# Patient Record
Sex: Male | Born: 1955 | Race: White | Hispanic: No | Marital: Single | Smoking: Former smoker
Health system: Southern US, Community
[De-identification: ages and names within clinical notes are randomized; demographics above are authoritative.]

## PROBLEM LIST (undated history)

## (undated) ENCOUNTER — Ambulatory Visit: Admission: EM | Payer: Medicare Other | Source: Home / Self Care

## (undated) ENCOUNTER — Emergency Department (HOSPITAL_COMMUNITY): Discharge status: Absent without leave | Payer: BC Managed Care – PPO

## (undated) DIAGNOSIS — J45909 Unspecified asthma, uncomplicated: Secondary | ICD-10-CM

## (undated) DIAGNOSIS — K602 Anal fissure, unspecified: Secondary | ICD-10-CM

## (undated) DIAGNOSIS — I509 Heart failure, unspecified: Secondary | ICD-10-CM

## (undated) DIAGNOSIS — E785 Hyperlipidemia, unspecified: Secondary | ICD-10-CM

## (undated) DIAGNOSIS — J40 Bronchitis, not specified as acute or chronic: Secondary | ICD-10-CM

## (undated) DIAGNOSIS — T7840XA Allergy, unspecified, initial encounter: Secondary | ICD-10-CM

## (undated) DIAGNOSIS — R0600 Dyspnea, unspecified: Secondary | ICD-10-CM

## (undated) DIAGNOSIS — J189 Pneumonia, unspecified organism: Secondary | ICD-10-CM

## (undated) DIAGNOSIS — D126 Benign neoplasm of colon, unspecified: Secondary | ICD-10-CM

## (undated) DIAGNOSIS — K219 Gastro-esophageal reflux disease without esophagitis: Secondary | ICD-10-CM

## (undated) DIAGNOSIS — I10 Essential (primary) hypertension: Secondary | ICD-10-CM

## (undated) DIAGNOSIS — M1990 Unspecified osteoarthritis, unspecified site: Secondary | ICD-10-CM

## (undated) DIAGNOSIS — Z9981 Dependence on supplemental oxygen: Secondary | ICD-10-CM

## (undated) DIAGNOSIS — Z950 Presence of cardiac pacemaker: Secondary | ICD-10-CM

## (undated) DIAGNOSIS — I428 Other cardiomyopathies: Secondary | ICD-10-CM

## (undated) DIAGNOSIS — J439 Emphysema, unspecified: Secondary | ICD-10-CM

## (undated) DIAGNOSIS — G4733 Obstructive sleep apnea (adult) (pediatric): Secondary | ICD-10-CM

## (undated) DIAGNOSIS — J449 Chronic obstructive pulmonary disease, unspecified: Secondary | ICD-10-CM

## (undated) DIAGNOSIS — Z8719 Personal history of other diseases of the digestive system: Secondary | ICD-10-CM

## (undated) DIAGNOSIS — J329 Chronic sinusitis, unspecified: Secondary | ICD-10-CM

## (undated) DIAGNOSIS — R0602 Shortness of breath: Secondary | ICD-10-CM

## (undated) HISTORY — DX: Unspecified asthma, uncomplicated: J45.909

## (undated) HISTORY — DX: Benign neoplasm of colon, unspecified: D12.6

## (undated) HISTORY — DX: Pneumonia, unspecified organism: J18.9

## (undated) HISTORY — DX: Shortness of breath: R06.02

## (undated) HISTORY — DX: Chronic sinusitis, unspecified: J32.9

## (undated) HISTORY — DX: Emphysema, unspecified: J43.9

## (undated) HISTORY — DX: Essential (primary) hypertension: I10

## (undated) HISTORY — DX: Obstructive sleep apnea (adult) (pediatric): G47.33

## (undated) HISTORY — PX: TONSILLECTOMY: SUR1361

## (undated) HISTORY — DX: Hyperlipidemia, unspecified: E78.5

## (undated) HISTORY — DX: Bronchitis, not specified as acute or chronic: J40

## (undated) HISTORY — DX: Chronic obstructive pulmonary disease, unspecified: J44.9

## (undated) HISTORY — DX: Anal fissure, unspecified: K60.2

## (undated) HISTORY — DX: Allergy, unspecified, initial encounter: T78.40XA

## (undated) HISTORY — DX: Gastro-esophageal reflux disease without esophagitis: K21.9

---

## 1999-05-03 ENCOUNTER — Inpatient Hospital Stay (HOSPITAL_COMMUNITY): Admission: AD | Admit: 1999-05-03 | Discharge: 1999-05-04 | Payer: Self-pay | Admitting: Cardiovascular Disease

## 1999-05-04 ENCOUNTER — Encounter: Payer: Self-pay | Admitting: Cardiovascular Disease

## 2002-02-20 ENCOUNTER — Ambulatory Visit (HOSPITAL_COMMUNITY): Admission: RE | Admit: 2002-02-20 | Discharge: 2002-02-20 | Payer: Self-pay | Admitting: Family Medicine

## 2002-02-20 ENCOUNTER — Encounter: Payer: Self-pay | Admitting: Family Medicine

## 2003-02-22 ENCOUNTER — Encounter: Payer: Self-pay | Admitting: Emergency Medicine

## 2003-02-22 ENCOUNTER — Emergency Department (HOSPITAL_COMMUNITY): Admission: EM | Admit: 2003-02-22 | Discharge: 2003-02-22 | Payer: Self-pay | Admitting: Emergency Medicine

## 2003-09-28 ENCOUNTER — Emergency Department (HOSPITAL_COMMUNITY): Admission: EM | Admit: 2003-09-28 | Discharge: 2003-09-28 | Payer: Self-pay | Admitting: Internal Medicine

## 2004-03-15 ENCOUNTER — Emergency Department (HOSPITAL_COMMUNITY): Admission: AD | Admit: 2004-03-15 | Discharge: 2004-03-15 | Payer: Self-pay | Admitting: Family Medicine

## 2005-06-20 ENCOUNTER — Emergency Department (HOSPITAL_COMMUNITY): Admission: EM | Admit: 2005-06-20 | Discharge: 2005-06-20 | Payer: Self-pay | Admitting: *Deleted

## 2007-12-15 ENCOUNTER — Emergency Department (HOSPITAL_COMMUNITY): Admission: EM | Admit: 2007-12-15 | Discharge: 2007-12-15 | Payer: Self-pay | Admitting: Emergency Medicine

## 2007-12-27 ENCOUNTER — Emergency Department (HOSPITAL_COMMUNITY): Admission: EM | Admit: 2007-12-27 | Discharge: 2007-12-27 | Payer: Self-pay | Admitting: Family Medicine

## 2008-01-19 ENCOUNTER — Emergency Department (HOSPITAL_COMMUNITY): Admission: EM | Admit: 2008-01-19 | Discharge: 2008-01-19 | Payer: Self-pay | Admitting: Family Medicine

## 2008-05-22 ENCOUNTER — Emergency Department (HOSPITAL_COMMUNITY): Admission: EM | Admit: 2008-05-22 | Discharge: 2008-05-22 | Payer: Self-pay | Admitting: Emergency Medicine

## 2008-06-01 ENCOUNTER — Emergency Department (HOSPITAL_COMMUNITY): Admission: EM | Admit: 2008-06-01 | Discharge: 2008-06-01 | Payer: Self-pay | Admitting: Emergency Medicine

## 2008-11-07 ENCOUNTER — Emergency Department (HOSPITAL_COMMUNITY): Admission: EM | Admit: 2008-11-07 | Discharge: 2008-11-07 | Payer: Self-pay | Admitting: Family Medicine

## 2009-05-13 ENCOUNTER — Emergency Department (HOSPITAL_COMMUNITY): Admission: EM | Admit: 2009-05-13 | Discharge: 2009-05-13 | Payer: Self-pay | Admitting: Family Medicine

## 2010-08-19 ENCOUNTER — Emergency Department (HOSPITAL_COMMUNITY): Admission: EM | Admit: 2010-08-19 | Discharge: 2010-08-19 | Payer: Self-pay | Admitting: Family Medicine

## 2011-07-01 ENCOUNTER — Ambulatory Visit (INDEPENDENT_AMBULATORY_CARE_PROVIDER_SITE_OTHER): Payer: Self-pay

## 2011-07-01 ENCOUNTER — Inpatient Hospital Stay (INDEPENDENT_AMBULATORY_CARE_PROVIDER_SITE_OTHER)
Admission: RE | Admit: 2011-07-01 | Discharge: 2011-07-01 | Disposition: A | Discharge status: Home / Self Care | Payer: Self-pay | Source: Ambulatory Visit | Attending: Family Medicine | Admitting: Family Medicine

## 2011-07-01 DIAGNOSIS — J449 Chronic obstructive pulmonary disease, unspecified: Secondary | ICD-10-CM

## 2011-07-01 DIAGNOSIS — J40 Bronchitis, not specified as acute or chronic: Secondary | ICD-10-CM

## 2011-07-01 LAB — POCT I-STAT, CHEM 8
BUN: 9 mg/dL (ref 6–23)
Calcium, Ion: 1.1 mmol/L — ABNORMAL LOW (ref 1.12–1.32)
Chloride: 103 mEq/L (ref 96–112)
Creatinine, Ser: 0.7 mg/dL (ref 0.50–1.35)
Glucose, Bld: 105 mg/dL — ABNORMAL HIGH (ref 70–99)
HCT: 52 % (ref 39.0–52.0)
Hemoglobin: 17.7 g/dL — ABNORMAL HIGH (ref 13.0–17.0)
Potassium: 3.6 mEq/L (ref 3.5–5.1)
Sodium: 139 mEq/L (ref 135–145)
TCO2: 27 mmol/L (ref 0–100)

## 2011-07-22 ENCOUNTER — Encounter: Payer: Self-pay | Admitting: Cardiovascular Disease

## 2011-07-23 ENCOUNTER — Ambulatory Visit (INDEPENDENT_AMBULATORY_CARE_PROVIDER_SITE_OTHER): Payer: Self-pay | Admitting: Cardiovascular Disease

## 2011-07-23 ENCOUNTER — Encounter: Payer: Self-pay | Admitting: Cardiovascular Disease

## 2011-07-23 DIAGNOSIS — R079 Chest pain, unspecified: Secondary | ICD-10-CM

## 2011-07-23 DIAGNOSIS — R0989 Other specified symptoms and signs involving the circulatory and respiratory systems: Secondary | ICD-10-CM

## 2011-07-23 DIAGNOSIS — F17200 Nicotine dependence, unspecified, uncomplicated: Secondary | ICD-10-CM

## 2011-07-23 DIAGNOSIS — R0600 Dyspnea, unspecified: Secondary | ICD-10-CM

## 2011-07-23 DIAGNOSIS — J45909 Unspecified asthma, uncomplicated: Secondary | ICD-10-CM | POA: Insufficient documentation

## 2011-07-23 DIAGNOSIS — I10 Essential (primary) hypertension: Secondary | ICD-10-CM

## 2011-07-23 MED ORDER — VARENICLINE TARTRATE 1 MG PO TABS: ORAL_TABLET | ORAL | Status: DC

## 2011-07-23 NOTE — Assessment & Plan Note (Signed)
Well controlled.  Continue current medications and low sodium Dash type diet.    

## 2011-07-23 NOTE — Patient Instructions (Signed)
Your physician recommends that you schedule a follow-up appointment in: . 6 MONTHS WITH DR [facility]  Your physician recommends that you continue on your current medications as directed. Please refer to the Current Medication list given to you today.  Your physician has requested that you have a lexiscan myoview. For further information please visit [email or web address] Please follow instruction sheet, as given. ALL AT PT'S CONVENIENCE  Your physician has requested that you have an echocardiogram. Echocardiography is a painless test that uses sound waves to create images of your heart. It provides your doctor with information about the size and shape of your heart and how well your heart's chambers and valves are working. This procedure takes approximately one hour. There are no restrictions for this procedure.  Your physician has recommended that you have a pulmonary function test. Pulmonary Function Tests are a group of tests that measure how well air moves in and out of your lungs.  You have been referred to PULMONARY DX COPD     AND DR Dwana Melena [place]  PMD

## 2011-07-23 NOTE — Assessment & Plan Note (Addendum)
COPD, needs PFT;s and pulmonary F/U.  Worrisome in regard to polycythemia.  Check echo for RV/LV function

## 2011-07-23 NOTE — Assessment & Plan Note (Signed)
Script for Chantix given  He will fill it at pharmacy if not too expensive

## 2011-07-23 NOTE — Progress Notes (Signed)
55 yo referred by Dr Day for SSCP, and dyspnea.  Reviewed urgent care notes CDU.  R/O no stress test done.  Unable to use treadmill due to knee problems.  SSCP last few months but mostly related to dyspnea and tightness from COPD.  Still smoking at least a ppd.  Needs pulmonary referral.  Had some relief in ER with nebulizer.  Counseled for more than 10 minutes on cessation.  Has had severe headache with welbutrin in past.  Will take script for Chantix but may not be able to afford it due to no insurance.  Has some assistance through [facility] with "[employer]" card  Had myovue in 20 that was ok.  No palpitatons, syncope,  Mild cough.  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 Noted to be polycythemic in ER with Hct >50.  CXR NAD no CA  ROS: Denies fever, malais, weight loss, blurry vision, decreased visual acuity, cough, sputum, SOB, hemoptysis, pleuritic pain, palpitaitons, heartburn, abdominal pain, melena, lower extremity edema, claudication, or rash.  All other systems reviewed and negative  General: Affect appropriate Healthy:  appears stated age HEENT: normal Neck supple with no adenopathy JVP normal no bruits no thyromegaly Lungs clear with no wheezing and good diaphragmatic motion Heart:  S1/S2 no murmur,rub, gallop or click PMI normal Abdomen: benighn, BS positve, no tenderness, no AAA no bruit.  No HSM or HJR Distal pulses intact with no bruits No edema Neuro non-focal Skin warm and dry No muscular weakness   Current Outpatient Prescriptions  Medication Sig Dispense Refill  . albuterol (PROAIR HFA) 108 (90 BASE) MCG/ACT inhaler Inhale 2 puffs into the lungs every 6 (six) hours as needed.        Marland Kitchen amLODipine (NORVASC) 10 MG tablet Take 10 mg by mouth daily.        Marland Kitchen DOXYCYCLINE HYCLATE PO Take 1 tablet by mouth daily.        . Fluticasone-Salmeterol (ADVAIR DISKUS) 100-50 MCG/DOSE AEPB Inhale 1 puff into the lungs every 12 (twelve) hours.        Marland Kitchen omeprazole (PRILOSEC) 20 MG  capsule Take 20 mg by mouth daily.        . varenicline (CHANTIX) 1 MG tablet AS DIRECTED  60 tablet  3    Allergies  E-mycin and Penicillins  Electrocardiogram:  NSR nonspecfic ST/T wave changes no RV strain  Assessment and Plan

## 2011-07-23 NOTE — Assessment & Plan Note (Signed)
Hard to differentiate form pulmonary symptoms.  Lexiscan myovue.  Cannot walk on treadmill due to knee pain

## 2011-08-10 ENCOUNTER — Ambulatory Visit (HOSPITAL_COMMUNITY): Payer: Self-pay | Attending: Cardiovascular Disease | Admitting: Radiology

## 2011-08-10 ENCOUNTER — Ambulatory Visit (HOSPITAL_BASED_OUTPATIENT_CLINIC_OR_DEPARTMENT_OTHER): Payer: Self-pay | Admitting: Radiology

## 2011-08-10 ENCOUNTER — Ambulatory Visit (INDEPENDENT_AMBULATORY_CARE_PROVIDER_SITE_OTHER): Payer: Self-pay | Admitting: Internal Medicine

## 2011-08-10 DIAGNOSIS — R079 Chest pain, unspecified: Secondary | ICD-10-CM | POA: Insufficient documentation

## 2011-08-10 DIAGNOSIS — R0602 Shortness of breath: Secondary | ICD-10-CM

## 2011-08-10 DIAGNOSIS — R0609 Other forms of dyspnea: Secondary | ICD-10-CM | POA: Insufficient documentation

## 2011-08-10 DIAGNOSIS — R0600 Dyspnea, unspecified: Secondary | ICD-10-CM

## 2011-08-10 DIAGNOSIS — R0989 Other specified symptoms and signs involving the circulatory and respiratory systems: Secondary | ICD-10-CM

## 2011-08-10 DIAGNOSIS — R0789 Other chest pain: Secondary | ICD-10-CM

## 2011-08-10 LAB — PULMONARY FUNCTION TEST

## 2011-08-10 MED ORDER — REGADENOSON 0.4 MG/5ML IV SOLN
0.4000 mg | Freq: Once | INTRAVENOUS | Status: AC
  Administered 2011-08-10: 0.4 mg via INTRAVENOUS

## 2011-08-10 MED ORDER — TECHNETIUM TC 99M TETROFOSMIN IV KIT
33.0000 | PACK | Freq: Once | INTRAVENOUS | Status: AC | PRN
  Administered 2011-08-10: 33 via INTRAVENOUS

## 2011-08-10 MED ORDER — TECHNETIUM TC 99M TETROFOSMIN IV KIT
11.0000 | PACK | Freq: Once | INTRAVENOUS | Status: AC | PRN
  Administered 2011-08-10: 11 via INTRAVENOUS

## 2011-08-10 NOTE — Progress Notes (Signed)
[facility] SITE 3 NUCLEAR MED [address] [place] [address] [phone]  Cardiology Nuclear Med Study  Robert Church is a 55 y.o. male [id number] 28-Nov-1956   Nuclear Med Background Indication for Stress Test:  Evaluation for Ischemia History:  '94 MPS:OK per patient Cardiac Risk Factors: Hypertension and Smoker  Symptoms:  Chest Tightness with and without Exertion (last episode of chest discomfort was about 1-week ago), DOE/SOB and Fatigue    Nuclear Pre-Procedure Caffeine/Decaff Intake:  8:30pm NPO After: 8:30pm   Lungs:  Clear.  O2 sat 98% on RA. IV 0.9% NS with Angio Cath:  20g  IV Site: R Hand  IV Started by:  Cathlyn Parsons, RN  Chest Size (in):  46 Cup Size: n/a  Height: 5\' 10"  (1.778 m)  Weight:  203 lb (92.08 kg)  BMI:  Body mass index is 29.13 kg/(m^2). Tech Comments:  Patient used ProAir @ 7:30 a.m., lungs clear and O2sat 100%    Nuclear Med Study 1 or 2 day study: 1 day  Stress Test Type:  Treadmill/Lexiscan  [place] MD: Kristeen Miss, MD  Order Authorizing Provider:  Charlton Haws, MD  Resting Radionuclide: Technetium 15m Tetrofosmin  Resting Radionuclide Dose: 11.0 mCi   Stress Radionuclide:  Technetium 42m Tetrofosmin  Stress Radionuclide Dose: 33.0 mCi           Stress Protocol Rest HR: 75 Stress HR: 118  Rest BP: 136/99 Stress BP: 154/75  Exercise Time (min): 2:00 METS: n/a   Predicted Max HR: 168 bpm % Max HR: 70.24 bpm Rate Pressure Product: 41324   Dose of Adenosine (mg):  n/a Dose of Lexiscan: 0.4 mg  Dose of Atropine (mg): n/a Dose of Dobutamine: n/a mcg/kg/min (at max HR)  Stress Test Technologist: Smiley Houseman, CMA-N  Nuclear Technologist:  Doyne Keel, CNMT     Rest Procedure:  Myocardial perfusion imaging was performed at rest 45 minutes following the intravenous administration of Technetium 53m Tetrofosmin.  Rest ECG: No acute changes.  Stress Procedure:  The patient received IV Lexiscan 0.4 mg over  15-seconds with concurrent low level exercise and then Technetium 23m Tetrofosmin was injected at 30-seconds while the patient continued walking one more minute.  There were no significant changes with Lexiscan, isolated PVC.  Quantitative spect images were obtained after a 45-minute delay.  Stress ECG: No significant change from baseline ECG  QPS Raw Data Images:  Normal; no motion artifact; normal heart/lung ratio. Stress Images:  There is a small area of severe attenuation of the apex with normal perfusion in the other aspects of the LV. Rest Images:  There is a very small area of moderate attenuaion of the apex with normal uptake in the other areas of the LV. Subtraction (SDS):  There is small area of fixed attenuation of the apex with a small area of redistribution surrounding this area of fixed attenuation. (SDS =6) Transient Ischemic Dilatation (Normal <1.22):  0.89 Lung/Heart Ratio (Normal <0.45):  0.31  Quantitative Gated Spect Images QGS EDV:  151 ml QGS ESV:  69 ml QGS cine images:  NL LV Function; NL Wall Motion.  In particular, the apex contracts normally QGS EF: 54%  Impression Exercise Capacity:  Lexiscan with low level exercise. BP Response:  Normal blood pressure response. Clinical Symptoms:  No chest pain. ECG Impression:  No significant ST segment change suggestive of ischemia. Comparison with Prior Nuclear Study: No images to compare  Overall Impression:  Abnormal stress nuclear study.  There  is appears to be an apical infarct with a small area of peri-infarct ischemia.  However, the LV function is normal and the apex contracts normally which suggests that this is not an infarct.  I cannot exclude the possibility of apical thinning to explain the defect since the data are somewhat conflicting.    Vesta Mixer, Montez Hageman., MD, [facility]

## 2011-08-10 NOTE — Progress Notes (Signed)
PFT done today. 

## 2011-08-11 ENCOUNTER — Encounter (HOSPITAL_COMMUNITY): Payer: Self-pay | Admitting: Cardiovascular Disease

## 2011-08-12 NOTE — Progress Notes (Signed)
pt aware of results Robert Church  

## 2011-08-17 ENCOUNTER — Other Ambulatory Visit: Payer: Self-pay

## 2011-08-17 ENCOUNTER — Ambulatory Visit (INDEPENDENT_AMBULATORY_CARE_PROVIDER_SITE_OTHER): Payer: Self-pay | Admitting: Emergency Medicine

## 2011-08-17 ENCOUNTER — Encounter: Payer: Self-pay | Admitting: Emergency Medicine

## 2011-08-17 VITALS — BP 120/84 | HR 90 | Temp 98.3°F | Ht 70.5 in | Wt 207.4 lb

## 2011-08-17 DIAGNOSIS — J449 Chronic obstructive pulmonary disease, unspecified: Secondary | ICD-10-CM

## 2011-08-17 NOTE — Patient Instructions (Signed)
We will Start Advair 250/50, 1 inhalation twice a day Use ProAir as needed Walking oximetry today Bloodwork today Follow up with Dr Delton Coombes in 1 month

## 2011-08-17 NOTE — Assessment & Plan Note (Signed)
Mild AFL by PFT, positive BD response.  - probable COPD - will need to address tobacco cessation in more detail next time' - trial Advair - prn SABA - walking oximetry today - rov 1 month

## 2011-08-17 NOTE — Progress Notes (Signed)
Subjective:    Patient ID: Robert Church, male    DOB: 02-17-1956, 55 y.o.   MRN: [id number]  HPI 55 yo man, tobacco use ~38 yrs (>120 pk-yrs), also HTN. No hx asthma. Was evaluated by Dr Eden Emms and underwent cardiac stress testing that was reassuring.  Describes longstanding exertional SOB that has worsened over the last 2 yrs. He has occas cough. He does hear wheezing. He has had a SABA that he uses a few times a day. He has also been on Advair, symbicort before. He has been exposed to asbestos through [employer] on cars. He has had many exacerbations of apparent COPD, treated with pred/abx. Never hospitalized, never intubated.    Review of Systems  Constitutional: Positive for unexpected weight change. Negative for fever.  HENT: Positive for congestion. Negative for ear pain, nosebleeds, sore throat, rhinorrhea, sneezing, trouble swallowing, dental problem, postnasal drip and sinus pressure.   Eyes: Negative for redness and itching.  Respiratory: Positive for cough and shortness of breath. Negative for chest tightness and wheezing.   Cardiovascular: Negative for palpitations and leg swelling.  Gastrointestinal: Negative for nausea and vomiting.  Genitourinary: Negative for dysuria.  Musculoskeletal: Negative for joint swelling.  Skin: Negative for rash.  Neurological: Negative for headaches.  Hematological: Does not bruise/bleed easily.  Psychiatric/Behavioral: Positive for dysphoric mood. The patient is not nervous/anxious.    Past Medical History  Diagnosis Date  . COPD (chronic obstructive pulmonary disease)   . SOB (shortness of breath)   . Bronchitis   . GERD (gastroesophageal reflux disease)   . Hypertension   . Pneumonia   . Sinusitis      Family History  Problem Relation Age of Onset  . Lung cancer Father   . Brain cancer Father      History   Social History  . Marital Status: Married    Spouse Name: N/A    Number of Children: N/A  . Years of Education: N/A    Occupational History  . Not on file.   Social History Main Topics  . Smoking status: Current Everyday Smoker -- 0.3 packs/day for 40 years    Types: Cigarettes  . Smokeless tobacco: Never Used   Comment: previous smoked up to 5 ppd, average 1.5 ppd  . Alcohol Use: Yes     rare  . Drug Use: No  . Sexually Active: Not on file   Other Topics Concern  . Not on file   Social History Narrative  . No narrative on file     Allergies  Allergen Reactions  . E-Mycin (Erythromycin Base)   . Penicillins      Outpatient Prescriptions Prior to Visit  Medication Sig Dispense Refill  . albuterol (PROAIR HFA) 108 (90 BASE) MCG/ACT inhaler Inhale 2 puffs into the lungs every 6 (six) hours as needed.        Marland Kitchen amLODipine (NORVASC) 10 MG tablet Take 10 mg by mouth daily.        Marland Kitchen omeprazole (PRILOSEC) 20 MG capsule Take 20 mg by mouth daily.        Marland Kitchen DOXYCYCLINE HYCLATE PO Take 1 tablet by mouth daily.        . Fluticasone-Salmeterol (ADVAIR DISKUS) 100-50 MCG/DOSE AEPB Inhale 1 puff into the lungs every 12 (twelve) hours.        . varenicline (CHANTIX) 1 MG tablet AS DIRECTED  60 tablet  3        Objective:   Physical Exam  Gen: Pleasant,  well-nourished, in no distress,  normal affect  ENT: No lesions,  mouth clear,  Some exp stridor  Neck: No JVD, no TMG, no carotid bruits  Lungs: No use of accessory muscles, some referred UA noise and expiratory wheezes.   Cardiovascular: RRR, heart sounds normal, no murmur or gallops, no peripheral edema  Musculoskeletal: No deformities, no cyanosis or clubbing  Neuro: alert, non focal  Skin: Warm, no lesions or rashes     Assessment & Plan:  COPD (chronic obstructive pulmonary disease) Mild AFL by PFT, positive BD response.  - probable COPD - will need to address tobacco cessation in more detail next time' - trial Advair - prn SABA - walking oximetry today - rov 1 month

## 2011-08-18 ENCOUNTER — Encounter: Payer: Self-pay | Admitting: Cardiovascular Disease

## 2011-09-18 ENCOUNTER — Telehealth: Payer: Self-pay | Admitting: Emergency Medicine

## 2011-09-18 ENCOUNTER — Ambulatory Visit (INDEPENDENT_AMBULATORY_CARE_PROVIDER_SITE_OTHER): Payer: Self-pay | Admitting: Emergency Medicine

## 2011-09-18 ENCOUNTER — Encounter: Payer: Self-pay | Admitting: Emergency Medicine

## 2011-09-18 DIAGNOSIS — F17200 Nicotine dependence, unspecified, uncomplicated: Secondary | ICD-10-CM

## 2011-09-18 DIAGNOSIS — J449 Chronic obstructive pulmonary disease, unspecified: Secondary | ICD-10-CM

## 2011-09-18 NOTE — Progress Notes (Signed)
  Subjective:    Patient ID: Robert Church, male    DOB: 1956-05-12, 55 y.o.   MRN: [id number] HPI 55 yo man, tobacco use ~38 yrs (>120 pk-yrs), also HTN. No hx asthma. Was evaluated by Dr Eden Emms and underwent cardiac stress testing that was reassuring.  Describes longstanding exertional SOB that has worsened over the last 2 yrs. He has occas cough. He does hear wheezing. He has had a SABA that he uses a few times a day. He has also been on Advair, symbicort before. He has been exposed to asbestos through [employer] on cars. He has had many exacerbations of apparent COPD, treated with pred/abx. Never hospitalized, never intubated.   ROV 09/18/11 -- heavy tobacco hx, COPD/asthma based on PFTs. Last time initiated trial Advair. CXR from 06/2011 with hyperinflation.  He feels that the Advair may be helping but only a little bit, no real change.  Takes ProAir at least 3 x a day. Still smoking 1/2 pk a day.      Objective:   Physical Exam  Gen: Pleasant, well-nourished, in no distress,  normal affect  ENT: No lesions,  mouth clear,  Some exp stridor  Neck: No JVD, no TMG, no carotid bruits  Lungs: No use of accessory muscles, some referred UA noise and expiratory wheezes.   Cardiovascular: RRR, heart sounds normal, no murmur or gallops, no peripheral edema  Musculoskeletal: No deformities, no cyanosis or clubbing  Neuro: alert, non focal  Skin: Warm, no lesions or rashes     Assessment & Plan:  Smoking Trying to cut down.  He has not been able to tolerate chantix or wellbutrin. Has used patches in past. Not ready to set quit date at this time.   COPD (chronic obstructive pulmonary disease) Would continue Advair and prn SABA for now. Not clear to me that he is ready to quit cigarettes.  - continue same.

## 2011-09-18 NOTE — Assessment & Plan Note (Signed)
Would continue Advair and prn SABA for now. Not clear to me that he is ready to quit cigarettes.  - continue same.

## 2011-09-18 NOTE — Patient Instructions (Signed)
Please continue your Advair twice a day.  Use ProAir as needed.  We need to work on stopping smoking. Please call our office if you are ready and we can help you stop.  Follow with Dr Delton Coombes in 6 months or sooner if you have any problems.

## 2011-09-18 NOTE — Assessment & Plan Note (Signed)
Trying to cut down.  He has not been able to tolerate chantix or wellbutrin. Has used patches in past. Not ready to set quit date at this time.

## 2011-09-23 MED ORDER — FLUTICASONE-SALMETEROL 100-50 MCG/DOSE IN AEPB: 1.0000 | INHALATION_SPRAY | Freq: Two times a day (BID) | RESPIRATORY_TRACT | Status: DC

## 2011-09-23 NOTE — Telephone Encounter (Signed)
RX printed and awaiting RB signature so forms can be sent out.

## 2011-09-24 NOTE — Telephone Encounter (Signed)
Advair assistance forms given to [facility] along with RX and this will be sent out for the patient.

## 2011-10-25 ENCOUNTER — Emergency Department (HOSPITAL_COMMUNITY): Payer: Self-pay

## 2011-10-25 ENCOUNTER — Encounter (HOSPITAL_COMMUNITY): Payer: Self-pay | Admitting: Emergency Medicine

## 2011-10-25 ENCOUNTER — Emergency Department (HOSPITAL_COMMUNITY)
Admission: EM | Admit: 2011-10-25 | Discharge: 2011-10-25 | Disposition: A | Discharge status: Home / Self Care | Payer: Self-pay | Attending: Emergency Medicine | Admitting: Emergency Medicine

## 2011-10-25 DIAGNOSIS — J4489 Other specified chronic obstructive pulmonary disease: Secondary | ICD-10-CM | POA: Insufficient documentation

## 2011-10-25 DIAGNOSIS — M25569 Pain in unspecified knee: Secondary | ICD-10-CM | POA: Insufficient documentation

## 2011-10-25 DIAGNOSIS — I10 Essential (primary) hypertension: Secondary | ICD-10-CM | POA: Insufficient documentation

## 2011-10-25 DIAGNOSIS — M25461 Effusion, right knee: Secondary | ICD-10-CM

## 2011-10-25 DIAGNOSIS — K219 Gastro-esophageal reflux disease without esophagitis: Secondary | ICD-10-CM | POA: Insufficient documentation

## 2011-10-25 DIAGNOSIS — F17200 Nicotine dependence, unspecified, uncomplicated: Secondary | ICD-10-CM | POA: Insufficient documentation

## 2011-10-25 DIAGNOSIS — R609 Edema, unspecified: Secondary | ICD-10-CM | POA: Insufficient documentation

## 2011-10-25 DIAGNOSIS — J449 Chronic obstructive pulmonary disease, unspecified: Secondary | ICD-10-CM | POA: Insufficient documentation

## 2011-10-25 DIAGNOSIS — M25469 Effusion, unspecified knee: Secondary | ICD-10-CM | POA: Insufficient documentation

## 2011-10-25 DIAGNOSIS — Z79899 Other long term (current) drug therapy: Secondary | ICD-10-CM | POA: Insufficient documentation

## 2011-10-25 MED ORDER — HYDROCODONE-ACETAMINOPHEN 5-325 MG PO TABS: 1.0000 | ORAL_TABLET | ORAL | Status: AC | PRN

## 2011-10-25 MED ORDER — IBUPROFEN 800 MG PO TABS: 800.0000 mg | ORAL_TABLET | Freq: Three times a day (TID) | ORAL | Status: AC

## 2011-10-25 NOTE — ED Notes (Signed)
Pt returns from xray

## 2011-10-25 NOTE — ED Provider Notes (Signed)
History     CSN: [id number] Arrival date & time: 10/25/2011  9:30 AM   First MD Initiated Contact with Patient 10/25/11 [phone]      Chief Complaint  Patient presents with  . Knee Pain    (Consider location/radiation/quality/duration/timing/severity/associated sxs/prior treatment) Patient is a 55 y.o. male presenting with knee pain. The history is provided by the patient.  Knee Pain This is a new problem. The current episode started 1 to 4 weeks ago. The problem occurs constantly. The problem has been unchanged. Associated symptoms include arthralgias and joint swelling. Pertinent negatives include no fever, myalgias, nausea, numbness, rash or weakness. The symptoms are aggravated by bending and walking. He has tried NSAIDs and ice for the symptoms. The treatment provided mild relief.  He noted pain to his R knee about 2 weeks ago. No known injury to the knee; he denies any old injuries. The pain worsens with bending the knee and walking a lot. He has used a knee brace at home as well as ice with little to no relief. He is able to walk and bear wt.  Past Medical History  Diagnosis Date  . COPD (chronic obstructive pulmonary disease)   . SOB (shortness of breath)   . Bronchitis   . GERD (gastroesophageal reflux disease)   . Hypertension   . Pneumonia   . Sinusitis     Past Surgical History  Procedure Date  . Tonsillectomy     Family History  Problem Relation Age of Onset  . Lung cancer Father   . Brain cancer Father     History  Substance Use Topics  . Smoking status: Current Everyday Smoker -- 0.5 packs/day for 40 years    Types: Cigarettes  . Smokeless tobacco: Never Used   Comment: previous smoked up to 5 ppd, average 1.5 ppd  . Alcohol Use: Yes     rare      Review of Systems  Constitutional: Negative.  Negative for fever.  Gastrointestinal: Negative.  Negative for nausea.  Musculoskeletal: Positive for joint swelling and arthralgias. Negative for myalgias and  gait problem.  Skin: Negative.  Negative for rash.  Neurological: Negative.  Negative for weakness and numbness.  All other systems reviewed and are negative.    Allergies  E-mycin and Penicillins  Home Medications   Current Outpatient Rx  Name Route Sig Dispense Refill  . ALBUTEROL SULFATE HFA 108 (90 BASE) MCG/ACT IN AERS Inhalation Inhale 2 puffs into the lungs every 6 (six) hours as needed. For shortness of breath.    . AMLODIPINE BESYLATE 10 MG PO TABS Oral Take 10 mg by mouth daily.     Marland Kitchen FLUTICASONE-SALMETEROL 250-50 MCG/DOSE IN AEPB Inhalation Inhale 1 puff into the lungs every 12 (twelve) hours.      . OMEPRAZOLE 20 MG PO CPDR Oral Take 20 mg by mouth daily.     Marland Kitchen OVER THE COUNTER MEDICATION Nasal Place 3-4 sprays into the nose every morning. Saline Solution.     Marland Kitchen HYDROCODONE-ACETAMINOPHEN 5-325 MG PO TABS Oral Take 1-2 tablets by mouth every 4 (four) hours as needed for pain. 10 tablet 0  . IBUPROFEN 800 MG PO TABS Oral Take 1 tablet (800 mg total) by mouth 3 (three) times daily. 21 tablet 0    Pulse 86  Temp(Src) 98.3 F (36.8 C) (Oral)  Resp 20  SpO2 99%  Physical Exam  Constitutional: He is oriented to person, place, and time. He appears well-developed and well-nourished. No distress.  HENT:  Head: Normocephalic and atraumatic.  Right Ear: External ear normal.  Left Ear: External ear normal.  Nose: Nose normal.  Eyes: Conjunctivae are normal. Pupils are equal, round, and reactive to light.  Neck: Neck supple.  Musculoskeletal: He exhibits edema. He exhibits no tenderness.       Right knee: He exhibits decreased range of motion and effusion. He exhibits no ecchymosis, no deformity, no LCL laxity, no bony tenderness, normal meniscus and no MCL laxity. tenderness found. No medial joint line and no lateral joint line tenderness noted.       Legs:      Neg ant/post drawer testing  Neurological: He is alert and oriented to person, place, and time. He displays normal  reflexes. No cranial nerve deficit.  Skin: Skin is warm and dry. No rash noted. He is not diaphoretic.  Psychiatric: He has a normal mood and affect.    ED Course  Procedures (including critical care time)  Labs Reviewed - No data to display Dg Knee Complete 4 Views Right  10/25/2011  *RADIOLOGY REPORT*  Clinical Data: Knee pain.  Swelling.  RIGHT KNEE - COMPLETE 4+ VIEW  Comparison: None.  Findings: Mild degenerative changes in the right knee.  Possible trace joint effusion. No acute bony abnormality.  Specifically, no fracture, subluxation, or dislocation.  Soft tissues are intact. Small well corticated bony density noted along the medial joint surface which may reflect old injury.  IMPRESSION: Mild degenerative changes.  Trace joint effusion. No acute bony abnormality.  Original Report Authenticated By: Cyndie Chime, M.D.     1. Knee effusion, right       MDM  Pt with effusion which feels mostly suprapatellar; patella is not ballotable. I offered to drain the effusion, but pt declined. I encouraged him to rest and use heat to the area as well as f/u with ortho. He verbalized understanding and agreed to plan.        Grant Fontana, [place] 10/25/11 [phone]

## 2011-10-25 NOTE — ED Notes (Signed)
Pt fell 2w ago and has had pain since, mild swelling noted

## 2011-10-28 NOTE — ED Provider Notes (Signed)
Medical screening examination/treatment/procedure(s) were performed by non-physician practitioner and as supervising physician I was immediately available for consultation/collaboration.  Nicholes Stairs, MD 10/28/11 [phone]

## 2011-11-23 ENCOUNTER — Telehealth: Payer: Self-pay | Admitting: Emergency Medicine

## 2011-11-23 MED ORDER — ALBUTEROL SULFATE HFA 108 (90 BASE) MCG/ACT IN AERS: 2.0000 | INHALATION_SPRAY | Freq: Four times a day (QID) | RESPIRATORY_TRACT | Status: DC | PRN

## 2011-11-23 NOTE — Telephone Encounter (Signed)
LMOM informing pt rx sent to pharmacy.   

## 2011-12-03 ENCOUNTER — Emergency Department (INDEPENDENT_AMBULATORY_CARE_PROVIDER_SITE_OTHER): Payer: Self-pay

## 2011-12-03 ENCOUNTER — Emergency Department (INDEPENDENT_AMBULATORY_CARE_PROVIDER_SITE_OTHER)
Admission: EM | Admit: 2011-12-03 | Discharge: 2011-12-03 | Disposition: A | Discharge status: Home / Self Care | Payer: Self-pay | Source: Home / Self Care | Attending: Emergency Medicine | Admitting: Emergency Medicine

## 2011-12-03 ENCOUNTER — Encounter (HOSPITAL_COMMUNITY): Payer: Self-pay | Admitting: Emergency Medicine

## 2011-12-03 DIAGNOSIS — S61209A Unspecified open wound of unspecified finger without damage to nail, initial encounter: Secondary | ICD-10-CM

## 2011-12-03 DIAGNOSIS — Z23 Encounter for immunization: Secondary | ICD-10-CM

## 2011-12-03 DIAGNOSIS — S61219A Laceration without foreign body of unspecified finger without damage to nail, initial encounter: Secondary | ICD-10-CM

## 2011-12-03 MED ORDER — BACITRACIN 500 UNIT/GM EX OINT
1.0000 "application " | TOPICAL_OINTMENT | Freq: Once | CUTANEOUS | Status: AC
  Administered 2011-12-03: 1 via TOPICAL

## 2011-12-03 MED ORDER — TETANUS-DIPHTH-ACELL PERTUSSIS 5-2.5-18.5 LF-MCG/0.5 IM SUSP
0.5000 mL | Freq: Once | INTRAMUSCULAR | Status: AC
  Administered 2011-12-03: 0.5 mL via INTRAMUSCULAR

## 2011-12-03 MED ORDER — TETANUS-DIPHTH-ACELL PERTUSSIS 5-2.5-18.5 LF-MCG/0.5 IM SUSP
INTRAMUSCULAR | Status: AC
  Filled 2011-12-03: qty 0.5

## 2011-12-03 MED ORDER — HYDROCODONE-ACETAMINOPHEN 5-325 MG PO TABS: 2.0000 | ORAL_TABLET | ORAL | Status: AC | PRN

## 2011-12-03 NOTE — ED Notes (Signed)
Applied antibiotic ointment, dressing, kerlix, coban

## 2011-12-03 NOTE — ED Notes (Signed)
Patient not sure of when he recieved a tetanus.

## 2011-12-03 NOTE — ED Notes (Signed)
Right finger slammed in car door.  Incident occurred around 2:00 pm today.  Patient reports continued to bleed.  Numbness/tingling in fingertip.

## 2011-12-03 NOTE — ED Provider Notes (Signed)
History     CSN: [id number] Arrival date & time: 12/03/2011  7:49 PM   First MD Initiated Contact with Patient 12/03/11 1925      No chief complaint on file.   (Consider location/radiation/quality/duration/timing/severity/associated sxs/prior treatment) HPI Comments: Finger slammed in car door, have a cut on my R index finger around 2 pm bled a lot feel numbness and tingling sensation   Patient is a 55 y.o. male presenting with skin laceration. The history is provided by the patient.  Laceration  The incident occurred 1 to 2 hours ago. The laceration is 3 cm in size. The pain is at a severity of 5/10. The pain is moderate. The pain has been constant since onset. He reports no foreign bodies present.    Past Medical History  Diagnosis Date  . COPD (chronic obstructive pulmonary disease)   . SOB (shortness of breath)   . Bronchitis   . GERD (gastroesophageal reflux disease)   . Hypertension   . Pneumonia   . Sinusitis     Past Surgical History  Procedure Date  . Tonsillectomy     Family History  Problem Relation Age of Onset  . Lung cancer Father   . Brain cancer Father     History  Substance Use Topics  . Smoking status: Current Everyday Smoker -- 0.5 packs/day for 40 years    Types: Cigarettes  . Smokeless tobacco: Never Used   Comment: previous smoked up to 5 ppd, average 1.5 ppd  . Alcohol Use: Yes     rare      Review of Systems  Musculoskeletal:       Laceration  Neurological: Negative for weakness and numbness.  All other systems reviewed and are negative.    Allergies  E-mycin and Penicillins  Home Medications   Current Outpatient Rx  Name Route Sig Dispense Refill  . ALBUTEROL SULFATE HFA 108 (90 BASE) MCG/ACT IN AERS Inhalation Inhale 2 puffs into the lungs every 6 (six) hours as needed. For shortness of breath. 1 Inhaler 11  . AMLODIPINE BESYLATE 10 MG PO TABS Oral Take 10 mg by mouth daily.     Marland Kitchen FLUTICASONE-SALMETEROL 250-50 MCG/DOSE  IN AEPB Inhalation Inhale 1 puff into the lungs every 12 (twelve) hours.      . OMEPRAZOLE 20 MG PO CPDR Oral Take 20 mg by mouth daily.     Marland Kitchen OVER THE COUNTER MEDICATION Nasal Place 3-4 sprays into the nose every morning. Saline Solution.       BP 168/100  Pulse 82  Temp(Src) 98.1 F (36.7 C) (Oral)  Resp 16  SpO2 98%  Physical Exam  Nursing note and vitals reviewed. Constitutional: He appears well-developed.  HENT:  Head: Normocephalic.  Musculoskeletal: He exhibits tenderness.       Right hand: He exhibits tenderness, laceration and swelling. He exhibits normal range of motion, normal two-point discrimination and normal capillary refill. normal sensation noted. Normal strength noted.       Hands:   ED Course  LACERATION REPAIR Date/Time: 12/03/2011 8:30 PM Performed by: Kylil Swopes Authorized by: Jimmie Molly Consent: Verbal consent obtained. Consent given by: patient Patient understanding: patient states understanding of the procedure being performed Patient identity confirmed: verbally with patient Laceration length: 3 cm Contamination: The wound is contaminated. Tendon involvement: none Nerve involvement: superficial Vascular damage: no Anesthesia: local infiltration Local anesthetic: lidocaine 1% without epinephrine Anesthetic total: 5 ml Amount of cleaning: standard Debridement: minimal Skin closure: 4-0 Prolene Technique: vertical mattress  Approximation: close Approximation difficulty: simple Dressing: antibiotic ointment   (including critical care time)  Labs Reviewed - No data to display No results found.   No diagnosis found.    MDM  Laceration palmar surface 3 cm- able to flex DIP        Jimmie Molly, MD 12/03/11 2033

## 2011-12-10 ENCOUNTER — Telehealth: Payer: Self-pay | Admitting: Emergency Medicine

## 2011-12-10 NOTE — Telephone Encounter (Signed)
I spoke with pt and he c/o nasal congestion, PND, little cough w/ clear phlem, wheezing, sore throat from PND x 2 days. Pt denies any fever, nausea, vomiting. Pt has tried Catering manager and vicks sinus. Pt is requesting that Dr. Delton Coombes call him something in. Please advise, thanks  Allergies  Allergen Reactions  . E-Mycin (Erythromycin Base) Swelling  . Penicillins Other (See Comments)    Pt took as a child and dr told him if he took again it would kill him.

## 2011-12-10 NOTE — Telephone Encounter (Signed)
This sounds like a viral URI as opposed to bacterial sinus dz - he should use OTC cold meds that include decongestants, start nasal saline washes daily if he is willing/able to do them. Will probably last 7-10 days. If he changes, worsens then he should call, probably be seen at OV.

## 2011-12-10 NOTE — Telephone Encounter (Signed)
Spoke with pt and notified of recs per RB.  He verbalized understanding and denied any questions.

## 2011-12-16 ENCOUNTER — Emergency Department (INDEPENDENT_AMBULATORY_CARE_PROVIDER_SITE_OTHER)
Admission: EM | Admit: 2011-12-16 | Discharge: 2011-12-16 | Disposition: A | Discharge status: Home / Self Care | Payer: Self-pay | Source: Home / Self Care

## 2011-12-16 ENCOUNTER — Encounter (HOSPITAL_COMMUNITY): Payer: Self-pay | Admitting: Emergency Medicine

## 2011-12-16 DIAGNOSIS — J441 Chronic obstructive pulmonary disease with (acute) exacerbation: Secondary | ICD-10-CM

## 2011-12-16 DIAGNOSIS — I10 Essential (primary) hypertension: Secondary | ICD-10-CM

## 2011-12-16 MED ORDER — IPRATROPIUM BROMIDE 0.02 % IN SOLN
0.5000 mg | Freq: Once | RESPIRATORY_TRACT | Status: AC
  Administered 2011-12-16: 0.5 mg via RESPIRATORY_TRACT

## 2011-12-16 MED ORDER — AMLODIPINE BESYLATE 10 MG PO TABS: 10.0000 mg | ORAL_TABLET | Freq: Every day | ORAL | Status: DC

## 2011-12-16 MED ORDER — ALBUTEROL SULFATE (5 MG/ML) 0.5% IN NEBU
5.0000 mg | INHALATION_SOLUTION | Freq: Once | RESPIRATORY_TRACT | Status: AC
  Administered 2011-12-16: 5 mg via RESPIRATORY_TRACT

## 2011-12-16 MED ORDER — ALBUTEROL SULFATE (5 MG/ML) 0.5% IN NEBU
INHALATION_SOLUTION | RESPIRATORY_TRACT | Status: AC
  Filled 2011-12-16: qty 1

## 2011-12-16 NOTE — ED Notes (Signed)
PT HERE WITH BRONCHITIS FLARE UP THAT STARTED X 4 DYS AGO WITH CHEST CONGESTION,COUGHING MAINLY IN AM CLEARED WITH PROD COUGH AND WHEEZING.PT HAS HX COPD,BRONCHITIS.LAST USED PRO-AIR,ADVAIR @ 7AM.DENIES SOB OR FEVERS.EXP WHEEZING THROUGHOUT

## 2011-12-16 NOTE — ED Provider Notes (Signed)
History     CSN: [id number]  Arrival date & time 12/16/11  5784   None     Chief Complaint  Patient presents with  . Bronchitis    (Consider location/radiation/quality/duration/timing/severity/associated sxs/prior treatment) HPI Comments: Pt presents with c/o cough, wheezing and dyspnea x 4 days. He has a hx of COPD. He states he gets bronchitis like this every yr. He has been on Advair for approx 4 mos now and is using Albuterol prn. Last used albuterol MDI  2 1/2 hrs ago. No fever or chills. Has chronic year round nasal congesion - unchanged. He also states he ran out of his BP medication a few days ago. He does no longer has a PCP so in unable to get refills. He recently had a cardiac evaluation by Dr Buzzy Han but does not have a follow up .    Past Medical History  Diagnosis Date  . COPD (chronic obstructive pulmonary disease)   . SOB (shortness of breath)   . Bronchitis   . GERD (gastroesophageal reflux disease)   . Hypertension   . Pneumonia   . Sinusitis     Past Surgical History  Procedure Date  . Tonsillectomy     Family History  Problem Relation Age of Onset  . Lung cancer Father   . Brain cancer Father     History  Substance Use Topics  . Smoking status: Current Everyday Smoker -- 0.5 packs/day for 40 years    Types: Cigarettes  . Smokeless tobacco: Never Used   Comment: previous smoked up to 5 ppd, average 1.5 ppd  . Alcohol Use: No     rare      Review of Systems  Constitutional: Negative for fever, chills and fatigue.  HENT: Positive for congestion. Negative for ear pain, sore throat, rhinorrhea, sneezing, postnasal drip and sinus pressure.   Respiratory: Positive for cough, shortness of breath and wheezing.   Cardiovascular: Negative for chest pain and palpitations.  Neurological: Negative for dizziness and headaches.    Allergies  E-mycin and Penicillins  Home Medications   Current Outpatient Rx  Name Route Sig Dispense Refill  .  ALBUTEROL SULFATE HFA 108 (90 BASE) MCG/ACT IN AERS Inhalation Inhale 2 puffs into the lungs every 6 (six) hours as needed. For shortness of breath. 1 Inhaler 11  . FLUTICASONE-SALMETEROL 250-50 MCG/DOSE IN AEPB Inhalation Inhale 1 puff into the lungs every 12 (twelve) hours.      Marland Kitchen OVER THE COUNTER MEDICATION Nasal Place 3-4 sprays into the nose every morning. Saline Solution.     . AMLODIPINE BESYLATE 10 MG PO TABS Oral Take 1 tablet (10 mg total) by mouth daily. RAN OUT X 1 WEEK.BP 167/122 30 tablet 0  . OMEPRAZOLE 20 MG PO CPDR Oral Take 20 mg by mouth daily.       BP 167/122  Pulse 84  Temp(Src) 98.4 F (36.9 C) (Oral)  Resp 17  SpO2 97%  Physical Exam  Nursing note and vitals reviewed. Constitutional: He appears well-developed and well-nourished. No distress.  HENT:  Head: Normocephalic and atraumatic.  Right Ear: Tympanic membrane, external ear and ear canal normal.  Left Ear: Tympanic membrane, external ear and ear canal normal.  Nose: Nose normal.  Mouth/Throat: Uvula is midline, oropharynx is clear and moist and mucous membranes are normal. No oropharyngeal exudate, posterior oropharyngeal edema or posterior oropharyngeal erythema.  Neck: Neck supple.  Cardiovascular: Normal rate, regular rhythm and normal heart sounds.   Pulmonary/Chest: Effort normal. No  respiratory distress. He has decreased breath sounds (bilat). He has wheezes (bilat). He has no rhonchi. He has no rales.  Lymphadenopathy:    He has no cervical adenopathy.  Neurological: He is alert.  Skin: Skin is warm and dry.  Psychiatric: He has a normal mood and affect.    ED Course  Procedures (including critical care time)  Labs Reviewed - No data to display No results found.   1. Acute exacerbation of chronic obstructive pulmonary disease (COPD)   2. Hypertension       MDM  After NMT pt reports symptomatic improvement and lungs CTA.  Discussed with pt need to obtain PCP for HTN mgmt . One mos rx  refill given.         Melody Comas, [place] 12/16/11 1946

## 2011-12-17 NOTE — ED Provider Notes (Signed)
Medical screening examination/treatment/procedure(s) were performed by non-physician practitioner and as supervising physician I was immediately available for consultation/collaboration.  Raynald Blend, MD 12/17/11 2020

## 2012-01-04 ENCOUNTER — Ambulatory Visit (INDEPENDENT_AMBULATORY_CARE_PROVIDER_SITE_OTHER): Payer: Self-pay

## 2012-01-04 DIAGNOSIS — R059 Cough, unspecified: Secondary | ICD-10-CM

## 2012-01-04 DIAGNOSIS — J441 Chronic obstructive pulmonary disease with (acute) exacerbation: Secondary | ICD-10-CM

## 2012-01-04 DIAGNOSIS — G4700 Insomnia, unspecified: Secondary | ICD-10-CM

## 2012-01-04 DIAGNOSIS — R05 Cough: Secondary | ICD-10-CM

## 2012-01-04 DIAGNOSIS — I10 Essential (primary) hypertension: Secondary | ICD-10-CM

## 2012-02-07 ENCOUNTER — Ambulatory Visit: Payer: Self-pay

## 2012-02-08 ENCOUNTER — Ambulatory Visit (INDEPENDENT_AMBULATORY_CARE_PROVIDER_SITE_OTHER): Payer: Self-pay | Admitting: Emergency Medicine

## 2012-02-08 ENCOUNTER — Encounter: Payer: Self-pay | Admitting: Emergency Medicine

## 2012-02-08 DIAGNOSIS — F17200 Nicotine dependence, unspecified, uncomplicated: Secondary | ICD-10-CM

## 2012-02-08 DIAGNOSIS — J449 Chronic obstructive pulmonary disease, unspecified: Secondary | ICD-10-CM

## 2012-02-08 NOTE — Assessment & Plan Note (Signed)
Down to 5 cig/day, discussed cessation, will need to revisit next time

## 2012-02-08 NOTE — Assessment & Plan Note (Addendum)
Continue Advair bid Start Spiriva x 1 month; he will call to let me know if beneficial. If so then we will order Need to try to get financial assist for ProAir and Spiriva, he already has for Advair Doesn't think that mucinex is helpful

## 2012-02-08 NOTE — Progress Notes (Signed)
  Subjective:    Patient ID: Robert Church, male    DOB: [birth year], 56 y.o.   MRN: [id number] HPI 56 yo man, tobacco use ~38 yrs (>120 pk-yrs), also HTN. No hx asthma. Was evaluated by Dr Eden Emms and underwent cardiac stress testing that was reassuring.  Describes longstanding exertional SOB that has worsened over the last 2 yrs. He has occas cough. He does hear wheezing. He has had a SABA that he uses a few times a day. He has also been on Advair, symbicort before. He has been exposed to asbestos through [employer] on cars. He has had many exacerbations of apparent COPD, treated with pred/abx. Never hospitalized, never intubated.   ROV 09/18/11 -- heavy tobacco hx, COPD/asthma based on PFTs. Last time initiated trial Advair. CXR from 06/2011 with hyperinflation.  He feels that the Advair may be helping but only a little bit, no real change.  Takes ProAir at least 3 x a day. Still smoking 1/2 pk a day.   ROV 02/08/12 -- tobacco, COPD/asthma. He is having more dyspnea than last time, always worst in the am, produces thick yellow phlegm every am. Now down to 5 cigarettes. Using ProAir 5-6x a day.      Objective:   Physical Exam  Gen: Pleasant, well-nourished, in no distress,  normal affect  ENT: No lesions,  mouth clear,  Some exp stridor  Neck: No JVD, no TMG, no carotid bruits  Lungs: No use of accessory muscles, some referred UA noise and expiratory wheezes.   Cardiovascular: RRR, heart sounds normal, no murmur or gallops, no peripheral edema  Musculoskeletal: No deformities, no cyanosis or clubbing  Neuro: alert, non focal  Skin: Warm, no lesions or rashes     Assessment & Plan:  COPD (chronic obstructive pulmonary disease) Continue Advair bid Start Spiriva x 1 month; he will call to let me know if beneficial. If so then we will order Need to try to get financial assist for ProAir and Spiriva, he already has for Advair Doesn't think that mucinex is helpful   Smoking Down to 5  cig/day, discussed cessation, will need to revisit next time

## 2012-02-08 NOTE — Patient Instructions (Signed)
Continue Advair twice a day Start Spiriva daily for the next month After a month, call our office to report how you are doing on the Spiriva. If it has been helpful then we will order for you  We will work on financial assistance for both Spiriva and ProAir You need to continue to work on stopping smoking. Congratulations for getting down to 5 cigarettes a day! Follow with Dr Delton Coombes in 6 months or sooner if you have any problems

## 2012-03-03 ENCOUNTER — Telehealth: Payer: Self-pay | Admitting: Emergency Medicine

## 2012-03-04 NOTE — Telephone Encounter (Signed)
Spoke with pt and advised that forms given to RB's nurse to complete and I have left him a couple of boxes of spiriva up front for pick up.

## 2012-03-08 MED ORDER — TIOTROPIUM BROMIDE MONOHYDRATE 18 MCG IN CAPS: 18.0000 ug | ORAL_CAPSULE | Freq: Every day | RESPIRATORY_TRACT | Status: DC

## 2012-03-08 NOTE — Telephone Encounter (Signed)
Forms are in RB look at awaiting signature.

## 2012-03-08 NOTE — Telephone Encounter (Signed)
Spoke with Lawson Fiscal and she reports forms are still waiting for Dr Delton Coombes to fill out.

## 2012-03-08 NOTE — Telephone Encounter (Signed)
signed

## 2012-03-09 ENCOUNTER — Ambulatory Visit (INDEPENDENT_AMBULATORY_CARE_PROVIDER_SITE_OTHER): Payer: Self-pay | Admitting: Emergency Medicine

## 2012-03-09 ENCOUNTER — Encounter: Payer: Self-pay | Admitting: Emergency Medicine

## 2012-03-09 VITALS — BP 138/84 | HR 74 | Temp 98.2°F | Ht 70.5 in | Wt 211.8 lb

## 2012-03-09 DIAGNOSIS — J449 Chronic obstructive pulmonary disease, unspecified: Secondary | ICD-10-CM

## 2012-03-09 NOTE — Telephone Encounter (Signed)
Forms have been signed and faxed with prescription. Forms sent for scanning. Will inform pt today at OV with Dr. Delton Coombes.

## 2012-03-09 NOTE — Assessment & Plan Note (Signed)
Plan continue spiriva, stop advair. He will call if he declines and needs to restart it SABA prn  rx the allergies.

## 2012-03-09 NOTE — Telephone Encounter (Signed)
Robert Fiscal, do you have these forms. Please advise thanks

## 2012-03-09 NOTE — Patient Instructions (Signed)
Please continue spiriva once daily Use your proAir as needed You need to stop smoking Use Zyrtec over-the-counter for allergies if needed Follow with Dr Delton Coombes in 2 months

## 2012-03-09 NOTE — Progress Notes (Signed)
  Subjective:    Patient ID: Robert Church, male    DOB: [birth year], 56 y.o.   MRN: [id number] HPI 56 yo man, tobacco use ~38 yrs (>120 pk-yrs), also HTN. No hx asthma. Was evaluated by Dr Eden Emms and underwent cardiac stress testing that was reassuring.  Describes longstanding exertional SOB that has worsened over the last 2 yrs. He has occas cough. He does hear wheezing. He has had a SABA that he uses a few times a day. He has also been on Advair, symbicort before. He has been exposed to asbestos through [employer] on cars. He has had many exacerbations of apparent COPD, treated with pred/abx. Never hospitalized, never intubated.   ROV 09/18/11 -- heavy tobacco hx, COPD/asthma based on PFTs. Last time initiated trial Advair. CXR from 06/2011 with hyperinflation.  He feels that the Advair may be helping but only a little bit, no real change.  Takes ProAir at least 3 x a day. Still smoking 1/2 pk a day.   ROV 02/08/12 -- tobacco, COPD/asthma. He is having more dyspnea than last time, always worst in the am, produces thick yellow phlegm every am. Now down to 5 cigarettes. Using ProAir 5-6x a day.   ROV 03/09/12 -- tobacco, COPD/asthma. Still with dyspnea, using SABA frequently. Having allergy sx since warm weather started. Last time we started spiriva - he feels that he has benefited. He stopped the Advair to see if he would miss it. He doesn't believe he misses it.      Objective:   Physical Exam  Gen: Pleasant, well-nourished, in no distress,  normal affect  ENT: No lesions,  mouth clear,  Some exp stridor  Neck: No JVD, no TMG, no carotid bruits  Lungs: No use of accessory muscles, some referred UA noise and expiratory wheezes.   Cardiovascular: RRR, heart sounds normal, no murmur or gallops, no peripheral edema  Musculoskeletal: No deformities, no cyanosis or clubbing  Neuro: alert, non focal  Skin: Warm, no lesions or rashes     Assessment & Plan:  COPD (chronic obstructive pulmonary  disease) Plan continue spiriva, stop advair. He will call if he declines and needs to restart it SABA prn  rx the allergies.

## 2012-03-14 ENCOUNTER — Ambulatory Visit: Payer: Self-pay | Admitting: Emergency Medicine

## 2012-03-25 ENCOUNTER — Telehealth: Payer: Self-pay | Admitting: Emergency Medicine

## 2012-03-25 NOTE — Telephone Encounter (Signed)
Called and spoke with pt and he is aware that we have no samples at this time.  Pt voiced his understanding of this.  He will check back next week to see if any are in.

## 2012-03-29 ENCOUNTER — Telehealth: Payer: Self-pay | Admitting: Emergency Medicine

## 2012-03-29 NOTE — Telephone Encounter (Signed)
I advised pt will leave 1 sample upfront for pick up of spiriva. He voiced his understanding. Also pt states he had applied for pt assistance for the spiriva and has not heard anything back yet. lori have you seen an apporval for this on pt, thanks

## 2012-03-29 NOTE — Telephone Encounter (Signed)
We will need to call [employer] at [phone] to check on this for the patient.

## 2012-03-30 NOTE — Telephone Encounter (Signed)
Forms and prescription have all been refaxed to [employer]. Pt is aware we are waiting on response and samples have been placed up front for him to pick up today.

## 2012-03-30 NOTE — Telephone Encounter (Signed)
I called [employer] and was advised they never received pt's assistance application. Per lori she will take care of this

## 2012-04-25 ENCOUNTER — Telehealth: Payer: Self-pay | Admitting: Emergency Medicine

## 2012-04-25 NOTE — Telephone Encounter (Signed)
lmomtcb x1 for pt 

## 2012-04-26 NOTE — Telephone Encounter (Signed)
lmomtcb x2 for pt 

## 2012-04-27 NOTE — Telephone Encounter (Signed)
Called, spoke with pt.  1 sample of spiriva left at front for pt -- he is aware.

## 2012-05-06 ENCOUNTER — Ambulatory Visit: Payer: Self-pay | Admitting: Emergency Medicine

## 2012-05-12 ENCOUNTER — Ambulatory Visit: Payer: Self-pay | Admitting: Emergency Medicine

## 2012-05-13 ENCOUNTER — Telehealth: Payer: Self-pay | Admitting: Emergency Medicine

## 2012-05-13 NOTE — Telephone Encounter (Signed)
LMTCB for [facility]

## 2012-05-17 NOTE — Telephone Encounter (Signed)
Spoke with [place] She states just wants to verify that we faxed the rx for spiriva form this office and if it is still ok to ship to the pt. I advised that yes this is correct and ok to ship med. She will send reminder to pt once med has been shipped.

## 2012-06-03 ENCOUNTER — Telehealth: Payer: Self-pay

## 2012-06-03 DIAGNOSIS — G4700 Insomnia, unspecified: Secondary | ICD-10-CM

## 2012-06-03 DIAGNOSIS — K219 Gastro-esophageal reflux disease without esophagitis: Secondary | ICD-10-CM

## 2012-06-03 NOTE — Telephone Encounter (Signed)
[facility] on [address] called to give refill request for Xanax 1mg . Pt has not had med filled here before.

## 2012-06-04 NOTE — Telephone Encounter (Signed)
Pull chart

## 2012-06-05 DIAGNOSIS — K219 Gastro-esophageal reflux disease without esophagitis: Secondary | ICD-10-CM | POA: Insufficient documentation

## 2012-06-05 DIAGNOSIS — G4700 Insomnia, unspecified: Secondary | ICD-10-CM | POA: Insufficient documentation

## 2012-06-05 MED ORDER — ALPRAZOLAM 1 MG PO TABS: 1.0000 mg | ORAL_TABLET | Freq: Every evening | ORAL | Status: AC | PRN

## 2012-06-05 NOTE — Telephone Encounter (Signed)
Authorized #30, no RF.  Will authorize #30 next month, too, but if wants a larger supply, he'll need to come in.  We can do a payment agreement, if that would help.

## 2012-06-05 NOTE — Telephone Encounter (Signed)
Paper chart requested for review.  Will ask prescriber (from January OV) is comfortable refilling at higher frequency without seeing him.

## 2012-06-05 NOTE — Telephone Encounter (Signed)
Pt states that he has been taking 2 tabs sometimes because he needed it.  Advised him that he must come in and he stated he would but he does not have any insurance at this time.  He will get insurance in August.  Would we be able to ok some for him?

## 2012-06-05 NOTE — Telephone Encounter (Signed)
lmom advising of note

## 2012-06-05 NOTE — Telephone Encounter (Signed)
Chart is at nurses station 

## 2012-06-05 NOTE — Telephone Encounter (Signed)
Patient was seen 01/04/12 and given 6 month supply of Xanax so he is not due for a refill until July. If he is using more frequently needs OV for re-evaluation

## 2012-07-05 ENCOUNTER — Encounter: Payer: Self-pay | Admitting: Emergency Medicine

## 2012-07-05 ENCOUNTER — Telehealth: Payer: Self-pay | Admitting: *Deleted

## 2012-07-05 ENCOUNTER — Ambulatory Visit (INDEPENDENT_AMBULATORY_CARE_PROVIDER_SITE_OTHER): Payer: Self-pay | Admitting: Emergency Medicine

## 2012-07-05 VITALS — BP 142/90 | HR 92 | Temp 98.2°F | Ht 71.0 in | Wt 208.8 lb

## 2012-07-05 DIAGNOSIS — J449 Chronic obstructive pulmonary disease, unspecified: Secondary | ICD-10-CM

## 2012-07-05 DIAGNOSIS — J4489 Other specified chronic obstructive pulmonary disease: Secondary | ICD-10-CM

## 2012-07-05 MED ORDER — ALBUTEROL SULFATE HFA 108 (90 BASE) MCG/ACT IN AERS: 2.0000 | INHALATION_SPRAY | Freq: Four times a day (QID) | RESPIRATORY_TRACT | Status: DC | PRN

## 2012-07-05 NOTE — Telephone Encounter (Signed)
Pt was seen today in office by RB...Marland KitchenTold nurse and RB to phone in Rx to certain pharmacy, when pharmacy called and checked patients meds not filled there([facility] LMOM on machine for patient to return call and verify where exactly he wants his meds called in.   Per RB-- Ventolin 2 puffs q6hrs prn sob 11rf

## 2012-07-05 NOTE — Progress Notes (Signed)
  Subjective:    Patient ID: Robert Church, male    DOB: [birth year], 56 y.o.   MRN: [id number] HPI 56 yo man, tobacco use ~38 yrs (>120 pk-yrs), also HTN. No hx asthma. Was evaluated by Dr Eden Emms and underwent cardiac stress testing that was reassuring.  Describes longstanding exertional SOB that has worsened over the last 2 yrs. He has occas cough. He does hear wheezing. He has had a SABA that he uses a few times a day. He has also been on Advair, symbicort before. He has been exposed to asbestos through [employer] on cars. He has had many exacerbations of apparent COPD, treated with pred/abx. Never hospitalized, never intubated.   ROV 09/18/11 -- heavy tobacco hx, COPD/asthma based on PFTs. Last time initiated trial Advair. CXR from 06/2011 with hyperinflation.  He feels that the Advair may be helping but only a little bit, no real change.  Takes ProAir at least 3 x a day. Still smoking 1/2 pk a day.   ROV 02/08/12 -- tobacco, COPD/asthma. He is having more dyspnea than last time, always worst in the am, produces thick yellow phlegm every am. Now down to 5 cigarettes. Using ProAir 5-6x a day.   ROV 03/09/12 -- tobacco, COPD/asthma. Still with dyspnea, using SABA frequently. Having allergy sx since warm weather started. Last time we started spiriva - he feels that he has benefited. He stopped the Advair to see if he would miss it. He doesn't believe he misses it.   ROV 07/05/12 -- tobacco, COPD/asthma. Also with allergic rhinitis and PND. He added back Advair to Spiriva since last time to see what it would do. He has since stopped Spiriva and changed from Pro-Air to Ventolin. He would like to stay on this regimen. He is smoking 1/2 pack a day. No exacerbations.      Objective:   Physical Exam Filed Vitals:   07/05/12 1504  BP: 142/90  Pulse: 92  Temp: 98.2 F (36.8 C)   Gen: Pleasant, well-nourished, in no distress,  normal affect  ENT: No lesions,  mouth clear,  Some exp stridor  Neck: No JVD,  no TMG, no carotid bruits  Lungs: No use of accessory muscles, some referred UA noise and expiratory wheezes.   Cardiovascular: RRR, heart sounds normal, no murmur or gallops, no peripheral edema  Musculoskeletal: No deformities, no cyanosis or clubbing  Neuro: alert, non focal  Skin: Warm, no lesions or rashes     Assessment & Plan:  COPD (chronic obstructive pulmonary disease) Will stay on Advair bid + Ventolin for now, consider adding back Spiriva rov 3 months

## 2012-07-05 NOTE — Telephone Encounter (Signed)
Addendum to previous message.Marland KitchenMarland Kitchenprescription for Ventolin 2 puffs q6hrs prn sob -11rf; phoned [facility] [address] [place] per pts preferences. Pharmacist stated he would put medication on hold in case pt decided to pick up from that pharmacy since we were unable to reach him. Voicemail left with pt at [phone](M)(H).  Kabao Leite Mabe, C.MA

## 2012-07-05 NOTE — Patient Instructions (Addendum)
Continue your Advair and Ventolin as you are taking them Continue to work on stopping smoking Follow with Dr Delton Coombes in 3 months or sooner if you have any problems.

## 2012-07-05 NOTE — Assessment & Plan Note (Signed)
Will stay on Advair bid + Ventolin for now, consider adding back Spiriva rov 3 months

## 2012-07-05 NOTE — Addendum Note (Signed)
Addended by: Nita Sells on: 07/05/2012 05:17 PM   Modules accepted: Orders

## 2012-07-06 ENCOUNTER — Telehealth: Payer: Self-pay | Admitting: Emergency Medicine

## 2012-07-06 NOTE — Telephone Encounter (Signed)
I spoke with pt and he states the only thing he needed was too know if RB schedule was out for October yet. i advised him it was not. Pt states he did not need anything further

## 2012-07-22 ENCOUNTER — Telehealth: Payer: Self-pay

## 2012-07-22 NOTE — Telephone Encounter (Signed)
The patient called to discuss issues with his medication.  Please call patient at [phone].

## 2012-07-22 NOTE — Telephone Encounter (Signed)
Uses [facility] [address] wants renewal on Xanax 1mg  tablet he takes at bedtime [phone] requests 6 month supply states Dr Georgiana Shore gave him 6 month supply due to no insurance chart pulled.

## 2012-07-23 ENCOUNTER — Telehealth: Payer: Self-pay

## 2012-07-23 NOTE — Telephone Encounter (Signed)
Pt is checking on status of a refill request of zanax 1mg  patient was told yesterday that we have [facility] [address]

## 2012-07-23 NOTE — Telephone Encounter (Signed)
I am sorry we need to see this patient to rx this medication.

## 2012-07-23 NOTE — Telephone Encounter (Signed)
Called pt and advised to RTC. Pt understood

## 2012-07-23 NOTE — Telephone Encounter (Signed)
Per Robert Church he needs office visit

## 2012-07-25 NOTE — Telephone Encounter (Signed)
I called pt to advise and he will come in to discuss

## 2012-08-10 ENCOUNTER — Telehealth: Payer: Self-pay | Admitting: Emergency Medicine

## 2012-08-10 MED ORDER — ALBUTEROL SULFATE HFA 108 (90 BASE) MCG/ACT IN AERS: 2.0000 | INHALATION_SPRAY | Freq: Four times a day (QID) | RESPIRATORY_TRACT | Status: DC | PRN

## 2012-08-10 NOTE — Telephone Encounter (Signed)
Patient last seen by RB 7.16.13, follow up in 4 months.  Called spoke with patient who is requesting a printed rx for his ventolin hfa with additional refills.  Will be getting this from [place].  Rx printed for RB to sign; pt will pick up when ready.  Pt is aware RB is back in the office on 8.22.13 in PM and that he will be contacted once this is ready to be picked up.  Rx printed, given to Lauren.  Message routed to RB and Lauren.

## 2012-08-11 NOTE — Telephone Encounter (Signed)
Done

## 2012-08-12 NOTE — Telephone Encounter (Signed)
Rx has been signed by RB and placed upfront for pickup.  Attempted to call patient, no answer. Left message on machine that patient could pick up Rx at front and/or call office back if anything further is needed.

## 2012-08-26 ENCOUNTER — Telehealth: Payer: Self-pay | Admitting: Emergency Medicine

## 2012-08-26 NOTE — Telephone Encounter (Signed)
Received form, will fill out and have Dr. Delton Coombes sign Monday, as he is not in office at this time.

## 2012-08-26 NOTE — Telephone Encounter (Signed)
chantel will give forms to lauren and will route to her box

## 2012-09-01 ENCOUNTER — Other Ambulatory Visit: Payer: Self-pay | Admitting: *Deleted

## 2012-09-01 MED ORDER — FLUTICASONE-SALMETEROL 250-50 MCG/DOSE IN AEPB: 1.0000 | INHALATION_SPRAY | Freq: Two times a day (BID) | RESPIRATORY_TRACT | Status: DC

## 2012-09-01 MED ORDER — TIOTROPIUM BROMIDE MONOHYDRATE 18 MCG IN CAPS: 18.0000 ug | ORAL_CAPSULE | Freq: Every day | RESPIRATORY_TRACT | Status: DC

## 2012-09-01 NOTE — Telephone Encounter (Signed)
Wrong Rx printed out. Error

## 2012-09-07 NOTE — Telephone Encounter (Signed)
Attempted to call patient and give him an update on [facility] paperwork. WCB  Note: Paperwork Has been signed and faxed to day.

## 2012-09-07 NOTE — Telephone Encounter (Signed)
Patient returned call, gave him update on forms.  Verbalized understanding and nothing further needed at this time.

## 2012-09-12 NOTE — Telephone Encounter (Signed)
Forms were faxed on 09/07/2012.

## 2012-09-14 ENCOUNTER — Telehealth: Payer: Self-pay | Admitting: *Deleted

## 2012-09-14 ENCOUNTER — Telehealth: Payer: Self-pay | Admitting: Emergency Medicine

## 2012-09-14 NOTE — Telephone Encounter (Signed)
Called to check on status of patients [facility] form-- spoke w representative who stated they have or did not receive form.  Form was faxed 09/07/12.  Representative stated to make sure patients ID [id number] is on Rx, and that there is a Scientist, product/process development w MD info on it (which all is). Forms REFAXED today at 5pm will follow up.

## 2012-09-14 NOTE — Telephone Encounter (Signed)
Spoke with patient-aware we have no samples in office but can call an Rx in for him while waiting for his mail order to come in; states he has Rx on file at local pharmacy but did not want to spend the money. Pt will go ahead and get Rx filled.

## 2012-09-20 ENCOUNTER — Telehealth: Payer: Self-pay | Admitting: Emergency Medicine

## 2012-09-20 MED ORDER — FLUTICASONE-SALMETEROL 250-50 MCG/DOSE IN AEPB: 1.0000 | INHALATION_SPRAY | Freq: Two times a day (BID) | RESPIRATORY_TRACT | Status: DC

## 2012-09-20 NOTE — Telephone Encounter (Signed)
Called [facility] to check on status of patients Rx.  Informed that shipment was sent out Sept. 27,2013 and expected to arrive in 4-5 business days.  Original Rx was sent for quantity of 60 w 3 Refills, representative informed me to send out Rx for quantity of 180 with 3 refills instead to make easier for patient.  Will do this and fax to program.  Called and spoke with patient to inform him of current status of medication.  Patient verbalized understanding and nothing further needed at this time.

## 2012-10-03 ENCOUNTER — Ambulatory Visit (INDEPENDENT_AMBULATORY_CARE_PROVIDER_SITE_OTHER): Payer: Self-pay | Admitting: Emergency Medicine

## 2012-10-03 ENCOUNTER — Encounter: Payer: Self-pay | Admitting: Emergency Medicine

## 2012-10-03 VITALS — BP 130/88 | HR 85 | Ht 70.5 in | Wt 216.8 lb

## 2012-10-03 DIAGNOSIS — R05 Cough: Secondary | ICD-10-CM

## 2012-10-03 DIAGNOSIS — K219 Gastro-esophageal reflux disease without esophagitis: Secondary | ICD-10-CM

## 2012-10-03 DIAGNOSIS — J449 Chronic obstructive pulmonary disease, unspecified: Secondary | ICD-10-CM

## 2012-10-03 DIAGNOSIS — F17200 Nicotine dependence, unspecified, uncomplicated: Secondary | ICD-10-CM

## 2012-10-03 DIAGNOSIS — R059 Cough, unspecified: Secondary | ICD-10-CM

## 2012-10-03 DIAGNOSIS — J309 Allergic rhinitis, unspecified: Secondary | ICD-10-CM

## 2012-10-03 DIAGNOSIS — R053 Chronic cough: Secondary | ICD-10-CM

## 2012-10-03 DIAGNOSIS — J302 Other seasonal allergic rhinitis: Secondary | ICD-10-CM

## 2012-10-03 DIAGNOSIS — IMO0001 Reserved for inherently not codable concepts without codable children: Secondary | ICD-10-CM

## 2012-10-03 NOTE — Patient Instructions (Addendum)
Continue Advair Increase omeprazole to 20mg  BID for 10 days then decrease back to 20mg  daily Zyrtec daily Nasal saline washes daily Start nasal spray--2 sprays each nostril daily Follow Up in 2 months

## 2012-10-03 NOTE — Progress Notes (Signed)
  Subjective:    Patient ID: Robert Church, male    DOB: 1956/05/23, 56 y.o.   MRN: [id number] HPI 56 yo man, tobacco use ~38 yrs (>120 pk-yrs), also HTN. No hx asthma. Was evaluated by Dr Eden Emms and underwent cardiac stress testing that was reassuring.  Describes longstanding exertional SOB that has worsened over the last 2 yrs. He has occas cough. He does hear wheezing. He has had a SABA that he uses a few times a day. He has also been on Advair, symbicort before. He has been exposed to asbestos through [employer] on cars. He has had many exacerbations of apparent COPD, treated with pred/abx. Never hospitalized, never intubated.   ROV 09/18/11 -- heavy tobacco hx, COPD/asthma based on PFTs. Last time initiated trial Advair. CXR from 06/2011 with hyperinflation.  He feels that the Advair may be helping but only a little bit, no real change.  Takes ProAir at least 3 x a day. Still smoking 1/2 pk a day.   ROV 02/08/12 -- tobacco, COPD/asthma. He is having more dyspnea than last time, always worst in the am, produces thick yellow phlegm every am. Now down to 5 cigarettes. Using ProAir 5-6x a day.   ROV 03/09/12 -- tobacco, COPD/asthma. Still with dyspnea, using SABA frequently. Having allergy sx since warm weather started. Last time we started spiriva - he feels that he has benefited. He stopped the Advair to see if he would miss it. He doesn't believe he misses it.   ROV 07/05/12 -- tobacco, COPD/asthma. Also with allergic rhinitis and PND. He added back Advair to Spiriva since last time to see what it would do. He has since stopped Spiriva and changed from Pro-Air to Ventolin. He would like to stay on this regimen. He is smoking 1/2 pack a day. No exacerbations.   ROV 10/03/12 -- COPD/asthma. Allergies. Has been back on Advair for the last week, off spiriva. Has been having wakeups from sleep with dyspnea. On prilosec qd. Continues to have cough exacerbated by worsening allergies and postnasal drip. Continues  to smoke.      Objective:   Physical Exam Filed Vitals:   10/03/12 1158  BP: 130/88  Pulse: 85   Gen: Pleasant, well-nourished, in no distress,  normal affect  ENT: No lesions,  mouth clear,  Some exp stridor  Neck: No JVD, no TMG, no carotid bruits  Lungs: No use of accessory muscles, some referred UA noise and expiratory wheezes.   Cardiovascular: RRR, heart sounds normal, no murmur or gallops, no peripheral edema  Musculoskeletal: No deformities, no cyanosis or clubbing  Neuro: alert, non focal  Skin: Warm, no lesions or rashes     Assessment & Plan:  COPD (chronic obstructive pulmonary disease) - continue the advair - SABA prn  - consider PSG given nocturnal sx - increase omeprazole to bid - start zyrtec qd - start nasal steroid - start nasal saline washes  Smoking Discussed and encouraged cessation  Seasonal allergic rhinitis Zyrtec daily Nasal saline washes daily Start fluticasone nasal spray--2 sprays each nostril daily Follow Up in 2 months  Chronic cough Increase omeprazole to 20mg  BID for 10 days then decrease back to 20mg  daily Zyrtec daily Nasal saline washes daily Start fluticasone nasal spray--2 sprays each nostril daily   GERD (gastroesophageal reflux disease) Increase omeprazole to 20mg  BID for 10 days then decrease back to 20mg  daily

## 2012-10-03 NOTE — Assessment & Plan Note (Addendum)
-   continue the advair - SABA prn  - consider PSG given nocturnal sx - increase omeprazole to bid - start zyrtec qd - start nasal steroid - start nasal saline washes

## 2012-10-06 DIAGNOSIS — J302 Other seasonal allergic rhinitis: Secondary | ICD-10-CM | POA: Insufficient documentation

## 2012-10-06 DIAGNOSIS — R053 Chronic cough: Secondary | ICD-10-CM | POA: Insufficient documentation

## 2012-10-06 DIAGNOSIS — R05 Cough: Secondary | ICD-10-CM | POA: Insufficient documentation

## 2012-10-06 NOTE — Assessment & Plan Note (Signed)
Discussed and encouraged cessation.  

## 2012-10-06 NOTE — Assessment & Plan Note (Signed)
Increase omeprazole to 20mg  BID for 10 days then decrease back to 20mg  daily Zyrtec daily Nasal saline washes daily Start fluticasone nasal spray--2 sprays each nostril daily

## 2012-10-06 NOTE — Assessment & Plan Note (Signed)
Increase omeprazole to 20mg  BID for 10 days then decrease back to 20mg  daily

## 2012-10-06 NOTE — Assessment & Plan Note (Signed)
Zyrtec daily Nasal saline washes daily Start fluticasone nasal spray--2 sprays each nostril daily Follow Up in 2 months

## 2012-10-20 ENCOUNTER — Telehealth: Payer: Self-pay | Admitting: Emergency Medicine

## 2012-10-25 MED ORDER — FLUTICASONE-SALMETEROL 250-50 MCG/DOSE IN AEPB: 1.0000 | INHALATION_SPRAY | Freq: Two times a day (BID) | RESPIRATORY_TRACT | Status: DC

## 2012-10-25 MED ORDER — ALBUTEROL SULFATE HFA 108 (90 BASE) MCG/ACT IN AERS: 2.0000 | INHALATION_SPRAY | Freq: Four times a day (QID) | RESPIRATORY_TRACT | Status: DC | PRN

## 2012-10-25 NOTE — Telephone Encounter (Signed)
Patient has dropped off paperwork, however did not fill out the areas in which he as the patient needs to fill out.  Spoke with Robert Church and made him aware of this and he stated he will be by on 11/6 to complete his part of the paperwork. Rxs are ready to fax off just waiting on patient.

## 2012-10-25 NOTE — Telephone Encounter (Signed)
Lauren, has this been done?  Thanks.

## 2012-10-26 ENCOUNTER — Telehealth: Payer: Self-pay | Admitting: Emergency Medicine

## 2012-10-26 MED ORDER — FLUTICASONE PROPIONATE 50 MCG/ACT NA SUSP: 2.0000 | Freq: Every day | NASAL | Status: DC

## 2012-10-26 NOTE — Telephone Encounter (Signed)
Last seen by RB 10.14.13, follow up in 2 months.   Called spoke with patient who is requesting samples of ventolin and nasonex.  Pt stated that he is waiting for his shipment of ventolin from pt assistance, but does not have a SABA to hold him until then.  Unfortunately we have no samples of the ventolin and nasonex is not on pt's med list.  Fluticasone was mentioned at last ov : Seasonal allergic rhinitis  Zyrtec daily  Nasal saline washes daily  Start fluticasone nasal spray--2 sprays each nostril daily  Follow Up in 2 months  Dr Delton Coombes, may pt have samples(s) of nasonex?  Thanks.

## 2012-10-26 NOTE — Telephone Encounter (Signed)
Pt came to office today and signed paperwork.  Pt is requesting to know if this has been faxed to [facility] yet.  Please advise, thanks.

## 2012-10-26 NOTE — Telephone Encounter (Signed)
I have a ProAir that he can pick up. Also ok to substitute nasonex 2 sprays each nostril daily until he can get back on the fluticasone

## 2012-10-26 NOTE — Telephone Encounter (Signed)
Called spoke with patient and advised of RB's recs of the proair and nasonex samples.  Pt will pick these up tomorrow.  Pt stated he did not receive a rx for fluticasone > will send to verified pharmacy since it is it specifically states pt may start in last ov note.  Will sign off.

## 2012-10-28 NOTE — Telephone Encounter (Signed)
Pt picked up his samples today. Wants to know what the status is on paperwork. Call him at same # as before. Hazel Sams

## 2012-10-31 NOTE — Telephone Encounter (Signed)
Pt returned call. I advised him that this has been faxed. Nothing further needed at this time per pt. "Thanks". Hazel Sams

## 2012-10-31 NOTE — Telephone Encounter (Signed)
Paperwork has been faxed off.  ATC patient to inform of this, no answer. LMOMTCB

## 2012-11-02 ENCOUNTER — Telehealth: Payer: Self-pay | Admitting: Emergency Medicine

## 2012-11-02 NOTE — Telephone Encounter (Signed)
I still have paperwork.  I will refax.

## 2012-11-02 NOTE — Telephone Encounter (Signed)
I spoke with pt and he stated [facility] never received his paperwork for his advair/ventolin.  states it was faxed on 10/31/12. Lauren do you have the paperwork to refax, thanks

## 2012-11-03 MED ORDER — ALBUTEROL SULFATE HFA 108 (90 BASE) MCG/ACT IN AERS: 2.0000 | INHALATION_SPRAY | Freq: Four times a day (QID) | RESPIRATORY_TRACT | Status: DC | PRN

## 2012-11-03 NOTE — Telephone Encounter (Signed)
Lauren do you still have the paperwork? Please advise thanks

## 2012-11-03 NOTE — Telephone Encounter (Signed)
lmomtcb x1 for pt 

## 2012-11-03 NOTE — Telephone Encounter (Signed)
Spoke with patient, I made him aware that these forms have been faxed to [facility] to [facility] 3 times already.  I informed patient that I will refax papers, and I will give [facility] and patient a phone call in the AM. Patient also states that he does not use Pro Air he just used a sample but he normally uses ventolin.  I have printed out Rx for ventolin and will fax that with the other forms as well.  Apologized to patient for the confusion and hold up, he verbalized understanding of this nothing further needed at this time.

## 2012-11-03 NOTE — Telephone Encounter (Signed)
Pt states that [facility] only received a portion of the paperwork.  Pt needs ALL of the paperwork to be faxed to [facility] ASAP.  Also, paperwork was for ProAir.  Pt does not use proair.  Pt uses ventolin & needs the paperwork to reflect his.  Pt is very upset that he has not been contacted yet & asks to be reached at [phone] ASAP.  Antionette Fairy

## 2012-11-04 NOTE — Telephone Encounter (Signed)
Called and spoke with patient. Made him aware that [employer] has denied me talking about the status of this patients aapplication until he grants me me permission to them.  He stated he would call them and let me know when this has been done so I can further help in completing the application process.

## 2012-11-04 NOTE — Telephone Encounter (Signed)
Pt returned Lauren's call.  Holly D Pryor ° °

## 2012-11-04 NOTE — Telephone Encounter (Signed)
I have called and spoke with [facility] at [place] [facility] to ensure papers were received and to see if anything else was needed.  Maxine Glenn has informed me that I do not have access to talk to them about patient.  I asked Maxine Glenn, that although I just went through getting patients application approved two months ago this means I cant do it now??.She stated that this was correct and also stated things change and I would need to call patient and have him call [employer] to allow me to talk to them on his behalf.  -------  ATC patient to inform him of this, no answer. LMOMTCB

## 2012-11-07 ENCOUNTER — Ambulatory Visit (INDEPENDENT_AMBULATORY_CARE_PROVIDER_SITE_OTHER): Payer: BC Managed Care – PPO | Admitting: Physician Assistant

## 2012-11-07 VITALS — BP 144/84 | HR 95 | Temp 98.4°F | Resp 17 | Ht 68.5 in | Wt 210.0 lb

## 2012-11-07 DIAGNOSIS — Z760 Encounter for issue of repeat prescription: Secondary | ICD-10-CM

## 2012-11-07 DIAGNOSIS — G4700 Insomnia, unspecified: Secondary | ICD-10-CM

## 2012-11-07 MED ORDER — ALPRAZOLAM ER 1 MG PO TB24: 1.0000 mg | ORAL_TABLET | Freq: Every evening | ORAL | Status: DC | PRN

## 2012-11-07 NOTE — Progress Notes (Signed)
  Subjective:    Patient ID: Robert Church, male    DOB: 09/25/1956, 56 y.o.   MRN: [id number]  HPI  Robert Church is a 56 yr old male here for refill of Xanax.  Pt was last seen here in January 2013 by Dr. Georgiana Shore.  It is unclear from the chart who originally prescribed this for him as it does not look like it was [facility].  He has no PCP listed.  He states he thought that Dr. Georgiana Shore is the one who prescribed this for him.  He states he has been taking this for about a year.  He takes 1 per night for sleep every night.  States that nothing else works for him including ambien and OTC sleep medications.  He sleeps about 8 hours a night with xanax, only about 3 without.     Review of Systems  Constitutional: Negative.   Respiratory: Negative.   Cardiovascular: Negative.   Gastrointestinal: Negative.   Neurological: Negative.        Objective:   Physical Exam  Vitals reviewed. Constitutional: He is oriented to person, place, and time. He appears well-developed and well-nourished. No distress.  HENT:  Head: Normocephalic and atraumatic.  Neck: Neck supple. No thyromegaly present.  Cardiovascular: Normal rate, regular rhythm, normal heart sounds and intact distal pulses.   Pulmonary/Chest: Effort normal and breath sounds normal. He has no wheezes. He has no rales.  Lymphadenopathy:    He has no cervical adenopathy.  Neurological: He is alert and oriented to person, place, and time.  Skin: Skin is warm and dry.  Psychiatric: He has a normal mood and affect. His behavior is normal.     Filed Vitals:   11/07/12 0846  BP: 144/84  Pulse: 95  Temp: 98.4 F (36.9 C)  Resp: 17        Assessment & Plan:   1. Insomnia, unspecified   2. Medication refill     Robert Church is a 56 yr old male here for refill of Xanax.  It's unclear where he was originally prescribed this medication, but we have been filling it since January 2013.  Pt seemed somewhat irritated with my questions about this prescription,  but he was cooperative.  I have given him a one month supply today.  I have told him that he can call for refills for up to 6 months, but that sometime between now and May 2014 he needs to be seen for a physical/labs if we are going to continue prescribing this medication for him.  He voiced understanding and is in agreement with this plan.

## 2012-11-07 NOTE — Patient Instructions (Addendum)
You may call for refills for 6 months, but between now and may 2014, we need to see you for a physical/labs for more refills.

## 2012-11-15 NOTE — Telephone Encounter (Signed)
Called and spoke with patient. He stated that he has spoken with [employer] and they requested his tax form also be sent in. Patient states that he has sent this paperwork in and that he is going to call [employer] today to see if he was approved.  Patient also stated that he has given [employer] my name if anything further is needed I can speak on his behalf.  Nothing further needed at this time. Will sign off.

## 2012-12-09 ENCOUNTER — Other Ambulatory Visit: Payer: Self-pay | Admitting: Physician Assistant

## 2012-12-09 NOTE — Telephone Encounter (Signed)
Prescription printed and signed.  Please remind patient that he needs to schedule a CPE before May 2014 if we are to keep prescribing this for him.

## 2012-12-12 ENCOUNTER — Ambulatory Visit (INDEPENDENT_AMBULATORY_CARE_PROVIDER_SITE_OTHER): Payer: BC Managed Care – PPO | Admitting: Emergency Medicine

## 2012-12-12 ENCOUNTER — Encounter: Payer: Self-pay | Admitting: Emergency Medicine

## 2012-12-12 ENCOUNTER — Ambulatory Visit (INDEPENDENT_AMBULATORY_CARE_PROVIDER_SITE_OTHER)
Admission: RE | Admit: 2012-12-12 | Discharge: 2012-12-12 | Disposition: A | Discharge status: Home / Self Care | Payer: Self-pay | Source: Ambulatory Visit | Attending: Emergency Medicine | Admitting: Emergency Medicine

## 2012-12-12 VITALS — BP 138/88 | HR 87 | Temp 99.0°F | Ht 70.5 in | Wt 218.2 lb

## 2012-12-12 DIAGNOSIS — F17200 Nicotine dependence, unspecified, uncomplicated: Secondary | ICD-10-CM

## 2012-12-12 DIAGNOSIS — J4489 Other specified chronic obstructive pulmonary disease: Secondary | ICD-10-CM

## 2012-12-12 DIAGNOSIS — J449 Chronic obstructive pulmonary disease, unspecified: Secondary | ICD-10-CM

## 2012-12-12 NOTE — Patient Instructions (Addendum)
Start using Spiriva once daily every day Start using Advair twice a day every day Use ventolin as needed We discussed a plan for you to stop smoking using the e-cigarette. Call our office when you are ready to set your quit date Follow with Dr Delton Coombes in 3 months or sooner if you have any problems.

## 2012-12-12 NOTE — Progress Notes (Signed)
Subjective:    Patient ID: Robert Church, male    DOB: Dec 17, 1956, 56 y.o.   MRN: [id number] HPI 56 yo man, tobacco use ~38 yrs (>120 pk-yrs), also HTN. No hx asthma. Was evaluated by Dr Eden Emms and underwent cardiac stress testing that was reassuring.  Describes longstanding exertional SOB that has worsened over the last 2 yrs. He has occas cough. He does hear wheezing. He has had a SABA that he uses a few times a day. He has also been on Advair, symbicort before. He has been exposed to asbestos through [employer] on cars. He has had many exacerbations of apparent COPD, treated with pred/abx. Never hospitalized, never intubated.   ROV 54/28/12 -- heavy tobacco hx, COPD/asthma based on PFTs. Last time initiated trial Advair. CXR from 06/2011 with hyperinflation.  He feels that the Advair may be helping but only a little bit, no real change.  Takes ProAir at least 3 x a day. Still smoking 1/2 pk a day.   ROV 54/18/13 -- tobacco, COPD/asthma. He is having more dyspnea than last time, always worst in the am, produces thick yellow phlegm every am. Now down to 5 cigarettes. Using ProAir 5-6x a day.   ROV 54/20/13 -- tobacco, COPD/asthma. Still with dyspnea, using SABA frequently. Having allergy sx since warm weather started. Last time we started spiriva - he feels that he has benefited. He stopped the Advair to see if he would miss it. He doesn't believe he misses it.   ROV 54/16/13 -- tobacco, COPD/asthma. Also with allergic rhinitis and PND. He added back Advair to Spiriva since last time to see what it would do. He has since stopped Spiriva and changed from Pro-Air to Ventolin. He would like to stay on this regimen. He is smoking 1/2 pack a day. No exacerbations.   ROV 54/14/13 -- COPD/asthma. Allergies. Has been back on Advair for the last week, off spiriva. Has been having wakeups from sleep with dyspnea. On prilosec qd. Continues to have cough exacerbated by worsening allergies and postnasal drip. Continues  to smoke.   ROV 54/23/13 -- COPD/asthma. Allergies. Last time we continued Advair, started an allergy regimen - added nasal steroid, [place], zyrtec, temporary increased PPI.  Returns today with no real change in his coughing spells. Cigarettes are down to 1/2 pack a day. He is having SOB when supine, gets awakened from sleep SOB, snores. He switched Advair to Spiriva about a weeks ago, hasn't really noticed a change.        Objective:   Physical Exam Filed Vitals:   12/12/12 1433  BP: 138/88  Pulse: 87  Temp: 99 F (37.2 C)   Gen: Pleasant, well-nourished, in no distress,  normal affect  ENT: No lesions,  mouth clear,  Some exp stridor  Neck: No JVD, no TMG, no carotid bruits  Lungs: No use of accessory muscles, some referred UA noise and expiratory wheezes.   Cardiovascular: RRR, heart sounds normal, no murmur or gallops, no peripheral edema  Musculoskeletal: No deformities, no cyanosis or clubbing  Neuro: alert, non focal  Skin: Warm, no lesions or rashes     Assessment & Plan:  Smoking Discussed strategy for cessation - he is going to start e-cig and set a quit date  COPD (chronic obstructive pulmonary disease) Continue Advair and Spiriva.  (He was alternating these every few days) Ventolin prn Discussed a plan for smoking cessation, he will call me when he sets a quit date Screening CXR today Follow with  Dr Delton Coombes in 2 months or sooner if you have any problems.

## 2012-12-12 NOTE — Assessment & Plan Note (Signed)
Continue Advair and Spiriva.  (He was alternating these every few days) Ventolin prn Discussed a plan for smoking cessation, he will call me when he sets a quit date Screening CXR today Follow with Dr Delton Coombes in 2 months or sooner if you have any problems.

## 2012-12-12 NOTE — Assessment & Plan Note (Signed)
Discussed strategy for cessation - he is going to start e-cig and set a quit date

## 2012-12-13 ENCOUNTER — Telehealth: Payer: Self-pay

## 2012-12-13 ENCOUNTER — Other Ambulatory Visit: Payer: Self-pay | Admitting: Physician Assistant

## 2012-12-13 ENCOUNTER — Ambulatory Visit: Payer: BC Managed Care – PPO

## 2012-12-13 ENCOUNTER — Ambulatory Visit (INDEPENDENT_AMBULATORY_CARE_PROVIDER_SITE_OTHER): Payer: BC Managed Care – PPO | Admitting: Emergency Medicine

## 2012-12-13 VITALS — BP 114/74 | HR 105 | Temp 99.3°F | Resp 18 | Ht 69.5 in | Wt 214.0 lb

## 2012-12-13 DIAGNOSIS — R059 Cough, unspecified: Secondary | ICD-10-CM

## 2012-12-13 DIAGNOSIS — R509 Fever, unspecified: Secondary | ICD-10-CM

## 2012-12-13 DIAGNOSIS — R05 Cough: Secondary | ICD-10-CM

## 2012-12-13 DIAGNOSIS — J449 Chronic obstructive pulmonary disease, unspecified: Secondary | ICD-10-CM

## 2012-12-13 LAB — POCT CBC
Granulocyte percent: 88.2 %G — AB (ref 37–80)
HCT, POC: 49.8 % (ref 43.5–53.7)
Hemoglobin: 16.1 g/dL (ref 14.1–18.1)
Lymph, poc: 0.7 (ref 0.6–3.4)
MCH, POC: 32.8 pg — AB (ref 27–31.2)
MCHC: 32.3 g/dL (ref 31.8–35.4)
MCV: 101.5 fL — AB (ref 80–97)
MID (cbc): 0.5 (ref 0–0.9)
MPV: 8.4 fL (ref 0–99.8)
POC Granulocyte: 8.6 — AB (ref 2–6.9)
POC LYMPH PERCENT: 7.1 %L — AB (ref 10–50)
POC MID %: 4.7 %M (ref 0–12)
Platelet Count, POC: 229 10*3/uL (ref 142–424)
RBC: 4.91 M/uL (ref 4.69–6.13)
RDW, POC: 13.2 %
WBC: 9.7 10*3/uL (ref 4.6–10.2)

## 2012-12-13 LAB — POCT INFLUENZA A/B
Influenza A, POC: NEGATIVE
Influenza B, POC: NEGATIVE

## 2012-12-13 MED ORDER — MOXIFLOXACIN HCL 400 MG PO TABS: 400.0000 mg | ORAL_TABLET | Freq: Every day | ORAL | Status: DC

## 2012-12-13 NOTE — Progress Notes (Signed)
  Subjective:    Patient ID: Robert Church, male    DOB: 01-19-1956, 56 y.o.   MRN: [id number]  HPI patient is a history of COPD and was seen by lung specialist yesterday because of his chronic cough. He enters today with fever or chills worsening cough. He did not take a flu shot this year. He is not coughing up much phlegm. He does have a headache and myalgias associated with this. He has significant COPD and is treated with advil,spiriva, and albuterol       Review of Systems     Objective:   Physical Exam HEENT exam. TMs are clear nose is congested. Throat is normal. Chest is clear to both auscultation and percussion except in the right posterior chest where there are  definite rales.. Cardiac exam is regular rate without murmurs rubs or gallops.  Results for orders placed in visit on 09/01/11  PULMONARY FUNCTION TEST      Component Value Range   FEV1       FVC       FEV1/FVC       TLC       DLCO      [facility] reading (PRIMARY) by  Dr.Spruha Weight patient's COPD possible increased markings right middle lobe. Results for orders placed in visit on 12/13/12  POCT CBC      Component Value Range   WBC 9.7  4.6 - 10.2 K/uL   Lymph, poc 0.7  0.6 - 3.4   POC LYMPH PERCENT 7.1 (*) 10 - 50 %L   MID (cbc) 0.5  0 - 0.9   POC MID % 4.7  0 - 12 %M   POC Granulocyte 8.6 (*) 2 - 6.9   Granulocyte percent 88.2 (*) 37 - 80 %G   RBC 4.91  4.69 - 6.13 M/uL   Hemoglobin 16.1  14.1 - 18.1 g/dL   HCT, POC 40.9  81.1 - 53.7 %   MCV 101.5 (*) 80 - 97 fL   MCH, POC 32.8 (*) 27 - 31.2 pg   MCHC 32.3  31.8 - 35.4 g/dL   RDW, POC 91.4     Platelet Count, POC 229  142 - 424 K/uL   MPV 8.4  0 - 99.8 fL  POCT INFLUENZA A/B      Component Value Range   Influenza A, POC Negative     Influenza B, POC Negative           Assessment & Plan:  He had a chest x-ray yesterday but will repeat it today. He also flu tests and CBC .

## 2012-12-13 NOTE — Telephone Encounter (Signed)
The patient called from the pharmacy to report that the Avalox prescribed at his visit with Dr. Cleta Alberts today 12/13/12 was over $50.00 and he would like a more cost effective medication to treat his infection.  The patient may be reached at [phone].

## 2012-12-13 NOTE — Telephone Encounter (Signed)
Please call the pharmacy.  The patient requested change from Avelox to Cipro due to cost.    Cipro is NOT an appropriate medication for his infection.  If it's preferred on his plan, he can switch to LEVAQUIN 500 mg QD x 7 days OR LEVAQUIN 750 mg QD x 5 days.

## 2012-12-13 NOTE — Progress Notes (Signed)
Quick Note:  Spoke with patient informed him of results per Dr. Delton Coombes as listed below. Verbalized understanding and nothing further needed at this time. ______

## 2012-12-13 NOTE — Patient Instructions (Addendum)

## 2012-12-14 ENCOUNTER — Emergency Department (HOSPITAL_COMMUNITY): Payer: BC Managed Care – PPO

## 2012-12-14 ENCOUNTER — Emergency Department (HOSPITAL_COMMUNITY)
Admission: EM | Admit: 2012-12-14 | Discharge: 2012-12-15 | Disposition: A | Discharge status: Home / Self Care | Payer: BC Managed Care – PPO | Attending: Emergency Medicine | Admitting: Emergency Medicine

## 2012-12-14 ENCOUNTER — Encounter (HOSPITAL_COMMUNITY): Payer: Self-pay | Admitting: Emergency Medicine

## 2012-12-14 DIAGNOSIS — J449 Chronic obstructive pulmonary disease, unspecified: Secondary | ICD-10-CM | POA: Insufficient documentation

## 2012-12-14 DIAGNOSIS — R079 Chest pain, unspecified: Secondary | ICD-10-CM | POA: Insufficient documentation

## 2012-12-14 DIAGNOSIS — R059 Cough, unspecified: Secondary | ICD-10-CM | POA: Insufficient documentation

## 2012-12-14 DIAGNOSIS — R0602 Shortness of breath: Secondary | ICD-10-CM | POA: Insufficient documentation

## 2012-12-14 DIAGNOSIS — J45909 Unspecified asthma, uncomplicated: Secondary | ICD-10-CM | POA: Insufficient documentation

## 2012-12-14 DIAGNOSIS — J189 Pneumonia, unspecified organism: Secondary | ICD-10-CM | POA: Insufficient documentation

## 2012-12-14 DIAGNOSIS — Z79899 Other long term (current) drug therapy: Secondary | ICD-10-CM | POA: Insufficient documentation

## 2012-12-14 DIAGNOSIS — K219 Gastro-esophageal reflux disease without esophagitis: Secondary | ICD-10-CM | POA: Insufficient documentation

## 2012-12-14 DIAGNOSIS — I10 Essential (primary) hypertension: Secondary | ICD-10-CM | POA: Insufficient documentation

## 2012-12-14 DIAGNOSIS — J4489 Other specified chronic obstructive pulmonary disease: Secondary | ICD-10-CM | POA: Insufficient documentation

## 2012-12-14 DIAGNOSIS — R05 Cough: Secondary | ICD-10-CM | POA: Insufficient documentation

## 2012-12-14 DIAGNOSIS — F17200 Nicotine dependence, unspecified, uncomplicated: Secondary | ICD-10-CM | POA: Insufficient documentation

## 2012-12-14 LAB — CBC WITH DIFFERENTIAL/PLATELET
Basophils Absolute: 0 10*3/uL (ref 0.0–0.1)
Basophils Relative: 0 % (ref 0–1)
Eosinophils Absolute: 0 10*3/uL (ref 0.0–0.7)
Eosinophils Relative: 0 % (ref 0–5)
HCT: 46.8 % (ref 39.0–52.0)
Hemoglobin: 16.8 g/dL (ref 13.0–17.0)
Lymphocytes Relative: 5 % — ABNORMAL LOW (ref 12–46)
Lymphs Abs: 0.4 10*3/uL — ABNORMAL LOW (ref 0.7–4.0)
MCH: 33.9 pg (ref 26.0–34.0)
MCHC: 35.9 g/dL (ref 30.0–36.0)
MCV: 94.5 fL (ref 78.0–100.0)
Monocytes Absolute: 0.5 10*3/uL (ref 0.1–1.0)
Monocytes Relative: 6 % (ref 3–12)
Neutro Abs: 7.3 10*3/uL (ref 1.7–7.7)
Neutrophils Relative %: 89 % — ABNORMAL HIGH (ref 43–77)
Platelets: 149 10*3/uL — ABNORMAL LOW (ref 150–400)
RBC: 4.95 MIL/uL (ref 4.22–5.81)
RDW: 12.2 % (ref 11.5–15.5)
WBC: 8.2 10*3/uL (ref 4.0–10.5)

## 2012-12-14 LAB — BASIC METABOLIC PANEL
BUN: 13 mg/dL (ref 6–23)
CO2: 27 mEq/L (ref 19–32)
Calcium: 9 mg/dL (ref 8.4–10.5)
Chloride: 96 mEq/L (ref 96–112)
Creatinine, Ser: 0.91 mg/dL (ref 0.50–1.35)
GFR calc Af Amer: >90 mL/min (ref >90)
GFR calc non Af Amer: >90 mL/min (ref >90)
Glucose, Bld: 101 mg/dL — ABNORMAL HIGH (ref 70–99)
Potassium: 3.7 mEq/L (ref 3.5–5.1)
Sodium: 134 mEq/L — ABNORMAL LOW (ref 135–145)

## 2012-12-14 LAB — LACTIC ACID, PLASMA: Lactic Acid, Venous: 0.9 mmol/L (ref 0.5–2.2)

## 2012-12-14 LAB — TROPONIN I: Troponin I: <0.3 ng/mL (ref <0.30)

## 2012-12-14 MED ORDER — SODIUM CHLORIDE 0.9 % IV SOLN: Freq: Once | INTRAVENOUS | Status: DC

## 2012-12-14 MED ORDER — SODIUM CHLORIDE 0.9 % IV BOLUS (SEPSIS)
1000.0000 mL | Freq: Once | INTRAVENOUS | Status: AC
  Administered 2012-12-14: 1000 mL via INTRAVENOUS

## 2012-12-14 MED ORDER — ALBUTEROL SULFATE (5 MG/ML) 0.5% IN NEBU
5.0000 mg | INHALATION_SOLUTION | Freq: Once | RESPIRATORY_TRACT | Status: AC
  Administered 2012-12-14: 5 mg via RESPIRATORY_TRACT
  Filled 2012-12-14: qty 1

## 2012-12-14 MED ORDER — LEVOFLOXACIN 500 MG PO TABS: 500.0000 mg | ORAL_TABLET | Freq: Every day | ORAL | Status: DC

## 2012-12-14 MED ORDER — IPRATROPIUM BROMIDE 0.02 % IN SOLN
0.5000 mg | Freq: Once | RESPIRATORY_TRACT | Status: AC
  Administered 2012-12-14: 0.5 mg via RESPIRATORY_TRACT
  Filled 2012-12-14: qty 2.5

## 2012-12-14 MED ORDER — LEVOFLOXACIN IN D5W 500 MG/100ML IV SOLN
500.0000 mg | Freq: Once | INTRAVENOUS | Status: AC
  Administered 2012-12-14: 500 mg via INTRAVENOUS
  Filled 2012-12-14: qty 100

## 2012-12-14 MED ORDER — ACETAMINOPHEN 500 MG PO TABS: 500.0000 mg | ORAL_TABLET | Freq: Four times a day (QID) | ORAL | Status: DC | PRN

## 2012-12-14 NOTE — ED Provider Notes (Addendum)
History     CSN: [id number]  Arrival date & time 12/14/12  2040   First MD Initiated Contact with Patient 12/14/12 2052      Chief Complaint  Patient presents with  . Illness  . Fever    (Consider location/radiation/quality/duration/timing/severity/associated sxs/prior treatment) HPI Comments: Pt comes in with cc fevers, cough. Pt reports that she started feeling sick a couple of days back. He went down to an urgent care for evaluation, diagnosed with PNA and sent home with cipro. Pt reports taking 3 doses so far, but he continues to have fevers, cough, dib, chills, myalgias. He also has pleuritic chest pain. Pt has hx of COPD, not smoking over the past 3 days. He has no hx of diabetes, and is not immunocompromised. No recent admission. No illicits use, no alcohol abuse.  Patient is a 56 y.o. male presenting with general illness and fever. The history is provided by the patient.  Illness  Associated symptoms include a fever and cough. Pertinent negatives include no nausea, no vomiting, no headaches, no neck pain and no rash.  Fever Primary symptoms of the febrile illness include fever, cough and shortness of breath. Primary symptoms do not include headaches, nausea, vomiting, dysuria, arthralgias or rash.    Past Medical History  Diagnosis Date  . COPD (chronic obstructive pulmonary disease)   . SOB (shortness of breath)   . Bronchitis   . GERD (gastroesophageal reflux disease)   . Hypertension   . Pneumonia   . Sinusitis   . Allergy   . Asthma     Past Surgical History  Procedure Date  . Tonsillectomy     Family History  Problem Relation Age of Onset  . Lung cancer Father   . Brain cancer Father     History  Substance Use Topics  . Smoking status: Current Every Day Smoker -- 0.5 packs/day for 40 years    Types: Cigarettes  . Smokeless tobacco: Never Used  . Alcohol Use: No      Review of Systems  Constitutional: Positive for fever and chills. Negative  for activity change.  HENT: Negative for facial swelling, neck pain and neck stiffness.   Eyes: Negative for visual disturbance.  Respiratory: Positive for cough and shortness of breath. Negative for chest tightness.   Cardiovascular: Positive for chest pain.  Gastrointestinal: Negative for nausea, vomiting and abdominal distention.  Genitourinary: Negative for dysuria, enuresis and difficulty urinating.  Musculoskeletal: Negative for arthralgias.  Skin: Negative for rash.  Neurological: Negative for dizziness, light-headedness and headaches.  Hematological: Does not bruise/bleed easily.  Psychiatric/Behavioral: Negative for confusion.    Allergies  E-mycin and Penicillins  Home Medications   Current Outpatient Rx  Name  Route  Sig  Dispense  Refill  . ALBUTEROL SULFATE HFA 108 (90 BASE) MCG/ACT IN AERS   Inhalation   Inhale 2 puffs into the lungs every 6 (six) hours as needed for wheezing.   3 Inhaler   3   . ALPRAZOLAM 1 MG PO TABS               . AMLODIPINE BESYLATE 10 MG PO TABS   Oral   Take 1 tablet (10 mg total) by mouth daily. RAN OUT X 1 WEEK.BP 167/122   30 tablet   0   . CIPROFLOXACIN HCL 500 MG PO TABS   Oral   Take 500 mg by mouth 2 (two) times daily.         Marland Kitchen FLUTICASONE PROPIONATE  50 MCG/ACT NA SUSP   Nasal   Place 2 sprays into the nose daily.   16 g   2   . FLUTICASONE-SALMETEROL 250-50 MCG/DOSE IN AEPB   Inhalation   Inhale 1 puff into the lungs every 12 (twelve) hours.   180 each   3   . MOXIFLOXACIN HCL 400 MG PO TABS   Oral   Take 1 tablet (400 mg total) by mouth daily.   10 tablet   0   . OMEPRAZOLE 20 MG PO CPDR   Oral   Take 20 mg by mouth daily as needed. ACID REFLUX         . TIOTROPIUM BROMIDE MONOHYDRATE 18 MCG IN CAPS   Inhalation   Place 1 capsule (18 mcg total) into inhaler and inhale daily.   30 capsule   3     BP 164/99  Pulse 104  Temp 99.8 F (37.7 C) (Oral)  Resp 24  SpO2 95%  Physical Exam   Nursing note and vitals reviewed. Constitutional: He is oriented to person, place, and time. He appears well-developed.  HENT:  Head: Normocephalic and atraumatic.  Eyes: Conjunctivae normal and EOM are normal. Pupils are equal, round, and reactive to light.  Neck: Normal range of motion. Neck supple.  Cardiovascular: Normal rate, regular rhythm and normal heart sounds.   Pulmonary/Chest: Effort normal and breath sounds normal. No respiratory distress. He has no wheezes.       Diffuse rhonchus  Abdominal: Soft. Bowel sounds are normal. He exhibits no distension. There is no tenderness. There is no rebound and no guarding.  Neurological: He is alert and oriented to person, place, and time.  Skin: Skin is warm.    ED Course  Procedures (including critical care time)  Labs Reviewed  CBC WITH DIFFERENTIAL - Abnormal; Notable for the following:    Platelets 149 (*)     Neutrophils Relative 89 (*)     Lymphocytes Relative 5 (*)     Lymphs Abs 0.4 (*)     All other components within normal limits  BASIC METABOLIC PANEL  LACTIC ACID, PLASMA  TROPONIN I   Dg Chest 2 View  12/14/2012  *RADIOLOGY REPORT*  Clinical Data: Chest pain.  Shortness of breath.  Cough.  Fever. Smoker with current history of COPD.  CHEST - 2 VIEW  Comparison: Two-view chest x-ray 12/12/2012, 07/01/2011, 06/01/2008.  Findings: Interval development of a focal airspace opacity in the lingula since the examination 2 days ago.  This is superimposed upon moderate to severe central peribronchial thickening.  No confluent airspace consolidation elsewhere.  No pleural effusions. Cardiomediastinal silhouette unremarkable, unchanged.  IMPRESSION: Developing pneumonia in the lingula, superimposed upon moderate to severe changes of acute bronchitis and/or asthma.   Original Report Authenticated By: Hulan Saas, M.D.    Dg Chest 2 View  12/13/2012  *RADIOLOGY REPORT*  Clinical Data: Cough, congestion, chills  CHEST - 2 VIEW   Comparison: None.  Findings: Normal cardiac silhouette and mediastinal contours. The lungs appear hyperinflated with flattening of bilateral hemidiaphragms.  Heterogeneous air space opacities within the left mid lung.  No pleural effusion or pneumothorax.  No acute osseous abnormalities.  IMPRESSION: Hyperexpanded lungs with possible developing air space opacity within the left mid lung worrisome for infection.  A follow-up chest radiograph in 4 to 6 weeks after treatment is recommended to ensure resolution.  This was made a call report.   Original Report Authenticated By: Tacey Ruiz, MD  No diagnosis found.    MDM   Date: 12/14/2012  Rate: 105  Rhythm: sinus tachycardia  QRS Axis: normal  Intervals: normal  ST/T Wave abnormalities: nonspecific ST/T changes  Conduction Disutrbances:none  Narrative Interpretation:   Old EKG Reviewed: unchanged  Pt comes in with cc of cough, fevers. He was dx with PNA. FLU was negative, and sent home with cipro. Pt has taken 3 doses, and states that he continues to feel sick. He has no diabetes, and outside of COPD, no chronic conditions. His CURB65 score is 0, and his PORT score is 54. No risk for aspirations PNA.  Plan is to hydrate, give him a dose of love. Pt prefers levo over cipro, and i think he will get better coverage with that as well  - so we will switch him to levo, and have close f/u. Pt has COPD, but he is aox3, and is not in exacerbation. The repair Xray is worse, but i think that it might just be the course of disease. We will discuss good discharge instruction that should prompt them to return to the ER.    Derwood Kaplan, MD 12/14/12 [id number]  Derwood Kaplan, MD 12/14/12 [phone]

## 2012-12-14 NOTE — ED Notes (Signed)
NT could not find MD nanavati to give EKG to. Nanavati called- no answer. Will show MD EKG when I am able to locate him.

## 2012-12-14 NOTE — ED Notes (Signed)
Pt states he began having cold type symptoms and high fever of 102 on Tuesday. Went to Urgent Care yesterday where they dx of "bacterial infection in the lungs." Started pt on Cipro. Pt c/o fever, cough, and dyspnea.

## 2012-12-14 NOTE — ED Notes (Signed)
Pt from home with c/o cough, nausea and muscle aches. Pt denies emesis or diarrhea. Sts onset "a few" days ago. Seen at [facility] Monday and urgent care yesterday. Patient sts he was put on Cipro yesterday, sent home with diagnosis of "bacterial infection of the lung." Sts fever of 102.8 yesterday. Sts tylenol and ibuprofen are not effective for fever.

## 2012-12-15 NOTE — Telephone Encounter (Signed)
Forward to Dr. Cleta Alberts. FYI.  See ED note 12/14/2012.  Patient reported that he was seen here and evaluated for COPD exacerbation and "sent home on Cipro."  It does not appear that the ED provider reviewed the electronic record to see that the patient was given Avelox (though it does appear on the med list in the ED provider's note), nor the phone message that followed (advising him that his request for Cipro in this case was NOT appropriate, but that he could change to Levaquin if that was a preferred product on his prescription benefit plan).  New dx is pneumonia, and now on Levaquin.

## 2012-12-15 NOTE — ED Notes (Signed)
Patient given discharge instructions, information, prescriptions, and diet order. Patient states that they adequately understand discharge information given and to return to ED if symptoms return or worsen.     

## 2012-12-15 NOTE — Telephone Encounter (Signed)
Patient called [facility] and stated that he was afraid to take the Avelox or Levaquin. He stated he was willing to take Cipro but was afraid to take the other 2 drugs so his prescription was changed to Cipro 500 twice a day. He then went to the emergency room on [facility] for reevaluation and was started on Levaquin at that time. Patient was advised at that time he did start on Cipro recheck 48 hours if not improved

## 2012-12-15 NOTE — Telephone Encounter (Signed)
FYI, see chart, patient went to ER and this was given to him by the ER.

## 2012-12-16 ENCOUNTER — Telehealth: Payer: Self-pay | Admitting: Emergency Medicine

## 2012-12-16 NOTE — Telephone Encounter (Signed)
Called patient to check his status. He states he is improving slowly. I have encouraged him to rest drink lots of fluids and to let me know if he needs anything.

## 2012-12-16 NOTE — Telephone Encounter (Signed)
Please call patient and check on status. Be sure he is improving on medication. If he is not improving  he can return to clinic here for reevaluation or back to emergency room if he feels he needs to be admitted

## 2012-12-16 NOTE — Telephone Encounter (Signed)
Hello Dr. Cleta Alberts, [facility],  Robert Church was seen in the ER for PNA. He states that his sx were getting worse. I was aware that he was on cipro, he had taken 3 doses prior to ED arrival. He didn't "like" taking cipro, and wanted an alternative.  I must have missed the part about avelox on the pcp note (which i surely did read given it had bearing on his current visit), but must have missed the part on avelox.  Since his PNA looked worse, and he didn't like cipro, we though levoquine would provide better coverage and be a better alternative.  Sorry for the confusion.  Dyesha Henault N.

## 2012-12-19 ENCOUNTER — Telehealth: Payer: Self-pay

## 2012-12-19 ENCOUNTER — Encounter: Payer: Self-pay | Admitting: Physician Assistant

## 2012-12-19 NOTE — Telephone Encounter (Signed)
Patient request OOW note from Tuesday-Today. Pt will return to work tomorrow. Please call when ready at [phone]

## 2012-12-19 NOTE — Telephone Encounter (Signed)
Work note printed.  

## 2012-12-20 NOTE — Telephone Encounter (Signed)
Patient has gotten it.

## 2012-12-27 ENCOUNTER — Ambulatory Visit: Payer: BC Managed Care – PPO | Admitting: Emergency Medicine

## 2013-01-02 ENCOUNTER — Telehealth: Payer: Self-pay | Admitting: Emergency Medicine

## 2013-01-02 DIAGNOSIS — J449 Chronic obstructive pulmonary disease, unspecified: Secondary | ICD-10-CM

## 2013-01-02 MED ORDER — NEBULIZER DEVI: Status: DC

## 2013-01-02 NOTE — Telephone Encounter (Signed)
The pt says he was started on Albuterol nebs when seen in the ED on 12/14/12. He has been using this at work twice daily because they have a nebulizer machine there. He is requesting an order for a nebulizer machine and albuterol nebs be sent to his pharmacy so he can do this at home. He says the neb treatments have helped a whole lot with his wheezing and breathing. He is still using the Advair and Spiriva as well. RB, pls advise. Allergies  Allergen Reactions  . E-Mycin (Erythromycin Base) Swelling  . Penicillins Other (See Comments)    Pt took as a child and dr told him if he took again it would kill him.

## 2013-01-02 NOTE — Telephone Encounter (Signed)
OK to send referral/order to DME to get his own nebs machine as well as script for the albuterol. He needs an OV with me to review his status, see if there are other things to optimize since he is using rescue medication so frequently.

## 2013-01-02 NOTE — Telephone Encounter (Signed)
Pt aware we will send an order for the nebulizer machine and has asked that this be sent to [facility] on [address] In [place]. The pt states that the pharmacy has already checked and the insurance will cover it this way/.. The pt does not need an RX for the albuterol right now because this was sent from the hospital. He says he will get nebulizer supplies through his place of employment. Nebulizer RX sent to [facility].  Pt has been scheduled to see RB on Thurs., 01/05/13 @ 3:15.

## 2013-01-02 NOTE — Telephone Encounter (Signed)
Pt aware we have not received the form from [facility] yet. I will forward this msg to Lauren so she may look for this form and have RB sign once he is back in the office on Tues., 01/03/13. Pt still does not want to go through a DME company.

## 2013-01-03 NOTE — Telephone Encounter (Signed)
I spoke again with the pharmacy and was told by Abby that the pt will need to obtain the nebulizer machine through DME. She said that the pt had been misinformed by another pharmacy associate about being able to get this type of equipment there. Pt says to send the order to DME but he would like to either pick up the machine or have it sent through the mail if possible. Will make sure this is relayed to DME company.

## 2013-01-03 NOTE — Telephone Encounter (Signed)
[facility] was going to fax over a form for RB to fill out so pt can get the nebulizer machine through his insurance at the local pharmacy, instead of, the DME. Lauren, have you seen this form?

## 2013-01-03 NOTE — Telephone Encounter (Signed)
Pt would like to know what the status of this is.  Pt would like to p/u after work today.  Pt can be reached at [phone].  [facility]

## 2013-01-04 ENCOUNTER — Telehealth: Payer: Self-pay | Admitting: Emergency Medicine

## 2013-01-04 NOTE — Telephone Encounter (Signed)
Spoke with [facility]-states they got the order for nebulizer and tried to call the patient(wrong number on file at their office and did not leave a message). Nebulizer is at [facility] office [address]) patient can go by and pick up tomorrow. I spoke with patient and made him aware of this. Pt states he will go by the [facility] office tomorrow. If any questions or concerns patient will call our office to be of any assistance.

## 2013-01-05 ENCOUNTER — Encounter: Payer: Self-pay | Admitting: Emergency Medicine

## 2013-01-05 ENCOUNTER — Ambulatory Visit (INDEPENDENT_AMBULATORY_CARE_PROVIDER_SITE_OTHER): Payer: BC Managed Care – PPO | Admitting: Emergency Medicine

## 2013-01-05 VITALS — BP 138/88 | HR 88 | Temp 98.4°F | Ht 70.0 in | Wt 215.2 lb

## 2013-01-05 DIAGNOSIS — J449 Chronic obstructive pulmonary disease, unspecified: Secondary | ICD-10-CM

## 2013-01-05 DIAGNOSIS — G4730 Sleep apnea, unspecified: Secondary | ICD-10-CM

## 2013-01-05 DIAGNOSIS — F17200 Nicotine dependence, unspecified, uncomplicated: Secondary | ICD-10-CM

## 2013-01-05 DIAGNOSIS — Z77090 Contact with and (suspected) exposure to asbestos: Secondary | ICD-10-CM | POA: Insufficient documentation

## 2013-01-05 DIAGNOSIS — G4733 Obstructive sleep apnea (adult) (pediatric): Secondary | ICD-10-CM | POA: Insufficient documentation

## 2013-01-05 NOTE — Progress Notes (Signed)
Subjective:    Patient ID: [facility] Church, male    DOB: 1956/03/24, 57 y.o.   MRN: [id number] HPI 57 yo man, tobacco use ~38 yrs (>120 pk-yrs), also HTN. No hx asthma. Was evaluated by Dr Eden Emms and underwent cardiac stress testing that was reassuring.  Describes longstanding exertional SOB that has worsened over the last 2 yrs. He has occas cough. He does hear wheezing. He has had a SABA that he uses a few times a day. He has also been on Advair, symbicort before. He has been exposed to asbestos through [employer] on cars. He has had many exacerbations of apparent COPD, treated with pred/abx. Never hospitalized, never intubated.   ROV 09/18/11 -- heavy tobacco hx, COPD/asthma based on PFTs. Last time initiated trial Advair. CXR from 06/2011 with hyperinflation.  He feels that the Advair may be helping but only a little bit, no real change.  Takes ProAir at least 3 x a day. Still smoking 1/2 pk a day.   ROV 02/08/12 -- tobacco, COPD/asthma. He is having more dyspnea than last time, always worst in the am, produces thick yellow phlegm every am. Now down to 5 cigarettes. Using ProAir 5-6x a day.   ROV 03/09/12 -- tobacco, COPD/asthma. Still with dyspnea, using SABA frequently. Having allergy sx since warm weather started. Last time we started spiriva - he feels that he has benefited. He stopped the Advair to see if he would miss it. He doesn't believe he misses it.   ROV 07/05/12 -- tobacco, COPD/asthma. Also with allergic rhinitis and PND. He added back Advair to Spiriva since last time to see what it would do. He has since stopped Spiriva and changed from Pro-Air to Ventolin. He would like to stay on this regimen. He is smoking 1/2 pack a day. No exacerbations.   ROV 10/03/12 -- COPD/asthma. Allergies. Has been back on Advair for the last week, off spiriva. Has been having wakeups from sleep with dyspnea. On prilosec qd. Continues to have cough exacerbated by worsening allergies and postnasal drip. Continues  to smoke.   ROV 12/12/12 -- COPD/asthma. Allergies. Last time we continued Advair, started an allergy regimen - added nasal steroid, NSW, zyrtec, temporary increased PPI.  Returns today with no real change in his coughing spells. Cigarettes are down to 1/2 pack a day. He is having SOB when supine, gets awakened from sleep SOB, snores. He switched Advair to Spiriva about a weeks ago, hasn't really noticed a change.   ROV 01/05/13 -- COPD/asthma. Allergies. Last time we attempted to clarify regimen - spiriva + advair. He went to ED in Dec with cough, fever, LLL infiltrate. Treated with abx, was prescribed albuterol nebs. Currently smoking 7 a day, planning to quit tomorrow. He endorses sx consistent with OSA, wants to defer PSG for now.       Objective:   Physical Exam Filed Vitals:   01/05/13 1522  BP: 138/88  Pulse: 88  Temp: 98.4 F (36.9 C)   Gen: Pleasant, well-nourished, in no distress,  normal affect  ENT: No lesions,  mouth clear,  Very rare UA noise.   Neck: No JVD, no TMG, no carotid bruits  Lungs: No use of accessory muscles, some referred UA noise, no significant wheeze   Cardiovascular: RRR, heart sounds normal, no murmur or gallops, no peripheral edema  Musculoskeletal: No deformities, no cyanosis or clubbing  Neuro: alert, non focal  Skin: Warm, no lesions or rashes     Assessment & Plan:  Smoking Planning to stop on 1/17. I supported this, discussed strategies.   COPD (chronic obstructive pulmonary disease) - continue same BD regimen.  - smoking cessation   Sleep apnea Suspected sleep apnea - wants to defer PSg for now  Asbestos exposure Hx of exposure with no known sequela. Asked today about this. CXR has been OK. Will consider CT scan chest to screen in the future.

## 2013-01-05 NOTE — Assessment & Plan Note (Signed)
Suspected sleep apnea - wants to defer PSg for now

## 2013-01-05 NOTE — Patient Instructions (Addendum)
Please continue your same inhaled medications We will change your nebulizer medication to albuterol alone (instead of albuterol + ipratropium)  We will revisit scanning your chest since you have been exposed to asbestos in the past  We will revisit a sleep study in the future Stop smoking on 1/17 as planned! Follow with Dr Delton Coombes in 3 months or sooner if you have any problems.

## 2013-01-05 NOTE — Assessment & Plan Note (Signed)
-   continue same BD regimen.  - smoking cessation

## 2013-01-05 NOTE — Assessment & Plan Note (Signed)
Hx of exposure with no known sequela. Asked today about this. CXR has been OK. Will consider CT scan chest to screen in the future.

## 2013-01-05 NOTE — Assessment & Plan Note (Signed)
Planning to stop on 1/17. I supported this, discussed strategies.

## 2013-01-11 ENCOUNTER — Other Ambulatory Visit: Payer: Self-pay | Admitting: Physician Assistant

## 2013-01-16 ENCOUNTER — Telehealth: Payer: Self-pay | Admitting: Emergency Medicine

## 2013-01-16 MED ORDER — ALBUTEROL SULFATE (2.5 MG/3ML) 0.083% IN NEBU: 2.5000 mg | INHALATION_SOLUTION | Freq: Four times a day (QID) | RESPIRATORY_TRACT | Status: DC | PRN

## 2013-01-16 NOTE — Telephone Encounter (Signed)
Spoke with patient, patient states he has received neb machine but has not received the "solution that goes in the machine." Per OV note from last visit patient to start on Albuterol Neb.  Rx has been sent in to verified pharmacy. Nothing further needed at this time.  OV Note: 01/05/13 Patient Instructions     Please continue your same inhaled medications  We will change your nebulizer medication to albuterol alone (instead of albuterol + ipratropium)  We will revisit scanning your chest since you have been exposed to asbestos in the past  We will revisit a sleep study in the future  Stop smoking on 1/17 as planned!  Follow with Dr Delton Coombes in 3 months or sooner if you have any problems.

## 2013-02-28 ENCOUNTER — Telehealth: Payer: Self-pay | Admitting: Emergency Medicine

## 2013-02-28 NOTE — Telephone Encounter (Signed)
ATC patient x3. No return call back. Sent letter 02/28/13.  °

## 2013-03-02 ENCOUNTER — Ambulatory Visit (INDEPENDENT_AMBULATORY_CARE_PROVIDER_SITE_OTHER)
Admission: RE | Admit: 2013-03-02 | Discharge: 2013-03-02 | Disposition: A | Discharge status: Home / Self Care | Payer: BC Managed Care – PPO | Source: Ambulatory Visit | Attending: Pulmonary Disease | Admitting: Pulmonary Disease

## 2013-03-02 ENCOUNTER — Encounter: Payer: Self-pay | Admitting: *Deleted

## 2013-03-02 ENCOUNTER — Ambulatory Visit (INDEPENDENT_AMBULATORY_CARE_PROVIDER_SITE_OTHER): Payer: BC Managed Care – PPO | Admitting: Pulmonary Disease

## 2013-03-02 ENCOUNTER — Encounter: Payer: Self-pay | Admitting: Pulmonary Disease

## 2013-03-02 VITALS — BP 132/88 | HR 89 | Temp 98.1°F | Ht 70.5 in | Wt 209.8 lb

## 2013-03-02 DIAGNOSIS — R059 Cough, unspecified: Secondary | ICD-10-CM

## 2013-03-02 DIAGNOSIS — J441 Chronic obstructive pulmonary disease with (acute) exacerbation: Secondary | ICD-10-CM

## 2013-03-02 DIAGNOSIS — R05 Cough: Secondary | ICD-10-CM

## 2013-03-02 DIAGNOSIS — G4730 Sleep apnea, unspecified: Secondary | ICD-10-CM

## 2013-03-02 MED ORDER — LEVOFLOXACIN 500 MG PO TABS: ORAL_TABLET | ORAL | Status: DC

## 2013-03-02 NOTE — Patient Instructions (Addendum)
Levaquin 500 daily x 10 ds Flora-Q daily (OTC) probiotic CXR today

## 2013-03-02 NOTE — Progress Notes (Signed)
  Subjective:    Patient ID: Robert Church, male    DOB: February 24, 1956, 57 y.o.   MRN: [id number]  HPI 57 yo man, tobacco use ~38 yrs (>120 pk-yrs), also HTN. No hx asthma. Was evaluated by Dr Eden Emms and underwent cardiac stress testing that was reassuring. Describes longstanding exertional SOB that has worsened over the last 2 yrs. He has occas cough. He does hear wheezing. He has had a SABA that he uses a few times a day. He has also been on Advair, symbicort before. He has been exposed to asbestos through [employer] on cars. He has had many exacerbations of apparent COPD, treated with pred/abx. Never hospitalized, never intubated.   ROV 01/05/13 --  Last time we attempted to clarify regimen - spiriva + advair. He went to ED in Dec with cough, fever, LLL infiltrate. Treated with abx, was prescribed albuterol nebs. Currently smoking 7 a day, planning to quit tomorrow. He endorses sx consistent with OSA, wants to defer PSG for now.  >>CT chest for asbestosis, albuterol nebs   03/02/2013 FEV1 85% 8/12 pt c/o increased productive cough with yellow phlegm, increased SOB, chest congestion, wheezing x 4 days.  Had pneumonia around christmas '13 - reviewed CXrs - per pt, was 'missed' on CXR 12/24 Curious about disability CXR today - no infx He had levaquin left over which he has started taking x 2 ds   Review of Systems  neg for any significant sore throat, dysphagia, itching, sneezing, nasal congestion or excess/ purulent secretions, fever, chills, sweats, unintended wt loss, pleuritic or exertional cp, hempoptysis, orthopnea pnd or change in chronic leg swelling. Also denies presyncope, palpitations, heartburn, abdominal pain, nausea, vomiting, diarrhea or change in bowel or urinary habits, dysuria,hematuria, rash, arthralgias, visual complaints, headache, numbness weakness or ataxia.     Objective:   Physical Exam  Gen. Pleasant, well-nourished, in no distress ENT - no lesions, no post nasal  drip Neck: No JVD, no thyromegaly, no carotid bruits Lungs: no use of accessory muscles, no dullness to percussion, clear without rales, faint scattered rhonchi  Cardiovascular: Rhythm regular, heart sounds  normal, no murmurs or gallops, no peripheral edema Musculoskeletal: No deformities, no cyanosis or clubbing        Assessment & Plan:

## 2013-03-03 ENCOUNTER — Telehealth: Payer: Self-pay | Admitting: Pulmonary Disease

## 2013-03-03 ENCOUNTER — Encounter: Payer: Self-pay | Admitting: *Deleted

## 2013-03-03 DIAGNOSIS — J441 Chronic obstructive pulmonary disease with (acute) exacerbation: Secondary | ICD-10-CM | POA: Insufficient documentation

## 2013-03-03 DIAGNOSIS — J449 Chronic obstructive pulmonary disease, unspecified: Secondary | ICD-10-CM | POA: Insufficient documentation

## 2013-03-03 NOTE — Assessment & Plan Note (Addendum)
-  due to acute bronchitis with ? Component of sinusitis Levaquin 500 daily x 10 ds - since PCN allergic, longer duration for sinuses Flora-Q daily (OTC) probiotic  He does not seem to have significant COPD & I explained to him that he would  Not qualify for disability on this basis Doubt asbestosis is an issue here given clear CXR, no pleural plaques

## 2013-03-03 NOTE — Assessment & Plan Note (Signed)
He certainly has a high pre test probability due to narrow pharyngeal exam, loud snoring. Can  proceed with home study once acute issues resolved.

## 2013-03-03 NOTE — Telephone Encounter (Signed)
Pt calling again in ref to previous msg can be reached at [phone].Robert Church

## 2013-03-03 NOTE — Progress Notes (Signed)
Quick Note:  ATC pt, no answer. LMOMTCB ______

## 2013-03-03 NOTE — Progress Notes (Signed)
Quick Note:  Spoke with patient, made him aware of results/recs as listed below per Dr. Vassie Loll. Pt verbalized understanding and nothing further needed at this time.  ______

## 2013-03-03 NOTE — Telephone Encounter (Signed)
Spoke with patient, made him aware of results/recs as listed below per Dr. Vassie Loll. Pt verbalized understanding and nothing further needed at this time.  Notes Recorded by Oretha Milch, MD on 03/02/2013 at 5:23 PM No pneumonia on CXR- ct abx as discussed

## 2013-03-03 NOTE — Telephone Encounter (Signed)
Patient received letter at [employer] for work. Pt states he felt so bad today he could not work so now needs a note stating he is out of work until Monday. Note done, patient in lobby to pick it up and nothing further needed at this time.

## 2013-03-13 ENCOUNTER — Other Ambulatory Visit: Payer: Self-pay | Admitting: Physician Assistant

## 2013-03-14 ENCOUNTER — Other Ambulatory Visit: Payer: Self-pay | Admitting: Physician Assistant

## 2013-04-06 ENCOUNTER — Ambulatory Visit: Payer: BC Managed Care – PPO | Admitting: Pulmonary Disease

## 2013-04-13 ENCOUNTER — Other Ambulatory Visit: Payer: Self-pay | Admitting: Emergency Medicine

## 2013-04-13 ENCOUNTER — Telehealth: Payer: Self-pay

## 2013-04-13 NOTE — Telephone Encounter (Signed)
Pt called needs refill on prescription Xanax. Pt stated will come in to be seen sometime next week. Can not come before then Due to work schedule, any questions call pt at [phone]

## 2013-04-13 NOTE — Telephone Encounter (Signed)
Pt. Needs

## 2013-04-14 NOTE — Telephone Encounter (Signed)
Patient advised this was done for him yesterday.

## 2013-04-14 NOTE — Telephone Encounter (Signed)
Please check I thought I did this yesterday. If not go ahead and print  another prescription and I will sign it.

## 2013-04-15 ENCOUNTER — Telehealth: Payer: Self-pay

## 2013-04-15 NOTE — Telephone Encounter (Signed)
Patient calling to get his rx filled for xanax; says we  Sent it to wrong pharmacy and wants it to go to [facility]  ALPRAZolam Prudy Feeler) 1 MG tablet

## 2013-04-16 NOTE — Telephone Encounter (Signed)
Talked to patient 04/15/13 and got details. RX called in to wrong [facility]. Patient has not picked up so able to transfer.  [facility] was already closed so unable to have transferred. Called [facility] and had it taken care of. Left message for patient.

## 2013-05-08 ENCOUNTER — Ambulatory Visit (INDEPENDENT_AMBULATORY_CARE_PROVIDER_SITE_OTHER): Payer: BC Managed Care – PPO | Admitting: Adult Health

## 2013-05-08 ENCOUNTER — Telehealth: Payer: Self-pay | Admitting: Emergency Medicine

## 2013-05-08 ENCOUNTER — Encounter: Payer: Self-pay | Admitting: Adult Health

## 2013-05-08 VITALS — BP 124/82 | HR 78 | Temp 98.5°F | Ht 69.0 in | Wt 210.4 lb

## 2013-05-08 DIAGNOSIS — J441 Chronic obstructive pulmonary disease with (acute) exacerbation: Secondary | ICD-10-CM

## 2013-05-08 MED ORDER — PREDNISONE 10 MG PO TABS: ORAL_TABLET | ORAL | Status: DC

## 2013-05-08 NOTE — Telephone Encounter (Signed)
I spoke with the pt and he states he has been having increased SOB and chest tightness since this morning. Pt states that he went to take a shower this morning and became very SOB so he took his albuterol inhaler and this did not help so he used his nebulizer. Pt states he has not been able to get much relief with either and is requesting an appt today. Pt scheduled to see TP today at 4pm.  Carron Curie, CMA

## 2013-05-08 NOTE — Patient Instructions (Addendum)
Prednisone taper over next week.  Mucinex DM Twice daily  As needed  Cough/congestion  Fluids and rest  Please contact office for sooner follow up if symptoms do not improve or worsen or seek emergency care  Follow up Dr. Delton Coombes as planned

## 2013-05-09 NOTE — Assessment & Plan Note (Signed)
Exacerbation   Plan  Prednisone taper over next week.  Mucinex DM Twice daily  As needed  Cough/congestion  Fluids and rest  Please contact office for sooner follow up if symptoms do not improve or worsen or seek emergency care  Follow up Dr. Delton Coombes as planned

## 2013-05-09 NOTE — Progress Notes (Signed)
  Subjective:    Patient ID: Robert Church, male    DOB: January 28, 1956, 57 y.o.   MRN: [id number]  HPI  57 yo man, tobacco use ~38 yrs (>120 pk-yrs), also HTN. No hx asthma. Was evaluated by Dr Eden Emms and underwent cardiac stress testing that was reassuring. Describes longstanding exertional SOB that has worsened over the last 2 yrs. He has occas cough. He does hear wheezing. He has had a SABA that he uses a few times a day. He has also been on Advair, symbicort before. He has been exposed to asbestos through [employer] on cars. He has had many exacerbations of apparent COPD, treated with pred/abx. Never hospitalized, never intubated.   ROV 01/05/13 --  Last time we attempted to clarify regimen - spiriva + advair. He went to ED in Dec with cough, fever, LLL infiltrate. Treated with abx, was prescribed albuterol nebs. Currently smoking 7 a day, planning to quit tomorrow. He endorses sx consistent with OSA, wants to defer PSG for now.  >>CT chest for asbestosis, albuterol nebs   03/02/13  FEV1 85% 8/12 pt c/o increased productive cough with yellow phlegm, increased SOB, chest congestion, wheezing x 4 days.  Had pneumonia around christmas '13 - reviewed CXrs - per pt, was 'missed' on CXR 12/24 Curious about disability CXR today - no infx He had levaquin left over which he has started taking x 2 ds  05/08/13 Acute OV  Complains of increased SOB, burning in bronchials, dry cough, chest tightness x3days, worse this AM - denies f/c/s, mucus production.  currently smoking 2.5 cigs per day . No hemoptysis , chest pain , orthopnea, n/v/d or fever.  otc cold meds not working.    Review of Systems  neg for any significant sore throat, dysphagia, itching, sneezing, , sweats, unintended wt loss, pleuritic or exertional cp, hempoptysis, orthopnea pnd or change in chronic leg swelling. Also denies presyncope, palpitations, heartburn, abdominal pain, nausea, vomiting, diarrhea or change in bowel or urinary habits,  dysuria,hematuria, rash, arthralgias, visual complaints, headache, numbness weakness or ataxia.     Objective:   Physical Exam   Gen. Pleasant, well-nourished, in no distress ENT - no lesions, no post nasal drip Neck: No JVD, no thyromegaly, no carotid bruits Lungs: faint exp wheeze  Cardiovascular: Rhythm regular, heart sounds  normal, no murmurs or gallops, no peripheral edema Musculoskeletal: No deformities, no cyanosis or clubbing         Assessment & Plan:

## 2013-05-16 ENCOUNTER — Ambulatory Visit (INDEPENDENT_AMBULATORY_CARE_PROVIDER_SITE_OTHER): Payer: BC Managed Care – PPO | Admitting: Family Medicine

## 2013-05-16 VITALS — BP 140/90 | HR 82 | Temp 98.0°F | Resp 16 | Ht 70.5 in | Wt 209.0 lb

## 2013-05-16 DIAGNOSIS — R5381 Other malaise: Secondary | ICD-10-CM

## 2013-05-16 DIAGNOSIS — R5383 Other fatigue: Secondary | ICD-10-CM

## 2013-05-16 DIAGNOSIS — J449 Chronic obstructive pulmonary disease, unspecified: Secondary | ICD-10-CM

## 2013-05-16 DIAGNOSIS — F17200 Nicotine dependence, unspecified, uncomplicated: Secondary | ICD-10-CM

## 2013-05-16 DIAGNOSIS — R52 Pain, unspecified: Secondary | ICD-10-CM

## 2013-05-16 LAB — COMPREHENSIVE METABOLIC PANEL
ALT: 28 U/L (ref 0–53)
AST: 25 U/L (ref 0–37)
Albumin: 4.5 g/dL (ref 3.5–5.2)
Alkaline Phosphatase: 48 U/L (ref 39–117)
BUN: 19 mg/dL (ref 6–23)
CO2: 25 mEq/L (ref 19–32)
Calcium: 9.2 mg/dL (ref 8.4–10.5)
Chloride: 103 mEq/L (ref 96–112)
Creat: 0.92 mg/dL (ref 0.50–1.35)
Glucose, Bld: 127 mg/dL — ABNORMAL HIGH (ref 70–99)
Potassium: 4.4 mEq/L (ref 3.5–5.3)
Sodium: 136 mEq/L (ref 135–145)
Total Bilirubin: 0.8 mg/dL (ref 0.3–1.2)
Total Protein: 6.4 g/dL (ref 6.0–8.3)

## 2013-05-16 LAB — POCT CBC
Granulocyte percent: 77 %G (ref 37–80)
HCT, POC: 47.3 % (ref 43.5–53.7)
Hemoglobin: 15.5 g/dL (ref 14.1–18.1)
Lymph, poc: 0.9 (ref 0.6–3.4)
MCH, POC: 33.1 pg — AB (ref 27–31.2)
MCHC: 32.8 g/dL (ref 31.8–35.4)
MCV: 101.1 fL — AB (ref 80–97)
MID (cbc): 0.3 (ref 0–0.9)
MPV: 8.6 fL (ref 0–99.8)
POC Granulocyte: 4.2 (ref 2–6.9)
POC LYMPH PERCENT: 16.9 %L (ref 10–50)
POC MID %: 6.1 %M (ref 0–12)
Platelet Count, POC: 204 10*3/uL (ref 142–424)
RBC: 4.68 M/uL — AB (ref 4.69–6.13)
RDW, POC: 12.5 %
WBC: 5.5 10*3/uL (ref 4.6–10.2)

## 2013-05-16 LAB — POCT SEDIMENTATION RATE: POCT SED RATE: 2 mm/hr (ref 0–22)

## 2013-05-16 MED ORDER — METHYLPREDNISOLONE ACETATE 80 MG/ML IJ SUSP
120.0000 mg | Freq: Once | INTRAMUSCULAR | Status: AC
  Administered 2013-05-16: 120 mg via INTRAMUSCULAR

## 2013-05-16 MED ORDER — ALPRAZOLAM 1 MG PO TABS: ORAL_TABLET | ORAL | Status: DC

## 2013-05-16 NOTE — Patient Instructions (Addendum)
Consider adding in a vitamin B complex supplement and call as soon as possible to request a sleep study - either here or your pulmonologist.  [facility] Policy for Prescribing Controlled Substances (Revised 10/2012) 1. Prescriptions for controlled substances will be filled by ONE provider at [facility] with whom you have established and developed a plan for your care, including follow-up. 2. You are encouraged to schedule an appointment with your prescriber at our appointment center for follow-up visits whenever possible. 3. If you request a prescription for the controlled substance while at [facility] for an acute problem (with someone other than your regular prescriber), you MAY be given a ONE-TIME prescription for a 30-day supply of the controlled substance, to allow time for you to return to see your regular prescriber for additional prescriptions.  Chronic Obstructive Pulmonary Disease Chronic obstructive pulmonary disease (COPD) is a condition in which airflow from the lungs is restricted. The lungs can never return to normal, but there are measures you can take which will improve them and make you feel better. CAUSES   Smoking.  Exposure to secondhand smoke.  Breathing in irritants such as air pollution, dust, cigarette smoke, strong odors, aerosol sprays, or paint fumes.  History of lung infections. SYMPTOMS   Deep, persistent (chronic) cough with a large amount of thick mucus.  Wheezing.  Shortness of breath, especially with physical activity.  Feeling like you cannot get enough air.  Difficulty breathing.  Rapid breaths (tachypnea).  Gray or bluish discoloration (cyanosis) of the skin, especially in fingers, toes, or lips.  Fatigue.  Weight loss.  Swelling in legs, ankles, or feet.  Fast heartbeat (tachycardia).  Frequent lung infections.   Chest tightness. DIAGNOSIS  Initial diagnosis may be based on your history, symptoms, and physical examination. Additional tests for COPD  may include:  Chest X-ray.  Computed tomography (CT) scan.  Lung (pulmonary) function tests.  Blood tests. TREATMENT  Treatment focuses on making you comfortable (supportive care). Your caregiver may prescribe medicines (inhaled or pills) to help improve your breathing. Additional treatment options may include oxygen therapy and pulmonary rehabilitation. Treatment should also include reducing your exposure to known irritants and following a plan to stop smoking. HOME CARE INSTRUCTIONS   Take all medicines, including antibiotic medicines, as directed by your caregiver.  Use inhaled medicines as directed by your caregiver.  Avoid medicines or cough syrups that dry up your airway (antihistamines) and slow down the elimination of secretions. This decreases respiratory capacity and may lead to infections.  If you smoke, stop smoking.  Avoid exposure to smoke, chemicals, and fumes that aggravate your breathing.  Avoid contact with individuals that have a contagious illness.  Avoid extreme temperature and humidity changes.  Use humidifiers at home and at your bedside if they do not make breathing difficult.  Drink enough water and fluids to keep your urine clear or pale yellow. This loosens secretions.  Eat healthy foods. Eating smaller, more frequent meals and resting before meals may help you maintain your strength.  Ask your caregiver about the use of vitamins and mineral supplements.  Stay active. Exercise and physical activity will help maintain your ability to do things you want to do.  Balance activity with periods of rest.  Assume a position of comfort if you become short of breath.  Learn and use relaxation techniques.  Learn and use controlled breathing techniques as directed by your caregiver. Controlled breathing techniques include:  Pursed lip breathing. This breathing technique starts with breathing in (inhaling)  through your nose for 1 second. Next, purse your  lips as if you were going to whistle. Then breathe out (exhale) through the pursed lips for 2 seconds.  Diaphragmatic breathing. Start by putting one hand on your abdomen just above your waist. Inhale slowly through your nose. The hand on your abdomen should move out. Then exhale slowly through pursed lips. You should be able to feel the hand on your abdomen moving in as you exhale.  Learn and use controlled coughing to clear mucus from your lungs. Controlled coughing is a series of short, progressive coughs. The steps of controlled coughing are: 1. Lean your head slightly forward. 2. Breathe in deeply using diaphragmatic breathing. 3. Try to hold your breath for 3 seconds. 4. Keep your mouth slightly open while coughing twice. 5. Spit any mucus out into a tissue. 6. Rest and repeat the steps once or twice as needed.  Receive all protective vaccines your caregiver suggests, especially pneumococcal and influenza vaccines.  Learn to manage stress.  Schedule and attend all follow-up appointments as directed by your caregiver. It is important to keep all your appointments.  Participate in pulmonary rehabilitation as directed by your caregiver.  Use home oxygen as suggested. SEEK MEDICAL CARE IF:   You are coughing up more mucus than usual.  There is a change in the color or thickness of the mucus.  Breathing is more labored than usual.  Your breathing is faster than usual.  Your skin color is more cyanotic than usual.  You are running out of the medicine you take for your breathing.  You are anxious, apprehensive, or restless.  You have a fever. SEEK IMMEDIATE MEDICAL CARE IF:   You have a rapid heart rate.  You have shortness of breath while you are resting.  You have shortness of breath that prevents you from being able to talk.  You have shortness of breath that prevents you from performing your usual physical activities.  You have chest pain lasting longer than 5  minutes.  You have a seizure.  Your family or friends notice that you are agitated or confused. MAKE SURE YOU:   Understand these instructions.  Will watch your condition.  Will get help right away if you are not doing well or get worse. Document Released: 09/16/2005 Document Revised: 08/31/2012 Document Reviewed: 02/06/2011 [facility] Patient Information 2014 [place], [place].

## 2013-05-16 NOTE — Progress Notes (Signed)
Subjective:    Patient ID: Robert Church, male    DOB: 12/01/1956, 57 y.o.   MRN: [id number] Chief Complaint  Patient presents with  . Extremity Weakness    x 1 week  . Generalized Body Aches    HPI  Legs, shoulder, back pain for the past week, no energy.  A lot of nasal congestion and chest congestion.  Drinking a lot of gatorade.  In the mornings hard to get out of bed as he still feels so exhausted. Very nauseated in this morning but not usually. Very fatigued - napping more than normal. Does have undiagnosed sleep apnea.  Is sure he has it but has never had a sleep study as could only do on weekends.  Normal appetite other than this morning when he felt like he had to force himself to eat breakfast. No known sick contacts.  Coughing a little more than normal.  A lot of dust exposure. No f/c, no sweats, no changes in weight.  No change in sputum quantity or quality.  Using nebulizer machine bid which is his baseline along with the albuterol inhaler every 4 hours. Smoking 1/2 ppd - which is his baseline.  Saw pulmonology 1 wk ago for COPD exac and was prescribed a 8d prednisone taper (which he should still be on) but he couldn't take it - after just 2 pills his stomach got so upset that he did not take any further.  Does take a mvi daily.  Taking acetaminophen 500mg  every other day for joint pain - left shoulder is the worst.  Wants to be checked for mesothelioma with a chest CT due to asbestos exposure as a child.  Past Medical History  Diagnosis Date  . COPD (chronic obstructive pulmonary disease)   . SOB (shortness of breath)   . Bronchitis   . GERD (gastroesophageal reflux disease)   . Hypertension   . Pneumonia   . Sinusitis   . Allergy   . Asthma    Current Outpatient Prescriptions on File Prior to Visit  Medication Sig Dispense Refill  . acetaminophen (TYLENOL) 500 MG tablet Take 1 tablet (500 mg total) by mouth every 6 (six) hours as needed for pain.  30 tablet  0  .  albuterol (PROVENTIL HFA;VENTOLIN HFA) 108 (90 BASE) MCG/ACT inhaler Inhale 2 puffs into the lungs every 6 (six) hours as needed for wheezing.  3 Inhaler  3  . albuterol (PROVENTIL) (2.5 MG/3ML) 0.083% nebulizer solution Take 3 mLs (2.5 mg total) by nebulization every 6 (six) hours as needed for wheezing.  75 mL  12  . amLODipine (NORVASC) 10 MG tablet Take 10 mg by mouth daily.      . fluticasone (FLONASE) 50 MCG/ACT nasal spray Place 2 sprays into the nose daily.  16 g  2  . Fluticasone-Salmeterol (ADVAIR) 250-50 MCG/DOSE AEPB Inhale 1 puff into the lungs every 12 (twelve) hours.  180 each  3  . omeprazole (PRILOSEC) 20 MG capsule Take 20 mg by mouth daily. ACID REFLUX      . Respiratory Therapy Supplies (NEBULIZER) DEVI Use as directed  DX: 496  1 each  0  . tiotropium (SPIRIVA HANDIHALER) 18 MCG inhalation capsule Place 1 capsule (18 mcg total) into inhaler and inhale daily.  30 capsule  3  . predniSONE (DELTASONE) 10 MG tablet 4 tabs for 2 days, then 3 tabs for 2 days, 2 tabs for 2 days, then 1 tab for 2 days, then stop  20 tablet  0   No current facility-administered medications on file prior to visit.   Allergies  Allergen Reactions  . E-Mycin (Erythromycin Base) Swelling  . Penicillins Other (See Comments)    Pt took as a child and dr told him if he took again it would kill him.     Review of Systems  Constitutional: Positive for activity change and fatigue. Negative for fever, chills and appetite change.  HENT: Positive for congestion, rhinorrhea, neck stiffness, postnasal drip and sinus pressure. Negative for ear pain, sore throat, sneezing, trouble swallowing, neck pain and ear discharge.   Respiratory: Positive for apnea, cough and chest tightness. Negative for shortness of breath and wheezing.   Cardiovascular: Negative for chest pain.  Gastrointestinal: Positive for nausea. Negative for vomiting, abdominal pain, diarrhea and constipation.  Musculoskeletal: Positive for  myalgias, back pain and arthralgias. Negative for joint swelling and gait problem.  Hematological: Negative for adenopathy.  Psychiatric/Behavioral: Positive for sleep disturbance.      BP 140/90  Pulse 82  Temp(Src) 98 F (36.7 C) (Oral)  Resp 16  Ht 5' 10.5" (1.791 m)  Wt 209 lb (94.802 kg)  BMI 29.55 kg/m2  SpO2 96% Objective:   Physical Exam  Constitutional: He is oriented to person, place, and time. He appears well-developed and well-nourished. No distress.  HENT:  Head: Normocephalic and atraumatic. No trismus in the jaw.  Right Ear: Tympanic membrane, external ear and ear canal normal.  Left Ear: Tympanic membrane, external ear and ear canal normal.  Nose: Mucosal edema and rhinorrhea present.  Mouth/Throat: Uvula is midline and mucous membranes are normal. No edematous. Posterior oropharyngeal erythema present. No oropharyngeal exudate or posterior oropharyngeal edema.  Eyes: Conjunctivae are normal. No scleral icterus.  Neck: Normal range of motion. Neck supple. No thyromegaly present.  Cardiovascular: Normal rate, regular rhythm, normal heart sounds and intact distal pulses.   Pulmonary/Chest: Effort normal. No accessory muscle usage. Not tachypneic. No respiratory distress. He has no decreased breath sounds. He has wheezes in the left lower field. He has no rhonchi. He has no rales.  Musculoskeletal: He exhibits no edema.  Lymphadenopathy:    He has no cervical adenopathy.  Neurological: He is alert and oriented to person, place, and time.  Skin: Skin is warm and dry. He is not diaphoretic. No erythema.  Psychiatric: He has a normal mood and affect. His behavior is normal.      Results for orders placed in visit on 05/16/13  POCT CBC      Result Value Range   WBC 5.5  4.6 - 10.2 K/uL   Lymph, poc 0.9  0.6 - 3.4   POC LYMPH PERCENT 16.9  10 - 50 %L   MID (cbc) 0.3  0 - 0.9   POC MID % 6.1  0 - 12 %M   POC Granulocyte 4.2  2 - 6.9   Granulocyte percent 77.0  37 -  80 %G   RBC 4.68 (*) 4.69 - 6.13 M/uL   Hemoglobin 15.5  14.1 - 18.1 g/dL   HCT, POC 16.1  09.6 - 53.7 %   MCV 101.1 (*) 80 - 97 fL   MCH, POC 33.1 (*) 27 - 31.2 pg   MCHC 32.8  31.8 - 35.4 g/dL   RDW, POC 04.5     Platelet Count, POC 204  142 - 424 K/uL   MPV 8.6  0 - 99.8 fL    Assessment & Plan:  Possible OSA - pt offered referral  for a sleep study but declines for now - he will consider - can only do on the weekends.  Smoking  COPD (chronic obstructive pulmonary disease) - Plan: methylPREDNISolone acetate (DEPO-MEDROL) injection 120 mg - couldn't tolerate oral pred rx 1 wk prev by pulm for copd exac so IM x 1 given today. Cont maintenance inhalers.  Body aches - Plan: POCT CBC, Comprehensive metabolic panel, POCT SEDIMENTATION RATE, TSH, CK  Other malaise and fatigue - Plan: POCT CBC, Comprehensive metabolic panel, POCT SEDIMENTATION RATE, TSH, CK.  Poss vit B def due to macrocytosis - not anemic but likely with chronic tobacco abuse and COPD - normal baseline hgb may be a bit higher.  Consider starting b complex supp to see if helps energy levels  Insomnia - refilled xanax x 3 mos after review of [place] [facility] reviewed compliance. Discussed controlled sub refill policy - needs to sched f/u w/ Dr. Cleta Alberts for refills.  Meds ordered this encounter  Medications  . ALPRAZolam (XANAX) 1 MG tablet    Sig: TAKE 1 TABLET BY MOUTH EVERY NIGHT AT BEDTIME AS NEEDED    Dispense:  30 tablet    Refill:  2  . methylPREDNISolone acetate (DEPO-MEDROL) injection 120 mg    Sig:

## 2013-05-17 LAB — TSH: TSH: 0.639 u[IU]/mL (ref 0.350–4.500)

## 2013-05-17 LAB — CK: Total CK: 164 U/L (ref 7–232)

## 2013-06-19 ENCOUNTER — Ambulatory Visit (INDEPENDENT_AMBULATORY_CARE_PROVIDER_SITE_OTHER): Payer: BC Managed Care – PPO | Admitting: Internal Medicine

## 2013-06-19 ENCOUNTER — Encounter: Payer: Self-pay | Admitting: Internal Medicine

## 2013-06-19 VITALS — BP 134/80 | HR 78 | Temp 98.5°F | Ht 70.5 in | Wt 209.6 lb

## 2013-06-19 DIAGNOSIS — J45909 Unspecified asthma, uncomplicated: Secondary | ICD-10-CM

## 2013-06-19 DIAGNOSIS — J4520 Mild intermittent asthma, uncomplicated: Secondary | ICD-10-CM

## 2013-06-19 DIAGNOSIS — F17200 Nicotine dependence, unspecified, uncomplicated: Secondary | ICD-10-CM

## 2013-06-19 MED ORDER — BUDESONIDE-FORMOTEROL FUMARATE 160-4.5 MCG/ACT IN AERO: INHALATION_SPRAY | RESPIRATORY_TRACT | Status: DC

## 2013-06-19 NOTE — Patient Instructions (Addendum)
Plan A = automatic  symbicort 160 Take 2 puffs first thing in am and then another 2 puffs about 12 hours later.  Immediately after symibicort go ahead and use spiriva every am  Plan B = backup = ventolin 2puff up to every 4 hours if you can't catch your breath  Plan C = backs up B, only use your nebulizer afer your plan B  The key is to stop smoking completely before smoking completely stops you!   Work on inhaler technique:  relax and gently blow all the way out then take a nice smooth deep breath back in, triggering the inhaler at same time you start breathing in.  Hold for up to 5 seconds if you can.  Rinse and gargle with water when done   If your mouth or throat starts to bother you,   I suggest you time the inhaler to your dental care and after using the inhaler(s) brush teeth and tongue with a baking soda containing toothpaste and when you rinse this out, gargle with it first to see if this helps your mouth and throat.     Please schedule a follow up office visit in 4 weeks, sooner if needed to see Dr Delton Coombes re options for abetter plan A that you can afford

## 2013-06-19 NOTE — Progress Notes (Signed)
Subjective:    Patient ID: Robert Church, male    DOB: Oct 20, 1956, 57 y.o.   MRN: [id number]  HPI  57 yo man, tobacco use ~38 yrs (>120 pk-yrs), also HTN. No hx asthma. Was evaluated by Dr Eden Emms and underwent cardiac stress testing that was reassuring. Describes longstanding exertional SOB that has worsened over the last 2 yrs. He has occas cough. He does hear wheezing. He has had a SABA that he uses a few times a day. He has also been on Advair, symbicort before. He has been exposed to asbestos through [employer] on cars. He has had many exacerbations of apparent COPD, treated with pred/abx. Never hospitalized, never intubated.   ROV 01/05/13 --  Last time we attempted to clarify regimen - spiriva + advair. He went to ED in Dec with cough, fever, LLL infiltrate. Treated with abx, was prescribed albuterol nebs. Currently smoking 7 a day, planning to quit tomorrow. He endorses sx consistent with OSA, wants to defer PSG for now.  >>CT chest for asbestosis, albuterol nebs   03/02/13  FEV1 85% 8/12 pt c/o increased productive cough with yellow phlegm, increased SOB, chest congestion, wheezing x 4 days.  Had pneumonia around christmas '13 - reviewed CXrs - per pt, was 'missed' on CXR 12/24 Curious about disability CXR today - no infx He had levaquin left over which he has started taking x 2 ds  05/08/13 Acute OV  Complains of increased SOB, burning in bronchials, dry cough, chest tightness x3days, worse this AM - denies f/c/s, mucus production.  currently smoking 2.5 cigs per day . No hemoptysis , chest pain , orthopnea, n/v/d or fever.  otc cold meds not working.  rec Prednisone taper over next week.  Mucinex DM Twice daily  As needed  Cough/congestion    06/19/2013  Acute ov/Wert / still smoking Chief Complaint  Patient presents with  . Acute Visit    RB pt.  Increased SOB with activity x 1 wk, wheezing, and chest tightness. No cough, f/c/s.   abscessed tooth sev weeks, then developed  variable am mucus brown x one week > on abx dentist.  Uses inhalers after shower then saba with poor hfa.  No obvious pattern to day to day variabilty or   cp or   overt sinus or hb symptoms. No unusual exp hx or h/o childhood pna/ asthma or knowledge of premature birth.   Sleeping ok without nocturnal  or early am exacerbation  of respiratory  c/o's or need for noct saba. Also denies any obvious fluctuation of symptoms with weather or environmental changes or other aggravating or alleviating factors except as outlined above   Current Medications, Allergies, Past Medical History, Past Surgical History, Family History, and Social History were reviewed in [employer] record.  ROS  The following are not active complaints unless bolded sore throat, dysphagia, dental problems, itching, sneezing,  nasal congestion or excess/ purulent secretions, ear ache,   fever, chills, sweats, unintended wt loss, pleuritic or exertional cp, hemoptysis,  orthopnea pnd or leg swelling, presyncope, palpitations, heartburn, abdominal pain, anorexia, nausea, vomiting, diarrhea  or change in bowel or urinary habits, change in stools or urine, dysuria,hematuria,  rash, arthralgias, visual complaints, headache, numbness weakness or ataxia or problems with walking or coordination,  change in mood/affect or memory.               Objective:   Physical Exam  Wt Readings from Last 3 Encounters:  06/19/13 209 lb  9.6 oz (95.074 kg)  05/16/13 209 lb (94.802 kg)  05/08/13 210 lb 6.4 oz (95.437 kg)     Gen. Pleasant, well-nourished, in no distress with nl vital signs ENT - no lesions, no post nasal drip Neck: No JVD, no thyromegaly, no carotid bruits Lungs: bilateral mid exp wheeze with min increased t exp Cardiovascular: Rhythm regular, heart sounds  normal, no murmurs or gallops, no peripheral edema Musculoskeletal: No deformities, nl gait  Ext warm s calf tenderness, cyanosis or clubbing         Assessment & Plan:

## 2013-06-21 NOTE — Assessment & Plan Note (Signed)

## 2013-06-21 NOTE — Assessment & Plan Note (Addendum)
pft's normalize p saba 07/2011 so this is asthma, not copd  DDX of  difficult airways managment all start with A and  include Adherence, Ace Inhibitors, Acid Reflux, Active Sinus Disease, Alpha 1 Antitripsin deficiency, Anxiety masquerading as Airways dz,  ABPA,  allergy(esp in young), Aspiration (esp in elderly), Adverse effects of DPI,  Active smokers, plus two Bs  = Bronchiectasis and Beta blocker use..and one C= CHF   Adherence is always the initial "prime suspect" and is a multilayered concern that requires a "trust but verify" approach in every patient - starting with knowing how to use medications, especially inhalers, correctly, keeping up with refills and understanding the fundamental difference between maintenance and prns vs those medications only taken for a very short course and then stopped and not refilled. The proper method of use, as well as anticipated side effects, of a metered-dose inhaler are discussed and demonstrated to the patient. Improved effectiveness after extensive coaching during this visit to a level of approximately  75% so try symbicort 160 2bid and reviewed how to use saba hfa vs neb more effectively. See instructions for specific recommendations which were reviewed directly with the patient who was given a copy with highlighter outlining the key components.   ? Active sinus dz related to abscessed upper L incisor > rx per dental but may need ent/ sinus ct next.  ? Acid reflux > continue ppi  Active smoking > discussed separately

## 2013-07-06 ENCOUNTER — Ambulatory Visit (INDEPENDENT_AMBULATORY_CARE_PROVIDER_SITE_OTHER): Payer: BC Managed Care – PPO | Admitting: Emergency Medicine

## 2013-07-06 ENCOUNTER — Ambulatory Visit (INDEPENDENT_AMBULATORY_CARE_PROVIDER_SITE_OTHER)
Admission: RE | Admit: 2013-07-06 | Discharge: 2013-07-06 | Disposition: A | Discharge status: Home / Self Care | Payer: BC Managed Care – PPO | Source: Ambulatory Visit | Attending: Emergency Medicine | Admitting: Emergency Medicine

## 2013-07-06 ENCOUNTER — Encounter: Payer: Self-pay | Admitting: Emergency Medicine

## 2013-07-06 VITALS — BP 142/80 | HR 82 | Temp 98.3°F | Ht 70.0 in | Wt 210.2 lb

## 2013-07-06 DIAGNOSIS — J449 Chronic obstructive pulmonary disease, unspecified: Secondary | ICD-10-CM

## 2013-07-06 NOTE — Progress Notes (Signed)
Subjective:    Patient ID: Robert Church, male    DOB: Dec 08, 1956, 57 y.o.   MRN: [id number]  HPI 57 yo man, tobacco use ~38 yrs (>120 pk-yrs), also HTN. No hx asthma. Was evaluated by Dr Eden Emms and underwent cardiac stress testing that was reassuring. Describes longstanding exertional SOB that has worsened over the last 2 yrs. He has occas cough. He does hear wheezing. He has had a SABA that he uses a few times a day. He has also been on Advair, symbicort before. He has been exposed to asbestos through [employer] on cars. He has had many exacerbations of apparent COPD, treated with pred/abx. Never hospitalized, never intubated.   ROV 55/28/12 -- heavy tobacco hx, COPD/asthma based on PFTs. Last time initiated trial Advair. CXR from 06/2011 with hyperinflation. He feels that the Advair may be helping but only a little bit, no real change. Takes ProAir at least 3 x a day. Still smoking 1/2 pk a day.  ROV 55/18/13 -- tobacco, COPD/asthma. He is having more dyspnea than last time, always worst in the am, produces thick yellow phlegm every am. Now down to 5 cigarettes. Using ProAir 5-6x a day.  ROV 55/20/13 -- tobacco, COPD/asthma. Still with dyspnea, using SABA frequently. Having allergy sx since warm weather started. Last time we started spiriva - he feels that he has benefited. He stopped the Advair to see if he would miss it. He doesn't believe he misses it.  ROV 55/16/13 -- tobacco, COPD/asthma. Also with allergic rhinitis and PND. He added back Advair to Spiriva since last time to see what it would do. He has since stopped Spiriva and changed from Pro-Air to Ventolin. He would like to stay on this regimen. He is smoking 1/2 pack a day. No exacerbations.  ROV 55/14/13 -- COPD/asthma. Allergies. Has been back on Advair for the last week, off spiriva. Has been having wakeups from sleep with dyspnea. On prilosec qd. Continues to have cough exacerbated by worsening allergies and postnasal drip. Continues to smoke.   ROV 55/23/13 -- COPD/asthma. Allergies. Last time we continued Advair, started an allergy regimen - added nasal steroid, [place], zyrtec, temporary increased PPI. Returns today with no real change in his coughing spells. Cigarettes are down to 1/2 pack a day. He is having SOB when supine, gets awakened from sleep SOB, snores. He switched Advair to Spiriva about a weeks ago, hasn't really noticed a change.  ROV 55/16/14 -- COPD/asthma. Allergies. Last time we attempted to clarify regimen - spiriva + advair. He went to ED in Dec with cough, fever, LLL infiltrate. Treated with abx, was prescribed albuterol nebs. Currently smoking 7 a day, planning to quit tomorrow. He endorses sx consistent with OSA, wants to defer PSG for now.    ROV 55/16/14 --  Last time we attempted to clarify regimen - spiriva + advair. He went to ED in Dec with cough, fever, LLL infiltrate. Treated with abx, was prescribed albuterol nebs. Currently smoking 7 a day, planning to quit tomorrow. He endorses sx consistent with OSA, wants to defer PSG for now.  >>CT chest for asbestosis, albuterol nebs  55/13/14 --  FEV1 85% 8/12 pt c/o increased productive cough with yellow phlegm, increased SOB, chest congestion, wheezing x 4 days.  Had pneumonia around christmas '13 - reviewed CXrs - per pt, was 'missed' on CXR 12/24 Curious about disability CXR today - no infx He had levaquin left over which he has started taking x 2 ds  Acute visit 55/17/14 -- COPD, asbestos exposure. Has been seen in may and June '14 with apparent AE's. Acute visit today for dyspnea, something stuck in throat. He restarted spiriva, switched advair to symbicort. His SOB was better but he developed feeling on his throat. He is still smoking a few cig a day. Using vapor cig. He started levaquin 2 days ago for sinusitis.    Review of Systems     Objective:   Physical Exam Filed Vitals:   07/06/13 1120 07/06/13 1122  BP:  142/80  Pulse:  82  Temp: 98.3 F (36.8  C)   TempSrc: Oral   Height: 5\' 10"  (1.778 m)   Weight: 210 lb 3.2 oz (95.346 kg)   SpO2:  97%   Gen. Pleasant, well-nourished, in no distress ENT - no lesions, no post nasal drip Neck: No JVD, no thyromegaly, no carotid bruits Lungs: no use of accessory muscles, no dullness to percussion, clear without rales, faint scattered rhonchi  Cardiovascular: Rhythm regular, heart sounds  normal, no murmurs or gallops, no peripheral edema Musculoskeletal: No deformities, no cyanosis or clubbing       Assessment & Plan:  COPD (chronic obstructive pulmonary disease) Stay on spiriva  Will stop symbicort and start Breo qd Albuterol prn, explained roles of the meds.  Follow with Dr Delton Coombes in 1 month CXR today Finish the levaquin that he started

## 2013-07-06 NOTE — Assessment & Plan Note (Addendum)
Stay on spiriva  Will stop symbicort and start Breo qd Albuterol prn, explained roles of the meds.  Follow with Dr Delton Coombes in 1 month CXR today Finish the levaquin that he started

## 2013-07-06 NOTE — Patient Instructions (Addendum)
CXR today Finish the Levaquin that you have started. Take all 10 days We will start Breo 1 inhalation daily Take Spiriva once a day Do not take Advair or Symbicort Use Ventolin or nebulized albuterol as needed Follow with Dr Delton Coombes in 1 month

## 2013-07-10 ENCOUNTER — Telehealth: Payer: Self-pay | Admitting: Emergency Medicine

## 2013-07-10 NOTE — Telephone Encounter (Signed)
Result Notes    Notes Recorded by Tommie Sams, CMA on 07/07/2013 at 4:34 PM lmtcb x1 ------  Notes Recorded by Leslye Peer, MD on 07/06/2013 at 12:49 PM Please inform Robert Church that his CXR is normal. Thanks   Robert Church aware .

## 2013-07-11 ENCOUNTER — Telehealth: Payer: Self-pay | Admitting: Emergency Medicine

## 2013-07-11 NOTE — Telephone Encounter (Signed)
Notes Recorded by Leslye Peer, MD on 07/06/2013 at 12:49 PM Please inform pt that his CXR is normal. Thanks Pt advised.Carron Curie, CMA

## 2013-07-11 NOTE — Progress Notes (Signed)
Quick Note:  ATC patient, no answer [id number] ______ 

## 2013-07-20 ENCOUNTER — Ambulatory Visit (INDEPENDENT_AMBULATORY_CARE_PROVIDER_SITE_OTHER): Payer: BC Managed Care – PPO | Admitting: Emergency Medicine

## 2013-07-20 ENCOUNTER — Encounter: Payer: Self-pay | Admitting: Emergency Medicine

## 2013-07-20 VITALS — BP 140/82 | HR 82 | Temp 98.1°F | Ht 70.5 in | Wt 211.0 lb

## 2013-07-20 DIAGNOSIS — J449 Chronic obstructive pulmonary disease, unspecified: Secondary | ICD-10-CM

## 2013-07-20 NOTE — Assessment & Plan Note (Signed)
Keep using Breo (1 inhalation daily) and Spiriva once a day  Use nebulized albuterol as needed Coordinate the paperwork for financial assistance for Breo Get pneumococcal vaccine and flu vaccine this fall Keep working on quitting the cigarettes Follow with Dr Byrum in 4 months or sooner if you have any problems. 

## 2013-07-20 NOTE — Patient Instructions (Addendum)
Keep using Breo (1 inhalation daily) and Spiriva once a day  Use nebulized albuterol as needed Coordinate the paperwork for financial assistance for Breo Get pneumococcal vaccine and flu vaccine this fall Keep working on quitting the cigarettes Follow with Dr Delton Coombes in 4 months or sooner if you have any problems.

## 2013-07-20 NOTE — Progress Notes (Signed)
  Subjective:    Patient ID: Robert Church, male    DOB: [birth year], 57 y.o.   MRN: [id number]  HPI  Synopsis: [name] is a 57 yo male with history of COPD, GERD, HTN, and Sinusitis. Has history of heavy tobacco use and longstanding exertional SOB that has worsened over the last 2 years. Had an acute visit on 7/17 and was restarted on breo and spiriva.   ROV>>> 07/20/13 CC: Follow up for office visit. No complaints.  Taking the spiriva once a day and breo once a day and feels great on the combination. Using the albuterol very rarely. Smoking very little, not more than a pack a week. Chest Xray from 7/17 was negative for pneumonia or mass. States he has no breathing complaints. No fever, chest tightness, cough, nasal congestion or wheezing.   CHEST XRAY 07/06/13 IMPRESSION:  No active disease. No significant change.Findings: Cardiomediastinal silhouette is stable. No acute  infiltrate or pleural effusion. No pulmonary edema. Bony thorax  is stable.     Review of Systems     Objective:   Physical Exam  HEENT: Head is normocephalic and atraumatic. Extraocular muscles are intact. Pupils are equal, round, and reactive to light and accommodation. Nares appeared normal. Mouth is well hydrated and without lesions. Mucous membranes are moist. Posterior pharynx clear of any exudate or lesions. NECK: Supple. No carotid bruits.  No lymphadenopathy or thyromegaly. LUNGS: Clear to auscultation. HEART: Regular rate and rhythm without murmur. ABDOMEN: Soft, nontender, and nondistended.  Positive bowel sounds.  No hepatosplenomegaly was noted. EXTREMITIES: Without any cyanosis, clubbing, rash, lesions or edema. NEUROLOGIC: Cranial nerves II through XII are grossly intact. SKIN: No ulceration or induration present.      Assessment & Plan:  COPD (chronic obstructive pulmonary disease) Keep using Breo (1 inhalation daily) and Spiriva once a day  Use nebulized albuterol as needed Coordinate the  paperwork for financial assistance for Breo Get pneumococcal vaccine and flu vaccine this fall Keep working on quitting the cigarettes Follow with Dr Delton Coombes in 4 months or sooner if you have any problems.    I have seen and examined the patient. I agree with Trixie Deis and her assessment and plan as amended above.   Levy Pupa, MD, PhD 07/20/2013, 2:24 PM [facility] [phone] or if no answer [phone]

## 2013-08-09 ENCOUNTER — Ambulatory Visit: Payer: BC Managed Care – PPO | Admitting: Emergency Medicine

## 2013-08-10 ENCOUNTER — Encounter: Payer: Self-pay | Admitting: Emergency Medicine

## 2013-08-15 ENCOUNTER — Ambulatory Visit (INDEPENDENT_AMBULATORY_CARE_PROVIDER_SITE_OTHER): Payer: BC Managed Care – PPO | Admitting: Emergency Medicine

## 2013-08-15 ENCOUNTER — Encounter: Payer: Self-pay | Admitting: Emergency Medicine

## 2013-08-15 VITALS — BP 140/88 | HR 85 | Ht 70.5 in | Wt 211.2 lb

## 2013-08-15 DIAGNOSIS — J4489 Other specified chronic obstructive pulmonary disease: Secondary | ICD-10-CM

## 2013-08-15 DIAGNOSIS — J449 Chronic obstructive pulmonary disease, unspecified: Secondary | ICD-10-CM

## 2013-08-15 MED ORDER — ALBUTEROL SULFATE HFA 108 (90 BASE) MCG/ACT IN AERS: 2.0000 | INHALATION_SPRAY | Freq: Four times a day (QID) | RESPIRATORY_TRACT | Status: DC | PRN

## 2013-08-15 NOTE — Patient Instructions (Addendum)
Please continue your Breo and Spiriva on a schedule We will work on [employer] to [employer] and [employer] your ventolin as needed Follow with Dr Delton Coombes in 4 months or sooner if you have any problems.

## 2013-08-15 NOTE — Progress Notes (Signed)
  Subjective:    Patient ID: Robert Church, male    DOB: [birth year], 57 y.o.   MRN: [id number]  HPI Synopsis: Robert Church is a 57 yo male with history of COPD, GERD, HTN, and Sinusitis. Has history of heavy tobacco use and longstanding exertional SOB that has worsened over the last 2 years. Had an acute visit on 7/17 and was restarted on breo and spiriva.   ROV>>> 07/20/13 CC: Follow up for office visit. No complaints.  Taking the spiriva once a day and breo once a day and feels great on the combination. Using the albuterol very rarely. Smoking very little, not more than a pack a week. Chest Xray from 7/17 was negative for pneumonia or mass. States he has no breathing complaints. No fever, chest tightness, cough, nasal congestion or wheezing.   ROV 08/15/13 -- COPD, cough, rhinitis, continued tobacco - . He is doing well on Breo + Spiriva. He continues to do yard work, work on cars, but he can't do long walks.  He takes ventolin a few times a week.   CHEST XRAY 07/06/13 IMPRESSION:  No active disease. No significant change.Findings: Cardiomediastinal silhouette is stable. No acute  infiltrate or pleural effusion. No pulmonary edema. Bony thorax  is stable.     Review of Systems     Objective:   Physical Exam  HEENT: Head is normocephalic and atraumatic. Extraocular muscles are intact. Pupils are equal, round, and reactive to light and accommodation. Nares appeared normal. Mouth is well hydrated and without lesions. Mucous membranes are moist. Posterior pharynx clear of any exudate or lesions. NECK: Supple. No carotid bruits.  No lymphadenopathy or thyromegaly. LUNGS: Clear to auscultation. HEART: Regular rate and rhythm without murmur. ABDOMEN: Soft, nontender, and nondistended.  Positive bowel sounds.  No hepatosplenomegaly was noted. EXTREMITIES: Without any cyanosis, clubbing, rash, lesions or edema. NEUROLOGIC: Cranial nerves II through XII are grossly intact. SKIN: No ulceration or  induration present.      Assessment & Plan:  COPD (chronic obstructive pulmonary disease) Please continue your Breo and Spiriva on a schedule We will work on [employer] to [employer] and [employer] your ventolin as needed Need to continue to work on stopping smoking Follow with Dr Delton Coombes in 4 months or sooner if you have any problems

## 2013-08-15 NOTE — Assessment & Plan Note (Signed)
Please continue your Breo and Spiriva on a schedule We will work on [employer] to [employer] and [employer] your ventolin as needed Need to continue to work on stopping smoking Follow with Dr Delton Coombes in 4 months or sooner if you have any problems

## 2013-08-22 ENCOUNTER — Other Ambulatory Visit: Payer: Self-pay | Admitting: Nurse Practitioner

## 2013-08-22 ENCOUNTER — Ambulatory Visit
Admission: RE | Admit: 2013-08-22 | Discharge: 2013-08-22 | Disposition: A | Discharge status: Home / Self Care | Payer: Worker's Compensation | Source: Ambulatory Visit | Attending: Nurse Practitioner | Admitting: Nurse Practitioner

## 2013-08-22 DIAGNOSIS — S8991XA Unspecified injury of right lower leg, initial encounter: Secondary | ICD-10-CM

## 2013-08-31 ENCOUNTER — Other Ambulatory Visit: Payer: Self-pay | Admitting: Emergency Medicine

## 2013-09-03 ENCOUNTER — Ambulatory Visit (INDEPENDENT_AMBULATORY_CARE_PROVIDER_SITE_OTHER): Payer: BC Managed Care – PPO | Admitting: Emergency Medicine

## 2013-09-03 VITALS — BP 120/82 | HR 85 | Temp 98.0°F | Resp 17 | Ht 70.0 in | Wt 209.0 lb

## 2013-09-03 DIAGNOSIS — Z760 Encounter for issue of repeat prescription: Secondary | ICD-10-CM

## 2013-09-03 DIAGNOSIS — G4700 Insomnia, unspecified: Secondary | ICD-10-CM

## 2013-09-03 MED ORDER — ALPRAZOLAM 1 MG PO TABS: ORAL_TABLET | ORAL | Status: DC

## 2013-09-03 NOTE — Patient Instructions (Signed)

## 2013-09-03 NOTE — Progress Notes (Signed)
  Subjective:    Patient ID: Robert Church, male    DOB: 18-Sep-1956, 57 y.o.   MRN: [id number]  HPI  57 YO male patient here today for medication refills. Patient requesting Xanax. Patient takes the xanax at night to help with sleep. He reports he is doing very well with this medication.   Patient goes to see his pulmonologist every 2 months. Patient is still smoking but has decreased quantity. He recently ordered an electronic cigarette.   No prior colonoscopy.   Review of Systems     Objective:   Physical Exam patient is alert and cooperative he is not in distress. His neck is supple. Chest is clear. There is increased AP diameter. Heart regular rate without murmurs rubs or gallops. Abdomen is soft liver and spleen not large there are no areas of tenderness. Extremities without cyanosis clubbing or edema.        Assessment & Plan:  Pt advised to schedule a physical. Xanax was refilled for 6 months. He will continue his regular followup with his pulmonary specialist. He does need a complete physical.

## 2013-09-13 ENCOUNTER — Telehealth: Payer: Self-pay | Admitting: Emergency Medicine

## 2013-09-13 NOTE — Telephone Encounter (Signed)
Called spoke with patient, advised of RB's recs as stated below.  Pt stated that he has 9 tablets of the Levaquin and verbalized his understanding to take the rx to completion.  Pt will call back Friday or Monday to report back on symptoms and will call or seek emergency attention if symptoms worsen.  Nothing further needed at this time; will sign off.

## 2013-09-13 NOTE — Telephone Encounter (Signed)
Called spoke with patient who c/o head congestion with clear drainage, PND, prod cough with yellow mucus and some wheezing and SOB x3 days.  Pt denies any f/c/s, nausea, vomiting, hemoptysis, edema.  Does have an additional refill on a Levaquin 500mg  rx from when he "had pneumonia" and would like to know if he can take this medication.  Last ov 8.26.14 w/ RB [facility] [address] Allergies  Allergen Reactions  . E-Mycin [Erythromycin Base] Swelling  . Penicillins Other (See Comments)    Pt took as a child and dr told him if he took again it would kill him.   Dr Delton Coombes please advise, thank you.

## 2013-09-13 NOTE — Telephone Encounter (Signed)
Yes, tell him to take it to completion. Have him call us to say if he is improving. If not he needs an OV

## 2013-09-29 ENCOUNTER — Telehealth: Payer: Self-pay | Admitting: Emergency Medicine

## 2013-09-29 NOTE — Telephone Encounter (Signed)
Pt in lobby to get samples ° °

## 2013-09-29 NOTE — Telephone Encounter (Signed)
Pt given 2 boxes of breo

## 2013-10-03 NOTE — Telephone Encounter (Signed)
Meghan, has this been done yet?

## 2013-10-03 NOTE — Telephone Encounter (Signed)
I have these forms. They have been completed but I am waiting on RB to return to the office to sign.

## 2013-10-05 NOTE — Telephone Encounter (Signed)
RB signed these forms (pt assistance). Forms have been faxed.

## 2013-10-19 ENCOUNTER — Telehealth: Payer: Self-pay

## 2013-10-19 NOTE — Telephone Encounter (Signed)
Please advise 

## 2013-10-19 NOTE — Telephone Encounter (Signed)
[place] like for [place] to call him at [phone]

## 2013-10-20 NOTE — Telephone Encounter (Signed)
We are unable to refill Xanax prescriptions early. We can make sure that he has prescription that he can pick up on schedule as previous. If that is not acceptable he can come in and discuss the situation with one of the providers

## 2013-10-20 NOTE — Telephone Encounter (Signed)
Thanks I have called him to advise.  

## 2013-11-24 ENCOUNTER — Telehealth: Payer: Self-pay | Admitting: Emergency Medicine

## 2013-11-24 NOTE — Telephone Encounter (Signed)
I called [employer] patient assistance program to see if they received the forms that were faxed in Oct.  They did not get them so the pt is not currently enrolled. They will need a new application, income info, and rx. I do not see the paperwork scanned in and I checked [place] office as well.   [id number] to advise the pt of this. Carron Curie, CMA

## 2013-11-27 NOTE — Telephone Encounter (Signed)
lmtcb x2 

## 2013-11-28 NOTE — Telephone Encounter (Signed)
I spoke with the pt and advised that we need a new application. He states he has one with him and will drop this off. Also he states that he is being treated with levaquin for his sinus infection. Nothing further needed.Carron Curie, CMA

## 2014-01-04 ENCOUNTER — Other Ambulatory Visit: Payer: Self-pay | Admitting: *Deleted

## 2014-01-04 ENCOUNTER — Telehealth: Payer: Self-pay | Admitting: Emergency Medicine

## 2014-01-04 MED ORDER — TIOTROPIUM BROMIDE MONOHYDRATE 18 MCG IN CAPS: 18.0000 ug | ORAL_CAPSULE | Freq: Every day | RESPIRATORY_TRACT | Status: DC

## 2014-01-04 MED ORDER — FLUTICASONE FUROATE-VILANTEROL 100-25 MCG/INH IN AEPB: 1.0000 | INHALATION_SPRAY | Freq: Every day | RESPIRATORY_TRACT | Status: DC

## 2014-01-04 MED ORDER — ALBUTEROL SULFATE HFA 108 (90 BASE) MCG/ACT IN AERS: 2.0000 | INHALATION_SPRAY | Freq: Four times a day (QID) | RESPIRATORY_TRACT | Status: DC | PRN

## 2014-01-04 NOTE — Telephone Encounter (Signed)
Pt aware all PA for ventilin, dulera and spiriva have been signed by RB and faxed in today. Advised awaiting response and will notify once one is given.  Nothing further is needed at this time

## 2014-01-04 NOTE — Telephone Encounter (Signed)
RB has sign PA paperwork.  I am filling this out and will fax to appropriate place and put copy to be scanned. Will give original to [place] once filled out.  Nothing further is needed, awaiting approval or denial.

## 2014-01-11 ENCOUNTER — Telehealth: Payer: Self-pay | Admitting: Emergency Medicine

## 2014-01-11 MED ORDER — TIOTROPIUM BROMIDE MONOHYDRATE 18 MCG IN CAPS: 18.0000 ug | ORAL_CAPSULE | Freq: Every day | RESPIRATORY_TRACT | Status: DC

## 2014-01-11 MED ORDER — FLUTICASONE FUROATE-VILANTEROL 100-25 MCG/INH IN AEPB: 1.0000 | INHALATION_SPRAY | Freq: Every day | RESPIRATORY_TRACT | Status: DC

## 2014-01-11 NOTE — Telephone Encounter (Signed)
lmomtcb x1 for pt 

## 2014-01-11 NOTE — Telephone Encounter (Signed)
Pt has pending appt 01/12/14.  Called and spoke with pt. He had to cancel appt for tomorrow. He reports he can't make it. He is requesting sample of breo and spiriva. Will leave 1 sample of each upfront. Pt r/s his appt to 01/26/15 w/ RB.  Pt also c/o chest congestion, prod cough w/ yellow phlem, wheezing x couple days. He has tried mucinex and does not help. No fever, no chills, no sweats, no body aches. Pt is requesting RX for levaquin. Please advise RB thanks  Allergies  Allergen Reactions  . E-Mycin [Erythromycin Base] Swelling  . Penicillins Other (See Comments)    Pt took as a child and dr told him if he took again it would kill him.

## 2014-01-11 NOTE — Telephone Encounter (Signed)
Spoke with pt. He scheduled an appt to see TP tomorrow after since he has somewhere to be at 54. Nothing further needed

## 2014-01-11 NOTE — Telephone Encounter (Signed)
No, I'd like to see him, decide if he needs abx +/- prednisone, check CXR

## 2014-01-12 ENCOUNTER — Ambulatory Visit (INDEPENDENT_AMBULATORY_CARE_PROVIDER_SITE_OTHER): Payer: BC Managed Care – PPO | Admitting: Adult Health

## 2014-01-12 ENCOUNTER — Encounter: Payer: Self-pay | Admitting: Adult Health

## 2014-01-12 ENCOUNTER — Ambulatory Visit: Payer: Self-pay | Admitting: Emergency Medicine

## 2014-01-12 VITALS — BP 130/70 | HR 75 | Temp 98.6°F | Ht 70.0 in | Wt 214.0 lb

## 2014-01-12 DIAGNOSIS — J449 Chronic obstructive pulmonary disease, unspecified: Secondary | ICD-10-CM

## 2014-01-12 MED ORDER — LEVOFLOXACIN 500 MG PO TABS: 500.0000 mg | ORAL_TABLET | Freq: Every day | ORAL | Status: AC

## 2014-01-12 NOTE — Progress Notes (Signed)
  Subjective:    Patient ID: Robert Church, male    DOB: 1956/07/08, 58 y.o.   MRN: [id number]  HPI  Synopsis: Robert Church is a 58 yo male with history of COPD, GERD, HTN, and Sinusitis. Has history of heavy tobacco use and longstanding exertional SOB that has worsened over the last 2 years. Had an acute visit on 7/17 and was restarted on breo and spiriva.   ROV>>> 07/20/13 CC: Follow up for office visit. No complaints.  Taking the spiriva once a day and breo once a day and feels great on the combination. Using the albuterol very rarely. Smoking very little, not more than a pack a week. Chest Xray from 7/17 was negative for pneumonia or mass. States he has no breathing complaints. No fever, chest tightness, cough, nasal congestion or wheezing.   ROV 08/15/13 -- COPD, cough, rhinitis, continued tobacco - . He is doing well on Breo + Spiriva. He continues to do yard work, work on cars, but he can't do long walks.  He takes ventolin a few times a week.   CHEST XRAY 07/06/13 IMPRESSION:  No active disease. No significant change.Findings: Cardiomediastinal silhouette is stable. No acute  infiltrate or pleural effusion. No pulmonary edema. Bony thorax  is stable.   01/12/2014 Acute OV  Complains of prod cough with yellow mucus, head congestion, PND, wheezing, some chest tightness x5days.  Denies dyspnea, f/c/s, nausea, vomiting, edema, hemoptysis. No recent travel or abx use.  Remains on BREO  And Spiriva.  Continues on smoke , advised on smoking cessation.      Review of Systems Constitutional:   No  weight loss, night sweats,  Fevers, chills,  +fatigue, or  lassitude.  HEENT:   No headaches,  Difficulty swallowing,  Tooth/dental problems, or  Sore throat,                No sneezing, itching, ear ache,  +nasal congestion, post nasal drip,   CV:  No chest pain,  Orthopnea, PND, swelling in lower extremities, anasarca, dizziness, palpitations, syncope.   GI  No heartburn, indigestion,  abdominal pain, nausea, vomiting, diarrhea, change in bowel habits, loss of appetite, bloody stools.   Resp:   No chest wall deformity  Skin: no rash or lesions.  GU: no dysuria, change in color of urine, no urgency or frequency.  No flank pain, no hematuria   MS:  No joint pain or swelling.  No decreased range of motion.  No back pain.  Psych:  No change in mood or affect. No depression or anxiety.  No memory loss.          Objective:   Physical Exam GEN: A/Ox3; pleasant , NAD  HEENT:  [place]/AT,  EACs-clear, TMs-wnl, NOSE-clear, THROAT-clear, no lesions, no postnasal drip or exudate noted.   NECK:  Supple w/ fair ROM; no JVD; normal carotid impulses w/o bruits; no thyromegaly or nodules palpated; no lymphadenopathy.  RESP  Few rhonchi, no accessory muscle use, no dullness to percussion  CARD:  RRR, no m/r/g  , no peripheral edema, pulses intact, no cyanosis or clubbing.  GI:   Soft & nt; nml bowel sounds; no organomegaly or masses detected.  Musco: Warm bil, no deformities or joint swelling noted.   Neuro: alert, no focal deficits noted.    Skin: Warm, no lesions or rashes

## 2014-01-12 NOTE — Patient Instructions (Signed)
Levaquin 500mg  daily for 7 days Mucinex DM Twice daily  As needed  Cough/congestion  Continue on BREO and Spriva daily  May use Ventolin or Albuterol neb As needed  For wheezing  Fluids and rest.  Saline nasal rinses As needed   Please contact office for sooner follow up if symptoms do not improve or worsen or seek emergency care  follow up [name]  As planned and As needed

## 2014-01-12 NOTE — Assessment & Plan Note (Signed)
Exacerbation   Plan  Levaquin 500mg  daily for 7 days Mucinex DM Twice daily  As needed  Cough/congestion  Continue on BREO and Spriva daily  May use Ventolin or Albuterol neb As needed  For wheezing  Fluids and rest.  Saline nasal rinses As needed   Please contact office for sooner follow up if symptoms do not improve or worsen or seek emergency care  follow up [name]  As planned and As needed

## 2014-01-26 ENCOUNTER — Encounter: Payer: Self-pay | Admitting: Emergency Medicine

## 2014-01-26 ENCOUNTER — Ambulatory Visit (INDEPENDENT_AMBULATORY_CARE_PROVIDER_SITE_OTHER): Payer: BC Managed Care – PPO | Admitting: Emergency Medicine

## 2014-01-26 VITALS — BP 128/88 | HR 91 | Ht 70.5 in | Wt 215.0 lb

## 2014-01-26 DIAGNOSIS — J449 Chronic obstructive pulmonary disease, unspecified: Secondary | ICD-10-CM

## 2014-01-26 NOTE — Progress Notes (Signed)
  Subjective:    Patient ID: Robert Church, male    DOB: January 29, 1956, 58 y.o.   MRN: [id number]  HPI Synopsis: Pascal Stiggers is a 58 yo male with history of COPD, GERD, HTN, and Sinusitis. Has history of heavy tobacco use and longstanding exertional SOB that has worsened over the last 2 years. Had an acute visit on 7/17 and was restarted on breo and spiriva.   ROV>>> 07/20/13 CC: Follow up for office visit. No complaints.  Taking the spiriva once a day and breo once a day and feels great on the combination. Using the albuterol very rarely. Smoking very little, not more than a pack a week. Chest Xray from 7/17 was negative for pneumonia or mass. States he has no breathing complaints. No fever, chest tightness, cough, nasal congestion or wheezing.   ROV 08/15/13 -- COPD, cough, rhinitis, continued tobacco - . He is doing well on Breo + Spiriva. He continues to do yard work, work on cars, but he can't do long walks.  He takes ventolin a few times a week.   ROV 01/26/14 -- COPD, cough, rhinitis, continued tobacco. Was seen by TP in 1/'15 for an AE / bronchitis with levaquin. He has been maintained on Breo and Spiriva. Uses albuterol. He took the levaquin, may have cleared him some. 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 Smokes 1/2 pk a day. He doesn't want nasal steroid.     Review of Systems As per HPI     Objective:   Physical Exam Filed Vitals:   01/26/14 1520  BP: 128/88  Pulse: 91  Height: 5' 10.5" (1.791 m)  Weight: 215 lb (97.523 kg)  SpO2: 96%  Gen: Pleasant, well-nourished, in no distress,  normal affect  ENT: No lesions,  mouth clear,  oropharynx clear, no postnasal drip  Neck: No JVD, no TMG, no carotid bruits  Lungs: No use of accessory muscles, no dullness to percussion, clear without rales or rhonchi  Cardiovascular: RRR, heart sounds normal, no murmur or gallops, no peripheral edema  Musculoskeletal: No deformities, no cyanosis or clubbing  Neuro:  alert, non focal  Skin: Warm, no lesions or rashes      Assessment & Plan:  COPD (chronic obstructive pulmonary disease) Needs to stop smoking.  Same BD's Follow with Dr Lamonte Sakai in 3 months or sooner if you have any problems.

## 2014-01-26 NOTE — Patient Instructions (Signed)
Please continue your Breo and Spiriva Use albuterol as needed You need to stop smoking. Continue to work on this Follow with Dr Lamonte Sakai in 3 months or sooner if you have any problems.

## 2014-01-26 NOTE — Assessment & Plan Note (Signed)
Needs to stop smoking.  Same BD's Follow with Dr Lamonte Sakai in 3 months or sooner if you have any problems.

## 2014-01-31 ENCOUNTER — Ambulatory Visit: Payer: BC Managed Care – PPO

## 2014-01-31 ENCOUNTER — Ambulatory Visit (INDEPENDENT_AMBULATORY_CARE_PROVIDER_SITE_OTHER): Payer: BC Managed Care – PPO | Admitting: Family Medicine

## 2014-01-31 VITALS — BP 142/80 | HR 82 | Temp 99.1°F | Resp 18 | Ht 70.0 in | Wt 213.0 lb

## 2014-01-31 DIAGNOSIS — M79641 Pain in right hand: Secondary | ICD-10-CM

## 2014-01-31 DIAGNOSIS — M79609 Pain in unspecified limb: Secondary | ICD-10-CM

## 2014-01-31 MED ORDER — NAPROXEN 500 MG PO TABS: 500.0000 mg | ORAL_TABLET | Freq: Two times a day (BID) | ORAL | Status: DC

## 2014-01-31 MED ORDER — TRAMADOL HCL 50 MG PO TABS: 50.0000 mg | ORAL_TABLET | Freq: Two times a day (BID) | ORAL | Status: DC | PRN

## 2014-01-31 NOTE — Patient Instructions (Addendum)
Thank you for coming in today  No fracture on xray  Ice three times per day for 15 minutes  Take naproxen 2x per day with food for 7 days then as needed  Take tramadol as needed for pain  Followup 2 weeks if not improving  Wear brace for support

## 2014-01-31 NOTE — Progress Notes (Signed)
   Subjective:    Patient ID: Robert Church, male    DOB: Jun 28, 1956, 58 y.o.   MRN: [id number]  HPI Injured right hand on Saturday. Was using wrench forcefully doing Dealer work. No pain or swelling until Monday. No other known injury. Has a lot of pain and swelling. Just using ice, not taking anything. No previous injury to hand. No numbness or tingling.  Review of Systems  All other systems reviewed and are negative.      Objective:   Physical Exam  Constitutional: He is oriented to person, place, and time. He appears well-developed and well-nourished. No distress.  HENT:  Head: Normocephalic and atraumatic.  Eyes: No scleral icterus.  Cardiovascular: Normal rate.   Pulmonary/Chest: No respiratory distress.  Neurological: He is alert and oriented to person, place, and time.  Skin: Skin is warm and dry. He is not diaphoretic.  Psychiatric: He has a normal mood and affect. His behavior is normal.  Right hand: There is obvious dorsal swelling and ecchymosis. FROM in wrist without pain. Pain with ROM of middle finger. TTP greatest over 3rd MC > 2nd and 4th MC. No snuffbox or scaphoid TTP. No hook of hamate TTP. Normal perfusion.  [facility] reading (PRIMARY) by  Dr. Maryln Gottron. Negative for fracture or dislocation. No fracture of hook of hamate.     Assessment & Plan:  #1. Hand injury - Xrays negative - Wrist splint - Ice tid - Naproxen bid x 7 days - Tramadol prn - Relative rest - F/u 2 weeks if not improving and repeat films.

## 2014-02-04 ENCOUNTER — Telehealth: Payer: Self-pay | Admitting: Internal Medicine

## 2014-02-04 NOTE — Telephone Encounter (Signed)
Received call from patient that he never picked up his Rx for Levaquin originally prescribed in Jan. He has since seen [name] and no mention of re-Rxing in note. Patient to discuss utility of re-prescribing levaquin with [name] tomorrow.

## 2014-02-05 ENCOUNTER — Telehealth: Payer: Self-pay | Admitting: Emergency Medicine

## 2014-02-05 NOTE — Telephone Encounter (Signed)
Mr Wares needs to be seen. Please work in as a sick visit. Thanks

## 2014-02-05 NOTE — Telephone Encounter (Signed)
Left detailed message advising pt would need to been seen in office before he can get an rx. He can be seen by TP if RB has no available slots. Pt has been informed of all this info and that office is closing early to call tomorrow a.m

## 2014-02-05 NOTE — Telephone Encounter (Signed)
Pt reports that rx for Levaquin was never sent in on 01/12/14.  I spoke with pharmacist at [facility] and she shows that they did receive rx and it was picked up on 01/18/14.  Pt reports that he never picked up this rx.  Pt now c/o head and chest congestion - Prod cough (yellow) - Sinus Drainage (yellow- clear) - Some wheezing - Denies fever or SOB. - PT is requesting levaquin - Please advise Allergies  Allergen Reactions  . E-Mycin [Erythromycin Base] Swelling  . Penicillins Other (See Comments)    Pt took as a child and dr told him if he took again it would kill him.

## 2014-02-07 NOTE — Telephone Encounter (Signed)
Called spoke with patient, who reported that he did receive Meghan's message regarding acute visit with RB or TP Pt stated that he does not know his schedule for the rest of the week and stated that he is "taking something for it now" Pt stated he will call the first of next week to schedule appt -- advised pt to call when he is ready to schedule Will sign and forward to RB as FYI

## 2014-03-01 ENCOUNTER — Telehealth: Payer: Self-pay

## 2014-03-01 NOTE — Telephone Encounter (Signed)
Pt called again to check status of refill. i informed him of our phone policy of 83-25 hours for rx refills. Pt states he is totally out of his xanex and is working 7 days a week and cannot see dr Everlene Farrier for at least next two weeks.  i informed him of dr daubs walk in hours over the next few days and pt states he will call again tomorrow to check status of request.   Please call pt when ready  bf

## 2014-03-01 NOTE — Telephone Encounter (Signed)
Patient needs a refill on his xanax. Patient states he will be able to come in and see Dr. Everlene Farrier in 2 weeks. Please call patient when it is filled. Thank you.

## 2014-03-02 NOTE — Telephone Encounter (Signed)
He can have 1 refill on his Xanax

## 2014-03-04 MED ORDER — ALPRAZOLAM 1 MG PO TABS: ORAL_TABLET | ORAL | Status: DC

## 2014-03-04 NOTE — Telephone Encounter (Signed)
Called into pharm and pt notified. Will RTC within 4 weeks

## 2014-03-04 NOTE — Telephone Encounter (Signed)
The patient called again to check the status of his Xanax rx.  The patient states he will be unable to see Dr. Everlene Farrier for the next two weeks, but that he is out of this medication.  The patient requests return call at [phone]

## 2014-03-28 ENCOUNTER — Telehealth: Payer: Self-pay | Admitting: Emergency Medicine

## 2014-03-28 NOTE — Telephone Encounter (Signed)
Spoke with the pt  He is c/o increased SOB that he relates to pollen  Using rescue inhaler about twice per day  Requesting ov  OV with [place] for Friday at 9:15 am

## 2014-03-30 ENCOUNTER — Encounter: Payer: Self-pay | Admitting: Pulmonary Disease

## 2014-03-30 ENCOUNTER — Ambulatory Visit (INDEPENDENT_AMBULATORY_CARE_PROVIDER_SITE_OTHER): Payer: BC Managed Care – PPO | Admitting: Pulmonary Disease

## 2014-03-30 VITALS — BP 122/82 | HR 82 | Temp 98.3°F | Ht 70.0 in | Wt 214.8 lb

## 2014-03-30 DIAGNOSIS — J45901 Unspecified asthma with (acute) exacerbation: Secondary | ICD-10-CM | POA: Insufficient documentation

## 2014-03-30 DIAGNOSIS — J45909 Unspecified asthma, uncomplicated: Secondary | ICD-10-CM

## 2014-03-30 MED ORDER — PREDNISONE 10 MG PO TABS: ORAL_TABLET | ORAL | Status: DC

## 2014-03-30 NOTE — Progress Notes (Signed)
   Subjective:    Patient ID: Robert Church, male    DOB: May 05, 1956, 58 y.o.   MRN: [id number]  HPI The patient comes in today for an acute sick visit. He is normally followed by [name] for asthma, and gives a few day history of increasing sinus congestion and allergy symptoms, and as well as increasing shortness of breath with wheezing. He is not overly congested, but is producing some discolored mucus only in the mornings. This does not extend throughout the day. He primarily has worsening symptoms from the neck up, but it has worsened his breathing.    Review of Systems  Constitutional: Negative for fever and unexpected weight change.  HENT: Negative for congestion, dental problem, ear pain, nosebleeds, postnasal drip, rhinorrhea, sinus pressure, sneezing, sore throat and trouble swallowing.        Allergies  Eyes: Negative for redness and itching.  Respiratory: Positive for cough and shortness of breath. Negative for chest tightness and wheezing.        Congestion  Cardiovascular: Negative for palpitations and leg swelling.  Gastrointestinal: Negative for nausea and vomiting.  Genitourinary: Negative for dysuria.  Musculoskeletal: Negative for joint swelling.  Skin: Negative for rash.  Neurological: Negative for headaches.  Hematological: Does not bruise/bleed easily.  Psychiatric/Behavioral: Negative for dysphoric mood. The patient is not nervous/anxious.        Objective:   Physical Exam Overweight male in no acute distress Nose with congestion and erythematous mucosa, no purulence Oropharynx clear Neck without lymphadenopathy or thyromegaly Chest with a few rhonchi, no active wheezing Cardiac exam with regular rate and rhythm Lower extremities with no significant edema, no cyanosis Alert and oriented, moves all 4 extremities.       Assessment & Plan:

## 2014-03-30 NOTE — Patient Instructions (Signed)
Will treat with a course of prednisone to get you thru this episode. Take zyrtec 10mg  each am until allergy season is done Will hold off on an antibiotic for now, but call if you feel your congestion and mucus production is worsening. Stop smoking.  This is the most important treatment for your lung disease. Keep followup apptm with [name].

## 2014-03-30 NOTE — Assessment & Plan Note (Signed)
The patient's history is most consistent with a flare of his asthma associated with worsening allergy symptoms. I have asked him to continue on his current regimen, and will treat with a course of prednisone to get him through this episode. I have stressed to him the importance of total smoking cessation. I doubt that he has a pulmonary infection at this time, but he is to call us if he has worsening symptoms and we can call in an antibiotic.

## 2014-04-16 ENCOUNTER — Ambulatory Visit: Payer: BC Managed Care – PPO | Admitting: Cardiovascular Disease

## 2014-04-23 ENCOUNTER — Telehealth: Payer: Self-pay | Admitting: Emergency Medicine

## 2014-04-23 MED ORDER — TIOTROPIUM BROMIDE MONOHYDRATE 18 MCG IN CAPS: 18.0000 ug | ORAL_CAPSULE | Freq: Every day | RESPIRATORY_TRACT | Status: DC

## 2014-04-23 MED ORDER — FLUTICASONE FUROATE-VILANTEROL 100-25 MCG/INH IN AEPB: 1.0000 | INHALATION_SPRAY | Freq: Every day | RESPIRATORY_TRACT | Status: DC

## 2014-04-23 NOTE — Telephone Encounter (Signed)
Samples has been left for pick up.  Called pt lmtcb x1

## 2014-04-23 NOTE — Telephone Encounter (Signed)
Pt has returned call. °

## 2014-04-23 NOTE — Telephone Encounter (Signed)
Spoke with pt and advised that samples were at front desk for pick up.

## 2014-05-09 ENCOUNTER — Encounter: Payer: Self-pay | Admitting: Emergency Medicine

## 2014-05-09 ENCOUNTER — Ambulatory Visit (INDEPENDENT_AMBULATORY_CARE_PROVIDER_SITE_OTHER): Payer: BC Managed Care – PPO | Admitting: Emergency Medicine

## 2014-05-09 VITALS — BP 140/92 | HR 89 | Ht 70.0 in | Wt 214.0 lb

## 2014-05-09 DIAGNOSIS — J449 Chronic obstructive pulmonary disease, unspecified: Secondary | ICD-10-CM

## 2014-05-09 NOTE — Progress Notes (Signed)
  Subjective:    Patient ID: Robert Church, male    DOB: 10/17/1956, 58 y.o.   MRN: [id number]  HPI Synopsis: Robert Church is a 58 yo male with history of COPD, GERD, HTN, and Sinusitis. Has history of heavy tobacco use and longstanding exertional SOB that has worsened over the last 2 years. Had an acute visit on 7/17 and was restarted on breo and spiriva.   ROV>>> 07/20/13 CC: Follow up for office visit. No complaints.  Taking the spiriva once a day and breo once a day and feels great on the combination. Using the albuterol very rarely. Smoking very little, not more than a pack a week. Chest Xray from 7/17 was negative for pneumonia or mass. States he has no breathing complaints. No fever, chest tightness, cough, nasal congestion or wheezing.   ROV 08/15/13 -- COPD, cough, rhinitis, continued tobacco - . He is doing well on Breo + Spiriva. He continues to do yard work, work on cars, but he can't do long walks.  He takes ventolin a few times a week.   ROV 01/26/14 -- COPD, cough, rhinitis, continued tobacco. Was seen by TP in 1/'15 for an AE / bronchitis with levaquin. He has been maintained on Breo and Spiriva. Uses albuterol. He took the levaquin, may have cleared him some. 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 Smokes 1/2 pk a day. He doesn't want nasal steroid.   ROV 05/09/14 -- COPD, cough, rhinitis, continued tobacco. Follows after seeing [facility] in April for an Acute Flare.  He has been having progressive dyspnea, seems to be bothered by the heat and humidity. He been using fluticasone qd.  He is using breo + spiriva, using albuterol bid. He has morning cough. Still smoking.     Review of Systems As per HPI     Objective:   Physical Exam Filed Vitals:   05/09/14 1208  BP: 140/92  Pulse: 89  Height: 5\' 10"  (1.778 m)  Weight: 214 lb (97.07 kg)  SpO2: 94%  Gen: Pleasant, well-nourished, in no distress,  normal affect  ENT: No lesions,  mouth clear, clear nasal gtt,  UA noise +  Neck: No JVD, no TMG, no carotid bruits  Lungs: No use of accessory muscles, distant, soft exp wheeze and referred UA noise  Cardiovascular: RRR, heart sounds normal, no murmur or gallops, no peripheral edema  Musculoskeletal: No deformities, no cyanosis or clubbing  Neuro: alert, non focal  Skin: Warm, no lesions or rashes      Assessment & Plan:  COPD (chronic obstructive pulmonary disease) Please continue your spiriva and breo as you are taking them Increase your fluticasone to 2 sprays twice a day Start loratadine or zyrtec once a day.  Try doing nasal saline rinses once a day Use decongestants that contain chlorpheniramine as directed (OTC) Try hard to decrease you smoking - it is affecting your congestion and your breathing Follow with Dr Robert Church in 2 months or sooner if you have any problems

## 2014-05-09 NOTE — Assessment & Plan Note (Signed)
Please continue your spiriva and breo as you are taking them Increase your fluticasone to 2 sprays twice a day Start loratadine or zyrtec once a day.  Try doing nasal saline rinses once a day Use decongestants that contain chlorpheniramine as directed (OTC) Try hard to decrease you smoking - it is affecting your congestion and your breathing Follow with Dr Lamonte Sakai in 2 months or sooner if you have any problems

## 2014-05-09 NOTE — Patient Instructions (Signed)
Please continue your spiriva and breo as you are taking them Increase your fluticasone to 2 sprays twice a day Start loratadine or zyrtec once a day.  Try doing nasal saline rinses once a day Use decongestants that contain chlorpheniramine as directed (OTC) Try hard to decrease you smoking - it is affecting your congestion and your breathing Follow with Dr Shaquasha Gerstel in 2 months or sooner if you have any problems 

## 2014-05-15 ENCOUNTER — Ambulatory Visit (INDEPENDENT_AMBULATORY_CARE_PROVIDER_SITE_OTHER): Payer: BC Managed Care – PPO | Admitting: Cardiovascular Disease

## 2014-05-15 ENCOUNTER — Encounter: Payer: Self-pay | Admitting: Cardiovascular Disease

## 2014-05-15 VITALS — Ht 70.0 in | Wt 213.8 lb

## 2014-05-15 DIAGNOSIS — J449 Chronic obstructive pulmonary disease, unspecified: Secondary | ICD-10-CM

## 2014-05-15 DIAGNOSIS — R9439 Abnormal result of other cardiovascular function study: Secondary | ICD-10-CM

## 2014-05-15 DIAGNOSIS — R079 Chest pain, unspecified: Secondary | ICD-10-CM

## 2014-05-15 DIAGNOSIS — I10 Essential (primary) hypertension: Secondary | ICD-10-CM

## 2014-05-15 MED ORDER — LOSARTAN POTASSIUM 25 MG PO TABS: 25.0000 mg | ORAL_TABLET | Freq: Every day | ORAL | Status: DC

## 2014-05-15 NOTE — Patient Instructions (Signed)
Your physician recommends that you schedule a follow-up appointment in:  NEXT  AVAILABLE WITH DR [facility] Your physician has recommended you make the following change in your medication:    START LOSARTAN   [address] has requested that you have cardiac CT. Cardiac computed tomography (CT) is a painless test that uses an x-ray machine to take clear, detailed pictures of your heart. For further information please visit [email or web address]. Please follow instruction sheet as given.

## 2014-05-15 NOTE — Assessment & Plan Note (Signed)
Dyspnea appearst to be more from COPD  Continue inhaler  Sees Dr Lamonte Sakai

## 2014-05-15 NOTE — Progress Notes (Signed)
Patient ID: BARBARA KENG, male   DOB: 04-26-1956, 58 y.o.   MRN: [id number]     58 yo smoker referred for chest pain by Shelda Jakes NP [facility] .  Last seen in 2012 chest pain.  Myovue low risk but not totally normal  Overall Impression: Abnormal stress nuclear study. 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 However, the LV function is normal and the apex contracts normally which suggests that this is not an infarct. I cannot exclude the possibility of apical thinning to explain the defect since the data are somewhat conflicting. Echo:  EF 50-55%  Mild LAE  Chest pain the last few months  With fatigue and dyspnea.  Not always exertional Radiates to back on occasion.  Not positional not pleuritic  No relief with antacids  Lasts a few minutes.  Was given amlodipine for HTN a year ago and has had LE edema ever since  Wants a different BP med   Unable to walk on treadmill due to bilateral knee pain  Should have TKR but is scared to do this    ROS: Denies fever, malais, weight loss, blurry vision, decreased visual acuity, cough, sputum, SOB, hemoptysis, pleuritic pain, palpitaitons, heartburn, abdominal pain, melena, lower extremity edema, claudication, or rash.  All other systems reviewed and negative   General: Affect appropriate Healthy:  appears stated age 58: normal Neck supple with no adenopathy JVP normal no bruits no thyromegaly Lungs clear with no wheezing and good diaphragmatic motion Heart:  S1/S2 no murmur,rub, gallop or click PMI normal Abdomen: benighn, BS positve, no tenderness, no AAA no bruit.  No HSM or HJR Distal pulses intact with no bruits No edema Neuro non-focal Skin warm and dry No muscular weakness  Medications Current Outpatient Prescriptions  Medication Sig Dispense Refill  . albuterol (PROVENTIL HFA;VENTOLIN HFA) 108 (90 BASE) MCG/ACT inhaler Inhale 2 puffs into the lungs every 6 (six) hours as needed for  wheezing.  1 Inhaler  3  . ALPRAZolam (XANAX) 1 MG tablet TAKE 1 TABLET BY MOUTH EVERY NIGHT AT BEDTIME AS NEEDED  30 tablet  0  . amLODipine (NORVASC) 10 MG tablet Take 10 mg by mouth daily.      . Fluticasone Furoate-Vilanterol (BREO ELLIPTA) 100-25 MCG/INH AEPB Inhale 1 puff into the lungs daily.  2 each  0  . Multiple Vitamin (MULTIVITAMIN) tablet Take 1 tablet by mouth daily.      Marland Kitchen omeprazole (PRILOSEC) 20 MG capsule Take 20 mg by mouth daily. ACID REFLUX      . tiotropium (SPIRIVA) 18 MCG inhalation capsule Place 1 capsule (18 mcg total) into inhaler and inhale daily.  30 capsule  0   No current facility-administered medications for this visit.    Allergies E-mycin and Penicillins  Family History: Family History  Problem Relation Age of Onset  . Lung cancer Father   . Brain cancer Father     Social History: History   Social History  . Marital Status: Married    Spouse Name: N/A    Number of Children: N/A  . Years of Education: N/A   Occupational History  . Not on file.   Social History Main Topics  . Smoking status: Current Every Day Smoker -- 0.01 packs/day for 40 years    Types: Cigarettes  . Smokeless tobacco: Never Used     Comment: 1 pack per week--using electronic cig  . Alcohol Use: No  . Drug Use:  No  . Sexual Activity: Yes    Birth Control/ Protection: None   Other Topics Concern  . Not on file   Social History Narrative  . No narrative on file    Electrocardiogram:  SR RBBB nonspecific ST/T wave changes   Assessment and Plan

## 2014-05-15 NOTE — Assessment & Plan Note (Signed)
D/C amlodipine and start cozaar 25 mg

## 2014-05-15 NOTE — Assessment & Plan Note (Signed)
Chest pain with abnormal ECG  Symptoms somewhat atypical but previously abnormal myovue  Do not thin pretest likelyhood of disease high enough to warrant invasive heart cath.  Prefer cardiac CT if approved to risk stratify

## 2014-05-29 ENCOUNTER — Telehealth: Payer: Self-pay | Admitting: Emergency Medicine

## 2014-05-29 MED ORDER — ALBUTEROL SULFATE (2.5 MG/3ML) 0.083% IN NEBU: 2.5000 mg | INHALATION_SOLUTION | Freq: Four times a day (QID) | RESPIRATORY_TRACT | Status: DC | PRN

## 2014-05-29 NOTE — Telephone Encounter (Signed)
Pt request r/f on his abluterol nebs. This rx has been sent to pharm ok per RB. Pt aware this was done and nothing further needed

## 2014-06-04 ENCOUNTER — Encounter: Payer: Self-pay | Admitting: Cardiovascular Disease

## 2014-06-07 ENCOUNTER — Ambulatory Visit (HOSPITAL_COMMUNITY): Payer: BC Managed Care – PPO

## 2014-06-12 ENCOUNTER — Telehealth: Payer: Self-pay | Admitting: Internal Medicine

## 2014-06-12 DIAGNOSIS — J302 Other seasonal allergic rhinitis: Secondary | ICD-10-CM

## 2014-06-12 MED ORDER — FLUTICASONE PROPIONATE 50 MCG/ACT NA SUSP
2.0000 | Freq: Every day | NASAL | Status: DC
Start: 2014-06-12 — End: 2015-01-15

## 2014-06-12 NOTE — Telephone Encounter (Signed)
Patient wants nasal steroid . Does not want OTC. Flonase sent for this Robert Church patient of Dr Lamonte Sakai

## 2014-06-14 ENCOUNTER — Telehealth: Payer: Self-pay | Admitting: Emergency Medicine

## 2014-06-14 MED ORDER — PREDNISONE 10 MG PO TABS: ORAL_TABLET | ORAL | Status: DC

## 2014-06-14 MED ORDER — DOXYCYCLINE HYCLATE 100 MG PO TABS: 100.0000 mg | ORAL_TABLET | Freq: Two times a day (BID) | ORAL | Status: DC

## 2014-06-14 NOTE — Telephone Encounter (Signed)
Agree w seeing him next week, have him see TP.  Give pred taper > Take 40mg  daily for 3 days, then 30mg  daily for 3 days, then 20mg  daily for 3 days, then 10mg  daily for 3 days, then stop Give doxycycline 100mg  bid x 7 days

## 2014-06-14 NOTE — Telephone Encounter (Signed)
Called and spoke to pt. Pt stated he has had an increase in cough with yellow mucous production and constant chest tightness, pt also c/o an increase in SOB. S/s have been occuring for 3-4 days. Pt states he is taking his Breo and Spiriva once daily and albuterol when needed. Pt stated he is not taking anything else to help with symptoms. Pt was able to form complete sentences without the sound of dyspnea. Pt was requesting to be seen in the office before the weekend but there aren't any openings. Pt was last seen on the 05/09/2014 by RB for COPD. RB please advise.   Allergies  Allergen Reactions  . E-Mycin [Erythromycin Base] Swelling  . Penicillins Other (See Comments)    Pt took as a child and dr told him if he took again it would kill him.

## 2014-06-14 NOTE — Telephone Encounter (Signed)
Spoke with the pt and notified of recs per RB  No openings next wk  Pt does not want to wait  [place] had an opening for tomorrow and I have scheduled him for 9:15 am  Rxs were sent to pharm

## 2014-06-15 ENCOUNTER — Encounter: Payer: Self-pay | Admitting: Pulmonary Disease

## 2014-06-15 ENCOUNTER — Ambulatory Visit (INDEPENDENT_AMBULATORY_CARE_PROVIDER_SITE_OTHER): Payer: BC Managed Care – PPO | Admitting: Pulmonary Disease

## 2014-06-15 ENCOUNTER — Ambulatory Visit (INDEPENDENT_AMBULATORY_CARE_PROVIDER_SITE_OTHER)
Admission: RE | Admit: 2014-06-15 | Discharge: 2014-06-15 | Disposition: A | Discharge status: Home / Self Care | Payer: BC Managed Care – PPO | Source: Ambulatory Visit | Attending: Pulmonary Disease | Admitting: Pulmonary Disease

## 2014-06-15 ENCOUNTER — Encounter: Payer: Self-pay | Admitting: Emergency Medicine

## 2014-06-15 VITALS — BP 140/98 | HR 83 | Temp 97.8°F | Ht 70.0 in | Wt 211.4 lb

## 2014-06-15 DIAGNOSIS — J189 Pneumonia, unspecified organism: Secondary | ICD-10-CM

## 2014-06-15 DIAGNOSIS — J449 Chronic obstructive pulmonary disease, unspecified: Secondary | ICD-10-CM | POA: Insufficient documentation

## 2014-06-15 DIAGNOSIS — J441 Chronic obstructive pulmonary disease with (acute) exacerbation: Secondary | ICD-10-CM | POA: Insufficient documentation

## 2014-06-15 NOTE — Assessment & Plan Note (Signed)
The patient's history is most consistent with an acute COPD exacerbation. He is staying on a very good bronchodilator regimen, but unfortunately continues to smoke a few cigarettes in addition to his electronic cigarette.  A prescription for doxycycline and prednisone has been called into the pharmacy, but given his other symptoms, I would like to check a chest x-ray today to make sure that he does not have pneumonia or another acute process. If his chest x-ray is clear, he is to pick up his prescriptions and take the medication as directed. I've asked him to stay on his maintenance regimen, and to work very hard on 100% total smoking cessation.

## 2014-06-15 NOTE — Progress Notes (Signed)
   Subjective:    Patient ID: VUE PAVON, male    DOB: May 30, 1956, 58 y.o.   MRN: [id number]  HPI Patient comes in today for an acute sick visit. He has known COPD, and unfortunately continues to smoke. He has cut way back, and is using an electronic cigarette currently. He gives a 3 day history of worsening chest congestion, cough with purulent mucus, and increased shortness of breath. He also notes a burning in his chest anteriorly with some pressure, and this radiates between his shoulder blades at times. He has not had any fevers, chills, or sweats.   Review of Systems  Constitutional: Negative for fever and unexpected weight change.  HENT: Negative for congestion, dental problem, ear pain, nosebleeds, postnasal drip, rhinorrhea, sinus pressure, sneezing, sore throat and trouble swallowing.   Eyes: Negative for redness and itching.  Respiratory: Positive for cough, chest tightness, shortness of breath and wheezing.   Cardiovascular: Negative for palpitations and leg swelling.  Gastrointestinal: Negative for nausea and vomiting.  Genitourinary: Negative for dysuria.  Musculoskeletal: Negative for joint swelling.  Skin: Negative for rash.  Neurological: Negative for headaches.  Hematological: Does not bruise/bleed easily.  Psychiatric/Behavioral: Negative for dysphoric mood. The patient is not nervous/anxious.        Objective:   Physical Exam Overweight male in no acute distress Nose without purulence or discharge noted Neck without lymphadenopathy or thyromegaly Chest with rhonchi and wheezes throughout, but adequate airflow Cardiac exam with regular rate and rhythm Lower extremities without significant edema, no cyanosis Alert and oriented, moves all 4 extremities.       Assessment & Plan:

## 2014-06-15 NOTE — Patient Instructions (Signed)
Will check chest xray today, and call you with results.  If you do not have pneumonia, would pick up prescriptions called in and start today. Work on 100% smoking cessation. No change in daily meds. Keep followup with [name]

## 2014-06-25 ENCOUNTER — Ambulatory Visit (HOSPITAL_COMMUNITY): Admission: RE | Admit: 2014-06-25 | Payer: BC Managed Care – PPO | Source: Ambulatory Visit

## 2014-06-26 ENCOUNTER — Telehealth: Payer: Self-pay

## 2014-06-26 ENCOUNTER — Telehealth: Payer: Self-pay | Admitting: Emergency Medicine

## 2014-06-26 MED ORDER — PREDNISONE 10 MG PO TABS: ORAL_TABLET | ORAL | Status: DC

## 2014-06-26 NOTE — Telephone Encounter (Signed)
LMOMTCB x 1 

## 2014-06-26 NOTE — Telephone Encounter (Signed)
Lm for pt to RT call and make an appt to see Dr. Everlene Farrier. Also left his clinic hours for the next week.

## 2014-06-26 NOTE — Telephone Encounter (Signed)
[phone] calling back

## 2014-06-26 NOTE — Telephone Encounter (Signed)
Pt returned call

## 2014-06-26 NOTE — Telephone Encounter (Signed)
lmomtcb x1 

## 2014-06-26 NOTE — Telephone Encounter (Signed)
PT IS UNABLE TO COME IN UNTIL 07/15/14, BUT WOULD LIKE TO KNOW IF DR.DAUB CAN REFILL HIS XANAX UNTIL HE IS ABLE TO COME IN. BEST# [id number]

## 2014-06-26 NOTE — Telephone Encounter (Signed)
I suspect that he will continue to have off and on sx similar to these as long as he smokes. He will need to make quitting a priority or we are going to run out of treatment options. For now, it is ok with me to extend his prednisone taper. I predict that this will give him some temporary relief.   Pred - Take 40mg  daily for 3 days, then 30mg  daily for 3 days, then 20mg  daily for 3 days, then 10mg  daily for 3 days, then stop

## 2014-06-26 NOTE — Telephone Encounter (Signed)
Pt returned call. Pt stated that he is still having chest congestion. Pt c/o chest tightness in the morning and is coughing throughout the day and is producing yellow mucous and then will change to clear and then back to yellow mucous. Pt states his chest tightness resolves during the middle of the day and then comes back during the end of the day. Pt c/o rhinorrhea, DOE and fatigue. Pt able to complete sentences when talking on the phone. Pt states s/s have been occuring for 2 weeks. Pt saw [facility] on 6/26 with CXR and was given Doxy, pt finished the rx and is till taking the pred taper, pt currently taking 1 tablet daily and has two tablets left of taper. Pt taking flonase qd and his maintanence inhalers as prescribed, pt not taking any other OTC meds. There are no openings in the office today.   RB please advise.

## 2014-06-26 NOTE — Telephone Encounter (Signed)
lmtcb

## 2014-06-26 NOTE — Telephone Encounter (Signed)
Called and spoke to pt. Informed pt of recs per RB. Pt verbalized understanding and denied any further questions or concerns at this time. Rx sent to preferred pharmacy.

## 2014-06-26 NOTE — Telephone Encounter (Signed)
Pt returned call.  Holly D Pryor ° °

## 2014-07-13 ENCOUNTER — Ambulatory Visit: Payer: Self-pay | Admitting: Emergency Medicine

## 2014-07-24 ENCOUNTER — Ambulatory Visit: Payer: BC Managed Care – PPO | Admitting: Cardiovascular Disease

## 2014-07-25 ENCOUNTER — Encounter: Payer: Self-pay | Admitting: Cardiovascular Disease

## 2014-08-09 ENCOUNTER — Telehealth: Payer: Self-pay | Admitting: Emergency Medicine

## 2014-08-09 MED ORDER — TIOTROPIUM BROMIDE MONOHYDRATE 18 MCG IN CAPS: 18.0000 ug | ORAL_CAPSULE | Freq: Every day | RESPIRATORY_TRACT | Status: DC

## 2014-08-09 NOTE — Telephone Encounter (Signed)
Spoke with pt and advised that samples of Spiriva were left at front desk for pick up.

## 2014-08-13 ENCOUNTER — Ambulatory Visit: Payer: Self-pay | Admitting: Emergency Medicine

## 2014-08-23 ENCOUNTER — Ambulatory Visit (INDEPENDENT_AMBULATORY_CARE_PROVIDER_SITE_OTHER): Payer: BC Managed Care – PPO | Admitting: Emergency Medicine

## 2014-08-23 ENCOUNTER — Encounter: Payer: Self-pay | Admitting: Emergency Medicine

## 2014-08-23 VITALS — BP 142/78 | HR 89 | Temp 98.2°F | Ht 70.5 in | Wt 213.4 lb

## 2014-08-23 DIAGNOSIS — J411 Mucopurulent chronic bronchitis: Secondary | ICD-10-CM

## 2014-08-23 MED ORDER — AZELASTINE-FLUTICASONE 137-50 MCG/ACT NA SUSP: NASAL | Status: DC

## 2014-08-23 MED ORDER — LEVOFLOXACIN 500 MG PO TABS: 500.0000 mg | ORAL_TABLET | Freq: Every day | ORAL | Status: DC

## 2014-08-23 NOTE — Progress Notes (Signed)
Subjective:    Patient ID: Robert Church, male    DOB: [birth year], 58 y.o.   MRN: [id number]  HPI Synopsis: Robert Church is a 58 yo male with history of COPD, GERD, HTN, and Sinusitis. Has history of heavy tobacco use and longstanding exertional SOB that has worsened over the last 2 years. Had an acute visit on 7/17 and was restarted on breo and spiriva.   ROV>>> 07/20/13 CC: Follow up for office visit. No complaints.  Taking the spiriva once a day and breo once a day and feels great on the combination. Using the albuterol very rarely. Smoking very little, not more than a pack a week. Chest Xray from 7/17 was negative for pneumonia or mass. States he has no breathing complaints. No fever, chest tightness, cough, nasal congestion or wheezing.   ROV 08/15/13 -- COPD, cough, rhinitis, continued tobacco - . He is doing well on Breo + Spiriva. He continues to do yard work, work on cars, but he can't do long walks.  He takes ventolin a few times a week.   ROV 01/26/14 -- COPD, cough, rhinitis, continued tobacco. Was seen by TP in 1/'15 for an AE / bronchitis with levaquin. He has been maintained on Breo and Spiriva. Uses albuterol. He took the levaquin, may have cleared him some. 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 Smokes 1/2 pk a day. He doesn't want nasal steroid.   ROV 05/09/14 -- COPD, cough, rhinitis, continued tobacco. Follows after seeing [facility] in April for an Acute Flare.  He has been having progressive dyspnea, seems to be bothered by the heat and humidity. He been using fluticasone qd.  He is using breo + spiriva, using albuterol bid. He has morning cough. Still smoking.   ROV 08/23/13 -- COPD, cough, rhinitis, continued tobacco use. He was seen by [name] since last time and treated for another acute flare. He has intermittent chest tightness. He has been having nasal congestion, esp at night, has been waking up at night to cough mucous. He gets SOB with any exertion.      Review of Systems As per HPI     Objective:   Physical Exam Filed Vitals:   08/23/14 0924  BP: 142/78  Pulse: 89  Temp: 98.2 F (36.8 C)  TempSrc: Oral  Height: 5' 10.5" (1.791 m)  Weight: 213 lb 6.4 oz (96.798 kg)  SpO2: 94%  Gen: Pleasant, well-nourished, in no distress,  normal affect  ENT: No lesions,  mouth clear, clear nasal gtt, UA noise +  Neck: No JVD, no TMG, no carotid bruits  Lungs: No use of accessory muscles, distant, soft exp wheeze and referred UA noise  Cardiovascular: RRR, heart sounds normal, no murmur or gallops, no peripheral edema  Musculoskeletal: No deformities, no cyanosis or clubbing  Neuro: alert, non focal  Skin: Warm, no lesions or rashes      Assessment & Plan:  COPD (chronic obstructive pulmonary disease) Without apparent acute bronchitis exacerbated by an allergy flare. He continues to smoke and I suspect that his recurrent symptoms her not going to abate until he stops.   Please take levofloxacin 500 mg daily for the next 5 days Continue your Breo and Spiriva Use albuterol as needed for shortness of breath or wheezing Stopped her fluticasone nose spray for now. We will start Dymista 1 spray each nostril twice a day Start doing nasal saline washes daily You need to stop smoking completely. Your symptoms  are not going to change until we are able to accomplish this.  Follow with Dr Lamonte Sakai in 3 months or sooner if you have any problems.

## 2014-08-23 NOTE — Assessment & Plan Note (Signed)
Without apparent acute bronchitis exacerbated by an allergy flare. He continues to smoke and I suspect that his recurrent symptoms her not going to abate until he stops.   Please take levofloxacin 500 mg daily for the next 5 days Continue your Breo and Spiriva Use albuterol as needed for shortness of breath or wheezing Stopped her fluticasone nose spray for now. We will start Dymista 1 spray each nostril twice a day Start doing nasal saline washes daily You need to stop smoking completely. Your symptoms are not going to change until we are able to accomplish this.  Follow with Dr Lamonte Sakai in 3 months or sooner if you have any problems.

## 2014-08-23 NOTE — Patient Instructions (Signed)
Please take levofloxacin 500 mg daily for the next 5 days Continue your Breo and Spiriva Use albuterol as needed for shortness of breath or wheezing Stopped her fluticasone nose spray for now. We will start Dymista 1 spray each nostril twice a day Start doing nasal saline washes daily You need to stop smoking completely. Your symptoms are not going to change until we are able to accomplish this.  Follow with Dr Lamonte Sakai in 3 months or sooner if you have any problems.

## 2014-08-31 ENCOUNTER — Telehealth: Payer: Self-pay | Admitting: Emergency Medicine

## 2014-08-31 MED ORDER — AZELASTINE-FLUTICASONE 137-50 MCG/ACT NA SUSP: NASAL | Status: DC

## 2014-08-31 NOTE — Telephone Encounter (Signed)
Rx sent. Pt is aware. Paris Bing, CMA

## 2014-09-17 ENCOUNTER — Other Ambulatory Visit: Payer: Self-pay | Admitting: *Deleted

## 2014-09-17 MED ORDER — TRIAMCINOLONE ACETONIDE 55 MCG/ACT NA AERO: 2.0000 | INHALATION_SPRAY | Freq: Every day | NASAL | Status: DC

## 2014-09-28 ENCOUNTER — Telehealth: Payer: Self-pay | Admitting: Emergency Medicine

## 2014-09-28 NOTE — Telephone Encounter (Signed)
Spoke with pt, let him know that spiriva samples are up front for him.  Nothing further needed.

## 2014-10-09 ENCOUNTER — Other Ambulatory Visit: Payer: Self-pay | Admitting: Emergency Medicine

## 2014-10-09 ENCOUNTER — Telehealth: Payer: Self-pay | Admitting: Emergency Medicine

## 2014-10-09 MED ORDER — ALBUTEROL SULFATE HFA 108 (90 BASE) MCG/ACT IN AERS: 2.0000 | INHALATION_SPRAY | Freq: Four times a day (QID) | RESPIRATORY_TRACT | Status: DC | PRN

## 2014-10-09 NOTE — Telephone Encounter (Signed)
Called and spoke to pt. Pt requesting ventolin refill. Rx sent to preferred pharmacy. Pt also questioned how long the spiriva respimat lasted. I advised pt that he should be taking 2 puffs once daily and it should last about 2 weeks. Pt verbalized understanding and denied any further questions or concerns at this time. Pt denied needing script for spiriva.

## 2014-10-16 ENCOUNTER — Other Ambulatory Visit: Payer: Self-pay | Admitting: Emergency Medicine

## 2014-11-22 ENCOUNTER — Ambulatory Visit (INDEPENDENT_AMBULATORY_CARE_PROVIDER_SITE_OTHER): Payer: BC Managed Care – PPO | Admitting: Adult Health

## 2014-11-22 ENCOUNTER — Encounter: Payer: Self-pay | Admitting: Adult Health

## 2014-11-22 VITALS — BP 128/76 | HR 79 | Temp 97.8°F | Ht 70.5 in | Wt 221.8 lb

## 2014-11-22 DIAGNOSIS — J441 Chronic obstructive pulmonary disease with (acute) exacerbation: Secondary | ICD-10-CM

## 2014-11-22 MED ORDER — DOXYCYCLINE HYCLATE 100 MG PO TABS: 100.0000 mg | ORAL_TABLET | Freq: Two times a day (BID) | ORAL | Status: DC

## 2014-11-22 MED ORDER — TIOTROPIUM BROMIDE MONOHYDRATE 2.5 MCG/ACT IN AERS: 2.0000 | INHALATION_SPRAY | Freq: Every day | RESPIRATORY_TRACT | Status: DC

## 2014-11-22 MED ORDER — PREDNISONE 10 MG PO TABS: ORAL_TABLET | ORAL | Status: DC

## 2014-11-22 NOTE — Assessment & Plan Note (Signed)
Recurrent flare with ongoing smoking   Plan  Doxycycline 100 mg twice daily for 7 days, take with food, use caution in the sunlight as more prone to sunburns Mucinex DM twice daily as needed for cough and congestion.  fluids and rest Prednisone taper over the next week. Continue to work on not smoking. Follow with [name] is planned and as needed Please contact office for sooner follow up if symptoms do not improve or worsen or seek emergency care

## 2014-11-22 NOTE — Patient Instructions (Signed)
Doxycycline 100 mg twice daily for 7 days, take with food, use caution in the sunlight as more prone to sunburns Mucinex DM twice daily as needed for cough and congestion.  fluids and rest Prednisone taper over the next week. Continue to work on not smoking. Follow with [name] is planned and as needed Please contact office for sooner follow up if symptoms do not improve or worsen or seek emergency care

## 2014-11-22 NOTE — Progress Notes (Signed)
  Subjective:    Patient ID: Robert Church, male    DOB: 12/03/1956, 58 y.o.   MRN: [id number]  HPI Synopsis: Robert Church is a 58 yo male with history of COPD, GERD, HTN, and Sinusitis. Has history of heavy tobacco use and longstanding exertional SOB that has worsened over the last 2 years. Had an acute visit on 7/17 and was restarted on breo and spiriva.   ROV>>> 07/20/13 CC: Follow up for office visit. No complaints.  Taking the spiriva once a day and breo once a day and feels great on the combination. Using the albuterol very rarely. Smoking very little, not more than a pack a week. Chest Xray from 7/17 was negative for pneumonia or mass. States he has no breathing complaints. No fever, chest tightness, cough, nasal congestion or wheezing.   ROV 08/15/13 -- COPD, cough, rhinitis, continued tobacco - . He is doing well on Breo + Spiriva. He continues to do yard work, work on cars, but he can't do long walks.  He takes ventolin a few times a week.   ROV 01/26/14 -- COPD, cough, rhinitis, continued tobacco. Was seen by TP in 1/'15 for an AE / bronchitis with levaquin. He has been maintained on Breo and Spiriva. Uses albuterol. He took the levaquin, may have cleared him some. 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 Smokes 1/2 pk a day. He doesn't want nasal steroid.   ROV 05/09/14 -- COPD, cough, rhinitis, continued tobacco. Follows after seeing [facility] in April for an Acute Flare.  He has been having progressive dyspnea, seems to be bothered by the heat and humidity. He been using fluticasone qd.  He is using breo + spiriva, using albuterol bid. He has morning cough. Still smoking.   ROV 08/23/13 -- COPD, cough, rhinitis, continued tobacco use. He was seen by [name] since last time and treated for another acute flare. He has intermittent chest tightness. He has been having nasal congestion, esp at night, has been waking up at night to cough mucous. He gets SOB with any exertion.     11/22/2014 Acute OV  Complains of c/o tired, hacking cough-dry, in am has feeling needing to get something up and it is brown for 2 weeks . Patient denies any chest pain, orthopnea, PND, leg swelling. Has been using over-the-counter cough medicines without much relief. No recent travel. Last antibiotic was 3 months ago. Last cxr 05/2014 w/ nad.     Review of Systems As per HPI     Objective:   Physical Exam  Gen: Pleasant, well-nourished, in no distress,  normal affect  ENT: No lesions,  mouth clear, clear nasal draingae   Neck: No JVD, no TMG, no carotid bruits  Lungs: No use of accessory muscles, distant, soft exp wheeze  Cardiovascular: RRR, heart sounds normal, no murmur or gallops, no peripheral edema  Musculoskeletal: No deformities, no cyanosis or clubbing  Neuro: alert, non focal  Skin: Warm, no lesions or rashes      Assessment & Plan:

## 2014-11-22 NOTE — Addendum Note (Signed)
Addended by: Parke Poisson E on: 11/22/2014 05:34 PM   Modules accepted: Orders, Medications

## 2014-12-04 ENCOUNTER — Other Ambulatory Visit: Payer: Self-pay | Admitting: Emergency Medicine

## 2014-12-04 ENCOUNTER — Telehealth: Payer: Self-pay | Admitting: Emergency Medicine

## 2014-12-04 MED ORDER — FLUTICASONE FUROATE-VILANTEROL 100-25 MCG/INH IN AEPB: 1.0000 | INHALATION_SPRAY | Freq: Every day | RESPIRATORY_TRACT | Status: DC

## 2014-12-04 NOTE — Telephone Encounter (Signed)
Rx has been sent. Pt is aware.  

## 2014-12-19 ENCOUNTER — Telehealth: Payer: Self-pay | Admitting: Adult Health

## 2014-12-19 MED ORDER — LEVOFLOXACIN 500 MG PO TABS: 500.0000 mg | ORAL_TABLET | Freq: Every day | ORAL | Status: DC

## 2014-12-19 MED ORDER — PREDNISONE 10 MG PO TABS: ORAL_TABLET | ORAL | Status: DC

## 2014-12-19 NOTE — Telephone Encounter (Signed)
Called and spoke to pt. Pt stated since he saw TP on 11/22/14 and was given doxy--his s/s have only worsened. Pt stated the doxy did not help at all. Pt c/o PND, dry cough through out day but prod cough in morning with yellow and brown mucus. Pt denies change in SOB from baseline, CP/tightness and f/c/s.   [name] please advise.   Allergies  Allergen Reactions  . E-Mycin [Erythromycin Base] Swelling  . Penicillins Other (See Comments)    Pt took as a child and dr told him if he took again it would kill him.

## 2014-12-19 NOTE — Telephone Encounter (Signed)
Rx sent to pts pharmacy.  Pt notified via voicemail.  Nothing further needed.

## 2014-12-19 NOTE — Telephone Encounter (Signed)
Ok to call in levaquin and give a pred taper > Take 40mg  daily for 3 days, then 30mg  daily for 3 days, then 20mg  daily for 3 days, then 10mg  daily for 3 days, then stop

## 2015-01-15 ENCOUNTER — Ambulatory Visit (INDEPENDENT_AMBULATORY_CARE_PROVIDER_SITE_OTHER): Payer: BLUE CROSS/BLUE SHIELD | Admitting: Internal Medicine

## 2015-01-15 ENCOUNTER — Ambulatory Visit (INDEPENDENT_AMBULATORY_CARE_PROVIDER_SITE_OTHER)
Admission: RE | Admit: 2015-01-15 | Discharge: 2015-01-15 | Disposition: A | Discharge status: Home / Self Care | Payer: BLUE CROSS/BLUE SHIELD | Source: Ambulatory Visit | Attending: Internal Medicine | Admitting: Internal Medicine

## 2015-01-15 ENCOUNTER — Encounter: Payer: Self-pay | Admitting: Internal Medicine

## 2015-01-15 VITALS — BP 140/80 | HR 80 | Temp 98.1°F | Ht 70.0 in | Wt 223.0 lb

## 2015-01-15 DIAGNOSIS — J4531 Mild persistent asthma with (acute) exacerbation: Secondary | ICD-10-CM

## 2015-01-15 DIAGNOSIS — Z720 Tobacco use: Secondary | ICD-10-CM

## 2015-01-15 DIAGNOSIS — J4521 Mild intermittent asthma with (acute) exacerbation: Secondary | ICD-10-CM

## 2015-01-15 DIAGNOSIS — F17210 Nicotine dependence, cigarettes, uncomplicated: Secondary | ICD-10-CM

## 2015-01-15 MED ORDER — TIOTROPIUM BROMIDE MONOHYDRATE 2.5 MCG/ACT IN AERS: 2.0000 | INHALATION_SPRAY | Freq: Every day | RESPIRATORY_TRACT | Status: DC

## 2015-01-15 MED ORDER — PREDNISONE 10 MG PO TABS: ORAL_TABLET | ORAL | Status: DC

## 2015-01-15 MED ORDER — MOMETASONE FURO-FORMOTEROL FUM 200-5 MCG/ACT IN AERO: INHALATION_SPRAY | RESPIRATORY_TRACT | Status: DC

## 2015-01-15 MED ORDER — LEVOFLOXACIN 500 MG PO TABS: 500.0000 mg | ORAL_TABLET | Freq: Every day | ORAL | Status: DC

## 2015-01-15 NOTE — Patient Instructions (Addendum)
Stop breo and start dulera 200  Take 2 puffs first thing in am and then another 2 puffs about 12 hours later.   Levaquin one daily x 7 days   Please remember to go to the  x-ray department downstairs for your tests - we will call you with the results when they are available  If not better, Prednisone 10 mg take  4 each am x 2 days,   2 each am x 2 days,  1 each am x 2 days and stop   The key is to stop smoking completely before smoking completely stops you!   Pepcid ac 20 mg one at bedtime (over the counter) and continue the prilosec Take 30-60 min before first meal of the day until return   GERD (REFLUX)  is an extremely common cause of respiratory symptoms just like yours , many times with no obvious heartburn at all.    It can be treated with medication, but also with lifestyle changes including avoidance of late meals, excessive alcohol, smoking cessation, and avoid fatty foods, chocolate, peppermint, colas, red wine, and acidic juices such as orange juice.  NO MINT OR MENTHOL PRODUCTS SO NO COUGH DROPS  USE SUGARLESS CANDY INSTEAD (Jolley ranchers or Stover's or Life Savers) or even ice chips will also do - the key is to swallow to prevent all throat clearing. NO OIL BASED VITAMINS - use powdered substitutes.    Set up follow up with Dr Lamonte Sakai next available to regroup re your maintenance program and over reliance on albuterol

## 2015-01-15 NOTE — Progress Notes (Signed)
Subjective:    Patient ID: Robert Church, male    DOB: November 21, 1956, 59 y.o.   MRN: [id number]  HPI Synopsis: Robert Church is a 59yo male with history of COPD, GERD, HTN, and Sinusitis. Has history of heavy tobacco use and longstanding exertional SOB  . Had an acute visit on 07/06/13  and was restarted on breo and spiriva.   ROV>>> 07/20/13 CC: Follow up for office visit. No complaints.  Taking the spiriva once a day and breo once a day and feels great on the combination. Using the albuterol very rarely. Smoking very little, not more than a pack a week. Chest Xray from 7/17 was negative for pneumonia or mass. States he has no breathing complaints. No fever, chest tightness, cough, nasal congestion or wheezing.   ROV 08/15/13 -- COPD, cough, rhinitis, continued tobacco - . He is doing well on Breo + Spiriva. He continues to do yard work, work on cars, but he can't do long walks.  He takes ventolin a few times a week.   ROV 01/26/14 -- COPD, cough, rhinitis, continued tobacco. Was seen by TP in 1/'15 for an AE / bronchitis with levaquin. He has been maintained on Breo and Spiriva. Uses albuterol. He took the levaquin, may have cleared him some. 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 Smokes 1/2 pk a day. He doesn't want nasal steroid.   ROV 05/09/14 -- COPD, cough, rhinitis, continued tobacco. Follows after seeing [facility] in April for an Acute Flare.  He has been having progressive dyspnea, seems to be bothered by the heat and humidity. He been using fluticasone qd.  He is using breo + spiriva, using albuterol bid. He has morning cough. Still smoking.   ROV 08/23/13 -- COPD, cough, rhinitis, continued tobacco use. He was seen by [name] since last time and treated for another acute flare. He has intermittent chest tightness. He has been having nasal congestion, esp at night, has been waking up at night to cough mucous. He gets SOB with any exertion.    11/22/2014 Acute OV  Complains of  c/o tired, hacking cough-dry, in am has feeling needing to get something up and it is brown for 2 weeks . Patient denies any chest pain, orthopnea, PND, leg swelling. Has been using over-the-counter cough medicines without much relief. No recent travel. Last antibiotic was 3 months ago. Last cxr 05/2014 w/ nad.  rec Doxycycline 100 mg twice daily for 7 days, take with food, use caution in the sunlight as more prone to sunburns Mucinex DM twice daily as needed for cough and congestion.  fluids and rest Prednisone taper over the next week. Continue to work on not smoking. Follow with [name] is planned and as needed    01/15/2015 f/u ov/Robert Church re: aecopd/ still smoking/ using saba 4 x and neb each am even before onset of acute flare x 5 days Chief Complaint  Patient presents with  . Acute Visit    Pt c/o increased SOB, cough and chest tightness for the past 5 days. His cough is occ prod with minimal yellow sputum.  He is using albuterol inhaler about 4 x per day and uses neb every am.    baseline = freq noct spells of choking/ coughing wheezing and doe but much worse x 5 days abrupt onset/ progressive pattern  Mucus discolored mostly in ams, did not improve with doxy, usually requireds levaquin, wants cxr    No obvious day to  day or daytime variabilty or assoc   cp or   overt sinus or hb symptoms. No unusual exp hx or h/o childhood pna/ asthma or knowledge of premature birth.  Sleeping ok without nocturnal  or early am exacerbation  of respiratory  c/o's or need for noct saba. Also denies any obvious fluctuation of symptoms with weather or environmental changes or other aggravating or alleviating factors except as outlined above   Current Medications, Allergies, Complete Past Medical History, Past Surgical History, Family History, and Social History were reviewed in [employer] record.  ROS  The following are not active complaints unless bolded sore throat, dysphagia,  dental problems, itching, sneezing,  nasal congestion or excess/ purulent secretions, ear ache,   fever, chills, sweats, unintended wt loss, pleuritic or exertional cp, hemoptysis,  orthopnea pnd or leg swelling, presyncope, palpitations, heartburn, abdominal pain, anorexia, nausea, vomiting, diarrhea  or change in bowel or urinary habits, change in stools or urine, dysuria,hematuria,  rash, arthralgias, visual complaints, headache, numbness weakness or ataxia or problems with walking or coordination,  change in mood/affect or memory.             Objective:   Physical Exam  Gen: well-nourished, in no distress,   Wt Readings from Last 3 Encounters:  01/15/15 223 lb (101.152 kg)  11/22/14 221 lb 12.8 oz (100.608 kg)  08/23/14 213 lb 6.4 oz (96.798 kg)    Vital signs reviewed   ENT: No lesions,  mouth clear, clear nasal draingae   Neck: No JVD, no TMG, no carotid bruits  Lungs: No use of accessory muscles, bilateral prominent mid exp wheeze  Cardiovascular: RRR, heart sounds normal, no murmur or gallops, no peripheral edema  Abd mod obese   Musculoskeletal: No deformities, no cyanosis or clubbing  Neuro: alert, non focal  Skin: Warm, no lesions or rashes    CXR PA and Lateral:   01/15/2015 :     I personally reviewed images and agree with radiology impression as follows:     COPD without acute abnormality. Assessment & Plan:

## 2015-01-16 DIAGNOSIS — F17210 Nicotine dependence, cigarettes, uncomplicated: Secondary | ICD-10-CM | POA: Insufficient documentation

## 2015-01-16 NOTE — Assessment & Plan Note (Addendum)
pft's normalize p saba 07/2011 - hfa 75% p coaching 01/15/15   Another flare moderate severity and refractory to saba  DDX of  difficult airways management all start with A and  include Adherence, Ace Inhibitors, Acid Reflux, Active Sinus Disease, Alpha 1 Antitripsin deficiency, Anxiety masquerading as Airways dz,  ABPA,  allergy(esp in young), Aspiration (esp in elderly), Adverse effects of DPI,  Active smokers, plus two Bs  = Bronchiectasis and Beta blocker use..and one C= CHF  In this case Adherence is the biggest issue and starts with  inability to use HFA effectively and also  understand that SABA treats the symptoms but doesn't get to the underlying problem (inflammation).  I used  the analogy of putting steroid cream on a rash to help explain the meaning of topical therapy and the need to get the drug to the target tissue.   - The proper method of use, as well as anticipated side effects, of a metered-dose inhaler are discussed and demonstrated to the patient. Improved effectiveness after extensive coaching during this visit to a level of approximately  75% so could do better with hfa > try dulera 200 2bid  Active smoking clearly an obstacle to good control  ? Acid (or non-acid) GERD > always difficult to exclude as up to 75% of pts in some series report no assoc GI/ Heartburn symptoms> rec max (24h)  acid suppression and diet restrictions/ reviewed and instructions given in writing.  ? Active sinus dz > levaquin x 7 days rec  ? Adverse effect of dpi > clearly not responding to BREO > try dulera 200 2bid   Needs f/u with Dr Lamonte Sakai to address over reliance on saba at [place]

## 2015-01-16 NOTE — Progress Notes (Signed)
Quick Note:  Spoke with pt and notified of results per Dr. Wert. Pt verbalized understanding and denied any questions.  ______ 

## 2015-01-16 NOTE — Assessment & Plan Note (Signed)

## 2015-01-27 NOTE — Progress Notes (Signed)
Patient ID: Robert Church, male   DOB: September 22, 1956, 59 y.o.   MRN: [id number]     59 y.o.  smoker referred for chest pain by Shelda Jakes NP [facility] .  Seen in 2012 chest pain.  Myovue low risk but not totally normal  Overall Impression: Abnormal stress nuclear study. 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 However, the LV function is normal and the apex contracts normally which suggests that this is not an infarct. I cannot exclude the possibility of apical thinning to explain the defect since the data are somewhat conflicting. Echo:  EF 50-55%  Mild LAE  Seen again 6/15 Chest pain the last few months  With fatigue and dyspnea.  Not always exertional Radiates to back on occasion.  Not positional not pleuritic  No relief with antacids  Lasts a few minutes.  Was given amlodipine for HTN a year ago and has had LE edema ever since  Wants a different BP med   Unable to walk on treadmill due to bilateral knee pain  Should have TKR but is scared to do this  Cardiac CT ordered but never done  Cozaar not effective for BP  Amlodipine causing worse LE edema    ROS: Denies fever, malais, weight loss, blurry vision, decreased visual acuity, cough, sputum, SOB, hemoptysis, pleuritic pain, palpitaitons, heartburn, abdominal pain, melena, lower extremity edema, claudication, or rash.  All other systems reviewed and negative   General: Affect appropriate Healthy:  appears stated age 59: normal Neck supple with no adenopathy JVP normal no bruits no thyromegaly Lungs clear with no wheezing and good diaphragmatic motion Heart:  S1/S2 no murmur,rub, gallop or click PMI normal Abdomen: benighn, BS positve, no tenderness, no AAA no bruit.  No HSM or HJR Distal pulses intact with no bruits Plus one bilateral LE edema Neuro non-focal Skin warm and dry No muscular weakness  Medications Current Outpatient Prescriptions  Medication Sig Dispense Refill  .  albuterol (PROVENTIL HFA;VENTOLIN HFA) 108 (90 BASE) MCG/ACT inhaler Inhale 2 puffs into the lungs every 6 (six) hours as needed for wheezing. 1 Inhaler 11  . albuterol (PROVENTIL) (2.5 MG/3ML) 0.083% nebulizer solution Take 3 mLs (2.5 mg total) by nebulization every 6 (six) hours as needed for wheezing or shortness of breath. 75 mL 12  . ALPRAZolam (XANAX) 1 MG tablet TAKE 1 TABLET BY MOUTH EVERY NIGHT AT BEDTIME AS NEEDED 30 tablet 0  . amLODipine (NORVASC) 10 MG tablet Take 10 mg by mouth daily.    . Azelastine-Fluticasone 137-50 MCG/ACT SUSP 1 spray each nostril twice daily 23 g 6  . levofloxacin (LEVAQUIN) 500 MG tablet Take 1 tablet (500 mg total) by mouth daily. 7 tablet 0  . mometasone-formoterol (DULERA) 200-5 MCG/ACT AERO Take 2 puffs first thing in am and then another 2 puffs about 12 hours later. 1 Inhaler 11  . Multiple Vitamin (MULTIVITAMIN) tablet Take 1 tablet by mouth daily.    Marland Kitchen omeprazole (PRILOSEC) 20 MG capsule Take 20 mg by mouth daily. ACID REFLUX    . predniSONE (DELTASONE) 10 MG tablet Take  4 each am x 2 days,   2 each am x 2 days,  1 each am x 2 days and stop 14 tablet 0  . Tiotropium Bromide Monohydrate (SPIRIVA RESPIMAT) 2.5 MCG/ACT AERS Inhale 2 puffs into the lungs daily. 1 Inhaler 0   No current facility-administered medications for this visit.    Allergies E-mycin and Penicillins  Family History: Family History  Problem Relation Age of Onset  . Lung cancer Father   . Brain cancer Father     Social History: History   Social History  . Marital Status: Single    Spouse Name: N/A    Number of Children: N/A  . Years of Education: N/A   Occupational History  . Not on file.   Social History Main Topics  . Smoking status: Current Every Day Smoker -- 0.20 packs/day for 40 years    Types: Cigarettes  . Smokeless tobacco: Never Used     Comment: using electronic cig  . Alcohol Use: No  . Drug Use: No  . Sexual Activity: Yes    Birth Control/  Protection: None   Other Topics Concern  . Not on file   Social History Narrative    Electrocardiogram:  5/15  SR RBBB nonspecific ST/T wave changes   Assessment and Plan

## 2015-01-28 ENCOUNTER — Encounter: Payer: Self-pay | Admitting: Cardiovascular Disease

## 2015-01-28 ENCOUNTER — Ambulatory Visit (INDEPENDENT_AMBULATORY_CARE_PROVIDER_SITE_OTHER): Payer: BLUE CROSS/BLUE SHIELD | Admitting: Cardiovascular Disease

## 2015-01-28 VITALS — BP 144/82 | HR 78 | Ht 70.0 in | Wt 221.0 lb

## 2015-01-28 DIAGNOSIS — J410 Simple chronic bronchitis: Secondary | ICD-10-CM

## 2015-01-28 DIAGNOSIS — I10 Essential (primary) hypertension: Secondary | ICD-10-CM

## 2015-01-28 DIAGNOSIS — R072 Precordial pain: Secondary | ICD-10-CM

## 2015-01-28 DIAGNOSIS — Z79899 Other long term (current) drug therapy: Secondary | ICD-10-CM

## 2015-01-28 MED ORDER — VALSARTAN-HYDROCHLOROTHIAZIDE 160-12.5 MG PO TABS: 1.0000 | ORAL_TABLET | Freq: Every day | ORAL | Status: DC

## 2015-01-28 NOTE — Assessment & Plan Note (Signed)
Resolved normal myovue and ECG  Observe

## 2015-01-28 NOTE — Patient Instructions (Signed)
Your physician recommends that you schedule a follow-up appointment in: NEXT  AVAILABLE  WITH  DR [facility]  Your physician has recommended you make the following change in your medication: STOP  AMLODIPINE START VALSARTAN HCT  160/12.5 MG  EVERY DAY   Your physician recommends that you return for lab work in:  [place]

## 2015-01-28 NOTE — Assessment & Plan Note (Signed)
No active wheezing on Deulura now  F/u Dr Lamonte Sakai and Melvyn Novas CXR last year NAD

## 2015-01-28 NOTE — Assessment & Plan Note (Signed)
Swelling worse try Diovan/HCTZ and stop amlodipine  BMET in 2 weeks f/u with me next available

## 2015-02-08 ENCOUNTER — Other Ambulatory Visit: Payer: Self-pay | Admitting: Adult Health

## 2015-02-11 ENCOUNTER — Other Ambulatory Visit: Payer: BLUE CROSS/BLUE SHIELD

## 2015-02-11 NOTE — Telephone Encounter (Signed)
Electronic refill request for Doxycycline.  Last seen by MW for acute visit on 1.26.16 and given Levaquin.  Note to pharmacy regarding the above and that if pt is still sick, he needs to contact the office.  Refill denied.

## 2015-02-22 ENCOUNTER — Ambulatory Visit: Payer: Self-pay | Admitting: Emergency Medicine

## 2015-03-26 ENCOUNTER — Telehealth: Payer: Self-pay | Admitting: Emergency Medicine

## 2015-03-26 NOTE — Telephone Encounter (Signed)
Pt has been made aware that we do not get samples of Ventolin HFA; offered to send Rx to pharmacy but patient stated he has refills at the pharmacy and will get this from pharmacy. Nothing more needed at this time.

## 2015-05-01 ENCOUNTER — Ambulatory Visit: Payer: Self-pay | Admitting: Emergency Medicine

## 2015-08-13 ENCOUNTER — Encounter: Payer: Self-pay | Admitting: Emergency Medicine

## 2015-08-13 ENCOUNTER — Ambulatory Visit (INDEPENDENT_AMBULATORY_CARE_PROVIDER_SITE_OTHER): Payer: BLUE CROSS/BLUE SHIELD | Admitting: Emergency Medicine

## 2015-08-13 VITALS — BP 166/98 | HR 87 | Ht 70.0 in | Wt 217.0 lb

## 2015-08-13 DIAGNOSIS — Z720 Tobacco use: Secondary | ICD-10-CM

## 2015-08-13 DIAGNOSIS — F17200 Nicotine dependence, unspecified, uncomplicated: Secondary | ICD-10-CM

## 2015-08-13 DIAGNOSIS — J410 Simple chronic bronchitis: Secondary | ICD-10-CM | POA: Diagnosis not present

## 2015-08-13 DIAGNOSIS — G4730 Sleep apnea, unspecified: Secondary | ICD-10-CM

## 2015-08-13 DIAGNOSIS — K219 Gastro-esophageal reflux disease without esophagitis: Secondary | ICD-10-CM | POA: Diagnosis not present

## 2015-08-13 NOTE — Progress Notes (Signed)
Subjective:    Patient ID: Robert Church, male    DOB: 1956/01/24, 59 y.o.   MRN: [id number]  HPI Synopsis: Hutton Pellicane is a 59yo male with history of COPD, GERD, HTN, and Sinusitis. Has history of heavy tobacco use and longstanding exertional SOB  . Had an acute visit on 07/06/13  and was restarted on breo and spiriva.   ROV>>> 07/20/13 CC: Follow up for office visit. No complaints.  Taking the spiriva once a day and breo once a day and feels great on the combination. Using the albuterol very rarely. Smoking very little, not more than a pack a week. Chest Xray from 7/17 was negative for pneumonia or mass. States he has no breathing complaints. No fever, chest tightness, cough, nasal congestion or wheezing.   ROV 08/15/13 -- COPD, cough, rhinitis, continued tobacco - . He is doing well on Breo + Spiriva. He continues to do yard work, work on cars, but he can't do long walks.  He takes ventolin a few times a week.   ROV 01/26/14 -- COPD, cough, rhinitis, continued tobacco. Was seen by TP in 1/'15 for an AE / bronchitis with levaquin. He has been maintained on Breo and Spiriva. Uses albuterol. He took the levaquin, may have cleared him some. 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 Smokes 1/2 pk a day. He doesn't want nasal steroid.   ROV 05/09/14 -- COPD, cough, rhinitis, continued tobacco. Follows after seeing [name] in April for an Acute Flare.  He has been having progressive dyspnea, seems to be bothered by the heat and humidity. He been using fluticasone qd.  He is using breo + spiriva, using albuterol bid. He has morning cough. Still smoking.   ROV 08/23/14 -- COPD, cough, rhinitis, continued tobacco use. He was seen by [name] since last time and treated for another acute flare. He has intermittent chest tightness. He has been having nasal congestion, esp at night, has been waking up at night to cough mucous. He gets SOB with any exertion.   11/22/2014 Acute OV  Complains of c/o  tired, hacking cough-dry, in am has feeling needing to get something up and it is brown for 2 weeks . Patient denies any chest pain, orthopnea, PND, leg swelling. Has been using over-the-counter cough medicines without much relief. No recent travel. Last antibiotic was 3 months ago. Last cxr 05/2014 w/ nad.  rec Doxycycline 100 mg twice daily for 7 days, take with food, use caution in the sunlight as more prone to sunburns Mucinex DM twice daily as needed for cough and congestion.  fluids and rest Prednisone taper over the next week. Continue to work on not smoking. Follow with [name] is planned and as needed   01/15/15 -- f/u ov/Wert re: aecopd/ still smoking/ using saba 4 x and neb each am even before onset of acute flare x 5 days Chief Complaint  Patient presents with  . Follow-up    Pt states his breathing has worsened since last OV with RB. Pt c/o increase in DOE, cough with mucus production in morning - yellow in color, chest tightness when SOB. Pt saw MW in 12/2014 for an acute visit and was started on Dulera. Pt states he has not noticed a difference in breathing since starting medication.    baseline = freq noct spells of choking/ coughing wheezing and doe but much worse x 5 days abrupt onset/ progressive pattern  Mucus discolored mostly in ams,  did not improve with doxy, usually requireds levaquin, wants cxr     ROV 08/13/15 -- follow-up visit for continued tobacco abuse, COPD, allergic rhinitis an chronic cough. He was last seen by me almost a year ago. He has been seen by T Parrett and [name] in the interim for acute exacerbations. He is been followed by [name] with cardiology for chest discomfort and HTN 01/28/15. He states that his breathing has been "terrible". He is having nocturnal awakenings, snoring. His exertional tolerance is worse. He coughs up yellow thick mucous every morning. He is on dulera, spiriva. He uses albuterol neb every morning, sometimes 2x a day. He has cut  cigarettes down to 2 a day.  He has chest tightness and some burning. His nebs do not make it better. Fumes make his breathing worse.         Objective:   Physical Exam Filed Vitals:   08/13/15 1044  BP: 166/98  Pulse: 87  Height: 5\' 10"  (1.778 m)  Weight: 217 lb (98.431 kg)  SpO2: 97%    Gen: well-nourished, in no distress,   ENT: No lesions,  mouth clear, clear nasal draingae   Neck: No JVD, no TMG, no carotid bruits, he does have significant upper airway noise and external stridor  Lungs: No use of accessory muscles, no lower airways wheezing  Cardiovascular: RRR, heart sounds normal, no murmur or gallops, no peripheral edema  Musculoskeletal: No deformities, no cyanosis or clubbing  Neuro: alert, non focal  Skin: Warm, no lesions or rashes   Assessment & Plan:   COPD (chronic obstructive pulmonary disease) Significant COPD with exertional dyspnea and thick secretions. He knows that he needs to stop smoking and he is working on this. I will continue his same bronchodilators for now. We will add Mucinex for his secretions. Currently his noise, discomfort, appeared to be upper airway in nature. He does have GERD and I wonder if this is well controlled. We will increase his omeprazole as below  Smoking Discussed cessation and setting a quit date today. He will consider this  GERD (gastroesophageal reflux disease) He has an upper airway cough and some of his symptoms do sound like persistent reflux. I have asked him to increase his omeprazole to 40 mg twice a day for the next 2 weeks and then go back to 20 mg twice a day. He will assess to see if his symptoms improve  Sleep apnea Clinically he likely has obstructive sleep apnea. We will order a polysomnogram. He wants to see how much this is going to cost him out of pocket before proceeding.

## 2015-08-13 NOTE — Assessment & Plan Note (Signed)
Clinically he likely has obstructive sleep apnea. We will order a polysomnogram. He wants to see how much this is going to cost him out of pocket before proceeding.

## 2015-08-13 NOTE — Assessment & Plan Note (Signed)
Discussed cessation and setting a quit date today. He will consider this

## 2015-08-13 NOTE — Assessment & Plan Note (Signed)
Significant COPD with exertional dyspnea and thick secretions. He knows that he needs to stop smoking and he is working on this. I will continue his same bronchodilators for now. We will add Mucinex for his secretions. Currently his noise, discomfort, appeared to be upper airway in nature. He does have GERD and I wonder if this is well controlled. We will increase his omeprazole as below

## 2015-08-13 NOTE — Assessment & Plan Note (Signed)
He has an upper airway cough and some of his symptoms do sound like persistent reflux. I have asked him to increase his omeprazole to 40 mg twice a day for the next 2 weeks and then go back to 20 mg twice a day. He will assess to see if his symptoms improve

## 2015-08-13 NOTE — Patient Instructions (Addendum)
Please continue Spiriva and Dulera Use albuterol nebulizers as needed Please increase omeprazole to 40 mg twice a day for the next 2 weeks. Then go back to 20 mg twice a day Please start guaifenesin (Mucinex) 600 mg daily.  We will order a sleep study. Depending on the insurance coverage and cost we will proceed.  Congratulations on cutting down your cigarettes. The next step will be for [place] to set a formal quit date when he will go to zero. Start thinking about a plan for this.  Follow with Dr Lamonte Sakai in 2 months or sooner if you have any problems.

## 2015-09-24 ENCOUNTER — Encounter: Payer: Self-pay | Admitting: Emergency Medicine

## 2015-10-15 ENCOUNTER — Ambulatory Visit: Payer: Self-pay | Admitting: Emergency Medicine

## 2015-10-27 ENCOUNTER — Ambulatory Visit (HOSPITAL_BASED_OUTPATIENT_CLINIC_OR_DEPARTMENT_OTHER): Payer: BLUE CROSS/BLUE SHIELD | Attending: Emergency Medicine

## 2015-11-21 ENCOUNTER — Telehealth: Payer: Self-pay | Admitting: Emergency Medicine

## 2015-11-21 NOTE — Telephone Encounter (Signed)
Called spoke with pt. appt scheduled to see MW tomorrow. Offered appt for today but not able to come in. Nothing further needed

## 2015-11-22 ENCOUNTER — Ambulatory Visit (INDEPENDENT_AMBULATORY_CARE_PROVIDER_SITE_OTHER)
Admission: RE | Admit: 2015-11-22 | Discharge: 2015-11-22 | Disposition: A | Discharge status: Home / Self Care | Payer: BLUE CROSS/BLUE SHIELD | Source: Ambulatory Visit | Attending: Internal Medicine | Admitting: Internal Medicine

## 2015-11-22 ENCOUNTER — Encounter: Payer: Self-pay | Admitting: Internal Medicine

## 2015-11-22 ENCOUNTER — Ambulatory Visit (INDEPENDENT_AMBULATORY_CARE_PROVIDER_SITE_OTHER): Payer: BLUE CROSS/BLUE SHIELD | Admitting: Internal Medicine

## 2015-11-22 VITALS — BP 140/90 | HR 87 | Ht 70.5 in | Wt 217.2 lb

## 2015-11-22 DIAGNOSIS — J4521 Mild intermittent asthma with (acute) exacerbation: Secondary | ICD-10-CM | POA: Diagnosis not present

## 2015-11-22 DIAGNOSIS — Z720 Tobacco use: Secondary | ICD-10-CM

## 2015-11-22 DIAGNOSIS — J449 Chronic obstructive pulmonary disease, unspecified: Secondary | ICD-10-CM

## 2015-11-22 DIAGNOSIS — F17210 Nicotine dependence, cigarettes, uncomplicated: Secondary | ICD-10-CM

## 2015-11-22 MED ORDER — PREDNISONE 10 MG PO TABS: ORAL_TABLET | ORAL | Status: DC

## 2015-11-22 NOTE — Patient Instructions (Signed)
dulera 200 first thing in am and 12 h later spriva 2 pff after am dulera   Only use your albuterol (proair/ventolin)  as a rescue medication to be used if you can't catch your breath by resting or doing a relaxed purse lip breathing pattern.  - The less you use it, the better it will work when you need it. - Ok to use up to 2 puffs  every 4 hours if you must but call for immediate appointment if use goes up over your usual need - Don't leave home without it !!  (think of it like the spare tire for your car)   Only use the nebulizer albuterol up to every 4 hours if needed if you proair first and it doesn't work  Prednisone 10 mg take  4 each am x 2 days,   2 each am x 2 days,  1 each am x 2 days and stop   Prilosec x 2 x 30 min before first meal and pepcid ac 20 mg at bedtime  For drainage / throat tickle try take CHLORPHENIRAMINE  4 mg - take one every 4 hours as needed - available over the counter- may cause drowsiness so start with just a bedtime dose or two and see how you tolerate it before trying in daytime    Please remember to go to the x-ray department downstairs for your tests - we will call you with the results when they are available.  GERD (REFLUX)  is an extremely common cause of respiratory symptoms just like yours , many times with no obvious heartburn at all.    It can be treated with medication, but also with lifestyle changes including elevation of the head of your bed (ideally with 6 inch  bed blocks),  Smoking cessation, avoidance of late meals, excessive alcohol, and avoid fatty foods, chocolate, peppermint, colas, red wine, and acidic juices such as orange juice.  NO MINT OR MENTHOL PRODUCTS SO NO COUGH DROPS  USE SUGARLESS CANDY INSTEAD (Jolley ranchers or Stover's or Life Savers) or even ice chips will also do - the key is to swallow to prevent all throat clearing. NO OIL BASED VITAMINS - use powdered substitutes.    Please schedule a follow up office visit in 4  weeks, sooner if needed to see [place]

## 2015-11-22 NOTE — Progress Notes (Signed)
Subjective:    Patient ID: Robert Church, male    DOB: 1956/01/24, 59 y.o.   MRN: [id number]  HPI Synopsis: Robert Church is a 59yo male with history of COPD, GERD, HTN, and Sinusitis. Has history of heavy tobacco use and longstanding exertional SOB  . Had an acute visit on 07/06/13  and was restarted on breo and spiriva.   ROV>>> 07/20/13 CC: Follow up for office visit. No complaints.  Taking the spiriva once a day and breo once a day and feels great on the combination. Using the albuterol very rarely. Smoking very little, not more than a pack a week. Chest Xray from 7/17 was negative for pneumonia or mass. States he has no breathing complaints. No fever, chest tightness, cough, nasal congestion or wheezing.   ROV 08/15/13 -- COPD, cough, rhinitis, continued tobacco - . He is doing well on Breo + Spiriva. He continues to do yard work, work on cars, but he can't do long walks.  He takes ventolin a few times a week.   ROV 01/26/14 -- COPD, cough, rhinitis, continued tobacco. Was seen by Robert Church in 1/'15 for an AE / bronchitis with levaquin. He has been maintained on Breo and Spiriva. Uses albuterol. He took the levaquin, may have cleared him some. 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 Smokes 1/2 pk a day. He doesn'Robert want nasal steroid.   ROV 05/09/14 -- COPD, cough, rhinitis, continued tobacco. Follows after seeing Robert Church in April for an Acute Flare.  He has been having progressive dyspnea, seems to be bothered by the heat and humidity. He been using fluticasone qd.  He is using breo + spiriva, using albuterol bid. He has morning cough. Still smoking.   ROV 08/23/14 -- COPD, cough, rhinitis, continued tobacco use. He was seen by Robert Church since last time and treated for another acute flare. He has intermittent chest tightness. He has been having nasal congestion, esp at night, has been waking up at night to cough mucous. He gets SOB with any exertion.   11/22/2014 Acute OV  Complains of c/o  tired, hacking cough-dry, in am has feeling needing to get something up and it is brown for 2 weeks . Patient denies any chest pain, orthopnea, PND, leg swelling. Has been using over-the-counter cough medicines without much relief. No recent travel. Last antibiotic was 3 months ago. Last cxr 05/2014 w/ nad.  rec Doxycycline 100 mg twice daily for 7 days, take with food, use caution in the sunlight as more prone to sunburns Mucinex DM twice daily as needed for cough and congestion.  fluids and rest Prednisone taper over the next week. Continue to work on not smoking. Follow with Robert. Lamonte Church is planned and as needed   01/15/15 -- f/u ov/Robert Church re: aecopd/ still smoking/ using saba 4 x and neb each am even before onset of acute flare x 5 days Chief Complaint  Patient presents with  . Follow-up    Pt states his breathing has worsened since last OV with RB. Pt c/o increase in DOE, cough with mucus production in morning - yellow in color, chest tightness when SOB. Pt saw Robert Church in 12/2014 for an acute visit and was started on Dulera. Pt states he has not noticed a difference in breathing since starting medication.    baseline = freq noct spells of choking/ coughing wheezing and doe but much worse x 5 days abrupt onset/ progressive pattern  Mucus discolored mostly in ams,  did not improve with doxy, usually requireds levaquin, wants cxr  rec Stop breo and start dulera 200  Take 2 puffs first thing in am and then another 2 puffs about 12 hours later.  Levaquin one daily x 7 days  Please remember to go to the  x-ray department downstairs for your tests - we will call you with the results when they are available If not better, Prednisone 10 mg take  4 each am x 2 days,   2 each am x 2 days,  1 each am x 2 days and stop  The key is to stop smoking completely before smoking completely stops you!  Pepcid ac 20 mg one at bedtime (over the counter) and continue the prilosec Take 30-60 min before first meal of the  day until return  GERD diet     ROV 08/13/15 -- follow-up visit for continued tobacco abuse, COPD, allergic rhinitis an chronic cough. He was last seen by me almost a year ago. He has been seen by Robert Church and Robert Church in the interim for acute exacerbations. He is been followed by Robert Church with cardiology for chest discomfort and HTN 01/28/15. He states that his breathing has been "terrible". He is having nocturnal awakenings, snoring. His exertional tolerance is worse. He coughs up yellow thick mucous every morning. He is on dulera, spiriva. He uses albuterol neb every morning, sometimes 2x a day. He has cut cigarettes down to 2 a day.  He has chest tightness and some burning. His nebs do not make it better. Fumes make his breathing worse.  rec Please continue Spiriva and Dulera Use albuterol nebulizers as needed Please increase omeprazole to 40 mg twice a day for the next 2 weeks. Then go back to 20 mg twice a day Please start guaifenesin (Mucinex) 600 mg daily.   11/24/2015 acute extended ov/Robert Church re: sob/ab vs uacs  Chief Complaint  Patient presents with  . Acute Visit    pt here for an acute visit. pt states since the weathers been changing hes had some chest tightness, SOB, wheezing  and  coughing. pt states it has been a little worse for the past couple of day. pt using inhailersand nebulizers with some relief .      Baseline on dulera 200 and spiriva respimat but over using saba in mulitiple forms acutely worse since colder weather with symptoms worse day than noct  No obvious day to day or daytime variability or assoc excess mucus  or cp or chest tightness, subjective wheeze or overt sinus or hb symptoms. No unusual exp hx or h/o childhood pna/ asthma or knowledge of premature birth.  Sleeping ok without nocturnal  or early am exacerbation  of respiratory  c/o's or need for noct saba. Also denies any obvious fluctuation of symptoms with weather or environmental changes or other aggravating  or alleviating factors except as outlined above   Current Medications, Allergies, Complete Past Medical History, Past Surgical History, Family History, and Social History were reviewed in [employer] record.  ROS  The following are not active complaints unless bolded sore throat, dysphagia, dental problems, itching, sneezing,  nasal congestion or excess/ purulent secretions, ear ache,   fever, chills, sweats, unintended wt loss, classically pleuritic or exertional cp, hemoptysis,  orthopnea pnd or leg swelling, presyncope, palpitations, abdominal pain, anorexia, nausea, vomiting, diarrhea  or change in bowel or bladder habits, change in stools or urine, dysuria,hematuria,  rash, arthralgias, visual complaints, headache, numbness,  weakness or ataxia or problems with walking or coordination,  change in mood/affect or memory.             Objective:   Physical Exam  Obese amb wm prominent pseudowheeze mostly resolves with plm   Wt Readings from Last 3 Encounters:  11/22/15 217 lb 3.2 oz (98.521 kg)  08/13/15 217 lb (98.431 kg)  01/28/15 221 lb (100.245 kg)    Vital signs reviewed    Gen: well-nourished, in no distress,   ENT: No lesions,  mouth clear, clear nasal draingae   Neck: No JVD, no TMG, no carotid bruits, he does have significant upper airway noise and external stridor  Lungs: No use of accessory muscles, no lower airways wheezing  Cardiovascular: RRR, heart sounds normal, no murmur or gallops, no peripheral edema  Musculoskeletal: No deformities, no cyanosis or clubbing  Neuro: alert, non focal  Skin: Warm, no lesions or rashes   CXR PA and Lateral:   11/22/2015 :    I personally reviewed images and agree with radiology impression as follows:    1. Mildly hyperinflated lungs, in keeping with the provided history of COPD. 2. Lungs appear clear.   Assessment & Plan:

## 2015-11-24 NOTE — Assessment & Plan Note (Addendum)
pft's normalized p saba 07/2011 - 11/22/2015  extensive coaching HFA effectiveness =    75%  DDX of  difficult airways management all start with A and  include Adherence, Ace Inhibitors, Acid Reflux, Active Sinus Disease, Alpha 1 Antitripsin deficiency, Anxiety masquerading as Airways dz,  ABPA,  allergy(esp in young), Aspiration (esp in elderly), Adverse effects of meds,  Active smokers, A bunch of PE's (a small clot burden can't cause this syndrome unless there is already severe underlying pulm or vascular dz with poor reserve) plus two Bs  = Bronchiectasis and Beta blocker use..and one C= CHF  Adherence is always the initial "prime suspect" and is a multilayered concern that requires a "trust but verify" approach in every patient - starting with knowing how to use medications, especially inhalers, correctly, keeping up with refills and understanding the fundamental difference between maintenance and prns vs those medications only taken for a very short course and then stopped and not refilled.  - The proper method of use, as well as anticipated side effects, of a metered-dose inhaler are discussed and demonstrated to the patient. Improved effectiveness after extensive coaching during this visit to a level of approximately 50 % from a baseline of 75%   Active smoking discussed sep  ? Acid (or non-acid) GERD > always difficult to exclude as up to 75% of pts in some series report no assoc GI/ Heartburn symptoms> rec max (24h)  acid suppression and diet restrictions/ reviewed and instructions given in writing.   ? Allergy/ doubt > Prednisone 10 mg take  4 each am x 2 days,   2 each am x 2 days,  1 each am x 2 days and stop    I had an extended discussion with the patient reviewing all relevant studies completed to date and  lasting 15 to 20 minutes of a 25 minute visit    Each maintenance medication was reviewed in detail including most importantly the difference between maintenance and prns and under  what circumstances the prns are to be triggered using an action plan format that is not reflected in the computer generated alphabetically organized AVS.    Please see instructions for details which were reviewed in writing and the patient given a copy highlighting the part that I personally wrote and discussed at today's ov.

## 2015-11-24 NOTE — Assessment & Plan Note (Signed)
>   3 min discussion I reviewed the Fletcher curve with the patient that basically indicates  if you quit smoking when your best day FEV1 is still well preserved (as was the case at last pfts 2012 )  it is highly unlikely you will progress to severe disease and informed the patient there was no medication on the market that has proven to alter the curve/ its downward trajectory  or the likelihood of progression of their disease.  Therefore stopping smoking and maintaining abstinence is the most important aspect of care, not choice of inhalers or for that matter, doctors.

## 2015-11-24 NOTE — Assessment & Plan Note (Addendum)
Needs repeat pfts to sort out as his last  In 2012 normalized p saba c/w asthma/ not copd

## 2015-11-24 NOTE — Assessment & Plan Note (Signed)
Complicated by hbp/osa  Body mass index is 30.71  Lab Results  Component Value Date   TSH 0.639 05/16/2013     Contributing to gerd tendency/ doe/reviewed the need and the process to achieve and maintain neg calorie balance > defer f/u primary care including intermittently monitoring thyroid status

## 2015-11-29 ENCOUNTER — Telehealth: Payer: Self-pay | Admitting: Internal Medicine

## 2015-11-29 MED ORDER — DOXYCYCLINE HYCLATE 100 MG PO TABS: 100.0000 mg | ORAL_TABLET | Freq: Two times a day (BID) | ORAL | Status: DC

## 2015-11-29 NOTE — Telephone Encounter (Signed)
Pt seen by [name] 11/22/15 -- given Pred 10mg  taper and reflux meds Pt states that he is unable to take Pepcid -- made him itch  Pt requesting rec's of another reflux med to try in its place.  Pt also c/o increased mucus production(yellow) with cough, congestion, SOB and wheezing x 3 days. Denies fever.  Requesting abx to prevent PNA. Pt states that he had PNA in the past about 3 years ago and does not want to get it again.  Please advise [name]. Thanks.   Patient Instructions 11/22/15     dulera 200 first thing in am and 12 h later spriva 2 pff after am dulera   Only use your albuterol (proair/ventolin) as a rescue medication to be used if you can't catch your breath by resting or doing a relaxed purse lip breathing pattern.  - The less you use it, the better it will work when you need it. - Ok to use up to 2 puffs every 4 hours if you must but call for immediate appointment if use goes up over your usual need - Don't leave home without it !! (think of it like the spare tire for your car)   Only use the nebulizer albuterol up to every 4 hours if needed if you proair first and it doesn't work  Prednisone 10 mg take 4 each am x 2 days, 2 each am x 2 days, 1 each am x 2 days and stop   Prilosec x 2 x 30 min before first meal and pepcid ac 20 mg at bedtime  For drainage / throat tickle try take CHLORPHENIRAMINE 4 mg - take one every 4 hours as needed - available over the counter- may cause drowsiness so start with just a bedtime dose or two and see how you tolerate it before trying in daytime   Please remember to go to the x-ray department downstairs for your tests - we will call you with the results when they are available.  GERD (REFLUX) is an extremely common cause of respiratory symptoms just like yours , many times with no obvious heartburn at all.   It can be treated with medication, but also with lifestyle changes including elevation of the head of your bed (ideally with  6 inch bed blocks), Smoking cessation, avoidance of late meals, excessive alcohol, and avoid fatty foods, chocolate, peppermint, colas, red wine, and acidic juices such as orange juice.  NO MINT OR MENTHOL PRODUCTS SO NO COUGH DROPS  USE SUGARLESS CANDY INSTEAD (Jolley ranchers or Stover's or Life Savers) or even ice chips will also do - the key is to swallow to prevent all throat clearing. NO OIL BASED VITAMINS - use powdered substitutes.   Please schedule a follow up office visit in 4 weeks, sooner if needed to see [place]

## 2015-11-29 NOTE — Telephone Encounter (Signed)
Pt aware of rec's per MW.  Doxy sent to pharmacy. Nothing further needed.

## 2015-11-29 NOTE — Telephone Encounter (Signed)
Doxy 100 mg bid x 10 days  Change prilosec to Take 30- 60 min before your first and last meals of the day   If not improving needs to be seen next week with all meds in hand including otcs

## 2015-12-20 ENCOUNTER — Encounter: Payer: Self-pay | Admitting: Internal Medicine

## 2015-12-20 ENCOUNTER — Ambulatory Visit (INDEPENDENT_AMBULATORY_CARE_PROVIDER_SITE_OTHER): Payer: BLUE CROSS/BLUE SHIELD | Admitting: Internal Medicine

## 2015-12-20 VITALS — BP 140/84 | HR 85 | Temp 98.1°F | Ht 70.0 in | Wt 220.0 lb

## 2015-12-20 DIAGNOSIS — J45909 Unspecified asthma, uncomplicated: Secondary | ICD-10-CM | POA: Diagnosis not present

## 2015-12-20 DIAGNOSIS — R0600 Dyspnea, unspecified: Secondary | ICD-10-CM | POA: Diagnosis not present

## 2015-12-20 DIAGNOSIS — F17210 Nicotine dependence, cigarettes, uncomplicated: Secondary | ICD-10-CM

## 2015-12-20 DIAGNOSIS — Z720 Tobacco use: Secondary | ICD-10-CM

## 2015-12-20 NOTE — Patient Instructions (Addendum)
Am hacking should be all better p levaquin if not call for sinus ct [phone] and ask Libby   prilosec should be 40 mg Take 30- 60 min before your first and last meals of the day   Stop chlorpheniramine   For cough use mucinex dm up to 1200 mg every 12 hours as needed   The key is to stop smoking completely before smoking completely stops you!   Please schedule a follow up office visit in 4 weeks, sooner if needed with Dr Lamonte Sakai, [facility] or me with all active medications in hand

## 2015-12-20 NOTE — Progress Notes (Signed)
Subjective:    Patient ID: Robert Church, male    DOB: 1956-08-15    MRN: [id number]  HPI Synopsis: Robert Church is a 59yo male with history of COPD, GERD, HTN, and Sinusitis. Has history of heavy tobacco use and longstanding exertional SOB  . Had an acute visit on 07/06/13  and was restarted on breo and spiriva.   ROV>>> 07/20/13 CC: Follow up for office visit. No complaints.  Taking the spiriva once a day and breo once a day and feels great on the combination. Using the albuterol very rarely. Smoking very little, not more than a pack a week. Chest Xray from 7/17 was negative for pneumonia or mass. States he has no breathing complaints. No fever, chest tightness, cough, nasal congestion or wheezing.   ROV 08/15/13 -- COPD, cough, rhinitis, continued tobacco - . He is doing well on Breo + Spiriva. He continues to do yard work, work on cars, but he can't do long walks.  He takes ventolin a few times a week.   ROV 01/26/14 -- COPD, cough, rhinitis, continued tobacco. Was seen by TP in 1/'15 for an AE / bronchitis with levaquin. He has been maintained on Breo and Spiriva. Uses albuterol. He took the levaquin, may have cleared him some. 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 Smokes 1/2 pk a day. He doesn't want nasal steroid.   ROV 05/09/14 -- COPD, cough, rhinitis, continued tobacco. Follows after seeing [facility] in April for an Acute Flare.  He has been having progressive dyspnea, seems to be bothered by the heat and humidity. He been using fluticasone qd.  He is using breo + spiriva, using albuterol bid. He has morning cough. Still smoking.   ROV 08/23/14 -- COPD, cough, rhinitis, continued tobacco use. He was seen by [name] since last time and treated for another acute flare. He has intermittent chest tightness. He has been having nasal congestion, esp at night, has been waking up at night to cough mucous. He gets SOB with any exertion.   11/22/2014 Acute OV  Complains of c/o tired,  hacking cough-dry, in am has feeling needing to get something up and it is brown for 2 weeks . Patient denies any chest pain, orthopnea, PND, leg swelling. Has been using over-the-counter cough medicines without much relief. No recent travel. Last antibiotic was 3 months ago. Last cxr 05/2014 w/ nad.  rec Doxycycline 100 mg twice daily for 7 days, take with food, use caution in the sunlight as more prone to sunburns Mucinex DM twice daily as needed for cough and congestion.  fluids and rest Prednisone taper over the next week. Continue to work on not smoking. Follow with [name] is planned and as needed   01/15/15 -- f/u ov/Shantella Blubaugh re: aecopd/ still smoking/ using saba 4 x and neb each am even before onset of acute flare x 5 days Chief Complaint  Patient presents with  . Follow-up    Pt states his breathing has worsened since last OV with RB. Pt c/o increase in DOE, cough with mucus production in morning - yellow in color, chest tightness when SOB. Pt saw MW in 12/2014 for an acute visit and was started on Dulera. Pt states he has not noticed a difference in breathing since starting medication.    baseline = freq noct spells of choking/ coughing wheezing and doe but much worse x 5 days abrupt onset/ progressive pattern  Mucus discolored mostly in ams, did  not improve with doxy, usually requireds levaquin, wants cxr  rec Stop breo and start dulera 200  Take 2 puffs first thing in am and then another 2 puffs about 12 hours later.  Levaquin one daily x 7 days  Please remember to go to the  x-ray department downstairs for your tests - we will call you with the results when they are available If not better, Prednisone 10 mg take  4 each am x 2 days,   2 each am x 2 days,  1 each am x 2 days and stop  The key is to stop smoking completely before smoking completely stops you!  Pepcid ac 20 mg one at bedtime (over the counter) and continue the prilosec Take 30-60 min before first meal of the day until  return  GERD diet     ROV 08/13/15 -- follow-up visit for continued tobacco abuse, COPD, allergic rhinitis an chronic cough. He was last seen by me almost a year ago. He has been seen by T Parrett and [name] in the interim for acute exacerbations. He is been followed by [name] with cardiology for chest discomfort and HTN 01/28/15. He states that his breathing has been "terrible". He is having nocturnal awakenings, snoring. His exertional tolerance is worse. He coughs up yellow thick mucous every morning. He is on dulera, spiriva. He uses albuterol neb every morning, sometimes 2x a day. He has cut cigarettes down to 2 a day.  He has chest tightness and some burning. His nebs do not make it better. Fumes make his breathing worse.  rec Please continue Spiriva and Dulera Use albuterol nebulizers as needed Please increase omeprazole to 40 mg twice a day for the next 2 weeks. Then go back to 20 mg twice a day Please start guaifenesin (Mucinex) 600 mg daily.   11/24/2015 acute extended ov/Henretter Piekarski re: sob/ab vs uacs  Chief Complaint  Patient presents with  . Acute Visit    pt here for an acute visit. pt states since the weathers been changing hes had some chest tightness, SOB, wheezing  and  coughing. pt states it has been a little worse for the past couple of day. pt using inhailersand nebulizers with some relief .   Baseline on dulera 200 and spiriva respimat but over using saba in mulitiple forms acutely worse since colder weather with symptoms worse day than noct rec dulera 200 first thing in am and 12 h later spriva 2 pff after am dulera  Only use your albuterol (proair/ventolin)    Only use the nebulizer albuterol up to every 4 hours if needed if you proair first and it doesn't work Prednisone 10 mg take  4 each am x 2 days,   2 each am x 2 days,  1 each am x 2 days and stop  Prilosec x 2 x 30 min before first meal and pepcid ac 20 mg at bedtime For drainage / throat tickle try take  CHLORPHENIRAMINE  4 mg - take one every 4 hours as needed - available over the counter- may cause drowsiness so start with just a bedtime dose or two and see how you tolerate it before trying in daytime   Please remember to go to the x-ray department downstairs for your tests - we will call you with the results when they are available. GERD diet    12/20/2015 acute extended ov/Stephanne Greeley re: AB worse since Nov 2016  Chief Complaint  Patient presents with  . Follow-up  Pt states that his breathing has not improved much at all since acute visit 4 wks ago. He is wheezing, fatigued, has chest tightness and cough that is occ prod with brown sputum.  He is using albuterol inhlaer 2 x daily on average and neb once daily.  walking at slt less than nl pace x 300 ft s stop  Sleeping ok but when wake up usually feels  short of breath / clears throat in am lots of yellow mucus daily / assoc with  nasal congestion - clears p 5-10 min nasty mucus not cleared with doxy, started levaquin one day prior to OV  And received steroid shot from primary  Walking in cold is the main trigger for daytime cough /sob  Has not used saba yet at 10 am day of ov but did take the dulera / spiriva respimat  Already as rec  Unable to use pepcid (rash) so taking prilosec at hs     No obvious day to day or daytime variability or assoc   cp or chest tightness, subjective wheeze or overt sinus or hb symptoms. No unusual exp hx or h/o childhood pna/ asthma or knowledge of premature birth.  Sleeping ok without nocturnal   exacerbation  of respiratory  c/o's or need for noct saba. Also denies any obvious fluctuation of symptoms with weather or environmental changes or other aggravating or alleviating factors except as outlined above   Current Medications, Allergies, Complete Past Medical History, Past Surgical History, Family History, and Social History were reviewed in [employer] record.  ROS  The following are  not active complaints unless bolded sore throat, dysphagia, dental problems, itching, sneezing,  nasal congestion or excess/ purulent secretions, ear ache,   fever, chills, sweats, unintended wt loss, classically pleuritic or exertional cp, hemoptysis,  orthopnea pnd or leg swelling, presyncope, palpitations, abdominal pain, anorexia, nausea, vomiting, diarrhea  or change in bowel or bladder habits, change in stools or urine, dysuria,hematuria,  rash, arthralgias, visual complaints, headache, numbness, weakness or ataxia or problems with walking or coordination,  change in mood/affect or memory.             Objective:   Physical Exam  Obese amb wm  Mild  pseudowheeze / hopleless affect  12/20/2015     220   11/22/15 217 lb 3.2 oz (98.521 kg)  08/13/15 217 lb (98.431 kg)  01/28/15 221 lb (100.245 kg)    Vital signs reviewed    Gen: well-nourished, in no distress,   ENT: No lesions,  mouth clear, clear nasal draingae   Neck: No JVD, no TMG, no carotid bruits,  Min end exp wheeze with lots of upper airway noises better with purse lip maneuver   Lungs: No use of accessory muscles, no lower airways wheezing  Cardiovascular: RRR, heart sounds normal, no murmur or gallops, no peripheral edema  Musculoskeletal: No deformities, no cyanosis or clubbing  Neuro: alert, non focal  Skin: Warm, no lesions or rashes   CXR PA and Lateral:   11/22/2015 :    I personally reviewed images and agree with radiology impression as follows:    1. Mildly hyperinflated lungs, in keeping with the provided history of COPD. 2. Lungs appear clear.   Assessment & Plan:

## 2015-12-21 DIAGNOSIS — R0600 Dyspnea, unspecified: Secondary | ICD-10-CM | POA: Insufficient documentation

## 2015-12-21 NOTE — Assessment & Plan Note (Signed)
Discussed the risks and costs (both direct and indirect)  of smoking relative to the benefits of quitting but patient unwilling to commit at this point to a specific quit date.    Offered to help with quitting by use of chantix or referral to our Quit Smart program when the patient is ready.  

## 2015-12-21 NOTE — Assessment & Plan Note (Signed)
12/20/2015  Walked RA x 3 laps @ 185 ft each stopped due to  End of study, fast  pace, no sob or desat    Not able to reproduce this daily complaint indoors but note his spirometry was done shortly after walked into office with outside temps in the 30's and showed not obst at all so doubt sign airways dz limiting him - most likely this is obesity/deconditioning or malingering.

## 2015-12-21 NOTE — Assessment & Plan Note (Addendum)
pft's normalized p saba 07/2011 - 11/22/2015  extensive coaching HFA effectiveness =    75% - 12/20/2015  extensive coaching HFA effectiveness =    90%  - Spirometry 12/20/2015 FEV1  2.60 (69%) ratio 75 p am dulera/respimat and walking in from cold   DDX of  difficult airways management all start with A and  include Adherence, Ace Inhibitors, Acid Reflux, Active Sinus Disease, Alpha 1 Antitripsin deficiency, Anxiety masquerading as Airways dz,  ABPA,  allergy(esp in young), Aspiration (esp in elderly), Adverse effects of meds,  Active smokers, A bunch of PE's (a small clot burden can't cause this syndrome unless there is already severe underlying pulm or vascular dz with poor reserve) plus two Bs  = Bronchiectasis and Beta blocker use..and one C= CHF   Adherence is always the initial "prime suspect" and is a multilayered concern that requires a "trust but verify" approach in every patient - starting with knowing how to use medications, especially inhalers, correctly, keeping up with refills and understanding the fundamental difference between maintenance and prns vs those medications only taken for a very short course and then stopped and not refilled.  - - The proper method of use, as well as anticipated side effects, of a metered-dose inhaler are discussed and demonstrated to the patient. Improved effectiveness after extensive coaching during this visit to a level of approximately 90 % from a baseline of 90 %  > continue dulera and spiriva for now    Active smoking is the biggest concern here > discussed sep  Active/ acute sinus dz > needs to complete the rx with levquin then sinus CT if not better   ? Allergy > no point in pursuing while still smoking/ advised    I had an extended discussion with the patient reviewing all relevant studies completed to date and  lasting 15 to 20 minutes of a 25 minute visit    Each maintenance medication was reviewed in detail including most importantly the  difference between maintenance and prns and under what circumstances the prns are to be triggered using an action plan format that is not reflected in the computer generated alphabetically organized AVS.    Please see instructions for details which were reviewed in writing and the patient given a copy highlighting the part that I personally wrote and discussed at today's ov.

## 2016-01-23 ENCOUNTER — Encounter: Payer: Self-pay | Admitting: Adult Health

## 2016-02-26 ENCOUNTER — Encounter (HOSPITAL_COMMUNITY): Payer: Self-pay | Admitting: Emergency Medicine

## 2016-02-26 ENCOUNTER — Emergency Department (HOSPITAL_COMMUNITY): Payer: BLUE CROSS/BLUE SHIELD

## 2016-02-26 DIAGNOSIS — Z8701 Personal history of pneumonia (recurrent): Secondary | ICD-10-CM | POA: Insufficient documentation

## 2016-02-26 DIAGNOSIS — Z792 Long term (current) use of antibiotics: Secondary | ICD-10-CM | POA: Diagnosis not present

## 2016-02-26 DIAGNOSIS — K219 Gastro-esophageal reflux disease without esophagitis: Secondary | ICD-10-CM | POA: Insufficient documentation

## 2016-02-26 DIAGNOSIS — Z7951 Long term (current) use of inhaled steroids: Secondary | ICD-10-CM | POA: Diagnosis not present

## 2016-02-26 DIAGNOSIS — Z880 Allergy status to penicillin: Secondary | ICD-10-CM | POA: Insufficient documentation

## 2016-02-26 DIAGNOSIS — F17210 Nicotine dependence, cigarettes, uncomplicated: Secondary | ICD-10-CM | POA: Diagnosis not present

## 2016-02-26 DIAGNOSIS — J069 Acute upper respiratory infection, unspecified: Secondary | ICD-10-CM | POA: Diagnosis not present

## 2016-02-26 DIAGNOSIS — R197 Diarrhea, unspecified: Secondary | ICD-10-CM | POA: Insufficient documentation

## 2016-02-26 DIAGNOSIS — Z79899 Other long term (current) drug therapy: Secondary | ICD-10-CM | POA: Diagnosis not present

## 2016-02-26 DIAGNOSIS — J441 Chronic obstructive pulmonary disease with (acute) exacerbation: Secondary | ICD-10-CM | POA: Diagnosis not present

## 2016-02-26 DIAGNOSIS — I10 Essential (primary) hypertension: Secondary | ICD-10-CM | POA: Diagnosis not present

## 2016-02-26 DIAGNOSIS — R05 Cough: Secondary | ICD-10-CM | POA: Diagnosis present

## 2016-02-26 NOTE — ED Notes (Signed)
Pt. reports persistent dry cough with SOB , chest congestion , fever /chills and nasal congestion , received Solumedrol 125 mg IV and Duoneb by EMS prior to arrival .

## 2016-02-27 ENCOUNTER — Emergency Department (HOSPITAL_COMMUNITY)
Admission: EM | Admit: 2016-02-27 | Discharge: 2016-02-27 | Disposition: A | Discharge status: Home / Self Care | Payer: BLUE CROSS/BLUE SHIELD | Attending: Emergency Medicine | Admitting: Emergency Medicine

## 2016-02-27 DIAGNOSIS — J069 Acute upper respiratory infection, unspecified: Secondary | ICD-10-CM

## 2016-02-27 DIAGNOSIS — R509 Fever, unspecified: Secondary | ICD-10-CM

## 2016-02-27 DIAGNOSIS — J441 Chronic obstructive pulmonary disease with (acute) exacerbation: Secondary | ICD-10-CM

## 2016-02-27 DIAGNOSIS — B9789 Other viral agents as the cause of diseases classified elsewhere: Secondary | ICD-10-CM

## 2016-02-27 LAB — COMPREHENSIVE METABOLIC PANEL
ALT: 28 U/L (ref 17–63)
AST: 31 U/L (ref 15–41)
Albumin: 3.7 g/dL (ref 3.5–5.0)
Alkaline Phosphatase: 44 U/L (ref 38–126)
Anion gap: 14 (ref 5–15)
BUN: 12 mg/dL (ref 6–20)
CO2: 25 mmol/L (ref 22–32)
Calcium: 8.6 mg/dL — ABNORMAL LOW (ref 8.9–10.3)
Chloride: 100 mmol/L — ABNORMAL LOW (ref 101–111)
Creatinine, Ser: 0.95 mg/dL (ref 0.61–1.24)
GFR calc Af Amer: >60 mL/min (ref >60)
GFR calc non Af Amer: >60 mL/min (ref >60)
Glucose, Bld: 108 mg/dL — ABNORMAL HIGH (ref 65–99)
Potassium: 3.3 mmol/L — ABNORMAL LOW (ref 3.5–5.1)
Sodium: 139 mmol/L (ref 135–145)
Total Bilirubin: 1.2 mg/dL (ref 0.3–1.2)
Total Protein: 6.2 g/dL — ABNORMAL LOW (ref 6.5–8.1)

## 2016-02-27 LAB — CBC WITH DIFFERENTIAL/PLATELET
Basophils Absolute: 0 10*3/uL (ref 0.0–0.1)
Basophils Relative: 0 %
Eosinophils Absolute: 0 10*3/uL (ref 0.0–0.7)
Eosinophils Relative: 0 %
HCT: 47.9 % (ref 39.0–52.0)
Hemoglobin: 16.9 g/dL (ref 13.0–17.0)
Lymphocytes Relative: 8 %
Lymphs Abs: 0.6 10*3/uL — ABNORMAL LOW (ref 0.7–4.0)
MCH: 34.1 pg — ABNORMAL HIGH (ref 26.0–34.0)
MCHC: 35.3 g/dL (ref 30.0–36.0)
MCV: 96.8 fL (ref 78.0–100.0)
Monocytes Absolute: 0.8 10*3/uL (ref 0.1–1.0)
Monocytes Relative: 9 %
Neutro Abs: 7 10*3/uL (ref 1.7–7.7)
Neutrophils Relative %: 83 %
Platelets: 154 10*3/uL (ref 150–400)
RBC: 4.95 MIL/uL (ref 4.22–5.81)
RDW: 12.5 % (ref 11.5–15.5)
WBC: 8.4 10*3/uL (ref 4.0–10.5)

## 2016-02-27 MED ORDER — IPRATROPIUM-ALBUTEROL 0.5-2.5 (3) MG/3ML IN SOLN
3.0000 mL | Freq: Once | RESPIRATORY_TRACT | Status: AC
  Administered 2016-02-27: 3 mL via RESPIRATORY_TRACT
  Filled 2016-02-27: qty 3

## 2016-02-27 MED ORDER — ONDANSETRON HCL 4 MG/2ML IJ SOLN
4.0000 mg | Freq: Once | INTRAMUSCULAR | Status: AC
  Administered 2016-02-27: 4 mg via INTRAVENOUS
  Filled 2016-02-27: qty 2

## 2016-02-27 MED ORDER — PREDNISONE 20 MG PO TABS: 60.0000 mg | ORAL_TABLET | Freq: Every day | ORAL | Status: DC

## 2016-02-27 MED ORDER — BENZONATATE 100 MG PO CAPS: 100.0000 mg | ORAL_CAPSULE | Freq: Three times a day (TID) | ORAL | Status: DC | PRN

## 2016-02-27 MED ORDER — SODIUM CHLORIDE 0.9 % IV BOLUS (SEPSIS)
1000.0000 mL | Freq: Once | INTRAVENOUS | Status: AC
  Administered 2016-02-27: 1000 mL via INTRAVENOUS

## 2016-02-27 MED ORDER — KETOROLAC TROMETHAMINE 30 MG/ML IJ SOLN
30.0000 mg | Freq: Once | INTRAMUSCULAR | Status: AC
  Administered 2016-02-27: 30 mg via INTRAVENOUS
  Filled 2016-02-27: qty 1

## 2016-02-27 NOTE — Discharge Instructions (Signed)
Please use your nebulizer treatments every 2-4 hours as needed. You may start your steroids tomorrow afternoon. You may use Tylenol 1000 mg every 6 hours as needed for fever and pain and alternate this with ibuprofen 600 mg every 6 hours as needed for fever and pain. Please rest and increase your fluid intake. Your symptoms are likely caused by a viral illness which antibiotics will not be helpful for.   Chronic Obstructive Pulmonary Disease Exacerbation Chronic obstructive pulmonary disease (COPD) is a common lung condition in which airflow from the lungs is limited. COPD is a general term that can be used to describe many different lung problems that limit airflow, including chronic bronchitis and emphysema. COPD exacerbations are episodes when breathing symptoms become much worse and require extra treatment. Without treatment, COPD exacerbations can be life threatening, and frequent COPD exacerbations can cause further damage to your lungs. CAUSES  Respiratory infections.  Exposure to smoke.  Exposure to air pollution, chemical fumes, or dust. Sometimes there is no apparent cause or trigger. RISK FACTORS  Smoking cigarettes.  Older age.  Frequent prior COPD exacerbations. SIGNS AND SYMPTOMS  Increased coughing.  Increased thick spit (sputum) production.  Increased wheezing.  Increased shortness of breath.  Rapid breathing.  Chest tightness. DIAGNOSIS Your medical history, a physical exam, and tests will help your health care provider make a diagnosis. Tests may include:  A chest X-ray.  Basic lab tests.  Sputum testing.  An arterial blood gas test. TREATMENT Depending on the severity of your COPD exacerbation, you may need to be admitted to a hospital for treatment. Some of the treatments commonly used to treat COPD exacerbations are:   Antibiotic medicines.  Bronchodilators. These are drugs that expand the air passages. They may be given with an inhaler or  nebulizer. Spacer devices may be needed to help improve drug delivery.  Corticosteroid medicines.  Supplemental oxygen therapy.  Airway clearing techniques, such as noninvasive ventilation (NIV) and positive expiratory pressure (PEP). These provide respiratory support through a mask or other noninvasive device. HOME CARE INSTRUCTIONS  Do not smoke. Quitting smoking is very important to prevent COPD from getting worse and exacerbations from happening as often.  Avoid exposure to all substances that irritate the airway, especially to tobacco smoke.  If you were prescribed an antibiotic medicine, finish it all even if you start to feel better.  Take all medicines as directed by your health care provider.It is important to use correct technique with inhaled medicines.  Drink enough fluids to keep your urine clear or pale yellow (unless you have a medical condition that requires fluid restriction).  Use a cool mist vaporizer. This makes it easier to clear your chest when you cough.  If you have a home nebulizer and oxygen, continue to use them as directed.  Maintain all necessary vaccinations to prevent infections.  Exercise regularly.  Eat a healthy diet.  Keep all follow-up appointments as directed by your health care provider. SEEK IMMEDIATE MEDICAL CARE IF:  You have worsening shortness of breath.  You have trouble talking.  You have severe chest pain.  You have blood in your sputum.  You have a fever.  You have weakness, vomit repeatedly, or faint.  You feel confused.  You continue to get worse. MAKE SURE YOU:  Understand these instructions.  Will watch your condition.  Will get help right away if you are not doing well or get worse.   This information is not intended to replace advice given  to you by your health care provider. Make sure you discuss any questions you have with your health care provider.   Document Released: 10/04/2007 Document Revised:  12/28/2014 Document Reviewed: 08/11/2013 Elsevier Interactive Patient Education 2016 Else[employer]  Upper Respiratory Infection, Adult Most upper respiratory infections (URIs) are a viral infection of the air passages leading to the lungs. A URI affects the nose, throat, and upper air passages. The most common type of URI is nasopharyngitis and is typically referred to as "the common cold." URIs run their course and usually go away on their own. Most of the time, a URI does not require medical attention, but sometimes a bacterial infection in the upper airways can follow a viral infection. This is called a secondary infection. Sinus and middle ear infections are common types of secondary upper respiratory infections. Bacterial pneumonia can also complicate a URI. A URI can worsen asthma and chronic obstructive pulmonary disease (COPD). Sometimes, these complications can require emergency medical care and may be life threatening.  CAUSES Almost all URIs are caused by viruses. A virus is a type of germ and can spread from one person to another.  RISKS FACTORS You may be at risk for a URI if:   You smoke.   You have chronic heart or lung disease.  You have a weakened defense (immune) system.   You are very young or very old.   You have nasal allergies or asthma.  You work in crowded or poorly ventilated areas.  You work in health care facilities or schools. SIGNS AND SYMPTOMS  Symptoms typically develop 2-3 days after you come in contact with a cold virus. Most viral URIs last 7-10 days. However, viral URIs from the influenza virus (flu virus) can last 14-18 days and are typically more severe. Symptoms may include:   Runny or stuffy (congested) nose.   Sneezing.   Cough.   Sore throat.   Headache.   Fatigue.   Fever.   Loss of appetite.   Pain in your forehead, behind your eyes, and over your cheekbones (sinus pain).  Muscle aches.  DIAGNOSIS  Your health  care provider may diagnose a URI by:  Physical exam.  Tests to check that your symptoms are not due to another condition such as:  Strep throat.  Sinusitis.  Pneumonia.  Asthma. TREATMENT  A URI goes away on its own with time. It cannot be cured with medicines, but medicines may be prescribed or recommended to relieve symptoms. Medicines may help:  Reduce your fever.  Reduce your cough.  Relieve nasal congestion. HOME CARE INSTRUCTIONS   Take medicines only as directed by your health care provider.   Gargle warm saltwater or take cough drops to comfort your throat as directed by your health care provider.  Use a warm mist humidifier or inhale steam from a shower to increase air moisture. This may make it easier to breathe.  Drink enough fluid to keep your urine clear or pale yellow.   Eat soups and other clear broths and maintain good nutrition.   Rest as needed.   Return to work when your temperature has returned to normal or as your health care provider advises. You may need to stay home longer to avoid infecting others. You can also use a face mask and careful hand washing to prevent spread of the virus.  Increase the usage of your inhaler if you have asthma.   Do not use any tobacco products, including cigarettes, chewing tobacco, or  electronic cigarettes. If you need help quitting, ask your health care provider. PREVENTION  The best way to protect yourself from getting a cold is to practice good hygiene.   Avoid oral or hand contact with people with cold symptoms.   Wash your hands often if contact occurs.  There is no clear evidence that vitamin C, vitamin E, echinacea, or exercise reduces the chance of developing a cold. However, it is always recommended to get plenty of rest, exercise, and practice good nutrition.  SEEK MEDICAL CARE IF:   You are getting worse rather than better.   Your symptoms are not controlled by medicine.   You have  chills.  You have worsening shortness of breath.  You have brown or red mucus.  You have yellow or brown nasal discharge.  You have pain in your face, especially when you bend forward.  You have a fever.  You have swollen neck glands.  You have pain while swallowing.  You have white areas in the back of your throat. SEEK IMMEDIATE MEDICAL CARE IF:   You have severe or persistent:  Headache.  Ear pain.  Sinus pain.  Chest pain.  You have chronic lung disease and any of the following:  Wheezing.  Prolonged cough.  Coughing up blood.  A change in your usual mucus.  You have a stiff neck.  You have changes in your:  Vision.  Hearing.  Thinking.  Mood. MAKE SURE YOU:   Understand these instructions.  Will watch your condition.  Will get help right away if you are not doing well or get worse.   This information is not intended to replace advice given to you by your health care provider. Make sure you discuss any questions you have with your health care provider.   Document Released: 06/02/2001 Document Revised: 04/23/2015 Document Reviewed: 03/14/2014 Elsevier Interactive Patient Education [employer].

## 2016-02-27 NOTE — ED Provider Notes (Signed)
TIME SEEN: 5:20 AM  CHIEF COMPLAINT: Fever, cough, nasal congestion, nausea, diarrhea  HPI: Pt is a 60 y.o. male with history of COPD who still smokes but does not wear oxygen at home, hypertension who presents to the emergency department with complaints of fever (102 at home), dry cough, nasal congestion, some nausea has improved and mild diarrhea that started today. He denies vomiting. Denies chest pain. States he felt slightly short of breath is improved after breathing treatment EMS. Was also given Solu-Medrol with EMS. He did not have an influenza vaccination this year. No sick contacts or recent travel.  ROS: See HPI Constitutional:  fever  Eyes: no drainage  ENT: no runny nose   Cardiovascular:  no chest pain  Resp:  SOB  GI: no vomiting GU: no dysuria Integumentary: no rash  Allergy: no hives  Musculoskeletal: no leg swelling  Neurological: no slurred speech ROS otherwise negative  PAST MEDICAL HISTORY/PAST SURGICAL HISTORY:  Past Medical History  Diagnosis Date  . COPD (chronic obstructive pulmonary disease) ([place])   . SOB (shortness of breath)   . Bronchitis   . GERD (gastroesophageal reflux disease)   . Hypertension   . Pneumonia   . Sinusitis   . Allergy   . Asthma     MEDICATIONS:  Prior to Admission medications   Medication Sig Start Date End Date Taking? Authorizing Provider  albuterol (PROVENTIL HFA;VENTOLIN HFA) 108 (90 BASE) MCG/ACT inhaler Inhale 2 puffs into the lungs every 6 (six) hours as needed for wheezing. 10/09/14   Collene Gobble, MD  albuterol (PROVENTIL) (2.5 MG/3ML) 0.083% nebulizer solution Take 3 mLs (2.5 mg total) by nebulization every 6 (six) hours as needed for wheezing or shortness of breath. 05/29/14   Collene Gobble, MD  ALPRAZolam Duanne Moron) 1 MG tablet TAKE 1 TABLET BY MOUTH EVERY NIGHT AT BEDTIME AS NEEDED 03/04/14   Darlyne Russian, MD  Azelastine-Fluticasone [phone] MCG/ACT SUSP 1 spray each nostril twice daily 08/31/14   Collene Gobble, MD   levofloxacin (LEVAQUIN) 500 MG tablet Take 500 mg by mouth daily.    Historical Provider, MD  mometasone-formoterol (DULERA) 200-5 MCG/ACT AERO Take 2 puffs first thing in am and then another 2 puffs about 12 hours later. 01/15/15   Tanda Rockers, MD  omeprazole (PRILOSEC) 20 MG capsule Take 20 mg by mouth daily. ACID REFLUX    Historical Provider, MD  Tiotropium Bromide Monohydrate (SPIRIVA RESPIMAT) 2.5 MCG/ACT AERS Inhale 2 puffs into the lungs daily. 01/15/15   Tanda Rockers, MD  valsartan-hydrochlorothiazide (DIOVAN-HCT) 160-12.5 MG per tablet Take 1 tablet by mouth daily. 01/28/15   Josue Hector, MD    ALLERGIES:  Allergies  Allergen Reactions  . E-Mycin [Erythromycin Base] Swelling  . Penicillins Other (See Comments)    Pt took as a child and dr told him if he took again it would kill him.    SOCIAL HISTORY:  Social History  Substance Use Topics  . Smoking status: Current Every Day Smoker -- 0.20 packs/day for 40 years    Types: Cigarettes  . Smokeless tobacco: Never Used     Comment: using electronic cig  . Alcohol Use: No    FAMILY HISTORY: Family History  Problem Relation Age of Onset  . Lung cancer Father   . Brain cancer Father     EXAM: BP 150/98 mmHg  Pulse 98  Temp(Src) 98.6 F (37 C) (Oral)  Resp 20  SpO2 96% CONSTITUTIONAL: Alert and oriented and  responds appropriately to questions. Well-appearing; well-nourished, Afebrile and nontoxic appearing HEAD: Normocephalic EYES: Conjunctivae clear, PERRL ENT: normal nose; no rhinorrhea; moist mucous membranes; No pharyngeal erythema or petechiae, no tonsillar hypertrophy or exudate, no uvular deviation, no trismus or drooling, normal phonation, no stridor, no dental caries or abscess noted, no Ludwig's angina, tongue sits flat in the bottom of the mouth NECK: Supple, no meningismus, no LAD; no nuchal rigidity CARD: RRR; S1 and S2 appreciated; no murmurs, no clicks, no rubs, no gallops RESP: Normal chest  excursion without splinting or tachypnea; breath sounds clear and equal bilaterally; no wheezes until patient is coughing and then he will have some expiratory wheeze, no rhonchi, no rales, no hypoxia or respiratory distress, speaking full sentences ABD/GI: Normal bowel sounds; non-distended; soft, non-tender, no rebound, no guarding, no peritoneal signs BACK:  The back appears normal and is non-tender to palpation, there is no CVA tenderness EXT: Normal ROM in all joints; non-tender to palpation; no edema; normal capillary refill; no cyanosis, no calf tenderness or swelling    SKIN: Normal color for age and race; warm; no rash NEURO: Moves all extremities equally, sensation to light touch intact diffusely, cranial nerves II through XII intact PSYCH: The patient's mood and manner are appropriate. Grooming and personal hygiene are appropriate.  MEDICAL DECISION MAKING: Patient here with viral upper respiratory infection. Hemodynamically stable. Lungs are clear except he will have some wheezing with coughing. Abdomen soft nontender. No meningismus. He is afebrile and nontoxic appearing. No respiratory distress, increased work of breathing or hypoxia. Will give a duo neb for symptomatic relief. He has already received IV steroids with EMS. We'll give IV fluids, Toradol, Zofran. Discussed with him that this is likely a viral illness. His labs and chest x-ray are unremarkable. Chest x-ray shows bronchitic changes but no infiltrate. I discussed with him that I do not feel antibiotics will be helpful and he agrees. Discussed with him that this could be influenza and have offered him team of fluids he is within the treatment window but he declines. Recommended alternating Tylenol and ibuprofen for fever and pain. Recommended rest, increase fluid intake. Recommended using his albuterol inhaler and nebulizer treatments at home and will discharge with steroid burst. States he has plenty of albuterol at home and does  not need a refill. Discussed return precautions. He verbalized understanding and is comfortable with this plan. States he has a PCP for follow-up.       Bud, DO 02/27/16 [phone]

## 2016-03-03 ENCOUNTER — Telehealth: Payer: Self-pay | Admitting: Internal Medicine

## 2016-03-03 NOTE — Telephone Encounter (Signed)
Spoke with pt, schedule acute ov with PM tomorrow morning.  Nothing further needed.

## 2016-03-04 ENCOUNTER — Ambulatory Visit (INDEPENDENT_AMBULATORY_CARE_PROVIDER_SITE_OTHER): Payer: BLUE CROSS/BLUE SHIELD | Admitting: Pulmonary Disease

## 2016-03-04 ENCOUNTER — Encounter: Payer: Self-pay | Admitting: Pulmonary Disease

## 2016-03-04 VITALS — BP 132/78 | HR 74 | Temp 98.4°F | Ht 70.5 in | Wt 217.2 lb

## 2016-03-04 DIAGNOSIS — R0600 Dyspnea, unspecified: Secondary | ICD-10-CM | POA: Diagnosis not present

## 2016-03-04 DIAGNOSIS — J4531 Mild persistent asthma with (acute) exacerbation: Secondary | ICD-10-CM

## 2016-03-04 DIAGNOSIS — J449 Chronic obstructive pulmonary disease, unspecified: Secondary | ICD-10-CM

## 2016-03-04 NOTE — Patient Instructions (Signed)
Please use a prednisone taper that you already have for the next one week.  We'll arrange a follow-up with Dr. Melvyn Novas within the next month.

## 2016-03-04 NOTE — Progress Notes (Signed)
Subjective:    Patient ID: Robert Church, male    DOB: 23-Oct-1956, 60 y.o.   MRN: [id number]  HPI Acute visit for COPD, asthma exacerbation.  Robert Church is a 60 year old with asthma, COPD. He is a patient of Dr. Melvyn Novas. Has had symptoms of shortness of breath, chest tightness, cough with productive sputum, fevers for the past few weeks. He was seen in the ED last week. He was given a dose of Solu-Medrol but was not discharged on a prednisone taper and antibiotics. He continued to feel unwell. He had some levofloxacin at home that he took for about 4 days. His fevers and his mucus production has improved but he continues to be short of breath with wheezing.  CXR 02/26/16 Bronchial thickening  Past Medical History  Diagnosis Date  . COPD (chronic obstructive pulmonary disease) ([place])   . SOB (shortness of breath)   . Bronchitis   . GERD (gastroesophageal reflux disease)   . Hypertension   . Pneumonia   . Sinusitis   . Allergy   . Asthma      Current outpatient prescriptions:  .  albuterol (PROVENTIL HFA;VENTOLIN HFA) 108 (90 BASE) MCG/ACT inhaler, Inhale 2 puffs into the lungs every 6 (six) hours as needed for wheezing., Disp: 1 Inhaler, Rfl: 11 .  albuterol (PROVENTIL) (2.5 MG/3ML) 0.083% nebulizer solution, Take 3 mLs (2.5 mg total) by nebulization every 6 (six) hours as needed for wheezing or shortness of breath., Disp: 75 mL, Rfl: 12 .  ALPRAZolam (XANAX) 1 MG tablet, TAKE 1 TABLET BY MOUTH EVERY NIGHT AT BEDTIME AS NEEDED, Disp: 30 tablet, Rfl: 0 .  Azelastine-Fluticasone 137-50 MCG/ACT SUSP, 1 spray each nostril twice daily, Disp: 23 g, Rfl: 6 .  levocetirizine (XYZAL) 5 MG tablet, Take 5 mg by mouth at bedtime., Disp: , Rfl: 0 .  mometasone-formoterol (DULERA) 200-5 MCG/ACT AERO, Take 2 puffs first thing in am and then another 2 puffs about 12 hours later., Disp: 1 Inhaler, Rfl: 11 .  omeprazole (PRILOSEC) 20 MG capsule, Take 20 mg by mouth daily. ACID REFLUX, Disp: , Rfl:  .   Tiotropium Bromide Monohydrate (SPIRIVA RESPIMAT) 2.5 MCG/ACT AERS, Inhale 2 puffs into the lungs daily., Disp: 1 Inhaler, Rfl: 0 .  valsartan-hydrochlorothiazide (DIOVAN-HCT) 160-12.5 MG per tablet, Take 1 tablet by mouth daily., Disp: 30 tablet, Rfl: 11 .  benzonatate (TESSALON) 100 MG capsule, Take 1 capsule (100 mg total) by mouth 3 (three) times daily as needed for cough. (Patient not taking: Reported on 03/04/2016), Disp: 30 capsule, Rfl: 0  Review of Systems Nonproductive cough, chest tightness, wheezing. No fevers, chills, hemoptysis. No chest pain, palpitation. No nausea, vomiting, diarrhea, constipation. All other ROS are negative    Objective:   Physical Exam Blood pressure 132/78, pulse 74, temperature 98.4 F (36.9 C), temperature source Oral, height 5' 10.5" (1.791 m), weight 217 lb 3.2 oz (98.521 kg), SpO2 94 %. Gen: No apparent distress Neuro: No gross focal deficits. Neck: No JVD, lymphadenopathy, thyromegaly. RS: B/L wheeze. No crackles CVS: S1-S2 heard, no murmurs rubs gallops. Abdomen: Soft, positive bowel sounds. Extremities: No edema.    Assessment & Plan:  Acute exacerbation of asthma, COPD.  He has already taken a course of levofloxacin with improvement in fevers, sputum production. However his dyspnea, wheezing persists. I will treat this with a quick course of prednisone. He does not want to be on a prolonged course since steroids gives him insomnia. He was not vaccinated with the flu vaccine  this year. He does not want to be tested for the flu today besides he is already had symptoms for 2 weeks hence is past the optimal treatment window for Tamiflu.   Plan: - Quick steroid taper. Starting at 40 mg for 1 week - Follow up with Dr. Melvyn Novas.  Marshell Garfinkel MD [facility] Pager [phone] If no answer or after 3pm call: [phone] 03/04/2016, 11:33 AM

## 2016-04-10 ENCOUNTER — Ambulatory Visit: Payer: Self-pay | Admitting: Emergency Medicine

## 2016-04-14 ENCOUNTER — Ambulatory Visit (INDEPENDENT_AMBULATORY_CARE_PROVIDER_SITE_OTHER): Payer: BLUE CROSS/BLUE SHIELD | Admitting: Emergency Medicine

## 2016-04-14 ENCOUNTER — Encounter: Payer: Self-pay | Admitting: Emergency Medicine

## 2016-04-14 VITALS — BP 140/100 | HR 93 | Ht 70.5 in | Wt 214.0 lb

## 2016-04-14 DIAGNOSIS — J449 Chronic obstructive pulmonary disease, unspecified: Secondary | ICD-10-CM | POA: Diagnosis not present

## 2016-04-14 DIAGNOSIS — G4730 Sleep apnea, unspecified: Secondary | ICD-10-CM | POA: Diagnosis not present

## 2016-04-14 NOTE — Patient Instructions (Addendum)
Please continue your Spiriva and [name] as you have been taking them  Keep albuterol available to use as needed We need to work hard on stopping smoking. You need to set a quit date and then prepare for it by getting rid of all your cigarettes, lighters, ashtrays, etc. You will also need to tell everyone in your life that you are quitting and get their support, even if that means that you aren't around them for a while. Have something to keep in your mouth like sugar free candy, or a cut straw to chew on.  We will set up a home sleep study Follow with Dr Lamonte Sakai in 4 months or sooner if you have any problems.

## 2016-04-14 NOTE — Assessment & Plan Note (Signed)
He has witnessed apneas and still snores. I would like to perform a home sleep study, document his sleep apnea and then hopefully get him started on CPAP therapy.

## 2016-04-14 NOTE — Progress Notes (Signed)
Subjective:    Patient ID: Robert Church, male    DOB: 1956-05-03, 60 y.o.   MRN: [id number]  HPI Synopsis: Robert Church is a 60yo male with history of COPD, GERD, HTN, and Sinusitis. Has history of heavy tobacco use and longstanding exertional SOB  . Had an acute visit on 07/06/13  and was restarted on breo and spiriva.    ROV 08/13/15 -- follow-up visit for continued tobacco abuse, COPD, allergic rhinitis an chronic cough. He was last seen by me almost a year ago. He has been seen by T Parrett and [name] in the interim for acute exacerbations. He is been followed by [name] with cardiology for chest discomfort and HTN 01/28/15. He states that his breathing has been "terrible". He is having nocturnal awakenings, snoring. His exertional tolerance is worse. He coughs up yellow thick mucous every morning. He is on dulera, spiriva. He uses albuterol neb every morning, sometimes 2x a day. He has cut cigarettes down to 2 a day.  He has chest tightness and some burning. His nebs do not make it better. Fumes make his breathing worse.    ROV 04/14/16 -- patient has a history of tobacco abuse, COPD, allergic rhinitis, chronic cough. Also with a history of hypertension. He has been seen 3 times in this office for acute visits since my last meeting with him. The most recent was in March 2017 at which time he was treated with a prednisone taper.  He was having cough, wheeze, dyspnea - improved but has some of these problems even at baseline. He is still doing some work but is limited. Coughs at various times in the day, clear mucous. He is on Dulera + Spiriva, albuterol prn (neb and HFA). The albuterol helps him some.  He is very limited w regarding to exercise. He wakes up at night, SOB. He is down to 5cig a day. He snores and has witnessed apneas. He inquired today about whether I though he would qualify for disability       Objective:   Physical Exam Filed Vitals:   04/14/16 1533 04/14/16 1534  BP:  140/100   Pulse:  93  Height: 5' 10.5" (1.791 m)   Weight: 214 lb (97.07 kg)   SpO2:  93%    Gen: well-nourished, in no distress,   ENT: No lesions,  mouth clear, clear nasal drainage, some insp stridor  Neck: No JVD, no TMG, no carotid bruits, he does some mild stridor  Lungs: No use of accessory muscles, no lower airways wheezing  Cardiovascular: RRR, heart sounds normal, no murmur or gallops, no peripheral edema  Musculoskeletal: No deformities, no cyanosis or clubbing  Neuro: alert, non focal  Skin: Warm, no lesions or rashes   Assessment & Plan:   Sleep apnea He has witnessed apneas and still snores. I would like to perform a home sleep study, document his sleep apnea and then hopefully get him started on CPAP therapy.  COPD GOLD C Significantly symptomatic at baseline, recently exacerbates. He does have a bronchodilator response on pulmonary function testing from 2012. There was a significant decrease in his FEV1 in 2016 compared with that original study. I spent much of the visit today, approximately 75% of the 30 minute visit, counseling him on smoking cessation and techniques that we will need to use to stop smoking completely.  I wrote a summary of these recommendations for him on his checkout sheet.I don't believe there is any utility right now when  changing bronchodilators. He may benefit at some point from starting azithromycin 3 times a week. I mentioned this to him and we will reconsider in the future. he also likely will need repeat full PFT to give a true comparison to 2012. This will be particularly useful if he does decide to apply for disability.

## 2016-04-14 NOTE — Assessment & Plan Note (Signed)
Significantly symptomatic at baseline, recently exacerbates. He does have a bronchodilator response on pulmonary function testing from 2012. There was a significant decrease in his FEV1 in 2016 compared with that original study. I spent much of the visit today, approximately 75% of the 30 minute visit, counseling him on smoking cessation and techniques that we will need to use to stop smoking completely.  I wrote a summary of these recommendations for him on his checkout sheet.I don't believe there is any utility right now when changing bronchodilators. He may benefit at some point from starting azithromycin 3 times a week. I mentioned this to him and we will reconsider in the future. he also likely will need repeat full PFT to give a true comparison to 2012. This will be particularly useful if he does decide to apply for disability.

## 2016-04-29 NOTE — Progress Notes (Signed)
Patient ID: Robert Church, male   DOB: 08-04-1956, 60 y.o.   MRN: [id number]     60 y.o.  smoker referred for chest pain by Shelda Jakes NP [employer] .  Seen in 60 chest pain  Myovue low risk but not totally normal  Overall Impression: Abnormal stress nuclear study. 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 However, the LV function is normal and the apex contracts normally which suggests that this is not an infarct. I cannot exclude the possibility of apical thinning to explain the defect since the data are somewhat conflicting. Echo:  EF 50-55%  Mild LAE  Seen again 6/15 Chest pain the last few months  With fatigue and dyspnea.  Not always exertional Radiates to back on occasion.  Not positional not pleuritic  No relief with antacids  Lasts a few minutes.  Was given amlodipine for HTN a year ago and has had LE edema ever since  Wants a different BP med   Unable to walk on treadmill due to bilateral knee pain  Should have TKR but is scared to do this  Cardiac CT ordered but never done  Cozaar not effective for BP  Amlodipine caused  worse LE edema  On diovan with HCTZ  Still with episodes of SSCP radiating to arm with exertion   ROS: Denies fever, malais, weight loss, blurry vision, decreased visual acuity, cough, sputum, SOB, hemoptysis, pleuritic pain, palpitaitons, heartburn, abdominal pain, melena, lower extremity edema, claudication, or rash.  All other systems reviewed and negative   General: Affect appropriate Healthy:  appears stated age 60: normal Neck supple with no adenopathy JVP normal no bruits no thyromegaly Lungs clear with no wheezing and good diaphragmatic motion Heart:  S1/S2 no murmur,rub, gallop or click PMI normal Abdomen: benighn, BS positve, no tenderness, no AAA no bruit.  No HSM or HJR Distal pulses intact with no bruits Plus one bilateral LE edema Neuro non-focal Skin warm and dry No muscular  weakness  Medications Current Outpatient Prescriptions  Medication Sig Dispense Refill  . albuterol (PROVENTIL HFA;VENTOLIN HFA) 108 (90 BASE) MCG/ACT inhaler Inhale 2 puffs into the lungs every 6 (six) hours as needed for wheezing. 1 Inhaler 11  . albuterol (PROVENTIL) (2.5 MG/3ML) 0.083% nebulizer solution Take 3 mLs (2.5 mg total) by nebulization every 6 (six) hours as needed for wheezing or shortness of breath. 75 mL 12  . ALPRAZolam (XANAX) 1 MG tablet TAKE 1 TABLET BY MOUTH EVERY NIGHT AT BEDTIME AS NEEDED 30 tablet 0  . Azelastine-Fluticasone 137-50 MCG/ACT SUSP 1 spray each nostril twice daily 23 g 6  . levocetirizine (XYZAL) 5 MG tablet Take 5 mg by mouth at bedtime.  0  . mometasone-formoterol (DULERA) 200-5 MCG/ACT AERO Take 2 puffs first thing in am and then another 2 puffs about 12 hours later. 1 Inhaler 11  . omeprazole (PRILOSEC) 20 MG capsule Take 20 mg by mouth daily. ACID REFLUX    . Tiotropium Bromide Monohydrate (SPIRIVA RESPIMAT) 2.5 MCG/ACT AERS Inhale 2 puffs into the lungs daily. 1 Inhaler 0  . valsartan-hydrochlorothiazide (DIOVAN-HCT) 160-12.5 MG per tablet Take 1 tablet by mouth daily. 30 tablet 11   No current facility-administered medications for this visit.    Allergies E-mycin and Penicillins  Family History: Family History  Problem Relation Age of Onset  . Lung cancer Father   . Brain cancer Father     Social History: Social History   Social History  .  Marital Status: Single    Spouse Name: N/A  . Number of Children: N/A  . Years of Education: N/A   Occupational History  . Not on file.   Social History Main Topics  . Smoking status: Current Every Day Smoker -- 0.25 packs/day for 46 years    Types: Cigarettes  . Smokeless tobacco: Never Used  . Alcohol Use: No  . Drug Use: No  . Sexual Activity: Yes    Birth Control/ Protection: None   Other Topics Concern  . Not on file   Social History Narrative    Electrocardiogram:  5/15  SR  RBBB nonspecific ST/T wave changes   Assessment and Plan Chest Pain: recurrent unable to walk previously normal myovue 2012 felt horrible with lexiscan will order cardiac CTA BMET today HTN: Well controlled.  Continue current medications and low sodium Dash type diet.   COPD  Decrease in PFTls needs to stop smoking conitnue inhalers f/u pulmonary GERD low carb diet weight loss and prilosec  Jenkins Rouge

## 2016-04-30 ENCOUNTER — Encounter: Payer: Self-pay | Admitting: Cardiovascular Disease

## 2016-04-30 ENCOUNTER — Ambulatory Visit (INDEPENDENT_AMBULATORY_CARE_PROVIDER_SITE_OTHER): Payer: BLUE CROSS/BLUE SHIELD | Admitting: Cardiovascular Disease

## 2016-04-30 VITALS — BP 146/84 | HR 73 | Ht 70.5 in | Wt 212.1 lb

## 2016-04-30 DIAGNOSIS — Z79899 Other long term (current) drug therapy: Secondary | ICD-10-CM | POA: Diagnosis not present

## 2016-04-30 DIAGNOSIS — I10 Essential (primary) hypertension: Secondary | ICD-10-CM

## 2016-04-30 DIAGNOSIS — R0789 Other chest pain: Secondary | ICD-10-CM | POA: Diagnosis not present

## 2016-04-30 LAB — BASIC METABOLIC PANEL
BUN: 17 mg/dL (ref 7–25)
CO2: 28 mmol/L (ref 20–31)
Calcium: 8.7 mg/dL (ref 8.6–10.3)
Chloride: 103 mmol/L (ref 98–110)
Creat: 0.85 mg/dL (ref 0.70–1.33)
Glucose, Bld: 120 mg/dL — ABNORMAL HIGH (ref 65–99)
Potassium: 3.8 mmol/L (ref 3.5–5.3)
Sodium: 141 mmol/L (ref 135–146)

## 2016-04-30 NOTE — Patient Instructions (Addendum)
Medication Instructions:  Your physician recommends that you continue on your current medications as directed. Please refer to the Current Medication list given to you today.  Labwork: Your physician recommends that you have lab work today- bmet   Testing/Procedures: Your physician has requested that you have cardiac CT on Thursday 05/07/16. Cardiac computed tomography (CT) is a painless test that uses an x-ray machine to take clear, detailed pictures of your heart. For further information please visit [email or web address]. Please follow instruction sheet as given.  Follow-Up: Your physician wants you to follow-up in: 6 months with Dr. Johnsie Cancel. You will receive a reminder letter in the mail two months in advance. If you don't receive a letter, please call our office to schedule the follow-up appointment.   If you need a refill on your cardiac medications before your next appointment, please call your pharmacy.

## 2016-05-01 ENCOUNTER — Telehealth: Payer: Self-pay | Admitting: Emergency Medicine

## 2016-05-01 DIAGNOSIS — G4730 Sleep apnea, unspecified: Secondary | ICD-10-CM

## 2016-05-04 ENCOUNTER — Telehealth: Payer: Self-pay | Admitting: Cardiovascular Disease

## 2016-05-04 NOTE — Telephone Encounter (Signed)
Follow Up   Pt states that he is returning the call. Please call back to discuss

## 2016-05-04 NOTE — Telephone Encounter (Signed)
There is no order in his chart for home sleep study-not under procedures Joellen Jersey

## 2016-05-04 NOTE — Telephone Encounter (Signed)
Sleep apnea - Collene Gobble, MD at 04/14/2016 5:08 PM     Status: Written Related Problem: Sleep apnea   Expand All Collapse All   He has witnessed apneas and still snores. I would like to perform a home sleep study, document his sleep apnea and then hopefully get him started on CPAP therapy.         Order placed.

## 2016-05-04 NOTE — Telephone Encounter (Signed)
Patient aware of lab results.

## 2016-05-07 ENCOUNTER — Other Ambulatory Visit: Payer: Self-pay | Admitting: Emergency Medicine

## 2016-05-07 ENCOUNTER — Ambulatory Visit (HOSPITAL_COMMUNITY)
Admission: RE | Admit: 2016-05-07 | Discharge: 2016-05-07 | Disposition: A | Discharge status: Home / Self Care | Payer: BLUE CROSS/BLUE SHIELD | Source: Ambulatory Visit | Attending: Cardiovascular Disease | Admitting: Cardiovascular Disease

## 2016-05-07 DIAGNOSIS — G4733 Obstructive sleep apnea (adult) (pediatric): Secondary | ICD-10-CM

## 2016-05-07 DIAGNOSIS — I289 Disease of pulmonary vessels, unspecified: Secondary | ICD-10-CM | POA: Diagnosis not present

## 2016-05-07 DIAGNOSIS — I10 Essential (primary) hypertension: Secondary | ICD-10-CM

## 2016-05-07 DIAGNOSIS — R918 Other nonspecific abnormal finding of lung field: Secondary | ICD-10-CM | POA: Insufficient documentation

## 2016-05-07 DIAGNOSIS — R0789 Other chest pain: Secondary | ICD-10-CM | POA: Insufficient documentation

## 2016-05-07 DIAGNOSIS — I7789 Other specified disorders of arteries and arterioles: Secondary | ICD-10-CM | POA: Diagnosis not present

## 2016-05-07 DIAGNOSIS — I7090 Unspecified atherosclerosis: Secondary | ICD-10-CM | POA: Insufficient documentation

## 2016-05-07 MED ORDER — NITROGLYCERIN 0.4 MG SL SUBL
SUBLINGUAL_TABLET | SUBLINGUAL | Status: DC
Start: 2016-05-07 — End: 2016-05-08
  Filled 2016-05-07: qty 2

## 2016-05-07 MED ORDER — IOPAMIDOL (ISOVUE-370) INJECTION 76%
INTRAVENOUS | Status: AC
  Administered 2016-05-07: 80 mL via INTRAVENOUS
  Filled 2016-05-07: qty 100

## 2016-05-07 MED ORDER — NITROGLYCERIN 0.4 MG SL SUBL
0.8000 mg | SUBLINGUAL_TABLET | Freq: Once | SUBLINGUAL | Status: AC
  Administered 2016-05-07: 0.8 mg via SUBLINGUAL

## 2016-05-07 MED ORDER — METOPROLOL TARTRATE 5 MG/5ML IV SOLN
5.0000 mg | INTRAVENOUS | Status: DC | PRN
  Administered 2016-05-07 (×3): 5 mg via INTRAVENOUS

## 2016-05-07 MED ORDER — METOPROLOL TARTRATE 5 MG/5ML IV SOLN
INTRAVENOUS | Status: AC
  Filled 2016-05-07: qty 15

## 2016-05-07 NOTE — Progress Notes (Signed)
CT completed. Tolerated well. D/C home walking with parents. Awake and alert. In no distress.

## 2016-05-08 ENCOUNTER — Telehealth: Payer: Self-pay

## 2016-05-08 DIAGNOSIS — R931 Abnormal findings on diagnostic imaging of heart and coronary circulation: Secondary | ICD-10-CM

## 2016-05-08 NOTE — Telephone Encounter (Signed)
Left message for patient to call back. Need to make appointment for lab work. Patient will need to be fasting at time of lab work. Labs have been ordered.

## 2016-05-08 NOTE — Telephone Encounter (Signed)
Pt returned the call.

## 2016-05-08 NOTE — Telephone Encounter (Signed)
-----   Message from Josue Hector, MD sent at 05/07/2016  6:16 PM EDT ----- I Called patient needs fasting lipid and liver next week and start statin if LDL over 100

## 2016-05-08 NOTE — Telephone Encounter (Signed)
Called patient back about making lab appointment. Patient will come in on 5/23 for fasting lipid and liver profile.

## 2016-05-12 ENCOUNTER — Other Ambulatory Visit (INDEPENDENT_AMBULATORY_CARE_PROVIDER_SITE_OTHER): Payer: BLUE CROSS/BLUE SHIELD | Admitting: *Deleted

## 2016-05-12 ENCOUNTER — Encounter (HOSPITAL_BASED_OUTPATIENT_CLINIC_OR_DEPARTMENT_OTHER): Payer: Self-pay

## 2016-05-12 DIAGNOSIS — I2510 Atherosclerotic heart disease of native coronary artery without angina pectoris: Secondary | ICD-10-CM | POA: Diagnosis not present

## 2016-05-12 DIAGNOSIS — R931 Abnormal findings on diagnostic imaging of heart and coronary circulation: Secondary | ICD-10-CM

## 2016-05-12 LAB — HEPATIC FUNCTION PANEL
ALT: 24 U/L (ref 9–46)
AST: 22 U/L (ref 10–35)
Albumin: 4.1 g/dL (ref 3.6–5.1)
Alkaline Phosphatase: 44 U/L (ref 40–115)
Bilirubin, Direct: 0.1 mg/dL (ref <=0.2)
Indirect Bilirubin: 0.5 mg/dL (ref 0.2–1.2)
Total Bilirubin: 0.6 mg/dL (ref 0.2–1.2)
Total Protein: 6.4 g/dL (ref 6.1–8.1)

## 2016-05-12 LAB — LIPID PANEL
Cholesterol: 197 mg/dL (ref 125–200)
HDL: 36 mg/dL — ABNORMAL LOW (ref >=40)
LDL Cholesterol: 91 mg/dL (ref <130)
Total CHOL/HDL Ratio: 5.5 Ratio — ABNORMAL HIGH (ref <=5.0)
Triglycerides: 350 mg/dL — ABNORMAL HIGH (ref <150)
VLDL: 70 mg/dL — ABNORMAL HIGH (ref <30)

## 2016-05-13 ENCOUNTER — Telehealth: Payer: Self-pay

## 2016-05-13 DIAGNOSIS — E785 Hyperlipidemia, unspecified: Secondary | ICD-10-CM

## 2016-05-13 DIAGNOSIS — E782 Mixed hyperlipidemia: Secondary | ICD-10-CM

## 2016-05-13 MED ORDER — ATORVASTATIN CALCIUM 10 MG PO TABS: 5.0000 mg | ORAL_TABLET | Freq: Every day | ORAL | Status: DC

## 2016-05-13 MED ORDER — FENOFIBRATE 145 MG PO TABS: 145.0000 mg | ORAL_TABLET | Freq: Every day | ORAL | Status: DC

## 2016-05-13 NOTE — Telephone Encounter (Signed)
-----   Message from Josue Hector, MD sent at 05/12/2016  5:15 PM EDT ----- Lipids high and triglycerides even higher start lipitor 5 mg daily and tricor 145 mg f/u labs in 3 months

## 2016-05-13 NOTE — Telephone Encounter (Signed)
Called patient's medication to [facility] [phone].

## 2016-05-13 NOTE — Telephone Encounter (Signed)
Called patient about his lab results. Per Dr. Johnsie Cancel, Lipids high and triglycerides even higher start lipitor 5 mg daily and tricor 145 mg f/u labs in 3 months. Patient wants prescriptions faxed to his work pharmacy, but does not have the number. Patient will call back with fax number. Patient has lab appt 08/11/16 for repeat labwork.

## 2016-07-01 ENCOUNTER — Ambulatory Visit (INDEPENDENT_AMBULATORY_CARE_PROVIDER_SITE_OTHER): Payer: BLUE CROSS/BLUE SHIELD | Admitting: Acute Care

## 2016-07-01 ENCOUNTER — Ambulatory Visit (INDEPENDENT_AMBULATORY_CARE_PROVIDER_SITE_OTHER)
Admission: RE | Admit: 2016-07-01 | Discharge: 2016-07-01 | Disposition: A | Discharge status: Home / Self Care | Payer: BLUE CROSS/BLUE SHIELD | Source: Ambulatory Visit | Attending: Acute Care | Admitting: Acute Care

## 2016-07-01 ENCOUNTER — Encounter: Payer: Self-pay | Admitting: Acute Care

## 2016-07-01 VITALS — BP 130/76 | HR 71 | Ht 70.0 in | Wt 210.0 lb

## 2016-07-01 DIAGNOSIS — J449 Chronic obstructive pulmonary disease, unspecified: Secondary | ICD-10-CM | POA: Diagnosis not present

## 2016-07-01 DIAGNOSIS — R0600 Dyspnea, unspecified: Secondary | ICD-10-CM

## 2016-07-01 MED ORDER — CLOTRIMAZOLE 10 MG MT TROC: 10.0000 mg | Freq: Every day | OROMUCOSAL | Status: DC

## 2016-07-01 MED ORDER — DOXYCYCLINE HYCLATE 100 MG PO TABS: 100.0000 mg | ORAL_TABLET | Freq: Two times a day (BID) | ORAL | Status: DC

## 2016-07-01 NOTE — Progress Notes (Signed)
History of Present Illness Robert Church is a 60 y.o. male  Current smoker with history of COPD, GERD, HTN, and Sinusitis. Has history of heavy tobacco use and longstanding exertional SOB .    7/12/2017Acute Visit. Pt. Presents to the office today because his occupational physician at his work place checked an oxygen saturation at the office ( routine visit) and he was found to have a saturation of 85-92%. He was sent home and told to come and see his pulmonary doctor. He states he is compliant with his Dulera + Spiriva, albuterol prn (neb and HFA).Marland Kitchen He is coughing up yellow secretions, in average amounts. He states he is using his Azelastatin daily and nasal saline for sinus drainage. He is compliant with his Prilosec. He states he is at his baseline and thinks the saturation monitor at his work place was not functioning correctly. His saturations today in the office were 95-95%.He denies fever, chest pain, purulent secretions, orthopnea or hemoptysis. He is continuing to smoke 5-10 cigarettes daily. I counseled him extensively on smoking cessation. I explained that it is the single most powerful action that he can take to improve his pulmonary function.He denies leg or calf pain. He needs a letter to return to work today. We will walk him in the office today. We will check a CXR. If he does not have desaturations, and CXR is normal we will send a letter to allow him to return to work.   Tests CXR 07/01/2016 COMPARISON: Chest radiograph February 26, 2016 and chest CT May 07, 2016  FINDINGS: There is no edema or consolidation. The heart size and pulmonary vascularity are normal. No adenopathy. There is atherosclerotic calcification in the aortic arch. No bone lesions.  IMPRESSION: No edema or consolidation. Aortic atherosclerosis.  Past medical hx Past Medical History  Diagnosis Date  . COPD (chronic obstructive pulmonary disease) ([place])   . SOB (shortness of breath)   . Bronchitis   .  GERD (gastroesophageal reflux disease)   . Hypertension   . Pneumonia   . Sinusitis   . Allergy   . Asthma      Past surgical hx, Family hx, Social hx all reviewed.  Current Outpatient Prescriptions on File Prior to Visit  Medication Sig  . albuterol (PROVENTIL HFA;VENTOLIN HFA) 108 (90 BASE) MCG/ACT inhaler Inhale 2 puffs into the lungs every 6 (six) hours as needed for wheezing.  Marland Kitchen albuterol (PROVENTIL) (2.5 MG/3ML) 0.083% nebulizer solution Take 3 mLs (2.5 mg total) by nebulization every 6 (six) hours as needed for wheezing or shortness of breath.  . ALPRAZolam (XANAX) 1 MG tablet TAKE 1 TABLET BY MOUTH EVERY NIGHT AT BEDTIME AS NEEDED  . atorvastatin (LIPITOR) 10 MG tablet Take 0.5 tablets (5 mg total) by mouth daily.  . Azelastine-Fluticasone 137-50 MCG/ACT SUSP 1 spray each nostril twice daily  . fenofibrate (TRICOR) 145 MG tablet Take 1 tablet (145 mg total) by mouth daily.  . mometasone-formoterol (DULERA) 200-5 MCG/ACT AERO Take 2 puffs first thing in am and then another 2 puffs about 12 hours later.  Marland Kitchen omeprazole (PRILOSEC) 20 MG capsule Take 20 mg by mouth daily. ACID REFLUX  . Tiotropium Bromide Monohydrate (SPIRIVA RESPIMAT) 2.5 MCG/ACT AERS Inhale 2 puffs into the lungs daily.  . valsartan-hydrochlorothiazide (DIOVAN-HCT) 160-12.5 MG per tablet Take 1 tablet by mouth daily.   No current facility-administered medications on file prior to visit.     Allergies  Allergen Reactions  . E-Mycin [Erythromycin Base] Swelling  .  Penicillins Other (See Comments)    Pt took as a child and dr told him if he took again it would kill him.    Review Of Systems:  Constitutional:   No  weight loss, night sweats,  Fevers, chills, fatigue, or  lassitude.  HEENT:   No headaches,  Difficulty swallowing,  Tooth/dental problems, or  Sore throat,                No sneezing, itching, ear ache, +nasal congestion, +post nasal drip,   CV:  No chest pain,  Orthopnea, PND, swelling in lower  extremities, anasarca, dizziness, palpitations, syncope.   GI  No heartburn, indigestion, abdominal pain, nausea, vomiting, diarrhea, change in bowel habits, loss of appetite, bloody stools.   Resp: + shortness of breath with exertion not  at rest.  + excess mucus, + productive cough,  No non-productive cough,  No coughing up of blood.  + change in color of mucus.  No wheezing.  No chest wall deformity  Skin: no rash or lesions.  GU: no dysuria, change in color of urine, no urgency or frequency.  No flank pain, no hematuria   MS:  No joint pain or swelling.  No decreased range of motion.  No back pain.  Psych:  No change in mood or affect. No depression or anxiety.  No memory loss.   Vital Signs BP 130/76 mmHg  Pulse 71  Ht 5\' 10"  (1.778 m)  Wt 210 lb (95.255 kg)  BMI 30.13 kg/m2  SpO2 96%   Physical Exam:  General- No distress,  A&Ox3 ENT: No sinus tenderness, TM clear, pale nasal mucosa, no oral exudate,no post nasal drip, no LAN Cardiac: S1, S2, regular rate and rhythm, no murmur Chest: No wheeze/ rales/ dullness;diminished bilaterally per bases, no accessory muscle use, no nasal flaring, no sternal retractions Abd.: Soft Non-tender Ext: No clubbing cyanosis, edema Neuro:  normal strength Skin: No rashes, warm and dry Psych: normal mood and behavior   Assessment/Plan  COPD GOLD C Your oxygen saturation was 95% today in the office. CXR today.( Reviewed personally by me, no edema or consolidation) We will walk you in the office to see if you desaturate. Doxycycline 100 mg twice daily for 7 days. Mycelex Troches 5 x daily for 7 days for thrush.  We will call you with results. We will schedule you for full PFT's  Continue your  Dulera + Spiriva, albuterol prn (neb and HFA). Rinse your mouth with water after using your inhalers and neb treatments. Continue your nasal saline and Azelastine nasal spray for your sinus drainage. Please stop smoking. This is the single  most powerful action that you can take to improve your breathing status. I have given you the " Be Stronger  than your excuses: card.  Please call for free nicotine replacement therapy. Follow up in 2 weeks with me. Schedule appointment with [facility] at first available after PFT's to review and consider change in treatment.       [name], NP 07/01/2016  3:05 PM

## 2016-07-01 NOTE — Assessment & Plan Note (Signed)
Your oxygen saturation was 95% today in the office. CXR today.( Reviewed personally by me, no edema or consolidation) We will walk you in the office to see if you desaturate. Doxycycline 100 mg twice daily for 7 days. Mycelex Troches 5 x daily for 7 days for thrush.  We will call you with results. We will schedule you for full PFT's  Continue your  Dulera + Spiriva, albuterol prn (neb and HFA). Rinse your mouth with water after using your inhalers and neb treatments. Continue your nasal saline and Azelastine nasal spray for your sinus drainage. Please stop smoking. This is the single most powerful action that you can take to improve your breathing status. I have given you the " Be Stronger  than your excuses: card.  Please call for free nicotine replacement therapy. Follow up in 2 weeks with me. Schedule appointment with Byrum at first available after PFT's to review and consider change in treatment.

## 2016-07-01 NOTE — Patient Instructions (Addendum)
It is nice to meet you today. Your oxygen saturation was 95% today in the office. CXR today. We will walk you in the office to see if you desaturate. Doxycycline 100 mg twice daily for 7 days. Mycelex Troches 5 x daily for 7 days for thrush.  We will call you with results. We will schedule you for full PFT's  Continue your  Dulera + Spiriva, albuterol prn (neb and HFA). Rinse your mouth with water after using your inhalers and neb treatments. Continue your nasal saline and Azelastine nasal spray for your sinus drainage. Please stop smoking. This is the single most powerful action that you can take to improve your breathing status. I have given you the " Be Stronger  than your excuses: card.  Please call for free nicotine replacement therapy. Follow up in 2 weeks with me. Schedule appointment with Byrum at first available after PFT's to review and consider change in treatment.

## 2016-07-14 ENCOUNTER — Telehealth: Payer: Self-pay | Admitting: Emergency Medicine

## 2016-07-14 NOTE — Telephone Encounter (Signed)
Lmtcb x1

## 2016-07-15 NOTE — Telephone Encounter (Signed)
Spoke with pt. He is requesting results from his CXR done on 07/01/16. SG is on vacation.  MR - please advise on results. Thanks.

## 2016-07-15 NOTE — Telephone Encounter (Signed)
lmomtcb x2 for pt 

## 2016-07-15 NOTE — Telephone Encounter (Signed)
cxr lung fields  - are clear without mass or pneumonia  ....................  CLINICAL DATA:  Shortness of breath for 2 weeks  EXAM: CHEST  2 VIEW  COMPARISON:  Chest radiograph February 26, 2016 and chest CT May 07, 2016  FINDINGS: There is no edema or consolidation. The heart size and pulmonary vascularity are normal. No adenopathy. There is atherosclerotic calcification in the aortic arch. No bone lesions.  IMPRESSION: No edema or consolidation.  Aortic atherosclerosis.   Electronically Signed   By: Lowella Grip III M.D.   On: 07/01/2016 13:56

## 2016-07-15 NOTE — Telephone Encounter (Signed)
Spoke with pt. Pt is aware of results. Nothing further was needed.  

## 2016-07-22 ENCOUNTER — Ambulatory Visit (INDEPENDENT_AMBULATORY_CARE_PROVIDER_SITE_OTHER): Payer: BLUE CROSS/BLUE SHIELD | Admitting: Acute Care

## 2016-07-22 ENCOUNTER — Encounter: Payer: Self-pay | Admitting: Acute Care

## 2016-07-22 DIAGNOSIS — J449 Chronic obstructive pulmonary disease, unspecified: Secondary | ICD-10-CM

## 2016-07-22 NOTE — Progress Notes (Signed)
History of Present Illness Robert Church is a 60 y.o. male current smoker with COPD Gold Stage C, GERD, HTN and Sinusitis. He has a history of heavy tobacco use and longstanding exertional SOB.   07/22/2016 Follow Up OV: Pt. Returns for 2 week follow up for exacerbation of his COPD Gold C.He states he is doing well. He has returned to work and has had less dyspnea. He completed his treatment with Doxycycline and is continuing his compliance with his Dulera + Spiriva, albuterol prn (neb and HFA).He is working hard at quitting smoking. He has been able to obtain Nicorette 2 mg lozenges through work. He states he is smoking 2-3 cigarettes daily, which is much improved. He denies chest pain, fever, purulent secretions, orthopnea or hemoptysis.He denied leg or calf pain.PFT's are pending to be completed before his appointment with [name] in August.He wants to see him for review of PFT's and suggestions for treatment, however working on smoking cessation seems to be helping his pulmonary status significantly.He does have a cough that is slightly productive for clear secretions.He is continuing to use his Mycelex troches.  Tests  07/01/2016: CXR There is no edema or consolidation. The heart size and pulmonary vascularity are normal. No adenopathy. There is atherosclerotic calcification in the aortic arch. No bone lesions.   Past medical hx Past Medical History:  Diagnosis Date  . Allergy   . Asthma   . Bronchitis   . COPD (chronic obstructive pulmonary disease) ([place])   . GERD (gastroesophageal reflux disease)   . Hypertension   . Pneumonia   . Sinusitis   . SOB (shortness of breath)      Past surgical hx, Family hx, Social hx all reviewed.  Current Outpatient Prescriptions on File Prior to Visit  Medication Sig  . albuterol (PROVENTIL HFA;VENTOLIN HFA) 108 (90 BASE) MCG/ACT inhaler Inhale 2 puffs into the lungs every 6 (six) hours as needed for wheezing.  Marland Kitchen albuterol (PROVENTIL) (2.5  MG/3ML) 0.083% nebulizer solution Take 3 mLs (2.5 mg total) by nebulization every 6 (six) hours as needed for wheezing or shortness of breath.  . ALPRAZolam (XANAX) 1 MG tablet TAKE 1 TABLET BY MOUTH EVERY NIGHT AT BEDTIME AS NEEDED  . atorvastatin (LIPITOR) 10 MG tablet Take 0.5 tablets (5 mg total) by mouth daily.  . Azelastine-Fluticasone 137-50 MCG/ACT SUSP 1 spray each nostril twice daily  . clotrimazole (MYCELEX) 10 MG troche Take 1 tablet (10 mg total) by mouth 5 (five) times daily.  . fenofibrate (TRICOR) 145 MG tablet Take 1 tablet (145 mg total) by mouth daily.  . mometasone-formoterol (DULERA) 200-5 MCG/ACT AERO Take 2 puffs first thing in am and then another 2 puffs about 12 hours later.  Marland Kitchen omeprazole (PRILOSEC) 20 MG capsule Take 20 mg by mouth daily. ACID REFLUX  . Tiotropium Bromide Monohydrate (SPIRIVA RESPIMAT) 2.5 MCG/ACT AERS Inhale 2 puffs into the lungs daily.  . valsartan-hydrochlorothiazide (DIOVAN-HCT) 160-12.5 MG per tablet Take 1 tablet by mouth daily.   No current facility-administered medications on file prior to visit.      Allergies  Allergen Reactions  . E-Mycin [Erythromycin Base] Swelling  . Penicillins Other (See Comments)    Pt took as a child and dr told him if he took again it would kill him.    Review Of Systems:  Constitutional:   No  weight loss, night sweats,  Fevers, chills, fatigue, or  lassitude.  HEENT:   No headaches,  Difficulty swallowing,  Tooth/dental problems,  or  Sore throat,                No sneezing, itching, ear ache, nasal congestion, post nasal drip,   CV:  No chest pain,  Orthopnea, PND, swelling in lower extremities, anasarca, dizziness, palpitations, syncope.   GI  No heartburn, indigestion, abdominal pain, nausea, vomiting, diarrhea, change in bowel habits, loss of appetite, bloody stools.   Resp: + shortness of breath with exertion not at rest.  No excess mucus, + productive cough,  No non-productive cough,  No coughing up  of blood.  No change in color of mucus.  + wheezing.  No chest wall deformity  Skin: no rash or lesions.  GU: no dysuria, change in color of urine, no urgency or frequency.  No flank pain, no hematuria   MS:  No joint pain or swelling.  No decreased range of motion.  No back pain.  Psych:  No change in mood or affect. No depression or anxiety.  No memory loss.   Vital Signs BP (!) 142/78 (BP Location: Left Arm, Cuff Size: Normal)   Pulse 90   Ht 5\' 10"  (1.778 m)   Wt 214 lb (97.1 kg)   SpO2 95%   BMI 30.71 kg/m    Physical Exam:  General- No distress,  A&Ox3, pleasant ENT: No sinus tenderness, TM clear, pale nasal mucosa, no oral exudate,no post nasal drip, no LAN, + thrush Cardiac: S1, S2, regular rate and rhythm, no murmur Chest: Trace  wheeze/ rales/ dullness; no accessory muscle use, no nasal flaring, no sternal retractions Abd.: Soft Non-tender Ext: No clubbing cyanosis, edema Neuro:  normal strength Skin: No rashes, warm and dry Psych: normal mood and behavior   Assessment/Plan  COPD GOLD C Resolved mild COPD flare Working hard on quitting smoking Still has some oral thrush Plan: Continue using your Nicorette Lozenges as you are doing. Get PFT's done as scheduled Finish using the Mycelex troches Continue your  Dulera + Spiriva, albuterol prn (neb and HFA). Rinse your mouth with water after using your inhalers and neb treatments. Continue your nasal saline and Azelastine nasal spray for your sinus drainage. Follow up with [name] August 24th  As scheduled. Please contact office for sooner follow up if symptoms do not improve or worsen or seek emergency care      [name], NP 07/22/2016  10:44 AM

## 2016-07-22 NOTE — Patient Instructions (Addendum)
Congratulations on working on quitting smoking. You are doing a great job!! Continue using your Nicorette Lozenges as you are doing. Get PFT's done as scheduled Continue your  Dulera + Spiriva, albuterol prn (neb and HFA). Rinse your mouth with water after using your inhalers and neb treatments. Continue your nasal saline and Azelastine nasal spray for your sinus drainage. Follow up with [name] August 24th  As scheduled. Please contact office for sooner follow up if symptoms do not improve or worsen or seek emergency care

## 2016-07-22 NOTE — Assessment & Plan Note (Addendum)
Resolved mild COPD flare Working hard on quitting smoking Still has some oral thrush Plan: Continue using your Nicorette Lozenges as you are doing. Get PFT's done as scheduled Finish using the Mycelex troches Continue your  Dulera + Spiriva, albuterol prn (neb and HFA). Rinse your mouth with water after using your inhalers and neb treatments. Continue your nasal saline and Azelastine nasal spray for your sinus drainage. Follow up with [name] August 24th  As scheduled. Please contact office for sooner follow up if symptoms do not improve or worsen or seek emergency care

## 2016-08-11 ENCOUNTER — Other Ambulatory Visit: Payer: Self-pay

## 2016-08-13 ENCOUNTER — Encounter: Payer: Self-pay | Admitting: Emergency Medicine

## 2016-08-13 ENCOUNTER — Ambulatory Visit (INDEPENDENT_AMBULATORY_CARE_PROVIDER_SITE_OTHER): Payer: BLUE CROSS/BLUE SHIELD | Admitting: Emergency Medicine

## 2016-08-13 DIAGNOSIS — J449 Chronic obstructive pulmonary disease, unspecified: Secondary | ICD-10-CM

## 2016-08-13 DIAGNOSIS — Z720 Tobacco use: Secondary | ICD-10-CM | POA: Diagnosis not present

## 2016-08-13 DIAGNOSIS — J302 Other seasonal allergic rhinitis: Secondary | ICD-10-CM | POA: Diagnosis not present

## 2016-08-13 DIAGNOSIS — F17210 Nicotine dependence, cigarettes, uncomplicated: Secondary | ICD-10-CM

## 2016-08-13 NOTE — Progress Notes (Signed)
Subjective:    Patient ID: Robert Church, male    DOB: 08/13/1956, 60 y.o.   MRN: [id number]  HPI Synopsis: Thomas Buitrago is a 60yo male with history of COPD, GERD, HTN, and Sinusitis. Has history of heavy tobacco use and longstanding exertional SOB  . Had an acute visit on 07/06/13  and was restarted on breo and spiriva.    ROV 08/13/15 -- follow-up visit for continued tobacco abuse, COPD, allergic rhinitis an chronic cough. He was last seen by me almost a year ago. He has been seen by T Parrett and [name] in the interim for acute exacerbations. He is been followed by [name] with cardiology for chest discomfort and HTN 01/28/15. He states that his breathing has been "terrible". He is having nocturnal awakenings, snoring. His exertional tolerance is worse. He coughs up yellow thick mucous every morning. He is on dulera, spiriva. He uses albuterol neb every morning, sometimes 2x a day. He has cut cigarettes down to 2 a day.  He has chest tightness and some burning. His nebs do not make it better. Fumes make his breathing worse.    ROV 04/14/16 -- patient has a history of tobacco abuse, COPD, allergic rhinitis, chronic cough. Also with a history of hypertension. He has been seen 3 times in this office for acute visits since my last meeting with him. The most recent was in March 2017 at which time he was treated with a prednisone taper.  He was having cough, wheeze, dyspnea - improved but has some of these problems even at baseline. He is still doing some work but is limited. Coughs at various times in the day, clear mucous. He is on Dulera + Spiriva, albuterol prn (neb and HFA). The albuterol helps him some.  He is very limited w regarding to exercise. He wakes up at night, SOB. He is down to 5cig a day. He snores and has witnessed apneas. He inquired today about whether I though he would qualify for disability  ROV 08/13/16 --  As a follow-up visit for severe COPD, continued tobacco use, allergic rhinitis,  chronic cough. He also has untreated obstructive sleep apnea. He experienced an acute exacerbation in July and was treated with antibiotics and prednisone. He was to get PFT today but did not feel he was able. He has a mold / dust exposure about 10 days ago, has caused him to have increased SOB, wheeze, cough, mucous from sinuses and chest. He is using nicotine replacement, smoking a few a day. Using albuterol 3-4x a day. He is on spiriva and dulera, changed from breo.      Objective:   Physical Exam Vitals:   08/13/16 1405 08/13/16 1406  BP:  (!) 142/96  Pulse:  90  SpO2:  95%  Weight: 219 lb (99.3 kg)   Height: 5\' 10"  (1.778 m)     Gen: well-nourished, in no distress,   ENT: No lesions,  mouth clear, clear nasal drainage, some insp stridor  Neck: No JVD, no TMG, no carotid bruits, he does some mild stridor  Lungs: No use of accessory muscles, no lower airways wheezing  Cardiovascular: RRR, heart sounds normal, no murmur or gallops, no peripheral edema  Musculoskeletal: No deformities, no cyanosis or clubbing  Neuro: alert, non focal  Skin: Warm, no lesions or rashes   Assessment & Plan:   COPD GOLD C Please continue spiriva and dulera as you are taking them  Work hard on stopping your smoking altogether.  Use albuterol as needed for shortness of breath.  Follow with Dr Lamonte Sakai in 3 months or sooner if you have any problems.  Seasonal allergic rhinitis Restart fluticasone nasal spray, 2 sprays each nostril twice a day  Use astelin nasal spray 2 sprays as needed for congestion  Cigarette smoker Discussed cessation today   Baltazar Apo, MD, PhD 08/13/2016, 2:50 PM [facility] [phone] or if no answer [phone]

## 2016-08-13 NOTE — Assessment & Plan Note (Signed)
Please continue spiriva and dulera as you are taking them  Work hard on stopping your smoking altogether.  Use albuterol as needed for shortness of breath.  Follow with Dr Lamonte Sakai in 3 months or sooner if you have any problems.

## 2016-08-13 NOTE — Patient Instructions (Addendum)
Please continue spiriva and dulera as you are taking them  Work hard on stopping your smoking altogether.  Use albuterol as needed for shortness of breath.  Restart fluticasone nasal spray, 2 sprays each nostril twice a day  Use astelin nasal spray 2 sprays as needed for congestion Follow with Dr Lamonte Sakai in 3 months or sooner if you have any problems.

## 2016-08-13 NOTE — Assessment & Plan Note (Signed)
Restart fluticasone nasal spray, 2 sprays each nostril twice a day  Use astelin nasal spray 2 sprays as needed for congestion

## 2016-08-13 NOTE — Assessment & Plan Note (Signed)
Discussed cessation today. 

## 2016-08-18 ENCOUNTER — Ambulatory Visit: Payer: Self-pay | Admitting: Emergency Medicine

## 2016-08-27 ENCOUNTER — Other Ambulatory Visit: Payer: Self-pay | Admitting: Acute Care

## 2016-08-27 ENCOUNTER — Telehealth: Payer: Self-pay | Admitting: Acute Care

## 2016-08-27 MED ORDER — DOXYCYCLINE HYCLATE 100 MG PO TABS: 100.0000 mg | ORAL_TABLET | Freq: Two times a day (BID) | ORAL | 0 refills | Status: DC

## 2016-08-27 NOTE — Telephone Encounter (Signed)
Called and spoke with pt and he is aware of rx for the doxy that has been sent to the pharmacy.  Nothing further is needed.

## 2016-08-27 NOTE — Telephone Encounter (Signed)
Patient needs rx to be done before 1pm

## 2016-08-27 NOTE — Telephone Encounter (Signed)
Sorry I missed that there was a time urgency til I saw the note. Ok to refill doxy.

## 2016-08-27 NOTE — Telephone Encounter (Signed)
Last ov on 08/13/16 with RB Instructions   Please continue spiriva and dulera as you are taking them  Work hard on stopping your smoking altogether.  Use albuterol as needed for shortness of breath.  Restart fluticasone nasal spray, 2 sprays each nostril twice a day  Use astelin nasal spray 2 sprays as needed for congestion Follow with Dr Lamonte Sakai in 3 months or sooner if you have any problems.     Called spoke with Robert Church. He states that he was given doxycyline by SG for a sinus infection on 07/01/16 and feels that he could use one more round of it. He c/o sinus drainage, cough with yellow mucus, chest tightness, wheezing, nausea due to the sinus drainage. Denies any SOB, fever or vomiting. Verified pharmacy as [facility]. He needs rx to be sent in before 1pm in order for the pharmacy to deliver it to his work. I explained to him that I would send message to CY in RB absence. He voiced understanding and had no further questions.    CY please advise  Allergies  Allergen Reactions  . E-Mycin [Erythromycin Base] Swelling  . Penicillins Other (See Comments)    Robert Church took as a child and dr told him if he took again it would kill him.    Current Outpatient Prescriptions:  .  albuterol (PROVENTIL HFA;VENTOLIN HFA) 108 (90 BASE) MCG/ACT inhaler, Inhale 2 puffs into the lungs every 6 (six) hours as needed for wheezing., Disp: 1 Inhaler, Rfl: 11 .  albuterol (PROVENTIL) (2.5 MG/3ML) 0.083% nebulizer solution, Take 3 mLs (2.5 mg total) by nebulization every 6 (six) hours as needed for wheezing or shortness of breath., Disp: 75 mL, Rfl: 12 .  ALPRAZolam (XANAX) 1 MG tablet, TAKE 1 TABLET BY MOUTH EVERY NIGHT AT BEDTIME AS NEEDED, Disp: 30 tablet, Rfl: 0 .  Azelastine-Fluticasone 137-50 MCG/ACT SUSP, 1 spray each nostril twice daily, Disp: 23 g, Rfl: 6 .  clotrimazole (MYCELEX) 10 MG troche, Take 1 tablet (10 mg total) by mouth 5 (five) times daily., Disp: 35 tablet, Rfl: 0 .   mometasone-formoterol (DULERA) 200-5 MCG/ACT AERO, Take 2 puffs first thing in am and then another 2 puffs about 12 hours later., Disp: 1 Inhaler, Rfl: 11 .  omeprazole (PRILOSEC) 20 MG capsule, Take 20 mg by mouth daily. ACID REFLUX, Disp: , Rfl:  .  Tiotropium Bromide Monohydrate (SPIRIVA RESPIMAT) 2.5 MCG/ACT AERS, Inhale 2 puffs into the lungs daily., Disp: 1 Inhaler, Rfl: 0 .  valsartan-hydrochlorothiazide (DIOVAN-HCT) 160-12.5 MG per tablet, Take 1 tablet by mouth daily., Disp: 30 tablet, Rfl: 11

## 2016-10-12 ENCOUNTER — Encounter: Payer: Self-pay | Admitting: Emergency Medicine

## 2016-10-12 ENCOUNTER — Ambulatory Visit (INDEPENDENT_AMBULATORY_CARE_PROVIDER_SITE_OTHER): Payer: BLUE CROSS/BLUE SHIELD | Admitting: Emergency Medicine

## 2016-10-12 DIAGNOSIS — F17210 Nicotine dependence, cigarettes, uncomplicated: Secondary | ICD-10-CM | POA: Diagnosis not present

## 2016-10-12 DIAGNOSIS — G4730 Sleep apnea, unspecified: Secondary | ICD-10-CM

## 2016-10-12 DIAGNOSIS — J449 Chronic obstructive pulmonary disease, unspecified: Secondary | ICD-10-CM | POA: Diagnosis not present

## 2016-10-12 NOTE — Progress Notes (Signed)
Subjective:    Patient ID: Robert Church, male    DOB: 11/08/1956, 60 y.o.   MRN: [id number]  HPI Synopsis: Robert Church is a 60yo male with history of COPD, GERD, HTN, and Sinusitis. Has history of heavy tobacco use and longstanding exertional SOB  . Had an acute visit on 07/06/13  and was restarted on breo and spiriva.    ROV 08/13/15 -- follow-up visit for continued tobacco abuse, COPD, allergic rhinitis an chronic cough. He was last seen by me almost a year ago. He has been seen by T Parrett and [name] in the interim for acute exacerbations. He is been followed by [name] with cardiology for chest discomfort and HTN 01/28/15. He states that his breathing has been "terrible". He is having nocturnal awakenings, snoring. His exertional tolerance is worse. He coughs up yellow thick mucous every morning. He is on dulera, spiriva. He uses albuterol neb every morning, sometimes 2x a day. He has cut cigarettes down to 2 a day.  He has chest tightness and some burning. His nebs do not make it better. Fumes make his breathing worse.    ROV 04/14/16 -- patient has a history of tobacco abuse, COPD, allergic rhinitis, chronic cough. Also with a history of hypertension. He has been seen 3 times in this office for acute visits since my last meeting with him. The most recent was in March 2017 at which time he was treated with a prednisone taper.  He was having cough, wheeze, dyspnea - improved but has some of these problems even at baseline. He is still doing some work but is limited. Coughs at various times in the day, clear mucous. He is on Dulera + Spiriva, albuterol prn (neb and HFA). The albuterol helps him some.  He is very limited w regarding to exercise. He wakes up at night, SOB. He is down to 5cig a day. He snores and has witnessed apneas. He inquired today about whether I though he would qualify for disability  ROV 08/13/16 --  As a follow-up visit for severe COPD, continued tobacco use, allergic rhinitis,  chronic cough. He also has untreated obstructive sleep apnea. He experienced an acute exacerbation in July and was treated with antibiotics and prednisone. He was to get PFT today but did not feel he was able. He has a mold / dust exposure about 10 days ago, has caused him to have increased SOB, wheeze, cough, mucous from sinuses and chest. He is using nicotine replacement, smoking a few a day. Using albuterol 3-4x a day. He is on spiriva and dulera, changed from breo.   ROV 10/12/16 -- patient has a history of severe COPD, allergic rhinitis, chronic cough. He also has untreated objective sleep apnea. He tells me that he quit smoking 10 days ago. He feels worse. Feels that he has more bloating, more dyspnea. Seems worse in the am. Remains on spiriva and dulera. Using albuterol about 3-4x a day. Coughing some, clear to yellow. He is on dymista. remains on prilosec 20mg  bid.       Objective:   Physical Exam Vitals:   10/12/16 1539 10/12/16 1540  BP:  (!) 162/92  Pulse:  63  SpO2:  94%  Weight: 222 lb (100.7 kg)   Height: 5\' 10"  (1.778 m)     Gen: well-nourished, in no distress,   ENT: No lesions,  mouth clear, clear nasal drainage, some insp stridor  Neck: No JVD, no TMG, no carotid bruits, he does  some mild stridor  Lungs: No use of accessory muscles, no lower airways wheezing  Cardiovascular: RRR, heart sounds normal, no murmur or gallops, no peripheral edema  Musculoskeletal: No deformities, no cyanosis or clubbing  Neuro: alert, non focal  Skin: Warm, no lesions or rashes   Assessment & Plan:   COPD GOLD C He is very symptomatic. He did stop smoking. He does not have any active wheezing today but he does have upper airway noise and stridor. I've asked him to continue his same inhaled medications for now, stay off cigarettes, and then we will assess for a possible changes bronchodilators next time if he continues to be symptomatic.Marland Kitchen   Sleep apnea Presumed. Never diagnosed  because he wanted to have a home sleep study but his insurance would only pay for a sleep study in the lab. He has not decided to get this done.   Cigarette smoker Congratulated him on cessation.    Baltazar Apo, MD, PhD 10/12/2016, 3:58 PM [facility] [phone] or if no answer [phone]

## 2016-10-12 NOTE — Assessment & Plan Note (Signed)
Congratulated him on cessation.

## 2016-10-12 NOTE — Assessment & Plan Note (Signed)
Presumed. Never diagnosed because he wanted to have a home sleep study but his insurance would only pay for a sleep study in the lab. He has not decided to get this done.

## 2016-10-12 NOTE — Assessment & Plan Note (Signed)
He is very symptomatic. He did stop smoking. He does not have any active wheezing today but he does have upper airway noise and stridor. I've asked him to continue his same inhaled medications for now, stay off cigarettes, and then we will assess for a possible changes bronchodilators next time if he continues to be symptomatic.Marland Kitchen

## 2016-10-12 NOTE — Patient Instructions (Addendum)
Please continue your dymista as you are taking it.  Continue your spiriva 2 puffs daily Continue your dulera 2 puffs twice a day.  Congratulations on stopping smoking.  We discussed getting a sleep study - please think about this. If you decide to pursue it we will arrange.  Follow with Dr Lamonte Sakai in 3 months or sooner if you have any problems.

## 2016-11-02 ENCOUNTER — Telehealth: Payer: Self-pay | Admitting: Emergency Medicine

## 2016-11-02 NOTE — Telephone Encounter (Signed)
lmtcb for pt.  

## 2016-11-05 MED ORDER — DOXYCYCLINE HYCLATE 100 MG PO TABS: 100.0000 mg | ORAL_TABLET | Freq: Two times a day (BID) | ORAL | 0 refills | Status: DC

## 2016-11-05 NOTE — Telephone Encounter (Signed)
Spoke with patient-coughing up yellow x 1 week, wheezing. Recently had sinus infection and then moved down to his chest. Pt would like refill of Doxycycline that he was given back in 08-2016.    RB please advise. Thanks.

## 2016-11-05 NOTE — Telephone Encounter (Signed)
CY please advise. Message was not addressed by RB

## 2016-11-05 NOTE — Telephone Encounter (Signed)
Per CY-okay to give Doxycycline as before. Thanks.

## 2016-11-05 NOTE — Telephone Encounter (Signed)
Pt is aware of approval for Doxycycline Rx and has been sent to [facility]. Nothing more needed at this time.

## 2016-11-19 ENCOUNTER — Ambulatory Visit: Payer: Self-pay | Admitting: Emergency Medicine

## 2016-12-04 ENCOUNTER — Ambulatory Visit: Payer: Self-pay | Admitting: Emergency Medicine

## 2016-12-23 ENCOUNTER — Telehealth: Payer: Self-pay | Admitting: Adult Health

## 2016-12-23 ENCOUNTER — Encounter: Payer: Self-pay | Admitting: Adult Health

## 2016-12-23 ENCOUNTER — Ambulatory Visit (INDEPENDENT_AMBULATORY_CARE_PROVIDER_SITE_OTHER): Payer: BLUE CROSS/BLUE SHIELD | Admitting: Adult Health

## 2016-12-23 ENCOUNTER — Ambulatory Visit (INDEPENDENT_AMBULATORY_CARE_PROVIDER_SITE_OTHER)
Admission: RE | Admit: 2016-12-23 | Discharge: 2016-12-23 | Disposition: A | Discharge status: Home / Self Care | Payer: BLUE CROSS/BLUE SHIELD | Source: Ambulatory Visit | Attending: Adult Health | Admitting: Adult Health

## 2016-12-23 VITALS — BP 132/90 | HR 86 | Temp 97.6°F | Ht 70.0 in | Wt 216.4 lb

## 2016-12-23 DIAGNOSIS — J302 Other seasonal allergic rhinitis: Secondary | ICD-10-CM | POA: Diagnosis not present

## 2016-12-23 DIAGNOSIS — J449 Chronic obstructive pulmonary disease, unspecified: Secondary | ICD-10-CM | POA: Diagnosis not present

## 2016-12-23 MED ORDER — PREDNISONE 10 MG PO TABS: ORAL_TABLET | ORAL | 0 refills | Status: DC

## 2016-12-23 MED ORDER — LEVOFLOXACIN 500 MG PO TABS: 500.0000 mg | ORAL_TABLET | Freq: Every day | ORAL | 0 refills | Status: AC

## 2016-12-23 NOTE — Progress Notes (Signed)
@Patient  ID: Robert Church, male    DOB: 29-Apr-1956, 61 y.o.   MRN: [id number]  Chief Complaint  Patient presents with  . Acute Visit    cough     Referring provider: Darlyne Russian, MD  HPI: 61 yo former smoker followed for severe COPD , GERD , HTN , Sinusitis   12/23/2016 Acute OV  Pt presents for 2 weeks of cough , congestion with thick green mucus and sinus congestion . Called in Doxycycline last week, finished 3 days ago. Did not feel it helped at all. Says he fell 3 weeks ago, tripped in house and has rib fracture. [facility] with [facility] confirming fx.  Cough and wheezing worse for last few days. No fever, chest pain, orthopnea, edema .  Taking otc cold meds without much help.  Remains on dulera and spiriva .  Not smoking for few months now.    Allergies  Allergen Reactions  . E-Mycin [Erythromycin Base] Swelling  . Penicillins Other (See Comments)    Pt took as a child and dr told him if he took again it would kill him.    Immunization History  Administered Date(s) Administered  . Tdap 12/03/2011    Past Medical History:  Diagnosis Date  . Allergy   . Asthma   . Bronchitis   . COPD (chronic obstructive pulmonary disease) ([place])   . GERD (gastroesophageal reflux disease)   . Hypertension   . Pneumonia   . Sinusitis   . SOB (shortness of breath)     Tobacco History: History  Smoking Status  . Former Smoker  . Packs/day: 0.25  . Years: 46.00  . Types: Cigarettes  . Quit date: 10/05/2016  Smokeless Tobacco  . Never Used    Comment: 5 cigs qd (07/01/16)   Counseling given: Not Answered   Outpatient Encounter Prescriptions as of 12/23/2016  Medication Sig  . albuterol (PROVENTIL HFA;VENTOLIN HFA) 108 (90 BASE) MCG/ACT inhaler Inhale 2 puffs into the lungs every 6 (six) hours as needed for wheezing.  Marland Kitchen albuterol (PROVENTIL) (2.5 MG/3ML) 0.083% nebulizer solution Take 3 mLs (2.5 mg total) by nebulization every 6 (six) hours as needed for wheezing  or shortness of breath.  . ALPRAZolam (XANAX) 1 MG tablet TAKE 1 TABLET BY MOUTH EVERY NIGHT AT BEDTIME AS NEEDED  . Azelastine-Fluticasone 137-50 MCG/ACT SUSP 1 spray each nostril twice daily  . mometasone-formoterol (DULERA) 200-5 MCG/ACT AERO Take 2 puffs first thing in am and then another 2 puffs about 12 hours later.  Marland Kitchen omeprazole (PRILOSEC) 20 MG capsule Take 20 mg by mouth daily. ACID REFLUX  . Tiotropium Bromide Monohydrate (SPIRIVA RESPIMAT) 2.5 MCG/ACT AERS Inhale 2 puffs into the lungs daily.  . valsartan-hydrochlorothiazide (DIOVAN-HCT) 160-12.5 MG per tablet Take 1 tablet by mouth daily.  . clotrimazole (MYCELEX) 10 MG troche Take 1 tablet (10 mg total) by mouth 5 (five) times daily. (Patient not taking: Reported on 12/23/2016)  . doxycycline (VIBRA-TABS) 100 MG tablet Take 1 tablet (100 mg total) by mouth 2 (two) times daily. (Patient not taking: Reported on 12/23/2016)  . levofloxacin (LEVAQUIN) 500 MG tablet Take 1 tablet (500 mg total) by mouth daily.  . predniSONE (DELTASONE) 10 MG tablet 4 tabs for 2 days, then 3 tabs for 2 days, 2 tabs for 2 days, then 1 tab for 2 days, then stop   No facility-administered encounter medications on file as of 12/23/2016.      Review of Systems  Constitutional:   No  weight loss, night sweats,  Fevers, chills,  +fatigue, or  lassitude.  HEENT:   No headaches,  Difficulty swallowing,  Tooth/dental problems, or  Sore throat,                No sneezing, itching, ear ache,  +nasal congestion, post nasal drip,   CV:  No chest pain,  Orthopnea, PND, swelling in lower extremities, anasarca, dizziness, palpitations, syncope.   GI  No heartburn, indigestion, abdominal pain, nausea, vomiting, diarrhea, change in bowel habits, loss of appetite, bloody stools.   Resp:   No chest wall deformity  Skin: no rash or lesions.  GU: no dysuria, change in color of urine, no urgency or frequency.  No flank pain, no hematuria   MS:  No joint pain or swelling.   No decreased range of motion.  No back pain.    Physical Exam  BP 132/90 (BP Location: Right Arm, Patient Position: Sitting, Cuff Size: Normal)   Pulse 86   Temp 97.6 F (36.4 C)   Ht 5\' 10"  (1.778 m)   Wt 216 lb 6.4 oz (98.2 kg)   SpO2 94%   BMI 31.05 kg/m   GEN: A/Ox3; pleasant , NAD     HEENT:  [place]/AT,  EACs-clear, TMs-wnl, NOSE-clear drainage , THROAT-clear, no lesions, no postnasal drip or exudate noted.   NECK:  Supple w/ fair ROM; no JVD; normal carotid impulses w/o bruits; no thyromegaly or nodules palpated; no lymphadenopathy.    RESP  Few rhonchi noted.  no accessory muscle use, no dullness to percussion  CARD:  RRR, no m/r/g, no peripheral edema, pulses intact, no cyanosis or clubbing.  GI:   Soft & nt; nml bowel sounds; no organomegaly or masses detected.   Musco: Warm bil, no deformities or joint swelling noted.   Neuro: alert, no focal deficits noted.    Skin: Warm, no lesions or rashes  Psych:  No change in mood or affect. No depression or anxiety.  No memory loss.  Lab Results:  CBC    Component Value Date/Time   WBC 8.4 02/26/2016 2347   RBC 4.95 02/26/2016 2347   HGB 16.9 02/26/2016 2347   HCT 47.9 02/26/2016 2347   PLT 154 02/26/2016 2347   MCV 96.8 02/26/2016 2347   MCV 101.1 (A) 05/16/2013 0954   MCH 34.1 (H) 02/26/2016 2347   MCHC 35.3 02/26/2016 2347   RDW 12.5 02/26/2016 2347   LYMPHSABS 0.6 (L) 02/26/2016 2347   MONOABS 0.8 02/26/2016 2347   EOSABS 0.0 02/26/2016 2347   BASOSABS 0.0 02/26/2016 2347    BMET    Component Value Date/Time   NA 141 04/30/2016 1029   K 3.8 04/30/2016 1029   CL 103 04/30/2016 1029   CO2 28 04/30/2016 1029   GLUCOSE 120 (H) 04/30/2016 1029   BUN 17 04/30/2016 1029   CREATININE 0.85 04/30/2016 1029   CALCIUM 8.7 04/30/2016 1029   GFRNONAA >60 02/26/2016 2347   GFRAA >60 02/26/2016 2347    BNP No results found for: BNP  ProBNP No results found for: PROBNP  Imaging: No results  found.   Assessment & Plan:   COPD GOLD C Flare with +/- sinusitis  Check cxr   Plan  Patient Instructions  Levaquin 500mg  daily for 1 week . ,take with food.  Mucinex DM Twice daily  As needed  Cough/congestion .  Prednisone taper over next week.  Chest xray today  Please contact office for  sooner follow up if symptoms do not improve or worsen or seek emergency care  follow up [name]  In 2-3 months and As needed      Seasonal allergic rhinitis AR/early sinusitis   Plan  Patient Instructions  Levaquin 500mg  daily for 1 week . ,take with food.  Mucinex DM Twice daily  As needed  Cough/congestion .  Prednisone taper over next week.  Chest xray today  Please contact office for sooner follow up if symptoms do not improve or worsen or seek emergency care  follow up [name]  In 2-3 months and As needed         Rexene Edison, NP 12/23/2016

## 2016-12-23 NOTE — Assessment & Plan Note (Signed)
Flare with +/- sinusitis  Check cxr   Plan  Patient Instructions  Levaquin 500mg  daily for 1 week . ,take with food.  Mucinex DM Twice daily  As needed  Cough/congestion .  Prednisone taper over next week.  Chest xray today  Please contact office for sooner follow up if symptoms do not improve or worsen or seek emergency care  follow up [name]  In 2-3 months and As needed

## 2016-12-23 NOTE — Assessment & Plan Note (Signed)
AR/early sinusitis   Plan  Patient Instructions  Levaquin 500mg  daily for 1 week . ,take with food.  Mucinex DM Twice daily  As needed  Cough/congestion .  Prednisone taper over next week.  Chest xray today  Please contact office for sooner follow up if symptoms do not improve or worsen or seek emergency care  follow up [name]  In 2-3 months and As needed

## 2016-12-23 NOTE — Patient Instructions (Signed)
Levaquin 500mg  daily for 1 week . ,take with food.  Mucinex DM Twice daily  As needed  Cough/congestion .  Prednisone taper over next week.  Chest xray today  Please contact office for sooner follow up if symptoms do not improve or worsen or seek emergency care  follow up [name]  In 2-3 months and As needed

## 2016-12-23 NOTE — Telephone Encounter (Signed)
Notes Recorded by Melvenia Needles, NP on 12/23/2016 at 1:06 PM EST No sign of PNA  Cont w/ ov recs Please contact office for sooner follow up if symptoms do not improve or worsen or seek emergency care ---------------------------------------------- Spoke with pt. He is aware of results. Nothing further was needed.

## 2017-02-01 DIAGNOSIS — J40 Bronchitis, not specified as acute or chronic: Secondary | ICD-10-CM | POA: Insufficient documentation

## 2017-02-19 ENCOUNTER — Ambulatory Visit: Payer: Self-pay | Admitting: Emergency Medicine

## 2017-04-06 ENCOUNTER — Other Ambulatory Visit: Payer: Self-pay | Admitting: Family Medicine

## 2017-04-06 DIAGNOSIS — M79669 Pain in unspecified lower leg: Secondary | ICD-10-CM

## 2017-04-06 DIAGNOSIS — M7989 Other specified soft tissue disorders: Principal | ICD-10-CM

## 2017-04-06 DIAGNOSIS — R292 Abnormal reflex: Secondary | ICD-10-CM

## 2017-04-12 NOTE — Progress Notes (Signed)
Patient ID: Robert Church, male   DOB: 12-01-1956, 61 y.o.   MRN: [id number]     60 y.o.  smoker referred for chest pain by Shelda Jakes NP [facility] .  Seen in 2012 chest pain.  Myovue low risk but not totally normal  Overall Impression: Abnormal stress nuclear study. 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 However, the LV function is normal and the apex contracts normally which suggests that this is not an infarct. I cannot exclude the possibility of apical thinning to explain the defect since the data are somewhat conflicting. Echo:  EF 50-55%  Mild LAE  Seen again 6/15 Chest pain the last few months  With fatigue and dyspnea.  Not always exertional Radiates to back on occasion.  Not positional not pleuritic  No relief with antacids  Lasts a few minutes.  Was given amlodipine for HTN a year ago and has had LE edema ever since  Wants a different BP med   Unable to walk on treadmill due to bilateral knee pain  Should have TKR but is scared to do this  Cardiac CT ordered but never done  Cozaar not effective for BP  Amlodipine caused  worse LE edema  On diovan with HCTZ  04/2016 started on lipitor and tricor for elevated lipids and triglycerides  Reviewed Cardiac CT done 05/07/16 Calcium score 102 68th percentile Less than 50% midxed plaque in proximal and mid LAD  Had claustrophobia and elevated HR during scan despite 15 mg lopressor   Still with multiple somatic complaints Has exertional SSCP every day. Not taking nitrol  Not taking ASA. Dyspnea but still smoking. Fatigue Says he is compliant with meds But BP high today Cannot exercise due to bilateral knee arthritis but has not seen ortho in  A long time Still working at [employer]   ROS: Denies fever, malais, weight loss, blurry vision, decreased visual acuity, cough, sputum, SOB, hemoptysis, pleuritic pain, palpitaitons, heartburn, abdominal pain, melena, lower extremity edema, claudication,  or rash.  All other systems reviewed and negative   General: Affect appropriate Depressed white male  HEENT: normal Neck supple with no adenopathy JVP normal no bruits no thyromegaly Lungs clear with no wheezing and good diaphragmatic motion Heart:  S1/S2 no murmur,rub, gallop or click PMI normal Abdomen: benighn, BS positve, no tenderness, no AAA no bruit.  No HSM or HJR Distal pulses intact with no bruits Plus one bilateral LE edema Neuro non-focal Skin warm and dry No muscular weakness  Medications Current Outpatient Prescriptions  Medication Sig Dispense Refill  . albuterol (PROVENTIL HFA;VENTOLIN HFA) 108 (90 BASE) MCG/ACT inhaler Inhale 2 puffs into the lungs every 6 (six) hours as needed for wheezing. 1 Inhaler 11  . albuterol (PROVENTIL) (2.5 MG/3ML) 0.083% nebulizer solution Take 3 mLs (2.5 mg total) by nebulization every 6 (six) hours as needed for wheezing or shortness of breath. 75 mL 12  . ALPRAZolam (XANAX) 1 MG tablet TAKE 1 TABLET BY MOUTH EVERY NIGHT AT BEDTIME AS NEEDED 30 tablet 0  . Azelastine-Fluticasone 137-50 MCG/ACT SUSP 1 spray each nostril twice daily 23 g 6  . mometasone-formoterol (DULERA) 200-5 MCG/ACT AERO Take 2 puffs first thing in am and then another 2 puffs about 12 hours later. 1 Inhaler 11  . omeprazole (PRILOSEC) 20 MG capsule Take 20 mg by mouth daily. ACID REFLUX    . tiotropium (SPIRIVA) 18 MCG inhalation capsule Place 18 mcg into inhaler and  inhale daily.    . valsartan-hydrochlorothiazide (DIOVAN-HCT) 160-12.5 MG per tablet Take 1 tablet by mouth daily. 30 tablet 11   No current facility-administered medications for this visit.     Allergies E-mycin [erythromycin base] and Penicillins  Family History: Family History  Problem Relation Age of Onset  . Lung cancer Father   . Brain cancer Father     Social History: Social History   Social History  . Marital status: Single    Spouse name: N/A  . Number of children: N/A  . Years  of education: N/A   Occupational History  . Not on file.   Social History Main Topics  . Smoking status: Former Smoker    Packs/day: 0.25    Years: 46.00    Types: Cigarettes    Quit date: 10/05/2016  . Smokeless tobacco: Never Used     Comment: 5 cigs qd (07/01/16)  . Alcohol use No  . Drug use: No  . Sexual activity: Yes    Birth control/ protection: None   Other Topics Concern  . Not on file   Social History Narrative  . No narrative on file    Electrocardiogram:  5/15  SR RBBB nonspecific ST/T wave changes 04/15/17  SR rate 81 RBBB LPFB   Assessment and Plan Chest Pain: recurrent unable to walk previously normal myovue 2012 felt horrible with lexiscan  CTA no hemodynamically significant stenosis 05/07/16  Given continued chest pains will arrange Outpatient cath. Risks including stroke, MI , contrast reaction bleeding discussed Willing to proceed Orders written Scheduled with Dr Tamala Julian on Monday Will need labs and CXR today   HTN: Poorly controlled add norvasc 10 mg to ARB/diuretic .   COPD  Decrease in PFTls needs to stop smoking conitnue inhalers f/u pulmonary at [facility]  GERD low carb diet weight loss and prilosec RBBB: chronic yearly ECG no high grade heart block   Ortho:  Encouraged him to see ortho and get xray's of his knees.   Jenkins Rouge, MD

## 2017-04-15 ENCOUNTER — Ambulatory Visit
Admission: RE | Admit: 2017-04-15 | Discharge: 2017-04-15 | Disposition: A | Discharge status: Home / Self Care | Payer: BLUE CROSS/BLUE SHIELD | Source: Ambulatory Visit | Attending: Cardiovascular Disease | Admitting: Cardiovascular Disease

## 2017-04-15 ENCOUNTER — Ambulatory Visit (INDEPENDENT_AMBULATORY_CARE_PROVIDER_SITE_OTHER): Payer: BLUE CROSS/BLUE SHIELD | Admitting: Cardiovascular Disease

## 2017-04-15 ENCOUNTER — Encounter: Payer: Self-pay | Admitting: Cardiovascular Disease

## 2017-04-15 ENCOUNTER — Encounter (INDEPENDENT_AMBULATORY_CARE_PROVIDER_SITE_OTHER): Payer: Self-pay

## 2017-04-15 VITALS — BP 170/108 | HR 80 | Ht 70.0 in | Wt 218.0 lb

## 2017-04-15 DIAGNOSIS — Z79899 Other long term (current) drug therapy: Secondary | ICD-10-CM | POA: Diagnosis not present

## 2017-04-15 DIAGNOSIS — I10 Essential (primary) hypertension: Secondary | ICD-10-CM

## 2017-04-15 DIAGNOSIS — Z01812 Encounter for preprocedural laboratory examination: Secondary | ICD-10-CM

## 2017-04-15 MED ORDER — AMLODIPINE BESYLATE 10 MG PO TABS: 10.0000 mg | ORAL_TABLET | Freq: Every day | ORAL | 3 refills | Status: DC

## 2017-04-15 MED ORDER — ASPIRIN EC 81 MG PO TBEC: 81.0000 mg | DELAYED_RELEASE_TABLET | Freq: Every day | ORAL | 3 refills | Status: DC

## 2017-04-15 NOTE — Patient Instructions (Addendum)
Medication Instructions:  Your physician has recommended you make the following change in your medication:  1-START Norvasc 10 mg by mouth daily. 2-START Aspirin 81 mg by mouth daily  Labwork: Your physician recommends that you have lab work- BMET, CBC, PT/INR  Testing/Procedures: A chest x-ray takes a picture of the organs and structures inside the chest, including the heart, lungs, and blood vessels. This test can show several things, including, whether the heart is enlarges; whether fluid is building up in the lungs; and whether pacemaker / defibrillator leads are still in place.  Follow-Up: Your physician wants you to follow-up in: 3 weeks with Dr. Johnsie Cancel.   If you need a refill on your cardiac medications before your next appointment, please call your pharmacy.     [facility] [address], [place] [address] [place] [address] Dept: [phone] Loc: [facility]  04/15/2017  You are scheduled for a Cardiac Catheterization on Monday, April 30 with Dr. Daneen Schick.  1. Please arrive at the [facility] (Main Entrance A) at [facility]: [address] [place], [place] [address] at 5:30 AM (two hours before your procedure to ensure your preparation). Free valet parking service is available.   Special note: Every effort is made to have your procedure done on time. Please understand that emergencies sometimes delay scheduled procedures.  2. Diet: Do not eat or drink anything after midnight prior to your procedure except sips of water to take medications.  3. Labs: None needed.  4. Medication instructions in preparation for your procedure  Stop taking, Diovan (Valsartan) on Monday, April 30.    On the morning of your procedure, take your Aspirin and any morning medicines NOT listed above.  You may use sips of water.  5. Plan for one night stay--bring personal  belongings. 6. Bring a current list of your medications and current insurance cards. 7. You MUST have a responsible person to drive you home. 8. Someone MUST be with you the first 24 hours after you arrive home or your discharge will be delayed. 9. Please wear clothes that are easy to get on and off and wear slip-on shoes.  Thank you for allowing [place] to care for you!   -- [facility]

## 2017-04-16 LAB — PROTIME-INR
INR: 1 (ref 0.8–1.2)
Prothrombin Time: 10.3 s (ref 9.1–12.0)

## 2017-04-16 LAB — CBC WITH DIFFERENTIAL/PLATELET
Basophils Absolute: 0 10*3/uL (ref 0.0–0.2)
Basos: 0 %
EOS (ABSOLUTE): 0 10*3/uL (ref 0.0–0.4)
Eos: 1 %
Hematocrit: 51.7 % — ABNORMAL HIGH (ref 37.5–51.0)
Hemoglobin: 18.2 g/dL — ABNORMAL HIGH (ref 13.0–17.7)
Immature Grans (Abs): 0 10*3/uL (ref 0.0–0.1)
Immature Granulocytes: 0 %
Lymphocytes Absolute: 1.3 10*3/uL (ref 0.7–3.1)
Lymphs: 19 %
MCH: 34.7 pg — ABNORMAL HIGH (ref 26.6–33.0)
MCHC: 35.2 g/dL (ref 31.5–35.7)
MCV: 99 fL — ABNORMAL HIGH (ref 79–97)
Monocytes Absolute: 0.4 10*3/uL (ref 0.1–0.9)
Monocytes: 6 %
Neutrophils Absolute: 5.2 10*3/uL (ref 1.4–7.0)
Neutrophils: 74 %
Platelets: 218 10*3/uL (ref 150–379)
RBC: 5.24 x10E6/uL (ref 4.14–5.80)
RDW: 12.4 % (ref 12.3–15.4)
WBC: 6.9 10*3/uL (ref 3.4–10.8)

## 2017-04-16 LAB — BASIC METABOLIC PANEL
BUN/Creatinine Ratio: 16 (ref 9–20)
BUN: 15 mg/dL (ref 6–24)
CO2: 26 mmol/L (ref 18–29)
Calcium: 9.2 mg/dL (ref 8.7–10.2)
Chloride: 98 mmol/L (ref 96–106)
Creatinine, Ser: 0.95 mg/dL (ref 0.76–1.27)
GFR calc Af Amer: 102 mL/min/{1.73_m2} (ref >59)
GFR calc non Af Amer: 88 mL/min/{1.73_m2} (ref >59)
Glucose: 98 mg/dL (ref 65–99)
Potassium: 3.8 mmol/L (ref 3.5–5.2)
Sodium: 143 mmol/L (ref 134–144)

## 2017-04-18 ENCOUNTER — Other Ambulatory Visit: Payer: Self-pay | Admitting: Cardiovascular Disease

## 2017-04-18 ENCOUNTER — Other Ambulatory Visit: Payer: BLUE CROSS/BLUE SHIELD

## 2017-04-19 ENCOUNTER — Encounter (HOSPITAL_COMMUNITY): Payer: Self-pay | Admitting: Interventional Cardiology

## 2017-04-19 ENCOUNTER — Encounter (HOSPITAL_COMMUNITY)
Admission: RE | Disposition: A | Discharge status: Home / Self Care | Payer: Self-pay | Source: Ambulatory Visit | Attending: Interventional Cardiology

## 2017-04-19 ENCOUNTER — Ambulatory Visit (HOSPITAL_COMMUNITY)
Admission: RE | Admit: 2017-04-19 | Discharge: 2017-04-19 | Disposition: A | Discharge status: Home / Self Care | Payer: BLUE CROSS/BLUE SHIELD | Source: Ambulatory Visit | Attending: Interventional Cardiology | Admitting: Interventional Cardiology

## 2017-04-19 DIAGNOSIS — R072 Precordial pain: Secondary | ICD-10-CM

## 2017-04-19 DIAGNOSIS — M170 Bilateral primary osteoarthritis of knee: Secondary | ICD-10-CM | POA: Diagnosis not present

## 2017-04-19 DIAGNOSIS — I10 Essential (primary) hypertension: Secondary | ICD-10-CM | POA: Diagnosis not present

## 2017-04-19 DIAGNOSIS — J449 Chronic obstructive pulmonary disease, unspecified: Secondary | ICD-10-CM | POA: Diagnosis not present

## 2017-04-19 DIAGNOSIS — K219 Gastro-esophageal reflux disease without esophagitis: Secondary | ICD-10-CM | POA: Diagnosis not present

## 2017-04-19 DIAGNOSIS — Z87891 Personal history of nicotine dependence: Secondary | ICD-10-CM | POA: Insufficient documentation

## 2017-04-19 DIAGNOSIS — Z880 Allergy status to penicillin: Secondary | ICD-10-CM | POA: Insufficient documentation

## 2017-04-19 DIAGNOSIS — R600 Localized edema: Secondary | ICD-10-CM | POA: Insufficient documentation

## 2017-04-19 DIAGNOSIS — I4510 Unspecified right bundle-branch block: Secondary | ICD-10-CM | POA: Insufficient documentation

## 2017-04-19 DIAGNOSIS — R0600 Dyspnea, unspecified: Secondary | ICD-10-CM | POA: Diagnosis present

## 2017-04-19 DIAGNOSIS — R9439 Abnormal result of other cardiovascular function study: Secondary | ICD-10-CM | POA: Diagnosis not present

## 2017-04-19 DIAGNOSIS — I2510 Atherosclerotic heart disease of native coronary artery without angina pectoris: Secondary | ICD-10-CM

## 2017-04-19 DIAGNOSIS — R0789 Other chest pain: Secondary | ICD-10-CM | POA: Insufficient documentation

## 2017-04-19 DIAGNOSIS — R079 Chest pain, unspecified: Secondary | ICD-10-CM | POA: Diagnosis present

## 2017-04-19 HISTORY — PX: LEFT HEART CATH AND CORONARY ANGIOGRAPHY: CATH118249

## 2017-04-19 SURGERY — LEFT HEART CATH AND CORONARY ANGIOGRAPHY
Anesthesia: LOCAL

## 2017-04-19 MED ORDER — HEPARIN (PORCINE) IN NACL 2-0.9 UNIT/ML-% IJ SOLN
INTRAMUSCULAR | Status: AC
  Filled 2017-04-19: qty 1000

## 2017-04-19 MED ORDER — LIDOCAINE HCL (PF) 1 % IJ SOLN
INTRAMUSCULAR | Status: DC | PRN
  Administered 2017-04-19: 2 mL

## 2017-04-19 MED ORDER — SODIUM CHLORIDE 0.9% FLUSH: 3.0000 mL | INTRAVENOUS | Status: DC | PRN

## 2017-04-19 MED ORDER — HEPARIN (PORCINE) IN NACL 2-0.9 UNIT/ML-% IJ SOLN
INTRAMUSCULAR | Status: DC | PRN
  Administered 2017-04-19: 1000 mL

## 2017-04-19 MED ORDER — SODIUM CHLORIDE 0.9% FLUSH: 3.0000 mL | Freq: Two times a day (BID) | INTRAVENOUS | Status: DC

## 2017-04-19 MED ORDER — ASPIRIN 81 MG PO CHEW: 81.0000 mg | CHEWABLE_TABLET | ORAL | Status: DC

## 2017-04-19 MED ORDER — MIDAZOLAM HCL 2 MG/2ML IJ SOLN
INTRAMUSCULAR | Status: DC | PRN
  Administered 2017-04-19: 1 mg via INTRAVENOUS

## 2017-04-19 MED ORDER — MIDAZOLAM HCL 2 MG/2ML IJ SOLN
INTRAMUSCULAR | Status: AC
  Filled 2017-04-19: qty 2

## 2017-04-19 MED ORDER — SODIUM CHLORIDE 0.9 % IV SOLN: 250.0000 mL | INTRAVENOUS | Status: DC | PRN

## 2017-04-19 MED ORDER — IOPAMIDOL (ISOVUE-370) INJECTION 76%
INTRAVENOUS | Status: AC
  Filled 2017-04-19: qty 100

## 2017-04-19 MED ORDER — SODIUM CHLORIDE 0.9 % WEIGHT BASED INFUSION
3.0000 mL/kg/h | INTRAVENOUS | Status: AC
  Administered 2017-04-19: 3 mL/kg/h via INTRAVENOUS

## 2017-04-19 MED ORDER — LIDOCAINE HCL 1 % IJ SOLN
INTRAMUSCULAR | Status: AC
  Filled 2017-04-19: qty 20

## 2017-04-19 MED ORDER — VERAPAMIL HCL 2.5 MG/ML IV SOLN
INTRAVENOUS | Status: DC | PRN
  Administered 2017-04-19: 10 mL via INTRA_ARTERIAL

## 2017-04-19 MED ORDER — ACETAMINOPHEN 325 MG PO TABS: 650.0000 mg | ORAL_TABLET | ORAL | Status: DC | PRN

## 2017-04-19 MED ORDER — VERAPAMIL HCL 2.5 MG/ML IV SOLN
INTRAVENOUS | Status: AC
  Filled 2017-04-19: qty 2

## 2017-04-19 MED ORDER — ONDANSETRON HCL 4 MG/2ML IJ SOLN: 4.0000 mg | Freq: Four times a day (QID) | INTRAMUSCULAR | Status: DC | PRN

## 2017-04-19 MED ORDER — SODIUM CHLORIDE 0.9 % WEIGHT BASED INFUSION: 1.0000 mL/kg/h | INTRAVENOUS | Status: DC

## 2017-04-19 MED ORDER — FENTANYL CITRATE (PF) 100 MCG/2ML IJ SOLN
INTRAMUSCULAR | Status: DC | PRN
  Administered 2017-04-19: 50 ug via INTRAVENOUS

## 2017-04-19 MED ORDER — IOPAMIDOL (ISOVUE-370) INJECTION 76%
INTRAVENOUS | Status: DC | PRN
  Administered 2017-04-19: 40 mL via INTRAVENOUS

## 2017-04-19 MED ORDER — FENTANYL CITRATE (PF) 100 MCG/2ML IJ SOLN
INTRAMUSCULAR | Status: AC
  Filled 2017-04-19: qty 2

## 2017-04-19 MED ORDER — HEPARIN SODIUM (PORCINE) 1000 UNIT/ML IJ SOLN
INTRAMUSCULAR | Status: DC | PRN
  Administered 2017-04-19: 5000 [IU] via INTRAVENOUS

## 2017-04-19 MED ORDER — HEPARIN SODIUM (PORCINE) 1000 UNIT/ML IJ SOLN
INTRAMUSCULAR | Status: AC
  Filled 2017-04-19: qty 1

## 2017-04-19 MED ORDER — OXYCODONE-ACETAMINOPHEN 5-325 MG PO TABS: 1.0000 | ORAL_TABLET | ORAL | Status: DC | PRN

## 2017-04-19 SURGICAL SUPPLY — 10 items
CATH EXPO 5F FL3.5 (CATHETERS) ×1 IMPLANT
CATH INFINITI JR4 5F (CATHETERS) ×1 IMPLANT
DEVICE RAD COMP TR BAND LRG (VASCULAR PRODUCTS) ×1 IMPLANT
GLIDESHEATH SLEND A-KIT 6F 22G (SHEATH) ×1 IMPLANT
GUIDEWIRE INQWIRE 1.5J.035X260 (WIRE) IMPLANT
INQWIRE 1.5J .035X260CM (WIRE) ×2
KIT HEART LEFT (KITS) ×2 IMPLANT
PACK CARDIAC CATHETERIZATION (CUSTOM PROCEDURE TRAY) ×2 IMPLANT
TRANSDUCER W/STOPCOCK (MISCELLANEOUS) ×2 IMPLANT
TUBING CIL FLEX 10 FLL-RA (TUBING) ×2 IMPLANT

## 2017-04-19 NOTE — H&P (View-Only) (Signed)
Patient ID: Robert Church, male   DOB: 1956/05/30, 61 y.o.   MRN: [id number]     60 y.o.  smoker referred for chest pain by Shelda Jakes NP [facility] .  Seen in 2012 chest pain.  Myovue low risk but not totally normal  Overall Impression: Abnormal stress nuclear study. 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 However, the LV function is normal and the apex contracts normally which suggests that this is not an infarct. I cannot exclude the possibility of apical thinning to explain the defect since the data are somewhat conflicting. Echo:  EF 50-55%  Mild LAE  Seen again 6/15 Chest pain the last few months  With fatigue and dyspnea.  Not always exertional Radiates to back on occasion.  Not positional not pleuritic  No relief with antacids  Lasts a few minutes.  Was given amlodipine for HTN a year ago and has had LE edema ever since  Wants a different BP med   Unable to walk on treadmill due to bilateral knee pain  Should have TKR but is scared to do this  Cardiac CT ordered but never done  Cozaar not effective for BP  Amlodipine caused  worse LE edema  On diovan with HCTZ  04/2016 started on lipitor and tricor for elevated lipids and triglycerides  Reviewed Cardiac CT done 05/07/16 Calcium score 102 68th percentile Less than 50% midxed plaque in proximal and mid LAD  Had claustrophobia and elevated HR during scan despite 15 mg lopressor   Still with multiple somatic complaints Has exertional SSCP every day. Not taking nitrol  Not taking ASA. Dyspnea but still smoking. Fatigue Says he is compliant with meds But BP high today Cannot exercise due to bilateral knee arthritis but has not seen ortho in  A long time Still working at [employer]   ROS: Denies fever, malais, weight loss, blurry vision, decreased visual acuity, cough, sputum, SOB, hemoptysis, pleuritic pain, palpitaitons, heartburn, abdominal pain, melena, lower extremity edema, claudication,  or rash.  All other systems reviewed and negative   General: Affect appropriate Depressed white male  HEENT: normal Neck supple with no adenopathy JVP normal no bruits no thyromegaly Lungs clear with no wheezing and good diaphragmatic motion Heart:  S1/S2 no murmur,rub, gallop or click PMI normal Abdomen: benighn, BS positve, no tenderness, no AAA no bruit.  No HSM or HJR Distal pulses intact with no bruits Plus one bilateral LE edema Neuro non-focal Skin warm and dry No muscular weakness  Medications Current Outpatient Prescriptions  Medication Sig Dispense Refill  . albuterol (PROVENTIL HFA;VENTOLIN HFA) 108 (90 BASE) MCG/ACT inhaler Inhale 2 puffs into the lungs every 6 (six) hours as needed for wheezing. 1 Inhaler 11  . albuterol (PROVENTIL) (2.5 MG/3ML) 0.083% nebulizer solution Take 3 mLs (2.5 mg total) by nebulization every 6 (six) hours as needed for wheezing or shortness of breath. 75 mL 12  . ALPRAZolam (XANAX) 1 MG tablet TAKE 1 TABLET BY MOUTH EVERY NIGHT AT BEDTIME AS NEEDED 30 tablet 0  . Azelastine-Fluticasone 137-50 MCG/ACT SUSP 1 spray each nostril twice daily 23 g 6  . mometasone-formoterol (DULERA) 200-5 MCG/ACT AERO Take 2 puffs first thing in am and then another 2 puffs about 12 hours later. 1 Inhaler 11  . omeprazole (PRILOSEC) 20 MG capsule Take 20 mg by mouth daily. ACID REFLUX    . tiotropium (SPIRIVA) 18 MCG inhalation capsule Place 18 mcg into inhaler and  inhale daily.    . valsartan-hydrochlorothiazide (DIOVAN-HCT) 160-12.5 MG per tablet Take 1 tablet by mouth daily. 30 tablet 11   No current facility-administered medications for this visit.     Allergies E-mycin [erythromycin base] and Penicillins  Family History: Family History  Problem Relation Age of Onset  . Lung cancer Father   . Brain cancer Father     Social History: Social History   Social History  . Marital status: Single    Spouse name: N/A  . Number of children: N/A  . Years  of education: N/A   Occupational History  . Not on file.   Social History Main Topics  . Smoking status: Former Smoker    Packs/day: 0.25    Years: 46.00    Types: Cigarettes    Quit date: 10/05/2016  . Smokeless tobacco: Never Used     Comment: 5 cigs qd (07/01/16)  . Alcohol use No  . Drug use: No  . Sexual activity: Yes    Birth control/ protection: None   Other Topics Concern  . Not on file   Social History Narrative  . No narrative on file    Electrocardiogram:  5/15  SR RBBB nonspecific ST/T wave changes 04/15/17  SR rate 81 RBBB LPFB   Assessment and Plan Chest Pain: recurrent unable to walk previously normal myovue 2012 felt horrible with lexiscan  CTA no hemodynamically significant stenosis 05/07/16  Given continued chest pains will arrange Outpatient cath. Risks including stroke, MI , contrast reaction bleeding discussed Willing to proceed Orders written Scheduled with Dr Tamala Julian on Monday Will need labs and CXR today   HTN: Poorly controlled add norvasc 10 mg to ARB/diuretic .   COPD  Decrease in PFTls needs to stop smoking conitnue inhalers f/u pulmonary at [facility]  GERD low carb diet weight loss and prilosec RBBB: chronic yearly ECG no high grade heart block   Ortho:  Encouraged him to see ortho and get xray's of his knees.   Jenkins Rouge, MD

## 2017-04-19 NOTE — Interval H&P Note (Signed)
Cath Lab Visit (complete for each Cath Lab visit)  Clinical Evaluation Leading to the Procedure:   ACS: No.  Non-ACS:    Anginal Classification: CCS Church  Anti-ischemic medical therapy: Minimal Therapy (1 class of medications)  Non-Invasive Test Results: Intermediate-risk stress test findings: cardiac mortality 1-3%/year  Prior CABG: No previous CABG      History and Physical Interval Note:  04/19/2017 7:21 AM  Robert Church  has presented today for surgery, with the diagnosis of cp  The various methods of treatment have been discussed with the patient and family. After consideration of risks, benefits and other options for treatment, the patient has consented to  Procedure(s): Left Heart Cath and Coronary Angiography (N/A) as a surgical intervention .  The patient's history has been reviewed, patient examined, no change in status, stable for surgery.  I have reviewed the patient's chart and labs.  Questions were answered to the patient's satisfaction.     Robert Church

## 2017-04-19 NOTE — Discharge Instructions (Signed)
Radial Site Care °Refer to this sheet in the next few weeks. These instructions provide you with information about caring for yourself after your procedure. Your health care provider may also give you more specific instructions. Your treatment has been planned according to current medical practices, but problems sometimes occur. Call your health care provider if you have any problems or questions after your procedure. °What can I expect after the procedure? °After your procedure, it is typical to have the following: °· Bruising at the radial site that usually fades within 1-2 weeks. °· Blood collecting in the tissue (hematoma) that may be painful to the touch. It should usually decrease in size and tenderness within 1-2 weeks. °Follow these instructions at home: °· Take medicines only as directed by your health care provider. °· You may shower 24-48 hours after the procedure or as directed by your health care provider. Remove the bandage (dressing) and gently wash the site with plain soap and water. Pat the area dry with a clean towel. Do not rub the site, because this may cause bleeding. °· Do not take baths, swim, or use a hot tub until your health care provider approves. °· Check your insertion site every day for redness, swelling, or drainage. °· Do not apply powder or lotion to the site. °· Do not flex or bend the affected arm for 24 hours or as directed by your health care provider. °· Do not push or pull heavy objects with the affected arm for 24 hours or as directed by your health care provider. °· Do not lift over 10 lb (4.5 kg) for 5 days after your procedure or as directed by your health care provider. °· Ask your health care provider when it is okay to: °¨ Return to work or school. °¨ Resume usual physical activities or sports. °¨ Resume sexual activity. °· Do not drive home if you are discharged the same day as the procedure. Have someone else drive you. °· You may drive 24 hours after the procedure  unless otherwise instructed by your health care provider. °· Do not operate machinery or power tools for 24 hours after the procedure. °· If your procedure was done as an outpatient procedure, which means that you went home the same day as your procedure, a responsible adult should be with you for the first 24 hours after you arrive home. °· Keep all follow-up visits as directed by your health care provider. This is important. °Contact a health care provider if: °· You have a fever. °· You have chills. °· You have increased bleeding from the radial site. Hold pressure on the site. °Get help right away if: °· You have unusual pain at the radial site. °· You have redness, warmth, or swelling at the radial site. °· You have drainage (other than a small amount of blood on the dressing) from the radial site. °· The radial site is bleeding, and the bleeding does not stop after 30 minutes of holding steady pressure on the site. °· Your arm or hand becomes pale, cool, tingly, or numb. °This information is not intended to replace advice given to you by your health care provider. Make sure you discuss any questions you have with your health care provider. °Document Released: 01/09/2011 Document Revised: 05/14/2016 Document Reviewed: 06/25/2014 °Elsevier Interactive Patient Education © 2017 [employer] Inc[employer] ° °

## 2017-04-27 ENCOUNTER — Telehealth: Payer: Self-pay | Admitting: Cardiovascular Disease

## 2017-04-27 NOTE — Telephone Encounter (Signed)
Patient stated his PCP advised him to change his Valsartan -HCTZ 160-12.5 mg to Valsartan 320 mg w/o HCTZ, due to patient's elevated uric acid and ankle swelling (gout). Patient wanted Dr. Kyla Balzarine advise on possible changes. Patient stated the swelling in his ankles have been going on for a while now, and it is not new. Patient stated his ankle swelling has gotten worse recently. Requested patient to have records faxed to our office. Will forward to Dr. Johnsie Cancel for advisement.

## 2017-04-27 NOTE — Telephone Encounter (Signed)
New message      Pt c/o medication issue:  1. Name of Medication: valsartan-HCTZ 2. How are you currently taking this medication (dosage and times per day)? 160-12.5  3. Are you having a reaction (difficulty breathing--STAT)? no 4. What is your medication issue?  Patient want to know if he can have valsartan 320mg  instead of the one with HCTZ.  Pt states that he saw his PCP and his uric acid was high.  PCP told pt it may be due to the HCTZ.  Also, pt states that his foot and ankle is swelling and he has gout in his ankle.  Please advise

## 2017-04-28 NOTE — Progress Notes (Signed)
Patient ID: Robert Church, male   DOB: November 11, 1956, 61 y.o.   MRN: [id number]     61 y.o.  smoker with atypical chest pain Previous low risk myovue and CT with less than 50% mid LAD and Calcium score 102. Recurrent pain 03/2017 and referred for cath No significant CAD   04/2016 started on lipitor and tricor for elevated lipids and triglycerides  Reviewed Cardiac CT done 05/07/16 Calcium score 102 68th percentile Less than 50% midxed plaque in proximal and mid LAD  Had claustrophobia and elevated HR during scan despite 15 mg lopressor   Working at [employer]. Primary wanted to increase ARB and stop diuretic due to gout ? Uric acid level not on meds like uloric   May have some other arthritis or connective tissue disease legs hurt all the time even after steroids Dr Pete Glatter has not started him on med to lower uric acid level  Reviewed labs and uric acid 10/1   ROS: Denies fever, malais, weight loss, blurry vision, decreased visual acuity, cough, sputum, SOB, hemoptysis, pleuritic pain, palpitaitons, heartburn, abdominal pain, melena, lower extremity edema, claudication, or rash.  All other systems reviewed and negative   General: Affect appropriate Overweight white male  HEENT: normal Neck supple with no adenopathy JVP normal no bruits no thyromegaly Lungs clear with no wheezing and good diaphragmatic motion Heart:  S1/S2 no murmur, no rub, gallop or click PMI normal Abdomen: benighn, BS positve, no tenderness, no AAA no bruit.  No HSM or HJR Distal pulses intact with no bruits No edema Neuro non-focal Skin warm and dry Arthritis both knees    Medications Current Outpatient Prescriptions  Medication Sig Dispense Refill  . albuterol (PROVENTIL HFA;VENTOLIN HFA) 108 (90 BASE) MCG/ACT inhaler Inhale 2 puffs into the lungs every 6 (six) hours as needed for wheezing. 1 Inhaler 11  . albuterol (PROVENTIL) (2.5 MG/3ML) 0.083% nebulizer solution Take 3 mLs (2.5 mg total) by  nebulization every 6 (six) hours as needed for wheezing or shortness of breath. 75 mL 12  . ALPRAZolam (XANAX) 1 MG tablet TAKE 1/2 TAB BY MOUTH IN THE AM, 1/2 TAB BY MOUTH IN THE AFTERNOON & 2 TABS BY MOUTH @@ BEDTIME    . amLODipine (NORVASC) 10 MG tablet Take 1 tablet (10 mg total) by mouth daily. 90 tablet 3  . aspirin EC 81 MG tablet Take 1 tablet (81 mg total) by mouth daily. 90 tablet 3  . Aspirin-Salicylamide-Caffeine (BC FAST PAIN RELIEF) 650-195-33.3 MG PACK Take 1 packet by mouth every 8 (eight) hours as needed (for knee pain.).    . Azelastine-Fluticasone 137-50 MCG/ACT SUSP 1 spray each nostril twice daily 23 g 6  . cholecalciferol (VITAMIN D) 1000 units tablet Take 1,000 Units by mouth daily.    . clindamycin (CLEOCIN) 150 MG capsule Take 150 mg by mouth daily. Prescribed twice daily patient is only taking once daily    . mometasone-formoterol (DULERA) 100-5 MCG/ACT AERO Inhale 2 puffs into the lungs 2 (two) times daily.    . mometasone-formoterol (DULERA) 200-5 MCG/ACT AERO Take 2 puffs first thing in am and then another 2 puffs about 12 hours later. 1 Inhaler 11  . nicotine (NICODERM CQ - DOSED IN MG/24 HOURS) 14 mg/24hr patch Place 14 mg onto the skin daily.    Marland Kitchen omeprazole (PRILOSEC) 20 MG capsule Take 20 mg by mouth 2 (two) times daily. ACID REFLUX    . Testosterone 30 MG/ACT SOLN Apply 1 application topically daily. Apply under  each arm daily    . Tiotropium Bromide Monohydrate (SPIRIVA RESPIMAT) 2.5 MCG/ACT AERS Inhale 2.5 mcg into the lungs 2 (two) times daily.    . valsartan (DIOVAN) 320 MG tablet Take 320 mg by mouth daily.    Marland Kitchen allopurinol (ZYLOPRIM) 300 MG tablet Take 1 tablet (300 mg total) by mouth daily. 90 tablet 3   No current facility-administered medications for this visit.     Allergies E-mycin [erythromycin base] and Penicillins  Family History: Family History  Problem Relation Age of Onset  . Lung cancer Father   . Brain cancer Father     Social  History: Social History   Social History  . Marital status: Single    Spouse name: N/A  . Number of children: N/A  . Years of education: N/A   Occupational History  . Not on file.   Social History Main Topics  . Smoking status: Former Smoker    Packs/day: 0.25    Years: 46.00    Types: Cigarettes    Quit date: 10/05/2016  . Smokeless tobacco: Never Used     Comment: 5 cigs qd (07/01/16)  . Alcohol use No  . Drug use: No  . Sexual activity: Yes    Birth control/ protection: None   Other Topics Concern  . Not on file   Social History Narrative  . No narrative on file    Electrocardiogram:  5/15  SR RBBB nonspecific ST/T wave changes 04/15/17  SR rate 81 RBBB LPFB   Assessment and Plan Chest Pain: recurrent unable to walk previously normal myovue 2012 felt horrible with lexiscan  CTA no hemodynamically significant stenosis 05/07/16   Cath 04/19/17 normal coronary arteries Non cardiac chest pain  HTN: stop diuretic increase avapro 320  COPD  Decrease in PFTls needs to stop smoking conitnue inhalers f/u pulmonary at [facility]  GERD low carb diet weight loss and prilosec RBBB: chronic yearly ECG no high grade heart block   Ortho:  Encouraged him to see ortho and get xray's of his knees.  Gout:  Start allopurinol f/u uric acid level in 6 weeks f/u primary consider rheumatology referral   Jenkins Rouge, MD

## 2017-04-28 NOTE — Telephone Encounter (Signed)
He is not even on meds to lower uric acid level like uloric What is his uric acid level ?? If less than 6 diuretic should not be issue Would consider adding uloric and keeping diuretic on board for edema And better BP control

## 2017-04-29 ENCOUNTER — Encounter: Payer: Self-pay | Admitting: Cardiovascular Disease

## 2017-05-03 ENCOUNTER — Encounter (INDEPENDENT_AMBULATORY_CARE_PROVIDER_SITE_OTHER): Payer: Self-pay

## 2017-05-03 ENCOUNTER — Encounter: Payer: Self-pay | Admitting: Cardiovascular Disease

## 2017-05-03 ENCOUNTER — Ambulatory Visit (INDEPENDENT_AMBULATORY_CARE_PROVIDER_SITE_OTHER): Payer: BLUE CROSS/BLUE SHIELD | Admitting: Cardiovascular Disease

## 2017-05-03 VITALS — BP 126/74 | HR 78 | Ht 70.0 in | Wt 215.8 lb

## 2017-05-03 DIAGNOSIS — Z79899 Other long term (current) drug therapy: Secondary | ICD-10-CM | POA: Diagnosis not present

## 2017-05-03 DIAGNOSIS — E790 Hyperuricemia without signs of inflammatory arthritis and tophaceous disease: Secondary | ICD-10-CM

## 2017-05-03 DIAGNOSIS — I10 Essential (primary) hypertension: Secondary | ICD-10-CM | POA: Diagnosis not present

## 2017-05-03 MED ORDER — ALLOPURINOL 300 MG PO TABS: 300.0000 mg | ORAL_TABLET | Freq: Every day | ORAL | 3 refills | Status: DC

## 2017-05-03 NOTE — Patient Instructions (Addendum)
Medication Instructions:  Your physician has recommended you make the following change in your medication:  1-START allopurinol 300 mg by mouth daily  Labwork: Your physician recommends that you return for lab work in: 6 weeks for uric acid  Testing/Procedures: NONE  Follow-Up: Your physician wants you to follow-up in: 6 months with Dr. Johnsie Cancel. You will receive a reminder letter in the mail two months in advance. If you don't receive a letter, please call our office to schedule the follow-up appointment.   If you need a refill on your cardiac medications before your next appointment, please call your pharmacy.

## 2017-05-04 NOTE — Telephone Encounter (Signed)
Patient's concerns were addressed at his appointment.

## 2017-05-24 ENCOUNTER — Ambulatory Visit
Admission: RE | Admit: 2017-05-24 | Discharge: 2017-05-24 | Disposition: A | Discharge status: Home / Self Care | Payer: BLUE CROSS/BLUE SHIELD | Source: Ambulatory Visit | Attending: Family Medicine | Admitting: Family Medicine

## 2017-05-24 ENCOUNTER — Other Ambulatory Visit: Payer: Self-pay | Admitting: Family Medicine

## 2017-05-24 DIAGNOSIS — M79671 Pain in right foot: Secondary | ICD-10-CM

## 2017-05-24 DIAGNOSIS — M109 Gout, unspecified: Secondary | ICD-10-CM

## 2017-06-14 ENCOUNTER — Other Ambulatory Visit: Payer: BLUE CROSS/BLUE SHIELD

## 2017-07-14 ENCOUNTER — Telehealth: Payer: Self-pay | Admitting: Cardiovascular Disease

## 2017-07-14 MED ORDER — IRBESARTAN 300 MG PO TABS: 300.0000 mg | ORAL_TABLET | Freq: Every day | ORAL | 3 refills | Status: DC

## 2017-07-14 NOTE — Telephone Encounter (Signed)
Pt received call from his work healthcare provider.  They told him about Valsartan being recalled and changed him to Losartan 100mg  QD.  He has been taking this for 3 days.  States BP has been elevated since he started it.  BP today was 157/99.  Advised pt I would send message to our [facility] to see if they feel Losartan is appropriate or if he would benefit from a different medication?

## 2017-07-14 NOTE — Telephone Encounter (Signed)
Spoke with pt and informed him of recommendations per our [facility].  Pt wanted to go ahead and switch to Irbesartan.  Sent prescription to preferred pharmacy.  Pt appreciative for assistance.

## 2017-07-14 NOTE — Telephone Encounter (Signed)
Losartan tends to not be as potent as other ARBs on blood pressure. If his pressure is trending up we could change him to irbesartan 300mg  daily and that should be similar potency as the valsartan 320mg  he was previously on.

## 2017-07-14 NOTE — Telephone Encounter (Signed)
Pt c/o medication issue:  1. Name of [place]  2. How are you currently taking this medication (dosage and times per day)? 325mg  once a day   3. Are you having a reaction (difficulty breathing--STAT)? No  4. What is your medication issue? Recall on Valsartan

## 2017-09-13 ENCOUNTER — Telehealth: Payer: Self-pay | Admitting: Cardiovascular Disease

## 2017-09-13 NOTE — Telephone Encounter (Signed)
Pt c/o BP issue: STAT if pt c/o blurred vision, one-sided weakness or slurred speech  1. What are your last 5 BP readings? 157/95 2. Are you having any other symptoms (ex. Dizziness, headache, blurred vision, passed out)? headaches 3. What is your BP issue? high

## 2017-09-13 NOTE — Telephone Encounter (Signed)
Patient called complaining of his BP being elevated and a headache for 3 weeks. Patient stated his pulmonologist was treating his headaches with prednisone and antibiotic saying it is his sinuses. Advised patient to keep track of his BP and see how it's trending. Patient stated he was going to check it again when he gets home. Patient is taking amlodipine 10 mg and irbesartan 300 mg - which was changed from valsartan 320 mg back on 07/14/17. Encouraged patient to keep a low salt diet and cut down on caffeine.  Encouraged patient to follow up with his pulmonologist that was treating his H/A and his PCP. Will forward to Dr. Johnsie Cancel to see if patient might benefit from HTN clinic and any other advisement.

## 2017-09-14 NOTE — Telephone Encounter (Signed)
His primary should be able to take care of this should get better when of prednisone and headache better

## 2017-09-15 NOTE — Telephone Encounter (Signed)
Called patient back about Dr. Kyla Balzarine message. Patient stated his BP is a little better. Patient stated he is no longer on prednisone and his headache has gone away. Patient will keep an eye on his BP and call if he BP is continuously high.

## 2017-10-07 ENCOUNTER — Telehealth: Payer: Self-pay | Admitting: Cardiovascular Disease

## 2017-10-07 NOTE — Telephone Encounter (Signed)
Called patient about his message. Informed patient that he might need to see our HTN clinic to get his BP under control. Patient complaining of edema in bilateral ankles. Consulted Dr. Tamala Julian, DOD, he thinks a referral to HTN clinic is a good idea and he would not change any medications at this time. Called patient back with recommendations. Patient verbalized understanding and will see HTN clinic in 1 week.

## 2017-10-07 NOTE — Telephone Encounter (Signed)
°  Pt c/o BP issue: STAT if pt c/o blurred vision, one-sided weakness or slurred speech  1. What are your last 5 BP readings? 174/92  177/98     2. Are you having any other symptoms (ex. Dizziness, headache, blurred vision, passed out)? no 3. What is your BP issue? high

## 2017-10-13 ENCOUNTER — Ambulatory Visit: Payer: BLUE CROSS/BLUE SHIELD | Admitting: Pharmacist

## 2017-10-13 NOTE — Progress Notes (Deleted)
Patient ID: Robert Church                 DOB: Apr 17, 1956                      MRN: [id number]     HPI: Robert Church is a 61 y.o. male referred by Dr. Johnsie Cancel to HTN clinic. PMH is significant for  HTN, CAD, GERD, COPD, and gout. Pt was affected by valsartan recall in July 2018 and was switched to losartan. This did not control his BP and he was then switched to irbesartan. Pt has called clinic a few times since then with elevated BP readings of 170s/90s and headaches. Most recently, he called in to clinic with reports of bilateral leg edema. He was advised to f/u in HTN clinic.  Bilateral LEE - may need to decrease spiro dose Chlorthalidone or spironolactone  Current HTN meds: amlodipine 10mg  daily, irbesartan 300mg  daily Previously tried: thiazide - d/c'ed d/t gout BP goal: <130/87mmHg  Family History: Non contributory.  Social History: Former smoker for 46 years, quit in 2017. Denies alcohol and illicit drug use.  Diet:   Exercise:   Home BP readings:   Wt Readings from Last 3 Encounters:  05/03/17 215 lb 12.8 oz (97.9 kg)  04/19/17 217 lb (98.4 kg)  04/15/17 218 lb (98.9 kg)   BP Readings from Last 3 Encounters:  05/03/17 126/74  04/19/17 (!) 141/97  04/15/17 (!) 170/108   Pulse Readings from Last 3 Encounters:  05/03/17 78  04/19/17 68  04/15/17 80    Renal function: CrCl cannot be calculated (Patient's most recent lab result is older than the maximum 21 days allowed.).  Past Medical History:  Diagnosis Date  . Allergy   . Asthma   . Bronchitis   . COPD (chronic obstructive pulmonary disease) ([place])   . GERD (gastroesophageal reflux disease)   . Hypertension   . Pneumonia   . Sinusitis   . SOB (shortness of breath)     Current Outpatient Prescriptions on File Prior to Visit  Medication Sig Dispense Refill  . albuterol (PROVENTIL HFA;VENTOLIN HFA) 108 (90 BASE) MCG/ACT inhaler Inhale 2 puffs into the lungs every 6 (six) hours as needed for wheezing. 1  Inhaler 11  . albuterol (PROVENTIL) (2.5 MG/3ML) 0.083% nebulizer solution Take 3 mLs (2.5 mg total) by nebulization every 6 (six) hours as needed for wheezing or shortness of breath. 75 mL 12  . allopurinol (ZYLOPRIM) 300 MG tablet Take 1 tablet (300 mg total) by mouth daily. 90 tablet 3  . ALPRAZolam (XANAX) 1 MG tablet TAKE 1/2 TAB BY MOUTH IN THE AM, 1/2 TAB BY MOUTH IN THE AFTERNOON & 2 TABS BY MOUTH @@ BEDTIME    . amLODipine (NORVASC) 10 MG tablet Take 1 tablet (10 mg total) by mouth daily. 90 tablet 3  . aspirin EC 81 MG tablet Take 1 tablet (81 mg total) by mouth daily. 90 tablet 3  . Aspirin-Salicylamide-Caffeine (BC FAST PAIN RELIEF) 650-195-33.3 MG PACK Take 1 packet by mouth every 8 (eight) hours as needed (for knee pain.).    . Azelastine-Fluticasone 137-50 MCG/ACT SUSP 1 spray each nostril twice daily 23 g 6  . cholecalciferol (VITAMIN D) 1000 units tablet Take 1,000 Units by mouth daily.    . clindamycin (CLEOCIN) 150 MG capsule Take 150 mg by mouth daily. Prescribed twice daily patient is only taking once daily    . irbesartan (AVAPRO) 300 MG tablet  Take 1 tablet (300 mg total) by mouth daily. 90 tablet 3  . mometasone-formoterol (DULERA) 100-5 MCG/ACT AERO Inhale 2 puffs into the lungs 2 (two) times daily.    . mometasone-formoterol (DULERA) 200-5 MCG/ACT AERO Take 2 puffs first thing in am and then another 2 puffs about 12 hours later. 1 Inhaler 11  . nicotine (NICODERM CQ - DOSED IN MG/24 HOURS) 14 mg/24hr patch Place 14 mg onto the skin daily.    Marland Kitchen omeprazole (PRILOSEC) 20 MG capsule Take 20 mg by mouth 2 (two) times daily. ACID REFLUX    . Testosterone 30 MG/ACT SOLN Apply 1 application topically daily. Apply under each arm daily    . Tiotropium Bromide Monohydrate (SPIRIVA RESPIMAT) 2.5 MCG/ACT AERS Inhale 2.5 mcg into the lungs 2 (two) times daily.     No current facility-administered medications on file prior to visit.     Allergies  Allergen Reactions  . E-Mycin  [Erythromycin Base] Swelling  . Penicillins Swelling and Other (See Comments)    Childhood rxn--MD stated he "almost died" Has patient had a PCN reaction causing immediate rash, facial/tongue/throat swelling, SOB or lightheadedness with hypotension:Yes Has patient had a PCN reaction causing severe rash involving mucus membranes or skin necrosis:unsure Has patient had a PCN reaction that required hospitalization:unsure Has patient had a PCN reaction occurring within the last 10 years:No If all of the above answers are "NO", then may proceed with Cephalosporin use.       Assessment/Plan:  1. Hypertension -

## 2017-12-09 ENCOUNTER — Other Ambulatory Visit: Payer: Self-pay | Admitting: Family Medicine

## 2017-12-09 DIAGNOSIS — R519 Headache, unspecified: Secondary | ICD-10-CM

## 2017-12-09 DIAGNOSIS — R51 Headache: Principal | ICD-10-CM

## 2017-12-09 LAB — HM COLONOSCOPY

## 2017-12-10 ENCOUNTER — Ambulatory Visit
Admission: RE | Admit: 2017-12-10 | Discharge: 2017-12-10 | Disposition: A | Discharge status: Home / Self Care | Payer: BLUE CROSS/BLUE SHIELD | Source: Ambulatory Visit | Attending: Family Medicine | Admitting: Family Medicine

## 2017-12-10 DIAGNOSIS — R51 Headache: Principal | ICD-10-CM

## 2017-12-10 DIAGNOSIS — R519 Headache, unspecified: Secondary | ICD-10-CM

## 2017-12-13 ENCOUNTER — Other Ambulatory Visit: Payer: Self-pay | Admitting: Family Medicine

## 2017-12-15 DIAGNOSIS — E785 Hyperlipidemia, unspecified: Secondary | ICD-10-CM | POA: Insufficient documentation

## 2018-06-27 ENCOUNTER — Other Ambulatory Visit: Payer: Self-pay

## 2018-06-27 ENCOUNTER — Encounter (HOSPITAL_COMMUNITY): Payer: Self-pay | Admitting: Emergency Medicine

## 2018-06-27 ENCOUNTER — Emergency Department (HOSPITAL_COMMUNITY)
Admission: EM | Admit: 2018-06-27 | Discharge: 2018-06-27 | Disposition: A | Discharge status: Home / Self Care | Payer: BLUE CROSS/BLUE SHIELD | Attending: Emergency Medicine | Admitting: Emergency Medicine

## 2018-06-27 ENCOUNTER — Emergency Department (HOSPITAL_COMMUNITY): Payer: BLUE CROSS/BLUE SHIELD

## 2018-06-27 DIAGNOSIS — R079 Chest pain, unspecified: Secondary | ICD-10-CM | POA: Diagnosis present

## 2018-06-27 DIAGNOSIS — J449 Chronic obstructive pulmonary disease, unspecified: Secondary | ICD-10-CM | POA: Insufficient documentation

## 2018-06-27 DIAGNOSIS — J45909 Unspecified asthma, uncomplicated: Secondary | ICD-10-CM | POA: Insufficient documentation

## 2018-06-27 DIAGNOSIS — Z87891 Personal history of nicotine dependence: Secondary | ICD-10-CM | POA: Insufficient documentation

## 2018-06-27 DIAGNOSIS — R072 Precordial pain: Secondary | ICD-10-CM

## 2018-06-27 DIAGNOSIS — Z79899 Other long term (current) drug therapy: Secondary | ICD-10-CM | POA: Insufficient documentation

## 2018-06-27 DIAGNOSIS — I2510 Atherosclerotic heart disease of native coronary artery without angina pectoris: Secondary | ICD-10-CM | POA: Diagnosis not present

## 2018-06-27 DIAGNOSIS — I10 Essential (primary) hypertension: Secondary | ICD-10-CM | POA: Insufficient documentation

## 2018-06-27 LAB — CBC
HCT: 51.1 % (ref 39.0–52.0)
Hemoglobin: 17.5 g/dL — ABNORMAL HIGH (ref 13.0–17.0)
MCH: 33.8 pg (ref 26.0–34.0)
MCHC: 34.2 g/dL (ref 30.0–36.0)
MCV: 98.6 fL (ref 78.0–100.0)
Platelets: 216 10*3/uL (ref 150–400)
RBC: 5.18 MIL/uL (ref 4.22–5.81)
RDW: 11.6 % (ref 11.5–15.5)
WBC: 7.4 10*3/uL (ref 4.0–10.5)

## 2018-06-27 LAB — I-STAT TROPONIN, ED
Troponin i, poc: 0 ng/mL (ref 0.00–0.08)
Troponin i, poc: 0 ng/mL (ref 0.00–0.08)

## 2018-06-27 LAB — BASIC METABOLIC PANEL
Anion gap: 5 (ref 5–15)
BUN: 11 mg/dL (ref 6–20)
CO2: 31 mmol/L (ref 22–32)
Calcium: 9 mg/dL (ref 8.9–10.3)
Chloride: 104 mmol/L (ref 98–111)
Creatinine, Ser: 0.87 mg/dL (ref 0.61–1.24)
GFR calc Af Amer: >60 mL/min (ref >60)
GFR calc non Af Amer: >60 mL/min (ref >60)
Glucose, Bld: 125 mg/dL — ABNORMAL HIGH (ref 70–99)
Potassium: 4 mmol/L (ref 3.5–5.1)
Sodium: 140 mmol/L (ref 135–145)

## 2018-06-27 NOTE — Discharge Instructions (Addendum)
Return here as needed.  Call your cardiologist tomorrow morning and see if they can see you sooner than your scheduled appointment

## 2018-06-27 NOTE — ED Triage Notes (Signed)
Pt. Stated, Ive had chest pain for 3-4 days and now I have light headedness, and some nausea.  Pt. Drove himself to ED.

## 2018-06-29 NOTE — ED Provider Notes (Signed)
[facility] EMERGENCY DEPARTMENT Provider Note   CSN: [id number] Arrival date & time: 06/27/18  1316     History   Chief Complaint Chief Complaint  Patient presents with  . Chest Pain  . Nausea  . Dizziness    HPI Robert Church is a 62 y.o. male.  HPI Patient presents to the emergency department with a four-day history of intermittent chest pain.  The patient states that he is been having episodes of chest discomfort in the left side of his chest.  The patient states that these episodes last for 10 minutes at a time.  They do not seem to be related to exertion.  Patient states that he has chronic shortness of breath and that is not a new finding.  Patient states he did have some lightheadedness earlier today along with mild nausea.  The patient states that nothing seemed to make his condition better or worse.  Patient did not take any medications prior to arrival for symptoms. the patient denies headache,blurred vision, neck pain, fever, cough, weakness, numbness, dizziness, anorexia, edema, abdominal pain, vomiting, diarrhea, rash, back pain, dysuria, hematemesis, bloody stool, near syncope, or syncope. Past Medical History:  Diagnosis Date  . Allergy   . Asthma   . Bronchitis   . COPD (chronic obstructive pulmonary disease) ([place])   . GERD (gastroesophageal reflux disease)   . Hypertension   . Pneumonia   . Sinusitis   . SOB (shortness of breath)     Patient Active Problem List   Diagnosis Date Noted  . CAD (coronary artery disease) 04/19/2017  . Dyspnea 12/21/2015  . Morbid obesity ([place]) 11/24/2015  . Cigarette smoker 01/16/2015  . COPD GOLD C 03/03/2013  . Sleep apnea 01/05/2013  . Asbestos exposure 01/05/2013  . Seasonal allergic rhinitis 10/06/2012  . Chronic cough 10/06/2012  . GERD (gastroesophageal reflux disease) 06/05/2012  . Insomnia 06/05/2012  . 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 Chest pain 07/23/2011  . HTN (hypertension) 07/23/2011     Past Surgical History:  Procedure Laterality Date  . LEFT HEART CATH AND CORONARY ANGIOGRAPHY N/A 04/19/2017   Procedure: Left Heart Cath and Coronary Angiography;  Surgeon: Belva Crome, MD;  Location: [facility];  Service: Cardiovascular;  Laterality: N/A;  . TONSILLECTOMY          Home Medications    Prior to Admission medications   Medication Sig Start Date End Date Taking? Authorizing Provider  albuterol (PROVENTIL HFA;VENTOLIN HFA) 108 (90 BASE) MCG/ACT inhaler Inhale 2 puffs into the lungs every 6 (six) hours as needed for wheezing. 10/09/14  Yes Collene Gobble, MD  albuterol (PROVENTIL) (2.5 MG/3ML) 0.083% nebulizer solution Take 3 mLs (2.5 mg total) by nebulization every 6 (six) hours as needed for wheezing or shortness of breath. 05/29/14  Yes Collene Gobble, MD  allopurinol (ZYLOPRIM) 300 MG tablet Take 1 tablet (300 mg total) by mouth daily. 05/03/17  Yes Josue Hector, MD  ALPRAZolam Duanne Moron) 1 MG tablet Take 0.5-2 mg by mouth See admin instructions. 0.5 mg in the morning and afternoon and take 2mg  at bedtime   Yes [provider]  amLODipine (NORVASC) 10 MG tablet Take 1 tablet (10 mg total) by mouth daily. 04/15/17 06/27/18 Yes Josue Hector, MD  aspirin EC 81 MG tablet Take 1 tablet (81 mg total) by mouth daily. 04/15/17  Yes Josue Hector, MD  Aspirin-Salicylamide-Caffeine (BC FAST PAIN RELIEF) [phone] MG PACK Take 1 packet by mouth  every 8 (eight) hours as needed (for knee pain.).   Yes [provider]  Azelastine-Fluticasone 137-50 MCG/ACT SUSP 1 spray each nostril twice daily 08/31/14  Yes Byrum, Rose Fillers, MD  cholecalciferol (VITAMIN D) 1000 units tablet Take 1,000 Units by mouth daily.   Yes [provider]  furosemide (LASIX) 20 MG tablet Take 20 mg by mouth daily.   Yes [provider]  irbesartan (AVAPRO) 300 MG tablet Take 1 tablet (300 mg total) by mouth daily. 07/14/17  Yes Belva Crome, MD  losartan (COZAAR)  100 MG tablet Take 100 mg by mouth daily.   Yes [provider]  mometasone-formoterol (DULERA) 100-5 MCG/ACT AERO Inhale 2 puffs into the lungs 2 (two) times daily.   Yes [provider]  mometasone-formoterol (DULERA) 200-5 MCG/ACT AERO Take 2 puffs first thing in am and then another 2 puffs about 12 hours later. 01/15/15  Yes Tanda Rockers, MD  omeprazole (PRILOSEC) 20 MG capsule Take 20 mg by mouth 2 (two) times daily. ACID REFLUX   Yes [provider]  Testosterone 30 MG/ACT SOLN Apply 1 application topically daily. Apply under each arm daily   Yes [provider]  Tiotropium Bromide Monohydrate (SPIRIVA RESPIMAT) 2.5 MCG/ACT AERS Inhale 2.5 mcg into the lungs 2 (two) times daily.   Yes [provider]    Family History Family History  Problem Relation Age of Onset  . Lung cancer Father   . Brain cancer Father     Social History Social History   Tobacco Use  . Smoking status: Former Smoker    Packs/day: 0.25    Years: 46.00    Pack years: 11.50    Types: Cigarettes    Last attempt to quit: 10/05/2016    Years since quitting: 1.7  . Smokeless tobacco: Never Used  . Tobacco comment: 5 cigs qd (07/01/16)  Substance Use Topics  . Alcohol use: No    Alcohol/week: 0.0 oz  . Drug use: No     Allergies   Clindamycin; E-mycin [erythromycin base]; Penicillins; and Rosuvastatin   Review of Systems Review of Systems All other systems negative except as documented in the HPI. All pertinent positives and negatives as reviewed in the HPI.  Physical Exam Updated Vital Signs BP (!) 148/95   Pulse 75   Temp 97.8 F (36.6 C) (Oral)   Resp 20   Ht 5\' 10"  (1.778 m)   Wt 97.1 kg (214 lb)   SpO2 92%   BMI 30.71 kg/m   Physical Exam  Constitutional: He is oriented to person, place, and time. He appears well-developed and well-nourished. No distress.  HENT:  Head: Normocephalic and atraumatic.  Mouth/Throat: Oropharynx is clear and  moist.  Eyes: Pupils are equal, round, and reactive to light.  Neck: Normal range of motion. Neck supple.  Cardiovascular: Normal rate, regular rhythm and normal heart sounds. Exam reveals no gallop and no friction rub.  No murmur heard. Pulmonary/Chest: Effort normal and breath sounds normal. No respiratory distress. He has no wheezes.  Abdominal: Soft. Bowel sounds are normal. He exhibits no distension. There is no tenderness.  Neurological: He is alert and oriented to person, place, and time. He exhibits normal muscle tone. Coordination normal.  Skin: Skin is warm and dry. Capillary refill takes less than 2 seconds. No rash noted. No erythema.  Psychiatric: He has a normal mood and affect. His behavior is normal.  Nursing note and vitals reviewed.    ED Treatments /  Results  Labs (all labs ordered are listed, but only abnormal results are displayed) Labs Reviewed  BASIC METABOLIC PANEL - Abnormal; Notable for the following components:      Result Value   Glucose, Bld 125 (*)    All other components within normal limits  CBC - Abnormal; Notable for the following components:   Hemoglobin 17.5 (*)    All other components within normal limits  I-STAT TROPONIN, ED  I-STAT TROPONIN, ED    EKG EKG Interpretation  Date/Time:  Monday June 27 2018 20:57:41 EDT Ventricular Rate:  77 PR Interval:  212 QRS Duration: 176 QT Interval:  445 QTC Calculation: 504 R Axis:   162 Text Interpretation:  Sinus rhythm Prolonged PR interval Right bundle branch block Unchanged Confirmed by Thayer Jew [phone]) on 06/27/2018 9:01:04 PM Also confirmed by Thayer Jew [id number]), editor Lynder Parents [phone])  on 06/28/2018 7:25:36 AM   Radiology Dg Chest 2 View  Result Date: 06/27/2018 CLINICAL DATA:  Chest pain and shortness of breath EXAM: CHEST - 2 VIEW COMPARISON:  April 15, 2017 FINDINGS: There is a nipple shadow on the left. There is no edema or consolidation. Heart size and pulmonary  vascularity are normal. No adenopathy. No pneumothorax. No bone lesions. IMPRESSION: No edema or consolidation. Electronically Signed   By: Lowella Grip III M.D.   On: 06/27/2018 14:38    Procedures Procedures (including critical care time)  Medications Ordered in ED Medications - No data to display   Initial Impression / Assessment and Plan / ED Course  I have reviewed the triage vital signs and the nursing notes.  Pertinent labs & imaging results that were available during my care of the patient were reviewed by me and considered in my medical decision making (see chart for details).    Patient has 2 sets of negative cardiac enzymes along with negative chest x-ray and EKG does not show any abnormality.  The patient had a normal cardiac catheterization in April 2018.  At this point I advised the patient that this could represent still a cardiac issue but at this point no definitive cardiac damage or MI has occurred.  I did advise him that he will need to see his cardiologist very closely and patient agrees to the plan.  The patient advised to return for any worsening in his condition.  I did say that we cannot fully eliminate his heart as a cause but with the totally negative cath and his negative work-up tonight we feel more reassured.  Final Clinical Impressions(s) / ED Diagnoses   Final diagnoses:  Precordial pain    ED Discharge Orders    None       Dalia Heading, PA-C 06/29/18 0104    Merryl Hacker, MD 07/02/18 [phone]

## 2018-07-01 ENCOUNTER — Encounter: Payer: Self-pay | Admitting: Cardiovascular Disease

## 2018-07-01 ENCOUNTER — Ambulatory Visit: Payer: BLUE CROSS/BLUE SHIELD | Admitting: Cardiovascular Disease

## 2018-07-01 VITALS — BP 106/64 | HR 74 | Ht 70.0 in | Wt 218.0 lb

## 2018-07-01 DIAGNOSIS — F17210 Nicotine dependence, cigarettes, uncomplicated: Secondary | ICD-10-CM | POA: Diagnosis not present

## 2018-07-01 DIAGNOSIS — I428 Other cardiomyopathies: Secondary | ICD-10-CM | POA: Diagnosis not present

## 2018-07-01 DIAGNOSIS — G4730 Sleep apnea, unspecified: Secondary | ICD-10-CM

## 2018-07-01 DIAGNOSIS — I10 Essential (primary) hypertension: Secondary | ICD-10-CM

## 2018-07-01 DIAGNOSIS — R072 Precordial pain: Secondary | ICD-10-CM

## 2018-07-01 NOTE — Progress Notes (Signed)
07/01/2018 Robert Church   11/06/1956  [id number]  Primary Physician Hamrick, Lorin Mercy, MD Primary Cardiologist: [name] MD Garret Reddish, [place], [place]  HPI:  Robert Church is a 62 y.o. severely overweight divorced Caucasian male father of 1 grandchild who works in [employer].  He was referred because of chest pain by Dr. Lisbeth Ply for cardia vascular evaluation.  His mother is at [facility], 1 of my long-term patients as well.  He saw Dr. Johnsie Cancel in the past.  His risk factors include ongoing tobacco abuse, hypertension and family history.  Is never had a heart attack or stroke.  He did have a heart catheterization performed by Dr. Tamala Julian on 04/19/2017 that showed normal coronary arteries with an EF of 40% consistent with a nonischemic cardia myopathy.  He was seen in the ER earlier this week with chest pain and ruled out for myocardial infarction.   Current Meds  Medication Sig  . albuterol (PROVENTIL HFA;VENTOLIN HFA) 108 (90 BASE) MCG/ACT inhaler Inhale 2 puffs into the lungs every 6 (six) hours as needed for wheezing.  Marland Kitchen albuterol (PROVENTIL) (2.5 MG/3ML) 0.083% nebulizer solution Take 3 mLs (2.5 mg total) by nebulization every 6 (six) hours as needed for wheezing or shortness of breath.  . allopurinol (ZYLOPRIM) 100 MG tablet Take 200 mg by mouth daily.  Marland Kitchen ALPRAZolam (XANAX) 1 MG tablet Take 0.5-2 mg by mouth See admin instructions. 0.5 mg in the morning and afternoon and take 2mg  at bedtime  . amLODipine (NORVASC) 10 MG tablet Take 1 tablet (10 mg total) by mouth daily.  Marland Kitchen aspirin EC 81 MG tablet Take 1 tablet (81 mg total) by mouth daily.  . Aspirin-Salicylamide-Caffeine (BC FAST PAIN RELIEF) 650-195-33.3 MG PACK Take 1 packet by mouth every 8 (eight) hours as needed (for knee pain.).  . Azelastine-Fluticasone 137-50 MCG/ACT SUSP 1 spray each nostril twice daily  . cholecalciferol (VITAMIN D) 1000 units tablet Take 1,000 Units by mouth daily.  . furosemide (LASIX) 20 MG  tablet Take 20 mg by mouth every morning.   . irbesartan (AVAPRO) 300 MG tablet Take 1 tablet (300 mg total) by mouth daily.  Marland Kitchen losartan (COZAAR) 100 MG tablet Take 100 mg by mouth daily.  . mometasone-formoterol (DULERA) 100-5 MCG/ACT AERO Inhale 2 puffs into the lungs 2 (two) times daily.  . mometasone-formoterol (DULERA) 200-5 MCG/ACT AERO Take 2 puffs first thing in am and then another 2 puffs about 12 hours later.  Marland Kitchen omeprazole (PRILOSEC) 20 MG capsule Take 20 mg by mouth 2 (two) times daily. ACID REFLUX  . spironolactone (ALDACTONE) 25 MG tablet Take 25 mg by mouth daily.  . Testosterone 30 MG/ACT SOLN Apply 1 application topically daily. Apply under each arm daily  . Tiotropium Bromide Monohydrate (SPIRIVA RESPIMAT) 2.5 MCG/ACT AERS Inhale 2.5 mcg into the lungs 2 (two) times daily.     Allergies  Allergen Reactions  . Clindamycin Other (See Comments)    Tongue swelling  . E-Mycin [Erythromycin Base] Swelling  . Penicillins Swelling and Other (See Comments)    Childhood rxn--MD stated he "almost died" Has patient had a PCN reaction causing immediate rash, facial/tongue/throat swelling, SOB or lightheadedness with hypotension:Yes Has patient had a PCN reaction causing severe rash involving mucus membranes or skin necrosis:unsure Has patient had a PCN reaction that required hospitalization:unsure Has patient had a PCN reaction occurring within the last 10 years:No If all of the above answers are "NO", then may proceed  with Cephalosporin use.    . Rosuvastatin     Other reaction(s): Myalgias (intolerance)    Social History   Socioeconomic History  . Marital status: Single    Spouse name: Not on file  . Number of children: Not on file  . Years of education: Not on file  . Highest education level: Not on file  Occupational History  . Not on file  Social Needs  . Financial resource strain: Not on file  . Food insecurity:    Worry: Not on file    Inability: Not on file  .  Transportation needs:    Medical: Not on file    Non-medical: Not on file  Tobacco Use  . Smoking status: Former Smoker    Packs/day: 0.25    Years: 46.00    Pack years: 11.50    Types: Cigarettes    Last attempt to quit: 10/05/2016    Years since quitting: 1.7  . Smokeless tobacco: Never Used  . Tobacco comment: 5 cigs qd (07/01/16)  Substance and Sexual Activity  . Alcohol use: No    Alcohol/week: 0.0 oz  . Drug use: No  . Sexual activity: Yes    Birth control/protection: None  Lifestyle  . Physical activity:    Days per week: Not on file    Minutes per session: Not on file  . Stress: Not on file  Relationships  . Social connections:    Talks on phone: Not on file    Gets together: Not on file    Attends religious service: Not on file    Active member of club or organization: Not on file    Attends meetings of clubs or organizations: Not on file    Relationship status: Not on file  . Intimate partner violence:    Fear of current or ex partner: Not on file    Emotionally abused: Not on file    Physically abused: Not on file    Forced sexual activity: Not on file  Other Topics Concern  . Not on file  Social History Narrative  . Not on file     Review of Systems: General: negative for chills, fever, night sweats or weight changes.  Cardiovascular: negative for chest pain, dyspnea on exertion, edema, orthopnea, palpitations, paroxysmal nocturnal dyspnea or shortness of breath Dermatological: negative for rash Respiratory: negative for cough or wheezing Urologic: negative for hematuria Abdominal: negative for nausea, vomiting, diarrhea, bright red blood per rectum, melena, or hematemesis Neurologic: negative for visual changes, syncope, or dizziness All other systems reviewed and are otherwise negative except as noted above.    Blood pressure 106/64, pulse 74, height 5\' 10"  (1.778 m), weight 218 lb (98.9 kg).  General appearance: alert and no distress Neck: no  adenopathy, no carotid bruit, no JVD, supple, symmetrical, trachea midline and thyroid not enlarged, symmetric, no tenderness/mass/nodules Lungs: clear to auscultation bilaterally Heart: regular rate and rhythm, S1, S2 normal, no murmur, click, rub or gallop Extremities: extremities normal, atraumatic, no cyanosis or edema Pulses: 2+ and symmetric Skin: Skin color, texture, turgor normal. No rashes or lesions Neurologic: Alert and oriented X 3, normal strength and tone. Normal symmetric reflexes. Normal coordination and gait  EKG not performed today  ASSESSMENT AND PLAN:   Chest pain Mr. Bilotti was recently seen in the emergency room this past Monday with chest pain.  He had a heart catheterization performed by Dr. Tamala Julian 04/19/2017 which was entirely normal.  His EF was 40% at that  time.  I think it is unlikely that his chest pain was ischemically mediated however he does continue to smoke, has a family history as well as hypertension.  I am going to get a pharmacologic Myoview stress test to risk stratify him.  Is unable to exercise because of knee issues.  HTN (hypertension) History of essential hypertension her blood pressure measured today at 106/64.  Is on losartan and amlodipine.  Current meds at current dosing.  Cigarette smoker She is ongoing tobacco abuse of 5 or 6 cigarettes a day with COPD recalcitrant risk factor modification.  Sleep apnea History of obstructive sleep apnea on CPAP.      [name] MD FACP,FACC,FAHA, [facility] 07/01/2018 11:40 AM

## 2018-07-01 NOTE — Assessment & Plan Note (Signed)
History of obstructive sleep apnea on CPAP. 

## 2018-07-01 NOTE — Assessment & Plan Note (Signed)
History of essential hypertension her blood pressure measured today at 106/64.  Is on losartan and amlodipine.  Current meds at current dosing.

## 2018-07-01 NOTE — Assessment & Plan Note (Signed)
She is ongoing tobacco abuse of 5 or 6 cigarettes a day with COPD recalcitrant risk factor modification.

## 2018-07-01 NOTE — Assessment & Plan Note (Signed)
Robert Church was recently seen in the emergency room this past Monday with chest pain.  He had a heart catheterization performed by Dr. Tamala Julian 04/19/2017 which was entirely normal.  His EF was 40% at that time.  I think it is unlikely that his chest pain was ischemically mediated however he does continue to smoke, has a family history as well as hypertension.  I am going to get a pharmacologic Myoview stress test to risk stratify him.  Is unable to exercise because of knee issues.

## 2018-07-01 NOTE — Patient Instructions (Signed)
Medication Instructions:   NO CHANGE  Testing/Procedures:  Your physician has requested that you have a lexiscan myoview. For further information please visit [email or web address]. Please follow instruction sheet, as given.   Your physician has requested that you have an echocardiogram. Echocardiography is a painless test that uses sound waves to create images of your heart. It provides your doctor with information about the size and shape of your heart and how well your heart's chambers and valves are working. This procedure takes approximately one hour. There are no restrictions for this procedure.    Follow-Up:  Your physician wants you to follow-up in: 6 MONTHS WITH APP You will receive a reminder letter in the mail two months in advance. If you don't receive a letter, please call our office to schedule the follow-up appointment.   Your physician wants you to follow-up in: [place] will receive a reminder letter in the mail two months in advance. If you don't receive a letter, please call our office to schedule the follow-up appointment.   If you need a refill on your cardiac medications before your next appointment, please call your pharmacy.

## 2018-07-07 ENCOUNTER — Telehealth (HOSPITAL_COMMUNITY): Payer: Self-pay

## 2018-07-07 ENCOUNTER — Ambulatory Visit (HOSPITAL_COMMUNITY): Payer: BLUE CROSS/BLUE SHIELD | Attending: Cardiovascular Disease

## 2018-07-07 ENCOUNTER — Other Ambulatory Visit: Payer: Self-pay

## 2018-07-07 DIAGNOSIS — I77810 Thoracic aortic ectasia: Secondary | ICD-10-CM | POA: Diagnosis not present

## 2018-07-07 DIAGNOSIS — I119 Hypertensive heart disease without heart failure: Secondary | ICD-10-CM | POA: Insufficient documentation

## 2018-07-07 DIAGNOSIS — I428 Other cardiomyopathies: Secondary | ICD-10-CM | POA: Diagnosis not present

## 2018-07-07 DIAGNOSIS — Z8249 Family history of ischemic heart disease and other diseases of the circulatory system: Secondary | ICD-10-CM | POA: Insufficient documentation

## 2018-07-07 DIAGNOSIS — F17210 Nicotine dependence, cigarettes, uncomplicated: Secondary | ICD-10-CM | POA: Insufficient documentation

## 2018-07-07 NOTE — Telephone Encounter (Signed)
Encounter complete. 

## 2018-07-12 ENCOUNTER — Ambulatory Visit (HOSPITAL_COMMUNITY)
Admission: RE | Admit: 2018-07-12 | Payer: BLUE CROSS/BLUE SHIELD | Source: Ambulatory Visit | Attending: Cardiovascular Disease | Admitting: Cardiovascular Disease

## 2018-07-19 ENCOUNTER — Telehealth (HOSPITAL_COMMUNITY): Payer: Self-pay

## 2018-07-19 NOTE — Telephone Encounter (Signed)
Encounter complete. 

## 2018-07-21 ENCOUNTER — Ambulatory Visit (HOSPITAL_COMMUNITY)
Admission: RE | Admit: 2018-07-21 | Discharge: 2018-07-21 | Disposition: A | Discharge status: Home / Self Care | Payer: BLUE CROSS/BLUE SHIELD | Source: Ambulatory Visit | Attending: Cardiology | Admitting: Cardiology

## 2018-07-21 DIAGNOSIS — R072 Precordial pain: Secondary | ICD-10-CM | POA: Diagnosis not present

## 2018-07-21 MED ORDER — TECHNETIUM TC 99M TETROFOSMIN IV KIT
10.5000 | PACK | Freq: Once | INTRAVENOUS | Status: AC | PRN
  Administered 2018-07-21: 10.5 via INTRAVENOUS
  Filled 2018-07-21: qty 11

## 2018-07-21 MED ORDER — TECHNETIUM TC 99M TETROFOSMIN IV KIT
32.9000 | PACK | Freq: Once | INTRAVENOUS | Status: AC | PRN
Start: 2018-07-21 — End: 2018-07-21
  Administered 2018-07-21: 32.9 via INTRAVENOUS
  Filled 2018-07-21: qty 33

## 2018-07-21 MED ORDER — REGADENOSON 0.4 MG/5ML IV SOLN
0.4000 mg | Freq: Once | INTRAVENOUS | Status: AC
  Administered 2018-07-21: 0.4 mg via INTRAVENOUS

## 2018-07-22 LAB — MYOCARDIAL PERFUSION IMAGING
LV dias vol: 202 mL (ref 62–150)
LV sys vol: 125 mL
Peak HR: 100 {beats}/min
Rest HR: 76 {beats}/min
SDS: 7
SRS: 4
SSS: 11
TID: 1.1

## 2018-07-26 ENCOUNTER — Encounter: Payer: Self-pay | Admitting: Cardiovascular Disease

## 2018-07-26 ENCOUNTER — Ambulatory Visit: Payer: BLUE CROSS/BLUE SHIELD | Admitting: Cardiovascular Disease

## 2018-07-26 VITALS — BP 140/82 | HR 83 | Ht 70.0 in | Wt 220.0 lb

## 2018-07-26 DIAGNOSIS — R0600 Dyspnea, unspecified: Secondary | ICD-10-CM

## 2018-07-26 DIAGNOSIS — I200 Unstable angina: Secondary | ICD-10-CM

## 2018-07-26 DIAGNOSIS — R9439 Abnormal result of other cardiovascular function study: Secondary | ICD-10-CM

## 2018-07-26 MED ORDER — CARVEDILOL 3.125 MG PO TABS: 3.1250 mg | ORAL_TABLET | Freq: Two times a day (BID) | ORAL | 3 refills | Status: DC

## 2018-07-26 NOTE — Progress Notes (Signed)
Robert Church returns to see me for follow-up of his outpatient diagnostic test.  I saw him on 07/01/2018 because of chest pain.  He did have a normal cardiac catheterization performed by Dr. Tamala Julian 04/19/2017.  Myoview stress test performed 07/21/2018 showed decreased EF, inferior scar with septal ischemia.  A 2D echo performed 07/07/2018 revealed an EF of 30 to 35%.  Does complain of dyspnea on exertion.  I am going to get a coronary CTA to further evaluate.  I am going to stop his amlodipine and begin low-dose carvedilol and refer him to the advanced heart failure clinic for pharmacologic optimization.  He will also need to have a discussion regarding ICD implantation for primary prevention.  [name], M.D., [place], [facility], [place] [address] [place], [place]  [id number]  [phone] 07/26/2018 10:40 AM

## 2018-07-26 NOTE — Patient Instructions (Addendum)
Medication Instructions:  Your physician has recommended you make the following change in your medication:  1) STOP Norvasc  2) START Coreg 3.125 mg tablet by mouth TWICE daily   Labwork: Your physician recommends that you return for lab work in: 1 week before test   Testing/Procedures: Your physician has requested that you have cardiac CT. Cardiac computed tomography (CT) is a painless test that uses an x-ray machine to take clear, detailed pictures of your heart. For further information please visit [email or web address]. Please follow instruction sheet as given.    Follow-Up: You have been referred to [place] physician wants you to follow-up in: 6 months with Dr. Gwenlyn Found. You will receive a reminder letter in the mail two months in advance. If you don't receive a letter, please call our office to schedule the follow-up appointment.   Any Other Special Instructions Will Be Listed Below (If Applicable).   Please arrive at the [facility] main entrance of [facility] at _______________ AM (30-45 minutes prior to test start time)  [facility] [address] [place], [place] [address] [phone]  Proceed to the [facility] Radiology Department (First Floor).  Please follow these instructions carefully (unless otherwise directed):    On the Night Before the Test: . Drink plenty of water. . Do not consume any caffeinated/decaffeinated beverages or chocolate 12 hours prior to your test. . Do not take any antihistamines 12 hours prior to your test.  On the Day of the Test: . Drink plenty of water. Do not drink any water within one hour of the test. . Do not eat any food 4 hours prior to the test. . You may take your regular medications prior to the test.  After the Test: . Drink plenty of water. . After receiving IV contrast, you may experience a mild flushed feeling. This is normal. . On occasion, you may experience a mild  rash up to 24 hours after the test. This is not dangerous. If this occurs, you can take Benadryl 25 mg and increase your fluid intake. . If you experience trouble breathing, this can be serious. If it is severe call 911 IMMEDIATELY. If it is mild, please call our office. . If you take any of these medications: Glipizide/Metformin, Avandament, Glucavance, please do not take 48 hours after completing test.    If you need a refill on your cardiac medications before your next appointment, please call your pharmacy.

## 2018-07-26 NOTE — Assessment & Plan Note (Signed)
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  I am going to discontinue his amlodipine and begin low-dose carvedilol.  I am referring him to the heart failure clinic for further evaluation and pharmacologic optimization as well as discussion regarding ICD implantation for primary prevention.  I will see him back in 6 months.

## 2018-07-26 NOTE — Assessment & Plan Note (Signed)
Robert Church returns for follow-up of his Myoview stress test performed to evaluate chest pain on 07/21/2018.  This did show a diaphragmatic attenuation versus inferior scar and septal ischemia.  He had a cardiac cath performed April of last year by Dr. Tamala Julian which was entirely normal.  It is unlikely that he would have developed significant CAD in the interim although I am going to get a coronary CTA to further evaluate.

## 2018-07-28 ENCOUNTER — Telehealth (HOSPITAL_COMMUNITY): Payer: Self-pay | Admitting: Internal Medicine

## 2018-07-28 NOTE — Telephone Encounter (Signed)
Patient returned my call and is aware of 09/26/18 appt.  New pt packet mailed to pt

## 2018-07-28 NOTE — Telephone Encounter (Signed)
Called and left message for patient to call back.  Need to give patient New CHF appt info for 09/26/18 at 11:20 am with Dr. Haroldine Laws.

## 2018-08-10 ENCOUNTER — Ambulatory Visit: Payer: BLUE CROSS/BLUE SHIELD | Admitting: Cardiovascular Disease

## 2018-08-24 ENCOUNTER — Telehealth (HOSPITAL_COMMUNITY): Payer: Self-pay | Admitting: Cardiology

## 2018-08-24 LAB — BASIC METABOLIC PANEL
BUN/Creatinine Ratio: 17 (ref 10–24)
BUN: 14 mg/dL (ref 8–27)
CO2: 25 mmol/L (ref 20–29)
Calcium: 9.4 mg/dL (ref 8.6–10.2)
Chloride: 96 mmol/L (ref 96–106)
Creatinine, Ser: 0.81 mg/dL (ref 0.76–1.27)
GFR calc Af Amer: 112 mL/min/{1.73_m2} (ref >59)
GFR calc non Af Amer: 97 mL/min/{1.73_m2} (ref >59)
Glucose: 167 mg/dL — ABNORMAL HIGH (ref 65–99)
Potassium: 3.9 mmol/L (ref 3.5–5.2)
Sodium: 139 mmol/L (ref 134–144)

## 2018-08-24 NOTE — Telephone Encounter (Signed)
Patient can either add on to Advanced Practitioners schedule for fluid check or call Dr Kennon Holter office for add on  Patient would like to wait on cardiac ct results. Aware he should continue to monitor symptoms. Advised if symptoms get worse he should report to ER for further evaluation.

## 2018-08-24 NOTE — Telephone Encounter (Signed)
Patients mother called to report her concerns for her son, reports he has increased abdominal swelling, increased SOB, was sent home early from work due to increased SOB breathing treatment x2 with minimal relief, reports she saw him on 9/2 and he did not look like himself at all   Advised I would reach out to patient to verify/ review symptoms   LMOM for patient

## 2018-08-24 NOTE — Telephone Encounter (Signed)
Patient returned call to report increased SOB, increased LE edema, mild CP and increased fatigue.  Reports above symptoms have increased over the past 2 weeks.  Weight stable at 216 Scheduled for cardiac CT on 9/6

## 2018-08-25 ENCOUNTER — Telehealth: Payer: Self-pay | Admitting: Cardiovascular Disease

## 2018-08-25 ENCOUNTER — Encounter: Payer: Self-pay | Admitting: *Deleted

## 2018-08-25 NOTE — Telephone Encounter (Signed)
Spoke with pt, ct instructions discussed in detail with the patient.

## 2018-08-25 NOTE — Telephone Encounter (Signed)
New message:       Pt is calling and requesting instructions for his CT tomorrow he stated someone called but he can not remember what he can and can not eat/drink

## 2018-08-26 ENCOUNTER — Ambulatory Visit (HOSPITAL_COMMUNITY)
Admission: RE | Admit: 2018-08-26 | Discharge: 2018-08-26 | Disposition: A | Discharge status: Home / Self Care | Payer: BLUE CROSS/BLUE SHIELD | Source: Ambulatory Visit | Attending: Cardiovascular Disease | Admitting: Cardiovascular Disease

## 2018-08-26 ENCOUNTER — Encounter (HOSPITAL_COMMUNITY): Payer: Self-pay

## 2018-08-26 ENCOUNTER — Telehealth: Payer: Self-pay | Admitting: Cardiovascular Disease

## 2018-08-26 DIAGNOSIS — I200 Unstable angina: Secondary | ICD-10-CM

## 2018-08-26 DIAGNOSIS — R9439 Abnormal result of other cardiovascular function study: Secondary | ICD-10-CM | POA: Diagnosis present

## 2018-08-26 DIAGNOSIS — R0600 Dyspnea, unspecified: Secondary | ICD-10-CM | POA: Insufficient documentation

## 2018-08-26 DIAGNOSIS — I25110 Atherosclerotic heart disease of native coronary artery with unstable angina pectoris: Secondary | ICD-10-CM | POA: Insufficient documentation

## 2018-08-26 DIAGNOSIS — I281 Aneurysm of pulmonary artery: Secondary | ICD-10-CM | POA: Insufficient documentation

## 2018-08-26 DIAGNOSIS — I700 Atherosclerosis of aorta: Secondary | ICD-10-CM | POA: Insufficient documentation

## 2018-08-26 MED ORDER — IOPAMIDOL (ISOVUE-370) INJECTION 76%
100.0000 mL | Freq: Once | INTRAVENOUS | Status: AC | PRN
  Administered 2018-08-26: 80 mL via INTRAVENOUS

## 2018-08-26 MED ORDER — NITROGLYCERIN 0.4 MG SL SUBL
0.8000 mg | SUBLINGUAL_TABLET | SUBLINGUAL | Status: DC | PRN
  Filled 2018-08-26: qty 25

## 2018-08-26 MED ORDER — METOPROLOL TARTRATE 5 MG/5ML IV SOLN
INTRAVENOUS | Status: AC
  Administered 2018-08-26: 10 mg via INTRAVENOUS
  Filled 2018-08-26: qty 20

## 2018-08-26 MED ORDER — METOPROLOL TARTRATE 5 MG/5ML IV SOLN
10.0000 mg | INTRAVENOUS | Status: DC | PRN
  Administered 2018-08-26 (×2): 10 mg via INTRAVENOUS
  Filled 2018-08-26 (×2): qty 10

## 2018-08-26 MED ORDER — NITROGLYCERIN 0.4 MG SL SUBL
SUBLINGUAL_TABLET | SUBLINGUAL | Status: AC
Start: 2018-08-26 — End: 2018-08-26
  Administered 2018-08-26: 0.4 mg
  Filled 2018-08-26: qty 2

## 2018-08-26 NOTE — Telephone Encounter (Signed)
Patient called w/lab & coronary CT results  He reports he was unable to tolerate carvedilol 3.125mg  BID. He reports excessive fatigue. He returned to taking amlodipine. He reports his BP is good in the 110s-120s.   Advised patient I would notify MD. He has HF clinic appt on 10/7

## 2018-08-26 NOTE — Telephone Encounter (Signed)
° ° °  Patient calling for lab and ct results

## 2018-09-26 ENCOUNTER — Ambulatory Visit (HOSPITAL_COMMUNITY)
Admission: RE | Admit: 2018-09-26 | Discharge: 2018-09-26 | Disposition: A | Discharge status: Home / Self Care | Payer: BLUE CROSS/BLUE SHIELD | Source: Ambulatory Visit | Attending: Internal Medicine | Admitting: Internal Medicine

## 2018-09-26 ENCOUNTER — Encounter (HOSPITAL_COMMUNITY): Payer: Self-pay | Admitting: Internal Medicine

## 2018-09-26 ENCOUNTER — Other Ambulatory Visit: Payer: Self-pay

## 2018-09-26 VITALS — BP 148/100 | HR 84 | Wt 225.5 lb

## 2018-09-26 DIAGNOSIS — G4730 Sleep apnea, unspecified: Secondary | ICD-10-CM

## 2018-09-26 DIAGNOSIS — I4510 Unspecified right bundle-branch block: Secondary | ICD-10-CM | POA: Insufficient documentation

## 2018-09-26 DIAGNOSIS — G4733 Obstructive sleep apnea (adult) (pediatric): Secondary | ICD-10-CM | POA: Diagnosis not present

## 2018-09-26 DIAGNOSIS — I110 Hypertensive heart disease with heart failure: Secondary | ICD-10-CM | POA: Insufficient documentation

## 2018-09-26 DIAGNOSIS — Z7982 Long term (current) use of aspirin: Secondary | ICD-10-CM | POA: Diagnosis not present

## 2018-09-26 DIAGNOSIS — I453 Trifascicular block: Secondary | ICD-10-CM | POA: Diagnosis not present

## 2018-09-26 DIAGNOSIS — Z881 Allergy status to other antibiotic agents status: Secondary | ICD-10-CM | POA: Diagnosis not present

## 2018-09-26 DIAGNOSIS — Z880 Allergy status to penicillin: Secondary | ICD-10-CM | POA: Insufficient documentation

## 2018-09-26 DIAGNOSIS — Z79899 Other long term (current) drug therapy: Secondary | ICD-10-CM | POA: Diagnosis not present

## 2018-09-26 DIAGNOSIS — I5022 Chronic systolic (congestive) heart failure: Secondary | ICD-10-CM | POA: Insufficient documentation

## 2018-09-26 DIAGNOSIS — J449 Chronic obstructive pulmonary disease, unspecified: Secondary | ICD-10-CM | POA: Diagnosis not present

## 2018-09-26 DIAGNOSIS — I428 Other cardiomyopathies: Secondary | ICD-10-CM | POA: Insufficient documentation

## 2018-09-26 DIAGNOSIS — I10 Essential (primary) hypertension: Secondary | ICD-10-CM

## 2018-09-26 DIAGNOSIS — F17210 Nicotine dependence, cigarettes, uncomplicated: Secondary | ICD-10-CM

## 2018-09-26 MED ORDER — SACUBITRIL-VALSARTAN 97-103 MG PO TABS: 1.0000 | ORAL_TABLET | Freq: Two times a day (BID) | ORAL | 6 refills | Status: DC

## 2018-09-26 NOTE — Progress Notes (Signed)
PA completed for pt's Entresto through [place], med approved 09/26/18 through 12/20/38.  Patient aware and given 30 day free card and copay asst card

## 2018-09-26 NOTE — Patient Instructions (Signed)
Stop Losartan  Start Entresto 97/103 mg Twice daily   Make sure to take your Furosemide (Lasix) every day  Labs in 1 week  Your physician has recommended that you have a sleep study. This test records several body functions during sleep, including: brain activity, eye movement, oxygen and carbon dioxide blood levels, heart rate and rhythm, breathing rate and rhythm, the flow of air through your mouth and nose, snoring, body muscle movements, and chest and belly movement.  Please follow up with Doroteo Bradford, our heart failure pharmacist every 2 weeks for 3 visits  Your physician recommends that you schedule a follow-up appointment in: 3 months

## 2018-09-26 NOTE — Progress Notes (Signed)
ADVANCED HF CLINIC CONSULT NOTE  Referring Physician: Primary Care: Primary Cardiologist:  HPI:  [name] is a 62 y/o male with h/o obesity, HTN, COPD with ongoing tobacco use and chronic systolic HF referred by Dr. Gwenlyn Found for further management of his HF.   He underwent cath in 4/18 due to CP. Cath showed normal coronaries with EF 40% and global HK. LVEDP 19. He was referred for a sleep study.   He saw Dr. Gwenlyn Found on 07/01/2018 because of recurrent chest pain.  Myoview stress test performed 07/21/2018 showed EF 38% inferior scar with septal ischemia.  A 2D echo performed 07/07/2018 revealed an EF of 30 to 35%.  He was referred for cardiac CT.   Cardiac CT on 08/26/18.  Calcium score 148 (74th percentile)  LAD 25% otherwise normal cors. Dilated pulmonary arteries suggestive of PH.   Works for [employer]. Has been smoking for a long time. Previously 10-15 cigs per day - now about 5 cig per day.  Follows with Dr. Girard Cooter at [facility]. He is on multiple inhalers which help some. Wears CPAP most nights but hard for him to wear it and only gets 2-3 hours per night. Says CPAP prescribed by Dr. Lisbeth Ply but doesn't have a sleep doctor. Walks about 30-40 feet before he has to stop due to SOB and knee pain. No claudication. Says it has gotten a little worse. Has LE edema. Takes fluid tablet about every other day. Works well when he takes it. BP high in the am but better at night. Weight up 10 pounds in setting of recent prednisone taper. Previously failed b-blocker due to severe fatigue - "I couldn't put 1 foot in front of the other."  PFTs 3/18 FEV1 2.54 (72%) FVC   3.48 (75%) DLCO 107%   Review of Systems: [y] = yes, [ ]  = no   General: Weight gain [ y]; Weight loss [ ] ; Anorexia [ ] ; Fatigue Blue.Reese ]; Fever [ ] ; Chills [ ] ; Weakness [ ]   Cardiac: Chest pain/pressure [ ] ; Resting SOB [ ] ; Exertional SOB [ y]; Orthopnea [ ] ; Pedal Edema Blue.Reese ]; Palpitations [ ] ; Syncope [ ] ; Presyncope [ ] ;  Paroxysmal nocturnal dyspnea[ ]   Pulmonary: Cough [ ] ; Wheezing[ ] ; Hemoptysis[ ] ; Sputum [ ] ; Snoring [ ]   GI: Vomiting[ ] ; Dysphagia[ ] ; Melena[ ] ; Hematochezia [ ] ; Heartburn[ ] ; Abdominal pain [ ] ; Constipation [ ] ; Diarrhea [ ] ; BRBPR [ ]   GU: Hematuria[ ] ; Dysuria [ ] ; Nocturia[ ]   Vascular: Pain in legs with walking [ y]; Pain in feet with lying flat [ ] ; Non-healing sores [ ] ; Stroke [ ] ; TIA [ ] ; Slurred speech [ ] ;  Neuro: Headaches[ ] ; Vertigo[ ] ; Seizures[ ] ; Paresthesias[ ] ;Blurred vision [ ] ; Diplopia [ ] ; Vision changes [ ]   Ortho/Skin: Arthritis [ y]; Joint pain Blue.Reese ]; Muscle pain [ ] ; Joint swelling [ ] ; Back Pain [ ] ; Rash [ ]   Psych: Depression[ ] ; Anxiety[ ]   Heme: Bleeding problems [ ] ; Clotting disorders [ ] ; Anemia [ ]   Endocrine: Diabetes [ ] ; Thyroid dysfunction[ ]    Past Medical History:  Diagnosis Date  . Allergy   . Asthma   . Bronchitis   . COPD (chronic obstructive pulmonary disease) ([place])   . GERD (gastroesophageal reflux disease)   . Hypertension   . Pneumonia   . Sinusitis   . SOB (shortness of breath)     Current Outpatient Medications  Medication Sig Dispense Refill  .  albuterol (PROVENTIL HFA;VENTOLIN HFA) 108 (90 BASE) MCG/ACT inhaler Inhale 2 puffs into the lungs every 6 (six) hours as needed for wheezing. 1 Inhaler 11  . albuterol (PROVENTIL) (2.5 MG/3ML) 0.083% nebulizer solution Take 3 mLs (2.5 mg total) by nebulization every 6 (six) hours as needed for wheezing or shortness of breath. 75 mL 12  . allopurinol (ZYLOPRIM) 100 MG tablet Take 200 mg by mouth daily.    Marland Kitchen ALPRAZolam (XANAX) 1 MG tablet Take 0.5-2 mg by mouth See admin instructions. 0.5 mg in the morning and afternoon and take 2mg  at bedtime    . amLODipine (NORVASC) 10 MG tablet Take 10 mg by mouth daily.    Marland Kitchen aspirin EC 81 MG tablet Take 1 tablet (81 mg total) by mouth daily. 90 tablet 3  . Aspirin-Salicylamide-Caffeine (BC FAST PAIN RELIEF) 650-195-33.3 MG PACK Take 1 packet by  mouth every 8 (eight) hours as needed (for knee pain.).    . Azelastine-Fluticasone 137-50 MCG/ACT SUSP 1 spray each nostril twice daily 23 g 6  . cholecalciferol (VITAMIN D) 1000 units tablet Take 1,000 Units by mouth daily.    . furosemide (LASIX) 20 MG tablet Take 20 mg by mouth every morning.     Marland Kitchen losartan (COZAAR) 100 MG tablet Take 100 mg by mouth daily.    . mometasone-formoterol (DULERA) 200-5 MCG/ACT AERO Take 2 puffs first thing in am and then another 2 puffs about 12 hours later. 1 Inhaler 11  . omeprazole (PRILOSEC) 20 MG capsule Take 20 mg by mouth 2 (two) times daily. ACID REFLUX    . spironolactone (ALDACTONE) 25 MG tablet Take 25 mg by mouth daily.    . Testosterone 30 MG/ACT SOLN Apply 1 application topically daily. Apply under each arm daily    . Tiotropium Bromide Monohydrate (SPIRIVA RESPIMAT) 2.5 MCG/ACT AERS Inhale 2.5 mcg into the lungs 2 (two) times daily.     No current facility-administered medications for this encounter.     Allergies  Allergen Reactions  . Clindamycin Other (See Comments)    Tongue swelling  . E-Mycin [Erythromycin Base] Swelling  . Penicillins Swelling and Other (See Comments)    Childhood rxn--MD stated he "almost died" Has patient had a PCN reaction causing immediate rash, facial/tongue/throat swelling, SOB or lightheadedness with hypotension:Yes Has patient had a PCN reaction causing severe rash involving mucus membranes or skin necrosis:unsure Has patient had a PCN reaction that required hospitalization:unsure Has patient had a PCN reaction occurring within the last 10 years:No If all of the above answers are "NO", then may proceed with Cephalosporin use.    . Rosuvastatin     Other reaction(s): Myalgias (intolerance)      Social History   Socioeconomic History  . Marital status: Single    Spouse name: Not on file  . Number of children: Not on file  . Years of education: Not on file  . Highest education level: Not on file   Occupational History  . Not on file  Social Needs  . Financial resource strain: Not on file  . Food insecurity:    Worry: Not on file    Inability: Not on file  . Transportation needs:    Medical: Not on file    Non-medical: Not on file  Tobacco Use  . Smoking status: Light Tobacco Smoker    Packs/day: 0.25    Years: 46.00    Pack years: 11.50    Types: Cigarettes    Last attempt to quit: 10/05/2016  Years since quitting: 1.9  . Smokeless tobacco: Never Used  . Tobacco comment: 5 cigs qd (07/01/16)  Substance and Sexual Activity  . Alcohol use: No    Alcohol/week: 0.0 standard drinks  . Drug use: No  . Sexual activity: Yes    Birth control/protection: None  Lifestyle  . Physical activity:    Days per week: Not on file    Minutes per session: Not on file  . Stress: Not on file  Relationships  . Social connections:    Talks on phone: Not on file    Gets together: Not on file    Attends religious service: Not on file    Active member of club or organization: Not on file    Attends meetings of clubs or organizations: Not on file    Relationship status: Not on file  . Intimate partner violence:    Fear of current or ex partner: Not on file    Emotionally abused: Not on file    Physically abused: Not on file    Forced sexual activity: Not on file  Other Topics Concern  . Not on file  Social History Narrative  . Not on file      Family History  Problem Relation Age of Onset  . Lung cancer Father   . Brain cancer Father     Vitals:   09/26/18 1119  BP: (!) 148/100  Pulse: 84  SpO2: 94%  Weight: 102.3 kg (225 lb 8 oz)    PHYSICAL EXAM: General:  Walked into clinic  No respiratory difficulty HEENT: normal Neck: supple. JVP 8-9. Carotids 2+ bilat; no bruits. No lymphadenopathy or thryomegaly appreciated. Cor: PMI nondisplaced. Regular rate & rhythm. No rubs, gallops or murmurs. Lungs: mildly reduced BS throughout mild EE wheeze Abdomen: obese soft,  nontender, nondistended. No hepatosplenomegaly. No bruits or masses. Good bowel sounds. Extremities: no cyanosis, clubbing, rash, 3+ edema Neuro: alert & oriented x 3, cranial nerves grossly intact. moves all 4 extremities w/o difficulty. Affect pleasant.  ECG: NSR 81 1AVB 237ms, RBBB, LPFB (trifascicular block) No ST-T wave abnormalities. Personally reviewed    ASSESSMENT & PLAN:  1. Chronic systolic HF due to NICM  - cath 4/18 normal cors. EF 48%  - Myoview 07/21/2018 showed EF 38% inferior scar with septal ischemia.   - Echo 07/07/2018 revealed EF 30-35%. RV normal.  - Cardiac CT 08/26/18.  Calcium score 148 (74th percentile)  LAD 25% otherwise normal cors. Dilated pulmonary arteries suggestive of PH.  - Suspect NICM due to HTN and inadequately treated OSA - He is NYHA II-III due to a combination of HF, arthirtis and COPD though PFTs not that bad - Currently volume overloaded in setting of skipping lasix doses - Suspect compliance a major issue for him - Will switch losartan to Entresto 97/103 bid - Continue Arlyce Harman - Stressed need to take lasix every day and double up as needed - has failed b-blocker in past. Can consider trial of bisoprolol in future but doubt he will tolerate as fatigue was major issue and not wheezing - Will refer to HF PharmD for further med titration  - Repeat echo in 3-4 months. If EF still <= 35% consider cMRI and ICD  2. HTN - Poorly controlled - Switch losartan to [facility] for now  3. COPD with ongoing tobacco use - Encouraged cessation. Follows with Pulmonary at [facility]  4. OSA - inadequately treated. Refer to Dr. Claiborne Billings to help with CPAP complaince  5. Morbid obesity  -  stressed need for weight loss  6. Trifascicular block - Followed by Dr. Sanda Klein, MD  11:49 AM

## 2018-09-28 NOTE — Telephone Encounter (Signed)
Medications discussed at heart failure appointment.

## 2018-09-29 ENCOUNTER — Telehealth (HOSPITAL_COMMUNITY): Payer: Self-pay

## 2018-09-29 NOTE — Telephone Encounter (Signed)
Please let him know that 98/53 is OK as long as he feels OK (Is not lightheadedness or dizzy)  Though, he should probably space out his Entresto more. Ideally would take ~ 12 hrs apart. So am and pm. He can continue to take his amlodipine at bedtime.

## 2018-09-29 NOTE — Telephone Encounter (Signed)
Notified pt and he is agreeable to plan. Pt verbalizes understanding.

## 2018-09-29 NOTE — Telephone Encounter (Signed)
Pt called and stated that he is currently taking Entresto 97-103 mg 1 tab twice daily and amlodipine 10 mg once daily. Pt states that he takes 1 tab Entresto in the am and 1 at lunch, his amlodipine is taken before bed at night. Pt is worried because when he takes the Arion, his bp is dropping. Last night bp was 98/53 so he did not take his amlodipine. Pt checked his bp this morning and it was 127/79. Please advise.

## 2018-09-30 NOTE — Telephone Encounter (Signed)
He can cut amlodipine to 5 mg daily (in half).  This medicine is JUST for BP. Delene Loll is more important as will also make his heart stronger.    [name] [address]" [place], PA-C 09/30/2018 11:23 AM

## 2018-09-30 NOTE — Telephone Encounter (Signed)
Pt notified and verbalizes understanding.

## 2018-09-30 NOTE — Telephone Encounter (Signed)
Pt called back stating that he did as you said and took entresto yesterday 12 hours apart and his bp was 99/58 at 11pm on 10/10. Pt did not take his amlodipine due to his bp being low.Pt reports no much dizziness or lightheadedness. Pt states that he drinks a couple of beers everyday and is concerned of this has something to do with it.

## 2018-10-04 ENCOUNTER — Telehealth (HOSPITAL_COMMUNITY): Payer: Self-pay | Admitting: Vascular Surgery

## 2018-10-04 NOTE — Telephone Encounter (Signed)
Left pt message giving sleep study appt @ [facility] 10//22, asked pt to call back to confirm appt

## 2018-10-10 ENCOUNTER — Ambulatory Visit (HOSPITAL_COMMUNITY)
Admission: RE | Admit: 2018-10-10 | Discharge: 2018-10-10 | Disposition: A | Discharge status: Home / Self Care | Payer: BLUE CROSS/BLUE SHIELD | Source: Ambulatory Visit | Attending: Internal Medicine | Admitting: Internal Medicine

## 2018-10-10 DIAGNOSIS — Z720 Tobacco use: Secondary | ICD-10-CM | POA: Insufficient documentation

## 2018-10-10 DIAGNOSIS — J449 Chronic obstructive pulmonary disease, unspecified: Secondary | ICD-10-CM | POA: Diagnosis not present

## 2018-10-10 DIAGNOSIS — I453 Trifascicular block: Secondary | ICD-10-CM | POA: Insufficient documentation

## 2018-10-10 DIAGNOSIS — I428 Other cardiomyopathies: Secondary | ICD-10-CM | POA: Insufficient documentation

## 2018-10-10 DIAGNOSIS — I5022 Chronic systolic (congestive) heart failure: Secondary | ICD-10-CM | POA: Insufficient documentation

## 2018-10-10 DIAGNOSIS — Z79899 Other long term (current) drug therapy: Secondary | ICD-10-CM | POA: Insufficient documentation

## 2018-10-10 DIAGNOSIS — G4733 Obstructive sleep apnea (adult) (pediatric): Secondary | ICD-10-CM | POA: Diagnosis not present

## 2018-10-10 DIAGNOSIS — I110 Hypertensive heart disease with heart failure: Secondary | ICD-10-CM | POA: Diagnosis not present

## 2018-10-10 MED ORDER — BISOPROLOL FUMARATE 5 MG PO TABS: 2.5000 mg | ORAL_TABLET | Freq: Every day | ORAL | 5 refills | Status: DC

## 2018-10-10 NOTE — Patient Instructions (Signed)
It was great to meet you today!  Please START bisoprolol 2.5 mg (1/2 tablet) DAILY.   You are scheduled to see the pharmacist again on 10/24/18.

## 2018-10-10 NOTE — Progress Notes (Signed)
HF MD: BENSIMHON  HPI:  [name] is a 62 y/o male with h/o obesity, HTN, COPD with ongoing tobacco use and chronic systolic HF referred by Dr. Gwenlyn Found for further management of his HF.   He underwent cath in 4/18 due to CP. Cath showed normal coronaries with EF 40% and global HK. LVEDP 19. He was referred for a sleep study.   He saw Dr. Gwenlyn Found on 07/01/2018 because of recurrent chest pain. Myoview stress test performed 07/21/2018 showed EF 38% inferior scar with septal ischemia. A 2D echo performed 07/07/2018 revealed an EF of 30 to 35%. He was referred for cardiac CT.   Cardiac CT on 08/26/18.  Calcium score 148 (74th percentile)  LAD 25% otherwise normal cors. Dilated pulmonary arteries suggestive of PH.   Patient presents today for pharmacist-led HF medication titration. At last HF clinic visit on 10/7, his losartan was switched to Entresto 97-103 mg BID. He did have some episodes of lower BP and dizziness after this change so his amlodipine was decreased to 5 mg daily. He feels somewhat better since this change was made but still having some episodes of dizziness. Works for [employer]. Has been smoking for a long time. Previously 10-15 cigs per day - now about 4-5 cig per day.  Follows with Dr. Girard Cooter at [facility]. He is on multiple inhalers which help some. Wears CPAP most nights but hard for him to wear it and only gets 2-3 hours per night. Says CPAP prescribed by Dr. Lisbeth Ply but doesn't have a sleep doctor. Walks about 30-40 feet before he has to stop due to SOB and knee pain. Previously failed b-blocker due to severe fatigue - "I couldn't put 1 foot in front of the other."    . Shortness of breath/dyspnea on exertion? Yes - stable  . Orthopnea/PND? no . Edema? Yes - 1+ . Lightheadedness/dizziness? Yes - random episodes . Daily weights at home? Yes - stable ~223 lb . Blood pressure/heart rate monitoring at home? Yes - 130/78 mmHg>>106/50 mmHg . Following  low-sodium/fluid-restricted diet? Yes   HF Medications: Furosemide 20 mg PO daily Entresto 97-103 mg PO BID Spironolactone 25 mg PO daily   Has the patient been experiencing any side effects to the medications prescribed?  no  Does the patient have any problems obtaining medications due to transportation or finances?   No - [employer] commercial   Understanding of regimen: fair Understanding of indications: fair Potential of compliance: good Patient understands to avoid NSAIDs. Patient understands to avoid decongestants.    Pertinent Lab Values: . 10/04/18: Serum creatinine 0.78, BUN 15, Potassium 4.7, Sodium 140  Vital Signs: . Weight: 223 lb (dry weight: 220-225 lb) . Blood pressure: 154/98 mmHg  . Heart rate: 81 bpm   ReDS Vest - 36%  Assessment: 1. Chronic systolic CHF (EF 01>>60-10%), due to NICM (?HTN/OSA). NYHA class II-III symptoms.  - Volume status stable   - Start bisoprolol 2.5 mg daily cautiously since fatigue was a major issue on BB in the past  - Continue furosemide 20 mg daily, Entresto 97-103 mg BID and spironolactone 25 mg daily  - Repeat echo in 3-4 months. If EF still <= 35% consider cMRI and ICD  - Basic disease state pathophysiology, medication indication, mechanism and side effects reviewed at length with patient and he verbalized understanding  2. HTN - Remains above goal of <130/90 - Starting bisoprolol today  - May need to consider increasing amlodipine again   3. COPD with ongoing  tobacco use - Encouraged cessation. Follows with Pulmonary at [facility] - Would like to continue to reduce amount smoking on his own  4. OSA - inadequately treated. Refer to Dr. Claiborne Billings to help with CPAP complaince  5. Morbid obesity  - Congratulated on 2 lb weight loss but encouraged continued portion control  6. Trifascicular block - Followed by Dr. Gwenlyn Found   Plan: 1) Medication changes: Based on clinical presentation, vital signs and recent labs will start  bisoprolol 2.5 mg daily 2) Labs: PRN 3) Follow-up: Pharmacy visit on 11/4 and 11/18 and Dr. Haroldine Laws in 3 months   [place]. Velva Harman, PharmD, BCPS, CPP Clinical Pharmacist Phone: [phone] 10/10/2018 9:11 AM

## 2018-10-19 ENCOUNTER — Telehealth (HOSPITAL_COMMUNITY): Payer: Self-pay | Admitting: Pharmacist

## 2018-10-19 MED ORDER — LOSARTAN POTASSIUM 100 MG PO TABS: 100.0000 mg | ORAL_TABLET | Freq: Every day | ORAL | 5 refills | Status: DC

## 2018-10-19 NOTE — Telephone Encounter (Signed)
Robert Church called stating that ever since starting [facility], he has had diarrhea and nausea. He decided not to take his Entresto yesterday and restarted his losartan instead and has not had any nausea or diarrhea today. I have advised him to continue the losartan for now and will reassess at our visit together next Monday.   Ruta Hinds. Velva Harman, PharmD, BCPS, CPP Clinical Pharmacist Phone: [phone] 10/19/2018 1:48 PM

## 2018-10-24 ENCOUNTER — Ambulatory Visit (HOSPITAL_COMMUNITY)
Admission: RE | Admit: 2018-10-24 | Discharge: 2018-10-24 | Disposition: A | Discharge status: Home / Self Care | Payer: BLUE CROSS/BLUE SHIELD | Source: Ambulatory Visit | Attending: Cardiology | Admitting: Cardiology

## 2018-10-24 VITALS — BP 154/96 | HR 74 | Wt 221.4 lb

## 2018-10-24 DIAGNOSIS — I110 Hypertensive heart disease with heart failure: Secondary | ICD-10-CM | POA: Insufficient documentation

## 2018-10-24 DIAGNOSIS — J449 Chronic obstructive pulmonary disease, unspecified: Secondary | ICD-10-CM | POA: Insufficient documentation

## 2018-10-24 DIAGNOSIS — I5022 Chronic systolic (congestive) heart failure: Secondary | ICD-10-CM | POA: Diagnosis present

## 2018-10-24 DIAGNOSIS — I453 Trifascicular block: Secondary | ICD-10-CM | POA: Diagnosis not present

## 2018-10-24 DIAGNOSIS — I10 Essential (primary) hypertension: Secondary | ICD-10-CM

## 2018-10-24 DIAGNOSIS — G4733 Obstructive sleep apnea (adult) (pediatric): Secondary | ICD-10-CM | POA: Diagnosis not present

## 2018-10-24 LAB — BASIC METABOLIC PANEL
Anion gap: 8 (ref 5–15)
BUN: 17 mg/dL (ref 6–20)
CO2: 27 mmol/L (ref 22–32)
Calcium: 9.1 mg/dL (ref 8.9–10.3)
Chloride: 102 mmol/L (ref 98–111)
Creatinine, Ser: 1.01 mg/dL (ref 0.61–1.24)
GFR calc Af Amer: >60 mL/min (ref >60)
GFR calc non Af Amer: >60 mL/min (ref >60)
Glucose, Bld: 123 mg/dL — ABNORMAL HIGH (ref 70–99)
Potassium: 3.9 mmol/L (ref 3.5–5.1)
Sodium: 137 mmol/L (ref 135–145)

## 2018-10-24 MED ORDER — SACUBITRIL-VALSARTAN 24-26 MG PO TABS: 1.0000 | ORAL_TABLET | Freq: Two times a day (BID) | ORAL | 5 refills | Status: DC

## 2018-10-24 MED ORDER — BISOPROLOL FUMARATE 5 MG PO TABS: 5.0000 mg | ORAL_TABLET | Freq: Every day | ORAL | 5 refills | Status: DC

## 2018-10-24 NOTE — Patient Instructions (Signed)
Please STOP losartan and START Entresto 24-26 mg (1 TABLET) TWICE DAILY.   Please INCREASE bisoprolol to 1 tablet (5 mg) ONCE DAILY.   Blood work today. We will call you with any changes.   You are scheduled with the pharmacist again on 11/07/18.

## 2018-10-24 NOTE — Progress Notes (Signed)
HF MD: BENSIMHON  HPI:  Robert Church a 62 y/o male with h/o obesity, HTN, COPD with ongoing tobacco use and chronic systolic HF referred by Dr. Gwenlyn Found for further management of his HF.   He underwent cath in 4/18 due to CP. Cath showed normal coronaries with EF 40% and global HK. LVEDP 19. He was referred for a sleep study.  He saw Dr. Gwenlyn Found on 07/01/2018 because ofrecurrentchest pain. Myoview stress test performed 07/21/2018 showed EF38%inferior scar with septal ischemia. A 2D echo performed 07/07/2018 revealed an EF of 30 to 35%.He was referred for cardiac CT.   Cardiac CT on 08/26/18. Calcium score 148 (74th percentile) LAD 25% otherwise normal cors. Dilated pulmonary arteries suggestive of PH.   Patient presents today for pharmacist-led HF medication titration. At last HF clinic visit on 10/21, he was started on bisoprolol 2.5 mg daily. He did call me on 10/19/18 stating that ever since starting Entresto 97-103 mg BID he has had diarrhea and nausea so he stopped Entresto and restarted losartan 100 mg daily and his symptoms have resolved. Works for [employer]. Has been smoking for a long time. Previously 10-15 cigs per day - now about 4-5 cig per day. Follows with Dr. Girard Cooter at [facility]. He is on multiple inhalers which help some. Wears CPAP most nights but hard for him to wear it and only gets 2-3 hours per night. Says CPAP prescribed by Dr. Lisbeth Ply but doesn't have a sleep doctor. Walks about 30-40 feet before he has to stop due to SOB and knee pain. Previously failed b-blocker due to severe fatigue - "I couldn't put 1 foot in front of the other."   Shortness of breath/dyspnea on exertion? Yes - stable   Orthopnea/PND? no  Edema? Yes - stable at 1+  Lightheadedness/dizziness? Yes - random episodes  Daily weights at home? Yes - stable ~221 lb  Blood pressure/heart rate monitoring at home? Yes - 130/78 mmHg>>106/50 mmHg  Following low-sodium/fluid-restricted  diet? Yes   HF Medications: Bisoprolol 2.5 mg PO daily  Furosemide 20 mg PO daily Losartan 100 mg PO daily Spironolactone 25 mg PO daily   Has the patient been experiencing any side effects to the medications prescribed?  no  Does the patient have any problems obtaining medications due to transportation or finances?   No - [employer] commercial   Understanding of regimen: fair Understanding of indications: fair Potential of compliance: good Patient understands to avoid NSAIDs. Patient understands to avoid decongestants.   Pertinent Lab Values:  10/24/18: Serum creatinine 1.01, BUN 17, Potassium 3.9, Sodium 137  Vital Signs:  Weight: 221 lb (dry weight: 220-225 lb)  Blood pressure: 154/96 mmHg   Heart rate: 74 bpm   ReDS Vest - 36%>>34%  Assessment: 1. Chronicsystolic CHF (EF 08>>67-61%), due to NICM (?HTN/OSA). NYHA class II-IIIsymptoms.  - Volume status stable   - Stop losartan and try Entresto 24-26 mg BID since had severe nausea/diarrhea with 97-103 mg BID dose  - Increase bisoprolol to 5 mg daily (cautiously since fatigue was a major issue on BB in the past)  - Continue furosemide 20 mg daily and spironolactone 25 mg daily  - Repeat echo in 3-4 months. If EF still <= 35% consider cMRI and ICD - Basic disease state pathophysiology, medication indication, mechanism and side effects reviewed at length with patient and he verbalized understanding  2. HTN - Remains above goal of <130/90 - Restarting lower dose Entresto today and increasing bisoprolol  3. COPD with ongoing  tobacco use - Encouraged cessation. Follows with Pulmonary at [facility] - Would like to continue to reduce amount smoking on his own  4. OSA - inadequately treated. Refer to Dr. Claiborne Billings to help with CPAP complaince  5. Morbid obesity - Congratulated on 2 lb weight loss but encouraged continued portion control  6. Trifascicular block - Followed by Dr. Gwenlyn Found  Plan: 1) Medication  changes: Based on clinical presentation, vital signs and recent labs will stop losartan and start Entresto 24-26 mg BID and increase bisoprolol to 5 mg daily 2) Labs: BMET today 3) Follow-up: Pharmacy visit on 11/18 and Dr. Haroldine Laws in 3 months   [place]. Velva Harman, PharmD, BCPS, [place] Clinical Pharmacist Phone: [phone]

## 2018-11-01 ENCOUNTER — Other Ambulatory Visit: Payer: Self-pay | Admitting: Family Medicine

## 2018-11-01 DIAGNOSIS — M25562 Pain in left knee: Secondary | ICD-10-CM

## 2018-11-07 ENCOUNTER — Inpatient Hospital Stay (HOSPITAL_COMMUNITY)
Admission: RE | Admit: 2018-11-07 | Discharge: 2018-11-07 | Disposition: A | Discharge status: Home / Self Care | Payer: BLUE CROSS/BLUE SHIELD | Source: Ambulatory Visit

## 2018-11-12 ENCOUNTER — Other Ambulatory Visit: Payer: BLUE CROSS/BLUE SHIELD

## 2018-11-25 DIAGNOSIS — Z86010 Personal history of colonic polyps: Secondary | ICD-10-CM | POA: Insufficient documentation

## 2018-12-16 ENCOUNTER — Encounter (INDEPENDENT_AMBULATORY_CARE_PROVIDER_SITE_OTHER): Payer: Self-pay

## 2018-12-16 ENCOUNTER — Ambulatory Visit (INDEPENDENT_AMBULATORY_CARE_PROVIDER_SITE_OTHER): Payer: BLUE CROSS/BLUE SHIELD | Admitting: Cardiovascular Disease

## 2018-12-16 ENCOUNTER — Encounter: Payer: Self-pay | Admitting: Cardiovascular Disease

## 2018-12-16 VITALS — BP 126/74 | HR 89 | Ht 70.0 in | Wt 222.0 lb

## 2018-12-16 DIAGNOSIS — I519 Heart disease, unspecified: Secondary | ICD-10-CM

## 2018-12-16 DIAGNOSIS — F17210 Nicotine dependence, cigarettes, uncomplicated: Secondary | ICD-10-CM | POA: Diagnosis not present

## 2018-12-16 DIAGNOSIS — G4730 Sleep apnea, unspecified: Secondary | ICD-10-CM | POA: Diagnosis not present

## 2018-12-16 DIAGNOSIS — I5189 Other ill-defined heart diseases: Secondary | ICD-10-CM

## 2018-12-16 DIAGNOSIS — I428 Other cardiomyopathies: Secondary | ICD-10-CM | POA: Diagnosis not present

## 2018-12-16 DIAGNOSIS — I25119 Atherosclerotic heart disease of native coronary artery with unspecified angina pectoris: Secondary | ICD-10-CM

## 2018-12-16 DIAGNOSIS — I10 Essential (primary) hypertension: Secondary | ICD-10-CM | POA: Diagnosis not present

## 2018-12-16 NOTE — Patient Instructions (Signed)
Medication Instructions:  Your physician recommends that you continue on your current medications as directed. Please refer to the Current Medication list given to you today. If you need a refill on your cardiac medications before your next appointment, please call your pharmacy.   Lab work: NONE If you have labs (blood work) drawn today and your tests are completely normal, you will receive your results only by: Marland Kitchen MyChart Message (if you have MyChart) OR . A paper copy in the mail If you have any lab test that is abnormal or we need to change your treatment, we will call you to review the results.  Testing/Procedures: Your physician has requested that you have an echocardiogram. Echocardiography is a painless test that uses sound waves to create images of your heart. It provides your doctor with information about the size and shape of your heart and how well your heart's chambers and valves are working. This procedure takes approximately one hour. There are no restrictions for this procedure. SCHEDULE IN January 2020  Follow-Up: At [facility], you and your health needs are our priority.  As part of our continuing mission to provide you with exceptional heart care, we have created designated Provider Care Teams.  These Care Teams include your primary Cardiologist (physician) and Advanced Practice Providers (APPs -  Physician Assistants and Nurse Practitioners) who all work together to provide you with the care you need, when you need it.  You will need a follow up appointment in 2 [place] AND IN 3 MONTHS WITH DR. Gwenlyn Found.

## 2018-12-16 NOTE — Assessment & Plan Note (Signed)
History of ongoing tobacco abuse recalcitrant to risk factor modification. 

## 2018-12-16 NOTE — Assessment & Plan Note (Signed)
History of obstructive sleep apnea on CPAP which he uses intermittently. 

## 2018-12-16 NOTE — Assessment & Plan Note (Signed)
History of essential hypertension blood pressure measured today 126/74.  He is on amlodipine, bisoprolol and losartan.

## 2018-12-16 NOTE — Assessment & Plan Note (Signed)
Cardiac catheterization performed by Dr. Tamala Julian 04/19/2017 showed essentially normal coronary arteries.

## 2018-12-16 NOTE — Assessment & Plan Note (Signed)
History of nonischemic cardiomyopathy with an EF in the 30% range 2D echo performed 07/07/2018.  Referred him to Dr. Haroldine Laws who changed his carvedilol to Zebeta losartan to [facility].  Unfortunately, he did not tolerate the Entresto and he was changed back to losartan.  He is to have class II-III symptoms.  We will recheck a 2D echo next month and I will have him return to see Dr. Haroldine Laws which time if his EF has not improved we will need to discuss ICD implantation

## 2018-12-16 NOTE — Progress Notes (Signed)
12/16/2018 Robert Church   June 20, 1956  [id number]  Primary Physician Hamrick, Lorin Mercy, MD Primary Cardiologist: [name] MD Garret Reddish, [place], [place]  HPI:  Robert Church is a 62 y.o.  severely overweight divorced Caucasian male father of 1 grandchild who works in [employer].  He was referred because of chest pain by Dr. Lisbeth Ply for cardia vascular evaluation.  I last saw him in the office His mother is at [facility], 1 of my long-term patients as well.  He saw Dr. Johnsie Cancel in the past.  His risk factors include ongoing tobacco abuse, hypertension and family history.  Is never had a heart attack or stroke.  He did have a heart catheterization performed by Dr. Tamala Julian on 04/19/2017 that showed normal coronary arteries with an EF of 40% consistent with a nonischemic cardia myopathy.    A 2D echo performed 07/07/2018 showed an EF of 30 to 35%.  I referred him to Dr. Haroldine Laws in the advanced heart failure clinic who changed his carvedilol to Zebeta and his losartan to [facility].  Unfortunately, he did not tolerate the Entresto and he was changed back to losartan he does have obstructive sleep apnea on CPAP which he wears intermittently.  He continues to exhibit class II/III symptoms.  Current Meds  Medication Sig  . albuterol (PROVENTIL HFA;VENTOLIN HFA) 108 (90 BASE) MCG/ACT inhaler Inhale 2 puffs into the lungs every 6 (six) hours as needed for wheezing.  Marland Kitchen albuterol (PROVENTIL) (2.5 MG/3ML) 0.083% nebulizer solution Take 3 mLs (2.5 mg total) by nebulization every 6 (six) hours as needed for wheezing or shortness of breath.  . allopurinol (ZYLOPRIM) 100 MG tablet Take 200 mg by mouth daily.  Marland Kitchen ALPRAZolam (XANAX) 1 MG tablet Take 0.5-2 mg by mouth See admin instructions. 0.5 mg in the morning and afternoon and take 2mg  at bedtime  . amLODipine (NORVASC) 10 MG tablet Take 10 mg by mouth daily.   Marland Kitchen aspirin EC 81 MG tablet Take 1 tablet (81 mg total) by mouth daily.  .  Aspirin-Salicylamide-Caffeine (BC FAST PAIN RELIEF) 650-195-33.3 MG PACK Take 1 packet by mouth every 8 (eight) hours as needed (for knee pain.).  . Azelastine-Fluticasone 137-50 MCG/ACT SUSP 1 spray each nostril twice daily  . bisoprolol (ZEBETA) 5 MG tablet Take 1 tablet (5 mg total) by mouth daily.  . cholecalciferol (VITAMIN D) 1000 units tablet Take 1,000 Units by mouth daily.  . furosemide (LASIX) 20 MG tablet Take 20 mg by mouth every morning.   . mometasone-formoterol (DULERA) 200-5 MCG/ACT AERO Take 2 puffs first thing in am and then another 2 puffs about 12 hours later.  Marland Kitchen omeprazole (PRILOSEC) 20 MG capsule Take 20 mg by mouth 2 (two) times daily. ACID REFLUX  . Roflumilast 250 MCG TABS Take 250 mcg by mouth daily.  . sacubitril-valsartan (ENTRESTO) 24-26 MG Take 1 tablet by mouth 2 (two) times daily.  Marland Kitchen spironolactone (ALDACTONE) 25 MG tablet Take 25 mg by mouth daily.  . Testosterone 30 MG/ACT SOLN Apply 1 application topically daily. Apply under each arm daily  . Tiotropium Bromide Monohydrate (SPIRIVA RESPIMAT) 2.5 MCG/ACT AERS Inhale 2.5 mcg into the lungs 2 (two) times daily.     Allergies  Allergen Reactions  . Clindamycin Other (See Comments)    Tongue swelling  . E-Mycin [Erythromycin Base] Swelling  . Penicillins Swelling and Other (See Comments)    Childhood rxn--MD stated he "almost died" Has patient had a PCN reaction  causing immediate rash, facial/tongue/throat swelling, SOB or lightheadedness with hypotension:Yes Has patient had a PCN reaction causing severe rash involving mucus membranes or skin necrosis:unsure Has patient had a PCN reaction that required hospitalization:unsure Has patient had a PCN reaction occurring within the last 10 years:No If all of the above answers are "NO", then may proceed with Cephalosporin use.    . Rosuvastatin     Other reaction(s): Myalgias (intolerance)    Social History   Socioeconomic History  . Marital status: Single     Spouse name: Not on file  . Number of children: Not on file  . Years of education: Not on file  . Highest education level: Not on file  Occupational History  . Not on file  Social Needs  . Financial resource strain: Not on file  . Food insecurity:    Worry: Not on file    Inability: Not on file  . Transportation needs:    Medical: Not on file    Non-medical: Not on file  Tobacco Use  . Smoking status: Light Tobacco Smoker    Packs/day: 0.25    Years: 46.00    Pack years: 11.50    Types: Cigarettes    Last attempt to quit: 10/05/2016    Years since quitting: 2.1  . Smokeless tobacco: Never Used  . Tobacco comment: 5 cigs qd (07/01/16)  Substance and Sexual Activity  . Alcohol use: No    Alcohol/week: 0.0 standard drinks  . Drug use: No  . Sexual activity: Yes    Birth control/protection: None  Lifestyle  . Physical activity:    Days per week: Not on file    Minutes per session: Not on file  . Stress: Not on file  Relationships  . Social connections:    Talks on phone: Not on file    Gets together: Not on file    Attends religious service: Not on file    Active member of club or organization: Not on file    Attends meetings of clubs or organizations: Not on file    Relationship status: Not on file  . Intimate partner violence:    Fear of current or ex partner: Not on file    Emotionally abused: Not on file    Physically abused: Not on file    Forced sexual activity: Not on file  Other Topics Concern  . Not on file  Social History Narrative  . Not on file     Review of Systems: General: negative for chills, fever, night sweats or weight changes.  Cardiovascular: negative for chest pain, dyspnea on exertion, edema, orthopnea, palpitations, paroxysmal nocturnal dyspnea or shortness of breath Dermatological: negative for rash Respiratory: negative for cough or wheezing Urologic: negative for hematuria Abdominal: negative for nausea, vomiting, diarrhea, bright red  blood per rectum, melena, or hematemesis Neurologic: negative for visual changes, syncope, or dizziness All other systems reviewed and are otherwise negative except as noted above.    Blood pressure 126/74, pulse 89, height 5\' 10"  (1.778 m), weight 222 lb (100.7 kg).  General appearance: alert and no distress Neck: no adenopathy, no carotid bruit, no JVD, supple, symmetrical, trachea midline and thyroid not enlarged, symmetric, no tenderness/mass/nodules Lungs: clear to auscultation bilaterally Heart: regular rate and rhythm, S1, S2 normal, no murmur, click, rub or gallop Extremities: extremities normal, atraumatic, no cyanosis or edema Pulses: 2+ and symmetric Skin: Skin color, texture, turgor normal. No rashes or lesions Neurologic: Alert and oriented X 3, normal strength  and tone. Normal symmetric reflexes. Normal coordination and gait  EKG normal sinus rhythm with right bundle branch block and left posterior fascicular block.  I personally reviewed this EKG.  ASSESSMENT AND PLAN:   HTN (hypertension) History of essential hypertension blood pressure measured today 126/74.  He is on amlodipine, bisoprolol and losartan.  Sleep apnea History of obstructive sleep apnea on CPAP which he uses intermittently.  Cigarette smoker History of ongoing tobacco abuse recalcitrant to risk factor modification.  Nonischemic cardiomyopathy ([place]) History of nonischemic cardiomyopathy with an EF in the 30% range 2D echo performed 07/07/2018.  Referred him to Dr. Haroldine Laws who changed his carvedilol to Zebeta losartan to [facility].  Unfortunately, he did not tolerate the Entresto and he was changed back to losartan.  He is to have class II-III symptoms.  We will recheck a 2D echo next month and I will have him return to see Dr. Haroldine Laws which time if his EF has not improved we will need to discuss ICD implantation  CAD (coronary artery disease) Cardiac catheterization performed by Dr. Tamala Julian 04/19/2017  showed essentially normal coronary arteries.      [name] MD FACP,FACC,FAHA, [facility] 12/16/2018 4:30 PM

## 2018-12-22 ENCOUNTER — Other Ambulatory Visit: Payer: Self-pay

## 2018-12-22 ENCOUNTER — Ambulatory Visit (HOSPITAL_COMMUNITY): Payer: BLUE CROSS/BLUE SHIELD | Attending: Cardiovascular Disease

## 2018-12-22 DIAGNOSIS — I519 Heart disease, unspecified: Secondary | ICD-10-CM | POA: Diagnosis not present

## 2018-12-22 DIAGNOSIS — I5189 Other ill-defined heart diseases: Secondary | ICD-10-CM

## 2019-01-11 ENCOUNTER — Other Ambulatory Visit: Payer: Self-pay | Admitting: Family Medicine

## 2019-01-11 DIAGNOSIS — S0990XA Unspecified injury of head, initial encounter: Secondary | ICD-10-CM

## 2019-01-11 DIAGNOSIS — R519 Headache, unspecified: Secondary | ICD-10-CM

## 2019-01-11 DIAGNOSIS — R51 Headache: Secondary | ICD-10-CM

## 2019-01-11 DIAGNOSIS — R112 Nausea with vomiting, unspecified: Secondary | ICD-10-CM

## 2019-01-12 ENCOUNTER — Ambulatory Visit
Admission: RE | Admit: 2019-01-12 | Discharge: 2019-01-12 | Disposition: A | Discharge status: Home / Self Care | Payer: BLUE CROSS/BLUE SHIELD | Source: Ambulatory Visit | Attending: Family Medicine | Admitting: Family Medicine

## 2019-01-12 DIAGNOSIS — R51 Headache: Secondary | ICD-10-CM

## 2019-01-12 DIAGNOSIS — R112 Nausea with vomiting, unspecified: Secondary | ICD-10-CM

## 2019-01-12 DIAGNOSIS — S0990XA Unspecified injury of head, initial encounter: Secondary | ICD-10-CM

## 2019-01-12 DIAGNOSIS — R519 Headache, unspecified: Secondary | ICD-10-CM

## 2019-02-15 ENCOUNTER — Ambulatory Visit (HOSPITAL_COMMUNITY)
Admission: RE | Admit: 2019-02-15 | Discharge: 2019-02-15 | Disposition: A | Discharge status: Home / Self Care | Payer: BLUE CROSS/BLUE SHIELD | Source: Ambulatory Visit | Attending: Internal Medicine | Admitting: Internal Medicine

## 2019-02-15 VITALS — BP 152/97 | HR 99 | Wt 224.2 lb

## 2019-02-15 DIAGNOSIS — I5022 Chronic systolic (congestive) heart failure: Secondary | ICD-10-CM

## 2019-02-15 DIAGNOSIS — I453 Trifascicular block: Secondary | ICD-10-CM | POA: Diagnosis not present

## 2019-02-15 DIAGNOSIS — Z79899 Other long term (current) drug therapy: Secondary | ICD-10-CM | POA: Diagnosis not present

## 2019-02-15 DIAGNOSIS — I10 Essential (primary) hypertension: Secondary | ICD-10-CM

## 2019-02-15 DIAGNOSIS — K219 Gastro-esophageal reflux disease without esophagitis: Secondary | ICD-10-CM | POA: Insufficient documentation

## 2019-02-15 DIAGNOSIS — Z7982 Long term (current) use of aspirin: Secondary | ICD-10-CM | POA: Insufficient documentation

## 2019-02-15 DIAGNOSIS — Z6832 Body mass index (BMI) 32.0-32.9, adult: Secondary | ICD-10-CM | POA: Diagnosis not present

## 2019-02-15 DIAGNOSIS — I110 Hypertensive heart disease with heart failure: Secondary | ICD-10-CM | POA: Insufficient documentation

## 2019-02-15 DIAGNOSIS — G4733 Obstructive sleep apnea (adult) (pediatric): Secondary | ICD-10-CM | POA: Diagnosis not present

## 2019-02-15 DIAGNOSIS — G4730 Sleep apnea, unspecified: Secondary | ICD-10-CM

## 2019-02-15 DIAGNOSIS — F17210 Nicotine dependence, cigarettes, uncomplicated: Secondary | ICD-10-CM | POA: Insufficient documentation

## 2019-02-15 DIAGNOSIS — J449 Chronic obstructive pulmonary disease, unspecified: Secondary | ICD-10-CM | POA: Insufficient documentation

## 2019-02-15 DIAGNOSIS — I428 Other cardiomyopathies: Secondary | ICD-10-CM | POA: Insufficient documentation

## 2019-02-15 DIAGNOSIS — R0600 Dyspnea, unspecified: Secondary | ICD-10-CM

## 2019-02-15 DIAGNOSIS — I25119 Atherosclerotic heart disease of native coronary artery with unspecified angina pectoris: Secondary | ICD-10-CM

## 2019-02-15 LAB — BASIC METABOLIC PANEL
Anion gap: 9 (ref 5–15)
BUN: 15 mg/dL (ref 6–20)
CO2: 27 mmol/L (ref 22–32)
Calcium: 8.9 mg/dL (ref 8.9–10.3)
Chloride: 103 mmol/L (ref 98–111)
Creatinine, Ser: 0.96 mg/dL (ref 0.61–1.24)
GFR calc Af Amer: >60 mL/min (ref >60)
GFR calc non Af Amer: >60 mL/min (ref >60)
Glucose, Bld: 105 mg/dL — ABNORMAL HIGH (ref 70–99)
Potassium: 4 mmol/L (ref 3.5–5.1)
Sodium: 139 mmol/L (ref 135–145)

## 2019-02-15 MED ORDER — SPIRONOLACTONE 50 MG PO TABS: 50.0000 mg | ORAL_TABLET | Freq: Every day | ORAL | 6 refills | Status: DC

## 2019-02-15 NOTE — Progress Notes (Signed)
ReDS Vest - 02/15/19 0900      ReDS Vest   Estimated volume prior to reading  Med    Fitting Posture  Sitting    Height Marker  Tall    Ruler Value  34    Center Strip  Aligned    ReDS Value  36

## 2019-02-15 NOTE — Patient Instructions (Addendum)
Labs were done today. We will call you with any ABNORMAL results. No news is good news!  INCREASE Spirolactone to 50 mg (1 tab) daily at bedtime. You can take 2 tabs of your 25 mg tablets to equal 50 mg until you pick up your new prescription that has been sent to your pharmacy.   Please follow up with repeat lab work in 10-14 days, this appointment will be scheduled with you.  Follow up with our office as needed and continue your care with Dr. Gwenlyn Found

## 2019-02-15 NOTE — Progress Notes (Signed)
ADVANCED HF CLINIC NOTE  Referring Physician: Primary Care: Primary Cardiologist:   HPI:  Robert Church is a 63 y.o. male with h/o obesity, HTN, COPD with ongoing tobacco use and chronic systolic HF referred by Dr. Gwenlyn Found for further management of his HF.   He underwent cath in 4/18 due to CP. Cath showed normal coronaries with EF 40% and global HK. LVEDP 19. He was referred for a sleep study.   He saw Dr. Gwenlyn Found on 07/01/2018 because of recurrent chest pain.  Myoview stress test performed 07/21/2018 showed EF 38% inferior scar with septal ischemia.  A 2D echo performed 07/07/2018 revealed an EF of 30 to 35%.  He was referred for cardiac CT.   Cardiac CT on 08/26/18.  Calcium score 148 (74th percentile)  LAD 25% otherwise normal cors. Dilated pulmonary arteries suggestive of PH.   He presents today for regular follow up. Since last visit, switched off Entresto back to losartan and didn't tolerate BB with GI upset. Trys to wear his CPAP every night, but ends up taking it off during the night. Weight goes up and down. Not exercising. Smoking about 5 cigarettes a day still. Drinks 2 beers a day. 20 mg of lasix causes a good amount of urine output. He drinks 2 cups of tea and 1 cup of coffee. No soda, occasional cup of water. Still works for [employer]. Gets SOB quickly walking up hills. Denies SOB with ADLs. + bendopnea. Sleeps on 2 pillows chronically. BP tends to run lower at home and work. He eats a biscuit every morning.   PFTs 3/18 FEV1 2.54 (72%) FVC   3.48 (75%) DLCO 107%  Review of systems complete and found to be negative unless listed in HPI.    Past Medical History:  Diagnosis Date  . Allergy   . Asthma   . Bronchitis   . COPD (chronic obstructive pulmonary disease) ([place])   . GERD (gastroesophageal reflux disease)   . Hypertension   . Pneumonia   . Sinusitis   . SOB (shortness of breath)     Current Outpatient Medications  Medication Sig Dispense Refill  .  albuterol (PROVENTIL HFA;VENTOLIN HFA) 108 (90 BASE) MCG/ACT inhaler Inhale 2 puffs into the lungs every 6 (six) hours as needed for wheezing. 1 Inhaler 11  . albuterol (PROVENTIL) (2.5 MG/3ML) 0.083% nebulizer solution Take 3 mLs (2.5 mg total) by nebulization every 6 (six) hours as needed for wheezing or shortness of breath. 75 mL 12  . allopurinol (ZYLOPRIM) 100 MG tablet Take 200 mg by mouth daily.    Marland Kitchen ALPRAZolam (XANAX) 1 MG tablet Take 0.5-2 mg by mouth See admin instructions. 0.5 mg in the morning and afternoon and take 2mg  at bedtime    . amLODipine (NORVASC) 10 MG tablet Take 10 mg by mouth daily.     Marland Kitchen aspirin EC 81 MG tablet Take 1 tablet (81 mg total) by mouth daily. 90 tablet 3  . Aspirin-Salicylamide-Caffeine (BC FAST PAIN RELIEF) 650-195-33.3 MG PACK Take 1 packet by mouth every 8 (eight) hours as needed (for knee pain.).    . Azelastine-Fluticasone 137-50 MCG/ACT SUSP 1 spray each nostril twice daily 23 g 6  . cholecalciferol (VITAMIN D) 1000 units tablet Take 1,000 Units by mouth daily.    . furosemide (LASIX) 20 MG tablet Take 20 mg by mouth every morning.     Marland Kitchen losartan (COZAAR) 100 MG tablet Take 100 mg by mouth daily.    . mometasone-formoterol (  DULERA) 200-5 MCG/ACT AERO Take 2 puffs first thing in am and then another 2 puffs about 12 hours later. 1 Inhaler 11  . omeprazole (PRILOSEC) 20 MG capsule Take 20 mg by mouth 2 (two) times daily. ACID REFLUX    . Roflumilast 250 MCG TABS Take 250 mcg by mouth daily.    Marland Kitchen spironolactone (ALDACTONE) 25 MG tablet Take 25 mg by mouth daily.    . Testosterone 30 MG/ACT SOLN Apply 1 application topically daily. Apply under each arm daily    . Tiotropium Bromide Monohydrate (SPIRIVA RESPIMAT) 2.5 MCG/ACT AERS Inhale 2.5 mcg into the lungs 2 (two) times daily.     No current facility-administered medications for this encounter.     Allergies  Allergen Reactions  . Clindamycin Other (See Comments)    Tongue swelling  . E-Mycin  [Erythromycin Base] Swelling  . Penicillins Swelling and Other (See Comments)    Childhood rxn--MD stated he "almost died" Has patient had a PCN reaction causing immediate rash, facial/tongue/throat swelling, SOB or lightheadedness with hypotension:Yes Has patient had a PCN reaction causing severe rash involving mucus membranes or skin necrosis:unsure Has patient had a PCN reaction that required hospitalization:unsure Has patient had a PCN reaction occurring within the last 10 years:No If all of the above answers are "NO", then may proceed with Cephalosporin use.    . Rosuvastatin     Other reaction(s): Myalgias (intolerance)      Social History   Socioeconomic History  . Marital status: Single    Spouse name: Not on file  . Number of children: Not on file  . Years of education: Not on file  . Highest education level: Not on file  Occupational History  . Not on file  Social Needs  . Financial resource strain: Not on file  . Food insecurity:    Worry: Not on file    Inability: Not on file  . Transportation needs:    Medical: Not on file    Non-medical: Not on file  Tobacco Use  . Smoking status: Light Tobacco Smoker    Packs/day: 0.25    Years: 46.00    Pack years: 11.50    Types: Cigarettes    Last attempt to quit: 10/05/2016    Years since quitting: 2.3  . Smokeless tobacco: Never Used  . Tobacco comment: 5 cigs qd (07/01/16)  Substance and Sexual Activity  . Alcohol use: No    Alcohol/week: 0.0 standard drinks  . Drug use: No  . Sexual activity: Yes    Birth control/protection: None  Lifestyle  . Physical activity:    Days per week: Not on file    Minutes per session: Not on file  . Stress: Not on file  Relationships  . Social connections:    Talks on phone: Not on file    Gets together: Not on file    Attends religious service: Not on file    Active member of club or organization: Not on file    Attends meetings of clubs or organizations: Not on file     Relationship status: Not on file  . Intimate partner violence:    Fear of current or ex partner: Not on file    Emotionally abused: Not on file    Physically abused: Not on file    Forced sexual activity: Not on file  Other Topics Concern  . Not on file  Social History Narrative  . Not on file      Family  History  Problem Relation Age of Onset  . Lung cancer Father   . Brain cancer Father     Vitals:   02/15/19 0913  BP: (!) 152/97  Pulse: 99  SpO2: 92%  Weight: 101.7 kg (224 lb 3.2 oz)    PHYSICAL EXAM: General: NAD.  HEENT: Normal Neck: Supple. JVP ~7-8 cm. Carotids 2+ bilat; no bruits. No thyromegaly or nodule noted. Cor: PMI nondisplaced. RRR, No M/G/R noted Lungs: Mildly diminished throughout with mild expiratory wheeze greatest in bases.  Abdomen: Obese, non-tender, non-distended, no HSM. No bruits or masses. +BS  Extremities: No cyanosis, clubbing, or rash. 1+ BLE edema.  Neuro: Alert & orientedx3, cranial nerves grossly intact. moves all 4 extremities w/o difficulty. Affect pleasant   ASSESSMENT & PLAN:  1. Chronic systolic HF due to NICM  - cath 4/18 normal cors. EF 48%  - Myoview 07/21/2018 showed EF 38% inferior scar with septal ischemia.   - Echo 07/07/2018 revealed EF 30-35%. RV normal.  - Echo 12/22/18 LVEF slightly improved to 40-45% with Grade 1 DD. Out of ICD range.  - Cardiac CT 08/26/18.  Calcium score 148 (74th percentile)  LAD 25% otherwise normal cors. Dilated pulmonary arteries suggestive of PH.  - Suspect NICM due to HTN and inadequately treated OSA - NYHA II-III symptoms due to a combination of HF, arthirtis and COPD though PFTs not that bad - Volume status appears OK centrally with peripheral edema. ReDs Clip 36% in the setting of COPD.  - Continue lasix 20 mg daily - Continue losartan 100 mg daily. Intolerant to [employer].  - Increase Spiro to 50 mg daily.  - He has failed BB several times now with severe fatigue and GI upset, including  Bisoprolol. Will not rechallenge.   2. HTN - Modest control.  - Increase losartan. Intolerant to [place], refuses re-challenge.   3. COPD with ongoing tobacco use - Encouraged smoking cessation.   4. OSA - Inadequately treated. Stressed importance of CPAP compliance and weight loss.   5. Morbid obesity  - Body mass index is 32.17 kg/m.  - Stressed need for weight loss and recommended [employer].   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  Volumes status stable to very mildly elevated.  Will increase Arlyce Harman as above for mild diuresis and improved BP control. Recommended he moved it to bedtime. His EF is stable. Suspect his dyspnea is primarily due to his obesity, deconditioning, and COPD. Recommended smoking cessation, diet, and exercise (weight loss).   Discussed all above with Dr. Haroldine Laws. With stable EF, we will leave the majority of his care to Dr. Gwenlyn Found. We will be glad to see again as needed.   Satira Mccallum , PA-C  9:22 AM  Greater than 50% of the 25 minute visit was spent in counseling/coordination of care regarding disease state education, salt/fluid restriction, sliding scale diuretics, and medication compliance.

## 2019-02-22 ENCOUNTER — Telehealth (HOSPITAL_COMMUNITY): Payer: Self-pay

## 2019-02-22 NOTE — Telephone Encounter (Signed)
Spoke with pt regarding recent Robert Church med refill on last appt, confirmed it was sent/received to appropriate pharm. Advised pt to call pharm to check status of refill and to call office back if refill needs to be resent.

## 2019-02-23 ENCOUNTER — Ambulatory Visit: Payer: BLUE CROSS/BLUE SHIELD | Admitting: Cardiovascular Disease

## 2019-02-23 ENCOUNTER — Ambulatory Visit (INDEPENDENT_AMBULATORY_CARE_PROVIDER_SITE_OTHER): Payer: BLUE CROSS/BLUE SHIELD | Admitting: Cardiovascular Disease

## 2019-02-23 ENCOUNTER — Encounter: Payer: Self-pay | Admitting: Cardiovascular Disease

## 2019-02-23 VITALS — BP 124/86 | HR 86 | Ht 70.0 in | Wt 222.8 lb

## 2019-02-23 DIAGNOSIS — Z789 Other specified health status: Secondary | ICD-10-CM

## 2019-02-23 NOTE — Patient Instructions (Addendum)
Medication Instructions:  Your physician recommends that you continue on your current medications as directed. Please refer to the Current Medication list given to you today.  If you need a refill on your cardiac medications before your next appointment, please call your pharmacy.   Lab work: Your physician recommends that you return for lab work within 1 WEEK: [place]  If you have labs (blood work) drawn today and your tests are completely normal, you will receive your results only by: Marland Kitchen MyChart Message (if you have MyChart) OR . A paper copy in the mail If you have any lab test that is abnormal or we need to change your treatment, we will call you to review the results.  Testing/Procedures: NONE  Follow-Up: At [facility], you and your health needs are our priority.  As part of our continuing mission to provide you with exceptional heart care, we have created designated Provider Care Teams.  These Care Teams include your primary Cardiologist (physician) and Advanced Practice Providers (APPs -  Physician Assistants and Nurse Practitioners) who all work together to provide you with the care you need, when you need it. . You will need a follow up appointment in 6 months with an APP and in 12  months with Dr. Gwenlyn Found.  Please call our office 2 months in advance to schedule this appointment.  You may see Dr. Gwenlyn Found or one of the following Advanced Practice Providers on your designated Care Team:   . Kerin Ransom, [place] . Almyra Deforest, PA-C . Fabian Sharp, PA-C . Jory Sims, DNP . Rosaria Ferries, PA-C . Roby Lofts, PA-C . Sande Rives, PA-C

## 2019-02-23 NOTE — Assessment & Plan Note (Signed)
History of nonischemic cardiomyopathy with EF by 2D echo performed 12/22/2018 of 40 to 45% with grade 1 diastolic dysfunction.  He was switched to [facility] by Dr. Haroldine Laws which he did not tolerate so he is back taking losartan.  He was also placed on Zebeta which he is currently is not taking as well because of side effects.

## 2019-02-23 NOTE — Assessment & Plan Note (Signed)
History of essential hypertension her blood pressure measured today 124/86.  He is on amlodipine and losartan as well as spironolactone.

## 2019-02-23 NOTE — Assessment & Plan Note (Signed)
Continue tobacco abuse of 4 to 5 cigarettes a day recalcitrant to risk factor modification.

## 2019-02-23 NOTE — Progress Notes (Signed)
02/23/2019 Robert Church   1956/09/19  [id number]  Primary Physician Hamrick, Lorin Mercy, MD Primary Cardiologist: [name] MD Garret Reddish, [place], [place]  HPI:  Robert Church is a 63 y.o.  severely overweight divorced Caucasian male father of 1 grandchild who works in [place] [employer]. He was referred because of chest pain by Dr. Lisbeth Ply for cardiovascular evaluation.  I last saw him in the office12/27/2019.  His mother is at [facility], one of my long-term patients as well. He saw Dr. Johnsie Cancel in the past. His risk factors include ongoing tobacco abuse, hypertension and family history.  He never had a heart attack or stroke. He did have a heart catheterization performed by Dr. Tamala Julian on 04/19/2017 that showed normal coronary arteries with an EF of 40% consistent with a nonischemic cardia myopathy.   A 2D echo performed 07/07/2018 showed an EF of 30 to 35%.  I referred him to Dr. Haroldine Laws in the advanced heart failure clinic who changed his carvedilol to Zebeta and his losartan to [facility].  Unfortunately, he did not tolerate the Entresto and he was changed back to losartan.  He does have obstructive sleep apnea on CPAP which he wears intermittently.  He continues to exhibit class II/III symptoms.  Since I saw him 3 months ago he did have an echo performed 12/22/2018 revealing an EF of 40 to 45%.  He continues to complain of dyspnea.  Continues to smoke 4 to 5 cigarettes a day and admits to some dietary indiscretion with regards to salt.   Current Meds  Medication Sig  . albuterol (PROVENTIL HFA;VENTOLIN HFA) 108 (90 BASE) MCG/ACT inhaler Inhale 2 puffs into the lungs every 6 (six) hours as needed for wheezing.  Marland Kitchen albuterol (PROVENTIL) (2.5 MG/3ML) 0.083% nebulizer solution Take 3 mLs (2.5 mg total) by nebulization every 6 (six) hours as needed for wheezing or shortness of breath.  . allopurinol (ZYLOPRIM) 100 MG tablet Take 200 mg by mouth daily.  Marland Kitchen ALPRAZolam (XANAX) 1 MG tablet  Take 0.5-2 mg by mouth See admin instructions. 0.5 mg in the morning and afternoon and take 2mg  at bedtime  . amLODipine (NORVASC) 10 MG tablet Take 10 mg by mouth daily.   Marland Kitchen aspirin EC 81 MG tablet Take 1 tablet (81 mg total) by mouth daily.  . Aspirin-Salicylamide-Caffeine (BC FAST PAIN RELIEF) 650-195-33.3 MG PACK Take 1 packet by mouth every 8 (eight) hours as needed (for knee pain.).  . Azelastine-Fluticasone 137-50 MCG/ACT SUSP 1 spray each nostril twice daily  . cholecalciferol (VITAMIN D) 1000 units tablet Take 1,000 Units by mouth daily.  . furosemide (LASIX) 20 MG tablet Take 20 mg by mouth every morning.   Marland Kitchen losartan (COZAAR) 100 MG tablet Take 100 mg by mouth daily.  . mometasone-formoterol (DULERA) 200-5 MCG/ACT AERO Take 2 puffs first thing in am and then another 2 puffs about 12 hours later.  Marland Kitchen omeprazole (PRILOSEC) 20 MG capsule Take 20 mg by mouth 2 (two) times daily. ACID REFLUX  . Roflumilast 250 MCG TABS Take 250 mcg by mouth daily.  Marland Kitchen spironolactone (ALDACTONE) 50 MG tablet Take 1 tablet (50 mg total) by mouth at bedtime.  . Testosterone 30 MG/ACT SOLN Apply 1 application topically daily. Apply under each arm daily  . Tiotropium Bromide Monohydrate (SPIRIVA RESPIMAT) 2.5 MCG/ACT AERS Inhale 2.5 mcg into the lungs 2 (two) times daily.     Allergies  Allergen Reactions  . Clindamycin Other (See Comments)  Tongue swelling  . E-Mycin [Erythromycin Base] Swelling  . Penicillins Swelling and Other (See Comments)    Childhood rxn--MD stated he "almost died" Has patient had a PCN reaction causing immediate rash, facial/tongue/throat swelling, SOB or lightheadedness with hypotension:Yes Has patient had a PCN reaction causing severe rash involving mucus membranes or skin necrosis:unsure Has patient had a PCN reaction that required hospitalization:unsure Has patient had a PCN reaction occurring within the last 10 years:No If all of the above answers are "NO", then may proceed  with Cephalosporin use.    . Rosuvastatin     Other reaction(s): Myalgias (intolerance)    Social History   Socioeconomic History  . Marital status: Single    Spouse name: Not on file  . Number of children: Not on file  . Years of education: Not on file  . Highest education level: Not on file  Occupational History  . Not on file  Social Needs  . Financial resource strain: Not on file  . Food insecurity:    Worry: Not on file    Inability: Not on file  . Transportation needs:    Medical: Not on file    Non-medical: Not on file  Tobacco Use  . Smoking status: Light Tobacco Smoker    Packs/day: 0.25    Years: 46.00    Pack years: 11.50    Types: Cigarettes    Last attempt to quit: 10/05/2016    Years since quitting: 2.3  . Smokeless tobacco: Never Used  . Tobacco comment: 5 cigs qd (07/01/16)  Substance and Sexual Activity  . Alcohol use: No    Alcohol/week: 0.0 standard drinks  . Drug use: No  . Sexual activity: Yes    Birth control/protection: None  Lifestyle  . Physical activity:    Days per week: Not on file    Minutes per session: Not on file  . Stress: Not on file  Relationships  . Social connections:    Talks on phone: Not on file    Gets together: Not on file    Attends religious service: Not on file    Active member of club or organization: Not on file    Attends meetings of clubs or organizations: Not on file    Relationship status: Not on file  . Intimate partner violence:    Fear of current or ex partner: Not on file    Emotionally abused: Not on file    Physically abused: Not on file    Forced sexual activity: Not on file  Other Topics Concern  . Not on file  Social History Narrative  . Not on file     Review of Systems: General: negative for chills, fever, night sweats or weight changes.  Cardiovascular: negative for chest pain, dyspnea on exertion, edema, orthopnea, palpitations, paroxysmal nocturnal dyspnea or shortness of  breath Dermatological: negative for rash Respiratory: negative for cough or wheezing Urologic: negative for hematuria Abdominal: negative for nausea, vomiting, diarrhea, bright red blood per rectum, melena, or hematemesis Neurologic: negative for visual changes, syncope, or dizziness All other systems reviewed and are otherwise negative except as noted above.    Blood pressure 124/86, pulse 86, height 5\' 10"  (1.778 m), weight 222 lb 12.8 oz (101.1 kg).  General appearance: alert and no distress Neck: no adenopathy, no carotid bruit, no JVD, supple, symmetrical, trachea midline and thyroid not enlarged, symmetric, no tenderness/mass/nodules Lungs: clear to auscultation bilaterally Heart: regular rate and rhythm, S1, S2 normal, no murmur, click,  rub or gallop Extremities: Trace lower extremity edema Pulses: 2+ and symmetric Skin: Skin color, texture, turgor normal. No rashes or lesions Neurologic: Alert and oriented X 3, normal strength and tone. Normal symmetric reflexes. Normal coordination and gait  EKG not performed today  ASSESSMENT AND PLAN:   HTN (hypertension) History of essential hypertension her blood pressure measured today 124/86.  He is on amlodipine and losartan as well as spironolactone.  Sleep apnea History of obstructive sleep apnea intolerant to CPAP  Cigarette smoker Continue tobacco abuse of 4 to 5 cigarettes a day recalcitrant to risk factor modification.  Dyspnea Chronic dyspnea probably multifactorial related to COPD with ongoing tobacco abuse, nonischemic cardiomyopathy with dietary indiscretion with regards to salt.  Nonischemic cardiomyopathy ([place]) History of nonischemic cardiomyopathy with EF by 2D echo performed 12/22/2018 of 40 to 45% with grade 1 diastolic dysfunction.  He was switched to [facility] by Dr. Haroldine Laws which he did not tolerate so he is back taking losartan.  He was also placed on Zebeta which he is currently is not taking as well because of  side effects.      [name] MD FACP,FACC,FAHA, [facility] 02/23/2019 3:39 PM

## 2019-02-23 NOTE — Assessment & Plan Note (Signed)
Chronic dyspnea probably multifactorial related to COPD with ongoing tobacco abuse, nonischemic cardiomyopathy with dietary indiscretion with regards to salt.

## 2019-02-23 NOTE — Assessment & Plan Note (Signed)
History of obstructive sleep apnea intolerant to CPAP 

## 2019-03-21 ENCOUNTER — Ambulatory Visit: Payer: BLUE CROSS/BLUE SHIELD | Admitting: Cardiovascular Disease

## 2019-05-02 ENCOUNTER — Other Ambulatory Visit: Payer: Self-pay | Admitting: Family Medicine

## 2019-05-02 DIAGNOSIS — M7989 Other specified soft tissue disorders: Secondary | ICD-10-CM

## 2019-05-02 DIAGNOSIS — M79661 Pain in right lower leg: Secondary | ICD-10-CM

## 2019-05-04 ENCOUNTER — Ambulatory Visit
Admission: RE | Admit: 2019-05-04 | Discharge: 2019-05-04 | Disposition: A | Discharge status: Home / Self Care | Payer: BLUE CROSS/BLUE SHIELD | Source: Ambulatory Visit | Attending: Family Medicine | Admitting: Family Medicine

## 2019-05-04 ENCOUNTER — Other Ambulatory Visit: Payer: Self-pay

## 2019-05-04 DIAGNOSIS — M7989 Other specified soft tissue disorders: Secondary | ICD-10-CM

## 2019-05-04 DIAGNOSIS — M79661 Pain in right lower leg: Secondary | ICD-10-CM

## 2019-06-07 DIAGNOSIS — K602 Anal fissure, unspecified: Secondary | ICD-10-CM | POA: Insufficient documentation

## 2019-06-07 DIAGNOSIS — K601 Chronic anal fissure: Secondary | ICD-10-CM | POA: Insufficient documentation

## 2019-07-05 ENCOUNTER — Telehealth: Payer: Self-pay | Admitting: *Deleted

## 2019-07-05 NOTE — Telephone Encounter (Signed)
A message was left, re: follow up visit. 

## 2019-08-15 ENCOUNTER — Encounter: Payer: Self-pay | Admitting: Emergency Medicine

## 2019-08-15 ENCOUNTER — Telehealth: Payer: Self-pay | Admitting: Emergency Medicine

## 2019-08-15 ENCOUNTER — Other Ambulatory Visit: Payer: Self-pay

## 2019-08-15 ENCOUNTER — Ambulatory Visit: Payer: BLUE CROSS/BLUE SHIELD | Admitting: Emergency Medicine

## 2019-08-15 ENCOUNTER — Ambulatory Visit (INDEPENDENT_AMBULATORY_CARE_PROVIDER_SITE_OTHER): Payer: BC Managed Care – PPO | Admitting: Emergency Medicine

## 2019-08-15 VITALS — BP 130/82 | HR 92 | Ht 70.0 in | Wt 216.0 lb

## 2019-08-15 DIAGNOSIS — J449 Chronic obstructive pulmonary disease, unspecified: Secondary | ICD-10-CM | POA: Diagnosis not present

## 2019-08-15 DIAGNOSIS — R911 Solitary pulmonary nodule: Secondary | ICD-10-CM | POA: Insufficient documentation

## 2019-08-15 DIAGNOSIS — G4733 Obstructive sleep apnea (adult) (pediatric): Secondary | ICD-10-CM | POA: Diagnosis not present

## 2019-08-15 DIAGNOSIS — F17210 Nicotine dependence, cigarettes, uncomplicated: Secondary | ICD-10-CM

## 2019-08-15 NOTE — Patient Instructions (Addendum)
Thank you for your visit to the [facility] today. It was a pleasure meeting you. Please schedule an appointment with [name] in 2-3 months. You will need to have decreased your tobacco use to 4 per day by then in order to consider stopping the prednisone.  For you sleep apnea please consider resuming the CPAP. This is essential to prevent worsening of your shortness of breath and to prevent your heart failure and COPD from worsening.  Guaifenesin should be taken at 600mg  (12hour tablets) of the extended relief tablets 2 times daily or 400mg  of the short acting tablets three times daily to help decrease the thickness of you mucus.  Please continue taking the dulera, spiriva, prednisone 5mg  daily and albuterol as needed for you COPD.   Please notify our clinic if you have a flare or increased shortness of breath.

## 2019-08-15 NOTE — Assessment & Plan Note (Addendum)
The patients COPD appears stable currently on near maximal therapy with a LABA/LAMA/ICS and SABA which he uses 3-4 times daily. He continues to have severe symptoms. GOLD level D. He described appropriate technique. He continues to smoke tobacco. His last set of PFT's was performed ~1.5 years prior and demonstrate persistent obstruction.  Plan: Please continue taking the dulera, spiriva, prednisone 5mg  daily and albuterol as needed for you COPD.  Please start taking guaifenesin either 600mg  (12hour tablets) of the extended relief tablets 2 times daily or 400mg  of the short acting tablets TID. Return in 3 months and discuss cessation of prednisone

## 2019-08-15 NOTE — Assessment & Plan Note (Signed)
Continue to smoke 6-8 cigarettes per day. Does not endorse the desire to stop. He understands the associated risks and the benefits of cessation but does not feel he is ready.   Plan: Advised that he cut back to 4 to consider prednisone discontinuation Discuss again at next appointment.

## 2019-08-15 NOTE — Telephone Encounter (Signed)
Called and spoke to pt. Pt is questioning if one the rationales for the handicap placard needs to be circled. I advised him it does. Per pt and per pt's chart I advised pt to mark the 'unable to walk 200 feet without having to stop and rest' rationale. Pt did so. Nothing further needed at this time.

## 2019-08-15 NOTE — Assessment & Plan Note (Signed)
Previously on CPAP after sleep study. Notes daytime somnolence, HTN, headache, dyspnea. Has BMI 31 and increased neck girdle. With his history of CHF and COPD he really should be on a CPAP nightly but he currently feels that he can not tolerate this. He complains of dry mouth.  Plan: Recommended sleep study or CPAP compliance Follow at next visit for readiness

## 2019-08-15 NOTE — Progress Notes (Signed)
Patient her to reestablish care  Subjective:  Patient ID: Robert Church, male    DOB: 1956/08/10  Age: 63 y.o. MRN: [id number]  CC:  Chief Complaint  Patient presents with  . Follow-up    HPI Robert Church presents for evaluation of his chronic COPD. He has a prominent PMH notable for HTN, CHF w/ LVER of 30%, CAD, HLD, tobacco use disorder, and stage COPD GOLD 1D. He had been following with Dr. Girard Cooter of [facility] for his COPD after moving to [facility]. He is now moving back to the area and wishes to reestablish care. He noted persistent dyspnea with walking greater than 100 feet. He is unable to take stairs, hills or walk in the heat. He has persistent cough and heavy sputum production in the mornings that lasts for hours. He notes several exacerbations over the past 6 months but did not need to be hospitalized for these.   He continue to smoke 6-8 per day.   He continues to take prednisone 5mg  daily which he feels helps. Last exacerbation 04/2019.  He stopped taking daliresp as he did not feel this was helping.   He had a CT in 02/2019 for cancer screening which showed a possible 9x61mm nodule in the RLL that could favor mucus plugging. Repeat CT in March 2021 Recommended.  Past Medical History:  Diagnosis Date  . Allergy   . Asthma   . Bronchitis   . COPD (chronic obstructive pulmonary disease) ([place])   . GERD (gastroesophageal reflux disease)   . Hypertension   . Pneumonia   . Sinusitis   . SOB (shortness of breath)     Past Surgical History:  Procedure Laterality Date  . LEFT HEART CATH AND CORONARY ANGIOGRAPHY N/A 04/19/2017   Procedure: Left Heart Cath and Coronary Angiography;  Surgeon: Belva Crome, MD;  Location: [facility];  Service: Cardiovascular;  Laterality: N/A;  . TONSILLECTOMY      Family History  Problem Relation Age of Onset  . Lung cancer Father   . Brain cancer Father     Social History   Socioeconomic History  . Marital  status: Single    Spouse name: Not on file  . Number of children: Not on file  . Years of education: Not on file  . Highest education level: Not on file  Occupational History  . Not on file  Social Needs  . Financial resource strain: Not on file  . Food insecurity    Worry: Not on file    Inability: Not on file  . Transportation needs    Medical: Not on file    Non-medical: Not on file  Tobacco Use  . Smoking status: Current Every Day Smoker    Packs/day: 0.50    Years: 46.00    Pack years: 23.00    Types: Cigarettes  . Smokeless tobacco: Never Used  Substance and Sexual Activity  . Alcohol use: No    Alcohol/week: 0.0 standard drinks  . Drug use: No  . Sexual activity: Yes    Birth control/protection: None  Lifestyle  . Physical activity    Days per week: Not on file    Minutes per session: Not on file  . Stress: Not on file  Relationships  . Social Herbalist on phone: Not on file    Gets together: Not on file    Attends religious service: Not on file    Active member of  club or organization: Not on file    Attends meetings of clubs or organizations: Not on file    Relationship status: Not on file  . Intimate partner violence    Fear of current or ex partner: Not on file    Emotionally abused: Not on file    Physically abused: Not on file    Forced sexual activity: Not on file  Other Topics Concern  . Not on file  Social History Narrative  . Not on file     ROS Review of Systems  Constitutional: Positive for activity change (Increasing heat decreases activity). Negative for fatigue, fever and unexpected weight change.  HENT: Negative for congestion, postnasal drip, rhinorrhea and sinus pain.   Eyes: Negative for redness.  Respiratory: Positive for apnea, cough, shortness of breath and wheezing. Negative for choking, chest tightness and stridor.   Cardiovascular: Positive for leg swelling. Negative for chest pain and palpitations.   Gastrointestinal: Positive for abdominal distention. Negative for abdominal pain, diarrhea and nausea.  Endocrine: Negative for polyuria.  Genitourinary: Negative for dysuria and hematuria.  Musculoskeletal: Negative for back pain, gait problem and myalgias.  Skin: Negative for rash and wound.  Neurological: Negative for dizziness, seizures, weakness and headaches.  Psychiatric/Behavioral: Negative for agitation and self-injury.    Objective:   Today's Vitals: BP 130/82   Pulse 92   Ht 5\' 10"  (1.778 m)   Wt 216 lb (98 kg)   SpO2 92%   BMI 30.99 kg/m   Physical Exam Constitutional:      General: He is not in acute distress.    Appearance: He is well-developed. He is not diaphoretic.  HENT:     Head: Normocephalic and atraumatic.  Eyes:     Conjunctiva/sclera: Conjunctivae normal.  Neck:     Musculoskeletal: Normal range of motion.  Cardiovascular:     Rate and Rhythm: Normal rate and regular rhythm.     Heart sounds: No murmur.  Pulmonary:     Effort: Pulmonary effort is normal. No respiratory distress.     Breath sounds: No stridor. Wheezing present.  Abdominal:     General: Bowel sounds are normal. There is no distension.     Palpations: Abdomen is soft.  Musculoskeletal:     Right lower leg: No edema.     Left lower leg: No edema.  Skin:    General: Skin is warm.     Capillary Refill: Capillary refill takes less than 2 seconds.  Neurological:     General: No focal deficit present.     Mental Status: He is alert and oriented to person, place, and time.  Psychiatric:        Mood and Affect: Mood normal.        Behavior: Behavior normal.        Judgment: Judgment normal.     Assessment & Plan:   Problem List Items Addressed This Visit      Respiratory   Sleep apnea    Previously on CPAP after sleep study. Notes daytime somnolence, HTN, headache, dyspnea. Has BMI 31 and increased neck girdle. With his history of CHF and COPD he really should be on a CPAP  nightly but he currently feels that he can not tolerate this. He complains of dry mouth.  Plan: Recommended sleep study or CPAP compliance Follow at next visit for readiness       COPD, group D, by GOLD 2017 classification ([place]) - Primary    The patients COPD  appears stable currently on near maximal therapy with a LABA/LAMA/ICS and SABA which he uses 3-4 times daily. He continues to have severe symptoms. GOLD level D. He described appropriate technique. He continues to smoke tobacco. His last set of PFT's was performed ~1.5 years prior and demonstrate persistent obstruction.  Plan: Please continue taking the dulera, spiriva, prednisone 5mg  daily and albuterol as needed for you COPD.  Please start taking guaifenesin either 600mg  (12hour tablets) of the extended relief tablets 2 times daily or 400mg  of the short acting tablets TID. Return in 3 months and discuss cessation of prednisone       Relevant Medications   predniSONE (DELTASONE) 10 MG tablet     Other   Cigarette smoker    Continue to smoke 6-8 cigarettes per day. Does not endorse the desire to stop. He understands the associated risks and the benefits of cessation but does not feel he is ready.   Plan: Advised that he cut back to 4 to consider prednisone discontinuation Discuss again at next appointment.       Solitary pulmonary nodule    Noted on CT in March 2020 at [facility]. 9x61mm RLL nodule favored to be mucus plugging. Recommended annual routine lung cancer screening in March 2021.         Outpatient Encounter Medications as of 08/15/2019  Medication Sig  . albuterol (PROVENTIL HFA;VENTOLIN HFA) 108 (90 BASE) MCG/ACT inhaler Inhale 2 puffs into the lungs every 6 (six) hours as needed for wheezing.  Marland Kitchen albuterol (PROVENTIL) (2.5 MG/3ML) 0.083% nebulizer solution Take 3 mLs (2.5 mg total) by nebulization every 6 (six) hours as needed for wheezing or shortness of breath.  . allopurinol (ZYLOPRIM) 100 MG tablet  Take 200 mg by mouth daily.  Marland Kitchen ALPRAZolam (XANAX) 1 MG tablet Take 0.5-2 mg by mouth See admin instructions. 0.5 mg in the morning and afternoon and take 2mg  at bedtime  . amLODipine (NORVASC) 10 MG tablet Take 10 mg by mouth daily.   Marland Kitchen aspirin EC 81 MG tablet Take 1 tablet (81 mg total) by mouth daily.  . Aspirin-Salicylamide-Caffeine (BC FAST PAIN RELIEF) 650-195-33.3 MG PACK Take 1 packet by mouth every 8 (eight) hours as needed (for knee pain.).  . Azelastine-Fluticasone 137-50 MCG/ACT SUSP 1 spray each nostril twice daily  . cholecalciferol (VITAMIN D) 1000 units tablet Take 1,000 Units by mouth daily.  . furosemide (LASIX) 20 MG tablet Take 20 mg by mouth every morning.   Marland Kitchen losartan (COZAAR) 100 MG tablet Take 100 mg by mouth daily.  . mometasone-formoterol (DULERA) 200-5 MCG/ACT AERO Take 2 puffs first thing in am and then another 2 puffs about 12 hours later.  Marland Kitchen omeprazole (PRILOSEC) 20 MG capsule Take 20 mg by mouth 2 (two) times daily. ACID REFLUX  . predniSONE (DELTASONE) 10 MG tablet Take 5 mg by mouth daily with breakfast.  . spironolactone (ALDACTONE) 50 MG tablet Take 1 tablet (50 mg total) by mouth at bedtime.  . Testosterone 30 MG/ACT SOLN Apply 1 application topically daily. Apply under each arm daily  . Tiotropium Bromide Monohydrate (SPIRIVA RESPIMAT) 2.5 MCG/ACT AERS Inhale 2.5 mcg into the lungs 2 (two) times daily.  . [DISCONTINUED] Roflumilast 250 MCG TABS Take 250 mcg by mouth daily.   No facility-administered encounter medications on file as of 08/15/2019.     Follow-up: No follow-ups on file.   Kathi Ludwig, MD

## 2019-08-15 NOTE — Assessment & Plan Note (Signed)
Noted on CT in March 2020 at [facility] 9x26mm RLL nodule favored to be mucus plugging. Recommended annual routine lung cancer screening in March 2021.

## 2019-09-05 ENCOUNTER — Ambulatory Visit: Payer: BC Managed Care – PPO | Admitting: Cardiovascular Disease

## 2019-09-13 ENCOUNTER — Telehealth: Payer: Self-pay | Admitting: Emergency Medicine

## 2019-09-13 MED ORDER — SPIRIVA RESPIMAT 2.5 MCG/ACT IN AERS: 2.5000 ug | INHALATION_SPRAY | Freq: Two times a day (BID) | RESPIRATORY_TRACT | 3 refills | Status: DC

## 2019-09-13 NOTE — Telephone Encounter (Signed)
Pt last seen 08/15/2019 by RB. Spiriva was last filled 04/15/2017. In last office notes from 08/15/2019, RB instructed pt to continue Spiriva. I have sent refill of Spiriva to pt's preferred pharmacy.   ATC pt to let him know of this; however, line went to voicemail. LMTCB x1.

## 2019-09-13 NOTE — Telephone Encounter (Signed)
Called and spoke w/ pt to let him know his Spiriva has been sent to his preferred pharmacy. Pt verbalized understanding with no additional questions. Nothing further needed at this time.

## 2019-09-13 NOTE — Telephone Encounter (Signed)
Patient is returning the call. CB is [phone]

## 2019-09-25 ENCOUNTER — Other Ambulatory Visit: Payer: Self-pay | Admitting: Obstetrics and Gynecology

## 2019-09-25 DIAGNOSIS — I6523 Occlusion and stenosis of bilateral carotid arteries: Secondary | ICD-10-CM

## 2019-10-02 ENCOUNTER — Other Ambulatory Visit: Payer: Self-pay | Admitting: Obstetrics and Gynecology

## 2019-10-02 ENCOUNTER — Ambulatory Visit
Admission: RE | Admit: 2019-10-02 | Discharge: 2019-10-02 | Disposition: A | Discharge status: Home / Self Care | Payer: BC Managed Care – PPO | Source: Ambulatory Visit | Attending: Obstetrics and Gynecology | Admitting: Obstetrics and Gynecology

## 2019-10-02 DIAGNOSIS — I6523 Occlusion and stenosis of bilateral carotid arteries: Secondary | ICD-10-CM

## 2019-10-06 ENCOUNTER — Other Ambulatory Visit: Payer: BC Managed Care – PPO

## 2019-10-09 LAB — BASIC METABOLIC PANEL
BUN: 18 (ref 4–21)
CO2: 28 — AB (ref 13–22)
Chloride: 99 (ref 99–108)
Creatinine: 0.9 (ref 0.6–1.3)
Glucose: 99
Potassium: 4.7 (ref 3.4–5.3)
Sodium: 138 (ref 137–147)

## 2019-10-09 LAB — TESTOSTERONE: Testosterone: 249

## 2019-10-09 LAB — CBC: RBC: 5.71 — AB (ref 3.87–5.11)

## 2019-10-09 LAB — HEMOGLOBIN A1C: Hemoglobin A1C: 5.8

## 2019-10-09 LAB — COMPREHENSIVE METABOLIC PANEL
Albumin: 4.4 (ref 3.5–5.0)
Calcium: 10 (ref 8.7–10.7)
GFR calc Af Amer: 107
GFR calc non Af Amer: 93
Globulin: 2.3

## 2019-10-09 LAB — CBC AND DIFFERENTIAL
HCT: 56 — AB (ref 41–53)
Hemoglobin: 18.9 — AB (ref 13.5–17.5)
Neutrophils Absolute: 79
Platelets: 196 (ref 150–399)
WBC: 7.5

## 2019-10-09 LAB — LIPID PANEL
Cholesterol: 203 — AB (ref 0–200)
HDL: 52 (ref 35–70)
LDL Cholesterol: 125
Triglycerides: 144 (ref 40–160)

## 2019-10-09 LAB — VITAMIN D 25 HYDROXY (VIT D DEFICIENCY, FRACTURES): Vit D, 25-Hydroxy: 27.2

## 2019-10-12 ENCOUNTER — Ambulatory Visit
Admission: RE | Admit: 2019-10-12 | Discharge: 2019-10-12 | Disposition: A | Discharge status: Home / Self Care | Payer: BC Managed Care – PPO | Source: Ambulatory Visit | Attending: Obstetrics and Gynecology | Admitting: Obstetrics and Gynecology

## 2019-10-12 DIAGNOSIS — I6523 Occlusion and stenosis of bilateral carotid arteries: Secondary | ICD-10-CM

## 2019-10-19 ENCOUNTER — Telehealth: Payer: Self-pay | Admitting: Emergency Medicine

## 2019-10-19 NOTE — Telephone Encounter (Signed)
I spoke with Dr. Lisbeth Ply, reviewed his CT result from 02/2019, discussed the physical findings.  No believe is any connection between the pulmonary nodule and left upper back lesion.  She is going to follow this clinically.  I will see him in office as planned

## 2019-10-19 NOTE — Telephone Encounter (Signed)
Dr Lisbeth Ply calling wanting to speak to Dr Lamonte Sakai concern a soft mass she had foundon this pt and wanting to know if it has anything to do with the pulmonary mass he found she can be reached @ [phone].  Wants to speak directly to [name]. Pt is being see on 10/20/2019.    [name] please advise. THanks.

## 2019-10-20 ENCOUNTER — Ambulatory Visit (INDEPENDENT_AMBULATORY_CARE_PROVIDER_SITE_OTHER): Payer: BC Managed Care – PPO | Admitting: Emergency Medicine

## 2019-10-20 ENCOUNTER — Other Ambulatory Visit: Payer: Self-pay

## 2019-10-20 ENCOUNTER — Encounter: Payer: Self-pay | Admitting: Emergency Medicine

## 2019-10-20 DIAGNOSIS — R911 Solitary pulmonary nodule: Secondary | ICD-10-CM

## 2019-10-20 DIAGNOSIS — J449 Chronic obstructive pulmonary disease, unspecified: Secondary | ICD-10-CM | POA: Diagnosis not present

## 2019-10-20 DIAGNOSIS — J302 Other seasonal allergic rhinitis: Secondary | ICD-10-CM

## 2019-10-20 DIAGNOSIS — F17210 Nicotine dependence, cigarettes, uncomplicated: Secondary | ICD-10-CM

## 2019-10-20 DIAGNOSIS — G4733 Obstructive sleep apnea (adult) (pediatric): Secondary | ICD-10-CM | POA: Diagnosis not present

## 2019-10-20 NOTE — Assessment & Plan Note (Signed)
Untreated  

## 2019-10-20 NOTE — Assessment & Plan Note (Signed)
Continue your Astelin/fluticasone nasal spray as needed for congestion

## 2019-10-20 NOTE — Patient Instructions (Addendum)
Please continue to take your Spiriva and [facility] as you have been doing.  Rinse and gargle after the [facility]. Keep your albuterol available to use either one nebulizer treatment or 2 puffs up to every 4 hours if needed for shortness of breath, chest tightness, wheezing. We will decrease your prednisone to 3 mg daily.  Stay on this dose until you follow-up with [place] in the office.  If your breathing worsens with this change please call us.  We may need to go back to higher dose. You would probably benefit from the flu shot.  Please let us know if you change your mind about getting this. Handicap parking license paperwork filled out today. Please try starting guaifenesin (Mucinex) 600 mg twice a day to see if this helps you with mucus clearance. Continue your Astelin/fluticasone nasal spray as needed for congestion You need to work hard on decreasing and ultimately stopping your smoking. We will repeat your low-dose CT scan of the chest for lung cancer screening in March 2021. Follow with [name] in 2 months or sooner if you have any problems.

## 2019-10-20 NOTE — Assessment & Plan Note (Signed)
Plan for follow-up low-dose CT scan in March 2021, reviewed results together.  He is at significant risk for primary lung cancer given his continued smoking.

## 2019-10-20 NOTE — Assessment & Plan Note (Signed)
You need to work hard on decreasing and ultimately stopping your smoking. We will repeat your low-dose CT scan of the chest for lung cancer screening in March 2021.

## 2019-10-20 NOTE — Assessment & Plan Note (Signed)
Please continue to take your Spiriva and [facility] as you have been doing.  Rinse and gargle after the [facility]. Keep your albuterol available to use either one nebulizer treatment or 2 puffs up to every 4 hours if needed for shortness of breath, chest tightness, wheezing. We will decrease your prednisone to 3 mg daily.  Stay on this dose until you follow-up with [place] in the office.  If your breathing worsens with this change please call us.  We may need to go back to higher dose. You would probably benefit from the flu shot.  Please let us know if you change your mind about getting this. Handicap parking license paperwork filled out today. Please try starting guaifenesin (Mucinex) 600 mg twice a day to see if this helps you with mucus clearance. Follow with Robert Church in 2 months or sooner if you have any problems.

## 2019-10-20 NOTE — Progress Notes (Signed)
Patient her to reestablish care  Subjective:  Patient ID: Robert Church, male    DOB: February 02, 1956  Age: 63 y.o. MRN: [id number]  CC:  Chief Complaint  Patient presents with  . Follow-up    HPI BALDWIN Robert Church presents for evaluation of his chronic COPD. He has a prominent PMH notable for HTN, CHF w/ LVER of 30%, CAD, HLD, tobacco use disorder, and stage COPD GOLD 1D. He had been following with Dr. Girard Cooter of [facility] for his COPD after moving to [facility] He is now moving back to the area and wishes to reestablish care. He noted persistent dyspnea with walking greater than 100 feet. He is unable to take stairs, hills or walk in the heat. He has persistent cough and heavy sputum production in the mornings that lasts for hours. He notes several exacerbations over the past 6 months but did not need to be hospitalized for these.   He continue to smoke 6-8 per day.   He continues to take prednisone 5mg  daily which he feels helps. Last exacerbation 04/2019.  He stopped taking daliresp as he did not feel this was helping.   He had a CT in 02/2019 for cancer screening which showed a possible 9x63mm nodule in the RLL that could favor mucus plugging. Repeat CT in March 2021 Recommended.  ROV 10/20/2019 --this is a follow-up visit for [name], 63 year old man with severe COPD and a significant chronic bronchitic phenotype.  He returned to our practice from [facility] back in August.  He continues to smoke, currently about 10 cigarettes daily.  He is on chronic prednisone 5 mg daily, Dulera and Spiriva.  He had a 9 mm right lower lobe nodule versus mucous plugging on lung cancer screening CT from March 2020 and we are planning for a repeat low-dose scan in March 2021.  He also has sleep apnea which is not currently treated. He returns today reporting that he has ups and downs with his breathing. Some days he has episodic dyspnea either at rest or w exertion. He has cough productive of clear to  light yellow. Uses albuterol 3-5 x a day. Uses astelin - fluticasone prn.     ROS Review of Systems  Constitutional: Positive for activity change (Increasing heat decreases activity). Negative for fatigue, fever and unexpected weight change.  HENT: Negative for congestion, postnasal drip, rhinorrhea and sinus pain.   Eyes: Negative for redness.  Respiratory: Positive for apnea, cough, shortness of breath and wheezing. Negative for choking, chest tightness and stridor.   Cardiovascular: Positive for leg swelling. Negative for chest pain and palpitations.  Gastrointestinal: Positive for abdominal distention. Negative for abdominal pain, diarrhea and nausea.  Endocrine: Negative for polyuria.  Genitourinary: Negative for dysuria and hematuria.  Musculoskeletal: Negative for back pain, gait problem and myalgias.  Skin: Negative for rash and wound.  Neurological: Negative for dizziness, seizures, weakness and headaches.  Psychiatric/Behavioral: Negative for agitation and self-injury.    Objective:   Today's Vitals: BP (!) 150/96 (BP Location: Right Arm, Cuff Size: Normal)   Pulse 78   Ht 5\' 10"  (1.778 m)   Wt 227 lb (103 kg)   SpO2 92%   BMI 32.57 kg/m     Assessment & Plan:   Problem List Items Addressed This Visit    Seasonal allergic rhinitis    Continue your Astelin/fluticasone nasal spray as needed for congestion      Sleep apnea    Untreated  COPD, group D, by GOLD 2017 classification ([place])    Please continue to take your Spiriva and [facility] as you have been doing.  Rinse and gargle after the [facility]. Keep your albuterol available to use either one nebulizer treatment or 2 puffs up to every 4 hours if needed for shortness of breath, chest tightness, wheezing. We will decrease your prednisone to 3 mg daily.  Stay on this dose until you follow-up with [place] in the office.  If your breathing worsens with this change please call us.  We may need to go back to higher dose.  You would probably benefit from the flu shot.  Please let us know if you change your mind about getting this. Handicap parking license paperwork filled out today. Please try starting guaifenesin (Mucinex) 600 mg twice a day to see if this helps you with mucus clearance. Follow with [name] in 2 months or sooner if you have any problems.       Cigarette smoker    You need to work hard on decreasing and ultimately stopping your smoking. We will repeat your low-dose CT scan of the chest for lung cancer screening in March 2021.       Solitary pulmonary nodule    Plan for follow-up low-dose CT scan in March 2021, reviewed results together.  He is at significant risk for primary lung cancer given his continued smoking.         Outpatient Encounter Medications as of 10/20/2019  Medication Sig  . albuterol (PROVENTIL HFA;VENTOLIN HFA) 108 (90 BASE) MCG/ACT inhaler Inhale 2 puffs into the lungs every 6 (six) hours as needed for wheezing.  Marland Kitchen albuterol (PROVENTIL) (2.5 MG/3ML) 0.083% nebulizer solution Take 3 mLs (2.5 mg total) by nebulization every 6 (six) hours as needed for wheezing or shortness of breath.  . allopurinol (ZYLOPRIM) 100 MG tablet Take 200 mg by mouth daily.  Marland Kitchen ALPRAZolam (XANAX) 1 MG tablet Take 0.5-2 mg by mouth See admin instructions. 0.5 mg in the morning and afternoon and take 2mg  at bedtime  . amLODipine (NORVASC) 10 MG tablet Take 10 mg by mouth daily.   Marland Kitchen aspirin EC 81 MG tablet Take 1 tablet (81 mg total) by mouth daily.  . Aspirin-Salicylamide-Caffeine (BC FAST PAIN RELIEF) 650-195-33.3 MG PACK Take 1 packet by mouth every 8 (eight) hours as needed (for knee pain.).  . Azelastine-Fluticasone 137-50 MCG/ACT SUSP 1 spray each nostril twice daily  . cholecalciferol (VITAMIN D) 1000 units tablet Take 1,000 Units by mouth daily.  . furosemide (LASIX) 20 MG tablet Take 20 mg by mouth every morning.   Marland Kitchen losartan (COZAAR) 100 MG tablet Take 100 mg by mouth daily.  .  mometasone-formoterol (DULERA) 200-5 MCG/ACT AERO Take 2 puffs first thing in am and then another 2 puffs about 12 hours later.  Marland Kitchen omeprazole (PRILOSEC) 20 MG capsule Take 20 mg by mouth 2 (two) times daily. ACID REFLUX  . predniSONE (DELTASONE) 10 MG tablet Take 5 mg by mouth daily with breakfast.  . spironolactone (ALDACTONE) 50 MG tablet Take 1 tablet (50 mg total) by mouth at bedtime.  . Testosterone 30 MG/ACT SOLN Apply 1 application topically daily. Apply under each arm daily  . Tiotropium Bromide Monohydrate (SPIRIVA RESPIMAT) 2.5 MCG/ACT AERS Inhale 2.5 mcg into the lungs 2 (two) times daily.   No facility-administered encounter medications on file as of 10/20/2019.     Follow-up: No follow-ups on file.   COPD, group D, by GOLD 2017 classification ([place])  Please continue to take your Spiriva and [facility] as you have been doing.  Rinse and gargle after the [facility]. Keep your albuterol available to use either one nebulizer treatment or 2 puffs up to every 4 hours if needed for shortness of breath, chest tightness, wheezing. We will decrease your prednisone to 3 mg daily.  Stay on this dose until you follow-up with [place] in the office.  If your breathing worsens with this change please call us.  We may need to go back to higher dose. You would probably benefit from the flu shot.  Please let us know if you change your mind about getting this. Handicap parking license paperwork filled out today. Please try starting guaifenesin (Mucinex) 600 mg twice a day to see if this helps you with mucus clearance. Follow with [name] in 2 months or sooner if you have any problems.   Cigarette smoker You need to work hard on decreasing and ultimately stopping your smoking. We will repeat your low-dose CT scan of the chest for lung cancer screening in March 2021.   Solitary pulmonary nodule Plan for follow-up low-dose CT scan in March 2021, reviewed results together.  He is at significant risk for primary  lung cancer given his continued smoking.  Sleep apnea Untreated  Seasonal allergic rhinitis Continue your Astelin/fluticasone nasal spray as needed for congestion    Collene Gobble, MD

## 2019-10-23 ENCOUNTER — Telehealth: Payer: Self-pay | Admitting: Emergency Medicine

## 2019-10-23 NOTE — Telephone Encounter (Signed)
Call returned to Dr. Laural Benes. She is requesting to speak to [name] directly regarding this patient and his medication (xanax).   RB please give Dr. Laural Benes a call at [phone], this is her direct number.

## 2019-10-24 ENCOUNTER — Telehealth: Payer: Self-pay | Admitting: Emergency Medicine

## 2019-10-24 MED ORDER — PREDNISONE 1 MG PO TABS: 3.0000 mg | ORAL_TABLET | Freq: Every day | ORAL | 2 refills | Status: DC

## 2019-10-24 NOTE — Telephone Encounter (Signed)
lmtcb for pt.  

## 2019-10-24 NOTE — Telephone Encounter (Signed)
Patient was seen by RB on 10/30 per instructions patient to change to 3 mg daily until seen back in office 2 months.   patient states pharmacy never got order.  Order placed today and let patient know. Nothing further needed at this time

## 2019-10-24 NOTE — Telephone Encounter (Signed)
Patient is returning phone call. Patient phone number is [phone]. 

## 2019-10-24 NOTE — Telephone Encounter (Signed)
Spoke with her this am. I support minimizing his sedating meds including benzos as much as possible.

## 2019-11-13 ENCOUNTER — Telehealth: Payer: Self-pay | Admitting: Cardiovascular Disease

## 2019-11-13 NOTE — Telephone Encounter (Signed)
Called patient- he states that he has had some increased swelling and redness in his legs. He states that for the past few days he has noticed some SOB, denies chest pain at this time- he does mention that he has been eating some salty foods recently- I did advise him to cut back on those, elevate his legs, and do compression stockings. He did go to the hospital but left because there was 51 people in front of him and he was not waiting. Patient does mention having some weight gain as well. He is taking his medications as prescribed- taking lasix 20 mg daily, but questioning if okay to increased for a few days to see if any changes, advised I would check with Dr.Berry to make sure if okay, and what to increase too.   Patient was asking if any sooner appointment- advised I did not see anything, but would also send nurse a message to advise.  Will route to MD and nurse for recommendations.

## 2019-11-13 NOTE — Telephone Encounter (Signed)
New Message   Pt c/o swelling: STAT is pt has developed SOB within 24 hours  1) How much weight have you gained and in what time span? 7 lbs since the last time he weighed 3 weeks ago  2) If swelling, where is the swelling located? Feet and legs, states that legs are also red.  3) Are you currently taking a fluid pill? Yes  4) Are you currently SOB? Yes, COPD.  5) Do you have a log of your daily weights (if so, list)? No  6) Have you gained 3 pounds in a day or 5 pounds in a week? Yes  7) Have you traveled recently? No   Scheduled patient with Dr. Gwenlyn Found on 11/24/19 at 2:00pm

## 2019-11-13 NOTE — ED Notes (Signed)
Dr Laural Benes, the provider at [facility], called with advance notification. Per provider, pt has bilateral leg swelling and she is concerned that pt has compartment syndrome. Pt has had swelling and redness x 1.5 weeks.

## 2019-11-14 NOTE — Telephone Encounter (Signed)
Please call and schedule with APP next week. Thank you!

## 2019-11-14 NOTE — Telephone Encounter (Signed)
Have patient come in to see an APP sometime within the next week.

## 2019-11-15 ENCOUNTER — Encounter: Payer: Self-pay | Admitting: Cardiovascular Disease

## 2019-11-15 ENCOUNTER — Other Ambulatory Visit: Payer: Self-pay

## 2019-11-15 ENCOUNTER — Other Ambulatory Visit: Payer: Self-pay | Admitting: *Deleted

## 2019-11-15 ENCOUNTER — Ambulatory Visit (INDEPENDENT_AMBULATORY_CARE_PROVIDER_SITE_OTHER): Payer: BC Managed Care – PPO | Admitting: Cardiovascular Disease

## 2019-11-15 VITALS — BP 122/78 | HR 97 | Ht 70.0 in | Wt 227.0 lb

## 2019-11-15 DIAGNOSIS — I10 Essential (primary) hypertension: Secondary | ICD-10-CM

## 2019-11-15 DIAGNOSIS — I428 Other cardiomyopathies: Secondary | ICD-10-CM | POA: Diagnosis not present

## 2019-11-15 MED ORDER — FUROSEMIDE 40 MG PO TABS: 40.0000 mg | ORAL_TABLET | Freq: Two times a day (BID) | ORAL | 1 refills | Status: DC

## 2019-11-15 NOTE — Assessment & Plan Note (Signed)
History of obstructive sleep apnea on CPAP. 

## 2019-11-15 NOTE — Assessment & Plan Note (Signed)
History of normal coronary arteries by heart cath performed 04/19/2017.

## 2019-11-15 NOTE — Patient Instructions (Signed)
Medication Instructions:  INCREASE the Furosemide to 40 mg twice daily  *If you need a refill on your cardiac medications before your next appointment, please call your pharmacy*  Lab Work: Your provider would like for you to return in one week to have the following labs drawn: BMET. You do not need an appointment for the lab. Once in our office lobby there is a podium where you can sign in and ring the doorbell to alert [place] that you are here. The lab is open from 8:00 am to 4:30 pm; closed for lunch from 12:45pm-1:45pm.  If you have labs (blood work) drawn today and your tests are completely normal, you will receive your results only by: Marland Kitchen MyChart Message (if you have MyChart) OR . A paper copy in the mail If you have any lab test that is abnormal or we need to change your treatment, we will call you to review the results.  Testing/Procedures: Your physician has requested that you have an echocardiogram. Echocardiography is a painless test that uses sound waves to create images of your heart. It provides your doctor with information about the size and shape of your heart and how well your heart's chambers and valves are working. You may receive an ultrasound enhancing agent through an IV if needed to better visualize your heart during the echo.This procedure takes approximately one hour. There are no restrictions for this procedure. This will take place at the [address], [address].    Follow-Up: At [facility], you and your health needs are our priority.  As part of our continuing mission to provide you with exceptional heart care, we have created designated Provider Care Teams.  These Care Teams include your primary Cardiologist (physician) and Advanced Practice Providers (APPs -  Physician Assistants and Nurse Practitioners) who all work together to provide you with the care you need, when you need it.  Your next appointment:   Follow up in one week with an APP Follow up in 3-4  weeks with Dr. Gwenlyn Found

## 2019-11-15 NOTE — Progress Notes (Signed)
11/15/2019 Robert Church   08-27-1956  [id number]  Primary Physician Hamrick, Lorin Mercy, MD Primary Cardiologist: [name] MD Garret Reddish, [place], [place]  HPI:  Robert Church is a 63 y.o.   severely overweight divorced Caucasian male father of 1 grandchild who works in [employer]. He was referred because of chest pain by Dr. Lisbeth Ply for cardiovascular evaluation.I last saw him in the 02/23/2019.  His mother is at [facility], one of my long-term patients as well. He saw Dr. Johnsie Cancel in the past. His risk factors include ongoing tobacco abuse, hypertension and family history.  He never had a heart attack or stroke. He did have a heart catheterization performed by Dr. Tamala Julian on 04/19/2017 that showed normal coronary arteries with an EF of 40% consistent with a nonischemic cardia myopathy.A 2D echo performed 07/07/2018 showed an EF of 30 to 35%.  I referred him to Dr. Haroldine Laws in the advanced heart failure clinic who changed his carvedilol to Zebeta and his losartan to [facility]. Unfortunately, he did not tolerate the Entresto and he was changed back to losartan.  He does have obstructive sleep apnea on CPAP which he wears intermittently. He continues to exhibit class II/III symptoms.  Since I saw him 8 months ago he did well until several weeks ago.  He did stop smoking 3 weeks ago.  He is noticed increasing dyspnea on exertion, weight gain and lower extremity edema despite not eating salt by his account.   Current Meds  Medication Sig  . albuterol (PROVENTIL HFA;VENTOLIN HFA) 108 (90 BASE) MCG/ACT inhaler Inhale 2 puffs into the lungs every 6 (six) hours as needed for wheezing.  Marland Kitchen albuterol (PROVENTIL) (2.5 MG/3ML) 0.083% nebulizer solution Take 3 mLs (2.5 mg total) by nebulization every 6 (six) hours as needed for wheezing or shortness of breath.  . allopurinol (ZYLOPRIM) 100 MG tablet Take 200 mg by mouth daily.  Marland Kitchen ALPRAZolam (XANAX) 1 MG tablet Take 0.5-2 mg by mouth  See admin instructions. 0.5 mg in the morning and afternoon and take 2mg  at bedtime  . amLODipine (NORVASC) 10 MG tablet Take 10 mg by mouth daily.   Marland Kitchen aspirin EC 81 MG tablet Take 1 tablet (81 mg total) by mouth daily.  . Aspirin-Salicylamide-Caffeine (BC FAST PAIN RELIEF) 650-195-33.3 MG PACK Take 1 packet by mouth every 8 (eight) hours as needed (for knee pain.).  . Azelastine-Fluticasone 137-50 MCG/ACT SUSP 1 spray each nostril twice daily  . cholecalciferol (VITAMIN D) 1000 units tablet Take 1,000 Units by mouth daily.  . furosemide (LASIX) 20 MG tablet Take 20 mg by mouth every morning.   Marland Kitchen losartan (COZAAR) 100 MG tablet Take 100 mg by mouth daily.  . mometasone-formoterol (DULERA) 200-5 MCG/ACT AERO Take 2 puffs first thing in am and then another 2 puffs about 12 hours later.  Marland Kitchen omeprazole (PRILOSEC) 20 MG capsule Take 20 mg by mouth 2 (two) times daily. ACID REFLUX  . predniSONE (DELTASONE) 1 MG tablet Take 3 tablets (3 mg total) by mouth daily with breakfast.  . spironolactone (ALDACTONE) 50 MG tablet Take 1 tablet (50 mg total) by mouth at bedtime.  . Testosterone 30 MG/ACT SOLN Apply 1 application topically daily. Apply under each arm daily  . Tiotropium Bromide Monohydrate (SPIRIVA RESPIMAT) 2.5 MCG/ACT AERS Inhale 2.5 mcg into the lungs 2 (two) times daily.     Allergies  Allergen Reactions  . Clindamycin Other (See Comments)    Tongue swelling  .  E-Mycin [Erythromycin Base] Swelling  . Penicillins Swelling and Other (See Comments)    Childhood rxn--MD stated he "almost died" Has patient had a PCN reaction causing immediate rash, facial/tongue/throat swelling, SOB or lightheadedness with hypotension:Yes Has patient had a PCN reaction causing severe rash involving mucus membranes or skin necrosis:unsure Has patient had a PCN reaction that required hospitalization:unsure Has patient had a PCN reaction occurring within the last 10 years:No If all of the above answers are "NO",  then may proceed with Cephalosporin use.    . Rosuvastatin     Other reaction(s): Myalgias (intolerance)    Social History   Socioeconomic History  . Marital status: Single    Spouse name: Not on file  . Number of children: Not on file  . Years of education: Not on file  . Highest education level: Not on file  Occupational History  . Not on file  Social Needs  . Financial resource strain: Not on file  . Food insecurity    Worry: Not on file    Inability: Not on file  . Transportation needs    Medical: Not on file    Non-medical: Not on file  Tobacco Use  . Smoking status: Current Every Day Smoker    Packs/day: 0.50    Years: 46.00    Pack years: 23.00    Types: Cigarettes  . Smokeless tobacco: Never Used  Substance and Sexual Activity  . Alcohol use: No    Alcohol/week: 0.0 standard drinks  . Drug use: No  . Sexual activity: Yes    Birth control/protection: None  Lifestyle  . Physical activity    Days per week: Not on file    Minutes per session: Not on file  . Stress: Not on file  Relationships  . Social Herbalist on phone: Not on file    Gets together: Not on file    Attends religious service: Not on file    Active member of club or organization: Not on file    Attends meetings of clubs or organizations: Not on file    Relationship status: Not on file  . Intimate partner violence    Fear of current or ex partner: Not on file    Emotionally abused: Not on file    Physically abused: Not on file    Forced sexual activity: Not on file  Other Topics Concern  . Not on file  Social History Narrative  . Not on file     Review of Systems: General: negative for chills, fever, night sweats or weight changes.  Cardiovascular: negative for chest pain, dyspnea on exertion, edema, orthopnea, palpitations, paroxysmal nocturnal dyspnea or shortness of breath Dermatological: negative for rash Respiratory: negative for cough or wheezing Urologic: negative  for hematuria Abdominal: negative for nausea, vomiting, diarrhea, bright red blood per rectum, melena, or hematemesis Neurologic: negative for visual changes, syncope, or dizziness All other systems reviewed and are otherwise negative except as noted above.    Blood pressure 122/78, pulse 97, height 5\' 10"  (1.778 m), weight 227 lb (103 kg), SpO2 (!) 87 %.  General appearance: alert and no distress Neck: no adenopathy, no carotid bruit, no JVD, supple, symmetrical, trachea midline and thyroid not enlarged, symmetric, no tenderness/mass/nodules Lungs: Scattered by lateral expiratory wheezes. Heart: regular rate and rhythm, S1, S2 normal, no murmur, click, rub or gallop Extremities: 3+ pitting edema bilaterally with with some superimposed erythema Pulses: 2+ and symmetric Skin: Erythematous changes bilaterally Neurologic: Alert  and oriented X 3, normal strength and tone. Normal symmetric reflexes. Normal coordination and gait  EKG sinus rhythm at 97 with right bundle branch block. I  Personally reviewed this EKG.  ASSESSMENT AND PLAN:   HTN (hypertension) History of essential hypertension blood pressure measured today 122/78.  He is on amlodipine 10 mg a day losartan 100 mg a day.  Sleep apnea History of obstructive sleep apnea on CPAP  Cigarette smoker History of recently discontinued tobacco abuse 3 weeks ago  CAD (coronary artery disease) History of normal coronary arteries by heart cath performed 04/19/2017.  Nonischemic cardiomyopathy ([place]) History of nonischemic cardiomyopathy with ejection fraction by 2D echo measured 12/22/2018 40 to 45%.  He has had increasing dyspnea on exertion, weight gain and 2-3+ pitting edema.  He does restrict his salt intake.  I am going to put him on 40 mg of Lasix p.o. twice daily and get a basic metabolic panel in 1 week.  He will see an APP back after that and me back in 4 weeks.      [name] MD FACP,FACC,FAHA, [facility] 11/15/2019 4:12  PM

## 2019-11-15 NOTE — Assessment & Plan Note (Signed)
History of essential hypertension blood pressure measured today 122/78.  He is on amlodipine 10 mg a day losartan 100 mg a day.

## 2019-11-15 NOTE — Assessment & Plan Note (Signed)
History of nonischemic cardiomyopathy with ejection fraction by 2D echo measured 12/22/2018 40 to 45%.  He has had increasing dyspnea on exertion, weight gain and 2-3+ pitting edema.  He does restrict his salt intake.  I am going to put him on 40 mg of Lasix p.o. twice daily and get a basic metabolic panel in 1 week.  He will see an APP back after that and me back in 4 weeks.

## 2019-11-15 NOTE — Assessment & Plan Note (Signed)
History of recently discontinued tobacco abuse 3 weeks ago

## 2019-11-22 ENCOUNTER — Ambulatory Visit (INDEPENDENT_AMBULATORY_CARE_PROVIDER_SITE_OTHER): Payer: BC Managed Care – PPO | Admitting: Cardiology

## 2019-11-22 ENCOUNTER — Other Ambulatory Visit: Payer: Self-pay

## 2019-11-22 ENCOUNTER — Encounter: Payer: Self-pay | Admitting: Cardiology

## 2019-11-22 VITALS — BP 133/82 | HR 88 | Temp 97.3°F | Ht 70.0 in | Wt 230.0 lb

## 2019-11-22 DIAGNOSIS — G4733 Obstructive sleep apnea (adult) (pediatric): Secondary | ICD-10-CM

## 2019-11-22 DIAGNOSIS — I5022 Chronic systolic (congestive) heart failure: Secondary | ICD-10-CM

## 2019-11-22 DIAGNOSIS — I10 Essential (primary) hypertension: Secondary | ICD-10-CM

## 2019-11-22 DIAGNOSIS — I25119 Atherosclerotic heart disease of native coronary artery with unspecified angina pectoris: Secondary | ICD-10-CM

## 2019-11-22 DIAGNOSIS — R600 Localized edema: Secondary | ICD-10-CM | POA: Diagnosis not present

## 2019-11-22 DIAGNOSIS — I428 Other cardiomyopathies: Secondary | ICD-10-CM

## 2019-11-22 DIAGNOSIS — J449 Chronic obstructive pulmonary disease, unspecified: Secondary | ICD-10-CM | POA: Diagnosis not present

## 2019-11-22 MED ORDER — FUROSEMIDE 80 MG PO TABS: 80.0000 mg | ORAL_TABLET | Freq: Two times a day (BID) | ORAL | 1 refills | Status: DC

## 2019-11-22 NOTE — Assessment & Plan Note (Signed)
Followed by [place] [facility]  

## 2019-11-22 NOTE — Assessment & Plan Note (Signed)
Fair control.

## 2019-11-22 NOTE — Assessment & Plan Note (Signed)
60% Dx1 by cath April 2018- no other CAD

## 2019-11-22 NOTE — Assessment & Plan Note (Signed)
Nonischemic cardiomyopathy-EF 40-45% Jan 2020- repeat echo ordered

## 2019-11-22 NOTE — Progress Notes (Signed)
Cardiology Office Note:    Date:  11/22/2019   ID:  Robert Church, DOB 08-16-1956, MRN [id number]  PCP:  Leonides Sake, MD  Cardiologist:  Quay Burow, MD  Electrophysiologist:  None   Referring MD: Leonides Sake, MD   CC: LE edema  History of Present Illness:    Robert Church is a 63 y.o. male with a hx of normal LVF with grade 1 DD and mild LVH by echo 12/22/2018.  He has normal coronaries by prior cath April 2018.  He has OSA and is on C-pap. He has HTN and is on Amlodipine, Aldactone, and Losartan.  He was in the office to see Dr Gwenlyn Found 11/15/2019 with complaints of weight gain, DOE, and LE edema.  Dr Gwenlyn Found added Lasix 40 mg BID and he is seen today in f/u.    The pat says his edema is unchanged and very painful. His weight is actually up 3 lbs.  He reports compliance with Lasix and C-pap. He is scheduled to have lab work tomorrow.   Past Medical History:  Diagnosis Date  . Allergy   . Asthma   . Bronchitis   . COPD (chronic obstructive pulmonary disease) ([place])   . GERD (gastroesophageal reflux disease)   . Hypertension   . Pneumonia   . Sinusitis   . SOB (shortness of breath)     Past Surgical History:  Procedure Laterality Date  . LEFT HEART CATH AND CORONARY ANGIOGRAPHY N/A 04/19/2017   Procedure: Left Heart Cath and Coronary Angiography;  Surgeon: Belva Crome, MD;  Location: [facility];  Service: Cardiovascular;  Laterality: N/A;  . TONSILLECTOMY      Current Medications: Current Meds  Medication Sig  . albuterol (PROVENTIL HFA;VENTOLIN HFA) 108 (90 BASE) MCG/ACT inhaler Inhale 2 puffs into the lungs every 6 (six) hours as needed for wheezing.  Marland Kitchen albuterol (PROVENTIL) (2.5 MG/3ML) 0.083% nebulizer solution Take 3 mLs (2.5 mg total) by nebulization every 6 (six) hours as needed for wheezing or shortness of breath.  . allopurinol (ZYLOPRIM) 100 MG tablet Take 200 mg by mouth daily.  Marland Kitchen ALPRAZolam (XANAX) 1 MG tablet Take 0.5-2 mg by mouth See admin  instructions. 0.5 mg in the morning and afternoon and take 2mg  at bedtime  . aspirin EC 81 MG tablet Take 1 tablet (81 mg total) by mouth daily.  . Aspirin-Salicylamide-Caffeine (BC FAST PAIN RELIEF) 650-195-33.3 MG PACK Take 1 packet by mouth every 8 (eight) hours as needed (for knee pain.).  . Azelastine-Fluticasone 137-50 MCG/ACT SUSP 1 spray each nostril twice daily  . cholecalciferol (VITAMIN D) 1000 units tablet Take 1,000 Units by mouth daily.  . furosemide (LASIX) 80 MG tablet Take 1 tablet (80 mg total) by mouth 2 (two) times daily.  Marland Kitchen losartan (COZAAR) 100 MG tablet Take 100 mg by mouth daily.  . mometasone-formoterol (DULERA) 200-5 MCG/ACT AERO Take 2 puffs first thing in am and then another 2 puffs about 12 hours later.  Marland Kitchen omeprazole (PRILOSEC) 20 MG capsule Take 20 mg by mouth 2 (two) times daily. ACID REFLUX  . predniSONE (DELTASONE) 1 MG tablet Take 3 tablets (3 mg total) by mouth daily with breakfast.  . spironolactone (ALDACTONE) 50 MG tablet Take 1 tablet (50 mg total) by mouth at bedtime.  . Testosterone 30 MG/ACT SOLN Apply 1 application topically daily. Apply under each arm daily  . Tiotropium Bromide Monohydrate (SPIRIVA RESPIMAT) 2.5 MCG/ACT AERS Inhale 2.5 mcg into the lungs 2 (  two) times daily.  . [DISCONTINUED] amLODipine (NORVASC) 10 MG tablet Take 10 mg by mouth daily.   . [DISCONTINUED] furosemide (LASIX) 40 MG tablet Take 1 tablet (40 mg total) by mouth 2 (two) times daily.     Allergies:   Clindamycin, E-mycin [erythromycin base], Penicillins, and Rosuvastatin   Social History   Socioeconomic History  . Marital status: Single    Spouse name: Not on file  . Number of children: Not on file  . Years of education: Not on file  . Highest education level: Not on file  Occupational History  . Not on file  Social Needs  . Financial resource strain: Not on file  . Food insecurity    Worry: Not on file    Inability: Not on file  . Transportation needs     Medical: Not on file    Non-medical: Not on file  Tobacco Use  . Smoking status: Current Every Day Smoker    Packs/day: 0.50    Years: 46.00    Pack years: 23.00    Types: Cigarettes  . Smokeless tobacco: Never Used  Substance and Sexual Activity  . Alcohol use: No    Alcohol/week: 0.0 standard drinks  . Drug use: No  . Sexual activity: Yes    Birth control/protection: None  Lifestyle  . Physical activity    Days per week: Not on file    Minutes per session: Not on file  . Stress: Not on file  Relationships  . Social Herbalist on phone: Not on file    Gets together: Not on file    Attends religious service: Not on file    Active member of club or organization: Not on file    Attends meetings of clubs or organizations: Not on file    Relationship status: Not on file  Other Topics Concern  . Not on file  Social History Narrative  . Not on file     Family History: The patient's family history includes Brain cancer in his father; Lung cancer in his father.  ROS:   Please see the history of present illness.     All other systems reviewed and are negative.  EKGs/Labs/Other Studies Reviewed:    The following studies were reviewed today: Echo Jan 2020  Recent Labs: 02/15/2019: BUN 15; Creatinine, Ser 0.96; Potassium 4.0; Sodium 139  Recent Lipid Panel    Component Value Date/Time   CHOL 197 05/12/2016 0905   TRIG 350 (H) 05/12/2016 0905   HDL 36 (L) 05/12/2016 0905   CHOLHDL 5.5 (H) 05/12/2016 0905   VLDL 70 (H) 05/12/2016 0905   LDLCALC 91 05/12/2016 0905    Physical Exam:    VS:  BP 133/82   Pulse 88   Temp (!) 97.3 F (36.3 C)   Ht 5\' 10"  (1.778 m)   Wt 230 lb (104.3 kg)   SpO2 100%   BMI 33.00 kg/m     Wt Readings from Last 3 Encounters:  11/22/19 230 lb (104.3 kg)  11/15/19 227 lb (103 kg)  10/20/19 227 lb (103 kg)     GEN: Overweight male, well developed in no acute distress HEENT: Normal NECK: No JVD; No carotid bruits CARDIAC:  RRR, no murmurs, rubs, gallops RESPIRATORY:  Clear to auscultation without rales, wheezing or rhonchi  ABDOMEN: Soft, non-tender, non-distended MUSCULOSKELETAL:  1+ rudy edema to his knees, No deformity  SKIN: Warm and dry NEUROLOGIC:  Alert and oriented x 3 PSYCHIATRIC:  Normal affect   ASSESSMENT:    Edema of both legs Possible acute diastolic CHF- Repeat echo is ordered. I suggested he increase his Lasix to 80 mg BID and stop his Amlodipine (he has had swelling on this in the past).  I added a BNP to his labs tomorrow.   CAD (coronary artery disease) 60% Dx1 by cath April 2018- no other CAD  COPD, group D, by GOLD 2017 classification ([place]) Followed by [facility]  Sleep apnea On C-pap and reports complaince  HTN (hypertension) Fair control  NICM (nonischemic cardiomyopathy) ([place]) Nonischemic cardiomyopathy-EF 40-45% Jan 2020- repeat echo ordered  PLAN:    Increase Lasix to 80 mg BID- check BMP and BNP tomorrow.  I'll f/u his labs and adjust diuretics as indicated. Stop Amlodipine. He has a f/u with Dr Gwenlyn Found later this month.   Medication Adjustments/Labs and Tests Ordered: Current medicines are reviewed at length with the patient today.  Concerns regarding medicines are outlined above.  Orders Placed This Encounter  Procedures  . Pro b natriuretic peptide (BNP)[facility])   Meds ordered this encounter  Medications  . furosemide (LASIX) 80 MG tablet    Sig: Take 1 tablet (80 mg total) by mouth 2 (two) times daily.    Dispense:  180 tablet    Refill:  1    Patient Instructions  Medication Instructions:  INCREASE Lasix to 80mg  take 1 tablet twice a day STOP Amlodipine  *If you need a refill on your cardiac medications before your next appointment, please call your pharmacy*  Lab Work: Your physician recommends that you return for lab work in: tomorrow BNP If you have labs (blood work) drawn today and your tests are completely normal,  you will receive your results only by: Marland Kitchen MyChart Message (if you have MyChart) OR . A paper copy in the mail If you have any lab test that is abnormal or we need to change your treatment, we will call you to review the results.  Testing/Procedures: None   Follow-Up: At [facility], you and your health needs are our priority.  As part of our continuing mission to provide you with exceptional heart care, we have created designated Provider Care Teams.  These Care Teams include your primary Cardiologist (physician) and Advanced Practice Providers (APPs -  Physician Assistants and Nurse Practitioners) who all work together to provide you with the care you need, when you need it.  Your next appointment:   FOLLOW UP AS SCHEDULED   The format for your next appointment:   In Person  Provider:   Quay Burow, MD  Other Instructions     Signed, Kerin Ransom, PA-C  11/22/2019 4:32 PM    [facility]

## 2019-11-22 NOTE — Assessment & Plan Note (Signed)
On C-pap and reports complaince

## 2019-11-22 NOTE — Patient Instructions (Signed)
Medication Instructions:  INCREASE Lasix to 80mg  take 1 tablet twice a day STOP Amlodipine  *If you need a refill on your cardiac medications before your next appointment, please call your pharmacy*  Lab Work: Your physician recommends that you return for lab work in: tomorrow BNP If you have labs (blood work) drawn today and your tests are completely normal, you will receive your results only by: Marland Kitchen MyChart Message (if you have MyChart) OR . A paper copy in the mail If you have any lab test that is abnormal or we need to change your treatment, we will call you to review the results.  Testing/Procedures: None   Follow-Up: At [facility], you and your health needs are our priority.  As part of our continuing mission to provide you with exceptional heart care, we have created designated Provider Care Teams.  These Care Teams include your primary Cardiologist (physician) and Advanced Practice Providers (APPs -  Physician Assistants and Nurse Practitioners) who all work together to provide you with the care you need, when you need it.  Your next appointment:   FOLLOW UP AS SCHEDULED   The format for your next appointment:   In Person  Provider:   Quay Burow, MD  Other Instructions

## 2019-11-22 NOTE — Assessment & Plan Note (Signed)
Possible acute diastolic CHF- Repeat echo is ordered. I suggested he increase his Lasix to 80 mg BID and stop his Amlodipine (he has had swelling on this in the past).  I added a BNP to his labs tomorrow.

## 2019-11-24 ENCOUNTER — Ambulatory Visit: Payer: BC Managed Care – PPO | Admitting: Cardiovascular Disease

## 2019-11-29 ENCOUNTER — Other Ambulatory Visit: Payer: Self-pay

## 2019-11-29 ENCOUNTER — Ambulatory Visit (HOSPITAL_COMMUNITY): Payer: BC Managed Care – PPO | Attending: Internal Medicine

## 2019-11-29 DIAGNOSIS — I10 Essential (primary) hypertension: Secondary | ICD-10-CM | POA: Diagnosis present

## 2019-11-29 DIAGNOSIS — I428 Other cardiomyopathies: Secondary | ICD-10-CM

## 2019-11-30 ENCOUNTER — Other Ambulatory Visit (HOSPITAL_COMMUNITY): Payer: BC Managed Care – PPO

## 2019-12-07 LAB — PRO B NATRIURETIC PEPTIDE: NT-Pro BNP: 448 pg/mL — ABNORMAL HIGH (ref 0–210)

## 2019-12-08 ENCOUNTER — Other Ambulatory Visit: Payer: Self-pay

## 2019-12-08 ENCOUNTER — Encounter: Payer: Self-pay | Admitting: Cardiovascular Disease

## 2019-12-08 ENCOUNTER — Ambulatory Visit (INDEPENDENT_AMBULATORY_CARE_PROVIDER_SITE_OTHER): Payer: BC Managed Care – PPO | Admitting: Cardiovascular Disease

## 2019-12-08 DIAGNOSIS — I10 Essential (primary) hypertension: Secondary | ICD-10-CM

## 2019-12-08 DIAGNOSIS — R600 Localized edema: Secondary | ICD-10-CM

## 2019-12-08 DIAGNOSIS — F17210 Nicotine dependence, cigarettes, uncomplicated: Secondary | ICD-10-CM | POA: Diagnosis not present

## 2019-12-08 DIAGNOSIS — I428 Other cardiomyopathies: Secondary | ICD-10-CM | POA: Diagnosis not present

## 2019-12-08 MED ORDER — BISOPROLOL FUMARATE 5 MG PO TABS: 2.5000 mg | ORAL_TABLET | Freq: Every day | ORAL | 0 refills | Status: DC

## 2019-12-08 NOTE — Assessment & Plan Note (Signed)
History of nonischemic cardiomyopathy with an EF by recent 2D echo 11/15/2019 45% with elevated LVEDP and mild LVH.  He is on losartan.  He initially was on carvedilol and Dr. Haroldine Laws switched him to [facility] which for some reason he is no longer on.  When I saw him in the office 3 weeks ago he was complaining of increased weight gain and lower extreme edema.  Put him on 40 mg of Lasix twice daily which did not improve his weight or edema.  He saw Kerin Ransom in the office 12/2 who increased his Lasix to 80 mg p.o. twice daily.  He says his edema is somewhat improved.  He does eat sausage every morning.  I told him to continue Lasix 80 mg p.o. twice daily for 1 more week and then go back to 40 mg p.o. twice daily.  We will start him on low-dose Zebeta and will have him see Cyril Mourning in the office in 2 weeks for titration, Dr. Haroldine Laws on back in several weeks for reevaluation.

## 2019-12-08 NOTE — Patient Instructions (Signed)
Medication Instructions:   START BISOPROLOL (ZEBETA) 2.5 MG DAILY *If you need a refill on your cardiac medications before your next appointment, please call your pharmacy*  Lab Work: NONE ordered at this time of appointment   If you have labs (blood work) drawn today and your tests are completely normal, you will receive your results only by: Marland Kitchen MyChart Message (if you have MyChart) OR . A paper copy in the mail If you have any lab test that is abnormal or we need to change your treatment, we will call you to review the results.  Testing/Procedures: NONE ordered at this time of appointment   Follow-Up: At [facility], you and your health needs are our priority.  As part of our continuing mission to provide you with exceptional heart care, we have created designated Provider Care Teams.  These Care Teams include your primary Cardiologist (physician) and Advanced Practice Providers (APPs -  Physician Assistants and Nurse Practitioners) who all work together to provide you with the care you need, when you need it.  Your next appointment:   2 WEEKS WITH PHARMACY 3 week(s) WITH Kerin Ransom, PA-C 3 MONTHS  WITH Quay Burow, MD 4 WEEKS WITH Glori Bickers, MD The format for your next appointment:   In Person  Provider:    Other Instructions

## 2019-12-08 NOTE — Assessment & Plan Note (Signed)
On high-dose diuretics.  He does admit to dietary indiscretion.

## 2019-12-08 NOTE — Progress Notes (Signed)
12/08/2019 Robert Church   [birth year]  [id number]  Primary Physician Hamrick, Lorin Mercy, MD Primary Cardiologist: [name] MD Garret Reddish, [place], [place]  HPI:  Robert Church is a 63 y.o.  severely overweight divorced Caucasian male father of 1 grandchild who works in [place] [employer]. He was referred because of chest pain by Dr. Lisbeth Ply forcardiovascularevaluation.I last saw him in the  11/15/2019.His mother is at [employer] my long-term patients as well. He saw Dr. Johnsie Cancel in the past. His risk factors include ongoing tobacco abuse, hypertension and family history.Henever had a heart attack or stroke. He did have a heart catheterization performed by Dr. Tamala Julian on 04/19/2017 that showed normal coronary arteries with an EF of 40% consistent with a nonischemic cardia myopathy.A 2D echo performed 07/07/2018 showed an EF of 30 to 35%.  I referred him to Dr. Haroldine Laws in the advanced heart failure clinic who changed his carvedilol to Zebeta and his losartan to [facility]. Unfortunately, he did not tolerate the Entresto and he was changed back to losartan.Hedoes have obstructive sleep apnea on CPAP which he wears intermittently. He continues to exhibit class II/III symptoms.  Since I saw him  2 months ago I did begin him on Lasix 40 mg p.o. twice daily without clinical benefit and several weeks later he saw Kerin Ransom in the office to up that 80 mg p.o. twice daily with some mild improvement in his edema although his weight has stayed stable.  Does admit to dietary indiscretion.  Recent 2D echo performed 11/15/2019 revealed an EF of 45% with mild LVH and elevated end-diastolic pressure.    Current Meds  Medication Sig  . albuterol (PROVENTIL HFA;VENTOLIN HFA) 108 (90 BASE) MCG/ACT inhaler Inhale 2 puffs into the lungs every 6 (six) hours as needed for wheezing.  Marland Kitchen albuterol (PROVENTIL) (2.5 MG/3ML) 0.083% nebulizer solution Take 3 mLs (2.5 mg total) by  nebulization every 6 (six) hours as needed for wheezing or shortness of breath.  . ALPRAZolam (XANAX) 1 MG tablet Take 0.5-2 mg by mouth See admin instructions. 0.5 mg in the morning and afternoon and take 2mg  at bedtime  . aspirin EC 81 MG tablet Take 1 tablet (81 mg total) by mouth daily.  . Aspirin-Salicylamide-Caffeine (BC FAST PAIN RELIEF) 650-195-33.3 MG PACK Take 1 packet by mouth every 8 (eight) hours as needed (for knee pain.).  . Azelastine-Fluticasone 137-50 MCG/ACT SUSP 1 spray each nostril twice daily  . cholecalciferol (VITAMIN D) 1000 units tablet Take 1,000 Units by mouth daily.  . furosemide (LASIX) 80 MG tablet Take 1 tablet (80 mg total) by mouth 2 (two) times daily.  Marland Kitchen losartan (COZAAR) 100 MG tablet Take 100 mg by mouth daily.  . mometasone-formoterol (DULERA) 200-5 MCG/ACT AERO Take 2 puffs first thing in am and then another 2 puffs about 12 hours later.  Marland Kitchen omeprazole (PRILOSEC) 20 MG capsule Take 20 mg by mouth 2 (two) times daily. ACID REFLUX  . predniSONE (DELTASONE) 1 MG tablet Take 3 tablets (3 mg total) by mouth daily with breakfast.  . spironolactone (ALDACTONE) 50 MG tablet Take 1 tablet (50 mg total) by mouth at bedtime.  . Testosterone 30 MG/ACT SOLN Apply 1 application topically daily. Apply under each arm daily  . Tiotropium Bromide Monohydrate (SPIRIVA RESPIMAT) 2.5 MCG/ACT AERS Inhale 2.5 mcg into the lungs 2 (two) times daily.     Allergies  Allergen Reactions  . Clindamycin Other (See Comments)    Tongue swelling  .  E-Mycin [Erythromycin Base] Swelling  . Penicillins Swelling and Other (See Comments)    Childhood rxn--MD stated he "almost died" Has patient had a PCN reaction causing immediate rash, facial/tongue/throat swelling, SOB or lightheadedness with hypotension:Yes Has patient had a PCN reaction causing severe rash involving mucus membranes or skin necrosis:unsure Has patient had a PCN reaction that required hospitalization:unsure Has patient had  a PCN reaction occurring within the last 10 years:No If all of the above answers are "NO", then may proceed with Cephalosporin use.    . Rosuvastatin     Other reaction(s): Myalgias (intolerance)    Social History   Socioeconomic History  . Marital status: Single    Spouse name: Not on file  . Number of children: Not on file  . Years of education: Not on file  . Highest education level: Not on file  Occupational History  . Not on file  Tobacco Use  . Smoking status: Current Every Day Smoker    Packs/day: 0.50    Years: 46.00    Pack years: 23.00    Types: Cigarettes  . Smokeless tobacco: Never Used  Substance and Sexual Activity  . Alcohol use: No    Alcohol/week: 0.0 standard drinks  . Drug use: No  . Sexual activity: Yes    Birth control/protection: None  Other Topics Concern  . Not on file  Social History Narrative  . Not on file   Social Determinants of Health   Financial Resource Strain:   . Difficulty of Paying Living Expenses: Not on file  Food Insecurity:   . Worried About Charity fundraiser in the Last Year: Not on file  . Ran Out of Food in the Last Year: Not on file  Transportation Needs:   . Lack of Transportation (Medical): Not on file  . Lack of Transportation (Non-Medical): Not on file  Physical Activity:   . Days of Exercise per Week: Not on file  . Minutes of Exercise per Session: Not on file  Stress:   . Feeling of Stress : Not on file  Social Connections:   . Frequency of Communication with Friends and Family: Not on file  . Frequency of Social Gatherings with Friends and Family: Not on file  . Attends Religious Services: Not on file  . Active Member of Clubs or Organizations: Not on file  . Attends Archivist Meetings: Not on file  . Marital Status: Not on file  Intimate Partner Violence:   . Fear of Current or Ex-Partner: Not on file  . Emotionally Abused: Not on file  . Physically Abused: Not on file  . Sexually Abused:  Not on file     Review of Systems: General: negative for chills, fever, night sweats or weight changes.  Cardiovascular: negative for chest pain, dyspnea on exertion, edema, orthopnea, palpitations, paroxysmal nocturnal dyspnea or shortness of breath Dermatological: negative for rash Respiratory: negative for cough or wheezing Urologic: negative for hematuria Abdominal: negative for nausea, vomiting, diarrhea, bright red blood per rectum, melena, or hematemesis Neurologic: negative for visual changes, syncope, or dizziness All other systems reviewed and are otherwise negative except as noted above.    Blood pressure (!) 134/92, pulse 90, temperature (!) 97.3 F (36.3 C), height 5\' 10"  (1.778 m), weight 231 lb 6.4 oz (105 kg), SpO2 94 %.  General appearance: alert and no distress Neck: no adenopathy, no carotid bruit, no JVD, supple, symmetrical, trachea midline and thyroid not enlarged, symmetric, no tenderness/mass/nodules Lungs: clear  to auscultation bilaterally Heart: regular rate and rhythm, S1, S2 normal, no murmur, click, rub or gallop Extremities: 1-2+ pitting edema Pulses: 2+ and symmetric Skin: Venous stasis changes Neurologic: Alert and oriented X 3, normal strength and tone. Normal symmetric reflexes. Normal coordination and gait  EKG not performed today  ASSESSMENT AND PLAN:   HTN (hypertension) History of essential hypertension on losartan.  He was on amlodipine which was recently discontinued probably because of lower extremity edema.  His blood pressure today is 134/92.  Cigarette smoker Patient stop smoking 3 to 4 weeks ago  NICM (nonischemic cardiomyopathy) ([place]) History of nonischemic cardiomyopathy with an EF by recent 2D echo 11/15/2019 45% with elevated LVEDP and mild LVH.  He is on losartan.  He initially was on carvedilol and Dr. Haroldine Laws switched him to [facility] which for some reason he is no longer on.  When I saw him in the office 3 weeks ago he was  complaining of increased weight gain and lower extreme edema.  Put him on 40 mg of Lasix twice daily which did not improve his weight or edema.  He saw Kerin Ransom in the office 12/2 who increased his Lasix to 80 mg p.o. twice daily.  He says his edema is somewhat improved.  He does eat sausage every morning.  I told him to continue Lasix 80 mg p.o. twice daily for 1 more week and then go back to 40 mg p.o. twice daily.  We will start him on low-dose Zebeta and will have him see Cyril Mourning in the office in 2 weeks for titration, Dr. Haroldine Laws on back in several weeks for reevaluation.  Edema of both legs On high-dose diuretics.  He does admit to dietary indiscretion.      [name] MD FACP,FACC,FAHA, [facility] 12/08/2019 3:20 PM

## 2019-12-08 NOTE — Assessment & Plan Note (Signed)
Patient stop smoking 3 to 4 weeks ago

## 2019-12-08 NOTE — Assessment & Plan Note (Signed)
History of essential hypertension on losartan.  He was on amlodipine which was recently discontinued probably because of lower extremity edema.  His blood pressure today is 134/92.

## 2019-12-20 ENCOUNTER — Other Ambulatory Visit: Payer: Self-pay

## 2019-12-20 ENCOUNTER — Ambulatory Visit (INDEPENDENT_AMBULATORY_CARE_PROVIDER_SITE_OTHER): Payer: BC Managed Care – PPO | Admitting: Emergency Medicine

## 2019-12-20 ENCOUNTER — Encounter: Payer: Self-pay | Admitting: Emergency Medicine

## 2019-12-20 DIAGNOSIS — J449 Chronic obstructive pulmonary disease, unspecified: Secondary | ICD-10-CM

## 2019-12-20 NOTE — Progress Notes (Signed)
Patient her to reestablish care  Subjective:  Patient ID: Robert Church, male    DOB: 06/11/1956  Age: 63 y.o. MRN: [id number]  CC:  Chief Complaint  Patient presents with  . Follow-up    COPD    HPI  ROV 12/20/2019 --follow-up visit for Robert Church who has severe COPD with chronic bronchitis, continued tobacco abuse.  He is on chronic prednisone 3 mg daily decreased from 5mg  in November, Dulera and Spiriva.  We have been following a 9 mm right lower lobe nodule on lung cancer screening, due for his next scan 02/2020.  He also has untreated obstructive sleep apnea. He uses ventolin several times a day, sometimes wakes up at night to use. He is on mucinex. Clear mucous. He believes that his breathing has worsened. Smoking less than 10 cig a day.     ROS Review of Systems  Constitutional: Positive for activity change (Increasing heat decreases activity). Negative for fatigue, fever and unexpected weight change.  HENT: Negative for congestion, postnasal drip, rhinorrhea and sinus pain.   Eyes: Negative for redness.  Respiratory: Positive for apnea, cough, shortness of breath and wheezing. Negative for choking, chest tightness and stridor.   Cardiovascular: Positive for leg swelling. Negative for chest pain and palpitations.  Gastrointestinal: Positive for abdominal distention. Negative for abdominal pain, diarrhea and nausea.  Endocrine: Negative for polyuria.  Genitourinary: Negative for dysuria and hematuria.  Musculoskeletal: Negative for back pain, gait problem and myalgias.  Skin: Negative for rash and wound.  Neurological: Negative for dizziness, seizures, weakness and headaches.  Psychiatric/Behavioral: Negative for agitation and self-injury.    Objective:   Today's Vitals: BP 138/76 (BP Location: Right Arm, Cuff Size: Normal)   Pulse 84   Temp (!) 97.3 F (36.3 C) (Temporal)   Ht 5\' 10"  (1.778 m)   Wt 228 lb (103.4 kg)   SpO2 91%   BMI 32.71 kg/m   Gen: Pleasant,  well-nourished, in no distress,  normal affect  ENT: No lesions,  mouth clear,  oropharynx clear, no postnasal drip  Neck: No JVD, no stridor  Lungs: No use of accessory muscles, no crackles or wheezing on normal respiration, no wheeze on forced expiration  Cardiovascular: RRR, heart sounds normal, no murmur or gallops, no peripheral edema  Musculoskeletal: No deformities, no cyanosis or clubbing  Neuro: alert, awake, non focal  Skin: Warm, no lesions or rash   Assessment & Plan:   Problem List Items Addressed This Visit    COPD, group D, by GOLD 2017 classification ([place])    Worsening of his chronic symptoms, increased mucus burden.  Certainly the cigarettes are a big problem here but I suspect he is feeling the decrease in his prednisone from 5 mg to 3 mg.  I will work on increasing him back to 5 and see if he gets clinical benefit.  He will need a walking oximetry next time, is likely approaching oxygen need.  Continue his Mucinex.  Discussed smoking cessation with him today.  Increase prednisone to 10mg  daily for 10 days, then decrease to 5mg  daily and stay at this dose until follow up.  Please continue your inhaled meds as you are taking them  Work hard to decrease your cigarettes.  Follow with Robert Church in 2 months or sooner if you have any problems.          Outpatient Encounter Medications as of 12/20/2019  Medication Sig  . albuterol (PROVENTIL HFA;VENTOLIN HFA) 108 (90 BASE) MCG/ACT  inhaler Inhale 2 puffs into the lungs every 6 (six) hours as needed for wheezing.  Marland Kitchen albuterol (PROVENTIL) (2.5 MG/3ML) 0.083% nebulizer solution Take 3 mLs (2.5 mg total) by nebulization every 6 (six) hours as needed for wheezing or shortness of breath.  . ALPRAZolam (XANAX) 1 MG tablet Take 0.5-2 mg by mouth See admin instructions. 0.5 mg in the morning and afternoon and take 2mg  at bedtime  . aspirin EC 81 MG tablet Take 1 tablet (81 mg total) by mouth daily.  .  Aspirin-Salicylamide-Caffeine (BC FAST PAIN RELIEF) 650-195-33.3 MG PACK Take 1 packet by mouth every 8 (eight) hours as needed (for knee pain.).  . Azelastine-Fluticasone 137-50 MCG/ACT SUSP 1 spray each nostril twice daily  . bisoprolol (ZEBETA) 5 MG tablet Take 0.5 tablets (2.5 mg total) by mouth daily.  . cholecalciferol (VITAMIN D) 1000 units tablet Take 1,000 Units by mouth daily.  . furosemide (LASIX) 80 MG tablet Take 1 tablet (80 mg total) by mouth 2 (two) times daily.  Marland Kitchen losartan (COZAAR) 100 MG tablet Take 100 mg by mouth daily.  . mometasone-formoterol (DULERA) 200-5 MCG/ACT AERO Take 2 puffs first thing in am and then another 2 puffs about 12 hours later.  Marland Kitchen omeprazole (PRILOSEC) 20 MG capsule Take 20 mg by mouth 2 (two) times daily. ACID REFLUX  . predniSONE (DELTASONE) 1 MG tablet Take 3 tablets (3 mg total) by mouth daily with breakfast.  . spironolactone (ALDACTONE) 50 MG tablet Take 1 tablet (50 mg total) by mouth at bedtime.  . Testosterone 30 MG/ACT SOLN Apply 1 application topically daily. Apply under each arm daily  . Tiotropium Bromide Monohydrate (SPIRIVA RESPIMAT) 2.5 MCG/ACT AERS Inhale 2.5 mcg into the lungs 2 (two) times daily.   No facility-administered encounter medications on file as of 12/20/2019.    Follow-up: No follow-ups on file.   COPD, group D, by GOLD 2017 classification ([place]) Worsening of his chronic symptoms, increased mucus burden.  Certainly the cigarettes are a big problem here but I suspect he is feeling the decrease in his prednisone from 5 mg to 3 mg.  I will work on increasing him back to 5 and see if he gets clinical benefit.  He will need a walking oximetry next time, is likely approaching oxygen need.  Continue his Mucinex.  Discussed smoking cessation with him today.  Increase prednisone to 10mg  daily for 10 days, then decrease to 5mg  daily and stay at this dose until follow up.  Please continue your inhaled meds as you are taking them  Work  hard to decrease your cigarettes.  Follow with Robert Church in 2 months or sooner if you have any problems.    Baltazar Apo, MD, PhD 12/20/2019, 3:20 PM [facility] [phone] or if no answer [phone]

## 2019-12-20 NOTE — Assessment & Plan Note (Signed)
Worsening of his chronic symptoms, increased mucus burden.  Certainly the cigarettes are a big problem here but I suspect he is feeling the decrease in his prednisone from 5 mg to 3 mg.  I will work on increasing him back to 5 and see if he gets clinical benefit.  He will need a walking oximetry next time, is likely approaching oxygen need.  Continue his Mucinex.  Discussed smoking cessation with him today.  Increase prednisone to 10mg  daily for 10 days, then decrease to 5mg  daily and stay at this dose until follow up.  Please continue your inhaled meds as you are taking them  Work hard to decrease your cigarettes.  Follow with [name] in 2 months or sooner if you have any problems.

## 2019-12-20 NOTE — Patient Instructions (Addendum)
Increase prednisone to 10mg  daily for 10 days, then decrease to 5mg  daily and stay at this dose until follow up.  Please continue your inhaled meds as you are taking them  Work hard to decrease your cigarettes.  Follow with [name] in 2 months or sooner if you have any problems.

## 2019-12-25 ENCOUNTER — Telehealth: Payer: Self-pay

## 2019-12-25 NOTE — Telephone Encounter (Signed)
Left message on patients home number advising him to contact office to reschedule upcoming appointment because Lurena Joiner will not be in the office in the afternoon.  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

## 2019-12-27 ENCOUNTER — Ambulatory Visit: Payer: BC Managed Care – PPO | Admitting: Pharmacist

## 2019-12-27 ENCOUNTER — Telehealth: Payer: Self-pay

## 2019-12-27 NOTE — Telephone Encounter (Signed)
Called the patient at the request of the provider to get patient reschedule to a different day and/or cancel appointment. Left voice message informing patient that his upcoming appointment would need to be rescheduled to a later date and/or cancelled for he an upcoming appointment on 01/10/20 with Dr. Danelle Earthly that he should keep and if he has any new or worsen symptoms to give our office a call and someone will be glad to assist him.

## 2019-12-27 NOTE — Progress Notes (Unsigned)
Patient ID: [name]                 DOB: [birth year]                      MRN: [id number]     HPI: Robert Church is a 64 y.o. male referred by Dr. Quay Church to HTN/HF clinic. PMH is significant for HTN, CAD, nonischemic cardiomyopathy (NICM), asthma, OSA on CPAP, COPD Group D, GERD, and morbid obesity. Pt was referred to cardiology due to reports of chest pain. Heart catheterization 04/19/2017 showed normal coronary arteries with EF of 40% consistent with nonischemic cardiac myopathy. 2D ECHO on 07/07/2018 showed EF of 30-35%.   Dr. Lisbeth Church referred patient to Dr. Hortense Church in the advanced heart failure clinic. Carvedilol changed to bisoprolol and losartan changed to Entresto. Patient could not tolerate Entresto 97/103 mg, so switched back to losartan 100mg . 2D ECHO on 11/15/2019 showing EF 45% with mild LVH and elevated end-diastolic pressure.  Patient wears CPAP intermittently and continues to exhibit class II/III symptoms.   Patient was last seen on 12/08/2019 by Dr. Roderic Church. BP of 134/92 and HR 90. Patient restarted on bisoprolol 2.5 mg. Patient reported weight gain and edema. Instructed to take furosemide 80 mg BID for 1 week, then go back to furosemide 40 mg BID. Patient presents today for follow-up.  Current HTN/HF meds: bisoprolol 5 mg (1/2 tab) QD, furosemide 80 mg BID, losartan 100 mg QD, spironolactone 50 mg QHS  Previously tried: amlodipine 10 mg (lower extremity edema), carvedilol 3.125 mg (GI upset and severe fatigue), Entresto 97/103 mg (not tolerated)  What happened with Entresto???? Are you doing ok on 1/2 tab of bisoprolol? Update med list to see if patient went back to 40 mg of furosemide. Any more fam hx?? Still smoking??  BP goal: <130/80 mmHg  Family History: Brain and lung cancer in father  Social History: Stopped cigarette smoking 1/2 ppd on 10/2019; no record of alcohol or drug use  Diet: dietary indescretion  Exercise:   Home BP readings:   Wt Readings  from Last 3 Encounters:  12/20/19 228 lb (103.4 kg)  12/08/19 231 lb 6.4 oz (105 kg)  11/22/19 230 lb (104.3 kg)   BP Readings from Last 3 Encounters:  12/20/19 138/76  12/08/19 (!) 134/92  11/22/19 133/82   Pulse Readings from Last 3 Encounters:  12/20/19 84  12/08/19 90  11/22/19 88    Renal function: CrCl cannot be calculated (Patient's most recent lab result is older than the maximum 21 days allowed.).  Past Medical History:  Diagnosis Date  . Allergy   . Asthma   . Bronchitis   . COPD (chronic obstructive pulmonary disease) ([place])   . GERD (gastroesophageal reflux disease)   . Hypertension   . Pneumonia   . Sinusitis   . SOB (shortness of breath)     Current Outpatient Medications on File Prior to Visit  Medication Sig Dispense Refill  . albuterol (PROVENTIL HFA;VENTOLIN HFA) 108 (90 BASE) MCG/ACT inhaler Inhale 2 puffs into the lungs every 6 (six) hours as needed for wheezing. 1 Inhaler 11  . albuterol (PROVENTIL) (2.5 MG/3ML) 0.083% nebulizer solution Take 3 mLs (2.5 mg total) by nebulization every 6 (six) hours as needed for wheezing or shortness of breath. 75 mL 12  . ALPRAZolam (XANAX) 1 MG tablet Take 0.5-2 mg by mouth See admin instructions. 0.5 mg in the morning and afternoon and take 2mg  at bedtime    .  aspirin EC 81 MG tablet Take 1 tablet (81 mg total) by mouth daily. 90 tablet 3  . Aspirin-Salicylamide-Caffeine (BC FAST PAIN RELIEF) 650-195-33.3 MG PACK Take 1 packet by mouth every 8 (eight) hours as needed (for knee pain.).    . Azelastine-Fluticasone 137-50 MCG/ACT SUSP 1 spray each nostril twice daily 23 g 6  . bisoprolol (ZEBETA) 5 MG tablet Take 0.5 tablets (2.5 mg total) by mouth daily. 90 tablet 0  . cholecalciferol (VITAMIN D) 1000 units tablet Take 1,000 Units by mouth daily.    . furosemide (LASIX) 80 MG tablet Take 1 tablet (80 mg total) by mouth 2 (two) times daily. 180 tablet 1  . losartan (COZAAR) 100 MG tablet Take 100 mg by mouth daily.    .  mometasone-formoterol (DULERA) 200-5 MCG/ACT AERO Take 2 puffs first thing in am and then another 2 puffs about 12 hours later. 1 Inhaler 11  . omeprazole (PRILOSEC) 20 MG capsule Take 20 mg by mouth 2 (two) times daily. ACID REFLUX    . predniSONE (DELTASONE) 1 MG tablet Take 3 tablets (3 mg total) by mouth daily with breakfast. 90 tablet 2  . spironolactone (ALDACTONE) 50 MG tablet Take 1 tablet (50 mg total) by mouth at bedtime. 30 tablet 6  . Testosterone 30 MG/ACT SOLN Apply 1 application topically daily. Apply under each arm daily    . Tiotropium Bromide Monohydrate (SPIRIVA RESPIMAT) 2.5 MCG/ACT AERS Inhale 2.5 mcg into the lungs 2 (two) times daily. 4 g 3   No current facility-administered medications on file prior to visit.    Allergies  Allergen Reactions  . Clindamycin Other (See Comments)    Tongue swelling  . E-Mycin [Erythromycin Base] Swelling  . Penicillins Swelling and Other (See Comments)    Childhood rxn--MD stated he "almost died" Has patient had a PCN reaction causing immediate rash, facial/tongue/throat swelling, SOB or lightheadedness with hypotension:Yes Has patient had a PCN reaction causing severe rash involving mucus membranes or skin necrosis:unsure Has patient had a PCN reaction that required hospitalization:unsure Has patient had a PCN reaction occurring within the last 10 years:No If all of the above answers are "NO", then may proceed with Cephalosporin use.    . Rosuvastatin     Other reaction(s): Myalgias (intolerance)     Assessment/Plan:  1. Hypertension - Increase bisoprolol dose to 5 mg daily.

## 2019-12-28 ENCOUNTER — Ambulatory Visit: Payer: BC Managed Care – PPO | Admitting: Cardiology

## 2020-01-02 ENCOUNTER — Telehealth: Payer: Self-pay | Admitting: Emergency Medicine

## 2020-01-02 NOTE — Telephone Encounter (Signed)
Spoke with pt, he is requesting cough medication be sent into the pharmacy. He is reporting congestion for 3 weeks.and cough. He coughs so bad that he is unable to sleep. He mentioned this to RB when he saw him in December but it has not stopped since then. The mucus is sometimes clear but sometimes yellow. He is on Mucinex but has not tried any OTC cough medications. He denies fever, sore throat, or body aches. He states his nose been running then stopped up or it will drain in the back of his throat. Please advise

## 2020-01-03 MED ORDER — HYDROCODONE-HOMATROPINE 5-1.5 MG/5ML PO SYRP: 5.0000 mL | ORAL_SOLUTION | Freq: Four times a day (QID) | ORAL | 0 refills | Status: DC | PRN

## 2020-01-03 NOTE — Telephone Encounter (Signed)
Called and spoke with pt stating to him the info provided by RB that hycodan was sent to pharmacy for him, to make sure he is taking either astelin/fluticasone nasal spray, and to also start taking either loratadine/cetirizine daily. Also stated to pt if still having problems to call office next week so we could get him scheduled for an appt.  Pt verbalized understanding. Nothing further needed.

## 2020-01-03 NOTE — Telephone Encounter (Signed)
Please make sure he is taking his astelin / fluticasone NS twice a day Have him start loratadine or cetirizine 10mg  daily I will write a script for cough syrup to use short term If his cough continues probably need to see him, decide whether he needs to be treated for a flare of his COPD

## 2020-01-03 NOTE — Telephone Encounter (Signed)
Script for [employer] sent to [facility]

## 2020-01-09 ENCOUNTER — Telehealth: Payer: Self-pay

## 2020-01-09 ENCOUNTER — Telehealth (INDEPENDENT_AMBULATORY_CARE_PROVIDER_SITE_OTHER): Payer: Self-pay | Admitting: Cardiology

## 2020-01-09 ENCOUNTER — Encounter: Payer: Self-pay | Admitting: Cardiology

## 2020-01-09 VITALS — Ht 70.0 in | Wt 227.0 lb

## 2020-01-09 DIAGNOSIS — G4733 Obstructive sleep apnea (adult) (pediatric): Secondary | ICD-10-CM

## 2020-01-09 DIAGNOSIS — I428 Other cardiomyopathies: Secondary | ICD-10-CM

## 2020-01-09 DIAGNOSIS — I5042 Chronic combined systolic (congestive) and diastolic (congestive) heart failure: Secondary | ICD-10-CM | POA: Insufficient documentation

## 2020-01-09 DIAGNOSIS — J449 Chronic obstructive pulmonary disease, unspecified: Secondary | ICD-10-CM

## 2020-01-09 DIAGNOSIS — I2510 Atherosclerotic heart disease of native coronary artery without angina pectoris: Secondary | ICD-10-CM

## 2020-01-09 NOTE — Progress Notes (Signed)
Virtual Visit via Telephone Note   This visit type was conducted due to national recommendations for restrictions regarding the COVID-19 Pandemic (e.g. social distancing) in an effort to limit this patient's exposure and mitigate transmission in our community.  Due to his co-morbid illnesses, this patient is at least at moderate risk for complications without adequate follow up.  This format is felt to be most appropriate for this patient at this time.  The patient did not have access to video technology/had technical difficulties with video requiring transitioning to audio format only (telephone).  All issues noted in this document were discussed and addressed.  No physical exam could be performed with this format.  Please refer to the patient's chart for his  consent to telehealth for [facility].   Date:  01/09/2020   ID:  Robert Church, DOB Apr 30, 1956, MRN [id number]  Patient Location: Home Provider Location: Home  PCP:  Geannie Risen, MD  Cardiologist:  Quay Burow, MD  Electrophysiologist:  None   Evaluation Performed:  Follow-Up Visit  Chief Complaint:  fatigue  History of Present Illness:    Robert Church is a 64 y.o. male with history of nonischemic cardiomyopathy and COPD.  He had a catheterization in April 2018 that showed normal coronaries.  Coronary CT with FFR in 2019 again showed no significant coronary disease.  Echo in July 2019 showed an EF of 30 to 35%.  Follow-up echo in January 2020 showed his EF had improved to 40 to 45%, this was repeated and unchanged in December 2020 when he presented with lower extremity edema.    The patient also has sleep apnea followed by [name].  He is unable to be totally compliant with his CPAP.  He says he puts it on at night and then wakes up coughing and has to take it off.  He goes back to sleep and then wakes up again and this pattern repeats itself through the night.  Overall he is very fatigued.  He says he falls asleep  during the day.  He did have some evidence of pulmonary hypertension on his coronary CT in 2019. He did tell me his edema improved on the increased Lasix.  The patient was contacted today for follow-up from his December 2020 office visit with complaints of lower extremity edema.  He was taken off amlodipine and his Lasix was increased.  His BNP was 448.  Dr. Gwenlyn Found referred him to Dr. Haroldine Laws who had previously seen the patient for CHF in Oct 20129. The patient has an appointment tomorrow to see Dr. Haroldine Laws, he was supposed to see me a week ago but his appointment was rescheduled for today.   The patient does not have symptoms concerning for COVID-19 infection (fever, chills, cough, or new shortness of breath).    Past Medical History:  Diagnosis Date  . Allergy   . Asthma   . Bronchitis   . COPD (chronic obstructive pulmonary disease) ([place])   . GERD (gastroesophageal reflux disease)   . Hypertension   . Pneumonia   . Sinusitis   . SOB (shortness of breath)    Past Surgical History:  Procedure Laterality Date  . LEFT HEART CATH AND CORONARY ANGIOGRAPHY N/A 04/19/2017   Procedure: Left Heart Cath and Coronary Angiography;  Surgeon: Belva Crome, MD;  Location: [place] [facility];  Service: Cardiovascular;  Laterality: N/A;  . TONSILLECTOMY       Current Meds  Medication Sig  . albuterol (PROVENTIL  HFA;VENTOLIN HFA) 108 (90 BASE) MCG/ACT inhaler Inhale 2 puffs into the lungs every 6 (six) hours as needed for wheezing.  Marland Kitchen albuterol (PROVENTIL) (2.5 MG/3ML) 0.083% nebulizer solution Take 3 mLs (2.5 mg total) by nebulization every 6 (six) hours as needed for wheezing or shortness of breath.  . ALPRAZolam (XANAX) 1 MG tablet Take 0.5-2 mg by mouth See admin instructions. 0.5 mg in the morning and afternoon and take 2mg  at bedtime  . Aspirin-Salicylamide-Caffeine (BC FAST PAIN RELIEF) 650-195-33.3 MG PACK Take 1 packet by mouth every 8 (eight) hours as needed (for knee pain.).  .  Azelastine-Fluticasone 137-50 MCG/ACT SUSP 1 spray each nostril twice daily  . bisoprolol (ZEBETA) 5 MG tablet Take 0.5 tablets (2.5 mg total) by mouth daily.  . cholecalciferol (VITAMIN D) 1000 units tablet Take 1,000 Units by mouth daily.  . furosemide (LASIX) 80 MG tablet Take 1 tablet (80 mg total) by mouth 2 (two) times daily.  Marland Kitchen HYDROcodone-homatropine (HYCODAN) 5-1.5 MG/5ML syrup Take 5 mLs by mouth every 6 (six) hours as needed for cough.  . losartan (COZAAR) 100 MG tablet Take 100 mg by mouth daily.  . mometasone-formoterol (DULERA) 200-5 MCG/ACT AERO Take 2 puffs first thing in am and then another 2 puffs about 12 hours later.  Marland Kitchen omeprazole (PRILOSEC) 20 MG capsule Take 20 mg by mouth 2 (two) times daily. ACID REFLUX  . predniSONE (DELTASONE) 1 MG tablet Take 3 tablets (3 mg total) by mouth daily with breakfast.  . spironolactone (ALDACTONE) 50 MG tablet Take 1 tablet (50 mg total) by mouth at bedtime.  . Testosterone 30 MG/ACT SOLN Apply 1 application topically daily. Apply under each arm daily  . Tiotropium Bromide Monohydrate (SPIRIVA RESPIMAT) 2.5 MCG/ACT AERS Inhale 2.5 mcg into the lungs 2 (two) times daily.     Allergies:   Clindamycin, E-mycin [erythromycin base], Penicillins, and Rosuvastatin   Social History   Tobacco Use  . Smoking status: Current Every Day Smoker    Packs/day: 0.50    Years: 46.00    Pack years: 23.00    Types: Cigarettes  . Smokeless tobacco: Never Used  Substance Use Topics  . Alcohol use: No    Alcohol/week: 0.0 standard drinks  . Drug use: No     Family Hx: The patient's family history includes Brain cancer in his father; Lung cancer in his father.  ROS:   Please see the history of present illness.    All other systems reviewed and are negative.   Prior CV studies:   The following studies were reviewed today: Echo Dec 2020 Cath 2018 Coronary CTA 2019  Labs/Other Tests and Data Reviewed:    EKG:  An ECG dated 11/20/2019 was  personally reviewed today and demonstrated:  NSR 97, IVCD, Q in V2  Recent Labs: 02/15/2019: BUN 15; Creatinine, Ser 0.96; Potassium 4.0; Sodium 139 12/06/2019: NT-Pro BNP 448   Recent Lipid Panel Lab Results  Component Value Date/Time   CHOL 197 05/12/2016 09:05 AM   TRIG 350 (H) 05/12/2016 09:05 AM   HDL 36 (L) 05/12/2016 09:05 AM   CHOLHDL 5.5 (H) 05/12/2016 09:05 AM   LDLCALC 91 05/12/2016 09:05 AM    Wt Readings from Last 3 Encounters:  01/09/20 227 lb (103 kg)  12/20/19 228 lb (103.4 kg)  12/08/19 231 lb 6.4 oz (105 kg)     Objective:    Vital Signs:  Ht 5\' 10"  (1.778 m)   Wt 227 lb (103 kg)   BMI 32.57  kg/m    VITAL SIGNS:  reviewed  ASSESSMENT & PLAN:    Chronic combined CHF- He has a f/u with Dr Haroldine Laws tomorrow in the office  COPD- He doesn't like taking steroids and has weaned himself down to 2.5 mg Prednisone daily.  He has a f/u with Dr Lamonte Sakai in Feb.  NICM- EF 40-45% 11/29/2019  Normal coronaries- Cath 2018 CTA 2019  Sleep apnea- Reports partial compliance  Plan: Keep f/u tomorrow- will defer labs to Dr Haroldine Laws.  I did not charge him for today's visit.   COVID-19 Education: The signs and symptoms of COVID-19 were discussed with the patient and how to seek care for testing (follow up with PCP or arrange E-visit).  The importance of social distancing was discussed today.  Time:   Today, I have spent 10 minutes with the patient with telehealth technology discussing the above problems.     Medication Adjustments/Labs and Tests Ordered: Current medicines are reviewed at length with the patient today.  Concerns regarding medicines are outlined above.   Tests Ordered: No orders of the defined types were placed in this encounter.   Medication Changes: No orders of the defined types were placed in this encounter.   Follow Up:  In Person Dr Haroldine Laws as scheduled.  Angelena Form, PA-C  01/09/2020 9:44 AM    [facility]

## 2020-01-09 NOTE — Telephone Encounter (Signed)
Robert Church spoke with patient and advised him to follow up with Dr Haroldine Laws at end of his virtual visit.

## 2020-01-09 NOTE — Patient Instructions (Addendum)
Medication Instructions:  Your physician recommends that you continue on your current medications as directed. Please refer to the Current Medication list given to you today. *If you need a refill on your cardiac medications before your next appointment, please call your pharmacy*  Lab Work: None  If you have labs (blood work) drawn today and your tests are completely normal, you will receive your results only by: Marland Kitchen MyChart Message (if you have MyChart) OR . A paper copy in the mail If you have any lab test that is abnormal or we need to change your treatment, we will call you to review the results.  Testing/Procedures: None   Follow-Up: At [facility], you and your health needs are our priority.  As part of our continuing mission to provide you with exceptional heart care, we have created designated Provider Care Teams.  These Care Teams include your primary Cardiologist (physician) and Advanced Practice Providers (APPs -  Physician Assistants and Nurse Practitioners) who all work together to provide you with the care you need, when you need it.  Your next appointment:   TOMORROW  The format for your next appointment:   In Person  Provider:   Glori Bickers  Other Instructions

## 2020-01-09 NOTE — Telephone Encounter (Signed)

## 2020-01-10 ENCOUNTER — Encounter (HOSPITAL_COMMUNITY): Payer: Self-pay | Admitting: Internal Medicine

## 2020-01-11 ENCOUNTER — Other Ambulatory Visit: Payer: Self-pay

## 2020-01-11 ENCOUNTER — Encounter (HOSPITAL_COMMUNITY): Payer: Self-pay | Admitting: Internal Medicine

## 2020-01-11 ENCOUNTER — Ambulatory Visit (HOSPITAL_COMMUNITY)
Admission: RE | Admit: 2020-01-11 | Discharge: 2020-01-11 | Disposition: A | Discharge status: Home / Self Care | Payer: BC Managed Care – PPO | Source: Ambulatory Visit | Attending: Internal Medicine | Admitting: Internal Medicine

## 2020-01-11 VITALS — BP 136/82 | HR 82 | Wt 230.8 lb

## 2020-01-11 DIAGNOSIS — I5022 Chronic systolic (congestive) heart failure: Secondary | ICD-10-CM | POA: Diagnosis not present

## 2020-01-11 DIAGNOSIS — G4733 Obstructive sleep apnea (adult) (pediatric): Secondary | ICD-10-CM

## 2020-01-11 DIAGNOSIS — I428 Other cardiomyopathies: Secondary | ICD-10-CM | POA: Diagnosis not present

## 2020-01-11 DIAGNOSIS — Z808 Family history of malignant neoplasm of other organs or systems: Secondary | ICD-10-CM | POA: Diagnosis not present

## 2020-01-11 DIAGNOSIS — Z7952 Long term (current) use of systemic steroids: Secondary | ICD-10-CM | POA: Insufficient documentation

## 2020-01-11 DIAGNOSIS — I453 Trifascicular block: Secondary | ICD-10-CM | POA: Diagnosis not present

## 2020-01-11 DIAGNOSIS — Z881 Allergy status to other antibiotic agents status: Secondary | ICD-10-CM | POA: Diagnosis not present

## 2020-01-11 DIAGNOSIS — R600 Localized edema: Secondary | ICD-10-CM

## 2020-01-11 DIAGNOSIS — Z880 Allergy status to penicillin: Secondary | ICD-10-CM | POA: Diagnosis not present

## 2020-01-11 DIAGNOSIS — Z888 Allergy status to other drugs, medicaments and biological substances status: Secondary | ICD-10-CM | POA: Diagnosis not present

## 2020-01-11 DIAGNOSIS — J449 Chronic obstructive pulmonary disease, unspecified: Secondary | ICD-10-CM | POA: Insufficient documentation

## 2020-01-11 DIAGNOSIS — K219 Gastro-esophageal reflux disease without esophagitis: Secondary | ICD-10-CM | POA: Diagnosis not present

## 2020-01-11 DIAGNOSIS — Z801 Family history of malignant neoplasm of trachea, bronchus and lung: Secondary | ICD-10-CM | POA: Diagnosis not present

## 2020-01-11 DIAGNOSIS — I110 Hypertensive heart disease with heart failure: Secondary | ICD-10-CM | POA: Insufficient documentation

## 2020-01-11 DIAGNOSIS — Z6833 Body mass index (BMI) 33.0-33.9, adult: Secondary | ICD-10-CM | POA: Diagnosis not present

## 2020-01-11 DIAGNOSIS — Z7951 Long term (current) use of inhaled steroids: Secondary | ICD-10-CM | POA: Diagnosis not present

## 2020-01-11 DIAGNOSIS — I5042 Chronic combined systolic (congestive) and diastolic (congestive) heart failure: Secondary | ICD-10-CM | POA: Diagnosis not present

## 2020-01-11 DIAGNOSIS — Z79899 Other long term (current) drug therapy: Secondary | ICD-10-CM | POA: Diagnosis not present

## 2020-01-11 DIAGNOSIS — I10 Essential (primary) hypertension: Secondary | ICD-10-CM

## 2020-01-11 LAB — BASIC METABOLIC PANEL
Anion gap: 11 (ref 5–15)
BUN: 14 mg/dL (ref 8–23)
CO2: 29 mmol/L (ref 22–32)
Calcium: 8.7 mg/dL — ABNORMAL LOW (ref 8.9–10.3)
Chloride: 101 mmol/L (ref 98–111)
Creatinine, Ser: 1.07 mg/dL (ref 0.61–1.24)
GFR calc Af Amer: >60 mL/min (ref >60)
GFR calc non Af Amer: >60 mL/min (ref >60)
Glucose, Bld: 183 mg/dL — ABNORMAL HIGH (ref 70–99)
Potassium: 3.6 mmol/L (ref 3.5–5.1)
Sodium: 141 mmol/L (ref 135–145)

## 2020-01-11 LAB — BRAIN NATRIURETIC PEPTIDE: B Natriuretic Peptide: 267.3 pg/mL — ABNORMAL HIGH (ref 0.0–100.0)

## 2020-01-11 MED ORDER — TORSEMIDE 20 MG PO TABS: 40.0000 mg | ORAL_TABLET | Freq: Every day | ORAL | 3 refills | Status: DC

## 2020-01-11 NOTE — Progress Notes (Signed)
ADVANCED HF CLINIC NOTE   HPI:  Robert Church is a 64 y/o male with h/o obesity, HTN, COPD with ongoing tobacco use and chronic systolic HF referred by Robert Church for further management of his HF.   He underwent cath in 4/18 due to CP. Cath showed normal coronaries with EF 40% and global HK. LVEDP 19. He was referred for a sleep study.   He saw Robert Church on 07/01/2018 because of recurrent chest pain.  Myoview stress test performed 07/21/2018 showed EF 38% inferior scar with septal ischemia.  A 2D echo performed 07/07/2018 revealed an EF of 30 to 35%.  He was referred for cardiac CT.   Cardiac CT on 08/26/18.  Calcium score 148 (74th percentile)  LAD 25% otherwise normal cors. Dilated pulmonary arteries suggestive of PH.   Works for [employer]. Still smoking 2-3 cigs/day. Follows with Robert Church in Pulmonary Clinic. Has been on prednisone for 4 months for COPD. Has gained 15 pounds. Says has good days and days with his breathing. Unable to tolerrate CPAP. Get SOB with minimal activity. Mild CP occasionally. Had some edema with prednisone and addition of amlodipine. Takes lasix 80 daily. AS last visit we switched to [name] but couldn't tolerate. He doesn't remember the reason   Echo 12/20 EF improved to 45%. Mild RV dysfunction   PFTs 3/18 FEV1 2.54 (72%) FVC   3.48 (75%) DLCO 107%   Past Medical History:  Diagnosis Date  . Allergy   . Asthma   . Bronchitis   . COPD (chronic obstructive pulmonary disease) ([place])   . GERD (gastroesophageal reflux disease)   . Hypertension   . Pneumonia   . Sinusitis   . SOB (shortness of breath)     Current Outpatient Medications  Medication Sig Dispense Refill  . albuterol (PROVENTIL HFA;VENTOLIN HFA) 108 (90 BASE) MCG/ACT inhaler Inhale 2 puffs into the lungs every 6 (six) hours as needed for wheezing. 1 Inhaler 11  . albuterol (PROVENTIL) (2.5 MG/3ML) 0.083% nebulizer solution Take 3 mLs (2.5 mg total) by nebulization every 6 (six) hours  as needed for wheezing or shortness of breath. 75 mL 12  . ALPRAZolam (XANAX) 1 MG tablet Take 0.5-2 mg by mouth See admin instructions. 0.5 mg in the morning and afternoon and take 2mg  at bedtime    . Aspirin-Salicylamide-Caffeine (BC FAST PAIN RELIEF) 650-195-33.3 MG PACK Take 1 packet by mouth every 8 (eight) hours as needed (for knee pain.).    . Azelastine-Fluticasone 137-50 MCG/ACT SUSP 1 spray each nostril twice daily 23 g 6  . bisoprolol (ZEBETA) 5 MG tablet Take 0.5 tablets (2.5 mg total) by mouth daily. 90 tablet 0  . cholecalciferol (VITAMIN D) 1000 units tablet Take 1,000 Units by mouth daily.    . furosemide (LASIX) 80 MG tablet Take 1 tablet (80 mg total) by mouth 2 (two) times daily. 180 tablet 1  . HYDROcodone-homatropine (HYCODAN) 5-1.5 MG/5ML syrup Take 5 mLs by mouth every 6 (six) hours as needed for cough. 240 mL 0  . losartan (COZAAR) 100 MG tablet Take 100 mg by mouth daily.    . mometasone-formoterol (DULERA) 200-5 MCG/ACT AERO Take 2 puffs first thing in am and then another 2 puffs about 12 hours later. 1 Inhaler 11  . omeprazole (PRILOSEC) 20 MG capsule Take 20 mg by mouth 2 (two) times daily. ACID REFLUX    . predniSONE (DELTASONE) 1 MG tablet Take 3 tablets (3 mg total) by mouth daily with breakfast.  90 tablet 2  . spironolactone (ALDACTONE) 50 MG tablet Take 1 tablet (50 mg total) by mouth at bedtime. 30 tablet 6  . Testosterone 30 MG/ACT SOLN Apply 1 application topically daily. Apply under each arm daily    . Tiotropium Bromide Monohydrate (SPIRIVA RESPIMAT) 2.5 MCG/ACT AERS Inhale 2.5 mcg into the lungs 2 (two) times daily. 4 g 3   No current facility-administered medications for this encounter.    Allergies  Allergen Reactions  . Clindamycin Other (See Comments)    Tongue swelling  . E-Mycin [Erythromycin Base] Swelling  . Penicillins Swelling and Other (See Comments)    Childhood rxn--MD stated he "almost died" Has patient had a PCN reaction causing  immediate rash, facial/tongue/throat swelling, SOB or lightheadedness with hypotension:Yes Has patient had a PCN reaction causing severe rash involving mucus membranes or skin necrosis:unsure Has patient had a PCN reaction that required hospitalization:unsure Has patient had a PCN reaction occurring within the last 10 years:No If all of the above answers are "NO", then may proceed with Cephalosporin use.    . Rosuvastatin     Other reaction(s): Myalgias (intolerance)      Social History   Socioeconomic History  . Marital status: Single    Spouse name: Not on file  . Number of children: Not on file  . Years of education: Not on file  . Highest education level: Not on file  Occupational History  . Not on file  Tobacco Use  . Smoking status: Current Every Day Smoker    Packs/day: 0.50    Years: 46.00    Pack years: 23.00    Types: Cigarettes  . Smokeless tobacco: Never Used  Substance and Sexual Activity  . Alcohol use: No    Alcohol/week: 0.0 standard drinks  . Drug use: No  . Sexual activity: Yes    Birth control/protection: None  Other Topics Concern  . Not on file  Social History Narrative  . Not on file   Social Determinants of Health   Financial Resource Strain:   . Difficulty of Paying Living Expenses: Not on file  Food Insecurity:   . Worried About Charity fundraiser in the Last Year: Not on file  . Ran Out of Food in the Last Year: Not on file  Transportation Needs:   . Lack of Transportation (Medical): Not on file  . Lack of Transportation (Non-Medical): Not on file  Physical Activity:   . Days of Exercise per Week: Not on file  . Minutes of Exercise per Session: Not on file  Stress:   . Feeling of Stress : Not on file  Social Connections:   . Frequency of Communication with Friends and Family: Not on file  . Frequency of Social Gatherings with Friends and Family: Not on file  . Attends Religious Services: Not on file  . Active Member of Clubs or  Organizations: Not on file  . Attends Archivist Meetings: Not on file  . Marital Status: Not on file  Intimate Partner Violence:   . Fear of Current or Ex-Partner: Not on file  . Emotionally Abused: Not on file  . Physically Abused: Not on file  . Sexually Abused: Not on file      Family History  Problem Relation Age of Onset  . Lung cancer Father   . Brain cancer Father     Vitals:   01/11/20 1411  BP: 136/82  Pulse: 82  SpO2: 92%  Weight: 104.7 kg (230  lb 12.8 oz)    PHYSICAL EXAM: General:  Obese male. No resp difficulty at rest  HEENT: normal Neck: supple. no JVD. Carotids 2+ bilat; no bruits. No lymphadenopathy or thryomegaly appreciated. Cor: PMI nondisplaced. Regular rate & rhythm. No rubs, gallops or murmurs. Lungs: +end-expirattory wheeze Abdomen: obese soft, nontender, nondistended. No hepatosplenomegaly. No bruits or masses. Good bowel sounds. Extremities: no cyanosis, clubbing, rash, 2+ edema Neuro: alert & orientedx3, cranial nerves grossly intact. moves all 4 extremities w/o difficulty. Affect pleasant     ASSESSMENT & PLAN:  1. Chronic systolic HF due to NICM  - cath 4/18 normal cors. EF 48%  - Myoview 07/21/2018 showed EF 38% inferior scar with septal ischemia.   - Echo 07/07/2018 revealed EF 30-35%. RV normal.  - Cardiac CT 08/26/18.  Calcium score 148 (74th percentile)  LAD 25% otherwise normal cors. Dilated pulmonary arteries suggestive of PH.  - Suspect NICM due to HTN and inadequately treated OSA - Echo 12/20 EF 45% mild RV dysfunction - He is NYHA III due to a combination of HF, arthirtis and COPD  - Currently volume overloaded in setting of prednisone therapy and high fluid intake - Switch lasix 80 to torsemide 40 daily. Limit fluid intake - Place compression hose.  - Continue losartan 100 (failed Entresto) - Continue Spiro - On bisoprolol 2.5 (would not titrate with ongoing wheezing) - Stressed need to take lasix every day and double  up as needed - F/u 1 months  2. HTN - Blood pressure well controlled. Continue current regimen.  3. COPD with ongoing tobacco use - Encouraged cessation. Follows with Pulmonary (Robert Church) - stressed need to stop smoking  4. OSA - inadequately treated. Refer to Robert Church to help with CPAP complaince  5. Morbid obesity  - stressed need for weight loss  6. Trifascicular block - Followed by Dr. Sanda Klein, MD  2:31 PM

## 2020-01-11 NOTE — Patient Instructions (Signed)
STOP Lasix   START Torsemide 40mg  (2 tabs) daily    You were given a prescription for compression stockings.  Please take it to [facility] located at [address]   Reva Bores today We will only contact you if something comes back abnormal or we need to make some changes. Otherwise no news is good news!   Your physician recommends that you schedule a follow-up appointment in: 1 month with Dr Haroldine Laws   Please call office at [phone] option [phone] if you have any questions or concerns.    At the [facility], you and your health needs are our priority. As part of our continuing mission to provide you with exceptional heart care, we have created designated Provider Care Teams. These Care Teams include your primary Cardiologist (physician) and Advanced Practice Providers (APPs- Physician Assistants and Nurse Practitioners) who all work together to provide you with the care you need, when you need it.   You may see any of the following providers on your designated Care Team at your next follow up: Marland Kitchen Dr Glori Bickers . Dr Loralie Champagne . Darrick Grinder, NP . Lyda Jester, PA . Audry Riles, PharmD   Please be sure to bring in all your medications bottles to every appointment.

## 2020-01-22 ENCOUNTER — Other Ambulatory Visit: Payer: Self-pay | Admitting: Cardiovascular Disease

## 2020-01-22 NOTE — Telephone Encounter (Signed)
New Message     *STAT* If patient is at the pharmacy, call can be transferred to refill team.   1. Which medications need to be refilled? (please list name of each medication and dose if known) losartan (COZAAR) 100 MG tablet  2. Which pharmacy/location (including street and city if local pharmacy) is medication to be sent to? [place]  3. Do they need a 30 day or 90 day supply? 90  Pt needs medication by tomorrow

## 2020-01-25 ENCOUNTER — Other Ambulatory Visit (HOSPITAL_COMMUNITY): Payer: Self-pay | Admitting: *Deleted

## 2020-01-25 MED ORDER — LOSARTAN POTASSIUM 100 MG PO TABS: 100.0000 mg | ORAL_TABLET | Freq: Every day | ORAL | 3 refills | Status: DC

## 2020-01-25 NOTE — Telephone Encounter (Signed)
Done

## 2020-01-25 NOTE — Telephone Encounter (Signed)
Can you help with this? Thanks

## 2020-02-07 ENCOUNTER — Other Ambulatory Visit: Payer: Self-pay

## 2020-02-07 ENCOUNTER — Ambulatory Visit (INDEPENDENT_AMBULATORY_CARE_PROVIDER_SITE_OTHER): Payer: BC Managed Care – PPO | Admitting: Emergency Medicine

## 2020-02-07 ENCOUNTER — Encounter: Payer: Self-pay | Admitting: Emergency Medicine

## 2020-02-07 DIAGNOSIS — I5042 Chronic combined systolic (congestive) and diastolic (congestive) heart failure: Secondary | ICD-10-CM | POA: Diagnosis not present

## 2020-02-07 DIAGNOSIS — J449 Chronic obstructive pulmonary disease, unspecified: Secondary | ICD-10-CM

## 2020-02-07 DIAGNOSIS — F17210 Nicotine dependence, cigarettes, uncomplicated: Secondary | ICD-10-CM

## 2020-02-07 MED ORDER — DULERA 200-5 MCG/ACT IN AERO: INHALATION_SPRAY | RESPIRATORY_TRACT | 4 refills | Status: DC

## 2020-02-07 MED ORDER — ALBUTEROL SULFATE HFA 108 (90 BASE) MCG/ACT IN AERS: 2.0000 | INHALATION_SPRAY | Freq: Four times a day (QID) | RESPIRATORY_TRACT | 4 refills | Status: DC | PRN

## 2020-02-07 MED ORDER — SPIRIVA RESPIMAT 2.5 MCG/ACT IN AERS: 2.5000 ug | INHALATION_SPRAY | Freq: Two times a day (BID) | RESPIRATORY_TRACT | 4 refills | Status: DC

## 2020-02-07 NOTE — Addendum Note (Signed)
Addended by: Tery Sanfilippo R on: 02/07/2020 02:21 PM   Modules accepted: Orders

## 2020-02-07 NOTE — Patient Instructions (Signed)
Stop prednisone at this time.  Keep track of how your symptoms change off the medication.  Call us if you have difficulty with breathing, mucus production, cough Continue your Dulera and Spiriva as you have been taking them.  Rinse and gargle after using the [facility]. Use your albuterol either one nebulizer treatment or 2 puffs up to every 4 hours if needed for shortness of breath, chest tightness, wheezing. Please call Dr. Alvester Chou and Dr. Haroldine Laws to review your cardiac medications, side effects.  They want to know exactly which medications you are still on and will make recommendations about how to adjust. Agree that you would benefit from the COVID-19 vaccine. Work hard on continuing to decrease her cigarettes.  Our goal is for her to stop altogether. Follow with Dr Lamonte Sakai in 4 months or sooner if you have any problems.   COVID-19 Vaccine Information can be found at: ShippingScam.co.uk For questions related to vaccine distribution or appointments, please email vaccine@[email or web address] [email or web address] or call [phone] [phone].

## 2020-02-07 NOTE — Assessment & Plan Note (Signed)
Discussed cessation with him today 

## 2020-02-07 NOTE — Assessment & Plan Note (Signed)
He started weaning his prednisone on his own.  Appears to be tolerating for the most part although some increased mucus production especially in the morning.  No overt exacerbation.  I support trying to stop at this point (currently on just over 1 mg daily).   Stop prednisone at this time.  Keep track of how your symptoms change off the medication.  Call us if you have difficulty with breathing, mucus production, cough Continue your Dulera and Spiriva as you have been taking them.  Rinse and gargle after using the [facility]. Use your albuterol either one nebulizer treatment or 2 puffs up to every 4 hours if needed for shortness of breath, chest tightness, wheezing. Agree that you would benefit from the COVID-19 vaccine. Work hard on continuing to decrease her cigarettes.  Our goal is for her to stop altogether. Follow with Dr Lamonte Sakai in 4 months or sooner if you have any problems.

## 2020-02-07 NOTE — Assessment & Plan Note (Signed)
Please call Dr. Alvester Chou and Dr. Haroldine Laws to review your cardiac medications, side effects.  They want to know exactly which medications you are still on and will make recommendations about how to adjust.

## 2020-02-07 NOTE — Progress Notes (Signed)
Patient her to reestablish care  Subjective:  Patient ID: Robert Church, male    DOB: 18-May-1956  Age: 64 y.o. MRN: [id number]  CC:  Chief Complaint  Patient presents with  . Follow-up    HPI  ROV 12/20/2019 --follow-up visit for Robert Church who has severe COPD with chronic bronchitis, continued tobacco abuse.  He is on chronic prednisone 3 mg daily decreased from 5mg  in November, Dulera and Spiriva.  We have been following a 9 mm right lower lobe nodule on lung cancer screening, due for his next scan 02/2020.  He also has untreated obstructive sleep apnea. He uses ventolin several times a day, sometimes wakes up at night to use. He is on mucinex. Clear mucous. He believes that his breathing has worsened. Smoking less than 10 cig a day.   ROV 02/07/20 --64 year old man with severe COPD and a chronic bronchitic phenotype, untreated sleep apnea, unfortunately with continued tobacco use, about 4-5 cig a day.  He has been on chronic steroids, had decreased to as low as 3 mg but had increased symptoms so at the end of December I treated briefly with 10 mg and then wean down to 5 mg. Then he started weaning himself >> has cut down to about 1mg  a day (1/4 of a 5mg  pill). He has a lot of mucous to clear in the am. He has some nocturnal awakenings in the night. Clear mucous. He has wheeze at night when he lays down. On Dulera and Spiriva, uses albuterol about tid - not always helpful.   He continues to smoke approximately .  Also followed for systolic CHF, NICM.  Recently changed from furosemide to torsemide > swelling is better.   I reviewed the cardiology notes from Dr. Alvester Chou and from Dr. Haroldine Laws on 1/21 I Reviewed his prior sleep study and his carotid ultrasound from 10/12/2019.   ROS Review of Systems  Constitutional: Negative for activity change, fatigue, fever and unexpected weight change.  HENT: Negative for congestion, postnasal drip, rhinorrhea and sinus pain.   Eyes: Negative for redness.    Respiratory: Positive for apnea, cough, shortness of breath and wheezing. Negative for choking, chest tightness and stridor.   Cardiovascular: Positive for leg swelling. Negative for chest pain and palpitations.  Gastrointestinal: Positive for abdominal distention. Negative for abdominal pain, diarrhea and nausea.  Endocrine: Negative for polyuria.  Genitourinary: Negative for dysuria and hematuria.  Musculoskeletal: Negative for back pain, gait problem and myalgias.  Skin: Negative for rash and wound.  Neurological: Negative for dizziness, seizures, weakness and headaches.  Psychiatric/Behavioral: Negative for agitation and self-injury.    Objective:   Today's Vitals: BP 130/84 (BP Location: Right Arm, Cuff Size: Normal)   Pulse 90   Temp 98 F (36.7 C) (Temporal)   Ht 5\' 10"  (1.778 m)   Wt 231 lb 9.6 oz (105.1 kg)   SpO2 93% Comment: RA  BMI 33.23 kg/m   Gen: Pleasant, well-nourished, in no distress,  normal affect  ENT: No lesions,  mouth clear,  oropharynx clear, no postnasal drip  Neck: No JVD, no stridor  Lungs: No use of accessory muscles, no crackles or wheezing on normal respiration, no wheeze on forced expiration  Cardiovascular: RRR, heart sounds normal, no murmur or gallops, no peripheral edema  Musculoskeletal: No deformities, no cyanosis or clubbing  Neuro: alert, awake, non focal  Skin: Warm, no lesions or rash   Assessment & Plan:   Problem List Items Addressed This Visit  COPD, group D, by GOLD 2017 classification [facility])    He started weaning his prednisone on his own.  Appears to be tolerating for the most part although some increased mucus production especially in the morning.  No overt exacerbation.  I support trying to stop at this point (currently on just over 1 mg daily).   Stop prednisone at this time.  Keep track of how your symptoms change off the medication.  Call us if you have difficulty with breathing, mucus production, cough Continue  your Dulera and Spiriva as you have been taking them.  Rinse and gargle after using the [facility]. Use your albuterol either one nebulizer treatment or 2 puffs up to every 4 hours if needed for shortness of breath, chest tightness, wheezing. Agree that you would benefit from the COVID-19 vaccine. Work hard on continuing to decrease her cigarettes.  Our goal is for her to stop altogether. Follow with Dr Lamonte Sakai in 4 months or sooner if you have any problems.      Cigarette smoker    Discussed cessation with him today.       Chronic combined systolic and diastolic CHF (congestive heart failure) ([place])    Please call Dr. Alvester Chou and Dr. Haroldine Laws to review your cardiac medications, side effects.  They want to know exactly which medications you are still on and will make recommendations about how to adjust.         Outpatient Encounter Medications as of 02/07/2020  Medication Sig  . albuterol (PROVENTIL HFA;VENTOLIN HFA) 108 (90 BASE) MCG/ACT inhaler Inhale 2 puffs into the lungs every 6 (six) hours as needed for wheezing.  Marland Kitchen albuterol (PROVENTIL) (2.5 MG/3ML) 0.083% nebulizer solution Take 3 mLs (2.5 mg total) by nebulization every 6 (six) hours as needed for wheezing or shortness of breath.  . ALPRAZolam (XANAX) 1 MG tablet Take 0.5-2 mg by mouth See admin instructions. 0.5 mg in the morning and afternoon and take 2mg  at bedtime  . Aspirin-Salicylamide-Caffeine (BC FAST PAIN RELIEF) 650-195-33.3 MG PACK Take 1 packet by mouth every 8 (eight) hours as needed (for knee pain.).  . Azelastine-Fluticasone 137-50 MCG/ACT SUSP 1 spray each nostril twice daily  . bisoprolol (ZEBETA) 5 MG tablet Take 0.5 tablets (2.5 mg total) by mouth daily.  . cholecalciferol (VITAMIN D) 1000 units tablet Take 1,000 Units by mouth daily.  Marland Kitchen HYDROcodone-homatropine (HYCODAN) 5-1.5 MG/5ML syrup Take 5 mLs by mouth every 6 (six) hours as needed for cough.  . losartan (COZAAR) 100 MG tablet Take 1 tablet (100 mg total) by  mouth daily.  . mometasone-formoterol (DULERA) 200-5 MCG/ACT AERO Take 2 puffs first thing in am and then another 2 puffs about 12 hours later.  Marland Kitchen omeprazole (PRILOSEC) 20 MG capsule Take 20 mg by mouth 2 (two) times daily. ACID REFLUX  . predniSONE (DELTASONE) 1 MG tablet Take 3 tablets (3 mg total) by mouth daily with breakfast.  . spironolactone (ALDACTONE) 50 MG tablet Take 1 tablet (50 mg total) by mouth at bedtime.  . Testosterone 30 MG/ACT SOLN Apply 1 application topically daily. Apply under each arm daily  . Tiotropium Bromide Monohydrate (SPIRIVA RESPIMAT) 2.5 MCG/ACT AERS Inhale 2.5 mcg into the lungs 2 (two) times daily.  Marland Kitchen torsemide (DEMADEX) 20 MG tablet Take 2 tablets (40 mg total) by mouth daily.   No facility-administered encounter medications on file as of 02/07/2020.    Follow-up: No follow-ups on file.   COPD, group D, by GOLD 2017 classification [facility]) He started weaning  his prednisone on his own.  Appears to be tolerating for the most part although some increased mucus production especially in the morning.  No overt exacerbation.  I support trying to stop at this point (currently on just over 1 mg daily).   Stop prednisone at this time.  Keep track of how your symptoms change off the medication.  Call us if you have difficulty with breathing, mucus production, cough Continue your Dulera and Spiriva as you have been taking them.  Rinse and gargle after using the [facility]. Use your albuterol either one nebulizer treatment or 2 puffs up to every 4 hours if needed for shortness of breath, chest tightness, wheezing. Agree that you would benefit from the COVID-19 vaccine. Work hard on continuing to decrease her cigarettes.  Our goal is for her to stop altogether. Follow with Dr Lamonte Sakai in 4 months or sooner if you have any problems.  Chronic combined systolic and diastolic CHF (congestive heart failure) ([place]) Please call Dr. Alvester Chou and Dr. Haroldine Laws to review your cardiac  medications, side effects.  They want to know exactly which medications you are still on and will make recommendations about how to adjust.  Cigarette smoker Discussed cessation with him today.    Baltazar Apo, MD, PhD 02/07/2020, 2:17 PM [place] [facility] [phone] or if no answer [phone]

## 2020-02-08 ENCOUNTER — Telehealth: Payer: Self-pay | Admitting: Adult Health

## 2020-02-08 NOTE — Telephone Encounter (Signed)
Complains of sinus issues - c/o of sinus drainage , runny nose - clear  Congestion x 1 d .  Not taking any otc meds.   Begin Zyrtec 10mg  At bedtime  For 1 week then As needed   Saline nasal Twice daily   Use dymista daily   Please contact office for sooner follow up if symptoms do not improve or worsen or seek emergency care

## 2020-02-13 ENCOUNTER — Telehealth (HOSPITAL_COMMUNITY): Payer: Self-pay

## 2020-02-13 NOTE — Telephone Encounter (Signed)

## 2020-02-14 ENCOUNTER — Other Ambulatory Visit: Payer: Self-pay

## 2020-02-14 ENCOUNTER — Ambulatory Visit (HOSPITAL_COMMUNITY)
Admission: RE | Admit: 2020-02-14 | Discharge: 2020-02-14 | Disposition: A | Discharge status: Home / Self Care | Payer: BC Managed Care – PPO | Source: Ambulatory Visit | Attending: Internal Medicine | Admitting: Internal Medicine

## 2020-02-14 ENCOUNTER — Encounter (HOSPITAL_COMMUNITY): Payer: Self-pay | Admitting: Internal Medicine

## 2020-02-14 VITALS — BP 132/70 | HR 77 | Wt 231.0 lb

## 2020-02-14 DIAGNOSIS — I10 Essential (primary) hypertension: Secondary | ICD-10-CM

## 2020-02-14 DIAGNOSIS — I428 Other cardiomyopathies: Secondary | ICD-10-CM | POA: Diagnosis not present

## 2020-02-14 DIAGNOSIS — G4730 Sleep apnea, unspecified: Secondary | ICD-10-CM | POA: Diagnosis not present

## 2020-02-14 DIAGNOSIS — Z79899 Other long term (current) drug therapy: Secondary | ICD-10-CM | POA: Diagnosis not present

## 2020-02-14 DIAGNOSIS — Z6833 Body mass index (BMI) 33.0-33.9, adult: Secondary | ICD-10-CM | POA: Insufficient documentation

## 2020-02-14 DIAGNOSIS — I453 Trifascicular block: Secondary | ICD-10-CM | POA: Insufficient documentation

## 2020-02-14 DIAGNOSIS — Z888 Allergy status to other drugs, medicaments and biological substances status: Secondary | ICD-10-CM | POA: Insufficient documentation

## 2020-02-14 DIAGNOSIS — I5022 Chronic systolic (congestive) heart failure: Secondary | ICD-10-CM

## 2020-02-14 DIAGNOSIS — J449 Chronic obstructive pulmonary disease, unspecified: Secondary | ICD-10-CM | POA: Insufficient documentation

## 2020-02-14 DIAGNOSIS — Z808 Family history of malignant neoplasm of other organs or systems: Secondary | ICD-10-CM | POA: Insufficient documentation

## 2020-02-14 DIAGNOSIS — Z881 Allergy status to other antibiotic agents status: Secondary | ICD-10-CM | POA: Diagnosis not present

## 2020-02-14 DIAGNOSIS — F17210 Nicotine dependence, cigarettes, uncomplicated: Secondary | ICD-10-CM | POA: Insufficient documentation

## 2020-02-14 DIAGNOSIS — Z880 Allergy status to penicillin: Secondary | ICD-10-CM | POA: Insufficient documentation

## 2020-02-14 DIAGNOSIS — Z7951 Long term (current) use of inhaled steroids: Secondary | ICD-10-CM | POA: Diagnosis not present

## 2020-02-14 DIAGNOSIS — K219 Gastro-esophageal reflux disease without esophagitis: Secondary | ICD-10-CM | POA: Insufficient documentation

## 2020-02-14 DIAGNOSIS — Z801 Family history of malignant neoplasm of trachea, bronchus and lung: Secondary | ICD-10-CM | POA: Diagnosis not present

## 2020-02-14 DIAGNOSIS — I110 Hypertensive heart disease with heart failure: Secondary | ICD-10-CM | POA: Insufficient documentation

## 2020-02-14 DIAGNOSIS — G4733 Obstructive sleep apnea (adult) (pediatric): Secondary | ICD-10-CM

## 2020-02-14 LAB — BASIC METABOLIC PANEL
Anion gap: 10 (ref 5–15)
BUN: 17 mg/dL (ref 8–23)
CO2: 26 mmol/L (ref 22–32)
Calcium: 9.5 mg/dL (ref 8.9–10.3)
Chloride: 104 mmol/L (ref 98–111)
Creatinine, Ser: 0.84 mg/dL (ref 0.61–1.24)
GFR calc Af Amer: >60 mL/min (ref >60)
GFR calc non Af Amer: >60 mL/min (ref >60)
Glucose, Bld: 106 mg/dL — ABNORMAL HIGH (ref 70–99)
Potassium: 4.4 mmol/L (ref 3.5–5.1)
Sodium: 140 mmol/L (ref 135–145)

## 2020-02-14 NOTE — Patient Instructions (Signed)
Routine lab work today. Will notify you of abnormal results  You have been referred to [facility] for CPAP titration (they will contact you to schedule appointment)  Follow up in 6 months.  Please call our office at [phone] in July to schedule your August appointment.

## 2020-02-14 NOTE — Progress Notes (Signed)
ADVANCED HF CLINIC NOTE   HPI:  Robert Church is a 64 y/o male with h/o obesity, HTN, COPD with ongoing tobacco use and chronic systolic HF referred by Robert Church for further management of his HF.   He underwent cath in 4/18 due to CP. Cath showed normal coronaries with EF 40% and global HK. LVEDP 19. He was referred for a sleep study.   He saw Robert Church on 07/01/2018 because of recurrent chest pain.  Myoview stress test performed 07/21/2018 showed EF 38% inferior scar with septal ischemia.  A 2D echo performed 07/07/2018 revealed an EF of 30 to 35%.  He was referred for cardiac CT.   Cardiac CT on 08/26/18.  Calcium score 148 (74th percentile)  LAD 25% otherwise normal cors. Dilated pulmonary arteries suggestive of PH.   Works for [employer]. Still smoking 2-3 cigs/day. Follows with Robert Church in Pulmonary Clinic. Recently started on bisoprolol 2.5 but couldn't tolerate due to fatigue. Was falling asleep at work. Stopped bisoprolol and felt better. Has CPAP but it is not working. Denies CP, orthopnea or PND. At last visit I switched to lasix to torsemide and edema much better. Still smoking 3-4 cig/day  Echo 12/20 EF improved to 45%. Mild RV dysfunction   PFTs 3/18 FEV1 2.54 (72%) FVC   3.48 (75%) DLCO 107%   Past Medical History:  Diagnosis Date  . Allergy   . Asthma   . Bronchitis   . COPD (chronic obstructive pulmonary disease) ([place])   . GERD (gastroesophageal reflux disease)   . Hypertension   . Pneumonia   . Sinusitis   . SOB (shortness of breath)     Current Outpatient Medications  Medication Sig Dispense Refill  . albuterol (PROVENTIL) (2.5 MG/3ML) 0.083% nebulizer solution Take 3 mLs (2.5 mg total) by nebulization every 6 (six) hours as needed for wheezing or shortness of breath. 75 mL 12  . albuterol (VENTOLIN HFA) 108 (90 Base) MCG/ACT inhaler Inhale 2 puffs into the lungs every 6 (six) hours as needed for wheezing. 18 g 4  . ALPRAZolam (XANAX) 1 MG tablet Take  0.5-2 mg by mouth See admin instructions. 0.5 mg in the morning and afternoon and take 2mg  at bedtime    . Aspirin-Salicylamide-Caffeine (BC FAST PAIN RELIEF) 650-195-33.3 MG PACK Take 1 packet by mouth every 8 (eight) hours as needed (for knee pain.).    . Azelastine-Fluticasone 137-50 MCG/ACT SUSP 1 spray each nostril twice daily 23 g 6  . bisoprolol (ZEBETA) 5 MG tablet Take 0.5 tablets (2.5 mg total) by mouth daily. 90 tablet 0  . cholecalciferol (VITAMIN D) 1000 units tablet Take 1,000 Units by mouth daily.    Marland Kitchen HYDROcodone-homatropine (HYCODAN) 5-1.5 MG/5ML syrup Take 5 mLs by mouth every 6 (six) hours as needed for cough. 240 mL 0  . losartan (COZAAR) 100 MG tablet Take 1 tablet (100 mg total) by mouth daily. 90 tablet 3  . mometasone-formoterol (DULERA) 200-5 MCG/ACT AERO Take 2 puffs first thing in am and then another 2 puffs about 12 hours later. 13 g 4  . omeprazole (PRILOSEC) 20 MG capsule Take 20 mg by mouth 2 (two) times daily. ACID REFLUX    . spironolactone (ALDACTONE) 50 MG tablet Take 1 tablet (50 mg total) by mouth at bedtime. 30 tablet 6  . Testosterone 30 MG/ACT SOLN Apply 1 application topically daily. Apply under each arm daily    . Tiotropium Bromide Monohydrate (SPIRIVA RESPIMAT) 2.5 MCG/ACT AERS Inhale 2.5  mcg into the lungs 2 (two) times daily. 4 g 4  . torsemide (DEMADEX) 20 MG tablet Take 2 tablets (40 mg total) by mouth daily. 180 tablet 3   No current facility-administered medications for this encounter.    Allergies  Allergen Reactions  . Clindamycin Other (See Comments)    Tongue swelling  . E-Mycin [Erythromycin Base] Swelling  . Penicillins Swelling and Other (See Comments)    Childhood rxn--MD stated he "almost died" Has patient had a PCN reaction causing immediate rash, facial/tongue/throat swelling, SOB or lightheadedness with hypotension:Yes Has patient had a PCN reaction causing severe rash involving mucus membranes or skin necrosis:unsure Has patient  had a PCN reaction that required hospitalization:unsure Has patient had a PCN reaction occurring within the last 10 years:No If all of the above answers are "NO", then may proceed with Cephalosporin use.    . Rosuvastatin     Other reaction(s): Myalgias (intolerance)      Social History   Socioeconomic History  . Marital status: Single    Spouse name: Not on file  . Number of children: Not on file  . Years of education: Not on file  . Highest education level: Not on file  Occupational History  . Not on file  Tobacco Use  . Smoking status: Current Every Day Smoker    Packs/day: 0.50    Years: 46.00    Pack years: 23.00    Types: Cigarettes  . Smokeless tobacco: Never Used  Substance and Sexual Activity  . Alcohol use: No    Alcohol/week: 0.0 standard drinks  . Drug use: No  . Sexual activity: Yes    Birth control/protection: None  Other Topics Concern  . Not on file  Social History Narrative  . Not on file   Social Determinants of Health   Financial Resource Strain:   . Difficulty of Paying Living Expenses: Not on file  Food Insecurity:   . Worried About Charity fundraiser in the Last Year: Not on file  . Ran Out of Food in the Last Year: Not on file  Transportation Needs:   . Lack of Transportation (Medical): Not on file  . Lack of Transportation (Non-Medical): Not on file  Physical Activity:   . Days of Exercise per Week: Not on file  . Minutes of Exercise per Session: Not on file  Stress:   . Feeling of Stress : Not on file  Social Connections:   . Frequency of Communication with Friends and Family: Not on file  . Frequency of Social Gatherings with Friends and Family: Not on file  . Attends Religious Services: Not on file  . Active Member of Clubs or Organizations: Not on file  . Attends Archivist Meetings: Not on file  . Marital Status: Not on file  Intimate Partner Violence:   . Fear of Current or Ex-Partner: Not on file  . Emotionally  Abused: Not on file  . Physically Abused: Not on file  . Sexually Abused: Not on file      Family History  Problem Relation Age of Onset  . Lung cancer Father   . Brain cancer Father     Vitals:   02/14/20 1432  BP: 132/70  Pulse: 77  SpO2: 94%  Weight: 104.8 kg (231 lb)    PHYSICAL EXAM: General:  Obese male No resp difficulty HEENT: normal Neck: supple. no JVD. Carotids 2+ bilat; no bruits. No lymphadenopathy or thryomegaly appreciated. Cor: PMI nondisplaced. Regular rate &  rhythm. No rubs, gallops or murmurs. Lungs: clear decreased BS throughout  Abdomen: soft, nontender, nondistended. No hepatosplenomegaly. No bruits or masses. Good bowel sounds. Extremities: no cyanosis, clubbing, rash, trace edema Neuro: alert & orientedx3, cranial nerves grossly intact. moves all 4 extremities w/o difficulty. Affect pleasant   ASSESSMENT & PLAN:  1. Chronic systolic HF due to NICM  - cath 4/18 normal cors. EF 48%  - Myoview 07/21/2018 showed EF 38% inferior scar with septal ischemia.   - Echo 07/07/2018 revealed EF 30-35%. RV normal.  - Cardiac CT 08/26/18.  Calcium score 148 (74th percentile)  LAD 25% otherwise normal cors. Dilated pulmonary arteries suggestive of PH.  - Suspect NICM due to HTN and inadequately treated OSA - Echo 12/20 EF 45% mild RV dysfunction - Stable NYHA III due to a combination of HF, arthirtis and COPD  - Volume status improved with switch to torsemide - Continue torsemide 40 daily. Limit fluid intake - Place compression hose.  - Continue losartan 100 (failed Entresto) - Continue Arlyce Harman - Could not tolerate bisoprolol due to fatigue.  - Check labs today  2. HTN - Blood pressure well controlled. Continue current regimen.  3. COPD with ongoing tobacco use - Encouraged cessation. Follows with Pulmonary (Robert Church) - stressed need for smoking cessation   4. OSA - inadequately treated. Will refer to Robert Church to help with CPAP complaince  5. Morbid  obesity  - stressed need for weight loss  6. Trifascicular block - Followed by Dr. Sanda Klein, MD  2:44 PM

## 2020-03-08 ENCOUNTER — Telehealth: Payer: Self-pay | Admitting: Emergency Medicine

## 2020-03-08 MED ORDER — LEVOFLOXACIN 500 MG PO TABS: 500.0000 mg | ORAL_TABLET | Freq: Two times a day (BID) | ORAL | 0 refills | Status: DC

## 2020-03-08 NOTE — Telephone Encounter (Signed)
Spoke with pt, states that he does not tolerate doxycycline.  Pt states this gives him severe GI upset and he cannot take this.  I have added this to pt's allergy list.  Pt states he is typically given Levaquin when he has an exacerbation.  RB please advise if you are ok with switching from doxycycline to Levaquin.  Thanks!

## 2020-03-08 NOTE — Telephone Encounter (Signed)
He needs to be treated for an acute exacerbation.  Please give him doxycycline 100 g twice daily for 7 days and prednisone as below.  Have him call us if he is not improving next week Prednisone > Take 40mg  daily for 3 days, then 30mg  daily for 3 days, then 20mg  daily for 3 days, then 10mg  daily for 3 days, then stop

## 2020-03-08 NOTE — Telephone Encounter (Signed)
Spoke with patient. He verbalized understanding. Levaquin will be called in [place] [address].   Nothing further needed at time of call.

## 2020-03-08 NOTE — Telephone Encounter (Signed)
Called and spoke with pt letting him know that we have sent request back to [name] to see if he is fine with [place] switching him from doxycycline to levaquin since he could not tolerate doxycycline. Stated to pt once we have heard back from [name], we will call him back and pt verbalized understanding.

## 2020-03-08 NOTE — Telephone Encounter (Signed)
Pt calling back regarding medication for congestion.  Please advise.  [phone].

## 2020-03-08 NOTE — Telephone Encounter (Signed)
Yes switch to levaquin 500 bid x 5 days.

## 2020-03-08 NOTE — Telephone Encounter (Signed)
Pt c/o increased sob, prod cough with clear/yellow mucus worse qhs, sob, sinus congestion, pnd X3-4 days.  Pt states he has been taking mucinex which is not helping to suppress his cough.  States he is having difficulty sleeping through the night with this cough, adding to his fatigue.   Denies fever, chest pain, hemoptysis, loss of taste/smell, headache.   Has been taking mucinex t help with s/s, in addition to maintenance meds (albuterol neb prn, albuterol hfa prn, dulera 2 puff bid, spiriva 2 puff qd).  Pt requesting additional recs.    Pharmacy: [facility].    RB please advise on additional recs.  Thanks!

## 2020-03-11 ENCOUNTER — Telehealth: Payer: Self-pay | Admitting: Cardiovascular Disease

## 2020-03-11 NOTE — Telephone Encounter (Signed)
Returned the call to the patient. He stated that since he has been on the Losartan, he has been experiencing muscle pains and aches. This normally starts an hour after taking the medication. He has been on the Losartan for over a year now but states the pain is getting worse.  He stated that when he does not take the Losartan, he does not have the pain. He has not been started on any new medications so feels it is the Losartan.  He would like to know if he could try another medication. He has tried and failed Entresto in the past.   Blood pressure this morning before medications was 150/82.

## 2020-03-11 NOTE — Telephone Encounter (Signed)
Pt c/o medication issue:  1. Name of Medication: losartan (COZAAR) 100 MG tablet  2. How are you currently taking this medication (dosage and times per day)? 1 tablet by mouth daily   3. Are you having a reaction (difficulty breathing--STAT)? Yes   4. What is your medication issue? Robert Church is calling requesting he be taken off Losartan and be switched over to something else due to having a reaction to the medication. He states every time he takes this medication it causes pain in his legs to the point he can hardly walk. The pain becomes so severe he has to take [facility] powder to make it bearable. Please advise.

## 2020-03-12 NOTE — Telephone Encounter (Signed)
Lets try entresto 24/26 bid one more time.

## 2020-03-12 NOTE — Telephone Encounter (Signed)
Left message to call back  

## 2020-03-14 MED ORDER — ENTRESTO 24-26 MG PO TABS: 1.0000 | ORAL_TABLET | Freq: Two times a day (BID) | ORAL | 3 refills | Status: DC

## 2020-03-14 NOTE — Telephone Encounter (Signed)
Pt called back he is aware and agreeable with plan.

## 2020-03-19 ENCOUNTER — Telehealth: Payer: Self-pay | Admitting: Cardiovascular Disease

## 2020-03-19 NOTE — Telephone Encounter (Signed)
Patient returned call to schedule with Dr. Claiborne Billings. Transferred call to [place].

## 2020-04-02 ENCOUNTER — Telehealth: Payer: Self-pay | Admitting: Emergency Medicine

## 2020-04-02 NOTE — Telephone Encounter (Signed)
If he feels like he is backtracking then we may need to consider this. Is he doing any other meds for increased allergies? He may needs a phone / video visit w APP to trouble shoot. If we do go back to prednisone, would probably need to start 10mg  and then get down to lowest dose he can tolerate over several weeks.

## 2020-04-02 NOTE — Telephone Encounter (Signed)
[name] please advise:  Patient would like to know if he needs to go back on Prednisone. States due to the pollen season. Pharmacy is [facility]. States has some Prednisone left. Patient phone number is [phone].

## 2020-04-03 ENCOUNTER — Ambulatory Visit (INDEPENDENT_AMBULATORY_CARE_PROVIDER_SITE_OTHER): Payer: BC Managed Care – PPO | Admitting: Primary Care

## 2020-04-03 ENCOUNTER — Telehealth: Payer: Self-pay | Admitting: Primary Care

## 2020-04-03 ENCOUNTER — Encounter: Payer: Self-pay | Admitting: Primary Care

## 2020-04-03 DIAGNOSIS — J449 Chronic obstructive pulmonary disease, unspecified: Secondary | ICD-10-CM | POA: Diagnosis not present

## 2020-04-03 DIAGNOSIS — J302 Other seasonal allergic rhinitis: Secondary | ICD-10-CM | POA: Diagnosis not present

## 2020-04-03 MED ORDER — ROFLUMILAST 500 MCG PO TABS: 500.0000 ug | ORAL_TABLET | Freq: Every day | ORAL | 11 refills | Status: DC

## 2020-04-03 MED ORDER — MONTELUKAST SODIUM 10 MG PO TABS: 10.0000 mg | ORAL_TABLET | Freq: Every day | ORAL | 1 refills | Status: DC

## 2020-04-03 MED ORDER — PREDNISONE 10 MG PO TABS: ORAL_TABLET | ORAL | 0 refills | Status: AC

## 2020-04-03 NOTE — Patient Instructions (Signed)
Start Singulair 10mg  at bedtime Prednisone 10mg  x 2 weeks; then 5mg  daily  Follow up in 4 weeks

## 2020-04-03 NOTE — Progress Notes (Signed)
Virtual Visit via Telephone Note  I connected with Robert Church on 04/03/20 at 10:30 AM EDT by telephone and verified that I am speaking with the correct person using two identifiers.  Location: Patient: Home Provider: Home   I discussed the limitations, risks, security and privacy concerns of performing an evaluation and management service by telephone and the availability of in person appointments. I also discussed with the patient that there may be a patient responsible charge related to this service. The patient expressed understanding and agreed to proceed.   History of Present Illness: 64 year old male, current smoker. PMH significant for COPD gold D, intrinsic asthma, seasonal allergies, sleep apnea, GERD, combined CHF, HTN, nonischemic cardiomyopathy, asbestos exposure. Patient of [name], last seen on 02/07/20. Treated for acute COPD exacerbation in March with Levaquin (unable to tolerate doxycycline) and prednisone taper. LDCT 03/13/20 showed lung RADS, recommend annual follow-up.   04/03/2020 Patient contacted today for acute televisit. Called office yesterday with reports of seasonal allergies including red itchy eyes, post nasal drip, dry cough and headache. Symptoms are worse during pollen season. He is wondering if he needs to go back on prednisone. [name] recommended resuming prednisone 10mg  and tapering to lowest dose he can tolerate over several weeks. He was previously on 5mg  daily for 4 months. Reports that his breathing was better while on prednisone. He is compliant with Dulera and Spiriva. He is using both Albuterol nebulizer and hfa 2-3 times daily. He uses dymista nasal spray, no over the counter antihistamine. He was started on Daliresp 500mg  daily by [place] with Torsemide. Recently started on Entresto by Dr. Haroldine Laws.  Observations/Objective:  - Able to speak in full sentences, no shortness of breath, wheezing or cough  Assessment and  Plan:  COPD GOLD D - Increased shortness of breath/cough with associated allergic rhinitis - Last exacerbation in mid-March treated with Levaquin/pred taper - Started on Daliresp in March by [facility] - Continue Dulera 200 and Spiriva - Resume daily low dose prednisone 10mg  prednisone x 2 weeks; then 5mg  daily (goal to arrive to lowest dose possible)  Seasonal allergic rhinitis: - Continue Dymista nasal spray - Start Singulair 10mg  qhs   Follow Up Instructions:  - FU in 4 weeks with App   I discussed the assessment and treatment plan with the patient. The patient was provided an opportunity to ask questions and all were answered. The patient agreed with the plan and demonstrated an understanding of the instructions.   The patient was advised to call back or seek an in-person evaluation if the symptoms worsen or if the condition fails to improve as anticipated.  I provided 22 minutes of non-face-to-face time during this encounter.   Martyn Ehrich, NP

## 2020-04-03 NOTE — Telephone Encounter (Signed)
Needs follow up in 4 weeks

## 2020-04-03 NOTE — Telephone Encounter (Signed)
Called pt and advised message from the provider. Pt understood and verbalized understanding. Nothing further is needed.   Appt made today with Derl Barrow.

## 2020-04-05 NOTE — Telephone Encounter (Signed)
Already a recall placed in May with for RB. Nothing further is needed.

## 2020-04-12 DIAGNOSIS — M25562 Pain in left knee: Secondary | ICD-10-CM | POA: Insufficient documentation

## 2020-04-19 ENCOUNTER — Ambulatory Visit (INDEPENDENT_AMBULATORY_CARE_PROVIDER_SITE_OTHER): Payer: BC Managed Care – PPO | Admitting: Cardiovascular Disease

## 2020-04-19 ENCOUNTER — Encounter: Payer: Self-pay | Admitting: Cardiovascular Disease

## 2020-04-19 ENCOUNTER — Other Ambulatory Visit: Payer: Self-pay

## 2020-04-19 DIAGNOSIS — I10 Essential (primary) hypertension: Secondary | ICD-10-CM | POA: Diagnosis not present

## 2020-04-19 DIAGNOSIS — I428 Other cardiomyopathies: Secondary | ICD-10-CM | POA: Diagnosis not present

## 2020-04-19 DIAGNOSIS — G4733 Obstructive sleep apnea (adult) (pediatric): Secondary | ICD-10-CM | POA: Diagnosis not present

## 2020-04-19 DIAGNOSIS — F17210 Nicotine dependence, cigarettes, uncomplicated: Secondary | ICD-10-CM

## 2020-04-19 DIAGNOSIS — R600 Localized edema: Secondary | ICD-10-CM

## 2020-04-19 NOTE — Assessment & Plan Note (Signed)
History of nonischemic cardiomyopathy probably related to hypertensive heart disease on low-dose Entresto with EF of 45% by last echo performed 11/30/2019.

## 2020-04-19 NOTE — Assessment & Plan Note (Signed)
History of essential hypertension a blood pressure measured today 146/98.  He does admit to medication noncompliance.  He was on a beta-blocker in the past which he is unable to take.  He is also on Entresto which she has not taken it in the last 2 days.

## 2020-04-19 NOTE — Patient Instructions (Signed)
Medication Instructions:  The current medical regimen is effective;  continue present plan and medications.  *If you need a refill on your cardiac medications before your next appointment, please call your pharmacy*    Follow-Up: At [facility], you and your health needs are our priority.  As part of our continuing mission to provide you with exceptional heart care, we have created designated Provider Care Teams.  These Care Teams include your primary Cardiologist (physician) and Advanced Practice Providers (APPs -  Physician Assistants and Nurse Practitioners) who all work together to provide you with the care you need, when you need it.  We recommend signing up for the patient portal called "MyChart".  Sign up information is provided on this After Visit Summary.  MyChart is used to connect with patients for Virtual Visits (Telemedicine).  Patients are able to view lab/test results, encounter notes, upcoming appointments, etc.  Non-urgent messages can be sent to your provider as well.   To learn more about what you can do with MyChart, go to [email or web address].    Your next appointment:   Follow up with APP (PA/NP) in 6 months Follow up with Dr.Berry in 12 months  Keep appointment with [facility] (sleep doctor) June 24th at 11:40.

## 2020-04-19 NOTE — Progress Notes (Signed)
04/19/2020 Robert Church   11/29/1956  [id number]  Primary Physician Geannie Risen, MD Primary Cardiologist: [name] MD Garret Reddish, [place], [place]  HPI:  Robert Church is a 64 y.o.  severely overweight divorced Caucasian male father of 1 grandchild who works in [place] [employer]. He was referred because of chest pain by Dr. Lisbeth Ply forcardiovascularevaluation.I last saw him in the  12/08/2019.His mother is at [employer] my long-term patients as well. He saw Dr. Johnsie Cancel in the past. His risk factors include ongoing tobacco abuse, hypertension and family history.Henever had a heart attack or stroke. He did have a heart catheterization performed by Dr. Tamala Julian on 04/19/2017 that showed normal coronary arteries with an EF of 40% consistent with a nonischemic cardia myopathy.A 2D echo performed 07/07/2018 showed an EF of 30 to 35%.  I referred him to Dr. Haroldine Laws in the advanced heart failure clinic who changed his carvedilol to Zebeta and his losartan to [facility]. Unfortunately, he did not tolerate the Entresto and he was changed back to losartan.Hedoes have obstructive sleep apnea on CPAP which he wears intermittently. He continues to exhibit class II/III symptoms.  Since I saw him4 months ago he is complained of joint achiness which he thinks is related to prednisone withdrawal.  He does continue to smoke a half a pack a day.  He does admit to medication noncompliance as well.  He has yet to be fit fitted with his CPAP machine for positive sleep study.  Current Meds  Medication Sig  . albuterol (PROVENTIL) (2.5 MG/3ML) 0.083% nebulizer solution Take 3 mLs (2.5 mg total) by nebulization every 6 (six) hours as needed for wheezing or shortness of breath.  Marland Kitchen albuterol (VENTOLIN HFA) 108 (90 Base) MCG/ACT inhaler Inhale 2 puffs into the lungs every 6 (six) hours as needed for wheezing.  Marland Kitchen ALPRAZolam (XANAX) 1 MG tablet Take 0.5-2 mg by mouth See admin  instructions. 0.5 mg in the morning and afternoon and take 2mg  at bedtime  . Aspirin-Salicylamide-Caffeine (BC FAST PAIN RELIEF) 650-195-33.3 MG PACK Take 1 packet by mouth every 8 (eight) hours as needed (for knee pain.).  . Azelastine-Fluticasone 137-50 MCG/ACT SUSP 1 spray each nostril twice daily  . cholecalciferol (VITAMIN D) 1000 units tablet Take 1,000 Units by mouth daily.  . mometasone-formoterol (DULERA) 200-5 MCG/ACT AERO Take 2 puffs first thing in am and then another 2 puffs about 12 hours later.  . montelukast (SINGULAIR) 10 MG tablet Take 1 tablet (10 mg total) by mouth at bedtime.  Marland Kitchen omeprazole (PRILOSEC) 20 MG capsule Take 20 mg by mouth 2 (two) times daily. ACID REFLUX  . predniSONE (DELTASONE) 10 MG tablet Take 1 tablet (10 mg total) by mouth daily for 14 days, THEN 0.5 tablets (5 mg total) daily for 21 days.  . roflumilast (DALIRESP) 500 MCG TABS tablet Take 500 mcg by mouth daily.  . sacubitril-valsartan (ENTRESTO) 24-26 MG Take 1 tablet by mouth 2 (two) times daily.  Marland Kitchen spironolactone (ALDACTONE) 50 MG tablet Take 1 tablet (50 mg total) by mouth at bedtime.  . Testosterone 30 MG/ACT SOLN Apply 1 application topically daily. Apply under each arm daily  . Tiotropium Bromide Monohydrate (SPIRIVA RESPIMAT) 2.5 MCG/ACT AERS Inhale 2.5 mcg into the lungs 2 (two) times daily.  Marland Kitchen torsemide (DEMADEX) 20 MG tablet Take 2 tablets (40 mg total) by mouth daily.     Allergies  Allergen Reactions  . Clindamycin Other (See Comments)    Tongue swelling  .  Doxycycline     Severe stomach upset per patient  . E-Mycin [Erythromycin Base] Swelling  . Penicillins Swelling and Other (See Comments)    Childhood rxn--MD stated he "almost died" Has patient had a PCN reaction causing immediate rash, facial/tongue/throat swelling, SOB or lightheadedness with hypotension:Yes Has patient had a PCN reaction causing severe rash involving mucus membranes or skin necrosis:unsure Has patient had a PCN  reaction that required hospitalization:unsure Has patient had a PCN reaction occurring within the last 10 years:No If all of the above answers are "NO", then may proceed with Cephalosporin use.    . Rosuvastatin     Other reaction(s): Myalgias (intolerance)    Social History   Socioeconomic History  . Marital status: Single    Spouse name: Not on file  . Number of children: Not on file  . Years of education: Not on file  . Highest education level: Not on file  Occupational History  . Not on file  Tobacco Use  . Smoking status: Current Every Day Smoker    Packs/day: 0.50    Years: 46.00    Pack years: 23.00    Types: Cigarettes  . Smokeless tobacco: Never Used  Substance and Sexual Activity  . Alcohol use: No    Alcohol/week: 0.0 standard drinks  . Drug use: No  . Sexual activity: Yes    Birth control/protection: None  Other Topics Concern  . Not on file  Social History Narrative  . Not on file   Social Determinants of Health   Financial Resource Strain:   . Difficulty of Paying Living Expenses:   Food Insecurity:   . Worried About Charity fundraiser in the Last Year:   . Arboriculturist in the Last Year:   Transportation Needs:   . Film/video editor (Medical):   Marland Kitchen Lack of Transportation (Non-Medical):   Physical Activity:   . Days of Exercise per Week:   . Minutes of Exercise per Session:   Stress:   . Feeling of Stress :   Social Connections:   . Frequency of Communication with Friends and Family:   . Frequency of Social Gatherings with Friends and Family:   . Attends Religious Services:   . Active Member of Clubs or Organizations:   . Attends Archivist Meetings:   Marland Kitchen Marital Status:   Intimate Partner Violence:   . Fear of Current or Ex-Partner:   . Emotionally Abused:   Marland Kitchen Physically Abused:   . Sexually Abused:      Review of Systems: General: negative for chills, fever, night sweats or weight changes.  Cardiovascular: negative  for chest pain, dyspnea on exertion, edema, orthopnea, palpitations, paroxysmal nocturnal dyspnea or shortness of breath Dermatological: negative for rash Respiratory: negative for cough or wheezing Urologic: negative for hematuria Abdominal: negative for nausea, vomiting, diarrhea, bright red blood per rectum, melena, or hematemesis Neurologic: negative for visual changes, syncope, or dizziness All other systems reviewed and are otherwise negative except as noted above.    Blood pressure (!) 146/98, pulse 94, height 5\' 10"  (1.778 m), weight 232 lb 9.6 oz (105.5 kg), SpO2 90 %.  General appearance: alert and no distress Neck: no adenopathy, no carotid bruit, no JVD, supple, symmetrical, trachea midline and thyroid not enlarged, symmetric, no tenderness/mass/nodules Lungs: clear to auscultation bilaterally Heart: regular rate and rhythm, S1, S2 normal, no murmur, click, rub or gallop Extremities: 2+ pedal edema Pulses: 2+ and symmetric Skin: Venous stasis changes Neurologic:  Alert and oriented X 3, normal strength and tone. Normal symmetric reflexes. Normal coordination and gait  EKG not performed today  ASSESSMENT AND PLAN:   HTN (hypertension) History of essential hypertension a blood pressure measured today 146/98.  He does admit to medication noncompliance.  He was on a beta-blocker in the past which he is unable to take.  He is also on Entresto which she has not taken it in the last 2 days.  Sleep apnea History of absent obstructive sleep apnea positive sleep study being set up for CPAP  Cigarette smoker History of ongoing tobacco abuse recalcitrant to risk factor modification.  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  Edema of both legs Bilateral lower extremity edema probably related to diastolic dysfunction on  diuretics.  Is aware of salt avoidance      [name] MD [facility], [facility] 04/19/2020 11:55 AM

## 2020-04-19 NOTE — Assessment & Plan Note (Signed)
Bilateral lower extremity edema probably related to diastolic dysfunction on diuretics.  Is aware of salt avoidance

## 2020-04-19 NOTE — Assessment & Plan Note (Addendum)
History of ongoing tobacco abuse recalcitrant to risk factor modification. 

## 2020-04-19 NOTE — Assessment & Plan Note (Signed)
History of absent obstructive sleep apnea positive sleep study being set up for CPAP

## 2020-04-22 ENCOUNTER — Telehealth: Payer: Self-pay | Admitting: Cardiovascular Disease

## 2020-04-22 NOTE — Telephone Encounter (Signed)
Kim from [employer] states that they only got 5 of the 9 pages from the patients Robert Church on 04/19/20 with Dr. Gwenlyn Found. She states that they did not get pages 1-4. Fax number: [phone]

## 2020-05-17 ENCOUNTER — Other Ambulatory Visit: Payer: Self-pay | Admitting: *Deleted

## 2020-05-17 DIAGNOSIS — R600 Localized edema: Secondary | ICD-10-CM

## 2020-05-22 ENCOUNTER — Encounter: Payer: Self-pay | Admitting: Vascular Surgery

## 2020-05-22 ENCOUNTER — Other Ambulatory Visit: Payer: Self-pay

## 2020-05-22 ENCOUNTER — Ambulatory Visit (INDEPENDENT_AMBULATORY_CARE_PROVIDER_SITE_OTHER): Payer: 59 | Admitting: Vascular Surgery

## 2020-05-22 ENCOUNTER — Ambulatory Visit (HOSPITAL_COMMUNITY)
Admission: RE | Admit: 2020-05-22 | Discharge: 2020-05-22 | Disposition: A | Discharge status: Home / Self Care | Payer: 59 | Source: Ambulatory Visit | Attending: Vascular Surgery | Admitting: Vascular Surgery

## 2020-05-22 VITALS — BP 169/100 | HR 81 | Temp 97.8°F | Resp 20 | Ht 70.0 in | Wt 233.0 lb

## 2020-05-22 DIAGNOSIS — I872 Venous insufficiency (chronic) (peripheral): Secondary | ICD-10-CM

## 2020-05-22 DIAGNOSIS — R600 Localized edema: Secondary | ICD-10-CM | POA: Insufficient documentation

## 2020-05-22 NOTE — Progress Notes (Signed)
REASON FOR CONSULT:    Bilateral lower extremity pain and swelling.  The consult is requested by Dr. Vickki Hearing.  ASSESSMENT & PLAN:   CHRONIC VENOUS INSUFFICIENCY: Based on his exam and his symptoms I think he does have underlying chronic venous insufficiency.  He has evidence of CEAP C4 venous disease.  He did not however have formal venous testing today.  I discussed with him the importance of intermittent leg elevation and the proper positioning for this.  I encouraged him to get some knee-high compression stockings with a gradient of 15 to 20 mmHg.  I have encouraged him to avoid prolonged sitting and standing.  We discussed the importance of exercise specifically walking and water aerobics.  We also discussed the importance of maintaining a healthy weight a central obesity especially increases lower extremity venous pressure.  If his symptoms progress then I think he should have formal venous reflux testing bilaterally.  He will call if his symptoms progress.  I reassured him that he has no evidence of arterial insufficiency.  Some of the symptoms in his feet, the burning and tingling could potentially be secondary to neuropathy although he has no history of diabetes.  He will follow up with his primary care physician if the symptoms worsen.  I did discuss with him the importance of tobacco cessation.  Deitra Mayo, MD Office: [phone]   HPI:   Robert Church is a pleasant 64 y.o. male, who was referred with bilateral lower extremity pain and swelling.  On my history he states that he has had swelling in both lower extremities for the last 5 to 6 months.  He has been treated with diuretics which helps some.  He does have a history of combined systolic and diastolic congestive heart failure and is followed by Dr. Quay Burow.  I do not get any clear-cut history of claudication, rest pain, or nonhealing ulcers.  He describes aching pain and heaviness in his legs which is aggravated  by standing.  He describes some tingling and burning in his feet.  His risk factors for peripheral vascular disease include a history of hypertension and tobacco use.  He smokes a half a pack per day and is been smoking since he was 64 years old.  He denies any history of diabetes, hypercholesterolemia, or family history of premature cardiovascular disease.  He denies any previous history of DVT or phlebitis.  He has had no previous venous procedures.  Past Medical History:  Diagnosis Date  . Allergy   . Asthma   . Bronchitis   . COPD (chronic obstructive pulmonary disease) ([place])   . GERD (gastroesophageal reflux disease)   . Hypertension   . Pneumonia   . Sinusitis   . SOB (shortness of breath)     Family History  Problem Relation Age of Onset  . Lung cancer Father   . Brain cancer Father     SOCIAL HISTORY: Social History   Socioeconomic History  . Marital status: Single    Spouse name: Not on file  . Number of children: Not on file  . Years of education: Not on file  . Highest education level: Not on file  Occupational History  . Occupation: Dealer  Tobacco Use  . Smoking status: Current Every Day Smoker    Packs/day: 0.50    Years: 46.00    Pack years: 23.00    Types: Cigarettes  . Smokeless tobacco: Never Used  Substance and Sexual Activity  . Alcohol  use: No    Alcohol/week: 0.0 standard drinks  . Drug use: No  . Sexual activity: Yes    Birth control/protection: None  Other Topics Concern  . Not on file  Social History Narrative  . Not on file   Social Determinants of Health   Financial Resource Strain:   . Difficulty of Paying Living Expenses:   Food Insecurity:   . Worried About Charity fundraiser in the Last Year:   . Arboriculturist in the Last Year:   Transportation Needs:   . Film/video editor (Medical):   Marland Kitchen Lack of Transportation (Non-Medical):   Physical Activity:   . Days of Exercise per Week:   . Minutes of Exercise per  Session:   Stress:   . Feeling of Stress :   Social Connections:   . Frequency of Communication with Friends and Family:   . Frequency of Social Gatherings with Friends and Family:   . Attends Religious Services:   . Active Member of Clubs or Organizations:   . Attends Archivist Meetings:   Marland Kitchen Marital Status:   Intimate Partner Violence:   . Fear of Current or Ex-Partner:   . Emotionally Abused:   Marland Kitchen Physically Abused:   . Sexually Abused:     Allergies  Allergen Reactions  . Clindamycin Other (See Comments)    Tongue swelling  . Doxycycline     Severe stomach upset per patient  . E-Mycin [Erythromycin Base] Swelling  . Penicillins Swelling and Other (See Comments)    Childhood rxn--MD stated he "almost died" Has patient had a PCN reaction causing immediate rash, facial/tongue/throat swelling, SOB or lightheadedness with hypotension:Yes Has patient had a PCN reaction causing severe rash involving mucus membranes or skin necrosis:unsure Has patient had a PCN reaction that required hospitalization:unsure Has patient had a PCN reaction occurring within the last 10 years:No If all of the above answers are "NO", then may proceed with Cephalosporin use.    . Rosuvastatin     Other reaction(s): Myalgias (intolerance)    Current Outpatient Medications  Medication Sig Dispense Refill  . albuterol (PROVENTIL) (2.5 MG/3ML) 0.083% nebulizer solution Take 3 mLs (2.5 mg total) by nebulization every 6 (six) hours as needed for wheezing or shortness of breath. 75 mL 12  . albuterol (VENTOLIN HFA) 108 (90 Base) MCG/ACT inhaler Inhale 2 puffs into the lungs every 6 (six) hours as needed for wheezing. 18 g 4  . ALPRAZolam (XANAX) 1 MG tablet Take 0.5-2 mg by mouth See admin instructions. 0.5 mg in the morning and afternoon and take 2mg  at bedtime    . Aspirin-Salicylamide-Caffeine (BC FAST PAIN RELIEF) 650-195-33.3 MG PACK Take 1 packet by mouth every 8 (eight) hours as needed (for  knee pain.).    . Azelastine-Fluticasone 137-50 MCG/ACT SUSP 1 spray each nostril twice daily 23 g 6  . cholecalciferol (VITAMIN D) 1000 units tablet Take 1,000 Units by mouth daily.    . mometasone-formoterol (DULERA) 200-5 MCG/ACT AERO Take 2 puffs first thing in am and then another 2 puffs about 12 hours later. 13 g 4  . montelukast (SINGULAIR) 10 MG tablet Take 1 tablet (10 mg total) by mouth at bedtime. 30 tablet 1  . omeprazole (PRILOSEC) 20 MG capsule Take 20 mg by mouth 2 (two) times daily. ACID REFLUX    . roflumilast (DALIRESP) 500 MCG TABS tablet Take 500 mcg by mouth daily.    . sacubitril-valsartan (ENTRESTO) 24-26 MG Take 1  tablet by mouth 2 (two) times daily. 60 tablet 3  . spironolactone (ALDACTONE) 50 MG tablet Take 1 tablet (50 mg total) by mouth at bedtime. 30 tablet 6  . Testosterone 30 MG/ACT SOLN Apply 1 application topically daily. Apply under each arm daily    . Tiotropium Bromide Monohydrate (SPIRIVA RESPIMAT) 2.5 MCG/ACT AERS Inhale 2.5 mcg into the lungs 2 (two) times daily. 4 g 4  . torsemide (DEMADEX) 20 MG tablet Take 2 tablets (40 mg total) by mouth daily. 180 tablet 3   No current facility-administered medications for this visit.    REVIEW OF SYSTEMS:  [X]  denotes positive finding, [ ]  denotes negative finding Cardiac  Comments:  Chest pain or chest pressure: x   Shortness of breath upon exertion: x   Short of breath when lying flat: x   Irregular heart rhythm:        Vascular    Pain in calf, thigh, or hip brought on by ambulation: x   Pain in feet at night that wakes you up from your sleep:  x   Blood clot in your veins:    Leg swelling:  x       Pulmonary    Oxygen at home:    Productive cough:  x   Wheezing:  x       Neurologic    Sudden weakness in arms or legs:     Sudden numbness in arms or legs:     Sudden onset of difficulty speaking or slurred speech:    Temporary loss of vision in one eye:     Problems with dizziness:          Gastrointestinal    Blood in stool:     Vomited blood:         Genitourinary    Burning when urinating:     Blood in urine:        Psychiatric    Major depression:         Hematologic    Bleeding problems:    Problems with blood clotting too easily:        Skin    Rashes or ulcers:        Constitutional    Fever or chills:     PHYSICAL EXAM:   Vitals:   05/22/20 1339  BP: (!) 169/100  Pulse: 81  Resp: 20  Temp: 97.8 F (36.6 C)  SpO2: 92%  Weight: 233 lb (105.7 kg)  Height: 5\' 10"  (1.778 m)    GENERAL: The patient is a well-nourished male, in no acute distress. The vital signs are documented above. CARDIAC: There is a regular rate and rhythm.  VASCULAR: I do not detect carotid bruits. On the right side he has a palpable femoral, dorsalis pedis, posterior tibial pulse. On the left side he has a palpable femoral, and dorsalis pedis pulse. He has bilateral lower extremity swelling and hyperpigmentation bilaterally.    PULMONARY: There is good air exchange bilaterally without wheezing or rales. ABDOMEN: Soft and non-tender with normal pitched bowel sounds.  MUSCULOSKELETAL: There are no major deformities or cyanosis. NEUROLOGIC: No focal weakness or paresthesias are detected. SKIN: There are no ulcers or rashes noted. PSYCHIATRIC: The patient has a normal affect.  DATA:    ARTERIAL DOPPLER STUDY: I have independently interpreted his arterial Doppler study today.  On the right side there is a triphasic dorsalis pedis and posterior tibial waveform.  ABI is 100%.  Toe pressures 167 mmHg.  On the left side there is a triphasic dorsalis pedis and posterior tibial signal.  ABI is 100%.  Toe pressures 124 mmHg.

## 2020-06-12 ENCOUNTER — Encounter: Payer: Self-pay | Admitting: Emergency Medicine

## 2020-06-12 ENCOUNTER — Ambulatory Visit (INDEPENDENT_AMBULATORY_CARE_PROVIDER_SITE_OTHER): Payer: 59 | Admitting: Emergency Medicine

## 2020-06-12 ENCOUNTER — Other Ambulatory Visit: Payer: Self-pay

## 2020-06-12 DIAGNOSIS — F17210 Nicotine dependence, cigarettes, uncomplicated: Secondary | ICD-10-CM

## 2020-06-12 DIAGNOSIS — J449 Chronic obstructive pulmonary disease, unspecified: Secondary | ICD-10-CM

## 2020-06-12 MED ORDER — ROFLUMILAST 500 MCG PO TABS: 500.0000 ug | ORAL_TABLET | Freq: Every day | ORAL | 3 refills | Status: DC

## 2020-06-12 NOTE — Assessment & Plan Note (Signed)
Continues to smoke.  Talked about this in detail today.  He is not ready to set a quit date.  You need to work on stopping smoking altogether.  This will benefit your breathing and your overall health significantly. We will plan for a repeat lung cancer screening CT scan of the chest in March 2022 (most recent was at [facility] 03/13/2020)

## 2020-06-12 NOTE — Assessment & Plan Note (Signed)
Multiple symptoms and overall worse when he tried to come off prednisone.  He is now back to 5 mg daily and has stabilized.  Still with daily symptoms, dyspnea, frequent albuterol use.  He continues to smoke.  He was being seen at [facility] for period of time, was started on Daliresp there.  He believes that has benefited and we will continue it.    Please continue Spiriva and Dulera as you have been taking them.  Rinse and gargle after the [facility]. Continue prednisone 5 mg once daily Keep albuterol available to use either 1 nebulizer treatment or 2 puffs up to every 4 hours when you need it for shortness of breath, chest tightness, wheezing. He would benefit from getting the COVID-19 vaccine.  Please consider getting this when you are comfortable doing so. We will reorder your Daliresp to your pharmacy Follow with Dr Lamonte Sakai in 4 months or sooner if you have any problems.

## 2020-06-12 NOTE — Patient Instructions (Addendum)
Please continue Spiriva and Dulera as you have been taking them.  Rinse and gargle after the [facility]. Continue prednisone 5 mg once daily Keep albuterol available to use either 1 nebulizer treatment or 2 puffs up to every 4 hours when you need it for shortness of breath, chest tightness, wheezing. He would benefit from getting the COVID-19 vaccine.  Please consider getting this when you are comfortable doing so. We will reorder your Daliresp to your pharmacy You need to work on stopping smoking altogether.  This will benefit your breathing and your overall health significantly. We will plan for a repeat lung cancer screening CT scan of the chest in March 2022 (most recent was at [facility] 03/13/2020) Follow with Dr Lamonte Sakai in 4 months or sooner if you have any problems.

## 2020-06-12 NOTE — Progress Notes (Signed)
Patient her to reestablish care  Subjective:  Patient ID: Robert Church, male    DOB: [birth year]  Age: 64 y.o. MRN: [id number]  CC:  Chief Complaint  Patient presents with  . 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  He has been on chronic steroids, had decreased to as low as 3 mg but had increased symptoms so at the end of December I treated briefly with 10 mg and then wean down to 5 mg. Then he started weaning himself >> has cut down to about 1mg  a day (1/4 of a 5mg  pill). He has a lot of mucous to clear in the am. He has some nocturnal awakenings in the night. Clear mucous. He has wheeze at night when he lays down. On Dulera and Spiriva, uses albuterol about tid - not always helpful.   He continues to smoke approximately .  Also followed for systolic CHF, NICM.  Recently changed from furosemide to torsemide > swelling is better.   ROV 06/12/20 --63-year active smoker with severe COPD and chronic bronchitis last seen by me 02/07/2020, Seen by B Volanda Napoleon 04/03/2020.  He has been weaning chronic steroids and was down to as low as 1 mg daily in February. He is currently on 5mg  daily. He benefited from restarting the pred - had been experiencing increased dyspnea, mucous production, lots of joint pain and stiffness. He is improved, still has mucous at night, clear.   Otherwise managed on Dulera, Spiriva, albuterol as needed which he uses about 3-4x a day.  He started Singulair in April, has been on Dymista. He is out of daliresp, needs refill on this. Still smoking 4 cig a day.  Has not had COVID vaccine - does not want to do so at this time.  LDCT [facility]) 03/13/20 >> 9x68mm nodule RLL stable since 11/2017, stable 23mm LLL nodule. Plan repeat 02/2021.   MDM: Reviewed CT chest 03/13/2020 Reviewed office  notes 04/03/2020 Reviewed cardiology note from 04/19/2020   ROS Review of Systems  Constitutional: Negative for activity change, fatigue, fever and unexpected weight change.  HENT: Negative for congestion, postnasal drip, rhinorrhea and sinus pain.   Eyes: Negative for redness.  Respiratory: Positive for apnea, cough, shortness of breath and wheezing. Negative for choking, chest tightness and stridor.   Cardiovascular: Positive for leg swelling. Negative for chest pain and palpitations.  Gastrointestinal: Positive for abdominal distention. Negative for abdominal pain, diarrhea and nausea.  Endocrine: Negative for polyuria.  Genitourinary: Negative for dysuria and hematuria.  Musculoskeletal: Negative for back pain, gait problem and myalgias.  Skin: Negative for rash and wound.  Neurological: Negative for dizziness, seizures, weakness and headaches.  Psychiatric/Behavioral: Negative for agitation and self-injury.    Objective:   Today's Vitals: BP 130/78 (BP Location: Right Arm, Cuff Size: Normal)   Pulse 90   Temp 97.8 F (36.6 C) (Oral)   Ht 5\' 10"  (1.778 m)   Wt 234 lb 12.8 oz (106.5 kg)   SpO2 98%   BMI 33.69 kg/m   Gen: Pleasant, well-nourished, in no distress,  normal affect  ENT: No lesions,  mouth clear,  oropharynx clear, no postnasal drip  Neck: No JVD, no stridor  Lungs: No use of accessory muscles, no crackles, he does have some exp wheeze, low pitched.  Cardiovascular: RRR, heart sounds normal, no murmur or gallops, no peripheral edema  Musculoskeletal: No deformities, no cyanosis or clubbing  Neuro: alert, awake, non focal  Skin: Warm, no lesions or rash   Assessment & Plan:  COPD, group D, by GOLD 2017 classification ([place]) Multiple symptoms and overall worse when he tried to come off prednisone.  He is now back to 5 mg daily and has stabilized.  Still with daily symptoms, dyspnea, frequent albuterol use.  He continues to smoke.  He was being seen at  [facility] for period of time, was started on Daliresp there.  He believes that has benefited and we will continue it.    Please continue Spiriva and Dulera as you have been taking them.  Rinse and gargle after the [facility]. Continue prednisone 5 mg once daily Keep albuterol available to use either 1 nebulizer treatment or 2 puffs up to every 4 hours when you need it for shortness of breath, chest tightness, wheezing. He would benefit from getting the COVID-19 vaccine.  Please consider getting this when you are comfortable doing so. We will reorder your Daliresp to your pharmacy Follow with Dr Lamonte Sakai in 4 months or sooner if you have any problems.  Cigarette smoker Continues to smoke.  Talked about this in detail today.  He is not ready to set a quit date.  You need to work on stopping smoking altogether.  This will benefit your breathing and your overall health significantly. We will plan for a repeat lung cancer screening CT scan of the chest in March 2022 (most recent was at [facility] 03/13/2020)    Baltazar Apo, MD, PhD 06/12/2020, 12:29 PM [facility] [phone] or if no answer [phone]

## 2020-06-13 ENCOUNTER — Ambulatory Visit: Payer: 59 | Admitting: Cardiovascular Disease

## 2020-06-14 ENCOUNTER — Telehealth: Payer: Self-pay | Admitting: Cardiovascular Disease

## 2020-06-14 ENCOUNTER — Telehealth: Payer: Self-pay | Admitting: Emergency Medicine

## 2020-06-14 MED ORDER — ALBUTEROL SULFATE HFA 108 (90 BASE) MCG/ACT IN AERS: 2.0000 | INHALATION_SPRAY | Freq: Four times a day (QID) | RESPIRATORY_TRACT | 5 refills | Status: DC | PRN

## 2020-06-14 MED ORDER — LOSARTAN POTASSIUM 100 MG PO TABS
100.0000 mg | ORAL_TABLET | Freq: Every day | ORAL | 3 refills | Status: DC
Start: 2020-06-14 — End: 2020-09-18

## 2020-06-14 MED ORDER — SPIRIVA RESPIMAT 2.5 MCG/ACT IN AERS: 2.5000 ug | INHALATION_SPRAY | Freq: Two times a day (BID) | RESPIRATORY_TRACT | 5 refills | Status: DC

## 2020-06-14 MED ORDER — DULERA 200-5 MCG/ACT IN AERO: INHALATION_SPRAY | RESPIRATORY_TRACT | 5 refills | Status: DC

## 2020-06-14 NOTE — Telephone Encounter (Signed)
Spoke with pt, his blood pressure has not been down since taking the entresto. Aware that is a medication that is titrated up. He reports due to loosing his job he will not be able to afford it so he prefers to go back to losartan. New script sent to the pharmacy

## 2020-06-14 NOTE — Telephone Encounter (Signed)
Patient called requesting refill on Dulera, Albuterol inhaler, and Spiriva Respimat. Refills sent to preferred pharmacy at [facility].

## 2020-06-14 NOTE — Telephone Encounter (Signed)
New Message:   Pt said he stopped taking the Entresto. He says it was not working. He wants to go back on the Losartan. He would like for you to call in the Losartan to his new pharmacy( [facility])please.

## 2020-06-25 ENCOUNTER — Telehealth: Payer: Self-pay | Admitting: General Practice

## 2020-06-25 NOTE — Telephone Encounter (Signed)
Patient is requesting to become a new patient of yours, He states Dr. Laurance Flatten recommended you to him.   Please advise, Thank you.

## 2020-06-25 NOTE — Telephone Encounter (Signed)
Appointment has been made for 7/19 and new patient paperwork has been mailed to patient.

## 2020-06-26 ENCOUNTER — Ambulatory Visit: Payer: Self-pay | Admitting: Registered Nurse

## 2020-07-08 ENCOUNTER — Encounter: Payer: Self-pay | Admitting: Internal Medicine

## 2020-07-08 ENCOUNTER — Ambulatory Visit (INDEPENDENT_AMBULATORY_CARE_PROVIDER_SITE_OTHER): Payer: 59 | Admitting: Internal Medicine

## 2020-07-08 ENCOUNTER — Other Ambulatory Visit: Payer: Self-pay

## 2020-07-08 VITALS — BP 164/88 | HR 58 | Temp 98.2°F | Ht 67.3 in | Wt 241.0 lb

## 2020-07-08 DIAGNOSIS — N6320 Unspecified lump in the left breast, unspecified quadrant: Secondary | ICD-10-CM

## 2020-07-08 DIAGNOSIS — E291 Testicular hypofunction: Secondary | ICD-10-CM | POA: Diagnosis not present

## 2020-07-08 DIAGNOSIS — Z0000 Encounter for general adult medical examination without abnormal findings: Secondary | ICD-10-CM | POA: Diagnosis not present

## 2020-07-08 DIAGNOSIS — K429 Umbilical hernia without obstruction or gangrene: Secondary | ICD-10-CM | POA: Insufficient documentation

## 2020-07-08 DIAGNOSIS — R739 Hyperglycemia, unspecified: Secondary | ICD-10-CM | POA: Diagnosis not present

## 2020-07-08 DIAGNOSIS — I10 Essential (primary) hypertension: Secondary | ICD-10-CM | POA: Diagnosis not present

## 2020-07-08 DIAGNOSIS — E785 Hyperlipidemia, unspecified: Secondary | ICD-10-CM

## 2020-07-08 DIAGNOSIS — E781 Pure hyperglyceridemia: Secondary | ICD-10-CM | POA: Diagnosis not present

## 2020-07-08 NOTE — Progress Notes (Signed)
Subjective:  Patient ID: Robert Church, male    DOB: 1956-02-26  Age: 64 y.o. MRN: [id number]  CC: Annual Exam  NEW TO ME  HPI Robert Church presents for a CPX.  He complains that over the last few weeks he has noticed a painful nodule around his left nipple.  He also complains that he has an umbilical hernia that has been worsening over the last few months. He denies abdominal pain, nausea, vomiting, loss of appetite, weight loss, diarrhea, or constipation.  He has a history of nonischemic cardiomyopathy with a mildly depressed ejection fraction of around 45%. He complains of chronic shortness of breath but denies any recent episodes of chest pain, diaphoresis, palpitations, or near syncope. He tells me he is not always compliant with the loop diuretic.  History Robert Church has a past medical history of Allergy, Asthma, Bronchitis, COPD (chronic obstructive pulmonary disease) ([place]), GERD (gastroesophageal reflux disease), Hypertension, Pneumonia, Sinusitis, and SOB (shortness of breath).   He has a past surgical history that includes Tonsillectomy and LEFT HEART CATH AND CORONARY ANGIOGRAPHY (N/A, 04/19/2017).   His family history includes Brain cancer in his father; Lung cancer in his father.He reports that he has been smoking cigarettes. He has a 11.50 pack-year smoking history. He has never used smokeless tobacco. He reports current alcohol use. He reports that he does not use drugs.  Outpatient Medications Prior to Visit  Medication Sig Dispense Refill  . albuterol (PROVENTIL) (2.5 MG/3ML) 0.083% nebulizer solution Take 3 mLs (2.5 mg total) by nebulization every 6 (six) hours as needed for wheezing or shortness of breath. 75 mL 12  . albuterol (VENTOLIN HFA) 108 (90 Base) MCG/ACT inhaler Inhale 2 puffs into the lungs every 6 (six) hours as needed for wheezing. 18 g 5  . ALPRAZolam (XANAX) 1 MG tablet Take 0.5-2 mg by mouth See admin instructions. 0.5 mg in the morning and afternoon and  take 2mg  at bedtime    . Azelastine-Fluticasone 137-50 MCG/ACT SUSP 1 spray each nostril twice daily 23 g 6  . cholecalciferol (VITAMIN D) 1000 units tablet Take 1,000 Units by mouth daily.    Marland Kitchen losartan (COZAAR) 100 MG tablet Take 1 tablet (100 mg total) by mouth daily. 90 tablet 3  . mometasone-formoterol (DULERA) 200-5 MCG/ACT AERO Take 2 puffs first thing in am and then another 2 puffs about 12 hours later. 13 g 5  . montelukast (SINGULAIR) 10 MG tablet Take 1 tablet (10 mg total) by mouth at bedtime. 30 tablet 1  . omeprazole (PRILOSEC) 20 MG capsule Take 20 mg by mouth 2 (two) times daily. ACID REFLUX    . predniSONE (DELTASONE) 10 MG tablet Take 10 mg by mouth daily with breakfast. Pt taking 1/2 tablet    . roflumilast (DALIRESP) 500 MCG TABS tablet Take 1 tablet (500 mcg total) by mouth daily. 30 tablet 3  . Testosterone 30 MG/ACT SOLN Apply 1 application topically daily. Apply under each arm daily    . Tiotropium Bromide Monohydrate (SPIRIVA RESPIMAT) 2.5 MCG/ACT AERS Inhale 2.5 mcg into the lungs 2 (two) times daily. 4 g 5  . torsemide (DEMADEX) 20 MG tablet Take 2 tablets (40 mg total) by mouth daily. 180 tablet 3  . Aspirin-Salicylamide-Caffeine (BC FAST PAIN RELIEF) 650-195-33.3 MG PACK Take 1 packet by mouth every 8 (eight) hours as needed (for knee pain.).    Marland Kitchen spironolactone (ALDACTONE) 50 MG tablet Take 1 tablet (50 mg total) by mouth at bedtime. 30 tablet 6  No facility-administered medications prior to visit.    ROS Review of Systems  Constitutional: Negative for appetite change, diaphoresis and unexpected weight change.  HENT: Negative.  Negative for sinus pressure and trouble swallowing.   Eyes: Negative.   Respiratory: Positive for shortness of breath. Negative for cough, chest tightness and wheezing.   Cardiovascular: Positive for leg swelling. Negative for chest pain and palpitations.  Gastrointestinal: Negative for abdominal pain, blood in stool, constipation,  diarrhea, nausea and vomiting.  Endocrine: Negative.   Genitourinary: Negative.  Negative for difficulty urinating and dysuria.  Musculoskeletal: Negative.  Negative for arthralgias and myalgias.  Skin: Negative.   Neurological: Negative.  Negative for dizziness, weakness and light-headedness.  Hematological: Negative for adenopathy. Does not bruise/bleed easily.  Psychiatric/Behavioral: Negative.     Objective:  BP (!) 164/88 (BP Location: Left Arm, Patient Position: Sitting, Cuff Size: Normal)   Pulse (!) 58   Temp 98.2 F (36.8 C) (Oral)   Ht 5' 7.3" (1.709 m)   Wt 241 lb (109.3 kg)   SpO2 92%   BMI 37.41 kg/m   Physical Exam Vitals reviewed.  Constitutional:      General: He is not in acute distress.    Appearance: He is obese. He is not toxic-appearing or diaphoretic.  HENT:     Nose: Nose normal.     Mouth/Throat:     Mouth: Mucous membranes are moist.  Eyes:     General: No scleral icterus.    Conjunctiva/sclera: Conjunctivae normal.  Cardiovascular:     Rate and Rhythm: Normal rate and regular rhythm.     Heart sounds: No murmur heard.   Pulmonary:     Breath sounds: No stridor. No wheezing, rhonchi or rales.  Chest:     Breasts: Breasts are asymmetrical.        Right: Normal.        Left: Mass present. No swelling, inverted nipple, skin change or tenderness.     Comments: There is a several centimeter palpable mass under the left areola. Abdominal:     General: Abdomen is protuberant. Bowel sounds are normal. There is no distension.     Palpations: Abdomen is soft. There is no hepatomegaly, splenomegaly or mass.     Tenderness: There is no abdominal tenderness.     Hernia: A hernia is present. Hernia is present in the umbilical area. There is no hernia in the ventral area or left femoral area.     Comments: There is a hard, tender, umbilical hernia that is not reducible  Genitourinary:    Comments: He was not willing to undress for a GU or rectal  examination. Musculoskeletal:        General: No swelling. Normal range of motion.     Cervical back: Neck supple.     Right lower leg: Edema (trace pitting) present.     Left lower leg: Edema (trace pitting) present.  Lymphadenopathy:     Cervical: No cervical adenopathy.     Upper Body:     Right upper body: No supraclavicular, axillary or pectoral adenopathy.     Left upper body: No supraclavicular, axillary or pectoral adenopathy.  Skin:    General: Skin is warm and dry.     Coloration: Skin is not pale.  Neurological:     General: No focal deficit present.     Mental Status: He is alert and oriented to person, place, and time. Mental status is at baseline.     Lab Results  Component Value Date   WBC 7.9 07/10/2020   HGB 18.2 (H) 07/10/2020   HCT 52.5 (H) 07/10/2020   PLT 181 07/10/2020   GLUCOSE 138 (H) 07/10/2020   CHOL 177 07/10/2020   TRIG 150 (H) 07/10/2020   HDL 43 07/10/2020   LDLCALC 108 (H) 07/10/2020   ALT 24 05/12/2016   AST 22 05/12/2016   NA 143 07/10/2020   K 3.9 07/10/2020   CL 100 07/10/2020   CREATININE 1.04 07/10/2020   BUN 18 07/10/2020   CO2 35 (H) 07/10/2020   TSH 0.61 07/10/2020   PSA 1.7 07/10/2020   INR 1.0 04/15/2017   HGBA1C 6.0 (H) 07/10/2020    Assessment & Plan:   Amahri was seen today for annual exam.  Diagnoses and all orders for this visit:  Essential hypertension- His blood pressure is not adequately well controlled. I recommended that he quit smoking and improve his lifestyle modifications. I have also asked him to be more compliant with the loop diuretic. -     CBC with Differential/Platelet; Future -     Basic metabolic panel; Future -     TSH; Future  Hypogonadism male- His testosterone level is in the low normal range. He has erythrocytosis which is likely multifactorial with a history of COPD, asthma, and tobacco abuse. We will continue the current testosterone supplement. -     Testosterone Total,Free,Bio, Males;  Future  Hypertriglyceridemia- His triglycerides are modestly elevated but do not require medical intervention.  Routine general medical examination at a health care facility- Limited exam completed, labs reviewed, he was not willing to get a pneumonia vaccine, he tells me that colon cancer screening is up-to-date, patient education material was given. -     Lipid panel; Future -     PSA; Future  Hyperglycemia- His A1c is at 6.0%. He is prediabetic. Medical therapy is not indicated. -     Hemoglobin A1c; Future  Mass of breast, left -     US BREAST ASPIRATION LEFT; Future -     MM Digital Diagnostic Bilat; Future  Umbilical hernia without obstruction and without gangrene -     Ambulatory referral to General Surgery  Hyperlipidemia LDL goal <70- He has an elevated ASCVD risk score and a history of noncompliance to rosuvastatin. I have asked him to try pitavastatin. -     Pitavastatin Calcium (LIVALO) 1 MG TABS; Take 1 tablet (1 mg total) by mouth daily.   I have discontinued Olene Craven. Wiegert's Aspirin-Salicylamide-Caffeine and spironolactone. I am also having him start on Livalo. Additionally, I am having him maintain his omeprazole, albuterol, Azelastine-Fluticasone, cholecalciferol, Testosterone, ALPRAZolam, torsemide, montelukast, predniSONE, roflumilast, losartan, Spiriva Respimat, Dulera, and albuterol.  Meds ordered this encounter  Medications  . Pitavastatin Calcium (LIVALO) 1 MG TABS    Sig: Take 1 tablet (1 mg total) by mouth daily.    Dispense:  90 tablet    Refill:  1   In addition to time spent on CPE, I spent 50 minutes in preparing to see the patient by review of recent labs, imaging and procedures, obtaining and reviewing separately obtained history, communicating with the patient and family or caregiver, ordering medications, tests or procedures, and documenting clinical information in the EHR including the differential Dx, treatment, and any further evaluation and other  management of 1. Essential hypertension 2. Hypogonadism male 3. Hypertriglyceridemia 4. Hyperglycemia 5. Mass of breast, left 6. Umbilical hernia without obstruction and without gangrene 7. Hyperlipidemia LDL goal <70  Follow-up: Return in about 2 months (around 09/08/2020).  Scarlette Calico, MD

## 2020-07-08 NOTE — Patient Instructions (Signed)

## 2020-07-10 ENCOUNTER — Other Ambulatory Visit: Payer: 59

## 2020-07-10 DIAGNOSIS — R739 Hyperglycemia, unspecified: Secondary | ICD-10-CM

## 2020-07-10 DIAGNOSIS — Z0000 Encounter for general adult medical examination without abnormal findings: Secondary | ICD-10-CM

## 2020-07-10 DIAGNOSIS — E291 Testicular hypofunction: Secondary | ICD-10-CM

## 2020-07-10 DIAGNOSIS — I10 Essential (primary) hypertension: Secondary | ICD-10-CM

## 2020-07-11 ENCOUNTER — Encounter: Payer: Self-pay | Admitting: Internal Medicine

## 2020-07-11 DIAGNOSIS — E785 Hyperlipidemia, unspecified: Secondary | ICD-10-CM | POA: Insufficient documentation

## 2020-07-11 LAB — LIPID PANEL
Cholesterol: 177 mg/dL (ref <200)
HDL: 43 mg/dL (ref >=40)
LDL Cholesterol (Calc): 108 mg/dL (calc) — ABNORMAL HIGH
Non-HDL Cholesterol (Calc): 134 mg/dL (calc) — ABNORMAL HIGH (ref <130)
Total CHOL/HDL Ratio: 4.1 (calc) (ref <5.0)
Triglycerides: 150 mg/dL — ABNORMAL HIGH (ref <150)

## 2020-07-11 LAB — CBC WITH DIFFERENTIAL/PLATELET
Absolute Monocytes: 656 cells/uL (ref 200–950)
Basophils Absolute: 24 cells/uL (ref 0–200)
Basophils Relative: 0.3 %
Eosinophils Absolute: 111 cells/uL (ref 15–500)
Eosinophils Relative: 1.4 %
HCT: 52.5 % — ABNORMAL HIGH (ref 38.5–50.0)
Hemoglobin: 18.2 g/dL — ABNORMAL HIGH (ref 13.2–17.1)
Lymphs Abs: 988 cells/uL (ref 850–3900)
MCH: 34.2 pg — ABNORMAL HIGH (ref 27.0–33.0)
MCHC: 34.7 g/dL (ref 32.0–36.0)
MCV: 98.7 fL (ref 80.0–100.0)
MPV: 10 fL (ref 7.5–12.5)
Monocytes Relative: 8.3 %
Neutro Abs: 6123 cells/uL (ref 1500–7800)
Neutrophils Relative %: 77.5 %
Platelets: 181 10*3/uL (ref 140–400)
RBC: 5.32 10*6/uL (ref 4.20–5.80)
RDW: 11.8 % (ref 11.0–15.0)
Total Lymphocyte: 12.5 %
WBC: 7.9 10*3/uL (ref 3.8–10.8)

## 2020-07-11 LAB — BASIC METABOLIC PANEL
BUN: 18 mg/dL (ref 7–25)
CO2: 35 mmol/L — ABNORMAL HIGH (ref 20–32)
Calcium: 9.4 mg/dL (ref 8.6–10.3)
Chloride: 100 mmol/L (ref 98–110)
Creat: 1.04 mg/dL (ref 0.70–1.25)
Glucose, Bld: 138 mg/dL — ABNORMAL HIGH (ref 65–99)
Potassium: 3.9 mmol/L (ref 3.5–5.3)
Sodium: 143 mmol/L (ref 135–146)

## 2020-07-11 LAB — TESTOSTERONE TOTAL,FREE,BIO, MALES
Albumin: 4 g/dL (ref 3.6–5.1)
Sex Hormone Binding: 22 nmol/L (ref 22–77)
Testosterone, Bioavailable: 81.3 ng/dL — ABNORMAL LOW (ref >=110.0)
Testosterone, Free: 44.2 pg/mL — ABNORMAL LOW (ref 46.0–224.0)
Testosterone: 250 ng/dL (ref 250–827)

## 2020-07-11 LAB — HEMOGLOBIN A1C
Hgb A1c MFr Bld: 6 % of total Hgb — ABNORMAL HIGH (ref <5.7)
Mean Plasma Glucose: 126 (calc)
eAG (mmol/L): 7 (calc)

## 2020-07-11 LAB — PSA: PSA: 1.7 ng/mL (ref <=4.0)

## 2020-07-11 LAB — TSH: TSH: 0.61 mIU/L (ref 0.40–4.50)

## 2020-07-11 MED ORDER — LIVALO 1 MG PO TABS: 1.0000 | ORAL_TABLET | Freq: Every day | ORAL | 1 refills | Status: DC

## 2020-07-12 ENCOUNTER — Telehealth: Payer: Self-pay | Admitting: General Practice

## 2020-07-12 NOTE — Telephone Encounter (Signed)
  Per Ena Dawley at [facility] Please order bilateral diagnostic mammogram with possible ultrasound

## 2020-07-12 NOTE — Telephone Encounter (Signed)
Diagnostic mammogram has been entered.

## 2020-07-15 ENCOUNTER — Telehealth: Payer: Self-pay

## 2020-07-15 ENCOUNTER — Telehealth: Payer: Self-pay | Admitting: Pulmonary Disease

## 2020-07-15 ENCOUNTER — Telehealth: Payer: Self-pay | Admitting: Emergency Medicine

## 2020-07-15 ENCOUNTER — Other Ambulatory Visit: Payer: Self-pay | Admitting: Internal Medicine

## 2020-07-15 DIAGNOSIS — N6320 Unspecified lump in the left breast, unspecified quadrant: Secondary | ICD-10-CM

## 2020-07-15 NOTE — Telephone Encounter (Signed)
Key: [id number]

## 2020-07-15 NOTE — Telephone Encounter (Signed)
Called and spoke with Patient.  Dr Agustina Caroli recommendations given.  Understanding stated. Patient stated he is using Astelin nasal spray, and was going to use plain Mucinex (guaifenesin). Nothing further at this time.

## 2020-07-15 NOTE — Telephone Encounter (Signed)
Called and spoke with patient he states that when he lays down at night he feels congested. He sleeps with his head elevated but when he rolls over on side that's when he states he feels it running down his throat and stays up half the night coughing. He states his cough is productive but sputum is clear.  Patient states he has high BP and currently does have Coricidin Hbp Cough And Cold at home. Suggested Mucinex or Delsym but is concerned about his BP going up.   [name] please

## 2020-07-15 NOTE — Telephone Encounter (Signed)
Make sure he is still using his Astelin/fluticasone nasal spray It is okay for him to use Mucinex as long as its plain Mucinex (guaifenesin), not Mucinex-D, Mucinex-DM, etc.

## 2020-07-15 NOTE — Telephone Encounter (Signed)
error 

## 2020-07-16 ENCOUNTER — Telehealth: Payer: Self-pay | Admitting: Emergency Medicine

## 2020-07-16 ENCOUNTER — Telehealth: Payer: Self-pay

## 2020-07-16 NOTE — Telephone Encounter (Signed)
PA for Livalo has been approved. lvm informing pt of same

## 2020-07-16 NOTE — Telephone Encounter (Signed)
Pt called and informed of lab results and rx recommendations.

## 2020-07-16 NOTE — Telephone Encounter (Deleted)
error 

## 2020-07-16 NOTE — Telephone Encounter (Signed)
Tried calling the pt- LMTCB x 1

## 2020-07-17 NOTE — Telephone Encounter (Signed)
Called the pt  His insurance changed and Dulera no longer covered  [place]  [employer] help desk is [phone]  Pt ID number is [phone]  Pt has tried symbicort, spiriva, breo and advair  She is faxing form to fill out and will need to attach chart notes and fax back  Will await fax- forwarding to triage to keep an eye out for this- reference number is [id number]

## 2020-07-17 NOTE — Telephone Encounter (Signed)
Received fax, [place] signed and I have faxed to [facility] the pt back and he did not answer- LMTCB

## 2020-07-17 NOTE — Telephone Encounter (Signed)
Pt called back, please return call  

## 2020-07-17 NOTE — Telephone Encounter (Signed)
ATC pt, there was no answer. The line continued to ring with no VM picking up. Will try back.

## 2020-07-18 MED ORDER — PREDNISONE 10 MG PO TABS: 5.0000 mg | ORAL_TABLET | Freq: Every day | ORAL | 2 refills | Status: DC

## 2020-07-18 NOTE — Telephone Encounter (Signed)
Pt calling back about this, please return call.  

## 2020-07-18 NOTE — Telephone Encounter (Signed)
LMTCB x2 for pt 

## 2020-07-18 NOTE — Telephone Encounter (Signed)
Spoke with the pt  He is asking for refill on pred  Rx sent  Advised awaiting determination on [place] PA

## 2020-07-19 ENCOUNTER — Other Ambulatory Visit: Payer: 59

## 2020-07-22 ENCOUNTER — Telehealth: Payer: Self-pay | Admitting: Internal Medicine

## 2020-07-22 ENCOUNTER — Telehealth: Payer: Self-pay

## 2020-07-22 ENCOUNTER — Other Ambulatory Visit: Payer: Self-pay

## 2020-07-22 NOTE — Telephone Encounter (Signed)
Patient has been approved for [facility] 256mcg inhaler as of 07/18/20 to 07/18/21.

## 2020-07-22 NOTE — Telephone Encounter (Signed)
New message    Pt c/o medication issue:  1. Name of Medication: Pitavastatin Calcium (LIVALO) 1 MG TABS  2. How are you currently taking this medication (dosage and times per day)?  One time a day   3. Are you having a reaction (difficulty breathing--STAT)? No   4. What is your medication issue? Want to switch to Zetia - worse headache   5. [place], [place] [facility] [place]

## 2020-07-22 NOTE — Telephone Encounter (Signed)
   Patient wants to discuss testing; uric acid levels He is concerned about gout

## 2020-07-23 ENCOUNTER — Other Ambulatory Visit: Payer: Self-pay | Admitting: Internal Medicine

## 2020-07-23 DIAGNOSIS — E785 Hyperlipidemia, unspecified: Secondary | ICD-10-CM

## 2020-07-23 MED ORDER — EZETIMIBE 10 MG PO TABS: 10.0000 mg | ORAL_TABLET | Freq: Every day | ORAL | 1 refills | Status: DC

## 2020-07-23 NOTE — Telephone Encounter (Signed)
Patient states that he has had a headache since starting the Livalo. Pt is requesting to change to Zetia. Please advise.

## 2020-07-23 NOTE — Telephone Encounter (Signed)
Per PCP - Zetia has been sent as requested.

## 2020-07-24 ENCOUNTER — Telehealth: Payer: Self-pay | Admitting: Emergency Medicine

## 2020-07-24 MED ORDER — PREDNISONE 10 MG PO TABS
10.0000 mg | ORAL_TABLET | Freq: Every day | ORAL | 0 refills | Status: DC
Start: 2020-07-24 — End: 2020-08-05

## 2020-07-24 NOTE — Telephone Encounter (Signed)
States he believes he has Bronchitis.  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  For the past 3 days he has been congested in his chest and has a mostly dry cough, when he is able to get something up, it is clear. At times he feels like he can hardly breath. He gets sob with exertion.  He is taking 5mg  of Prednisone daily.  He denies any fever, chills or body aches.  [name], please advise.  Thank you.

## 2020-07-24 NOTE — Telephone Encounter (Signed)
[name], patient can not tolerate Z-pack due to stomach issues, requesting Levoquin. Pred taper sent as requested. Please advise on antibiotic alternative.

## 2020-07-24 NOTE — Telephone Encounter (Signed)
Please order: Azithromycin >> Z pack Pred taper: Take 40mg  daily for 3 days, then 30mg  daily for 3 days, then 20mg  daily for 3 days, then 10mg  daily for 3 days, then go back to the 5mg  daily and stay there

## 2020-07-25 ENCOUNTER — Telehealth: Payer: Self-pay

## 2020-07-25 ENCOUNTER — Encounter: Payer: Self-pay | Admitting: Internal Medicine

## 2020-07-25 DIAGNOSIS — Z1211 Encounter for screening for malignant neoplasm of colon: Secondary | ICD-10-CM

## 2020-07-25 MED ORDER — LEVOFLOXACIN 500 MG PO TABS
500.0000 mg | ORAL_TABLET | Freq: Every day | ORAL | 0 refills | Status: DC
Start: 2020-07-25 — End: 2020-08-08

## 2020-07-25 NOTE — Telephone Encounter (Signed)
FYI: patient may call back today and there are 2 notes for him (see in chart).    LVM for pt to call back as soon as possible.   RE: pt is due for another follow up colonoscopy. Report from Dr. Earlean Shawl indicated 2 yr colonoscopy follow up. Last colonoscopy was in 2018. Impression: 34 polyps removed - all were tubular adenoma's  Referral has been entered.

## 2020-07-25 NOTE — Telephone Encounter (Signed)
Sent epic secure chat message and paged [name] as he is in the hospital regarding issue with Antibiotic prescribed on 07/24/2020.  Will await a response from [name].

## 2020-07-25 NOTE — Telephone Encounter (Signed)
OK to change azithro to levaquin 500mg  qd x 5 days.

## 2020-07-25 NOTE — Telephone Encounter (Signed)
Patient contacted that Levaquin called into preferred pharmacy.

## 2020-07-25 NOTE — Telephone Encounter (Signed)
Pt will need to come in for an appointment and labs to document concerns.

## 2020-07-26 ENCOUNTER — Telehealth: Payer: Self-pay | Admitting: Internal Medicine

## 2020-07-26 NOTE — Telephone Encounter (Signed)
F/u   Checking on the status on prescription refills.

## 2020-07-26 NOTE — Telephone Encounter (Addendum)
  Patient calling to report he has no medication remaining. He is going out of town today. Scheduler advised patient Robert Church not in the office today.   1.Medication Requested: omeprazole (PRILOSEC) 20 MG capsule ALPRAZolam (XANAX) 1 MG tablet  2. Pharmacy (Name, Street, [place], [place] - [address] AT [place]  3. On Med List: yes  4. Last Visit with PCP: 07/19  5. Next visit date with PCP: 8/16   Agent: Please be advised that RX refills may take up to 3 business days. We ask that you follow-up with your pharmacy.

## 2020-07-28 ENCOUNTER — Other Ambulatory Visit: Payer: Self-pay | Admitting: Internal Medicine

## 2020-07-28 DIAGNOSIS — R739 Hyperglycemia, unspecified: Secondary | ICD-10-CM

## 2020-07-28 DIAGNOSIS — K2100 Gastro-esophageal reflux disease with esophagitis, without bleeding: Secondary | ICD-10-CM

## 2020-07-28 DIAGNOSIS — F411 Generalized anxiety disorder: Secondary | ICD-10-CM

## 2020-07-28 MED ORDER — OMEPRAZOLE 20 MG PO CPDR: 20.0000 mg | DELAYED_RELEASE_CAPSULE | Freq: Two times a day (BID) | ORAL | 1 refills | Status: DC

## 2020-07-28 MED ORDER — ALPRAZOLAM 1 MG PO TABS: 1.0000 mg | ORAL_TABLET | Freq: Two times a day (BID) | ORAL | 0 refills | Status: DC | PRN

## 2020-07-29 ENCOUNTER — Ambulatory Visit: Payer: 59 | Admitting: Internal Medicine

## 2020-07-29 ENCOUNTER — Ambulatory Visit: Payer: 59 | Admitting: Family Medicine

## 2020-07-29 NOTE — Telephone Encounter (Signed)
Pt contacted and informed of same.  

## 2020-07-29 NOTE — Telephone Encounter (Signed)
All meds were sent in on 07/28/2020.

## 2020-07-30 ENCOUNTER — Telehealth: Payer: Self-pay | Admitting: Gastroenterology

## 2020-07-30 NOTE — Telephone Encounter (Signed)
Hey Dr Lyndel Safe, this pt is being referred by [facility] for a colonoscopy but has been seeing [facility] since 06/07/2019. Notes are in epic to review. Please advise on scheduling

## 2020-07-31 ENCOUNTER — Ambulatory Visit: Payer: 59

## 2020-07-31 ENCOUNTER — Ambulatory Visit
Admission: RE | Admit: 2020-07-31 | Discharge: 2020-07-31 | Disposition: A | Discharge status: Home / Self Care | Payer: 59 | Source: Ambulatory Visit | Attending: Internal Medicine | Admitting: Internal Medicine

## 2020-07-31 ENCOUNTER — Other Ambulatory Visit: Payer: Self-pay

## 2020-07-31 DIAGNOSIS — N6320 Unspecified lump in the left breast, unspecified quadrant: Secondary | ICD-10-CM

## 2020-08-05 ENCOUNTER — Telehealth: Payer: Self-pay | Admitting: Internal Medicine

## 2020-08-05 ENCOUNTER — Encounter: Payer: Self-pay | Admitting: Internal Medicine

## 2020-08-05 ENCOUNTER — Ambulatory Visit (INDEPENDENT_AMBULATORY_CARE_PROVIDER_SITE_OTHER): Payer: 59 | Admitting: Internal Medicine

## 2020-08-05 ENCOUNTER — Other Ambulatory Visit: Payer: Self-pay

## 2020-08-05 VITALS — BP 140/92 | HR 82 | Temp 98.1°F | Ht 67.3 in | Wt 245.0 lb

## 2020-08-05 DIAGNOSIS — M79671 Pain in right foot: Secondary | ICD-10-CM | POA: Diagnosis not present

## 2020-08-05 DIAGNOSIS — M1A0790 Idiopathic chronic gout, unspecified ankle and foot, without tophus (tophi): Secondary | ICD-10-CM | POA: Diagnosis not present

## 2020-08-05 DIAGNOSIS — M79672 Pain in left foot: Secondary | ICD-10-CM

## 2020-08-05 DIAGNOSIS — Z6838 Body mass index (BMI) 38.0-38.9, adult: Secondary | ICD-10-CM

## 2020-08-05 DIAGNOSIS — Z114 Encounter for screening for human immunodeficiency virus [HIV]: Secondary | ICD-10-CM

## 2020-08-05 DIAGNOSIS — Z1159 Encounter for screening for other viral diseases: Secondary | ICD-10-CM | POA: Insufficient documentation

## 2020-08-05 MED ORDER — NOVOFINE 32G X 6 MM MISC: 1.0000 | 0 refills | Status: DC

## 2020-08-05 MED ORDER — WEGOVY 0.25 MG/0.5ML ~~LOC~~ SOAJ: 0.2500 mg | SUBCUTANEOUS | 0 refills | Status: DC

## 2020-08-05 NOTE — Progress Notes (Signed)
Subjective:  Patient ID: Robert Church, male    DOB: 02/14/1956  Age: 64 y.o. MRN: [id number]  CC: Hypertension and Foot Pain  This visit occurred during the SARS-CoV-2 public health emergency.  Safety protocols were in place, including screening questions prior to the visit, additional usage of staff PPE, and extensive cleaning of exam room while observing appropriate contact time as indicated for disinfecting solutions.    HPI Robert Church presents for f/up - He complains of a 32-month history of lower extremity pain and swelling.  There is some component of claudication.  He tells me that he has seen a vascular surgeon and is scheduled to have ABIs done within the next week.  He also complains of chronic pain and swelling in his feet.  He has a history of gout.  He is taking an outdated supply of allopurinol and colchicine.  He is also been controlling the pain with Goody powders.  He has chronic knee pain but no recent episodes of swelling in the knees.  Outpatient Medications Prior to Visit  Medication Sig Dispense Refill  . albuterol (PROVENTIL) (2.5 MG/3ML) 0.083% nebulizer solution Take 3 mLs (2.5 mg total) by nebulization every 6 (six) hours as needed for wheezing or shortness of breath. 75 mL 12  . albuterol (VENTOLIN HFA) 108 (90 Base) MCG/ACT inhaler Inhale 2 puffs into the lungs every 6 (six) hours as needed for wheezing. 18 g 5  . ALPRAZolam (XANAX) 1 MG tablet Take 1 tablet (1 mg total) by mouth 2 (two) times daily as needed for anxiety. 180 tablet 0  . Azelastine-Fluticasone 137-50 MCG/ACT SUSP 1 spray each nostril twice daily 23 g 6  . cholecalciferol (VITAMIN D) 1000 units tablet Take 1,000 Units by mouth daily.    Marland Kitchen ezetimibe (ZETIA) 10 MG tablet Take 1 tablet (10 mg total) by mouth daily. 90 tablet 1  . losartan (COZAAR) 100 MG tablet Take 1 tablet (100 mg total) by mouth daily. 90 tablet 3  . mometasone-formoterol (DULERA) 200-5 MCG/ACT AERO Take 2 puffs  first thing in am and then another 2 puffs about 12 hours later. 13 g 5  . montelukast (SINGULAIR) 10 MG tablet Take 1 tablet (10 mg total) by mouth at bedtime. 30 tablet 1  . omeprazole (PRILOSEC) 20 MG capsule Take 1 capsule (20 mg total) by mouth 2 (two) times daily before a meal. 180 capsule 1  . predniSONE (DELTASONE) 10 MG tablet Take 0.5 tablets (5 mg total) by mouth daily with breakfast. Pt taking 1/2 tablet 30 tablet 2  . roflumilast (DALIRESP) 500 MCG TABS tablet Take 1 tablet (500 mcg total) by mouth daily. 30 tablet 3  . Testosterone 30 MG/ACT SOLN Apply 1 application topically daily. Apply under each arm daily    . Tiotropium Bromide Monohydrate (SPIRIVA RESPIMAT) 2.5 MCG/ACT AERS Inhale 2.5 mcg into the lungs 2 (two) times daily. 4 g 5  . torsemide (DEMADEX) 20 MG tablet Take 2 tablets (40 mg total) by mouth daily. 180 tablet 3  . levofloxacin (LEVAQUIN) 500 MG tablet Take 1 tablet (500 mg total) by mouth daily. 5 tablet 0  . predniSONE (DELTASONE) 10 MG tablet Take 1 tablet (10 mg total) by mouth daily with breakfast. 4 tablets for 3 days, 3 tablets for 3 days, 2 tablets for 3 days, 1 tablet for three days then take 5 mg each day 30 tablet 0   No facility-administered medications prior to visit.  ROS Review of Systems  Constitutional: Negative for appetite change, chills, diaphoresis, fatigue and fever.  HENT: Negative.   Respiratory: Positive for shortness of breath and wheezing. Negative for cough, choking, chest tightness and stridor.   Cardiovascular: Positive for leg swelling. Negative for chest pain and palpitations.  Gastrointestinal: Negative for abdominal pain, blood in stool, constipation, diarrhea, nausea and vomiting.  Genitourinary: Negative.  Negative for difficulty urinating.  Musculoskeletal: Positive for arthralgias. Negative for myalgias.  Skin: Negative for color change, pallor and rash.  Neurological: Negative.  Negative for dizziness, weakness,  light-headedness and numbness.  Hematological: Negative for adenopathy. Does not bruise/bleed easily.  Psychiatric/Behavioral: Negative.     Objective:  BP (!) 140/92 (BP Location: Left Arm, Patient Position: Sitting, Cuff Size: Large)   Pulse 82   Temp 98.1 F (36.7 C) (Oral)   Ht 5' 7.3" (1.709 m)   Wt 245 lb (111.1 kg)   SpO2 92%   BMI 38.03 kg/m   BP Readings from Last 3 Encounters:  08/08/20 (!) 146/84  08/05/20 (!) 140/92  07/08/20 (!) 164/88    Wt Readings from Last 3 Encounters:  08/08/20 242 lb (109.8 kg)  08/05/20 245 lb (111.1 kg)  07/08/20 241 lb (109.3 kg)    Physical Exam Vitals reviewed.  Constitutional:      General: He is not in acute distress.    Appearance: He is obese. He is ill-appearing. He is not toxic-appearing or diaphoretic.  HENT:     Nose: Nose normal.     Mouth/Throat:     Mouth: Mucous membranes are moist.  Eyes:     General: No scleral icterus.    Conjunctiva/sclera: Conjunctivae normal.  Cardiovascular:     Rate and Rhythm: Normal rate.     Pulses:          Dorsalis pedis pulses are 0 on the right side and 0 on the left side.       Posterior tibial pulses are 0 on the right side and 0 on the left side.     Heart sounds: No murmur heard.  No gallop.   Pulmonary:     Effort: Pulmonary effort is normal.     Breath sounds: No wheezing, rhonchi or rales.  Abdominal:     General: There is distension.     Tenderness: There is no abdominal tenderness. There is no guarding.  Musculoskeletal:        General: Normal range of motion.     Cervical back: Neck supple.     Right lower leg: Edema (trace) present.     Left lower leg: Edema (trace) present.     Right foot: Normal range of motion and normal capillary refill. Swelling, tenderness and bony tenderness present. No deformity.     Left foot: Normal range of motion and normal capillary refill. Swelling, tenderness and bony tenderness present. No deformity.     Comments: Both feet are  diffusely tender with degenerative changes  Feet:     Right foot:     Skin integrity: Erythema present. No ulcer, skin breakdown or warmth.     Toenail Condition: Right toenails are normal.     Left foot:     Skin integrity: Erythema present. No ulcer, skin breakdown or warmth.     Toenail Condition: Left toenails are normal.     Comments: + edema over both feet Lymphadenopathy:     Cervical: No cervical adenopathy.  Skin:    General: Skin is warm and  dry.     Findings: No rash.  Neurological:     General: No focal deficit present.     Mental Status: He is alert.  Psychiatric:        Mood and Affect: Mood normal.        Behavior: Behavior normal.     Lab Results  Component Value Date   WBC 7.9 07/10/2020   HGB 18.2 (H) 07/10/2020   HCT 52.5 (H) 07/10/2020   PLT 181 07/10/2020   GLUCOSE 138 (H) 07/10/2020   CHOL 177 07/10/2020   TRIG 150 (H) 07/10/2020   HDL 43 07/10/2020   LDLCALC 108 (H) 07/10/2020   ALT 24 05/12/2016   AST 22 05/12/2016   NA 143 07/10/2020   K 3.9 07/10/2020   CL 100 07/10/2020   CREATININE 1.04 07/10/2020   BUN 18 07/10/2020   CO2 35 (H) 07/10/2020   TSH 0.61 07/10/2020   PSA 1.7 07/10/2020   INR 1.0 04/15/2017   HGBA1C 6.0 (H) 07/10/2020    MM DIAG BREAST TOMO BILATERAL  Result Date: 07/31/2020 CLINICAL DATA:  64 year old male with a tender palpable lump behind the LEFT nipple which he initially noted approximately 2 months ago. This is the patient's initial mammogram. EXAM: DIGITAL DIAGNOSTIC BILATERAL MAMMOGRAM WITH TOMO AND CAD COMPARISON:  None. ACR Breast Density Category b: There are scattered areas of fibroglandular density. FINDINGS: Tomosynthesis and synthesized full field CC and MLO views of both breasts were obtained. Tomosynthesis and synthesized spot compression tangential view of the area of concern in the LEFT breast was also obtained. Moderate LEFT gynecomastia and minimal RIGHT gynecomastia are present. No findings suspicious for  malignancy in either breast. Mammographic images were processed with CAD. IMPRESSION: 1. No mammographic evidence of malignancy involving either breast. 2. Moderate LEFT gynecomastia and minimal RIGHT gynecomastia. RECOMMENDATION: No further imaging follow-up is felt necessary unless there are persistent or subsequent clinical concerns. I discussed with the patient the fact that gynecomastia can occur in men as testosterone levels decrease with age or in younger men with low testosterone levels, causing a change in the serum testosterone:estrogen ratio. We also discussed other potential etiologies of gynecomastia including numerous prescription medications and recreational drugs (marijuana and anabolic steroids in particular). Approximately 20% of cases of gynecomastia are idiopathic. I counseled the patient to perform self-examination to make sure that a hard lump does not develop which could indicate malignancy and would need further evaluation. We also discussed the possibility of surgical excision if symptoms continue and if an etiology of the gynecomastia cannot be determined and therefore corrected. I have discussed the findings and recommendations with the patient. BI-RADS CATEGORY  2: Benign. Electronically Signed   By: Evangeline Dakin M.D.   On: 07/31/2020 13:27   DG ABD ACUTE 2+V W 1V CHEST  Result Date: 08/08/2020 CLINICAL DATA:  Distended abdomen. EXAM: DG ABDOMEN ACUTE W/ 1V CHEST COMPARISON:  Chest x-ray 06/27/2018, 08/15/2017, 12/23/2016, 07/01/2016, 02/26/2016, 03/02/2013. FINDINGS: Stable cardiomegaly and pulmonary venous congestion. No focal infiltrate. No pleural effusion or pneumothorax. Lateral portions of the abdomen not completely imaged. Soft tissues of the abdomen unremarkable. No bowel distention. Faint lucency noted about the right hemidiaphragm most likely secondary to a diaphragmatic fold/intra-abdominal fat and has been present on multiple prior chest x-rays. This finding is most  likely not secondary to intraperitoneal air. If symptoms persist CT of the abdomen can be obtained for further evaluation. Vascular calcification noted in the pelvis. Thoracolumbar spine degenerative change. IMPRESSION: 1.  Stable cardiomegaly and pulmonary venous congestion. No focal infiltrate. 2. No bowel distention. Faint lucency noted about the right hemidiaphragm is most likely secondary to a diaphragmatic fold/intra-abdominal fat and has been present on multiple prior chest x-rays. This finding is most likely not secondary to free intra peritoneal air. If symptoms persist CT of the abdomen pelvis can be obtained for further evaluation. These results will be called to the ordering clinician or representative by the Radiologist Assistant, and communication documented in the PACS or [employer]. Electronically Signed   By: Marcello Moores  Register   On: 08/08/2020 14:55   DG Foot Complete Left  Result Date: 08/08/2020 CLINICAL DATA:  Chronic pain swelling EXAM: LEFT FOOT - COMPLETE 3+ VIEW COMPARISON:  None. FINDINGS: Frontal, oblique, and lateral views were obtained. There is no fracture or dislocation. There is narrowing of the first MTP joint as well as the first tarsal-metatarsal joint. There is spurring along the dorsal aspect of the medial portion of the first tarsal-metatarsal joint. Other joint spaces appear normal. No erosive change. There is a small inferior calcaneal spur. 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 Other joint spaces appear unremarkable. There is a small inferior calcaneal spur. No fracture or dislocation. Electronically Signed   By: Lowella Grip III M.D.   On: 08/08/2020 10:27   DG Foot Complete Right  Result Date: 08/08/2020 CLINICAL DATA:  Pain EXAM: RIGHT FOOT COMPLETE - 3+ VIEW COMPARISON:  May 24, 2017 FINDINGS: Frontal, oblique, and lateral views were obtained. There is extensive soft tissue swelling throughout the dorsal  aspect of the foot, similar to prior study. No fracture or dislocation. There is moderate narrowing of the first MTP joint with extensive intra-articular calcification in this region, similar to prior study. Small erosions are noted in the first MTP joint. There is narrowing and spurring in the first tarsal-metatarsal joint. There is a small exostosis arising from the distal talus. IMPRESSION: Diffuse soft tissue swelling throughout the dorsal foot, similar to prior study. Joint space narrowing with mild erosive change in the first MTP joint. There is extensive intra-articular calcification in this joint, stable. Major differential considerations for this appearance include calcium pyrophosphate deposition disease and gout. Calcium pyrophosphate deposition disease may cause pseudogout. There is narrowing with spurring in the first tarsal-metatarsal joint. Other joint spaces appear unremarkable. No fracture or dislocation. Electronically Signed   By: Lowella Grip III M.D.   On: 08/08/2020 10:29    Assessment & Plan:   Robert Church was seen today for hypertension and foot pain.  Diagnoses and all orders for this visit:  Foot pain, bilateral- I will treat him for gout and pseudogout with colchicine.  I have asked him to stop taking Goody powder due to the risk of complications related to this.  Plain films show osteoarthritis but I do not think he is a surgical candidate.  Will see what his arterial flow studies reveal. -     DG Foot Complete Left; Future -     DG Foot Complete Right; Future -     Uric acid; Future -     Sedimentation rate; Future  Need for hepatitis C screening test -     Hepatitis C antibody; Future  Screening for HIV (human immunodeficiency virus) -     HIV Antibody (routine testing w rflx); Future  Class 2 severe obesity due to excess calories with serious comorbidity and body mass index (BMI) of 38.0 to 38.9 in adult [facility])- I  recommended that he try a GLP-1 agonist to help him  lose weight. -     Discontinue: Semaglutide-Weight Management (WEGOVY) 0.25 MG/0.5ML SOAJ; Inject 0.25 mg into the skin once a week. -     Insulin Pen Needle (NOVOFINE) 32G X 6 MM MISC; 1 Act by Does not apply route once a week.  Idiopathic chronic gout of foot without tophus, unspecified laterality- His uric acid level is elevated at 8.2.  I have asked him to take allopurinol and colchicine to treat the pain and inflammation in his feet. -     Colchicine (MITIGARE) 0.6 MG CAPS; Take 1 tablet by mouth 2 (two) times daily. -     allopurinol (ZYLOPRIM) 300 MG tablet; Take 1 tablet (300 mg total) by mouth daily.   I have discontinued Max Nuno. Fretwell's levofloxacin and [phone]. I am also having him start on NovoFine, Colchicine, and allopurinol. Additionally, I am having him maintain his albuterol, Azelastine-Fluticasone, cholecalciferol, Testosterone, torsemide, montelukast, roflumilast, losartan, Spiriva Respimat, Dulera, albuterol, predniSONE, ezetimibe, ALPRAZolam, and omeprazole.  Meds ordered this encounter  Medications  . DISCONTD: Semaglutide-Weight Management (WEGOVY) 0.25 MG/0.5ML SOAJ    Sig: Inject 0.25 mg into the skin once a week.    Dispense:  2 mL    Refill:  0  . Insulin Pen Needle (NOVOFINE) 32G X 6 MM MISC    Sig: 1 Act by Does not apply route once a week.    Dispense:  50 each    Refill:  0  . Colchicine (MITIGARE) 0.6 MG CAPS    Sig: Take 1 tablet by mouth 2 (two) times daily.    Dispense:  60 capsule    Refill:  5  . allopurinol (ZYLOPRIM) 300 MG tablet    Sig: Take 1 tablet (300 mg total) by mouth daily.    Dispense:  30 tablet    Refill:  5     Follow-up: Return in about 6 weeks (around 09/16/2020).  Scarlette Calico, MD

## 2020-08-05 NOTE — Telephone Encounter (Signed)
   Patient calling to request order for EpiPen. Patient states he forgot to mention during today's office visit. He has been stung by bee's in the past couple of weeks and needed to take Benadryl to calm reaction

## 2020-08-05 NOTE — Patient Instructions (Signed)
Foot Care, Adult Foot care is an important part of your health. Noticing and addressing any potential problems early is the best way to prevent future foot problems. How to care for your [place] your feet daily with warm water and mild soap. Do not use hot water. Then, pat your feet and the areas between your toes until they are completely dry. Do not soak your feet as this can dry your skin.  Trim your toenails straight across. Do not dig under them or around the cuticle. File the edges of your nails with an emery board or nail file.  On the skin on your feet and on dry, brittle nails, apply a moisturizing lotion or petroleum jelly that is unscented and does not contain alcohol. Do not apply lotion between your toes. Shoes and Socks   Wear clean socks or stockings every day. Make sure they are not too tight.  Wear shoes that fit properly and have enough cushioning. To break in new shoes, wear them for just a few hours a day. This prevents you from injuring your feet. Always look in your shoes before you put them on to be sure there are no objects inside. Wounds, Scrapes, Corns, and Calluses  Check your feet daily for blisters, cuts, and redness. If you cannot see the bottom of your feet, use a mirror or ask someone for help.  Do not cut corns or calluses. Do not try to remove them with medicine.  If you find a minor scrape, cut, or break in the skin on your feet, keep it and the skin around it clean and dry. These areas may be cleaned with mild soap and water. Do not clean the area with peroxide, alcohol, or iodine.  If you have a wound, scrape, corn, or callus on your foot, look at it several times a day to make sure it is healing and is not infected. Check for: ? More redness, swelling, or pain. ? More fluid or blood. ? Warmth. ? Pus or a bad smell. General Instructions  Do not cross your legs. That may decrease the blood flow to your feet.  Do not use heating pads  or hot water bottles on your feet. They may burn your skin. If you have lost feeling in your feet or legs, you may not know it is happening until it is too late.  Make sure your health care provider does a complete foot exam at least annually or more often if you have foot problems. If you have foot problems, report any cuts, sores, or bruises to your health care provider immediately. Contact a health care provider if:  You have a medical condition that increases your risk of infection and you have any cuts, sores, or bruises on your feet.  You have an injury that is not healing.  You notice redness on your legs or feet.  You feel burning or tingling in your legs or feet.  You have pain or cramps in your legs or feet.  Your legs or feet are numb.  Your feet always feel cold.  You have pain around a toenail. Get help right away if:  You have a wound, scrape, corn, or callus on your foot and: ? You have more redness, swelling, or pain. ? You have more fluid or blood. ? Your wound, scrape, corn, or callus feels warm to the touch. ? You have pus or a bad smell coming from the wound, scrape, corn, or callus. ?  You have a fever.  You have a red line going up your leg. This information is not intended to replace advice given to you by your health care provider. Make sure you discuss any questions you have with your health care provider. Document Revised: 12/24/2016 Document Reviewed: 05/15/2016 Elsevier Patient Education  [place].

## 2020-08-06 ENCOUNTER — Telehealth: Payer: Self-pay | Admitting: Internal Medicine

## 2020-08-06 NOTE — Telephone Encounter (Signed)
Key: [id number]

## 2020-08-06 NOTE — Telephone Encounter (Signed)
    Patient calling to request prior auth for Semaglutide-Weight Management (WEGOVY) 0.25 MG/0.5ML SOAJ

## 2020-08-07 ENCOUNTER — Ambulatory Visit (INDEPENDENT_AMBULATORY_CARE_PROVIDER_SITE_OTHER): Payer: 59

## 2020-08-07 ENCOUNTER — Other Ambulatory Visit: Payer: 59

## 2020-08-07 DIAGNOSIS — M79671 Pain in right foot: Secondary | ICD-10-CM | POA: Diagnosis not present

## 2020-08-07 DIAGNOSIS — M79672 Pain in left foot: Secondary | ICD-10-CM | POA: Diagnosis not present

## 2020-08-07 DIAGNOSIS — Z114 Encounter for screening for human immunodeficiency virus [HIV]: Secondary | ICD-10-CM

## 2020-08-07 DIAGNOSIS — Z1159 Encounter for screening for other viral diseases: Secondary | ICD-10-CM

## 2020-08-07 NOTE — Telephone Encounter (Signed)
   Patient calling, states the pharmacy unable to dispense medication, he is unsure why. Scheduler advised patient  [name] had been initiated

## 2020-08-08 ENCOUNTER — Encounter: Payer: Self-pay | Admitting: Internal Medicine

## 2020-08-08 ENCOUNTER — Telehealth: Payer: Self-pay | Admitting: Internal Medicine

## 2020-08-08 ENCOUNTER — Ambulatory Visit (INDEPENDENT_AMBULATORY_CARE_PROVIDER_SITE_OTHER): Payer: 59 | Admitting: Internal Medicine

## 2020-08-08 ENCOUNTER — Other Ambulatory Visit: Payer: Self-pay

## 2020-08-08 ENCOUNTER — Ambulatory Visit (INDEPENDENT_AMBULATORY_CARE_PROVIDER_SITE_OTHER): Payer: 59

## 2020-08-08 VITALS — BP 146/84 | HR 89 | Temp 98.2°F | Resp 16 | Ht 67.3 in | Wt 242.0 lb

## 2020-08-08 DIAGNOSIS — R7303 Prediabetes: Secondary | ICD-10-CM

## 2020-08-08 DIAGNOSIS — E785 Hyperlipidemia, unspecified: Secondary | ICD-10-CM

## 2020-08-08 DIAGNOSIS — Z91030 Bee allergy status: Secondary | ICD-10-CM | POA: Diagnosis not present

## 2020-08-08 DIAGNOSIS — M1A0790 Idiopathic chronic gout, unspecified ankle and foot, without tophus (tophi): Secondary | ICD-10-CM | POA: Insufficient documentation

## 2020-08-08 DIAGNOSIS — N62 Hypertrophy of breast: Secondary | ICD-10-CM

## 2020-08-08 DIAGNOSIS — R140 Abdominal distension (gaseous): Secondary | ICD-10-CM | POA: Diagnosis not present

## 2020-08-08 DIAGNOSIS — L989 Disorder of the skin and subcutaneous tissue, unspecified: Secondary | ICD-10-CM

## 2020-08-08 MED ORDER — COLCHICINE 0.6 MG PO CAPS: 1.0000 | ORAL_CAPSULE | Freq: Two times a day (BID) | ORAL | 5 refills | Status: DC

## 2020-08-08 MED ORDER — ALLOPURINOL 300 MG PO TABS: 300.0000 mg | ORAL_TABLET | Freq: Every day | ORAL | 5 refills | Status: DC

## 2020-08-08 MED ORDER — OZEMPIC (0.25 OR 0.5 MG/DOSE) 2 MG/1.5ML ~~LOC~~ SOPN: 0.2500 mg | PEN_INJECTOR | SUBCUTANEOUS | 0 refills | Status: DC

## 2020-08-08 MED ORDER — EPINEPHRINE 0.3 MG/0.3ML IJ SOAJ: 0.3000 mg | INTRAMUSCULAR | 2 refills | Status: DC | PRN

## 2020-08-08 NOTE — Telephone Encounter (Signed)
    Patient had appointment today with Dr Ronnald Ramp. Patient wants to know the name of the Dermatologist mentioned that would be most appropriate for him to see

## 2020-08-08 NOTE — Telephone Encounter (Signed)
Pt scheduled for visit today

## 2020-08-08 NOTE — Progress Notes (Signed)
Subjective:  Patient ID: Robert Church, male    DOB: 1956-09-01  Age: 64 y.o. MRN: [id number]  CC: Hypertension  This visit occurred during the SARS-CoV-2 public health emergency.  Safety protocols were in place, including screening questions prior to the visit, additional usage of staff PPE, and extensive cleaning of exam room while observing appropriate contact time as indicated for disinfecting solutions.    HPI Robert Church presents for f/up - He continues to complain of pain in both feet.  His recent x-rays and labs were concerning for osteoarthritis and gout/pseudogout.  He is taking an oral steroid.  He had been taking BC powders which I have asked him to stop due to concerns over complications related to NSAIDs.  He has been taking an outdated supply of allopurinol and colchicine.  His main complaint today is that for the last 3 months he has had increasing abdominal girth with diffuse abdominal discomfort.  He complains of weight gain but denies nausea, vomiting, diarrhea, constipation, melena, bright red blood per rectum, or loss of appetite.  Outpatient Medications Prior to Visit  Medication Sig Dispense Refill  . albuterol (PROVENTIL) (2.5 MG/3ML) 0.083% nebulizer solution Take 3 mLs (2.5 mg total) by nebulization every 6 (six) hours as needed for wheezing or shortness of breath. 75 mL 12  . albuterol (VENTOLIN HFA) 108 (90 Base) MCG/ACT inhaler Inhale 2 puffs into the lungs every 6 (six) hours as needed for wheezing. 18 g 5  . ALPRAZolam (XANAX) 1 MG tablet Take 1 tablet (1 mg total) by mouth 2 (two) times daily as needed for anxiety. 180 tablet 0  . Azelastine-Fluticasone 137-50 MCG/ACT SUSP 1 spray each nostril twice daily 23 g 6  . cholecalciferol (VITAMIN D) 1000 units tablet Take 1,000 Units by mouth daily.    Marland Kitchen ezetimibe (ZETIA) 10 MG tablet Take 1 tablet (10 mg total) by mouth daily. 90 tablet 1  . Insulin Pen Needle (NOVOFINE) 32G X 6 MM MISC 1 Act by Does not  apply route once a week. 50 each 0  . losartan (COZAAR) 100 MG tablet Take 1 tablet (100 mg total) by mouth daily. 90 tablet 3  . mometasone-formoterol (DULERA) 200-5 MCG/ACT AERO Take 2 puffs first thing in am and then another 2 puffs about 12 hours later. 13 g 5  . montelukast (SINGULAIR) 10 MG tablet Take 1 tablet (10 mg total) by mouth at bedtime. 30 tablet 1  . omeprazole (PRILOSEC) 20 MG capsule Take 1 capsule (20 mg total) by mouth 2 (two) times daily before a meal. 180 capsule 1  . predniSONE (DELTASONE) 10 MG tablet Take 0.5 tablets (5 mg total) by mouth daily with breakfast. Pt taking 1/2 tablet 30 tablet 2  . roflumilast (DALIRESP) 500 MCG TABS tablet Take 1 tablet (500 mcg total) by mouth daily. 30 tablet 3  . Testosterone 30 MG/ACT SOLN Apply 1 application topically daily. Apply under each arm daily    . Tiotropium Bromide Monohydrate (SPIRIVA RESPIMAT) 2.5 MCG/ACT AERS Inhale 2.5 mcg into the lungs 2 (two) times daily. 4 g 5  . torsemide (DEMADEX) 20 MG tablet Take 2 tablets (40 mg total) by mouth daily. 180 tablet 3   No facility-administered medications prior to visit.    ROS Review of Systems  Constitutional: Positive for unexpected weight change (wt gain). Negative for appetite change, chills, diaphoresis, fatigue and fever.  HENT: Negative.  Negative for trouble swallowing.   Eyes: Negative for visual disturbance.  Respiratory:  Positive for shortness of breath and wheezing. Negative for cough, chest tightness and stridor.   Cardiovascular: Positive for leg swelling. Negative for chest pain and palpitations.  Gastrointestinal: Positive for abdominal distention and abdominal pain. Negative for blood in stool, constipation, diarrhea, nausea and vomiting.  Endocrine: Negative.   Genitourinary: Negative.  Negative for difficulty urinating, dysuria, frequency, hematuria and urgency.  Musculoskeletal: Positive for arthralgias. Negative for back pain and myalgias.  Skin: Negative.   Negative for color change, pallor and rash.  Neurological: Negative.  Negative for dizziness, weakness, light-headedness and headaches.  Hematological: Negative for adenopathy. Does not bruise/bleed easily.  Psychiatric/Behavioral: Negative.     Objective:  BP (!) 146/84 (BP Location: Left Arm, Patient Position: Sitting, Cuff Size: Large)   Pulse 89   Temp 98.2 F (36.8 C) (Oral)   Resp 16   Ht 5' 7.3" (1.709 m)   Wt 242 lb (109.8 kg)   SpO2 90%   BMI 37.57 kg/m   BP Readings from Last 3 Encounters:  08/08/20 (!) 146/84  08/05/20 (!) 140/92  07/08/20 (!) 164/88    Wt Readings from Last 3 Encounters:  08/08/20 242 lb (109.8 kg)  08/05/20 245 lb (111.1 kg)  07/08/20 241 lb (109.3 kg)    Physical Exam Vitals reviewed.  HENT:     Nose: Nose normal.     Mouth/Throat:     Mouth: Mucous membranes are moist.  Eyes:     General: No scleral icterus.    Conjunctiva/sclera: Conjunctivae normal.  Cardiovascular:     Rate and Rhythm: Normal rate and regular rhythm.     Heart sounds: No murmur heard.   Pulmonary:     Effort: No tachypnea or respiratory distress.     Breath sounds: No decreased air movement. Examination of the right-middle field reveals wheezing. Examination of the left-middle field reveals wheezing. Examination of the right-lower field reveals rhonchi. Examination of the left-lower field reveals rhonchi. Wheezing and rhonchi present. No decreased breath sounds or rales.  Abdominal:     General: Abdomen is protuberant. Bowel sounds are decreased.     Palpations: There is no shifting dullness, hepatomegaly, splenomegaly or mass.     Tenderness: There is generalized abdominal tenderness. There is no guarding or rebound.     Hernia: No hernia is present.  Musculoskeletal:        General: Normal range of motion.     Cervical back: Neck supple.     Right lower leg: Edema (trace) present.     Left lower leg: Edema (trace) present.  Lymphadenopathy:     Cervical:  No cervical adenopathy.  Skin:    General: Skin is warm and dry.     Coloration: Skin is not pale.  Neurological:     General: No focal deficit present.     Mental Status: He is alert and oriented to person, place, and time.  Psychiatric:        Mood and Affect: Mood normal.        Behavior: Behavior normal.     Lab Results  Component Value Date   WBC 7.9 07/10/2020   HGB 18.2 (H) 07/10/2020   HCT 52.5 (H) 07/10/2020   PLT 181 07/10/2020   GLUCOSE 138 (H) 07/10/2020   CHOL 177 07/10/2020   TRIG 150 (H) 07/10/2020   HDL 43 07/10/2020   LDLCALC 108 (H) 07/10/2020   ALT 24 05/12/2016   AST 22 05/12/2016   NA 143 07/10/2020   K 3.9  07/10/2020   CL 100 07/10/2020   CREATININE 1.04 07/10/2020   BUN 18 07/10/2020   CO2 35 (H) 07/10/2020   TSH 0.61 07/10/2020   PSA 1.7 07/10/2020   INR 1.0 04/15/2017   HGBA1C 6.0 (H) 07/10/2020    MM DIAG BREAST TOMO BILATERAL  Result Date: 07/31/2020 CLINICAL DATA:  64 year old male with a tender palpable lump behind the LEFT nipple which he initially noted approximately 2 months ago. This is the patient's initial mammogram. EXAM: DIGITAL DIAGNOSTIC BILATERAL MAMMOGRAM WITH TOMO AND CAD COMPARISON:  None. ACR Breast Density Category b: There are scattered areas of fibroglandular density. FINDINGS: Tomosynthesis and synthesized full field CC and MLO views of both breasts were obtained. Tomosynthesis and synthesized spot compression tangential view of the area of concern in the LEFT breast was also obtained. Moderate LEFT gynecomastia and minimal RIGHT gynecomastia are present. No findings suspicious for malignancy in either breast. Mammographic images were processed with CAD. IMPRESSION: 1. No mammographic evidence of malignancy involving either breast. 2. Moderate LEFT gynecomastia and minimal RIGHT gynecomastia. RECOMMENDATION: No further imaging follow-up is felt necessary unless there are persistent or subsequent clinical concerns. I discussed  with the patient the fact that gynecomastia can occur in men as testosterone levels decrease with age or in younger men with low testosterone levels, causing a change in the serum testosterone:estrogen ratio. We also discussed other potential etiologies of gynecomastia including numerous prescription medications and recreational drugs (marijuana and anabolic steroids in particular). Approximately 20% of cases of gynecomastia are idiopathic. I counseled the patient to perform self-examination to make sure that a hard lump does not develop which could indicate malignancy and would need further evaluation. We also discussed the possibility of surgical excision if symptoms continue and if an etiology of the gynecomastia cannot be determined and therefore corrected. I have discussed the findings and recommendations with the patient. BI-RADS CATEGORY  2: Benign. Electronically Signed   By: Evangeline Dakin M.D.   On: 07/31/2020 13:27   DG ABD ACUTE 2+V W 1V CHEST  Result Date: 08/08/2020 CLINICAL DATA:  Distended abdomen. EXAM: DG ABDOMEN ACUTE W/ 1V CHEST COMPARISON:  Chest x-ray 06/27/2018, 08/15/2017, 12/23/2016, 07/01/2016, 02/26/2016, 03/02/2013. FINDINGS: Stable cardiomegaly and pulmonary venous congestion. No focal infiltrate. No pleural effusion or pneumothorax. Lateral portions of the abdomen not completely imaged. Soft tissues of the abdomen unremarkable. No bowel distention. Faint lucency noted about the right hemidiaphragm most likely secondary to a diaphragmatic fold/intra-abdominal fat and has been present on multiple prior chest x-rays. This finding is most likely not secondary to intraperitoneal air. If symptoms persist CT of the abdomen can be obtained for further evaluation. Vascular calcification noted in the pelvis. Thoracolumbar spine degenerative change. IMPRESSION: 1. Stable cardiomegaly and pulmonary venous congestion. No focal infiltrate. 2. No bowel distention. Faint lucency noted about the  right hemidiaphragm is most likely secondary to a diaphragmatic fold/intra-abdominal fat and has been present on multiple prior chest x-rays. This finding is most likely not secondary to free intra peritoneal air. If symptoms persist CT of the abdomen pelvis can be obtained for further evaluation. These results will be called to the ordering clinician or representative by the Radiologist Assistant, and communication documented in the PACS or [employer]. Electronically Signed   By: Marcello Moores  Register   On: 08/08/2020 14:55   DG Foot Complete Left  Result Date: 08/08/2020 CLINICAL DATA:  Chronic pain swelling EXAM: LEFT FOOT - COMPLETE 3+ VIEW COMPARISON:  None. FINDINGS: Frontal, oblique, and  lateral views were obtained. There is no fracture or dislocation. There is narrowing of the first MTP joint as well as the first tarsal-metatarsal joint. There is spurring along the dorsal aspect of the medial portion of the first tarsal-metatarsal joint. Other joint spaces appear normal. No erosive change. There is a small inferior calcaneal spur. 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 Other joint spaces appear unremarkable. There is a small inferior calcaneal spur. No fracture or dislocation. Electronically Signed   By: Lowella Grip III M.D.   On: 08/08/2020 10:27   DG Foot Complete Right  Result Date: 08/08/2020 CLINICAL DATA:  Pain EXAM: RIGHT FOOT COMPLETE - 3+ VIEW COMPARISON:  May 24, 2017 FINDINGS: Frontal, oblique, and lateral views were obtained. There is extensive soft tissue swelling throughout the dorsal aspect of the foot, similar to prior study. No fracture or dislocation. There is moderate narrowing of the first MTP joint with extensive intra-articular calcification in this region, similar to prior study. Small erosions are noted in the first MTP joint. There is narrowing and spurring in the first tarsal-metatarsal joint. There is a small exostosis  arising from the distal talus. IMPRESSION: Diffuse soft tissue swelling throughout the dorsal foot, similar to prior study. Joint space narrowing with mild erosive change in the first MTP joint. There is extensive intra-articular calcification in this joint, stable. Major differential considerations for this appearance include calcium pyrophosphate deposition disease and gout. Calcium pyrophosphate deposition disease may cause pseudogout. There is narrowing with spurring in the first tarsal-metatarsal joint. Other joint spaces appear unremarkable. No fracture or dislocation. Electronically Signed   By: Lowella Grip III M.D.   On: 08/08/2020 10:29   DG ABD ACUTE 2+V W 1V CHEST  Result Date: 08/08/2020 CLINICAL DATA:  Distended abdomen. EXAM: DG ABDOMEN ACUTE W/ 1V CHEST COMPARISON:  Chest x-ray 06/27/2018, 08/15/2017, 12/23/2016, 07/01/2016, 02/26/2016, 03/02/2013. FINDINGS: Stable cardiomegaly and pulmonary venous congestion. No focal infiltrate. No pleural effusion or pneumothorax. Lateral portions of the abdomen not completely imaged. Soft tissues of the abdomen unremarkable. No bowel distention. Faint lucency noted about the right hemidiaphragm most likely secondary to a diaphragmatic fold/intra-abdominal fat and has been present on multiple prior chest x-rays. This finding is most likely not secondary to intraperitoneal air. If symptoms persist CT of the abdomen can be obtained for further evaluation. Vascular calcification noted in the pelvis. Thoracolumbar spine degenerative change. IMPRESSION: 1. Stable cardiomegaly and pulmonary venous congestion. No focal infiltrate. 2. No bowel distention. Faint lucency noted about the right hemidiaphragm is most likely secondary to a diaphragmatic fold/intra-abdominal fat and has been present on multiple prior chest x-rays. This finding is most likely not secondary to free intra peritoneal air. If symptoms persist CT of the abdomen pelvis can be obtained for  further evaluation. These results will be called to the ordering clinician or representative by the Radiologist Assistant, and communication documented in the PACS or [employer]. Electronically Signed   By: Marcello Moores  Register   On: 08/08/2020 14:55    Assessment & Plan:   [place] was seen today for hypertension.  Diagnoses and all orders for this visit:  Hyperlipidemia LDL goal <70- He is tolerating the statin well.  I will monitor his liver enzymes. -     Hepatic function panel; Future  Gynecomastia, male- Will discontinue the TRT.  Abdominal distension- See below. -     DG ABD ACUTE 2+V W 1V CHEST; Future -     CT Abdomen  Pelvis W Contrast; Future  Yellow jacket sting allergy -     EPINEPHrine 0.3 mg/0.3 mL IJ SOAJ injection; Inject 0.3 mLs (0.3 mg total) into the muscle as needed for anaphylaxis.  Abdominal distension (gaseous)- Plain film shows a faint lucency around the right hemidiaphragm.  I have asked him to undergo a CT scan of the abdomen with contrast to see if there is ascites, cirrhosis, peptic ulcer disease, gastroesophageal varices, bowel obstruction, mass, or renal pathology. -     CT Abdomen Pelvis W Contrast; Future  Prediabetes -     Semaglutide,0.25 or 0.5MG /DOS, (OZEMPIC, 0.25 OR 0.5 MG/DOSE,) 2 MG/1.5ML SOPN; Inject 0.1875 mLs (0.25 mg total) into the skin once a week.   I am having [name] start on EPINEPHrine and Ozempic (0.25 or 0.5 MG/DOSE). I am also having him maintain his albuterol, Azelastine-Fluticasone, cholecalciferol, Testosterone, torsemide, montelukast, roflumilast, losartan, Spiriva Respimat, Dulera, albuterol, predniSONE, ezetimibe, ALPRAZolam, omeprazole, and NovoFine.  Meds ordered this encounter  Medications  . EPINEPHrine 0.3 mg/0.3 mL IJ SOAJ injection    Sig: Inject 0.3 mLs (0.3 mg total) into the muscle as needed for anaphylaxis.    Dispense:  2 each    Refill:  2  . Semaglutide,0.25 or 0.5MG /DOS, (OZEMPIC, 0.25 OR 0.5  MG/DOSE,) 2 MG/1.5ML SOPN    Sig: Inject 0.1875 mLs (0.25 mg total) into the skin once a week.    Dispense:  2 mL    Refill:  0    I spent 50 minutes in preparing to see the patient by review of recent labs, imaging and procedures, obtaining and reviewing separately obtained history, communicating with the patient and family or caregiver, ordering medications, tests or procedures, and documenting clinical information in the EHR including the differential Dx, treatment, and any further evaluation and other management of 1. Hyperlipidemia LDL goal <70 2. Gynecomastia, male 3. Abdominal distension 4. Yellow jacket sting allergy 5. Abdominal distension (gaseous) 6. Prediabetes     Follow-up: Return in about 3 months (around 11/08/2020).  Scarlette Calico, MD

## 2020-08-08 NOTE — Telephone Encounter (Signed)
PA for [facility] was denied.

## 2020-08-08 NOTE — Patient Instructions (Signed)
Gout  Gout is a condition that causes painful swelling of the joints. Gout is a type of inflammation of the joints (arthritis). This condition is caused by having too much uric acid in the body. Uric acid is a chemical that forms when the body breaks down substances called purines. Purines are important for building body proteins. When the body has too much uric acid, sharp crystals can form and build up inside the joints. This causes pain and swelling. Gout attacks can happen quickly and may be very painful (acute gout). Over time, the attacks can affect more joints and become more frequent (chronic gout). Gout can also cause uric acid to build up under the skin and inside the kidneys. What are the causes? This condition is caused by too much uric acid in your blood. This can happen because:  Your kidneys do not remove enough uric acid from your blood. This is the most common cause.  Your body makes too much uric acid. This can happen with some cancers and cancer treatments. It can also occur if your body is breaking down too many red blood cells (hemolytic anemia).  You eat too many foods that are high in purines. These foods include organ meats and some seafood. Alcohol, especially beer, is also high in purines. A gout attack may be triggered by trauma or stress. What increases the risk? You are more likely to develop this condition if you:  Have a family history of gout.  Are male and middle-aged.  Are male and have gone through menopause.  Are obese.  Frequently drink alcohol, especially beer.  Are dehydrated.  Lose weight too quickly.  Have an organ transplant.  Have lead poisoning.  Take certain medicines, including aspirin, cyclosporine, diuretics, levodopa, and niacin.  Have kidney disease.  Have a skin condition called psoriasis. What are the signs or symptoms? An attack of acute gout happens quickly. It usually occurs in just one joint. The most common place is  the big toe. Attacks often start at night. Other joints that may be affected include joints of the feet, ankle, knee, fingers, wrist, or elbow. Symptoms of this condition may include:  Severe pain.  Warmth.  Swelling.  Stiffness.  Tenderness. The affected joint may be very painful to touch.  Shiny, red, or purple skin.  Chills and fever. Chronic gout may cause symptoms more frequently. More joints may be involved. You may also have white or yellow lumps (tophi) on your hands or feet or in other areas near your joints. How is this diagnosed? This condition is diagnosed based on your symptoms, medical history, and physical exam. You may have tests, such as:  Blood tests to measure uric acid levels.  Removal of joint fluid with a thin needle (aspiration) to look for uric acid crystals.  X-rays to look for joint damage. How is this treated? Treatment for this condition has two phases: treating an acute attack and preventing future attacks. Acute gout treatment may include medicines to reduce pain and swelling, including:  NSAIDs.  Steroids. These are strong anti-inflammatory medicines that can be taken by mouth (orally) or injected into a joint.  Colchicine. This medicine relieves pain and swelling when it is taken soon after an attack. It can be given by mouth or through an IV. Preventive treatment may include:  Daily use of smaller doses of NSAIDs or colchicine.  Use of a medicine that reduces uric acid levels in your blood.  Changes to your diet. You may   need to see a dietitian about what to eat and drink to prevent gout. Follow these instructions at home: During a gout attack   If directed, put ice on the affected area: ? Put ice in a plastic bag. ? Place a towel between your skin and the bag. ? Leave the ice on for 20 minutes, 2-3 times a day.  Raise (elevate) the affected joint above the level of your heart as often as possible.  Rest the joint as much as possible.  If the affected joint is in your leg, you may be given crutches to use.  Follow instructions from your health care provider about eating or drinking restrictions. Avoiding future gout attacks  Follow a low-purine diet as told by your dietitian or health care provider. Avoid foods and drinks that are high in purines, including liver, kidney, anchovies, asparagus, herring, mushrooms, mussels, and beer.  Maintain a healthy weight or lose weight if you are overweight. If you want to lose weight, talk with your health care provider. It is important that you do not lose weight too quickly.  Start or maintain an exercise program as told by your health care provider. Eating and drinking  Drink enough fluids to keep your urine pale yellow.  If you drink alcohol: ? Limit how much you use to:  0-1 drink a day for women.  0-2 drinks a day for men. ? Be aware of how much alcohol is in your drink. In the [place] one drink equals one 12 oz bottle of beer (355 mL) one 5 oz glass of wine (148 mL), or one 1 oz glass of hard liquor (44 mL). General instructions  Take over-the-counter and prescription medicines only as told by your health care provider.  Do not drive or use heavy machinery while taking prescription pain medicine.  Return to your normal activities as told by your health care provider. Ask your health care provider what activities are safe for you.  Keep all follow-up visits as told by your health care provider. This is important. Contact a health care provider if you have:  Another gout attack.  Continuing symptoms of a gout attack after 10 days of treatment.  Side effects from your medicines.  Chills or a fever.  Burning pain when you urinate.  Pain in your lower back or belly. Get help right away if you:  Have severe or uncontrolled pain.  Cannot urinate. Summary  Gout is painful swelling of the joints caused by inflammation.  The most common site of pain is the big  toe, but it can affect other joints in the body.  Medicines and dietary changes can help to prevent and treat gout attacks. This information is not intended to replace advice given to you by your health care provider. Make sure you discuss any questions you have with your health care provider. Document Revised: 06/29/2018 Document Reviewed: 06/29/2018 Elsevier Patient Education  2020 [employer]  

## 2020-08-09 LAB — TEST AUTHORIZATION

## 2020-08-09 LAB — HEPATITIS C ANTIBODY
Hepatitis C Ab: NONREACTIVE
SIGNAL TO CUT-OFF: 0.01 (ref <1.00)

## 2020-08-09 LAB — HEPATIC FUNCTION PANEL
AG Ratio: 1.7 (calc) (ref 1.0–2.5)
ALT: 31 U/L (ref 9–46)
AST: 25 U/L (ref 10–35)
Albumin: 4 g/dL (ref 3.6–5.1)
Alkaline phosphatase (APISO): 68 U/L (ref 35–144)
Bilirubin, Direct: 0.2 mg/dL (ref 0.0–0.2)
Globulin: 2.3 g/dL (calc) (ref 1.9–3.7)
Indirect Bilirubin: 0.7 mg/dL (calc) (ref 0.2–1.2)
Total Bilirubin: 0.9 mg/dL (ref 0.2–1.2)
Total Protein: 6.3 g/dL (ref 6.1–8.1)

## 2020-08-09 LAB — SEDIMENTATION RATE: Sed Rate: 2 mm/h (ref 0–20)

## 2020-08-09 LAB — HIV ANTIBODY (ROUTINE TESTING W REFLEX): HIV 1&2 Ab, 4th Generation: NONREACTIVE

## 2020-08-09 LAB — URIC ACID: Uric Acid, Serum: 8.2 mg/dL — ABNORMAL HIGH (ref 4.0–8.0)

## 2020-08-09 NOTE — Telephone Encounter (Signed)
Pt contacted and informed dermatology referral has been entered.

## 2020-08-09 NOTE — Telephone Encounter (Signed)
New Message:   Pt is calling and states he wants to make sure his referral to the dermatologist him and Dr. Ronnald Ramp were discussing yesterday has been placed. Pt would like a call when it has been put in. Please advise.

## 2020-08-09 NOTE — Addendum Note (Signed)
Addended by: Karle Barr on: 08/09/2020 03:26 PM   Modules accepted: Orders

## 2020-08-15 ENCOUNTER — Other Ambulatory Visit: Payer: 59

## 2020-08-16 NOTE — Telephone Encounter (Signed)
Hey Dr Lyndel Safe, pt's referring provider is checking on status of appt

## 2020-08-16 NOTE — Telephone Encounter (Signed)
Have reviewed the records previously. [facility] is out of network for him  He needs to have office visit first  Please set him up for office visit with me or [place].  RG

## 2020-08-19 ENCOUNTER — Telehealth: Payer: Self-pay | Admitting: Emergency Medicine

## 2020-08-19 MED ORDER — DULERA 200-5 MCG/ACT IN AERO: INHALATION_SPRAY | RESPIRATORY_TRACT | 11 refills | Status: DC

## 2020-08-19 MED ORDER — SPIRIVA RESPIMAT 2.5 MCG/ACT IN AERS: 2.5000 ug | INHALATION_SPRAY | Freq: Two times a day (BID) | RESPIRATORY_TRACT | 11 refills | Status: DC

## 2020-08-19 NOTE — Telephone Encounter (Signed)
Pt also  Needs refill of Spiriva-pr

## 2020-08-19 NOTE — Telephone Encounter (Signed)
I have sent refills on Dulera and Spiriva  Pt aware  Nothing further needed

## 2020-08-20 NOTE — Telephone Encounter (Signed)
Left msg on pt's vm to call back 

## 2020-08-22 ENCOUNTER — Telehealth: Payer: Self-pay | Admitting: Internal Medicine

## 2020-08-22 NOTE — Telephone Encounter (Signed)
Patient called and stated he would like a call back about his feet and how they swell, He states he has some questions about his care for insurance reasons and a form on how they pay for things.

## 2020-08-22 NOTE — Telephone Encounter (Signed)
I did not understand what he was trying to tell me.

## 2020-08-23 NOTE — Telephone Encounter (Signed)
FYI:  I called pt in regard to the form. He stated a few years ago he took out a loan and paid for the the optional disability/out of work insurance that [employer] offed that  would cover his monthly payments if he found himself in a situation that would prevent him from making payments. He stated he was seen a month ago in regard to his feet swelling bad. He states that they are preventing him from working and that he has an upcoming appoint with vascular and vein. He would like for the forms to be filled out by PCP for the loan company to cover his loan payments until he "get straighten out". Pt stated that he is waiting to receive the forms in the mail and will have someone drop them off next week for PCP to review.

## 2020-08-27 ENCOUNTER — Inpatient Hospital Stay: Admission: RE | Admit: 2020-08-27 | Payer: 59 | Source: Ambulatory Visit

## 2020-09-03 ENCOUNTER — Encounter (HOSPITAL_COMMUNITY): Payer: 59

## 2020-09-04 ENCOUNTER — Ambulatory Visit: Payer: 59 | Admitting: Vascular Surgery

## 2020-09-05 DIAGNOSIS — Z0279 Encounter for issue of other medical certificate: Secondary | ICD-10-CM

## 2020-09-05 NOTE — Telephone Encounter (Signed)
Spoke with patient.   Forms have been completed &signed, Copy sent to scan, &Charged for.   Patient needs to completed front page and he will come by and pick up the forms.

## 2020-09-05 NOTE — Telephone Encounter (Signed)
LVM for patient to call back and go over the form.

## 2020-09-09 ENCOUNTER — Other Ambulatory Visit: Payer: 59

## 2020-09-12 ENCOUNTER — Telehealth: Payer: Self-pay | Admitting: Internal Medicine

## 2020-09-12 ENCOUNTER — Ambulatory Visit
Admission: RE | Admit: 2020-09-12 | Discharge: 2020-09-12 | Disposition: A | Discharge status: Home / Self Care | Payer: 59 | Source: Ambulatory Visit | Attending: Internal Medicine | Admitting: Internal Medicine

## 2020-09-12 DIAGNOSIS — R140 Abdominal distension (gaseous): Secondary | ICD-10-CM

## 2020-09-12 MED ORDER — IOPAMIDOL (ISOVUE-300) INJECTION 61%
100.0000 mL | Freq: Once | INTRAVENOUS | Status: AC | PRN
  Administered 2020-09-12: 100 mL via INTRAVENOUS

## 2020-09-12 MED ORDER — MONTELUKAST SODIUM 10 MG PO TABS
10.0000 mg | ORAL_TABLET | Freq: Every day | ORAL | 2 refills | Status: DC
Start: 2020-09-12 — End: 2021-02-06

## 2020-09-12 NOTE — Telephone Encounter (Signed)
Patient called and was requesting a medication refill for montelukast (SINGULAIR) 10 MG tablet  Can be sent to [facility], [place] [address] AT [place]  Please call pt back:[phone]

## 2020-09-13 ENCOUNTER — Other Ambulatory Visit: Payer: Self-pay

## 2020-09-13 ENCOUNTER — Ambulatory Visit (INDEPENDENT_AMBULATORY_CARE_PROVIDER_SITE_OTHER): Payer: 59 | Admitting: Emergency Medicine

## 2020-09-13 ENCOUNTER — Encounter: Payer: Self-pay | Admitting: Emergency Medicine

## 2020-09-13 DIAGNOSIS — J449 Chronic obstructive pulmonary disease, unspecified: Secondary | ICD-10-CM | POA: Diagnosis not present

## 2020-09-13 NOTE — Progress Notes (Signed)
Patient her to reestablish care  Subjective:  Patient ID: Robert Church, male    DOB: December 07, 1956  Age: 64 y.o. MRN: [id number]  CC:  Chief Complaint  Patient presents with  . Follow-up    productive cough increased    HPI  ROV 09/13/20 -- 64 year old active smoker with severe COPD and a chronic bronchitic phenotype with purulent sputum production.  He has been managed on Spiriva, Dulera and daily prednisone 5 mg daily, Daliresp.  He uses albuterol.  Due for lung cancer screening CT in March 2022. He reports continued cough, thick clear mucous. A little sinus clear mucous but "not bad".  When he has fits of coughing he becomes dyspneic. Gets waked up at night to cough of mucous some times. He took levaquin in August, benefited from it. He remains on astelin/flonase nasal spray, mucinex, singulair. Has allergies to multiple abx.    ROS Review of Systems  Constitutional: Negative for activity change, fatigue, fever and unexpected weight change.  HENT: Negative for congestion, postnasal drip, rhinorrhea and sinus pain.   Eyes: Negative for redness.  Respiratory: Positive for cough, shortness of breath and wheezing. Negative for apnea, choking, chest tightness and stridor.   Cardiovascular: Negative for chest pain, palpitations and leg swelling.  Gastrointestinal: Positive for abdominal distention. Negative for abdominal pain, diarrhea and nausea.  Endocrine: Negative for polyuria.  Genitourinary: Negative for dysuria and hematuria.  Musculoskeletal: Negative for back pain, gait problem and myalgias.  Skin: Negative for rash and wound.  Neurological: Negative for dizziness, seizures, weakness and headaches.  Psychiatric/Behavioral: Negative for agitation and self-injury.    Objective:   Today's Vitals: BP 132/78 (BP Location: Right Arm, Cuff Size: Normal)   Pulse (!) 49   Temp 98.2 F (36.8 C) (Oral)   Ht 5\' 10"  (1.778 m)   Wt 247 lb (112 kg)   SpO2 94%   BMI 35.44 kg/m    Gen: Pleasant, well-nourished, in no distress,  normal affect  ENT: No lesions,  mouth clear,  oropharynx clear, no postnasal drip  Neck: No JVD, no stridor  Lungs: No use of accessory muscles, no crackles, he does have some exp wheeze, low pitched.   Cardiovascular: RRR, heart sounds normal, no murmur or gallops, no peripheral edema  Musculoskeletal: No deformities, no cyanosis or clubbing  Neuro: alert, awake, non focal  Skin: Warm, no lesions or rash   Assessment & Plan:  COPD, group D, by GOLD 2017 classification ([place]) Labile symptoms.  More mucus, more dyspnea although the mucus is clear.  He may have benefited from [place] in August at least transiently.  Considered initiating cycled antibiotics but he has allergies to several.  Discussed possibly going up on his maintenance prednisone dose.  For now he wants to stay on 5 mg daily.  We will continue this, continue his bronchodilator regimen, his allergy regimen, Daliresp.  Suspect that ultimately we will have to go up on the daily prednisone.  Talked him about smoking cessation today  Please continue Spiriva and Dulera as you have been taking them.  Rinse and gargle after the [facility] Keep albuterol available to use up to every 4 hours if needed for shortness of breath, chest tightness, wheezing. Continue prednisone 5 mg daily Continue Daliresp Continue Singulair, Astelin/fluticasone nasal spray, Mucinex as you have been taking them Need to stop smoking altogether You benefit from the flu shot or the COVID-19 vaccines.  Let us know if you change your mind about getting these.  Follow with Dr Lamonte Sakai in 3 months or sooner if you have any problems.    Baltazar Apo, MD, PhD 09/13/2020, 2:29 PM [facility] [phone] or if no answer [phone]

## 2020-09-13 NOTE — Assessment & Plan Note (Signed)
Labile symptoms.  More mucus, more dyspnea although the mucus is clear.  He may have benefited from [place] in August at least transiently.  Considered initiating cycled antibiotics but he has allergies to several.  Discussed possibly going up on his maintenance prednisone dose.  For now he wants to stay on 5 mg daily.  We will continue this, continue his bronchodilator regimen, his allergy regimen, Daliresp.  Suspect that ultimately we will have to go up on the daily prednisone.  Talked him about smoking cessation today  Please continue Spiriva and Dulera as you have been taking them.  Rinse and gargle after the [facility] Keep albuterol available to use up to every 4 hours if needed for shortness of breath, chest tightness, wheezing. Continue prednisone 5 mg daily Continue Daliresp Continue Singulair, Astelin/fluticasone nasal spray, Mucinex as you have been taking them Need to stop smoking altogether You benefit from the flu shot or the COVID-19 vaccines.  Let us know if you change your mind about getting these. Follow with Dr Lamonte Sakai in 3 months or sooner if you have any problems.

## 2020-09-13 NOTE — Patient Instructions (Signed)
Please continue Spiriva and Dulera as you have been taking them.  Rinse and gargle after the [facility] Keep albuterol available to use up to every 4 hours if needed for shortness of breath, chest tightness, wheezing. Continue prednisone 5 mg daily Continue Daliresp Continue Singulair, Astelin/fluticasone nasal spray, Mucinex as you have been taking them Need to stop smoking altogether You benefit from the flu shot or the COVID-19 vaccines.  Let us know if you change your mind about getting these. Follow with Dr Lamonte Sakai in 3 months or sooner if you have any problems.

## 2020-09-17 ENCOUNTER — Telehealth: Payer: Self-pay | Admitting: Internal Medicine

## 2020-09-17 NOTE — Telephone Encounter (Signed)
    Please return call to patient to discuss imaging results

## 2020-09-18 ENCOUNTER — Other Ambulatory Visit: Payer: Self-pay | Admitting: Cardiovascular Disease

## 2020-09-18 ENCOUNTER — Encounter: Payer: Self-pay | Admitting: Internal Medicine

## 2020-09-18 ENCOUNTER — Other Ambulatory Visit: Payer: Self-pay

## 2020-09-18 DIAGNOSIS — R600 Localized edema: Secondary | ICD-10-CM

## 2020-09-18 DIAGNOSIS — I872 Venous insufficiency (chronic) (peripheral): Secondary | ICD-10-CM

## 2020-09-18 MED ORDER — LOSARTAN POTASSIUM 100 MG PO TABS: 100.0000 mg | ORAL_TABLET | Freq: Every day | ORAL | 1 refills | Status: DC

## 2020-09-18 NOTE — Telephone Encounter (Signed)
*  STAT* If patient is at the pharmacy, call can be transferred to refill team.   1. Which medications need to be refilled? (please list name of each medication and dose if known) losartan (COZAAR) 100 MG tablet   2. Which pharmacy/location (including street and city if local pharmacy) is medication to be sent to? [facility], [place] - [address] AT [place]  3. Do they need a 30 day or 90 day supply? 90 day

## 2020-09-19 ENCOUNTER — Telehealth (INDEPENDENT_AMBULATORY_CARE_PROVIDER_SITE_OTHER): Payer: 59 | Admitting: Physician Assistant

## 2020-09-19 ENCOUNTER — Telehealth: Payer: Self-pay

## 2020-09-19 ENCOUNTER — Encounter: Payer: Self-pay | Admitting: Physician Assistant

## 2020-09-19 VITALS — BP 160/91 | HR 90 | Ht 70.0 in | Wt 237.0 lb

## 2020-09-19 DIAGNOSIS — I10 Essential (primary) hypertension: Secondary | ICD-10-CM | POA: Diagnosis not present

## 2020-09-19 DIAGNOSIS — G4733 Obstructive sleep apnea (adult) (pediatric): Secondary | ICD-10-CM

## 2020-09-19 DIAGNOSIS — I428 Other cardiomyopathies: Secondary | ICD-10-CM

## 2020-09-19 MED ORDER — HYDRALAZINE HCL 10 MG PO TABS: 10.0000 mg | ORAL_TABLET | Freq: Two times a day (BID) | ORAL | 3 refills | Status: DC

## 2020-09-19 NOTE — Patient Instructions (Addendum)
Medication Instructions:   START Hydralazine 10 mg 2 times a day *If you need a refill on your cardiac medications before your next appointment, please call your pharmacy*  Lab Work: NONE ordered at this time of appointment   If you have labs (blood work) drawn today and your tests are completely normal, you will receive your results only by: Marland Kitchen MyChart Message (if you have MyChart) OR . A paper copy in the mail If you have any lab test that is abnormal or we need to change your treatment, we will call you to review the results.  Testing/Procedures: NONE ordered at this time of appointment   Follow-Up: At [facility], you and your health needs are our priority.  As part of our continuing mission to provide you with exceptional heart care, we have created designated Provider Care Teams.  These Care Teams include your primary Cardiologist (physician) and Advanced Practice Providers (APPs -  Physician Assistants and Nurse Practitioners) who all work together to provide you with the care you need, when you need it.  We recommend signing up for the patient portal called "MyChart".  Sign up information is provided on this After Visit Summary.  MyChart is used to connect with patients for Virtual Visits (Telemedicine).  Patients are able to view lab/test results, encounter notes, upcoming appointments, etc.  Non-urgent messages can be sent to your provider as well.   To learn more about what you can do with MyChart, go to [email or web address].    Your next appointment:   3-4 month(s)  The format for your next appointment:   In Person  Provider:   Quay Burow, MD  Other Instructions

## 2020-09-19 NOTE — Progress Notes (Signed)
Virtual Visit via Telephone Note   This visit type was conducted due to national recommendations for restrictions regarding the COVID-19 Pandemic (e.g. social distancing) in an effort to limit this patient's exposure and mitigate transmission in our community.  Due to his co-morbid illnesses, this patient is at least at moderate risk for complications without adequate follow up.  This format is felt to be most appropriate for this patient at this time.  The patient did not have access to video technology/had technical difficulties with video requiring transitioning to audio format only (telephone).  All issues noted in this document were discussed and addressed.  No physical exam could be performed with this format.  Please refer to the patient's chart for his  consent to telehealth for [facility].    Date:  09/19/2020   ID:  Robert Church, DOB June 12, 1956, MRN [id number] The patient was identified using 2 identifiers.  Patient Location: Home Provider Location: Office/Clinic  PCP:  Janith Lima, MD  Cardiologist:  Quay Burow, MD  Electrophysiologist:  None   Evaluation Performed:  Follow-Up Visit  Chief Complaint:  Follow up   History of Present Illness:    Robert Church is a 64 y.o. male with past medical history of hypertension, tobacco abuse, COPD, OSA, NICM and history of chest pain.  He had a cardiac catheterization by Dr. Tamala Julian in April 2018 that showed normal coronary arteries, EF 40% consistent with nonischemic cardiomyopathy likely related to history of hypertension.  Echocardiogram performed on 07/07/2018 showed EF 30 to 35%.  He was then referred to Dr. Haroldine Laws of heart failure clinic who switched his carvedilol to the better and the losartan to [facility].  Unfortunately he did not tolerate Entresto and he was changed back to losartan.  Repeat echocardiogram obtained in December 2020 showed EF improved to 45%.  He was placed on Entresto again as another trial,  unfortunately he lost his job in June and was unable to afford it.  He was placed back on losartan.  He was recently seen by [name] on 09/13/2020 at which time he was stable.  He was maintained on Spiriva, Dulera and prednisone.  Patient presents today for follow-up.  His systolic blood pressure has been ranging between 130-160s, he was previously on 50 mg daily of spironolactone, this was stopped in July for unknown reason.  He says he does have leg cramps and he does not wish to try any medication that may worsen his hip.  Currently, his only blood pressure medication is 100 mg losartan.  Since losartan is already at maximum dose, I am unable to further uptitrate it.  I plan to bring the patient back in 3 to 4 months for reassessment of his blood pressure.  He also has question regarding his CPAP machine.  He had the current CPAP machine for the past 3 years, last sleep study was over a year ago.  He has not used his CPAP machine for the past year as he does not have anybody to manage it.  I will speak with Mariann Laster our sleep coordinator.  The patient does not have symptoms concerning for COVID-19 infection (fever, chills, cough, or new shortness of breath).    Past Medical History:  Diagnosis Date  . Allergy   . Asthma   . Bronchitis   . COPD (chronic obstructive pulmonary disease) ([place])   . GERD (gastroesophageal reflux disease)   . Hypertension   . Pneumonia   . Sinusitis   .  SOB (shortness of breath)    Past Surgical History:  Procedure Laterality Date  . LEFT HEART CATH AND CORONARY ANGIOGRAPHY N/A 04/19/2017   Procedure: Left Heart Cath and Coronary Angiography;  Surgeon: Belva Crome, MD;  Location: [facility];  Service: Cardiovascular;  Laterality: N/A;  . TONSILLECTOMY       Current Meds  Medication Sig  . albuterol (PROVENTIL) (2.5 MG/3ML) 0.083% nebulizer solution Take 3 mLs (2.5 mg total) by nebulization every 6 (six) hours as needed for wheezing or shortness of  breath.  Marland Kitchen albuterol (VENTOLIN HFA) 108 (90 Base) MCG/ACT inhaler Inhale 2 puffs into the lungs every 6 (six) hours as needed for wheezing.  Marland Kitchen allopurinol (ZYLOPRIM) 300 MG tablet Take 1 tablet (300 mg total) by mouth daily.  Marland Kitchen ALPRAZolam (XANAX) 1 MG tablet Take 1 tablet (1 mg total) by mouth 2 (two) times daily as needed for anxiety.  . Azelastine-Fluticasone 137-50 MCG/ACT SUSP 1 spray each nostril twice daily  . cholecalciferol (VITAMIN D) 1000 units tablet Take 1,000 Units by mouth daily.  . Colchicine (MITIGARE) 0.6 MG CAPS Take 1 tablet by mouth 2 (two) times daily.  Marland Kitchen EPINEPHrine 0.3 mg/0.3 mL IJ SOAJ injection Inject 0.3 mLs (0.3 mg total) into the muscle as needed for anaphylaxis.  Marland Kitchen ezetimibe (ZETIA) 10 MG tablet Take 1 tablet (10 mg total) by mouth daily.  . Insulin Pen Needle (NOVOFINE) 32G X 6 MM MISC 1 Act by Does not apply route once a week.  . losartan (COZAAR) 100 MG tablet Take 1 tablet (100 mg total) by mouth daily.  . mometasone-formoterol (DULERA) 200-5 MCG/ACT AERO Take 2 puffs first thing in am and then another 2 puffs about 12 hours later.  . montelukast (SINGULAIR) 10 MG tablet Take 1 tablet (10 mg total) by mouth at bedtime.  Marland Kitchen omeprazole (PRILOSEC) 20 MG capsule Take 1 capsule (20 mg total) by mouth 2 (two) times daily before a meal.  . predniSONE (DELTASONE) 10 MG tablet Take 0.5 tablets (5 mg total) by mouth daily with breakfast. Pt taking 1/2 tablet  . roflumilast (DALIRESP) 500 MCG TABS tablet Take 1 tablet (500 mcg total) by mouth daily.  . Semaglutide,0.25 or 0.5MG /DOS, (OZEMPIC, 0.25 OR 0.5 MG/DOSE,) 2 MG/1.5ML SOPN Inject 0.1875 mLs (0.25 mg total) into the skin once a week.  . Tiotropium Bromide Monohydrate (SPIRIVA RESPIMAT) 2.5 MCG/ACT AERS Inhale 2.5 mcg into the lungs 2 (two) times daily.  Marland Kitchen torsemide (DEMADEX) 20 MG tablet Take 2 tablets (40 mg total) by mouth daily.     Allergies:   Clindamycin, Doxycycline, E-mycin [erythromycin base], Penicillins, and  Rosuvastatin   Social History   Tobacco Use  . Smoking status: Current Every Day Smoker    Packs/day: 0.25    Years: 46.00    Pack years: 11.50    Types: Cigarettes  . Smokeless tobacco: Never Used  . Tobacco comment: 3 cigarettes smoked daily 09/13/20 [facility]   Vaping Use  . Vaping Use: Never used  Substance Use Topics  . Alcohol use: Yes    Alcohol/week: 0.0 standard drinks  . Drug use: No     Family Hx: The patient's family history includes Brain cancer in his father; Lung cancer in his father.  ROS:   Please see the history of present illness.     All other systems reviewed and are negative.   Prior CV studies:   The following studies were reviewed today:  Echo 11/29/2019 1. Left ventricular ejection  fraction, by 3D EF is 45%. The left  ventricle has mild to moderately decreased function. Moderately increased  left ventricular posterior wall thickness. There is mildly increased left  ventricular hypertrophy.  2. Abnormal septal motion consistent with interventricular conduction  delay.  3. Elevated left ventricular end-diastolic pressure.  4. Global right ventricle has mildly reduced systolic function.The right  ventricular size is normal. No increase in right ventricular wall  thickness.  5. Left atrial size was normal.  6. Right atrial size was normal.  7. The mitral valve is normal in structure. No evidence of mitral valve  regurgitation. No evidence of mitral stenosis.  8. The tricuspid valve is normal in structure. Tricuspid valve  regurgitation is trivial.  9. The aortic valve is tricuspid. Aortic valve regurgitation is not  visualized. Mild aortic valve sclerosis without stenosis.  10. The pulmonic valve was normal in structure. Pulmonic valve  regurgitation is trivial.  11. Normal pulmonary artery systolic pressure.  12. The inferior vena cava is normal in size with greater than 50%  respiratory variability, suggesting right atrial pressure of 3  mmHg.  13. The left ventricle demonstrates global hypokinesis.    Labs/Other Tests and Data Reviewed:    EKG:  An ECG dated 11/20/2019 was personally reviewed today and demonstrated:  Sinus rhythm with right bundle branch block.  Recent Labs: 12/06/2019: NT-Pro BNP 448 01/11/2020: B Natriuretic Peptide 267.3 07/10/2020: BUN 18; Creat 1.04; Hemoglobin 18.2; Platelets 181; Potassium 3.9; Sodium 143; TSH 0.61 08/07/2020: ALT 31   Recent Lipid Panel Lab Results  Component Value Date/Time   CHOL 177 07/10/2020 10:35 AM   TRIG 150 (H) 07/10/2020 10:35 AM   HDL 43 07/10/2020 10:35 AM   CHOLHDL 4.1 07/10/2020 10:35 AM   LDLCALC 108 (H) 07/10/2020 10:35 AM    Wt Readings from Last 3 Encounters:  09/19/20 237 lb (107.5 kg)  09/13/20 247 lb (112 kg)  08/08/20 242 lb (109.8 kg)     Objective:    Vital Signs:  BP (!) 160/91   Pulse 90   Ht 5\' 10"  (1.778 m)   Wt 237 lb (107.5 kg)   BMI 34.01 kg/m   168/98 hr 82 139/89   VITAL SIGNS:  reviewed  ASSESSMENT & PLAN:    1. Nonischemic cardiomyopathy: The only heart failure medication he is on losartan.  He was previously on spironolactone, however this was discontinued for unknown reason.  He says he has a lot of leg cramps and he does not wish to try any medication that may worsen it.  I recommended a low-dose hydralazine 10 mg twice a day.  I plan to bring the patient back in 3 months for reassessment.  2. Hypertension: Systolic blood pressure between 130-160s.  Add low-dose hydralazine.  May consider addition of low-dose nitrate on the next follow-up for his cardiomyopathy.  3. Obstructive sleep apnea: We will need to discuss with our sleep study coordinator, patient has a CPAP machine from 3 years ago, last sleep study was over a year ago.  I likely will set him up with Dr. Claiborne Billings to help manage his CPAP therapy.  COVID-19 Education: The signs and symptoms of COVID-19 were discussed with the patient and how to seek care for testing  (follow up with PCP or arrange E-visit).  The importance of social distancing was discussed today.  Time:   Today, I have spent 15 minutes with the patient with telehealth technology discussing the above problems.     Medication  Adjustments/Labs and Tests Ordered: Current medicines are reviewed at length with the patient today.  Concerns regarding medicines are outlined above.   Tests Ordered: No orders of the defined types were placed in this encounter.   Medication Changes: No orders of the defined types were placed in this encounter.   Follow Up:  In Person in 3 month(s)  Signed, Almyra Deforest, [place]  09/19/2020 11:12 AM    [place]

## 2020-09-19 NOTE — Telephone Encounter (Signed)
Called patient to discuss AVS instructions gave Robert Church's recommendations and to start Hydralazine 10 mg 2 times a day. Patient voiced understanding. AVS summary mailed to patient.

## 2020-09-20 ENCOUNTER — Telehealth: Payer: Self-pay | Admitting: Internal Medicine

## 2020-09-20 NOTE — Telephone Encounter (Signed)
Patient's mother called and was wondering if patient could get a referral to [facility], she said that the patient has bad sleep apnea.    Her name is Jasiel Belisle she can be reached at [phone].

## 2020-09-23 ENCOUNTER — Other Ambulatory Visit: Payer: Self-pay | Admitting: Internal Medicine

## 2020-09-23 DIAGNOSIS — G4733 Obstructive sleep apnea (adult) (pediatric): Secondary | ICD-10-CM

## 2020-09-24 ENCOUNTER — Ambulatory Visit (INDEPENDENT_AMBULATORY_CARE_PROVIDER_SITE_OTHER): Payer: 59

## 2020-09-24 ENCOUNTER — Telehealth: Payer: Self-pay

## 2020-09-24 ENCOUNTER — Ambulatory Visit (INDEPENDENT_AMBULATORY_CARE_PROVIDER_SITE_OTHER): Payer: 59 | Admitting: Internal Medicine

## 2020-09-24 ENCOUNTER — Other Ambulatory Visit: Payer: Self-pay

## 2020-09-24 ENCOUNTER — Encounter: Payer: Self-pay | Admitting: Internal Medicine

## 2020-09-24 VITALS — BP 132/76 | HR 71 | Temp 97.8°F | Resp 16 | Ht 70.0 in | Wt 247.0 lb

## 2020-09-24 DIAGNOSIS — M170 Bilateral primary osteoarthritis of knee: Secondary | ICD-10-CM | POA: Diagnosis not present

## 2020-09-24 DIAGNOSIS — S0081XA Abrasion of other part of head, initial encounter: Secondary | ICD-10-CM

## 2020-09-24 DIAGNOSIS — S99921A Unspecified injury of right foot, initial encounter: Secondary | ICD-10-CM | POA: Diagnosis not present

## 2020-09-24 DIAGNOSIS — S8991XA Unspecified injury of right lower leg, initial encounter: Secondary | ICD-10-CM | POA: Diagnosis not present

## 2020-09-24 MED ORDER — TRAMADOL HCL 50 MG PO TABS: 50.0000 mg | ORAL_TABLET | Freq: Four times a day (QID) | ORAL | 2 refills | Status: AC | PRN

## 2020-09-24 NOTE — Patient Instructions (Signed)

## 2020-09-24 NOTE — Telephone Encounter (Signed)
Called and left a detailed message for the patient informing him that I had to rescheduled his appointment with Dr. Claiborne Billings for Sleep from 01/13/21 to 11/27/20 at 2:00PM and that he can bring his CPAP machine. I also stated that if the new appointment date and time does not work for him to give our office a call and someone will be glad to assist him.

## 2020-09-24 NOTE — Progress Notes (Signed)
Subjective:  Patient ID: Robert Church, male    DOB: Aug 15, 1956  Age: 64 y.o. MRN: [id number]  CC: Osteoarthritis  This visit occurred during the SARS-CoV-2 public health emergency.  Safety protocols were in place, including screening questions prior to the visit, additional usage of staff PPE, and extensive cleaning of exam room while observing appropriate contact time as indicated for disinfecting solutions.    HPI Robert Church presents for f/u - He has chronic, bilateral knee pain but tells me that 5 days ago he was feeding feral cats at his house when he fell and injured his right knee, right foot, and an area between his eyebrows.  He says he hit his forehead on a kitchen cabinet.  He sustained a small abrasion but did not lose consciousness.  He denies headache, nausea, vomiting, neck pain, back pain, paresthesias, or ataxia.  He complains of pain and swelling in the right foot and the right knee.  He is requesting something for pain.  Outpatient Medications Prior to Visit  Medication Sig Dispense Refill  . albuterol (PROVENTIL) (2.5 MG/3ML) 0.083% nebulizer solution Take 3 mLs (2.5 mg total) by nebulization every 6 (six) hours as needed for wheezing or shortness of breath. 75 mL 12  . albuterol (VENTOLIN HFA) 108 (90 Base) MCG/ACT inhaler Inhale 2 puffs into the lungs every 6 (six) hours as needed for wheezing. 18 g 5  . allopurinol (ZYLOPRIM) 300 MG tablet Take 1 tablet (300 mg total) by mouth daily. 30 tablet 5  . ALPRAZolam (XANAX) 1 MG tablet Take 1 tablet (1 mg total) by mouth 2 (two) times daily as needed for anxiety. 180 tablet 0  . Azelastine-Fluticasone 137-50 MCG/ACT SUSP 1 spray each nostril twice daily 23 g 6  . cholecalciferol (VITAMIN D) 1000 units tablet Take 1,000 Units by mouth daily.    . Colchicine (MITIGARE) 0.6 MG CAPS Take 1 tablet by mouth 2 (two) times daily. 60 capsule 5  . EPINEPHrine 0.3 mg/0.3 mL IJ SOAJ injection Inject 0.3 mLs (0.3 mg  total) into the muscle as needed for anaphylaxis. 2 each 2  . ezetimibe (ZETIA) 10 MG tablet Take 1 tablet (10 mg total) by mouth daily. 90 tablet 1  . hydrALAZINE (APRESOLINE) 10 MG tablet Take 1 tablet (10 mg total) by mouth in the morning and at bedtime. 180 tablet 3  . Insulin Pen Needle (NOVOFINE) 32G X 6 MM MISC 1 Act by Does not apply route once a week. 50 each 0  . losartan (COZAAR) 100 MG tablet Take 1 tablet (100 mg total) by mouth daily. 90 tablet 1  . mometasone-formoterol (DULERA) 200-5 MCG/ACT AERO Take 2 puffs first thing in am and then another 2 puffs about 12 hours later. 13 g 11  . montelukast (SINGULAIR) 10 MG tablet Take 1 tablet (10 mg total) by mouth at bedtime. 30 tablet 2  . omeprazole (PRILOSEC) 20 MG capsule Take 1 capsule (20 mg total) by mouth 2 (two) times daily before a meal. 180 capsule 1  . predniSONE (DELTASONE) 10 MG tablet Take 0.5 tablets (5 mg total) by mouth daily with breakfast. Pt taking 1/2 tablet 30 tablet 2  . roflumilast (DALIRESP) 500 MCG TABS tablet Take 1 tablet (500 mcg total) by mouth daily. 30 tablet 3  . Semaglutide,0.25 or 0.5MG /DOS, (OZEMPIC, 0.25 OR 0.5 MG/DOSE,) 2 MG/1.5ML SOPN Inject 0.1875 mLs (0.25 mg total) into the skin once a week. 2 mL 0  .  Tiotropium Bromide Monohydrate (SPIRIVA RESPIMAT) 2.5 MCG/ACT AERS Inhale 2.5 mcg into the lungs 2 (two) times daily. 4 g 11  . torsemide (DEMADEX) 20 MG tablet Take 2 tablets (40 mg total) by mouth daily. 180 tablet 3   No facility-administered medications prior to visit.    ROS Review of Systems  Constitutional: Negative.  Negative for appetite change, diaphoresis and fatigue.  HENT: Negative.   Eyes: Negative.   Respiratory: Negative for cough, chest tightness, shortness of breath and wheezing.   Cardiovascular: Positive for leg swelling. Negative for chest pain and palpitations.  Gastrointestinal: Negative for abdominal pain, blood in stool, constipation, diarrhea, nausea and vomiting.    Endocrine: Negative.   Genitourinary: Negative.  Negative for difficulty urinating.  Musculoskeletal: Positive for arthralgias. Negative for myalgias and neck pain.  Skin: Negative.   Neurological: Negative for dizziness, tremors, seizures, syncope, facial asymmetry, speech difficulty, weakness, light-headedness, numbness and headaches.  Hematological: Negative for adenopathy. Does not bruise/bleed easily.  Psychiatric/Behavioral: The patient is nervous/anxious.     Objective:  BP 132/76   Pulse 71   Temp 97.8 F (36.6 C) (Oral)   Resp 16   Ht 5\' 10"  (1.778 m)   Wt 247 lb (112 kg)   SpO2 91%   BMI 35.44 kg/m   BP Readings from Last 3 Encounters:  09/24/20 132/76  09/19/20 (!) 160/91  09/13/20 132/78    Wt Readings from Last 3 Encounters:  09/24/20 247 lb (112 kg)  09/19/20 237 lb (107.5 kg)  09/13/20 247 lb (112 kg)    Physical Exam Vitals reviewed.  Constitutional:      Appearance: He is obese.    HENT:     Head: Abrasion present. No raccoon eyes, Battle's sign, contusion, masses or laceration.     Jaw: No swelling.     Right Ear: No hemotympanum.     Left Ear: No hemotympanum.     Mouth/Throat:     Mouth: Mucous membranes are moist.  Eyes:     Conjunctiva/sclera: Conjunctivae normal.  Cardiovascular:     Rate and Rhythm: Normal rate and regular rhythm.     Heart sounds: No murmur heard.   Pulmonary:     Effort: Pulmonary effort is normal.     Breath sounds: No stridor. No wheezing, rhonchi or rales.  Abdominal:     General: Abdomen is protuberant. Bowel sounds are normal. There is no distension.     Palpations: Abdomen is soft. There is no hepatomegaly, splenomegaly or mass.     Tenderness: There is no abdominal tenderness.  Musculoskeletal:        General: Normal range of motion.     Cervical back: Neck supple.     Right knee: Swelling and effusion present. No bony tenderness or crepitus. Normal range of motion. Tenderness present. No medial joint  line tenderness. Normal alignment and normal meniscus.     Left knee: Normal. No swelling.     Right lower leg: Edema (trace) present.     Left lower leg: Edema (trace) present.     Right foot: Normal range of motion and normal capillary refill. Swelling and tenderness present. No deformity or bony tenderness. Normal pulse.     Left foot: Normal range of motion. Swelling present. No deformity or tenderness.  Lymphadenopathy:     Cervical: No cervical adenopathy.  Neurological:     General: No focal deficit present.     Mental Status: He is alert and oriented to person, place,  and time. Mental status is at baseline.  Psychiatric:        Mood and Affect: Mood normal.        Behavior: Behavior normal.     Lab Results  Component Value Date   WBC 7.9 07/10/2020   HGB 18.2 (H) 07/10/2020   HCT 52.5 (H) 07/10/2020   PLT 181 07/10/2020   GLUCOSE 138 (H) 07/10/2020   CHOL 177 07/10/2020   TRIG 150 (H) 07/10/2020   HDL 43 07/10/2020   LDLCALC 108 (H) 07/10/2020   ALT 31 08/07/2020   AST 25 08/07/2020   NA 143 07/10/2020   K 3.9 07/10/2020   CL 100 07/10/2020   CREATININE 1.04 07/10/2020   BUN 18 07/10/2020   CO2 35 (H) 07/10/2020   TSH 0.61 07/10/2020   PSA 1.7 07/10/2020   INR 1.0 04/15/2017   HGBA1C 6.0 (H) 07/10/2020    CT Abdomen Pelvis W Contrast  Result Date: 09/12/2020 CLINICAL DATA:  Abdominal distension, constipation on off. Weight up and down. EXAM: CT ABDOMEN AND PELVIS WITH CONTRAST TECHNIQUE: Multidetector CT imaging of the abdomen and pelvis was performed using the standard protocol following bolus administration of intravenous contrast. CONTRAST:  166mL ISOVUE-300 IOPAMIDOL (ISOVUE-300) INJECTION 61% COMPARISON:  X-ray abdomen 08/08/2020 FINDINGS: Lower chest: Bibasilar linear atelectasis versus scarring. Hepatobiliary: Diffusely hypodense hepatic parenchyma compared to the spleen consistent with hepatic steatosis. No focal liver abnormality is seen. No gallstones,  gallbladder wall thickening, or biliary dilatation. Pancreas: Unremarkable. No pancreatic ductal dilatation or surrounding inflammatory changes. Spleen: Slightly heterogeneous attenuation of the splenic parenchyma likely due to timing of contrast and motion artifact. Otherwise normal in size with no focal abnormality. Adrenals/Urinary Tract: No adrenal nodule bilaterally. Nonspecific bilateral perinephric stranding. Bilateral kidneys enhance symmetrically. No hydronephrosis. No hydroureter. The urinary bladder is unremarkable. Stomach/Bowel: PO contrast reaches the terminal ileum stomach is within normal limits. Appendix appears normal. No evidence of bowel wall thickening, distention, or inflammatory changes. Vascular/Lymphatic: No abdominal aorta or iliac aneurysm. At least moderate atherosclerotic plaque of the aorta and its branches. Several prominent retroperitoneal and a couple of minimally enlarged retroperitoneal lymph nodes with as an example a 1.3 cm right common iliac node (2:56), a 1 cm left periaortic node at the level of the kidneys (2:42). No inguinal lymphadenopathy. Reproductive: The prostate is enlarged measuring up to 5.5 cm. Other: No intraperitoneal free gas. No organized fluid collection. Musculoskeletal: No abdominal wall hernia or abnormality. Mild anterior abdominal subcutaneus soft tissue edema surrounding the umbilicus. No suspicious lytic or blastic osseous lesions. No acute displaced fracture. Multilevel degenerative changes of the spine. IMPRESSION: 1. Prostatomegaly.  Recommend correlation with PSA levels. 2. Indeterminate prominent and borderline enlarged retroperitoneal lymph nodes. Electronically Signed   By: Iven Finn M.D.   On: 09/12/2020 20:58   Narrative & Impression  CLINICAL DATA:  Fall 5 days ago with knee and foot swelling.  EXAM: RIGHT FOOT COMPLETE - 3+ VIEW  COMPARISON:  Foot radiographs dated 08/07/2020.  FINDINGS: There is no evidence of fracture or  dislocation. There is moderate to severe joint space narrowing with intra-articular calcification of the first metatarsophalangeal joint, unchanged. Degenerative changes of the first tarsal metatarsal joint are redemonstrated. A plantar calcaneal enthesophyte is noted. There is soft tissue swelling surrounding the foot, similar to prior exam.  IMPRESSION: 1. Negative for fracture or dislocation. 2. Moderate to severe first metatarsophalangeal joint osteoarthritis with intra-articular calcification, which may represent calcium pyrophosphate deposition.   Electronically Signed  By: Zerita Boers M.D.   On: 09/24/2020 10:03   Narrative & Impression  CLINICAL DATA:  Fall 5 days ago with knee and foot swelling.  EXAM: RIGHT KNEE - COMPLETE 4+ VIEW  COMPARISON:  None.  FINDINGS: No evidence of fracture, dislocation, or joint effusion. There is mild-to-moderate tricompartmental knee osteoarthritis. There is soft tissue swelling anterior to the knee.  IMPRESSION: 1. Soft tissue swelling anterior to the knee without acute fracture or dislocation. 2. Mild to moderate tricompartmental osteoarthritis.   Electronically Signed   By: Zerita Boers M.D.   On: 09/24/2020 09:59     Assessment & Plan:   Robert Church was seen today for osteoarthritis.  Diagnoses and all orders for this visit:  Right knee injury, initial encounter- Based on his symptoms, exam, and x-ray he has a flareup of osteoarthritis with a contusion.  He will rest, ice, elevate the right lower extremity and take tramadol as needed for pain. -     DG Knee Complete 4 Views Right; Future  Right foot injury, initial encounter- Based on his symptoms, exam, and x-ray he has a foot contusion.  He will rest, ice, elevate and take tramadol as needed. -     DG Foot Complete Right; Future  Primary osteoarthritis of both knees- I have asked him to see orthopedics to see if he needs to undergo an MRI or to consider knee  replacement therapy. -     traMADol (ULTRAM) 50 MG tablet; Take 1 tablet (50 mg total) by mouth every 6 (six) hours as needed. -     Ambulatory referral to Orthopedic Surgery  Facial abrasion, initial encounter- His symptoms and exam are consistent with a facial abrasion.  I do not see any signs of a head injury.  He will let me know if he develops any new or worsening symptoms.   I am having [name] start on traMADol. I am also having him maintain his albuterol, Azelastine-Fluticasone, cholecalciferol, torsemide, roflumilast, albuterol, predniSONE, ezetimibe, ALPRAZolam, omeprazole, NovoFine, Colchicine, allopurinol, EPINEPHrine, Ozempic (0.25 or 0.5 MG/DOSE), Dulera, Spiriva Respimat, montelukast, losartan, and hydrALAZINE.  Meds ordered this encounter  Medications  . traMADol (ULTRAM) 50 MG tablet    Sig: Take 1 tablet (50 mg total) by mouth every 6 (six) hours as needed.    Dispense:  90 tablet    Refill:  2   I spent 50 minutes in preparing to see the patient by review of recent labs, imaging and procedures, obtaining and reviewing separately obtained history, communicating with the patient and family or caregiver, ordering medications, tests or procedures, and documenting clinical information in the EHR including the differential Dx, treatment, and any further evaluation and other management of 1. Right knee injury, initial encounter 2. Right foot injury, initial encounter 3. Primary osteoarthritis of both knees 4. Facial abrasion, initial encounter     Follow-up: Return in about 3 months (around 12/25/2020).  Scarlette Calico, MD

## 2020-10-01 ENCOUNTER — Telehealth: Payer: Self-pay | Admitting: Emergency Medicine

## 2020-10-01 MED ORDER — ALBUTEROL SULFATE HFA 108 (90 BASE) MCG/ACT IN AERS: 2.0000 | INHALATION_SPRAY | Freq: Four times a day (QID) | RESPIRATORY_TRACT | 5 refills | Status: DC | PRN

## 2020-10-01 MED ORDER — ALBUTEROL SULFATE (2.5 MG/3ML) 0.083% IN NEBU: 2.5000 mg | INHALATION_SOLUTION | Freq: Four times a day (QID) | RESPIRATORY_TRACT | 12 refills | Status: DC | PRN

## 2020-10-01 MED ORDER — SPIRIVA RESPIMAT 2.5 MCG/ACT IN AERS: 2.5000 ug | INHALATION_SPRAY | Freq: Two times a day (BID) | RESPIRATORY_TRACT | 11 refills | Status: DC

## 2020-10-01 NOTE — Telephone Encounter (Signed)
Refill of pt's albuterol inhaler and neb sol as well as pt's spiriva inhaler have been sent to preferred pharmacy for pt. Called and spoke with pt letting him know this had been done and he verbalized understanding. Nothing further needed.

## 2020-10-02 ENCOUNTER — Ambulatory Visit: Payer: 59 | Admitting: Vascular Surgery

## 2020-10-02 ENCOUNTER — Encounter (HOSPITAL_COMMUNITY): Payer: 59

## 2020-10-02 NOTE — Telephone Encounter (Signed)
Called and left detailed msg on mom's vm that no referral is needed and pt can call [phone] to schedule.

## 2020-10-03 ENCOUNTER — Other Ambulatory Visit: Payer: Self-pay

## 2020-10-03 ENCOUNTER — Ambulatory Visit (INDEPENDENT_AMBULATORY_CARE_PROVIDER_SITE_OTHER): Payer: 59 | Admitting: Orthopaedic Surgery

## 2020-10-03 ENCOUNTER — Encounter: Payer: Self-pay | Admitting: Orthopaedic Surgery

## 2020-10-03 DIAGNOSIS — M25561 Pain in right knee: Secondary | ICD-10-CM | POA: Diagnosis not present

## 2020-10-03 MED ORDER — CEPHALEXIN 500 MG PO CAPS: ORAL_CAPSULE | ORAL | 0 refills | Status: DC

## 2020-10-03 NOTE — Progress Notes (Signed)
Office Visit Note   Patient: Robert Church           Date of Birth: 06/12/1956           MRN: [id number] Visit Date: 10/03/2020              Requested by: Janith Lima, MD [address] [place],  [place] [address] PCP: Janith Lima, MD   Assessment & Plan: Visit Diagnoses:  1. Acute pain of right knee     Plan: Robert Church relates that 2 weeks ago he walked into his house after feeding some feral cats and fell landing directly on the anterior aspect of his right knee.  He was seen in the local emergency facility with x-rays of the knee.  There was no evidence of a fracture.  He does have some degenerative changes.  He developed a "fair amount" of swelling around his knee with areas of redness along the inner thigh extending into the groin.  He is not had any fever or chills.  He notes that he is a lot better now than he was even a week ago with less swelling.  He is able to bend his knee with greater ease.  He does have history of congestive heart failure and is taking an 81 mg aspirin on a daily basis.  I am not sure if he has developed an early cellulitis. he does have significant induration of both lower extremities from the mid tibia distally on a chronic basis and is being evaluated with vascular studies.  I think he is fine to proceed with those.  I am going to place him on Keflex 500 mg 3 times a day for a week.  I am not sure that he has cellulitis but want to be sure that he is at least treated for that.  I also suspect that he is had a hemarthrosis of the right knee from his aspirin but he has developed greater range of motion and not using any ambulatory aid.  He does not appear to have any significant internal derangement as he can fully extend his knee and does not have any evidence of instability or any localized areas of tenderness other than along the popliteal area and IT band.  His leg is not warm.  I like to check him back in a week or sooner if he has any further  problems.  Some point we may want to consider an MRI scan but I did think this is going to resolve on its own  Follow-Up Instructions: Return in about 1 week (around 10/10/2020).   Orders:  No orders of the defined types were placed in this encounter.  Meds ordered this encounter  Medications  . cephALEXin (KEFLEX) 500 MG capsule    Sig: Take 1 tablet three times daily.    Dispense:  21 capsule    Refill:  0      Procedures: No procedures performed   Clinical Data: No additional findings.   Subjective: Chief Complaint  Patient presents with  . Right Knee - Pain  Patient presents today for right knee pain. He states that about two weeks ago he was walking in the back door and fell, landing directly on his right knee. He has had pain and swelling since. His pain is located posteriorly. He has a very difficult time bending his knee to get in the car. No grinding, giving way, or popping. He is taking BC powder for pain.  He states that his knee feels fine to walk, just painful to bend.  Robert Church has a history of congestive heart failure and is taking aspirin.  He also has a number of allergies to antibiotics but notes he has been able to take Keflex in the past without a problem.  He is not diabetic  HPI  Review of Systems   Objective: Vital Signs: Ht 5\' 10"  (1.778 m)   Wt 237 lb (107.5 kg)   BMI 34.01 kg/m   Physical Exam Constitutional:      Appearance: He is well-developed.  Eyes:     Pupils: Pupils are equal, round, and reactive to light.  Pulmonary:     Effort: Pulmonary effort is normal.  Skin:    General: Skin is warm and dry.  Neurological:     Mental Status: He is alert and oriented to person, place, and time.  Psychiatric:        Behavior: Behavior normal.     Ortho Exam right knee is generally swollen but he notes it is less than it was even a week ago.  He might have an effusion.  There is an area of induration along the inner thigh extending from his  knee to the groin.  It was not hot but certainly was pinkish-red.  He has significant induration of both of his lower extremities from about the mid tibia distally with firmness of the skin yet.  He has good capillary refill.  No skin lesions.  No evidence of any instability about the right knee.  He able is able to fully extend his knee without any tenderness over the patella tendon of the quadriceps.  No pain over the patella.  There is no opening with varus valgus stress and a negative anterior drawer sign.  No thigh pain except very minimally along the distal IT band  Specialty Comments:  No specialty comments available.  Imaging: No results found.   PMFS History: Patient Active Problem List   Diagnosis Date Noted  . Pain in right knee 10/03/2020  . Right knee injury, initial encounter 09/24/2020  . Right foot injury, initial encounter 09/24/2020  . Primary osteoarthritis of both knees 09/24/2020  . Gynecomastia, male 08/08/2020  . Abdominal distension 08/08/2020  . Idiopathic chronic gout of foot without tophus 08/08/2020  . Yellow jacket sting allergy 08/08/2020  . Prediabetes 08/08/2020  . Abdominal distension (gaseous) 08/08/2020  . Screening for HIV (human immunodeficiency virus) 08/05/2020  . Need for hepatitis C screening test 08/05/2020  . Foot pain, bilateral 08/05/2020  . Class 2 severe obesity due to excess calories with serious comorbidity and body mass index (BMI) of 38.0 to 38.9 in adult ([place]) 08/05/2020  . Hyperlipidemia LDL goal <70 07/11/2020  . Hypogonadism male 07/08/2020  . Hypertriglyceridemia 07/08/2020  . Hyperglycemia 07/08/2020  . Routine general medical examination at a health care facility 07/08/2020  . Umbilical hernia without obstruction and without gangrene 07/08/2020  . Pain in left knee 04/12/2020  . Chronic combined systolic and diastolic CHF (congestive heart failure) ([place]) 01/09/2020  . Solitary pulmonary nodule 08/15/2019  . Chronic anal  fissure 06/07/2019  . NICM (nonischemic cardiomyopathy) ([place]) 12/16/2018  . History of adenomatous polyp of colon 11/25/2018  . Dyslipidemia 12/15/2017  . Bronchitis 02/01/2017  . Morbid obesity ([place]) 11/24/2015  . Cigarette smoker 01/16/2015  . COPD, group D, by GOLD 2017 classification ([place]) 03/03/2013  . Obstructive sleep apnea syndrome 01/05/2013  . Asbestos exposure 01/05/2013  . Seasonal  allergic rhinitis 10/06/2012  . GERD (gastroesophageal reflux disease) 06/05/2012  . Insomnia 06/05/2012  . 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 HTN (hypertension) 07/23/2011   Past Medical History:  Diagnosis Date  . Allergy   . Asthma   . Bronchitis   . COPD (chronic obstructive pulmonary disease) ([place])   . GERD (gastroesophageal reflux disease)   . Hypertension   . Pneumonia   . Sinusitis   . SOB (shortness of breath)     Family History  Problem Relation Age of Onset  . Lung cancer Father   . Brain cancer Father     Past Surgical History:  Procedure Laterality Date  . LEFT HEART CATH AND CORONARY ANGIOGRAPHY N/A 04/19/2017   Procedure: Left Heart Cath and Coronary Angiography;  Surgeon: Belva Crome, MD;  Location: [facility];  Service: Cardiovascular;  Laterality: N/A;  . TONSILLECTOMY     Social History   Occupational History  . Occupation: Dealer  Tobacco Use  . Smoking status: Current Every Day Smoker    Packs/day: 0.25    Years: 46.00    Pack years: 11.50    Types: Cigarettes  . Smokeless tobacco: Never Used  . Tobacco comment: 3 cigarettes smoked daily 09/13/20 [facility]   Vaping Use  . Vaping Use: Never used  Substance and Sexual Activity  . Alcohol use: Yes    Alcohol/week: 0.0 standard drinks  . Drug use: No  . Sexual activity: Yes    Birth control/protection: None

## 2020-10-04 ENCOUNTER — Telehealth: Payer: Self-pay | Admitting: Emergency Medicine

## 2020-10-04 MED ORDER — LEVOFLOXACIN 500 MG PO TABS: 500.0000 mg | ORAL_TABLET | Freq: Every day | ORAL | 0 refills | Status: DC

## 2020-10-04 NOTE — Telephone Encounter (Signed)
Rx for Levaquin 500mg  has been sent to preferred pharmacy.  Patient is aware and voiced his understanding.  Nothing further needed.

## 2020-10-04 NOTE — Telephone Encounter (Signed)
Primary Pulmonologist: Byrum Last office visit and with whom: 09/13/2020  Byrum What do we see them for (pulmonary problems): COPD Last OV assessment/plan: Assessment & Plan:  COPD, group D, by GOLD 2017 classification ([place]) Labile symptoms.  More mucus, more dyspnea although the mucus is clear.  He may have benefited from [place] in August at least transiently.  Considered initiating cycled antibiotics but he has allergies to several.  Discussed possibly going up on his maintenance prednisone dose.  For now he wants to stay on 5 mg daily.  We will continue this, continue his bronchodilator regimen, his allergy regimen, Daliresp.  Suspect that ultimately we will have to go up on the daily prednisone.  Talked him about smoking cessation today  Please continue Spiriva and Dulera as you have been taking them.  Rinse and gargle after the [facility] Keep albuterol available to use up to every 4 hours if needed for shortness of breath, chest tightness, wheezing. Continue prednisone 5 mg daily Continue Daliresp Continue Singulair, Astelin/fluticasone nasal spray, Mucinex as you have been taking them Need to stop smoking altogether You benefit from the flu shot or the COVID-19 vaccines.  Let us know if you change your mind about getting these. Follow with Dr Lamonte Sakai in 3 months or sooner if you have any problems.    Baltazar Apo, MD, PhD 09/13/2020, 2:29 PM [facility] [phone] or if no answer [phone]     Assessment & Plan Note by Collene Gobble, MD at 09/13/2020 2:27 PM Author: Collene Gobble, MD Author Type: Physician Filed: 09/13/2020 2:29 PM  Note Status: Written Cosign: Cosign Not Required Encounter Date: 09/13/2020  Problem: COPD, group D, by GOLD 2017 classification [facility])  Editor: Collene Gobble, MD (Physician)                 Labile symptoms.  More mucus, more dyspnea although the mucus is clear.  He may have benefited from [place] in August at least  transiently.  Considered initiating cycled antibiotics but he has allergies to several.  Discussed possibly going up on his maintenance prednisone dose.  For now he wants to stay on 5 mg daily.  We will continue this, continue his bronchodilator regimen, his allergy regimen, Daliresp.  Suspect that ultimately we will have to go up on the daily prednisone.  Talked him about smoking cessation today  Please continue Spiriva and Dulera as you have been taking them.  Rinse and gargle after the [facility] Keep albuterol available to use up to every 4 hours if needed for shortness of breath, chest tightness, wheezing. Continue prednisone 5 mg daily Continue Daliresp Continue Singulair, Astelin/fluticasone nasal spray, Mucinex as you have been taking them Need to stop smoking altogether You benefit from the flu shot or the COVID-19 vaccines.  Let us know if you change your mind about getting these. Follow with Dr Lamonte Sakai in 3 months or sooner if you have any problems.    Patient Instructions by Collene Gobble, MD at 09/13/2020 2:00 PM Author: Collene Gobble, MD Author Type: Physician Filed: 09/13/2020 2:26 PM  Note Status: Signed Cosign: Cosign Not Required Encounter Date: 09/13/2020  Editor: Collene Gobble, MD (Physician)                 Please continue Spiriva and [name] as you have been taking them.  Rinse and gargle after the [facility] Keep albuterol available to use up to every 4 hours if needed for shortness of  breath, chest tightness, wheezing. Continue prednisone 5 mg daily Continue Daliresp Continue Singulair, Astelin/fluticasone nasal spray, Mucinex as you have been taking them Need to stop smoking altogether You benefit from the flu shot or the COVID-19 vaccines.  Let us know if you change your mind about getting these. Follow with Dr Lamonte Sakai in 3 months or sooner if you have any problems.     Instructions  Please continue Spiriva and Dulera as you have been taking them.  Rinse and gargle  after the [facility] Keep albuterol available to use up to every 4 hours if needed for shortness of breath, chest tightness, wheezing. Continue prednisone 5 mg daily Continue Daliresp Continue Singulair, Astelin/fluticasone nasal spray, Mucinex as you have been taking them Need to stop smoking altogether You benefit from the flu shot or the COVID-19 vaccines.  Let us know if you change your mind about getting these. Follow with Dr Lamonte Sakai in 3 months or sooner if you have any problems.          Was appointment offered to patient (explain)? No, routing to [name].   Reason for call: Patient states he is coughing up light yellow mucus for the past couple of days.  States the mucus is coming from his head and chest.  Coughing a lot at night.  He is using his nebulizer 4 x per day.  He is using his respiratory medications as prescribed.  He has not had his covid vaccines.  He denies and fever, headache, chills, body aches, sick contacts.  States he has used some musinex otc.  He is requesting an antibiotic.  Please advise.  Thank you.  (examples of things to ask: : When did symptoms start? Fever? Cough? Productive? Color to sputum? More sputum than usual? Wheezing? Have you needed increased oxygen? Are you taking your respiratory medications? What over the counter measures have you tried?)  Allergies  Allergen Reactions  . Clindamycin Other (See Comments)    Tongue swelling  . Doxycycline     Severe stomach upset per patient  . E-Mycin [Erythromycin Base] Swelling  . Penicillins Swelling and Other (See Comments)    Childhood rxn--MD stated he "almost died" Has patient had a PCN reaction causing immediate rash, facial/tongue/throat swelling, SOB or lightheadedness with hypotension:Yes Has patient had a PCN reaction causing severe rash involving mucus membranes or skin necrosis:unsure Has patient had a PCN reaction that required hospitalization:unsure Has patient had a PCN reaction occurring  within the last 10 years:No If all of the above answers are "NO", then may proceed with Cephalosporin use.    . Rosuvastatin     Other reaction(s): Myalgias (intolerance)    Immunization History  Administered Date(s) Administered  . Tdap 12/03/2011

## 2020-10-04 NOTE — Telephone Encounter (Signed)
Patient is calling about RX for antiobotic. Pharmacy is [facility]. Patient phone number is [phone].

## 2020-10-04 NOTE — Telephone Encounter (Signed)
Please ask him to take levaquin 500mg  qd x 7 days,  Needs to let us know how he progresses

## 2020-10-08 ENCOUNTER — Other Ambulatory Visit: Payer: Self-pay

## 2020-10-08 ENCOUNTER — Telehealth: Payer: Self-pay

## 2020-10-08 MED ORDER — SULFAMETHOXAZOLE-TRIMETHOPRIM 800-160 MG PO TABS: 1.0000 | ORAL_TABLET | Freq: Two times a day (BID) | ORAL | 0 refills | Status: DC

## 2020-10-08 NOTE — Telephone Encounter (Signed)
Septra DS 1 tab po bid x7days

## 2020-10-08 NOTE — Telephone Encounter (Signed)
Patient called he stated he was prescribed cephalexin he stated he cant take that because he's allergic to penicillin he is requesting new medication ASAP .call [phone]

## 2020-10-08 NOTE — Progress Notes (Signed)
septra 

## 2020-10-08 NOTE — Telephone Encounter (Signed)
Spoke with patient and sent in Septra. Patient aware.

## 2020-10-08 NOTE — Telephone Encounter (Signed)
Please advise 

## 2020-10-16 ENCOUNTER — Ambulatory Visit: Payer: 59 | Admitting: Orthopaedic Surgery

## 2020-10-17 ENCOUNTER — Telehealth: Payer: Self-pay | Admitting: Internal Medicine

## 2020-10-17 ENCOUNTER — Ambulatory Visit: Payer: 59 | Admitting: Orthopaedic Surgery

## 2020-10-17 NOTE — Telephone Encounter (Signed)
Patient leaves to go out of town for two weeks tomorrow and would like a refill on his medication.  ALPRAZolam Duanne Moron) 1 MG tablet [id number] [address], [place] AT [facility] [address] [place] Phone:  [phone]  Fax:  [phone]

## 2020-10-18 ENCOUNTER — Other Ambulatory Visit: Payer: Self-pay | Admitting: Internal Medicine

## 2020-10-18 DIAGNOSIS — F411 Generalized anxiety disorder: Secondary | ICD-10-CM

## 2020-10-22 ENCOUNTER — Telehealth: Payer: Self-pay | Admitting: Emergency Medicine

## 2020-10-22 NOTE — Telephone Encounter (Signed)
Spoke with pt. He is having increased shortness of breath, decreased oxygen levels in the morning and chest congestion. While speaking to him it seemed like he needed to be seen in office. Pt can't come in for an appointment, he recently hurt his knee and can't walk good. Pt has been scheduled for a televisit with RB on 10/23/20 at 1330. Advised pt to call us back if his breathing gets worse or seek emergency help. Nothing further was needed.

## 2020-10-23 ENCOUNTER — Encounter: Payer: Self-pay | Admitting: Emergency Medicine

## 2020-10-23 ENCOUNTER — Ambulatory Visit (INDEPENDENT_AMBULATORY_CARE_PROVIDER_SITE_OTHER): Payer: 59 | Admitting: Emergency Medicine

## 2020-10-23 ENCOUNTER — Other Ambulatory Visit: Payer: Self-pay

## 2020-10-23 DIAGNOSIS — J449 Chronic obstructive pulmonary disease, unspecified: Secondary | ICD-10-CM | POA: Diagnosis not present

## 2020-10-23 MED ORDER — PREDNISONE 10 MG PO TABS: 10.0000 mg | ORAL_TABLET | Freq: Every day | ORAL | 0 refills | Status: DC

## 2020-10-23 NOTE — Progress Notes (Signed)
Virtual Visit via Video Note  I connected with Robert Church on 10/23/20 at  1:30 PM EDT by a video enabled telemedicine application and verified that I am speaking with the correct person using two identifiers.  Location: Patient: Home Provider: Office   I discussed the limitations of evaluation and management by telemedicine and the availability of in person appointments. The patient expressed understanding and agreed to proceed.  History of Present Illness: 64 year old active smoker with severe COPD and a chronic bronchitic phenotype, purulent sputum production. He has had labile symptoms, cough, mucus production. Currently managed on Daliresp, prednisone 5 mg, Spiriva, Dulera, Singulair. He uses Astelin/fluticasone nasal spray, Mucinex. We have considered cycled antibiotics but he is allergic to many. He was treated with Levaquin in August and then again in mid October.   Observations/Objective: States that he is doing "fair", especially difficult in the am. He is waked up at night, has to get up to cough up mucous, use albuterol. He is using albuterol about 2-3x a day. Lots of fatigue with any exertion. Intermittent wheeze. He had a recent fall, injured his knee and back. Smoking less - 2-3 cig a day.   Assessment and Plan: COPD, group D, by GOLD 2017 classification ([place]) Severe COPD, frequent symptoms, frequent albuterol use.  Thick clear secretions, improved purulence since recent treatment with levofloxacin.  We have considered cycled antibiotics but he does not tolerate these well.  We could possibly consider alternating Levaquin and a cephalosporin.  I think for now the best strategy is to increase his prednisone to 10 mg daily, continue his current bronchodilator regimen.  We talked about stopping smoking-he has decreased.  Question whether he has exertional desaturations.  He needs a repeat walking oximetry.  I want to get this set up in the next month.  He injured his knee and  does not feel like he can do a walk at this time.  I will make an OV for him to see me and walk on the same day.  If his knee improves and he feels that he can come in and do a walking oximetry sooner than he will call to let us know.    Follow Up Instructions: 1 month with walking oximetry same day   I discussed the assessment and treatment plan with the patient. The patient was provided an opportunity to ask questions and all were answered. The patient agreed with the plan and demonstrated an understanding of the instructions.   The patient was advised to call back or seek an in-person evaluation if the symptoms worsen or if the condition fails to improve as anticipated.  I provided 16 minutes of non-face-to-face time during this encounter.   Collene Gobble, MD

## 2020-10-23 NOTE — Assessment & Plan Note (Signed)
Severe COPD, frequent symptoms, frequent albuterol use.  Thick clear secretions, improved purulence since recent treatment with levofloxacin.  We have considered cycled antibiotics but he does not tolerate these well.  We could possibly consider alternating Levaquin and a cephalosporin.  I think for now the best strategy is to increase his prednisone to 10 mg daily, continue his current bronchodilator regimen.  We talked about stopping smoking-he has decreased.  Question whether he has exertional desaturations.  He needs a repeat walking oximetry.  I want to get this set up in the next month.  He injured his knee and does not feel like he can do a walk at this time.  I will make an OV for him to see me and walk on the same day.  If his knee improves and he feels that he can come in and do a walking oximetry sooner than he will call to let us know.

## 2020-10-23 NOTE — Addendum Note (Signed)
Addended by: Gavin Potters R on: 10/23/2020 02:07 PM   Modules accepted: Orders

## 2020-10-29 ENCOUNTER — Other Ambulatory Visit: Payer: Self-pay

## 2020-10-29 ENCOUNTER — Encounter (HOSPITAL_COMMUNITY): Payer: Self-pay | Admitting: Emergency Medicine

## 2020-10-29 ENCOUNTER — Ambulatory Visit (INDEPENDENT_AMBULATORY_CARE_PROVIDER_SITE_OTHER): Payer: 59 | Admitting: Orthopedic Surgery

## 2020-10-29 ENCOUNTER — Emergency Department (HOSPITAL_COMMUNITY)
Admission: EM | Admit: 2020-10-29 | Discharge: 2020-10-29 | Disposition: A | Discharge status: Home / Self Care | Payer: 59 | Attending: Emergency Medicine | Admitting: Emergency Medicine

## 2020-10-29 ENCOUNTER — Encounter: Payer: Self-pay | Admitting: Orthopedic Surgery

## 2020-10-29 ENCOUNTER — Ambulatory Visit (INDEPENDENT_AMBULATORY_CARE_PROVIDER_SITE_OTHER): Payer: 59

## 2020-10-29 VITALS — Temp 97.7°F | Ht 70.0 in | Wt 237.0 lb

## 2020-10-29 DIAGNOSIS — M25551 Pain in right hip: Secondary | ICD-10-CM

## 2020-10-29 DIAGNOSIS — F17210 Nicotine dependence, cigarettes, uncomplicated: Secondary | ICD-10-CM | POA: Insufficient documentation

## 2020-10-29 DIAGNOSIS — J45909 Unspecified asthma, uncomplicated: Secondary | ICD-10-CM | POA: Diagnosis not present

## 2020-10-29 DIAGNOSIS — Z794 Long term (current) use of insulin: Secondary | ICD-10-CM | POA: Diagnosis not present

## 2020-10-29 DIAGNOSIS — R7303 Prediabetes: Secondary | ICD-10-CM | POA: Insufficient documentation

## 2020-10-29 DIAGNOSIS — I5042 Chronic combined systolic (congestive) and diastolic (congestive) heart failure: Secondary | ICD-10-CM | POA: Insufficient documentation

## 2020-10-29 DIAGNOSIS — I872 Venous insufficiency (chronic) (peripheral): Secondary | ICD-10-CM | POA: Diagnosis not present

## 2020-10-29 DIAGNOSIS — I110 Hypertensive heart disease with heart failure: Secondary | ICD-10-CM | POA: Insufficient documentation

## 2020-10-29 DIAGNOSIS — Z79899 Other long term (current) drug therapy: Secondary | ICD-10-CM | POA: Insufficient documentation

## 2020-10-29 DIAGNOSIS — M7989 Other specified soft tissue disorders: Secondary | ICD-10-CM

## 2020-10-29 DIAGNOSIS — R2241 Localized swelling, mass and lump, right lower limb: Secondary | ICD-10-CM | POA: Insufficient documentation

## 2020-10-29 DIAGNOSIS — Z7952 Long term (current) use of systemic steroids: Secondary | ICD-10-CM | POA: Diagnosis not present

## 2020-10-29 NOTE — Progress Notes (Signed)
Office Visit Note   Patient: Robert Church           Date of Birth: Mar 02, 1956           MRN: [id number] Visit Date: 10/29/2020              Requested by: Janith Lima, MD [address] [place],  [place] [address] PCP: Janith Lima, MD   Assessment & Plan: Visit Diagnoses:  1. Pain in right hip   2. Edema of lower extremity due to peripheral venous insufficiency     Plan:  #1: At this time I talked to Dr. Ronnald Ramp as well as the vascular surgeon and were going to send him over to the emergency room for an urgent Doppler. #2: Follow back up as needed  Follow-Up Instructions: No follow-ups on file.   Face-to-face time spent with patient was greater than 60 minutes.  Greater than 50% of the time was spent in counseling and coordination of care.  Orders:  Orders Placed This Encounter  Procedures  . XR HIP UNILAT W OR W/O PELVIS 2-3 VIEWS RIGHT   No orders of the defined types were placed in this encounter.     Procedures: No procedures performed   Clinical Data: No additional findings.   Subjective: Chief Complaint  Patient presents with  . Right Knee - Follow-up  . Right Hip - Pain   HPI Patient presents today for a follow up on his right knee. He said that his right knee continues to hurt and swollen. He has been having minimal pain since his injury over a month ago. In the last two weeks he has been having more pain in his right hip. His pain is located in his groin. No numbness or tingling. No previous hip surgery. His hip only hurts with certain movements. He is taking BC powders for pain relief.     Review of Systems  Constitutional: Negative for fatigue.  HENT: Negative for ear pain.   Eyes: Negative for pain.  Respiratory: Positive for shortness of breath.   Cardiovascular: Negative for leg swelling.  Gastrointestinal: Positive for constipation and diarrhea.  Endocrine: Negative for cold intolerance and heat intolerance.  Genitourinary:  Negative for difficulty urinating.  Musculoskeletal: Positive for joint swelling.  Skin: Negative for rash.  Allergic/Immunologic: Negative for food allergies.  Neurological: Positive for weakness.  Hematological: Does not bruise/bleed easily.  Psychiatric/Behavioral: Negative for sleep disturbance.     Objective: Vital Signs: Temp 97.7 F (36.5 C) (Oral)   Ht 5\' 10"  (1.778 m)   Wt 237 lb (107.5 kg)   BMI 34.01 kg/m   Physical Exam Constitutional:      Appearance: Normal appearance. He is obese.  HENT:     Head: Normocephalic.  Pulmonary:     Effort: Pulmonary effort is normal.  Musculoskeletal:     Right lower leg: Edema present.  Skin:    General: Skin is warm.     Findings: Erythema present.  Neurological:     Mental Status: He is alert and oriented to person, place, and time.  Psychiatric:        Mood and Affect: Mood normal.        Behavior: Behavior normal.        Thought Content: Thought content normal.        Judgment: Judgment normal.     Ortho Exam  Exam today reveals marked edema in the right lower extremity.  At 1 point he  has had 3 inches above the patella on the right leg is 21-1/2 inches of the girth and on the left 18-1/2 inches a girth.  Cannot really feel pulses because of the edema.  I moved his hip but he states there is no pain in his groin.  However he does have pain that refers from his groin into his testicles he states.  I do not feel any masses in his groin at this time.  Specialty Comments:  No specialty comments available.  Imaging: No results found.   PMFS History: Patient Active Problem List   Diagnosis Date Noted  . Edema of lower extremity due to peripheral venous insufficiency 10/29/2020  . Pain in right knee 10/03/2020  . Right knee injury, initial encounter 09/24/2020  . Right foot injury, initial encounter 09/24/2020  . Primary osteoarthritis of both knees 09/24/2020  . Gynecomastia, male 08/08/2020  . Abdominal distension  08/08/2020  . Idiopathic chronic gout of foot without tophus 08/08/2020  . Yellow jacket sting allergy 08/08/2020  . Prediabetes 08/08/2020  . Abdominal distension (gaseous) 08/08/2020  . Screening for HIV (human immunodeficiency virus) 08/05/2020  . Need for hepatitis C screening test 08/05/2020  . Foot pain, bilateral 08/05/2020  . Class 2 severe obesity due to excess calories with serious comorbidity and body mass index (BMI) of 38.0 to 38.9 in adult ([place]) 08/05/2020  . Hyperlipidemia LDL goal <70 07/11/2020  . Hypogonadism male 07/08/2020  . Hypertriglyceridemia 07/08/2020  . Hyperglycemia 07/08/2020  . Routine general medical examination at a health care facility 07/08/2020  . Umbilical hernia without obstruction and without gangrene 07/08/2020  . Pain in left knee 04/12/2020  . Chronic combined systolic and diastolic CHF (congestive heart failure) ([place]) 01/09/2020  . Solitary pulmonary nodule 08/15/2019  . 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 NICM (nonischemic cardiomyopathy) ([place]) 12/16/2018  . History of adenomatous polyp of colon 11/25/2018  . Dyslipidemia 12/15/2017  . Bronchitis 02/01/2017  . Morbid obesity ([place]) 11/24/2015  . Cigarette smoker 01/16/2015  . COPD, group D, by GOLD 2017 classification ([place]) 03/03/2013  . Obstructive sleep apnea syndrome 01/05/2013  . Asbestos exposure 01/05/2013  . Seasonal allergic rhinitis 10/06/2012  . GERD (gastroesophageal reflux disease) 06/05/2012  . Insomnia 06/05/2012  . 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 HTN (hypertension) 07/23/2011   Past Medical History:  Diagnosis Date  . Allergy   . Asthma   . Bronchitis   . COPD (chronic obstructive pulmonary disease) ([place])   . GERD (gastroesophageal reflux disease)   . Hypertension   . Pneumonia   . Sinusitis   . SOB (shortness of breath)     Family History  Problem Relation Age of Onset  . Lung cancer Father   . Brain cancer Father     Past Surgical History:  Procedure  Laterality Date  . LEFT HEART CATH AND CORONARY ANGIOGRAPHY N/A 04/19/2017   Procedure: Left Heart Cath and Coronary Angiography;  Surgeon: Belva Crome, MD;  Location: [facility];  Service: Cardiovascular;  Laterality: N/A;  . TONSILLECTOMY     Social History   Occupational History  . Occupation: Dealer  Tobacco Use  . Smoking status: Current Every Day Smoker    Packs/day: 0.25    Years: 46.00    Pack years: 11.50    Types: Cigarettes  . Smokeless tobacco: Never Used  . Tobacco comment: 3 cigarettes smoked daily  [facility] 10/23/20  Vaping Use  . Vaping Use: Never used  Substance and Sexual Activity  .  Alcohol use: Yes    Alcohol/week: 0.0 standard drinks  . Drug use: No  . Sexual activity: Yes    Birth control/protection: None

## 2020-10-29 NOTE — Discharge Instructions (Addendum)
IMPORTANT PATIENT INSTRUCTIONS:  You have been scheduled for an Outpatient Vascular Study at [facility].    If tomorrow is a Saturday, Sunday or holiday, please go to the [facility] Emergency Department Registration Desk at 11 am tomorrow morning and tell them you are there for a vascular study.  If tomorrow is a weekday (Monday-Friday), please go to [facility] [place] at 11 am and tell them you are there for a vascular study ("A DVT ultrasound").

## 2020-10-29 NOTE — ED Provider Notes (Signed)
[facility] EMERGENCY DEPARTMENT Provider Note   CSN: [id number] Arrival date & time: 10/29/20  1646     History Chief Complaint  Patient presents with  . Leg Swelling    Robert Church is a 64 y.o. male presenting to ED with right leg swelling.  He has had a gradual onset 3-4 weeks ago, with tenderness in his posterior upper leg.  Never had this before.  No hx of DVT or PE.  He's had xray images performed as an outpatient and was referred to the ED for a DVT ultrasound today.    HPI     Past Medical History:  Diagnosis Date  . Allergy   . Asthma   . Bronchitis   . COPD (chronic obstructive pulmonary disease) ([place])   . GERD (gastroesophageal reflux disease)   . Hypertension   . Pneumonia   . Sinusitis   . SOB (shortness of breath)     Patient Active Problem List   Diagnosis Date Noted  . Edema of lower extremity due to peripheral venous insufficiency 10/29/2020  . Pain in right knee 10/03/2020  . Right knee injury, initial encounter 09/24/2020  . Right foot injury, initial encounter 09/24/2020  . Primary osteoarthritis of both knees 09/24/2020  . Gynecomastia, male 08/08/2020  . Abdominal distension 08/08/2020  . Idiopathic chronic gout of foot without tophus 08/08/2020  . Yellow jacket sting allergy 08/08/2020  . Prediabetes 08/08/2020  . Abdominal distension (gaseous) 08/08/2020  . Screening for HIV (human immunodeficiency virus) 08/05/2020  . Need for hepatitis C screening test 08/05/2020  . Foot pain, bilateral 08/05/2020  . Class 2 severe obesity due to excess calories with serious comorbidity and body mass index (BMI) of 38.0 to 38.9 in adult ([place]) 08/05/2020  . Hyperlipidemia LDL goal <70 07/11/2020  . Hypogonadism male 07/08/2020  . Hypertriglyceridemia 07/08/2020  . Hyperglycemia 07/08/2020  . Routine general medical examination at a health care facility 07/08/2020  . Umbilical hernia without obstruction and without gangrene  07/08/2020  . Pain in left knee 04/12/2020  . Chronic combined systolic and diastolic CHF (congestive heart failure) ([place]) 01/09/2020  . Solitary pulmonary nodule 08/15/2019  . 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 NICM (nonischemic cardiomyopathy) ([place]) 12/16/2018  . History of adenomatous polyp of colon 11/25/2018  . Dyslipidemia 12/15/2017  . Bronchitis 02/01/2017  . Morbid obesity ([place]) 11/24/2015  . Cigarette smoker 01/16/2015  . COPD, group D, by GOLD 2017 classification ([place]) 03/03/2013  . Obstructive sleep apnea syndrome 01/05/2013  . Asbestos exposure 01/05/2013  . Seasonal allergic rhinitis 10/06/2012  . GERD (gastroesophageal reflux disease) 06/05/2012  . Insomnia 06/05/2012  . 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 HTN (hypertension) 07/23/2011    Past Surgical History:  Procedure Laterality Date  . LEFT HEART CATH AND CORONARY ANGIOGRAPHY N/A 04/19/2017   Procedure: Left Heart Cath and Coronary Angiography;  Surgeon: Belva Crome, MD;  Location: [facility];  Service: Cardiovascular;  Laterality: N/A;  . TONSILLECTOMY         Family History  Problem Relation Age of Onset  . Lung cancer Father   . Brain cancer Father     Social History   Tobacco Use  . Smoking status: Current Every Day Smoker    Packs/day: 0.25    Years: 46.00    Pack years: 11.50    Types: Cigarettes  . Smokeless tobacco: Never Used  . Tobacco comment: 3 cigarettes smoked daily  ARJ 10/23/20  Vaping Use  .  Vaping Use: Never used  Substance Use Topics  . Alcohol use: Yes    Alcohol/week: 0.0 standard drinks  . Drug use: No    Home Medications Prior to Admission medications   Medication Sig Start Date End Date Taking? Authorizing Provider  albuterol (PROVENTIL) (2.5 MG/3ML) 0.083% nebulizer solution Take 3 mLs (2.5 mg total) by nebulization every 6 (six) hours as needed for wheezing or shortness of breath. 10/01/20   Collene Gobble, MD  albuterol (VENTOLIN HFA) 108 (90 Base) MCG/ACT  inhaler Inhale 2 puffs into the lungs every 6 (six) hours as needed for wheezing. 10/01/20   Collene Gobble, MD  allopurinol (ZYLOPRIM) 300 MG tablet Take 1 tablet (300 mg total) by mouth daily. 08/08/20   Janith Lima, MD  ALPRAZolam Duanne Moron) 1 MG tablet TAKE 1 TABLET(1 MG) BY MOUTH TWICE DAILY AS NEEDED FOR ANXIETY 10/18/20   Janith Lima, MD  Azelastine-Fluticasone [phone] MCG/ACT SUSP 1 spray each nostril twice daily 08/31/14   Collene Gobble, MD  cholecalciferol (VITAMIN D) 1000 units tablet Take 1,000 Units by mouth daily.    [provider]  Colchicine (MITIGARE) 0.6 MG CAPS Take 1 tablet by mouth 2 (two) times daily. 08/08/20   Janith Lima, MD  EPINEPHrine 0.3 mg/0.3 mL IJ SOAJ injection Inject 0.3 mLs (0.3 mg total) into the muscle as needed for anaphylaxis. 08/08/20   Janith Lima, MD  ezetimibe (ZETIA) 10 MG tablet Take 1 tablet (10 mg total) by mouth daily. 07/23/20   Janith Lima, MD  hydrALAZINE (APRESOLINE) 10 MG tablet Take 1 tablet (10 mg total) by mouth in the morning and at bedtime. 09/19/20   Almyra Deforest, PA  Insulin Pen Needle (NOVOFINE) 32G X 6 MM MISC 1 Act by Does not apply route once a week. 08/05/20   Janith Lima, MD  losartan (COZAAR) 100 MG tablet Take 1 tablet (100 mg total) by mouth daily. 09/18/20 03/17/21  [name], MD  mometasone-formoterol [facility]) 200-5 MCG/ACT AERO Take 2 puffs first thing in am and then another 2 puffs about 12 hours later. 08/19/20   Collene Gobble, MD  montelukast (SINGULAIR) 10 MG tablet Take 1 tablet (10 mg total) by mouth at bedtime. 09/12/20   Janith Lima, MD  omeprazole (PRILOSEC) 20 MG capsule Take 1 capsule (20 mg total) by mouth 2 (two) times daily before a meal. 07/28/20   Janith Lima, MD  predniSONE (DELTASONE) 10 MG tablet Take 0.5 tablets (5 mg total) by mouth daily with breakfast. Pt taking 1/2 tablet 07/18/20   Collene Gobble, MD  predniSONE (DELTASONE) 10 MG tablet Take 1 tablet (10 mg total) by mouth  daily with breakfast. 10/23/20   Collene Gobble, MD  roflumilast (DALIRESP) 500 MCG TABS tablet Take 1 tablet (500 mcg total) by mouth daily. 06/12/20   Collene Gobble, MD  Semaglutide,0.25 or 0.5MG /DOS, (OZEMPIC, 0.25 OR 0.5 MG/DOSE,) 2 MG/1.5ML SOPN Inject 0.1875 mLs (0.25 mg total) into the skin once a week. 08/08/20   Janith Lima, MD  Tiotropium Bromide Monohydrate (SPIRIVA RESPIMAT) 2.5 MCG/ACT AERS Inhale 2.5 mcg into the lungs 2 (two) times daily. 10/01/20   Collene Gobble, MD  torsemide (DEMADEX) 20 MG tablet Take 2 tablets (40 mg total) by mouth daily. 01/11/20   Bensimhon, Shaune Pascal, MD  traMADol (ULTRAM) 50 MG tablet Take 1 tablet (50 mg total) by mouth every 6 (six) hours as needed. 09/24/20 12/16/20  Janith Lima, MD    Allergies    Clindamycin, Doxycycline, E-mycin [erythromycin base], Penicillins, and Rosuvastatin  Review of Systems   Review of Systems  Constitutional: Negative for chills and fever.  Musculoskeletal: Positive for arthralgias and myalgias.  Neurological: Negative for weakness and numbness.    Physical Exam Updated Vital Signs BP (!) 152/101   Pulse 88   Temp 97.8 F (36.6 C) (Oral)   Resp 16   Ht 5\' 10"  (1.778 m)   Wt 107.5 kg   SpO2 93%   BMI 34.01 kg/m   Physical Exam Vitals and nursing note reviewed.  Constitutional:      Appearance: He is well-developed.  HENT:     Head: Normocephalic and atraumatic.  Eyes:     Conjunctiva/sclera: Conjunctivae normal.  Cardiovascular:     Rate and Rhythm: Normal rate and regular rhythm.  Pulmonary:     Effort: Pulmonary effort is normal. No respiratory distress.  Musculoskeletal:     Comments: Unilateral mild swelling of right upper extremity Symmetrical pitting edema of lower extremities  Skin:    General: Skin is warm and dry.  Neurological:     General: No focal deficit present.     Mental Status: He is alert and oriented to person, place, and time.     ED Results / Procedures /  Treatments   Labs (all labs ordered are listed, but only abnormal results are displayed) Labs Reviewed - No data to display  EKG None  Radiology XR HIP UNILAT W OR W/O PELVIS 2-3 VIEWS RIGHT  Result Date: 10/29/2020 2 view x-ray AP pelvis and right hip reveals maybe some sclerotic areas over the acetabulum laterally.  But otherwise maintaining a joint space.  Is a difficult to really interpret this because of the amount of exogenous obesity covering the area.  He does have some cystic changes in the greater trochanteric area.   Procedures Procedures (including critical care time)  Medications Ordered in ED Medications - No data to display  ED Course  I have reviewed the triage vital signs and the nursing notes.  Pertinent labs & imaging results that were available during my care of the patient were reviewed by me and considered in my medical decision making (see chart for details).  64 year old male here with right leg swelling for 3-4 weeks. Advised to come to ED for DVT study. Unfortunately we do not have this vascular study available at this hour.  I advised return for DVT ultrasound tomorrow morning - prescription ordered per discharge.    I advised a lovenox dose, but the patient declined.  He is concerned about bleeding risks, and says he bleeds and bruises easily.  As it's been 4 weeks already of symptoms, I think it's reasonable to forego this dose tonight if he can get his scan tomorrow.  Okay for discharge.      Final Clinical Impression(s) / ED Diagnoses Final diagnoses:  Leg swelling    Rx / DC Orders ED Discharge Orders         Ordered    LE VENOUS        10/29/20 2041           Wyvonnia Dusky, MD 10/30/20 [phone]

## 2020-10-29 NOTE — ED Triage Notes (Signed)
Patient arrives to ED with complaints of right thigh swelling, soreness, and redness x1 month. Pt states that he fell on the thigh a month earlier which caused pain, brushing, and swelling and has not healed since. Pt with negative hip and knee x-rays. Sent here for possible DVT.

## 2020-10-30 ENCOUNTER — Telehealth: Payer: Self-pay | Admitting: Orthopaedic Surgery

## 2020-10-30 ENCOUNTER — Ambulatory Visit (HOSPITAL_COMMUNITY)
Admission: RE | Admit: 2020-10-30 | Discharge: 2020-10-30 | Disposition: A | Discharge status: Home / Self Care | Payer: 59 | Source: Ambulatory Visit | Attending: Emergency Medicine | Admitting: Emergency Medicine

## 2020-10-30 DIAGNOSIS — M79651 Pain in right thigh: Secondary | ICD-10-CM | POA: Insufficient documentation

## 2020-10-30 DIAGNOSIS — R609 Edema, unspecified: Secondary | ICD-10-CM | POA: Diagnosis present

## 2020-10-30 DIAGNOSIS — M7989 Other specified soft tissue disorders: Secondary | ICD-10-CM

## 2020-10-30 DIAGNOSIS — M79609 Pain in unspecified limb: Secondary | ICD-10-CM

## 2020-10-30 NOTE — Telephone Encounter (Signed)
Please call and advise patient on next steps.

## 2020-10-30 NOTE — Progress Notes (Signed)
Lower extremity venous has been completed.   Preliminary results in CV Proc.   Robert Church 10/30/2020 11:07 AM

## 2020-10-30 NOTE — Telephone Encounter (Signed)
Patient called requesting a call back. Patient went for doppler test for blood clots and unfound and patient would like to know next step. Patient legs are still swollen. Please call patient at [phone].

## 2020-10-31 ENCOUNTER — Ambulatory Visit (INDEPENDENT_AMBULATORY_CARE_PROVIDER_SITE_OTHER): Payer: 59 | Admitting: Orthopaedic Surgery

## 2020-10-31 ENCOUNTER — Other Ambulatory Visit: Payer: Self-pay

## 2020-10-31 ENCOUNTER — Encounter: Payer: Self-pay | Admitting: Orthopaedic Surgery

## 2020-10-31 DIAGNOSIS — I872 Venous insufficiency (chronic) (peripheral): Secondary | ICD-10-CM | POA: Diagnosis not present

## 2020-10-31 DIAGNOSIS — R599 Enlarged lymph nodes, unspecified: Secondary | ICD-10-CM

## 2020-10-31 DIAGNOSIS — N5082 Scrotal pain: Secondary | ICD-10-CM | POA: Diagnosis not present

## 2020-10-31 NOTE — Progress Notes (Signed)
Office Visit Note   Patient: Robert Church           Date of Birth: Jul 11, 1956           MRN: [id number] Visit Date: 10/31/2020              Requested by: Janith Lima, MD [address] [place],  [place] [id number] PCP: Janith Lima, MD   Assessment & Plan: Visit Diagnoses:  1. Peripheral Edema both lower extremities 2. Swelling both legs right greater than left    Plan: #1:  MRI of pelvis without contrast.  Concern about contrast with his myriad of problems #2:  Follow up after MRI #3:  Needs to see his Vascular doctor for vascular evaluation that he was to have had previously.  Follow-Up Instructions: After   MRI  Face-to-face time spent with patient was greater than 30 minutes.  Greater than 50% of the time was spent in counseling and coordination of care.  Orders:  Orders Placed This Encounter  Procedures  . MR Pelvis w/o contrast  . Ambulatory referral to Vascular Surgery   No orders of the defined types were placed in this encounter.     Procedures: No procedures performed   Clinical Data: No additional findings.   Subjective: Chief Complaint  Patient presents with  . Right Leg - Leg Swelling    HPI  Robert Church is seen today for follow-up of his edema.  He did have a Doppler that was performed that did not reveal any deep vein thrombosis.  This was was performed 10/30/2020.  No clots were noted.  His history though is that he was seen by vascular previously and was supposed to have some type of blood flow study on the right lower extremity but this was never completed.   Review of Systems   Objective: Vital Signs: There were no vitals taken for this visit.  Physical Exam Constitutional:      Appearance: He is well-developed. He is obese.  HENT:     Head: Normocephalic.  Eyes:     Pupils: Pupils are equal, round, and reactive to light.  Pulmonary:     Effort: Pulmonary effort is normal.  Skin:    General: Skin is warm and dry.    Neurological:     Mental Status: He is alert and oriented to person, place, and time.  Psychiatric:        Mood and Affect: Mood normal.        Behavior: Behavior normal.     Ortho Exam  Exam swelling of both legs but appears that the right is larger than the left.  He has what appears to be cyanotic lower extremities which are cool to touch.  Marked pitting edema more right than left.    Measurements:     RIGHT  LEFT   15 cm above patella  59 cm  55 cm  15 cm below patella  44.cm  44.75 cm  25 cm below patella  42 cm  42 cm  35 cm below patella  38 cm  26 cm   Slow refill right foot great toe 4 seconds    Left foot 2 seconds  [place]  Specialty Comments:  No specialty comments available.  Imaging: CLINICAL DATA:  64 year old male with a history of right leg swelling  EXAM: RIGHT LOWER EXTREMITY VENOUS DOPPLER ULTRASOUND  TECHNIQUE: Gray-scale sonography with graded compression, as well as color Doppler and duplex ultrasound were performed  to evaluate the lower extremity deep venous systems from the level of the common femoral vein and including the common femoral, femoral, profunda femoral, popliteal and calf veins including the posterior tibial, peroneal and gastrocnemius veins when visible. The superficial great saphenous vein was also interrogated. Spectral Doppler was utilized to evaluate flow at rest and with distal augmentation maneuvers in the common femoral, femoral and popliteal veins.  COMPARISON:  None.  FINDINGS: Contralateral Common Femoral Vein: Respiratory phasicity is normal and symmetric with the symptomatic side. No evidence of thrombus. Normal compressibility.  Common Femoral Vein: No evidence of thrombus. Normal compressibility, respiratory phasicity and response to augmentation.  Saphenofemoral Junction: No evidence of thrombus. Normal compressibility and flow on color Doppler imaging.  Profunda Femoral Vein: No evidence of  thrombus. Normal compressibility and flow on color Doppler imaging.  Femoral Vein: No evidence of thrombus. Normal compressibility, respiratory phasicity and response to augmentation.  Popliteal Vein: No evidence of thrombus. Normal compressibility, respiratory phasicity and response to augmentation.  Calf Veins: No evidence of thrombus. Normal compressibility and flow on color Doppler imaging.  Superficial Great Saphenous Vein: No evidence of thrombus. Normal compressibility and flow on color Doppler imaging.  Other Findings: Heterogeneously hypoechoic fluid collection within the lower medial calf within the superficial soft tissues measuring 1.9 cm x 1.0 cm x 3.8 cm. No internal flow with some mild internal septations.  IMPRESSION: Sonographic survey of the right lower extremity negative for DVT.  Complex fluid collection in the medial right calf, potentially seroma or evolving hematoma.   Electronically Signed   By: Corrie Mckusick D.O.   On: 05/04/2019 12:29   PMFS History: Current Outpatient Medications  Medication Sig Dispense Refill  . albuterol (PROVENTIL) (2.5 MG/3ML) 0.083% nebulizer solution Take 3 mLs (2.5 mg total) by nebulization every 6 (six) hours as needed for wheezing or shortness of breath. 75 mL 12  . albuterol (VENTOLIN HFA) 108 (90 Base) MCG/ACT inhaler Inhale 2 puffs into the lungs every 6 (six) hours as needed for wheezing. 18 g 5  . allopurinol (ZYLOPRIM) 300 MG tablet Take 1 tablet (300 mg total) by mouth daily. 30 tablet 5  . ALPRAZolam (XANAX) 1 MG tablet TAKE 1 TABLET(1 MG) BY MOUTH TWICE DAILY AS NEEDED FOR ANXIETY 180 tablet 0  . Azelastine-Fluticasone 137-50 MCG/ACT SUSP 1 spray each nostril twice daily 23 g 6  . cholecalciferol (VITAMIN D) 1000 units tablet Take 1,000 Units by mouth daily.    . Colchicine (MITIGARE) 0.6 MG CAPS Take 1 tablet by mouth 2 (two) times daily. 60 capsule 5  . EPINEPHrine 0.3 mg/0.3 mL IJ SOAJ injection  Inject 0.3 mLs (0.3 mg total) into the muscle as needed for anaphylaxis. 2 each 2  . ezetimibe (ZETIA) 10 MG tablet Take 1 tablet (10 mg total) by mouth daily. 90 tablet 1  . hydrALAZINE (APRESOLINE) 10 MG tablet Take 1 tablet (10 mg total) by mouth in the morning and at bedtime. 180 tablet 3  . Insulin Pen Needle (NOVOFINE) 32G X 6 MM MISC 1 Act by Does not apply route once a week. 50 each 0  . losartan (COZAAR) 100 MG tablet Take 1 tablet (100 mg total) by mouth daily. 90 tablet 1  . mometasone-formoterol (DULERA) 200-5 MCG/ACT AERO Take 2 puffs first thing in am and then another 2 puffs about 12 hours later. 13 g 11  . montelukast (SINGULAIR) 10 MG tablet Take 1 tablet (10 mg total) by mouth at bedtime. 30 tablet 2  .  omeprazole (PRILOSEC) 20 MG capsule Take 1 capsule (20 mg total) by mouth 2 (two) times daily before a meal. 180 capsule 1  . predniSONE (DELTASONE) 10 MG tablet Take 0.5 tablets (5 mg total) by mouth daily with breakfast. Pt taking 1/2 tablet 30 tablet 2  . predniSONE (DELTASONE) 10 MG tablet Take 1 tablet (10 mg total) by mouth daily with breakfast. 30 tablet 0  . roflumilast (DALIRESP) 500 MCG TABS tablet Take 1 tablet (500 mcg total) by mouth daily. 30 tablet 3  . Semaglutide,0.25 or 0.5MG/DOS, (OZEMPIC, 0.25 OR 0.5 MG/DOSE,) 2 MG/1.5ML SOPN Inject 0.1875 mLs (0.25 mg total) into the skin once a week. 2 mL 0  . Tiotropium Bromide Monohydrate (SPIRIVA RESPIMAT) 2.5 MCG/ACT AERS Inhale 2.5 mcg into the lungs 2 (two) times daily. 4 g 11  . torsemide (DEMADEX) 20 MG tablet Take 2 tablets (40 mg total) by mouth daily. 180 tablet 3  . traMADol (ULTRAM) 50 MG tablet Take 1 tablet (50 mg total) by mouth every 6 (six) hours as needed. 90 tablet 2   No current facility-administered medications for this visit.    Patient Active Problem List   Diagnosis Date Noted  . Edema of lower extremity due to peripheral venous insufficiency 10/29/2020  . Pain in right knee 10/03/2020  . Right  knee injury, initial encounter 09/24/2020  . Right foot injury, initial encounter 09/24/2020  . Primary osteoarthritis of both knees 09/24/2020  . Gynecomastia, male 08/08/2020  . Abdominal distension 08/08/2020  . Idiopathic chronic gout of foot without tophus 08/08/2020  . Yellow jacket sting allergy 08/08/2020  . Prediabetes 08/08/2020  . Abdominal distension (gaseous) 08/08/2020  . Screening for HIV (human immunodeficiency virus) 08/05/2020  . Need for hepatitis C screening test 08/05/2020  . Foot pain, bilateral 08/05/2020  . Class 2 severe obesity due to excess calories with serious comorbidity and body mass index (BMI) of 38.0 to 38.9 in adult ([place]) 08/05/2020  . Hyperlipidemia LDL goal <70 07/11/2020  . Hypogonadism male 07/08/2020  . Hypertriglyceridemia 07/08/2020  . Hyperglycemia 07/08/2020  . Routine general medical examination at a health care facility 07/08/2020  . Umbilical hernia without obstruction and without gangrene 07/08/2020  . Pain in left knee 04/12/2020  . Chronic combined systolic and diastolic CHF (congestive heart failure) ([place]) 01/09/2020  . Solitary pulmonary nodule 08/15/2019  . 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 NICM (nonischemic cardiomyopathy) ([place]) 12/16/2018  . History of adenomatous polyp of colon 11/25/2018  . Dyslipidemia 12/15/2017  . Bronchitis 02/01/2017  . Morbid obesity ([place]) 11/24/2015  . Cigarette smoker 01/16/2015  . COPD, group D, by GOLD 2017 classification ([place]) 03/03/2013  . Obstructive sleep apnea syndrome 01/05/2013  . Asbestos exposure 01/05/2013  . Seasonal allergic rhinitis 10/06/2012  . GERD (gastroesophageal reflux disease) 06/05/2012  . Insomnia 06/05/2012  . 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 HTN (hypertension) 07/23/2011   Past Medical History:  Diagnosis Date  . Allergy   . Asthma   . Bronchitis   . COPD (chronic obstructive pulmonary disease) ([place])   . GERD (gastroesophageal reflux disease)   . Hypertension   .  Pneumonia   . Sinusitis   . SOB (shortness of breath)     Family History  Problem Relation Age of Onset  . Lung cancer Father   . Brain cancer Father     Past Surgical History:  Procedure Laterality Date  . LEFT HEART CATH AND CORONARY ANGIOGRAPHY N/A 04/19/2017   Procedure: Left Heart Cath and  Coronary Angiography;  Surgeon: Belva Crome, MD;  Location: [facility];  Service: Cardiovascular;  Laterality: N/A;  . TONSILLECTOMY     Social History   Occupational History  . Occupation: Dealer  Tobacco Use  . Smoking status: Current Every Day Smoker    Packs/day: 0.25    Years: 46.00    Pack years: 11.50    Types: Cigarettes  . Smokeless tobacco: Never Used  . Tobacco comment: 3 cigarettes smoked daily  ARJ 10/23/20  Vaping Use  . Vaping Use: Never used  Substance and Sexual Activity  . Alcohol use: Yes    Alcohol/week: 0.0 standard drinks  . Drug use: No  . Sexual activity: Yes    Birth control/protection: None

## 2020-11-04 ENCOUNTER — Telehealth: Payer: Self-pay | Admitting: Internal Medicine

## 2020-11-04 NOTE — Telephone Encounter (Signed)
Patient was seen 10.05.21 for a fall and Dr. Ronnald Ramp mentioned getting a CT for his head to make sure everything was okay and patient states he would now like that because he is still having a ringing in his ear since then.  Patient # [phone]

## 2020-11-05 ENCOUNTER — Other Ambulatory Visit: Payer: Self-pay | Admitting: Internal Medicine

## 2020-11-05 DIAGNOSIS — S0990XA Unspecified injury of head, initial encounter: Secondary | ICD-10-CM | POA: Insufficient documentation

## 2020-11-05 DIAGNOSIS — S0990XD Unspecified injury of head, subsequent encounter: Secondary | ICD-10-CM

## 2020-11-05 NOTE — Telephone Encounter (Signed)
Spoke to him.  He will call medical doctor (Dr Ronnald Ramp) to be seen and evaluated for the swelling

## 2020-11-05 NOTE — Telephone Encounter (Signed)
LVM informing pt that MRI has been ordered.

## 2020-11-06 ENCOUNTER — Ambulatory Visit: Payer: 59 | Admitting: Internal Medicine

## 2020-11-06 DIAGNOSIS — Z0289 Encounter for other administrative examinations: Secondary | ICD-10-CM

## 2020-11-08 ENCOUNTER — Other Ambulatory Visit: Payer: Self-pay

## 2020-11-08 ENCOUNTER — Ambulatory Visit
Admission: RE | Admit: 2020-11-08 | Discharge: 2020-11-08 | Disposition: A | Discharge status: Home / Self Care | Payer: 59 | Source: Ambulatory Visit | Attending: Orthopedic Surgery | Admitting: Orthopedic Surgery

## 2020-11-24 ENCOUNTER — Other Ambulatory Visit: Payer: 59

## 2020-11-26 ENCOUNTER — Encounter: Payer: Self-pay | Admitting: Orthopaedic Surgery

## 2020-11-26 ENCOUNTER — Ambulatory Visit (INDEPENDENT_AMBULATORY_CARE_PROVIDER_SITE_OTHER): Payer: 59 | Admitting: Orthopaedic Surgery

## 2020-11-26 ENCOUNTER — Other Ambulatory Visit: Payer: Self-pay

## 2020-11-26 VITALS — Ht 70.0 in | Wt 237.0 lb

## 2020-11-26 DIAGNOSIS — R2689 Other abnormalities of gait and mobility: Secondary | ICD-10-CM | POA: Diagnosis not present

## 2020-11-26 DIAGNOSIS — I872 Venous insufficiency (chronic) (peripheral): Secondary | ICD-10-CM

## 2020-11-26 NOTE — Progress Notes (Signed)
Office Visit Note   Patient: Robert Church           Date of Birth: 1956-08-22           MRN: [id number] Visit Date: 11/26/2020              Requested by: Janith Lima, MD [address] [place],  [place] [id number] PCP: Janith Lima, MD   Assessment & Plan: Visit Diagnoses:  1. Edema of lower extremity due to peripheral venous insufficiency   2. Balance problem     Plan: Robert Church is accompanied by his mother and here for follow-up evaluation of the acute on chronic swelling of his right lower extremity.  Apparently has had some problems with swelling in his legs for "some time" and has been on Lasix.  He fell within the last several month and has had more swelling on the right side.  His leg has been indurated in both thigh and calf.  He has not lost any sensation.  He has had Doppler study that was negative for any venous obstruction.  I performed a CT scan of his pelvis as he is terribly claustrophobic and could not do the MRI scan that showed a few reactive lymph nodes in the inguinal region but otherwise no other pathology in the pelvis.  At this point I think we need to address several issues.  I would like him to see the vascular surgeons to evaluate the looks lower extremity swelling and induration.  We will make the appointment.  We thought that have already been made but apparently he is not had a call back from them.  He also is falling frequently and I think he needs to see the neurologist.  We will make that referral as well.  He has a history of sleep apnea which may or may not be playing some part in all of this.  He does have a primary care physician.  His mother also has contacted Dr. Nelva Bush at [facility] for issues with his neck and his back.  She has seen him in the past and would like his opinion.  We have not seen him for either of those issues but will provide a copy of the CT scan report of his pelvis and CT the of his films performed here in the  office.  Follow-Up Instructions: Return if symptoms worsen or fail to improve.   Orders:  Orders Placed This Encounter  Procedures  . Ambulatory referral to Neurology  . Ambulatory referral to Vascular Surgery   No orders of the defined types were placed in this encounter.     Procedures: No procedures performed   Clinical Data: No additional findings.   Subjective: Chief Complaint  Patient presents with  . Right Leg - Follow-up    MRI review  Patient presents today for follow up on his right hip. He had an MRI of his pelvis, and is here today for those results.  Has had chronic issues with swelling of both lower extremities.  His cardiologist has him on Lasix.  He relates having an exacerbation of swelling and induration of his right lower extremity from a fall within the last 1-1/2 to 2 months.  He has a history of sleep apnea and frequently falls asleep during the day.  He also feels like he has an issue with his balance.  He is never seen a neurologist.  He did have a Doppler study of his right lower  extremity without evidence of venous occlusion but has not been seen by the vascular surgeons as yet  HPI  Review of Systems   Objective: Vital Signs: Ht 5\' 10"  (1.778 m)   Wt 237 lb (107.5 kg)   BMI 34.01 kg/m   Physical Exam Constitutional:      Appearance: He is well-developed.  Eyes:     Pupils: Pupils are equal, round, and reactive to light.  Pulmonary:     Effort: Pulmonary effort is normal.  Skin:    General: Skin is warm and dry.  Neurological:     Mental Status: He is alert and oriented to person, place, and time.  Psychiatric:        Behavior: Behavior normal.     Ortho Exam awake alert and oriented x3.  Comfortable sitting.  He does have chronic induration of his right thigh and both of his calves.  The skin was not warm.  There were no blisters or oozing.  Painless range of motion of both hips and straight leg raise is negative he relates having  normal sensation.  Good capillary refill to his toes.  He does have nonpitting edema both lower extremities but not much pain  Specialty Comments:  No specialty comments available.  Imaging: No results found.   PMFS History: Patient Active Problem List   Diagnosis Date Noted  . Head injury due to trauma 11/05/2020  . Edema of lower extremity due to peripheral venous insufficiency 10/29/2020  . Pain in right knee 10/03/2020  . Right knee injury, initial encounter 09/24/2020  . Right foot injury, initial encounter 09/24/2020  . Primary osteoarthritis of both knees 09/24/2020  . Gynecomastia, male 08/08/2020  . Abdominal distension 08/08/2020  . Idiopathic chronic gout of foot without tophus 08/08/2020  . Yellow jacket sting allergy 08/08/2020  . Prediabetes 08/08/2020  . Abdominal distension (gaseous) 08/08/2020  . Screening for HIV (human immunodeficiency virus) 08/05/2020  . Need for hepatitis C screening test 08/05/2020  . Foot pain, bilateral 08/05/2020  . Class 2 severe obesity due to excess calories with serious comorbidity and body mass index (BMI) of 38.0 to 38.9 in adult ([facility]) 08/05/2020  . Hyperlipidemia LDL goal <70 07/11/2020  . Hypogonadism male 07/08/2020  . Hypertriglyceridemia 07/08/2020  . Hyperglycemia 07/08/2020  . Routine general medical examination at a health care facility 07/08/2020  . Umbilical hernia without obstruction and without gangrene 07/08/2020  . Pain in left knee 04/12/2020  . Chronic combined systolic and diastolic CHF (congestive heart failure) ([place]) 01/09/2020  . Solitary pulmonary nodule 08/15/2019  . 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 NICM (nonischemic cardiomyopathy) ([facility]) 12/16/2018  . History of adenomatous polyp of colon 11/25/2018  . Dyslipidemia 12/15/2017  . Bronchitis 02/01/2017  . Morbid obesity ([facility]) 11/24/2015  . Cigarette smoker 01/16/2015  . COPD, group D, by GOLD 2017 classification ([facility]) 03/03/2013  . Obstructive sleep  apnea syndrome 01/05/2013  . Asbestos exposure 01/05/2013  . Seasonal allergic rhinitis 10/06/2012  . GERD (gastroesophageal reflux disease) 06/05/2012  . Insomnia 06/05/2012  . 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 HTN (hypertension) 07/23/2011   Past Medical History:  Diagnosis Date  . Allergy   . Asthma   . Bronchitis   . COPD (chronic obstructive pulmonary disease) ([facility])   . GERD (gastroesophageal reflux disease)   . Hypertension   . Pneumonia   . Sinusitis   . SOB (shortness of breath)     Family History  Problem Relation Age of Onset  .  Lung cancer Father   . Brain cancer Father     Past Surgical History:  Procedure Laterality Date  . LEFT HEART CATH AND CORONARY ANGIOGRAPHY N/A 04/19/2017   Procedure: Left Heart Cath and Coronary Angiography;  Surgeon: Belva Crome, MD;  Location: [facility];  Service: Cardiovascular;  Laterality: N/A;  . TONSILLECTOMY     Social History   Occupational History  . Occupation: Dealer  Tobacco Use  . Smoking status: Current Every Day Smoker    Packs/day: 0.25    Years: 46.00    Pack years: 11.50    Types: Cigarettes  . Smokeless tobacco: Never Used  . Tobacco comment: 3 cigarettes smoked daily  ARJ 10/23/20  Vaping Use  . Vaping Use: Never used  Substance and Sexual Activity  . Alcohol use: Yes    Alcohol/week: 0.0 standard drinks  . Drug use: No  . Sexual activity: Yes    Birth control/protection: None

## 2020-11-27 ENCOUNTER — Ambulatory Visit: Payer: 59 | Admitting: Cardiovascular Disease

## 2020-11-28 ENCOUNTER — Encounter: Payer: Self-pay | Admitting: Internal Medicine

## 2020-11-28 ENCOUNTER — Ambulatory Visit (INDEPENDENT_AMBULATORY_CARE_PROVIDER_SITE_OTHER): Payer: 59 | Admitting: Internal Medicine

## 2020-11-28 ENCOUNTER — Other Ambulatory Visit: Payer: Self-pay

## 2020-11-28 VITALS — BP 122/84 | HR 81 | Temp 98.1°F | Resp 16 | Ht 70.0 in | Wt 247.0 lb

## 2020-11-28 DIAGNOSIS — I5042 Chronic combined systolic (congestive) and diastolic (congestive) heart failure: Secondary | ICD-10-CM

## 2020-11-28 DIAGNOSIS — R590 Localized enlarged lymph nodes: Secondary | ICD-10-CM | POA: Diagnosis not present

## 2020-11-28 DIAGNOSIS — M1A0790 Idiopathic chronic gout, unspecified ankle and foot, without tophus (tophi): Secondary | ICD-10-CM | POA: Diagnosis not present

## 2020-11-28 DIAGNOSIS — E118 Type 2 diabetes mellitus with unspecified complications: Secondary | ICD-10-CM

## 2020-11-28 DIAGNOSIS — R270 Ataxia, unspecified: Secondary | ICD-10-CM

## 2020-11-28 DIAGNOSIS — I10 Essential (primary) hypertension: Secondary | ICD-10-CM

## 2020-11-28 DIAGNOSIS — R7303 Prediabetes: Secondary | ICD-10-CM

## 2020-11-28 DIAGNOSIS — D696 Thrombocytopenia, unspecified: Secondary | ICD-10-CM

## 2020-11-28 LAB — BASIC METABOLIC PANEL
BUN: 23 mg/dL (ref 6–23)
CO2: 36 mEq/L — ABNORMAL HIGH (ref 19–32)
Calcium: 8.6 mg/dL (ref 8.4–10.5)
Chloride: 100 mEq/L (ref 96–112)
Creatinine, Ser: 1.13 mg/dL (ref 0.40–1.50)
GFR: 69.78 mL/min (ref >60.00)
Glucose, Bld: 110 mg/dL — ABNORMAL HIGH (ref 70–99)
Potassium: 3.8 mEq/L (ref 3.5–5.1)
Sodium: 141 mEq/L (ref 135–145)

## 2020-11-28 LAB — CBC WITH DIFFERENTIAL/PLATELET
Basophils Absolute: 0 10*3/uL (ref 0.0–0.1)
Basophils Relative: 0.4 % (ref 0.0–3.0)
Eosinophils Absolute: 0 10*3/uL (ref 0.0–0.7)
Eosinophils Relative: 0.6 % (ref 0.0–5.0)
HCT: 51.5 % (ref 39.0–52.0)
Hemoglobin: 17 g/dL (ref 13.0–17.0)
Lymphocytes Relative: 8.9 % — ABNORMAL LOW (ref 12.0–46.0)
Lymphs Abs: 0.7 10*3/uL (ref 0.7–4.0)
MCHC: 33 g/dL (ref 30.0–36.0)
MCV: 99.2 fl (ref 78.0–100.0)
Monocytes Absolute: 0.6 10*3/uL (ref 0.1–1.0)
Monocytes Relative: 8.1 % (ref 3.0–12.0)
Neutro Abs: 6.5 10*3/uL (ref 1.4–7.7)
Neutrophils Relative %: 82 % — ABNORMAL HIGH (ref 43.0–77.0)
Platelets: 144 10*3/uL — ABNORMAL LOW (ref 150.0–400.0)
RBC: 5.19 Mil/uL (ref 4.22–5.81)
RDW: 13.9 % (ref 11.5–15.5)
WBC: 7.9 10*3/uL (ref 4.0–10.5)

## 2020-11-28 LAB — HEMOGLOBIN A1C: Hgb A1c MFr Bld: 6.6 % — ABNORMAL HIGH (ref 4.6–6.5)

## 2020-11-28 LAB — URIC ACID: Uric Acid, Serum: 9.8 mg/dL — ABNORMAL HIGH (ref 4.0–7.8)

## 2020-11-28 NOTE — Progress Notes (Signed)
Subjective:  Patient ID: Robert Church, male    DOB: [birth year]  Age: 64 y.o. MRN: [id number]  CC: Diabetes and Congestive Heart Failure  This visit occurred during the SARS-CoV-2 public health emergency.  Safety protocols were in place, including screening questions prior to the visit, additional usage of staff PPE, and extensive cleaning of exam room while observing appropriate contact time as indicated for disinfecting solutions.     HPI Robert Church presents for f/up - He had a fall a few months and continues to complain of ataxia.  I ordered an MRI but he has not done it yet.  He tells me he has been seeing an orthopedist about lower extremity edema and pain.  He has had multiple x-rays done.  A recent MRI indicated mild inguinal lymphadenopathy.  He tells me he is going to see a vascular surgeon about the lower extremity edema.  He is not active and does not monitor his blood sugar.  He denies polys.  He complains of weight gain.  He tells me the orthopedic surgeon told him to see a neurologist for ataxia, neck pain, and numbness and tingling in his right hand.  Outpatient Medications Prior to Visit  Medication Sig Dispense Refill  . albuterol (PROVENTIL) (2.5 MG/3ML) 0.083% nebulizer solution Take 3 mLs (2.5 mg total) by nebulization every 6 (six) hours as needed for wheezing or shortness of breath. 75 mL 12  . albuterol (VENTOLIN HFA) 108 (90 Base) MCG/ACT inhaler Inhale 2 puffs into the lungs every 6 (six) hours as needed for wheezing. 18 g 5  . Azelastine-Fluticasone 137-50 MCG/ACT SUSP 1 spray each nostril twice daily 23 g 6  . cholecalciferol (VITAMIN D) 1000 units tablet Take 1,000 Units by mouth daily.    . Colchicine (MITIGARE) 0.6 MG CAPS Take 1 tablet by mouth 2 (two) times daily. 60 capsule 5  . EPINEPHrine 0.3 mg/0.3 mL IJ SOAJ injection Inject 0.3 mLs (0.3 mg total) into the muscle as needed for anaphylaxis. 2 each 2  . ezetimibe (ZETIA) 10 MG tablet Take 1  tablet (10 mg total) by mouth daily. 90 tablet 1  . hydrALAZINE (APRESOLINE) 10 MG tablet Take 1 tablet (10 mg total) by mouth in the morning and at bedtime. 180 tablet 3  . Insulin Pen Needle (NOVOFINE) 32G X 6 MM MISC 1 Act by Does not apply route once a week. 50 each 0  . losartan (COZAAR) 100 MG tablet Take 1 tablet (100 mg total) by mouth daily. 90 tablet 1  . mometasone-formoterol (DULERA) 200-5 MCG/ACT AERO Take 2 puffs first thing in am and then another 2 puffs about 12 hours later. 13 g 11  . montelukast (SINGULAIR) 10 MG tablet Take 1 tablet (10 mg total) by mouth at bedtime. 30 tablet 2  . omeprazole (PRILOSEC) 20 MG capsule Take 1 capsule (20 mg total) by mouth 2 (two) times daily before a meal. 180 capsule 1  . predniSONE (DELTASONE) 10 MG tablet Take 1 tablet (10 mg total) by mouth daily with breakfast. 30 tablet 0  . roflumilast (DALIRESP) 500 MCG TABS tablet Take 1 tablet (500 mcg total) by mouth daily. 30 tablet 3  . Tiotropium Bromide Monohydrate (SPIRIVA RESPIMAT) 2.5 MCG/ACT AERS Inhale 2.5 mcg into the lungs 2 (two) times daily. 4 g 11  . torsemide (DEMADEX) 20 MG tablet Take 2 tablets (40 mg total) by mouth daily. 180 tablet 3  . traMADol (ULTRAM) 50 MG tablet Take 1 tablet (50  mg total) by mouth every 6 (six) hours as needed. 90 tablet 2  . allopurinol (ZYLOPRIM) 300 MG tablet Take 1 tablet (300 mg total) by mouth daily. 30 tablet 5  . ALPRAZolam (XANAX) 1 MG tablet TAKE 1 TABLET(1 MG) BY MOUTH TWICE DAILY AS NEEDED FOR ANXIETY 180 tablet 0  . predniSONE (DELTASONE) 10 MG tablet Take 0.5 tablets (5 mg total) by mouth daily with breakfast. Pt taking 1/2 tablet 30 tablet 2  . Semaglutide,0.25 or 0.5MG /DOS, (OZEMPIC, 0.25 OR 0.5 MG/DOSE,) 2 MG/1.5ML SOPN Inject 0.1875 mLs (0.25 mg total) into the skin once a week. 2 mL 0   No facility-administered medications prior to visit.    ROS Review of Systems  Constitutional: Positive for unexpected weight change (wt gain). Negative  for chills, diaphoresis and fatigue.  HENT: Negative.   Eyes: Negative.  Negative for visual disturbance.  Respiratory: Negative for cough, chest tightness, shortness of breath and wheezing.   Cardiovascular: Positive for leg swelling. Negative for chest pain and palpitations.  Gastrointestinal: Negative for abdominal pain, constipation, diarrhea, nausea and vomiting.  Endocrine: Negative.  Negative for polydipsia, polyphagia and polyuria.  Genitourinary: Negative.  Negative for difficulty urinating and dysuria.  Musculoskeletal: Positive for arthralgias, back pain, gait problem and neck pain. Negative for joint swelling.  Skin: Negative.  Negative for color change and rash.  Neurological: Positive for numbness. Negative for dizziness, tremors, syncope, speech difficulty, weakness and headaches.  Hematological: Negative for adenopathy. Does not bruise/bleed easily.  Psychiatric/Behavioral: Positive for decreased concentration. Negative for agitation, behavioral problems, dysphoric mood, hallucinations and suicidal ideas. The patient is not nervous/anxious.     Objective:  BP 122/84   Pulse 81   Temp 98.1 F (36.7 C) (Oral)   Resp 16   Ht 5\' 10"  (1.778 m)   Wt 247 lb (112 kg)   SpO2 90%   BMI 35.44 kg/m   BP Readings from Last 3 Encounters:  11/28/20 122/84  10/29/20 (!) 152/101  09/24/20 132/76    Wt Readings from Last 3 Encounters:  11/28/20 247 lb (112 kg)  11/26/20 237 lb (107.5 kg)  10/29/20 237 lb (107.5 kg)    Physical Exam Vitals reviewed.  Constitutional:      Appearance: Normal appearance.  HENT:     Nose: Nose normal.     Mouth/Throat:     Mouth: Mucous membranes are moist.  Eyes:     General: No scleral icterus.    Conjunctiva/sclera: Conjunctivae normal.  Cardiovascular:     Rate and Rhythm: Normal rate and regular rhythm.     Heart sounds: No murmur heard.   Pulmonary:     Effort: Pulmonary effort is normal.     Breath sounds: No stridor.  Examination of the right-middle field reveals rhonchi. Examination of the left-middle field reveals rhonchi. Rhonchi present. No decreased breath sounds, wheezing or rales.  Abdominal:     General: Abdomen is protuberant. Bowel sounds are normal. There is no distension.     Palpations: Abdomen is soft. There is no hepatomegaly, splenomegaly or mass.     Tenderness: There is no abdominal tenderness.  Musculoskeletal:     Cervical back: Neck supple.     Right lower leg: 1+ Pitting Edema present.     Left lower leg: 1+ Pitting Edema present.  Skin:    General: Skin is warm and dry.     Coloration: Skin is not pale.     Findings: No bruising or rash.  Neurological:  General: No focal deficit present.     Mental Status: He is alert and oriented to person, place, and time. Mental status is at baseline.  Psychiatric:        Attention and Perception: He is inattentive.        Mood and Affect: Mood normal. Mood is not anxious or depressed.        Speech: Speech is tangential. Speech is not delayed.        Behavior: Behavior is slowed. Behavior is not withdrawn. Behavior is cooperative.        Thought Content: Thought content normal.        Cognition and Memory: Cognition normal.        Judgment: Judgment normal.     Lab Results  Component Value Date   WBC 7.9 11/28/2020   HGB 17.0 11/28/2020   HCT 51.5 11/28/2020   PLT 144.0 (L) 11/28/2020   GLUCOSE 110 (H) 11/28/2020   CHOL 177 07/10/2020   TRIG 150 (H) 07/10/2020   HDL 43 07/10/2020   LDLCALC 108 (H) 07/10/2020   ALT 31 08/07/2020   AST 25 08/07/2020   NA 141 11/28/2020   K 3.8 11/28/2020   CL 100 11/28/2020   CREATININE 1.13 11/28/2020   BUN 23 11/28/2020   CO2 36 (H) 11/28/2020   TSH 0.61 07/10/2020   PSA 1.7 07/10/2020   INR 1.0 04/15/2017   HGBA1C 6.6 (H) 11/28/2020    MR Pelvis w/o contrast  Result Date: 11/09/2020 CLINICAL DATA:  Inguinal/pelvic lymphadenopathy EXAM: MRI PELVIS WITHOUT CONTRAST TECHNIQUE:  Multiplanar multisequence MR imaging of the pelvis was performed. No intravenous contrast was administered. COMPARISON:  CT abdomen/pelvis dated 09/12/2020 FINDINGS: Urinary Tract:  Thick-walled bladder, although underdistended. Bowel:  Visualized bowel is grossly unremarkable. Vascular/Lymphatic: No evidence of aneurysm. Small bilateral inguinal nodes measuring up to 12 mm short axis, within normal limits and likely reactive. Reproductive: Prostate is notable for mild enlargement/nodularity central gland, suggesting BPH. Other:  No pelvic ascites. Musculoskeletal: No focal osseous lesions. IMPRESSION: Small inguinal nodes measuring up to 12 mm short axis, within normal limits and likely reactive. Electronically Signed   By: Julian Hy M.D.   On: 11/09/2020 23:31    Assessment & Plan:   Alfonza was seen today for diabetes and congestive heart failure.  Diagnoses and all orders for this visit:  Prediabetes- His A1c is up to 6.6%. -     Hemoglobin A1c; Future -     Basic metabolic panel; Future -     Basic metabolic panel -     Hemoglobin A1c  Idiopathic chronic gout of foot without tophus, unspecified laterality- His uric acid level is higher than it was before.  I suspect he is not taking allopurinol.  I have asked him to take the allopurinol as directed. -     Uric acid; Future -     Uric acid -     allopurinol (ZYLOPRIM) 300 MG tablet; Take 1 tablet (300 mg total) by mouth daily.  LAD (lymphadenopathy), inguinal- The work-up for secondary causes is unremarkable.  This appears to be a benign entity. -     CBC with Differential/Platelet; Future -     RPR; Future -     HIV antibody (with reflex) -     RPR -     CBC with Differential/Platelet  Primary hypertension- His blood pressure is adequately well controlled. -     CBC with Differential/Platelet; Future -  Basic metabolic panel; Future -     Basic metabolic panel -     CBC with Differential/Platelet  Type II diabetes  mellitus with manifestations ([place])- Will treat this with an SGLT2 inhibitor. -     dapagliflozin propanediol (FARXIGA) 10 MG TABS tablet; Take 1 tablet (10 mg total) by mouth daily before breakfast.  Chronic combined systolic and diastolic CHF (congestive heart failure) ([facility])- I think he would benefit from taking an SGLT2 inhibitor. -     dapagliflozin propanediol (FARXIGA) 10 MG TABS tablet; Take 1 tablet (10 mg total) by mouth daily before breakfast.  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  I have encouraged him to see a neurologist. -     Ambulatory referral to Neurology  Thrombocytopenia [facility])- He has a new onset slightly low platelet count.  There is no history of bleeding or bruising.  I have asked him to avoid alcohol and return in 3 months to have this rechecked.   I have discontinued Olene Craven. [place]'s Ozempic (0.25 or 0.5 MG/DOSE) and ALPRAZolam. I am also having him start on dapagliflozin propanediol. Additionally, I am having him maintain his Azelastine-Fluticasone, cholecalciferol, torsemide, roflumilast, ezetimibe, omeprazole, NovoFine, Colchicine, EPINEPHrine, Dulera, montelukast, losartan, hydrALAZINE, traMADol, albuterol, albuterol, Spiriva Respimat, predniSONE, and allopurinol.  Meds ordered this encounter  Medications  . allopurinol (ZYLOPRIM) 300 MG tablet    Sig: Take 1 tablet (300 mg total) by mouth daily.    Dispense:  90 tablet    Refill:  1  . dapagliflozin propanediol (FARXIGA) 10 MG TABS tablet    Sig: Take 1 tablet (10 mg total) by mouth daily before breakfast.    Dispense:  90 tablet    Refill:  1   I spent 50 minutes in preparing to see the patient by review of recent labs, imaging and procedures, obtaining and reviewing separately obtained history, communicating with the patient and family or caregiver, ordering medications, tests or procedures, and documenting clinical information in the EHR including the differential Dx,  treatment, and any further evaluation and other management of 1. Prediabetes 2. Idiopathic chronic gout of foot without tophus, unspecified laterality 3. LAD (lymphadenopathy), inguinal 4. Primary hypertension 5. Type II diabetes mellitus with manifestations ([place]) 6. Chronic combined systolic and diastolic CHF (congestive heart failure) ([facility]) 7. Ataxia 8. Thrombocytopenia ([place])     Follow-up: Return in about 6 months (around 05/29/2021).  Scarlette Calico, MD

## 2020-11-28 NOTE — Patient Instructions (Signed)

## 2020-11-29 ENCOUNTER — Encounter: Payer: Self-pay | Admitting: Internal Medicine

## 2020-11-29 DIAGNOSIS — E118 Type 2 diabetes mellitus with unspecified complications: Secondary | ICD-10-CM | POA: Insufficient documentation

## 2020-11-29 DIAGNOSIS — D696 Thrombocytopenia, unspecified: Secondary | ICD-10-CM | POA: Insufficient documentation

## 2020-11-29 DIAGNOSIS — R270 Ataxia, unspecified: Secondary | ICD-10-CM | POA: Insufficient documentation

## 2020-11-29 LAB — HIV ANTIBODY (ROUTINE TESTING W REFLEX): HIV 1&2 Ab, 4th Generation: NONREACTIVE

## 2020-11-29 LAB — RPR: RPR Ser Ql: NONREACTIVE

## 2020-11-29 MED ORDER — ALLOPURINOL 300 MG PO TABS: 300.0000 mg | ORAL_TABLET | Freq: Every day | ORAL | 1 refills | Status: DC

## 2020-11-29 MED ORDER — DAPAGLIFLOZIN PROPANEDIOL 10 MG PO TABS: 10.0000 mg | ORAL_TABLET | Freq: Every day | ORAL | 1 refills | Status: DC

## 2020-12-02 ENCOUNTER — Other Ambulatory Visit: Payer: Self-pay | Admitting: *Deleted

## 2020-12-02 ENCOUNTER — Telehealth: Payer: Self-pay | Admitting: Internal Medicine

## 2020-12-02 DIAGNOSIS — I872 Venous insufficiency (chronic) (peripheral): Secondary | ICD-10-CM

## 2020-12-02 DIAGNOSIS — R600 Localized edema: Secondary | ICD-10-CM

## 2020-12-02 NOTE — Telephone Encounter (Signed)
Patient is requesting a call back in regards to his recent lab work. He can be reached at [phone]

## 2020-12-03 NOTE — Telephone Encounter (Signed)
I have called the pt and went over lab notations from 12/10 letter.  Per PCP:  "Your blood sugar has gone up and you now have diabetes mellitus type 2.  I sent a prescription to your pharmacy called [facility] to treat this.  [facility] also helps with heart failure.  Your platelet count is low.  Please avoid alcohol.  I would like to recheck your platelet count in about 3 months.  Your gout level is too high.  Please take the allopurinol as directed.  The other labs are okay.  I do not see any other concerns here."   Pt has been informed and expressed understanding.

## 2020-12-03 NOTE — Telephone Encounter (Signed)
Called pt, LVM.   

## 2020-12-03 NOTE — Telephone Encounter (Signed)
° ° °  Please return call to patient to discuss lab results °

## 2020-12-04 ENCOUNTER — Telehealth: Payer: Self-pay | Admitting: Internal Medicine

## 2020-12-04 ENCOUNTER — Other Ambulatory Visit: Payer: Self-pay | Admitting: Internal Medicine

## 2020-12-04 DIAGNOSIS — E118 Type 2 diabetes mellitus with unspecified complications: Secondary | ICD-10-CM

## 2020-12-04 MED ORDER — SEGLUROMET 2.5-500 MG PO TABS: 1.0000 | ORAL_TABLET | Freq: Two times a day (BID) | ORAL | 0 refills | Status: DC

## 2020-12-04 NOTE — Telephone Encounter (Signed)
Patient called and said his insurance would not cover dapagliflozin propanediol (FARXIGA) 10 MG TABS tablet  He was wondering if something else could be called in. It can be sent to [facility], [place]

## 2020-12-04 NOTE — Telephone Encounter (Signed)
Try segluromet RX sent

## 2020-12-04 NOTE — Telephone Encounter (Signed)
Called pt, Robert Church.   

## 2020-12-05 ENCOUNTER — Ambulatory Visit (INDEPENDENT_AMBULATORY_CARE_PROVIDER_SITE_OTHER): Payer: 59 | Admitting: Emergency Medicine

## 2020-12-05 ENCOUNTER — Ambulatory Visit: Payer: 59

## 2020-12-05 ENCOUNTER — Other Ambulatory Visit: Payer: Self-pay

## 2020-12-05 ENCOUNTER — Telehealth: Payer: Self-pay | Admitting: Internal Medicine

## 2020-12-05 ENCOUNTER — Other Ambulatory Visit: Payer: Self-pay | Admitting: Internal Medicine

## 2020-12-05 ENCOUNTER — Inpatient Hospital Stay (HOSPITAL_COMMUNITY): Admission: RE | Admit: 2020-12-05 | Payer: 59 | Source: Ambulatory Visit

## 2020-12-05 ENCOUNTER — Other Ambulatory Visit: Payer: Self-pay | Admitting: Emergency Medicine

## 2020-12-05 ENCOUNTER — Encounter: Payer: Self-pay | Admitting: Emergency Medicine

## 2020-12-05 DIAGNOSIS — J302 Other seasonal allergic rhinitis: Secondary | ICD-10-CM | POA: Diagnosis not present

## 2020-12-05 DIAGNOSIS — G4733 Obstructive sleep apnea (adult) (pediatric): Secondary | ICD-10-CM

## 2020-12-05 DIAGNOSIS — F411 Generalized anxiety disorder: Secondary | ICD-10-CM

## 2020-12-05 DIAGNOSIS — J449 Chronic obstructive pulmonary disease, unspecified: Secondary | ICD-10-CM

## 2020-12-05 MED ORDER — BREZTRI AEROSPHERE 160-9-4.8 MCG/ACT IN AERO: 2.0000 | INHALATION_SPRAY | Freq: Two times a day (BID) | RESPIRATORY_TRACT | 0 refills | Status: DC

## 2020-12-05 NOTE — Assessment & Plan Note (Signed)
Marginal compliance with his CPAP.  He is interested in being evaluated for the inspire device.  We need to get his COPD stabilized, suppose he may be a candidate.  We can communicate with Dr. Gwenlyn Found with cardiology going forward.

## 2020-12-05 NOTE — Progress Notes (Signed)
Subjective:  Patient ID: Robert Church, male    DOB: 09-06-1956  Age: 64 y.o. MRN: [id number]  CC:  No chief complaint on file.   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  He has been managed on Spiriva, Dulera and daily prednisone 5 mg daily, Daliresp.  He uses albuterol.  Due for lung cancer screening CT in March 2022. He reports continued cough, thick clear mucous. A little sinus clear mucous but "not bad".  When he has fits of coughing he becomes dyspneic. Gets waked up at night to cough of mucous some times. He took levaquin in August, benefited from it. He remains on astelin/flonase nasal spray, mucinex, singulair. Has allergies to multiple abx.   ROV 12/05/20 --Robert Church is 64, has a history of tobacco use, active smoker, with severe COPD and a chronic bronchitic phenotype with purulent sputum.  He is on daily maintenance prednisone, Spiriva, Dulera, Singulair, daliresp. Albuterol 3-4x a day.  I increased his prednisone dose to 10 mg daily at our most recent phone visit 10/2020. Would also plan to perform a walking oximetry to determine Also with a history of rhinitis, uses Astelin/fluticasone nasal spray, Mucinex. He continues to smoke 3-4 cig a day.  He has been experiencing persistent cough, mucous production, persistent exertional SOB.  He is having a gout flare, is having lower extremity edema and pain following a recent fall.  He does not believe that he can walk today to undergo oximetry.  I do believe that he may qualify whenever we can get a walking oximetry done.  MDM:  Reviewed orthopedics notes from 10/29/2020 Reviewed ED note from 10/29/2020, 10/31/2020, 11/26/2020 Reviewed medicine note from 11/28/2020   ROS Review of Systems  Constitutional: Negative for activity change, fatigue, fever and unexpected weight change.  HENT: Negative for congestion, postnasal drip, rhinorrhea and  sinus pain.   Eyes: Negative for redness.  Respiratory: Positive for cough, shortness of breath and wheezing. Negative for apnea, choking, chest tightness and stridor.   Cardiovascular: Negative for chest pain, palpitations and leg swelling.  Gastrointestinal: Positive for abdominal distention. Negative for abdominal pain, diarrhea and nausea.  Endocrine: Negative for polyuria.  Genitourinary: Negative for dysuria and hematuria.  Musculoskeletal: Negative for back pain, gait problem and myalgias.  Skin: Negative for rash and wound.  Neurological: Negative for dizziness, seizures, weakness and headaches.  Psychiatric/Behavioral: Negative for agitation and self-injury.    Objective:   Today's Vitals: BP (!) 152/84 (BP Location: Right Arm, Cuff Size: Normal)   Pulse 86   Temp (!) 97.4 F (36.3 C)   Ht 5\' 10"  (1.778 m)   Wt 256 lb (116.1 kg)   SpO2 94%   BMI 36.73 kg/m   Gen: Pleasant, well-nourished, in no distress,  normal affect  ENT: No lesions,  mouth clear,  oropharynx clear, no postnasal drip  Neck: No JVD, no stridor  Lungs: No use of accessory muscles, no crackles, B exp wheezes.   Cardiovascular: RRR, heart sounds normal, no murmur or gallops, no peripheral edema  Musculoskeletal: No deformities, no cyanosis or clubbing  Neuro: alert, awake, non focal  Skin: Warm, no lesions or rash   Assessment & Plan:  COPD, group D, by GOLD 2017 classification ([name]) He has diabetes that is progressing based on IM notes, certainly his prednisone is a contributor.  I had like to try and wean if possible but have been  unable thus far.  Rotating antibiotics might be beneficial allow [place] to do so but he has multiple drug allergies, can only tolerate levofloxacin.  I will continue to try to wean his prednisone, possibly will be able to make more progress if he can stop smoking.  Temporarily stop your Dulera and Spiriva. Start Breztri 2 puffs twice a day.  Rinse and gargle after using.   Let us know if you get more benefit from this medication and we will send prescription to your pharmacy. Continue to use your albuterol inhaler and nebulizer when you need them as you have been doing. Continue Daliresp as you have been taking it Continue Singulair as you have been taking it Continue prednisone 10 mg once daily.y Continue Mucinex We need to perform a walking oxygen test when you are able to do so. Follow with [name] in 2 months or sooner if you have any problems.   Obstructive sleep apnea syndrome Marginal compliance with his CPAP.  He is interested in being evaluated for the inspire device.  We need to get his COPD stabilized, suppose he may be a candidate.  We can communicate with Dr. Gwenlyn Found with cardiology going forward.  Seasonal allergic rhinitis Continue azelastine/fluticasone spray    Baltazar Apo, MD, PhD 12/05/2020, 4:35 PM [facility] [phone] or if no answer [phone]

## 2020-12-05 NOTE — Patient Instructions (Addendum)
Temporarily stop your Dulera and Spiriva. Start Breztri 2 puffs twice a day.  Rinse and gargle after using.  Let us know if you get more benefit from this medication and we will send prescription to your pharmacy. Continue to use your albuterol inhaler and nebulizer when you need them as you have been doing. Continue Daliresp as you have been taking it Continue Singulair as you have been taking it Continue prednisone 10 mg once daily. Continue your azelastine/reticular nasal spray Continue Mucinex We need to perform a walking oxygen test when you are able to do so. Congratulations on decreasing your cigarettes.  Our goal will be to continue to decrease and ultimately stop altogether. Follow with [name] in 2 months or sooner if you have any problems.

## 2020-12-05 NOTE — Assessment & Plan Note (Addendum)
Continue azelastine/fluticasone spray

## 2020-12-05 NOTE — Telephone Encounter (Signed)
Refill request received. Pt has an OV at 4:15 today 12/16 with RB. Routing to RB to see if he wants to keep pt on the prednisone.

## 2020-12-05 NOTE — Telephone Encounter (Signed)
Ertugliflozin-metFORMIN HCl (SEGLUROMET) 2.5-500 MG TABS  Patient states [facility] [place], [place] (Ph: [phone]) said he needed a PA for this medication

## 2020-12-05 NOTE — Assessment & Plan Note (Addendum)
He has diabetes that is progressing based on IM notes, certainly his prednisone is a contributor.  I had like to try and wean if possible but have been unable thus far.  Rotating antibiotics might be beneficial allow [place] to do so but he has multiple drug allergies, can only tolerate levofloxacin.  I will continue to try to wean his prednisone, possibly will be able to make more progress if he can stop smoking.  Temporarily stop your Dulera and Spiriva. Start Breztri 2 puffs twice a day.  Rinse and gargle after using.  Let us know if you get more benefit from this medication and we will send prescription to your pharmacy. Continue to use your albuterol inhaler and nebulizer when you need them as you have been doing. Continue Daliresp as you have been taking it Continue Singulair as you have been taking it Continue prednisone 10 mg once daily.y Continue Mucinex We need to perform a walking oxygen test when you are able to do so. Follow with [name] in 2 months or sooner if you have any problems.

## 2020-12-05 NOTE — Addendum Note (Signed)
Addended by: Gavin Potters R on: 12/05/2020 04:41 PM   Modules accepted: Orders

## 2020-12-06 ENCOUNTER — Telehealth: Payer: Self-pay | Admitting: Internal Medicine

## 2020-12-06 ENCOUNTER — Other Ambulatory Visit: Payer: Self-pay | Admitting: Internal Medicine

## 2020-12-06 DIAGNOSIS — M170 Bilateral primary osteoarthritis of knee: Secondary | ICD-10-CM

## 2020-12-06 MED ORDER — NABUMETONE 500 MG PO TABS: 500.0000 mg | ORAL_TABLET | Freq: Two times a day (BID) | ORAL | 1 refills | Status: DC | PRN

## 2020-12-06 NOTE — Telephone Encounter (Signed)
RX sent for nabumetone - safer than naproxen

## 2020-12-06 NOTE — Telephone Encounter (Signed)
Called pt, LVM.   

## 2020-12-06 NOTE — Telephone Encounter (Signed)
Key: [id number]

## 2020-12-06 NOTE — Telephone Encounter (Signed)
   Patient requesting new order for Naproxen for arthritis pain

## 2020-12-09 ENCOUNTER — Other Ambulatory Visit: Payer: Self-pay | Admitting: Internal Medicine

## 2020-12-09 ENCOUNTER — Telehealth: Payer: Self-pay | Admitting: Internal Medicine

## 2020-12-09 DIAGNOSIS — E118 Type 2 diabetes mellitus with unspecified complications: Secondary | ICD-10-CM

## 2020-12-09 MED ORDER — METFORMIN HCL ER 750 MG PO TB24: 750.0000 mg | ORAL_TABLET | Freq: Every day | ORAL | 1 refills | Status: DC

## 2020-12-09 NOTE — Telephone Encounter (Signed)
Called pt, LVM.   

## 2020-12-09 NOTE — Telephone Encounter (Signed)
Patient is requesting a referral to [facility] for his legs. He said he fell a few months ago and from the waste down he said he has had some swelling.

## 2020-12-09 NOTE — Telephone Encounter (Signed)
He will have to ask his current vascular surgeon about this

## 2020-12-09 NOTE — Telephone Encounter (Signed)
New RX sent

## 2020-12-09 NOTE — Telephone Encounter (Signed)
Determination resulted in a denial.

## 2020-12-11 ENCOUNTER — Ambulatory Visit: Payer: 59 | Admitting: Internal Medicine

## 2020-12-11 ENCOUNTER — Telehealth: Payer: Self-pay | Admitting: Emergency Medicine

## 2020-12-11 ENCOUNTER — Telehealth: Payer: Self-pay | Admitting: Internal Medicine

## 2020-12-11 MED ORDER — SPIRIVA RESPIMAT 2.5 MCG/ACT IN AERS: 2.5000 ug | INHALATION_SPRAY | Freq: Two times a day (BID) | RESPIRATORY_TRACT | 11 refills | Status: DC

## 2020-12-11 MED ORDER — DULERA 200-5 MCG/ACT IN AERO: INHALATION_SPRAY | RESPIRATORY_TRACT | 11 refills | Status: DC

## 2020-12-11 MED ORDER — PREDNISONE 10 MG PO TABS: 10.0000 mg | ORAL_TABLET | Freq: Every day | ORAL | 2 refills | Status: DC

## 2020-12-11 MED ORDER — ALBUTEROL SULFATE HFA 108 (90 BASE) MCG/ACT IN AERS: 2.0000 | INHALATION_SPRAY | Freq: Four times a day (QID) | RESPIRATORY_TRACT | 2 refills | Status: DC | PRN

## 2020-12-11 NOTE — Telephone Encounter (Signed)
Called and spoke with Robert Church and he stated that he fell about 1.5 months ago at home.  He fell 3 times within 2 weeks.  After these falls, his legs started to swell and this swelling is all the way up his legs.  He is worried about blood clots in his lungs.  He is wanting to know if he can have any xray or ct to check this out.  He does have a lot of congestion for about 2 months with clear mucus.    He is needing a refill of the prednisone if he needs to stay on this.    Robert Church was last seen by RB--12/05/2020  Allergies  Allergen Reactions  . Clindamycin Other (See Comments)    Tongue swelling  . Doxycycline     Severe stomach upset per patient  . E-Mycin [Erythromycin Base] Swelling  . Penicillins Swelling and Other (See Comments)    Childhood rxn--MD stated he "almost died" Has patient had a PCN reaction causing immediate rash, facial/tongue/throat swelling, SOB or lightheadedness with hypotension:Yes Has patient had a PCN reaction causing severe rash involving mucus membranes or skin necrosis:unsure Has patient had a PCN reaction that required hospitalization:unsure Has patient had a PCN reaction occurring within the last 10 years:No If all of the above answers are "NO", then may proceed with Cephalosporin use.    . Rosuvastatin     Other reaction(s): Myalgias (intolerance)     Current Outpatient Medications on File Prior to Visit  Medication Sig Dispense Refill  . albuterol (PROVENTIL) (2.5 MG/3ML) 0.083% nebulizer solution Take 3 mLs (2.5 mg total) by nebulization every 6 (six) hours as needed for wheezing or shortness of breath. 75 mL 12  . albuterol (VENTOLIN HFA) 108 (90 Base) MCG/ACT inhaler Inhale 2 puffs into the lungs every 6 (six) hours as needed for wheezing. 18 g 5  . allopurinol (ZYLOPRIM) 300 MG tablet Take 1 tablet (300 mg total) by mouth daily. 90 tablet 1  . Azelastine-Fluticasone 137-50 MCG/ACT SUSP 1 spray each nostril twice daily 23 g 6  .  Budeson-Glycopyrrol-Formoterol (BREZTRI AEROSPHERE) 160-9-4.8 MCG/ACT AERO Inhale 2 puffs into the lungs in the morning and at bedtime. 5.9 g 0  . cholecalciferol (VITAMIN D) 1000 units tablet Take 1,000 Units by mouth daily.    . Colchicine (MITIGARE) 0.6 MG CAPS Take 1 tablet by mouth 2 (two) times daily. 60 capsule 5  . EPINEPHrine 0.3 mg/0.3 mL IJ SOAJ injection Inject 0.3 mLs (0.3 mg total) into the muscle as needed for anaphylaxis. 2 each 2  . ezetimibe (ZETIA) 10 MG tablet Take 1 tablet (10 mg total) by mouth daily. 90 tablet 1  . hydrALAZINE (APRESOLINE) 10 MG tablet Take 1 tablet (10 mg total) by mouth in the morning and at bedtime. 180 tablet 3  . Insulin Pen Needle (NOVOFINE) 32G X 6 MM MISC 1 Act by Does not apply route once a week. 50 each 0  . losartan (COZAAR) 100 MG tablet Take 1 tablet (100 mg total) by mouth daily. 90 tablet 1  . metFORMIN (GLUCOPHAGE XR) 750 MG 24 hr tablet Take 1 tablet (750 mg total) by mouth daily with breakfast. 90 tablet 1  . mometasone-formoterol (DULERA) 200-5 MCG/ACT AERO Take 2 puffs first thing in am and then another 2 puffs about 12 hours later. 13 g 11  . montelukast (SINGULAIR) 10 MG tablet Take 1 tablet (10 mg total) by mouth at bedtime. 30 tablet 2  . nabumetone (RELAFEN) 500 MG  tablet Take 1 tablet (500 mg total) by mouth 2 (two) times daily as needed. 180 tablet 1  . omeprazole (PRILOSEC) 20 MG capsule Take 1 capsule (20 mg total) by mouth 2 (two) times daily before a meal. 180 capsule 1  . predniSONE (DELTASONE) 10 MG tablet Take 1 tablet (10 mg total) by mouth daily with breakfast. 30 tablet 0  . roflumilast (DALIRESP) 500 MCG TABS tablet Take 1 tablet (500 mcg total) by mouth daily. 30 tablet 3  . Tiotropium Bromide Monohydrate (SPIRIVA RESPIMAT) 2.5 MCG/ACT AERS Inhale 2.5 mcg into the lungs 2 (two) times daily. 4 g 11  . torsemide (DEMADEX) 20 MG tablet Take 2 tablets (40 mg total) by mouth daily. 180 tablet 3  . traMADol (ULTRAM) 50 MG  tablet Take 1 tablet (50 mg total) by mouth every 6 (six) hours as needed. 90 tablet 2   No current facility-administered medications on file prior to visit.

## 2020-12-11 NOTE — Telephone Encounter (Addendum)
Spoke with the pt and notified of response per Dr Lamonte Sakai  I have refilled his pred and inhalers per his request  He states that he already had neg dopplers that [facility] ordered on 10/30/20 and is scheduled for dopp[facility] to [facility]  I have him scheduled for appt with BW on 12/23/20

## 2020-12-11 NOTE — Telephone Encounter (Signed)
Patient wondering if he can get an x-ray of his back. Patient states his leg swelling is still very bad and he states hes been evaluated by [place] and ortho care about this and the only thing that hasn't been checked was his back. States he said all these problems are coming from his fall.

## 2020-12-11 NOTE — Telephone Encounter (Signed)
Plan is for him to stay on pred 10mg  daily  OK to order B LE duplex US to evaluate for DVT. If he continues to have swelling then we may need to do other workup. We will need to review - set him up with RB or APP to discuss

## 2020-12-12 ENCOUNTER — Other Ambulatory Visit: Payer: Self-pay

## 2020-12-12 ENCOUNTER — Ambulatory Visit (INDEPENDENT_AMBULATORY_CARE_PROVIDER_SITE_OTHER): Payer: 59 | Admitting: Physician Assistant

## 2020-12-12 ENCOUNTER — Ambulatory Visit (HOSPITAL_COMMUNITY)
Admission: RE | Admit: 2020-12-12 | Discharge: 2020-12-12 | Disposition: A | Discharge status: Home / Self Care | Payer: 59 | Source: Ambulatory Visit | Attending: Physician Assistant | Admitting: Physician Assistant

## 2020-12-12 VITALS — BP 147/99 | HR 90 | Temp 98.5°F | Resp 20 | Ht 70.0 in | Wt 256.0 lb

## 2020-12-12 DIAGNOSIS — L03119 Cellulitis of unspecified part of limb: Secondary | ICD-10-CM

## 2020-12-12 DIAGNOSIS — L03311 Cellulitis of abdominal wall: Secondary | ICD-10-CM

## 2020-12-12 DIAGNOSIS — R600 Localized edema: Secondary | ICD-10-CM

## 2020-12-12 DIAGNOSIS — M546 Pain in thoracic spine: Secondary | ICD-10-CM

## 2020-12-12 DIAGNOSIS — I872 Venous insufficiency (chronic) (peripheral): Secondary | ICD-10-CM

## 2020-12-12 MED ORDER — CIPROFLOXACIN HCL 500 MG PO TABS: 500.0000 mg | ORAL_TABLET | Freq: Two times a day (BID) | ORAL | 1 refills | Status: DC

## 2020-12-12 NOTE — Progress Notes (Signed)
[facility] [place]   Reason for referral: Swollen B legs  History of Present Illness  Robert Church is a 64 y.o. male who presents with chief complaint: swollen leg.  He was seen by Dr. Scot Dock on 05/22/20 with diagnosis of suspected venous insufficiency.  He was placed in knee high 15-20 mm hg compression instruction to exercise, elevate when at rest and to avoid prolonged sitting and standing in a dependent position.    He is here today for formal reflux  study.  He fell off a bar stool about 2 months ago and has developed sever edema from his upper abdomin, scrotal swelling and B LE woody edema.  He complains of upper back, low back, abdominal tightness, left > right swelling with weeping from his skin.  He is having difficulty ambulating.  He is unable to put his compression on now for his previous LE swelling.  He has no history of lymphedema. He denise fever and chills, SOP or CP.  He does have a history of gout.    Past Medical History:  Diagnosis Date  . Allergy   . Asthma   . Bronchitis   . COPD (chronic obstructive pulmonary disease) ([place])   . GERD (gastroesophageal reflux disease)   . Hypertension   . Pneumonia   . Sinusitis   . SOB (shortness of breath)     Past Surgical History:  Procedure Laterality Date  . LEFT HEART CATH AND CORONARY ANGIOGRAPHY N/A 04/19/2017   Procedure: Left Heart Cath and Coronary Angiography;  Surgeon: Belva Crome, MD;  Location: [facility];  Service: Cardiovascular;  Laterality: N/A;  . TONSILLECTOMY      Social History   Socioeconomic History  . Marital status: Single    Spouse name: Not on file  . Number of children: Not on file  . Years of education: Not on file  . Highest education level: Not on file  Occupational History  . Occupation: Dealer  Tobacco Use  . Smoking status: Current Every Day Smoker    Packs/day: 0.25    Years: 46.00    Pack years: 11.50    Types: Cigarettes  . Smokeless  tobacco: Never Used  . Tobacco comment: 3 cigarettes smoked daily  [facility] 12/05/20  Vaping Use  . Vaping Use: Never used  Substance and Sexual Activity  . Alcohol use: Yes    Alcohol/week: 0.0 standard drinks  . Drug use: No  . Sexual activity: Yes    Birth control/protection: None  Other Topics Concern  . Not on file  Social History Narrative  . Not on file   Social Determinants of Health   Financial Resource Strain: Not on file  Food Insecurity: Not on file  Transportation Needs: Not on file  Physical Activity: Not on file  Stress: Not on file  Social Connections: Not on file  Intimate Partner Violence: Not on file    Family History  Problem Relation Age of Onset  . Lung cancer Father   . Brain cancer Father     Current Outpatient Medications on File Prior to Visit  Medication Sig Dispense Refill  . albuterol (PROVENTIL) (2.5 MG/3ML) 0.083% nebulizer solution Take 3 mLs (2.5 mg total) by nebulization every 6 (six) hours as needed for wheezing or shortness of breath. 75 mL 12  . albuterol (VENTOLIN HFA) 108 (90 Base) MCG/ACT inhaler Inhale 2 puffs into the lungs every 6 (six) hours as needed for wheezing. 18 g 2  .  allopurinol (ZYLOPRIM) 300 MG tablet Take 1 tablet (300 mg total) by mouth daily. 90 tablet 1  . Azelastine-Fluticasone 137-50 MCG/ACT SUSP 1 spray each nostril twice daily 23 g 6  . Budeson-Glycopyrrol-Formoterol (BREZTRI AEROSPHERE) 160-9-4.8 MCG/ACT AERO Inhale 2 puffs into the lungs in the morning and at bedtime. 5.9 g 0  . cholecalciferol (VITAMIN D) 1000 units tablet Take 1,000 Units by mouth daily.    . Colchicine (MITIGARE) 0.6 MG CAPS Take 1 tablet by mouth 2 (two) times daily. 60 capsule 5  . EPINEPHrine 0.3 mg/0.3 mL IJ SOAJ injection Inject 0.3 mLs (0.3 mg total) into the muscle as needed for anaphylaxis. 2 each 2  . ezetimibe (ZETIA) 10 MG tablet Take 1 tablet (10 mg total) by mouth daily. 90 tablet 1  . hydrALAZINE (APRESOLINE) 10 MG tablet Take 1  tablet (10 mg total) by mouth in the morning and at bedtime. 180 tablet 3  . Insulin Pen Needle (NOVOFINE) 32G X 6 MM MISC 1 Act by Does not apply route once a week. 50 each 0  . losartan (COZAAR) 100 MG tablet Take 1 tablet (100 mg total) by mouth daily. 90 tablet 1  . metFORMIN (GLUCOPHAGE XR) 750 MG 24 hr tablet Take 1 tablet (750 mg total) by mouth daily with breakfast. 90 tablet 1  . mometasone-formoterol (DULERA) 200-5 MCG/ACT AERO Take 2 puffs first thing in am and then another 2 puffs about 12 hours later. 13 g 11  . montelukast (SINGULAIR) 10 MG tablet Take 1 tablet (10 mg total) by mouth at bedtime. 30 tablet 2  . nabumetone (RELAFEN) 500 MG tablet Take 1 tablet (500 mg total) by mouth 2 (two) times daily as needed. 180 tablet 1  . omeprazole (PRILOSEC) 20 MG capsule Take 1 capsule (20 mg total) by mouth 2 (two) times daily before a meal. 180 capsule 1  . predniSONE (DELTASONE) 10 MG tablet Take 1 tablet (10 mg total) by mouth daily with breakfast. 30 tablet 2  . roflumilast (DALIRESP) 500 MCG TABS tablet Take 1 tablet (500 mcg total) by mouth daily. 30 tablet 3  . Tiotropium Bromide Monohydrate (SPIRIVA RESPIMAT) 2.5 MCG/ACT AERS Inhale 2.5 mcg into the lungs 2 (two) times daily. 4 g 11  . torsemide (DEMADEX) 20 MG tablet Take 2 tablets (40 mg total) by mouth daily. 180 tablet 3  . traMADol (ULTRAM) 50 MG tablet Take 1 tablet (50 mg total) by mouth every 6 (six) hours as needed. 90 tablet 2   No current facility-administered medications on file prior to visit.    Allergies as of 12/12/2020 - Review Complete 12/12/2020  Allergen Reaction Noted  . Clindamycin Other (See Comments) 05/30/2018  . Doxycycline  03/08/2020  . E-mycin [erythromycin base] Swelling 07/23/2011  . Penicillins Swelling and Other (See Comments) 07/23/2011  . Rosuvastatin  03/30/2018     ROS:   General:  No weight loss, Fever, chills  HEENT: No recent headaches, no nasal bleeding, no visual changes, no sore  throat  Neurologic: No dizziness, blackouts, seizures. No recent symptoms of stroke or mini- stroke. No recent episodes of slurred speech, or temporary blindness.  Cardiac: No recent episodes of chest pain/pressure, no shortness of breath at rest.  No shortness of breath with exertion.  Denies history of atrial fibrillation or irregular heartbeat  Vascular: No history of rest pain in feet.  No history of claudication.  No history of non-healing ulcer, No history of DVT   Pulmonary: No home oxygen, no  productive cough, no hemoptysis,  No asthma or wheezing  Musculoskeletal:  [ ]  Arthritis, [x ] Low back pain,  [x ] Joint pain  Hematologic:No history of hypercoagulable state.  No history of easy bleeding.  No history of anemia  Gastrointestinal: No hematochezia or melena,  No gastroesophageal reflux, no trouble swallowing  Urinary: [ ]  chronic Kidney disease, [ ]  on HD - [ ]  MWF or [ ]  TTHS, [ ]  Burning with urination, [ ]  Frequent urination, [ ]  Difficulty urinating;   Skin: No rashes  Psychological: No history of anxiety,  No history of depression  Physical Examination  Vitals:   12/12/20 1450  BP: (!) 147/99  Pulse: 90  Resp: 20  Temp: 98.5 F (36.9 C)  SpO2: 94%  Weight: 256 lb (116.1 kg)  Height: 5\' 10"  (1.778 m)    Body mass index is 36.73 kg/m.  General:  Alert and oriented, no acute distress HEENT: Normal Neck: No bruit or JVD Pulmonary: Clear to auscultation bilaterally Cardiac: Regular Rate and Rhythm without murmur Abdomen: Soft, non-tender, non-distended, no mass, no scars Skin: No rash Extremity Pulses:  2+ radial, brachial, femoral, Doppler signals dorsalis pedis, posterior tibial  Bilaterally  His feet up to his mid abdomin are tight with a woody feel.  Cellulitis in B LE to his abdomin.  He also has significant scrotal swelling.  There is warmth to touch in these areas as well. Musculoskeletal: No deformity or edema  Neurologic: Upper and lower extremity  motor 5/5 and symmetric      DATA: Venous Reflux Times  +--------------+---------+------+-----------+------------+--------+  RIGHT     Reflux NoRefluxReflux TimeDiameter cmsComments               Yes                   +--------------+---------+------+-----------+------------+--------+  CFV      no                         +--------------+---------+------+-----------+------------+--------+  FV prox    no                         +--------------+---------+------+-----------+------------+--------+  FV mid    no                         +--------------+---------+------+-----------+------------+--------+  FV dist    no                         +--------------+---------+------+-----------+------------+--------+  Popliteal   no                         +--------------+---------+------+-----------+------------+--------+  GSV at [facility]  no               0.667        +--------------+---------+------+-----------+------------+--------+  GSV prox thighno               0.637        +--------------+---------+------+-----------+------------+--------+  GSV mid thigh no               0.584        +--------------+---------+------+-----------+------------+--------+  GSV dist thighno               0.524        +--------------+---------+------+-----------+------------+--------+  GSV at knee  no  0.553        +--------------+---------+------+-----------+------------+--------+  GSV prox calf                 0.467        +--------------+---------+------+-----------+------------+--------+  GSV mid calf                 0.445         +--------------+---------+------+-----------+------------+--------+  SSV Pop Fossa no               0.577        +--------------+---------+------+-----------+------------+--------+  SSV prox calf no               0.326        +--------------+---------+------+-----------+------------+--------+  SSV mid calf                 0.425        +--------------+---------+------+-----------+------------+--------+     +--------------+---------+------+-----------+------------+--------+  LEFT     Reflux NoRefluxReflux TimeDiameter cmsComments               Yes                   +--------------+---------+------+-----------+------------+--------+  CFV      no                         +--------------+---------+------+-----------+------------+--------+  FV prox    no                         +--------------+---------+------+-----------+------------+--------+  FV mid    no                         +--------------+---------+------+-----------+------------+--------+  FV dist    no                         +--------------+---------+------+-----------+------------+--------+  Popliteal   no                         +--------------+---------+------+-----------+------------+--------+  GSV at SFJ  no               0.653        +--------------+---------+------+-----------+------------+--------+  GSV prox thighno               0.424        +--------------+---------+------+-----------+------------+--------+  GSV mid thigh no               0.467        +--------------+---------+------+-----------+------------+--------+  GSV dist thighno                0.504        +--------------+---------+------+-----------+------------+--------+  GSV at knee  no               0.467        +--------------+---------+------+-----------+------------+--------+  GSV prox calf                 0.361        +--------------+---------+------+-----------+------------+--------+  GSV mid calf                 0.392        +--------------+---------+------+-----------+------------+--------+  SSV Pop Fossa no               0.744        +--------------+---------+------+-----------+------------+--------+  SSV prox calf no  0.468        +--------------+---------+------+-----------+------------+--------+  SSV mid calf                 0.394        +--------------+---------+------+-----------+------------+--------+   Summary:  Bilateral:  - No evidence of deep vein thrombosis seen in the lower extremities,  bilaterally, from the common femoral through the popliteal veins.  - No evidence of superficial venous thrombosis in the lower extremities,  bilaterally.  - No evidence of deep venous insufficiency seen bilaterally in the lower  extremity.  - No evidence of superficial venous reflux seen in the greater saphenous  veins bilaterally.  - No evidence of superficial venous reflux seen in the short saphenous  veins bilaterally.   Assessment: Thoracic, lumbar, abdominal, scrotal and B LE pain with edema and cellulitis with difficulty moving and resting due to the pain.  He takes multiple dose of BC powders daily. The reflux study is negative and he does not have a DVT. He does have history of gout  Plan:He will lay in the supine position and elevate his LE.  We will order CTA Chest /Abd/Pelvis to look for possible compression causing lymphedema.  I have sent a prescription for Ciprofloxacin 500 mg  BID for 2 weeks for the cellulitis.  The report will be reviewed by Dr. Scot Dock.  He will f/u with Dr. Scot Dock in 2 months if the study is negative.    The patient was examined by Dr. Scot Dock as well today.  Roxy Horseman PA-C [facility] [place] Office: [phone]  MD in clinic [place]

## 2020-12-16 ENCOUNTER — Telehealth: Payer: Self-pay | Admitting: Internal Medicine

## 2020-12-16 ENCOUNTER — Telehealth: Payer: Self-pay | Admitting: Emergency Medicine

## 2020-12-16 MED ORDER — LEVOFLOXACIN 500 MG PO TABS
500.0000 mg | ORAL_TABLET | Freq: Every day | ORAL | 0 refills | Status: DC
Start: 2020-12-16 — End: 2020-12-30

## 2020-12-16 NOTE — Telephone Encounter (Signed)
Yes, that could be causing his pain

## 2020-12-16 NOTE — Telephone Encounter (Signed)
OK to give levaquin 500mg  qd x 7 days

## 2020-12-16 NOTE — Telephone Encounter (Signed)
   Patient wants to know if a "busted cyst/boil" on the back of his leg could have been the reason for leg pain Patient states about 1 month ago he was sitting on a barstool and a "boil" busted.on back of leg.  Patient states it was a lot of drainage from area, still some pain

## 2020-12-16 NOTE — Telephone Encounter (Signed)
Spoke with pt, aware of recs.  rx sent to preferred pharmacy.  Nothing further needed at this time- will close encounter.   
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
## 2020-12-16 NOTE — Telephone Encounter (Signed)
Called pt, LVM.   

## 2020-12-17 ENCOUNTER — Encounter (HOSPITAL_COMMUNITY): Payer: Self-pay | Admitting: Emergency Medicine

## 2020-12-17 ENCOUNTER — Emergency Department (HOSPITAL_COMMUNITY): Payer: 59

## 2020-12-17 ENCOUNTER — Telehealth: Payer: Self-pay

## 2020-12-17 ENCOUNTER — Ambulatory Visit: Payer: 59 | Admitting: Neurology

## 2020-12-17 ENCOUNTER — Inpatient Hospital Stay (HOSPITAL_COMMUNITY)
Admission: EM | Admit: 2020-12-17 | Discharge: 2020-12-30 | DRG: 291 | Disposition: A | Discharge status: Home / Self Care | Payer: 59 | Attending: Internal Medicine | Admitting: Internal Medicine

## 2020-12-17 ENCOUNTER — Encounter: Payer: Self-pay | Admitting: Neurology

## 2020-12-17 ENCOUNTER — Other Ambulatory Visit: Payer: Self-pay

## 2020-12-17 DIAGNOSIS — G4733 Obstructive sleep apnea (adult) (pediatric): Secondary | ICD-10-CM | POA: Diagnosis not present

## 2020-12-17 DIAGNOSIS — K219 Gastro-esophageal reflux disease without esophagitis: Secondary | ICD-10-CM | POA: Diagnosis present

## 2020-12-17 DIAGNOSIS — M109 Gout, unspecified: Secondary | ICD-10-CM | POA: Diagnosis present

## 2020-12-17 DIAGNOSIS — J9811 Atelectasis: Secondary | ICD-10-CM | POA: Diagnosis not present

## 2020-12-17 DIAGNOSIS — I2722 Pulmonary hypertension due to left heart disease: Secondary | ICD-10-CM | POA: Diagnosis present

## 2020-12-17 DIAGNOSIS — G9341 Metabolic encephalopathy: Secondary | ICD-10-CM | POA: Diagnosis not present

## 2020-12-17 DIAGNOSIS — J441 Chronic obstructive pulmonary disease with (acute) exacerbation: Secondary | ICD-10-CM | POA: Diagnosis present

## 2020-12-17 DIAGNOSIS — J449 Chronic obstructive pulmonary disease, unspecified: Secondary | ICD-10-CM | POA: Diagnosis present

## 2020-12-17 DIAGNOSIS — F17210 Nicotine dependence, cigarettes, uncomplicated: Secondary | ICD-10-CM | POA: Diagnosis present

## 2020-12-17 DIAGNOSIS — E118 Type 2 diabetes mellitus with unspecified complications: Secondary | ICD-10-CM | POA: Diagnosis not present

## 2020-12-17 DIAGNOSIS — E8770 Fluid overload, unspecified: Secondary | ICD-10-CM

## 2020-12-17 DIAGNOSIS — Z7951 Long term (current) use of inhaled steroids: Secondary | ICD-10-CM

## 2020-12-17 DIAGNOSIS — Z7984 Long term (current) use of oral hypoglycemic drugs: Secondary | ICD-10-CM

## 2020-12-17 DIAGNOSIS — I110 Hypertensive heart disease with heart failure: Principal | ICD-10-CM | POA: Diagnosis present

## 2020-12-17 DIAGNOSIS — I5042 Chronic combined systolic (congestive) and diastolic (congestive) heart failure: Secondary | ICD-10-CM | POA: Diagnosis present

## 2020-12-17 DIAGNOSIS — I959 Hypotension, unspecified: Secondary | ICD-10-CM

## 2020-12-17 DIAGNOSIS — I428 Other cardiomyopathies: Secondary | ICD-10-CM | POA: Diagnosis present

## 2020-12-17 DIAGNOSIS — R600 Localized edema: Secondary | ICD-10-CM | POA: Diagnosis not present

## 2020-12-17 DIAGNOSIS — Z880 Allergy status to penicillin: Secondary | ICD-10-CM

## 2020-12-17 DIAGNOSIS — E119 Type 2 diabetes mellitus without complications: Secondary | ICD-10-CM | POA: Diagnosis present

## 2020-12-17 DIAGNOSIS — E861 Hypovolemia: Secondary | ICD-10-CM | POA: Diagnosis not present

## 2020-12-17 DIAGNOSIS — D696 Thrombocytopenia, unspecified: Secondary | ICD-10-CM | POA: Diagnosis present

## 2020-12-17 DIAGNOSIS — Z9119 Patient's noncompliance with other medical treatment and regimen: Secondary | ICD-10-CM | POA: Diagnosis not present

## 2020-12-17 DIAGNOSIS — Z794 Long term (current) use of insulin: Secondary | ICD-10-CM

## 2020-12-17 DIAGNOSIS — I10 Essential (primary) hypertension: Secondary | ICD-10-CM | POA: Diagnosis not present

## 2020-12-17 DIAGNOSIS — J9602 Acute respiratory failure with hypercapnia: Secondary | ICD-10-CM | POA: Diagnosis not present

## 2020-12-17 DIAGNOSIS — I4510 Unspecified right bundle-branch block: Secondary | ICD-10-CM | POA: Diagnosis present

## 2020-12-17 DIAGNOSIS — J9621 Acute and chronic respiratory failure with hypoxia: Secondary | ICD-10-CM | POA: Diagnosis present

## 2020-12-17 DIAGNOSIS — Z79899 Other long term (current) drug therapy: Secondary | ICD-10-CM

## 2020-12-17 DIAGNOSIS — Z720 Tobacco use: Secondary | ICD-10-CM | POA: Diagnosis not present

## 2020-12-17 DIAGNOSIS — Z20822 Contact with and (suspected) exposure to covid-19: Secondary | ICD-10-CM | POA: Diagnosis present

## 2020-12-17 DIAGNOSIS — Z808 Family history of malignant neoplasm of other organs or systems: Secondary | ICD-10-CM

## 2020-12-17 DIAGNOSIS — I440 Atrioventricular block, first degree: Secondary | ICD-10-CM | POA: Diagnosis present

## 2020-12-17 DIAGNOSIS — F1010 Alcohol abuse, uncomplicated: Secondary | ICD-10-CM | POA: Diagnosis present

## 2020-12-17 DIAGNOSIS — F419 Anxiety disorder, unspecified: Secondary | ICD-10-CM | POA: Diagnosis present

## 2020-12-17 DIAGNOSIS — I5043 Acute on chronic combined systolic (congestive) and diastolic (congestive) heart failure: Secondary | ICD-10-CM | POA: Diagnosis present

## 2020-12-17 DIAGNOSIS — I5021 Acute systolic (congestive) heart failure: Secondary | ICD-10-CM

## 2020-12-17 DIAGNOSIS — J9601 Acute respiratory failure with hypoxia: Secondary | ICD-10-CM

## 2020-12-17 DIAGNOSIS — I509 Heart failure, unspecified: Secondary | ICD-10-CM | POA: Diagnosis present

## 2020-12-17 DIAGNOSIS — Z888 Allergy status to other drugs, medicaments and biological substances status: Secondary | ICD-10-CM

## 2020-12-17 DIAGNOSIS — I872 Venous insufficiency (chronic) (peripheral): Secondary | ICD-10-CM | POA: Diagnosis present

## 2020-12-17 DIAGNOSIS — Z801 Family history of malignant neoplasm of trachea, bronchus and lung: Secondary | ICD-10-CM

## 2020-12-17 DIAGNOSIS — I9589 Other hypotension: Secondary | ICD-10-CM | POA: Diagnosis not present

## 2020-12-17 DIAGNOSIS — I5041 Acute combined systolic (congestive) and diastolic (congestive) heart failure: Secondary | ICD-10-CM | POA: Diagnosis not present

## 2020-12-17 DIAGNOSIS — E785 Hyperlipidemia, unspecified: Secondary | ICD-10-CM | POA: Diagnosis present

## 2020-12-17 DIAGNOSIS — Z6837 Body mass index (BMI) 37.0-37.9, adult: Secondary | ICD-10-CM

## 2020-12-17 DIAGNOSIS — J9622 Acute and chronic respiratory failure with hypercapnia: Secondary | ICD-10-CM | POA: Diagnosis present

## 2020-12-17 DIAGNOSIS — Z881 Allergy status to other antibiotic agents status: Secondary | ICD-10-CM

## 2020-12-17 HISTORY — DX: Heart failure, unspecified: I50.9

## 2020-12-17 LAB — HEPATIC FUNCTION PANEL
ALT: 24 U/L (ref 0–44)
AST: 30 U/L (ref 15–41)
Albumin: 3.8 g/dL (ref 3.5–5.0)
Alkaline Phosphatase: 73 U/L (ref 38–126)
Bilirubin, Direct: 0.4 mg/dL — ABNORMAL HIGH (ref 0.0–0.2)
Indirect Bilirubin: 1.1 mg/dL — ABNORMAL HIGH (ref 0.3–0.9)
Total Bilirubin: 1.5 mg/dL — ABNORMAL HIGH (ref 0.3–1.2)
Total Protein: 7 g/dL (ref 6.5–8.1)

## 2020-12-17 LAB — CBC
HCT: 52.1 % — ABNORMAL HIGH (ref 39.0–52.0)
Hemoglobin: 17.1 g/dL — ABNORMAL HIGH (ref 13.0–17.0)
MCH: 33.1 pg (ref 26.0–34.0)
MCHC: 32.8 g/dL (ref 30.0–36.0)
MCV: 100.8 fL — ABNORMAL HIGH (ref 80.0–100.0)
Platelets: 135 10*3/uL — ABNORMAL LOW (ref 150–400)
RBC: 5.17 MIL/uL (ref 4.22–5.81)
RDW: 13 % (ref 11.5–15.5)
WBC: 9.4 10*3/uL (ref 4.0–10.5)
nRBC: 0 % (ref 0.0–0.2)

## 2020-12-17 LAB — BASIC METABOLIC PANEL
Anion gap: 11 (ref 5–15)
BUN: 12 mg/dL (ref 8–23)
CO2: 30 mmol/L (ref 22–32)
Calcium: 8.9 mg/dL (ref 8.9–10.3)
Chloride: 99 mmol/L (ref 98–111)
Creatinine, Ser: 0.84 mg/dL (ref 0.61–1.24)
GFR, Estimated: >60 mL/min (ref >60)
Glucose, Bld: 116 mg/dL — ABNORMAL HIGH (ref 70–99)
Potassium: 4 mmol/L (ref 3.5–5.1)
Sodium: 140 mmol/L (ref 135–145)

## 2020-12-17 LAB — MAGNESIUM: Magnesium: 1.8 mg/dL (ref 1.7–2.4)

## 2020-12-17 LAB — TROPONIN I (HIGH SENSITIVITY)
Troponin I (High Sensitivity): 26 ng/L — ABNORMAL HIGH (ref <18)
Troponin I (High Sensitivity): 27 ng/L — ABNORMAL HIGH (ref <18)

## 2020-12-17 LAB — BRAIN NATRIURETIC PEPTIDE: B Natriuretic Peptide: 1374.9 pg/mL — ABNORMAL HIGH (ref 0.0–100.0)

## 2020-12-17 LAB — RESP PANEL BY RT-PCR (FLU A&B, COVID) ARPGX2
Influenza A by PCR: NEGATIVE
Influenza B by PCR: NEGATIVE
SARS Coronavirus 2 by RT PCR: NEGATIVE

## 2020-12-17 LAB — GLUCOSE, CAPILLARY
Glucose-Capillary: 111 mg/dL — ABNORMAL HIGH (ref 70–99)
Glucose-Capillary: 129 mg/dL — ABNORMAL HIGH (ref 70–99)

## 2020-12-17 MED ORDER — FUROSEMIDE 10 MG/ML IJ SOLN
60.0000 mg | Freq: Two times a day (BID) | INTRAMUSCULAR | Status: DC
  Administered 2020-12-17 – 2020-12-18 (×2): 60 mg via INTRAVENOUS
  Filled 2020-12-17 (×2): qty 6

## 2020-12-17 MED ORDER — EZETIMIBE 10 MG PO TABS
10.0000 mg | ORAL_TABLET | Freq: Every day | ORAL | Status: DC
  Administered 2020-12-18 – 2020-12-30 (×11): 10 mg via ORAL
  Filled 2020-12-17 (×13): qty 1

## 2020-12-17 MED ORDER — TRAMADOL HCL 50 MG PO TABS
50.0000 mg | ORAL_TABLET | Freq: Four times a day (QID) | ORAL | Status: DC | PRN
  Administered 2020-12-17 – 2020-12-29 (×8): 50 mg via ORAL
  Filled 2020-12-17 (×9): qty 1

## 2020-12-17 MED ORDER — TIOTROPIUM BROMIDE MONOHYDRATE 2.5 MCG/ACT IN AERS: 2.5000 ug | INHALATION_SPRAY | Freq: Two times a day (BID) | RESPIRATORY_TRACT | Status: DC

## 2020-12-17 MED ORDER — ONDANSETRON HCL 4 MG/2ML IJ SOLN: 4.0000 mg | Freq: Four times a day (QID) | INTRAMUSCULAR | Status: DC | PRN

## 2020-12-17 MED ORDER — ALLOPURINOL 300 MG PO TABS
300.0000 mg | ORAL_TABLET | Freq: Every day | ORAL | Status: DC
  Administered 2020-12-17 – 2020-12-30 (×12): 300 mg via ORAL
  Filled 2020-12-17 (×2): qty 1
  Filled 2020-12-17: qty 3
  Filled 2020-12-17 (×6): qty 1
  Filled 2020-12-17: qty 3
  Filled 2020-12-17 (×3): qty 1

## 2020-12-17 MED ORDER — PREDNISONE 5 MG PO TABS
10.0000 mg | ORAL_TABLET | Freq: Every day | ORAL | Status: DC
  Administered 2020-12-18 – 2020-12-19 (×2): 10 mg via ORAL
  Filled 2020-12-17 (×2): qty 2

## 2020-12-17 MED ORDER — FUROSEMIDE 10 MG/ML IJ SOLN
60.0000 mg | Freq: Once | INTRAMUSCULAR | Status: AC
  Administered 2020-12-17: 60 mg via INTRAVENOUS
  Filled 2020-12-17: qty 8

## 2020-12-17 MED ORDER — MOMETASONE FURO-FORMOTEROL FUM 200-5 MCG/ACT IN AERO
2.0000 | INHALATION_SPRAY | Freq: Two times a day (BID) | RESPIRATORY_TRACT | Status: DC
  Administered 2020-12-17 – 2020-12-30 (×23): 2 via RESPIRATORY_TRACT
  Filled 2020-12-17: qty 8.8

## 2020-12-17 MED ORDER — LOSARTAN POTASSIUM 50 MG PO TABS
100.0000 mg | ORAL_TABLET | Freq: Every day | ORAL | Status: DC
  Administered 2020-12-17 – 2020-12-27 (×9): 100 mg via ORAL
  Filled 2020-12-17 (×10): qty 2

## 2020-12-17 MED ORDER — ROFLUMILAST 500 MCG PO TABS
500.0000 ug | ORAL_TABLET | Freq: Every day | ORAL | Status: DC
  Administered 2020-12-17 – 2020-12-30 (×12): 500 ug via ORAL
  Filled 2020-12-17 (×14): qty 1

## 2020-12-17 MED ORDER — NABUMETONE 500 MG PO TABS
500.0000 mg | ORAL_TABLET | Freq: Two times a day (BID) | ORAL | Status: DC | PRN
  Administered 2020-12-24 – 2020-12-25 (×2): 500 mg via ORAL
  Filled 2020-12-17 (×4): qty 1

## 2020-12-17 MED ORDER — ALBUTEROL SULFATE HFA 108 (90 BASE) MCG/ACT IN AERS
4.0000 | INHALATION_SPRAY | Freq: Once | RESPIRATORY_TRACT | Status: AC
  Administered 2020-12-17: 4 via RESPIRATORY_TRACT
  Filled 2020-12-17: qty 6.7

## 2020-12-17 MED ORDER — ALPRAZOLAM 1 MG PO TABS
1.0000 mg | ORAL_TABLET | Freq: Every evening | ORAL | Status: DC | PRN
  Administered 2020-12-18 – 2020-12-20 (×3): 1 mg via ORAL
  Filled 2020-12-17 (×3): qty 1

## 2020-12-17 MED ORDER — MONTELUKAST SODIUM 10 MG PO TABS
10.0000 mg | ORAL_TABLET | Freq: Every day | ORAL | Status: DC
  Administered 2020-12-17 – 2020-12-29 (×13): 10 mg via ORAL
  Filled 2020-12-17 (×13): qty 1

## 2020-12-17 MED ORDER — ACETAMINOPHEN 325 MG PO TABS
650.0000 mg | ORAL_TABLET | Freq: Four times a day (QID) | ORAL | Status: DC | PRN
  Administered 2020-12-17 – 2020-12-29 (×18): 650 mg via ORAL
  Filled 2020-12-17 (×19): qty 2

## 2020-12-17 MED ORDER — ONDANSETRON HCL 4 MG PO TABS: 4.0000 mg | ORAL_TABLET | Freq: Four times a day (QID) | ORAL | Status: DC | PRN

## 2020-12-17 MED ORDER — INSULIN ASPART 100 UNIT/ML ~~LOC~~ SOLN
0.0000 [IU] | Freq: Three times a day (TID) | SUBCUTANEOUS | Status: DC
  Administered 2020-12-18: 1 [IU] via SUBCUTANEOUS
  Administered 2020-12-19 – 2020-12-20 (×2): 2 [IU] via SUBCUTANEOUS
  Administered 2020-12-21: 1 [IU] via SUBCUTANEOUS
  Administered 2020-12-21: 2 [IU] via SUBCUTANEOUS
  Administered 2020-12-21: 3 [IU] via SUBCUTANEOUS
  Administered 2020-12-22: 2 [IU] via SUBCUTANEOUS
  Administered 2020-12-22: 5 [IU] via SUBCUTANEOUS
  Administered 2020-12-22 – 2020-12-23 (×2): 2 [IU] via SUBCUTANEOUS
  Administered 2020-12-23: 5 [IU] via SUBCUTANEOUS
  Administered 2020-12-24: 1 [IU] via SUBCUTANEOUS
  Administered 2020-12-24: 2 [IU] via SUBCUTANEOUS
  Administered 2020-12-25: 1 [IU] via SUBCUTANEOUS
  Administered 2020-12-25 – 2020-12-26 (×2): 2 [IU] via SUBCUTANEOUS
  Administered 2020-12-26 – 2020-12-27 (×2): 1 [IU] via SUBCUTANEOUS
  Administered 2020-12-28: 3 [IU] via SUBCUTANEOUS
  Administered 2020-12-29: 1 [IU] via SUBCUTANEOUS
  Administered 2020-12-29: 3 [IU] via SUBCUTANEOUS
  Administered 2020-12-30: 2 [IU] via SUBCUTANEOUS

## 2020-12-17 MED ORDER — ALBUTEROL SULFATE (2.5 MG/3ML) 0.083% IN NEBU
2.5000 mg | INHALATION_SOLUTION | Freq: Four times a day (QID) | RESPIRATORY_TRACT | Status: DC | PRN
  Administered 2020-12-17 – 2020-12-18 (×2): 2.5 mg via RESPIRATORY_TRACT
  Filled 2020-12-17: qty 3

## 2020-12-17 MED ORDER — HYDRALAZINE HCL 25 MG PO TABS: 25.0000 mg | ORAL_TABLET | Freq: Three times a day (TID) | ORAL | Status: DC | PRN

## 2020-12-17 MED ORDER — ENOXAPARIN SODIUM 60 MG/0.6ML ~~LOC~~ SOLN
60.0000 mg | SUBCUTANEOUS | Status: DC
  Administered 2020-12-17 – 2020-12-22 (×6): 60 mg via SUBCUTANEOUS
  Filled 2020-12-17 (×6): qty 0.6

## 2020-12-17 MED ORDER — ACETAMINOPHEN 650 MG RE SUPP: 650.0000 mg | Freq: Four times a day (QID) | RECTAL | Status: DC | PRN

## 2020-12-17 MED ORDER — ALBUTEROL SULFATE HFA 108 (90 BASE) MCG/ACT IN AERS
2.0000 | INHALATION_SPRAY | Freq: Four times a day (QID) | RESPIRATORY_TRACT | Status: DC | PRN
  Filled 2020-12-17: qty 6.7

## 2020-12-17 MED ORDER — UMECLIDINIUM BROMIDE 62.5 MCG/INH IN AEPB
1.0000 | INHALATION_SPRAY | Freq: Every day | RESPIRATORY_TRACT | Status: DC
  Administered 2020-12-18 – 2020-12-30 (×11): 1 via RESPIRATORY_TRACT
  Filled 2020-12-17 (×2): qty 7

## 2020-12-17 NOTE — ED Triage Notes (Signed)
Per pt, since falling 2 months ago, patient has had increased swelling from waist down to lower extremities-states he has gone up 4 sizes in pants-states he has a history of CHF and is on a diuretic-his cardiologist don't know how to treat his symptoms-states increased chest tightness and congestion, SOB

## 2020-12-17 NOTE — ED Notes (Signed)
Phone report given to [facility].

## 2020-12-17 NOTE — ED Provider Notes (Signed)
[facility] DEPT Provider Note   CSN: [id number] Arrival date & time: 12/17/20  1038     History Chief Complaint  Patient presents with  . Chest Pain  . Leg Swelling    Robert Church is a 64 y.o. male.  Presents to ER with concern for shortness of breath and leg swelling.  Patient reports for the past couple months he has had steadily worsening leg swelling, swelling of his lower abdomen.  States that over the last couple weeks he has had steadily worsening shortness of breath.  Also has a little bit of chest tightness.  Breathing gets worse with his exertion.  Has been compliant with torsemide. HPI     Past Medical History:  Diagnosis Date  . Allergy   . Asthma   . Bronchitis   . CHF (congestive heart failure) ([place])   . COPD (chronic obstructive pulmonary disease) ([place])   . GERD (gastroesophageal reflux disease)   . Hypertension   . Pneumonia   . Sinusitis   . SOB (shortness of breath)     Patient Active Problem List   Diagnosis Date Noted  . Acute CHF (congestive heart failure) ([place]) 12/17/2020  . Type II diabetes mellitus with manifestations ([place]) 11/29/2020  . Ataxia 11/29/2020  . Thrombocytopenia ([place]) 11/29/2020  . LAD (lymphadenopathy), inguinal 11/28/2020  . Edema of lower extremity due to peripheral venous insufficiency 10/29/2020  . Primary osteoarthritis of both knees 09/24/2020  . Gynecomastia, male 08/08/2020  . Idiopathic chronic gout of foot without tophus 08/08/2020  . Yellow jacket sting allergy 08/08/2020  . Prediabetes 08/08/2020  . Class 2 severe obesity due to excess calories with serious comorbidity and body mass index (BMI) of 38.0 to 38.9 in adult ([place]) 08/05/2020  . Hyperlipidemia LDL goal <70 07/11/2020  . Hypogonadism male 07/08/2020  . Hypertriglyceridemia 07/08/2020  . Routine general medical examination at a health care facility 07/08/2020  . Umbilical hernia without obstruction and without gangrene  07/08/2020  . Chronic combined systolic and diastolic CHF (congestive heart failure) ([place]) 01/09/2020  . Solitary pulmonary nodule 08/15/2019  . 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 NICM (nonischemic cardiomyopathy) ([place]) 12/16/2018  . History of adenomatous polyp of colon 11/25/2018  . Dyslipidemia 12/15/2017  . Bronchitis 02/01/2017  . Morbid obesity ([place]) 11/24/2015  . Cigarette smoker 01/16/2015  . COPD, group D, by GOLD 2017 classification ([place]) 03/03/2013  . Obstructive sleep apnea syndrome 01/05/2013  . Asbestos exposure 01/05/2013  . Seasonal allergic rhinitis 10/06/2012  . GERD (gastroesophageal reflux disease) 06/05/2012  . Insomnia 06/05/2012  . 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 HTN (hypertension) 07/23/2011    Past Surgical History:  Procedure Laterality Date  . LEFT HEART CATH AND CORONARY ANGIOGRAPHY N/A 04/19/2017   Procedure: Left Heart Cath and Coronary Angiography;  Surgeon: Belva Crome, MD;  Location: [facility];  Service: Cardiovascular;  Laterality: N/A;  . TONSILLECTOMY         Family History  Problem Relation Age of Onset  . Lung cancer Father   . Brain cancer Father     Social History   Tobacco Use  . Smoking status: Current Every Day Smoker    Packs/day: 0.25    Years: 46.00    Pack years: 11.50    Types: Cigarettes  . Smokeless tobacco: Never Used  . Tobacco comment: 3 cigarettes smoked daily  ARJ 12/05/20  Vaping Use  . Vaping Use: Never used  Substance Use Topics  .  Alcohol use: Yes    Alcohol/week: 0.0 standard drinks  . Drug use: No    Home Medications Prior to Admission medications   Medication Sig Start Date End Date Taking? Authorizing Provider  albuterol (PROVENTIL) (2.5 MG/3ML) 0.083% nebulizer solution Take 3 mLs (2.5 mg total) by nebulization every 6 (six) hours as needed for wheezing or shortness of breath. 10/01/20  Yes Collene Gobble, MD  albuterol (VENTOLIN HFA) 108 (90 Base) MCG/ACT inhaler Inhale 2 puffs into the  lungs every 6 (six) hours as needed for wheezing. 12/11/20  Yes Collene Gobble, MD  allopurinol (ZYLOPRIM) 300 MG tablet Take 1 tablet (300 mg total) by mouth daily. 11/29/20  Yes Janith Lima, MD  ALPRAZolam Duanne Moron) 1 MG tablet Take 1 mg by mouth at bedtime as needed for anxiety.   Yes [provider]  Aspirin-Salicylamide-Caffeine (BC HEADACHE POWDER PO) Take 1 each by mouth daily as needed (headache).   Yes [provider]  Azelastine-Fluticasone 137-50 MCG/ACT SUSP 1 spray each nostril twice daily Patient taking differently: Place 1 spray into both nostrils daily as needed (allergy). 08/31/14  Yes Collene Gobble, MD  cholecalciferol (VITAMIN D) 1000 units tablet Take 1,000 Units by mouth daily.   Yes [provider]  ezetimibe (ZETIA) 10 MG tablet Take 1 tablet (10 mg total) by mouth daily. 07/23/20  Yes Janith Lima, MD  levofloxacin (LEVAQUIN) 500 MG tablet Take 1 tablet (500 mg total) by mouth daily. Patient taking differently: Take 500 mg by mouth daily. Start date : 12/16/20 12/16/20  Yes Collene Gobble, MD  losartan (COZAAR) 100 MG tablet Take 1 tablet (100 mg total) by mouth daily. 09/18/20 03/17/21 Yes [name], MD  metFORMIN (GLUCOPHAGE XR) 750 MG 24 hr tablet Take 1 tablet (750 mg total) by mouth daily with breakfast. 12/09/20  Yes Janith Lima, MD  mometasone-formoterol [facility]) 200-5 MCG/ACT AERO Take 2 puffs first thing in am and then another 2 puffs about 12 hours later. Patient taking differently: Inhale 2 puffs into the lungs 2 (two) times daily. 12/11/20  Yes Collene Gobble, MD  montelukast (SINGULAIR) 10 MG tablet Take 1 tablet (10 mg total) by mouth at bedtime. 09/12/20  Yes Janith Lima, MD  omeprazole (PRILOSEC) 20 MG capsule Take 1 capsule (20 mg total) by mouth 2 (two) times daily before a meal. Patient taking differently: Take 20 mg by mouth daily. 07/28/20  Yes Janith Lima, MD  predniSONE (DELTASONE) 10 MG tablet Take 1  tablet (10 mg total) by mouth daily with breakfast. 12/11/20  Yes Byrum, Rose Fillers, MD  roflumilast (DALIRESP) 500 MCG TABS tablet Take 1 tablet (500 mcg total) by mouth daily. 06/12/20  Yes Collene Gobble, MD  Tiotropium Bromide Monohydrate (SPIRIVA RESPIMAT) 2.5 MCG/ACT AERS Inhale 2.5 mcg into the lungs 2 (two) times daily. 12/11/20  Yes Collene Gobble, MD  torsemide (DEMADEX) 20 MG tablet Take 20 mg by mouth daily.   Yes [provider]  traMADol (ULTRAM) 50 MG tablet Take 50 mg by mouth every 6 (six) hours as needed for severe pain.   Yes [provider]  Budeson-Glycopyrrol-Formoterol (BREZTRI AEROSPHERE) 160-9-4.8 MCG/ACT AERO Inhale 2 puffs into the lungs in the morning and at bedtime. Patient not taking: No sig reported 12/05/20   Collene Gobble, MD  ciprofloxacin (CIPRO) 500 MG tablet Take 1 tablet (500 mg total) by mouth 2 (two) times daily. Patient taking differently: Take 500 mg by  mouth 2 (two) times daily. Start date 12/12/20 12/12/20   Lars Mage, PA-C  Colchicine (MITIGARE) 0.6 MG CAPS Take 1 tablet by mouth 2 (two) times daily. Patient not taking: No sig reported 08/08/20   [name], MD  EPINEPHrine 0.3 mg/0.3 mL IJ SOAJ injection Inject 0.3 mLs (0.3 mg total) into the muscle as needed for anaphylaxis. Patient not taking: No sig reported 08/08/20   [name], MD  hydrALAZINE (APRESOLINE) 10 MG tablet Take 1 tablet (10 mg total) by mouth in the morning and at bedtime. Patient not taking: No sig reported 09/19/20   Azalee Course, PA  Insulin Pen Needle (NOVOFINE) 32G X 6 MM MISC 1 Act by Does not apply route once a week. 08/05/20   [name], MD  nabumetone (RELAFEN) 500 MG tablet Take 1 tablet (500 mg total) by mouth 2 (two) times daily as needed. Patient taking differently: Take 500 mg by mouth 2 (two) times daily as needed for moderate pain. 12/06/20   [name], MD  torsemide (DEMADEX) 20 MG tablet Take 2 tablets (40 mg total) by mouth  daily. Patient not taking: No sig reported 01/11/20   Bensimhon, Bevelyn Buckles, MD    Allergies    Clindamycin, Doxycycline, E-mycin [erythromycin base], Penicillins, and Rosuvastatin  Review of Systems   Review of Systems  Constitutional: Negative for chills and fever.  HENT: Negative for ear pain and sore throat.   Eyes: Negative for pain and visual disturbance.  Respiratory: Positive for chest tightness and shortness of breath. Negative for cough.   Cardiovascular: Positive for chest pain and leg swelling. Negative for palpitations.  Gastrointestinal: Negative for abdominal pain and vomiting.  Genitourinary: Negative for dysuria and hematuria.  Musculoskeletal: Negative for arthralgias and back pain.  Skin: Negative for color change and rash.  Neurological: Negative for seizures and syncope.  All other systems reviewed and are negative.   Physical Exam Updated Vital Signs BP (!) 176/99 (BP Location: Left Arm)   Pulse 75   Temp 98.4 F (36.9 C) (Oral)   Resp (!) 22   Ht 5\' 10"  (1.778 m)   Wt 118.9 kg   SpO2 93%   BMI 37.62 kg/m   Physical Exam Vitals and nursing note reviewed.  Constitutional:      Appearance: He is well-developed and well-nourished.  HENT:     Head: Normocephalic and atraumatic.  Eyes:     Conjunctiva/sclera: Conjunctivae normal.  Cardiovascular:     Rate and Rhythm: Normal rate and regular rhythm.     Heart sounds: No murmur heard.   Pulmonary:     Breath sounds: Normal breath sounds.     Comments: Mild tachypnea, wheezing and slight crackles noted Abdominal:     Palpations: Abdomen is soft.     Tenderness: There is no abdominal tenderness.     Comments: Pitting edema to lower abdomen, no tenderness  Musculoskeletal:        General: No edema.     Cervical back: Neck supple.     Comments: Bilateral pitting edema throughout both legs to level of abdomen  Skin:    General: Skin is warm and dry.  Neurological:     Mental Status: He is alert.   Psychiatric:        Mood and Affect: Mood and affect normal.     ED Results / Procedures / Treatments   Labs (all labs ordered are listed, but only abnormal results are displayed) Labs  Reviewed  BASIC METABOLIC PANEL - Abnormal; Notable for the following components:      Result Value   Glucose, Bld 116 (*)    All other components within normal limits  CBC - Abnormal; Notable for the following components:   Hemoglobin 17.1 (*)    HCT 52.1 (*)    MCV 100.8 (*)    Platelets 135 (*)    All other components within normal limits  BRAIN NATRIURETIC PEPTIDE - Abnormal; Notable for the following components:   B Natriuretic Peptide 1,374.9 (*)    All other components within normal limits  TROPONIN I (HIGH SENSITIVITY) - Abnormal; Notable for the following components:   Troponin I (High Sensitivity) 26 (*)    All other components within normal limits  TROPONIN I (HIGH SENSITIVITY) - Abnormal; Notable for the following components:   Troponin I (High Sensitivity) 27 (*)    All other components within normal limits  RESP PANEL BY RT-PCR (FLU A&B, COVID) ARPGX2  MAGNESIUM  HEPATIC FUNCTION PANEL  COMPREHENSIVE METABOLIC PANEL  CBC    EKG EKG Interpretation  Date/Time:  Tuesday December 17 2020 10:47:02 EST Ventricular Rate:  100 PR Interval:    QRS Duration: 140 QT Interval:  431 QTC Calculation: 556 R Axis:   -172 Text Interpretation: Sinus tachycardia Borderline prolonged PR interval Probable left atrial enlargement RBBB and LPFB Anterolateral infarct, age indeterminate 75 Lead; [place] No significant change since last tracing Confirmed by Madalyn Rob [phone]) on 12/17/2020 11:44:03 AM   Radiology DG Chest 2 View  Result Date: 12/17/2020 CLINICAL DATA:  Shortness of breath EXAM: CHEST - 2 VIEW COMPARISON:  June 27, 2018 FINDINGS: There is mild left midlung atelectasis. No edema or airspace opacity. There is mild cardiomegaly with a degree of pulmonary venous  hypertension. No evident adenopathy. No bone lesions. IMPRESSION: Mild cardiomegaly with pulmonary vascular congestion. No edema or consolidation. Mild left midlung atelectatic change. Electronically Signed   By: Lowella Grip III M.D.   On: 12/17/2020 11:22    Procedures .Critical Care Performed by: Lucrezia Starch, MD Authorized by: Lucrezia Starch, MD   Critical care provider statement:    Critical care time (minutes):  45   Critical care was necessary to treat or prevent imminent or life-threatening deterioration of the following conditions:  Respiratory failure   Critical care was time spent personally by me on the following activities:  Discussions with consultants, evaluation of patient's response to treatment, examination of patient, ordering and performing treatments and interventions, ordering and review of laboratory studies, ordering and review of radiographic studies, pulse oximetry, re-evaluation of patient's condition, obtaining history from patient or surrogate and review of old charts   (including critical care time)  Medications Ordered in ED Medications  allopurinol (ZYLOPRIM) tablet 300 mg (has no administration in time range)  nabumetone (RELAFEN) tablet 500 mg (has no administration in time range)  traMADol (ULTRAM) tablet 50 mg (has no administration in time range)  ezetimibe (ZETIA) tablet 10 mg (has no administration in time range)  losartan (COZAAR) tablet 100 mg (has no administration in time range)  ALPRAZolam (XANAX) tablet 1 mg (has no administration in time range)  predniSONE (DELTASONE) tablet 10 mg (has no administration in time range)  albuterol (VENTOLIN HFA) 108 (90 Base) MCG/ACT inhaler 2 puff (has no administration in time range)  mometasone-formoterol (DULERA) 200-5 MCG/ACT inhaler 2 puff (has no administration in time range)  montelukast (SINGULAIR) tablet 10 mg (has no administration in time range)  roflumilast (DALIRESP) tablet 500 mcg  (has no administration in time range)  insulin aspart (novoLOG) injection 0-9 Units (has no administration in time range)  furosemide (LASIX) injection 60 mg (has no administration in time range)  enoxaparin (LOVENOX) injection 60 mg (has no administration in time range)  acetaminophen (TYLENOL) tablet 650 mg (has no administration in time range)    Or  acetaminophen (TYLENOL) suppository 650 mg (has no administration in time range)  ondansetron (ZOFRAN) tablet 4 mg (has no administration in time range)    Or  ondansetron (ZOFRAN) injection 4 mg (has no administration in time range)  hydrALAZINE (APRESOLINE) tablet 25 mg (has no administration in time range)  umeclidinium bromide (INCRUSE ELLIPTA) 62.5 MCG/INH 1 puff (has no administration in time range)  albuterol (VENTOLIN HFA) 108 (90 Base) MCG/ACT inhaler 4 puff (4 puffs Inhalation Given 12/17/20 1135)  furosemide (LASIX) injection 60 mg (60 mg Intravenous Given 12/17/20 1310)    ED Course  I have reviewed the triage vital signs and the nursing notes.  Pertinent labs & imaging results that were available during my care of the patient were reviewed by me and considered in my medical decision making (see chart for details).    MDM Rules/Calculators/A&P                           64 year old male presents to ER with concern for shortness of breath and leg swelling.  On exam patient noted to be mildly hypoxic on room air, noted wheezing and mild crackles on lungs exam, on extremity exam he has profound pitting edema to lower legs extending to his lower abdomen.  CXR consistent with some vascular congestion.  BNP profoundly elevated.  Suspect patient's primary problem profound fluid overload state, heart failure.  Will admit to hospital service for IV diuretic therapy.  Final Clinical Impression(s) / ED Diagnoses Final diagnoses:  Acute respiratory failure with hypoxia (HCC)  Acute on chronic heart failure, unspecified heart failure type  (HCC)  Hypervolemia, unspecified hypervolemia type    Rx / DC Orders ED Discharge Orders    None       Lucrezia Starch, MD 12/17/20 1711

## 2020-12-17 NOTE — Telephone Encounter (Signed)
Patient called to request CT scan scheduled prior to end of the year due to meeting his deductible. Advised patient we were unable to schedule prior to end of year, but would call him when we made his appt. Patient verbalized understanding.

## 2020-12-17 NOTE — Progress Notes (Signed)
Patient has SOB intermittently at rest and worse with exertion. Education provided regarding Oxygen saturation and the importance of maintaining continuous oxygen at 3 lt  to sustain oxygen appropriate oxygen saturation. Pt has removed oxygen on three occurrences. O2 sat decreased 76%.  Will continue to encourage.

## 2020-12-17 NOTE — Plan of Care (Signed)
Patient education/rights provided. Pt is aware of call bell and will utilize when needing assistance out of the bed. Call button and personal belongings are within reach.

## 2020-12-17 NOTE — ED Notes (Signed)
Pt to xray via stretcher

## 2020-12-17 NOTE — Telephone Encounter (Signed)
Pt did not show for their appt with Dr. Athar today.  

## 2020-12-17 NOTE — H&P (Signed)
History and Physical    Robert Church DOB: 02/25/1956 DOA: 12/17/2020  PCP: Janith Lima, MD  Patient coming from: Home  Chief Complaint: swelling and dyspnea  HPI: Robert Church is a 64 y.o. male with medical history significant of CHF, COPD, HTN. Presenting with bilateral swelling in legs for the last 2 months. It has progressed to his stomach over that time. He reports taking his regular HF meds consistently. They have not helped. He saw his PCP who sent him to the vascular center. He has dopplers checked. No clots were seen. He was supposed to have a CTA of the chest/ab/pelvis performed, but they weren't able to get it scheduled before the year's end. His swelling has continued. Now his symptoms include a cough that started yesterday. He was started on levaquin and he has been taking his normal prednisone/inhalers to no avail. He decided it was time to come to the ED.    ED Course: He was found to have edema extending from feet to stomach. His BNP was 1300+. His trp were mildly elevated, but flat. CXR showed some pulmonary congestion. He was given lasix. TRH was called for admission.  Review of Systems: Denies F, CP, palpitations, N/V. Review of systems is otherwise negative for all not mentioned in HPI.   PMHx Past Medical History:  Diagnosis Date  . Allergy   . Asthma   . Bronchitis   . CHF (congestive heart failure) ([place])   . COPD (chronic obstructive pulmonary disease) ([place])   . GERD (gastroesophageal reflux disease)   . Hypertension   . Pneumonia   . Sinusitis   . SOB (shortness of breath)     PSHx Past Surgical History:  Procedure Laterality Date  . LEFT HEART CATH AND CORONARY ANGIOGRAPHY N/A 04/19/2017   Procedure: Left Heart Cath and Coronary Angiography;  Surgeon: Belva Crome, MD;  Location: [facility];  Service: Cardiovascular;  Laterality: N/A;  . TONSILLECTOMY      SocHx  reports that he has been smoking cigarettes. He has  a 11.50 pack-year smoking history. He has never used smokeless tobacco. He reports current alcohol use. He reports that he does not use drugs.  Allergies  Allergen Reactions  . Clindamycin Other (See Comments)    Tongue swelling  . Doxycycline     Severe stomach upset per patient  . E-Mycin [Erythromycin Base] Swelling  . Penicillins Swelling and Other (See Comments)    Childhood rxn--MD stated he "almost died" Has patient had a PCN reaction causing immediate rash, facial/tongue/throat swelling, SOB or lightheadedness with hypotension:Yes Has patient had a PCN reaction causing severe rash involving mucus membranes or skin necrosis:unsure Has patient had a PCN reaction that required hospitalization:unsure Has patient had a PCN reaction occurring within the last 10 years:No If all of the above answers are "NO", then may proceed with Cephalosporin use.    . Rosuvastatin     Other reaction(s): Myalgias (intolerance)    FamHx Family History  Problem Relation Age of Onset  . Lung cancer Father   . Brain cancer Father     Prior to Admission medications   Medication Sig Start Date End Date Taking? Authorizing Provider  albuterol (PROVENTIL) (2.5 MG/3ML) 0.083% nebulizer solution Take 3 mLs (2.5 mg total) by nebulization every 6 (six) hours as needed for wheezing or shortness of breath. 10/01/20   Collene Gobble, MD  albuterol (VENTOLIN HFA) 108 (90 Base) MCG/ACT inhaler Inhale 2 puffs into  the lungs every 6 (six) hours as needed for wheezing. 12/11/20   Collene Gobble, MD  allopurinol (ZYLOPRIM) 300 MG tablet Take 1 tablet (300 mg total) by mouth daily. 11/29/20   Janith Lima, MD  Azelastine-Fluticasone [phone] MCG/ACT SUSP 1 spray each nostril twice daily 08/31/14   Collene Gobble, MD  Budeson-Glycopyrrol-Formoterol (BREZTRI AEROSPHERE) 160-9-4.8 MCG/ACT AERO Inhale 2 puffs into the lungs in the morning and at bedtime. 12/05/20   Collene Gobble, MD  cholecalciferol (VITAMIN D) 1000  units tablet Take 1,000 Units by mouth daily.    [provider]  ciprofloxacin (CIPRO) 500 MG tablet Take 1 tablet (500 mg total) by mouth 2 (two) times daily. 12/12/20   Ulyses Amor, PA-C  Colchicine (MITIGARE) 0.6 MG CAPS Take 1 tablet by mouth 2 (two) times daily. 08/08/20   Janith Lima, MD  EPINEPHrine 0.3 mg/0.3 mL IJ SOAJ injection Inject 0.3 mLs (0.3 mg total) into the muscle as needed for anaphylaxis. 08/08/20   Janith Lima, MD  ezetimibe (ZETIA) 10 MG tablet Take 1 tablet (10 mg total) by mouth daily. 07/23/20   Janith Lima, MD  hydrALAZINE (APRESOLINE) 10 MG tablet Take 1 tablet (10 mg total) by mouth in the morning and at bedtime. 09/19/20   Almyra Deforest, PA  Insulin Pen Needle (NOVOFINE) 32G X 6 MM MISC 1 Act by Does not apply route once a week. 08/05/20   Janith Lima, MD  levofloxacin (LEVAQUIN) 500 MG tablet Take 1 tablet (500 mg total) by mouth daily. 12/16/20   Collene Gobble, MD  losartan (COZAAR) 100 MG tablet Take 1 tablet (100 mg total) by mouth daily. 09/18/20 03/17/21  [name], MD  metFORMIN (GLUCOPHAGE XR) 750 MG 24 hr tablet Take 1 tablet (750 mg total) by mouth daily with breakfast. 12/09/20   Janith Lima, MD  mometasone-formoterol [facility]) 200-5 MCG/ACT AERO Take 2 puffs first thing in am and then another 2 puffs about 12 hours later. 12/11/20   Collene Gobble, MD  montelukast (SINGULAIR) 10 MG tablet Take 1 tablet (10 mg total) by mouth at bedtime. 09/12/20   Janith Lima, MD  nabumetone (RELAFEN) 500 MG tablet Take 1 tablet (500 mg total) by mouth 2 (two) times daily as needed. 12/06/20   Janith Lima, MD  omeprazole (PRILOSEC) 20 MG capsule Take 1 capsule (20 mg total) by mouth 2 (two) times daily before a meal. 07/28/20   Janith Lima, MD  predniSONE (DELTASONE) 10 MG tablet Take 1 tablet (10 mg total) by mouth daily with breakfast. 12/11/20   Collene Gobble, MD  roflumilast (DALIRESP) 500 MCG TABS tablet Take 1 tablet (500 mcg  total) by mouth daily. 06/12/20   Collene Gobble, MD  Tiotropium Bromide Monohydrate (SPIRIVA RESPIMAT) 2.5 MCG/ACT AERS Inhale 2.5 mcg into the lungs 2 (two) times daily. 12/11/20   Collene Gobble, MD  torsemide (DEMADEX) 20 MG tablet Take 2 tablets (40 mg total) by mouth daily. 01/11/20   Bensimhon, Shaune Pascal, MD    Physical Exam: Vitals:   12/17/20 1119 12/17/20 1135 12/17/20 1230 12/17/20 1330  BP: (!) 167/109 (!) 145/98 (!) 154/93 (!) 158/102  Pulse: 99 (!) 102 82 (!) 112  Resp: (!) 24 (!) 32 14 (!) 31  Temp:      TempSrc:      SpO2: 92% 93% 97% 95%    General: 64 y.o. male resting in bed in NAD  Eyes: PERRL, normal sclera ENMT: Nares patent w/o discharge, orophaynx clear, dentition normal, ears w/o discharge/lesions/ulcers Neck: Supple, trachea midline Cardiovascular: RRR, +S1, S2, no m/g/r, equal pulses throughout Respiratory: soft rhonchi at base, exp wheeze, normal WOB on 2L [place] GI: BS+, NDNT, no masses noted, anasarca MSK: No c/c, BLE edema extending to stomach Skin: No rashes, bruises, ulcerations noted Neuro: A&O x 3, no focal deficits Psyc: Appropriate interaction and affect, calm/cooperative  Labs on Admission: I have personally reviewed following labs and imaging studies  CBC: Recent Labs  Lab 12/17/20 1134  WBC 9.4  HGB 17.1*  HCT 52.1*  MCV 100.8*  PLT 135*   Basic Metabolic Panel: Recent Labs  Lab 12/17/20 1134  NA 140  K 4.0  CL 99  CO2 30  GLUCOSE 116*  BUN 12  CREATININE 0.84  CALCIUM 8.9   GFR: Estimated Creatinine Clearance: 116.3 mL/min (by C-G formula based on SCr of 0.84 mg/dL). Liver Function Tests: No results for input(s): AST, ALT, ALKPHOS, BILITOT, PROT, ALBUMIN in the last 168 hours. No results for input(s): LIPASE, AMYLASE in the last 168 hours. No results for input(s): AMMONIA in the last 168 hours. Coagulation Profile: No results for input(s): INR, PROTIME in the last 168 hours. Cardiac Enzymes: No results for input(s):  CKTOTAL, CKMB, CKMBINDEX, TROPONINI in the last 168 hours. BNP (last 3 results) No results for input(s): PROBNP in the last 8760 hours. HbA1C: No results for input(s): HGBA1C in the last 72 hours. CBG: No results for input(s): GLUCAP in the last 168 hours. Lipid Profile: No results for input(s): CHOL, HDL, LDLCALC, TRIG, CHOLHDL, LDLDIRECT in the last 72 hours. Thyroid Function Tests: No results for input(s): TSH, T4TOTAL, FREET4, T3FREE, THYROIDAB in the last 72 hours. Anemia Panel: No results for input(s): VITAMINB12, FOLATE, FERRITIN, TIBC, IRON, RETICCTPCT in the last 72 hours. Urine analysis: No results found for: COLORURINE, APPEARANCEUR, LABSPEC, PHURINE, GLUCOSEU, HGBUR, BILIRUBINUR, KETONESUR, PROTEINUR, UROBILINOGEN, NITRITE, LEUKOCYTESUR  Radiological Exams on Admission: DG Chest 2 View  Result Date: 12/17/2020 CLINICAL DATA:  Shortness of breath EXAM: CHEST - 2 VIEW COMPARISON:  June 27, 2018 FINDINGS: There is mild left midlung atelectasis. No edema or airspace opacity. There is mild cardiomegaly with a degree of pulmonary venous hypertension. No evident adenopathy. No bone lesions. IMPRESSION: Mild cardiomegaly with pulmonary vascular congestion. No edema or consolidation. Mild left midlung atelectatic change. Electronically Signed   By: Bretta Bang III M.D.   On: 12/17/2020 11:22    EKG: Independently reviewed. Sinus tach, no st elevation  Assessment/Plan Acute on chronic combined HF Anasarca    - admit to inpt, tele    - lasix 60 mg IV BID    - check echo, daily wts, I&O    - fluid restriction to 1200cc    - consult cardiology/HF team  COPD     - continue inhalers, steroids  Elevated troponins     - mild, flat, no CP, follow  Thrombocytopenia     - no evidence of bleed     - check LFTs?  HTN     - resume home BP meds  Anxiety     - resume home meds  HLD     - continue zetia  DM2     - last A1c 6.6 (11/28/20)     - on metformin; hold     -  SSI, DM2 diet, glucose checks  DVT prophylaxis: lovenox  Code Status: DNI  Family Communication: None at bedside.  Consults called: None   Status is: Inpatient  Remains inpatient appropriate because:Inpatient level of care appropriate due to severity of illness   Dispo: The patient is from: Home              Anticipated d/c is to: Home              Anticipated d/c date is: 3 days              Patient currently is not medically stable to d/c.  Jonnie Finner DO [facility]  If 7PM-7AM, please contact night-coverage [email or web address]  12/17/2020, 2:24 PM

## 2020-12-17 NOTE — Plan of Care (Signed)
  Problem: Education: Goal: Knowledge of General Education information will improve Description Including pain rating scale, medication(s)/side effects and non-pharmacologic comfort measures Outcome: Progressing   

## 2020-12-18 ENCOUNTER — Inpatient Hospital Stay (HOSPITAL_COMMUNITY): Payer: 59

## 2020-12-18 ENCOUNTER — Other Ambulatory Visit: Payer: Self-pay

## 2020-12-18 DIAGNOSIS — I428 Other cardiomyopathies: Secondary | ICD-10-CM

## 2020-12-18 DIAGNOSIS — L03119 Cellulitis of unspecified part of limb: Secondary | ICD-10-CM

## 2020-12-18 DIAGNOSIS — I5043 Acute on chronic combined systolic (congestive) and diastolic (congestive) heart failure: Secondary | ICD-10-CM | POA: Diagnosis not present

## 2020-12-18 DIAGNOSIS — I10 Essential (primary) hypertension: Secondary | ICD-10-CM | POA: Diagnosis not present

## 2020-12-18 DIAGNOSIS — D696 Thrombocytopenia, unspecified: Secondary | ICD-10-CM

## 2020-12-18 DIAGNOSIS — R600 Localized edema: Secondary | ICD-10-CM | POA: Diagnosis not present

## 2020-12-18 DIAGNOSIS — I872 Venous insufficiency (chronic) (peripheral): Secondary | ICD-10-CM | POA: Diagnosis not present

## 2020-12-18 DIAGNOSIS — I5021 Acute systolic (congestive) heart failure: Secondary | ICD-10-CM | POA: Diagnosis not present

## 2020-12-18 DIAGNOSIS — E118 Type 2 diabetes mellitus with unspecified complications: Secondary | ICD-10-CM | POA: Diagnosis not present

## 2020-12-18 DIAGNOSIS — I5041 Acute combined systolic (congestive) and diastolic (congestive) heart failure: Secondary | ICD-10-CM

## 2020-12-18 DIAGNOSIS — Z720 Tobacco use: Secondary | ICD-10-CM

## 2020-12-18 DIAGNOSIS — L03311 Cellulitis of abdominal wall: Secondary | ICD-10-CM

## 2020-12-18 DIAGNOSIS — M546 Pain in thoracic spine: Secondary | ICD-10-CM

## 2020-12-18 LAB — CBC
HCT: 54.4 % — ABNORMAL HIGH (ref 39.0–52.0)
Hemoglobin: 17.2 g/dL — ABNORMAL HIGH (ref 13.0–17.0)
MCH: 33 pg (ref 26.0–34.0)
MCHC: 31.6 g/dL (ref 30.0–36.0)
MCV: 104.2 fL — ABNORMAL HIGH (ref 80.0–100.0)
Platelets: 142 10*3/uL — ABNORMAL LOW (ref 150–400)
RBC: 5.22 MIL/uL (ref 4.22–5.81)
RDW: 13.2 % (ref 11.5–15.5)
WBC: 12.1 10*3/uL — ABNORMAL HIGH (ref 4.0–10.5)
nRBC: 0 % (ref 0.0–0.2)

## 2020-12-18 LAB — COMPREHENSIVE METABOLIC PANEL
ALT: 23 U/L (ref 0–44)
AST: 24 U/L (ref 15–41)
Albumin: 3.8 g/dL (ref 3.5–5.0)
Alkaline Phosphatase: 73 U/L (ref 38–126)
Anion gap: 12 (ref 5–15)
BUN: 17 mg/dL (ref 8–23)
CO2: 31 mmol/L (ref 22–32)
Calcium: 8.6 mg/dL — ABNORMAL LOW (ref 8.9–10.3)
Chloride: 97 mmol/L — ABNORMAL LOW (ref 98–111)
Creatinine, Ser: 1.01 mg/dL (ref 0.61–1.24)
GFR, Estimated: >60 mL/min (ref >60)
Glucose, Bld: 106 mg/dL — ABNORMAL HIGH (ref 70–99)
Potassium: 4 mmol/L (ref 3.5–5.1)
Sodium: 140 mmol/L (ref 135–145)
Total Bilirubin: 1.7 mg/dL — ABNORMAL HIGH (ref 0.3–1.2)
Total Protein: 6.9 g/dL (ref 6.5–8.1)

## 2020-12-18 LAB — ECHOCARDIOGRAM COMPLETE
Calc EF: 40.3 %
Height: 70 in
S' Lateral: 4.4 cm
Single Plane A2C EF: 39.2 %
Single Plane A4C EF: 40.8 %
Weight: 4088 oz

## 2020-12-18 LAB — GLUCOSE, CAPILLARY
Glucose-Capillary: 110 mg/dL — ABNORMAL HIGH (ref 70–99)
Glucose-Capillary: 136 mg/dL — ABNORMAL HIGH (ref 70–99)
Glucose-Capillary: 89 mg/dL (ref 70–99)
Glucose-Capillary: 111 mg/dL — ABNORMAL HIGH (ref 70–99)

## 2020-12-18 MED ORDER — PANTOPRAZOLE SODIUM 40 MG PO TBEC
40.0000 mg | DELAYED_RELEASE_TABLET | Freq: Two times a day (BID) | ORAL | Status: DC
  Administered 2020-12-18 – 2020-12-30 (×22): 40 mg via ORAL
  Filled 2020-12-18 (×22): qty 1

## 2020-12-18 MED ORDER — PANTOPRAZOLE SODIUM 40 MG PO TBEC: 40.0000 mg | DELAYED_RELEASE_TABLET | Freq: Two times a day (BID) | ORAL | Status: DC

## 2020-12-18 MED ORDER — FUROSEMIDE 10 MG/ML IJ SOLN
80.0000 mg | Freq: Three times a day (TID) | INTRAMUSCULAR | Status: DC
  Administered 2020-12-18 – 2020-12-25 (×21): 80 mg via INTRAVENOUS
  Filled 2020-12-18 (×22): qty 8

## 2020-12-18 NOTE — Progress Notes (Signed)
Requested PT Sp02 be monitored via telemetry or bedside pulse ox.

## 2020-12-18 NOTE — Progress Notes (Signed)
Echocardiogram 2D Echocardiogram has been performed.  Robert Church Labreck 12/18/2020, 9:48 AM

## 2020-12-18 NOTE — Progress Notes (Addendum)
PROGRESS NOTE  Robert Church NID:[id number] DOB: 04-Apr-1956 DOA: 12/17/2020 PCP: [name], MD   LOS: 1 day   Brief narrative: As per HPI,  Robert Church is a 64 y.o. male with medical history significant of CHF, COPD, HTN who presented to hospital with bilateral leg swelling and abdominal distention.  Compliance with his heart failure medication but despite that he continued to have increased swelling.  Of note the patient had seen his primary care physician who had plans to obtain CT abdomen of his chest abdomen and pelvis as outpatient.  In the ED patient was noted to have there is edema extending up to the abdomen with BNP at1300+. CXR showed some pulmonary congestion. He was given IV lasix and was considered for admission to the hospital for acute decompensated heart failure  Assessment/Plan:  Principal Problem:   Acute CHF (congestive heart failure) (HCC) Active Problems:   HTN (hypertension)   Type II diabetes mellitus with manifestations (HCC)   Thrombocytopenia (HCC)  Acute on chronic combined HF Extensive edema of the lower extremities up to the abdomen.  On IV Lasix 60 twice daily.  Check 2D echocardiogram.  Daily weights.  Strict intake and output charting.  Fluid restriction 1200 mL/day.  Cardiology has been consulted for further evaluation and treatment.  Patient did have some good diuresis overnight.  COPD Continue inhalers, nebulizers and prednisone.  Patient is also on Daliresp as outpatient.  Elevated troponins Elevated but flat troponin.  No chest pain.  Unlikely to be ACS.  Thrombocytopenia Mild.  No evidence of bleeding.  Latest platelet count of 142.  HTN On Cozaar, currently on IV Lasix.   Follow blood pressure closely.  Anxiety    Continue Xanax.  Hyperlipidemia     - continue zetia  Diabetes mellitus type 2. Hemoglobin A1c 6.6 (11/28/20).  Continue to hold Metformin.  Continue sliding scale insulin Accu-Cheks diabetic  diet.  DVT prophylaxis: Lovenox subcu   Code Status: Partial code  Family Communication:   Spoke with patient's mother at bedside and updated her about the clinical condition of the patient.   Status is: Inpatient  Remains inpatient appropriate because:IV treatments appropriate due to intensity of illness or inability to take PO and Inpatient level of care appropriate due to severity of illness   Dispo: The patient is from: Home              Anticipated d/c is to: Home              Anticipated d/c date is: 2 days              Patient currently is not medically stable to d/c.   Consultants:  Cardiology  Procedures:  None  Antibiotics:  . None  Anti-infectives (From admission, onward)   None     Subjective: Today, patient was seen and examined at bedside.  States that his breathing is slightly improved.  Denies any chest pain, dizziness, lightheadedness.  Has gross swelling of his legs.  Objective: Vitals:   12/18/20 0404 12/18/20 0500  BP: (!) 137/94   Pulse: (!) 111   Resp: 20 20  Temp: 98.4 F (36.9 C)   SpO2: 90%     Intake/Output Summary (Last 24 hours) at 12/18/2020 0814 Last data filed at 12/18/2020 0600 Gross per 24 hour  Intake 180 ml  Output 1340 ml  Net -1160 ml   Filed Weights   12/17/20 1621 12/18/20 0546  Weight: 118.9 kg 115.9  kg   Body mass index is 36.66 kg/m.   Physical Exam: GENERAL: Patient is alert awake and oriented. Not in obvious distress.  Obese, oxygen by nasal cannula, HENT: No scleral pallor or icterus. Pupils equally reactive to light. Oral mucosa is moist NECK: is supple, no gross swelling noted. CHEST: Bilateral basal diffuse crackles noted.  Diminished breath sounds bilaterally. CVS: S1 and S2 heard, no murmur. Regular rate and rhythm.  ABDOMEN: Soft, non-tender, bowel sounds are present. EXTREMITIES: Bilateral lower extremity edema extending up to the lower abdomen with evidence of chronic venous  insufficiency. CNS: Cranial nerves are intact. No focal motor deficits. SKIN: warm and dry without rashes.  Data Review: I have personally reviewed the following laboratory data and studies,  CBC: Recent Labs  Lab 12/17/20 1134 12/18/20 0459  WBC 9.4 12.1*  HGB 17.1* 17.2*  HCT 52.1* 54.4*  MCV 100.8* 104.2*  PLT 135* 142*   Basic Metabolic Panel: Recent Labs  Lab 12/17/20 1134 12/17/20 1305 12/18/20 0459  NA 140  --  140  K 4.0  --  4.0  CL 99  --  97*  CO2 30  --  31  GLUCOSE 116*  --  106*  BUN 12  --  17  CREATININE 0.84  --  1.01  CALCIUM 8.9  --  8.6*  MG  --  1.8  --    Liver Function Tests: Recent Labs  Lab 12/17/20 1305 12/18/20 0459  AST 30 24  ALT 24 23  ALKPHOS 73 73  BILITOT 1.5* 1.7*  PROT 7.0 6.9  ALBUMIN 3.8 3.8   No results for input(s): LIPASE, AMYLASE in the last 168 hours. No results for input(s): AMMONIA in the last 168 hours. Cardiac Enzymes: No results for input(s): CKTOTAL, CKMB, CKMBINDEX, TROPONINI in the last 168 hours. BNP (last 3 results) Recent Labs    01/11/20 1444 12/17/20 1134  BNP 267.3* 1,374.9*    ProBNP (last 3 results) No results for input(s): PROBNP in the last 8760 hours.  CBG: Recent Labs  Lab 12/17/20 1713 12/17/20 2105 12/18/20 0725  GLUCAP 111* 129* 110*   Recent Results (from the past 240 hour(s))  Resp Panel by RT-PCR (Flu A&B, Covid) Nasopharyngeal Swab     Status: None   Collection Time: 12/17/20 11:34 AM   Specimen: Nasopharyngeal Swab; Nasopharyngeal(NP) swabs in vial transport medium  Result Value Ref Range Status   SARS Coronavirus 2 by RT PCR NEGATIVE NEGATIVE Final    Comment: (NOTE) SARS-CoV-2 target nucleic acids are NOT DETECTED.  The SARS-CoV-2 RNA is generally detectable in upper respiratory specimens during the acute phase of infection. The lowest concentration of SARS-CoV-2 viral copies this assay can detect is 138 copies/mL. A negative result does not preclude  SARS-Cov-2 infection and should not be used as the sole basis for treatment or other patient management decisions. A negative result may occur with  improper specimen collection/handling, submission of specimen other than nasopharyngeal swab, presence of viral mutation(s) within the areas targeted by this assay, and inadequate number of viral copies(<138 copies/mL). A negative result must be combined with clinical observations, patient history, and epidemiological information. The expected result is Negative.  Fact Sheet for Patients:  BloggerCourse.com  Fact Sheet for Healthcare Providers:  SeriousBroker.it  This test is no t yet approved or cleared by the [place] FDA and  has been authorized for detection and/or diagnosis of SARS-CoV-2 by FDA under an Emergency Use Authorization (EUA). This EUA will remain  in effect (meaning this test can be used) for the duration of the COVID-19 declaration under Section 564(b)(1) of the Act, 21 U.S.C.section 360bbb-3(b)(1), unless the authorization is terminated  or revoked sooner.       Influenza A by PCR NEGATIVE NEGATIVE Final   Influenza B by PCR NEGATIVE NEGATIVE Final    Comment: (NOTE) The Xpert Xpress SARS-CoV-2/FLU/RSV plus assay is intended as an aid in the diagnosis of influenza from Nasopharyngeal swab specimens and should not be used as a sole basis for treatment. Nasal washings and aspirates are unacceptable for Xpert Xpress SARS-CoV-2/FLU/RSV testing.  Fact Sheet for Patients: EntrepreneurPulse.com.au  Fact Sheet for Healthcare Providers: IncredibleEmployment.be  This test is not yet approved or cleared by the [place] FDA and has been authorized for detection and/or diagnosis of SARS-CoV-2 by FDA under an Emergency Use Authorization (EUA). This EUA will remain in effect (meaning this test can be used) for the duration of  the COVID-19 declaration under Section 564(b)(1) of the Act, 21 U.S.C. section 360bbb-3(b)(1), unless the authorization is terminated or revoked.  Performed at [facility], [place] [address] [place],  [address]      Studies: DG Chest 2 View  Result Date: 12/17/2020 CLINICAL DATA:  Shortness of breath EXAM: CHEST - 2 VIEW COMPARISON:  June 27, 2018 FINDINGS: There is mild left midlung atelectasis. No edema or airspace opacity. There is mild cardiomegaly with a degree of pulmonary venous hypertension. No evident adenopathy. No bone lesions. IMPRESSION: Mild cardiomegaly with pulmonary vascular congestion. No edema or consolidation. Mild left midlung atelectatic change. Electronically Signed   By: Lowella Grip III M.D.   On: 12/17/2020 11:22      Flora Lipps, MD  [facility] 12/18/2020  If 7PM-7AM, please contact night-coverage

## 2020-12-18 NOTE — Consult Note (Addendum)
Cardiology Consultation:   Patient ID: Robert Church MRN: [id number]; DOB: November 21, 1956  Admit date: 12/17/2020 Date of Consult: 12/18/2020  Primary Care Provider: Janith Lima, MD [facility] Cardiologist: Quay Burow, MD  [facility] Electrophysiologist:  None    Patient Profile:   Robert Church is a 64 y.o. male with a hx of hypertension, tobacco abuse, alcohol use, COPD, OSA, nonischemic cardiomyopathy and obesity who is being seen today for the evaluation of CHF at the request of Dr. Louanne Belton.  History of Present Illness:   Robert Church followed by Dr. Gwenlyn Found for the above cardiac issues.  Patient had a cardiac cath in April 2018 that showed normal coronaries with a EF of 40%.  It was felt the cardiomyopathy was due to uncontrolled hypertension.  Echo 06/2018 showed EF 30 to 35%.  He was referred to advanced heart failure clinic who switched his carvedilol and started him on Entresto.  Unfortunately he did not tolerate Entresto and was changed back to losartan.  Repeat echo December 2020 showed EF improved to 45%.  He was trialed on Entresto again however lost his job and he was unable to afford it.  Patient was last seen telehealth visit 09/19/2020 by Almyra Deforest, PA-C.  Spironolactone had been stopped for an unknown reason.  Pressures were mildly elevated.  He was started on hydralazine.  The patient presented to the ER at [facility] 12/17/2020 cough, lower leg edema, and DOE. He reported a couple months of worsening lower leg swelling and abdominal swelling. The patient fell a couple months ago and hurt his knee and back ago and felt swelling started to get worse after this. He also reported minimal chest tightness. He says he ischronically sob however this has been worse the last couple months as well. He takes torsemide daily. He reports he generally low salt diet. He is still smoking, 15 cigarettes a week.  He is also drinking 2-3 beers every day. He does not take his  weight every day but says dry weight is around 237lbs.  The ED blood pressure was elevated, 176/99, 75, afebrile, respiratory rate 22.  He was started on oxygen for mild hypoxia.  Labs showed BNP elevated to 1374. HS troponin 26>27.  Hemoglobin 17.1, platelets 135.  EKG showed sinus tach with a rate of 100 bpm, RBBB, 1st degree AV block, nonspecific T wave changes, Q waves ant/lat leads.  Chest x-ray showed scattered congestion.  Patient was given losartan 100 mg, hydralazine 25 mg and IV Lasix and admitted for further evaluation.   Past Medical History:  Diagnosis Date   Allergy    Asthma    Bronchitis    CHF (congestive heart failure) (HCC)    COPD (chronic obstructive pulmonary disease) (HCC)    GERD (gastroesophageal reflux disease)    Hypertension    Pneumonia    Sinusitis    SOB (shortness of breath)     Past Surgical History:  Procedure Laterality Date   LEFT HEART CATH AND CORONARY ANGIOGRAPHY N/A 04/19/2017   Procedure: Left Heart Cath and Coronary Angiography;  Surgeon: Belva Crome, MD;  Location: [facility];  Service: Cardiovascular;  Laterality: N/A;   TONSILLECTOMY       Home Medications:  Prior to Admission medications   Medication Sig Start Date End Date Taking? Authorizing Provider  albuterol (PROVENTIL) (2.5 MG/3ML) 0.083% nebulizer solution Take 3 mLs (2.5 mg total) by nebulization every 6 (six) hours as needed for wheezing or shortness  of breath. 10/01/20  Yes Collene Gobble, MD  albuterol (VENTOLIN HFA) 108 (90 Base) MCG/ACT inhaler Inhale 2 puffs into the lungs every 6 (six) hours as needed for wheezing. 12/11/20  Yes Collene Gobble, MD  allopurinol (ZYLOPRIM) 300 MG tablet Take 1 tablet (300 mg total) by mouth daily. 11/29/20  Yes Janith Lima, MD  ALPRAZolam Duanne Moron) 1 MG tablet Take 1 mg by mouth at bedtime as needed for anxiety.   Yes [provider]  Aspirin-Salicylamide-Caffeine (BC HEADACHE POWDER PO) Take 1 each by mouth daily as  needed (headache).   Yes [provider]  Azelastine-Fluticasone 137-50 MCG/ACT SUSP 1 spray each nostril twice daily Patient taking differently: Place 1 spray into both nostrils daily as needed (allergy). 08/31/14  Yes Collene Gobble, MD  cholecalciferol (VITAMIN D) 1000 units tablet Take 1,000 Units by mouth daily.   Yes [provider]  ezetimibe (ZETIA) 10 MG tablet Take 1 tablet (10 mg total) by mouth daily. 07/23/20  Yes Janith Lima, MD  levofloxacin (LEVAQUIN) 500 MG tablet Take 1 tablet (500 mg total) by mouth daily. Patient taking differently: Take 500 mg by mouth daily. Start date : 12/16/20 12/16/20  Yes Collene Gobble, MD  losartan (COZAAR) 100 MG tablet Take 1 tablet (100 mg total) by mouth daily. 09/18/20 03/17/21 Yes [name], MD  metFORMIN (GLUCOPHAGE XR) 750 MG 24 hr tablet Take 1 tablet (750 mg total) by mouth daily with breakfast. 12/09/20  Yes Janith Lima, MD  mometasone-formoterol [facility]) 200-5 MCG/ACT AERO Take 2 puffs first thing in am and then another 2 puffs about 12 hours later. Patient taking differently: Inhale 2 puffs into the lungs 2 (two) times daily. 12/11/20  Yes Collene Gobble, MD  montelukast (SINGULAIR) 10 MG tablet Take 1 tablet (10 mg total) by mouth at bedtime. 09/12/20  Yes Janith Lima, MD  omeprazole (PRILOSEC) 20 MG capsule Take 1 capsule (20 mg total) by mouth 2 (two) times daily before a meal. Patient taking differently: Take 20 mg by mouth daily. 07/28/20  Yes Janith Lima, MD  predniSONE (DELTASONE) 10 MG tablet Take 1 tablet (10 mg total) by mouth daily with breakfast. 12/11/20  Yes Byrum, Rose Fillers, MD  roflumilast (DALIRESP) 500 MCG TABS tablet Take 1 tablet (500 mcg total) by mouth daily. 06/12/20  Yes Collene Gobble, MD  Tiotropium Bromide Monohydrate (SPIRIVA RESPIMAT) 2.5 MCG/ACT AERS Inhale 2.5 mcg into the lungs 2 (two) times daily. 12/11/20  Yes Collene Gobble, MD  torsemide (DEMADEX) 20 MG tablet Take 20  mg by mouth daily.   Yes [provider]  traMADol (ULTRAM) 50 MG tablet Take 50 mg by mouth every 6 (six) hours as needed for severe pain.   Yes [provider]  Budeson-Glycopyrrol-Formoterol (BREZTRI AEROSPHERE) 160-9-4.8 MCG/ACT AERO Inhale 2 puffs into the lungs in the morning and at bedtime. Patient not taking: No sig reported 12/05/20   Collene Gobble, MD  ciprofloxacin (CIPRO) 500 MG tablet Take 1 tablet (500 mg total) by mouth 2 (two) times daily. Patient taking differently: Take 500 mg by mouth 2 (two) times daily. Start date 12/12/20 12/12/20   Ulyses Amor, PA-C  Colchicine (MITIGARE) 0.6 MG CAPS Take 1 tablet by mouth 2 (two) times daily. Patient not taking: No sig reported 08/08/20   Janith Lima, MD  EPINEPHrine 0.3 mg/0.3 mL IJ SOAJ injection Inject 0.3 mLs (0.3 mg total) into the muscle as  needed for anaphylaxis. Patient not taking: No sig reported 08/08/20   [name], MD  hydrALAZINE (APRESOLINE) 10 MG tablet Take 1 tablet (10 mg total) by mouth in the morning and at bedtime. Patient not taking: No sig reported 09/19/20   Azalee Course, PA  Insulin Pen Needle (NOVOFINE) 32G X 6 MM MISC 1 Act by Does not apply route once a week. 08/05/20   [name], MD  nabumetone (RELAFEN) 500 MG tablet Take 1 tablet (500 mg total) by mouth 2 (two) times daily as needed. Patient taking differently: Take 500 mg by mouth 2 (two) times daily as needed for moderate pain. 12/06/20   [name], MD  torsemide (DEMADEX) 20 MG tablet Take 2 tablets (40 mg total) by mouth daily. Patient not taking: No sig reported 01/11/20   Bensimhon, Bevelyn Buckles, MD    Inpatient Medications: Scheduled Meds:  allopurinol  300 mg Oral Daily   enoxaparin (LOVENOX) injection  60 mg Subcutaneous Q24H   ezetimibe  10 mg Oral Daily   furosemide  60 mg Intravenous BID   insulin aspart  0-9 Units Subcutaneous TID WC   losartan  100 mg Oral Daily   mometasone-formoterol  2 puff Inhalation  BID   montelukast  10 mg Oral QHS   predniSONE  10 mg Oral Q breakfast   roflumilast  500 mcg Oral Daily   umeclidinium bromide  1 puff Inhalation Daily   Continuous Infusions:   PRN Meds: acetaminophen **OR** acetaminophen, albuterol, ALPRAZolam, hydrALAZINE, nabumetone, ondansetron **OR** ondansetron (ZOFRAN) IV, traMADol  Allergies:    Allergies  Allergen Reactions   Clindamycin Other (See Comments)    Tongue swelling   Doxycycline     Severe stomach upset per patient   E-Mycin [Erythromycin Base] Swelling   Penicillins Swelling and Other (See Comments)    Childhood rxn--MD stated he "almost died" Has patient had a PCN reaction causing immediate rash, facial/tongue/throat swelling, SOB or lightheadedness with hypotension:Yes Has patient had a PCN reaction causing severe rash involving mucus membranes or skin necrosis:unsure Has patient had a PCN reaction that required hospitalization:unsure Has patient had a PCN reaction occurring within the last 10 years:No If all of the above answers are "NO", then may proceed with Cephalosporin use.     Rosuvastatin     Other reaction(s): Myalgias (intolerance)    Social History:   Social History   Socioeconomic History   Marital status: Single    Spouse name: Not on file   Number of children: Not on file   Years of education: Not on file   Highest education level: Not on file  Occupational History   Occupation: Curator  Tobacco Use   Smoking status: Current Every Day Smoker    Packs/day: 0.25    Years: 46.00    Pack years: 11.50    Types: Cigarettes   Smokeless tobacco: Never Used   Tobacco comment: 3 cigarettes smoked daily  ARJ 12/05/20  Vaping Use   Vaping Use: Never used  Substance and Sexual Activity   Alcohol use: Yes    Alcohol/week: 0.0 standard drinks   Drug use: No   Sexual activity: Yes    Birth control/protection: None  Other Topics Concern   Not on file  Social History Narrative   Not on file    Social Determinants of Health   Financial Resource Strain: Not on file  Food Insecurity: Not on file  Transportation Needs: Not on file  Physical Activity: Not  on file  Stress: Not on file  Social Connections: Not on file  Intimate Partner Violence: Not on file    Family History:   Family History  Problem Relation Age of Onset   Lung cancer Father    Brain cancer Father      ROS:  Please see the history of present illness.  All other ROS reviewed and negative.     Physical Exam/Data:   Vitals:   12/18/20 0404 12/18/20 0500 12/18/20 0546 12/18/20 0826  BP: (!) 137/94     Pulse: (!) 111     Resp: 20 20    Temp: 98.4 F (36.9 C)     TempSrc:      SpO2: 90%   (!) 88%  Weight:   115.9 kg   Height:        Intake/Output Summary (Last 24 hours) at 12/18/2020 0958 Last data filed at 12/18/2020 0845 Gross per 24 hour  Intake 420 ml  Output 1615 ml  Net -1195 ml   Last 3 Weights 12/18/2020 12/17/2020 12/12/2020  Weight (lbs) 255 lb 8 oz 262 lb 3.2 oz 256 lb  Weight (kg) 115.894 kg 118.933 kg 116.121 kg     Body mass index is 36.66 kg/m.  General:  Well nourished, well developed, in no acute distress HEENT: normal Lymph: no adenopathy Neck: difficult to assess but suspect JVD Endocrine:  No thryomegaly Vascular: No carotid bruits; FA pulses 2+ bilaterally without bruits  Cardiac:  normal S1, S2; RR, tachycardic; no murmur  Lungs:  Diffuse wheezing with crackles   Abd: distended, no hepatomegaly  Ext: 3+ b/l edema, edmea in both hands Musculoskeletal:  No deformities, BUE and BLE strength normal and equal Skin: warm and dry  Neuro:  CNs 2-12 intact, no focal abnormalities noted Psych:  Normal affect   EKG:  The EKG was personally reviewed and demonstrates:  ST, 100bpm, 1st degree AV block, RBBB, possible LAE, nonspecific T wave changes, q waves ant/lat leads ?new Telemetry:  Telemetry was personally reviewed and demonstrates:  ST, first degree AV block,  PVC/PAC, HR 100-120  Relevant CV Studies:  Echo pending  Echo 12/10/2019  1. Left ventricular ejection fraction, by 3D EF is 45%. The left  ventricle has mild to moderately decreased function. Moderately increased  left ventricular posterior wall thickness. There is mildly increased left  ventricular hypertrophy.   2. Abnormal septal motion consistent with interventricular conduction  delay.   3. Elevated left ventricular end-diastolic pressure.   4. Global right ventricle has mildly reduced systolic function.The right  ventricular size is normal. No increase in right ventricular wall  thickness.   5. Left atrial size was normal.   6. Right atrial size was normal.   7. The mitral valve is normal in structure. No evidence of mitral valve  regurgitation. No evidence of mitral stenosis.   8. The tricuspid valve is normal in structure. Tricuspid valve  regurgitation is trivial.   9. The aortic valve is tricuspid. Aortic valve regurgitation is not  visualized. Mild aortic valve sclerosis without stenosis.  10. The pulmonic valve was normal in structure. Pulmonic valve  regurgitation is trivial.  11. Normal pulmonary artery systolic pressure.  12. The inferior vena cava is normal in size with greater than 50%  respiratory variability, suggesting right atrial pressure of 3 mmHg.  13. The left ventricle demonstrates global hypokinesis.   In comparison to the previous echocardiogram(s): The left ventricular  function is unchanged.   Cardiac cath  04/09/2017 Normal coronary arteries. Global hypokinesis, EF approximately 40%. LVEDP 18 mmHg.     RECOMMENDATIONS:   Sleep study to rule out obstructive sleep apnea. Guideline mandated heart failure therapy.    Laboratory Data:  High Sensitivity Troponin:   Recent Labs  Lab 12/17/20 1134 12/17/20 1305  TROPONINIHS 26* 27*     Chemistry Recent Labs  Lab 12/17/20 1134 12/18/20 0459  NA 140 140  K 4.0 4.0  CL 99 97*  CO2 30  31  GLUCOSE 116* 106*  BUN 12 17  CREATININE 0.84 1.01  CALCIUM 8.9 8.6*  GFRNONAA >60 >60  ANIONGAP 11 12    Recent Labs  Lab 12/17/20 1305 12/18/20 0459  PROT 7.0 6.9  ALBUMIN 3.8 3.8  AST 30 24  ALT 24 23  ALKPHOS 73 73  BILITOT 1.5* 1.7*   Hematology Recent Labs  Lab 12/17/20 1134 12/18/20 0459  WBC 9.4 12.1*  RBC 5.17 5.22  HGB 17.1* 17.2*  HCT 52.1* 54.4*  MCV 100.8* 104.2*  MCH 33.1 33.0  MCHC 32.8 31.6  RDW 13.0 13.2  PLT 135* 142*   BNP Recent Labs  Lab 12/17/20 1134  BNP 1,374.9*    DDimer No results for input(s): DDIMER in the last 168 hours.   Radiology/Studies:  DG Chest 2 View  Result Date: 12/17/2020 CLINICAL DATA:  Shortness of breath EXAM: CHEST - 2 VIEW COMPARISON:  June 27, 2018 FINDINGS: There is mild left midlung atelectasis. No edema or airspace opacity. There is mild cardiomegaly with a degree of pulmonary venous hypertension. No evident adenopathy. No bone lesions. IMPRESSION: Mild cardiomegaly with pulmonary vascular congestion. No edema or consolidation. Mild left midlung atelectatic change. Electronically Signed   By: Lowella Grip III M.D.   On: 12/17/2020 11:22     Assessment and Plan:   Acute on chronic systolic and diastolic heart failure/nonischemic cardiomyopathy -Presents with a couple months of progressive lower leg edema, abdominal swelling, dyspnea on exertion -PTA torsemide 40mg  daily -Started on IV Lasix 60 twice daily -Echo ordered>results pending. Last echo showed EF 40-45% (unchagned from prior) - Patient put out 1.6L urine overnight - weights 262lbs>252lbs. Pt reports dry weight around 237lbs - Unable to afford ENtresto in the past. Continue losartan - spiro stopped in the past for unknown reason -Could not tolerate bisoprolol due to fatigue - creatinine stable - Patient with significant volume overload. Would increase lasix - monitor strict I/os, daily weights, creatinine with diuresis - discussed daily  weights and low salt diet. Also recommended decreased alcohol intake.   COPD with ongoing tobacco use -Inhaler and steroids per primary team - still smoking, cessation was discussed  Elevated troponin -Mildly elevated with flat trend - No chest pain -Cath in 2018 showed normal coronaries  Hypertension -Elevated on admission -Home meds Hydralazine 10mg  TID, losartan 100mg  daily  Continued -Patient did not tolerate bisoprolol in the past due to fatigue. Also has COPD. Can consider coreg - Pressures reasonable. Can increase hydralazine or try Imdur  Hyperlipidemia -Zetia continued  Diabetes type 2 - A1C 6.6 11/2020 - per Primary team  OSA on CPAP  -Plan was to establish as outpatient with Dr. Claiborne Billings   For questions or updates, please contact [facility] Please consult [email or web address] for contact info under    Signed, Shaunte Tuft Ninfa Meeker, PA-C  12/18/2020 9:58 AM

## 2020-12-19 DIAGNOSIS — D696 Thrombocytopenia, unspecified: Secondary | ICD-10-CM | POA: Diagnosis not present

## 2020-12-19 DIAGNOSIS — I5041 Acute combined systolic (congestive) and diastolic (congestive) heart failure: Secondary | ICD-10-CM

## 2020-12-19 DIAGNOSIS — I10 Essential (primary) hypertension: Secondary | ICD-10-CM | POA: Diagnosis not present

## 2020-12-19 DIAGNOSIS — J9601 Acute respiratory failure with hypoxia: Secondary | ICD-10-CM

## 2020-12-19 DIAGNOSIS — J441 Chronic obstructive pulmonary disease with (acute) exacerbation: Secondary | ICD-10-CM

## 2020-12-19 DIAGNOSIS — E118 Type 2 diabetes mellitus with unspecified complications: Secondary | ICD-10-CM | POA: Diagnosis not present

## 2020-12-19 LAB — COMPREHENSIVE METABOLIC PANEL
ALT: 23 U/L (ref 0–44)
AST: 27 U/L (ref 15–41)
Albumin: 3.6 g/dL (ref 3.5–5.0)
Alkaline Phosphatase: 70 U/L (ref 38–126)
Anion gap: 12 (ref 5–15)
BUN: 27 mg/dL — ABNORMAL HIGH (ref 8–23)
CO2: 34 mmol/L — ABNORMAL HIGH (ref 22–32)
Calcium: 8.8 mg/dL — ABNORMAL LOW (ref 8.9–10.3)
Chloride: 93 mmol/L — ABNORMAL LOW (ref 98–111)
Creatinine, Ser: 1.12 mg/dL (ref 0.61–1.24)
GFR, Estimated: >60 mL/min (ref >60)
Glucose, Bld: 126 mg/dL — ABNORMAL HIGH (ref 70–99)
Potassium: 4 mmol/L (ref 3.5–5.1)
Sodium: 139 mmol/L (ref 135–145)
Total Bilirubin: 1.2 mg/dL (ref 0.3–1.2)
Total Protein: 6.8 g/dL (ref 6.5–8.1)

## 2020-12-19 LAB — BLOOD GAS, ARTERIAL
Acid-Base Excess: 6.6 mmol/L — ABNORMAL HIGH (ref 0.0–2.0)
Acid-Base Excess: 9.7 mmol/L — ABNORMAL HIGH (ref 0.0–2.0)
Bicarbonate: 39.6 mmol/L — ABNORMAL HIGH (ref 20.0–28.0)
Bicarbonate: 41.7 mmol/L — ABNORMAL HIGH (ref 20.0–28.0)
O2 Saturation: 91.9 %
O2 Saturation: 93.7 %
Patient temperature: 98.6
Patient temperature: 98.6
pCO2 arterial: 101 mmHg (ref 32.0–48.0)
pCO2 arterial: 93.6 mmHg (ref 32.0–48.0)
pH, Arterial: 7.219 — ABNORMAL LOW (ref 7.350–7.450)
pH, Arterial: 7.271 — ABNORMAL LOW (ref 7.350–7.450)
pO2, Arterial: 71.8 mmHg — ABNORMAL LOW (ref 83.0–108.0)
pO2, Arterial: 74.1 mmHg — ABNORMAL LOW (ref 83.0–108.0)

## 2020-12-19 LAB — CBC
HCT: 54 % — ABNORMAL HIGH (ref 39.0–52.0)
Hemoglobin: 16.7 g/dL (ref 13.0–17.0)
MCH: 32.8 pg (ref 26.0–34.0)
MCHC: 30.9 g/dL (ref 30.0–36.0)
MCV: 106.1 fL — ABNORMAL HIGH (ref 80.0–100.0)
Platelets: 129 10*3/uL — ABNORMAL LOW (ref 150–400)
RBC: 5.09 MIL/uL (ref 4.22–5.81)
RDW: 13.1 % (ref 11.5–15.5)
WBC: 10.6 10*3/uL — ABNORMAL HIGH (ref 4.0–10.5)
nRBC: 0 % (ref 0.0–0.2)

## 2020-12-19 LAB — GLUCOSE, CAPILLARY
Glucose-Capillary: 109 mg/dL — ABNORMAL HIGH (ref 70–99)
Glucose-Capillary: 164 mg/dL — ABNORMAL HIGH (ref 70–99)
Glucose-Capillary: 109 mg/dL — ABNORMAL HIGH (ref 70–99)
Glucose-Capillary: 157 mg/dL — ABNORMAL HIGH (ref 70–99)

## 2020-12-19 LAB — D-DIMER, QUANTITATIVE: D-Dimer, Quant: 1.98 ug/mL-FEU — ABNORMAL HIGH (ref 0.00–0.50)

## 2020-12-19 LAB — PHOSPHORUS: Phosphorus: 4 mg/dL (ref 2.5–4.6)

## 2020-12-19 LAB — MAGNESIUM: Magnesium: 1.8 mg/dL (ref 1.7–2.4)

## 2020-12-19 MED ORDER — ALUM & MAG HYDROXIDE-SIMETH 200-200-20 MG/5ML PO SUSP
30.0000 mL | ORAL | Status: DC | PRN
  Administered 2020-12-19 – 2020-12-26 (×2): 30 mL via ORAL
  Filled 2020-12-19 (×2): qty 30

## 2020-12-19 MED ORDER — ALBUTEROL SULFATE (2.5 MG/3ML) 0.083% IN NEBU
2.5000 mg | INHALATION_SOLUTION | RESPIRATORY_TRACT | Status: DC | PRN
  Administered 2020-12-19 – 2020-12-21 (×2): 2.5 mg via RESPIRATORY_TRACT
  Filled 2020-12-19 (×2): qty 3

## 2020-12-19 MED ORDER — METHYLPREDNISOLONE SODIUM SUCC 40 MG IJ SOLR
40.0000 mg | Freq: Two times a day (BID) | INTRAMUSCULAR | Status: DC
  Administered 2020-12-19 – 2020-12-23 (×8): 40 mg via INTRAVENOUS
  Filled 2020-12-19 (×8): qty 1

## 2020-12-19 NOTE — Progress Notes (Addendum)
PROGRESS NOTE  Robert Church [id number] DOB: 08/04/1956 DOA: 12/17/2020 PCP: Janith Lima, MD   LOS: 2 days   Brief narrative: As per HPI,  Robert Church is a 64 y.o. male with medical history significant of CHF, COPD, HTN who presented to hospital with bilateral leg swelling and abdominal distention.  Compliance with his heart failure medication but despite that he continued to have increased swelling.  Of note the patient had seen his primary care physician who had plans to obtain CT abdomen of his chest abdomen and pelvis as outpatient.  In the ED patient was noted to have there is edema extending up to the abdomen with BNP at1300+. CXR showed some pulmonary congestion. He was given IV lasix and was considered for admission to the hospital for acute decompensated heart failure  Assessment/Plan:  Principal Problem:   Acute CHF (congestive heart failure) (HCC) Active Problems:   HTN (hypertension)   Type II diabetes mellitus with manifestations ([place])   Thrombocytopenia ([place])  Acute on chronic combined HF Extensive edema of the lower extremities up to the abdomen.  On IV Lasix 80 mg TID now. Had good diuresis but still feels very congested. Patient did have a increasing oxygen requirement up to 8 L 23 sounds very congested. We will change the frequency of albuterol every 4 hours. with 2D echocardiogram from 12/18/2020 showed LV ejection fraction of 40 to 45% with global hypokinesis of the left ventricular wall and diastolic dysfunction grade 2. Continue daily weights.  Strict intake and output charting.  Fluid restriction 1200 mL/day. Follow cardiology recommendation.  COPD with acute exacerbation. Continue inhalers, nebulizers and prednisone.  Patient is also on Daliresp as outpatient. Recent frequency of albuterol today. Add incentive spirometry. T-max noted 100.6 F yesterday. We will add empiric doxycycline for underlying bronchitis.  Elevated troponins Elevated but  flat troponin.  No chest pain.  Unlikely to be ACS.  Thrombocytopenia Mild.  No evidence of bleeding.  Latest platelet count of 129  HTN On Cozaar, currently on IV Lasix.   Follow blood pressure closely.  Anxiety    Continue Xanax.  Hyperlipidemia     - continue zetia  Diabetes mellitus type 2. Hemoglobin A1c 6.6 (11/28/20).  Continue to hold Metformin.  Continue sliding scale insulin Accu-Cheks diabetic diet. P0C glucose of 109.  Addendum:  12/19/2020 2:27 PM  Nursing staff reported that the patient was somnolent. ABG was performed which showed extremely elevated PCO2. We will start the patient on BiPAP. Patient is a DO NOT INTUBATE this was confirmed with the patient as well as patient's mother. Patient stated that he did not wish to be on BiPAP so I spoke with the mother who is going to talk with the patient. Overall prognosis of the patient seems to be guarded at this time due to extensive fluid overload encephalopathy hypercarbia and worsening hypoxia. Patient's mother is aware of the guarded prognosis.  DVT prophylaxis: Lovenox subcu  Code Status: Partial code. No intubation.  Family Communication:  Spoke with patient's mother phone today and updated her about the clinical condition of the patient. Patient's mother stated that he do not intubate  Status is: Inpatient  Remains inpatient appropriate because:IV treatments appropriate due to intensity of illness or inability to take PO and Inpatient level of care appropriate due to severity of illness, decompensated heart failure requiring escalating dose of IV diuresis, encephalopathy   Dispo: The patient is from: Home  Anticipated d/c is to: Home              Anticipated d/c date is: 2 to 3 days              Patient currently is not medically stable to d/c.   Consultants:  Cardiology  Procedures:  None  Antibiotics:  . None  Anti-infectives (From admission, onward)   None      Subjective: Today, patient was seen and examined at bedside. Patient complains of cough congestion and shortness of breath and dyspnea. Nursing staff reported that his oxygen requirement was increased to 8 L/min.   Objective: Vitals:   12/19/20 0818 12/19/20 0910  BP:    Pulse:  (!) 51  Resp:    Temp:    SpO2: (!) 86% 93%    Intake/Output Summary (Last 24 hours) at 12/19/2020 0946 Last data filed at 12/19/2020 0942 Gross per 24 hour  Intake 300 ml  Output 2625 ml  Net -2325 ml   Filed Weights   12/17/20 1621 12/18/20 0546  Weight: 118.9 kg 115.9 kg   Body mass index is 36.66 kg/m.   Physical Exam: GENERAL: Patient is alert awake and oriented. In mild distress, feels congested, obese, oxygen by nasal cannula at 8 L/min. HENT: No scleral pallor or icterus. Pupils equally reactive to light. Oral mucosa is moist NECK: is supple, no gross swelling noted. CHEST: Bilateral basal diffuse crackles and rhonchi noted diminished breath sounds bilaterally. CVS: S1 and S2 heard, no murmur. Regular rate and rhythm.  ABDOMEN: Soft, non-tender, bowel sounds are present. EXTREMITIES: Bilateral lower extremity edema extending up to the lower abdomen with evidence of chronic venous insufficiency. CNS: Cranial nerves are intact. No focal motor deficits. SKIN: warm and dry without rashes.  Data Review: I have personally reviewed the following laboratory data and studies,  CBC: Recent Labs  Lab 12/17/20 1134 12/18/20 0459 12/19/20 0519  WBC 9.4 12.1* 10.6*  HGB 17.1* 17.2* 16.7  HCT 52.1* 54.4* 54.0*  MCV 100.8* 104.2* 106.1*  PLT 135* 142* [id number]*   Basic Metabolic Panel: Recent Labs  Lab 12/17/20 1134 12/17/20 1305 12/18/20 0459 12/19/20 0519  NA 140  --  140 139  K 4.0  --  4.0 4.0  CL 99  --  97* 93*  CO2 30  --  31 34*  GLUCOSE 116*  --  106* 126*  BUN 12  --  17 27*  CREATININE 0.84  --  1.01 1.12  CALCIUM 8.9  --  8.6* 8.8*  MG  --  1.8  --  1.8  PHOS  --   --    --  4.0   Liver Function Tests: Recent Labs  Lab 12/17/20 1305 12/18/20 0459 12/19/20 0519  AST 30 24 27   ALT 24 23 23   ALKPHOS 73 73 70  BILITOT 1.5* 1.7* 1.2  PROT 7.0 6.9 6.8  ALBUMIN 3.8 3.8 3.6   No results for input(s): LIPASE, AMYLASE in the last 168 hours. No results for input(s): AMMONIA in the last 168 hours. Cardiac Enzymes: No results for input(s): CKTOTAL, CKMB, CKMBINDEX, TROPONINI in the last 168 hours. BNP (last 3 results) Recent Labs    01/11/20 1444 12/17/20 1134  BNP 267.3* 1,374.9*    ProBNP (last 3 results) No results for input(s): PROBNP in the last 8760 hours.  CBG: Recent Labs  Lab 12/18/20 0725 12/18/20 1121 12/18/20 1738 12/18/20 2059 12/19/20 0808  GLUCAP 110* 136* 89 111* 109*  Recent Results (from the past 240 hour(s))  Resp Panel by RT-PCR (Flu A&B, Covid) Nasopharyngeal Swab     Status: None   Collection Time: 12/17/20 11:34 AM   Specimen: Nasopharyngeal Swab; Nasopharyngeal(NP) swabs in vial transport medium  Result Value Ref Range Status   SARS Coronavirus 2 by RT PCR NEGATIVE NEGATIVE Final    Comment: (NOTE) SARS-CoV-2 target nucleic acids are NOT DETECTED.  The SARS-CoV-2 RNA is generally detectable in upper respiratory specimens during the acute phase of infection. The lowest concentration of SARS-CoV-2 viral copies this assay can detect is 138 copies/mL. A negative result does not preclude SARS-Cov-2 infection and should not be used as the sole basis for treatment or other patient management decisions. A negative result may occur with  improper specimen collection/handling, submission of specimen other than nasopharyngeal swab, presence of viral mutation(s) within the areas targeted by this assay, and inadequate number of viral copies(<138 copies/mL). A negative result must be combined with clinical observations, patient history, and epidemiological information. The expected result is Negative.  Fact Sheet for  Patients:  EntrepreneurPulse.com.au  Fact Sheet for Healthcare Providers:  IncredibleEmployment.be  This test is no t yet approved or cleared by the [place] FDA and  has been authorized for detection and/or diagnosis of SARS-CoV-2 by FDA under an Emergency Use Authorization (EUA). This EUA will remain  in effect (meaning this test can be used) for the duration of the COVID-19 declaration under Section 564(b)(1) of the Act, 21 U.S.C.section 360bbb-3(b)(1), unless the authorization is terminated  or revoked sooner.       Influenza A by PCR NEGATIVE NEGATIVE Final   Influenza B by PCR NEGATIVE NEGATIVE Final    Comment: (NOTE) The Xpert Xpress SARS-CoV-2/FLU/RSV plus assay is intended as an aid in the diagnosis of influenza from Nasopharyngeal swab specimens and should not be used as a sole basis for treatment. Nasal washings and aspirates are unacceptable for Xpert Xpress SARS-CoV-2/FLU/RSV testing.  Fact Sheet for Patients: EntrepreneurPulse.com.au  Fact Sheet for Healthcare Providers: IncredibleEmployment.be  This test is not yet approved or cleared by the [place] FDA and has been authorized for detection and/or diagnosis of SARS-CoV-2 by FDA under an Emergency Use Authorization (EUA). This EUA will remain in effect (meaning this test can be used) for the duration of the COVID-19 declaration under Section 564(b)(1) of the Act, 21 U.S.C. section 360bbb-3(b)(1), unless the authorization is terminated or revoked.  Performed at [facility], [place] [address], [place], [place] [address]      Studies: DG Chest 2 View  Result Date: 12/17/2020 CLINICAL DATA:  Shortness of breath EXAM: CHEST - 2 VIEW COMPARISON:  June 27, 2018 FINDINGS: There is mild left midlung atelectasis. No edema or airspace opacity. There is mild cardiomegaly with a degree of pulmonary venous hypertension. No  evident adenopathy. No bone lesions. IMPRESSION: Mild cardiomegaly with pulmonary vascular congestion. No edema or consolidation. Mild left midlung atelectatic change. Electronically Signed   By: Lowella Grip III M.D.   On: 12/17/2020 11:22   ECHOCARDIOGRAM COMPLETE  Result Date: 12/18/2020    ECHOCARDIOGRAM REPORT   Patient Name:   Robert Church Date of Exam: 12/18/2020 Medical Rec #:  [id number]           Height:       70.0 in Accession #:    [id number]          Weight:       255.5 lb Date of Birth:  May 20, 1956            BSA:          2.316 m Patient Age:    31 years            BP:           137/94 mmHg Patient Gender: M                   HR:           111 bpm. Exam Location:  Inpatient Procedure: 2D Echo, Color Doppler and Cardiac Doppler Indications:    [id number] Acute systolic (congestive) heart failure  History:        Patient has prior history of Echocardiogram examinations, most                 recent 11/30/2019. CHF, COPD; Risk Factors:Hypertension,                 Dyslipidemia and Sleep Apnea.  Sonographer:    Raquel Sarna Senior RDCS Referring Phys: [id number] Jonnie Finner  Sonographer Comments: Scanned upright due to dyspnea. IMPRESSIONS  1. Left ventricular ejection fraction, by estimation, is 40 to 45%. The left ventricle has mildly decreased function. The left ventricle demonstrates global hypokinesis. The left ventricular internal cavity size was moderately dilated. There is mild left ventricular hypertrophy. Left ventricular diastolic parameters are consistent with Grade II diastolic dysfunction (pseudonormalization). Elevated left atrial pressure.  2. Right ventricular systolic function is mildly reduced. The right ventricular size is severely enlarged. There is moderately elevated pulmonary artery systolic pressure. The estimated right ventricular systolic pressure is 123456 mmHg.  3. Left atrial size was mildly dilated.  4. Right atrial size was mildly dilated.  5. The mitral valve is normal in  structure. No evidence of mitral valve regurgitation.  6. The aortic valve was not well visualized. Aortic valve regurgitation is not visualized. No aortic stenosis is present.  7. There is mild dilatation of the ascending aorta, measuring 37 mm.  8. The inferior vena cava is dilated in size with <50% respiratory variability, suggesting right atrial pressure of 15 mmHg. FINDINGS  Left Ventricle: Left ventricular ejection fraction, by estimation, is 40 to 45%. The left ventricle has mildly decreased function. The left ventricle demonstrates global hypokinesis. The left ventricular internal cavity size was moderately dilated. There is mild left ventricular hypertrophy. Left ventricular diastolic parameters are consistent with Grade II diastolic dysfunction (pseudonormalization). Elevated left atrial pressure. Right Ventricle: The right ventricular size is severely enlarged. Right vetricular wall thickness was not assessed. Right ventricular systolic function is mildly reduced. There is moderately elevated pulmonary artery systolic pressure. The tricuspid regurgitant velocity is 2.92 m/s, and with an assumed right atrial pressure of 15 mmHg, the estimated right ventricular systolic pressure is 123456 mmHg. Left Atrium: Left atrial size was mildly dilated. Right Atrium: Right atrial size was mildly dilated. Pericardium: Trivial pericardial effusion is present. Mitral Valve: The mitral valve is normal in structure. No evidence of mitral valve regurgitation. Tricuspid Valve: The tricuspid valve is normal in structure. Tricuspid valve regurgitation is trivial. Aortic Valve: The aortic valve was not well visualized. Aortic valve regurgitation is not visualized. No aortic stenosis is present. Pulmonic Valve: The pulmonic valve was not well visualized. Pulmonic valve regurgitation is not visualized. Aorta: The aortic root is normal in size and structure. There is mild dilatation of the ascending aorta, measuring 37 mm. Venous:  The inferior vena cava is dilated in size  with less than 50% respiratory variability, suggesting right atrial pressure of 15 mmHg. IAS/Shunts: The interatrial septum was not well visualized.  LEFT VENTRICLE PLAX 2D LVIDd:         6.00 cm      Diastology LVIDs:         4.40 cm      LV e' medial:  3.48 cm/s LV PW:         1.20 cm      LV e' lateral: 4.13 cm/s LV IVS:        1.20 cm LVOT diam:     2.20 cm LV SV:         63 LV SV Index:   27 LVOT Area:     3.80 cm  LV Volumes (MOD) LV vol d, MOD A2C: 149.0 ml LV vol d, MOD A4C: 140.0 ml LV vol s, MOD A2C: 90.6 ml LV vol s, MOD A4C: 82.9 ml LV SV MOD A2C:     58.4 ml LV SV MOD A4C:     140.0 ml LV SV MOD BP:      59.3 ml RIGHT VENTRICLE RV S prime:     13.90 cm/s TAPSE (M-mode): 2.4 cm LEFT ATRIUM           Index       RIGHT ATRIUM           Index LA diam:      4.20 cm 1.81 cm/m  RA Area:     22.70 cm LA Vol (A2C): 78.6 ml 33.93 ml/m RA Volume:   73.20 ml  31.60 ml/m LA Vol (A4C): 68.3 ml 29.49 ml/m  AORTIC VALVE LVOT Vmax:   112.03 cm/s LVOT Vmean:  84.833 cm/s LVOT VTI:    0.165 m  AORTA Ao Root diam: 3.60 cm Ao Asc diam:  3.70 cm TRICUSPID VALVE TR Peak grad:   34.1 mmHg TR Vmax:        292.00 cm/s  SHUNTS Systemic VTI:  0.16 m Systemic Diam: 2.20 cm Epifanio Lesches MD Electronically signed by Epifanio Lesches MD Signature Date/Time: 12/18/2020/12:52:30 PM    Final       Joycelyn Das, MD  [facility] 12/19/2020  If 7PM-7AM, please contact night-coverage

## 2020-12-19 NOTE — Progress Notes (Signed)
PT off BiPAP and on 6 LPM [place] with Sp02 of 86%- increased 02 to 7 lpm and placed order for >=88%-90% Sp02 goal. PT is alert and oriented with slight WOB at this time. PT refuses BiPAP at this time. Education provided on importance of using BiPAP- especially during hours of sleep (non compliant at home with OSA).

## 2020-12-19 NOTE — Progress Notes (Signed)
Pt off BiPAP since 1630, refusing to go back on at this time. Pt more alert and currently on 7L O2. MD aware. Will continue to monitor patient.

## 2020-12-19 NOTE — Progress Notes (Addendum)
Progress Note  Patient Name: Robert Church Date of Encounter: 12/19/2020  [facility] Cardiologist: Nanetta Batty, MD   Subjective   Patient put out 2.1L overnight. He slept in the chair. He feels breathing is the same. No chest pain. Lower leg edema improving.   Inpatient Medications    Scheduled Meds: . allopurinol  300 mg Oral Daily  . enoxaparin (LOVENOX) injection  60 mg Subcutaneous Q24H  . ezetimibe  10 mg Oral Daily  . furosemide  80 mg Intravenous TID PC  . insulin aspart  0-9 Units Subcutaneous TID WC  . losartan  100 mg Oral Daily  . mometasone-formoterol  2 puff Inhalation BID  . montelukast  10 mg Oral QHS  . pantoprazole  40 mg Oral BID  . predniSONE  10 mg Oral Q breakfast  . roflumilast  500 mcg Oral Daily  . umeclidinium bromide  1 puff Inhalation Daily   Continuous Infusions:  PRN Meds: acetaminophen **OR** acetaminophen, albuterol, ALPRAZolam, hydrALAZINE, nabumetone, ondansetron **OR** ondansetron (ZOFRAN) IV, traMADol   Vital Signs    Vitals:   12/18/20 2103 12/19/20 0425 12/19/20 0510 12/19/20 0818  BP: 104/77 (!) 154/82    Pulse: (!) 54 65    Resp: 20 (!) 24 19   Temp: 98.9 F (37.2 C) (!) 100.6 F (38.1 C)    TempSrc: Oral Oral    SpO2: 93% (!) 87%  (!) 86%  Weight:      Height:        Intake/Output Summary (Last 24 hours) at 12/19/2020 0908 Last data filed at 12/19/2020 0641 Gross per 24 hour  Intake 300 ml  Output 2450 ml  Net -2150 ml   Last 3 Weights 12/18/2020 12/17/2020 12/12/2020  Weight (lbs) 255 lb 8 oz 262 lb 3.2 oz 256 lb  Weight (kg) 115.894 kg 118.933 kg 116.121 kg      Telemetry    ST, HR 100-120, PACs/PVCs - Personally Reviewed  ECG    No new - Personally Reviewed  Physical Exam   GEN: No acute distress.   Neck: No JVD Cardiac: RR, tachycardic, no murmurs, rubs, or gallops.  Respiratory: course breath sounds, mild wheezing. GI: Soft, nontender, non-distended  MS: +lower leg edema; No  deformity. Neuro:  Nonfocal  Psych: Normal affect   Labs    High Sensitivity Troponin:   Recent Labs  Lab 12/17/20 1134 12/17/20 1305  TROPONINIHS 26* 27*      Chemistry Recent Labs  Lab 12/17/20 1134 12/17/20 1305 12/18/20 0459 12/19/20 0519  NA 140  --  140 139  K 4.0  --  4.0 4.0  CL 99  --  97* 93*  CO2 30  --  31 34*  GLUCOSE 116*  --  106* 126*  BUN 12  --  17 27*  CREATININE 0.84  --  1.01 1.12  CALCIUM 8.9  --  8.6* 8.8*  PROT  --  7.0 6.9 6.8  ALBUMIN  --  3.8 3.8 3.6  AST  --  30 24 27   ALT  --  24 23 23   ALKPHOS  --  73 73 70  BILITOT  --  1.5* 1.7* 1.2  GFRNONAA >60  --  >60 >60  ANIONGAP 11  --  12 12     Hematology Recent Labs  Lab 12/17/20 1134 12/18/20 0459 12/19/20 0519  WBC 9.4 12.1* 10.6*  RBC 5.17 5.22 5.09  HGB 17.1* 17.2* 16.7  HCT 52.1* 54.4* 54.0*  MCV  100.8* 104.2* 106.1*  MCH 33.1 33.0 32.8  MCHC 32.8 31.6 30.9  RDW 13.0 13.2 13.1  PLT 135* 142* 129*    BNP Recent Labs  Lab 12/17/20 1134  BNP 1,374.9*     DDimer No results for input(s): DDIMER in the last 168 hours.   Radiology    DG Chest 2 View  Result Date: 12/17/2020 CLINICAL DATA:  Shortness of breath EXAM: CHEST - 2 VIEW COMPARISON:  June 27, 2018 FINDINGS: There is mild left midlung atelectasis. No edema or airspace opacity. There is mild cardiomegaly with a degree of pulmonary venous hypertension. No evident adenopathy. No bone lesions. IMPRESSION: Mild cardiomegaly with pulmonary vascular congestion. No edema or consolidation. Mild left midlung atelectatic change. Electronically Signed   By: Bretta Bang III M.D.   On: 12/17/2020 11:22   ECHOCARDIOGRAM COMPLETE  Result Date: 12/18/2020    ECHOCARDIOGRAM REPORT   Patient Name:   Robert Church Date of Exam: 12/18/2020 Medical Rec #:  [id number]           Height:       70.0 in Accession #:    [id number]          Weight:       255.5 lb Date of Birth:  Apr 29, 1956            BSA:          2.316 m Patient  Age:    64 years            BP:           137/94 mmHg Patient Gender: M                   HR:           111 bpm. Exam Location:  Inpatient Procedure: 2D Echo, Color Doppler and Cardiac Doppler Indications:    I50.21 Acute systolic (congestive) heart failure  History:        Patient has prior history of Echocardiogram examinations, most                 recent 11/30/2019. CHF, COPD; Risk Factors:Hypertension,                 Dyslipidemia and Sleep Apnea.  Sonographer:    Irving Burton Senior RDCS Referring Phys: [id number] Teddy Spike  Sonographer Comments: Scanned upright due to dyspnea. IMPRESSIONS  1. Left ventricular ejection fraction, by estimation, is 40 to 45%. The left ventricle has mildly decreased function. The left ventricle demonstrates global hypokinesis. The left ventricular internal cavity size was moderately dilated. There is mild left ventricular hypertrophy. Left ventricular diastolic parameters are consistent with Grade II diastolic dysfunction (pseudonormalization). Elevated left atrial pressure.  2. Right ventricular systolic function is mildly reduced. The right ventricular size is severely enlarged. There is moderately elevated pulmonary artery systolic pressure. The estimated right ventricular systolic pressure is 49.1 mmHg.  3. Left atrial size was mildly dilated.  4. Right atrial size was mildly dilated.  5. The mitral valve is normal in structure. No evidence of mitral valve regurgitation.  6. The aortic valve was not well visualized. Aortic valve regurgitation is not visualized. No aortic stenosis is present.  7. There is mild dilatation of the ascending aorta, measuring 37 mm.  8. The inferior vena cava is dilated in size with <50% respiratory variability, suggesting right atrial pressure of 15 mmHg. FINDINGS  Left Ventricle: Left ventricular ejection fraction, by estimation, is 40 to  45%. The left ventricle has mildly decreased function. The left ventricle demonstrates global hypokinesis. The left  ventricular internal cavity size was moderately dilated. There is mild left ventricular hypertrophy. Left ventricular diastolic parameters are consistent with Grade II diastolic dysfunction (pseudonormalization). Elevated left atrial pressure. Right Ventricle: The right ventricular size is severely enlarged. Right vetricular wall thickness was not assessed. Right ventricular systolic function is mildly reduced. There is moderately elevated pulmonary artery systolic pressure. The tricuspid regurgitant velocity is 2.92 m/s, and with an assumed right atrial pressure of 15 mmHg, the estimated right ventricular systolic pressure is 123456 mmHg. Left Atrium: Left atrial size was mildly dilated. Right Atrium: Right atrial size was mildly dilated. Pericardium: Trivial pericardial effusion is present. Mitral Valve: The mitral valve is normal in structure. No evidence of mitral valve regurgitation. Tricuspid Valve: The tricuspid valve is normal in structure. Tricuspid valve regurgitation is trivial. Aortic Valve: The aortic valve was not well visualized. Aortic valve regurgitation is not visualized. No aortic stenosis is present. Pulmonic Valve: The pulmonic valve was not well visualized. Pulmonic valve regurgitation is not visualized. Aorta: The aortic root is normal in size and structure. There is mild dilatation of the ascending aorta, measuring 37 mm. Venous: The inferior vena cava is dilated in size with less than 50% respiratory variability, suggesting right atrial pressure of 15 mmHg. IAS/Shunts: The interatrial septum was not well visualized.  LEFT VENTRICLE PLAX 2D LVIDd:         6.00 cm      Diastology LVIDs:         4.40 cm      LV e' medial:  3.48 cm/s LV PW:         1.20 cm      LV e' lateral: 4.13 cm/s LV IVS:        1.20 cm LVOT diam:     2.20 cm LV SV:         63 LV SV Index:   27 LVOT Area:     3.80 cm  LV Volumes (MOD) LV vol d, MOD A2C: 149.0 ml LV vol d, MOD A4C: 140.0 ml LV vol s, MOD A2C: 90.6 ml LV vol  s, MOD A4C: 82.9 ml LV SV MOD A2C:     58.4 ml LV SV MOD A4C:     140.0 ml LV SV MOD BP:      59.3 ml RIGHT VENTRICLE RV S prime:     13.90 cm/s TAPSE (M-mode): 2.4 cm LEFT ATRIUM           Index       RIGHT ATRIUM           Index LA diam:      4.20 cm 1.81 cm/m  RA Area:     22.70 cm LA Vol (A2C): 78.6 ml 33.93 ml/m RA Volume:   73.20 ml  31.60 ml/m LA Vol (A4C): 68.3 ml 29.49 ml/m  AORTIC VALVE LVOT Vmax:   112.03 cm/s LVOT Vmean:  84.833 cm/s LVOT VTI:    0.165 m  AORTA Ao Root diam: 3.60 cm Ao Asc diam:  3.70 cm TRICUSPID VALVE TR Peak grad:   34.1 mmHg TR Vmax:        292.00 cm/s  SHUNTS Systemic VTI:  0.16 m Systemic Diam: 2.20 cm Oswaldo Milian MD Electronically signed by Oswaldo Milian MD Signature Date/Time: 12/18/2020/12:52:30 PM    Final     Cardiac Studies   Echo 12/18/20 1. Left ventricular ejection  fraction, by estimation, is 40 to 45%. The  left ventricle has mildly decreased function. The left ventricle  demonstrates global hypokinesis. The left ventricular internal cavity size  was moderately dilated. There is mild  left ventricular hypertrophy. Left ventricular diastolic parameters are  consistent with Grade II diastolic dysfunction (pseudonormalization).  Elevated left atrial pressure.  2. Right ventricular systolic function is mildly reduced. The right  ventricular size is severely enlarged. There is moderately elevated  pulmonary artery systolic pressure. The estimated right ventricular  systolic pressure is 123456 mmHg.  3. Left atrial size was mildly dilated.  4. Right atrial size was mildly dilated.  5. The mitral valve is normal in structure. No evidence of mitral valve  regurgitation.  6. The aortic valve was not well visualized. Aortic valve regurgitation  is not visualized. No aortic stenosis is present.  7. There is mild dilatation of the ascending aorta, measuring 37 mm.  8. The inferior vena cava is dilated in size with <50% respiratory   variability, suggesting right atrial pressure of 15 mmHg.   Patient Profile     64 y.o. male with a hx of hypertension, tobacco abuse, alcohol use, COPD, OSA, chronic venous insufficieny, nonischemic cardiomyopathy and obesity who is being seen today for the evaluation of CHF.  Assessment & Plan     Acute on chronic systolic and diastolic heart failure/nonischemic cardiomyopathy -Presents with a couple months of progressive lower leg edema, abdominal swelling, dyspnea on exertion - BNP 1374.  -PTA torsemide 40mg  daily -Started on IV Lasix 60 twice daily and UOP was acceptable - Lasix increased to 80 mg TID - Last echo showed EF 40-45%. Echo this admission showed EF 40-45% - UOP overnight 2.7L. net -3.3L - weight pending. Pt reports dry weight around 237lbs - Unable to afford Entresto in the past. Continue losartan - spiro stopped in the past for unknown reason - Heart rates up however could not tolerate bisoprolol due to fatigue. No CCb with low normal EF. ?Digoxin - creatinine stable - monitor strict I/os, daily weights, creatinine with diuresis - Still volume up on exam. Continue with diuresis  COPD with ongoing tobacco use -Inhaler and steroids per primary team - still smoking, cessation recommended  Elevated troponin -Mildly elevated with flat trend and not consistent with ACS - No chest pain -Cath in 2018 showed normal coronaries  Hypertension -Elevated on admission -Home meds Hydralazine 10mg  TID, losartan 100mg  daily were continued -Patient did not tolerate bisoprolol in the past due to fatigue.  - IV lasix - Pressures reasonable.   Hyperlipidemia -Zetia continued  Diabetes type 2 - A1C 6.6 11/2020 - per Primary team  OSA on CPAP  -Plan was to establish as outpatient with Dr. Claiborne Billings    For questions or updates, please contact [facility] Please consult [email or web address] for contact info under        Signed, Savana Spina Ninfa Meeker, PA-C  12/19/2020,  9:08 AM

## 2020-12-19 NOTE — Progress Notes (Signed)
RN reported that PT is now off BiPAP and will be placed on 6 LPM [place].

## 2020-12-19 NOTE — Progress Notes (Signed)
Requested RN address [id number] monitoring of telemetry box.

## 2020-12-19 NOTE — Progress Notes (Signed)
Notified PT that ABG being sent for analysis.

## 2020-12-19 NOTE — Progress Notes (Signed)
CRITICAL VALUE ALERT  Critical Value:  PCO2 101  Date & Time Notied:  12/19/20, 1402  Provider Notified: MD Pokhrel  Orders Received/Actions taken: Awaiting new orders.

## 2020-12-19 NOTE — Progress Notes (Signed)
This RN entered room to patient being disoriented and lethargic. Vitals obtained, patient now yellow MEWS. MD Pokhrel made aware of above. Will monitor patient.

## 2020-12-20 DIAGNOSIS — J9602 Acute respiratory failure with hypercapnia: Secondary | ICD-10-CM | POA: Diagnosis not present

## 2020-12-20 DIAGNOSIS — I5041 Acute combined systolic (congestive) and diastolic (congestive) heart failure: Secondary | ICD-10-CM | POA: Diagnosis not present

## 2020-12-20 DIAGNOSIS — J9601 Acute respiratory failure with hypoxia: Secondary | ICD-10-CM | POA: Diagnosis not present

## 2020-12-20 DIAGNOSIS — D696 Thrombocytopenia, unspecified: Secondary | ICD-10-CM | POA: Diagnosis not present

## 2020-12-20 DIAGNOSIS — I5021 Acute systolic (congestive) heart failure: Secondary | ICD-10-CM

## 2020-12-20 DIAGNOSIS — I10 Essential (primary) hypertension: Secondary | ICD-10-CM | POA: Diagnosis not present

## 2020-12-20 DIAGNOSIS — E118 Type 2 diabetes mellitus with unspecified complications: Secondary | ICD-10-CM

## 2020-12-20 LAB — BLOOD GAS, ARTERIAL
Acid-Base Excess: 14.3 mmol/L — ABNORMAL HIGH (ref 0.0–2.0)
Acid-Base Excess: 15.4 mmol/L — ABNORMAL HIGH (ref 0.0–2.0)
Bicarbonate: 46 mmol/L — ABNORMAL HIGH (ref 20.0–28.0)
Bicarbonate: 45.8 mmol/L — ABNORMAL HIGH (ref 20.0–28.0)
Delivery systems: POSITIVE
Drawn by: 308601
Expiratory PAP: 10
FIO2: 40
FIO2: 35
Inspiratory PAP: 20
O2 Saturation: 89.4 %
O2 Saturation: 91.4 %
Patient temperature: 98.6
Patient temperature: 98.6
pCO2 arterial: 92.5 mmHg (ref 32.0–48.0)
pCO2 arterial: 81.9 mmHg (ref 32.0–48.0)
pH, Arterial: 7.318 — ABNORMAL LOW (ref 7.350–7.450)
pH, Arterial: 7.366 (ref 7.350–7.450)
pO2, Arterial: 59.2 mmHg — ABNORMAL LOW (ref 83.0–108.0)
pO2, Arterial: 61.7 mmHg — ABNORMAL LOW (ref 83.0–108.0)

## 2020-12-20 LAB — PHOSPHORUS: Phosphorus: 3.8 mg/dL (ref 2.5–4.6)

## 2020-12-20 LAB — COMPREHENSIVE METABOLIC PANEL
ALT: 24 U/L (ref 0–44)
AST: 28 U/L (ref 15–41)
Albumin: 3.6 g/dL (ref 3.5–5.0)
Alkaline Phosphatase: 62 U/L (ref 38–126)
Anion gap: 12 (ref 5–15)
BUN: 36 mg/dL — ABNORMAL HIGH (ref 8–23)
CO2: 38 mmol/L — ABNORMAL HIGH (ref 22–32)
Calcium: 8.7 mg/dL — ABNORMAL LOW (ref 8.9–10.3)
Chloride: 90 mmol/L — ABNORMAL LOW (ref 98–111)
Creatinine, Ser: 1.02 mg/dL (ref 0.61–1.24)
GFR, Estimated: >60 mL/min (ref >60)
Glucose, Bld: 131 mg/dL — ABNORMAL HIGH (ref 70–99)
Potassium: 4.5 mmol/L (ref 3.5–5.1)
Sodium: 140 mmol/L (ref 135–145)
Total Bilirubin: 0.7 mg/dL (ref 0.3–1.2)
Total Protein: 6.9 g/dL (ref 6.5–8.1)

## 2020-12-20 LAB — GLUCOSE, CAPILLARY
Glucose-Capillary: 157 mg/dL — ABNORMAL HIGH (ref 70–99)
Glucose-Capillary: 164 mg/dL — ABNORMAL HIGH (ref 70–99)

## 2020-12-20 LAB — CBC
HCT: 55.5 % — ABNORMAL HIGH (ref 39.0–52.0)
Hemoglobin: 17.1 g/dL — ABNORMAL HIGH (ref 13.0–17.0)
MCH: 32.6 pg (ref 26.0–34.0)
MCHC: 30.8 g/dL (ref 30.0–36.0)
MCV: 105.7 fL — ABNORMAL HIGH (ref 80.0–100.0)
Platelets: 140 10*3/uL — ABNORMAL LOW (ref 150–400)
RBC: 5.25 MIL/uL (ref 4.22–5.81)
RDW: 12.6 % (ref 11.5–15.5)
WBC: 8.7 10*3/uL (ref 4.0–10.5)
nRBC: 0 % (ref 0.0–0.2)

## 2020-12-20 LAB — MAGNESIUM: Magnesium: 1.9 mg/dL (ref 1.7–2.4)

## 2020-12-20 MED ORDER — CHLORHEXIDINE GLUCONATE CLOTH 2 % EX PADS
6.0000 | MEDICATED_PAD | Freq: Every day | CUTANEOUS | Status: DC
  Administered 2020-12-21 – 2020-12-27 (×5): 6 via TOPICAL

## 2020-12-20 MED ORDER — ORAL CARE MOUTH RINSE
15.0000 mL | Freq: Two times a day (BID) | OROMUCOSAL | Status: DC
  Administered 2020-12-25 – 2020-12-30 (×6): 15 mL via OROMUCOSAL

## 2020-12-20 MED ORDER — CHLORHEXIDINE GLUCONATE 0.12 % MT SOLN
15.0000 mL | Freq: Two times a day (BID) | OROMUCOSAL | Status: DC
  Administered 2020-12-20 – 2020-12-30 (×20): 15 mL via OROMUCOSAL
  Filled 2020-12-20 (×17): qty 15

## 2020-12-20 MED ORDER — LEVOFLOXACIN IN D5W 500 MG/100ML IV SOLN
500.0000 mg | INTRAVENOUS | Status: DC
  Administered 2020-12-20 – 2020-12-21 (×2): 500 mg via INTRAVENOUS
  Filled 2020-12-20 (×2): qty 100

## 2020-12-20 NOTE — Progress Notes (Signed)
Pt was on 8L salter and still looks swolllen. Pt said he feels worse than when he came in. Rt asked the Pt if he would put the BIPAP on and he said no. RT explained to the Pt that the next step if he didn't wear a bipap was a ventilator. RT was able to lower his O2 to 4.5 L and he 90%. If the Pt's O2 drops he needs to woke and told to breath through his nose

## 2020-12-20 NOTE — Progress Notes (Signed)
PROGRESS NOTE  Robert Church [id number] DOB: 1956/10/16 DOA: 12/17/2020 PCP: Janith Lima, MD   LOS: 3 days   Brief narrative: As per HPI,  Robert Church is a 64 y.o. male with medical history significant of CHF, COPD, HTN who presented to hospital with bilateral leg swelling and abdominal distention.  Compliance with his heart failure medication but despite that he continued to have increased swelling.  Of note the patient had seen his primary care physician who had plans to obtain CT abdomen of his chest abdomen and pelvis as outpatient.  In the ED patient was noted to have there is edema extending up to the abdomen with BNP at1300+. CXR showed some pulmonary congestion. He was given IV lasix and was considered for admission to the hospital for acute decompensated heart failure  Assessment/Plan:  Principal Problem:   Acute CHF (congestive heart failure) (HCC) Active Problems:   HTN (hypertension)   Type II diabetes mellitus with manifestations ([place])   Thrombocytopenia ([place])  Acute on chronic combined HF Extensive edema of the lower extremities up to the abdomen.  On IV Lasix 80 mg TID per cardiology recommendations.  Has been having good diuresis with a net negative balance of more than 6 L.  Patient is still on 8 L of oxygen by nasal cannula.  We will change the frequency of albuterol every 4 hours. with 2D echocardiogram from 12/18/2020 showed LV ejection fraction of 40 to 45% with global hypokinesis of the left ventricular wall and diastolic dysfunction grade 2. Continue daily weights.  Strict intake and output charting.  Fluid restriction 1200 mL/day. Follow cardiology recommendation.  Acute hypoxic respiratory failure multifactorial secondary to CHF, COPD with exacerbation with underlying obesity.  Not much compliant with BiPAP.  Emphasized on BiPAP.  Continue IV diuresis.  Patient has been diuresing well.  Has been on IV steroids.  We will add antibiotic  today.  COPD with acute exacerbation. Patient had significant hypercarbia and did not use BiPAP for significant amount of time yesterday.  Appeared mildly somnolent this morning.  Encouraged use of BiPAP.  Continue inhalers, nebulizers and IV Solu-Medrol.  Patient was not on home oxygen at baseline.  Patient is also on Daliresp as outpatient.  Frequency of albuterol was increased yesterday.  Doxycycline noted as allergy.  Empirically added Levaquin today.  Will consult pulmonary today to assist in further care.  Trying to avoid intubation in this patient and emphasized BiPAP as much as possible.  Elevated troponins Elevated but flat troponin.  No chest pain.  Unlikely to be ACS.  Cardiology on board  Thrombocytopenia Mild.  No evidence of bleeding.  Latest platelet count of 140  HTN On Cozaar, currently on IV Lasix.   Follow blood pressure closely.  Anxiety    Continue Xanax as needed.  Hyperlipidemia     - continue zetia  Diabetes mellitus type 2. Hemoglobin A1c 6.6 (11/28/20).  Continue to hold Metformin.  Continue sliding scale insulin Accu-Cheks diabetic diet. P0C glucose of 164  Goals of care discussed with the patient as well as patient's mother at bedside at length today.  Patient was a DO NOT INTUBATE initially but at this point if patient would deteriorate they would like to try intubation.  If he was going to be on a ventilator for a long time at that time mom would make a decision regarding further care.  The CODE STATUS is full code at this time..  DVT prophylaxis: Lovenox subcu  Code  Status: Full code  Family Communication:  Spoke with patient's mother bedside for a prolonged duration on 2 separate occasions today regarding the clinical condition of the patient.  Discussed about goals of care.  At this time decision is to intubate the patient if necessary.  Status is: Inpatient  Remains inpatient appropriate because:IV treatments appropriate due to intensity of  illness or inability to take PO and Inpatient level of care appropriate due to severity of illness, decompensated heart failure requiring escalating dose of IV diuresis, metabolic encephalopathy, COPD exacerbation and hypercarbia requiring BiPAP, will transfer to stepdown unit for closer monitoring   Dispo: The patient is from: Home              Anticipated d/c is to: Home with home health              Anticipated d/c date is: 3 or more days              Patient currently is not medically stable to d/c.  Consultants:  Cardiology  Pulmonary.  Procedures:  None  Antibiotics:  . None  Anti-infectives (From admission, onward)   Start     Dose/Rate Route Frequency Ordered Stop   12/20/20 1500  levofloxacin (LEVAQUIN) IVPB 500 mg        500 mg 100 mL/hr over 60 Minutes Intravenous Every 24 hours 12/20/20 1340       Subjective: Today, patient was seen and examined at bedside.  Patient was seen separately two times including during rapid response.  Nursing staff reported that the patient was somnolent and not doing well.  Denied any chest pain, palpitation.  Had been reluctant on wearing BiPAP.  Objective: Vitals:   12/20/20 1122 12/20/20 1333  BP:  105/64  Pulse:  (!) 107  Resp:  18  Temp:  98.2 F (36.8 C)  SpO2: 91% 92%    Intake/Output Summary (Last 24 hours) at 12/20/2020 1412 Last data filed at 12/20/2020 1214 Gross per 24 hour  Intake 480 ml  Output 2750 ml  Net -2270 ml   Filed Weights   12/17/20 1621 12/18/20 0546  Weight: 118.9 kg 115.9 kg   Body mass index is 36.66 kg/m.   Physical Exam: GENERAL: Patient is mildly somnolent, in moderate distress,on oxygen by nasal cannula at 8 L/min. HENT: No scleral pallor or icterus. Pupils equally reactive to light. Oral mucosa is moist NECK: is supple, no gross swelling noted. CHEST: Bilateral coarse breath sounds noted. CVS: S1 and S2 heard, no murmur. Regular rate and rhythm.  ABDOMEN: Soft, non-tender, bowel  sounds are present, distended abdomen EXTREMITIES: Bilateral lower extremity edema extending up to the lower abdomen with evidence of chronic venous insufficiency. CNS: Cranial nerves are intact. No focal motor deficits. SKIN: warm and dry without rashes.  Data Review: I have personally reviewed the following laboratory data and studies,  CBC: Recent Labs  Lab 12/17/20 1134 12/18/20 0459 12/19/20 0519 12/20/20 0529  WBC 9.4 12.1* 10.6* 8.7  HGB 17.1* 17.2* 16.7 17.1*  HCT 52.1* 54.4* 54.0* 55.5*  MCV 100.8* 104.2* 106.1* 105.7*  PLT 135* 142* 129* 140*   Basic Metabolic Panel: Recent Labs  Lab 12/17/20 1134 12/17/20 1305 12/18/20 0459 12/19/20 0519 12/20/20 0529  NA 140  --  140 139 140  K 4.0  --  4.0 4.0 4.5  CL 99  --  97* 93* 90*  CO2 30  --  31 34* 38*  GLUCOSE 116*  --  106* 126*  131*  BUN 12  --  17 27* 36*  CREATININE 0.84  --  1.01 1.12 1.02  CALCIUM 8.9  --  8.6* 8.8* 8.7*  MG  --  1.8  --  1.8 1.9  PHOS  --   --   --  4.0 3.8   Liver Function Tests: Recent Labs  Lab 12/17/20 1305 12/18/20 0459 12/19/20 0519 12/20/20 0529  AST 30 24 27 28   ALT 24 23 23 24   ALKPHOS 73 73 70 62  BILITOT 1.5* 1.7* 1.2 0.7  PROT 7.0 6.9 6.8 6.9  ALBUMIN 3.8 3.8 3.6 3.6   No results for input(s): LIPASE, AMYLASE in the last 168 hours. No results for input(s): AMMONIA in the last 168 hours. Cardiac Enzymes: No results for input(s): CKTOTAL, CKMB, CKMBINDEX, TROPONINI in the last 168 hours. BNP (last 3 results) Recent Labs    01/11/20 1444 12/17/20 1134  BNP 267.3* 1,374.9*    ProBNP (last 3 results) No results for input(s): PROBNP in the last 8760 hours.  CBG: Recent Labs  Lab 12/19/20 1216 12/19/20 1634 12/19/20 2119 12/20/20 0724 12/20/20 1133  GLUCAP 164* 109* 157* 157* 164*   Recent Results (from the past 240 hour(s))  Resp Panel by RT-PCR (Flu A&B, Covid) Nasopharyngeal Swab     Status: None   Collection Time: 12/17/20 11:34 AM   Specimen:  Nasopharyngeal Swab; Nasopharyngeal(NP) swabs in vial transport medium  Result Value Ref Range Status   SARS Coronavirus 2 by RT PCR NEGATIVE NEGATIVE Final    Comment: (NOTE) SARS-CoV-2 target nucleic acids are NOT DETECTED.  The SARS-CoV-2 RNA is generally detectable in upper respiratory specimens during the acute phase of infection. The lowest concentration of SARS-CoV-2 viral copies this assay can detect is 138 copies/mL. A negative result does not preclude SARS-Cov-2 infection and should not be used as the sole basis for treatment or other patient management decisions. A negative result may occur with  improper specimen collection/handling, submission of specimen other than nasopharyngeal swab, presence of viral mutation(s) within the areas targeted by this assay, and inadequate number of viral copies(<138 copies/mL). A negative result must be combined with clinical observations, patient history, and epidemiological information. The expected result is Negative.  Fact Sheet for Patients:  EntrepreneurPulse.com.au  Fact Sheet for Healthcare Providers:  IncredibleEmployment.be  This test is no t yet approved or cleared by the [place] FDA and  has been authorized for detection and/or diagnosis of SARS-CoV-2 by FDA under an Emergency Use Authorization (EUA). This EUA will remain  in effect (meaning this test can be used) for the duration of the COVID-19 declaration under Section 564(b)(1) of the Act, 21 U.S.C.section 360bbb-3(b)(1), unless the authorization is terminated  or revoked sooner.       Influenza A by PCR NEGATIVE NEGATIVE Final   Influenza B by PCR NEGATIVE NEGATIVE Final    Comment: (NOTE) The Xpert Xpress SARS-CoV-2/FLU/RSV plus assay is intended as an aid in the diagnosis of influenza from Nasopharyngeal swab specimens and should not be used as a sole basis for treatment. Nasal washings and aspirates are unacceptable for  Xpert Xpress SARS-CoV-2/FLU/RSV testing.  Fact Sheet for Patients: EntrepreneurPulse.com.au  Fact Sheet for Healthcare Providers: IncredibleEmployment.be  This test is not yet approved or cleared by the [place] FDA and has been authorized for detection and/or diagnosis of SARS-CoV-2 by FDA under an Emergency Use Authorization (EUA). This EUA will remain in effect (meaning this test can be used) for the duration of  the COVID-19 declaration under Section 564(b)(1) of the Act, 21 U.S.C. section 360bbb-3(b)(1), unless the authorization is terminated or revoked.  Performed at [facility], [place] [address], [place], [place] [address]      Studies: No results found.    Flora Lipps, MD  [facility] 12/20/2020  If 7PM-7AM, please contact night-coverage

## 2020-12-20 NOTE — Significant Event (Signed)
Rapid Response Event Note   Reason for Call : Desaturations/Hypoxia Notified by 4th floor progressive RN, Mardella Layman, that patient was desaturating in the mid to upper 70s. Patient agreeing to wear BIPAP at this time. Respiratory Therapy notified as well. Upon arrival to room assisted with Respiratory Therapist, Robin, with placement of BIPAP. Patient's O2 Saturations improved to low 90s. Patient sitting up right in chair.   Initial Focused Assessment:  Neuro: responds to commands/voice, oriented x4, follows commands but lethargic at times. Patient has been noncompliant with wearing BIPAP.  Pulmonary: O2 Saturations improved with placement of BIPAP and utilization of, RR 18-25.   Interventions:  BIPAP utilized   Plan of Care:  Patient needs to continue to wear BIPAP to help with hypercarbia and hypoxia. If patient has change in mental status/becoming more lethargic or has any clinical deterioration please call rapid response at [phone].  Patient also along with mother at bedside that he wanted to be a full code instead DNI. Bedside RN, Respiratory Therapy, 4th floor charge made aware. Bedside RN, Mardella Layman communicated that will notified MD Pokhrel about code status.    Event Summary:   MD Notified:  Call Time: 1006 Arrival Time: 1008  End Time: 1030  Mettie Roylance C, RN

## 2020-12-20 NOTE — Progress Notes (Signed)
Pt. Refused Bipap. Pt. Was once again educated the importance of bipap. Pt. O2 sat is mid 90's on 7L

## 2020-12-20 NOTE — Progress Notes (Signed)
RT increased BIPAP setting due to ABG.

## 2020-12-20 NOTE — Progress Notes (Signed)
Progress Note  Patient Name: Robert Church Date of Encounter: 12/20/2020  [facility] Cardiologist: Nanetta Batty, MD   Subjective   Patient put out 1.6L overnight. Will request standing weight today. Patient refuses Bipap. On 7L O2 during exam. Says his breathing is worse today. Lower leg edema improving.   Inpatient Medications    Scheduled Meds: . allopurinol  300 mg Oral Daily  . enoxaparin (LOVENOX) injection  60 mg Subcutaneous Q24H  . ezetimibe  10 mg Oral Daily  . furosemide  80 mg Intravenous TID PC  . insulin aspart  0-9 Units Subcutaneous TID WC  . losartan  100 mg Oral Daily  . methylPREDNISolone (SOLU-MEDROL) injection  40 mg Intravenous Q12H  . mometasone-formoterol  2 puff Inhalation BID  . montelukast  10 mg Oral QHS  . pantoprazole  40 mg Oral BID  . roflumilast  500 mcg Oral Daily  . umeclidinium bromide  1 puff Inhalation Daily   Continuous Infusions:  PRN Meds: acetaminophen **OR** acetaminophen, albuterol, ALPRAZolam, alum & mag hydroxide-simeth, hydrALAZINE, nabumetone, ondansetron **OR** ondansetron (ZOFRAN) IV, traMADol   Vital Signs    Vitals:   12/19/20 2211 12/19/20 2222 12/20/20 0008 12/20/20 0508  BP:    (!) 143/92  Pulse:  (!) 103  (!) 56  Resp:  20  18  Temp:      TempSrc:      SpO2: 94% 91% 94% 93%  Weight:      Height:        Intake/Output Summary (Last 24 hours) at 12/20/2020 0822 Last data filed at 12/20/2020 0412 Gross per 24 hour  Intake 540 ml  Output 2225 ml  Net -1685 ml   Last 3 Weights 12/18/2020 12/17/2020 12/12/2020  Weight (lbs) 255 lb 8 oz 262 lb 3.2 oz 256 lb  Weight (kg) 115.894 kg 118.933 kg 116.121 kg      Telemetry    ST, HR100-120s, PVCs - Personally Reviewed  ECG    No new - Personally Reviewed  Physical Exam   GEN: No acute distress.   Neck: No JVD Cardiac: RR, tachycardic no murmurs, rubs, or gallops.  Respiratory: diffuse wheezing, crackles GI: Soft, nontender, non-distended   MS: 1-2+ lower leg edema; No deformity. Neuro:  Nonfocal  Psych: Normal affect   Labs    High Sensitivity Troponin:   Recent Labs  Lab 12/17/20 1134 12/17/20 1305  TROPONINIHS 26* 27*      Chemistry Recent Labs  Lab 12/18/20 0459 12/19/20 0519 12/20/20 0529  NA 140 139 140  K 4.0 4.0 4.5  CL 97* 93* 90*  CO2 31 34* 38*  GLUCOSE 106* 126* 131*  BUN 17 27* 36*  CREATININE 1.01 1.12 1.02  CALCIUM 8.6* 8.8* 8.7*  PROT 6.9 6.8 6.9  ALBUMIN 3.8 3.6 3.6  AST 24 27 28   ALT 23 23 24   ALKPHOS 73 70 62  BILITOT 1.7* 1.2 0.7  GFRNONAA >60 >60 >60  ANIONGAP 12 12 12      Hematology Recent Labs  Lab 12/18/20 0459 12/19/20 0519 12/20/20 0529  WBC 12.1* 10.6* 8.7  RBC 5.22 5.09 5.25  HGB 17.2* 16.7 17.1*  HCT 54.4* 54.0* 55.5*  MCV 104.2* 106.1* 105.7*  MCH 33.0 32.8 32.6  MCHC 31.6 30.9 30.8  RDW 13.2 13.1 12.6  PLT 142* 129* 140*    BNP Recent Labs  Lab 12/17/20 1134  BNP 1,374.9*     DDimer  Recent Labs  Lab 12/19/20 0954  DDIMER 1.98*  Radiology    ECHOCARDIOGRAM COMPLETE  Result Date: 12/18/2020    ECHOCARDIOGRAM REPORT   Patient Name:   Robert Church Date of Exam: 12/18/2020 Medical Rec #:  [id number]           Height:       70.0 in Accession #:    [id number]          Weight:       255.5 lb Date of Birth:  1956-01-25            BSA:          2.316 m Patient Age:    64 years            BP:           137/94 mmHg Patient Gender: M                   HR:           111 bpm. Exam Location:  Inpatient Procedure: 2D Echo, Color Doppler and Cardiac Doppler Indications:    [id number] Acute systolic (congestive) heart failure  History:        Patient has prior history of Echocardiogram examinations, most                 recent 11/30/2019. CHF, COPD; Risk Factors:Hypertension,                 Dyslipidemia and Sleep Apnea.  Sonographer:    Raquel Sarna Senior RDCS Referring Phys: [id number] Jonnie Finner  Sonographer Comments: Scanned upright due to dyspnea. IMPRESSIONS   1. Left ventricular ejection fraction, by estimation, is 40 to 45%. The left ventricle has mildly decreased function. The left ventricle demonstrates global hypokinesis. The left ventricular internal cavity size was moderately dilated. There is mild left ventricular hypertrophy. Left ventricular diastolic parameters are consistent with Grade II diastolic dysfunction (pseudonormalization). Elevated left atrial pressure.  2. Right ventricular systolic function is mildly reduced. The right ventricular size is severely enlarged. There is moderately elevated pulmonary artery systolic pressure. The estimated right ventricular systolic pressure is 123456 mmHg.  3. Left atrial size was mildly dilated.  4. Right atrial size was mildly dilated.  5. The mitral valve is normal in structure. No evidence of mitral valve regurgitation.  6. The aortic valve was not well visualized. Aortic valve regurgitation is not visualized. No aortic stenosis is present.  7. There is mild dilatation of the ascending aorta, measuring 37 mm.  8. The inferior vena cava is dilated in size with <50% respiratory variability, suggesting right atrial pressure of 15 mmHg. FINDINGS  Left Ventricle: Left ventricular ejection fraction, by estimation, is 40 to 45%. The left ventricle has mildly decreased function. The left ventricle demonstrates global hypokinesis. The left ventricular internal cavity size was moderately dilated. There is mild left ventricular hypertrophy. Left ventricular diastolic parameters are consistent with Grade II diastolic dysfunction (pseudonormalization). Elevated left atrial pressure. Right Ventricle: The right ventricular size is severely enlarged. Right vetricular wall thickness was not assessed. Right ventricular systolic function is mildly reduced. There is moderately elevated pulmonary artery systolic pressure. The tricuspid regurgitant velocity is 2.92 m/s, and with an assumed right atrial pressure of 15 mmHg, the estimated  right ventricular systolic pressure is 123456 mmHg. Left Atrium: Left atrial size was mildly dilated. Right Atrium: Right atrial size was mildly dilated. Pericardium: Trivial pericardial effusion is present. Mitral Valve: The mitral valve is normal in structure. No evidence of mitral valve  regurgitation. Tricuspid Valve: The tricuspid valve is normal in structure. Tricuspid valve regurgitation is trivial. Aortic Valve: The aortic valve was not well visualized. Aortic valve regurgitation is not visualized. No aortic stenosis is present. Pulmonic Valve: The pulmonic valve was not well visualized. Pulmonic valve regurgitation is not visualized. Aorta: The aortic root is normal in size and structure. There is mild dilatation of the ascending aorta, measuring 37 mm. Venous: The inferior vena cava is dilated in size with less than 50% respiratory variability, suggesting right atrial pressure of 15 mmHg. IAS/Shunts: The interatrial septum was not well visualized.  LEFT VENTRICLE PLAX 2D LVIDd:         6.00 cm      Diastology LVIDs:         4.40 cm      LV e' medial:  3.48 cm/s LV PW:         1.20 cm      LV e' lateral: 4.13 cm/s LV IVS:        1.20 cm LVOT diam:     2.20 cm LV SV:         63 LV SV Index:   27 LVOT Area:     3.80 cm  LV Volumes (MOD) LV vol d, MOD A2C: 149.0 ml LV vol d, MOD A4C: 140.0 ml LV vol s, MOD A2C: 90.6 ml LV vol s, MOD A4C: 82.9 ml LV SV MOD A2C:     58.4 ml LV SV MOD A4C:     140.0 ml LV SV MOD BP:      59.3 ml RIGHT VENTRICLE RV S prime:     13.90 cm/s TAPSE (M-mode): 2.4 cm LEFT ATRIUM           Index       RIGHT ATRIUM           Index LA diam:      4.20 cm 1.81 cm/m  RA Area:     22.70 cm LA Vol (A2C): 78.6 ml 33.93 ml/m RA Volume:   73.20 ml  31.60 ml/m LA Vol (A4C): 68.3 ml 29.49 ml/m  AORTIC VALVE LVOT Vmax:   112.03 cm/s LVOT Vmean:  84.833 cm/s LVOT VTI:    0.165 m  AORTA Ao Root diam: 3.60 cm Ao Asc diam:  3.70 cm TRICUSPID VALVE TR Peak grad:   34.1 mmHg TR Vmax:        292.00 cm/s   SHUNTS Systemic VTI:  0.16 m Systemic Diam: 2.20 cm Oswaldo Milian MD Electronically signed by Oswaldo Milian MD Signature Date/Time: 12/18/2020/12:52:30 PM    Final     Cardiac Studies   Echo 12/18/20 1. Left ventricular ejection fraction, by estimation, is 40 to 45%. The  left ventricle has mildly decreased function. The left ventricle  demonstrates global hypokinesis. The left ventricular internal cavity size  was moderately dilated. There is mild  left ventricular hypertrophy. Left ventricular diastolic parameters are  consistent with Grade II diastolic dysfunction (pseudonormalization).  Elevated left atrial pressure.  2. Right ventricular systolic function is mildly reduced. The right  ventricular size is severely enlarged. There is moderately elevated  pulmonary artery systolic pressure. The estimated right ventricular  systolic pressure is 123456 mmHg.  3. Left atrial size was mildly dilated.  4. Right atrial size was mildly dilated.  5. The mitral valve is normal in structure. No evidence of mitral valve  regurgitation.  6. The aortic valve was not well visualized. Aortic valve regurgitation  is  not visualized. No aortic stenosis is present.  7. There is mild dilatation of the ascending aorta, measuring 37 mm.  8. The inferior vena cava is dilated in size with <50% respiratory  variability, suggesting right atrial pressure of 15 mmHg.    Patient Profile     64 y.o. male with a hx of hypertension, tobacco abuse,alcohol use,COPD, OSA, chronic venous insufficieny, nonischemic cardiomyopathyand obesitywho is being seen today for the evaluation of CHF.  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 BNP 1374.  -PTA torsemide40mg  daily - Lasix increased to 80 mg TID - Last echo showed EF 40-45%.  Echo this admission showed EF 40-45% - UOP overnight 1.6L. net -5L - No weight in 2 days. Pt reports dry weight around 237lbs - Unable to afford Entresto in the past. Continue losartan - spiro stopped in the past for unknown reason - Heart rates up however could not tolerate bisoprolol due to fatigue.  - creatinine stable - monitor strict I/os, daily weights, creatinine with diuresis - Still volume up on exam. Continue with diuresis - Patient fells breathing worse today. Patient refused Bipap overnight. Wheezing and crackles on exam.   COPD with ongoing tobacco use -Inhaler and steroids per primary team - still smoking, cessation recommended  Elevated troponin -Mildly elevated with flat trend and not consistent with ACS - No chest pain -Cath [id number] showed normal coronaries  Hypertension -Elevated on admission -Home medsHydralazine 10mg  TID, losartan 100mg  dailywere continued -Patient did not tolerate bisoprolol in the past due to fatigue.  - IV lasix - Pressures reasonable.   Hyperlipidemia -Zetia continued  Diabetes type 2 - A1C6.612/2021 - perPrimary team  OSA on CPAP -Patient is noncompliant with Bipap.  For questions or updates, please contact [place] Please consult [email or web address] for contact info under        Signed, Tawania Daponte Ninfa Meeker, PA-C  12/20/2020, 8:22 AM

## 2020-12-20 NOTE — Consult Note (Signed)
   NAME:  Robert Church, MRN:  [id number], DOB:  Apr 10, 1956, LOS: 3 ADMISSION DATE:  12/17/2020, CONSULTATION DATE:  12/20/2020 REFERRING MD:  Dr Tyson Babinski, CHIEF COMPLAINT: Hypercapnic respiratory failure  Brief History:  Patient came into the hospital complains of dyspnea, leg swelling Was being treated for decompensated heart failure Progressive lethargy ABG revealing hypercapnic respiratory failure Transferred to the ICU for further management on a BiPAP  History of Present Illness:  Underlying history of heart failure Progressive lower extremity swelling with associated shortness of breath  Past Medical History:   Past Medical History:  Diagnosis Date  . Allergy   . Asthma   . Bronchitis   . CHF (congestive heart failure) (HCC)   . COPD (chronic obstructive pulmonary disease) (HCC)   . GERD (gastroesophageal reflux disease)   . Hypertension   . Pneumonia   . Sinusitis   . SOB (shortness of breath)    Significant Hospital Events:  Lethargic, on BiPAP  Consults:  PCCM  Procedures:  none  Significant Diagnostic Tests:  Echocardiogram 12/18/2020 revealing pulmonary hypertension, reduced ejection fraction  Micro Data:  none  Antimicrobials:  Levofloxacin 12/31>>  Interim History / Subjective:  Lethargic, needing BiPAP on at present  Objective   Blood pressure 122/76, pulse 85, temperature 98.2 F (36.8 C), temperature source Oral, resp. rate (!) 27, height 5\' 10"  (1.778 m), weight 115.9 kg, SpO2 92 %.        Intake/Output Summary (Last 24 hours) at 12/20/2020 1702 Last data filed at 12/20/2020 1600 Gross per 24 hour  Intake 480 ml  Output 2725 ml  Net -2245 ml   Filed Weights   12/17/20 1621 12/18/20 0546  Weight: 118.9 kg 115.9 kg   Examination: General: Middle-age gentleman, morbidly obese HENT: Dry oral mucosa Lungs: Decreased air movement bilaterally Cardiovascular: S1-S2 appreciated Abdomen: Soft, bowel sounds appreciated Extremities:  Bilateral edema Neuro: Lethargy GU:   Chest x-ray reviewed by myself showing left midlung atelectasis, no clear-cut extensive infiltrative process, cardiomegaly  Resolved Hospital Problem list     Assessment & Plan:  Acute hypercapnic respiratory failure -Continue BiPAP -Adjust BiPAP based off of ABG -May require intubation if he does not turn around with BiPAP  Underlying obstructive sleep apnea -Noncompliant with CPAP use  Chronic obstructive pulmonary disease -Remains an active smoker  Heart failure with reduced ejection fraction -Continue medications -Cautious diuresis  Type 2 diabetes -SSI  History of anxiety  History of pulmonary hypertension -Likely multifactorial, secondary to heart disease, secondary to chronic hypoxia, secondary to chronic lung disease  Significant risk of deterioration  Continue with BiPAP Follow ABG closely May require intubation  Smoking cessation counseling  Spoke with patient's mother at bedside  The patient is critically ill with multiple organ systems failure and requires high complexity decision making for assessment and support, frequent evaluation and titration of therapies, application of advanced monitoring technologies and extensive interpretation of multiple databases. Critical Care Time devoted to patient care services described in this note independent of APP/resident time (if applicable)  is 32 minutes.   12/20/20 MD [facility] Personal pager: [phone] If unanswered, please page CCM On-call: #[phone]

## 2020-12-20 NOTE — Progress Notes (Signed)
   12/19/20 1251  Assess: MEWS Score  Temp 98.2 F (36.8 C)  BP 124/84  Pulse Rate 75  Resp (!) 34  SpO2 94 %  O2 Device Nasal Cannula  O2 Flow Rate (L/min) 8 L/min  Assess: MEWS Score  MEWS Temp 0  MEWS Systolic 0  MEWS Pulse 0  MEWS RR 2  MEWS LOC 0  MEWS Score 2  MEWS Score Color Yellow  Assess: if the MEWS score is Yellow or Red  Were vital signs taken at a resting state? Yes  Focused Assessment Change from prior assessment (see assessment flowsheet)  Early Detection of Sepsis Score *See Row Information* Low  MEWS guidelines implemented *See Row Information* Yes  Treat  MEWS Interventions Escalated (See documentation below);Consulted Respiratory Therapy  Pain Scale 0-10  Pain Score 0  Take Vital Signs  Increase Vital Sign Frequency  Yellow: Q 2hr X 2 then Q 4hr X 2, if remains yellow, continue Q 4hrs  Escalate  MEWS: Escalate Yellow: discuss with charge nurse/RN and consider discussing with provider and RRT  Notify: Charge Nurse/RN  Name of Charge Nurse/RN Notified Marissa, RN  Date Charge Nurse/RN Notified 12/19/20  Time Charge Nurse/RN Notified 1300  Notify: Provider  Provider Name/Title MD Pokhrel  Date Provider Notified 12/19/20  Time Provider Notified 1300  Notification Type Page  Notification Reason Change in status  Response See new orders  Date of Provider Response 12/19/20  Time of Provider Response 1312  Document  Patient Outcome Stabilized after interventions (BiPAP ordered and placed)  Progress note created (see row info) Yes    STAT ABG ordered and patient placed on BiPAP following critical results. Respiratory called to beside. Will monitor patient.

## 2020-12-21 DIAGNOSIS — I5021 Acute systolic (congestive) heart failure: Secondary | ICD-10-CM | POA: Diagnosis not present

## 2020-12-21 DIAGNOSIS — E118 Type 2 diabetes mellitus with unspecified complications: Secondary | ICD-10-CM | POA: Diagnosis not present

## 2020-12-21 DIAGNOSIS — J9601 Acute respiratory failure with hypoxia: Secondary | ICD-10-CM | POA: Diagnosis not present

## 2020-12-21 DIAGNOSIS — J9602 Acute respiratory failure with hypercapnia: Secondary | ICD-10-CM | POA: Diagnosis not present

## 2020-12-21 DIAGNOSIS — I10 Essential (primary) hypertension: Secondary | ICD-10-CM | POA: Diagnosis not present

## 2020-12-21 DIAGNOSIS — I5041 Acute combined systolic (congestive) and diastolic (congestive) heart failure: Secondary | ICD-10-CM | POA: Diagnosis not present

## 2020-12-21 DIAGNOSIS — D696 Thrombocytopenia, unspecified: Secondary | ICD-10-CM | POA: Diagnosis not present

## 2020-12-21 LAB — COMPREHENSIVE METABOLIC PANEL
ALT: 23 U/L (ref 0–44)
AST: 24 U/L (ref 15–41)
Albumin: 3.3 g/dL — ABNORMAL LOW (ref 3.5–5.0)
Alkaline Phosphatase: 53 U/L (ref 38–126)
Anion gap: 10 (ref 5–15)
BUN: 38 mg/dL — ABNORMAL HIGH (ref 8–23)
CO2: 43 mmol/L — ABNORMAL HIGH (ref 22–32)
Calcium: 8.9 mg/dL (ref 8.9–10.3)
Chloride: 89 mmol/L — ABNORMAL LOW (ref 98–111)
Creatinine, Ser: 0.95 mg/dL (ref 0.61–1.24)
GFR, Estimated: >60 mL/min (ref >60)
Glucose, Bld: 122 mg/dL — ABNORMAL HIGH (ref 70–99)
Potassium: 4.6 mmol/L (ref 3.5–5.1)
Sodium: 142 mmol/L (ref 135–145)
Total Bilirubin: 1 mg/dL (ref 0.3–1.2)
Total Protein: 6.3 g/dL — ABNORMAL LOW (ref 6.5–8.1)

## 2020-12-21 LAB — GLUCOSE, CAPILLARY
Glucose-Capillary: 147 mg/dL — ABNORMAL HIGH (ref 70–99)
Glucose-Capillary: 202 mg/dL — ABNORMAL HIGH (ref 70–99)
Glucose-Capillary: 210 mg/dL — ABNORMAL HIGH (ref 70–99)

## 2020-12-21 LAB — MAGNESIUM: Magnesium: 2.1 mg/dL (ref 1.7–2.4)

## 2020-12-21 LAB — PHOSPHORUS: Phosphorus: 4 mg/dL (ref 2.5–4.6)

## 2020-12-21 MED ORDER — ALBUTEROL SULFATE HFA 108 (90 BASE) MCG/ACT IN AERS
2.0000 | INHALATION_SPRAY | RESPIRATORY_TRACT | Status: DC | PRN
  Administered 2020-12-21 – 2020-12-30 (×8): 2 via RESPIRATORY_TRACT
  Filled 2020-12-21: qty 6.7

## 2020-12-21 NOTE — Progress Notes (Signed)
RT NOTE:  RT attempted to take pt off of BiPAP machine and placed pt on 4L [place]. Pt saturations slowly declined into the lower 80s, RT increased O2 to 6L [place] and pt could not get above 86%. RT placed pt back on BiPAP at this time. RN and MD aware. Vitals stable at this time, RT will continue to monitor.

## 2020-12-21 NOTE — Progress Notes (Signed)
   NAME:  Robert Church, MRN:  [id number], DOB:  05-02-1956, LOS: 4 ADMISSION DATE:  12/17/2020, CONSULTATION DATE:  12/20/2020 REFERRING MD:  Dr Tyson Babinski, CHIEF COMPLAINT: Hypercapnic respiratory failure  Brief History:  Patient came into the hospital complains of dyspnea, leg swelling Was being treated for decompensated heart failure Progressive lethargy ABG revealing hypercapnic respiratory failure Transferred to the ICU for further management on a BiPAP History of Present Illness:  Underlying history of heart failure Progressive lower extremity swelling with associated shortness of breath   Past Medical History:   Past Medical History:  Diagnosis Date  . Allergy   . Asthma   . Bronchitis   . CHF (congestive heart failure) (HCC)   . COPD (chronic obstructive pulmonary disease) (HCC)   . GERD (gastroesophageal reflux disease)   . Hypertension   . Pneumonia   . Sinusitis   . SOB (shortness of breath)    Significant Hospital Events:  On BiPAP 12/20/2020  Consults:  pccm  Procedures:  None  Significant Diagnostic Tests:  Echocardiogram 12/18/2020 revealing pulmonary hypertension, reduced ejection fraction  Micro Data:  None  Antimicrobials:  Levofloxacin 12/31>>  Interim History / Subjective:  More alert and interactive this morning Attempted to take him off BiPAP however saturations in the low 80s  Objective   Blood pressure 120/86, pulse 96, temperature 98.2 F (36.8 C), temperature source Axillary, resp. rate (!) 28, height 5\' 10"  (1.778 m), weight 109.4 kg, SpO2 93 %.    FiO2 (%):  [35 %-50 %] 45 %   Intake/Output Summary (Last 24 hours) at 12/21/2020 1021 Last data filed at 12/21/2020 0900 Gross per 24 hour  Intake 250 ml  Output 4350 ml  Net -4100 ml   Filed Weights   12/17/20 1621 12/18/20 0546 12/21/20 0330  Weight: 118.9 kg 115.9 kg 109.4 kg    Examination: General: Middle-age gentleman, morbidly obese HENT: Dry oral mucosa Lungs:  Decreased air entry bilaterally Cardiovascular: S1-S2 appreciated Abdomen: Soft, bowel sounds appreciated Extremities: Bilateral lower extremity edema Neuro: Lethargy but improving GU:   Resolved Hospital Problem list     Assessment & Plan:  Acute hypercapnic respiratory failure -Continue BiPAP -Adjust BiPAP as needed -Wean off BiPAP as tolerated  Underlying obstructive sleep apnea -Was not compliant with CPAP use -He is open to further treatment at present  Chronic obstructive pulmonary disease -Remains an active smoker -Smoking cessation counseling  Heart failure with reduced ejection fraction -Cautious diuresis -Continue current medications  Type 2 diabetes -SSI  History of anxiety  Multifactorial pulmonary hypertension -Likely related to heart disease, chronic hypoxia, chronic lung disease  Significant risk of deterioration May require ventilator support  ABG appears to be trending better however still hypoxemic  The patient is critically ill with multiple organ systems failure and requires high complexity decision making for assessment and support, frequent evaluation and titration of therapies, application of advanced monitoring technologies and extensive interpretation of multiple databases. Critical Care Time devoted to patient care services described in this note independent of APP/resident time (if applicable)  is 30 minutes.   02/18/21 MD [facility] Personal pager: [phone] If unanswered, please page CCM On-call: #[phone]

## 2020-12-21 NOTE — Progress Notes (Signed)
Progress Note  Patient Name: Robert Church Date of Encounter: 12/21/2020  [facility] Cardiologist: Nanetta Batty, MD   Subjective   Transferred to ICU yesterday for hypercapnic respiratory failure. Treated on BiPAP. O2 desaturations when tried off bipap. Resting comfortably with Bipap on this AM, opens eyes to voice.  Inpatient Medications    Scheduled Meds: . allopurinol  300 mg Oral Daily  . chlorhexidine  15 mL Mouth Rinse BID  . Chlorhexidine Gluconate Cloth  6 each Topical Daily  . enoxaparin (LOVENOX) injection  60 mg Subcutaneous Q24H  . ezetimibe  10 mg Oral Daily  . furosemide  80 mg Intravenous TID PC  . insulin aspart  0-9 Units Subcutaneous TID WC  . losartan  100 mg Oral Daily  . mouth rinse  15 mL Mouth Rinse q12n4p  . methylPREDNISolone (SOLU-MEDROL) injection  40 mg Intravenous Q12H  . mometasone-formoterol  2 puff Inhalation BID  . montelukast  10 mg Oral QHS  . pantoprazole  40 mg Oral BID  . roflumilast  500 mcg Oral Daily  . umeclidinium bromide  1 puff Inhalation Daily   Continuous Infusions: . levofloxacin (LEVAQUIN) IV Stopped (12/20/20 1534)   PRN Meds: acetaminophen **OR** acetaminophen, albuterol, ALPRAZolam, alum & mag hydroxide-simeth, hydrALAZINE, nabumetone, ondansetron **OR** ondansetron (ZOFRAN) IV, traMADol   Vital Signs    Vitals:   12/21/20 0756 12/21/20 0800 12/21/20 0829 12/21/20 0840  BP:    120/86  Pulse: 90 93  96  Resp: 18 16  (!) 28  Temp:  98.2 F (36.8 C)    TempSrc:  Axillary    SpO2: 91% 94% 92% 93%  Weight:      Height:        Intake/Output Summary (Last 24 hours) at 12/21/2020 1000 Last data filed at 12/21/2020 0900 Gross per 24 hour  Intake 250 ml  Output 4350 ml  Net -4100 ml   Last 3 Weights 12/21/2020 12/18/2020 12/17/2020  Weight (lbs) 241 lb 1.6 oz 255 lb 8 oz 262 lb 3.2 oz  Weight (kg) 109.362 kg 115.894 kg 118.933 kg      Telemetry    Sinus with rare PVCs - Personally Reviewed  ECG     No new since 12/28 - Personally Reviewed  Physical Exam   GEN: Comfortable, wearing bipap Neck: No JVD visible but difficult body habitus Cardiac: RRR, no murmurs, rubs, or gallops.  Respiratory: coarse but less wheezing GI: Soft, nontender, non-distended  MS: bilateral diffuse LE brawny edema with some improvement, chronic skin discoloration Neuro:  Nonfocal  Psych: no distress  Labs    High Sensitivity Troponin:   Recent Labs  Lab 12/17/20 1134 12/17/20 1305  TROPONINIHS 26* 27*      Chemistry Recent Labs  Lab 12/19/20 0519 12/20/20 0529 12/21/20 0557  NA 139 140 142  K 4.0 4.5 4.6  CL 93* 90* 89*  CO2 34* 38* 43*  GLUCOSE 126* 131* 122*  BUN 27* 36* 38*  CREATININE 1.12 1.02 0.95  CALCIUM 8.8* 8.7* 8.9  PROT 6.8 6.9 6.3*  ALBUMIN 3.6 3.6 3.3*  AST 27 28 24   ALT 23 24 23   ALKPHOS 70 62 53  BILITOT 1.2 0.7 1.0  GFRNONAA >60 >60 >60  ANIONGAP 12 12 10      Hematology Recent Labs  Lab 12/18/20 0459 12/19/20 0519 12/20/20 0529  WBC 12.1* 10.6* 8.7  RBC 5.22 5.09 5.25  HGB 17.2* 16.7 17.1*  HCT 54.4* 54.0* 55.5*  MCV 104.2* 106.1*  105.7*  MCH 33.0 32.8 32.6  MCHC 31.6 30.9 30.8  RDW 13.2 13.1 12.6  PLT 142* 129* 140*    BNP Recent Labs  Lab 12/17/20 1134  BNP 1,374.9*     DDimer  Recent Labs  Lab 12/19/20 0954  DDIMER 1.98*     Radiology    No results found.  Cardiac Studies   Echo 12/18/20 1. Left ventricular ejection fraction, by estimation, is 40 to 45%. The  left ventricle has mildly decreased function. The left ventricle  demonstrates global hypokinesis. The left ventricular internal cavity size  was moderately dilated. There is mild  left ventricular hypertrophy. Left ventricular diastolic parameters are  consistent with Grade II diastolic dysfunction (pseudonormalization).  Elevated left atrial pressure.  2. Right ventricular systolic function is mildly reduced. The right  ventricular size is severely enlarged. There  is moderately elevated  pulmonary artery systolic pressure. The estimated right ventricular  systolic pressure is 123456 mmHg.  3. Left atrial size was mildly dilated.  4. Right atrial size was mildly dilated.  5. The mitral valve is normal in structure. No evidence of mitral valve  regurgitation.  6. The aortic valve was not well visualized. Aortic valve regurgitation  is not visualized. No aortic stenosis is present.  7. There is mild dilatation of the ascending aorta, measuring 37 mm.  8. The inferior vena cava is dilated in size with <50% respiratory  variability, suggesting right atrial pressure of 15 mmHg.   Patient Profile     65 y.o. male with PMH chronic systolic and diastolic heart failure, nonischemic cardiomyopathy, COPD, tobacco abuse, alcohol abuse, OSA noncompliant with CPAP, chronic venous insufficiency who is being followed in consultation for the management of heart failure.  Assessment & Plan    Acute hypercapnic respiratory failure, with hypoxia -management per primary team and CCM -risk factors of COPD/tobacco use -pulmonary hypertension noted on echo -on BiPAP, steroids, dulera, singulair, incruse, and roflumilast  OSA: now on bipap, noncompliant with CPAP at home  Acute on chronic systolic and diastolic heart failure Diffuse edema -complicated by pulmonary hypertension, chronic venous insufficiency -has been followed by Dr. Haroldine Laws, did not tolerate beta blocker or entresto in the past -for now, continue losartan, hydralazine. Suspect that TID medication will be difficult for him to adhere to at discharge -weight on admission 118.9 kg, weight today 109.4 kg. Net negative about 8L, though I/O incompletely measured. -continue with IV lasix 80 mg TID, Cr stable to improved at 0.95 today. K 4.6.  Type II diabetes: -with heart failure, would benefit from SGLT2i at discharge. Need to make sure this is affordable for him.  For questions or updates, please  contact [place] Please consult [email or web address] for contact info under        Signed, Buford Dresser, MD  12/21/2020, 10:00 AM

## 2020-12-21 NOTE — Progress Notes (Signed)
PROGRESS NOTE  Robert Church [id number] DOB: [birth year] DOA: 12/17/2020 PCP: Janith Lima, MD   LOS: 4 days   Brief narrative: As per HPI,  Robert Church is a 65 y.o. male with medical history significant of CHF, COPD, HTN  presented to hospital with bilateral leg swelling and abdominal distention.  Patient stated compliance with his heart failure medication but despite that he continued to have increased swelling.  Of note, the patient had seen his primary care physician who had plans to obtain CT abdomen of his chest abdomen and pelvis as outpatient.  In the ED, patient was noted to have there is edema extending up to the abdomen with BNP of 1300+. CXR showed some pulmonary congestion. He was given IV lasix and was considered for admission to the hospital for acute decompensated heart failure.  Assessment/Plan:  Principal Problem:   Acute CHF (congestive heart failure) (HCC) Active Problems:   HTN (hypertension)   Type II diabetes mellitus with manifestations (HCC)   Thrombocytopenia (HCC)  Acute on chronic combined HF Extensive peripheral edema on presentation.  On IV Lasix 80 mg TID per cardiology recommendations.  Has been having good diuresis with a net negative balance of more than 10 L.  Continue nebulizers, BiPAP as tolerated.  2D echocardiogram from 12/18/2020 showed LV ejection fraction of 40 to 45% with global hypokinesis of the left ventricular wall and diastolic dysfunction grade 2. Continue daily weights.  Strict intake and output charting.  Fluid restriction 1200 mL/day.  Infection decreasing peripheral edema noted.  Creatinine stable with diuresis.  Potassium of 4.6.  Acute hypoxic and hypercapnic respiratory failure multifactorial secondary to CHF, COPD, OSA with underlying obesity. on BiPAP.  Continue IV diuresis, steroids, nebulizers and inhalers.  Pulmonary on board.  COPD with acute exacerbation. Not on oxygen at baseline.  Continue nebulizers BiPAP.   Pulmonary on board.  Continue IV steroids.  Daleen Bo has been started on Levaquin since allergy to other antibiotics   History of sleep apnea.  Currently on BiPAP.  Noncompliant with CPAP at home.  Elevated troponins Elevated but flat troponin.  No chest pain.  Unlikely to be ACS.   Thrombocytopenia Mild.  No evidence of bleeding.  Latest platelet count of 140  HTN On Cozaar, IV Lasix.   Follow blood pressure closely.  Anxiety    Continue Xanax as needed.  Hyperlipidemia     - continue zetia  Diabetes mellitus type 2. Hemoglobin A1c 6.6 (11/28/20).  Continue to hold Metformin.  Continue sliding scale insulin, Accu-Cheks diabetic diet.  Latest POC glucose of 202.  Could benefit from SGLT 2 inhibitors on discharge  Goals of care full code at this time.    DVT prophylaxis: Lovenox subcu  Code Status: Full code  Family Communication:  I again spoke with the patient's mother at bedside   Status is: Inpatient  Remains inpatient appropriate because:IV treatments appropriate due to intensity of illness or inability to take PO and Inpatient level of care appropriate due to severity of illness, decompensated heart failure requiring escalating dose of IV diuresis, metabolic encephalopathy, COPD exacerbation and hypercarbia requiring BiPAP,   Dispo: The patient is from: Home              Anticipated d/c is to: Home with home health              Anticipated d/c date is: 2-3 days              Patient currently  is not medically stable to d/c.  Consultants:  Cardiology  Pulmonary.  Procedures:  BiPAP  Antibiotics:  . Levaquin  Anti-infectives (From admission, onward)   Start     Dose/Rate Route Frequency Ordered Stop   12/20/20 1500  levofloxacin (LEVAQUIN) IVPB 500 mg        500 mg 100 mL/hr over 60 Minutes Intravenous Every 24 hours 12/20/20 1340       Subjective: Today, patient was seen and examined at bedside.  Patient's mother at bedside as well.  Patient states  that he wishes to drink something.  Has been using BiPAP.  Significant reduction in lower extremity edema with good diuresis.  Denies pain, nausea, vomiting, fever or chills.  Objective: Vitals:   12/21/20 0500 12/21/20 0600  BP: 109/63 121/71  Pulse: 89 99  Resp: 18   Temp:    SpO2: (!) 88% 94%    Intake/Output Summary (Last 24 hours) at 12/21/2020 0733 Last data filed at 12/21/2020 0600 Gross per 24 hour  Intake 250 ml  Output 3450 ml  Net -3200 ml   Filed Weights   12/17/20 1621 12/18/20 0546 12/21/20 0330  Weight: 118.9 kg 115.9 kg 109.4 kg   Body mass index is 34.59 kg/m.   Physical Exam: GENERAL: Patient is alert awake and communicative on BiPAP HENT: No scleral pallor or icterus. Pupils equally reactive to light. Oral mucosa is moist NECK: is supple, no gross swelling noted. CHEST:  Coarse breath sounds noted. CVS: S1 and S2 heard, no murmur. Regular rate and rhythm.  ABDOMEN: Soft, non-tender, bowel sounds are present, distended abdomen EXTREMITIES: Bilateral lower extremity edema extending up to the lower abdomen with evidence of chronic venous insufficiency. CNS: Cranial nerves are intact. No focal motor deficits. SKIN: warm and dry without rashes.  Data Review: I have personally reviewed the following laboratory data and studies,  CBC: Recent Labs  Lab 12/17/20 1134 12/18/20 0459 12/19/20 0519 12/20/20 0529  WBC 9.4 12.1* 10.6* 8.7  HGB 17.1* 17.2* 16.7 17.1*  HCT 52.1* 54.4* 54.0* 55.5*  MCV 100.8* 104.2* 106.1* 105.7*  PLT 135* 142* 129* [id number]*   Basic Metabolic Panel: Recent Labs  Lab 12/17/20 1134 12/17/20 1305 12/18/20 0459 12/19/20 0519 12/20/20 0529 12/21/20 0557  NA 140  --  140 139 140 142  K 4.0  --  4.0 4.0 4.5 4.6  CL 99  --  97* 93* 90* 89*  CO2 30  --  31 34* 38* 43*  GLUCOSE 116*  --  106* 126* 131* 122*  BUN 12  --  17 27* 36* 38*  CREATININE 0.84  --  1.01 1.12 1.02 0.95  CALCIUM 8.9  --  8.6* 8.8* 8.7* 8.9  MG  --  1.8  --   1.8 1.9 2.1  PHOS  --   --   --  4.0 3.8 4.0   Liver Function Tests: Recent Labs  Lab 12/17/20 1305 12/18/20 0459 12/19/20 0519 12/20/20 0529 12/21/20 0557  AST 30 24 27 28 24   ALT 24 23 23 24 23   ALKPHOS 73 73 70 62 53  BILITOT 1.5* 1.7* 1.2 0.7 1.0  PROT 7.0 6.9 6.8 6.9 6.3*  ALBUMIN 3.8 3.8 3.6 3.6 3.3*   No results for input(s): LIPASE, AMYLASE in the last 168 hours. No results for input(s): AMMONIA in the last 168 hours. Cardiac Enzymes: No results for input(s): CKTOTAL, CKMB, CKMBINDEX, TROPONINI in the last 168 hours. BNP (last 3 results) Recent Labs  01/11/20 1444 12/17/20 1134  BNP 267.3* 1,374.9*    ProBNP (last 3 results) No results for input(s): PROBNP in the last 8760 hours.  CBG: Recent Labs  Lab 12/19/20 1216 12/19/20 1634 12/19/20 2119 12/20/20 0724 12/20/20 1133  GLUCAP 164* 109* 157* 157* 164*   Recent Results (from the past 240 hour(s))  Resp Panel by RT-PCR (Flu A&B, Covid) Nasopharyngeal Swab     Status: None   Collection Time: 12/17/20 11:34 AM   Specimen: Nasopharyngeal Swab; Nasopharyngeal(NP) swabs in vial transport medium  Result Value Ref Range Status   SARS Coronavirus 2 by RT PCR NEGATIVE NEGATIVE Final    Comment: (NOTE) SARS-CoV-2 target nucleic acids are NOT DETECTED.  The SARS-CoV-2 RNA is generally detectable in upper respiratory specimens during the acute phase of infection. The lowest concentration of SARS-CoV-2 viral copies this assay can detect is 138 copies/mL. A negative result does not preclude SARS-Cov-2 infection and should not be used as the sole basis for treatment or other patient management decisions. A negative result may occur with  improper specimen collection/handling, submission of specimen other than nasopharyngeal swab, presence of viral mutation(s) within the areas targeted by this assay, and inadequate number of viral copies(<138 copies/mL). A negative result must be combined with clinical  observations, patient history, and epidemiological information. The expected result is Negative.  Fact Sheet for Patients:  BloggerCourse.com  Fact Sheet for Healthcare Providers:  SeriousBroker.it  This test is no t yet approved or cleared by the [place] FDA and  has been authorized for detection and/or diagnosis of SARS-CoV-2 by FDA under an Emergency Use Authorization (EUA). This EUA will remain  in effect (meaning this test can be used) for the duration of the COVID-19 declaration under Section 564(b)(1) of the Act, 21 U.S.C.section 360bbb-3(b)(1), unless the authorization is terminated  or revoked sooner.       Influenza A by PCR NEGATIVE NEGATIVE Final   Influenza B by PCR NEGATIVE NEGATIVE Final    Comment: (NOTE) The Xpert Xpress SARS-CoV-2/FLU/RSV plus assay is intended as an aid in the diagnosis of influenza from Nasopharyngeal swab specimens and should not be used as a sole basis for treatment. Nasal washings and aspirates are unacceptable for Xpert Xpress SARS-CoV-2/FLU/RSV testing.  Fact Sheet for Patients: BloggerCourse.com  Fact Sheet for Healthcare Providers: SeriousBroker.it  This test is not yet approved or cleared by the [place] FDA and has been authorized for detection and/or diagnosis of SARS-CoV-2 by FDA under an Emergency Use Authorization (EUA). This EUA will remain in effect (meaning this test can be used) for the duration of the COVID-19 declaration under Section 564(b)(1) of the Act, 21 U.S.C. section 360bbb-3(b)(1), unless the authorization is terminated or revoked.  Performed at [facility], [address], [place], [place] [address]      Studies: No results found.    Joycelyn Das, MD  [facility] 12/21/2020  If 7PM-7AM, please contact night-coverage

## 2020-12-22 DIAGNOSIS — I5041 Acute combined systolic (congestive) and diastolic (congestive) heart failure: Secondary | ICD-10-CM | POA: Diagnosis not present

## 2020-12-22 DIAGNOSIS — J9601 Acute respiratory failure with hypoxia: Secondary | ICD-10-CM | POA: Diagnosis not present

## 2020-12-22 DIAGNOSIS — E118 Type 2 diabetes mellitus with unspecified complications: Secondary | ICD-10-CM | POA: Diagnosis not present

## 2020-12-22 DIAGNOSIS — J9602 Acute respiratory failure with hypercapnia: Secondary | ICD-10-CM | POA: Diagnosis not present

## 2020-12-22 DIAGNOSIS — D696 Thrombocytopenia, unspecified: Secondary | ICD-10-CM | POA: Diagnosis not present

## 2020-12-22 DIAGNOSIS — I5021 Acute systolic (congestive) heart failure: Secondary | ICD-10-CM | POA: Diagnosis not present

## 2020-12-22 DIAGNOSIS — I10 Essential (primary) hypertension: Secondary | ICD-10-CM | POA: Diagnosis not present

## 2020-12-22 LAB — BASIC METABOLIC PANEL
Anion gap: 15 (ref 5–15)
BUN: 38 mg/dL — ABNORMAL HIGH (ref 8–23)
CO2: 45 mmol/L — ABNORMAL HIGH (ref 22–32)
Calcium: 9.2 mg/dL (ref 8.9–10.3)
Chloride: 83 mmol/L — ABNORMAL LOW (ref 98–111)
Creatinine, Ser: 0.94 mg/dL (ref 0.61–1.24)
GFR, Estimated: >60 mL/min (ref >60)
Glucose, Bld: 117 mg/dL — ABNORMAL HIGH (ref 70–99)
Potassium: 4 mmol/L (ref 3.5–5.1)
Sodium: 143 mmol/L (ref 135–145)

## 2020-12-22 LAB — CBC
HCT: 56 % — ABNORMAL HIGH (ref 39.0–52.0)
Hemoglobin: 17.3 g/dL — ABNORMAL HIGH (ref 13.0–17.0)
MCH: 32.3 pg (ref 26.0–34.0)
MCHC: 30.9 g/dL (ref 30.0–36.0)
MCV: 104.5 fL — ABNORMAL HIGH (ref 80.0–100.0)
Platelets: 136 10*3/uL — ABNORMAL LOW (ref 150–400)
RBC: 5.36 MIL/uL (ref 4.22–5.81)
RDW: 12.1 % (ref 11.5–15.5)
WBC: 7.3 10*3/uL (ref 4.0–10.5)
nRBC: 0 % (ref 0.0–0.2)

## 2020-12-22 LAB — MRSA PCR SCREENING: MRSA by PCR: NEGATIVE

## 2020-12-22 LAB — GLUCOSE, CAPILLARY
Glucose-Capillary: 158 mg/dL — ABNORMAL HIGH (ref 70–99)
Glucose-Capillary: 251 mg/dL — ABNORMAL HIGH (ref 70–99)
Glucose-Capillary: 166 mg/dL — ABNORMAL HIGH (ref 70–99)
Glucose-Capillary: 179 mg/dL — ABNORMAL HIGH (ref 70–99)

## 2020-12-22 LAB — MAGNESIUM: Magnesium: 2.2 mg/dL (ref 1.7–2.4)

## 2020-12-22 LAB — PHOSPHORUS: Phosphorus: 3 mg/dL (ref 2.5–4.6)

## 2020-12-22 MED ORDER — HYDRALAZINE HCL 10 MG PO TABS
10.0000 mg | ORAL_TABLET | Freq: Three times a day (TID) | ORAL | Status: AC
  Administered 2020-12-22 – 2020-12-24 (×6): 10 mg via ORAL
  Filled 2020-12-22 (×9): qty 1

## 2020-12-22 MED ORDER — NICOTINE 21 MG/24HR TD PT24
21.0000 mg | MEDICATED_PATCH | Freq: Every day | TRANSDERMAL | Status: DC
  Administered 2020-12-22 – 2020-12-30 (×8): 21 mg via TRANSDERMAL
  Filled 2020-12-22 (×9): qty 1

## 2020-12-22 MED ORDER — LEVOFLOXACIN 500 MG PO TABS
500.0000 mg | ORAL_TABLET | Freq: Every day | ORAL | Status: AC
  Administered 2020-12-22 – 2020-12-24 (×3): 500 mg via ORAL
  Filled 2020-12-22 (×3): qty 1

## 2020-12-22 MED ORDER — LORAZEPAM 1 MG PO TABS
1.0000 mg | ORAL_TABLET | Freq: Four times a day (QID) | ORAL | Status: DC | PRN
  Administered 2020-12-22 – 2020-12-29 (×9): 1 mg via ORAL
  Filled 2020-12-22 (×9): qty 1

## 2020-12-22 NOTE — Progress Notes (Signed)
   NAME:  Robert Church, MRN:  [id number], DOB:  May 31, 1956, LOS: 5 ADMISSION DATE:  12/17/2020, CONSULTATION DATE:  12/20/2020 REFERRING MD:  Dr Tyson Babinski, CHIEF COMPLAINT: Hypercapnic respiratory failure  Brief History:  Patient came into the hospital complains of dyspnea, leg swelling Was being treated for decompensated heart failure Progressive lethargy ABG revealing hypercapnic respiratory failure Transferred to the ICU for further management on a BiPAP  History of Present Illness:  Underlying history of heart failure Progressive lower extremity swelling with associated shortness of breath  Past Medical History:   Past Medical History:  Diagnosis Date  . Allergy   . Asthma   . Bronchitis   . CHF (congestive heart failure) (HCC)   . COPD (chronic obstructive pulmonary disease) (HCC)   . GERD (gastroesophageal reflux disease)   . Hypertension   . Pneumonia   . Sinusitis   . SOB (shortness of breath)    Significant Hospital Events:  On BiPAP 12/20/2020  Consults:  pccm  Procedures:  None  Significant Diagnostic Tests:  Echocardiogram 12/18/2020 revealing pulmonary hypertension, reduced ejection fraction  Micro Data:  None  Antimicrobials:  Levofloxacin 12/31>>  Interim History / Subjective:  More alert and interactive this morning Was able to be off BiPAP  Objective   Blood pressure 130/80, pulse 79, temperature 98.2 F (36.8 C), temperature source Axillary, resp. rate 18, height 5\' 10"  (1.778 m), weight 103.3 kg, SpO2 92 %.    FiO2 (%):  [40 %] 40 %   Intake/Output Summary (Last 24 hours) at 12/22/2020 1410 Last data filed at 12/22/2020 1200 Gross per 24 hour  Intake 1540 ml  Output 4550 ml  Net -3010 ml   Filed Weights   12/18/20 0546 12/21/20 0330 12/22/20 0800  Weight: 115.9 kg 109.4 kg 103.3 kg    Examination: General: Middle-age gentleman, morbidly obese HENT: Dry oral mucosa Lungs: Decreased air entry bilaterally Cardiovascular: S1-S2  appreciated Abdomen: Soft, bowel sounds appreciated  Resolved Hospital Problem list     Assessment & Plan:  Acute hypercapnic respiratory failure -Was able to wean off BiPAP this morning -Continue oxygen supplementation -Encourage BiPAP use at night  Patient with underlying obstructive sleep apnea Was not compliant with CPAP use -Open to further treatment -We will need a sleep study to titrate to BiPAP as outpatient  Chronic obstructive pulmonary disease -Remains an active smoker -Smoking cessation counseling  Heart failure with reduced ejection fraction -Cautious diuresis -Continue current medications  Type 2 diabetes -SSI  Pulmonary hypertension -Multifactorial including chronic hypoxia, chronic lung disease, heart disease  ABG trending better Still has risk of decompensation  02/19/21, MD [place] [facility] Pager: [phone]  Consider transition to MedSurg floor if he continues to be stable

## 2020-12-22 NOTE — Progress Notes (Signed)
RT NOTE:  RT took pt off BiPAP and placed on 6L [place]. Pt tolerating well at this time with saturations of 94-96%. Vitals stable at this time, RT will continue to monitor.

## 2020-12-22 NOTE — Progress Notes (Signed)
PHARMACIST - PHYSICIAN COMMUNICATION DR:   Tyson Babinski CONCERNING: Antibiotic IV to Oral Route Change Policy  RECOMMENDATION: This patient is receiving Levaquin by the intravenous route.  Based on criteria approved by the Pharmacy and Therapeutics Committee, the antibiotic(s) is/are being converted to the equivalent oral dose form(s).  Otho Bellows PharmD 12/22/2020, 11:28 AM    DESCRIPTION: These criteria include:  Patient being treated for a respiratory tract infection, urinary tract infection, cellulitis or clostridium difficile associated diarrhea if on metronidazole  The patient is not neutropenic and does not exhibit a GI malabsorption state  The patient is eating (either orally or via tube) and/or has been taking other orally administered medications for a least 24 hours  The patient is improving clinically and has a Tmax < 100.5  If you have questions about this conversion, please contact the Pharmacy Department  []   [phone] )  ( [phone] []   [phone] )  [facility] []   [phone] )  [facility] []   [phone] )  [facility] [x]   [phone] )  [facility]

## 2020-12-22 NOTE — Progress Notes (Signed)
Progress Note  Patient Name: Robert Church Date of Encounter: 12/22/2020  [facility] Cardiologist: Quay Burow, MD   Subjective   Sitting up in chair this AM, conversant. He is feeling improved, asking when he can go home. Reviewed plan with him. He does not recall what reaction he had to entresto, thinks maybe it made his legs hurt. Denies chest pain.  Inpatient Medications    Scheduled Meds: . allopurinol  300 mg Oral Daily  . chlorhexidine  15 mL Mouth Rinse BID  . Chlorhexidine Gluconate Cloth  6 each Topical Daily  . enoxaparin (LOVENOX) injection  60 mg Subcutaneous Q24H  . ezetimibe  10 mg Oral Daily  . furosemide  80 mg Intravenous TID PC  . hydrALAZINE  10 mg Oral TID  . insulin aspart  0-9 Units Subcutaneous TID WC  . losartan  100 mg Oral Daily  . mouth rinse  15 mL Mouth Rinse q12n4p  . methylPREDNISolone (SOLU-MEDROL) injection  40 mg Intravenous Q12H  . mometasone-formoterol  2 puff Inhalation BID  . montelukast  10 mg Oral QHS  . pantoprazole  40 mg Oral BID  . roflumilast  500 mcg Oral Daily  . umeclidinium bromide  1 puff Inhalation Daily   Continuous Infusions: . levofloxacin (LEVAQUIN) IV Stopped (12/21/20 1532)   PRN Meds: acetaminophen **OR** acetaminophen, albuterol, ALPRAZolam, alum & mag hydroxide-simeth, nabumetone, ondansetron **OR** ondansetron (ZOFRAN) IV, traMADol   Vital Signs    Vitals:   12/22/20 0745 12/22/20 0800 12/22/20 0807 12/22/20 0808  BP:  (!) 176/98    Pulse: 86 85    Resp:  (!) 21    Temp:      TempSrc:      SpO2: 95% 93% 96% 96%  Weight:  103.3 kg    Height:        Intake/Output Summary (Last 24 hours) at 12/22/2020 0934 Last data filed at 12/22/2020 0500 Gross per 24 hour  Intake 990 ml  Output 3500 ml  Net -2510 ml   Last 3 Weights 12/22/2020 12/21/2020 12/18/2020  Weight (lbs) 227 lb 11.2 oz 241 lb 1.6 oz 255 lb 8 oz  Weight (kg) 103.284 kg 109.362 kg 115.894 kg      Telemetry    Sinus with rare  PVCs - Personally Reviewed  ECG    No new since 12/28 - Personally Reviewed  Physical Exam   GEN: Well nourished, well developed in no acute distress. Union in place, satting ~88% NECK: No JVD appreciated but difficult body habitus CARDIAC: regular rhythm, normal S1 and S2, no rubs or gallops. No murmur. VASCULAR: Radial pulses 2+ bilaterally.  RESPIRATORY:  Coarse but continues to improve, no wheezing appreciated today ABDOMEN: Soft, non-tender, less distended than on admission MUSCULOSKELETAL:  Moves all 4 limbs independently SKIN: Warm and dry, bilateral diffuse LE brawny edema, improving, less firm with start of skin wrinkling. NEUROLOGIC:  No focal neuro deficits noted. PSYCHIATRIC:  Normal affect   Labs    High Sensitivity Troponin:   Recent Labs  Lab 12/17/20 1134 12/17/20 1305  TROPONINIHS 26* 27*      Chemistry Recent Labs  Lab 12/19/20 0519 12/20/20 0529 12/21/20 0557 12/22/20 0245  NA 139 140 142 143  K 4.0 4.5 4.6 4.0  CL 93* 90* 89* 83*  CO2 34* 38* 43* 45*  GLUCOSE 126* 131* 122* 117*  BUN 27* 36* 38* 38*  CREATININE 1.12 1.02 0.95 0.94  CALCIUM 8.8* 8.7* 8.9 9.2  PROT 6.8  6.9 6.3*  --   ALBUMIN 3.6 3.6 3.3*  --   AST 27 28 24   --   ALT 23 24 23   --   ALKPHOS 70 62 53  --   BILITOT 1.2 0.7 1.0  --   GFRNONAA >60 >60 >60 >60  ANIONGAP 12 12 10 15      Hematology Recent Labs  Lab 12/19/20 0519 12/20/20 0529 12/22/20 0245  WBC 10.6* 8.7 7.3  RBC 5.09 5.25 5.36  HGB 16.7 17.1* 17.3*  HCT 54.0* 55.5* 56.0*  MCV 106.1* 105.7* 104.5*  MCH 32.8 32.6 32.3  MCHC 30.9 30.8 30.9  RDW 13.1 12.6 12.1  PLT 129* 140* 136*    BNP Recent Labs  Lab 12/17/20 1134  BNP 1,374.9*     DDimer  Recent Labs  Lab 12/19/20 0954  DDIMER 1.98*     Radiology    No results found.  Cardiac Studies   Echo 12/18/20 1. Left ventricular ejection fraction, by estimation, is 40 to 45%. The  left ventricle has mildly decreased function. The left  ventricle  demonstrates global hypokinesis. The left ventricular internal cavity size  was moderately dilated. There is mild  left ventricular hypertrophy. Left ventricular diastolic parameters are  consistent with Grade II diastolic dysfunction (pseudonormalization).  Elevated left atrial pressure.  2. Right ventricular systolic function is mildly reduced. The right  ventricular size is severely enlarged. There is moderately elevated  pulmonary artery systolic pressure. The estimated right ventricular  systolic pressure is 49.1 mmHg.  3. Left atrial size was mildly dilated.  4. Right atrial size was mildly dilated.  5. The mitral valve is normal in structure. No evidence of mitral valve  regurgitation.  6. The aortic valve was not well visualized. Aortic valve regurgitation  is not visualized. No aortic stenosis is present.  7. There is mild dilatation of the ascending aorta, measuring 37 mm.  8. The inferior vena cava is dilated in size with <50% respiratory  variability, suggesting right atrial pressure of 15 mmHg.   Patient Profile     65 y.o. male with PMH chronic systolic and diastolic heart failure, nonischemic cardiomyopathy, COPD, tobacco abuse, alcohol abuse, OSA noncompliant with CPAP, chronic venous insufficiency who is being followed in consultation for the management of heart failure.  Assessment & Plan    Acute hypercapnic respiratory failure, with hypoxia -management per primary team and CCM -risk factors of COPD/tobacco use -pulmonary hypertension noted on echo -has required BiPAP, steroids, dulera, singulair, incruse, and roflumilast -ABG/CO2 being followed by CCM. Could consider dose of acetazolamide if needed for alkalosis, but remains on normal to slightly acidotic spectrum currently.  OSA: required bipap this admission, noncompliant with CPAP at home. He has a machine but needs it calibrated.  Acute on chronic systolic and diastolic heart  failure Diffuse edema -complicated by pulmonary hypertension, chronic venous insufficiency -has been followed by Dr. 12/21/20, did not tolerate beta blocker or entresto in the past -on losartan 100 mg. If BP room remains, could consider re-challenge with entresto this admission. Need to make sure it is affordable for him at discharge. -blood pressures have risen over the last 36 hours. On review, appears that hydralazine has been PRN given prior low blood pressures. Will change to standing 10 mg TID and monitor pressures. Suspect that TID medication will be difficult for him to adhere to at discharge, but will continue for now -weight on admission 118.9 kg, weight today 103.3 kg. Net negative about 12L, though  I/O incompletely measured. We discussed that though his fluid status is greatly improved, we still have a way to go. He is anxious to go home but understands need to diurese in the hospital. -continue with IV lasix 80 mg TID, Cr stable to improved at 0.94 today. K 4.0.  Type II diabetes: -with heart failure, would benefit from SGLT2i at discharge. Need to make sure this is affordable for him.  For questions or updates, please contact [place] Please consult [email or web address] for contact info under        Signed, Buford Dresser, MD  12/22/2020, 9:34 AM

## 2020-12-22 NOTE — Progress Notes (Addendum)
PROGRESS NOTE  Robert Church WUJ:[id number] DOB: 1956-06-08 DOA: 12/17/2020 PCP: [name], MD   LOS: 5 days   Brief narrative: As per HPI,  Robert Church is a 65 y.o. male with medical history significant of CHF, COPD, HTN  presented to hospital with bilateral leg swelling and abdominal distention.  Patient stated compliance with his heart failure medication but despite that he continued to have increased swelling.  Of note, the patient had seen his primary care physician who had plans to obtain CT abdomen of his chest abdomen and pelvis as outpatient.  In the ED, patient was noted to have there is edema extending up to the abdomen with BNP of 1300+. CXR showed some pulmonary congestion. He was given IV lasix and was considered for admission to the hospital for acute decompensated heart failure.  Assessment/Plan:  Principal Problem:   Acute CHF (congestive heart failure) (HCC) Active Problems:   HTN (hypertension)   Type II diabetes mellitus with manifestations (HCC)   Thrombocytopenia (HCC)  Acute on chronic combined HF Extensive peripheral edema on presentation.  On IV Lasix 80 mg TID as per cardiology recommendations.  Has been having good diuresis with a net negative balance of more than 11 L.  Continue nebulizers, BiPAP.  2D echocardiogram from 12/18/2020 showed LV ejection fraction of 40 to 45% with global hypokinesis of the left ventricular wall and diastolic dysfunction grade 2. Continue daily weights.  Strict intake and output charting.  Continue Fluid restriction 1200 mL/day. Diuresing well with decreasing peripheral edema noted.  Creatinine stable with diuresis.  Potassium of 4.0.  Acute hypoxic and hypercapnic respiratory failure multifactorial secondary to CHF, COPD, OSA with underlying obesity. on BiPAP.  Continue IV diuresis, steroids, nebulizers and inhalers.  Pulmonary on board. Will  benefit from Trilogy at home to prevent from recurrent decompensation,  improve compliance, decreased recurrent admissions to the hospital.  COPD with acute exacerbation. Not on oxygen at baseline.  Continue nebulizers, inhalers including dulera, daliresp, BiPAP.  Pulmonary on board.  Continue IV steroids.  on Levaquin since allergy to other antibiotics   History of sleep apnea.  Currently on BiPAP.  Noncompliant with CPAP at home.  Elevated troponins Elevated but flat troponin.  No chest pain.  Unlikely to be ACS.   Thrombocytopenia Mild.  No evidence of bleeding.  Latest platelet count of 136  HTN On Cozaar, IV Lasix.   Follow blood pressure closely.  Anxiety    Continue Xanax as needed.  Hyperlipidemia     - continue zetia  Diabetes mellitus type 2. Hemoglobin A1c 6.6 (11/28/20).  Continue to hold Metformin.  Continue sliding scale insulin, Accu-Cheks, diabetic diet.   Could benefit from SGLT 2 inhibitors on discharge  Goals of care full code at this time.    DVT prophylaxis: Lovenox subcu  Code Status: Full code  Family Communication:  I  again spoke with the patient's mother at bedside today.  Status is: Inpatient  Remains inpatient appropriate because:IV treatments appropriate due to intensity of illness or inability to take PO and Inpatient level of care appropriate due to severity of illness, decompensated heart failure requiring escalating dose of IV diuresis, metabolic encephalopathy, COPD exacerbation and hypercarbia requiring BiPAP,   Dispo: The patient is from: Home              Anticipated d/c is to: Home with home health              Anticipated d/c date is:  2-3 days              Patient currently is not medically stable to d/c.  Consultants:  Cardiology  Pulmonary.  Procedures:  BiPAP  Antibiotics:  . Levaquin  Anti-infectives (From admission, onward)   Start     Dose/Rate Route Frequency Ordered Stop   12/20/20 1500  levofloxacin (LEVAQUIN) IVPB 500 mg        500 mg 100 mL/hr over 60 Minutes Intravenous  Every 24 hours 12/20/20 1340       Subjective: Today, patient was seen and examined at bedside.  Patient was eating his meal.  Appears to be much more alert awake.  He however desaturates off BiPAP.  Patient's mother at bedside.   Objective: Vitals:   12/22/20 0500 12/22/20 0600  BP: (!) 156/111 (!) 161/103  Pulse: 95   Resp: (!) 29 20  Temp: (!) 97.5 F (36.4 C)   SpO2: 91%     Intake/Output Summary (Last 24 hours) at 12/22/2020 0722 Last data filed at 12/22/2020 0500 Gross per 24 hour  Intake 990 ml  Output 4400 ml  Net -3410 ml   Filed Weights   12/17/20 1621 12/18/20 0546 12/21/20 0330  Weight: 118.9 kg 115.9 kg 109.4 kg   Body mass index is 34.59 kg/m.   Physical Exam:  General: Obese, alert awake, communicative,, alert awake, on nasal cannula HENT:   No scleral pallor or icterus noted. Oral mucosa is moist.  Chest: coarse breath sounds CVS: S1 &S2 heard. No murmur.  Regular rate and rhythm. Abdomen: Soft, nontender mildly distended,.  Bowel sounds are heard.   Extremities: No cyanosis, clubbing but with bilateral lower extremity edema, chronic venous insufficiency peripheral pulses are palpable. Psych: Alert, awake and oriented, normal mood CNS:  No cranial nerve deficits.  Power equal in all extremities.   Skin: Warm and dry.  No rashes noted.   Data Review: I have personally reviewed the following laboratory data and studies,  CBC: Recent Labs  Lab 12/17/20 1134 12/18/20 0459 12/19/20 0519 12/20/20 0529 12/22/20 0245  WBC 9.4 12.1* 10.6* 8.7 7.3  HGB 17.1* 17.2* 16.7 17.1* 17.3*  HCT 52.1* 54.4* 54.0* 55.5* 56.0*  MCV 100.8* 104.2* 106.1* 105.7* 104.5*  PLT 135* 142* 129* 140* [id number]*   Basic Metabolic Panel: Recent Labs  Lab 12/17/20 1305 12/18/20 0459 12/19/20 0519 12/20/20 0529 12/21/20 0557 12/22/20 0245  NA  --  140 139 140 142 143  K  --  4.0 4.0 4.5 4.6 4.0  CL  --  97* 93* 90* 89* 83*  CO2  --  31 34* 38* 43* 45*  GLUCOSE  --  106*  126* 131* 122* 117*  BUN  --  17 27* 36* 38* 38*  CREATININE  --  1.01 1.12 1.02 0.95 0.94  CALCIUM  --  8.6* 8.8* 8.7* 8.9 9.2  MG 1.8  --  1.8 1.9 2.1 2.2  PHOS  --   --  4.0 3.8 4.0 3.0   Liver Function Tests: Recent Labs  Lab 12/17/20 1305 12/18/20 0459 12/19/20 0519 12/20/20 0529 12/21/20 0557  AST 30 24 27 28 24   ALT 24 23 23 24 23   ALKPHOS 73 73 70 62 53  BILITOT 1.5* 1.7* 1.2 0.7 1.0  PROT 7.0 6.9 6.8 6.9 6.3*  ALBUMIN 3.8 3.8 3.6 3.6 3.3*   No results for input(s): LIPASE, AMYLASE in the last 168 hours. No results for input(s): AMMONIA in the last 168 hours. Cardiac  Enzymes: No results for input(s): CKTOTAL, CKMB, CKMBINDEX, TROPONINI in the last 168 hours. BNP (last 3 results) Recent Labs    01/11/20 1444 12/17/20 1134  BNP 267.3* 1,374.9*    ProBNP (last 3 results) No results for input(s): PROBNP in the last 8760 hours.  CBG: Recent Labs  Lab 12/20/20 0724 12/20/20 1133 12/21/20 0752 12/21/20 1152 12/21/20 2216  GLUCAP 157* 164* 147* 202* 210*   Recent Results (from the past 240 hour(s))  Resp Panel by RT-PCR (Flu A&B, Covid) Nasopharyngeal Swab     Status: None   Collection Time: 12/17/20 11:34 AM   Specimen: Nasopharyngeal Swab; Nasopharyngeal(NP) swabs in vial transport medium  Result Value Ref Range Status   SARS Coronavirus 2 by RT PCR NEGATIVE NEGATIVE Final    Comment: (NOTE) SARS-CoV-2 target nucleic acids are NOT DETECTED.  The SARS-CoV-2 RNA is generally detectable in upper respiratory specimens during the acute phase of infection. The lowest concentration of SARS-CoV-2 viral copies this assay can detect is 138 copies/mL. A negative result does not preclude SARS-Cov-2 infection and should not be used as the sole basis for treatment or other patient management decisions. A negative result may occur with  improper specimen collection/handling, submission of specimen other than nasopharyngeal swab, presence of viral mutation(s) within  the areas targeted by this assay, and inadequate number of viral copies(<138 copies/mL). A negative result must be combined with clinical observations, patient history, and epidemiological information. The expected result is Negative.  Fact Sheet for Patients:  EntrepreneurPulse.com.au  Fact Sheet for Healthcare Providers:  IncredibleEmployment.be  This test is no t yet approved or cleared by the [place] FDA and  has been authorized for detection and/or diagnosis of SARS-CoV-2 by FDA under an Emergency Use Authorization (EUA). This EUA will remain  in effect (meaning this test can be used) for the duration of the COVID-19 declaration under Section 564(b)(1) of the Act, 21 U.S.C.section 360bbb-3(b)(1), unless the authorization is terminated  or revoked sooner.       Influenza A by PCR NEGATIVE NEGATIVE Final   Influenza B by PCR NEGATIVE NEGATIVE Final    Comment: (NOTE) The Xpert Xpress SARS-CoV-2/FLU/RSV plus assay is intended as an aid in the diagnosis of influenza from Nasopharyngeal swab specimens and should not be used as a sole basis for treatment. Nasal washings and aspirates are unacceptable for Xpert Xpress SARS-CoV-2/FLU/RSV testing.  Fact Sheet for Patients: EntrepreneurPulse.com.au  Fact Sheet for Healthcare Providers: IncredibleEmployment.be  This test is not yet approved or cleared by the [place] FDA and has been authorized for detection and/or diagnosis of SARS-CoV-2 by FDA under an Emergency Use Authorization (EUA). This EUA will remain in effect (meaning this test can be used) for the duration of the COVID-19 declaration under Section 564(b)(1) of the Act, 21 U.S.C. section 360bbb-3(b)(1), unless the authorization is terminated or revoked.  Performed at [facility], [place] [address] [place], [place] [address]   MRSA PCR Screening     Status: None    Collection Time: 12/20/20  4:01 PM   Specimen: Nasal Mucosa; Nasopharyngeal  Result Value Ref Range Status   MRSA by PCR NEGATIVE NEGATIVE Final    Comment:        The GeneXpert MRSA Assay (FDA approved for NASAL specimens only), is one component of a comprehensive MRSA colonization surveillance program. It is not intended to diagnose MRSA infection nor to guide or monitor treatment for MRSA infections. Performed at [facility] : [facility], [place]  [address] [place], [place] [address]      Studies: No results found.    Flora Lipps, MD  [facility] 12/22/2020  If 7PM-7AM, please contact night-coverage

## 2020-12-23 ENCOUNTER — Ambulatory Visit: Payer: 59 | Admitting: Primary Care

## 2020-12-23 DIAGNOSIS — D696 Thrombocytopenia, unspecified: Secondary | ICD-10-CM | POA: Diagnosis not present

## 2020-12-23 DIAGNOSIS — E118 Type 2 diabetes mellitus with unspecified complications: Secondary | ICD-10-CM | POA: Diagnosis not present

## 2020-12-23 DIAGNOSIS — E8770 Fluid overload, unspecified: Secondary | ICD-10-CM

## 2020-12-23 DIAGNOSIS — I5041 Acute combined systolic (congestive) and diastolic (congestive) heart failure: Secondary | ICD-10-CM | POA: Diagnosis not present

## 2020-12-23 DIAGNOSIS — I5021 Acute systolic (congestive) heart failure: Secondary | ICD-10-CM | POA: Diagnosis not present

## 2020-12-23 DIAGNOSIS — I509 Heart failure, unspecified: Secondary | ICD-10-CM | POA: Diagnosis not present

## 2020-12-23 DIAGNOSIS — I10 Essential (primary) hypertension: Secondary | ICD-10-CM | POA: Diagnosis not present

## 2020-12-23 LAB — CBC
HCT: 56.2 % — ABNORMAL HIGH (ref 39.0–52.0)
Hemoglobin: 17.9 g/dL — ABNORMAL HIGH (ref 13.0–17.0)
MCH: 32.7 pg (ref 26.0–34.0)
MCHC: 31.9 g/dL (ref 30.0–36.0)
MCV: 102.7 fL — ABNORMAL HIGH (ref 80.0–100.0)
Platelets: 176 10*3/uL (ref 150–400)
RBC: 5.47 MIL/uL (ref 4.22–5.81)
RDW: 12.3 % (ref 11.5–15.5)
WBC: 8.2 10*3/uL (ref 4.0–10.5)
nRBC: 0 % (ref 0.0–0.2)

## 2020-12-23 LAB — GLUCOSE, CAPILLARY
Glucose-Capillary: 169 mg/dL — ABNORMAL HIGH (ref 70–99)
Glucose-Capillary: 163 mg/dL — ABNORMAL HIGH (ref 70–99)
Glucose-Capillary: 292 mg/dL — ABNORMAL HIGH (ref 70–99)
Glucose-Capillary: 107 mg/dL — ABNORMAL HIGH (ref 70–99)
Glucose-Capillary: 71 mg/dL (ref 70–99)
Glucose-Capillary: 264 mg/dL — ABNORMAL HIGH (ref 70–99)

## 2020-12-23 LAB — BASIC METABOLIC PANEL
Anion gap: 14 (ref 5–15)
BUN: 38 mg/dL — ABNORMAL HIGH (ref 8–23)
CO2: 45 mmol/L — ABNORMAL HIGH (ref 22–32)
Calcium: 9.1 mg/dL (ref 8.9–10.3)
Chloride: 82 mmol/L — ABNORMAL LOW (ref 98–111)
Creatinine, Ser: 0.96 mg/dL (ref 0.61–1.24)
GFR, Estimated: >60 mL/min (ref >60)
Glucose, Bld: 132 mg/dL — ABNORMAL HIGH (ref 70–99)
Potassium: 3.5 mmol/L (ref 3.5–5.1)
Sodium: 141 mmol/L (ref 135–145)

## 2020-12-23 LAB — MAGNESIUM: Magnesium: 2.3 mg/dL (ref 1.7–2.4)

## 2020-12-23 LAB — PHOSPHORUS: Phosphorus: 3.3 mg/dL (ref 2.5–4.6)

## 2020-12-23 MED ORDER — PREDNISONE 10 MG PO TABS
10.0000 mg | ORAL_TABLET | Freq: Every day | ORAL | Status: DC
  Administered 2020-12-24 – 2020-12-30 (×7): 10 mg via ORAL
  Filled 2020-12-23 (×7): qty 1

## 2020-12-23 MED ORDER — POTASSIUM CHLORIDE CRYS ER 20 MEQ PO TBCR
40.0000 meq | EXTENDED_RELEASE_TABLET | Freq: Once | ORAL | Status: AC
  Administered 2020-12-23: 40 meq via ORAL
  Filled 2020-12-23: qty 2

## 2020-12-23 MED ORDER — ENOXAPARIN SODIUM 60 MG/0.6ML ~~LOC~~ SOLN
50.0000 mg | SUBCUTANEOUS | Status: DC
  Administered 2020-12-23 – 2020-12-27 (×5): 50 mg via SUBCUTANEOUS
  Filled 2020-12-23 (×5): qty 0.6

## 2020-12-23 MED ORDER — SPIRONOLACTONE 25 MG PO TABS
25.0000 mg | ORAL_TABLET | Freq: Every day | ORAL | Status: DC
  Administered 2020-12-23 – 2020-12-26 (×4): 25 mg via ORAL
  Filled 2020-12-23 (×5): qty 1

## 2020-12-23 NOTE — Evaluation (Signed)
Physical Therapy Evaluation Patient Details Name: Robert Church MRN: [id number] DOB: 06/07/1956 Today's Date: 12/23/2020   History of Present Illness  Robert Church is a 65 y.o. male with medical history significant of CHF, COPD, HTN  presented to hospital with bilateral leg swelling and abdominal distention.  Patient stated compliance with his heart failure medication but despite that he continued to have increased swelling.  Of note, the patient had seen his primary care physician who had plans to obtain CT abdomen of his chest abdomen and pelvis as outpatient.  In the ED, patient was noted to have there is edema extending up to the abdomen with BNP of 1300+. CXR showed some pulmonary congestion.  Clinical Impression  The patient ambulated x 40' using RW and 56 L [place]. SPO2 885 with mobility, back to 892% after resting. HR max 117-122. Patient lives alone, Patient's mother present and reports hopeful patient will stay with her. Patient says still in discussion. Pt admitted with above diagnosis. Pt currently with functional limitations due to the deficits listed below (see PT Problem List). Pt will benefit from skilled PT to increase their independence and safety with mobility to allow discharge to the venue listed below.       Follow Up Recommendations Home health PT    Equipment Recommendations   (tba)    Recommendations for Other Services OT consult     Precautions / Restrictions Precautions Precautions: Fall Precaution Comments: monitor sats and HR      Mobility  Bed Mobility Overal bed mobility: Modified Independent             General bed mobility comments: HOB raised    Transfers Overall transfer level: Needs assistance Equipment used: Rolling walker (2 wheeled) Transfers: Sit to/from Stand              Ambulation/Gait Ambulation/Gait assistance: Min assist;+2 safety/equipment Gait Distance (Feet): 40 Feet Assistive device: Rolling walker (2  wheeled) Gait Pattern/deviations: Step-through pattern Gait velocity: decr   General Gait Details: patient  ambulated with RW , gait steady. Reports feeling Dizzy.  Stairs            Wheelchair Mobility    Modified Rankin (Stroke Patients Only)       Balance Overall balance assessment: Mild deficits observed, not formally tested                                           Pertinent Vitals/Pain Pain Assessment: No/denies pain    Home Living Family/patient expects to be discharged to:: Private residence Living Arrangements: Alone Available Help at Discharge: Family;Available 24 hours/day Type of Home: House Home Access: Stairs to enter   [employer] of Steps: 2 Home Layout: One level Home Equipment: None      Prior Function Level of Independence: Independent               Hand Dominance        Extremity/Trunk Assessment   Upper Extremity Assessment Upper Extremity Assessment: Overall WFL for tasks assessed    Lower Extremity Assessment Lower Extremity Assessment: Generalized weakness    Cervical / Trunk Assessment Cervical / Trunk Assessment: Normal  Communication   Communication: No difficulties  Cognition Arousal/Alertness: Awake/alert Behavior During Therapy: WFL for tasks assessed/performed Overall Cognitive Status: Within Functional Limits for tasks assessed  General Comments      Exercises     Assessment/Plan    PT Assessment Patient needs continued PT services  PT Problem List Decreased strength;Decreased mobility;Decreased activity tolerance;Decreased safety awareness;Decreased balance;Decreased knowledge of precautions       PT Treatment Interventions DME instruction;Therapeutic activities;Gait training;Therapeutic exercise;Patient/family education;Functional mobility training    PT Goals (Current goals can be found in the Care Plan section)   Acute Rehab PT Goals Patient Stated Goal: to go home PT Goal Formulation: With patient Time For Goal Achievement: 01/06/21 Potential to Achieve Goals: Good    Frequency Min 3X/week   Barriers to discharge        Co-evaluation               AM-PAC PT "6 Clicks" Mobility  Outcome Measure Help needed turning from your back to your side while in a flat bed without using bedrails?: None Help needed moving from lying on your back to sitting on the side of a flat bed without using bedrails?: None Help needed moving to and from a bed to a chair (including a wheelchair)?: A Little Help needed standing up from a chair using your arms (e.g., wheelchair or bedside chair)?: A Little Help needed to walk in hospital room?: A Little Help needed climbing 3-5 steps with a railing? : A Little 6 Click Score: 20    End of Session Equipment Utilized During Treatment: Gait belt;Oxygen Activity Tolerance: Patient tolerated treatment well Patient left: in chair;with call bell/phone within reach;with chair alarm set;with nursing/sitter in room Nurse Communication: Mobility status PT Visit Diagnosis: Unsteadiness on feet (R26.81);Difficulty in walking, not elsewhere classified (R26.2)    Time: 1410-1435 PT Time Calculation (min) (ACUTE ONLY): 25 min   Charges:   PT Evaluation $PT Eval Low Complexity: 1 Low PT Treatments $Gait Training: 8-22 mins        Tresa Endo PT Acute Rehabilitation Services Pager [phone] Office [phone]   Claretha Cooper 12/23/2020, 4:05 PM

## 2020-12-23 NOTE — Progress Notes (Signed)
Patient continues to exhibit signs of hypercapnia associated with chronic respiratory failure secondary to severe COPD. Interruption or failure to provide NIV would quickly lead to exacerbation of the patient's condition, hospital admission, and likely harm to the patient. Continued use is preferred. The use of the NIV will treat patient's high PC02 levels and can reduce risk of exacerbations and future hospitalizations when used at night and during the day. BiLevel/RAD has been considered and ruled out as patient requires continuous alarms, backup battery, and portability which are not possible with BiLevel/RAD devices. Ventilation is required to decrease the work of breathing and improve pulmonary status. Interruption of ventilator support would lead to decline of health status. Patient is able to protect their airways and clear secretions on their own.    

## 2020-12-23 NOTE — Plan of Care (Signed)

## 2020-12-23 NOTE — Progress Notes (Signed)
Progress Note  Patient Name: Robert Church Date of Encounter: 12/23/2020  [facility] Cardiologist: Nanetta Batty, MD   Subjective   Patient states that he is feeling better. Breathing and swelling improved. Wants to go home, but willing to stay a couple more days for diuresis. Brisk diuresis overnight. Net negative 3.29L. Wt not recorded today.  Inpatient Medications    Scheduled Meds: . allopurinol  300 mg Oral Daily  . chlorhexidine  15 mL Mouth Rinse BID  . Chlorhexidine Gluconate Cloth  6 each Topical Daily  . enoxaparin (LOVENOX) injection  50 mg Subcutaneous Q24H  . ezetimibe  10 mg Oral Daily  . furosemide  80 mg Intravenous TID PC  . hydrALAZINE  10 mg Oral TID  . insulin aspart  0-9 Units Subcutaneous TID WC  . levofloxacin  500 mg Oral Daily  . losartan  100 mg Oral Daily  . mouth rinse  15 mL Mouth Rinse q12n4p  . mometasone-formoterol  2 puff Inhalation BID  . montelukast  10 mg Oral QHS  . nicotine  21 mg Transdermal Daily  . pantoprazole  40 mg Oral BID  . [START ON 12/24/2020] predniSONE  10 mg Oral Q breakfast  . roflumilast  500 mcg Oral Daily  . umeclidinium bromide  1 puff Inhalation Daily   Continuous Infusions:  PRN Meds: acetaminophen **OR** acetaminophen, albuterol, alum & mag hydroxide-simeth, LORazepam, nabumetone, ondansetron **OR** ondansetron (ZOFRAN) IV, traMADol   Vital Signs    Vitals:   12/23/20 0409 12/23/20 0500 12/23/20 0755 12/23/20 0800  BP:  (!) 128/92 129/68 (!) 129/91  Pulse:  81 87 89  Resp:  (!) 21 (!) 23 (!) 21  Temp: 97.8 F (36.6 C)   (!) 97.5 F (36.4 C)  TempSrc: Axillary   Oral  SpO2:  94% 95% 92%  Weight:      Height:        Intake/Output Summary (Last 24 hours) at 12/23/2020 1041 Last data filed at 12/23/2020 1039 Gross per 24 hour  Intake 660 ml  Output 2550 ml  Net -1890 ml   Last 3 Weights 12/22/2020 12/21/2020 12/18/2020  Weight (lbs) 227 lb 11.2 oz 241 lb 1.6 oz 255 lb 8 oz  Weight (kg) 103.284  kg 109.362 kg 115.894 kg      Telemetry    NSR, frequent PVCs - Personally Reviewed  ECG    No new ECG - Personally Reviewed  Physical Exam   GEN: No acute distress.   Neck: Supple Cardiac: RRR, no murmurs, rubs, or gallops.  Respiratory: Faint expiratory wheezing with L>R GI: Soft, nontender, non-distended  MS: 1+ tense pitting edema to the knees; chronic venous stasis changes Neuro:  Nonfocal  Psych: Normal affect   Labs    High Sensitivity Troponin:   Recent Labs  Lab 12/17/20 1134 12/17/20 1305  TROPONINIHS 26* 27*      Chemistry Recent Labs  Lab 12/19/20 0519 12/20/20 0529 12/21/20 0557 12/22/20 0245 12/23/20 0330  NA 139 140 142 143 141  K 4.0 4.5 4.6 4.0 3.5  CL 93* 90* 89* 83* 82*  CO2 34* 38* 43* 45* 45*  GLUCOSE 126* 131* 122* 117* 132*  BUN 27* 36* 38* 38* 38*  CREATININE 1.12 1.02 0.95 0.94 0.96  CALCIUM 8.8* 8.7* 8.9 9.2 9.1  PROT 6.8 6.9 6.3*  --   --   ALBUMIN 3.6 3.6 3.3*  --   --   AST 27 28 24   --   --  ALT 23 24 23   --   --   ALKPHOS 70 62 53  --   --   BILITOT 1.2 0.7 1.0  --   --   GFRNONAA >60 >60 >60 >60 >60  ANIONGAP 12 12 10 15 14      Hematology Recent Labs  Lab 12/20/20 0529 12/22/20 0245 12/23/20 0330  WBC 8.7 7.3 8.2  RBC 5.25 5.36 5.47  HGB 17.1* 17.3* 17.9*  HCT 55.5* 56.0* 56.2*  MCV 105.7* 104.5* 102.7*  MCH 32.6 32.3 32.7  MCHC 30.8 30.9 31.9  RDW 12.6 12.1 12.3  PLT 140* 136* 176    BNP Recent Labs  Lab 12/17/20 1134  BNP 1,374.9*     DDimer  Recent Labs  Lab 12/19/20 0954  DDIMER 1.98*     Radiology    No results found.  Cardiac Studies   Echo 12/18/20 1. Left ventricular ejection fraction, by estimation, is 40 to 45%. The  left ventricle has mildly decreased function. The left ventricle  demonstrates global hypokinesis. The left ventricular internal cavity size  was moderately dilated. There is mild  left ventricular hypertrophy. Left ventricular diastolic parameters are   consistent with Grade II diastolic dysfunction (pseudonormalization).  Elevated left atrial pressure.  2. Right ventricular systolic function is mildly reduced. The right  ventricular size is severely enlarged. There is moderately elevated  pulmonary artery systolic pressure. The estimated right ventricular  systolic pressure is 123456 mmHg.  3. Left atrial size was mildly dilated.  4. Right atrial size was mildly dilated.  5. The mitral valve is normal in structure. No evidence of mitral valve  regurgitation.  6. The aortic valve was not well visualized. Aortic valve regurgitation  is not visualized. No aortic stenosis is present.  7. There is mild dilatation of the ascending aorta, measuring 37 mm.  8. The inferior vena cava is dilated in size with <50% respiratory  variability, suggesting right atrial pressure of 15 mmHg.   Cath 2018:  Normal coronary arteries.  Global hypokinesis, EF approximately 40%. LVEDP 18 mmHg.   RECOMMENDATIONS:   Sleep study to rule out obstructive sleep apnea.  Guideline mandated heart failure therapy.  CTA 2019: FINDINGS: A 120 kV prospective scan was triggered in the descending thoracic aorta at 111 HU's. Axial non-contrast 3 mm slices were carried out through the heart. The data set was analyzed on a dedicated work station and scored using the [place]. Gantry rotation speed was 250 msecs and collimation was .6 mm. No beta blockade and 0.8 mg of sl NTG was given. The 3D data set was reconstructed in 5% intervals of the 67-82 % of the R-R cycle. Diastolic phases were analyzed on a dedicated work station using MPR, MIP and VRT modes. The patient received 80 cc of contrast.  Aorta:  Normal size.  No calcifications.  No dissection.  Aortic Valve:  Trileaflet.  No calcifications.  Coronary Arteries:  Normal coronary origin.  Right dominance.  RCA is a large dominant artery that gives rise to PDA and PLVB. There is  minimal calcified plaque.  Left main is a large artery that gives rise to LAD and LCX arteries. Left main has no plaque.  LAD is a large vessel that gives rise to one diagonal artery. There is mild calcified plaque in the proximal and mid portion with associated stenosis 25-50%.  LCX is a non-dominant artery that gives rise to one large OM1 branch. There is no plaque.  Other findings:  Normal pulmonary vein drainage into the left atrium.  Normal let atrial appendage without a thrombus.  IMPRESSION: 1. Coronary calcium score of 148. This was 39 percentile for age and sex matched control.  2. Normal coronary origin with right dominance.  3. The study is affected significantly by motion. However, in the visualized portions of coronary arteries there is only mild non-obstructive plaque. Aggressive risk factor modification is recommended  4. Dilated pulmonary artery measuring 34 mm suggestive of pulmonary hypertension.    Patient Profile     65 y.o. male with PMH of chronic systolic and diastolic heart failure, nonischemic cardiomyopathy, COPD, tobacco abuse, alcohol abuse, OSA noncompliant with CPAP, chronic venous insufficiency who is being followed in consultation for the management of heart failure.  Assessment & Plan    #Acute on chronic systolic and diastolic heart failure: TTE with LVEF 40-45% with global hypokinesis and G2DD. Has been followed by Dr. Haroldine Laws. Appears grossly overloaded with significant LE edema.  -Continue lasix 80mg  TID; responding well -Continue losartan 100mg  daily; per patient, his insurance does not cover entresto; will work on coverage as out-patient -Did not tolerate bisprolol due to fatigue -Start spironolactone 25mg  daily -Possible SGLT-2 inhibitor if affordable -Monitor I/Os and daily weights -Keep K>4, Mg>2; need to watch closely with brisk diuresis  #Acute hypercapneic respiratory failure with hypoxia: #OSA not compliant  with CPAP #COPD with active tobacco use Patient with history of COPD and OSA. Transferred to ICU due to worsening hypercapnea and placed on BiPAP with improvement. -Continue supplemental O2 and BiPAP at night -Will need to be compliant with CPAP at home  #HTN: -Continue losartan 100mg  daily -Start spironolactone 25mg  daily -Continue hydralazine for now; will likely transition to long-acting agent going forward as TID medication difficult to adhere to  #DMII: -Would benefit from SGLT-2 inhibitor if affordable at discharge -SSI while in-patient  #HLD: Did not tolerate statins. -Continue zetia       For questions or updates, please contact [facility] Please consult [email or web address] for contact info under        Signed, Freada Bergeron, MD  12/23/2020, 10:41 AM

## 2020-12-23 NOTE — Progress Notes (Signed)
Pt. pulled of BiPAP mask and only wants to wear n/c, placed at 6 lpm, RN In room.

## 2020-12-23 NOTE — Progress Notes (Addendum)
PROGRESS NOTE  Robert Church [id number] DOB: [birth year] DOA: 12/17/2020 PCP: Janith Lima, MD   LOS: 6 days   Brief narrative: As per HPI,  Robert Church is a 65 y.o. male with medical history significant of CHF, COPD, HTN  presented to hospital with bilateral leg swelling and abdominal distention.  Patient stated compliance with his heart failure medication but despite that he continued to have increased swelling.  Of note, the patient had seen his primary care physician who had plans to obtain CT abdomen of his chest abdomen and pelvis as outpatient.  In the ED, patient was noted to have there is edema extending up to the abdomen with BNP of 1300+. CXR showed some pulmonary congestion. He was given IV lasix and was considered for admission to the hospital for acute decompensated heart failure.  Assessment/Plan:  Principal Problem:   Acute CHF (congestive heart failure) (HCC) Active Problems:   HTN (hypertension)   Type II diabetes mellitus with manifestations (HCC)   Thrombocytopenia (HCC)  Acute on chronic combined HF Extensive peripheral edema on presentation.  On IV Lasix 80 mg TID as per cardiology recommendations.  Has been having good diuresis with a net negative balance of more than 14 L.  Continue nebulizers, BiPAP.  2D echocardiogram from 12/18/2020 showed LV ejection fraction of 40 to 45% with global hypokinesis of the left ventricular wall and diastolic dysfunction grade 2. Continue daily weights.  Strict intake and output charting.  Continue Fluid restriction 1200 mL/day. Creatinine stable with diuresis.  Follow cardiology recommendations.  Acute hypoxic and hypercapnic respiratory failure  multifactorial secondary to CHF, COPD, OSA with underlying obesity. on BiPAP.  Continue IV diuresis, steroids, nebulizers and inhalers.  Pulmonary on board. Will  benefit from Trilogy at home to prevent from recurrent decompensation, improve compliance, decreased recurrent  admissions to the hospital.  Transition of care has been consulted.  COPD with acute exacerbation. Not on oxygen at baseline.  Continue nebulizers, inhalers including dulera, daliresp, BiPAP.  Pulmonary on board.  Continue IV steroids.  on Levaquin since allergy to other antibiotics   History of sleep apnea.  Currently on BiPAP.  Noncompliant with CPAP at home.  Elevated troponins Elevated but flat troponin.  No chest pain.  Unlikely to be ACS.   Thrombocytopenia Mild.  No evidence of bleeding.  Latest platelet count of   HTN On Cozaar, IV Lasix.   Follow blood pressure closely.  Anxiety    Continue Xanax as needed.  Hyperlipidemia     - continue zetia  Diabetes mellitus type 2. Hemoglobin A1c 6.6 (11/28/20).  Continue to hold Metformin.  Continue sliding scale insulin, Accu-Cheks, diabetic diet.   Could benefit from SGLT 2 inhibitors on discharge  Goals of care full code at this time.    Debility deconditioning.  Will get PT evaluation.  Ambulate the patient in house  DVT prophylaxis: Lovenox subcu  Code Status: Full code  Family Communication:  None today.   Status is: Inpatient  Remains inpatient appropriate because:IV treatments appropriate due to intensity of illness or inability to take PO and Inpatient level of care appropriate due to severity of illness, decompensated heart failure requiring  IV diuresis, metabolic encephalopathy, COPD exacerbation and hypercarbia requiring BiPAP,   Dispo: The patient is from: Home              Anticipated d/c is to: Home with home health  Anticipated d/c date is: 1 to 2 days              Patient currently is not medically stable to d/c.  Will transfer the patient out of stepdown unit today  Consultants:  Cardiology  Pulmonary.  Procedures:  BiPAP  Antibiotics:  . Levaquin  Anti-infectives (From admission, onward)   Start     Dose/Rate Route Frequency Ordered Stop   12/22/20 1230  levofloxacin  (LEVAQUIN) tablet 500 mg        500 mg Oral Daily 12/22/20 1127     12/20/20 1500  levofloxacin (LEVAQUIN) IVPB 500 mg  Status:  Discontinued        500 mg 100 mL/hr over 60 Minutes Intravenous Every 24 hours 12/20/20 1340 12/22/20 1127     Subjective: Today, patient was seen and examined at bedside.  Patient denies any pain, dyspnea, chest pain, fever or chills.   Objective: Vitals:   12/23/20 0409 12/23/20 0500  BP:  (!) 128/92  Pulse:  81  Resp:  (!) 21  Temp: 97.8 F (36.6 C)   SpO2:  94%    Intake/Output Summary (Last 24 hours) at 12/23/2020 0757 Last data filed at 12/23/2020 0500 Gross per 24 hour  Intake 910 ml  Output 4200 ml  Net -3290 ml   Filed Weights   12/18/20 0546 12/21/20 0330 12/22/20 0800  Weight: 115.9 kg 109.4 kg 103.3 kg   Body mass index is 32.67 kg/m.   Physical Exam:  General: Obese, alert awake, communicative,, alert awake, oriented on nasal cannula HENT:   No scleral pallor or icterus noted. Oral mucosa is moist.  Chest: coarse breath sounds CVS: S1 &S2 heard. No murmur.  Regular rate and rhythm. Abdomen: Soft, nontender mildly distended,.  Bowel sounds are heard.   Extremities: No cyanosis, clubbing but with bilateral lower extremity edema which has significantly improved, chronic venous insufficiency peripheral pulses are palpable. Psych: Alert, awake and oriented, normal mood CNS:  No cranial nerve deficits.  Power equal in all extremities.   Skin: Warm and dry.  No rashes noted.   Data Review: I have personally reviewed the following laboratory data and studies,  CBC: Recent Labs  Lab 12/18/20 0459 12/19/20 0519 12/20/20 0529 12/22/20 0245 12/23/20 0330  WBC 12.1* 10.6* 8.7 7.3 8.2  HGB 17.2* 16.7 17.1* 17.3* 17.9*  HCT 54.4* 54.0* 55.5* 56.0* 56.2*  MCV 104.2* 106.1* 105.7* 104.5* 102.7*  PLT 142* 129* 140* 136* 176   Basic Metabolic Panel: Recent Labs  Lab 12/19/20 0519 12/20/20 0529 12/21/20 0557 12/22/20 0245  12/23/20 0330  NA 139 140 142 143 141  K 4.0 4.5 4.6 4.0 3.5  CL 93* 90* 89* 83* 82*  CO2 34* 38* 43* 45* 45*  GLUCOSE 126* 131* 122* 117* 132*  BUN 27* 36* 38* 38* 38*  CREATININE 1.12 1.02 0.95 0.94 0.96  CALCIUM 8.8* 8.7* 8.9 9.2 9.1  MG 1.8 1.9 2.1 2.2 2.3  PHOS 4.0 3.8 4.0 3.0 3.3   Liver Function Tests: Recent Labs  Lab 12/17/20 1305 12/18/20 0459 12/19/20 0519 12/20/20 0529 12/21/20 0557  AST 30 24 27 28 24   ALT 24 23 23 24 23   ALKPHOS 73 73 70 62 53  BILITOT 1.5* 1.7* 1.2 0.7 1.0  PROT 7.0 6.9 6.8 6.9 6.3*  ALBUMIN 3.8 3.8 3.6 3.6 3.3*   No results for input(s): LIPASE, AMYLASE in the last 168 hours. No results for input(s): AMMONIA in the last 168 hours. Cardiac  Enzymes: No results for input(s): CKTOTAL, CKMB, CKMBINDEX, TROPONINI in the last 168 hours. BNP (last 3 results) Recent Labs    01/11/20 1444 12/17/20 1134  BNP 267.3* 1,374.9*    ProBNP (last 3 results) No results for input(s): PROBNP in the last 8760 hours.  CBG: Recent Labs  Lab 12/22/20 0742 12/22/20 1338 12/22/20 1658 12/22/20 2132 12/23/20 0738  GLUCAP 158* 251* 166* 179* 163*   Recent Results (from the past 240 hour(s))  Resp Panel by RT-PCR (Flu A&B, Covid) Nasopharyngeal Swab     Status: None   Collection Time: 12/17/20 11:34 AM   Specimen: Nasopharyngeal Swab; Nasopharyngeal(NP) swabs in vial transport medium  Result Value Ref Range Status   SARS Coronavirus 2 by RT PCR NEGATIVE NEGATIVE Final    Comment: (NOTE) SARS-CoV-2 target nucleic acids are NOT DETECTED.  The SARS-CoV-2 RNA is generally detectable in upper respiratory specimens during the acute phase of infection. The lowest concentration of SARS-CoV-2 viral copies this assay can detect is 138 copies/mL. A negative result does not preclude SARS-Cov-2 infection and should not be used as the sole basis for treatment or other patient management decisions. A negative result may occur with  improper specimen  collection/handling, submission of specimen other than nasopharyngeal swab, presence of viral mutation(s) within the areas targeted by this assay, and inadequate number of viral copies(<138 copies/mL). A negative result must be combined with clinical observations, patient history, and epidemiological information. The expected result is Negative.  Fact Sheet for Patients:  EntrepreneurPulse.com.au  Fact Sheet for Healthcare Providers:  IncredibleEmployment.be  This test is no t yet approved or cleared by the [place] FDA and  has been authorized for detection and/or diagnosis of SARS-CoV-2 by FDA under an Emergency Use Authorization (EUA). This EUA will remain  in effect (meaning this test can be used) for the duration of the COVID-19 declaration under Section 564(b)(1) of the Act, 21 U.S.C.section 360bbb-3(b)(1), unless the authorization is terminated  or revoked sooner.       Influenza A by PCR NEGATIVE NEGATIVE Final   Influenza B by PCR NEGATIVE NEGATIVE Final    Comment: (NOTE) The Xpert Xpress SARS-CoV-2/FLU/RSV plus assay is intended as an aid in the diagnosis of influenza from Nasopharyngeal swab specimens and should not be used as a sole basis for treatment. Nasal washings and aspirates are unacceptable for Xpert Xpress SARS-CoV-2/FLU/RSV testing.  Fact Sheet for Patients: EntrepreneurPulse.com.au  Fact Sheet for Healthcare Providers: IncredibleEmployment.be  This test is not yet approved or cleared by the [place] FDA and has been authorized for detection and/or diagnosis of SARS-CoV-2 by FDA under an Emergency Use Authorization (EUA). This EUA will remain in effect (meaning this test can be used) for the duration of the COVID-19 declaration under Section 564(b)(1) of the Act, 21 U.S.C. section 360bbb-3(b)(1), unless the authorization is terminated or revoked.  Performed at [facility], [place] [address], [place], [place] [address]   MRSA PCR Screening     Status: None   Collection Time: 12/20/20  4:01 PM   Specimen: Nasal Mucosa; Nasopharyngeal  Result Value Ref Range Status   MRSA by PCR NEGATIVE NEGATIVE Final    Comment:        The GeneXpert MRSA Assay (FDA approved for NASAL specimens only), is one component of a comprehensive MRSA colonization surveillance program. It is not intended to diagnose MRSA infection nor to guide or monitor treatment for MRSA infections. Performed at [facility], [place]  [address], [place], [place] [address]      Studies: No results found.    Flora Lipps, MD  [facility] 12/23/2020  If 7PM-7AM, please contact night-coverage

## 2020-12-23 NOTE — Progress Notes (Signed)
   NAME:  Robert Church, MRN:  [id number], DOB:  29-May-1956, LOS: 6 ADMISSION DATE:  12/17/2020, CONSULTATION DATE:  12/20/2020 REFERRING MD:  Dr Tyson Babinski, CHIEF COMPLAINT: Hypercapnic respiratory failure  Brief History:  Patient came into the hospital complains of dyspnea, leg swelling Was being treated for decompensated heart failure Progressive lethargy ABG revealing hypercapnic respiratory failure Transferred to the ICU for further management on a BiPAP  History of Present Illness:  Underlying history of heart failure Progressive lower extremity swelling with associated shortness of breath  Past Medical History:   Past Medical History:  Diagnosis Date  . Allergy   . Asthma   . Bronchitis   . CHF (congestive heart failure) (HCC)   . COPD (chronic obstructive pulmonary disease) (HCC)   . GERD (gastroesophageal reflux disease)   . Hypertension   . Pneumonia   . Sinusitis   . SOB (shortness of breath)    Significant Hospital Events:  On BiPAP 12/20/2020 1/3: stable, on [place]  Consults:  pccm  Procedures:  None  Significant Diagnostic Tests:  Echocardiogram 12/18/2020 revealing pulmonary hypertension, reduced ejection fraction  Micro Data:  None  Antimicrobials:  Levofloxacin 12/31>>  Interim History / Subjective:  Alert, interactive, sitting in chair eating breakfast.  Objective   Blood pressure 129/68, pulse 87, temperature (!) 97.5 F (36.4 C), temperature source Oral, resp. rate (!) 23, height 5\' 10"  (1.778 m), weight 103.3 kg, SpO2 95 %.    FiO2 (%):  [40 %] 40 %   Intake/Output Summary (Last 24 hours) at 12/23/2020 0939 Last data filed at 12/23/2020 0500 Gross per 24 hour  Intake 910 ml  Output 4200 ml  Net -3290 ml   Filed Weights   12/18/20 0546 12/21/20 0330 12/22/20 0800  Weight: 115.9 kg 109.4 kg 103.3 kg    Examination: General: Middle-age gentleman, morbidly obese HENT: Dry oral mucosa Lungs: Decreased air entry  bilaterally Cardiovascular: S1-S2 appreciated Abdomen: Soft, bowel sounds appreciated  Resolved Hospital Problem list     Assessment & Plan:  Acute hypercapnic respiratory failure -Continue oxygen supplementation -Encourage BiPAP use at night  Patient with underlying obstructive sleep apnea Was not compliant with CPAP use -Open to further treatment -CPAP at d/c -Will require repeat outptatient sleep study for titration   Chronic obstructive pulmonary disease -Remains an active smoker -Decrease to home daily prednisone, resume breztri at discharge   Heart failure with reduced ejection fraction -Diuresis -Continue current medications  Type 2 diabetes -SSI  Pulmonary hypertension -Multifactorial including chronic hypoxia, chronic lung disease, heart disease  We will sign off. Recommend transfer to floor.

## 2020-12-23 NOTE — TOC Initial Note (Signed)
Transition of Care [facility]) - Initial/Assessment Note    Patient Details  Name: Robert Church MRN: [id number] Date of Birth: Aug 18, 1956  Transition of Care (TOC) CM/SW Contact:    Armanda Heritage, RN Phone Number: 12/23/2020, 10:01 AM  Clinical Narrative:                 CM noted TOC consult for trilogy vent, referral made to [employer] rep Zach.    Expected Discharge Plan: Home/Self Care Barriers to Discharge: Continued Medical Work up   Patient Goals and CMS Choice Patient states their goals for this hospitalization and ongoing recovery are:: to go home      Expected Discharge Plan and Services Expected Discharge Plan: Home/Self Care   Discharge Planning Services: CM Consult   Living arrangements for the past 2 months: Single Family Home                 DME Arranged: Ventilator DME Agency: [employer] Date DME Agency Contacted: 12/23/20 Time DME Agency Contacted: [phone] Representative spoke with at DME Agency: Ian Malkin            Prior Living Arrangements/Services Living arrangements for the past 2 months: Single Family Home   Patient language and need for interpreter reviewed:: Yes Do you feel safe going back to the place where you live?: Yes      Need for Family Participation in Patient Care: Yes (Comment) Care giver support system in place?: Yes (comment)   Criminal Activity/Legal Involvement Pertinent to Current Situation/Hospitalization: No - Comment as needed  Activities of Daily Living Home Assistive Devices/Equipment: Cane (specify quad or straight),Nebulizer ADL Screening (condition at time of admission) Patient's cognitive ability adequate to safely complete daily activities?: Yes Is the patient deaf or have difficulty hearing?: Yes Does the patient have difficulty seeing, even when wearing glasses/contacts?: Yes Does the patient have difficulty concentrating, remembering, or making decisions?: No Patient able to express need for assistance with ADLs?:  Yes Does the patient have difficulty dressing or bathing?: No Independently performs ADLs?: Yes (appropriate for developmental age) Does the patient have difficulty walking or climbing stairs?: Yes (gets sob) Weakness of Legs: Both Weakness of Arms/Hands: Both  Permission Sought/Granted                  Emotional Assessment Appearance:: Appears stated age Attitude/Demeanor/Rapport: Engaged Affect (typically observed): Accepting Orientation: : Oriented to Self,Oriented to Place,Oriented to  Time,Oriented to Situation   Psych Involvement: No (comment)  Admission diagnosis:  Acute CHF (congestive heart failure) (HCC) [I50.9] Acute respiratory failure with hypoxia (HCC) [J96.01] Acute on chronic heart failure, unspecified heart failure type (HCC) [I50.9] Hypervolemia, unspecified hypervolemia type [E87.70] Patient Active Problem List   Diagnosis Date Noted  . Acute CHF (congestive heart failure) (HCC) 12/17/2020  . Type II diabetes mellitus with manifestations (HCC) 11/29/2020  . Ataxia 11/29/2020  . Thrombocytopenia (HCC) 11/29/2020  . LAD (lymphadenopathy), inguinal 11/28/2020  . Edema of lower extremity due to peripheral venous insufficiency 10/29/2020  . Primary osteoarthritis of both knees 09/24/2020  . Gynecomastia, male 08/08/2020  . Idiopathic chronic gout of foot without tophus 08/08/2020  . Yellow jacket sting allergy 08/08/2020  . Prediabetes 08/08/2020  . Class 2 severe obesity due to excess calories with serious comorbidity and body mass index (BMI) of 38.0 to 38.9 in adult (HCC) 08/05/2020  . Hyperlipidemia LDL goal <70 07/11/2020  . Hypogonadism male 07/08/2020  . Hypertriglyceridemia 07/08/2020  . Routine general medical examination at a health care  facility 07/08/2020  . Umbilical hernia without obstruction and without gangrene 07/08/2020  . Chronic combined systolic and diastolic CHF (congestive heart failure) ([place]) 01/09/2020  . Solitary pulmonary nodule  08/15/2019  . 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 NICM (nonischemic cardiomyopathy) ([place]) 12/16/2018  . History of adenomatous polyp of colon 11/25/2018  . Dyslipidemia 12/15/2017  . Bronchitis 02/01/2017  . Morbid obesity ([place]) 11/24/2015  . Cigarette smoker 01/16/2015  . COPD, group D, by GOLD 2017 classification ([place]) 03/03/2013  . Obstructive sleep apnea syndrome 01/05/2013  . Asbestos exposure 01/05/2013  . Seasonal allergic rhinitis 10/06/2012  . GERD (gastroesophageal reflux disease) 06/05/2012  . Insomnia 06/05/2012  . 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 HTN (hypertension) 07/23/2011   PCP:  Janith Lima, MD Pharmacy:   [facility] [place], [address] [place] [address] Phone: [phone] Fax: [phone]     Social Determinants of Health (SDOH) Interventions    Readmission Risk Interventions No flowsheet data found.

## 2020-12-24 DIAGNOSIS — E8770 Fluid overload, unspecified: Secondary | ICD-10-CM | POA: Diagnosis not present

## 2020-12-24 DIAGNOSIS — I5021 Acute systolic (congestive) heart failure: Secondary | ICD-10-CM | POA: Diagnosis not present

## 2020-12-24 DIAGNOSIS — I10 Essential (primary) hypertension: Secondary | ICD-10-CM | POA: Diagnosis not present

## 2020-12-24 DIAGNOSIS — I509 Heart failure, unspecified: Secondary | ICD-10-CM | POA: Diagnosis not present

## 2020-12-24 LAB — GLUCOSE, CAPILLARY
Glucose-Capillary: 114 mg/dL — ABNORMAL HIGH (ref 70–99)
Glucose-Capillary: 197 mg/dL — ABNORMAL HIGH (ref 70–99)
Glucose-Capillary: 132 mg/dL — ABNORMAL HIGH (ref 70–99)
Glucose-Capillary: 178 mg/dL — ABNORMAL HIGH (ref 70–99)

## 2020-12-24 LAB — CREATININE, SERUM
Creatinine, Ser: 1.01 mg/dL (ref 0.61–1.24)
GFR, Estimated: >60 mL/min (ref >60)

## 2020-12-24 MED ORDER — AMLODIPINE BESYLATE 5 MG PO TABS
2.5000 mg | ORAL_TABLET | Freq: Every day | ORAL | Status: DC
  Administered 2020-12-25: 2.5 mg via ORAL
  Filled 2020-12-24 (×2): qty 1

## 2020-12-24 NOTE — Progress Notes (Addendum)
Progress Note  Patient Name: Robert Church Date of Encounter: 12/24/2020  [facility] Cardiologist: Quay Burow, MD   Subjective   Feels his breathing continues to improve though not quite back to baseline. LE edema also improving. No complaints of chest pain or palpitations. He understands another day or two of IV diuresis is planned.   Inpatient Medications    Scheduled Meds: . allopurinol  300 mg Oral Daily  . chlorhexidine  15 mL Mouth Rinse BID  . Chlorhexidine Gluconate Cloth  6 each Topical Daily  . enoxaparin (LOVENOX) injection  50 mg Subcutaneous Q24H  . ezetimibe  10 mg Oral Daily  . furosemide  80 mg Intravenous TID PC  . hydrALAZINE  10 mg Oral TID  . insulin aspart  0-9 Units Subcutaneous TID WC  . levofloxacin  500 mg Oral Daily  . losartan  100 mg Oral Daily  . mouth rinse  15 mL Mouth Rinse q12n4p  . mometasone-formoterol  2 puff Inhalation BID  . montelukast  10 mg Oral QHS  . nicotine  21 mg Transdermal Daily  . pantoprazole  40 mg Oral BID  . predniSONE  10 mg Oral Q breakfast  . roflumilast  500 mcg Oral Daily  . spironolactone  25 mg Oral Daily  . umeclidinium bromide  1 puff Inhalation Daily   Continuous Infusions:  PRN Meds: acetaminophen **OR** acetaminophen, albuterol, alum & mag hydroxide-simeth, LORazepam, nabumetone, ondansetron **OR** ondansetron (ZOFRAN) IV, traMADol   Vital Signs    Vitals:   12/23/20 2124 12/24/20 0513 12/24/20 0720 12/24/20 0854  BP: 112/75 119/78    Pulse: 96 73    Resp: 20 16    Temp: 97.9 F (36.6 C) 97.9 F (36.6 C)    TempSrc: Oral Oral    SpO2: 92% 93% 92% 92%  Weight:  104.6 kg    Height:        Intake/Output Summary (Last 24 hours) at 12/24/2020 0927 Last data filed at 12/24/2020 0720 Gross per 24 hour  Intake 360 ml  Output 3025 ml  Net -2665 ml   Last 3 Weights 12/24/2020 12/22/2020 12/21/2020  Weight (lbs) 230 lb 9.6 oz 227 lb 11.2 oz 241 lb 1.6 oz  Weight (kg) 104.6 kg 103.284 kg 109.362  kg      Telemetry    Sinus rhythm/sinus tachycardia with occasional PVCs - Personally Reviewed  ECG    No new tracings - Personally Reviewed  Physical Exam   GEN: Sitting in bedside chair in no acute distress.   Neck: No JVD Cardiac: RRR, no murmurs, rubs, or gallops.  Respiratory: scattered rhonchi without overt wheezing or crackles. GI: Soft, obese, nontender, non-distended  MS: trace-1+ LE edema; +rubor on dependency and chronic venous stasis skin changes. No deformity. Neuro:  Nonfocal  Psych: Normal affect   Labs    High Sensitivity Troponin:   Recent Labs  Lab 12/17/20 1134 12/17/20 1305  TROPONINIHS 26* 27*      Chemistry Recent Labs  Lab 12/19/20 0519 12/20/20 0529 12/21/20 0557 12/22/20 0245 12/23/20 0330 12/24/20 0411  NA 139 140 142 143 141  --   K 4.0 4.5 4.6 4.0 3.5  --   CL 93* 90* 89* 83* 82*  --   CO2 34* 38* 43* 45* 45*  --   GLUCOSE 126* 131* 122* 117* 132*  --   BUN 27* 36* 38* 38* 38*  --   CREATININE 1.12 1.02 0.95 0.94 0.96 1.01  CALCIUM  8.8* 8.7* 8.9 9.2 9.1  --   PROT 6.8 6.9 6.3*  --   --   --   ALBUMIN 3.6 3.6 3.3*  --   --   --   AST 27 28 24   --   --   --   ALT 23 24 23   --   --   --   ALKPHOS 70 62 53  --   --   --   BILITOT 1.2 0.7 1.0  --   --   --   GFRNONAA >60 >60 >60 >60 >60 >60  ANIONGAP 12 12 10 15 14   --      Hematology Recent Labs  Lab 12/20/20 0529 12/22/20 0245 12/23/20 0330  WBC 8.7 7.3 8.2  RBC 5.25 5.36 5.47  HGB 17.1* 17.3* 17.9*  HCT 55.5* 56.0* 56.2*  MCV 105.7* 104.5* 102.7*  MCH 32.6 32.3 32.7  MCHC 30.8 30.9 31.9  RDW 12.6 12.1 12.3  PLT 140* 136* 176    BNP Recent Labs  Lab 12/17/20 1134  BNP 1,374.9*     DDimer  Recent Labs  Lab 12/19/20 0954  DDIMER 1.98*     Radiology    No results found.  Cardiac Studies   Echo 12/18/20 1. Left ventricular ejection fraction, by estimation, is 40 to 45%. The  left ventricle has mildly decreased function. The left ventricle   demonstrates global hypokinesis. The left ventricular internal cavity size  was moderately dilated. There is mild  left ventricular hypertrophy. Left ventricular diastolic parameters are  consistent with Grade II diastolic dysfunction (pseudonormalization).  Elevated left atrial pressure.  2. Right ventricular systolic function is mildly reduced. The right  ventricular size is severely enlarged. There is moderately elevated  pulmonary artery systolic pressure. The estimated right ventricular  systolic pressure is 123456 mmHg.  3. Left atrial size was mildly dilated.  4. Right atrial size was mildly dilated.  5. The mitral valve is normal in structure. No evidence of mitral valve  regurgitation.  6. The aortic valve was not well visualized. Aortic valve regurgitation  is not visualized. No aortic stenosis is present.  7. There is mild dilatation of the ascending aorta, measuring 37 mm.  8. The inferior vena cava is dilated in size with <50% respiratory  variability, suggesting right atrial pressure of 15 mmHg.   Cath 2018:  Normal coronary arteries.  Global hypokinesis, EF approximately 40%. LVEDP 18 mmHg.   RECOMMENDATIONS:   Sleep study to rule out obstructive sleep apnea.  Guideline mandated heart failure therapy.  CTA 2019: FINDINGS: A 120 kV prospective scan was triggered in the descending thoracic aorta at 111 HU's. Axial non-contrast 3 mm slices were carried out through the heart. The data set was analyzed on a dedicated work station and scored using the [place]. Gantry rotation speed was 250 msecs and collimation was .6 mm. No beta blockade and 0.8 mg of sl NTG was given. The 3D data set was reconstructed in 5% intervals of the 67-82 % of the R-R cycle. Diastolic phases were analyzed on a dedicated work station using MPR, MIP and VRT modes. The patient received 80 cc of contrast.  Aorta: Normal size. No calcifications. No  dissection.  Aortic Valve: Trileaflet. No calcifications.  Coronary Arteries: Normal coronary origin. Right dominance.  RCA is a large dominant artery that gives rise to PDA and PLVB. There is minimal calcified plaque.  Left main is a large artery that gives rise to LAD and  LCX arteries. Left main has no plaque.  LAD is a large vessel that gives rise to one diagonal artery. There is mild calcified plaque in the proximal and mid portion with associated stenosis 25-50%.  LCX is a non-dominant artery that gives rise to one large OM1 branch. There is no plaque.  Other findings:  Normal pulmonary vein drainage into the left atrium.  Normal let atrial appendage without a thrombus.  IMPRESSION: 1. Coronary calcium score of 148. This was 42 percentile for age and sex matched control.  2. Normal coronary origin with right dominance.  3. The study is affected significantly by motion. However, in the visualized portions of coronary arteries there is only mild non-obstructive plaque. Aggressive risk factor modification is recommended  4. Dilated pulmonary artery measuring 34 mm suggestive of pulmonary hypertension.  Patient Profile     65 y.o. male with PMH of chronic systolic and diastolic heart failure, nonischemic cardiomyopathy, COPD, tobacco abuse, alcohol abuse, OSA noncompliant with CPAP, chronic venous insufficiency who is being followed in consultation for the management of heart failure.  Assessment & Plan    1. Acute on chronic combined CHF: patient presented with significant LE edema and SOB. Followed by Dr. Gala Romney outpatient for NICM. BNP 1379 and CXR with pulmonary vascular congestion without edema or consolidation.  Echo this admission with EF 40-45%, global hypokinesis, and G2DD. He was started on IV lasix 80mg  TID with UOP net -2.6L int he past 24 hours and -17.5L this admission. Weight 230lbs today, up from 227lbs yesterday, though significantly  less that 262lbs on admission. COPD has limited BBlocker options and he did not tolerate bisoprolol 2/2 fatigue - Continue IV lasix - anticipate transition to po in the next 24-48 hours - Continue losartan - will need to work on assistance coverage for outpatient as his insurance does not cover costs.  - Continue spironolactone (started this admission) - Will ask CM to check cost of SGLT2-inhibitor - Continue to monitor strict I&Os and daily weights - Continue to monitor electrolytes closely and replete as needed to maintain K >4, Mg >2  2. Acute hypercapneic respiratory failure in patient with COPD and OSA not compliant with CPAP: required BiPAP this admission, now weaned to BiPAP qHS. Importance of compliance with CPAP at home was stressed.  - Continue BiPAP qHS this admission and resume CPAP at home - Continue steroids/inhalers per primary team - Continue to encourage smoking cessation, though patient already reports looking forward to smoking a cigarette upon discharge  3. HTN: BP stable with addition of spironolactone yesterday. Also started on hydralazine this admission though concerns with compliance going forward given TID dosing.  - Continue losartan and spironolactone - May consider transition from hydralazine to amlodipine for BP control to improve compliance  4. HLD: intolerant to statins - continue zetia  5. DM type 2: A1C 6.6 11/2020.  - Will ask CM to check cost of SGLT2-inhibitors - Continue management per primary team    For questions or updates, please contact [facility] Please consult [email or web address] for contact info under        Signed, 12/2020, PA-C   Patient seen and examined and agree with Beatriz Stallion, PA-C as detailed above.  In brief, the patient is a 65 y.o. male with PMH of chronic systolic and diastolic heart failure, nonischemic cardiomyopathy, COPD, tobacco abuse, alcohol abuse, OSA noncompliant with CPAP, chronic venous  insufficiency who presented with acute on chronic heart failure for which Cardiology has  been consulted.  Has diuresed well with lasix 80mg  IV TID. Continues to require 6L [place] during the day and BiPAP at night. Has CPAP at home which he did not wear and it not titrated appropriately. He is anxious to leave the hospital but is willing to stay for additional diuresis.  Exam: GEN: No acute distress.   Neck: No JVD Cardiac: RRR, no murmurs, rubs, or gallops.  Respiratory: Scattered rhonchi and expiratory wheezing. Crackles at the bases. GI: Soft, nontender, non-distended  MS: trace-1+ edema (significantly improved) with dependent rubor and chronic venous insufficiency Neuro:  Nonfocal  Psych: Normal affect   Plan: -Continue lasix 80mg  IV TID -Continue spironolactone 25mg  daily -Continue losartan 100mg  daily (entresto not covered by insurance; will continue to work-on as out-patient) -Change hydralazine to amlodipine 2.5mg  to help with compliance -Will ask CM to look into cost of SGLT-2 inhibitor -Needs either CPAP or BiPAP set-up at home prior to discharge as high risk of rehospitalization if not ready upon discharge -Continue zetia as intolerant to statins  Total time of encounter: 35 minutes total time of encounter, including 20 minutes spent in face-to-face patient care on the date of this encounter. This time includes coordination of care and counseling regarding above mentioned problem list. Remainder of non-face-to-face time involved reviewing chart documents/testing relevant to the patient encounter and documentation in the medical record. I have independently reviewed documentation from referring provider.   Gwyndolyn Kaufman, MD [place]    12/24/2020, 9:27 AM

## 2020-12-24 NOTE — Progress Notes (Signed)
PROGRESS NOTE  Robert Church [id number] DOB: 09-16-1956 DOA: 12/17/2020 PCP: Janith Lima, MD   LOS: 7 days   Brief narrative: As per HPI,  Robert Church is a 65 y.o. male with medical history significant of CHF, COPD, HTN  presented to hospital with bilateral leg swelling and abdominal distention.  Patient stated compliance with his heart failure medication but despite that he continued to have increased swelling.  Of note, the patient had seen his primary care physician who had plans to obtain CT abdomen of his chest abdomen and pelvis as outpatient.  In the ED, patient was noted to have there is edema extending up to the abdomen with BNP of 1300+. CXR showed some pulmonary congestion. He was given IV lasix and was considered for admission to the hospital for acute decompensated heart failure, COPD exacerbation, acute on chronic respiratory failure.  Assessment/Plan:  Principal Problem:   Acute CHF (congestive heart failure) (HCC) Active Problems:   HTN (hypertension)   Type II diabetes mellitus with manifestations (HCC)   Thrombocytopenia (HCC)  Acute on chronic combined HF Extensive peripheral edema on presentation.  On IV Lasix 80 mg TID as per cardiology recommendations.  Has been having good diuresis with a net negative balance of more than 18 L.  Continue nebulizers, BiPAP.  2D echocardiogram from 12/18/2020 showed LV ejection fraction of 40 to 45% with global hypokinesis of the left ventricular wall and diastolic dysfunction grade 2. Continue daily weights.  Strict intake and output charting.  Continue fluid restriction 1200 mL/day. Creatinine stable with diuresis.  Cardiology still recommending IV diuresis, currently on IV lasix 80 mg TID.  Has high risk of recurrent admission to the hospital and would benefit from optimization of his medical condition prior to discharge.  Acute hypoxic and hypercapnic respiratory failure  multifactorial secondary to CHF, COPD,  OSA with underlying obesity. on BiPAP mostly at nighttime..  Continue IV diuresis, steroids, nebulizers and inhalers.  IV steroids have been changed to p.o. prednisone at this time.  Pulmonary was consulted during hospitalization.  Will  benefit from Trilogy at home to prevent from recurrent decompensation, improve compliance, decreased recurrent admissions to the hospital.    COPD with acute exacerbation. Not on oxygen at baseline.  Does have a CPAP machine at home which needs calibration.  Continue nebulizers, inhalers including dulera, daliresp, BiPAP.  Will benefit from trilogy on discharge.  Will discontinue Levaquin at this time.  History of sleep apnea.  Currently on BiPAP.  Noncompliant with CPAP at home.  Patient will be discharged with trilogy at home.  Arrangement under process.  Paper work has been done.  Trilogy should be available by tomorrow.  Elevated troponins Elevated but flat troponin.  No chest pain.  Unlikely to be ACS.   Thrombocytopenia Resolved at this time  HTN On Cozaar, IV Lasix, amlodipine at this time..   Follow blood pressure closely.  Anxiety    Continue Xanax as needed.  Hyperlipidemia     - continue zetia  Diabetes mellitus type 2. Hemoglobin A1c 6.6 (11/28/20).  Continue to hold Metformin.  Continue sliding scale insulin, Accu-Cheks, diabetic diet.   Could benefit from SGLT 2 inhibitors on discharge, latest POC glucose of 197.  Goals of care full code at this time.    Debility, deconditioning.  Physical therapy has seen the patient and recommends home health PT on discharge.  DVT prophylaxis: Lovenox subcu  Code Status: Full code  Family Communication:  Spoke with the  patient's mother at bedside.   Status is: Inpatient  Remains inpatient appropriate because:IV treatments appropriate due to intensity of illness or inability to take PO and Inpatient level of care appropriate due to severity of illness, decompensated heart failure requiring  IV  diuresis, metabolic encephalopathy, COPD exacerbation and hypercarbia requiring BiPAP,   Dispo: The patient is from: Home              Anticipated d/c is to: Home with home health as per PT but unlikely to obtain due to insurance, have referred for ambulatory physical therapy.  Patient lives by himself at home and his mother checks on him.              Anticipated d/c date is: 1 to 2 days, likely by tomorrow.  Follow cardiology recommendations.  Plan for discharge with trilogy.              Patient currently is not medically stable to d/c.    Consultants:  Cardiology  Pulmonary.  Procedures:  BiPAP  Antibiotics:  . Levaquin 12/31>1/4  Anti-infectives (From admission, onward)   Start     Dose/Rate Route Frequency Ordered Stop   12/22/20 1230  levofloxacin (LEVAQUIN) tablet 500 mg        500 mg Oral Daily 12/22/20 1127 12/24/20 0845   12/20/20 1500  levofloxacin (LEVAQUIN) IVPB 500 mg  Status:  Discontinued        500 mg 100 mL/hr over 60 Minutes Intravenous Every 24 hours 12/20/20 1340 12/22/20 1127     Subjective: Today, patient was seen and examined at bedside.  Patient denies any pain in the chest, , dyspnea, fever or chills.  Has been diuresing well.  Denies any nausea vomiting abdominal pain.   Objective: Vitals:   12/24/20 0720 12/24/20 0854  BP:    Pulse:    Resp:    Temp:    SpO2: 92% 92%    Intake/Output Summary (Last 24 hours) at 12/24/2020 1542 Last data filed at 12/24/2020 1000 Gross per 24 hour  Intake -  Output 1925 ml  Net -1925 ml   Filed Weights   12/22/20 0800 12/24/20 0513 12/24/20 1000  Weight: 103.3 kg 104.6 kg 97 kg   Body mass index is 30.68 kg/m.   Physical Exam:  General: Obese built, not in obvious distress, alert awake and oriented on nasal cannula HENT:   No scleral pallor or icterus noted. Oral mucosa is moist.  Chest:  Clear breath sounds.  Diminished breath sounds bilaterally. CVS: S1 &S2 heard. No murmur.  Regular rate and  rhythm. Abdomen: Soft, nontender, nondistended.  Bowel sounds are heard.   Extremities: No cyanosis, clubbing with trace pedal edema.  Wrinkling of the bilateral lower extremities noted.  Chronic venous insufficiency.   Psych: Alert, awake and oriented, normal mood CNS:  No cranial nerve deficits.  Power equal in all extremities.   Skin: Warm and dry.  No rashes noted.   Data Review: I have personally reviewed the following laboratory data and studies,  CBC: Recent Labs  Lab 12/18/20 0459 12/19/20 0519 12/20/20 0529 12/22/20 0245 12/23/20 0330  WBC 12.1* 10.6* 8.7 7.3 8.2  HGB 17.2* 16.7 17.1* 17.3* 17.9*  HCT 54.4* 54.0* 55.5* 56.0* 56.2*  MCV 104.2* 106.1* 105.7* 104.5* 102.7*  PLT 142* 129* 140* 136* 176   Basic Metabolic Panel: Recent Labs  Lab 12/19/20 0519 12/20/20 0529 12/21/20 0557 12/22/20 0245 12/23/20 0330 12/24/20 0411  NA 139 140 142 143 141  --  K 4.0 4.5 4.6 4.0 3.5  --   CL 93* 90* 89* 83* 82*  --   CO2 34* 38* 43* 45* 45*  --   GLUCOSE 126* 131* 122* 117* 132*  --   BUN 27* 36* 38* 38* 38*  --   CREATININE 1.12 1.02 0.95 0.94 0.96 1.01  CALCIUM 8.8* 8.7* 8.9 9.2 9.1  --   MG 1.8 1.9 2.1 2.2 2.3  --   PHOS 4.0 3.8 4.0 3.0 3.3  --    Liver Function Tests: Recent Labs  Lab 12/18/20 0459 12/19/20 0519 12/20/20 0529 12/21/20 0557  AST 24 27 28 24   ALT 23 23 24 23   ALKPHOS 73 70 62 53  BILITOT 1.7* 1.2 0.7 1.0  PROT 6.9 6.8 6.9 6.3*  ALBUMIN 3.8 3.6 3.6 3.3*   No results for input(s): LIPASE, AMYLASE in the last 168 hours. No results for input(s): AMMONIA in the last 168 hours. Cardiac Enzymes: No results for input(s): CKTOTAL, CKMB, CKMBINDEX, TROPONINI in the last 168 hours. BNP (last 3 results) Recent Labs    01/11/20 1444 12/17/20 1134  BNP 267.3* 1,374.9*    ProBNP (last 3 results) No results for input(s): PROBNP in the last 8760 hours.  CBG: Recent Labs  Lab 12/23/20 1542 12/23/20 1621 12/23/20 2111 12/24/20 0730  12/24/20 1140  GLUCAP 107* 71 264* 114* 197*   Recent Results (from the past 240 hour(s))  Resp Panel by RT-PCR (Flu A&B, Covid) Nasopharyngeal Swab     Status: None   Collection Time: 12/17/20 11:34 AM   Specimen: Nasopharyngeal Swab; Nasopharyngeal(NP) swabs in vial transport medium  Result Value Ref Range Status   SARS Coronavirus 2 by RT PCR NEGATIVE NEGATIVE Final    Comment: (NOTE) SARS-CoV-2 target nucleic acids are NOT DETECTED.  The SARS-CoV-2 RNA is generally detectable in upper respiratory specimens during the acute phase of infection. The lowest concentration of SARS-CoV-2 viral copies this assay can detect is 138 copies/mL. A negative result does not preclude SARS-Cov-2 infection and should not be used as the sole basis for treatment or other patient management decisions. A negative result may occur with  improper specimen collection/handling, submission of specimen other than nasopharyngeal swab, presence of viral mutation(s) within the areas targeted by this assay, and inadequate number of viral copies(<138 copies/mL). A negative result must be combined with clinical observations, patient history, and epidemiological information. The expected result is Negative.  Fact Sheet for Patients:  EntrepreneurPulse.com.au  Fact Sheet for Healthcare Providers:  IncredibleEmployment.be  This test is no t yet approved or cleared by the [place] FDA and  has been authorized for detection and/or diagnosis of SARS-CoV-2 by FDA under an Emergency Use Authorization (EUA). This EUA will remain  in effect (meaning this test can be used) for the duration of the COVID-19 declaration under Section 564(b)(1) of the Act, 21 U.S.C.section 360bbb-3(b)(1), unless the authorization is terminated  or revoked sooner.       Influenza A by PCR NEGATIVE NEGATIVE Final   Influenza B by PCR NEGATIVE NEGATIVE Final    Comment: (NOTE) The Xpert Xpress  SARS-CoV-2/FLU/RSV plus assay is intended as an aid in the diagnosis of influenza from Nasopharyngeal swab specimens and should not be used as a sole basis for treatment. Nasal washings and aspirates are unacceptable for Xpert Xpress SARS-CoV-2/FLU/RSV testing.  Fact Sheet for Patients: EntrepreneurPulse.com.au  Fact Sheet for Healthcare Providers: IncredibleEmployment.be  This test is not yet approved or cleared by the [place]  FDA and has been authorized for detection and/or diagnosis of SARS-CoV-2 by FDA under an Emergency Use Authorization (EUA). This EUA will remain in effect (meaning this test can be used) for the duration of the COVID-19 declaration under Section 564(b)(1) of the Act, 21 U.S.C. section 360bbb-3(b)(1), unless the authorization is terminated or revoked.  Performed at [facility], [place] [address], [place], [place] [address]   MRSA PCR Screening     Status: None   Collection Time: 12/20/20  4:01 PM   Specimen: Nasal Mucosa; Nasopharyngeal  Result Value Ref Range Status   MRSA by PCR NEGATIVE NEGATIVE Final    Comment:        The GeneXpert MRSA Assay (FDA approved for NASAL specimens only), is one component of a comprehensive MRSA colonization surveillance program. It is not intended to diagnose MRSA infection nor to guide or monitor treatment for MRSA infections. Performed at [facility], [place] [address] [place], [place] [address]      Studies: No results found.    Flora Lipps, MD  [facility] 12/24/2020  If 7PM-7AM, please contact night-coverage

## 2020-12-24 NOTE — TOC Progression Note (Signed)
Transition of Care [facility]) - Progression Note    Patient Details  Name: Robert Church MRN: [id number] Date of Birth: 03/05/1956  Transition of Care [facility]) CM/SW Contact  Geni Bers, RN Phone Number: 12/24/2020, 2:38 PM  Clinical Narrative:    Spoke with pt and mother at bedside. Unable to find an agency to service pt at home related to insurance. Pt agreed to out pt PT and will need order.    Expected Discharge Plan: Home/Self Care Barriers to Discharge: Continued Medical Work up  Expected Discharge Plan and Services Expected Discharge Plan: Home/Self Care   Discharge Planning Services: CM Consult   Living arrangements for the past 2 months: Single Family Home                 DME Arranged: Ventilator DME Agency: [employer] Date DME Agency Contacted: 12/23/20 Time DME Agency Contacted: [phone] Representative spoke with at DME Agency: Ian Malkin             Social Determinants of Health (SDOH) Interventions    Readmission Risk Interventions No flowsheet data found.

## 2020-12-24 NOTE — TOC Progression Note (Signed)
Transition of Care [facility]) - Progression Note    Patient Details  Name: Robert Church MRN: [id number] Date of Birth: 19-May-1956  Transition of Care [facility]) CM/SW Contact  Geni Bers, RN Phone Number: 12/24/2020, 1:14 PM  Clinical Narrative:    Checking with [facility] agencies for HHRN/PT. At present time none are taking [employer].    Expected Discharge Plan: Home/Self Care Barriers to Discharge: Continued Medical Work up  Expected Discharge Plan and Services Expected Discharge Plan: Home/Self Care   Discharge Planning Services: CM Consult   Living arrangements for the past 2 months: Single Family Home                 DME Arranged: [facility]ator DME Agency: [employer] Date DME Agency Contacted: 12/23/20 Time DME Agency Contacted: [phone] Representative spoke with at DME Agency: Ian Malkin             Social Determinants of Health (SDOH) Interventions    Readmission Risk Interventions No flowsheet data found.

## 2020-12-25 DIAGNOSIS — J9601 Acute respiratory failure with hypoxia: Secondary | ICD-10-CM | POA: Diagnosis not present

## 2020-12-25 DIAGNOSIS — I5021 Acute systolic (congestive) heart failure: Secondary | ICD-10-CM | POA: Diagnosis not present

## 2020-12-25 DIAGNOSIS — I5042 Chronic combined systolic (congestive) and diastolic (congestive) heart failure: Secondary | ICD-10-CM

## 2020-12-25 DIAGNOSIS — K219 Gastro-esophageal reflux disease without esophagitis: Secondary | ICD-10-CM

## 2020-12-25 DIAGNOSIS — E8770 Fluid overload, unspecified: Secondary | ICD-10-CM | POA: Diagnosis not present

## 2020-12-25 DIAGNOSIS — J449 Chronic obstructive pulmonary disease, unspecified: Secondary | ICD-10-CM

## 2020-12-25 DIAGNOSIS — G4733 Obstructive sleep apnea (adult) (pediatric): Secondary | ICD-10-CM

## 2020-12-25 DIAGNOSIS — E785 Hyperlipidemia, unspecified: Secondary | ICD-10-CM

## 2020-12-25 DIAGNOSIS — I509 Heart failure, unspecified: Secondary | ICD-10-CM | POA: Diagnosis not present

## 2020-12-25 LAB — COMPREHENSIVE METABOLIC PANEL
ALT: 40 U/L (ref 0–44)
AST: 40 U/L (ref 15–41)
Albumin: 3.5 g/dL (ref 3.5–5.0)
Alkaline Phosphatase: 44 U/L (ref 38–126)
Anion gap: 13 (ref 5–15)
BUN: 39 mg/dL — ABNORMAL HIGH (ref 8–23)
CO2: 43 mmol/L — ABNORMAL HIGH (ref 22–32)
Calcium: 8.5 mg/dL — ABNORMAL LOW (ref 8.9–10.3)
Chloride: 81 mmol/L — ABNORMAL LOW (ref 98–111)
Creatinine, Ser: 1.14 mg/dL (ref 0.61–1.24)
GFR, Estimated: >60 mL/min (ref >60)
Glucose, Bld: 112 mg/dL — ABNORMAL HIGH (ref 70–99)
Potassium: 3.7 mmol/L (ref 3.5–5.1)
Sodium: 137 mmol/L (ref 135–145)
Total Bilirubin: 1.7 mg/dL — ABNORMAL HIGH (ref 0.3–1.2)
Total Protein: 6 g/dL — ABNORMAL LOW (ref 6.5–8.1)

## 2020-12-25 LAB — CBC
HCT: 55.6 % — ABNORMAL HIGH (ref 39.0–52.0)
Hemoglobin: 17.7 g/dL — ABNORMAL HIGH (ref 13.0–17.0)
MCH: 32.8 pg (ref 26.0–34.0)
MCHC: 31.8 g/dL (ref 30.0–36.0)
MCV: 103.2 fL — ABNORMAL HIGH (ref 80.0–100.0)
Platelets: 150 10*3/uL (ref 150–400)
RBC: 5.39 MIL/uL (ref 4.22–5.81)
RDW: 12.4 % (ref 11.5–15.5)
WBC: 8.7 10*3/uL (ref 4.0–10.5)
nRBC: 0 % (ref 0.0–0.2)

## 2020-12-25 LAB — PHOSPHORUS: Phosphorus: 3.3 mg/dL (ref 2.5–4.6)

## 2020-12-25 LAB — MAGNESIUM: Magnesium: 2.3 mg/dL (ref 1.7–2.4)

## 2020-12-25 LAB — GLUCOSE, CAPILLARY
Glucose-Capillary: 117 mg/dL — ABNORMAL HIGH (ref 70–99)
Glucose-Capillary: 195 mg/dL — ABNORMAL HIGH (ref 70–99)
Glucose-Capillary: 145 mg/dL — ABNORMAL HIGH (ref 70–99)
Glucose-Capillary: 182 mg/dL — ABNORMAL HIGH (ref 70–99)

## 2020-12-25 MED ORDER — TORSEMIDE 20 MG PO TABS
40.0000 mg | ORAL_TABLET | Freq: Two times a day (BID) | ORAL | Status: DC
  Administered 2020-12-26 – 2020-12-27 (×4): 40 mg via ORAL
  Filled 2020-12-25 (×6): qty 2

## 2020-12-25 MED ORDER — AMLODIPINE BESYLATE 5 MG PO TABS
5.0000 mg | ORAL_TABLET | Freq: Every day | ORAL | Status: DC
Start: 2020-12-26 — End: 2020-12-25

## 2020-12-25 MED ORDER — AMLODIPINE BESYLATE 5 MG PO TABS
2.5000 mg | ORAL_TABLET | Freq: Every day | ORAL | Status: DC
Start: 2020-12-26 — End: 2020-12-26
  Administered 2020-12-26: 2.5 mg via ORAL
  Filled 2020-12-25: qty 1

## 2020-12-25 NOTE — Progress Notes (Addendum)
Progress Note  Patient Name: Robert Church Date of Encounter: 12/25/2020  [facility] Cardiologist: Quay Burow, MD  [facility]  Dr. Haroldine Laws  Subjective   No chest pain or SOB sitting up in chair.  They want to know with MD to call for swelling, will add to discharge instructions. Pt not sure what diuretic he was on as outpt  Inpatient Medications    Scheduled Meds: . allopurinol  300 mg Oral Daily  . amLODipine  2.5 mg Oral Daily  . chlorhexidine  15 mL Mouth Rinse BID  . Chlorhexidine Gluconate Cloth  6 each Topical Daily  . enoxaparin (LOVENOX) injection  50 mg Subcutaneous Q24H  . ezetimibe  10 mg Oral Daily  . furosemide  80 mg Intravenous TID PC  . insulin aspart  0-9 Units Subcutaneous TID WC  . losartan  100 mg Oral Daily  . mouth rinse  15 mL Mouth Rinse q12n4p  . mometasone-formoterol  2 puff Inhalation BID  . montelukast  10 mg Oral QHS  . nicotine  21 mg Transdermal Daily  . pantoprazole  40 mg Oral BID  . predniSONE  10 mg Oral Q breakfast  . roflumilast  500 mcg Oral Daily  . spironolactone  25 mg Oral Daily  . umeclidinium bromide  1 puff Inhalation Daily   Continuous Infusions:  PRN Meds: acetaminophen **OR** acetaminophen, albuterol, alum & mag hydroxide-simeth, LORazepam, nabumetone, ondansetron **OR** ondansetron (ZOFRAN) IV, traMADol   Vital Signs    Vitals:   12/24/20 2023 12/24/20 2221 12/25/20 0539 12/25/20 0817  BP: 98/67  (!) 152/133 (!) 151/136  Pulse: 86  85 78  Resp: 16 18 18    Temp: 97.9 F (36.6 C)  97.8 F (36.6 C)   TempSrc: Oral     SpO2: 93%  92%   Weight:      Height:        Intake/Output Summary (Last 24 hours) at 12/25/2020 0957 Last data filed at 12/24/2020 1000 Gross per 24 hour  Intake --  Output 700 ml  Net -700 ml   Last 3 Weights 12/24/2020 12/24/2020 12/22/2020  Weight (lbs) 213 lb 13.5 oz 230 lb 9.6 oz 227 lb 11.2 oz  Weight (kg) 97 kg 104.6 kg 103.284 kg      Telemetry    SR - Personally Reviewed  ECG     No new - Personally Reviewed  Physical Exam   GEN: No acute distress.   Neck: No JVD Cardiac: RRR, no murmurs, rubs, or gallops.  Respiratory: Clear to mildly diminished to auscultation bilaterally. GI: Soft, nontender, non-distended  MS: No edema; No deformity. Neuro:  Nonfocal  Psych: Normal affect   Labs    High Sensitivity Troponin:   Recent Labs  Lab 12/17/20 1134 12/17/20 1305  TROPONINIHS 26* 27*      Chemistry Recent Labs  Lab 12/20/20 0529 12/21/20 0557 12/22/20 0245 12/23/20 0330 12/24/20 0411 12/25/20 0613  NA 140 142 143 141  --  137  K 4.5 4.6 4.0 3.5  --  3.7  CL 90* 89* 83* 82*  --  81*  CO2 38* 43* 45* 45*  --  43*  GLUCOSE 131* 122* 117* 132*  --  112*  BUN 36* 38* 38* 38*  --  39*  CREATININE 1.02 0.95 0.94 0.96 1.01 1.14  CALCIUM 8.7* 8.9 9.2 9.1  --  8.5*  PROT 6.9 6.3*  --   --   --  6.0*  ALBUMIN 3.6 3.3*  --   --   --  3.5  AST 28 24  --   --   --  40  ALT 24 23  --   --   --  40  ALKPHOS 62 53  --   --   --  44  BILITOT 0.7 1.0  --   --   --  1.7*  GFRNONAA >60 >60 >60 >60 >60 >60  ANIONGAP 12 10 15 14   --  13     Hematology Recent Labs  Lab 12/22/20 0245 12/23/20 0330 12/25/20 0613  WBC 7.3 8.2 8.7  RBC 5.36 5.47 5.39  HGB 17.3* 17.9* 17.7*  HCT 56.0* 56.2* 55.6*  MCV 104.5* 102.7* 103.2*  MCH 32.3 32.7 32.8  MCHC 30.9 31.9 31.8  RDW 12.1 12.3 12.4  PLT 136* 176 150    BNPNo results for input(s): BNP, PROBNP in the last 168 hours.   DDimer  Recent Labs  Lab 12/19/20 0954  DDIMER 1.98*     Radiology    No results found.  Cardiac Studies   Echo 12/18/20 1. Left ventricular ejection fraction, by estimation, is 40 to 45%. The  left ventricle has mildly decreased function. The left ventricle  demonstrates global hypokinesis. The left ventricular internal cavity size  was moderately dilated. There is mild  left ventricular hypertrophy. Left ventricular diastolic parameters are  consistent with Grade II  diastolic dysfunction (pseudonormalization).  Elevated left atrial pressure.  2. Right ventricular systolic function is mildly reduced. The right  ventricular size is severely enlarged. There is moderately elevated  pulmonary artery systolic pressure. The estimated right ventricular  systolic pressure is 123456 mmHg.  3. Left atrial size was mildly dilated.  4. Right atrial size was mildly dilated.  5. The mitral valve is normal in structure. No evidence of mitral valve  regurgitation.  6. The aortic valve was not well visualized. Aortic valve regurgitation  is not visualized. No aortic stenosis is present.  7. There is mild dilatation of the ascending aorta, measuring 37 mm.  8. The inferior vena cava is dilated in size with <50% respiratory  variability, suggesting right atrial pressure of 15 mmHg.   Cath 2018:  Normal coronary arteries.  Global hypokinesis, EF approximately 40%. LVEDP 18 mmHg.   RECOMMENDATIONS:   Sleep study to rule out obstructive sleep apnea.  Guideline mandated heart failure therapy.  CTA 2019: FINDINGS: A 120 kV prospective scan was triggered in the descending thoracic aorta at 111 HU's. Axial non-contrast 3 mm slices were carried out through the heart. The data set was analyzed on a dedicated work station and scored using the [place]. Gantry rotation speed was 250 msecs and collimation was .6 mm. No beta blockade and 0.8 mg of sl NTG was given. The 3D data set was reconstructed in 5% intervals of the 67-82 % of the R-R cycle. Diastolic phases were analyzed on a dedicated work station using MPR, MIP and VRT modes. The patient received 80 cc of contrast.  Aorta: Normal size. No calcifications. No dissection.  Aortic Valve: Trileaflet. No calcifications.  Coronary Arteries: Normal coronary origin. Right dominance.  RCA is a large dominant artery that gives rise to PDA and PLVB. There is minimal calcified  plaque.  Left main is a large artery that gives rise to LAD and LCX arteries. Left main has no plaque.  LAD is a large vessel that gives rise to one diagonal artery. There is mild calcified plaque in the proximal and mid portion with associated stenosis 25-50%.  LCX is a non-dominant artery that gives rise to one large OM1 branch. There is no plaque.  Other findings:  Normal pulmonary vein drainage into the left atrium.  Normal let atrial appendage without a thrombus.  IMPRESSION: 1. Coronary calcium score of 148. This was 59 percentile for age and sex matched control.  2. Normal coronary origin with right dominance.  3. The study is affected significantly by motion. However, in the visualized portions of coronary arteries there is only mild non-obstructive plaque. Aggressive risk factor modification is recommended  4. Dilated pulmonary artery measuring 34 mm suggestive of pulmonary hypertension.  Patient Profile     65 y.o. male with PMH ofchronic systolic and diastolic heart failure, nonischemic cardiomyopathy, COPD, tobacco abuse, alcohol abuse, OSA noncompliant with CPAP, chronic venous insufficiency who is being followed in consultation for the management of heart failure.  Assessment & Plan    1. Acute on chronic combined CHF: patient presented with significant LE edema and SOB. Followed by Dr. Gala Romney outpatient for NICM. BNP 1379 and CXR with pulmonary vascular congestion without edema or consolidation.  Echo this admission with EF 40-45%, global hypokinesis, and G2DD. He was started on IV lasix 80mg  TID with UOP is negative 16,200 since admit and wt today 213.4 lbs down from 261.58 lbs on admit.  Loss of 48 lbs - COPD has limited BBlocker options and he did not tolerate bisoprolol 2/2 fatigue - Continue IV lasix - anticipate transition to po in the next 24-48 hours - Continue losartan - will need to work on assistance coverage for outpatient as  his insurance does not cover costs.  - Continue spironolactone (started this admission) - Will ask CM to check cost of SGLT2-inhibitor - Continue to monitor strict I&Os and daily weights - Continue to monitor electrolytes closely and replete as needed to maintain K >4, Mg >2 - Cr 1.14 stable but up from admit at 0.95 to 1.12 consider changing to po lasix today Or back to torsemide as outpt was on 40 mg daily.  Though he is not sure if he was on torsemide or lasix  2. Acute hypercapneic respiratory failure in patient with COPD and OSA not compliant with CPAP: required BiPAP this admission, now weaned to BiPAP qHS. Importance of compliance with CPAP at home was stressed.  - Continue BiPAP qHS this admission and resume CPAP at home - Continue steroids/inhalers per primary team - Continue to encourage smoking cessation, though patient already reports looking forward to smoking a cigarette upon discharge  3. HTN: BP stable with addition of spironolactone yesterday. Also started on hydralazine this admission though concerns with compliance going forward given TID dosing.  - Continue losartan and spironolactone - May consider transition from hydralazine to amlodipine for BP control to improve compliance  4. HLD: intolerant to statins - continue zetia  5. DM type 2: A1C 6.6 11/2020.  - Will ask CM to check cost of SGLT2-inhibitors - Continue management per primary team  6. Tobacco abuse on nicoderm per IM continue         For questions or updates, please contact [facility] Please consult [email or web address] for contact info under        Signed, 12/2020, NP  12/25/2020, 9:57 AM    Patient seen and examined and agree with 02/22/2021, NP as detailed above.  In brief, the patient is a 65 y.o.malewith PMH ofchronic systolic and diastolic heart failure, nonischemic cardiomyopathy, COPD, tobacco abuse, alcohol abuse, OSA noncompliant with  CPAP, chronic venous insufficiency who  presented with acute on chronic heart failure for which Cardiology has been consulted.  Has diuresed well with lasix 80mg  IV TID and appears significantly improved from a volume status. Cr 1.1 today. Will need continued home oxygen as well as BiPAP vs CPAP at home.   GEN: No acute distress.   Neck: No JVD Cardiac: RRR, no murmurs, rubs, or gallops.  Respiratory: Mild expiratory wheezing. Otherwise clear GI: Soft, nontender, non-distended  MS: No significant edema in LE. Dependent rubor and chronic venous insufficiency Neuro:  Nonfocal  Psych: Normal affect   Plan: -Transition to torsemide 40mg  BID  -Continue spironolactone -Continue losartan 100mg  daily (entresto not covered by insurance; will continue to work-on as out-patient) -Increase amlodipine to 5mg  daily -CM to look into cost of SGLT-2 inhibitor -Needs either CPAP or BiPAP set-up at home prior to discharge as high risk of rehospitalization if not ready upon discharge -Continue zetia as intolerant to statins -Requesting social work consult for financial assistance -Will need close follow-up with HF team on discharge; discussed the importance of low Na diet, daily weights, avoiding alcohol, and compliance with medications in order to help prevent a recurrent HFrEF exacerbation  Plan discussed with the patient and his mother at length at bedside.  Total time of encounter: 45 minutes total time of encounter, including 30 minutes spent in face-to-face patient care on the date of this encounter. This time includes coordination of care and counseling regarding above mentioned problem list. Remainder of non-face-to-face time involved reviewing chart documents/testing relevant to the patient encounter and documentation in the medical record. I have independently reviewed documentation from referring provider.   Gwyndolyn Kaufman, MD [place]

## 2020-12-25 NOTE — Plan of Care (Signed)
  Problem: Education: Goal: Knowledge of General Education information will improve Description: Including pain rating scale, medication(s)/side effects and non-pharmacologic comfort measures Outcome: Progressing   Problem: Clinical Measurements: Goal: Ability to maintain clinical measurements within normal limits will improve Outcome: Progressing   

## 2020-12-25 NOTE — Progress Notes (Signed)
[facility]  PROGRESS NOTE  Robert Church NWG:[id number] DOB: 02-Apr-1956 DOA: 12/17/2020 PCP: [name], MD Admit date - 12/17/2020   Admitting Physician Teddy Spike, DO  Outpatient Primary MD for the patient is [name], MD  LOS - 8 Brief Narrative   Robert Church is a 65 y.o. year old male with medical history significant for Chronic systolic and diastolic CHF, nonischemic cardiomyopathy, COPD, OSA with nonadherence to CPAP, chronic venous insufficiency, alcohol/tobacco abuse who presented with cough, congestion and difficulty breathing was found to have acute hypoxic respiratory failure secondary to CHF exacerbation evident with elevated BNP and signs of pulmonary vascular congestion on chest x-ray as well as acute hypercarbic respiratory failure secondary to duration of COPD/OSA (PCO2 of 101 on ABG on admission). He required transfer to the ICU during hospital course due to need for persistent BiPAP use for acute hypercapnic respiratory failure while also being diuresed for his CHF with IV Lasix.  Subjective  Feels breathing continues to improve. Denies any chest pain. No fevers or chills.  A & P   Acute on chronic hypercapnic respiratory failure secondary to exacerbation of COPD and complicated by underlying OSA with poor adherence to CPAP. -Tolerating nightly BiPAP -Respiratory status stable on 6 L -At extremely high risk of repeat decompensation given multiple risk factors including chronic hypoxia, chronic lung disease, chronic heart disease with known pulmonary hypertension for which pulmonology is recommending patient have trelogy vent -We will need to remain inpatient until sure trelogy vent in place at home,  Acute hypoxic respiratory failure, secondary to COPD/CHF/OSA exacerbation. Stable. Euvolemic now, with no wheezing and tolerating BiPAP nightly. With ambulation require 6 L to maintain oxygen saturation greater than 84% with exertion -We will  need trilogy vent set up at home on discharge -Transitioning from IV Lasix to oral torsemide as recommended by cardiology -Continue inhalers/steroids per home regimen -Currently on 6 L, will provide O2 on discharge  COPD, stable. No wheezing on exam. -Smoking cessation emphasized during admission as patient remains an active smoker -Continue scheduled inhalers and home dose prednisone -Resume home breztri at discharge  Acute on chronic combined diastolic CHF. Euvolemic currently, net -16 L since admission, current weight 213 pounds (1261 pounds on admission) -Transitioning from IV Lasix to torsemide 40 mg twice daily per cardiology -Continue losartan, spironolactone, amlodipine -Will need close follow-up with heart failure team at discharge -Emphasize importance of low-salt, daily weights, close monitoring of fluids on discharge with patient and his mother  Hyperlipidemia -Continue Zetia given statin intolerance  GERD, stable-continue PPI  Gout, stable-continue allopurinol    Family Communication  : Mother updated at bedside  Code Status : Full  Disposition Plan  :  Patient is from home. Anticipated d/c date: 2 to 3 days. Barriers to d/c or necessity for inpatient status:  Awaiting trilogy vent as patient will need this to help with persistent hypercarbia and prevent any decompensation on discharge, also monitoring fluid status with transition to oral diuresis Consults  : Cardiology, PCCM  Procedures  : TTE, 12/18/2020  DVT Prophylaxis  :  Lovenox  MDM: The below labs and imaging reports were reviewed and summarized above.  Medication management as above.  Lab Results  Component Value Date   PLT 150 12/25/2020    Diet :  Diet Order            Diet heart healthy/carb modified Room service appropriate? Yes; Fluid consistency: Thin; Fluid restriction: 1200 mL Fluid  Diet effective now                  Inpatient Medications Scheduled Meds: . allopurinol  300 mg Oral  Daily  . amLODipine  2.5 mg Oral Daily  . chlorhexidine  15 mL Mouth Rinse BID  . Chlorhexidine Gluconate Cloth  6 each Topical Daily  . enoxaparin (LOVENOX) injection  50 mg Subcutaneous Q24H  . ezetimibe  10 mg Oral Daily  . insulin aspart  0-9 Units Subcutaneous TID WC  . losartan  100 mg Oral Daily  . mouth rinse  15 mL Mouth Rinse q12n4p  . mometasone-formoterol  2 puff Inhalation BID  . montelukast  10 mg Oral QHS  . nicotine  21 mg Transdermal Daily  . pantoprazole  40 mg Oral BID  . predniSONE  10 mg Oral Q breakfast  . roflumilast  500 mcg Oral Daily  . spironolactone  25 mg Oral Daily  . torsemide  40 mg Oral BID  . umeclidinium bromide  1 puff Inhalation Daily   Continuous Infusions: PRN Meds:.acetaminophen **OR** acetaminophen, albuterol, alum & mag hydroxide-simeth, LORazepam, nabumetone, ondansetron **OR** ondansetron (ZOFRAN) IV, traMADol  Antibiotics  :   Anti-infectives (From admission, onward)   Start     Dose/Rate Route Frequency Ordered Stop   12/22/20 1230  levofloxacin (LEVAQUIN) tablet 500 mg        500 mg Oral Daily 12/22/20 1127 12/24/20 0845   12/20/20 1500  levofloxacin (LEVAQUIN) IVPB 500 mg  Status:  Discontinued        500 mg 100 mL/hr over 60 Minutes Intravenous Every 24 hours 12/20/20 1340 12/22/20 1127       Objective   Vitals:   12/25/20 0539 12/25/20 0817 12/25/20 1211 12/25/20 1339  BP: (!) 152/133 (!) 151/136 98/65 (!) 81/58  Pulse: 85 78 89 84  Resp: 18  18 18   Temp: 97.8 F (36.6 C)  98.1 F (36.7 C) 97.7 F (36.5 C)  TempSrc:   Oral Oral  SpO2: 92%  94% 93%  Weight:      Height:        SpO2: 93 % O2 Flow Rate (L/min): 6 L/min FiO2 (%): 40 %  Wt Readings from Last 3 Encounters:  12/24/20 97 kg  12/12/20 116.1 kg  12/05/20 116.1 kg     Intake/Output Summary (Last 24 hours) at 12/25/2020 1525 Last data filed at 12/25/2020 1344 Gross per 24 hour  Intake 480 ml  Output 500 ml  Net -20 ml    Physical Exam:   Awake  Alert, Oriented X 3, Normal affect No new F.N deficits,  [place].AT, Normal respiratory effort on 6 L, CTAB RRR,No Gallops,Rubs or new Murmurs, no peripheral edema +ve B.Sounds, Abd Soft, No tenderness, No rebound, guarding or rigidity. No Cyanosis, No new Rash or bruise     I have personally reviewed the following:   Data Reviewed:  CBC Recent Labs  Lab 12/19/20 0519 12/20/20 0529 12/22/20 0245 12/23/20 0330 12/25/20 0613  WBC 10.6* 8.7 7.3 8.2 8.7  HGB 16.7 17.1* 17.3* 17.9* 17.7*  HCT 54.0* 55.5* 56.0* 56.2* 55.6*  PLT 129* 140* 136* 176 150  MCV 106.1* 105.7* 104.5* 102.7* 103.2*  MCH 32.8 32.6 32.3 32.7 32.8  MCHC 30.9 30.8 30.9 31.9 31.8  RDW 13.1 12.6 12.1 12.3 12.4    Chemistries  Recent Labs  Lab 12/19/20 0519 12/20/20 0529 12/21/20 0557 12/22/20 0245 12/23/20 0330 12/24/20 0411 12/25/20 0613  NA 139 140 142  143 141  --  137  K 4.0 4.5 4.6 4.0 3.5  --  3.7  CL 93* 90* 89* 83* 82*  --  81*  CO2 34* 38* 43* 45* 45*  --  43*  GLUCOSE 126* 131* 122* 117* 132*  --  112*  BUN 27* 36* 38* 38* 38*  --  39*  CREATININE 1.12 1.02 0.95 0.94 0.96 1.01 1.14  CALCIUM 8.8* 8.7* 8.9 9.2 9.1  --  8.5*  MG 1.8 1.9 2.1 2.2 2.3  --  2.3  AST 27 28 24   --   --   --  40  ALT 23 24 23   --   --   --  40  ALKPHOS 70 62 53  --   --   --  44  BILITOT 1.2 0.7 1.0  --   --   --  1.7*   ------------------------------------------------------------------------------------------------------------------ No results for input(s): CHOL, HDL, LDLCALC, TRIG, CHOLHDL, LDLDIRECT in the last 72 hours.  Lab Results  Component Value Date   HGBA1C 6.6 (H) 11/28/2020   ------------------------------------------------------------------------------------------------------------------ No results for input(s): TSH, T4TOTAL, T3FREE, THYROIDAB in the last 72 hours.  Invalid input(s):  FREET3 ------------------------------------------------------------------------------------------------------------------ No results for input(s): VITAMINB12, FOLATE, FERRITIN, TIBC, IRON, RETICCTPCT in the last 72 hours.  Coagulation profile No results for input(s): INR, PROTIME in the last 168 hours.  No results for input(s): DDIMER in the last 72 hours.  Cardiac Enzymes No results for input(s): CKMB, TROPONINI, MYOGLOBIN in the last 168 hours.  Invalid input(s): CK ------------------------------------------------------------------------------------------------------------------    Component Value Date/Time   BNP 1,374.9 (H) 12/17/2020 1134    Micro Results Recent Results (from the past 240 hour(s))  Resp Panel by RT-PCR (Flu A&B, Covid) Nasopharyngeal Swab     Status: None   Collection Time: 12/17/20 11:34 AM   Specimen: Nasopharyngeal Swab; Nasopharyngeal(NP) swabs in vial transport medium  Result Value Ref Range Status   SARS Coronavirus 2 by RT PCR NEGATIVE NEGATIVE Final    Comment: (NOTE) SARS-CoV-2 target nucleic acids are NOT DETECTED.  The SARS-CoV-2 RNA is generally detectable in upper respiratory specimens during the acute phase of infection. The lowest concentration of SARS-CoV-2 viral copies this assay can detect is 138 copies/mL. A negative result does not preclude SARS-Cov-2 infection and should not be used as the sole basis for treatment or other patient management decisions. A negative result may occur with  improper specimen collection/handling, submission of specimen other than nasopharyngeal swab, presence of viral mutation(s) within the areas targeted by this assay, and inadequate number of viral copies(<138 copies/mL). A negative result must be combined with clinical observations, patient history, and epidemiological information. The expected result is Negative.  Fact Sheet for Patients:  EntrepreneurPulse.com.au  Fact Sheet for  Healthcare Providers:  IncredibleEmployment.be  This test is no t yet approved or cleared by the [place] FDA and  has been authorized for detection and/or diagnosis of SARS-CoV-2 by FDA under an Emergency Use Authorization (EUA). This EUA will remain  in effect (meaning this test can be used) for the duration of the COVID-19 declaration under Section 564(b)(1) of the Act, 21 U.S.C.section 360bbb-3(b)(1), unless the authorization is terminated  or revoked sooner.       Influenza A by PCR NEGATIVE NEGATIVE Final   Influenza B by PCR NEGATIVE NEGATIVE Final    Comment: (NOTE) The Xpert Xpress SARS-CoV-2/FLU/RSV plus assay is intended as an aid in the diagnosis of influenza from Nasopharyngeal swab specimens and should not  be used as a sole basis for treatment. Nasal washings and aspirates are unacceptable for Xpert Xpress SARS-CoV-2/FLU/RSV testing.  Fact Sheet for Patients: BloggerCourse.com  Fact Sheet for Healthcare Providers: SeriousBroker.it  This test is not yet approved or cleared by the [place] FDA and has been authorized for detection and/or diagnosis of SARS-CoV-2 by FDA under an Emergency Use Authorization (EUA). This EUA will remain in effect (meaning this test can be used) for the duration of the COVID-19 declaration under Section 564(b)(1) of the Act, 21 U.S.C. section 360bbb-3(b)(1), unless the authorization is terminated or revoked.  Performed at [facility], [address] [place], [place] [address]   MRSA PCR Screening     Status: None   Collection Time: 12/20/20  4:01 PM   Specimen: Nasal Mucosa; Nasopharyngeal  Result Value Ref Range Status   MRSA by PCR NEGATIVE NEGATIVE Final    Comment:        The GeneXpert MRSA Assay (FDA approved for NASAL specimens only), is one component of a comprehensive MRSA colonization surveillance program. It is  not intended to diagnose MRSA infection nor to guide or monitor treatment for MRSA infections. Performed at [facility], [address] [place], [place] [address]     Radiology Reports DG Chest 2 View  Result Date: 12/17/2020 CLINICAL DATA:  Shortness of breath EXAM: CHEST - 2 VIEW COMPARISON:  June 27, 2018 FINDINGS: There is mild left midlung atelectasis. No edema or airspace opacity. There is mild cardiomegaly with a degree of pulmonary venous hypertension. No evident adenopathy. No bone lesions. IMPRESSION: Mild cardiomegaly with pulmonary vascular congestion. No edema or consolidation. Mild left midlung atelectatic change. Electronically Signed   By: Bretta Bang III M.D.   On: 12/17/2020 11:22   ECHOCARDIOGRAM COMPLETE  Result Date: 12/18/2020    ECHOCARDIOGRAM REPORT   Patient Name:   Robert Church Date of Exam: 12/18/2020 Medical Rec #:  [id number]           Height:       70.0 in Accession #:    [id number]          Weight:       255.5 lb Date of Birth:  1956-11-22            BSA:          2.316 m Patient Age:    64 years            BP:           137/94 mmHg Patient Gender: M                   HR:           111 bpm. Exam Location:  Inpatient Procedure: 2D Echo, Color Doppler and Cardiac Doppler Indications:    I50.21 Acute systolic (congestive) heart failure  History:        Patient has prior history of Echocardiogram examinations, most                 recent 11/30/2019. CHF, COPD; Risk Factors:Hypertension,                 Dyslipidemia and Sleep Apnea.  Sonographer:    Irving Burton Senior RDCS Referring Phys: [id number] Teddy Spike  Sonographer Comments: Scanned upright due to dyspnea. IMPRESSIONS  1. Left ventricular ejection fraction, by estimation, is 40 to 45%. The left ventricle has mildly decreased function. The left ventricle demonstrates global  hypokinesis. The left ventricular internal cavity size was moderately dilated. There is mild left ventricular  hypertrophy. Left ventricular diastolic parameters are consistent with Grade II diastolic dysfunction (pseudonormalization). Elevated left atrial pressure.  2. Right ventricular systolic function is mildly reduced. The right ventricular size is severely enlarged. There is moderately elevated pulmonary artery systolic pressure. The estimated right ventricular systolic pressure is 123456 mmHg.  3. Left atrial size was mildly dilated.  4. Right atrial size was mildly dilated.  5. The mitral valve is normal in structure. No evidence of mitral valve regurgitation.  6. The aortic valve was not well visualized. Aortic valve regurgitation is not visualized. No aortic stenosis is present.  7. There is mild dilatation of the ascending aorta, measuring 37 mm.  8. The inferior vena cava is dilated in size with <50% respiratory variability, suggesting right atrial pressure of 15 mmHg. FINDINGS  Left Ventricle: Left ventricular ejection fraction, by estimation, is 40 to 45%. The left ventricle has mildly decreased function. The left ventricle demonstrates global hypokinesis. The left ventricular internal cavity size was moderately dilated. There is mild left ventricular hypertrophy. Left ventricular diastolic parameters are consistent with Grade II diastolic dysfunction (pseudonormalization). Elevated left atrial pressure. Right Ventricle: The right ventricular size is severely enlarged. Right vetricular wall thickness was not assessed. Right ventricular systolic function is mildly reduced. There is moderately elevated pulmonary artery systolic pressure. The tricuspid regurgitant velocity is 2.92 m/s, and with an assumed right atrial pressure of 15 mmHg, the estimated right ventricular systolic pressure is 123456 mmHg. Left Atrium: Left atrial size was mildly dilated. Right Atrium: Right atrial size was mildly dilated. Pericardium: Trivial pericardial effusion is present. Mitral Valve: The mitral valve is normal in structure. No  evidence of mitral valve regurgitation. Tricuspid Valve: The tricuspid valve is normal in structure. Tricuspid valve regurgitation is trivial. Aortic Valve: The aortic valve was not well visualized. Aortic valve regurgitation is not visualized. No aortic stenosis is present. Pulmonic Valve: The pulmonic valve was not well visualized. Pulmonic valve regurgitation is not visualized. Aorta: The aortic root is normal in size and structure. There is mild dilatation of the ascending aorta, measuring 37 mm. Venous: The inferior vena cava is dilated in size with less than 50% respiratory variability, suggesting right atrial pressure of 15 mmHg. IAS/Shunts: The interatrial septum was not well visualized.  LEFT VENTRICLE PLAX 2D LVIDd:         6.00 cm      Diastology LVIDs:         4.40 cm      LV e' medial:  3.48 cm/s LV PW:         1.20 cm      LV e' lateral: 4.13 cm/s LV IVS:        1.20 cm LVOT diam:     2.20 cm LV SV:         63 LV SV Index:   27 LVOT Area:     3.80 cm  LV Volumes (MOD) LV vol d, MOD A2C: 149.0 ml LV vol d, MOD A4C: 140.0 ml LV vol s, MOD A2C: 90.6 ml LV vol s, MOD A4C: 82.9 ml LV SV MOD A2C:     58.4 ml LV SV MOD A4C:     140.0 ml LV SV MOD BP:      59.3 ml RIGHT VENTRICLE RV S prime:     13.90 cm/s TAPSE (M-mode): 2.4 cm LEFT ATRIUM  Index       RIGHT ATRIUM           Index LA diam:      4.20 cm 1.81 cm/m  RA Area:     22.70 cm LA Vol (A2C): 78.6 ml 33.93 ml/m RA Volume:   73.20 ml  31.60 ml/m LA Vol (A4C): 68.3 ml 29.49 ml/m  AORTIC VALVE LVOT Vmax:   112.03 cm/s LVOT Vmean:  84.833 cm/s LVOT VTI:    0.165 m  AORTA Ao Root diam: 3.60 cm Ao Asc diam:  3.70 cm TRICUSPID VALVE TR Peak grad:   34.1 mmHg TR Vmax:        292.00 cm/s  SHUNTS Systemic VTI:  0.16 m Systemic Diam: 2.20 cm Oswaldo Milian MD Electronically signed by Oswaldo Milian MD Signature Date/Time: 12/18/2020/12:52:30 PM    Final    VAS [place] LOWER EXTREMITY VENOUS REFLUX  Result Date: 12/12/2020  Lower Venous  Reflux Study Indications: Pain, Swelling, Edema, and Gout.  Performing Technologist: Delorise Shiner RVT  Examination Guidelines: A complete evaluation includes B-mode imaging, spectral Doppler, color Doppler, and power Doppler as needed of all accessible portions of each vessel. Bilateral testing is considered an integral part of a complete examination. Limited examinations for reoccurring indications may be performed as noted. The reflux portion of the exam is performed with the patient in reverse Trendelenburg. Significant venous reflux is defined as >500 ms in the superficial venous system, and >1 second in the deep venous system.  Venous Reflux Times +--------------+---------+------+-----------+------------+--------+ RIGHT         Reflux NoRefluxReflux TimeDiameter cmsComments                         Yes                                  +--------------+---------+------+-----------+------------+--------+ CFV           no                                             +--------------+---------+------+-----------+------------+--------+ FV prox       no                                             +--------------+---------+------+-----------+------------+--------+ FV mid        no                                             +--------------+---------+------+-----------+------------+--------+ FV dist       no                                             +--------------+---------+------+-----------+------------+--------+ Popliteal     no                                             +--------------+---------+------+-----------+------------+--------+  GSV at [facility]    no                           0.667             +--------------+---------+------+-----------+------------+--------+ GSV prox thighno                           0.637             +--------------+---------+------+-----------+------------+--------+ GSV mid thigh no                           0.584              +--------------+---------+------+-----------+------------+--------+ GSV dist thighno                           0.524             +--------------+---------+------+-----------+------------+--------+ GSV at knee   no                           0.553             +--------------+---------+------+-----------+------------+--------+ GSV prox calf                              0.467             +--------------+---------+------+-----------+------------+--------+ GSV mid calf                               0.445             +--------------+---------+------+-----------+------------+--------+ SSV Pop Fossa no                           0.577             +--------------+---------+------+-----------+------------+--------+ SSV prox calf no                           0.326             +--------------+---------+------+-----------+------------+--------+ SSV mid calf                               0.425             +--------------+---------+------+-----------+------------+--------+  +--------------+---------+------+-----------+------------+--------+ LEFT          Reflux NoRefluxReflux TimeDiameter cmsComments                         Yes                                  +--------------+---------+------+-----------+------------+--------+ CFV           no                                             +--------------+---------+------+-----------+------------+--------+ FV prox       no                                             +--------------+---------+------+-----------+------------+--------+  FV mid        no                                             +--------------+---------+------+-----------+------------+--------+ FV dist       no                                             +--------------+---------+------+-----------+------------+--------+ Popliteal     no                                              +--------------+---------+------+-----------+------------+--------+ GSV at [facility]    no                           0.653             +--------------+---------+------+-----------+------------+--------+ GSV prox thighno                           0.424             +--------------+---------+------+-----------+------------+--------+ GSV mid thigh no                           0.467             +--------------+---------+------+-----------+------------+--------+ GSV dist thighno                           0.504             +--------------+---------+------+-----------+------------+--------+ GSV at knee   no                           0.467             +--------------+---------+------+-----------+------------+--------+ GSV prox calf                              0.361             +--------------+---------+------+-----------+------------+--------+ GSV mid calf                               0.392             +--------------+---------+------+-----------+------------+--------+ SSV Pop Fossa no                           0.744             +--------------+---------+------+-----------+------------+--------+ SSV prox calf no                           0.468             +--------------+---------+------+-----------+------------+--------+ SSV mid calf                               0.394             +--------------+---------+------+-----------+------------+--------+  Summary: Bilateral: - No evidence of deep vein thrombosis seen in the lower extremities, bilaterally, from the common femoral through the popliteal veins. - No evidence of superficial venous thrombosis in the lower extremities, bilaterally. - No evidence of deep venous insufficiency seen bilaterally in the lower extremity. - No evidence of superficial venous reflux seen in the greater saphenous veins bilaterally. - No evidence of superficial venous reflux seen in the short saphenous veins bilaterally.  *See  table(s) above for measurements and observations. Electronically signed by Deitra Mayo MD on 12/12/2020 at 3:40:43 PM.    Final      Time Spent in minutes  30     Desiree Hane M.D on 12/25/2020 at 3:25 PM  To page go to [email or web address] - password [facility]

## 2020-12-25 NOTE — TOC Progression Note (Signed)
Transition of Care [facility]) - Progression Note    Patient Details  Name: Manish Ruggiero MRN: [id number] Date of Birth: [birth year]  [facility] CM/SW Contact  Geni Bers, RN Phone Number: 12/25/2020, 2:00 PM  Clinical Narrative:     Unsafe discharge related to Trilogy will not be delivered until Friday. Oxygen and Trilogy from [employer].   Expected Discharge Plan: Home/Self Care Barriers to Discharge: Continued Medical Work up  Expected Discharge Plan and Services Expected Discharge Plan: Home/Self Care   Discharge Planning Services: CM Consult   Living arrangements for the past 2 months: Single Family Home                 DME Arranged: Ventilator DME Agency: [employer] Date DME Agency Contacted: 12/23/20 Time DME Agency Contacted: [phone] Representative spoke with at DME Agency: Ian Malkin             Social Determinants of Health (SDOH) Interventions    Readmission Risk Interventions No flowsheet data found.

## 2020-12-25 NOTE — Progress Notes (Signed)
PT Cancellation Note  Patient Details Name: Robert Church MRN: [id number] DOB: 18-May-1956   Cancelled Treatment:    Reason Eval/Treat Not Completed: Medical issues which prohibited therapy. BP 81/58, deferred at this time, continue efforts    [facility] 12/25/2020, 2:46 PM

## 2020-12-25 NOTE — Progress Notes (Signed)
SATURATION QUALIFICATIONS: (This note is used to comply with regulatory documentation for home oxygen)  Patient Saturations on Room Air at Rest = 93%  Patient Saturations on Room Air while Ambulating = 84%  Patient Saturations on 6 Liters of oxygen while Ambulating = 92%  Please briefly explain why patient needs home oxygen:Patient desats with activity.

## 2020-12-26 DIAGNOSIS — I5042 Chronic combined systolic (congestive) and diastolic (congestive) heart failure: Secondary | ICD-10-CM | POA: Diagnosis not present

## 2020-12-26 DIAGNOSIS — J449 Chronic obstructive pulmonary disease, unspecified: Secondary | ICD-10-CM | POA: Diagnosis not present

## 2020-12-26 DIAGNOSIS — J9601 Acute respiratory failure with hypoxia: Secondary | ICD-10-CM | POA: Diagnosis not present

## 2020-12-26 DIAGNOSIS — I5021 Acute systolic (congestive) heart failure: Secondary | ICD-10-CM | POA: Diagnosis not present

## 2020-12-26 DIAGNOSIS — I10 Essential (primary) hypertension: Secondary | ICD-10-CM | POA: Diagnosis not present

## 2020-12-26 DIAGNOSIS — E8770 Fluid overload, unspecified: Secondary | ICD-10-CM | POA: Diagnosis not present

## 2020-12-26 DIAGNOSIS — I959 Hypotension, unspecified: Secondary | ICD-10-CM

## 2020-12-26 DIAGNOSIS — I509 Heart failure, unspecified: Secondary | ICD-10-CM | POA: Diagnosis not present

## 2020-12-26 LAB — BASIC METABOLIC PANEL
Anion gap: 13 (ref 5–15)
BUN: 47 mg/dL — ABNORMAL HIGH (ref 8–23)
CO2: 40 mmol/L — ABNORMAL HIGH (ref 22–32)
Calcium: 8.7 mg/dL — ABNORMAL LOW (ref 8.9–10.3)
Chloride: 83 mmol/L — ABNORMAL LOW (ref 98–111)
Creatinine, Ser: 0.97 mg/dL (ref 0.61–1.24)
GFR, Estimated: >60 mL/min (ref >60)
Glucose, Bld: 244 mg/dL — ABNORMAL HIGH (ref 70–99)
Potassium: 4 mmol/L (ref 3.5–5.1)
Sodium: 136 mmol/L (ref 135–145)

## 2020-12-26 LAB — LACTIC ACID, PLASMA: Lactic Acid, Venous: 1.7 mmol/L (ref 0.5–1.9)

## 2020-12-26 LAB — GLUCOSE, CAPILLARY
Glucose-Capillary: 109 mg/dL — ABNORMAL HIGH (ref 70–99)
Glucose-Capillary: 176 mg/dL — ABNORMAL HIGH (ref 70–99)
Glucose-Capillary: 135 mg/dL — ABNORMAL HIGH (ref 70–99)
Glucose-Capillary: 147 mg/dL — ABNORMAL HIGH (ref 70–99)

## 2020-12-26 MED ORDER — SPIRONOLACTONE 12.5 MG HALF TABLET
12.5000 mg | ORAL_TABLET | Freq: Every day | ORAL | Status: DC
Start: 2020-12-27 — End: 2020-12-30
  Administered 2020-12-27 – 2020-12-30 (×4): 12.5 mg via ORAL
  Filled 2020-12-26 (×4): qty 1

## 2020-12-26 NOTE — Progress Notes (Signed)
Physical Therapy Treatment Patient Details Name: Robert Church MRN: [id number] DOB: Jun 07, 1956 Today's Date: 12/26/2020    History of Present Illness Robert Church is a 65 y.o. male with medical history significant of CHF, COPD, HTN  presented to hospital with bilateral leg swelling and abdominal distention.  Patient stated compliance with his heart failure medication but despite that he continued to have increased swelling.  Of note, the patient had seen his primary care physician who had plans to obtain CT abdomen of his chest abdomen and pelvis as outpatient.  In the ED, patient was noted to have there is edema extending up to the abdomen with BNP of 1300+. CXR showed some pulmonary congestion.    PT Comments    Pt ambulated 180' with RW, no loss of balance, 1/4 dyspnea, SaO2 78% while walking (portable O2 tank was set on 6L O2, however at end of walk tank was noted to be nearly empty so unclear if pt was truly receiving 6L). SaO2 88% on 7L at rest after 2 minutes seated rest following ambulation. Instructed pt in seated BUE/LE strengthening exercises to be done independently to minimize deconditioning during hospitalization.    Follow Up Recommendations  Home health PT     Equipment Recommendations  Rolling walker with 5" wheels    Recommendations for Other Services       Precautions / Restrictions Precautions Precautions: Fall Precaution Comments: monitor sats and HR Restrictions Weight Bearing Restrictions: No    Mobility  Bed Mobility               General bed mobility comments: up in recliner  Transfers Overall transfer level: Needs assistance Equipment used: Rolling walker (2 wheeled) Transfers: Sit to/from Stand Sit to Stand: Supervision         General transfer comment: no loss of balance  Ambulation/Gait Ambulation/Gait assistance: Supervision Gait Distance (Feet): 180 Feet Assistive device: Rolling walker (2 wheeled) Gait  Pattern/deviations: Step-through pattern     General Gait Details: steady with RW, no loss of balance, 1/4 dyspnea, SaO2 78% while ambulating (O2 tank set on 6L O2 but at end of walk tank was noted to be nearly empty so unsure if pt was truly receiving 6L), SaO2 94% at rest on 7L O2 [place], 88% on 7L after 2 minutes seated rest after ambulating   Stairs             Wheelchair Mobility    Modified Rankin (Stroke Patients Only)       Balance Overall balance assessment: Mild deficits observed, not formally tested                                          Cognition Arousal/Alertness: Awake/alert Behavior During Therapy: WFL for tasks assessed/performed Overall Cognitive Status: Within Functional Limits for tasks assessed                                        Exercises General Exercises - Lower Extremity Ankle Circles/Pumps: AROM;Both;10 reps;Seated Long Arc Quad: AROM;Both;10 reps;Seated Hip Flexion/Marching: AROM;Both;10 reps;Seated Shoulder Exercises Shoulder Flexion: AROM;Both;10 reps;Seated    General Comments        Pertinent Vitals/Pain      Home Living  Prior Function            PT Goals (current goals can now be found in the care plan section) Acute Rehab PT Goals Patient Stated Goal: to go home, likes to do yardwork PT Goal Formulation: With patient/family Time For Goal Achievement: 01/06/21 Potential to Achieve Goals: Good Progress towards PT goals: Progressing toward goals    Frequency    Min 3X/week      PT Plan Current plan remains appropriate    Co-evaluation              AM-PAC PT "6 Clicks" Mobility   Outcome Measure  Help needed turning from your back to your side while in a flat bed without using bedrails?: None Help needed moving from lying on your back to sitting on the side of a flat bed without using bedrails?: None Help needed moving to and from a bed to a  chair (including a wheelchair)?: A Little Help needed standing up from a chair using your arms (e.g., wheelchair or bedside chair)?: A Little Help needed to walk in hospital room?: A Little Help needed climbing 3-5 steps with a railing? : A Little 6 Click Score: 20    End of Session Equipment Utilized During Treatment: Gait belt;Oxygen Activity Tolerance: Patient tolerated treatment well Patient left: in chair;with call bell/phone within reach;with chair alarm set;with family/visitor present Nurse Communication: Mobility status PT Visit Diagnosis: Unsteadiness on feet (R26.81);Difficulty in walking, not elsewhere classified (R26.2)     Time: 4008-6761 PT Time Calculation (min) (ACUTE ONLY): 18 min  Charges:  $Gait Training: 8-22 mins                    Ralene Bathe Kistler PT 12/26/2020  Acute Rehabilitation Services Pager [phone] Office [phone]

## 2020-12-26 NOTE — TOC Progression Note (Signed)
Transition of Care [facility]) - Progression Note    Patient Details  Name: Robert Church MRN: [id number] Date of Birth: 1956/04/28  [facility] of [facility] CM/SW Contact  Geni Bers, RN Phone Number: 12/26/2020, 10:18 AM  Clinical Narrative:     A call from [facility] with [employer] for pt is here. Will need order for home O2.   Expected Discharge Plan: Home/Self Care Barriers to Discharge: Continued Medical Work up  Expected Discharge Plan and Services Expected Discharge Plan: Home/Self Care   Discharge Planning Services: CM Consult   Living arrangements for the past 2 months: Single Family Home                 DME Arranged: Ventilator DME Agency: [employer] Date DME Agency Contacted: 12/23/20 Time DME Agency Contacted: [phone] Representative spoke with at DME Agency: Ian Malkin             Social Determinants of Health (SDOH) Interventions    Readmission Risk Interventions No flowsheet data found.

## 2020-12-26 NOTE — Progress Notes (Signed)
[facility]  PROGRESS NOTE  [name] [id number] DOB: Apr 14, 1956 DOA: 12/17/2020 PCP: Janith Lima, MD Admit date - 12/17/2020   Admitting Physician Jonnie Finner, DO  Outpatient Primary MD for the patient is Janith Lima, MD  LOS - 9 Brief Narrative   Robert Church is a 65 y.o. year old male with medical history significant for Chronic systolic and diastolic CHF, nonischemic cardiomyopathy, COPD, OSA with nonadherence to CPAP, chronic venous insufficiency, alcohol/tobacco abuse who presented with cough, congestion and difficulty breathing was found to have acute hypoxic respiratory failure secondary to CHF exacerbation evident with elevated BNP and signs of pulmonary vascular congestion on chest x-ray as well as acute hypercarbic respiratory failure secondary to duration of COPD/OSA (PCO2 of 101 on ABG on admission). He required transfer to the ICU during hospital course due to need for persistent BiPAP use for acute hypercapnic respiratory failure while also being diuresed for his CHF with IV Lasix.  Subjective  Denies chest pain.  Feels breathing is stable.  No cough..    A & P   Hypotension.  Started in the last 24 hours with nadir SBP in the 80s that prompted holding his second dose of torsemideAdequate perfusion with normal lactic acid currently SBP's in the 90s. - Cardiology recommends continuing torsemide 40 mg twice daily - Discontinue amlodipine -decrease spironolactone to 12.5 mg daily -continue to closely monitor blood pressure over the next 24 hours  Acute on chronic hypercapnic respiratory failure secondary to exacerbation of COPD and complicated by underlying OSA with poor adherence to CPAP, stable on 6 L. -Tolerating nightly BiPAP -Respiratory status stable on 6 L -At extremely high risk of repeat decompensation given multiple risk factors including chronic hypoxia, chronic lung disease, chronic heart disease with known pulmonary hypertension  for which pulmonology is recommending patient have trelogy vent -We will need to remain inpatient until sure trelogy vent in place at home,  Acute hypoxic respiratory failure, secondary to COPD/CHF/OSA exacerbation. Stable. Euvolemic now, with no wheezing and tolerating BiPAP nightly. With ambulation require 6 L to maintain oxygen saturation greater than 84% with exertion -Now has trilogy vent set up at home on discharge -Oral torsemide as recommended by cardiology -Continue inhalers/steroids per home regimen -Currently on 6 L, will provide O2 on discharge  COPD, stable. No wheezing on exam. -Smoking cessation emphasized during admission as patient remains an active smoker -Continue scheduled inhalers and home dose prednisone -Resume home breztri at discharge  Acute on chronic combined diastolic CHF. Euvolemic currently, net -16 L since admission, current weight 213 pounds (1261 pounds on admission) -torsemide 40 mg twice daily per cardiology -Continue losartan, reduce dose of spironolactone, discontinued amlodipine -Will need close follow-up with heart failure team at discharge -Emphasize importance of low-salt, daily weights, close monitoring of fluids on discharge with patient and his mother  Hyperlipidemia -Continue Zetia given statin intolerance  GERD, stable -continue PPI  Gout, stable -continue allopurinol    Family Communication  : Mother updated at bedside on 1/6  Code Status : Full  Disposition Plan  :  Patient is from home. Anticipated d/c date:  1 to 2 days days. Barriers to d/c or necessity for inpatient status:  Trilogy vent has been arranged today, still needs close monitoring of blood pressure with changes to the blood pressure medications Consults  : Cardiology, PCCM  Procedures  : TTE, 12/18/2020  DVT Prophylaxis  :  Lovenox  MDM: The below labs and imaging reports were  reviewed and summarized above.  Medication management as above.  Lab Results   Component Value Date   PLT 150 12/25/2020    Diet :  Diet Order            Diet heart healthy/carb modified Room service appropriate? Yes; Fluid consistency: Thin; Fluid restriction: 1200 mL Fluid  Diet effective now                  Inpatient Medications Scheduled Meds: . allopurinol  300 mg Oral Daily  . chlorhexidine  15 mL Mouth Rinse BID  . Chlorhexidine Gluconate Cloth  6 each Topical Daily  . enoxaparin (LOVENOX) injection  50 mg Subcutaneous Q24H  . ezetimibe  10 mg Oral Daily  . insulin aspart  0-9 Units Subcutaneous TID WC  . losartan  100 mg Oral Daily  . mouth rinse  15 mL Mouth Rinse q12n4p  . mometasone-formoterol  2 puff Inhalation BID  . montelukast  10 mg Oral QHS  . nicotine  21 mg Transdermal Daily  . pantoprazole  40 mg Oral BID  . predniSONE  10 mg Oral Q breakfast  . roflumilast  500 mcg Oral Daily  . [START ON 12/27/2020] spironolactone  12.5 mg Oral Daily  . torsemide  40 mg Oral BID  . umeclidinium bromide  1 puff Inhalation Daily   Continuous Infusions: PRN Meds:.acetaminophen **OR** acetaminophen, albuterol, alum & mag hydroxide-simeth, LORazepam, nabumetone, ondansetron **OR** ondansetron (ZOFRAN) IV, traMADol  Antibiotics  :   Anti-infectives (From admission, onward)   Start     Dose/Rate Route Frequency Ordered Stop   12/22/20 1230  levofloxacin (LEVAQUIN) tablet 500 mg        500 mg Oral Daily 12/22/20 1127 12/24/20 0845   12/20/20 1500  levofloxacin (LEVAQUIN) IVPB 500 mg  Status:  Discontinued        500 mg 100 mL/hr over 60 Minutes Intravenous Every 24 hours 12/20/20 1340 12/22/20 1127       Objective   Vitals:   12/25/20 2046 12/26/20 0435 12/26/20 0804 12/26/20 1232  BP: 100/66 98/71  (!) 83/68  Pulse: 70 84  77  Resp: 20 20  18   Temp: 98.9 F (37.2 C) 98.5 F (36.9 C)  97.8 F (36.6 C)  TempSrc: Oral   Oral  SpO2: 91% 93% 95% 100%  Weight:  103.1 kg    Height:        SpO2: 100 % O2 Flow Rate (L/min): 6 L/min FiO2  (%): 40 %  Wt Readings from Last 3 Encounters:  12/26/20 103.1 kg  12/12/20 116.1 kg  12/05/20 116.1 kg     Intake/Output Summary (Last 24 hours) at 12/26/2020 1650 Last data filed at 12/26/2020 1400 Gross per 24 hour  Intake 540 ml  Output 1427 ml  Net -887 ml    Physical Exam:   Awake Alert, Oriented X 3, Normal affect No new F.N deficits,  [place].AT, Normal respiratory effort on 6 L, CTAB RRR,No Gallops,Rubs or new Murmurs, no peripheral edema +ve B.Sounds, Abd Soft, No tenderness, No rebound, guarding or rigidity. No Cyanosis, No new Rash or bruise     I have personally reviewed the following:   Data Reviewed:  CBC Recent Labs  Lab 12/20/20 0529 12/22/20 0245 12/23/20 0330 12/25/20 0613  WBC 8.7 7.3 8.2 8.7  HGB 17.1* 17.3* 17.9* 17.7*  HCT 55.5* 56.0* 56.2* 55.6*  PLT 140* 136* 176 150  MCV 105.7* 104.5* 102.7* 103.2*  MCH 32.6 32.3 32.7  32.8  MCHC 30.8 30.9 31.9 31.8  RDW 12.6 12.1 12.3 12.4    Chemistries  Recent Labs  Lab 12/20/20 0529 12/21/20 0557 12/22/20 0245 12/23/20 0330 12/24/20 0411 12/25/20 0613 12/26/20 1008  NA 140 142 143 141  --  137 136  K 4.5 4.6 4.0 3.5  --  3.7 4.0  CL 90* 89* 83* 82*  --  81* 83*  CO2 38* 43* 45* 45*  --  43* 40*  GLUCOSE 131* 122* 117* 132*  --  112* 244*  BUN 36* 38* 38* 38*  --  39* 47*  CREATININE 1.02 0.95 0.94 0.96 1.01 1.14 0.97  CALCIUM 8.7* 8.9 9.2 9.1  --  8.5* 8.7*  MG 1.9 2.1 2.2 2.3  --  2.3  --   AST 28 24  --   --   --  40  --   ALT 24 23  --   --   --  40  --   ALKPHOS 62 53  --   --   --  44  --   BILITOT 0.7 1.0  --   --   --  1.7*  --    ------------------------------------------------------------------------------------------------------------------ No results for input(s): CHOL, HDL, LDLCALC, TRIG, CHOLHDL, LDLDIRECT in the last 72 hours.  Lab Results  Component Value Date   HGBA1C 6.6 (H) 11/28/2020    ------------------------------------------------------------------------------------------------------------------ No results for input(s): TSH, T4TOTAL, T3FREE, THYROIDAB in the last 72 hours.  Invalid input(s): FREET3 ------------------------------------------------------------------------------------------------------------------ No results for input(s): VITAMINB12, FOLATE, FERRITIN, TIBC, IRON, RETICCTPCT in the last 72 hours.  Coagulation profile No results for input(s): INR, PROTIME in the last 168 hours.  No results for input(s): DDIMER in the last 72 hours.  Cardiac Enzymes No results for input(s): CKMB, TROPONINI, MYOGLOBIN in the last 168 hours.  Invalid input(s): CK ------------------------------------------------------------------------------------------------------------------    Component Value Date/Time   BNP 1,374.9 (H) 12/17/2020 1134    Micro Results Recent Results (from the past 240 hour(s))  Resp Panel by RT-PCR (Flu A&B, Covid) Nasopharyngeal Swab     Status: None   Collection Time: 12/17/20 11:34 AM   Specimen: Nasopharyngeal Swab; Nasopharyngeal(NP) swabs in vial transport medium  Result Value Ref Range Status   SARS Coronavirus 2 by RT PCR NEGATIVE NEGATIVE Final    Comment: (NOTE) SARS-CoV-2 target nucleic acids are NOT DETECTED.  The SARS-CoV-2 RNA is generally detectable in upper respiratory specimens during the acute phase of infection. The lowest concentration of SARS-CoV-2 viral copies this assay can detect is 138 copies/mL. A negative result does not preclude SARS-Cov-2 infection and should not be used as the sole basis for treatment or other patient management decisions. A negative result may occur with  improper specimen collection/handling, submission of specimen other than nasopharyngeal swab, presence of viral mutation(s) within the areas targeted by this assay, and inadequate number of viral copies(<138 copies/mL). A negative result  must be combined with clinical observations, patient history, and epidemiological information. The expected result is Negative.  Fact Sheet for Patients:  EntrepreneurPulse.com.au  Fact Sheet for Healthcare Providers:  IncredibleEmployment.be  This test is no t yet approved or cleared by the [place] FDA and  has been authorized for detection and/or diagnosis of SARS-CoV-2 by FDA under an Emergency Use Authorization (EUA). This EUA will remain  in effect (meaning this test can be used) for the duration of the COVID-19 declaration under Section 564(b)(1) of the Act, 21 U.S.C.section 360bbb-3(b)(1), unless the authorization is terminated  or revoked  sooner.       Influenza A by PCR NEGATIVE NEGATIVE Final   Influenza B by PCR NEGATIVE NEGATIVE Final    Comment: (NOTE) The Xpert Xpress SARS-CoV-2/FLU/RSV plus assay is intended as an aid in the diagnosis of influenza from Nasopharyngeal swab specimens and should not be used as a sole basis for treatment. Nasal washings and aspirates are unacceptable for Xpert Xpress SARS-CoV-2/FLU/RSV testing.  Fact Sheet for Patients: EntrepreneurPulse.com.au  Fact Sheet for Healthcare Providers: IncredibleEmployment.be  This test is not yet approved or cleared by the [place] FDA and has been authorized for detection and/or diagnosis of SARS-CoV-2 by FDA under an Emergency Use Authorization (EUA). This EUA will remain in effect (meaning this test can be used) for the duration of the COVID-19 declaration under Section 564(b)(1) of the Act, 21 U.S.C. section 360bbb-3(b)(1), unless the authorization is terminated or revoked.  Performed at [facility], [place] [address] [place], [place] [address]   MRSA PCR Screening     Status: None   Collection Time: 12/20/20  4:01 PM   Specimen: Nasal Mucosa; Nasopharyngeal  Result Value Ref Range Status    MRSA by PCR NEGATIVE NEGATIVE Final    Comment:        The GeneXpert MRSA Assay (FDA approved for NASAL specimens only), is one component of a comprehensive MRSA colonization surveillance program. It is not intended to diagnose MRSA infection nor to guide or monitor treatment for MRSA infections. Performed at [facility], [place] [address], [place], [place] [address]     Radiology Reports DG Chest 2 View  Result Date: 12/17/2020 CLINICAL DATA:  Shortness of breath EXAM: CHEST - 2 VIEW COMPARISON:  June 27, 2018 FINDINGS: There is mild left midlung atelectasis. No edema or airspace opacity. There is mild cardiomegaly with a degree of pulmonary venous hypertension. No evident adenopathy. No bone lesions. IMPRESSION: Mild cardiomegaly with pulmonary vascular congestion. No edema or consolidation. Mild left midlung atelectatic change. Electronically Signed   By: Lowella Grip III M.D.   On: 12/17/2020 11:22   ECHOCARDIOGRAM COMPLETE  Result Date: 12/18/2020    ECHOCARDIOGRAM REPORT   Patient Name:   LANEY TUFO Date of Exam: 12/18/2020 Medical Rec #:  [id number]           Height:       70.0 in Accession #:    [id number]          Weight:       255.5 lb Date of Birth:  06-08-1956            BSA:          2.316 m Patient Age:    55 years            BP:           137/94 mmHg Patient Gender: M                   HR:           111 bpm. Exam Location:  Inpatient Procedure: 2D Echo, Color Doppler and Cardiac Doppler Indications:    [id number] Acute systolic (congestive) heart failure  History:        Patient has prior history of Echocardiogram examinations, most                 recent 11/30/2019. CHF, COPD; Risk Factors:Hypertension,  Dyslipidemia and Sleep Apnea.  Sonographer:    Raquel Sarna Senior RDCS Referring Phys: [id number] Jonnie Finner  Sonographer Comments: Scanned upright due to dyspnea. IMPRESSIONS  1. Left ventricular ejection fraction, by estimation, is 40 to 45%.  The left ventricle has mildly decreased function. The left ventricle demonstrates global hypokinesis. The left ventricular internal cavity size was moderately dilated. There is mild left ventricular hypertrophy. Left ventricular diastolic parameters are consistent with Grade II diastolic dysfunction (pseudonormalization). Elevated left atrial pressure.  2. Right ventricular systolic function is mildly reduced. The right ventricular size is severely enlarged. There is moderately elevated pulmonary artery systolic pressure. The estimated right ventricular systolic pressure is 123456 mmHg.  3. Left atrial size was mildly dilated.  4. Right atrial size was mildly dilated.  5. The mitral valve is normal in structure. No evidence of mitral valve regurgitation.  6. The aortic valve was not well visualized. Aortic valve regurgitation is not visualized. No aortic stenosis is present.  7. There is mild dilatation of the ascending aorta, measuring 37 mm.  8. The inferior vena cava is dilated in size with <50% respiratory variability, suggesting right atrial pressure of 15 mmHg. FINDINGS  Left Ventricle: Left ventricular ejection fraction, by estimation, is 40 to 45%. The left ventricle has mildly decreased function. The left ventricle demonstrates global hypokinesis. The left ventricular internal cavity size was moderately dilated. There is mild left ventricular hypertrophy. Left ventricular diastolic parameters are consistent with Grade II diastolic dysfunction (pseudonormalization). Elevated left atrial pressure. Right Ventricle: The right ventricular size is severely enlarged. Right vetricular wall thickness was not assessed. Right ventricular systolic function is mildly reduced. There is moderately elevated pulmonary artery systolic pressure. The tricuspid regurgitant velocity is 2.92 m/s, and with an assumed right atrial pressure of 15 mmHg, the estimated right ventricular systolic pressure is 123456 mmHg. Left Atrium: Left  atrial size was mildly dilated. Right Atrium: Right atrial size was mildly dilated. Pericardium: Trivial pericardial effusion is present. Mitral Valve: The mitral valve is normal in structure. No evidence of mitral valve regurgitation. Tricuspid Valve: The tricuspid valve is normal in structure. Tricuspid valve regurgitation is trivial. Aortic Valve: The aortic valve was not well visualized. Aortic valve regurgitation is not visualized. No aortic stenosis is present. Pulmonic Valve: The pulmonic valve was not well visualized. Pulmonic valve regurgitation is not visualized. Aorta: The aortic root is normal in size and structure. There is mild dilatation of the ascending aorta, measuring 37 mm. Venous: The inferior vena cava is dilated in size with less than 50% respiratory variability, suggesting right atrial pressure of 15 mmHg. IAS/Shunts: The interatrial septum was not well visualized.  LEFT VENTRICLE PLAX 2D LVIDd:         6.00 cm      Diastology LVIDs:         4.40 cm      LV e' medial:  3.48 cm/s LV PW:         1.20 cm      LV e' lateral: 4.13 cm/s LV IVS:        1.20 cm LVOT diam:     2.20 cm LV SV:         63 LV SV Index:   27 LVOT Area:     3.80 cm  LV Volumes (MOD) LV vol d, MOD A2C: 149.0 ml LV vol d, MOD A4C: 140.0 ml LV vol s, MOD A2C: 90.6 ml LV vol s, MOD A4C: 82.9 ml LV SV MOD  A2C:     58.4 ml LV SV MOD A4C:     140.0 ml LV SV MOD BP:      59.3 ml RIGHT VENTRICLE RV S prime:     13.90 cm/s TAPSE (M-mode): 2.4 cm LEFT ATRIUM           Index       RIGHT ATRIUM           Index LA diam:      4.20 cm 1.81 cm/m  RA Area:     22.70 cm LA Vol (A2C): 78.6 ml 33.93 ml/m RA Volume:   73.20 ml  31.60 ml/m LA Vol (A4C): 68.3 ml 29.49 ml/m  AORTIC VALVE LVOT Vmax:   112.03 cm/s LVOT Vmean:  84.833 cm/s LVOT VTI:    0.165 m  AORTA Ao Root diam: 3.60 cm Ao Asc diam:  3.70 cm TRICUSPID VALVE TR Peak grad:   34.1 mmHg TR Vmax:        292.00 cm/s  SHUNTS Systemic VTI:  0.16 m Systemic Diam: 2.20 cm Oswaldo Milian MD Electronically signed by Oswaldo Milian MD Signature Date/Time: 12/18/2020/12:52:30 PM    Final    VAS [place] LOWER EXTREMITY VENOUS REFLUX  Result Date: 12/12/2020  Lower Venous Reflux Study Indications: Pain, Swelling, Edema, and Gout.  Performing Technologist: Delorise Shiner RVT  Examination Guidelines: A complete evaluation includes B-mode imaging, spectral Doppler, color Doppler, and power Doppler as needed of all accessible portions of each vessel. Bilateral testing is considered an integral part of a complete examination. Limited examinations for reoccurring indications may be performed as noted. The reflux portion of the exam is performed with the patient in reverse Trendelenburg. Significant venous reflux is defined as >500 ms in the superficial venous system, and >1 second in the deep venous system.  Venous Reflux Times +--------------+---------+------+-----------+------------+--------+ RIGHT         Reflux NoRefluxReflux TimeDiameter cmsComments                         Yes                                  +--------------+---------+------+-----------+------------+--------+ CFV           no                                             +--------------+---------+------+-----------+------------+--------+ FV prox       no                                             +--------------+---------+------+-----------+------------+--------+ FV mid        no                                             +--------------+---------+------+-----------+------------+--------+ FV dist       no                                             +--------------+---------+------+-----------+------------+--------+  Popliteal     no                                             +--------------+---------+------+-----------+------------+--------+ GSV at [facility]    no                           0.667             +--------------+---------+------+-----------+------------+--------+  GSV prox thighno                           0.637             +--------------+---------+------+-----------+------------+--------+ GSV mid thigh no                           0.584             +--------------+---------+------+-----------+------------+--------+ GSV dist thighno                           0.524             +--------------+---------+------+-----------+------------+--------+ GSV at knee   no                           0.553             +--------------+---------+------+-----------+------------+--------+ GSV prox calf                              0.467             +--------------+---------+------+-----------+------------+--------+ GSV mid calf                               0.445             +--------------+---------+------+-----------+------------+--------+ SSV Pop Fossa no                           0.577             +--------------+---------+------+-----------+------------+--------+ SSV prox calf no                           0.326             +--------------+---------+------+-----------+------------+--------+ SSV mid calf                               0.425             +--------------+---------+------+-----------+------------+--------+  +--------------+---------+------+-----------+------------+--------+ LEFT          Reflux NoRefluxReflux TimeDiameter cmsComments                         Yes                                  +--------------+---------+------+-----------+------------+--------+ CFV           no                                             +--------------+---------+------+-----------+------------+--------+  FV prox       no                                             +--------------+---------+------+-----------+------------+--------+ FV mid        no                                             +--------------+---------+------+-----------+------------+--------+ FV dist       no                                              +--------------+---------+------+-----------+------------+--------+ Popliteal     no                                             +--------------+---------+------+-----------+------------+--------+ GSV at SFJ    no                           0.653             +--------------+---------+------+-----------+------------+--------+ GSV prox thighno                           0.424             +--------------+---------+------+-----------+------------+--------+ GSV mid thigh no                           0.467             +--------------+---------+------+-----------+------------+--------+ GSV dist thighno                           0.504             +--------------+---------+------+-----------+------------+--------+ GSV at knee   no                           0.467             +--------------+---------+------+-----------+------------+--------+ GSV prox calf                              0.361             +--------------+---------+------+-----------+------------+--------+ GSV mid calf                               0.392             +--------------+---------+------+-----------+------------+--------+ SSV Pop Fossa no                           0.744             +--------------+---------+------+-----------+------------+--------+ SSV prox calf no  0.468             +--------------+---------+------+-----------+------------+--------+ SSV mid calf                               0.394             +--------------+---------+------+-----------+------------+--------+   Summary: Bilateral: - No evidence of deep vein thrombosis seen in the lower extremities, bilaterally, from the common femoral through the popliteal veins. - No evidence of superficial venous thrombosis in the lower extremities, bilaterally. - No evidence of deep venous insufficiency seen bilaterally in the lower extremity. - No evidence of superficial  venous reflux seen in the greater saphenous veins bilaterally. - No evidence of superficial venous reflux seen in the short saphenous veins bilaterally.  *See table(s) above for measurements and observations. Electronically signed by Deitra Mayo MD on 12/12/2020 at 3:40:43 PM.    Final      Time Spent in minutes  30     Desiree Hane M.D on 12/26/2020 at 4:50 PM  To page go to [email or web address] - password [facility]

## 2020-12-26 NOTE — Progress Notes (Addendum)
Progress Note  Patient Name: Robert Church Date of Encounter: 12/26/2020  [facility] Cardiologist: Quay Burow, MD   Subjective   Breathing improved. Feeling much better. No chest pain or palpitations.   Inpatient Medications    Scheduled Meds: . allopurinol  300 mg Oral Daily  . amLODipine  2.5 mg Oral Daily  . chlorhexidine  15 mL Mouth Rinse BID  . Chlorhexidine Gluconate Cloth  6 each Topical Daily  . enoxaparin (LOVENOX) injection  50 mg Subcutaneous Q24H  . ezetimibe  10 mg Oral Daily  . insulin aspart  0-9 Units Subcutaneous TID WC  . losartan  100 mg Oral Daily  . mouth rinse  15 mL Mouth Rinse q12n4p  . mometasone-formoterol  2 puff Inhalation BID  . montelukast  10 mg Oral QHS  . nicotine  21 mg Transdermal Daily  . pantoprazole  40 mg Oral BID  . predniSONE  10 mg Oral Q breakfast  . roflumilast  500 mcg Oral Daily  . spironolactone  25 mg Oral Daily  . torsemide  40 mg Oral BID  . umeclidinium bromide  1 puff Inhalation Daily   Continuous Infusions:  PRN Meds: acetaminophen **OR** acetaminophen, albuterol, alum & mag hydroxide-simeth, LORazepam, nabumetone, ondansetron **OR** ondansetron (ZOFRAN) IV, traMADol   Vital Signs    Vitals:   12/25/20 2045 12/25/20 2046 12/26/20 0435 12/26/20 0804  BP:  100/66 98/71   Pulse:  70 84   Resp:  20 20   Temp:  98.9 F (37.2 C) 98.5 F (36.9 C)   TempSrc:  Oral    SpO2: 94% 91% 93% 95%  Weight:   103.1 kg   Height:        Intake/Output Summary (Last 24 hours) at 12/26/2020 0920 Last data filed at 12/26/2020 0435 Gross per 24 hour  Intake 780 ml  Output 1275 ml  Net -495 ml   Last 3 Weights 12/26/2020 12/24/2020 12/24/2020  Weight (lbs) 227 lb 4.7 oz 213 lb 13.5 oz 230 lb 9.6 oz  Weight (kg) 103.1 kg 97 kg 104.6 kg      Telemetry    SR - Personally Reviewed  ECG  N/A  Physical Exam   GEN: Obese male in no acute distress, sitting in chair Neck: No JVD Cardiac: RRR, no murmurs, rubs, or  gallops.  Respiratory:Diminished breath sound  GI: Soft, nontender, non-distended  MS: No edema; No deformity. Neuro:  Nonfocal  Psych: Normal affect   Labs    High Sensitivity Troponin:   Recent Labs  Lab 12/17/20 1134 12/17/20 1305  TROPONINIHS 26* 27*      Chemistry Recent Labs  Lab 12/20/20 0529 12/21/20 0557 12/22/20 0245 12/23/20 0330 12/24/20 0411 12/25/20 0613  NA 140 142 143 141  --  137  K 4.5 4.6 4.0 3.5  --  3.7  CL 90* 89* 83* 82*  --  81*  CO2 38* 43* 45* 45*  --  43*  GLUCOSE 131* 122* 117* 132*  --  112*  BUN 36* 38* 38* 38*  --  39*  CREATININE 1.02 0.95 0.94 0.96 1.01 1.14  CALCIUM 8.7* 8.9 9.2 9.1  --  8.5*  PROT 6.9 6.3*  --   --   --  6.0*  ALBUMIN 3.6 3.3*  --   --   --  3.5  AST 28 24  --   --   --  40  ALT 24 23  --   --   --  40  ALKPHOS 62 53  --   --   --  44  BILITOT 0.7 1.0  --   --   --  1.7*  GFRNONAA >60 >60 >60 >60 >60 >60  ANIONGAP 12 10 15 14   --  13     Hematology Recent Labs  Lab 12/22/20 0245 12/23/20 0330 12/25/20 0613  WBC 7.3 8.2 8.7  RBC 5.36 5.47 5.39  HGB 17.3* 17.9* 17.7*  HCT 56.0* 56.2* 55.6*  MCV 104.5* 102.7* 103.2*  MCH 32.3 32.7 32.8  MCHC 30.9 31.9 31.8  RDW 12.1 12.3 12.4  PLT 136* 176 150    BNPNo results for input(s): BNP, PROBNP in the last 168 hours.   DDimer  Recent Labs  Lab 12/19/20 0954  DDIMER 1.98*     Radiology    No results found.  Cardiac Studies   Echo 12/18/20 1. Left ventricular ejection fraction, by estimation, is 40 to 45%. The  left ventricle has mildly decreased function. The left ventricle  demonstrates global hypokinesis. The left ventricular internal cavity size  was moderately dilated. There is mild  left ventricular hypertrophy. Left ventricular diastolic parameters are  consistent with Grade II diastolic dysfunction (pseudonormalization).  Elevated left atrial pressure.  2. Right ventricular systolic function is mildly reduced. The right  ventricular  size is severely enlarged. There is moderately elevated  pulmonary artery systolic pressure. The estimated right ventricular  systolic pressure is 123456 mmHg.  3. Left atrial size was mildly dilated.  4. Right atrial size was mildly dilated.  5. The mitral valve is normal in structure. No evidence of mitral valve  regurgitation.  6. The aortic valve was not well visualized. Aortic valve regurgitation  is not visualized. No aortic stenosis is present.  7. There is mild dilatation of the ascending aorta, measuring 37 mm.  8. The inferior vena cava is dilated in size with <50% respiratory  variability, suggesting right atrial pressure of 15 mmHg.    Patient Profile     65 y.o. male with PMH ofchronic systolic and diastolic heart failure, nonischemic cardiomyopathy, COPD, tobacco abuse, alcohol abuse, OSA noncompliant with CPAP, chronic venous insufficiency seen for CHF.   Patient presented with significant LE edema and SOB. Followed by Dr. Haroldine Laws outpatient for NICM. BNP 1379 and CXR with pulmonary vascular congestion without edema or consolidation.  Assessment & Plan    1. Acute on chronic combined CHF - Echo this admission with EF 40-45%, global hypokinesis, and G2DD. - Treated with IV lasix 80mg   Which transitioned to Torsemide - Net I & O -19.3L. weight down 262>>227lb.  - Renal function normal - Continue Torsemide 40mg  BID, Arlyce Harman 25mg  qd, Losartan 100mg  qd  - No BB given COPD and has not tolerated Bisoprolol in past -Entresto not covered by insurance. Review assistance as outpatient. - CM to check SGLTs inhibitor beneftis  2. OSA  3. 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 HTN - BP soft today. Will review meds with MD. ? Reduce Norvasc.   4. Tobacco abuse - Recommended cessation   Has follow up with Dr. Gwenlyn Found 01/03/2021.   For questions or updates, please contact [place] Please  consult [email or web address] for contact info under        Signed, Leanor Kail, PA  12/26/2020, 9:20 AM    Patient seen and examined and agree with Leanor Kail, PA as detailed above.  In brief, the patient  is a64 y.o.malewith PMH ofchronic systolic and diastolic heart failure, nonischemic cardiomyopathy, COPD, tobacco abuse, alcohol abuse, OSA noncompliant with CPAP, chronic venous insufficiencywho presented with acute on chronic heart failure for which Cardiology has been consulted.  Volume status improved and transitioned to PO lasix. Blood pressures low this morning in 80-90s. He is anxious to go home.  Exam: GEN:No acute distress. Sitting up in the chair, alert   Neck:No JVD Cardiac:RRR, no murmurs, rubs, or gallops.  Respiratory:Clear bilaterally KZ:SWFU, nontender, non-distended  MS:No significant edema in LE. Dependent rubor and chronic venous insufficiency Neuro:Nonfocal  Psych: Normal affect   Plan: -Continue torsemide 40mg  BID -Will stop amlodipine and decrease spironolactone to 12.5mg  daily given hypotension this AM -Continue losartan 100mg  daily (entresto not covered by insurance; will continue to work-on as out-patient -Will work on coverage for and SGLT-2 inhibitor as out-patient -Continue zetia as intolerant to statins -Set-up for home oxygen and trelogy vent at home -Will need close follow-up with HF team on discharge; discussed the importance of low Na diet, daily weights, avoiding alcohol, and compliance with medications in order to help prevent a recurrent HFrEF exacerbation  Cardiology will sign-off at this time. Please feel free to call with questions and concerns. We will arrange for out-patient follow-up with Dr. and HF team after discharge.  [employer], MD

## 2020-12-27 LAB — GLUCOSE, CAPILLARY
Glucose-Capillary: 126 mg/dL — ABNORMAL HIGH (ref 70–99)
Glucose-Capillary: 141 mg/dL — ABNORMAL HIGH (ref 70–99)
Glucose-Capillary: 117 mg/dL — ABNORMAL HIGH (ref 70–99)
Glucose-Capillary: 190 mg/dL — ABNORMAL HIGH (ref 70–99)

## 2020-12-27 MED ORDER — TORSEMIDE 20 MG PO TABS: 40.0000 mg | ORAL_TABLET | Freq: Two times a day (BID) | ORAL | 2 refills | Status: DC

## 2020-12-27 MED ORDER — SPIRONOLACTONE 25 MG PO TABS: 12.5000 mg | ORAL_TABLET | Freq: Every day | ORAL | 2 refills | Status: DC

## 2020-12-27 MED ORDER — NICOTINE 21 MG/24HR TD PT24: 21.0000 mg | MEDICATED_PATCH | Freq: Every day | TRANSDERMAL | 0 refills | Status: DC

## 2020-12-27 MED ORDER — BREZTRI AEROSPHERE 160-9-4.8 MCG/ACT IN AERO: 2.0000 | INHALATION_SPRAY | Freq: Two times a day (BID) | RESPIRATORY_TRACT | 2 refills | Status: DC

## 2020-12-27 NOTE — Discharge Instructions (Addendum)
Low salt diet Weigh daily-- if weight climbs more that 3 pounds in a day or 5 pounds in a week please call Dr. Kennon Holter office.  Please be sure to follow-up with your doctors after discharge.  You should see your regular primary care doctor 1 week after discharge.  You should follow-up with your cardiologist per appointment.  You should also follow-up with your lung doctor.  New medication changes: Your amlodipine was discontinued due to your low blood pressure.  You should continue to take torsemide 40 mg twice a day to ensure no buildup of fluid for your heart.  Your spironolactone was reduced to 12.5 mg daily.  You should use your trilogy vent every day as well. Continue your daily prednisone for your lung disease/COPD.

## 2020-12-27 NOTE — TOC Transition Note (Addendum)
Transition of Care [facility]) - CM/SW Discharge Note   Patient Details  Name: Robert Church MRN: [id number] Date of Birth: 17-Jun-1956  Transition of Care [facility] CM/SW Contact:  Ross Ludwig, LCSW Phone Number: 12/27/2020, 2:42 PM   Clinical Narrative:     Patient will be going home with Trilogy NIV and oxygen.  CSW signing off please reconsult with any other social work needs, DME agency [employer] has been notified of planned discharge.    Final next level of care: Home/Self Care Barriers to Discharge: Barriers Resolved   Patient Goals and CMS Choice Patient states their goals for this hospitalization and ongoing recovery are:: To return back home with Trilogy and oxygen. CMS Medicare.gov Compare Post Acute Care list provided to:: Patient Choice offered to / list presented to : Patient  Discharge Placement                       Discharge Plan and Services   Discharge Planning Services: CM Consult            DME Arranged: Oxygen,NIV DME Agency: [employer] Date DME Agency Contacted: 12/27/20 Time DME Agency Contacted: [phone] Representative spoke with at DME Agency: [place]: NIV          Social Determinants of Health (SDOH) Interventions     Readmission Risk Interventions No flowsheet data found.

## 2020-12-27 NOTE — Discharge Summary (Addendum)
Robert Church YSA:[id number] DOB: [birth year] DOA: 12/17/2020  PCP: Janith Lima, MD  Admit date: 12/17/2020 Discharge date: 12/30/2020  Admitted From: home Disposition:  home  Recommendations for Outpatient Follow-up:  1. Follow up with PCP in 1-2 weeks 2. Patient COPD/OSA: Continue taking Breztri inhaler, Trilegy setup as outpatient 3. Currently on 6 L, will provide O2 on discharge, can continue to wean as outpatient 4. Follow-up arranged with cardiology/advanced heart failure.   Home Health:Otpatient PT Equipment/Devices:6 L Nasal Canula Oxygen, Trilogy device  Discharge Condition:Stable   CODE STATUS:FULL    Brief/Interim Summary: History of present illness:   Robert Church is a 65 y.o. year old male with medical history significant for Chronic systolic and diastolic CHF, nonischemic cardiomyopathy, COPD, OSA with nonadherence to CPAP, chronic venous insufficiency, alcohol/tobacco abuse who presented with cough, congestion and difficulty breathing was found to have acute hypoxic respiratory failure secondary to CHF exacerbation evident with elevated BNP and signs of pulmonary vascular congestion on chest x-ray as well as acute hypercarbic respiratory failure secondary to duration of COPD/OSA (PCO2 of 101 on ABG on admission). He required transfer to the ICU during hospital course due to need for persistent BiPAP use for acute hypercapnic respiratory failure while also being diuresed for his CHF with IV Lasix.  He was able to transition to oral torsemide and remained stable on 6 L.    Remaining hospital course addressed in problem based format below:   Hospital Course:   Hypotension secondary to intolerance to BP medications in setting of diuresis, improved.  Started after transitioning from IV lasix to oral torsemide with SBP nadir of 80s .  It delayed his initial discharge planned date of 1/7 and required 3 days of further titration of his BP medications and oral  torsemide with the help of cardiology. He maintained adequate perfusion with normal lactic acid. in the setting of being diuresed 25 L during hospital stay his torsemide dose was reduced to minimal dose, amlodiine discontinued and losartan and spironolactone reduced as well. He tolerated that regimen on discharge day with SBP remaining stable at 101/85 - Cardiology recommends continuing torsemide at reduced dose of 10 mg  daily - Discontinued amlodipine -Reduced spironolactone to 12.5 mg daily and losartan 12.5 mg daily --He has very close follow up with his cardiologist this week and instructed to closely monitor his weights, fluid intake and diet  Acute on chronic hypercapnic respiratory failure secondary to exacerbation of COPD and complicated by underlying OSA with poor adherence to CPAP, stable on 6 L. -Tolerating nightly BiPAP -Respiratory status stable on 6 L -At extremely high risk of repeat decompensation given multiple risk factors including chronic hypoxia, chronic lung disease, chronic heart disease with known pulmonary hypertension for which pulmonology is recommending patient have trelogy vent -Trelegy vent now set up for home care  Acute hypoxic respiratory failure, secondary to COPD/CHF/OSA exacerbation. Stable.  Euvolemic now, with no wheezing and tolerating BiPAP nightly. With ambulation required 6 L to maintain oxygen saturation greater than 88% with exertion on 1/6. On day of discharge maintained SpO2 of 97% with 6 L ( 92% on room air while ambulating). Despite sats patient will still need oxygen with close weaning as outpatient -Now has trilogy vent set up at home on discharge -Oral torsemide as recommended by cardiology -Continue inhalers/steroids per home regimen -Currently on 6 L, will provide O2 on discharge, can continue to wean as outpatient  COPD, stable. No wheezing on exam. -Smoking cessation emphasized during  admission as patient remains an active  smoker -Continue scheduled inhalers and home dose prednisone -Resume home breztri at discharge  Acute on chronic combined diastolic CHF, stable.  Euvolemic currently, net -35 L this admission, current weight 210 pounds (261 pounds on admission) -torsemide 10 mg  daily per cardiology -Continue losartan12.5 mg, reduce dose of spironolactone 12.5 mg, discontinued amlodipine - Beta-blocker contraindicated given COPD - Entresto not covered by insurance.  Review assistance of Entresto and SGLT2 inhibitor as outpatient -Follow-up with cardiology (Dr. Gwenlyn Found) at discharge on 1/14 and Advanced heart failure team ( Dr. Haroldine Laws) 1/18 -Emphasized importance of low-salt, daily weights, close monitoring of fluids on discharge with patient and his mother  Hyperlipidemia -Continue Zetia given statin intolerance  GERD, stable -continue PPI  Gout, stable -continue allopurinol  Type II diabetes, controlled.  A1c 6.6 - Can resume home metformin  Obesity, class II. - Long discussion on d importance of following a diet high in [employer], low in processed foods to aid in weight loss and to be sure to avoid high salt foods that will worsen his heart failure  Consultations: Cardiology, PCCM  Procedures/Studies: TTE, 12/18/2020  1. Left ventricular ejection fraction, by estimation, is 40 to 45%. The  left ventricle has mildly decreased function. The left ventricle  demonstrates global hypokinesis. The left ventricular internal cavity size  was moderately dilated. There is mild  left ventricular hypertrophy. Left ventricular diastolic parameters are  consistent with Grade II diastolic dysfunction (pseudonormalization).  Elevated left atrial pressure.  2. Right ventricular systolic function is mildly reduced. The right  ventricular size is severely enlarged. There is moderately elevated  pulmonary artery systolic pressure. The estimated right ventricular  systolic pressure is 123456 mmHg.  3.  Left atrial size was mildly dilated.  4. Right atrial size was mildly dilated.  5. The mitral valve is normal in structure. No evidence of mitral valve  regurgitation.  6. The aortic valve was not well visualized. Aortic valve regurgitation  is not visualized. No aortic stenosis is present.  7. There is mild dilatation of the ascending aorta, measuring 37 mm.  8. The inferior vena cava is dilated in size with <50% respiratory  variability, suggesting right atrial pressure of 15 mmHg. Subjective:  Discharge Exam: Vitals:   12/30/20 0958 12/30/20 1303  BP: 91/63 101/85  Pulse:  85  Resp:  18  Temp:  98.4 F (36.9 C)  SpO2: 97% 96%   Vitals:   12/29/20 2146 12/30/20 0535 12/30/20 0958 12/30/20 1303  BP: 123/74 106/86 91/63 101/85  Pulse: 89 85  85  Resp: 18 18  18   Temp: 98.4 F (36.9 C) 98.1 F (36.7 C)  98.4 F (36.9 C)  TempSrc: Oral Oral  Oral  SpO2: 97% 98% 97% 96%  Weight:  95.6 kg    Height:        General: Lying in bed, no apparent distress Eyes: EOMI, anicteric ENT: Oral Mucosa clear and moist Cardiovascular: regular rate and rhythm, no murmurs, rubs or gallops, no edema, Respiratory: Normal respiratory effort on 6 L, lungs clear to auscultation bilaterally Abdomen: soft, non-distended, non-tender, normal bowel sounds Skin: No Rash Neurologic: Grossly no focal neuro deficit.Mental status AAOx3, speech normal, Psychiatric:Appropriate affect, and mood  Discharge Diagnoses:  Principal Problem:   Acute CHF (congestive heart failure) (HCC) Active Problems:   HTN (hypertension)   GERD (gastroesophageal reflux disease)   Obstructive sleep apnea syndrome   COPD, group D, by GOLD 2017 classification ([place])  Morbid obesity (HCC)   Chronic combined systolic and diastolic CHF (congestive heart failure) (HCC)   Hyperlipidemia LDL goal <70   Dyslipidemia   Type II diabetes mellitus with manifestations (HCC)   Thrombocytopenia (HCC)    Hypotension    Discharge Instructions  Discharge Instructions    Ambulatory referral to Physical Therapy   Complete by: As directed    COPD, CHF, debility, assess and treat   Diet - low sodium heart healthy   Complete by: As directed    Diet - low sodium heart healthy   Complete by: As directed    Increase activity slowly   Complete by: As directed    Increase activity slowly   Complete by: As directed    No wound care   Complete by: As directed    No wound care   Complete by: As directed      Allergies as of 12/30/2020      Reactions   Clindamycin Other (See Comments)   Tongue swelling   Doxycycline    Severe stomach upset per patient   E-mycin [erythromycin Base] Swelling   Penicillins Swelling, Other (See Comments)   Childhood rxn--MD stated he "almost died" Has patient had a PCN reaction causing immediate rash, facial/tongue/throat swelling, SOB or lightheadedness with hypotension:Yes Has patient had a PCN reaction causing severe rash involving mucus membranes or skin necrosis:unsure Has patient had a PCN reaction that required hospitalization:unsure Has patient had a PCN reaction occurring within the last 10 years:No If all of the above answers are "NO", then may proceed with Cephalosporin use.   Rosuvastatin    Other reaction(s): Myalgias (intolerance)      Medication List    STOP taking these medications   ALPRAZolam 1 MG tablet Commonly known as: XANAX   BC HEADACHE POWDER PO   ciprofloxacin 500 MG tablet Commonly known as: Cipro   Colchicine 0.6 MG Caps Commonly known as: Mitigare   Dulera 200-5 MCG/ACT Aero Generic drug: mometasone-formoterol   hydrALAZINE 10 MG tablet Commonly known as: APRESOLINE   levofloxacin 500 MG tablet Commonly known as: LEVAQUIN     TAKE these medications   albuterol (2.5 MG/3ML) 0.083% nebulizer solution Commonly known as: PROVENTIL Take 3 mLs (2.5 mg total) by nebulization every 6 (six) hours as needed for  wheezing or shortness of breath.   albuterol 108 (90 Base) MCG/ACT inhaler Commonly known as: VENTOLIN HFA Inhale 2 puffs into the lungs every 6 (six) hours as needed for wheezing.   allopurinol 300 MG tablet Commonly known as: ZYLOPRIM Take 1 tablet (300 mg total) by mouth daily.   Azelastine-Fluticasone 137-50 MCG/ACT Susp 1 spray each nostril twice daily What changed:   how much to take  how to take this  when to take this  reasons to take this  additional instructions   Breztri Aerosphere 160-9-4.8 MCG/ACT Aero Generic drug: Budeson-Glycopyrrol-Formoterol Inhale 2 puffs into the lungs in the morning and at bedtime.   cholecalciferol 1000 units tablet Commonly known as: VITAMIN D Take 1,000 Units by mouth daily.   EPINEPHrine 0.3 mg/0.3 mL Soaj injection Commonly known as: EPI-PEN Inject 0.3 mLs (0.3 mg total) into the muscle as needed for anaphylaxis.   ezetimibe 10 MG tablet Commonly known as: Zetia Take 1 tablet (10 mg total) by mouth daily.   losartan 25 MG tablet Commonly known as: COZAAR Take 0.5 tablets (12.5 mg total) by mouth daily. What changed:   medication strength  how much to take  metFORMIN 750 MG 24 hr tablet Commonly known as: Glucophage XR Take 1 tablet (750 mg total) by mouth daily with breakfast.   montelukast 10 MG tablet Commonly known as: Singulair Take 1 tablet (10 mg total) by mouth at bedtime.   nabumetone 500 MG tablet Commonly known as: Relafen Take 1 tablet (500 mg total) by mouth 2 (two) times daily as needed. What changed: reasons to take this   nicotine 21 mg/24hr patch Commonly known as: NICODERM CQ - dosed in mg/24 hours Place 1 patch (21 mg total) onto the skin daily.   NovoFine 32G X 6 MM Misc Generic drug: Insulin Pen Needle 1 Act by Does not apply route once a week.   omeprazole 20 MG capsule Commonly known as: PRILOSEC Take 1 capsule (20 mg total) by mouth 2 (two) times daily before a meal. What  changed: when to take this   polyethylene glycol 17 g packet Commonly known as: MIRALAX / GLYCOLAX Take 17 g by mouth daily as needed.   predniSONE 10 MG tablet Commonly known as: DELTASONE Take 1 tablet (10 mg total) by mouth daily with breakfast.   roflumilast 500 MCG Tabs tablet Commonly known as: DALIRESP Take 1 tablet (500 mcg total) by mouth daily.   Spiriva Respimat 2.5 MCG/ACT Aers Generic drug: Tiotropium Bromide Monohydrate Inhale 2.5 mcg into the lungs 2 (two) times daily.   spironolactone 25 MG tablet Commonly known as: ALDACTONE Take 0.5 tablets (12.5 mg total) by mouth daily.   torsemide 10 MG tablet Commonly known as: DEMADEX Take 1 tablet (10 mg total) by mouth daily. Start taking on: December 31, 2020 What changed:   medication strength  how much to take  Another medication with the same name was removed. Continue taking this medication, and follow the directions you see here.   traMADol 50 MG tablet Commonly known as: ULTRAM Take 50 mg by mouth every 6 (six) hours as needed for severe pain.            Durable Medical Equipment  (From admission, onward)         Start     Ordered   12/27/20 1335  For home use only DME oxygen  Once       Question Answer Comment  Length of Need 12 Months   Mode or (Route) Nasal cannula   Liters per Minute 6   Frequency Continuous (stationary and portable oxygen unit needed)   Oxygen delivery system Gas      12/27/20 1334          Follow-up Information    [name], MD. Go on 01/03/2021.   Specialties: Cardiology, Radiology Why: @3 :30pm for hospital follow up Contact information: [address] [place] [address] [phone]        Bensimhon, Shaune Pascal, MD. Go on 01/07/2021.   Specialty: Cardiology Why: @11 :30am for hospital follow up  Contact information: [place] [address] [phone]              Allergies  Allergen  Reactions  . Clindamycin Other (See Comments)    Tongue swelling  . Doxycycline     Severe stomach upset per patient  . E-Mycin [Erythromycin Base] Swelling  . Penicillins Swelling and Other (See Comments)    Childhood rxn--MD stated he "almost died" Has patient had a PCN reaction causing immediate rash, facial/tongue/throat swelling, SOB or lightheadedness with hypotension:Yes Has patient had a PCN reaction causing severe  rash involving mucus membranes or skin necrosis:unsure Has patient had a PCN reaction that required hospitalization:unsure Has patient had a PCN reaction occurring within the last 10 years:No If all of the above answers are "NO", then may proceed with Cephalosporin use.    . Rosuvastatin     Other reaction(s): Myalgias (intolerance)        The results of significant diagnostics from this hospitalization (including imaging, microbiology, ancillary and laboratory) are listed below for reference.     Microbiology: Recent Results (from the past 240 hour(s))  MRSA PCR Screening     Status: None   Collection Time: 12/20/20  4:01 PM   Specimen: Nasal Mucosa; Nasopharyngeal  Result Value Ref Range Status   MRSA by PCR NEGATIVE NEGATIVE Final    Comment:        The GeneXpert MRSA Assay (FDA approved for NASAL specimens only), is one component of a comprehensive MRSA colonization surveillance program. It is not intended to diagnose MRSA infection nor to guide or monitor treatment for MRSA infections. Performed at [facility], [place] [address] [place], [place] [address]      Labs: BNP (last 3 results) Recent Labs    01/11/20 1444 12/17/20 1134  BNP 267.3* [id number]*   Basic Metabolic Panel: Recent Labs  Lab 12/24/20 0411 12/25/20 0613 12/26/20 1008 12/28/20 1417 12/30/20 0454  NA  --  137 136 136 136  K  --  3.7 4.0 4.4 4.5  CL  --  81* 83* 88* 96*  CO2  --  43* 40* 40* 32  GLUCOSE  --  112* 244* 124* 147*  BUN  --  39* 47*  37* 27*  CREATININE 1.01 1.14 0.97 0.97 0.87  CALCIUM  --  8.5* 8.7* 9.4 8.8*  MG  --  2.3  --   --   --   PHOS  --  3.3  --   --   --    Liver Function Tests: Recent Labs  Lab 12/25/20 0613 12/28/20 1417 12/30/20 0454  AST 40 64* 44*  ALT 40 78* 68*  ALKPHOS 44 55 50  BILITOT 1.7* 1.1 0.8  PROT 6.0* 6.4* 5.8*  ALBUMIN 3.5 3.6 3.3*   No results for input(s): LIPASE, AMYLASE in the last 168 hours. No results for input(s): AMMONIA in the last 168 hours. CBC: Recent Labs  Lab 12/25/20 0613 12/28/20 1417  WBC 8.7 11.4*  HGB 17.7* 17.4*  HCT 55.6* 53.9*  MCV 103.2* 100.9*  PLT 150 211   Cardiac Enzymes: No results for input(s): CKTOTAL, CKMB, CKMBINDEX, TROPONINI in the last 168 hours. BNP: Invalid input(s): POCBNP CBG: Recent Labs  Lab 12/29/20 1135 12/29/20 1639 12/29/20 2148 12/30/20 0817 12/30/20 1221  GLUCAP 128* 214* 251* 95 190*   D-Dimer No results for input(s): DDIMER in the last 72 hours. Hgb A1c No results for input(s): HGBA1C in the last 72 hours. Lipid Profile No results for input(s): CHOL, HDL, LDLCALC, TRIG, CHOLHDL, LDLDIRECT in the last 72 hours. Thyroid function studies No results for input(s): TSH, T4TOTAL, T3FREE, THYROIDAB in the last 72 hours.  Invalid input(s): FREET3 Anemia work up No results for input(s): VITAMINB12, FOLATE, FERRITIN, TIBC, IRON, RETICCTPCT in the last 72 hours. Urinalysis No results found for: COLORURINE, APPEARANCEUR, LABSPEC, [place], GLUCOSEU, [place], [place], [place], PROTEINUR, UROBILINOGEN, NITRITE, LEUKOCYTESUR Sepsis Labs Invalid input(s): PROCALCITONIN,  WBC,  LACTICIDVEN Microbiology Recent Results (from the past 240 hour(s))  MRSA PCR Screening     Status: None  Collection Time: 12/20/20  4:01 PM   Specimen: Nasal Mucosa; Nasopharyngeal  Result Value Ref Range Status   MRSA by PCR NEGATIVE NEGATIVE Final    Comment:        The GeneXpert MRSA Assay (FDA approved for NASAL specimens only), is  one component of a comprehensive MRSA colonization surveillance program. It is not intended to diagnose MRSA infection nor to guide or monitor treatment for MRSA infections. Performed at [facility], [place] [address], [place], [place] [address]      Time coordinating discharge: Over 30 minutes  SIGNED:   Desiree Hane, MD  [facility] 12/30/2020, 2:32 PM Pager   If 7PM-7AM, please contact night-coverage [email or web address] Password [email or web address]

## 2020-12-28 DIAGNOSIS — E861 Hypovolemia: Secondary | ICD-10-CM

## 2020-12-28 DIAGNOSIS — I959 Hypotension, unspecified: Secondary | ICD-10-CM

## 2020-12-28 DIAGNOSIS — I9589 Other hypotension: Secondary | ICD-10-CM

## 2020-12-28 LAB — CBC
HCT: 53.9 % — ABNORMAL HIGH (ref 39.0–52.0)
Hemoglobin: 17.4 g/dL — ABNORMAL HIGH (ref 13.0–17.0)
MCH: 32.6 pg (ref 26.0–34.0)
MCHC: 32.3 g/dL (ref 30.0–36.0)
MCV: 100.9 fL — ABNORMAL HIGH (ref 80.0–100.0)
Platelets: 211 10*3/uL (ref 150–400)
RBC: 5.34 MIL/uL (ref 4.22–5.81)
RDW: 12.4 % (ref 11.5–15.5)
WBC: 11.4 10*3/uL — ABNORMAL HIGH (ref 4.0–10.5)
nRBC: 0 % (ref 0.0–0.2)

## 2020-12-28 LAB — GLUCOSE, CAPILLARY
Glucose-Capillary: 119 mg/dL — ABNORMAL HIGH (ref 70–99)
Glucose-Capillary: 204 mg/dL — ABNORMAL HIGH (ref 70–99)
Glucose-Capillary: 138 mg/dL — ABNORMAL HIGH (ref 70–99)
Glucose-Capillary: 294 mg/dL — ABNORMAL HIGH (ref 70–99)

## 2020-12-28 LAB — COMPREHENSIVE METABOLIC PANEL
ALT: 78 U/L — ABNORMAL HIGH (ref 0–44)
AST: 64 U/L — ABNORMAL HIGH (ref 15–41)
Albumin: 3.6 g/dL (ref 3.5–5.0)
Alkaline Phosphatase: 55 U/L (ref 38–126)
Anion gap: 8 (ref 5–15)
BUN: 37 mg/dL — ABNORMAL HIGH (ref 8–23)
CO2: 40 mmol/L — ABNORMAL HIGH (ref 22–32)
Calcium: 9.4 mg/dL (ref 8.9–10.3)
Chloride: 88 mmol/L — ABNORMAL LOW (ref 98–111)
Creatinine, Ser: 0.97 mg/dL (ref 0.61–1.24)
GFR, Estimated: >60 mL/min (ref >60)
Glucose, Bld: 124 mg/dL — ABNORMAL HIGH (ref 70–99)
Potassium: 4.4 mmol/L (ref 3.5–5.1)
Sodium: 136 mmol/L (ref 135–145)
Total Bilirubin: 1.1 mg/dL (ref 0.3–1.2)
Total Protein: 6.4 g/dL — ABNORMAL LOW (ref 6.5–8.1)

## 2020-12-28 MED ORDER — TORSEMIDE 20 MG PO TABS
20.0000 mg | ORAL_TABLET | Freq: Two times a day (BID) | ORAL | Status: DC
  Administered 2020-12-28: 20 mg via ORAL
  Filled 2020-12-28 (×2): qty 1

## 2020-12-28 MED ORDER — LOSARTAN POTASSIUM 50 MG PO TABS
50.0000 mg | ORAL_TABLET | Freq: Every day | ORAL | Status: DC
  Administered 2020-12-28 – 2020-12-29 (×2): 50 mg via ORAL
  Filled 2020-12-28 (×2): qty 1

## 2020-12-28 NOTE — Progress Notes (Signed)
PT Cancellation Note  Patient Details Name: Robert Church MRN: [id number] DOB: 06-16-1956   Cancelled Treatment:    Reason Eval/Treat Not Completed: Medical issues which prohibited therapy. Pt with low BP in standing 76/63, will hold off on PT today.    Galen Manila 12/28/2020, 2:09 PM

## 2020-12-28 NOTE — Progress Notes (Signed)
[facility]  PROGRESS NOTE  Robert Church [id number] DOB: 1956-05-25 DOA: 12/17/2020 PCP: Janith Lima, MD Admit date - 12/17/2020   Admitting Physician Jonnie Finner, DO  Outpatient Primary MD for the patient is Janith Lima, MD  LOS - 11 Brief Narrative   Robert Church is a 65 y.o. year old male with medical history significant for Chronic systolic and diastolic CHF, nonischemic cardiomyopathy, COPD, OSA with nonadherence to CPAP, chronic venous insufficiency, alcohol/tobacco abuse who presented with cough, congestion and difficulty breathing was found to have acute hypoxic respiratory failure secondary to CHF exacerbation evident with elevated BNP and signs of pulmonary vascular congestion on chest x-ray as well as acute hypercarbic respiratory failure secondary to duration of COPD/OSA (PCO2 of 101 on ABG on admission). He required transfer to the ICU during hospital course due to need for persistent BiPAP use for acute hypercapnic respiratory failure while also being diuresed for his CHF with IV Lasix.  Subjective  Was a planned discharge from 1/7 but limited by low blood pressure that was asymptomatic with SBP's as low as 70.  A & P   Hypotension, suspect related to robust urine output on diuresis. SBP dropped to 70s with tachycardia to 120s.  In the last 24 hours net -2.4 L on oral torsemide.  Discussed with cardiology who agreed with decreasing his medications further with close monitoring of output.Adequate perfusion with normal lactic acid currently SBP's in the 90s. - Cardiology recommends decreasing torsemide to 20 mg twice daily, and losartan to 50 mg - Discontinued amlodipine -decreased to spironolactone to 12.5 mg daily --Suspect related to diuresis and his blood pressure medications as patient has no localizing signs symptoms of infection, will check CBC for hemoglobin and BMP for kidney function -continue to closely monitor blood pressure over the  next 24 hours with BP med changes --liberalize fluid restriction of 1200 cc to 1500 cc to minimize drops  Acute on chronic hypercapnic respiratory failure secondary to exacerbation of COPD and complicated by underlying OSA with poor adherence to CPAP, stable on 6 L. -Tolerating nightly BiPAP -Respiratory status stable on 6 L -At extremely high risk of repeat decompensation given multiple risk factors including chronic hypoxia, chronic lung disease, chronic heart disease with known pulmonary hypertension for which pulmonology is recommending patient have trelogy vent -We will need to remain inpatient until sure trelogy vent in place at home,  Acute hypoxic respiratory failure, secondary to COPD/CHF/OSA exacerbation. Stable. Euvolemic now, with no wheezing and tolerating BiPAP nightly. With ambulation require 6 L to maintain oxygen saturation greater than 84% with exertion -Now has trilogy vent set up at home on discharge -Oral torsemide as recommended by cardiology -Continue inhalers/steroids per home regimen -Currently on 6 L, will provide O2 on discharge  COPD, stable. No wheezing on exam. -Smoking cessation emphasized during admission as patient remains an active smoker -Continue scheduled inhalers and home dose prednisone -Resume home breztri at discharge  Acute on chronic combined diastolic CHF. Euvolemic currently, net -16 L since admission, current weight 213 pounds (1261 pounds on admission) -torsemide 40 mg twice daily per cardiology -Continue losartan, reduce dose of spironolactone, discontinued amlodipine -Will need close follow-up with heart failure team at discharge -Emphasize importance of low-salt, daily weights, close monitoring of fluids on discharge with patient and his mother  Hyperlipidemia -Continue Zetia given statin intolerance  GERD, stable -continue PPI  Gout, stable -continue allopurinol    Family Communication  : Mother updated at bedside  on 1/6  Code  Status : Full  Disposition Plan  :  Patient is from home. Anticipated d/c date:  1 to 2 days days. Barriers to d/c or necessity for inpatient status:  Still having intermittent hypotension to suspect related to medications that need further titration with close monitoring in hospital  Consults  : Cardiology, PCCM  Procedures  : TTE, 12/18/2020  DVT Prophylaxis  :  Lovenox  MDM: The below labs and imaging reports were reviewed and summarized above.  Medication management as above.  Lab Results  Component Value Date   PLT 150 12/25/2020    Diet :  Diet Order            Diet - low sodium heart healthy           Diet heart healthy/carb modified Room service appropriate? Yes; Fluid consistency: Thin; Fluid restriction: 1200 mL Fluid  Diet effective now                  Inpatient Medications Scheduled Meds: . allopurinol  300 mg Oral Daily  . chlorhexidine  15 mL Mouth Rinse BID  . Chlorhexidine Gluconate Cloth  6 each Topical Daily  . enoxaparin (LOVENOX) injection  50 mg Subcutaneous Q24H  . ezetimibe  10 mg Oral Daily  . insulin aspart  0-9 Units Subcutaneous TID WC  . losartan  50 mg Oral Daily  . mouth rinse  15 mL Mouth Rinse q12n4p  . mometasone-formoterol  2 puff Inhalation BID  . montelukast  10 mg Oral QHS  . nicotine  21 mg Transdermal Daily  . pantoprazole  40 mg Oral BID  . predniSONE  10 mg Oral Q breakfast  . roflumilast  500 mcg Oral Daily  . spironolactone  12.5 mg Oral Daily  . umeclidinium bromide  1 puff Inhalation Daily   Continuous Infusions: PRN Meds:.acetaminophen **OR** acetaminophen, albuterol, alum & mag hydroxide-simeth, LORazepam, nabumetone, ondansetron **OR** ondansetron (ZOFRAN) IV, traMADol  Antibiotics  :   Anti-infectives (From admission, onward)   Start     Dose/Rate Route Frequency Ordered Stop   12/22/20 1230  levofloxacin (LEVAQUIN) tablet 500 mg        500 mg Oral Daily 12/22/20 1127 12/24/20 0845   12/20/20 1500  levofloxacin  (LEVAQUIN) IVPB 500 mg  Status:  Discontinued        500 mg 100 mL/hr over 60 Minutes Intravenous Every 24 hours 12/20/20 1340 12/22/20 1127       Objective   Vitals:   12/28/20 1100 12/28/20 1346 12/28/20 1353 12/28/20 1357  BP: 111/76 (!) 89/66 (!) 76/63 (!) 82/71  Pulse:  (!) 113 (!) 119 (!) 122  Resp:      Temp:      TempSrc:      SpO2:      Weight:      Height:        SpO2: 90 % O2 Flow Rate (L/min): 6 L/min FiO2 (%): 40 %  Wt Readings from Last 3 Encounters:  12/28/20 96.1 kg  12/12/20 116.1 kg  12/05/20 116.1 kg     Intake/Output Summary (Last 24 hours) at 12/28/2020 1409 Last data filed at 12/28/2020 1000 Gross per 24 hour  Intake 440 ml  Output 2100 ml  Net -1660 ml    Physical Exam:   Awake Alert, Oriented X 3, Normal affect No new F.N deficits,  .AT, Normal respiratory effort on 6 L, CTAB RRR,No Gallops,Rubs or new Murmurs, no peripheral edema +  ve B.Sounds, Abd Soft, No tenderness, No rebound, guarding or rigidity. No Cyanosis, No new Rash or bruise     I have personally reviewed the following:   Data Reviewed:  CBC Recent Labs  Lab 12/22/20 0245 12/23/20 0330 12/25/20 0613  WBC 7.3 8.2 8.7  HGB 17.3* 17.9* 17.7*  HCT 56.0* 56.2* 55.6*  PLT 136* 176 150  MCV 104.5* 102.7* 103.2*  MCH 32.3 32.7 32.8  MCHC 30.9 31.9 31.8  RDW 12.1 12.3 12.4    Chemistries  Recent Labs  Lab 12/22/20 0245 12/23/20 0330 12/24/20 0411 12/25/20 0613 12/26/20 1008  NA 143 141  --  137 136  K 4.0 3.5  --  3.7 4.0  CL 83* 82*  --  81* 83*  CO2 45* 45*  --  43* 40*  GLUCOSE 117* 132*  --  112* 244*  BUN 38* 38*  --  39* 47*  CREATININE 0.94 0.96 1.01 1.14 0.97  CALCIUM 9.2 9.1  --  8.5* 8.7*  MG 2.2 2.3  --  2.3  --   AST  --   --   --  40  --   ALT  --   --   --  40  --   ALKPHOS  --   --   --  44  --   BILITOT  --   --   --  1.7*  --     ------------------------------------------------------------------------------------------------------------------ No results for input(s): CHOL, HDL, LDLCALC, TRIG, CHOLHDL, LDLDIRECT in the last 72 hours.  Lab Results  Component Value Date   HGBA1C 6.6 (H) 11/28/2020   ------------------------------------------------------------------------------------------------------------------ No results for input(s): TSH, T4TOTAL, T3FREE, THYROIDAB in the last 72 hours.  Invalid input(s): FREET3 ------------------------------------------------------------------------------------------------------------------ No results for input(s): VITAMINB12, FOLATE, FERRITIN, TIBC, IRON, RETICCTPCT in the last 72 hours.  Coagulation profile No results for input(s): INR, PROTIME in the last 168 hours.  No results for input(s): DDIMER in the last 72 hours.  Cardiac Enzymes No results for input(s): CKMB, TROPONINI, MYOGLOBIN in the last 168 hours.  Invalid input(s): CK ------------------------------------------------------------------------------------------------------------------    Component Value Date/Time   BNP 1,374.9 (H) 12/17/2020 1134    Micro Results Recent Results (from the past 240 hour(s))  MRSA PCR Screening     Status: None   Collection Time: 12/20/20  4:01 PM   Specimen: Nasal Mucosa; Nasopharyngeal  Result Value Ref Range Status   MRSA by PCR NEGATIVE NEGATIVE Final    Comment:        The GeneXpert MRSA Assay (FDA approved for NASAL specimens only), is one component of a comprehensive MRSA colonization surveillance program. It is not intended to diagnose MRSA infection nor to guide or monitor treatment for MRSA infections. Performed at [facility], [place] [address] [place], [place] [address]     Radiology Reports DG Chest 2 View  Result Date: 12/17/2020 CLINICAL DATA:  Shortness of breath EXAM: CHEST - 2 VIEW COMPARISON:  June 27, 2018 FINDINGS:  There is mild left midlung atelectasis. No edema or airspace opacity. There is mild cardiomegaly with a degree of pulmonary venous hypertension. No evident adenopathy. No bone lesions. IMPRESSION: Mild cardiomegaly with pulmonary vascular congestion. No edema or consolidation. Mild left midlung atelectatic change. Electronically Signed   By: Lowella Grip III M.D.   On: 12/17/2020 11:22   ECHOCARDIOGRAM COMPLETE  Result Date: 12/18/2020    ECHOCARDIOGRAM REPORT   Patient Name:   Robert Church Date of Exam: 12/18/2020 Medical Rec #:  [id number]           Height:       70.0 in Accession #:    [id number]          Weight:       255.5 lb Date of Birth:  [birth year]            BSA:          2.316 m Patient Age:    64 years            BP:           137/94 mmHg Patient Gender: M                   HR:           111 bpm. Exam Location:  Inpatient Procedure: 2D Echo, Color Doppler and Cardiac Doppler Indications:    I50.21 Acute systolic (congestive) heart failure  History:        Patient has prior history of Echocardiogram examinations, most                 recent 11/30/2019. CHF, COPD; Risk Factors:Hypertension,                 Dyslipidemia and Sleep Apnea.  Sonographer:    Irving Burton Senior RDCS Referring Phys: [id number] Teddy Spike  Sonographer Comments: Scanned upright due to dyspnea. IMPRESSIONS  1. Left ventricular ejection fraction, by estimation, is 40 to 45%. The left ventricle has mildly decreased function. The left ventricle demonstrates global hypokinesis. The left ventricular internal cavity size was moderately dilated. There is mild left ventricular hypertrophy. Left ventricular diastolic parameters are consistent with Grade II diastolic dysfunction (pseudonormalization). Elevated left atrial pressure.  2. Right ventricular systolic function is mildly reduced. The right ventricular size is severely enlarged. There is moderately elevated pulmonary artery systolic pressure. The estimated right ventricular  systolic pressure is 49.1 mmHg.  3. Left atrial size was mildly dilated.  4. Right atrial size was mildly dilated.  5. The mitral valve is normal in structure. No evidence of mitral valve regurgitation.  6. The aortic valve was not well visualized. Aortic valve regurgitation is not visualized. No aortic stenosis is present.  7. There is mild dilatation of the ascending aorta, measuring 37 mm.  8. The inferior vena cava is dilated in size with <50% respiratory variability, suggesting right atrial pressure of 15 mmHg. FINDINGS  Left Ventricle: Left ventricular ejection fraction, by estimation, is 40 to 45%. The left ventricle has mildly decreased function. The left ventricle demonstrates global hypokinesis. The left ventricular internal cavity size was moderately dilated. There is mild left ventricular hypertrophy. Left ventricular diastolic parameters are consistent with Grade II diastolic dysfunction (pseudonormalization). Elevated left atrial pressure. Right Ventricle: The right ventricular size is severely enlarged. Right vetricular wall thickness was not assessed. Right ventricular systolic function is mildly reduced. There is moderately elevated pulmonary artery systolic pressure. The tricuspid regurgitant velocity is 2.92 m/s, and with an assumed right atrial pressure of 15 mmHg, the estimated right ventricular systolic pressure is 49.1 mmHg. Left Atrium: Left atrial size was mildly dilated. Right Atrium: Right atrial size was mildly dilated. Pericardium: Trivial pericardial effusion is present. Mitral Valve: The mitral valve is normal in structure. No evidence of mitral valve regurgitation. Tricuspid Valve: The tricuspid valve is normal in structure. Tricuspid valve regurgitation is trivial. Aortic Valve: The aortic valve was not well visualized. Aortic valve regurgitation is not visualized. No aortic stenosis  is present. Pulmonic Valve: The pulmonic valve was not well visualized. Pulmonic valve regurgitation  is not visualized. Aorta: The aortic root is normal in size and structure. There is mild dilatation of the ascending aorta, measuring 37 mm. Venous: The inferior vena cava is dilated in size with less than 50% respiratory variability, suggesting right atrial pressure of 15 mmHg. IAS/Shunts: The interatrial septum was not well visualized.  LEFT VENTRICLE PLAX 2D LVIDd:         6.00 cm      Diastology LVIDs:         4.40 cm      LV e' medial:  3.48 cm/s LV PW:         1.20 cm      LV e' lateral: 4.13 cm/s LV IVS:        1.20 cm LVOT diam:     2.20 cm LV SV:         63 LV SV Index:   27 LVOT Area:     3.80 cm  LV Volumes (MOD) LV vol d, MOD A2C: 149.0 ml LV vol d, MOD A4C: 140.0 ml LV vol s, MOD A2C: 90.6 ml LV vol s, MOD A4C: 82.9 ml LV SV MOD A2C:     58.4 ml LV SV MOD A4C:     140.0 ml LV SV MOD BP:      59.3 ml RIGHT VENTRICLE RV S prime:     13.90 cm/s TAPSE (M-mode): 2.4 cm LEFT ATRIUM           Index       RIGHT ATRIUM           Index LA diam:      4.20 cm 1.81 cm/m  RA Area:     22.70 cm LA Vol (A2C): 78.6 ml 33.93 ml/m RA Volume:   73.20 ml  31.60 ml/m LA Vol (A4C): 68.3 ml 29.49 ml/m  AORTIC VALVE LVOT Vmax:   112.03 cm/s LVOT Vmean:  84.833 cm/s LVOT VTI:    0.165 m  AORTA Ao Root diam: 3.60 cm Ao Asc diam:  3.70 cm TRICUSPID VALVE TR Peak grad:   34.1 mmHg TR Vmax:        292.00 cm/s  SHUNTS Systemic VTI:  0.16 m Systemic Diam: 2.20 cm Oswaldo Milian MD Electronically signed by Oswaldo Milian MD Signature Date/Time: 12/18/2020/12:52:30 PM    Final    VAS [place] LOWER EXTREMITY VENOUS REFLUX  Result Date: 12/12/2020  Lower Venous Reflux Study Indications: Pain, Swelling, Edema, and Gout.  Performing Technologist: Delorise Shiner RVT  Examination Guidelines: A complete evaluation includes B-mode imaging, spectral Doppler, color Doppler, and power Doppler as needed of all accessible portions of each vessel. Bilateral testing is considered an integral part of a complete examination. Limited  examinations for reoccurring indications may be performed as noted. The reflux portion of the exam is performed with the patient in reverse Trendelenburg. Significant venous reflux is defined as >500 ms in the superficial venous system, and >1 second in the deep venous system.  Venous Reflux Times +--------------+---------+------+-----------+------------+--------+ RIGHT         Reflux NoRefluxReflux TimeDiameter cmsComments                         Yes                                  +--------------+---------+------+-----------+------------+--------+  CFV           no                                             +--------------+---------+------+-----------+------------+--------+ FV prox       no                                             +--------------+---------+------+-----------+------------+--------+ FV mid        no                                             +--------------+---------+------+-----------+------------+--------+ FV dist       no                                             +--------------+---------+------+-----------+------------+--------+ Popliteal     no                                             +--------------+---------+------+-----------+------------+--------+ GSV at [facility]    no                           0.667             +--------------+---------+------+-----------+------------+--------+ GSV prox thighno                           0.637             +--------------+---------+------+-----------+------------+--------+ GSV mid thigh no                           0.584             +--------------+---------+------+-----------+------------+--------+ GSV dist thighno                           0.524             +--------------+---------+------+-----------+------------+--------+ GSV at knee   no                           0.553             +--------------+---------+------+-----------+------------+--------+ GSV prox calf                               0.467             +--------------+---------+------+-----------+------------+--------+ GSV mid calf                               0.445             +--------------+---------+------+-----------+------------+--------+ SSV Pop Fossa no  0.577             +--------------+---------+------+-----------+------------+--------+ SSV prox calf no                           0.326             +--------------+---------+------+-----------+------------+--------+ SSV mid calf                               0.425             +--------------+---------+------+-----------+------------+--------+  +--------------+---------+------+-----------+------------+--------+ LEFT          Reflux NoRefluxReflux TimeDiameter cmsComments                         Yes                                  +--------------+---------+------+-----------+------------+--------+ CFV           no                                             +--------------+---------+------+-----------+------------+--------+ FV prox       no                                             +--------------+---------+------+-----------+------------+--------+ FV mid        no                                             +--------------+---------+------+-----------+------------+--------+ FV dist       no                                             +--------------+---------+------+-----------+------------+--------+ Popliteal     no                                             +--------------+---------+------+-----------+------------+--------+ GSV at [facility]    no                           0.653             +--------------+---------+------+-----------+------------+--------+ GSV prox thighno                           0.424             +--------------+---------+------+-----------+------------+--------+ GSV mid thigh no                           0.467              +--------------+---------+------+-----------+------------+--------+ GSV dist thighno  0.504             +--------------+---------+------+-----------+------------+--------+ GSV at knee   no                           0.467             +--------------+---------+------+-----------+------------+--------+ GSV prox calf                              0.361             +--------------+---------+------+-----------+------------+--------+ GSV mid calf                               0.392             +--------------+---------+------+-----------+------------+--------+ SSV Pop Fossa no                           0.744             +--------------+---------+------+-----------+------------+--------+ SSV prox calf no                           0.468             +--------------+---------+------+-----------+------------+--------+ SSV mid calf                               0.394             +--------------+---------+------+-----------+------------+--------+   Summary: Bilateral: - No evidence of deep vein thrombosis seen in the lower extremities, bilaterally, from the common femoral through the popliteal veins. - No evidence of superficial venous thrombosis in the lower extremities, bilaterally. - No evidence of deep venous insufficiency seen bilaterally in the lower extremity. - No evidence of superficial venous reflux seen in the greater saphenous veins bilaterally. - No evidence of superficial venous reflux seen in the short saphenous veins bilaterally.  *See table(s) above for measurements and observations. Electronically signed by Deitra Mayo MD on 12/12/2020 at 3:40:43 PM.    Final      Time Spent in minutes  30     Desiree Hane M.D on 12/28/2020 at 2:09 PM  To page go to [email or web address] - password [facility]

## 2020-12-29 LAB — GLUCOSE, CAPILLARY
Glucose-Capillary: 101 mg/dL — ABNORMAL HIGH (ref 70–99)
Glucose-Capillary: 128 mg/dL — ABNORMAL HIGH (ref 70–99)
Glucose-Capillary: 214 mg/dL — ABNORMAL HIGH (ref 70–99)
Glucose-Capillary: 251 mg/dL — ABNORMAL HIGH (ref 70–99)

## 2020-12-29 MED ORDER — POLYETHYLENE GLYCOL 3350 17 G PO PACK
17.0000 g | PACK | Freq: Every day | ORAL | Status: DC
  Administered 2020-12-29 – 2020-12-30 (×2): 17 g via ORAL
  Filled 2020-12-29 (×2): qty 1

## 2020-12-29 MED ORDER — TORSEMIDE 10 MG PO TABS
10.0000 mg | ORAL_TABLET | ORAL | Status: DC
  Administered 2020-12-29: 10 mg via ORAL
  Filled 2020-12-29: qty 1

## 2020-12-29 MED ORDER — TORSEMIDE 20 MG PO TABS
20.0000 mg | ORAL_TABLET | Freq: Every day | ORAL | Status: DC
  Filled 2020-12-29: qty 1

## 2020-12-29 NOTE — Progress Notes (Signed)
Placed on cpap 6L o2 bled in

## 2020-12-29 NOTE — Progress Notes (Signed)
[facility]  PROGRESS NOTE  Robert Church [id number] DOB: [birth year] DOA: 12/17/2020 PCP: Janith Lima, MD Admit date - 12/17/2020   Admitting Physician Jonnie Finner, DO  Outpatient Primary MD for the patient is Janith Lima, MD  LOS - 12 Brief Narrative   Robert Church is a 65 y.o. year old male with medical history significant for Chronic systolic and diastolic CHF, nonischemic cardiomyopathy, COPD, OSA with nonadherence to CPAP, chronic venous insufficiency, alcohol/tobacco abuse who presented with cough, congestion and difficulty breathing was found to have acute hypoxic respiratory failure secondary to CHF exacerbation evident with elevated BNP and signs of pulmonary vascular congestion on chest x-ray as well as acute hypercarbic respiratory failure secondary to duration of COPD/OSA (PCO2 of 101 on ABG on admission). He required transfer to the ICU during hospital course due to need for persistent BiPAP use for acute hypercapnic respiratory failure while also being diuresed for his CHF with IV Lasix.  Subjective  Continues to deny any chest pain, no shortness of breath, no cough. Really wants to leave  Later in the afternoon meeting him had a long discussion as patient wants to leave against medical advice is frustrated with having his blood pressure drop each time he takes reduced dose of torsemide  A & P   Hypotension, suspect related to robust urine output on diuresis adequate and inabilityy to tolerate current regimen of torsemide and blood pressure medications.  SBP dropped to 70s with tachycardia to 120s on 1/8 which prompted decrease of losartan to 50 mg and reduction of torsemide to 20 mg twice BID but again had low BP on 1/8 several hours later. I discussed with on-call cardiologist this morning on 1/9 who advised trialing torsemide low-dose 10 mg twice a day every other day with close monitoring of blood pressure.  But again this afternoon patient's SBP  dropped in the low 80s to 70s with standing and remained asymptomatic.Adequate perfusion with normal lactic acid currently SBP's in the 90s. -in discussion with on call cardiologist on 1/9 decreased torsemide to 10 mg twice daily every other day but when decreased still had low BP a few hours later this afternoon -- We will again hold on afternoon diuresis to avoid further low BPs \ --Losartan 50 mg (decreased on 1/8) - Discontinued amlodipine -decreased to spironolactone to 12.5 mg daily(1/6) --Suspect related to diuresis and his blood pressure medications as patient has no localizing signs symptoms of infection, no drop in hgb and kidney function at baseline -continue to closely monitor blood pressure over the next 24 hours with BP med changes --liberalized fluid restriction to 00 cc to minimize drops --plan on formally reconsulting cardiology in am given persistent hypotension despite changes  Acute on chronic hypercapnic respiratory failure secondary to exacerbation of COPD and complicated by underlying OSA with poor adherence to CPAP, stable on 6 L. -Tolerating nightly BiPAP -Respiratory status stable on 6 L -At extremely high risk of repeat decompensation given multiple risk factors including chronic hypoxia, chronic lung disease, chronic heart disease with known pulmonary hypertension for which pulmonology is recommending patient have trelogy vent -We will need to remain inpatient until sure trelogy vent in place at home,  Acute hypoxic respiratory failure, secondary to COPD/CHF/OSA exacerbation. Stable. Euvolemic now, with no wheezing and tolerating BiPAP nightly. With ambulation require 6 L to maintain oxygen saturation greater than 84% with exertion -Now has trilogy vent set up at home on discharge -Oral torsemide as recommended by cardiology -  Continue inhalers/steroids per home regimen -Currently on 6 L, will provide O2 on discharge  COPD, stable. No wheezing on exam. -Smoking  cessation emphasized during admission as patient remains an active smoker -Continue scheduled inhalers and home dose prednisone -Resume home breztri at discharge  Acute on chronic combined diastolic CHF. Euvolemic currently, net -23L since admission, current weight 211 pounds (261 pounds on admission) Again 2.3 L out in last 24 hours on only torsemide 20 mg once with spirinolactone -reduced torsemide 10 mg  BID every other day, holding on further diuresis today -Continue reduce dose of losartan and spironolactone, discontinued amlodipine -Will need close follow-up with heart failure team at discharge -Emphasize importance of low-salt, daily weights, close monitoring of fluids on discharge with patient and his mother  Hyperlipidemia -Continue Zetia given statin intolerance  GERD, stable -continue PPI  Gout, stable -continue allopurinol    Family Communication  : Mother updated at bedside on 1/9  Code Status : Full  Disposition Plan  :  Patient is from home. Anticipated d/c date:  1 to 2 days . Barriers to d/c or necessity for inpatient status:  Still having intermittent hypotension to suspect related to medications that need further titration with close monitoring in hospital  Consults  : Cardiology, PCCM  Procedures  : TTE, 12/18/2020  DVT Prophylaxis  :  Lovenox  MDM: The below labs and imaging reports were reviewed and summarized above.  Medication management as above.  Lab Results  Component Value Date   PLT 211 12/28/2020    Diet :  Diet Order            Diet heart healthy/carb modified Room service appropriate? Yes; Fluid consistency: Thin; Fluid restriction: 1500 mL Fluid  Diet effective now           Diet - low sodium heart healthy                  Inpatient Medications Scheduled Meds: . allopurinol  300 mg Oral Daily  . chlorhexidine  15 mL Mouth Rinse BID  . Chlorhexidine Gluconate Cloth  6 each Topical Daily  . enoxaparin (LOVENOX) injection  50 mg  Subcutaneous Q24H  . ezetimibe  10 mg Oral Daily  . insulin aspart  0-9 Units Subcutaneous TID WC  . losartan  50 mg Oral Daily  . mouth rinse  15 mL Mouth Rinse q12n4p  . mometasone-formoterol  2 puff Inhalation BID  . montelukast  10 mg Oral QHS  . nicotine  21 mg Transdermal Daily  . pantoprazole  40 mg Oral BID  . polyethylene glycol  17 g Oral Daily  . predniSONE  10 mg Oral Q breakfast  . roflumilast  500 mcg Oral Daily  . spironolactone  12.5 mg Oral Daily  . torsemide  10 mg Oral QODAY  . umeclidinium bromide  1 puff Inhalation Daily   Continuous Infusions: PRN Meds:.acetaminophen **OR** acetaminophen, albuterol, alum & mag hydroxide-simeth, LORazepam, nabumetone, ondansetron **OR** ondansetron (ZOFRAN) IV, traMADol  Antibiotics  :   Anti-infectives (From admission, onward)   Start     Dose/Rate Route Frequency Ordered Stop   12/22/20 1230  levofloxacin (LEVAQUIN) tablet 500 mg        500 mg Oral Daily 12/22/20 1127 12/24/20 0845   12/20/20 1500  levofloxacin (LEVAQUIN) IVPB 500 mg  Status:  Discontinued        500 mg 100 mL/hr over 60 Minutes Intravenous Every 24 hours 12/20/20 1340 12/22/20 1127  Objective   Vitals:   12/28/20 1723 12/28/20 2048 12/29/20 0417 12/29/20 1137  BP: 123/75 104/73 91/77 (!) 141/114  Pulse: 95 98 (!) 54 91  Resp: 20 20 20 20   Temp: 98 F (36.7 C) 98.3 F (36.8 C) 98.3 F (36.8 C) 98 F (36.7 C)  TempSrc: Oral Oral Oral Oral  SpO2: 91% 94% 90% 91%  Weight:   95.6 kg   Height:        SpO2: 91 % O2 Flow Rate (L/min): 6 L/min FiO2 (%): 40 %  Wt Readings from Last 3 Encounters:  12/29/20 95.6 kg  12/12/20 116.1 kg  12/05/20 116.1 kg     Intake/Output Summary (Last 24 hours) at 12/29/2020 1704 Last data filed at 12/29/2020 1300 Gross per 24 hour  Intake 240 ml  Output 1825 ml  Net -1585 ml    Physical Exam:   Awake Alert, Oriented X 3, Normal affect No new F.N deficits,  [place].AT, Normal respiratory effort on 6 L,  CTAB RRR,No Gallops,Rubs or new Murmurs, no peripheral edema +ve B.Sounds, Abd Soft, No tenderness, No rebound, guarding or rigidity. No Cyanosis, No new Rash or bruise     I have personally reviewed the following:   Data Reviewed:  CBC Recent Labs  Lab 12/23/20 0330 12/25/20 0613 12/28/20 1417  WBC 8.2 8.7 11.4*  HGB 17.9* 17.7* 17.4*  HCT 56.2* 55.6* 53.9*  PLT 176 150 211  MCV 102.7* 103.2* 100.9*  MCH 32.7 32.8 32.6  MCHC 31.9 31.8 32.3  RDW 12.3 12.4 12.4    Chemistries  Recent Labs  Lab 12/23/20 0330 12/24/20 0411 12/25/20 0613 12/26/20 1008 12/28/20 1417  NA 141  --  137 136 136  K 3.5  --  3.7 4.0 4.4  CL 82*  --  81* 83* 88*  CO2 45*  --  43* 40* 40*  GLUCOSE 132*  --  112* 244* 124*  BUN 38*  --  39* 47* 37*  CREATININE 0.96 1.01 1.14 0.97 0.97  CALCIUM 9.1  --  8.5* 8.7* 9.4  MG 2.3  --  2.3  --   --   AST  --   --  40  --  64*  ALT  --   --  40  --  78*  ALKPHOS  --   --  44  --  55  BILITOT  --   --  1.7*  --  1.1   ------------------------------------------------------------------------------------------------------------------ No results for input(s): CHOL, HDL, LDLCALC, TRIG, CHOLHDL, LDLDIRECT in the last 72 hours.  Lab Results  Component Value Date   HGBA1C 6.6 (H) 11/28/2020   ------------------------------------------------------------------------------------------------------------------ No results for input(s): TSH, T4TOTAL, T3FREE, THYROIDAB in the last 72 hours.  Invalid input(s): FREET3 ------------------------------------------------------------------------------------------------------------------ No results for input(s): VITAMINB12, FOLATE, FERRITIN, TIBC, IRON, RETICCTPCT in the last 72 hours.  Coagulation profile No results for input(s): INR, PROTIME in the last 168 hours.  No results for input(s): DDIMER in the last 72 hours.  Cardiac Enzymes No results for input(s): CKMB, TROPONINI, MYOGLOBIN in the last 168  hours.  Invalid input(s): CK ------------------------------------------------------------------------------------------------------------------    Component Value Date/Time   BNP 1,374.9 (H) 12/17/2020 1134    Micro Results Recent Results (from the past 240 hour(s))  MRSA PCR Screening     Status: None   Collection Time: 12/20/20  4:01 PM   Specimen: Nasal Mucosa; Nasopharyngeal  Result Value Ref Range Status   MRSA by PCR NEGATIVE NEGATIVE Final    Comment:  The GeneXpert MRSA Assay (FDA approved for NASAL specimens only), is one component of a comprehensive MRSA colonization surveillance program. It is not intended to diagnose MRSA infection nor to guide or monitor treatment for MRSA infections. Performed at [facility], [place] [address], [place], [place] [address]     Radiology Reports DG Chest 2 View  Result Date: 12/17/2020 CLINICAL DATA:  Shortness of breath EXAM: CHEST - 2 VIEW COMPARISON:  June 27, 2018 FINDINGS: There is mild left midlung atelectasis. No edema or airspace opacity. There is mild cardiomegaly with a degree of pulmonary venous hypertension. No evident adenopathy. No bone lesions. IMPRESSION: Mild cardiomegaly with pulmonary vascular congestion. No edema or consolidation. Mild left midlung atelectatic change. Electronically Signed   By: Lowella Grip III M.D.   On: 12/17/2020 11:22   ECHOCARDIOGRAM COMPLETE  Result Date: 12/18/2020    ECHOCARDIOGRAM REPORT   Patient Name:   TRAVANTE CANAS Date of Exam: 12/18/2020 Medical Rec #:  [id number]           Height:       70.0 in Accession #:    [id number]          Weight:       255.5 lb Date of Birth:  January 19, 1956            BSA:          2.316 m Patient Age:    23 years            BP:           137/94 mmHg Patient Gender: M                   HR:           111 bpm. Exam Location:  Inpatient Procedure: 2D Echo, Color Doppler and Cardiac Doppler Indications:    [id number] Acute systolic  (congestive) heart failure  History:        Patient has prior history of Echocardiogram examinations, most                 recent 11/30/2019. CHF, COPD; Risk Factors:Hypertension,                 Dyslipidemia and Sleep Apnea.  Sonographer:    Raquel Sarna Senior RDCS Referring Phys: [id number] Jonnie Finner  Sonographer Comments: Scanned upright due to dyspnea. IMPRESSIONS  1. Left ventricular ejection fraction, by estimation, is 40 to 45%. The left ventricle has mildly decreased function. The left ventricle demonstrates global hypokinesis. The left ventricular internal cavity size was moderately dilated. There is mild left ventricular hypertrophy. Left ventricular diastolic parameters are consistent with Grade II diastolic dysfunction (pseudonormalization). Elevated left atrial pressure.  2. Right ventricular systolic function is mildly reduced. The right ventricular size is severely enlarged. There is moderately elevated pulmonary artery systolic pressure. The estimated right ventricular systolic pressure is 123456 mmHg.  3. Left atrial size was mildly dilated.  4. Right atrial size was mildly dilated.  5. The mitral valve is normal in structure. No evidence of mitral valve regurgitation.  6. The aortic valve was not well visualized. Aortic valve regurgitation is not visualized. No aortic stenosis is present.  7. There is mild dilatation of the ascending aorta, measuring 37 mm.  8. The inferior vena cava is dilated in size with <50% respiratory variability, suggesting right atrial pressure of 15 mmHg. FINDINGS  Left Ventricle: Left ventricular ejection fraction, by estimation, is 40 to 45%.  The left ventricle has mildly decreased function. The left ventricle demonstrates global hypokinesis. The left ventricular internal cavity size was moderately dilated. There is mild left ventricular hypertrophy. Left ventricular diastolic parameters are consistent with Grade II diastolic dysfunction (pseudonormalization). Elevated left  atrial pressure. Right Ventricle: The right ventricular size is severely enlarged. Right vetricular wall thickness was not assessed. Right ventricular systolic function is mildly reduced. There is moderately elevated pulmonary artery systolic pressure. The tricuspid regurgitant velocity is 2.92 m/s, and with an assumed right atrial pressure of 15 mmHg, the estimated right ventricular systolic pressure is 25.4 mmHg. Left Atrium: Left atrial size was mildly dilated. Right Atrium: Right atrial size was mildly dilated. Pericardium: Trivial pericardial effusion is present. Mitral Valve: The mitral valve is normal in structure. No evidence of mitral valve regurgitation. Tricuspid Valve: The tricuspid valve is normal in structure. Tricuspid valve regurgitation is trivial. Aortic Valve: The aortic valve was not well visualized. Aortic valve regurgitation is not visualized. No aortic stenosis is present. Pulmonic Valve: The pulmonic valve was not well visualized. Pulmonic valve regurgitation is not visualized. Aorta: The aortic root is normal in size and structure. There is mild dilatation of the ascending aorta, measuring 37 mm. Venous: The inferior vena cava is dilated in size with less than 50% respiratory variability, suggesting right atrial pressure of 15 mmHg. IAS/Shunts: The interatrial septum was not well visualized.  LEFT VENTRICLE PLAX 2D LVIDd:         6.00 cm      Diastology LVIDs:         4.40 cm      LV e' medial:  3.48 cm/s LV PW:         1.20 cm      LV e' lateral: 4.13 cm/s LV IVS:        1.20 cm LVOT diam:     2.20 cm LV SV:         63 LV SV Index:   27 LVOT Area:     3.80 cm  LV Volumes (MOD) LV vol d, MOD A2C: 149.0 ml LV vol d, MOD A4C: 140.0 ml LV vol s, MOD A2C: 90.6 ml LV vol s, MOD A4C: 82.9 ml LV SV MOD A2C:     58.4 ml LV SV MOD A4C:     140.0 ml LV SV MOD BP:      59.3 ml RIGHT VENTRICLE RV S prime:     13.90 cm/s TAPSE (M-mode): 2.4 cm LEFT ATRIUM           Index       RIGHT ATRIUM            Index LA diam:      4.20 cm 1.81 cm/m  RA Area:     22.70 cm LA Vol (A2C): 78.6 ml 33.93 ml/m RA Volume:   73.20 ml  31.60 ml/m LA Vol (A4C): 68.3 ml 29.49 ml/m  AORTIC VALVE LVOT Vmax:   112.03 cm/s LVOT Vmean:  84.833 cm/s LVOT VTI:    0.165 m  AORTA Ao Root diam: 3.60 cm Ao Asc diam:  3.70 cm TRICUSPID VALVE TR Peak grad:   34.1 mmHg TR Vmax:        292.00 cm/s  SHUNTS Systemic VTI:  0.16 m Systemic Diam: 2.20 cm Oswaldo Milian MD Electronically signed by Oswaldo Milian MD Signature Date/Time: 12/18/2020/12:52:30 PM    Final    VAS [place] LOWER EXTREMITY VENOUS REFLUX  Result Date: 12/12/2020  Lower  Venous Reflux Study Indications: Pain, Swelling, Edema, and Gout.  Performing Technologist: Delorise Shiner RVT  Examination Guidelines: A complete evaluation includes B-mode imaging, spectral Doppler, color Doppler, and power Doppler as needed of all accessible portions of each vessel. Bilateral testing is considered an integral part of a complete examination. Limited examinations for reoccurring indications may be performed as noted. The reflux portion of the exam is performed with the patient in reverse Trendelenburg. Significant venous reflux is defined as >500 ms in the superficial venous system, and >1 second in the Robert venous system.  Venous Reflux Times +--------------+---------+------+-----------+------------+--------+ RIGHT         Reflux NoRefluxReflux TimeDiameter cmsComments                         Yes                                  +--------------+---------+------+-----------+------------+--------+ CFV           no                                             +--------------+---------+------+-----------+------------+--------+ FV prox       no                                             +--------------+---------+------+-----------+------------+--------+ FV mid        no                                              +--------------+---------+------+-----------+------------+--------+ FV dist       no                                             +--------------+---------+------+-----------+------------+--------+ Popliteal     no                                             +--------------+---------+------+-----------+------------+--------+ GSV at [facility]    no                           0.667             +--------------+---------+------+-----------+------------+--------+ GSV prox thighno                           0.637             +--------------+---------+------+-----------+------------+--------+ GSV mid thigh no                           0.584             +--------------+---------+------+-----------+------------+--------+ GSV dist thighno  0.524             +--------------+---------+------+-----------+------------+--------+ GSV at knee   no                           0.553             +--------------+---------+------+-----------+------------+--------+ GSV prox calf                              0.467             +--------------+---------+------+-----------+------------+--------+ GSV mid calf                               0.445             +--------------+---------+------+-----------+------------+--------+ SSV Pop Fossa no                           0.577             +--------------+---------+------+-----------+------------+--------+ SSV prox calf no                           0.326             +--------------+---------+------+-----------+------------+--------+ SSV mid calf                               0.425             +--------------+---------+------+-----------+------------+--------+  +--------------+---------+------+-----------+------------+--------+ LEFT          Reflux NoRefluxReflux TimeDiameter cmsComments                         Yes                                   +--------------+---------+------+-----------+------------+--------+ CFV           no                                             +--------------+---------+------+-----------+------------+--------+ FV prox       no                                             +--------------+---------+------+-----------+------------+--------+ FV mid        no                                             +--------------+---------+------+-----------+------------+--------+ FV dist       no                                             +--------------+---------+------+-----------+------------+--------+ Popliteal     no                                             +--------------+---------+------+-----------+------------+--------+  GSV at [facility]    no                           0.653             +--------------+---------+------+-----------+------------+--------+ GSV prox thighno                           0.424             +--------------+---------+------+-----------+------------+--------+ GSV mid thigh no                           0.467             +--------------+---------+------+-----------+------------+--------+ GSV dist thighno                           0.504             +--------------+---------+------+-----------+------------+--------+ GSV at knee   no                           0.467             +--------------+---------+------+-----------+------------+--------+ GSV prox calf                              0.361             +--------------+---------+------+-----------+------------+--------+ GSV mid calf                               0.392             +--------------+---------+------+-----------+------------+--------+ SSV Pop Fossa no                           0.744             +--------------+---------+------+-----------+------------+--------+ SSV prox calf no                           0.468              +--------------+---------+------+-----------+------------+--------+ SSV mid calf                               0.394             +--------------+---------+------+-----------+------------+--------+   Summary: Bilateral: - No evidence of Robert vein thrombosis seen in the lower extremities, bilaterally, from the common femoral through the popliteal veins. - No evidence of superficial venous thrombosis in the lower extremities, bilaterally. - No evidence of Robert venous insufficiency seen bilaterally in the lower extremity. - No evidence of superficial venous reflux seen in the greater saphenous veins bilaterally. - No evidence of superficial venous reflux seen in the short saphenous veins bilaterally.  *See table(s) above for measurements and observations. Electronically signed by Deitra Mayo MD on 12/12/2020 at 3:40:43 PM.    Final      Time Spent in minutes  30     Desiree Hane M.D on 12/29/2020 at 5:04 PM  To page go to [email or web address] - password [facility]

## 2020-12-30 LAB — COMPREHENSIVE METABOLIC PANEL
ALT: 68 U/L — ABNORMAL HIGH (ref 0–44)
AST: 44 U/L — ABNORMAL HIGH (ref 15–41)
Albumin: 3.3 g/dL — ABNORMAL LOW (ref 3.5–5.0)
Alkaline Phosphatase: 50 U/L (ref 38–126)
Anion gap: 8 (ref 5–15)
BUN: 27 mg/dL — ABNORMAL HIGH (ref 8–23)
CO2: 32 mmol/L (ref 22–32)
Calcium: 8.8 mg/dL — ABNORMAL LOW (ref 8.9–10.3)
Chloride: 96 mmol/L — ABNORMAL LOW (ref 98–111)
Creatinine, Ser: 0.87 mg/dL (ref 0.61–1.24)
GFR, Estimated: >60 mL/min (ref >60)
Glucose, Bld: 147 mg/dL — ABNORMAL HIGH (ref 70–99)
Potassium: 4.5 mmol/L (ref 3.5–5.1)
Sodium: 136 mmol/L (ref 135–145)
Total Bilirubin: 0.8 mg/dL (ref 0.3–1.2)
Total Protein: 5.8 g/dL — ABNORMAL LOW (ref 6.5–8.1)

## 2020-12-30 LAB — GLUCOSE, CAPILLARY
Glucose-Capillary: 95 mg/dL (ref 70–99)
Glucose-Capillary: 190 mg/dL — ABNORMAL HIGH (ref 70–99)

## 2020-12-30 MED ORDER — LOSARTAN POTASSIUM 25 MG PO TABS: 25.0000 mg | ORAL_TABLET | Freq: Every day | ORAL | Status: DC

## 2020-12-30 MED ORDER — TORSEMIDE 10 MG PO TABS: 10.0000 mg | ORAL_TABLET | Freq: Every day | ORAL | 1 refills | Status: DC

## 2020-12-30 MED ORDER — POLYETHYLENE GLYCOL 3350 17 G PO PACK: 17.0000 g | PACK | Freq: Every day | ORAL | 0 refills | Status: DC | PRN

## 2020-12-30 MED ORDER — TORSEMIDE 10 MG PO TABS
10.0000 mg | ORAL_TABLET | Freq: Every day | ORAL | Status: DC
  Administered 2020-12-30: 10 mg via ORAL
  Filled 2020-12-30: qty 1

## 2020-12-30 MED ORDER — LOSARTAN POTASSIUM 25 MG PO TABS: 12.5000 mg | ORAL_TABLET | Freq: Every day | ORAL | 1 refills | Status: DC

## 2020-12-30 NOTE — Progress Notes (Addendum)
Progress Note  Patient Name: Robert Church Date of Encounter: 12/30/2020  [facility] Cardiologist: Quay Burow, MD   Subjective   Breathing back to baseline.  Wants to go home.  Now has CPAP.  Inpatient Medications    Scheduled Meds: . allopurinol  300 mg Oral Daily  . chlorhexidine  15 mL Mouth Rinse BID  . Chlorhexidine Gluconate Cloth  6 each Topical Daily  . enoxaparin (LOVENOX) injection  50 mg Subcutaneous Q24H  . ezetimibe  10 mg Oral Daily  . insulin aspart  0-9 Units Subcutaneous TID WC  . losartan  25 mg Oral Daily  . mouth rinse  15 mL Mouth Rinse q12n4p  . mometasone-formoterol  2 puff Inhalation BID  . montelukast  10 mg Oral QHS  . nicotine  21 mg Transdermal Daily  . pantoprazole  40 mg Oral BID  . polyethylene glycol  17 g Oral Daily  . predniSONE  10 mg Oral Q breakfast  . roflumilast  500 mcg Oral Daily  . spironolactone  12.5 mg Oral Daily  . umeclidinium bromide  1 puff Inhalation Daily   Continuous Infusions:  PRN Meds: acetaminophen **OR** acetaminophen, albuterol, alum & mag hydroxide-simeth, LORazepam, nabumetone, ondansetron **OR** ondansetron (ZOFRAN) IV, traMADol   Vital Signs    Vitals:   12/29/20 1923 12/29/20 2146 12/30/20 0535 12/30/20 0958  BP:  123/74 106/86 91/63  Pulse:  89 85   Resp:  18 18   Temp:  98.4 F (36.9 C) 98.1 F (36.7 C)   TempSrc:  Oral Oral   SpO2: 92% 97% 98% 97%  Weight:   95.6 kg   Height:        Intake/Output Summary (Last 24 hours) at 12/30/2020 1017 Last data filed at 12/30/2020 0210 Gross per 24 hour  Intake 240 ml  Output 1375 ml  Net -1135 ml   Last 3 Weights 12/30/2020 12/29/2020 12/28/2020  Weight (lbs) 210 lb 10.9 oz 210 lb 11.2 oz 211 lb 12.8 oz  Weight (kg) 95.565 kg 95.573 kg 96.072 kg      Telemetry    Sinus rhythm- Personally Reviewed  ECG     Physical Exam   GEN: No acute distress.   Neck: No JVD Cardiac: RRR, no murmurs, rubs, or gallops.  Respiratory: Clear to  auscultation bilaterally. GI: Soft, nontender, non-distended  MS: No edema; No deformity. Neuro:  Nonfocal  Psych: Normal affect   Labs    High Sensitivity Troponin:   Recent Labs  Lab 12/17/20 1134 12/17/20 1305  TROPONINIHS 26* 27*      Chemistry Recent Labs  Lab 12/25/20 0613 12/26/20 1008 12/28/20 1417 12/30/20 0454  NA 137 136 136 136  K 3.7 4.0 4.4 4.5  CL 81* 83* 88* 96*  CO2 43* 40* 40* 32  GLUCOSE 112* 244* 124* 147*  BUN 39* 47* 37* 27*  CREATININE 1.14 0.97 0.97 0.87  CALCIUM 8.5* 8.7* 9.4 8.8*  PROT 6.0*  --  6.4* 5.8*  ALBUMIN 3.5  --  3.6 3.3*  AST 40  --  64* 44*  ALT 40  --  78* 68*  ALKPHOS 44  --  55 50  BILITOT 1.7*  --  1.1 0.8  GFRNONAA >60 >60 >60 >60  ANIONGAP 13 13 8 8      Hematology Recent Labs  Lab 12/25/20 0613 12/28/20 1417  WBC 8.7 11.4*  RBC 5.39 5.34  HGB 17.7* 17.4*  HCT 55.6* 53.9*  MCV 103.2* 100.9*  MCH 32.8 32.6  MCHC 31.8 32.3  RDW 12.4 12.4  PLT 150 211    Radiology    No results found.  Cardiac Studies   Echo 12/18/20 1. Left ventricular ejection fraction, by estimation, is 40 to 45%. The  left ventricle has mildly decreased function. The left ventricle  demonstrates global hypokinesis. The left ventricular internal cavity size  was moderately dilated. There is mild  left ventricular hypertrophy. Left ventricular diastolic parameters are  consistent with Grade II diastolic dysfunction (pseudonormalization).  Elevated left atrial pressure.  2. Right ventricular systolic function is mildly reduced. The right  ventricular size is severely enlarged. There is moderately elevated  pulmonary artery systolic pressure. The estimated right ventricular  systolic pressure is 123456 mmHg.  3. Left atrial size was mildly dilated.  4. Right atrial size was mildly dilated.  5. The mitral valve is normal in structure. No evidence of mitral valve  regurgitation.  6. The aortic valve was not well visualized. Aortic  valve regurgitation  is not visualized. No aortic stenosis is present.  7. There is mild dilatation of the ascending aorta, measuring 37 mm.  8. The inferior vena cava is dilated in size with <50% respiratory  variability, suggesting right atrial pressure of 15 mmHg.   Patient Profile     65 y.o. male with PMH ofchronic systolic and diastolic heart failure, nonischemic cardiomyopathy, COPD, tobacco abuse, alcohol abuse, OSA noncompliant with CPAP, chronic venous insufficiency seen for CHF.   Patient presented with significant LE edema and SOB. Followed by Dr. Haroldine Laws outpatient for NICM. BNP 1379 and CXR with pulmonary vascular congestion without edema or consolidation.  Assessment & Plan    1. Acute on chronic combined CHF - Echo this admission with EF 40-45%, global hypokinesis, and G2DD. - Treated with IV lasix 80mg   which transition to p.o. torsemide.  Cardiology signed off 1/6 with recommendation.  However his torsemide gradually reduce due to transient hypotension. -Patient has diuresed almost 25 L since admit.  Weight down 262>> 210lb.  -He appears euvolemic on exam. ?  Dry.  - Renal function normal - No BB given COPD and has not tolerated Bisoprolol in past - Entresto not covered by insurance. Review assistance as outpatient. -Most recent blood pressure 106/86 early morning>>>> reduce losartan to 25 mg daily.  Continue spironolactone 12.5 mg daily.  2. OSA  3. 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 COPD - Pt has Trilogy vent, discussed importance - No active wheezing   5. HTN - as above  6.  Tobacco abuse - Recommended cessation   Has follow up with Dr. Gwenlyn Found 01/03/2021 and heart failure clinic 01/07/21.    For questions or updates, please contact [place] Please consult [email or web address] for contact info under        Signed, Leanor Kail, PA  12/30/2020, 10:17 AM    History and all data above reviewed.  Patient examined.  I agree with the  findings as above.  He is anxious to go home.  The patient exam reveals COR:RRR  ,  Lungs: Clear  ,  Abd: Positive bowel sounds, no rebound no guarding, Ext No edema, dependent rubor  .  All available labs, radiology testing, previous records reviewed. Agree with documented assessment and plan.   Acute on chronic systolic and diastolic HF:  With aggressive diuresis hs BP has been low.   Acute on chronic systolic HF:  Now with hypotension post diuresis.  I think we would send him home with  Cozaar 12.5 and Torsemide 10 mg daily.  He and I talked at length about weights, salt and fluid restriction.   He can be on the spiro as well.     Jeneen Rinks Daena Alper  12:01 PM  12/30/2020

## 2020-12-30 NOTE — Progress Notes (Signed)
SATURATION QUALIFICATIONS: (This note is used to comply with regulatory documentation for home oxygen)  Patient Saturations on Room Air at Rest = 94%  Patient Saturations on Room Air while Ambulating = 92 %  Patient Saturations on 6 Liters of oxygen while Ambulating = 97%  Please briefly explain why patient needs home oxygen: to maintain necessary oxygen saturation

## 2020-12-30 NOTE — Plan of Care (Signed)
  Problem: Education: Goal: Knowledge of General Education information will improve Description: Including pain rating scale, medication(s)/side effects and non-pharmacologic comfort measures Outcome: Completed/Met   Problem: Health Behavior/Discharge Planning: Goal: Ability to manage health-related needs will improve Outcome: Completed/Met   Problem: Clinical Measurements: Goal: Ability to maintain clinical measurements within normal limits will improve Outcome: Completed/Met Goal: Will remain free from infection Outcome: Completed/Met Goal: Diagnostic test results will improve Outcome: Completed/Met Goal: Respiratory complications will improve Outcome: Completed/Met Goal: Cardiovascular complication will be avoided Outcome: Completed/Met   Problem: Activity: Goal: Risk for activity intolerance will decrease Outcome: Completed/Met   Problem: Nutrition: Goal: Adequate nutrition will be maintained Outcome: Completed/Met   Problem: Coping: Goal: Level of anxiety will decrease Outcome: Completed/Met   Problem: Elimination: Goal: Will not experience complications related to bowel motility Outcome: Completed/Met Goal: Will not experience complications related to urinary retention Outcome: Completed/Met   Problem: Pain Managment: Goal: General experience of comfort will improve Outcome: Completed/Met   Problem: Safety: Goal: Ability to remain free from injury will improve Outcome: Completed/Met   Problem: Skin Integrity: Goal: Risk for impaired skin integrity will decrease Outcome: Completed/Met   Problem: Education: Goal: Knowledge of disease or condition will improve Outcome: Completed/Met Goal: Knowledge of the prescribed therapeutic regimen will improve Outcome: Completed/Met Goal: Individualized Educational Video(s) Outcome: Completed/Met   Problem: Activity: Goal: Ability to tolerate increased activity will improve Outcome: Completed/Met Goal: Will  verbalize the importance of balancing activity with adequate rest periods Outcome: Completed/Met   Problem: Respiratory: Goal: Ability to maintain a clear airway will improve Outcome: Completed/Met Goal: Levels of oxygenation will improve Outcome: Completed/Met Goal: Ability to maintain adequate ventilation will improve Outcome: Completed/Met

## 2020-12-30 NOTE — Progress Notes (Signed)
Pt alert and aware sitting on edge of bed. Pt's mother was also present. She had spoken to chaplain Genesis in the hallway and that chaplain referred the visit to me. The mother talked about her faith and how she has lived to be 64.  The chaplain offered caring and supportive presence, prayers and blessings along with sacred music. Further visits will be offered.

## 2020-12-30 NOTE — TOC Transition Note (Signed)
Transition of Care [facility]) - CM/SW Discharge Note   Patient Details  Name: Robert Church MRN: [id number] Date of Birth: Jun 20, 1956  Transition of Care [facility]) CM/SW Contact:  Ross Ludwig, LCSW Phone Number: 12/30/2020, 2:42 PM   Clinical Narrative:     Patient will be going home with Trilogy NIV and oxygen.  CSW signing off please reconsult with any other social work needs, DME agency [employer] has been notified of planned discharge.   Final next level of care: Home/Self Care Barriers to Discharge: Barriers Resolved   Patient Goals and CMS Choice Patient states their goals for this hospitalization and ongoing recovery are:: To return back home with Trilogy and oxygen. CMS Medicare.gov Compare Post Acute Care list provided to:: Patient Choice offered to / list presented to : Patient  Discharge Placement                       Discharge Plan and Services   Discharge Planning Services: CM Consult            DME Arranged: Oxygen,NIV DME Agency: [employer] Date DME Agency Contacted: 12/27/20 Time DME Agency Contacted: [phone] Representative spoke with at DME Agency: [place]: NIV          Social Determinants of Health (SDOH) Interventions     Readmission Risk Interventions No flowsheet data found.

## 2020-12-31 ENCOUNTER — Telehealth: Payer: Self-pay | Admitting: Cardiovascular Disease

## 2020-12-31 NOTE — Telephone Encounter (Signed)
He has nl cors so doesn't require NTG

## 2020-12-31 NOTE — Telephone Encounter (Signed)
Patient is requesting to review his medication list and instructions on how to take them. He states after recently being discharged from the hospital he was advised to make adjustments and he would like to ensure that these adjustments will not interfere with any of his current medications. He states he will discuss further when he receives a call back. Please clarify.

## 2020-12-31 NOTE — Telephone Encounter (Signed)
°*  STAT* If patient is at the pharmacy, call can be transferred to refill team.   1. Which medications need to be refilled? (please list name of each medication and dose if known)  nitroGLYCERIN (NITROSTAT) SL tablet 0.8 mg   2. Which pharmacy/location (including street and city if local pharmacy) is medication to be sent to? [facility], [place] - [address] AT [place]  3. Do they need a 30 day or 90 day supply? 30    Patient said after he got home from the hospital he did not have any medication left

## 2020-12-31 NOTE — Telephone Encounter (Signed)
Attempted to contact pt. Unable to leave message as mailbox is full.  °

## 2020-12-31 NOTE — Telephone Encounter (Signed)
Patient returning a call from our office to discuss medications. Please call back

## 2020-12-31 NOTE — Telephone Encounter (Signed)
Spoke with pt. He report being confused about recent medication changes and wanted a nurse to go over them with him. Pt informed of  Discharge instructions and report accidentally taking a whole tablet of losartan and torsemide this morning. Pt advised to monitor BP.   Pt also requesting a new prescription for nitroglycerin but it's no longer listed on current medication. Will route to Dr. Gwenlyn Found for clarifications.

## 2021-01-01 ENCOUNTER — Telehealth: Payer: Self-pay | Admitting: Emergency Medicine

## 2021-01-01 NOTE — Telephone Encounter (Signed)
Found application in RB's cubby up front. It looks like RB filled out a form for a handicap placard back in 2020.   Will place in RB's folder.   RB, please advise if you are willing to sign the handicap plate form. Thanks!

## 2021-01-01 NOTE — Telephone Encounter (Signed)
Pt updated and verbalized understanding.  

## 2021-01-01 NOTE — Telephone Encounter (Signed)
Signed it today 

## 2021-01-01 NOTE — Telephone Encounter (Signed)
ATC pt. [name] signed paperwork and it was placed behind check in desk for pt pickup. Nothing further needed at this time.

## 2021-01-02 ENCOUNTER — Telehealth: Payer: Self-pay

## 2021-01-02 NOTE — Telephone Encounter (Signed)
Attempted to call pt to move appointment time up for tomorrow (01/03/21). Pt is scheduled at 3:30pm but we have an open slot for 3:00pm if he can come in early.

## 2021-01-03 ENCOUNTER — Encounter: Payer: Self-pay | Admitting: Cardiovascular Disease

## 2021-01-03 ENCOUNTER — Other Ambulatory Visit: Payer: Self-pay

## 2021-01-03 ENCOUNTER — Ambulatory Visit (INDEPENDENT_AMBULATORY_CARE_PROVIDER_SITE_OTHER): Payer: 59 | Admitting: Cardiovascular Disease

## 2021-01-03 VITALS — BP 92/70 | HR 93 | Ht 70.0 in | Wt 216.0 lb

## 2021-01-03 DIAGNOSIS — G4733 Obstructive sleep apnea (adult) (pediatric): Secondary | ICD-10-CM | POA: Diagnosis not present

## 2021-01-03 DIAGNOSIS — F17210 Nicotine dependence, cigarettes, uncomplicated: Secondary | ICD-10-CM

## 2021-01-03 DIAGNOSIS — I10 Essential (primary) hypertension: Secondary | ICD-10-CM

## 2021-01-03 DIAGNOSIS — I428 Other cardiomyopathies: Secondary | ICD-10-CM | POA: Diagnosis not present

## 2021-01-03 NOTE — Assessment & Plan Note (Signed)
Long history of tobacco use having quit during his recent hospitalization.

## 2021-01-03 NOTE — Progress Notes (Signed)
01/03/2021 Robert Church   10-23-1956  [id number]  Primary Physician Janith Lima, MD Primary Cardiologist: [name] MD Lupe Carney, [place]  HPI:  Robert Church is a 65 y.o.   severely overweight divorced Caucasian male father of 1 grandchild who works in [employer]. He was referred because of chest pain by Dr. Lisbeth Ply forcardiovascularevaluation.I last saw him in the office 4/30/2021his mother is at [facility] my long-term patients as well. He saw Dr. Johnsie Cancel in the past. His risk factors include ongoing tobacco abuse, hypertension and family history.Henever had a heart attack or stroke. He did have a heart catheterization performed by Dr. Tamala Julian on 04/19/2017 that showed normal coronary arteries with an EF of 40% consistent with a nonischemic cardia myopathy.A 2D echo performed 07/07/2018 showed an EF of 30 to 35%.  I referred him to Dr. Haroldine Laws in the advanced heart failure clinic who changed his carvedilol to Zebeta and his losartan to [facility]. Unfortunately, he did not tolerate the Entresto and he was changed back to losartan.Hedoes have obstructive sleep apnea on CPAP which he wears intermittently. He continues to exhibit class II/III symptoms.  Since I saw him8 months ago he did well until 01/18/2020 when he was admitted with volume overload.  His 2D echo was unchanged with an EF of 40 to 45%.  He was diving 65 pounds from approximately 275 down to 210 and discharged home on torsemide and spironolactone.  He is aware of salt restriction.  He did not stop smoking during his hospitalization.  He weighs himself on a daily basis.    Current Meds  Medication Sig  . albuterol (PROVENTIL) (2.5 MG/3ML) 0.083% nebulizer solution Take 3 mLs (2.5 mg total) by nebulization every 6 (six) hours as needed for wheezing or shortness of breath.  Marland Kitchen albuterol (VENTOLIN HFA) 108 (90 Base) MCG/ACT inhaler Inhale 2 puffs into the lungs  every 6 (six) hours as needed for wheezing.  Marland Kitchen allopurinol (ZYLOPRIM) 300 MG tablet Take 1 tablet (300 mg total) by mouth daily.  . Azelastine-Fluticasone 137-50 MCG/ACT SUSP 1 spray each nostril twice daily (Patient taking differently: Place 1 spray into both nostrils daily as needed (allergy).)  . Budeson-Glycopyrrol-Formoterol (BREZTRI AEROSPHERE) 160-9-4.8 MCG/ACT AERO Inhale 2 puffs into the lungs in the morning and at bedtime.  . cholecalciferol (VITAMIN D) 1000 units tablet Take 1,000 Units by mouth daily.  Marland Kitchen EPINEPHrine 0.3 mg/0.3 mL IJ SOAJ injection Inject 0.3 mLs (0.3 mg total) into the muscle as needed for anaphylaxis.  Marland Kitchen ezetimibe (ZETIA) 10 MG tablet Take 1 tablet (10 mg total) by mouth daily.  . Insulin Pen Needle (NOVOFINE) 32G X 6 MM MISC 1 Act by Does not apply route once a week.  . losartan (COZAAR) 25 MG tablet Take 0.5 tablets (12.5 mg total) by mouth daily.  . metFORMIN (GLUCOPHAGE XR) 750 MG 24 hr tablet Take 1 tablet (750 mg total) by mouth daily with breakfast.  . montelukast (SINGULAIR) 10 MG tablet Take 1 tablet (10 mg total) by mouth at bedtime.  . nabumetone (RELAFEN) 500 MG tablet Take 1 tablet (500 mg total) by mouth 2 (two) times daily as needed. (Patient taking differently: Take 500 mg by mouth 2 (two) times daily as needed for moderate pain.)  . nicotine (NICODERM CQ - DOSED IN MG/24 HOURS) 21 mg/24hr patch Place 1 patch (21 mg total) onto the skin daily.  Marland Kitchen omeprazole (PRILOSEC) 20 MG capsule Take 1 capsule (20 mg  total) by mouth 2 (two) times daily before a meal. (Patient taking differently: Take 20 mg by mouth daily.)  . polyethylene glycol (MIRALAX / GLYCOLAX) 17 g packet Take 17 g by mouth daily as needed.  . predniSONE (DELTASONE) 10 MG tablet Take 1 tablet (10 mg total) by mouth daily with breakfast.  . roflumilast (DALIRESP) 500 MCG TABS tablet Take 1 tablet (500 mcg total) by mouth daily.  Marland Kitchen spironolactone (ALDACTONE) 25 MG tablet Take 0.5 tablets (12.5 mg  total) by mouth daily.  . Tiotropium Bromide Monohydrate (SPIRIVA RESPIMAT) 2.5 MCG/ACT AERS Inhale 2.5 mcg into the lungs 2 (two) times daily.  Marland Kitchen torsemide (DEMADEX) 10 MG tablet Take 1 tablet (10 mg total) by mouth daily.  . traMADol (ULTRAM) 50 MG tablet Take 50 mg by mouth every 6 (six) hours as needed for severe pain.     Allergies  Allergen Reactions  . Clindamycin Other (See Comments)    Tongue swelling  . Doxycycline     Severe stomach upset per patient  . E-Mycin [Erythromycin Base] Swelling  . Penicillins Swelling and Other (See Comments)    Childhood rxn--MD stated he "almost died" Has patient had a PCN reaction causing immediate rash, facial/tongue/throat swelling, SOB or lightheadedness with hypotension:Yes Has patient had a PCN reaction causing severe rash involving mucus membranes or skin necrosis:unsure Has patient had a PCN reaction that required hospitalization:unsure Has patient had a PCN reaction occurring within the last 10 years:No If all of the above answers are "NO", then may proceed with Cephalosporin use.    . Rosuvastatin     Other reaction(s): Myalgias (intolerance)    Social History   Socioeconomic History  . Marital status: Single    Spouse name: Not on file  . Number of children: Not on file  . Years of education: Not on file  . Highest education level: Not on file  Occupational History  . Occupation: Dealer  Tobacco Use  . Smoking status: Current Every Day Smoker    Packs/day: 0.25    Years: 46.00    Pack years: 11.50    Types: Cigarettes  . Smokeless tobacco: Never Used  . Tobacco comment: 3 cigarettes smoked daily  ARJ 12/05/20  Vaping Use  . Vaping Use: Never used  Substance and Sexual Activity  . Alcohol use: Yes    Alcohol/week: 0.0 standard drinks  . Drug use: No  . Sexual activity: Yes    Birth control/protection: None  Other Topics Concern  . Not on file  Social History Narrative  . Not on file   Social Determinants of  Health   Financial Resource Strain: Not on file  Food Insecurity: Not on file  Transportation Needs: Not on file  Physical Activity: Not on file  Stress: Not on file  Social Connections: Not on file  Intimate Partner Violence: Not on file     Review of Systems: General: negative for chills, fever, night sweats or weight changes.  Cardiovascular: negative for chest pain, dyspnea on exertion, edema, orthopnea, palpitations, paroxysmal nocturnal dyspnea or shortness of breath Dermatological: negative for rash Respiratory: negative for cough or wheezing Urologic: negative for hematuria Abdominal: negative for nausea, vomiting, diarrhea, bright red blood per rectum, melena, or hematemesis Neurologic: negative for visual changes, syncope, or dizziness All other systems reviewed and are otherwise negative except as noted above.    Blood pressure 92/70, pulse 93, height 5\' 10"  (1.778 m), weight 216 lb (98 kg).  General appearance: alert and no  distress Neck: no adenopathy, no carotid bruit, no JVD, supple, symmetrical, trachea midline and thyroid not enlarged, symmetric, no tenderness/mass/nodules Lungs: clear to auscultation bilaterally Heart: regular rate and rhythm, S1, S2 normal, no murmur, click, rub or gallop Extremities: extremities normal, atraumatic, no cyanosis or edema Pulses: 2+ and symmetric Skin: Skin color, texture, turgor normal. No rashes or lesions Neurologic: Alert and oriented X 3, normal strength and tone. Normal symmetric reflexes. 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  I personally reviewed this EKG.  ASSESSMENT AND PLAN:   HTN (hypertension) History of essential hypertension only on 12.5 mg of losartan with blood pressure measured today at 92/70.  He does feel somewhat lightheaded when he stands up.  I suspect this is from being mildly dry from his torsemide and  spironolactone.  Obstructive sleep apnea syndrome History of obstructive sleep apnea on BiPAP at home.  Cigarette smoker Long history of tobacco use having quit during his recent hospitalization.  NICM (nonischemic cardiomyopathy) ([place]) History of nonischemic cardiomyopathy with recent echo performed 11/30/2019 revealing an EF of 40 to 45% with an elevated right ventricular systolic pressure of 50 mmHg.  He did have a heart catheterization performed by Dr. Tamala Julian 04/19/2017 revealing normal coronary arteries and EF of 40% at that time.  He was recently admitted from 12/17/2020 through 12/30/2020 for volume overload and was diuresed 65 pounds.  He was discharged home with a dry weight of 210 on torsemide and spironolactone.  He is aware of salt restriction.      [name] MD FACP,FACC,FAHA, [facility] 01/03/2021 3:54 PM

## 2021-01-03 NOTE — Assessment & Plan Note (Signed)
History of obstructive sleep apnea on BiPAP at home.

## 2021-01-03 NOTE — Patient Instructions (Signed)
Medication Instructions:  Your physician recommends that you continue on your current medications as directed. Please refer to the Current Medication list given to you today.  *If you need a refill on your cardiac medications before your next appointment, please call your pharmacy*   Follow-Up: At [facility], you and your health needs are our priority.  As part of our continuing mission to provide you with exceptional heart care, we have created designated Provider Care Teams.  These Care Teams include your primary Cardiologist (physician) and Advanced Practice Providers (APPs -  Physician Assistants and Nurse Practitioners) who all work together to provide you with the care you need, when you need it.  We recommend signing up for the patient portal called "MyChart".  Sign up information is provided on this After Visit Summary.  MyChart is used to connect with patients for Virtual Visits (Telemedicine).  Patients are able to view lab/test results, encounter notes, upcoming appointments, etc.  Non-urgent messages can be sent to your provider as well.   To learn more about what you can do with MyChart, go to [email or web address].    Your next appointment:   3 month(s)  The format for your next appointment:   In Person  Provider:   You will see one of the following Advanced Practice Providers on your designated Care Team:    Luke Kilroy, PA-C  Callie Goodrich, PA-C  Jesse Cleaver, FNP  Then, Jonathan Berry, MD will plan to see you again in 6 month(s).  

## 2021-01-03 NOTE — Assessment & Plan Note (Signed)
History of essential hypertension only on 12.5 mg of losartan with blood pressure measured today at 92/70.  He does feel somewhat lightheaded when he stands up.  I suspect this is from being mildly dry from his torsemide and spironolactone.

## 2021-01-03 NOTE — Assessment & Plan Note (Signed)
History of nonischemic cardiomyopathy with recent echo performed 11/30/2019 revealing an EF of 40 to 45% with an elevated right ventricular systolic pressure of 50 mmHg.  He did have a heart catheterization performed by Dr. Tamala Julian 04/19/2017 revealing normal coronary arteries and EF of 40% at that time.  He was recently admitted from 12/17/2020 through 12/30/2020 for volume overload and was diuresed 65 pounds.  He was discharged home with a dry weight of 210 on torsemide and spironolactone.  He is aware of salt restriction.

## 2021-01-06 ENCOUNTER — Inpatient Hospital Stay: Payer: 59 | Admitting: Internal Medicine

## 2021-01-07 ENCOUNTER — Encounter (HOSPITAL_COMMUNITY): Payer: Self-pay

## 2021-01-07 ENCOUNTER — Other Ambulatory Visit: Payer: Self-pay

## 2021-01-07 ENCOUNTER — Ambulatory Visit (HOSPITAL_COMMUNITY)
Admit: 2021-01-07 | Discharge: 2021-01-07 | Disposition: A | Discharge status: Home / Self Care | Payer: 59 | Attending: Cardiology | Admitting: Cardiology

## 2021-01-07 VITALS — BP 118/80 | HR 75 | Wt 220.0 lb

## 2021-01-07 DIAGNOSIS — Z7952 Long term (current) use of systemic steroids: Secondary | ICD-10-CM | POA: Insufficient documentation

## 2021-01-07 DIAGNOSIS — Z7951 Long term (current) use of inhaled steroids: Secondary | ICD-10-CM | POA: Diagnosis not present

## 2021-01-07 DIAGNOSIS — Z888 Allergy status to other drugs, medicaments and biological substances status: Secondary | ICD-10-CM | POA: Insufficient documentation

## 2021-01-07 DIAGNOSIS — Z7984 Long term (current) use of oral hypoglycemic drugs: Secondary | ICD-10-CM | POA: Diagnosis not present

## 2021-01-07 DIAGNOSIS — I453 Trifascicular block: Secondary | ICD-10-CM | POA: Insufficient documentation

## 2021-01-07 DIAGNOSIS — I110 Hypertensive heart disease with heart failure: Secondary | ICD-10-CM | POA: Diagnosis not present

## 2021-01-07 DIAGNOSIS — Z6831 Body mass index (BMI) 31.0-31.9, adult: Secondary | ICD-10-CM | POA: Diagnosis not present

## 2021-01-07 DIAGNOSIS — Z880 Allergy status to penicillin: Secondary | ICD-10-CM | POA: Insufficient documentation

## 2021-01-07 DIAGNOSIS — Z791 Long term (current) use of non-steroidal anti-inflammatories (NSAID): Secondary | ICD-10-CM | POA: Diagnosis not present

## 2021-01-07 DIAGNOSIS — G4733 Obstructive sleep apnea (adult) (pediatric): Secondary | ICD-10-CM | POA: Diagnosis not present

## 2021-01-07 DIAGNOSIS — I5042 Chronic combined systolic (congestive) and diastolic (congestive) heart failure: Secondary | ICD-10-CM

## 2021-01-07 DIAGNOSIS — J449 Chronic obstructive pulmonary disease, unspecified: Secondary | ICD-10-CM | POA: Diagnosis not present

## 2021-01-07 DIAGNOSIS — Z881 Allergy status to other antibiotic agents status: Secondary | ICD-10-CM | POA: Insufficient documentation

## 2021-01-07 DIAGNOSIS — I428 Other cardiomyopathies: Secondary | ICD-10-CM | POA: Diagnosis not present

## 2021-01-07 DIAGNOSIS — I5022 Chronic systolic (congestive) heart failure: Secondary | ICD-10-CM | POA: Diagnosis present

## 2021-01-07 DIAGNOSIS — Z79899 Other long term (current) drug therapy: Secondary | ICD-10-CM | POA: Insufficient documentation

## 2021-01-07 LAB — BASIC METABOLIC PANEL
Anion gap: 10 (ref 5–15)
BUN: 14 mg/dL (ref 8–23)
CO2: 26 mmol/L (ref 22–32)
Calcium: 9.1 mg/dL (ref 8.9–10.3)
Chloride: 99 mmol/L (ref 98–111)
Creatinine, Ser: 0.77 mg/dL (ref 0.61–1.24)
GFR, Estimated: >60 mL/min (ref >60)
Glucose, Bld: 88 mg/dL (ref 70–99)
Potassium: 4.7 mmol/L (ref 3.5–5.1)
Sodium: 135 mmol/L (ref 135–145)

## 2021-01-07 NOTE — Progress Notes (Signed)
ADVANCED HF CLINIC NOTE  MD: Dr. Haroldine Laws  Reason for Visit: [facility] F/u for Systolic Heart Failure  HPI:  Robert Church is a 65 y/o male with h/o obesity, HTN, COPD with ongoing tobacco use and chronic systolic HF referred by Dr. Gwenlyn Found for further management of his HF.   He underwent cath in 4/18 due to CP. Cath showed normal coronaries with EF 40% and global HK. LVEDP 19. He was referred for a sleep study.  He saw Dr. Gwenlyn Found on 07/01/2018 because of recurrent chest pain.  Myoview stress test performed 07/21/2018 showed EF 38% inferior scar with septal ischemia.  A 2D echo performed 07/07/2018 revealed an EF of 30 to 35%.  He was referred for cardiac CT.   Cardiac CT on 08/26/18.  Calcium score 148 (74th percentile)  LAD 25% otherwise normal cors. Dilated pulmonary arteries suggestive of PH.   Works for [employer]. Follows with [name] in Pulmonary Clinic. Was started on bisoprolol 2.5 but couldn't tolerate due to fatigue. Was falling asleep at work. Stopped bisoprolol and felt better.   Echo 12/20 EF improved to 45%. Mild RV dysfunction   Recently admitted 1/22 for acute on chronic hypoxic respiratory failure secondary to acute COPDE and a/c CHF w/ volume overload. He had massive diuresis w/ IV Lasix, diuresed down from admit wt of 265 lb to 210 lb. Echo was repeated 12/21, EF 40-45% (unchanged). RV mildly reduced, right ventricular systolic pressure was 19.4 mmHg  He presents to clinic today for f/u. Feels well. Reports wt gain of 3 lb since discharge, based on home scale. In clinic today, his wt up 9 lb from recent discharge wt but he attributes to clothing. He is NYHA Class III (chronic). Does ok walking on flat surfaces but struggles w/ inclines. Denies resting dyspnea. No LEE, orthopnea/ PND. Reports improved compliance w/ CPAP. Sleeping w/ nightly. No longer smoking. Reports full med compliance. BP and HR well controlled.  BP 118/80 prior to AM meds. ReDs Clip 33%.      Past Medical History:  Diagnosis Date  . Allergy   . Asthma   . Bronchitis   . CHF (congestive heart failure) ([place])   . COPD (chronic obstructive pulmonary disease) ([place])   . GERD (gastroesophageal reflux disease)   . Hypertension   . Pneumonia   . Sinusitis   . SOB (shortness of breath)     Current Outpatient Medications  Medication Sig Dispense Refill  . albuterol (PROVENTIL) (2.5 MG/3ML) 0.083% nebulizer solution Take 3 mLs (2.5 mg total) by nebulization every 6 (six) hours as needed for wheezing or shortness of breath. 75 mL 12  . albuterol (VENTOLIN HFA) 108 (90 Base) MCG/ACT inhaler Inhale 2 puffs into the lungs every 6 (six) hours as needed for wheezing. 18 g 2  . allopurinol (ZYLOPRIM) 300 MG tablet Take 1 tablet (300 mg total) by mouth daily. 90 tablet 1  . Azelastine-Fluticasone 137-50 MCG/ACT SUSP 1 spray each nostril twice daily 23 g 6  . Budeson-Glycopyrrol-Formoterol (BREZTRI AEROSPHERE) 160-9-4.8 MCG/ACT AERO Inhale 2 puffs into the lungs in the morning and at bedtime. 5.9 g 2  . cholecalciferol (VITAMIN D) 1000 units tablet Take 1,000 Units by mouth daily.    Marland Kitchen losartan (COZAAR) 25 MG tablet Take 0.5 tablets (12.5 mg total) by mouth daily. 45 tablet 1  . metFORMIN (GLUCOPHAGE XR) 750 MG 24 hr tablet Take 1 tablet (750 mg total) by mouth daily with breakfast. 90 tablet 1  .  montelukast (SINGULAIR) 10 MG tablet Take 1 tablet (10 mg total) by mouth at bedtime. 30 tablet 2  . nabumetone (RELAFEN) 500 MG tablet Take 1 tablet (500 mg total) by mouth 2 (two) times daily as needed. (Patient taking differently: Take 500 mg by mouth 2 (two) times daily as needed for moderate pain.) 180 tablet 1  . nicotine (NICODERM CQ - DOSED IN MG/24 HOURS) 21 mg/24hr patch Place 1 patch (21 mg total) onto the skin daily. 28 patch 0  . omeprazole (PRILOSEC) 20 MG capsule Take 1 capsule (20 mg total) by mouth 2 (two) times daily before a meal. (Patient taking differently: Take 20 mg by mouth  daily.) 180 capsule 1  . polyethylene glycol (MIRALAX / GLYCOLAX) 17 g packet Take 17 g by mouth daily as needed. 14 each 0  . predniSONE (DELTASONE) 10 MG tablet Take 1 tablet (10 mg total) by mouth daily with breakfast. 30 tablet 2  . roflumilast (DALIRESP) 500 MCG TABS tablet Take 1 tablet (500 mcg total) by mouth daily. 30 tablet 3  . spironolactone (ALDACTONE) 25 MG tablet Take 0.5 tablets (12.5 mg total) by mouth daily. 60 tablet 2  . Tiotropium Bromide Monohydrate (SPIRIVA RESPIMAT) 2.5 MCG/ACT AERS Inhale 2.5 mcg into the lungs 2 (two) times daily. 4 g 11  . torsemide (DEMADEX) 10 MG tablet Take 1 tablet (10 mg total) by mouth daily. 45 tablet 1   No current facility-administered medications for this encounter.    Allergies  Allergen Reactions  . Clindamycin Other (See Comments)    Tongue swelling  . Doxycycline     Severe stomach upset per patient  . E-Mycin [Erythromycin Base] Swelling  . Penicillins Swelling and Other (See Comments)    Childhood rxn--MD stated he "almost died" Has patient had a PCN reaction causing immediate rash, facial/tongue/throat swelling, SOB or lightheadedness with hypotension:Yes Has patient had a PCN reaction causing severe rash involving mucus membranes or skin necrosis:unsure Has patient had a PCN reaction that required hospitalization:unsure Has patient had a PCN reaction occurring within the last 10 years:No If all of the above answers are "NO", then may proceed with Cephalosporin use.    . Rosuvastatin     Other reaction(s): Myalgias (intolerance)      Social History   Socioeconomic History  . Marital status: Single    Spouse name: Not on file  . Number of children: Not on file  . Years of education: Not on file  . Highest education level: Not on file  Occupational History  . Occupation: Dealer  Tobacco Use  . Smoking status: Current Every Day Smoker    Packs/day: 0.25    Years: 46.00    Pack years: 11.50    Types: Cigarettes   . Smokeless tobacco: Never Used  . Tobacco comment: 3 cigarettes smoked daily  ARJ 12/05/20  Vaping Use  . Vaping Use: Never used  Substance and Sexual Activity  . Alcohol use: Yes    Alcohol/week: 0.0 standard drinks  . Drug use: No  . Sexual activity: Yes    Birth control/protection: None  Other Topics Concern  . Not on file  Social History Narrative  . Not on file   Social Determinants of Health   Financial Resource Strain: Not on file  Food Insecurity: Not on file  Transportation Needs: Not on file  Physical Activity: Not on file  Stress: Not on file  Social Connections: Not on file  Intimate Partner Violence: Not on file  Family History  Problem Relation Age of Onset  . Lung cancer Father   . Brain cancer Father     Vitals:   01/07/21 1156  BP: 118/80  Pulse: 75  SpO2: 91%  Weight: 99.8 kg    PHYSICAL EXAM: General:  Well appearing, moderately obese. No respiratory difficulty HEENT: normal Neck: supple. no JVD. Carotids 2+ bilat; no bruits. No lymphadenopathy or thyromegaly appreciated. Cor: PMI nondisplaced. Regular rate & rhythm. No rubs, gallops or murmurs. Lungs: bilateral expiratory rhonchi and mild wheezing, no crackles.  Abdomen: soft, nontender, nondistended. No hepatosplenomegaly. No bruits or masses. Good bowel sounds. Extremities: no cyanosis, clubbing, rash, edema Neuro: alert & oriented x 3, cranial nerves grossly intact. moves all 4 extremities w/o difficulty. Affect pleasant.   ASSESSMENT & PLAN:  1. Chronic systolic HF due to NICM  - cath 4/18 normal cors. EF 48%  - Myoview 07/21/2018 showed EF 38% inferior scar with septal ischemia.   - Echo 07/07/2018 revealed EF 30-35%. RV normal.  - Cardiac CT 08/26/18.  Calcium score 148 (74th percentile)  LAD 25% otherwise normal cors. Dilated pulmonary arteries suggestive of PH.  - Suspect NICM due to HTN and inadequately treated OSA (He now reports improved nightly compliance w/ CPAP). - Echo  12/20 EF 45% mild RV dysfunction - Recent admit for a/c CHF 1/21, EF unchanged 40-45%. RV mildly reduced. RVSP 49 - Chronically NYHA Class III, confounded by COPD and obesity  - Volume status stable post d/c. Euvolemic on exam and by ReDs clip at 33% - BP 118/80 prior to AM meds, will not titrate regimen today to avoid hypotension  - Continue torsemide 10 mg daily  - Continue Losartan 12.5 mg daily (failed Entresto)  - Continue Spiro 12.5 mg daily  - Could not tolerate bisoprolol due to fatigue.  - Check BMP today.   2. HTN - well controlled on current regimen - check BMP today   3. COPD  -  has quit smoking, congratulated on efforts  -  Follows with Pulmonary ([name])  4. OSA - reports nightly compliance w/ CPAP   5. Morbid obesity  Body mass index is 31.57 kg/m. 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

## 2021-01-07 NOTE — Patient Instructions (Signed)
It was great to see you today! No medication changes are needed at this time.  Labs today We will only contact you if something comes back abnormal or we need to make some changes. Otherwise no news is good news!  Your physician recommends that you schedule a follow-up appointment in: 6-8 weeks in the Advanced Practitioners (PA/NP) Clinic    If you have any questions or concerns before your next appointment please send [place] a message through [place] or call our office at [phone].    TO LEAVE A MESSAGE FOR THE NURSE SELECT OPTION 2, PLEASE LEAVE A MESSAGE INCLUDING: . YOUR NAME . DATE OF BIRTH . CALL BACK NUMBER . REASON FOR CALL**this is important as we prioritize the call backs  YOU WILL RECEIVE A CALL BACK THE SAME DAY AS LONG AS YOU CALL BEFORE 4:00 PM

## 2021-01-09 ENCOUNTER — Encounter: Payer: Self-pay | Admitting: Internal Medicine

## 2021-01-09 ENCOUNTER — Ambulatory Visit (INDEPENDENT_AMBULATORY_CARE_PROVIDER_SITE_OTHER): Payer: 59 | Admitting: Internal Medicine

## 2021-01-09 ENCOUNTER — Other Ambulatory Visit: Payer: Self-pay

## 2021-01-09 ENCOUNTER — Ambulatory Visit (INDEPENDENT_AMBULATORY_CARE_PROVIDER_SITE_OTHER): Payer: 59

## 2021-01-09 VITALS — BP 138/80 | HR 63 | Temp 97.9°F | Resp 16 | Ht 70.0 in | Wt 216.4 lb

## 2021-01-09 DIAGNOSIS — M4802 Spinal stenosis, cervical region: Secondary | ICD-10-CM

## 2021-01-09 DIAGNOSIS — G8929 Other chronic pain: Secondary | ICD-10-CM

## 2021-01-09 DIAGNOSIS — M5030 Other cervical disc degeneration, unspecified cervical region: Secondary | ICD-10-CM | POA: Diagnosis not present

## 2021-01-09 DIAGNOSIS — M545 Low back pain, unspecified: Secondary | ICD-10-CM | POA: Diagnosis not present

## 2021-01-09 DIAGNOSIS — M542 Cervicalgia: Secondary | ICD-10-CM | POA: Insufficient documentation

## 2021-01-09 DIAGNOSIS — E118 Type 2 diabetes mellitus with unspecified complications: Secondary | ICD-10-CM

## 2021-01-09 NOTE — Patient Instructions (Signed)
Type 2 Diabetes Mellitus, Diagnosis, Adult Type 2 diabetes (type 2 diabetes mellitus) is a long-term, or chronic, disease. In type 2 diabetes, one or both of these problems may be present:  The pancreas does not make enough of a hormone called insulin.  Cells in the body do not respond properly to insulin that the body makes (insulin resistance). Normally, insulin allows blood sugar (glucose) to enter cells in the body. The cells use glucose for energy. Insulin resistance or lack of insulin causes excess glucose to build up in the blood instead of going into cells. This causes high blood glucose (hyperglycemia).  What are the causes? The exact cause of type 2 diabetes is not known. What increases the risk? The following factors may make you more likely to develop this condition:  Having a family member with type 2 diabetes.  Being overweight or obese.  Being inactive (sedentary).  Having been diagnosed with insulin resistance.  Having a history of prediabetes, diabetes when you were pregnant (gestational diabetes), or polycystic ovary syndrome (PCOS). What are the signs or symptoms? In the early stage of this condition, you may not have symptoms. Symptoms develop slowly and may include:  Increased thirst or hunger.  Increased urination.  Unexplained weight loss.  Tiredness (fatigue) or weakness.  Vision changes, such as blurry vision.  Dark patches on the skin. How is this diagnosed? This condition is diagnosed based on your symptoms, your medical history, a physical exam, and your blood glucose level. Your blood glucose may be checked with one or more of the following blood tests:  A fasting blood glucose (FBG) test. You will not be allowed to eat (you will fast) for 8 hours or longer before a blood sample is taken.  A random blood glucose test. This test checks blood glucose at any time of day regardless of when you ate.  An A1C (hemoglobin A1C) blood test. This test  provides information about blood glucose levels over the previous 2-3 months.  An oral glucose tolerance test (OGTT). This test measures your blood glucose at two times: ? After fasting. This is your baseline blood glucose level. ? Two hours after drinking a beverage that contains glucose. You may be diagnosed with type 2 diabetes if:  Your fasting blood glucose level is 126 mg/dL (7.0 mmol/L) or higher.  Your random blood glucose level is 200 mg/dL (11.1 mmol/L) or higher.  Your A1C level is 6.5% or higher.  Your oral glucose tolerance test result is higher than 200 mg/dL (11.1 mmol/L). These blood tests may be repeated to confirm your diagnosis.   How is this treated? Your treatment may be managed by a specialist called an endocrinologist. Type 2 diabetes may be treated by following instructions from your health care provider about:  Making dietary and lifestyle changes. These may include: ? Following a personalized nutrition plan that is developed by a registered dietitian. ? Exercising regularly. ? Finding ways to manage stress.  Checking your blood glucose level as often as told.  Taking diabetes medicines or insulin daily. This helps to keep your blood glucose levels in the healthy range.  Taking medicines to help prevent complications from diabetes. Medicines may include: ? Aspirin. ? Medicine to lower cholesterol. ? Medicine to control blood pressure. Your health care provider will set treatment goals for you. Your goals will be based on your age, other medical conditions you have, and how you respond to diabetes treatment. Generally, the goal of treatment is to maintain the   following blood glucose levels:  Before meals: 80-130 mg/dL (4.4-7.2 mmol/L).  After meals: below 180 mg/dL (10 mmol/L).  A1C level: less than 7%. Follow these instructions at home: Questions to ask your health care provider Consider asking the following questions:  Should I meet with a certified  diabetes care and education specialist?  What diabetes medicines do I need, and when should I take them?  What equipment will I need to manage my diabetes at home?  How often do I need to check my blood glucose?  Where can I find a support group for people with diabetes?  What number can I call if I have questions?  When is my next appointment? General instructions  Take over-the-counter and prescription medicines only as told by your health care provider.  Keep all follow-up visits as told by your health care provider. This is important. Where to find more information  American Diabetes Association (ADA): www.diabetes.org  American Association of Diabetes Care and Education Specialists (ADCES): www.diabeteseducator.org  International Diabetes Federation (IDF): www.idf.org Contact a health care provider if:  Your blood glucose is at or above 240 mg/dL (13.3 mmol/L) for 2 days in a row.  You have been sick or have had a fever for 2 days or longer, and you are not getting better.  You have any of the following problems for more than 6 hours: ? You cannot eat or drink. ? You have nausea and vomiting. ? You have diarrhea. Get help right away if:  You have severe hypoglycemia. This means your blood glucose is lower than 54 mg/dL (3.0 mmol/L).  You become confused or you have trouble thinking clearly.  You have difficulty breathing.  You have moderate or large ketone levels in your urine. These symptoms may represent a serious problem that is an emergency. Do not wait to see if the symptoms will go away. Get medical help right away. Call your local emergency services (911 in the [place] Do not drive yourself to the hospital. Summary  Type 2 diabetes (type 2 diabetes mellitus) is a long-term, or chronic, disease. In type 2 diabetes, the pancreas does not make enough of a hormone called insulin, or cells in the body do not respond properly to insulin that the body makes (insulin  resistance).  This condition is treated by making dietary and lifestyle changes and taking diabetes medicines or insulin.  Your health care provider will set treatment goals for you. Your goals will be based on your age, other medical conditions you have, and how you respond to diabetes treatment.  Keep all follow-up visits as told by your health care provider. This is important. This information is not intended to replace advice given to you by your health care provider. Make sure you discuss any questions you have with your health care provider. Document Revised: 07/03/2020 Document Reviewed: 07/03/2020 Elsevier Patient Education  2021 [employer]  

## 2021-01-09 NOTE — Progress Notes (Signed)
Subjective:  Patient ID: Robert Church, male    DOB: Sep 25, 1956  Age: 65 y.o. MRN: [id number]  CC: Back Pain and Follow-up  This visit occurred during the SARS-CoV-2 public health emergency.  Safety protocols were in place, including screening questions prior to the visit, additional usage of staff PPE, and extensive cleaning of exam room while observing appropriate contact time as indicated for disinfecting solutions.    HPI Robert Church presents for hosp f/up - He was recently discharged after an admission for treatment of CHF.  He tells me his shortness of breath and lower extremity edema are much better.  Today he complains of chronic neck and low back pain.  He has had multiple falls, his last fall was about 2 months ago.  The low back pain does not radiate.  The neck pain is located on the right side and does radiate into his right upper extremity.  He also complains of paresthesias in his right hand.  Admit date: 12/17/2020  Discharge date: 12/30/2020   Admitted From: home  Disposition:  home   Recommendations for Outpatient Follow-up:   1.Follow up with PCP in 1-2 weeks  2.Patient COPD/OSA: Continue taking Breztri inhaler, Trilegy setup as outpatient   3.Currently on 6 L, will provide O2 on discharge, can continue to wean as outpatient   4.Follow-up arranged with cardiology/advanced heart failure.   Home Health:[facility]  Equipment/Devices:6 L Nasal Canula Oxygen, Trilogy device  Discharge Condition:Stable    CODE STATUS:FULL    Brief/Interim Summary:  History of present illness:    Robert Church is a 65 y.o. year old male with medical history significant for Chronic systolic and diastolic CHF, nonischemic cardiomyopathy, COPD, OSA with nonadherence to CPAP, chronic venous insufficiency, alcohol/tobacco abuse who presented with cough, congestion and difficulty breathing was found to have acute hypoxic respiratory failure secondary to CHF  exacerbation evident with elevated BNP and signs of pulmonary vascular congestion on chest x-ray as well as acute hypercarbic respiratory failure secondary to duration of COPD/OSA (PCO2 of 101 on ABG on admission). He required transfer to the ICU during hospital course due to need for persistent BiPAP use for acute hypercapnic respiratory failure while also being diuresed for his CHF with IV Lasix.  He was able to transition to oral torsemide and remained stable on 6 L.     Remaining hospital course addressed in problem based format below:   Hospital Course:  Hypotension secondary to intolerance to BP medications in setting of diuresis, improved.  Started after transitioning from IV lasix to oral torsemide with SBP nadir of 80s .  It delayed his initial discharge planned date of 1/7 and required 3 days of further titration of his BP medications and oral torsemide with the help of cardiology. He maintained adequate perfusion with normal lactic acid. in the setting of being diuresed 25 L during hospital stay his torsemide dose was reduced to minimal dose, amlodiine discontinued and losartan and spironolactone reduced as well. He tolerated that regimen on discharge day with SBP remaining stable at 101/85  - Cardiology recommends continuing torsemide at reduced dose of 10 mg  daily  - Discontinued amlodipine  -Reduced spironolactone to 12.5 mg daily and losartan 12.5 mg daily  --He has very close follow up with his cardiologist this week and instructed to closely monitor his weights, fluid intake and diet   Acute on chronic hypercapnic respiratory failure secondary to exacerbation of COPD and complicated by underlying OSA with poor  adherence to CPAP, stable on 6 L.  -Tolerating nightly BiPAP  -Respiratory status stable on 6 L  -At extremely high risk of repeat decompensation given multiple risk factors including chronic hypoxia, chronic lung disease, chronic heart disease with known pulmonary  hypertension for which pulmonology is recommending patient have trelogy vent  -Trelegy vent now set up for home care   Acute hypoxic respiratory failure, secondary to COPD/CHF/OSA exacerbation. Stable.   Euvolemic now, with no wheezing and tolerating BiPAP nightly. With ambulation required 6 L to maintain oxygen saturation greater than 88% with exertion on 1/6. On day of discharge maintained SpO2 of 97% with 6 L ( 92% on room air while ambulating). Despite sats patient will still need oxygen with close weaning as outpatient  -Now has trilogy vent set up at home on discharge  -Oral torsemide as recommended by cardiology  -Continue inhalers/steroids per home regimen  -Currently on 6 L, will provide O2 on discharge, can continue to wean as outpatient     COPD, stable. No wheezing on exam.  -Smoking cessation emphasized during admission as patient remains an active smoker  -Continue scheduled inhalers and home dose prednisone  -Resume home breztri at discharge     Acute on chronic combined diastolic CHF, stable.   Euvolemic currently, net -35 L this admission, current weight 210 pounds (261 pounds on admission)  -torsemide 10 mg  daily per cardiology  -Continue losartan12.5 mg, reduce dose of spironolactone 12.5 mg, discontinued amlodipine  - Beta-blocker contraindicated given COPD  - Entresto not covered by insurance.  Review assistance of Entresto and SGLT2 inhibitor as outpatient  -Follow-up with cardiology (Dr. Gwenlyn Found) at discharge on 1/14 and Advanced heart failure team ( Dr. Haroldine Laws) 1/18  -Emphasized importance of low-salt, daily weights, close monitoring of fluids on discharge with patient and his mother     Hyperlipidemia  -Continue Zetia given statin intolerance     GERD, stable  -continue PPI     Gout, stable  -continue allopurinol     Type II diabetes, controlled.  A1c 6.6  - Can resume home metformin     Obesity, class II.  - Long  discussion on d importance of following a diet high in [employer], low in processed foods to aid in weight loss and to be sure to avoid high salt foods that will worsen his heart failure     Consultations:  Cardiology, PCCM     Procedures/Studies:  TTE, 12/18/2020     1. Left ventricular ejection fraction, by estimation, is 40 to 45%. The  left ventricle has mildly decreased function. The left ventricle  demonstrates global hypokinesis. The left ventricular internal cavity size  was moderately dilated. There is mild  left ventricular hypertrophy. Left ventricular diastolic parameters are  consistent with Grade II diastolic dysfunction (pseudonormalization).  Elevated left atrial pressure.   2. Right ventricular systolic function is mildly reduced. The right  ventricular size is severely enlarged. There is moderately elevated  pulmonary artery systolic pressure. The estimated right ventricular  systolic pressure is 123456 mmHg.   3. Left atrial size was mildly dilated.   4. Right atrial size was mildly dilated.   5. The mitral valve is normal in structure. No evidence of mitral valve  regurgitation.   6. The aortic valve was not well visualized. Aortic valve regurgitation  is not visualized. No aortic stenosis is present.   7. There is mild dilatation of the ascending aorta, measuring 37 mm.   8. The  inferior vena cava is dilated in size with <50% respiratory  variability, suggesting right atrial pressure of 15 mmHg.     Discharge Diagnoses:   Principal Problem:    Acute CHF (congestive heart failure) (HCC)  Active Problems:    HTN (hypertension)    GERD (gastroesophageal reflux disease)    Obstructive sleep apnea syndrome    COPD, group D, by GOLD 2017 classification ([place])    Morbid obesity (HCC)    Chronic combined systolic and diastolic CHF (congestive heart failure) ([place])    Hyperlipidemia LDL goal <70    Dyslipidemia    Type II diabetes mellitus with  manifestations (HCC)    Thrombocytopenia (HCC)    Hypotension           Discharge Instructions          Has patient had a PCN reaction causing immediate rash, facial/tongue/throat swelling, SOB or lightheadedness with hypotension:Yes  Has patient had a PCN reaction causing severe rash involving mucus membranes or skin necrosis:unsure  Has patient had a PCN reaction that required hospitalization:unsure  Has patient had a PCN reaction occurring within the last 10 years:No  If all of the above answers are "NO", then may proceed with Cephalosporin use.   ake 1,000 Units by mouth daily.       EPINEPHrine 0.3 mg/0.3 mL Soaj injection  Commonly known as: EPI-PEN  Inject 0.3 mLs (0.3 mg total) into the muscle as needed for anaphylaxis.       ezetimibe 10 MG tablet  Commonly known as: Zetia  Take 1 tablet (10 mg total) by mouth daily.       losartan 25 MG tablet  Commonly known as: COZAAR  Take 0.5 tablets (12.5 mg total) by mouth daily.  What changed:   .medication strength   .how much to take        metFORMIN 750 MG 24 hr tablet  Commonly known as: Glucophage XR  Take 1 tablet (750 mg total) by mouth daily with breakfast.       montelukast 10 MG tablet  Commonly known as: Singulair  Take 1 tablet (10 mg total) by mouth at bedtime.       nabumetone 500 MG tablet  Commonly known as: Relafen  Take 1 tablet (500 mg total) by mouth 2 (two) times daily as needed.  What changed: reasons to take this       nicotine 21 mg/24hr patch  Commonly known as: NICODERM CQ - dosed in mg/24 hours  Place 1 patch (21 mg total) onto the skin daily.       NovoFine 32G X 6 MM Misc  Generic drug: Insulin Pen Needle  1 Act by Does not apply route once a week.       omeprazole 20 MG capsule  Commonly known as: PRILOSEC  Take 1 capsule (20 mg total) by mouth 2 (two) times daily before a meal.  What changed: when to take this        polyethylene glycol 17 g packet  Commonly known as: MIRALAX / GLYCOLAX  Take 17 g by mouth daily as needed.       predniSONE 10 MG tablet  Commonly known as: DELTASONE  Take 1 tablet (10 mg total) by mouth daily with breakfast.       roflumilast 500 MCG Tabs tablet  Commonly known as: DALIRESP  Take 1 tablet (500 mcg total) by mouth daily.       Spiriva Respimat 2.5 MCG/ACT Aers  Generic drug: Tiotropium Bromide Monohydrate  Inhale 2.5 mcg into the lungs 2 (two) times daily.       spironolactone 25 MG tablet  Commonly known as: ALDACTONE  Take 0.5 tablets (12.5 mg total) by mouth daily.       torsemide 10 MG tablet  Commonly known as: DEMADEX  Take 1 tablet (10 mg total) by mouth daily.  Start taking on: December 31, 2020  What changed:   .medication strength   .how much to take   .Another medication with the same name was removed. Continue taking this medication, and follow the directions you see here.        traMADol 50 MG tablet  Commonly known as: ULTRAM  Take 50 mg by mouth every 6 (six) hours as needed for severe pain.     [name], MD. Go on 01/03/2021.     Outpatient Medications Prior to Visit  Medication Sig Dispense Refill  . albuterol (PROVENTIL) (2.5 MG/3ML) 0.083% nebulizer solution Take 3 mLs (2.5 mg total) by nebulization every 6 (six) hours as needed for wheezing or shortness of breath. 75 mL 12  . albuterol (VENTOLIN HFA) 108 (90 Base) MCG/ACT inhaler Inhale 2 puffs into the lungs every 6 (six) hours as needed for wheezing. 18 g 2  . allopurinol (ZYLOPRIM) 300 MG tablet Take 1 tablet (300 mg total) by mouth daily. 90 tablet 1  . Azelastine-Fluticasone 137-50 MCG/ACT SUSP 1 spray each nostril twice daily 23 g 6  . Budeson-Glycopyrrol-Formoterol (BREZTRI AEROSPHERE) 160-9-4.8 MCG/ACT AERO Inhale 2 puffs into the lungs in the morning and at bedtime. 5.9 g 2  . cholecalciferol (VITAMIN D) 1000 units tablet  Take 1,000 Units by mouth daily.    Marland Kitchen losartan (COZAAR) 25 MG tablet Take 0.5 tablets (12.5 mg total) by mouth daily. 45 tablet 1  . metFORMIN (GLUCOPHAGE XR) 750 MG 24 hr tablet Take 1 tablet (750 mg total) by mouth daily with breakfast. 90 tablet 1  . montelukast (SINGULAIR) 10 MG tablet Take 1 tablet (10 mg total) by mouth at bedtime. 30 tablet 2  . nicotine (NICODERM CQ - DOSED IN MG/24 HOURS) 21 mg/24hr patch Place 1 patch (21 mg total) onto the skin daily. 28 patch 0  . omeprazole (PRILOSEC) 20 MG capsule Take 1 capsule (20 mg total) by mouth 2 (two) times daily before a meal. (Patient taking differently: Take 20 mg by mouth daily.) 180 capsule 1  . polyethylene glycol (MIRALAX / GLYCOLAX) 17 g packet Take 17 g by mouth daily as needed. 14 each 0  . predniSONE (DELTASONE) 10 MG tablet Take 1 tablet (10 mg total) by mouth daily with breakfast. 30 tablet 2  . roflumilast (DALIRESP) 500 MCG TABS tablet Take 1 tablet (500 mcg total) by mouth daily. 30 tablet 3  . spironolactone (ALDACTONE) 25 MG tablet Take 0.5 tablets (12.5 mg total) by mouth daily. 60 tablet 2  . Tiotropium Bromide Monohydrate (SPIRIVA RESPIMAT) 2.5 MCG/ACT AERS Inhale 2.5 mcg into the lungs 2 (two) times daily. 4 g 11  . torsemide (DEMADEX) 10 MG tablet Take 1 tablet (10 mg total) by mouth daily. 45 tablet 1  . nabumetone (RELAFEN) 500 MG tablet Take 1 tablet (500 mg total) by mouth 2 (two) times daily as needed. (Patient taking differently: Take 500 mg by mouth 2 (two) times daily as needed for moderate pain.) 180 tablet 1   No facility-administered medications prior to visit.    ROS Review of Systems  Constitutional:  Negative for appetite change, diaphoresis, fatigue and unexpected weight change.  HENT: Negative.   Eyes: Negative for visual disturbance.  Respiratory: Positive for shortness of breath. Negative for cough, chest tightness and wheezing.   Cardiovascular: Positive for leg swelling. Negative for chest pain  and palpitations.  Gastrointestinal: Negative for abdominal pain, constipation, diarrhea, nausea and vomiting.  Endocrine: Negative.   Genitourinary: Negative.  Negative for difficulty urinating and dysuria.  Musculoskeletal: Positive for arthralgias, back pain, myalgias and neck pain.  Skin: Negative.   Neurological: Positive for numbness. Negative for dizziness, weakness, light-headedness and headaches.  Hematological: Negative for adenopathy. Does not bruise/bleed easily.  Psychiatric/Behavioral: Negative.     Objective:  BP 138/80 (BP Location: Left Arm, Patient Position: Sitting, Cuff Size: Normal)   Pulse 63   Temp 97.9 F (36.6 C) (Oral)   Resp 16   Ht 5\' 10"  (1.778 m)   Wt 216 lb 6 oz (98.1 kg)   SpO2 94%   BMI 31.05 kg/m   BP Readings from Last 3 Encounters:  01/09/21 138/80  01/07/21 118/80  01/03/21 92/70    Wt Readings from Last 3 Encounters:  01/09/21 216 lb 6 oz (98.1 kg)  01/07/21 220 lb (99.8 kg)  01/03/21 216 lb (98 kg)    Physical Exam Vitals reviewed.  HENT:     Nose: Nose normal.  Eyes:     General: No scleral icterus.    Conjunctiva/sclera: Conjunctivae normal.  Cardiovascular:     Rate and Rhythm: Normal rate. Frequent extrasystoles are present.    Heart sounds: No murmur heard.   Pulmonary:     Effort: Pulmonary effort is normal.     Breath sounds: No stridor. No wheezing, rhonchi or rales.  Abdominal:     General: Abdomen is protuberant. Bowel sounds are normal. There is no distension.     Palpations: Abdomen is soft. There is no hepatomegaly, splenomegaly or mass.     Tenderness: There is no abdominal tenderness.  Musculoskeletal:        General: No tenderness. Normal range of motion.     Cervical back: Neck supple.     Right lower leg: Edema (trace pitting) present.     Left lower leg: Edema (trace pitting) present.  Skin:    General: Skin is warm and dry.  Neurological:     General: No focal deficit present.     Mental Status:  He is alert and oriented to person, place, and time.  Psychiatric:        Mood and Affect: Mood normal.        Behavior: Behavior normal.     Lab Results  Component Value Date   WBC 11.4 (H) 12/28/2020   HGB 17.4 (H) 12/28/2020   HCT 53.9 (H) 12/28/2020   PLT 211 12/28/2020   GLUCOSE 88 01/07/2021   CHOL 177 07/10/2020   TRIG 150 (H) 07/10/2020   HDL 43 07/10/2020   LDLCALC 108 (H) 07/10/2020   ALT 68 (H) 12/30/2020   AST 44 (H) 12/30/2020   NA 135 01/07/2021   K 4.7 01/07/2021   CL 99 01/07/2021   CREATININE 0.77 01/07/2021   BUN 14 01/07/2021   CO2 26 01/07/2021   TSH 0.61 07/10/2020   PSA 1.7 07/10/2020   INR 1.0 04/15/2017   HGBA1C 6.6 (H) 11/28/2020    No results found.  Assessment & Plan:   Lj was seen today for back pain and follow-up.  Diagnoses and all orders for this  visit:  Type II diabetes mellitus with manifestations ([place])- His recent A1c was 6.6%.  His blood sugar is adequately well controlled. -     HM Diabetes Foot Exam -     Ambulatory referral to Ophthalmology  Chronic neck pain-  -     DG Cervical Spine Complete; Future  Chronic bilateral low back pain without sciatica- Plain films are positive for degenerative changes.  I recommended that he avoid NSAIDs. -     DG Lumbar Spine Complete; Future  DDD (degenerative disc disease), cervical -     Ambulatory referral to Neurosurgery  Foraminal stenosis of cervical region- He has symptoms, and exam, and x-ray concerning for foraminal stenosis.  I have asked him to see neurosurgery to consider treatment options. -     Ambulatory referral to Neurosurgery   I have discontinued Olene Craven. Subramaniam's nabumetone. I am also having him maintain his Azelastine-Fluticasone, cholecalciferol, roflumilast, omeprazole, montelukast, albuterol, allopurinol, metFORMIN, predniSONE, albuterol, Spiriva Respimat, Breztri Aerosphere, nicotine, spironolactone, torsemide, polyethylene glycol, and losartan.  No orders of  the defined types were placed in this encounter.    Follow-up: Return in about 3 months (around 04/09/2021).  Scarlette Calico, MD

## 2021-01-10 ENCOUNTER — Telehealth: Payer: Self-pay | Admitting: Emergency Medicine

## 2021-01-10 DIAGNOSIS — M47816 Spondylosis without myelopathy or radiculopathy, lumbar region: Secondary | ICD-10-CM | POA: Insufficient documentation

## 2021-01-10 DIAGNOSIS — M4802 Spinal stenosis, cervical region: Secondary | ICD-10-CM | POA: Insufficient documentation

## 2021-01-10 MED ORDER — LEVOFLOXACIN 500 MG PO TABS: 500.0000 mg | ORAL_TABLET | Freq: Every day | ORAL | 0 refills | Status: DC

## 2021-01-10 NOTE — Telephone Encounter (Signed)
Called and spoke with pt and he stated that yesterday he started with a cough with pale yellow sputum.  Pt stated that he had an rx for levaquin that he had taken 2 tablets out of before he went into the hospital for CHF.   He stated that this got misplaced.  Pt is requesting that RB send in a new rx for the levaquin.  RB please advise. Pt denies any other symptoms.  Please advise. Thanks   Allergies  Allergen Reactions  . Clindamycin Other (See Comments)    Tongue swelling  . Doxycycline     Severe stomach upset per patient  . E-Mycin [Erythromycin Base] Swelling  . Penicillins Swelling and Other (See Comments)    Childhood rxn--MD stated he "almost died" Has patient had a PCN reaction causing immediate rash, facial/tongue/throat swelling, SOB or lightheadedness with hypotension:Yes Has patient had a PCN reaction causing severe rash involving mucus membranes or skin necrosis:unsure Has patient had a PCN reaction that required hospitalization:unsure Has patient had a PCN reaction occurring within the last 10 years:No If all of the above answers are "NO", then may proceed with Cephalosporin use.    . Rosuvastatin     Other reaction(s): Myalgias (intolerance)    Current Outpatient Medications on File Prior to Visit  Medication Sig Dispense Refill  . albuterol (PROVENTIL) (2.5 MG/3ML) 0.083% nebulizer solution Take 3 mLs (2.5 mg total) by nebulization every 6 (six) hours as needed for wheezing or shortness of breath. 75 mL 12  . albuterol (VENTOLIN HFA) 108 (90 Base) MCG/ACT inhaler Inhale 2 puffs into the lungs every 6 (six) hours as needed for wheezing. 18 g 2  . allopurinol (ZYLOPRIM) 300 MG tablet Take 1 tablet (300 mg total) by mouth daily. 90 tablet 1  . Azelastine-Fluticasone 137-50 MCG/ACT SUSP 1 spray each nostril twice daily 23 g 6  . Budeson-Glycopyrrol-Formoterol (BREZTRI AEROSPHERE) 160-9-4.8 MCG/ACT AERO Inhale 2 puffs into the lungs in the morning and at bedtime. 5.9 g 2  .  cholecalciferol (VITAMIN D) 1000 units tablet Take 1,000 Units by mouth daily.    Marland Kitchen losartan (COZAAR) 25 MG tablet Take 0.5 tablets (12.5 mg total) by mouth daily. 45 tablet 1  . metFORMIN (GLUCOPHAGE XR) 750 MG 24 hr tablet Take 1 tablet (750 mg total) by mouth daily with breakfast. 90 tablet 1  . montelukast (SINGULAIR) 10 MG tablet Take 1 tablet (10 mg total) by mouth at bedtime. 30 tablet 2  . nicotine (NICODERM CQ - DOSED IN MG/24 HOURS) 21 mg/24hr patch Place 1 patch (21 mg total) onto the skin daily. 28 patch 0  . omeprazole (PRILOSEC) 20 MG capsule Take 1 capsule (20 mg total) by mouth 2 (two) times daily before a meal. (Patient taking differently: Take 20 mg by mouth daily.) 180 capsule 1  . polyethylene glycol (MIRALAX / GLYCOLAX) 17 g packet Take 17 g by mouth daily as needed. 14 each 0  . predniSONE (DELTASONE) 10 MG tablet Take 1 tablet (10 mg total) by mouth daily with breakfast. 30 tablet 2  . roflumilast (DALIRESP) 500 MCG TABS tablet Take 1 tablet (500 mcg total) by mouth daily. 30 tablet 3  . spironolactone (ALDACTONE) 25 MG tablet Take 0.5 tablets (12.5 mg total) by mouth daily. 60 tablet 2  . Tiotropium Bromide Monohydrate (SPIRIVA RESPIMAT) 2.5 MCG/ACT AERS Inhale 2.5 mcg into the lungs 2 (two) times daily. 4 g 11  . torsemide (DEMADEX) 10 MG tablet Take 1 tablet (10 mg total)  by mouth daily. 45 tablet 1   No current facility-administered medications on file prior to visit.

## 2021-01-10 NOTE — Telephone Encounter (Signed)
Called and spoke with pt and he is aware of levaquin that has been sent to his pharmacy.  Pt voiced his understanding and nothing further is needed.

## 2021-01-10 NOTE — Telephone Encounter (Signed)
Ok to give him levaquin 500mg  qd x 5 days

## 2021-01-13 ENCOUNTER — Ambulatory Visit: Payer: 59 | Admitting: Cardiovascular Disease

## 2021-01-13 ENCOUNTER — Encounter: Payer: Self-pay | Admitting: Internal Medicine

## 2021-01-13 ENCOUNTER — Telehealth: Payer: Self-pay | Admitting: Internal Medicine

## 2021-01-13 NOTE — Telephone Encounter (Signed)
Patient wondering if he can get his x-ray results from 01.20.22.  Also wondering if he can get a refill ALPRAZolam Duanne Moron) 1 MG tablet [id number] [address], [place] AT  [facility] [place] Phone:  [phone]  Fax:  [phone]     Last seen- 11.20.22 Next apt- n/a

## 2021-01-13 NOTE — Telephone Encounter (Signed)
Pt has been informed of xray results.   He stated he is trying to ween himself off and would like just a few sent in to help because he stated that he read that he could have seizures from not having them at all. Please advise.

## 2021-01-13 NOTE — Telephone Encounter (Signed)
Please advise. I do not see any notations from PCP.

## 2021-01-13 NOTE — Telephone Encounter (Signed)
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

## 2021-01-14 ENCOUNTER — Other Ambulatory Visit: Payer: Self-pay | Admitting: Internal Medicine

## 2021-01-14 ENCOUNTER — Other Ambulatory Visit: Payer: Self-pay

## 2021-01-14 DIAGNOSIS — F411 Generalized anxiety disorder: Secondary | ICD-10-CM

## 2021-01-14 DIAGNOSIS — L03311 Cellulitis of abdominal wall: Secondary | ICD-10-CM

## 2021-01-14 DIAGNOSIS — L03119 Cellulitis of unspecified part of limb: Secondary | ICD-10-CM

## 2021-01-14 DIAGNOSIS — M546 Pain in thoracic spine: Secondary | ICD-10-CM

## 2021-01-14 MED ORDER — ALPRAZOLAM 0.25 MG PO TABS: 0.2500 mg | ORAL_TABLET | Freq: Two times a day (BID) | ORAL | 1 refills | Status: DC | PRN

## 2021-01-15 DIAGNOSIS — Z6832 Body mass index (BMI) 32.0-32.9, adult: Secondary | ICD-10-CM | POA: Insufficient documentation

## 2021-01-16 ENCOUNTER — Other Ambulatory Visit: Payer: Self-pay

## 2021-01-16 DIAGNOSIS — L03311 Cellulitis of abdominal wall: Secondary | ICD-10-CM

## 2021-01-16 DIAGNOSIS — M546 Pain in thoracic spine: Secondary | ICD-10-CM

## 2021-01-17 ENCOUNTER — Ambulatory Visit (HOSPITAL_COMMUNITY): Admit: 2021-01-17 | Payer: 59

## 2021-01-17 ENCOUNTER — Other Ambulatory Visit (HOSPITAL_COMMUNITY): Payer: Self-pay | Admitting: Neurological Surgery

## 2021-01-17 ENCOUNTER — Ambulatory Visit (HOSPITAL_COMMUNITY): Payer: 59

## 2021-01-17 DIAGNOSIS — M47816 Spondylosis without myelopathy or radiculopathy, lumbar region: Secondary | ICD-10-CM

## 2021-01-17 DIAGNOSIS — M5412 Radiculopathy, cervical region: Secondary | ICD-10-CM

## 2021-01-20 ENCOUNTER — Encounter (HOSPITAL_COMMUNITY): Payer: 59

## 2021-01-20 ENCOUNTER — Ambulatory Visit: Payer: 59

## 2021-01-21 ENCOUNTER — Encounter (HOSPITAL_COMMUNITY): Payer: 59

## 2021-01-21 ENCOUNTER — Encounter: Payer: 59 | Admitting: Vascular Surgery

## 2021-01-24 ENCOUNTER — Ambulatory Visit (HOSPITAL_COMMUNITY): Payer: 59

## 2021-01-27 ENCOUNTER — Telehealth: Payer: Self-pay | Admitting: Internal Medicine

## 2021-01-27 NOTE — Telephone Encounter (Signed)
Okay, we will need to review records. Can schedule an office visit with me or APP

## 2021-01-27 NOTE — Telephone Encounter (Signed)
Hi Dr. Hilarie Fredrickson., this patient would like to transfer his GI care from [place] over to our practice because he has new insurance and they are out of network. It looks like Dr. Lyndel Safe had previously reviewed pt's records. However, he lives in [place] and is requesting to be your patient. Records from [facility] are in [place]. Could you please review them and advise if you would accept patient? He needs to be sen for rectal pain. Thank you

## 2021-01-29 ENCOUNTER — Ambulatory Visit: Payer: 59 | Admitting: Vascular Surgery

## 2021-01-29 NOTE — Telephone Encounter (Signed)
Called pt to offer cancellation on 2/82 but it conflicted with another appt that he had. I will call pt when schedule for April is available.

## 2021-02-01 ENCOUNTER — Ambulatory Visit (HOSPITAL_COMMUNITY): Admission: RE | Admit: 2021-02-01 | Payer: 59 | Source: Ambulatory Visit

## 2021-02-03 NOTE — Progress Notes (Signed)
Patient states that he is canceling his MRI because he can not afford it at this time.

## 2021-02-04 ENCOUNTER — Ambulatory Visit: Payer: 59 | Admitting: Family

## 2021-02-04 ENCOUNTER — Encounter (HOSPITAL_COMMUNITY): Payer: Self-pay

## 2021-02-04 ENCOUNTER — Telehealth (HOSPITAL_COMMUNITY): Payer: Self-pay | Admitting: *Deleted

## 2021-02-04 ENCOUNTER — Ambulatory Visit: Admit: 2021-02-04 | Payer: 59

## 2021-02-04 ENCOUNTER — Ambulatory Visit (HOSPITAL_COMMUNITY): Admission: RE | Admit: 2021-02-04 | Payer: 59 | Source: Ambulatory Visit

## 2021-02-04 SURGERY — MRI WITH ANESTHESIA
Anesthesia: General

## 2021-02-04 NOTE — Telephone Encounter (Signed)
Try changing Losartan to bedtime and see if symptoms resolve. If not, can change spiro to bedtime.   Also has diabetes. Check to see that he is checking blood glucose regularly at home and that levels are controlled. If glucose running high, he will need to f/u w/ PCP.   Also make sure he is taking medications w/ food.    Lyda Jester, PA-C

## 2021-02-04 NOTE — Telephone Encounter (Signed)
Left detailed vm as requested by patient.

## 2021-02-04 NOTE — Telephone Encounter (Signed)
Pt called stating an hour after taking his medications he has blurred vision, dizziness, and is light headed. He said it comes and goes. His bp is stable 125/72. Pt checks his bp during these episodes. pts weight is stable 215lbs-218lbs.   Routed to [facility]

## 2021-02-05 ENCOUNTER — Encounter: Payer: Self-pay | Admitting: Internal Medicine

## 2021-02-05 ENCOUNTER — Ambulatory Visit: Payer: 59 | Admitting: Vascular Surgery

## 2021-02-05 NOTE — Telephone Encounter (Signed)
Consult scheduled on 03/26/21 at 1:50pm.

## 2021-02-06 ENCOUNTER — Other Ambulatory Visit: Payer: Self-pay

## 2021-02-06 ENCOUNTER — Ambulatory Visit (INDEPENDENT_AMBULATORY_CARE_PROVIDER_SITE_OTHER): Payer: 59 | Admitting: Internal Medicine

## 2021-02-06 ENCOUNTER — Encounter: Payer: Self-pay | Admitting: Internal Medicine

## 2021-02-06 VITALS — BP 138/82 | HR 92 | Temp 98.5°F | Ht 70.0 in | Wt 222.0 lb

## 2021-02-06 DIAGNOSIS — M7022 Olecranon bursitis, left elbow: Secondary | ICD-10-CM

## 2021-02-06 DIAGNOSIS — J449 Chronic obstructive pulmonary disease, unspecified: Secondary | ICD-10-CM | POA: Diagnosis not present

## 2021-02-06 DIAGNOSIS — J302 Other seasonal allergic rhinitis: Secondary | ICD-10-CM | POA: Diagnosis not present

## 2021-02-06 MED ORDER — MONTELUKAST SODIUM 10 MG PO TABS: 10.0000 mg | ORAL_TABLET | Freq: Every day | ORAL | 5 refills | Status: DC

## 2021-02-06 MED ORDER — MOMETASONE FUROATE 50 MCG/ACT NA SUSP: 2.0000 | Freq: Every day | NASAL | 5 refills | Status: DC

## 2021-02-06 NOTE — Progress Notes (Signed)
Patient ID: Robert Church, male   DOB: 22-Mar-1956, 65 y.o.   MRN: [id number]        Chief Complaint:  Left elbow tip swelling and allergy symptoms       HPI:  Robert Church is a 65 y.o. male here with c/o 1 wk onset tip of left elbow swelling acute onset, was mod tender and larger to start, but today less than half the size and minimal tender, just wondering if anything needs to be done; has been sitting in his favorite chair with left elbow on hard surface quite a bit in recent weeks.  Also, Does have several wks ongoing nasal allergy symptoms with clearish congestion, itch and sneezing, without fever, pain, ST, cough, swelling or wheezing.  Currently not taking any allergy med for seasonal flares. .        Wt Readings from Last 3 Encounters:  02/06/21 222 lb (100.7 kg)  01/09/21 216 lb 6 oz (98.1 kg)  01/07/21 220 lb (99.8 kg)   BP Readings from Last 3 Encounters:  02/06/21 138/82  01/09/21 138/80  01/07/21 118/80         Past Medical History:  Diagnosis Date  . Allergy   . Asthma   . Bronchitis   . CHF (congestive heart failure) ([place])   . COPD (chronic obstructive pulmonary disease) ([place])   . GERD (gastroesophageal reflux disease)   . Hypertension   . Pneumonia   . Sinusitis   . SOB (shortness of breath)    Past Surgical History:  Procedure Laterality Date  . LEFT HEART CATH AND CORONARY ANGIOGRAPHY N/A 04/19/2017   Procedure: Left Heart Cath and Coronary Angiography;  Surgeon: Belva Crome, MD;  Location: [facility];  Service: Cardiovascular;  Laterality: N/A;  . TONSILLECTOMY      reports that he quit smoking about 4 weeks ago. His smoking use included cigarettes. He has a 11.50 pack-year smoking history. He has never used smokeless tobacco. He reports current alcohol use. He reports that he does not use drugs. family history includes Brain cancer in his father; Lung cancer in his father. Allergies  Allergen Reactions  . Clindamycin Other (See Comments)     Tongue swelling  . Doxycycline     Severe stomach upset per patient  . E-Mycin [Erythromycin Base] Swelling  . Penicillins Swelling and Other (See Comments)    Childhood rxn--MD stated he "almost died" Has patient had a PCN reaction causing immediate rash, facial/tongue/throat swelling, SOB or lightheadedness with hypotension:Yes Has patient had a PCN reaction causing severe rash involving mucus membranes or skin necrosis:unsure Has patient had a PCN reaction that required hospitalization:unsure Has patient had a PCN reaction occurring within the last 10 years:No If all of the above answers are "NO", then may proceed with Cephalosporin use.    . Rosuvastatin     Other reaction(s): Myalgias (intolerance)   Current Outpatient Medications on File Prior to Visit  Medication Sig Dispense Refill  . albuterol (PROVENTIL) (2.5 MG/3ML) 0.083% nebulizer solution Take 3 mLs (2.5 mg total) by nebulization every 6 (six) hours as needed for wheezing or shortness of breath. 75 mL 12  . albuterol (VENTOLIN HFA) 108 (90 Base) MCG/ACT inhaler Inhale 2 puffs into the lungs every 6 (six) hours as needed for wheezing. 18 g 2  . allopurinol (ZYLOPRIM) 300 MG tablet Take 1 tablet (300 mg total) by mouth daily. 90 tablet 1  . ALPRAZolam (XANAX) 0.25 MG tablet Take 1 tablet (  0.25 mg total) by mouth 2 (two) times daily as needed for anxiety. 60 tablet 1  . Budeson-Glycopyrrol-Formoterol (BREZTRI AEROSPHERE) 160-9-4.8 MCG/ACT AERO Inhale 2 puffs into the lungs in the morning and at bedtime. 5.9 g 2  . cholecalciferol (VITAMIN D) 1000 units tablet Take 1,000 Units by mouth daily.    Marland Kitchen losartan (COZAAR) 25 MG tablet Take 0.5 tablets (12.5 mg total) by mouth daily. 45 tablet 1  . metFORMIN (GLUCOPHAGE XR) 750 MG 24 hr tablet Take 1 tablet (750 mg total) by mouth daily with breakfast. 90 tablet 1  . nicotine (NICODERM CQ - DOSED IN MG/24 HOURS) 21 mg/24hr patch Place 1 patch (21 mg total) onto the skin daily. 28  patch 0  . omeprazole (PRILOSEC) 20 MG capsule Take 1 capsule (20 mg total) by mouth 2 (two) times daily before a meal. (Patient taking differently: Take 20 mg by mouth daily.) 180 capsule 1  . polyethylene glycol (MIRALAX / GLYCOLAX) 17 g packet Take 17 g by mouth daily as needed. 14 each 0  . predniSONE (DELTASONE) 10 MG tablet Take 1 tablet (10 mg total) by mouth daily with breakfast. 30 tablet 2  . roflumilast (DALIRESP) 500 MCG TABS tablet Take 1 tablet (500 mcg total) by mouth daily. 30 tablet 3  . spironolactone (ALDACTONE) 25 MG tablet Take 0.5 tablets (12.5 mg total) by mouth daily. 60 tablet 2  . Tiotropium Bromide Monohydrate (SPIRIVA RESPIMAT) 2.5 MCG/ACT AERS Inhale 2.5 mcg into the lungs 2 (two) times daily. 4 g 11  . torsemide (DEMADEX) 10 MG tablet Take 1 tablet (10 mg total) by mouth daily. 45 tablet 1  . albuterol (VENTOLIN HFA) 108 (90 Base) MCG/ACT inhaler Inhale into the lungs.    Marland Kitchen alprazolam (XANAX) 2 MG tablet Take by mouth.    . mometasone-formoterol (DULERA) 200-5 MCG/ACT AERO Dulera 200 mcg-5 mcg/actuation HFA aerosol inhaler  INHALE 2 PUFFS BY MOUTH FIRST THING IN THE MORNING AND THEN ANOTHER 2 PUFFS ABOUT 12 HOURS LATER    . omeprazole (PRILOSEC OTC) 20 MG tablet     . polyethylene glycol powder (GLYCOLAX/MIRALAX) 17 GM/SCOOP powder polyethylene glycol 3350 17 gram oral powder packet  MIX ONE PACKET IN 8 OUNCES OF LIQUID AND DRINK BY MOUTH DAILY AS NEEDED    . Tiotropium Bromide Monohydrate (SPIRIVA RESPIMAT) 2.5 MCG/ACT AERS Inhale 2.5 mcg into the lungs 2 (two) times daily.    Marland Kitchen torsemide (DEMADEX) 20 MG tablet Take by mouth.     No current facility-administered medications on file prior to visit.        ROS:  All others reviewed and negative.  Objective        PE:  BP 138/82   Pulse 92   Temp 98.5 F (36.9 C) (Oral)   Ht 5\' 10"  (1.778 m)   Wt 222 lb (100.7 kg)   SpO2 96%   BMI 31.85 kg/m                 Constitutional: Pt appears in NAD                HENT: Head: NCAT.                Right Ear: External ear normal.                 Left Ear: External ear normal.                Bilat tm's with mild erythema.  Max sinus areas none tender.  Pharynx with mild erythema, no exudate               Eyes: . Pupils are equal, round, and reactive to light. Conjunctivae and EOM are normal               Nose: without d/c or deformity               Neck: Neck supple. Gross normal ROM               Cardiovascular: Normal rate and regular rhythm.                 Pulmonary/Chest: Effort normal and breath sounds without rales or wheezing.                Left olecranon with 1+ bursa swelling but minimal tender, no overlying skin erythema or rash               Neurological: Pt is alert. At baseline orientation, motor grossly intact               Skin: Skin is warm. No rashes, no other new lesions, LE edema - none               Psychiatric: Pt behavior is normal without agitation   Micro: none  Cardiac tracings I have personally interpreted today:  none  Pertinent Radiological findings (summarize): none   Lab Results  Component Value Date   WBC 11.4 (H) 12/28/2020   HGB 17.4 (H) 12/28/2020   HCT 53.9 (H) 12/28/2020   PLT 211 12/28/2020   GLUCOSE 88 01/07/2021   CHOL 177 07/10/2020   TRIG 150 (H) 07/10/2020   HDL 43 07/10/2020   LDLCALC 108 (H) 07/10/2020   ALT 68 (H) 12/30/2020   AST 44 (H) 12/30/2020   NA 135 01/07/2021   K 4.7 01/07/2021   CL 99 01/07/2021   CREATININE 0.77 01/07/2021   BUN 14 01/07/2021   CO2 26 01/07/2021   TSH 0.61 07/10/2020   PSA 1.7 07/10/2020   INR 1.0 04/15/2017   HGBA1C 6.6 (H) 11/28/2020   Assessment/Plan:  Vito Beg is a 65 y.o. White or Caucasian [1] male with  has a past medical history of Allergy, Asthma, Bronchitis, CHF (congestive heart failure) ([place]), COPD (chronic obstructive pulmonary disease) ([place]), GERD (gastroesophageal reflux disease), Hypertension, Pneumonia, Sinusitis, and SOB (shortness  of breath).  Olecranon bursitis Mild to mod, already improving, and will recommend conservative management without specific tx for now,  to f/u any worsening symptoms or concerns    Seasonal allergic rhinitis Mild to mod, for med restart - singulair, nasonex,  to f/u any worsening symptoms or concerns   Current Outpatient Medications (Endocrine & Metabolic):  .  metFORMIN (GLUCOPHAGE XR) 750 MG 24 hr tablet, Take 1 tablet (750 mg total) by mouth daily with breakfast. .  predniSONE (DELTASONE) 10 MG tablet, Take 1 tablet (10 mg total) by mouth daily with breakfast.  Current Outpatient Medications (Cardiovascular):  .  losartan (COZAAR) 25 MG tablet, Take 0.5 tablets (12.5 mg total) by mouth daily. Marland Kitchen  spironolactone (ALDACTONE) 25 MG tablet, Take 0.5 tablets (12.5 mg total) by mouth daily. Marland Kitchen  torsemide (DEMADEX) 10 MG tablet, Take 1 tablet (10 mg total) by mouth daily. Marland Kitchen  torsemide (DEMADEX) 20 MG tablet, Take by mouth.  Current Outpatient Medications (Respiratory):  .  albuterol (PROVENTIL) (2.5 MG/3ML) 0.083% nebulizer solution, Take 3 mLs (  2.5 mg total) by nebulization every 6 (six) hours as needed for wheezing or shortness of breath. Marland Kitchen  albuterol (VENTOLIN HFA) 108 (90 Base) MCG/ACT inhaler, Inhale 2 puffs into the lungs every 6 (six) hours as needed for wheezing. .  Budeson-Glycopyrrol-Formoterol (BREZTRI AEROSPHERE) 160-9-4.8 MCG/ACT AERO, Inhale 2 puffs into the lungs in the morning and at bedtime. .  mometasone (NASONEX) 50 MCG/ACT nasal spray, Place 2 sprays into the nose daily. .  roflumilast (DALIRESP) 500 MCG TABS tablet, Take 1 tablet (500 mcg total) by mouth daily. .  Tiotropium Bromide Monohydrate (SPIRIVA RESPIMAT) 2.5 MCG/ACT AERS, Inhale 2.5 mcg into the lungs 2 (two) times daily. Marland Kitchen  albuterol (VENTOLIN HFA) 108 (90 Base) MCG/ACT inhaler, Inhale into the lungs. .  mometasone-formoterol (DULERA) 200-5 MCG/ACT AERO, Dulera 200 mcg-5 mcg/actuation HFA aerosol inhaler  INHALE 2  PUFFS BY MOUTH FIRST THING IN THE MORNING AND THEN ANOTHER 2 PUFFS ABOUT 12 HOURS LATER .  montelukast (SINGULAIR) 10 MG tablet, Take 1 tablet (10 mg total) by mouth at bedtime. .  Tiotropium Bromide Monohydrate (SPIRIVA RESPIMAT) 2.5 MCG/ACT AERS, Inhale 2.5 mcg into the lungs 2 (two) times daily.  Current Outpatient Medications (Analgesics):  .  allopurinol (ZYLOPRIM) 300 MG tablet, Take 1 tablet (300 mg total) by mouth daily.   Current Outpatient Medications (Other):  Marland Kitchen  ALPRAZolam (XANAX) 0.25 MG tablet, Take 1 tablet (0.25 mg total) by mouth 2 (two) times daily as needed for anxiety. .  cholecalciferol (VITAMIN D) 1000 units tablet, Take 1,000 Units by mouth daily. .  nicotine (NICODERM CQ - DOSED IN MG/24 HOURS) 21 mg/24hr patch, Place 1 patch (21 mg total) onto the skin daily. Marland Kitchen  omeprazole (PRILOSEC) 20 MG capsule, Take 1 capsule (20 mg total) by mouth 2 (two) times daily before a meal. (Patient taking differently: Take 20 mg by mouth daily.) .  polyethylene glycol (MIRALAX / GLYCOLAX) 17 g packet, Take 17 g by mouth daily as needed. Marland Kitchen  alprazolam (XANAX) 2 MG tablet, Take by mouth. Marland Kitchen  omeprazole (PRILOSEC OTC) 20 MG tablet,  .  polyethylene glycol powder (GLYCOLAX/MIRALAX) 17 GM/SCOOP powder, polyethylene glycol 3350 17 gram oral powder packet  MIX ONE PACKET IN 8 OUNCES OF LIQUID AND DRINK BY MOUTH DAILY AS NEEDED   COPD, group D, by GOLD 2017 classification ([place]) Stable, cont current med tx - ventolin, dulera.,  to f/u any worsening symptoms or concerns     Followup: Return if symptoms worsen or fail to improve.  Cathlean Cower, MD 02/08/2021 5:29 AM [facility]

## 2021-02-08 ENCOUNTER — Encounter: Payer: Self-pay | Admitting: Internal Medicine

## 2021-02-08 DIAGNOSIS — M7020 Olecranon bursitis, unspecified elbow: Secondary | ICD-10-CM | POA: Insufficient documentation

## 2021-02-08 DIAGNOSIS — M7022 Olecranon bursitis, left elbow: Secondary | ICD-10-CM | POA: Insufficient documentation

## 2021-02-08 NOTE — Patient Instructions (Signed)
Please keep pressure off the left elbow and this should heal  Please continue all other medications as before, including to restart your allergy medications  Please have the pharmacy call with any other refills you may need.  Please continue your efforts at being more active, low cholesterol diet, and weight control.  Please keep your appointments with your specialists as you may have planned

## 2021-02-08 NOTE — Assessment & Plan Note (Signed)
Mild to mod, for med restart - singulair, nasonex,  to f/u any worsening symptoms or concerns   Current Outpatient Medications (Endocrine & Metabolic):  .  metFORMIN (GLUCOPHAGE XR) 750 MG 24 hr tablet, Take 1 tablet (750 mg total) by mouth daily with breakfast. .  predniSONE (DELTASONE) 10 MG tablet, Take 1 tablet (10 mg total) by mouth daily with breakfast.  Current Outpatient Medications (Cardiovascular):  .  losartan (COZAAR) 25 MG tablet, Take 0.5 tablets (12.5 mg total) by mouth daily. Marland Kitchen  spironolactone (ALDACTONE) 25 MG tablet, Take 0.5 tablets (12.5 mg total) by mouth daily. Marland Kitchen  torsemide (DEMADEX) 10 MG tablet, Take 1 tablet (10 mg total) by mouth daily. Marland Kitchen  torsemide (DEMADEX) 20 MG tablet, Take by mouth.  Current Outpatient Medications (Respiratory):  .  albuterol (PROVENTIL) (2.5 MG/3ML) 0.083% nebulizer solution, Take 3 mLs (2.5 mg total) by nebulization every 6 (six) hours as needed for wheezing or shortness of breath. Marland Kitchen  albuterol (VENTOLIN HFA) 108 (90 Base) MCG/ACT inhaler, Inhale 2 puffs into the lungs every 6 (six) hours as needed for wheezing. .  Budeson-Glycopyrrol-Formoterol (BREZTRI AEROSPHERE) 160-9-4.8 MCG/ACT AERO, Inhale 2 puffs into the lungs in the morning and at bedtime. .  mometasone (NASONEX) 50 MCG/ACT nasal spray, Place 2 sprays into the nose daily. .  roflumilast (DALIRESP) 500 MCG TABS tablet, Take 1 tablet (500 mcg total) by mouth daily. .  Tiotropium Bromide Monohydrate (SPIRIVA RESPIMAT) 2.5 MCG/ACT AERS, Inhale 2.5 mcg into the lungs 2 (two) times daily. Marland Kitchen  albuterol (VENTOLIN HFA) 108 (90 Base) MCG/ACT inhaler, Inhale into the lungs. .  mometasone-formoterol (DULERA) 200-5 MCG/ACT AERO, Dulera 200 mcg-5 mcg/actuation HFA aerosol inhaler  INHALE 2 PUFFS BY MOUTH FIRST THING IN THE MORNING AND THEN ANOTHER 2 PUFFS ABOUT 12 HOURS LATER .  montelukast (SINGULAIR) 10 MG tablet, Take 1 tablet (10 mg total) by mouth at bedtime. .  Tiotropium Bromide Monohydrate  (SPIRIVA RESPIMAT) 2.5 MCG/ACT AERS, Inhale 2.5 mcg into the lungs 2 (two) times daily.  Current Outpatient Medications (Analgesics):  .  allopurinol (ZYLOPRIM) 300 MG tablet, Take 1 tablet (300 mg total) by mouth daily.   Current Outpatient Medications (Other):  Marland Kitchen  ALPRAZolam (XANAX) 0.25 MG tablet, Take 1 tablet (0.25 mg total) by mouth 2 (two) times daily as needed for anxiety. .  cholecalciferol (VITAMIN D) 1000 units tablet, Take 1,000 Units by mouth daily. .  nicotine (NICODERM CQ - DOSED IN MG/24 HOURS) 21 mg/24hr patch, Place 1 patch (21 mg total) onto the skin daily. Marland Kitchen  omeprazole (PRILOSEC) 20 MG capsule, Take 1 capsule (20 mg total) by mouth 2 (two) times daily before a meal. (Patient taking differently: Take 20 mg by mouth daily.) .  polyethylene glycol (MIRALAX / GLYCOLAX) 17 g packet, Take 17 g by mouth daily as needed. Marland Kitchen  alprazolam (XANAX) 2 MG tablet, Take by mouth. Marland Kitchen  omeprazole (PRILOSEC OTC) 20 MG tablet,  .  polyethylene glycol powder (GLYCOLAX/MIRALAX) 17 GM/SCOOP powder, polyethylene glycol 3350 17 gram oral powder packet  MIX ONE PACKET IN 8 OUNCES OF LIQUID AND DRINK BY MOUTH DAILY AS NEEDED

## 2021-02-08 NOTE — Assessment & Plan Note (Signed)
Mild to mod, already improving, and will recommend conservative management without specific tx for now,  to f/u any worsening symptoms or concerns

## 2021-02-08 NOTE — Assessment & Plan Note (Signed)
Stable, cont current med tx - ventolin, dulera.,  to f/u any worsening symptoms or concerns

## 2021-02-11 ENCOUNTER — Encounter: Payer: Self-pay | Admitting: Emergency Medicine

## 2021-02-11 ENCOUNTER — Ambulatory Visit (INDEPENDENT_AMBULATORY_CARE_PROVIDER_SITE_OTHER): Payer: 59 | Admitting: Emergency Medicine

## 2021-02-11 ENCOUNTER — Other Ambulatory Visit: Payer: Self-pay

## 2021-02-11 DIAGNOSIS — J449 Chronic obstructive pulmonary disease, unspecified: Secondary | ICD-10-CM

## 2021-02-11 DIAGNOSIS — F17210 Nicotine dependence, cigarettes, uncomplicated: Secondary | ICD-10-CM

## 2021-02-11 MED ORDER — DULERA 200-5 MCG/ACT IN AERO: INHALATION_SPRAY | RESPIRATORY_TRACT | 2 refills | Status: DC

## 2021-02-11 NOTE — Progress Notes (Signed)
Subjective:  Patient ID: Robert Church, male    DOB: 10/10/1956  Age: 65 y.o. MRN: [id number]  CC:  Chief Complaint  Patient presents with  . Follow-up    Episodes of SOB     HPI  ROV 12/05/20 --[name] is 39, has a history of tobacco use, active smoker, with severe COPD and a chronic bronchitic phenotype with purulent sputum.  He is on daily maintenance prednisone, Spiriva, Dulera, Singulair, daliresp. Albuterol 3-4x a day.  I increased his prednisone dose to 10 mg daily at our most recent phone visit 10/2020. Would also plan to perform a walking oximetry to determine Also with a history of rhinitis, uses Astelin/fluticasone nasal spray, Mucinex. He continues to smoke 3-4 cig a day.  He has been experiencing persistent cough, mucous production, persistent exertional SOB.  He is having a gout flare, is having lower extremity edema and pain following a recent fall.  He does not believe that he can walk today to undergo oximetry.  I do believe that he may qualify whenever we can get a walking oximetry done.  ROV 02/11/21 --follow-up visit 65 year old man active smoker with severe COPD and a chronic bronchitic phenotype.  Is on maintenance prednisone 10 mg daily.  I tried changing his Dulera/Spiriva to [place] in December, continued Daliresp, Singulair, Mucinex.  He is now back on on Spiriva, not Dulera. Albuterol use >> 4-5 x a day. He is off mucinex.  He was hospitalized in late December for acute CHF exacerbation. He is doing better with sodium. Remains SOB w any exertion. Has cough w clear mucous. Sinus congestion. He was started on BiPAP at hospital, is wearing reliably every night. He has O2, uses it prn but not w all exertion.  Treated with Levaquin in late January for possible bronchitis Quit smoking in early January!! He is using some xanax and nicotine patches./   MDM: Reviewed hospitalization notes from 12/17/2020 Reviewed cardiology notes from 01/07/2021   ROS Review of  Systems  Constitutional: Negative for activity change, fatigue, fever and unexpected weight change.  HENT: Negative for congestion, postnasal drip, rhinorrhea and sinus pain.   Eyes: Negative for redness.  Respiratory: Positive for cough, shortness of breath and wheezing. Negative for apnea, choking, chest tightness and stridor.   Cardiovascular: Negative for chest pain, palpitations and leg swelling.  Gastrointestinal: Negative for abdominal distention, abdominal pain, diarrhea and nausea.  Endocrine: Negative for polyuria.  Genitourinary: Negative for dysuria and hematuria.  Musculoskeletal: Negative for back pain, gait problem and myalgias.  Skin: Negative for rash and wound.  Neurological: Negative for dizziness, seizures, weakness and headaches.  Psychiatric/Behavioral: Negative for agitation and self-injury.    Objective:   Today's Vitals: BP 118/72 (BP Location: Right Arm, Cuff Size: Normal)   Pulse 85   Temp (!) 97.3 F (36.3 C) (Temporal)   Ht 5\' 10"  (1.778 m)   Wt 221 lb 9.6 oz (100.5 kg)   SpO2 95%   BMI 31.80 kg/m   Gen: Pleasant, well-nourished, in no distress,  normal affect  ENT: No lesions,  mouth clear,  oropharynx clear, no postnasal drip  Neck: No JVD, no stridor  Lungs: No use of accessory muscles, no crackles, B exp wheezes.   Cardiovascular: RRR, heart sounds normal, no murmur or gallops, no peripheral edema  Musculoskeletal: No deformities, no cyanosis or clubbing  Neuro: alert, awake, non focal  Skin: Warm, no lesions or rash   Assessment & Plan:  COPD, group D,  by GOLD 2017 classification [facility]) Hospitalized for respiratory failure in setting of exacerbation since last visit.  He was discharged on BiPAP and is benefiting, tolerating.  Plan to continue same.  He did not feel that he benefited from [place].  He is currently on Spiriva, [name] was not restarted but we will plan to do so now.  He remains on prednisone 10 mg daily, Daliresp, Singulair,  not currently on Mucinex  Continue Spiriva as you have been taking it. We will restart Dulera 2 puffs twice a day.  Rinse and gargle after you use it. You can use your albuterol either 1 nebulizer treatment or 2 puffs up to every 4 hours if needed for shortness of breath, chest tightness, wheezing. Continue your prednisone 10 mg daily. Continue your Singulair and your Daliresp as you have been taking them. Continue your BiPAP every night as you have been using it. Continue oxygen with exertion as you have been using it. Follow [name] in 2 months so that we can discuss how you are doing after the addition of Dulera.  Cigarette smoker Reports that he quit smoking after his discharge from his recent hospitalization.  Congratulated him for this and supported him in his cessation.    Baltazar Apo, MD, PhD 02/12/2021, 9:32 AM [facility] [phone] or if no answer [phone]

## 2021-02-11 NOTE — Patient Instructions (Signed)
Continue Spiriva as you have been taking it. We will restart Dulera 2 puffs twice a day.  Rinse and gargle after you use it. You can use your albuterol either 1 nebulizer treatment or 2 puffs up to every 4 hours if needed for shortness of breath, chest tightness, wheezing. Continue your prednisone 10 mg daily. Continue your Singulair and your Daliresp as you have been taking them. Congratulations on stopping smoking!! Continue your BiPAP every night as you have been using it. Continue oxygen with exertion as you have been using it. Follow [name] in 2 months so that we can discuss how you are doing after the addition of Dulera.

## 2021-02-12 ENCOUNTER — Encounter: Payer: Self-pay | Admitting: Emergency Medicine

## 2021-02-12 ENCOUNTER — Telehealth: Payer: Self-pay

## 2021-02-12 NOTE — Assessment & Plan Note (Signed)
Reports that he quit smoking after his discharge from his recent hospitalization.  Congratulated him for this and supported him in his cessation.

## 2021-02-12 NOTE — Telephone Encounter (Signed)
Ok to hold on [place]

## 2021-02-12 NOTE — Assessment & Plan Note (Signed)
Hospitalized for respiratory failure in setting of exacerbation since last visit.  He was discharged on BiPAP and is benefiting, tolerating.  Plan to continue same.  He did not feel that he benefited from [place].  He is currently on Spiriva, [name] was not restarted but we will plan to do so now.  He remains on prednisone 10 mg daily, Daliresp, Singulair, not currently on Mucinex  Continue Spiriva as you have been taking it. We will restart Dulera 2 puffs twice a day.  Rinse and gargle after you use it. You can use your albuterol either 1 nebulizer treatment or 2 puffs up to every 4 hours if needed for shortness of breath, chest tightness, wheezing. Continue your prednisone 10 mg daily. Continue your Singulair and your Daliresp as you have been taking them. Continue your BiPAP every night as you have been using it. Continue oxygen with exertion as you have been using it. Follow [name] in 2 months so that we can discuss how you are doing after the addition of Dulera.

## 2021-02-12 NOTE — Telephone Encounter (Signed)
Received faxed request from [facility] that PA would be needed.  Per Dr Jenny Reichmann, ok to start PA.  Upon review of chart, pt was seen by pulmonologist on 02/11/21 who prescribed Mometasone-Formoterol.  Will clarify with Dr Jenny Reichmann if PA still needed.

## 2021-02-17 ENCOUNTER — Telehealth: Payer: Self-pay | Admitting: Emergency Medicine

## 2021-02-17 DIAGNOSIS — Z87891 Personal history of nicotine dependence: Secondary | ICD-10-CM

## 2021-02-17 DIAGNOSIS — F17210 Nicotine dependence, cigarettes, uncomplicated: Secondary | ICD-10-CM

## 2021-02-17 NOTE — Telephone Encounter (Signed)
Spoke with pt regarding lung cancer screening. Per Care Everywhere pt had a shared decision making visit through [facility] on 03/15/17. I have placed order for to have low dose Ct after 03/13/21. Pt is aware that Rodena Piety will call him to set this up. Pt verbalized understanding.

## 2021-02-17 NOTE — Telephone Encounter (Signed)
  Thanks so much [place]!! Langley Gauss, can we get this patient scheduled. We can look for the shared decision making visit in care everywhere. I we cannot see it, we can schedule for [facility] first. Thanks so much

## 2021-02-17 NOTE — Telephone Encounter (Signed)
02/17/2021  Called and spoke with patient.  Previously established with [facility] for pulmonary.  Now established with [name].  Previous lung cancer screening with [facility] visible in care everywhere.  Will place order for lung cancer screening referral to be processed here in the [facility] health system.  We will CC Langley Gauss as well as Judson Roch as Juluis Rainier.  Explained to patient that if appropriate documentation is not present in care everywhere for shared decision making visit patient may require a shared decision making visit with our practice.  Patient is aware.  Patient is due for lung cancer screening CT in March/2022.  Wyn Quaker, FNP

## 2021-02-27 ENCOUNTER — Other Ambulatory Visit: Payer: Self-pay | Admitting: Emergency Medicine

## 2021-03-04 ENCOUNTER — Ambulatory Visit (HOSPITAL_COMMUNITY)
Admission: RE | Admit: 2021-03-04 | Discharge: 2021-03-04 | Disposition: A | Discharge status: Home / Self Care | Payer: 59 | Source: Ambulatory Visit | Attending: Cardiology | Admitting: Cardiology

## 2021-03-04 ENCOUNTER — Encounter (HOSPITAL_COMMUNITY): Payer: Self-pay

## 2021-03-04 ENCOUNTER — Other Ambulatory Visit: Payer: Self-pay

## 2021-03-04 VITALS — BP 130/72 | HR 76 | Wt 223.8 lb

## 2021-03-04 DIAGNOSIS — Z87891 Personal history of nicotine dependence: Secondary | ICD-10-CM | POA: Insufficient documentation

## 2021-03-04 DIAGNOSIS — R918 Other nonspecific abnormal finding of lung field: Secondary | ICD-10-CM | POA: Diagnosis not present

## 2021-03-04 DIAGNOSIS — J449 Chronic obstructive pulmonary disease, unspecified: Secondary | ICD-10-CM | POA: Diagnosis not present

## 2021-03-04 DIAGNOSIS — Z6832 Body mass index (BMI) 32.0-32.9, adult: Secondary | ICD-10-CM | POA: Insufficient documentation

## 2021-03-04 DIAGNOSIS — I5042 Chronic combined systolic (congestive) and diastolic (congestive) heart failure: Secondary | ICD-10-CM | POA: Diagnosis present

## 2021-03-04 DIAGNOSIS — Z7951 Long term (current) use of inhaled steroids: Secondary | ICD-10-CM | POA: Insufficient documentation

## 2021-03-04 DIAGNOSIS — Z7952 Long term (current) use of systemic steroids: Secondary | ICD-10-CM | POA: Insufficient documentation

## 2021-03-04 DIAGNOSIS — I110 Hypertensive heart disease with heart failure: Secondary | ICD-10-CM | POA: Diagnosis not present

## 2021-03-04 DIAGNOSIS — G4733 Obstructive sleep apnea (adult) (pediatric): Secondary | ICD-10-CM | POA: Diagnosis not present

## 2021-03-04 DIAGNOSIS — Z79899 Other long term (current) drug therapy: Secondary | ICD-10-CM | POA: Diagnosis not present

## 2021-03-04 DIAGNOSIS — I428 Other cardiomyopathies: Secondary | ICD-10-CM | POA: Diagnosis not present

## 2021-03-04 DIAGNOSIS — I5023 Acute on chronic systolic (congestive) heart failure: Secondary | ICD-10-CM | POA: Diagnosis not present

## 2021-03-04 LAB — BASIC METABOLIC PANEL
Anion gap: 8 (ref 5–15)
BUN: 17 mg/dL (ref 8–23)
CO2: 27 mmol/L (ref 22–32)
Calcium: 9.2 mg/dL (ref 8.9–10.3)
Chloride: 104 mmol/L (ref 98–111)
Creatinine, Ser: 1.01 mg/dL (ref 0.61–1.24)
GFR, Estimated: >60 mL/min (ref >60)
Glucose, Bld: 137 mg/dL — ABNORMAL HIGH (ref 70–99)
Potassium: 3.8 mmol/L (ref 3.5–5.1)
Sodium: 139 mmol/L (ref 135–145)

## 2021-03-04 LAB — HEMOGLOBIN A1C
Hgb A1c MFr Bld: 6.5 % — ABNORMAL HIGH (ref 4.8–5.6)
Mean Plasma Glucose: 139.85 mg/dL

## 2021-03-04 MED ORDER — EMPAGLIFLOZIN 10 MG PO TABS: 10.0000 mg | ORAL_TABLET | Freq: Every day | ORAL | 11 refills | Status: DC

## 2021-03-04 MED ORDER — SPIRONOLACTONE 25 MG PO TABS: 25.0000 mg | ORAL_TABLET | Freq: Every day | ORAL | 3 refills | Status: DC

## 2021-03-04 NOTE — Progress Notes (Signed)
ADVANCED HF CLINIC NOTE  MD: Dr. Haroldine Laws  Reason for Visit: [facility] F/u for Systolic Heart Failure  HPI:  Robert Church is a 65 y/o male with h/o obesity, HTN, COPD with ongoing tobacco use and chronic systolic HF referred by Dr. Gwenlyn Found for further management of his HF.   He underwent cath in 4/18 due to CP. Cath showed normal coronaries with EF 40% and global HK. LVEDP 19. He was referred for a sleep study.  He saw Dr. Gwenlyn Found on 07/01/2018 because of recurrent chest pain.  Myoview stress test performed 07/21/2018 showed EF 38% inferior scar with septal ischemia.  A 2D echo performed 07/07/2018 revealed an EF of 30 to 35%.  He was referred for cardiac CT.   Cardiac CT on 08/26/18.  Calcium score 148 (74th percentile)  LAD 25% otherwise normal cors. Dilated pulmonary arteries suggestive of PH.   Works for [employer]. Follows with [name] in Pulmonary Clinic. Was started on bisoprolol 2.5 but couldn't tolerate due to fatigue. Was falling asleep at work. Stopped bisoprolol and felt better.   Echo 12/20 EF improved to 45%. Mild RV dysfunction   Recently admitted 1/22 for acute on chronic hypoxic respiratory failure secondary to acute COPDE and a/c CHF w/ volume overload. He had massive diuresis w/ IV Lasix, diuresed down from admit wt of 265 lb to 210 lb. Echo was repeated 12/21, EF 40-45% (unchanged). RV mildly reduced, right ventricular systolic pressure was 54.6 mmHg. I evaluated him for post hospital f/u and his volume status was stable. ReDs clip was 33%. BP was soft but stable. No med changes were made.   He presents to clinic today for f/u.  Reports recent 7 lb wt gain and increased DOE. Also bendopnea. ReDs Clip elevated at 46%. Reports full med compliance and tries to avoid high salt foods but admits to high fluid intake. BP 130/70.   Past Medical History:  Diagnosis Date  . Allergy   . Asthma   . Bronchitis   . CHF (congestive heart failure) ([place])   . COPD (chronic  obstructive pulmonary disease) ([place])   . GERD (gastroesophageal reflux disease)   . Hypertension   . Pneumonia   . Sinusitis   . SOB (shortness of breath)     Current Outpatient Medications  Medication Sig Dispense Refill  . albuterol (PROVENTIL) (2.5 MG/3ML) 0.083% nebulizer solution Take 3 mLs (2.5 mg total) by nebulization every 6 (six) hours as needed for wheezing or shortness of breath. 75 mL 12  . albuterol (VENTOLIN HFA) 108 (90 Base) MCG/ACT inhaler INHALE 2 PUFFS INTO THE LUNGS EVERY 6 HOURS AS NEEDED FOR WHEEZING 18 g 2  . allopurinol (ZYLOPRIM) 300 MG tablet Take 1 tablet (300 mg total) by mouth daily. 90 tablet 1  . ALPRAZolam (XANAX) 0.25 MG tablet Take 1 tablet (0.25 mg total) by mouth 2 (two) times daily as needed for anxiety. 60 tablet 1  . cholecalciferol (VITAMIN D) 1000 units tablet Take 1,000 Units by mouth daily.    Marland Kitchen losartan (COZAAR) 25 MG tablet Take 0.5 tablets (12.5 mg total) by mouth daily. 45 tablet 1  . metFORMIN (GLUCOPHAGE XR) 750 MG 24 hr tablet Take 1 tablet (750 mg total) by mouth daily with breakfast. 90 tablet 1  . mometasone (NASONEX) 50 MCG/ACT nasal spray Place 2 sprays into the nose daily. 17 g 5  . mometasone-formoterol (DULERA) 200-5 MCG/ACT AERO Dulera 200 mcg-5 mcg/actuation HFA aerosol inhaler  INHALE 2 PUFFS  BY MOUTH FIRST THING IN THE MORNING AND THEN ANOTHER 2 PUFFS ABOUT 12 HOURS LATER 1 each 2  . montelukast (SINGULAIR) 10 MG tablet Take 1 tablet (10 mg total) by mouth at bedtime. 30 tablet 5  . nicotine (NICODERM CQ - DOSED IN MG/24 HOURS) 21 mg/24hr patch Place 1 patch (21 mg total) onto the skin daily. 28 patch 0  . omeprazole (PRILOSEC) 20 MG capsule Take 1 capsule (20 mg total) by mouth 2 (two) times daily before a meal. 180 capsule 1  . polyethylene glycol (MIRALAX / GLYCOLAX) 17 g packet Take 17 g by mouth daily as needed. 14 each 0  . predniSONE (DELTASONE) 10 MG tablet Take 1 tablet (10 mg total) by mouth daily with breakfast. 30  tablet 2  . roflumilast (DALIRESP) 500 MCG TABS tablet Take 1 tablet (500 mcg total) by mouth daily. 30 tablet 3  . spironolactone (ALDACTONE) 25 MG tablet Take 0.5 tablets (12.5 mg total) by mouth daily. 60 tablet 2  . Tiotropium Bromide Monohydrate (SPIRIVA RESPIMAT) 2.5 MCG/ACT AERS Inhale 2.5 mcg into the lungs 2 (two) times daily. 4 g 11  . torsemide (DEMADEX) 10 MG tablet Take 1 tablet (10 mg total) by mouth daily. 45 tablet 1   No current facility-administered medications for this encounter.    Allergies  Allergen Reactions  . Clindamycin Other (See Comments)    Tongue swelling  . Doxycycline     Severe stomach upset per patient  . E-Mycin [Erythromycin Base] Swelling  . Penicillins Swelling and Other (See Comments)    Childhood rxn--MD stated he "almost died" Has patient had a PCN reaction causing immediate rash, facial/tongue/throat swelling, SOB or lightheadedness with hypotension:Yes Has patient had a PCN reaction causing severe rash involving mucus membranes or skin necrosis:unsure Has patient had a PCN reaction that required hospitalization:unsure Has patient had a PCN reaction occurring within the last 10 years:No If all of the above answers are "NO", then may proceed with Cephalosporin use.    . Rosuvastatin     Other reaction(s): Myalgias (intolerance)      Social History   Socioeconomic History  . Marital status: Single    Spouse name: Not on file  . Number of children: Not on file  . Years of education: Not on file  . Highest education level: Not on file  Occupational History  . Occupation: Dealer  Tobacco Use  . Smoking status: Former Smoker    Packs/day: 0.25    Years: 46.00    Pack years: 11.50    Types: Cigarettes    Quit date: 01/06/2021    Years since quitting: 0.1  . Smokeless tobacco: Never Used  Vaping Use  . Vaping Use: Never used  Substance and Sexual Activity  . Alcohol use: Yes    Alcohol/week: 0.0 standard drinks  . Drug use: No   . Sexual activity: Yes    Birth control/protection: None  Other Topics Concern  . Not on file  Social History Narrative  . Not on file   Social Determinants of Health   Financial Resource Strain: Not on file  Food Insecurity: Not on file  Transportation Needs: Not on file  Physical Activity: Not on file  Stress: Not on file  Social Connections: Not on file  Intimate Partner Violence: Not on file      Family History  Problem Relation Age of Onset  . Lung cancer Father   . Brain cancer Father     Vitals:  03/04/21 1115  BP: 130/72  Pulse: 76  SpO2: 93%  Weight: 101.5 kg    PHYSICAL EXAM: ReDs Clip 46% General:  Well appearing, moderately obese. No respiratory difficulty HEENT: normal Neck: supple.  JVD elevated ~10 cm. Carotids 2+ bilat; no bruits. No lymphadenopathy or thyromegaly appreciated. Cor: PMI nondisplaced. Regular rate & rhythm. No rubs, gallops or murmurs. Lungs: clear Abdomen: soft, nontender, nondistended. No hepatosplenomegaly. No bruits or masses. Good bowel sounds. Extremities: no cyanosis, clubbing, rash, trace bilateral LE edema, chronic bilateral venous stasis dermatities Neuro: alert & oriented x 3, cranial nerves grossly intact. moves all 4 extremities w/o difficulty. Affect pleasant.    ASSESSMENT & PLAN:  1. Acute on Chronic systolic HF due to NICM  - cath 4/18 normal cors. EF 48%  - Myoview 07/21/2018 showed EF 38% inferior scar with septal ischemia.   - Echo 07/07/2018 revealed EF 30-35%. RV normal.  - Cardiac CT 08/26/18.  Calcium score 148 (74th percentile)  LAD 25% otherwise normal cors. Dilated pulmonary arteries suggestive of PH.  - Suspect NICM due to HTN and inadequately treated OSA (He now reports improved nightly compliance w/ CPAP). - Echo 12/20 EF 45% mild RV dysfunction - Recent admit for a/c CHF 1/21, EF unchanged 40-45%. RV mildly reduced. RVSP 49 - Chronically NYHA Class II-III, confounded by COPD and obesity  - Fluid  overloaded on exam and by ReDs Clip 46% - Increase Spiro to 25 mg daily  - Add Jardiance 10 mg daily  - Continue torsemide 10 mg daily  - Continue Losartan 12.5 mg daily (failed Entresto due to dizziness/low BP)  - Could not tolerate bisoprolol due to fatigue.  - Check BMP today and again in 7 days - Advised fluid restriction < 2L/day + low sodium diet   2. HTN - well controlled on current regimen - check BMP today   3. COPD  -  has quit smoking, congratulated on efforts  -  Follows with Pulmonary ([name]) - home O2 PRN - continue BiPAP   4. OSA - reports nightly compliance w/ BiPAP  5. Morbid obesity  Body mass index is 32.11 kg/m. - wt loss advised - long discussion regarding low sodium/ low carb, heart healthy diet   6. Pulmonary Nodules - followed by [name] - scheduled for CT scan next week   F/u w/ APP in 2-3 weeks to reassess fluid status   Robert Vankuren, PA-C  11:27 AM

## 2021-03-04 NOTE — Patient Instructions (Signed)
Labs done today. We will contact you only if your labs are abnormal.  INCREASE Spironolactone to 25mg  (1 tablet) by mouth daily.   START Jardiance 10mg  (1 tablet) by mouth daily.   No other medication changes were made. Please continue all current medications as prescribed.  Your physician recommends that you schedule a follow-up appointment in: 7-10 days for a lab only appointment and in 2-3 weeks for an appointment here in our office.    If you have any questions or concerns before your next appointment please send [place] a message through [place] or call our office at [phone].    TO LEAVE A MESSAGE FOR THE NURSE SELECT OPTION 2, PLEASE LEAVE A MESSAGE INCLUDING: . YOUR NAME . DATE OF BIRTH . CALL BACK NUMBER . REASON FOR CALL**this is important as we prioritize the call backs  YOU WILL RECEIVE A CALL BACK THE SAME DAY AS LONG AS YOU CALL BEFORE 4:00 PM   Do the following things EVERYDAY: 1) Weigh yourself in the morning before breakfast. Write it down and keep it in a log. 2) Take your medicines as prescribed 3) Eat low salt foods--Limit salt (sodium) to 2000 mg per day.  4) Stay as active as you can everyday 5) Limit all fluids for the day to less than 2 liters   At the [facility], you and your health needs are our priority. As part of our continuing mission to provide you with exceptional heart care, we have created designated Provider Care Teams. These Care Teams include your primary Cardiologist (physician) and Advanced Practice Providers (APPs- Physician Assistants and Nurse Practitioners) who all work together to provide you with the care you need, when you need it.   You may see any of the following providers on your designated Care Team at your next follow up: Marland Kitchen Dr Glori Bickers . Dr Loralie Champagne . Darrick Grinder, NP . Lyda Jester, PA . Audry Riles, PharmD   Please be sure to bring in all your medications bottles to every appointment.

## 2021-03-08 ENCOUNTER — Telehealth: Payer: Self-pay | Admitting: Pulmonary Disease

## 2021-03-08 MED ORDER — ROFLUMILAST 500 MCG PO TABS: 500.0000 ug | ORAL_TABLET | Freq: Every day | ORAL | 3 refills | Status: DC

## 2021-03-08 NOTE — Telephone Encounter (Signed)
Called for daliresp refill Sent order to [facility] on [address]

## 2021-03-11 ENCOUNTER — Ambulatory Visit (HOSPITAL_COMMUNITY)
Admission: RE | Admit: 2021-03-11 | Discharge: 2021-03-11 | Disposition: A | Discharge status: Home / Self Care | Payer: 59 | Source: Ambulatory Visit | Attending: Cardiology | Admitting: Cardiology

## 2021-03-11 ENCOUNTER — Other Ambulatory Visit: Payer: Self-pay

## 2021-03-11 ENCOUNTER — Other Ambulatory Visit (HOSPITAL_COMMUNITY): Payer: Self-pay | Admitting: *Deleted

## 2021-03-11 DIAGNOSIS — I5042 Chronic combined systolic (congestive) and diastolic (congestive) heart failure: Secondary | ICD-10-CM

## 2021-03-11 LAB — BASIC METABOLIC PANEL
Anion gap: 7 (ref 5–15)
BUN: 18 mg/dL (ref 8–23)
CO2: 29 mmol/L (ref 22–32)
Calcium: 9.4 mg/dL (ref 8.9–10.3)
Chloride: 103 mmol/L (ref 98–111)
Creatinine, Ser: 1 mg/dL (ref 0.61–1.24)
GFR, Estimated: >60 mL/min (ref >60)
Glucose, Bld: 106 mg/dL — ABNORMAL HIGH (ref 70–99)
Potassium: 4.2 mmol/L (ref 3.5–5.1)
Sodium: 139 mmol/L (ref 135–145)

## 2021-03-14 ENCOUNTER — Ambulatory Visit
Admission: RE | Admit: 2021-03-14 | Discharge: 2021-03-14 | Disposition: A | Discharge status: Home / Self Care | Payer: 59 | Source: Ambulatory Visit | Attending: Acute Care | Admitting: Acute Care

## 2021-03-14 ENCOUNTER — Other Ambulatory Visit: Payer: Self-pay

## 2021-03-14 DIAGNOSIS — F17210 Nicotine dependence, cigarettes, uncomplicated: Secondary | ICD-10-CM

## 2021-03-14 DIAGNOSIS — Z87891 Personal history of nicotine dependence: Secondary | ICD-10-CM

## 2021-03-17 ENCOUNTER — Encounter: Payer: Self-pay | Admitting: *Deleted

## 2021-03-21 ENCOUNTER — Telehealth (HOSPITAL_COMMUNITY): Payer: Self-pay | Admitting: *Deleted

## 2021-03-21 NOTE — Telephone Encounter (Signed)
Pt spoke with Benjamine Mola Hines,CPht. Pt started farxiga 2 weeks ago and also increased Spironolactone, one half tab (12.5mg ) to a whole tab(25mg ) and 3-5 days later he started having constant headaches and dizziness.   Please advise

## 2021-03-24 ENCOUNTER — Telehealth: Payer: Self-pay | Admitting: Acute Care

## 2021-03-24 DIAGNOSIS — F17210 Nicotine dependence, cigarettes, uncomplicated: Secondary | ICD-10-CM

## 2021-03-24 NOTE — Telephone Encounter (Signed)
Pt informed of CT results per Sarah Groce, NP.  PT verbalized understanding.  Copy sent to PCP.  Order placed for 1 yr f/u CT.  

## 2021-03-25 ENCOUNTER — Encounter: Payer: Self-pay | Admitting: Internal Medicine

## 2021-03-25 ENCOUNTER — Other Ambulatory Visit: Payer: Self-pay

## 2021-03-25 ENCOUNTER — Ambulatory Visit (INDEPENDENT_AMBULATORY_CARE_PROVIDER_SITE_OTHER): Payer: 59 | Admitting: Internal Medicine

## 2021-03-25 VITALS — BP 126/80 | HR 70 | Temp 98.2°F | Resp 16 | Ht 70.0 in | Wt 222.0 lb

## 2021-03-25 DIAGNOSIS — E781 Pure hyperglyceridemia: Secondary | ICD-10-CM

## 2021-03-25 DIAGNOSIS — I10 Essential (primary) hypertension: Secondary | ICD-10-CM

## 2021-03-25 DIAGNOSIS — J431 Panlobular emphysema: Secondary | ICD-10-CM

## 2021-03-25 DIAGNOSIS — E118 Type 2 diabetes mellitus with unspecified complications: Secondary | ICD-10-CM

## 2021-03-25 DIAGNOSIS — F17210 Nicotine dependence, cigarettes, uncomplicated: Secondary | ICD-10-CM

## 2021-03-25 DIAGNOSIS — D696 Thrombocytopenia, unspecified: Secondary | ICD-10-CM | POA: Diagnosis not present

## 2021-03-25 DIAGNOSIS — E785 Hyperlipidemia, unspecified: Secondary | ICD-10-CM

## 2021-03-25 DIAGNOSIS — I700 Atherosclerosis of aorta: Secondary | ICD-10-CM | POA: Diagnosis not present

## 2021-03-25 DIAGNOSIS — R7989 Other specified abnormal findings of blood chemistry: Secondary | ICD-10-CM

## 2021-03-25 DIAGNOSIS — I5042 Chronic combined systolic (congestive) and diastolic (congestive) heart failure: Secondary | ICD-10-CM

## 2021-03-25 LAB — HEPATIC FUNCTION PANEL
ALT: 17 U/L (ref 0–53)
AST: 19 U/L (ref 0–37)
Albumin: 4.2 g/dL (ref 3.5–5.2)
Alkaline Phosphatase: 52 U/L (ref 39–117)
Bilirubin, Direct: 0.1 mg/dL (ref 0.0–0.3)
Total Bilirubin: 0.4 mg/dL (ref 0.2–1.2)
Total Protein: 6.3 g/dL (ref 6.0–8.3)

## 2021-03-25 LAB — BASIC METABOLIC PANEL
BUN: 25 mg/dL — ABNORMAL HIGH (ref 6–23)
CO2: 28 mEq/L (ref 19–32)
Calcium: 9.4 mg/dL (ref 8.4–10.5)
Chloride: 103 mEq/L (ref 96–112)
Creatinine, Ser: 1.18 mg/dL (ref 0.40–1.50)
GFR: 66.09 mL/min (ref >60.00)
Glucose, Bld: 171 mg/dL — ABNORMAL HIGH (ref 70–99)
Potassium: 4.8 mEq/L (ref 3.5–5.1)
Sodium: 140 mEq/L (ref 135–145)

## 2021-03-25 LAB — LDL CHOLESTEROL, DIRECT: Direct LDL: 150 mg/dL

## 2021-03-25 LAB — CBC WITH DIFFERENTIAL/PLATELET
Basophils Absolute: 0 10*3/uL (ref 0.0–0.1)
Basophils Relative: 0.3 % (ref 0.0–3.0)
Eosinophils Absolute: 0 10*3/uL (ref 0.0–0.7)
Eosinophils Relative: 0.4 % (ref 0.0–5.0)
HCT: 46 % (ref 39.0–52.0)
Hemoglobin: 15.9 g/dL (ref 13.0–17.0)
Lymphocytes Relative: 8 % — ABNORMAL LOW (ref 12.0–46.0)
Lymphs Abs: 0.6 10*3/uL — ABNORMAL LOW (ref 0.7–4.0)
MCHC: 34.6 g/dL (ref 30.0–36.0)
MCV: 96.9 fl (ref 78.0–100.0)
Monocytes Absolute: 0.4 10*3/uL (ref 0.1–1.0)
Monocytes Relative: 5 % (ref 3.0–12.0)
Neutro Abs: 7 10*3/uL (ref 1.4–7.7)
Neutrophils Relative %: 86.3 % — ABNORMAL HIGH (ref 43.0–77.0)
Platelets: 190 10*3/uL (ref 150.0–400.0)
RBC: 4.74 Mil/uL (ref 4.22–5.81)
RDW: 14.6 % (ref 11.5–15.5)
WBC: 8.1 10*3/uL (ref 4.0–10.5)

## 2021-03-25 LAB — PROTIME-INR
INR: 0.9 ratio (ref 0.8–1.0)
Prothrombin Time: 10.5 s (ref 9.6–13.1)

## 2021-03-25 LAB — LIPID PANEL
Cholesterol: 209 mg/dL — ABNORMAL HIGH (ref 0–200)
HDL: 45.2 mg/dL (ref >39.00)
NonHDL: 163.4
Total CHOL/HDL Ratio: 5
Triglycerides: 216 mg/dL — ABNORMAL HIGH (ref 0.0–149.0)
VLDL: 43.2 mg/dL — ABNORMAL HIGH (ref 0.0–40.0)

## 2021-03-25 LAB — HEMOGLOBIN A1C: Hgb A1c MFr Bld: 6.3 % (ref 4.6–6.5)

## 2021-03-25 LAB — FOLATE: Folate: 14.3 ng/mL (ref >5.9)

## 2021-03-25 LAB — VITAMIN B12: Vitamin B-12: 240 pg/mL (ref 211–911)

## 2021-03-25 MED ORDER — ATORVASTATIN CALCIUM 10 MG PO TABS: 10.0000 mg | ORAL_TABLET | Freq: Every day | ORAL | 1 refills | Status: DC

## 2021-03-25 MED ORDER — NICOTINE 21 MG/24HR TD PT24: 21.0000 mg | MEDICATED_PATCH | Freq: Every day | TRANSDERMAL | 0 refills | Status: DC

## 2021-03-25 MED ORDER — NICOTINE 14 MG/24HR TD PT24: 14.0000 mg | MEDICATED_PATCH | Freq: Every day | TRANSDERMAL | 0 refills | Status: DC

## 2021-03-25 NOTE — Progress Notes (Signed)
Subjective:  Patient ID: Robert Church, male    DOB: 06-03-1956  Age: 65 y.o. MRN: [id number]  CC: Diabetes, Hyperlipidemia, and Hypertension  This visit occurred during the SARS-CoV-2 public health emergency.  Safety protocols were in place, including screening questions prior to the visit, additional usage of staff PPE, and extensive cleaning of exam room while observing appropriate contact time as indicated for disinfecting solutions.    HPI [name] presents for f/up -he was recently admitted and treated for heart failure.  He was discharged on an SGLT2 inhibitor but he says he is not taking it yet.  He says he has mild shortness of breath but denies chest pain or diaphoresis.  He has chronic constipation, hard stools, rectal discomfort, and rectal bleeding.  He sees a gastroenterologist tomorrow.  He is trying to quit smoking and requests a prescription for nicotine patches.  He refuses vaccines against pneumonia, influenza, and Tdap today.  Outpatient Medications Prior to Visit  Medication Sig Dispense Refill  . albuterol (PROVENTIL) (2.5 MG/3ML) 0.083% nebulizer solution Take 3 mLs (2.5 mg total) by nebulization every 6 (six) hours as needed for wheezing or shortness of breath. 75 mL 12  . albuterol (VENTOLIN HFA) 108 (90 Base) MCG/ACT inhaler INHALE 2 PUFFS INTO THE LUNGS EVERY 6 HOURS AS NEEDED FOR WHEEZING 18 g 2  . allopurinol (ZYLOPRIM) 300 MG tablet Take 1 tablet (300 mg total) by mouth daily. 90 tablet 1  . ALPRAZolam (XANAX) 0.25 MG tablet Take 1 tablet (0.25 mg total) by mouth 2 (two) times daily as needed for anxiety. 60 tablet 1  . cholecalciferol (VITAMIN D) 1000 units tablet Take 1,000 Units by mouth daily.    Marland Kitchen losartan (COZAAR) 25 MG tablet Take 0.5 tablets (12.5 mg total) by mouth daily. 45 tablet 1  . metFORMIN (GLUCOPHAGE XR) 750 MG 24 hr tablet Take 1 tablet (750 mg total) by mouth daily with breakfast. 90 tablet 1  . mometasone (NASONEX) 50 MCG/ACT  nasal spray Place 2 sprays into the nose daily. 17 g 5  . mometasone-formoterol (DULERA) 200-5 MCG/ACT AERO Dulera 200 mcg-5 mcg/actuation HFA aerosol inhaler  INHALE 2 PUFFS BY MOUTH FIRST THING IN THE MORNING AND THEN ANOTHER 2 PUFFS ABOUT 12 HOURS LATER 1 each 2  . montelukast (SINGULAIR) 10 MG tablet Take 1 tablet (10 mg total) by mouth at bedtime. 30 tablet 5  . omeprazole (PRILOSEC) 20 MG capsule Take 1 capsule (20 mg total) by mouth 2 (two) times daily before a meal. 180 capsule 1  . polyethylene glycol (MIRALAX / GLYCOLAX) 17 g packet Take 17 g by mouth daily as needed. 14 each 0  . predniSONE (DELTASONE) 10 MG tablet Take 1 tablet (10 mg total) by mouth daily with breakfast. 30 tablet 2  . roflumilast (DALIRESP) 500 MCG TABS tablet Take 1 tablet (500 mcg total) by mouth daily. 30 tablet 3  . spironolactone (ALDACTONE) 25 MG tablet Take 1 tablet (25 mg total) by mouth daily. 90 tablet 3  . Tiotropium Bromide Monohydrate (SPIRIVA RESPIMAT) 2.5 MCG/ACT AERS Inhale 2.5 mcg into the lungs 2 (two) times daily. 4 g 11  . torsemide (DEMADEX) 10 MG tablet Take 1 tablet (10 mg total) by mouth daily. 45 tablet 1  . nicotine (NICODERM CQ - DOSED IN MG/24 HOURS) 21 mg/24hr patch Place 1 patch (21 mg total) onto the skin daily. 28 patch 0  . empagliflozin (JARDIANCE) 10 MG TABS tablet Take 1 tablet (10 mg total)  by mouth daily before breakfast. (Patient not taking: Reported on 03/25/2021) 30 tablet 11   No facility-administered medications prior to visit.    ROS Review of Systems  Constitutional: Negative for chills, diaphoresis, fatigue and fever.  HENT: Negative.   Eyes: Negative for visual disturbance.  Respiratory: Positive for shortness of breath. Negative for cough, chest tightness and wheezing.   Cardiovascular: Negative for chest pain, palpitations and leg swelling.  Gastrointestinal: Negative for abdominal pain, constipation, diarrhea, nausea and vomiting.  Endocrine: Negative.    Genitourinary: Negative.   Musculoskeletal: Negative for arthralgias and myalgias.  Skin: Negative.  Negative for color change.  Neurological: Negative.  Negative for dizziness and weakness.  Hematological: Negative for adenopathy. Does not bruise/bleed easily.  Psychiatric/Behavioral: Negative.     Objective:  BP 126/80   Pulse 70   Temp 98.2 F (36.8 C) (Oral)   Resp 16   Ht 5\' 10"  (1.778 m)   Wt 222 lb (100.7 kg)   SpO2 95%   BMI 31.85 kg/m   BP Readings from Last 3 Encounters:  03/25/21 126/80  03/04/21 130/72  02/11/21 118/72    Wt Readings from Last 3 Encounters:  03/25/21 222 lb (100.7 kg)  03/04/21 223 lb 12.8 oz (101.5 kg)  02/11/21 221 lb 9.6 oz (100.5 kg)    Physical Exam Vitals reviewed.  Constitutional:      General: He is not in acute distress.    Appearance: He is obese. He is not toxic-appearing or diaphoretic.  HENT:     Nose: Nose normal.     Mouth/Throat:     Mouth: Mucous membranes are moist.  Eyes:     General: No scleral icterus.    Conjunctiva/sclera: Conjunctivae normal.  Cardiovascular:     Rate and Rhythm: Normal rate and regular rhythm.     Heart sounds: No murmur heard.   Pulmonary:     Effort: Pulmonary effort is normal. No tachypnea or accessory muscle usage.     Breath sounds: Examination of the right-lower field reveals rhonchi. Examination of the left-lower field reveals rhonchi. Rhonchi present. No decreased breath sounds, wheezing or rales.  Abdominal:     General: Abdomen is protuberant. Bowel sounds are normal. There is no distension.     Palpations: Abdomen is soft.     Tenderness: There is no abdominal tenderness.  Musculoskeletal:        General: Normal range of motion.     Cervical back: Neck supple.     Right lower leg: No edema.     Left lower leg: No edema.  Lymphadenopathy:     Cervical: No cervical adenopathy.  Skin:    General: Skin is warm and dry.     Coloration: Skin is not pale.  Neurological:      General: No focal deficit present.     Mental Status: He is alert.  Psychiatric:        Mood and Affect: Mood normal.        Behavior: Behavior normal.     Lab Results  Component Value Date   WBC 8.1 03/25/2021   HGB 15.9 03/25/2021   HCT 46.0 03/25/2021   PLT 190.0 03/25/2021   GLUCOSE 171 (H) 03/25/2021   CHOL 209 (H) 03/25/2021   TRIG 216.0 (H) 03/25/2021   HDL 45.20 03/25/2021   LDLDIRECT 150.0 03/25/2021   LDLCALC 108 (H) 07/10/2020   ALT 17 03/25/2021   AST 19 03/25/2021   NA 140 03/25/2021   K  4.8 03/25/2021   CL 103 03/25/2021   CREATININE 1.18 03/25/2021   BUN 25 (H) 03/25/2021   CO2 28 03/25/2021   TSH 0.61 07/10/2020   PSA 1.7 07/10/2020   INR 0.9 03/25/2021   HGBA1C 6.3 03/25/2021    CT CHEST LUNG CA SCREEN LOW DOSE W/O CM  Result Date: 03/15/2021 CLINICAL DATA:  65 year old male current smoker, with 48 pack-year history of smoking, for initial lung cancer screening EXAM: CT CHEST WITHOUT CONTRAST LOW-DOSE FOR LUNG CANCER SCREENING TECHNIQUE: Multidetector CT imaging of the chest was performed following the standard protocol without IV contrast. COMPARISON:  Partial comparison to CT heart dated 08/26/2018 FINDINGS: Cardiovascular: Heart is top-normal in size. No pericardial effusion. No evidence of thoracic aortic aneurysm. Atherosclerotic calcifications of the aortic arch. Mild coronary atherosclerosis of the LAD and left circumflex. Mediastinum/Nodes: No suspicious mediastinal lymphadenopathy. Visualized thyroid is unremarkable. Lungs/Pleura: Mild centrilobular and paraseptal emphysematous changes, upper lung predominant. Mild scarring in the posterior left upper lobe. No focal consolidation. 4.7 mm perifissural nodule in the right middle lobe along the right minor fissure. 5.8 mm triangular subpleural nodule in the right lower lobe (image 238), unchanged from 2019. No pleural effusion or pneumothorax. Upper Abdomen: Visualized upper abdomen is grossly unremarkable,  noting vascular calcifications. Musculoskeletal: Mild degenerative changes of the visualized thoracolumbar spine. IMPRESSION: Lung-RADS 2, benign appearance or behavior. Continue annual screening with low-dose chest CT without contrast in 12 months. Aortic Atherosclerosis (ICD10-I70.0) and Emphysema (ICD10-J43.9). Electronically Signed   By: Julian Hy M.D.   On: 03/15/2021 11:36    Assessment & Plan:   Taquan was seen today for diabetes, hyperlipidemia and hypertension.  Diagnoses and all orders for this visit:  Type II diabetes mellitus with manifestations ([place])- His blood sugar is adequately well controlled. -     Basic metabolic panel; Future -     Hemoglobin A1c; Future -     Ambulatory referral to Ophthalmology -     Hemoglobin A1c -     Basic metabolic panel -     empagliflozin (JARDIANCE) 10 MG TABS tablet; Take 1 tablet (10 mg total) by mouth daily before breakfast.  Hyperlipidemia LDL goal <70- He has not achieved his LDL goal.  He previously did not tolerate rosuvastatin.  Will see if he can tolerate low-dose atorvastatin. -     Lipid panel; Future -     atorvastatin (LIPITOR) 10 MG tablet; Take 1 tablet (10 mg total) by mouth daily. -     Lipid panel  Hypertriglyceridemia- Improvement noted.  I have encouraged him to continue working on his lifestyle modifications. -     Lipid panel; Future -     Lipid panel  Elevated LFTs- His liver enzymes are normal now.  Screening for viral hepatitis is negative.  This was likely related to alcohol intake. I recommended that he get vaccinated against hepatitis A and B. -     Hepatic function panel; Future -     Protime-INR; Future -     Hepatitis A antibody, total; Future -     Hepatitis B core antibody, total; Future -     Hepatitis B surface antigen; Future -     Hepatitis B surface antibody,quantitative; Future -     Hepatitis B surface antibody,quantitative -     Hepatitis B surface antigen -     Hepatitis B core  antibody, total -     Hepatitis A antibody, total -     Protime-INR -  Hepatic function panel  Primary hypertension- His BP is adequately well controlled. -     Urinalysis, Routine w reflex microscopic; Future -     Urinalysis, Routine w reflex microscopic  Thrombocytopenia ([place])- His platelet count is normal now.  B12 and folate levels are normal. -     CBC with Differential/Platelet; Future -     Folate; Future -     Vitamin B12; Future -     Vitamin B12 -     Folate -     CBC with Differential/Platelet  Cigarette smoker -     nicotine (NICODERM CQ - DOSED IN MG/24 HOURS) 21 mg/24hr patch; Place 1 patch (21 mg total) onto the skin daily. -     nicotine (NICODERM CQ) 14 mg/24hr patch; Place 1 patch (14 mg total) onto the skin daily.  Panlobular emphysema ([place])- Will continue the triple inhaler combination.  Atherosclerosis of aorta ([place])- Risk factor modifications addressed.  Chronic combined systolic and diastolic CHF (congestive heart failure) ([place])- I have asked him to take an SGLT-2 inh for CV risk reduction. -     empagliflozin (JARDIANCE) 10 MG TABS tablet; Take 1 tablet (10 mg total) by mouth daily before breakfast.  Other orders -     LDL cholesterol, direct   I am having [name] start on nicotine and atorvastatin. I am also having him maintain his cholecalciferol, omeprazole, albuterol, allopurinol, metFORMIN, predniSONE, Spiriva Respimat, torsemide, polyethylene glycol, losartan, ALPRAZolam, mometasone, montelukast, Dulera, albuterol, spironolactone, roflumilast, nicotine, and empagliflozin.  Meds ordered this encounter  Medications  . nicotine (NICODERM CQ - DOSED IN MG/24 HOURS) 21 mg/24hr patch    Sig: Place 1 patch (21 mg total) onto the skin daily.    Dispense:  28 patch    Refill:  0  . nicotine (NICODERM CQ) 14 mg/24hr patch    Sig: Place 1 patch (14 mg total) onto the skin daily.    Dispense:  28 patch    Refill:  0  . atorvastatin (LIPITOR) 10  MG tablet    Sig: Take 1 tablet (10 mg total) by mouth daily.    Dispense:  90 tablet    Refill:  1  . empagliflozin (JARDIANCE) 10 MG TABS tablet    Sig: Take 1 tablet (10 mg total) by mouth daily before breakfast.    Dispense:  90 tablet    Refill:  1   I spent 45 minutes in preparing to see the patient by review of recent labs, imaging and procedures, obtaining and reviewing separately obtained history, communicating with the patient , ordering medications and labs and documenting clinical information in the EHR including the differential Dx, treatment, and any further evaluation and other management of 1. Type II diabetes mellitus with manifestations ([place]) 2. Hyperlipidemia LDL goal <70 3. Hypertriglyceridemia 4. Elevated LFTs 5. Primary hypertension 6. Thrombocytopenia ([place]) 7. Cigarette smoker 8. Panlobular emphysema ([place]) 9. Atherosclerosis of aorta ([place]) 10. Chronic combined systolic and diastolic CHF (congestive heart failure) ([place])     Follow-up: Return in about 3 months (around 06/24/2021).  Scarlette Calico, MD

## 2021-03-25 NOTE — Patient Instructions (Signed)
Type 2 Diabetes Mellitus, Diagnosis, Adult Type 2 diabetes (type 2 diabetes mellitus) is a long-term, or chronic, disease. In type 2 diabetes, one or both of these problems may be present:  The pancreas does not make enough of a hormone called insulin.  Cells in the body do not respond properly to insulin that the body makes (insulin resistance). Normally, insulin allows blood sugar (glucose) to enter cells in the body. The cells use glucose for energy. Insulin resistance or lack of insulin causes excess glucose to build up in the blood instead of going into cells. This causes high blood glucose (hyperglycemia).  What are the causes? The exact cause of type 2 diabetes is not known. What increases the risk? The following factors may make you more likely to develop this condition:  Having a family member with type 2 diabetes.  Being overweight or obese.  Being inactive (sedentary).  Having been diagnosed with insulin resistance.  Having a history of prediabetes, diabetes when you were pregnant (gestational diabetes), or polycystic ovary syndrome (PCOS). What are the signs or symptoms? In the early stage of this condition, you may not have symptoms. Symptoms develop slowly and may include:  Increased thirst or hunger.  Increased urination.  Unexplained weight loss.  Tiredness (fatigue) or weakness.  Vision changes, such as blurry vision.  Dark patches on the skin. How is this diagnosed? This condition is diagnosed based on your symptoms, your medical history, a physical exam, and your blood glucose level. Your blood glucose may be checked with one or more of the following blood tests:  A fasting blood glucose (FBG) test. You will not be allowed to eat (you will fast) for 8 hours or longer before a blood sample is taken.  A random blood glucose test. This test checks blood glucose at any time of day regardless of when you ate.  An A1C (hemoglobin A1C) blood test. This test  provides information about blood glucose levels over the previous 2-3 months.  An oral glucose tolerance test (OGTT). This test measures your blood glucose at two times: ? After fasting. This is your baseline blood glucose level. ? Two hours after drinking a beverage that contains glucose. You may be diagnosed with type 2 diabetes if:  Your fasting blood glucose level is 126 mg/dL (7.0 mmol/L) or higher.  Your random blood glucose level is 200 mg/dL (11.1 mmol/L) or higher.  Your A1C level is 6.5% or higher.  Your oral glucose tolerance test result is higher than 200 mg/dL (11.1 mmol/L). These blood tests may be repeated to confirm your diagnosis.   How is this treated? Your treatment may be managed by a specialist called an endocrinologist. Type 2 diabetes may be treated by following instructions from your health care provider about:  Making dietary and lifestyle changes. These may include: ? Following a personalized nutrition plan that is developed by a registered dietitian. ? Exercising regularly. ? Finding ways to manage stress.  Checking your blood glucose level as often as told.  Taking diabetes medicines or insulin daily. This helps to keep your blood glucose levels in the healthy range.  Taking medicines to help prevent complications from diabetes. Medicines may include: ? Aspirin. ? Medicine to lower cholesterol. ? Medicine to control blood pressure. Your health care provider will set treatment goals for you. Your goals will be based on your age, other medical conditions you have, and how you respond to diabetes treatment. Generally, the goal of treatment is to maintain the   following blood glucose levels:  Before meals: 80-130 mg/dL (4.4-7.2 mmol/L).  After meals: below 180 mg/dL (10 mmol/L).  A1C level: less than 7%. Follow these instructions at home: Questions to ask your health care provider Consider asking the following questions:  Should I meet with a certified  diabetes care and education specialist?  What diabetes medicines do I need, and when should I take them?  What equipment will I need to manage my diabetes at home?  How often do I need to check my blood glucose?  Where can I find a support group for people with diabetes?  What number can I call if I have questions?  When is my next appointment? General instructions  Take over-the-counter and prescription medicines only as told by your health care provider.  Keep all follow-up visits as told by your health care provider. This is important. Where to find more information  American Diabetes Association (ADA): www.diabetes.org  American Association of Diabetes Care and Education Specialists (ADCES): www.diabeteseducator.org  International Diabetes Federation (IDF): www.idf.org Contact a health care provider if:  Your blood glucose is at or above 240 mg/dL (13.3 mmol/L) for 2 days in a row.  You have been sick or have had a fever for 2 days or longer, and you are not getting better.  You have any of the following problems for more than 6 hours: ? You cannot eat or drink. ? You have nausea and vomiting. ? You have diarrhea. Get help right away if:  You have severe hypoglycemia. This means your blood glucose is lower than 54 mg/dL (3.0 mmol/L).  You become confused or you have trouble thinking clearly.  You have difficulty breathing.  You have moderate or large ketone levels in your urine. These symptoms may represent a serious problem that is an emergency. Do not wait to see if the symptoms will go away. Get medical help right away. Call your local emergency services (911 in the [place] Do not drive yourself to the hospital. Summary  Type 2 diabetes (type 2 diabetes mellitus) is a long-term, or chronic, disease. In type 2 diabetes, the pancreas does not make enough of a hormone called insulin, or cells in the body do not respond properly to insulin that the body makes (insulin  resistance).  This condition is treated by making dietary and lifestyle changes and taking diabetes medicines or insulin.  Your health care provider will set treatment goals for you. Your goals will be based on your age, other medical conditions you have, and how you respond to diabetes treatment.  Keep all follow-up visits as told by your health care provider. This is important. This information is not intended to replace advice given to you by your health care provider. Make sure you discuss any questions you have with your health care provider. Document Revised: 07/03/2020 Document Reviewed: 07/03/2020 Elsevier Patient Education  2021 [employer]  

## 2021-03-26 ENCOUNTER — Encounter: Payer: Self-pay | Admitting: Internal Medicine

## 2021-03-26 ENCOUNTER — Ambulatory Visit (INDEPENDENT_AMBULATORY_CARE_PROVIDER_SITE_OTHER): Payer: 59 | Admitting: Internal Medicine

## 2021-03-26 VITALS — BP 132/72 | HR 74 | Ht 70.0 in | Wt 223.4 lb

## 2021-03-26 DIAGNOSIS — R1310 Dysphagia, unspecified: Secondary | ICD-10-CM

## 2021-03-26 DIAGNOSIS — K6289 Other specified diseases of anus and rectum: Secondary | ICD-10-CM | POA: Diagnosis not present

## 2021-03-26 DIAGNOSIS — K5900 Constipation, unspecified: Secondary | ICD-10-CM

## 2021-03-26 DIAGNOSIS — K219 Gastro-esophageal reflux disease without esophagitis: Secondary | ICD-10-CM

## 2021-03-26 DIAGNOSIS — K602 Anal fissure, unspecified: Secondary | ICD-10-CM

## 2021-03-26 DIAGNOSIS — K649 Unspecified hemorrhoids: Secondary | ICD-10-CM

## 2021-03-26 DIAGNOSIS — Z86010 Personal history of colonic polyps: Secondary | ICD-10-CM

## 2021-03-26 LAB — URINALYSIS, ROUTINE W REFLEX MICROSCOPIC
Bilirubin Urine: NEGATIVE
Hgb urine dipstick: NEGATIVE
Ketones, ur: NEGATIVE
Leukocytes,Ua: NEGATIVE
Nitrite: NEGATIVE
RBC / HPF: NONE SEEN (ref 0–?)
Specific Gravity, Urine: 1.01 (ref 1.000–1.030)
Total Protein, Urine: NEGATIVE
Urine Glucose: NEGATIVE
Urobilinogen, UA: 0.2 (ref 0.0–1.0)
WBC, UA: NONE SEEN (ref 0–?)
pH: 5 (ref 5.0–8.0)

## 2021-03-26 LAB — HEPATITIS A ANTIBODY, TOTAL: Hepatitis A AB,Total: NONREACTIVE

## 2021-03-26 LAB — HEPATITIS B SURFACE ANTIGEN: Hepatitis B Surface Ag: NONREACTIVE

## 2021-03-26 LAB — HEPATITIS B CORE ANTIBODY, TOTAL: Hep B Core Total Ab: NONREACTIVE

## 2021-03-26 LAB — HEPATITIS B SURFACE ANTIBODY, QUANTITATIVE: Hep B S AB Quant (Post): <5 m[IU]/mL — ABNORMAL LOW (ref >=10)

## 2021-03-26 MED ORDER — EMPAGLIFLOZIN 10 MG PO TABS: 10.0000 mg | ORAL_TABLET | Freq: Every day | ORAL | 1 refills | Status: DC

## 2021-03-26 MED ORDER — ALPRAZOLAM 1 MG PO TABS: 1.0000 mg | ORAL_TABLET | ORAL | 0 refills | Status: DC

## 2021-03-26 MED ORDER — FLUCONAZOLE 100 MG PO TABS: ORAL_TABLET | ORAL | 0 refills | Status: DC

## 2021-03-26 NOTE — Patient Instructions (Signed)
Continue omeprazole.  We have sent the following medications to your pharmacy for you to pick up at your convenience: Fluconazole 200 mg on day 1, then take 100 mg daily x 13 days. Xanax 1 mg-Take 1 tablet 1 hour prior to your MRI  Continue lidocaine and nitroglycerin for now.  Continue Miralax.  You have been scheduled for an MRI at [facility] on 04/19/21. Your appointment time is 1:50 pm. Please arrive 30 minutes prior to your appointment time for registration purposes. Please make certain not to have anything to eat or drink 6 hours prior to your test. In addition, if you have any metal in your body, have a pacemaker or defibrillator, please be sure to let your ordering physician know. This test typically takes 45 minutes to 1 hour to complete. Should you need to reschedule, please call [phone] to do so.  We will refer you over to [facility] to see either Dr Dema Severin or Dr Marcello Moores for persistent anal fissure following your MRI. You should get a call with an appointment within the next 1-2 weeks. If you do not, please call our office at [phone].  We will request records from [facility] office.  If you are age 63 or younger, your body mass index should be between 19-25. Your Body mass index is 32.05 kg/m. If this is out of the aformentioned range listed, please consider follow up with your Primary Care Provider.   Due to recent changes in healthcare laws, you may see the results of your imaging and laboratory studies on MyChart before your provider has had a chance to review them.  We understand that in some cases there may be results that are confusing or concerning to you. Not all laboratory results come back in the same time frame and the provider may be waiting for multiple results in order to interpret others.  Please give [place] 48 hours in order for your provider to thoroughly review all the results before contacting the office for clarification of your results.

## 2021-03-26 NOTE — Progress Notes (Signed)
Patient ID: Robert Church, male   DOB: 1956-08-22, 65 y.o.   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 Ronnald Ramp to evaluate perianal pain, constipation, history of polyps.  He is here alone today.  He has a GI history with Dr. Earlean Shawl and was last seen by Dr. Earlean Shawl in November 2021.  He reports that he continues to have issues with perianal pain with passing stool which he attributes to fissure.  He has had chronic fissures though several months ago he felt like they had mostly healed but they returned in the last several weeks.  He reports he is using MiraLAX daily and having 1 or 2 bowel movements per day.  He does feel that his bowel movements are complete.  At times stool can be slightly hard at the beginning but then it becomes more soft and easier to pass.  He does report increased intestinal gas and flatulence.  His perianal pain with passing stool can be quite intense and it is worse if he is active.  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  He has been using a combination of nitroglycerin gel, topical lidocaine and an organic product containing cottonseed and soybean oil called forces of nature.  He also reports that he feels a "knot" on the right side of his perianal skin which will intermittently drain and bleed.  Separate from his colonic and perianal issues he reports that he has history of GERD for which he takes omeprazole 20 mg twice daily.  He reports a burning discomfort with swallowing.  There is no dysphagia.  No nausea or vomiting.  No early satiety or weight loss.  He has never had an upper endoscopy.  There is report of previous colonoscopy in 2018 with removal of adenomatous polyps.  Dr. Liliane Channel last note states that he is  "long overdue" for surveillance colonoscopy.  Past Medical History:  Diagnosis Date  . Adenomatous colon polyp   . Allergy   . Anal fissure   . Asthma   . Bronchitis   . CHF (congestive heart failure) ([place])   . COPD (chronic obstructive pulmonary disease) ([place])   . Emphysema lung ([place])   . GERD (gastroesophageal reflux disease)   . Hyperlipidemia   . Hypertension   . OSA (obstructive sleep apnea)   . Pneumonia   . Sinusitis   . SOB (shortness of breath)     Past Surgical History:  Procedure Laterality Date  . LEFT HEART CATH AND CORONARY ANGIOGRAPHY N/A 04/19/2017   Procedure: Left Heart Cath and Coronary Angiography;  Surgeon: Belva Crome, MD;  Location: [facility];  Service: Cardiovascular;  Laterality: N/A;  . TONSILLECTOMY      Outpatient Medications Prior to Visit  Medication Sig Dispense Refill  . albuterol (PROVENTIL) (2.5 MG/3ML) 0.083% nebulizer solution Take 3 mLs (2.5 mg total) by nebulization every 6 (six) hours as needed for wheezing or shortness of breath. 75 mL 12  . albuterol (VENTOLIN HFA) 108 (90 Base) MCG/ACT inhaler INHALE 2 PUFFS INTO THE LUNGS EVERY 6 HOURS AS NEEDED FOR WHEEZING 18 g 2  . allopurinol (ZYLOPRIM) 300 MG tablet Take 1 tablet (300 mg total) by mouth daily. 90 tablet 1  . ALPRAZolam (XANAX) 0.25 MG tablet Take  1 tablet (0.25 mg total) by mouth 2 (two) times daily as needed for anxiety. 60 tablet 1  . atorvastatin (LIPITOR) 10 MG tablet Take 1 tablet (10 mg total) by mouth daily. 90 tablet 1  . cholecalciferol (VITAMIN D) 1000 units tablet Take 1,000 Units by mouth daily.    Marland Kitchen losartan (COZAAR) 25 MG tablet Take 0.5 tablets (12.5 mg total) by mouth daily. 45 tablet 1  . metFORMIN (GLUCOPHAGE XR) 750 MG 24 hr tablet Take 1 tablet (750 mg total) by mouth daily with breakfast. 90 tablet 1  . mometasone (NASONEX) 50 MCG/ACT nasal spray Place 2 sprays into the nose daily. 17 g 5  . mometasone-formoterol (DULERA) 200-5 MCG/ACT AERO Dulera  200 mcg-5 mcg/actuation HFA aerosol inhaler  INHALE 2 PUFFS BY MOUTH FIRST THING IN THE MORNING AND THEN ANOTHER 2 PUFFS ABOUT 12 HOURS LATER 1 each 2  . montelukast (SINGULAIR) 10 MG tablet Take 1 tablet (10 mg total) by mouth at bedtime. 30 tablet 5  . nicotine (NICODERM CQ - DOSED IN MG/24 HOURS) 21 mg/24hr patch Place 1 patch (21 mg total) onto the skin daily. 28 patch 0  . nicotine (NICODERM CQ) 14 mg/24hr patch Place 1 patch (14 mg total) onto the skin daily. 28 patch 0  . omeprazole (PRILOSEC) 20 MG capsule Take 1 capsule (20 mg total) by mouth 2 (two) times daily before a meal. 180 capsule 1  . polyethylene glycol (MIRALAX / GLYCOLAX) 17 g packet Take 17 g by mouth daily as needed. 14 each 0  . predniSONE (DELTASONE) 10 MG tablet Take 1 tablet (10 mg total) by mouth daily with breakfast. 30 tablet 2  . roflumilast (DALIRESP) 500 MCG TABS tablet Take 1 tablet (500 mcg total) by mouth daily. 30 tablet 3  . spironolactone (ALDACTONE) 25 MG tablet Take 1 tablet (25 mg total) by mouth daily. 90 tablet 3  . Tiotropium Bromide Monohydrate (SPIRIVA RESPIMAT) 2.5 MCG/ACT AERS Inhale 2.5 mcg into the lungs 2 (two) times daily. 4 g 11  . torsemide (DEMADEX) 10 MG tablet Take 1 tablet (10 mg total) by mouth daily. 45 tablet 1  . empagliflozin (JARDIANCE) 10 MG TABS tablet Take 1 tablet (10 mg total) by mouth daily before breakfast. (Patient not taking: Reported on 03/26/2021) 90 tablet 1   No facility-administered medications prior to visit.    Allergies  Allergen Reactions  . Clindamycin Other (See Comments)    Tongue swelling  . Doxycycline     Severe stomach upset per patient  . E-Mycin [Erythromycin Base] Swelling  . Penicillins Swelling and Other (See Comments)    Childhood rxn--MD stated he "almost died" Has patient had a PCN reaction causing immediate rash, facial/tongue/throat swelling, SOB or lightheadedness with hypotension:Yes Has patient had a PCN reaction causing severe rash  involving mucus membranes or skin necrosis:unsure Has patient had a PCN reaction that required hospitalization:unsure Has patient had a PCN reaction occurring within the last 10 years:No If all of the above answers are "NO", then may proceed with Cephalosporin use.    . Rosuvastatin     Other reaction(s): Myalgias (intolerance)    Family History  Problem Relation Age of Onset  . Lung cancer Father   . Brain cancer Father   . Diabetes Maternal Grandmother   . Colon polyps Neg Hx   . Esophageal cancer Neg Hx   . Pancreatic cancer Neg Hx   . Stomach cancer Neg Hx     Social History  Tobacco Use  . Smoking status: Current Some Day Smoker    Packs/day: 0.25    Years: 46.00    Pack years: 11.50    Types: Cigarettes  . Smokeless tobacco: Never Used  Vaping Use  . Vaping Use: Never used  Substance Use Topics  . Alcohol use: Yes    Alcohol/week: 3.0 standard drinks    Types: 3 Cans of beer per week    Comment: 3 beers per day  . Drug use: No    ROS: As per history of present illness, otherwise negative  BP 132/72 (BP Location: Left Arm, Patient Position: Sitting, Cuff Size: Normal)   Pulse 74   Ht 5\' 10"  (1.778 m)   Wt 223 lb 6 oz (101.3 kg)   BMI 32.05 kg/m  Gen: awake, alert, NAD HEENT: anicteric CV: RRR, no mrg Pulm: CTA b/l Abd: soft, NT/ND, +BS throughout Rectal: External exam only with some small perianal skin tags, a left posterior lateral fissure which is tender, and a small nodule query fistula without fluctuance on the right of the anus Ext: no c/c/e Neuro: nonfocal   RELEVANT LABS AND IMAGING: CBC    Component Value Date/Time   WBC 8.1 03/25/2021 1519   RBC 4.74 03/25/2021 1519   HGB 15.9 03/25/2021 1519   HGB 18.2 (H) 04/15/2017 1140   HCT 46.0 03/25/2021 1519   HCT 51.7 (H) 04/15/2017 1140   PLT 190.0 03/25/2021 1519   PLT 218 04/15/2017 1140   MCV 96.9 03/25/2021 1519   MCV 99 (H) 04/15/2017 1140   MCH 32.6 12/28/2020 1417   MCHC 34.6  03/25/2021 1519   RDW 14.6 03/25/2021 1519   RDW 12.4 04/15/2017 1140   LYMPHSABS 0.6 (L) 03/25/2021 1519   LYMPHSABS 1.3 04/15/2017 1140   MONOABS 0.4 03/25/2021 1519   EOSABS 0.0 03/25/2021 1519   EOSABS 0.0 04/15/2017 1140   BASOSABS 0.0 03/25/2021 1519   BASOSABS 0.0 04/15/2017 1140    CMP     Component Value Date/Time   NA 140 03/25/2021 1519   NA 138 10/09/2019 0000   K 4.8 03/25/2021 1519   CL 103 03/25/2021 1519   CO2 28 03/25/2021 1519   GLUCOSE 171 (H) 03/25/2021 1519   BUN 25 (H) 03/25/2021 1519   BUN 18 10/09/2019 0000   CREATININE 1.18 03/25/2021 1519   CREATININE 1.04 07/10/2020 1035   CALCIUM 9.4 03/25/2021 1519   PROT 6.3 03/25/2021 1519   ALBUMIN 4.2 03/25/2021 1519   AST 19 03/25/2021 1519   ALT 17 03/25/2021 1519   ALKPHOS 52 03/25/2021 1519   BILITOT 0.4 03/25/2021 1519   GFRNONAA >60 03/11/2021 1155   GFRAA >60 02/14/2020 1454    ASSESSMENT/PLAN: 65 year old male with a past medical history of adenomatous colon polyps, 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 Ronnald Ramp to evaluate perianal pain, constipation, history of polyps.    1.  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  Dr. Earlean Shawl had referred him or at least recommended surgical evaluation but I do not think this ever occurred.  It is possible that he would benefit from surgical management including possible Botox or even fissure repair.  Given the possibility of fistula I would recommend that we image the pelvis first.  Also colonoscopy remains recommended but the patient does  not feel that he can prep with the amount of perianal pain that he is currently experiencing.  I recommended the following: --MRI pelvis with an open MRI; 1 mg Xanax 1 hour before the test patient  has claustrophobia --Anticipate colorectal surgery referral after reviewing MRI pelvis --Continue nitroglycerin ointment 0.2% 2-3 times daily to the anal canal, lidocaine 5% 3-4 times daily as needed pain  2.  History of adenomatous colon polyps --per notes colonoscopy is overdue.  I have requested the formal report from Dr. Earlean Shawl for review.  Made the patient aware that I recommend colonoscopy but he would like to defer this until such time his fissures have healed.  3.  Chronic constipation --continue MiraLAX 17 g daily  4.  Odynophagia --patient does take steroid inhalers for lung disease.  I am suspicious for Candida esophagitis.  Going to empirically treat --Fluconazole 200 mg x 1 day, 100 mg x 13 days  5.  GERD --given longstanding reflux but also odynophagia upper endoscopy is recommended.  We will plan to perform upper endoscopy on the same day as colonoscopy as above.    MW:NUUVO, Arvid Right, [place],  [place] [address]

## 2021-03-27 ENCOUNTER — Encounter: Payer: Self-pay | Admitting: Internal Medicine

## 2021-03-27 NOTE — Telephone Encounter (Signed)
Pt called back because he is still having headaches and dizziness after med changes. (please see pervious note)  Routed to [employer] for advice

## 2021-03-31 ENCOUNTER — Ambulatory Visit (HOSPITAL_COMMUNITY)
Admission: RE | Admit: 2021-03-31 | Discharge: 2021-03-31 | Disposition: A | Discharge status: Home / Self Care | Payer: 59 | Source: Ambulatory Visit | Attending: Cardiology | Admitting: Cardiology

## 2021-03-31 ENCOUNTER — Encounter (HOSPITAL_COMMUNITY): Payer: Self-pay

## 2021-03-31 ENCOUNTER — Ambulatory Visit: Payer: 59

## 2021-03-31 ENCOUNTER — Other Ambulatory Visit: Payer: Self-pay

## 2021-03-31 VITALS — BP 140/84 | HR 83 | Wt 222.2 lb

## 2021-03-31 DIAGNOSIS — Z713 Dietary counseling and surveillance: Secondary | ICD-10-CM | POA: Diagnosis not present

## 2021-03-31 DIAGNOSIS — I110 Hypertensive heart disease with heart failure: Secondary | ICD-10-CM | POA: Diagnosis not present

## 2021-03-31 DIAGNOSIS — I428 Other cardiomyopathies: Secondary | ICD-10-CM | POA: Insufficient documentation

## 2021-03-31 DIAGNOSIS — Z7952 Long term (current) use of systemic steroids: Secondary | ICD-10-CM | POA: Insufficient documentation

## 2021-03-31 DIAGNOSIS — L905 Scar conditions and fibrosis of skin: Secondary | ICD-10-CM | POA: Insufficient documentation

## 2021-03-31 DIAGNOSIS — I5023 Acute on chronic systolic (congestive) heart failure: Secondary | ICD-10-CM | POA: Diagnosis present

## 2021-03-31 DIAGNOSIS — Z6831 Body mass index (BMI) 31.0-31.9, adult: Secondary | ICD-10-CM | POA: Diagnosis not present

## 2021-03-31 DIAGNOSIS — J449 Chronic obstructive pulmonary disease, unspecified: Secondary | ICD-10-CM | POA: Insufficient documentation

## 2021-03-31 DIAGNOSIS — G4733 Obstructive sleep apnea (adult) (pediatric): Secondary | ICD-10-CM | POA: Diagnosis not present

## 2021-03-31 DIAGNOSIS — I5022 Chronic systolic (congestive) heart failure: Secondary | ICD-10-CM | POA: Diagnosis not present

## 2021-03-31 DIAGNOSIS — Z7901 Long term (current) use of anticoagulants: Secondary | ICD-10-CM | POA: Insufficient documentation

## 2021-03-31 DIAGNOSIS — Z87891 Personal history of nicotine dependence: Secondary | ICD-10-CM | POA: Insufficient documentation

## 2021-03-31 DIAGNOSIS — Z7984 Long term (current) use of oral hypoglycemic drugs: Secondary | ICD-10-CM | POA: Diagnosis not present

## 2021-03-31 DIAGNOSIS — Z7951 Long term (current) use of inhaled steroids: Secondary | ICD-10-CM | POA: Insufficient documentation

## 2021-03-31 DIAGNOSIS — Z79899 Other long term (current) drug therapy: Secondary | ICD-10-CM | POA: Insufficient documentation

## 2021-03-31 NOTE — Progress Notes (Signed)
ReDS Vest / Clip - 03/31/21 1200      ReDS Vest / Clip   Station Marker D    Ruler Value 32    ReDS Value Range Moderate volume overload    ReDS Actual Value 39    Anatomical Comments sitting

## 2021-03-31 NOTE — Patient Instructions (Addendum)
No Labs done today.   No medication changes were made. Please continue all current medications as prescribed.  Your physician recommends that you schedule a follow-up appointment in: 3 months with Dr. Haroldine Laws   If you have any questions or concerns before your next appointment please send [place] a message through [facility] or call our office at [phone].    TO LEAVE A MESSAGE FOR THE NURSE SELECT OPTION 2, PLEASE LEAVE A MESSAGE INCLUDING: . YOUR NAME . DATE OF BIRTH . CALL BACK NUMBER . REASON FOR CALL**this is important as we prioritize the call backs  YOU WILL RECEIVE A CALL BACK THE SAME DAY AS LONG AS YOU CALL BEFORE 4:00 PM   Do the following things EVERYDAY: 1) Weigh yourself in the morning before breakfast. Write it down and keep it in a log. 2) Take your medicines as prescribed 3) Eat low salt foods--Limit salt (sodium) to 2000 mg per day.  4) Stay as active as you can everyday 5) Limit all fluids for the day to less than 2 liters   At the [facility], you and your health needs are our priority. As part of our continuing mission to provide you with exceptional heart care, we have created designated Provider Care Teams. These Care Teams include your primary Cardiologist (physician) and Advanced Practice Providers (APPs- Physician Assistants and Nurse Practitioners) who all work together to provide you with the care you need, when you need it.   You may see any of the following providers on your designated Care Team at your next follow up: Marland Kitchen Dr Glori Bickers . Dr Loralie Champagne . Darrick Grinder, NP . Lyda Jester, PA . Audry Riles, PharmD   Please be sure to bring in all your medications bottles to every appointment.

## 2021-03-31 NOTE — Progress Notes (Signed)
ADVANCED HF CLINIC NOTE  MD: Dr. Haroldine Laws  Reason for Visit: [facility] F/u for Systolic Heart Failure  HPI:  Robert Church is a 65 y/o male with h/o obesity, HTN, COPD with ongoing tobacco use and chronic systolic HF referred by Dr. Gwenlyn Found for further management of his HF.   He underwent cath in 4/18 due to CP. Cath showed normal coronaries with EF 40% and global HK. LVEDP 19. He was referred for a sleep study.  He saw Dr. Gwenlyn Found on 07/01/2018 because of recurrent chest pain.  Myoview stress test performed 07/21/2018 showed EF 38% inferior scar with septal ischemia.  A 2D echo performed 07/07/2018 revealed an EF of 30 to 35%.  He was referred for cardiac CT.   Cardiac CT on 08/26/18.  Calcium score 148 (74th percentile)  LAD 25% otherwise normal cors. Dilated pulmonary arteries suggestive of PH.   Works for [employer]. Follows with [name] in Pulmonary Clinic. Was started on bisoprolol 2.5 but couldn't tolerate due to fatigue. Was falling asleep at work. Stopped bisoprolol and felt better.   Echo 12/20 EF improved to 45%. Mild RV dysfunction   Recently admitted 1/22 for acute on chronic hypoxic respiratory failure secondary to acute COPDE and a/c CHF w/ volume overload. He had massive diuresis w/ IV Lasix, diuresed down from admit wt of 265 lb to 210 lb. Echo was repeated 12/21, EF 40-45% (unchanged). RV mildly reduced, right ventricular systolic pressure was 67.3 mmHg. I evaluated him for post hospital f/u and his volume status was stable. ReDs clip was 33%. BP was soft but stable. No med changes were made.   Seen back in clinic on 03/04/21 and Reported recent 7 lb wt gain and increased DOE. Also bendopnea. ReDs Clip elevated at 46%. BP 130/70. I increased spiro to 25 and added Jardiance 10 mg daily (Hgb A1c was 6.5).   He returns back to clinic today for f/u. Wt down 3 lb. ReDs Clip lower but still mildly elevated at 39%. Breathing however has improved. He is compliant w/ Jardiance  w/o GU side effects but reports he self reduced spiro back to 12.5 mg once daily. Did not tolerate 25 mg dose due to dizziness. Had similar issues w/ higher dose losartan. He refuses to re-try 25 mg dose of spiro. He has lots of med intolerances. He had f/u w/ his PCP last week who checked labs. Both renal function and K were stable w/ jardiance.    Past Medical History:  Diagnosis Date  . Adenomatous colon polyp   . Allergy   . Anal fissure   . Asthma   . Bronchitis   . CHF (congestive heart failure) ([place])   . COPD (chronic obstructive pulmonary disease) ([place])   . Emphysema lung ([place])   . GERD (gastroesophageal reflux disease)   . Hyperlipidemia   . Hypertension   . OSA (obstructive sleep apnea)   . Pneumonia   . Sinusitis   . SOB (shortness of breath)     Current Outpatient Medications  Medication Sig Dispense Refill  . albuterol (PROVENTIL) (2.5 MG/3ML) 0.083% nebulizer solution Take 3 mLs (2.5 mg total) by nebulization every 6 (six) hours as needed for wheezing or shortness of breath. 75 mL 12  . albuterol (VENTOLIN HFA) 108 (90 Base) MCG/ACT inhaler INHALE 2 PUFFS INTO THE LUNGS EVERY 6 HOURS AS NEEDED FOR WHEEZING 18 g 2  . allopurinol (ZYLOPRIM) 300 MG tablet Take 1 tablet (300 mg total) by mouth  daily. 90 tablet 1  . AMBULATORY NON FORMULARY MEDICATION Medication Name: NTG ointment as needed.    . cholecalciferol (VITAMIN D) 1000 units tablet Take 1,000 Units by mouth daily.    . fluconazole (DIFLUCAN) 100 MG tablet Take 200 mg (2 tablets) by mouth on day 1, then take 100 mg (1 tablet) by mouth on day 2-14. 15 tablet 0  . JARDIANCE 10 MG TABS tablet Take 10 mg by mouth every morning.    . Lidocaine, Anorectal, 5 % CREA Apply topically as needed.    Marland Kitchen losartan (COZAAR) 25 MG tablet Take 0.5 tablets (12.5 mg total) by mouth daily. 45 tablet 1  . metFORMIN (GLUCOPHAGE XR) 750 MG 24 hr tablet Take 1 tablet (750 mg total) by mouth daily with breakfast. 90 tablet 1  . mometasone  (NASONEX) 50 MCG/ACT nasal spray Place 2 sprays into the nose daily. 17 g 5  . mometasone-formoterol (DULERA) 200-5 MCG/ACT AERO Dulera 200 mcg-5 mcg/actuation HFA aerosol inhaler  INHALE 2 PUFFS BY MOUTH FIRST THING IN THE MORNING AND THEN ANOTHER 2 PUFFS ABOUT 12 HOURS LATER 1 each 2  . montelukast (SINGULAIR) 10 MG tablet Take 1 tablet (10 mg total) by mouth at bedtime. 30 tablet 5  . nicotine (NICODERM CQ - DOSED IN MG/24 HOURS) 21 mg/24hr patch Place 1 patch (21 mg total) onto the skin daily. 28 patch 0  . nicotine (NICODERM CQ) 14 mg/24hr patch Place 1 patch (14 mg total) onto the skin daily. 28 patch 0  . omeprazole (PRILOSEC) 20 MG capsule Take 1 capsule (20 mg total) by mouth 2 (two) times daily before a meal. 180 capsule 1  . polyethylene glycol (MIRALAX / GLYCOLAX) 17 g packet Take 17 g by mouth daily as needed. 14 each 0  . predniSONE (DELTASONE) 10 MG tablet Take 1 tablet (10 mg total) by mouth daily with breakfast. 30 tablet 2  . roflumilast (DALIRESP) 500 MCG TABS tablet Take 1 tablet (500 mcg total) by mouth daily. 30 tablet 3  . spironolactone (ALDACTONE) 25 MG tablet Take 12.5 mg by mouth daily.    . Tiotropium Bromide Monohydrate (SPIRIVA RESPIMAT) 2.5 MCG/ACT AERS Inhale 2.5 mcg into the lungs 2 (two) times daily. 4 g 11  . torsemide (DEMADEX) 10 MG tablet Take 1 tablet (10 mg total) by mouth daily. 45 tablet 1   No current facility-administered medications for this encounter.    Allergies  Allergen Reactions  . Clindamycin Other (See Comments)    Tongue swelling  . Doxycycline     Severe stomach upset per patient  . E-Mycin [Erythromycin Base] Swelling  . Penicillins Swelling and Other (See Comments)    Childhood rxn--MD stated he "almost died" Has patient had a PCN reaction causing immediate rash, facial/tongue/throat swelling, SOB or lightheadedness with hypotension:Yes Has patient had a PCN reaction causing severe rash involving mucus membranes or skin  necrosis:unsure Has patient had a PCN reaction that required hospitalization:unsure Has patient had a PCN reaction occurring within the last 10 years:No If all of the above answers are "NO", then may proceed with Cephalosporin use.    . Rosuvastatin     Other reaction(s): Myalgias (intolerance)      Social History   Socioeconomic History  . Marital status: Single    Spouse name: Not on file  . Number of children: Not on file  . Years of education: Not on file  . Highest education level: Not on file  Occupational History  . Occupation:  mechanic  Tobacco Use  . Smoking status: Former Smoker    Packs/day: 0.25    Years: 46.00    Pack years: 11.50    Types: Cigarettes  . Smokeless tobacco: Never Used  Vaping Use  . Vaping Use: Never used  Substance and Sexual Activity  . Alcohol use: Yes    Alcohol/week: 3.0 standard drinks    Types: 3 Cans of beer per week    Comment: 3 beers per day  . Drug use: No  . Sexual activity: Yes    Birth control/protection: None  Other Topics Concern  . Not on file  Social History Narrative  . Not on file   Social Determinants of Health   Financial Resource Strain: Not on file  Food Insecurity: Not on file  Transportation Needs: Not on file  Physical Activity: Not on file  Stress: Not on file  Social Connections: Not on file  Intimate Partner Violence: Not on file      Family History  Problem Relation Age of Onset  . Lung cancer Father   . Brain cancer Father   . Diabetes Maternal Grandmother   . Colon polyps Neg Hx   . Esophageal cancer Neg Hx   . Pancreatic cancer Neg Hx   . Stomach cancer Neg Hx     Vitals:   03/31/21 1207  BP: 140/84  Pulse: 83  SpO2: 94%  Weight: 100.8 kg (222 lb 3.2 oz)    PHYSICAL EXAM: ReDs Clip 39% General:  Well appearing. No respiratory difficulty HEENT: normal Neck: supple. no JVD. Carotids 2+ bilat; no bruits. No lymphadenopathy or thyromegaly appreciated. Cor: PMI nondisplaced.  Regular rate & rhythm. No rubs, gallops or murmurs. Lungs: clear Abdomen: moderately obese, soft, nontender, nondistended. No hepatosplenomegaly. No bruits or masses. Good bowel sounds. Extremities: no cyanosis, clubbing, rash, trace edema Neuro: alert & oriented x 3, cranial nerves grossly intact. moves all 4 extremities w/o difficulty. Affect pleasant.    ASSESSMENT & PLAN:  1. Acute on Chronic systolic HF due to NICM  - cath 4/18 normal cors. EF 48%  - Myoview 07/21/2018 showed EF 38% inferior scar with septal ischemia.   - Echo 07/07/2018 revealed EF 30-35%. RV normal.  - Cardiac CT 08/26/18.  Calcium score 148 (74th percentile)  LAD 25% otherwise normal cors. Dilated pulmonary arteries suggestive of PH.  - Suspect NICM due to HTN and inadequately treated OSA (He now reports improved nightly compliance w/ CPAP). - Echo 12/20 EF 45% mild RV dysfunction - Recent admit for a/c CHF 1/21, EF unchanged 40-45%. RV mildly reduced. RVSP 49 - Chronically NYHA Class II-III, confounded by COPD and obesity/ deconditioningn - Volume status improving w/ addition of Jardiance, ReDs clip 46>>39%.  - Continue Jardiance 10 mg daily  - Continue torsemide 10 mg daily  - Continue Losartan 12.5 mg daily (failed Entresto due to dizziness/low BP)  - Continue Spiro 12.5 mg bid (did not tolerate 25 mg dose). - Could not tolerate bisoprolol due to fatigue.  - BMP done at PCP office reviewed. SCr/K stable - Instructed to weigh daily. Can take extra torsemide if > 3 lb wt gain in 24 hr or > 5 lb in 1 week  - Advised fluid restriction < 2L/day + low sodium diet   2. HTN - well controlled on current regimen - he has only been able to tolerate low doses of losartan and spiro   3. COPD  -  has quit smoking, congratulated on efforts  -  Follows with Pulmonary ([name]) -  home O2 PRN -  continue BiPAP   4. OSA - reports nightly compliance w/ BiPAP  5. Morbid obesity  Body mass index is 31.88 kg/m. - wt  loss advised - long discussion regarding low sodium/ low carb, heart healthy diet   6. Pulmonary Nodules - followed by [name] - recent CT scan 3/25 stable, annual screening advised.   F/u in 3 months w/ Dr. Odelia Gage, PA-C  3:09 PM

## 2021-04-01 ENCOUNTER — Telehealth: Payer: Self-pay | Admitting: *Deleted

## 2021-04-01 NOTE — Progress Notes (Signed)
Cardiology Clinic Note   Patient Name: Robert Church Date of Encounter: 04/03/2021  Primary Care Provider:  Janith Lima, MD Primary Cardiologist:  Quay Burow, MD  Patient Profile    Robert Church 65 year old male presents the clinic today for follow-up evaluation of his nonischemic cardiomyopathy and chronic combined systolic diastolic CHF  Past Medical History    Past Medical History:  Diagnosis Date  . Adenomatous colon polyp   . Allergy   . Anal fissure   . Asthma   . Bronchitis   . CHF (congestive heart failure) ([place])   . COPD (chronic obstructive pulmonary disease) ([place])   . Emphysema lung ([place])   . GERD (gastroesophageal reflux disease)   . Hyperlipidemia   . Hypertension   . OSA (obstructive sleep apnea)   . Pneumonia   . Sinusitis   . SOB (shortness of breath)    Past Surgical History:  Procedure Laterality Date  . LEFT HEART CATH AND CORONARY ANGIOGRAPHY N/A 04/19/2017   Procedure: Left Heart Cath and Coronary Angiography;  Surgeon: Belva Crome, MD;  Location: [facility];  Service: Cardiovascular;  Laterality: N/A;  . TONSILLECTOMY      Allergies  Allergies  Allergen Reactions  . Clindamycin Other (See Comments)    Tongue swelling  . Doxycycline     Severe stomach upset per patient  . E-Mycin [Erythromycin Base] Swelling  . Penicillins Swelling and Other (See Comments)    Childhood rxn--MD stated he "almost died" Has patient had a PCN reaction causing immediate rash, facial/tongue/throat swelling, SOB or lightheadedness with hypotension:Yes Has patient had a PCN reaction causing severe rash involving mucus membranes or skin necrosis:unsure Has patient had a PCN reaction that required hospitalization:unsure Has patient had a PCN reaction occurring within the last 10 years:No If all of the above answers are "NO", then may proceed with Cephalosporin use.    . Rosuvastatin     Other reaction(s): Myalgias (intolerance)     History of Present Illness    Robert Church has a PMH of hypertension, and ICM, chronic combined systolic and diastolic CHF, lower extremity edema, asthma, COPD, GERD, type 2 diabetes, tobacco abuse, morbid obesity, hypertriglyceridemia, chronic low back pain and obesity.  He underwent cardiac catheterization 04/19/2017 which showed normal coronary arteries and an EF of 40% consistent with nonischemic cardiomyopathy.  Echocardiogram 07/07/2018 showed an EF of 30-35%.  He was referred to the advanced heart failure clinic who changed his carvedilol to [facility] and his losartan to [facility].  However, he did not tolerate Entresto and he was placed back on losartan.  He also has obstructive sleep apnea and uses CPAP intermittently.  Previously he has been noted to have class II/III symptoms.  He was seen by Dr. Gwenlyn Found on 01/03/2021.  During that time he was doing well.  He had been admitted 01/18/2020 with fluid volume overload.  Echocardiogram at that time showed an EF of 40-45%.  He diuresed approximately 65 pounds and was discharged weighing 210 pounds.  He was discharged home on torsemide and spironolactone.  Sodium restriction was reviewed.  He continues to smoke.  He was compliant with his daily weights.  He was seen by Ellen Henri PA-C on 03/31/2021.  He returns for follow-up and his weight was down 3 pounds.  His breathing has improved.  He was compliant with his Jardiance and denied side effects.  He had self reduced his spironolactone to 12.5 mg daily.  He reported he felt  dizzy with the 25 mg dosing.  His renal function and potassium were stable at that time.  He presents the clinic today for follow-up evaluation states has been noticing increased dizziness.  He was instructed to start his spironolactone 12.5 mg at night which he plans on starting tonight.  He reports 2 episodes of fainting.  One was on Monday and the other one was on Wednesday.  He does not have dizziness today.  He also reports that  he noted increased dizziness when he initiated use of nicotine patches.  His weight is slightly elevated today at 224 pounds.  He reports he is following a very strict low-sodium diet and has been active mowing grass and doing yard work.  I will order a CBC, BMP and cardiac event monitor.  We will plan follow-up for 8 weeks.  Today he denies chest pain, shortness of breath, lower extremity edema, fatigue, palpitations, melena, hematuria, hemoptysis, diaphoresis, weakness, presyncope, syncope, orthopnea, and PND.     Home Medications    Prior to Admission medications   Medication Sig Start Date End Date Taking? Authorizing Provider  albuterol (PROVENTIL) (2.5 MG/3ML) 0.083% nebulizer solution Take 3 mLs (2.5 mg total) by nebulization every 6 (six) hours as needed for wheezing or shortness of breath. 10/01/20   Collene Gobble, MD  albuterol (VENTOLIN HFA) 108 (90 Base) MCG/ACT inhaler INHALE 2 PUFFS INTO THE LUNGS EVERY 6 HOURS AS NEEDED FOR WHEEZING 02/27/21   Byrum, Rose Fillers, MD  allopurinol (ZYLOPRIM) 300 MG tablet Take 1 tablet (300 mg total) by mouth daily. 11/29/20   Janith Lima, MD  AMBULATORY NON FORMULARY MEDICATION Medication Name: NTG ointment as needed.    [provider]  cholecalciferol (VITAMIN D) 1000 units tablet Take 1,000 Units by mouth daily.    [provider]  fluconazole (DIFLUCAN) 100 MG tablet Take 200 mg (2 tablets) by mouth on day 1, then take 100 mg (1 tablet) by mouth on day 2-14. 03/26/21   Pyrtle, Lajuan Lines, MD  JARDIANCE 10 MG TABS tablet Take 10 mg by mouth every morning. 03/26/21   [provider]  Lidocaine, Anorectal, 5 % CREA Apply topically as needed.    [provider]  losartan (COZAAR) 25 MG tablet Take 0.5 tablets (12.5 mg total) by mouth daily. 12/30/20 06/28/21  Oretha Milch D, MD  metFORMIN (GLUCOPHAGE XR) 750 MG 24 hr tablet Take 1 tablet (750 mg total) by mouth daily with breakfast. 12/09/20   Janith Lima, MD   mometasone (NASONEX) 50 MCG/ACT nasal spray Place 2 sprays into the nose daily. 02/06/21 02/06/22  Biagio Borg, MD  mometasone-formoterol (DULERA) 200-5 MCG/ACT AERO Dulera 200 mcg-5 mcg/actuation HFA aerosol inhaler  INHALE 2 PUFFS BY MOUTH FIRST THING IN THE MORNING AND THEN ANOTHER 2 PUFFS ABOUT 12 HOURS LATER 02/11/21   Collene Gobble, MD  montelukast (SINGULAIR) 10 MG tablet Take 1 tablet (10 mg total) by mouth at bedtime. 02/06/21   Biagio Borg, MD  nicotine (NICODERM CQ - DOSED IN MG/24 HOURS) 21 mg/24hr patch Place 1 patch (21 mg total) onto the skin daily. 03/25/21   Janith Lima, MD  nicotine (NICODERM CQ) 14 mg/24hr patch Place 1 patch (14 mg total) onto the skin daily. 03/25/21   Janith Lima, MD  omeprazole (PRILOSEC) 20 MG capsule Take 1 capsule (20 mg total) by mouth 2 (two) times daily before a meal. 07/28/20   Janith Lima, MD  polyethylene glycol (  MIRALAX / GLYCOLAX) 17 g packet Take 17 g by mouth daily as needed. 12/30/20   Desiree Hane, MD  predniSONE (DELTASONE) 10 MG tablet Take 1 tablet (10 mg total) by mouth daily with breakfast. 12/11/20   Collene Gobble, MD  roflumilast (DALIRESP) 500 MCG TABS tablet Take 1 tablet (500 mcg total) by mouth daily. 03/08/21   Juanito Doom, MD  spironolactone (ALDACTONE) 25 MG tablet Take 12.5 mg by mouth daily.    [provider]  Tiotropium Bromide Monohydrate (SPIRIVA RESPIMAT) 2.5 MCG/ACT AERS Inhale 2.5 mcg into the lungs 2 (two) times daily. 12/11/20   Collene Gobble, MD  torsemide (DEMADEX) 10 MG tablet Take 1 tablet (10 mg total) by mouth daily. 12/31/20   Desiree Hane, MD    Family History    Family History  Problem Relation Age of Onset  . Lung cancer Father   . Brain cancer Father   . Diabetes Maternal Grandmother   . Colon polyps Neg Hx   . Esophageal cancer Neg Hx   . Pancreatic cancer Neg Hx   . Stomach cancer Neg Hx    He indicated that his mother is alive. He indicated that his father is  deceased. He indicated that his maternal grandmother is deceased. He indicated that his maternal grandfather is deceased. He indicated that his paternal grandmother is deceased. He indicated that his paternal grandfather is deceased. He indicated that his daughter is alive. He indicated that the status of his neg hx is unknown.  Social History    Social History   Socioeconomic History  . Marital status: Single    Spouse name: Not on file  . Number of children: Not on file  . Years of education: Not on file  . Highest education level: Not on file  Occupational History  . Occupation: Dealer  Tobacco Use  . Smoking status: Former Smoker    Packs/day: 0.25    Years: 46.00    Pack years: 11.50    Types: Cigarettes  . Smokeless tobacco: Never Used  Vaping Use  . Vaping Use: Never used  Substance and Sexual Activity  . Alcohol use: Yes    Alcohol/week: 3.0 standard drinks    Types: 3 Cans of beer per week    Comment: 3 beers per day  . Drug use: No  . Sexual activity: Yes    Birth control/protection: None  Other Topics Concern  . Not on file  Social History Narrative  . Not on file   Social Determinants of Health   Financial Resource Strain: Not on file  Food Insecurity: Not on file  Transportation Needs: Not on file  Physical Activity: Not on file  Stress: Not on file  Social Connections: Not on file  Intimate Partner Violence: Not on file     Review of Systems    General:  No chills, fever, night sweats or weight changes.  Cardiovascular:  No chest pain, dyspnea on exertion, edema, orthopnea, palpitations, paroxysmal nocturnal dyspnea. Dermatological: No rash, lesions/masses Respiratory: No cough, dyspnea Urologic: No hematuria, dysuria Abdominal:   No nausea, vomiting, diarrhea, bright red blood per rectum, melena, or hematemesis Neurologic:  No visual changes, wkns, changes in mental status. All other systems reviewed and are otherwise negative except as noted  above.  Physical Exam    VS:  BP 110/72 (BP Location: Left Arm, Patient Position: Sitting, Cuff Size: Normal)   Pulse 63   Ht 5\' 10"  (1.778 m)  Wt 224 lb 12.8 oz (102 kg)   SpO2 94%   BMI 32.26 kg/m  , BMI Body mass index is 32.26 kg/m. GEN: Well nourished, well developed, in no acute distress. HEENT: normal. Neck: Supple, no JVD, carotid bruits, or masses. Cardiac: RRR, no murmurs, rubs, or gallops. No clubbing, cyanosis, edema.  Radials/DP/PT 2+ and equal bilaterally.  Respiratory:  Respirations regular and unlabored, clear to auscultation bilaterally. GI: Soft, nontender, nondistended, BS + x 4. MS: no deformity or atrophy. Skin: warm and dry, no rash. Neuro:  Strength and sensation are intact. Psych: Normal affect.  Accessory Clinical Findings    Recent Labs: 07/10/2020: TSH 0.61 12/17/2020: B Natriuretic Peptide 1,374.9 12/25/2020: Magnesium 2.3 03/25/2021: ALT 17; BUN 25; Creatinine, Ser 1.18; Hemoglobin 15.9; Platelets 190.0; Potassium 4.8; Sodium 140   Recent Lipid Panel    Component Value Date/Time   CHOL 209 (H) 03/25/2021 1519   TRIG 216.0 (H) 03/25/2021 1519   HDL 45.20 03/25/2021 1519   CHOLHDL 5 03/25/2021 1519   VLDL 43.2 (H) 03/25/2021 1519   LDLCALC 108 (H) 07/10/2020 1035   LDLDIRECT 150.0 03/25/2021 1519    ECG personally reviewed by me today-sinus rhythm with first-degree AV block with premature atrial complexes left axis deviation right bundle branch block LVH with QRS widening and repolarization abnormality- No acute changes  Echocardiogram 12/18/2020 IMPRESSIONS    1. Left ventricular ejection fraction, by estimation, is 40 to 45%. The  left ventricle has mildly decreased function. The left ventricle  demonstrates global hypokinesis. The left ventricular internal cavity size  was moderately dilated. There is mild  left ventricular hypertrophy. Left ventricular diastolic parameters are  consistent with Grade II diastolic dysfunction  (pseudonormalization).  Elevated left atrial pressure.  2. Right ventricular systolic function is mildly reduced. The right  ventricular size is severely enlarged. There is moderately elevated  pulmonary artery systolic pressure. The estimated right ventricular  systolic pressure is 40.1 mmHg.  3. Left atrial size was mildly dilated.  4. Right atrial size was mildly dilated.  5. The mitral valve is normal in structure. No evidence of mitral valve  regurgitation.  6. The aortic valve was not well visualized. Aortic valve regurgitation  is not visualized. No aortic stenosis is present.  7. There is mild dilatation of the ascending aorta, measuring 37 mm.  8. The inferior vena cava is dilated in size with <50% respiratory  variability, suggesting right atrial pressure of 15 mmHg.  Assessment & Plan   1.  Dizziness, syncope -reports 2 episodes of fainting last week.  Reports that he fainted on Monday and again on Wednesday.  He stopped his torsemide at that time.  Weight today up 2 pounds 224 pounds.  Continues to be" swimmy headed" Cardiac event monitor Order CBC, BMP  Essential hypertension-BP today 110/72.  Well-controlled at home. Continue losartan, spironolactone, torsemide Heart healthy low-sodium diet-salty 6 given Increase physical activity as tolerated  Obstructive sleep apnea-reports compliance with BiPAP. Continue BiPAP use Continue weight loss  Nonischemic cardiomyopathy-euvolemic today.   No increased DOE or activity intolerance.  Recently followed up with advanced heart failure clinic and had lost another 3 pounds.  Reported compliance with his medications.  Intolerant of Entresto.  Echocardiogram 12/18/2020 showed EF 40-45% Continue losartan, spironolactone, torsemide, Jardiance Heart healthy low-sodium diet-salty 6 given Increase physical activity as tolerated Daily weights  Tobacco use-reports his last cigarette was last week.  He also reports that around the  time he started using the Nicorette patch  he started noticing dizziness. Stop NicoDerm patch Sugar-free candy/sugar-free gum  Disposition: Follow-up with Dr. Gwenlyn Found after cardiac event monitor.  Jossie Ng. Tomaz Janis NP-C    04/03/2021, 4:11 PM [facility] [address] Office [phone] Fax [phone]  Notice: This dictation was prepared with Dragon dictation along with smaller phrase technology. Any transcriptional errors that result from this process are unintentional and may not be corrected upon review.  I spent 15 minutes examining this patient, reviewing medications, and using patient centered shared decision making involving her cardiac care.  Prior to her visit I spent greater than 20 minutes reviewing her past medical history,  medications, and prior cardiac tests.

## 2021-04-01 NOTE — Telephone Encounter (Signed)
Patient has been scheduled to see Dr Eliezer Mccoy at [facility] for his persistent anal fissure on 05/05/21 as per [facility] scheduler.

## 2021-04-02 MED ORDER — TORSEMIDE 10 MG PO TABS: 10.0000 mg | ORAL_TABLET | Freq: Every day | ORAL | 3 refills | Status: DC

## 2021-04-02 MED ORDER — LOSARTAN POTASSIUM 25 MG PO TABS: 12.5000 mg | ORAL_TABLET | Freq: Every day | ORAL | 3 refills | Status: DC

## 2021-04-02 NOTE — Telephone Encounter (Signed)
Called pt to follow up on dizziness and headache. Patient advised to take spironolactone at bedtime instead of morning to help with dizziness. Pt said he no longer has a headache. Pt aware to contact office if dizziness persists.

## 2021-04-03 ENCOUNTER — Telehealth: Payer: Self-pay | Admitting: Internal Medicine

## 2021-04-03 ENCOUNTER — Encounter: Payer: Self-pay | Admitting: *Deleted

## 2021-04-03 ENCOUNTER — Other Ambulatory Visit: Payer: Self-pay

## 2021-04-03 ENCOUNTER — Other Ambulatory Visit: Payer: Self-pay | Admitting: General Practice

## 2021-04-03 ENCOUNTER — Encounter: Payer: Self-pay | Admitting: General Practice

## 2021-04-03 ENCOUNTER — Ambulatory Visit (INDEPENDENT_AMBULATORY_CARE_PROVIDER_SITE_OTHER): Payer: 59 | Admitting: General Practice

## 2021-04-03 ENCOUNTER — Ambulatory Visit: Payer: 59

## 2021-04-03 VITALS — BP 110/72 | HR 63 | Ht 70.0 in | Wt 224.8 lb

## 2021-04-03 DIAGNOSIS — R55 Syncope and collapse: Secondary | ICD-10-CM

## 2021-04-03 DIAGNOSIS — I10 Essential (primary) hypertension: Secondary | ICD-10-CM | POA: Diagnosis not present

## 2021-04-03 DIAGNOSIS — G4733 Obstructive sleep apnea (adult) (pediatric): Secondary | ICD-10-CM | POA: Diagnosis not present

## 2021-04-03 DIAGNOSIS — R42 Dizziness and giddiness: Secondary | ICD-10-CM

## 2021-04-03 DIAGNOSIS — I428 Other cardiomyopathies: Secondary | ICD-10-CM | POA: Diagnosis not present

## 2021-04-03 DIAGNOSIS — F17210 Nicotine dependence, cigarettes, uncomplicated: Secondary | ICD-10-CM

## 2021-04-03 DIAGNOSIS — Z79899 Other long term (current) drug therapy: Secondary | ICD-10-CM

## 2021-04-03 NOTE — Telephone Encounter (Signed)
Pt stated that he had a moment of blacking out on Monday and another one of Tuesday. He wanted to know if its due to the medication that he is on or if it it's happened due to his blood sugar being too low or too high. Please advise.  He mentioned that he has an OV with his cardiologist today to discuss but would like your input as well.   Pt is also interested in getting a glucose monitor. Ok to send?

## 2021-04-03 NOTE — Telephone Encounter (Signed)
Called pt, LVM.   

## 2021-04-03 NOTE — Telephone Encounter (Signed)
Patient called and is requesting a call back to discuss his medications that Dr.Jones recently prescribed him. He can be reached at [phone]

## 2021-04-03 NOTE — Progress Notes (Signed)
Patient ID: Robert Church, male   DOB: 11-Sep-1956, 65 y.o.   MRN: [id number] Patient enrolled for [employer] to ship a 7 day holter monitor to his home. Letter with instructions mailed to patient.

## 2021-04-03 NOTE — Patient Instructions (Addendum)
Medication Instructions:  HOLD [place] *If you need a refill on your cardiac medications before your next appointment, please call your pharmacy*  Lab Work:     CBC AND BMET TODAY      Testing/Procedures: Your physician has requested you wear a ZIO patch monitor for 7 days-SEE ATTACHED   Special Instructions TAKE AND LOG YOUR WEIGHT DAILY  PLEASE READ AND FOLLOW SALTY 6-ATTACHED-1,855m daily  Follow-Up: Your next appointment:  6-8 week(s) In Person with JQuay Burow MD OR IF UNAVAILABLE JNaples FNP-C   At [facility] you and your health needs are our priority.  As part of our continuing mission to provide you with exceptional heart care, we have created designated Provider Care Teams.  These Care Teams include your primary Cardiologist (physician) and Advanced Practice Providers (APPs -  Physician Assistants and Nurse Practitioners) who all work together to provide you with the care you need, when you need it.  We recommend signing up for the patient portal called "MyChart".  Sign up information is provided on this After Visit Summary.  MyChart is used to connect with patients for Virtual Visits (Telemedicine).  Patients are able to view lab/test results, encounter notes, upcoming appointments, etc.  Non-urgent messages can be sent to your provider as well.   To learn more about what you can do with MyChart, go to [email or web address]     ZIO AT Long term monitor-Live Telemetry  Your physician has requested you wear a ZIO patch monitor for 7 days.  This is a single patch monitor. [employer] supplies one patch monitor per enrollment. Additional stickers are not available.  Please do not apply patch if you will be having a Nuclear Stress Test, Echocardiogram, Cardiac CT, MRI, or Chest Xray during the time frame you would be wearing the monitor. The patch cannot be worn during these tests. You cannot remove and re-apply the ZIO AT patch monitor.   Your ZIO patch  monitor will be sent Fed Ex from [employer] to your home address. The monitor may also be mailed to a PO BOX if home delivery is not available. It may take 3-5 days to receive your monitor after you have been enrolled.  Once you have received you monitor, please review enclosed instructions. Your monitor has already been registered assigning a specific monitor serial # to you.   Applying the monitor  Shave hair from upper left chest.  Hold abrader disc by orange tab. Rub abrader in 40 strokes over left upper chest as indicated in your monitor instructions.  Clean area with 4 enclosed alcohol pads. Use all pads to ensure the area is cleaned thoroughly. Let dry.  Apply patch as indicated in monitor instructions. Patch will be placed under collarbone on left side of chest with arrow pointing upward.  Rub patch adhesive wings for 2 minutes. Remove the white label marked "1". Remove the white label marked "2". Rub patch adhesive wings for 2 additional minutes.  While looking in a mirror, press and release button in center of patch. A small green light will flash 3-4 times. This will be your only indicator the monitor has been turned on.  Do not shower for the first 24 hours. You may shower after the first 24 hours.  Press the button if you feel a symptom. You will hear a small click. Record Date, Time and Symptom in the Patient Log.   Starting the Gateway  In your kit there is a sOptometrist  the size of a cellphone. This is Airline pilot. It transmits all your recorded data to [facility]. This box must stay within 10 feet of you at all times. Open the box and push the * button. There will be a light that blinks orange and then green a few times. When the light stops blinking, the Gateway is connected to the ZIO patch.  Call [employer] at [phone] to confirm your monitor is transmitting.   Returning your monitor  Remove your patch and place it inside the [place]. In the lower half of  the Gateway there is a white bag with prepaid postage on it. Place Gateway in bag and seal. Mail package back to [place] as soon as possible. Your physician should have your final report approximately 7 days after you have mailed back your monitor.   Call [facility] at [phone] if you have questions regarding your ZIO AT patch monitor. Call them immediately if you see an orange light blinking on your monitor.  If your monitor falls off in less than 4 days contact our Monitor department at [phone]. If your monitor becomes loose or falls off after 4 days call [employer] at [phone] for suggestions on securing your monitor.

## 2021-04-03 NOTE — Progress Notes (Unsigned)
Patient enrolled for [employer] to ship a 7 day cardiac event monitor to his home.

## 2021-04-04 ENCOUNTER — Telehealth: Payer: Self-pay | Admitting: General Practice

## 2021-04-04 LAB — CBC
Hematocrit: 45.7 % (ref 37.5–51.0)
Hemoglobin: 16 g/dL (ref 13.0–17.7)
MCH: 33.1 pg — ABNORMAL HIGH (ref 26.6–33.0)
MCHC: 35 g/dL (ref 31.5–35.7)
MCV: 94 fL (ref 79–97)
Platelets: 180 10*3/uL (ref 150–450)
RBC: 4.84 x10E6/uL (ref 4.14–5.80)
RDW: 13.6 % (ref 11.6–15.4)
WBC: 9 10*3/uL (ref 3.4–10.8)

## 2021-04-04 LAB — BASIC METABOLIC PANEL
BUN/Creatinine Ratio: 19 (ref 10–24)
BUN: 17 mg/dL (ref 8–27)
CO2: 22 mmol/L (ref 20–29)
Calcium: 9.1 mg/dL (ref 8.6–10.2)
Chloride: 98 mmol/L (ref 96–106)
Creatinine, Ser: 0.89 mg/dL (ref 0.76–1.27)
Glucose: 117 mg/dL — ABNORMAL HIGH (ref 65–99)
Potassium: 4.6 mmol/L (ref 3.5–5.2)
Sodium: 138 mmol/L (ref 134–144)
eGFR: 97 mL/min/{1.73_m2} (ref >59)

## 2021-04-04 NOTE — Telephone Encounter (Signed)
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 These are available in epic.   Patient states that the chiropractor did not want to do any adjustment to his neck due to this. Patient would like to know if Dr. Gwenlyn Found wanted to evaluate this or not or if Dr. Gwenlyn Found recommended any testing.   Advised patient that I would forward message to Dr. Gwenlyn Found for him to review and advise. Patient verbalized understanding.

## 2021-04-04 NOTE — Telephone Encounter (Signed)
Patient called to say that his chiropractor told him his calcium build up in his neck. Patient is calling see if a ultra sound need to be done, it was calcium build up in the arteries. Please advise

## 2021-04-07 ENCOUNTER — Telehealth: Payer: Self-pay | Admitting: Emergency Medicine

## 2021-04-07 NOTE — Telephone Encounter (Signed)
Called patient but he did not answer. Left message for patient to call back.  °

## 2021-04-07 NOTE — Telephone Encounter (Signed)
Called and spoke with patient. He stated he completed a covid test at home and it was negative. Did advise him that RB was out of the office until next week. He was upset that [facility] would not send in the Levaquin for him. Stated that he will call his PCP tomorrow to get the Levaquin and keep the OV next week with RB.  Nothing further needed at time of call.

## 2021-04-07 NOTE — Telephone Encounter (Signed)
Primary Pulmonologist: Byrum Last office visit and with whom: 02/11/21 What do we see them for (pulmonary problems): COPD, group D Last OV assessment/plan:  Assessment & Plan Note by Collene Gobble, MD at 02/12/2021 9:30 AM  Author: Collene Gobble, MD Author Type: Physician Filed: 02/12/2021 9:32 AM  Note Status: Written Cosign: Cosign Not Required Encounter Date: 02/11/2021  Problem: COPD, group D, by GOLD 2017 classification [facility])  Editor: Collene Gobble, MD (Physician)               Hospitalized for respiratory failure in setting of exacerbation since last visit.  He was discharged on BiPAP and is benefiting, tolerating.  Plan to continue same.  He did not feel that he benefited from [place].  He is currently on Spiriva, [name] was not restarted but we will plan to do so now.  He remains on prednisone 10 mg daily, Daliresp, Singulair, not currently on Mucinex  Continue Spiriva as you have been taking it. We will restart Dulera 2 puffs twice a day.  Rinse and gargle after you use it. You can use your albuterol either 1 nebulizer treatment or 2 puffs up to every 4 hours if needed for shortness of breath, chest tightness, wheezing. Continue your prednisone 10 mg daily. Continue your Singulair and your Daliresp as you have been taking them. Continue your BiPAP every night as you have been using it. Continue oxygen with exertion as you have been using it. Follow [name] in 2 months so that we can discuss how you are doing after the addition of Dulera.       Patient Instructions by Collene Gobble, MD at 02/11/2021 3:15 PM  Author: Collene Gobble, MD Author Type: Physician Filed: 02/11/2021 3:44 PM  Note Status: Signed Cosign: Cosign Not Required Encounter Date: 02/11/2021  Editor: Collene Gobble, MD (Physician)               Continue Spiriva as you have been taking it. We will restart Dulera 2 puffs twice a day.  Rinse and gargle after you use it. You can use your albuterol  either 1 nebulizer treatment or 2 puffs up to every 4 hours if needed for shortness of breath, chest tightness, wheezing. Continue your prednisone 10 mg daily. Continue your Singulair and your Daliresp as you have been taking them. Congratulations on stopping smoking!! Continue your BiPAP every night as you have been using it. Continue oxygen with exertion as you have been using it. Follow [name] in 2 months so that we can discuss how you are doing after the addition of Dulera.        Instructions  Continue Spiriva as you have been taking it. We will restart Dulera 2 puffs twice a day.  Rinse and gargle after you use it. You can use your albuterol either 1 nebulizer treatment or 2 puffs up to every 4 hours if needed for shortness of breath, chest tightness, wheezing. Continue your prednisone 10 mg daily. Continue your Singulair and your Daliresp as you have been taking them. Congratulations on stopping smoking!! Continue your BiPAP every night as you have been using it. Continue oxygen with exertion as you have been using it. Follow [name] in 2 months so that we can discuss how you are doing after the addition of Dulera.       Reason for call: Patient states he feels he has bronchitis and is requesting that Levaquin be called into his pharmacy.  He  has been affected by the pollen over the last 3-4 days (Friday), states he is having cough with clear to white mucous.  He is using his Spiriva and Dulera as prescribed.  He went to [employer], walked up 2 isles and had to stop to catch his breath.  He is using his albuterol nebulizer 4 times per day and his albuterol inhaler 3 times per day.  He has oxygen at home that he uses as needed, 6L and has been using it since Friday.  On RA, his sats have been 90-94%.  He denies:  Fever, chills, body aches, sick contacts.  He has not received his covid vaccines or the flu vaccines.  Patient is requesting Levaquin.  Sarah, please advise.  Thank  you.  (examples of things to ask: : When did symptoms start? Fever? Cough? Productive? Color to sputum? More sputum than usual? Wheezing? Have you needed increased oxygen? Are you taking your respiratory medications? What over the counter measures have you tried?)  Allergies  Allergen Reactions  . Clindamycin Other (See Comments)    Tongue swelling  . Doxycycline     Severe stomach upset per patient  . E-Mycin [Erythromycin Base] Swelling  . Penicillins Swelling and Other (See Comments)    Childhood rxn--MD stated he "almost died" Has patient had a PCN reaction causing immediate rash, facial/tongue/throat swelling, SOB or lightheadedness with hypotension:Yes Has patient had a PCN reaction causing severe rash involving mucus membranes or skin necrosis:unsure Has patient had a PCN reaction that required hospitalization:unsure Has patient had a PCN reaction occurring within the last 10 years:No If all of the above answers are "NO", then may proceed with Cephalosporin use.    . Rosuvastatin     Other reaction(s): Myalgias (intolerance)    Immunization History  Administered Date(s) Administered  . Tdap 12/03/2011

## 2021-04-08 ENCOUNTER — Telehealth: Payer: Self-pay | Admitting: General Practice

## 2021-04-08 ENCOUNTER — Telehealth: Payer: Self-pay | Admitting: Internal Medicine

## 2021-04-08 ENCOUNTER — Encounter: Payer: Self-pay | Admitting: Nurse Practitioner

## 2021-04-08 ENCOUNTER — Encounter: Payer: Self-pay | Admitting: Cardiovascular Disease

## 2021-04-08 ENCOUNTER — Ambulatory Visit (INDEPENDENT_AMBULATORY_CARE_PROVIDER_SITE_OTHER): Payer: 59

## 2021-04-08 DIAGNOSIS — R55 Syncope and collapse: Secondary | ICD-10-CM | POA: Diagnosis not present

## 2021-04-08 DIAGNOSIS — R42 Dizziness and giddiness: Secondary | ICD-10-CM

## 2021-04-08 NOTE — Telephone Encounter (Signed)
Patient wondering if we can send in levofloxacin [facility]) tablet 500 mg for him. He has been working on lawns and thinks he has bronchitis. Denies appt at this time
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
## 2021-04-08 NOTE — Telephone Encounter (Signed)
Patient calling back to follow up.

## 2021-04-08 NOTE — Telephone Encounter (Signed)
Called patient with the results.  He has an appointment in June 06/08 with Dr.Berry and can discuss further treatment. Patient then mentions he has chest pains, reviewed with patient that last note from [place] with the same symptoms as mentioned in the visit.  Advised patient to wear monitor, it was ordered and mailed out- patient aware to call back if any other concerns.   Patient verbalized understanding.

## 2021-04-08 NOTE — Telephone Encounter (Signed)
Pt c/o Syncope: STAT if syncope occurred within 30 minutes and pt complains of lightheadedness High Priority if episode of passing out, completely, today or in last 24 hours   1. Did you pass out today? Passed out but pt can hear everything  2. When is the last time you passed out? Today 1-2pm  3. Has this occurred multiple times? 3rd time  4. Did you have any symptoms prior to passing out?  When pt coughs he gets lightheaded

## 2021-04-09 ENCOUNTER — Other Ambulatory Visit: Payer: Self-pay

## 2021-04-09 ENCOUNTER — Ambulatory Visit (INDEPENDENT_AMBULATORY_CARE_PROVIDER_SITE_OTHER): Payer: 59

## 2021-04-09 ENCOUNTER — Ambulatory Visit (INDEPENDENT_AMBULATORY_CARE_PROVIDER_SITE_OTHER): Payer: 59 | Admitting: Internal Medicine

## 2021-04-09 VITALS — BP 100/80 | HR 94 | Temp 98.2°F | Ht 70.0 in | Wt 227.0 lb

## 2021-04-09 DIAGNOSIS — E118 Type 2 diabetes mellitus with unspecified complications: Secondary | ICD-10-CM

## 2021-04-09 DIAGNOSIS — J441 Chronic obstructive pulmonary disease with (acute) exacerbation: Secondary | ICD-10-CM

## 2021-04-09 DIAGNOSIS — R55 Syncope and collapse: Secondary | ICD-10-CM

## 2021-04-09 DIAGNOSIS — I959 Hypotension, unspecified: Secondary | ICD-10-CM | POA: Diagnosis not present

## 2021-04-09 DIAGNOSIS — R42 Dizziness and giddiness: Secondary | ICD-10-CM | POA: Diagnosis not present

## 2021-04-09 DIAGNOSIS — I6529 Occlusion and stenosis of unspecified carotid artery: Secondary | ICD-10-CM | POA: Insufficient documentation

## 2021-04-09 LAB — CBC WITH DIFFERENTIAL/PLATELET
Basophils Absolute: 0 10*3/uL (ref 0.0–0.1)
Basophils Relative: 0.3 % (ref 0.0–3.0)
Eosinophils Absolute: 0 10*3/uL (ref 0.0–0.7)
Eosinophils Relative: 0.3 % (ref 0.0–5.0)
HCT: 45.3 % (ref 39.0–52.0)
Hemoglobin: 15.6 g/dL (ref 13.0–17.0)
Lymphocytes Relative: 6.9 % — ABNORMAL LOW (ref 12.0–46.0)
Lymphs Abs: 0.6 10*3/uL — ABNORMAL LOW (ref 0.7–4.0)
MCHC: 34.4 g/dL (ref 30.0–36.0)
MCV: 97.9 fl (ref 78.0–100.0)
Monocytes Absolute: 0.6 10*3/uL (ref 0.1–1.0)
Monocytes Relative: 6.3 % (ref 3.0–12.0)
Neutro Abs: 7.9 10*3/uL — ABNORMAL HIGH (ref 1.4–7.7)
Neutrophils Relative %: 86.2 % — ABNORMAL HIGH (ref 43.0–77.0)
Platelets: 188 10*3/uL (ref 150.0–400.0)
RBC: 4.63 Mil/uL (ref 4.22–5.81)
RDW: 14.7 % (ref 11.5–15.5)
WBC: 9.2 10*3/uL (ref 4.0–10.5)

## 2021-04-09 LAB — URINALYSIS, ROUTINE W REFLEX MICROSCOPIC
Bilirubin Urine: NEGATIVE
Hgb urine dipstick: NEGATIVE
Ketones, ur: NEGATIVE
Leukocytes,Ua: NEGATIVE
Nitrite: NEGATIVE
RBC / HPF: NONE SEEN (ref 0–?)
Specific Gravity, Urine: 1.01 (ref 1.000–1.030)
Total Protein, Urine: NEGATIVE
Urine Glucose: 100 — AB
Urobilinogen, UA: 0.2 (ref 0.0–1.0)
WBC, UA: NONE SEEN (ref 0–?)
pH: 5 (ref 5.0–8.0)

## 2021-04-09 LAB — BASIC METABOLIC PANEL
BUN: 20 mg/dL (ref 6–23)
CO2: 30 mEq/L (ref 19–32)
Calcium: 9.3 mg/dL (ref 8.4–10.5)
Chloride: 98 mEq/L (ref 96–112)
Creatinine, Ser: 0.98 mg/dL (ref 0.40–1.50)
GFR: 82.57 mL/min (ref >60.00)
Glucose, Bld: 131 mg/dL — ABNORMAL HIGH (ref 70–99)
Potassium: 4.2 mEq/L (ref 3.5–5.1)
Sodium: 135 mEq/L (ref 135–145)

## 2021-04-09 LAB — HEPATIC FUNCTION PANEL
ALT: 17 U/L (ref 0–53)
AST: 16 U/L (ref 0–37)
Albumin: 4 g/dL (ref 3.5–5.2)
Alkaline Phosphatase: 43 U/L (ref 39–117)
Bilirubin, Direct: 0.1 mg/dL (ref 0.0–0.3)
Total Bilirubin: 0.6 mg/dL (ref 0.2–1.2)
Total Protein: 6.5 g/dL (ref 6.0–8.3)

## 2021-04-09 LAB — BRAIN NATRIURETIC PEPTIDE: Pro B Natriuretic peptide (BNP): 156 pg/mL — ABNORMAL HIGH (ref 0.0–100.0)

## 2021-04-09 LAB — GLUCOSE, POCT (MANUAL RESULT ENTRY): POC Glucose: 148 mg/dl — AB (ref 70–99)

## 2021-04-09 MED ORDER — LEVOFLOXACIN 500 MG PO TABS: 500.0000 mg | ORAL_TABLET | Freq: Every day | ORAL | 0 refills | Status: AC

## 2021-04-09 MED ORDER — PREDNISONE 10 MG PO TABS: ORAL_TABLET | ORAL | 0 refills | Status: DC

## 2021-04-09 NOTE — Assessment & Plan Note (Signed)
[place] for formal carotid dopplers,  to f/u any worsening symptoms or concerns

## 2021-04-09 NOTE — Assessment & Plan Note (Signed)
Mild, sats ok, suspect incidental and not likely related to current symptoms, but for levaquin course, predpac, continue inhalers

## 2021-04-09 NOTE — Patient Instructions (Addendum)
Your BP did not fall appreciably with standing, but the BP overall is still low; the blood sugar was 148  Please refrain from driving at all until your symptoms are improved  Please HOLD on taking the aldactone, and losartan  Please continue all other medications as before, including the torsemide for now  Please continue the cardiac event monitor and f/u with Cardiology as you have planned  You will be contacted regarding the referral for: Carotid artery ultrasound testing  Please take all new medication as prescribed - the levaquin and prednisone  Please go to the XRAY Department in the first floor for the x-ray testing  Please go to the LAB at the blood drawing area for the tests to be done  You will be contacted by phone if any changes need to be made immediately.  Otherwise, you will receive a letter about your results with an explanation, but please check with MyChart first.  Please remember to sign up for MyChart if you have not done so, as this will be important to you in the future with finding out test results, communicating by private email, and scheduling acute appointments online when needed.  Please keep your appointments with your other specialists as you may have planned

## 2021-04-09 NOTE — Assessment & Plan Note (Signed)
With recent normal a1c, doubt related to current symptoms, will check CBG today
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
## 2021-04-09 NOTE — Progress Notes (Signed)
Patient ID: Robert Church, male   DOB: [birth year], 65 y.o.   MRN: [id number]        Chief Complaint: dizziness       HPI:  Robert Church is a 65 y.o. male here with c/o vision going black with standing 3 times in the past 2 wks for about 10 sec, fell once without injury, but twice was driving and had to pull of side of the road when he felt it coming on.  Denies HA, fever, chills, n/v/d/c, CP, palpistaiton but has had about the same time asthmatic bronchitis like symptoms with slight prod cough and mild wheezing that tends to happen about this time every year with onset allergy season.  Also has ongoing frequent urination but Denies urinary symptoms such as dysuria, urgency, flank pain, hematuria , and in fact stopped his jardiance the last 2 days as thought this a kind of fluid pill that made him urinate more.  Does have good compliance with torsemide, aldactone and losartan 25.  Does not check CBGs as has no glucometer so not sure if sugars high leading to polyuria , but last a1c was just normal Mar 25 2021.  Incidentally is wearing a cardiac event monitor and plans to f/u with cardiology.  Has had increased allergy nasal symptoms as well with tinnitus in the past 1-2 wks.  Has ongoing mild swelling to distal RLE no change per pt, and no change in diet calories or salt.  Also mentions did see chiropracter with bilateral upper neck pain, and some type of imaging showed calcifications in the carotids.   Declines ECG       Wt Readings from Last 3 Encounters:  04/09/21 227 lb (103 kg)  04/03/21 224 lb 12.8 oz (102 kg)  03/31/21 222 lb 3.2 oz (100.8 kg)   BP Readings from Last 3 Encounters:  04/09/21 100/80  04/03/21 110/72  03/31/21 140/84         Past Medical History:  Diagnosis Date  . Adenomatous colon polyp   . Allergy   . Anal fissure   . Asthma   . Bronchitis   . CHF (congestive heart failure) ([place])   . COPD (chronic obstructive pulmonary disease) ([place])   . Emphysema lung  ([place])   . GERD (gastroesophageal reflux disease)   . Hyperlipidemia   . Hypertension   . OSA (obstructive sleep apnea)   . Pneumonia   . Sinusitis   . SOB (shortness of breath)    Past Surgical History:  Procedure Laterality Date  . LEFT HEART CATH AND CORONARY ANGIOGRAPHY N/A 04/19/2017   Procedure: Left Heart Cath and Coronary Angiography;  Surgeon: Belva Crome, MD;  Location: [facility];  Service: Cardiovascular;  Laterality: N/A;  . TONSILLECTOMY      reports that he has quit smoking. His smoking use included cigarettes. He has a 11.50 pack-year smoking history. He has never used smokeless tobacco. He reports current alcohol use of about 3.0 standard drinks of alcohol per week. He reports that he does not use drugs. family history includes Brain cancer in his father; Diabetes in his maternal grandmother; Lung cancer in his father. Allergies  Allergen Reactions  . Clindamycin Other (See Comments)    Tongue swelling  . Doxycycline     Severe stomach upset per patient  . E-Mycin [Erythromycin Base] Swelling  . Penicillins Swelling and Other (See Comments)    Childhood rxn--MD stated he "almost died" Has patient had a PCN  reaction causing immediate rash, facial/tongue/throat swelling, SOB or lightheadedness with hypotension:Yes Has patient had a PCN reaction causing severe rash involving mucus membranes or skin necrosis:unsure Has patient had a PCN reaction that required hospitalization:unsure Has patient had a PCN reaction occurring within the last 10 years:No If all of the above answers are "NO", then may proceed with Cephalosporin use.    . Rosuvastatin     Other reaction(s): Myalgias (intolerance)   Current Outpatient Medications on File Prior to Visit  Medication Sig Dispense Refill  . albuterol (PROVENTIL) (2.5 MG/3ML) 0.083% nebulizer solution Take 3 mLs (2.5 mg total) by nebulization every 6 (six) hours as needed for wheezing or shortness of breath. 75 mL 12  .  albuterol (VENTOLIN HFA) 108 (90 Base) MCG/ACT inhaler INHALE 2 PUFFS INTO THE LUNGS EVERY 6 HOURS AS NEEDED FOR WHEEZING 18 g 2  . allopurinol (ZYLOPRIM) 300 MG tablet Take 1 tablet (300 mg total) by mouth daily. 90 tablet 1  . AMBULATORY NON FORMULARY MEDICATION Daily    . cholecalciferol (VITAMIN D) 1000 units tablet Take 1,000 Units by mouth daily.    Marland Kitchen JARDIANCE 10 MG TABS tablet Take 10 mg by mouth every morning.    . Lidocaine, Anorectal, 5 % CREA Apply topically as needed.    Marland Kitchen losartan (COZAAR) 25 MG tablet Take 0.5 tablets (12.5 mg total) by mouth daily. 45 tablet 3  . metFORMIN (GLUCOPHAGE XR) 750 MG 24 hr tablet Take 1 tablet (750 mg total) by mouth daily with breakfast. 90 tablet 1  . mometasone (NASONEX) 50 MCG/ACT nasal spray Place 2 sprays into the nose daily. 17 g 5  . mometasone-formoterol (DULERA) 200-5 MCG/ACT AERO Dulera 200 mcg-5 mcg/actuation HFA aerosol inhaler  INHALE 2 PUFFS BY MOUTH FIRST THING IN THE MORNING AND THEN ANOTHER 2 PUFFS ABOUT 12 HOURS LATER 1 each 2  . montelukast (SINGULAIR) 10 MG tablet Take 1 tablet (10 mg total) by mouth at bedtime. 30 tablet 5  . nicotine (NICODERM CQ) 14 mg/24hr patch Place 1 patch (14 mg total) onto the skin daily. 28 patch 0  . omeprazole (PRILOSEC) 20 MG capsule Take 1 capsule (20 mg total) by mouth 2 (two) times daily before a meal. 180 capsule 1  . polyethylene glycol (MIRALAX / GLYCOLAX) 17 g packet Take 17 g by mouth daily as needed. 14 each 0  . predniSONE (DELTASONE) 10 MG tablet Take 1 tablet (10 mg total) by mouth daily with breakfast. 30 tablet 2  . roflumilast (DALIRESP) 500 MCG TABS tablet Take 1 tablet (500 mcg total) by mouth daily. 30 tablet 3  . spironolactone (ALDACTONE) 25 MG tablet Take 12.5 mg by mouth daily.    . Tiotropium Bromide Monohydrate (SPIRIVA RESPIMAT) 2.5 MCG/ACT AERS Inhale 2.5 mcg into the lungs 2 (two) times daily. 4 g 11  . torsemide (DEMADEX) 10 MG tablet Take 1 tablet (10 mg total) by mouth  daily. 90 tablet 3   No current facility-administered medications on file prior to visit.        ROS:  All others reviewed and negative.  Objective        PE:  BP 100/80 (BP Location: Left Arm, Patient Position: Sitting, Cuff Size: Large)   Pulse 94   Temp 98.2 F (36.8 C) (Oral)   Ht 5\' 10"  (1.778 m)   Wt 227 lb (103 kg)   SpO2 94%   BMI 32.57 kg/m  Orthostatic VS - lying 100/80  - 69                                           Standing 110/86   - 70                Constitutional: Pt appears in NAD, non toxic appearing               HENT: Head: NCAT.                Right Ear: External ear normal.                 Left Ear: External ear normal.  TM's bilat benign               Eyes: . Pupils are equal, round, and reactive to light. Conjunctivae and EOM are normal               Nose: without d/c or deformity               Neck: Neck supple. Gross normal ROM               Cardiovascular: Normal rate and regular rhythm.  Unable to appreciate any murmurs               Pulmonary/Chest: Effort normal and breath sounds decresaed without rales but with mild wheezing bilat.                Abd:  Soft, NT, ND, + BS, no organomegaly               Neurological: Pt is alert. At baseline orientation, motor grossly intact               Skin: RLE edema - trace RLE edema               Psychiatric: Pt behavior is normal without agitation , mild nervous only  Micro: none  Cardiac tracings I have personally interpreted today:  none  Pertinent Radiological findings (summarize): none   Lab Results  Component Value Date   WBC 9.2 04/09/2021   HGB 15.6 04/09/2021   HCT 45.3 04/09/2021   PLT 188.0 04/09/2021   GLUCOSE 131 (H) 04/09/2021   CHOL 209 (H) 03/25/2021   TRIG 216.0 (H) 03/25/2021   HDL 45.20 03/25/2021   LDLDIRECT 150.0 03/25/2021   LDLCALC 108 (H) 07/10/2020   ALT 17 04/09/2021   AST 16 04/09/2021   NA 135 04/09/2021   K 4.2 04/09/2021   CL 98 04/09/2021    CREATININE 0.98 04/09/2021   BUN 20 04/09/2021   CO2 30 04/09/2021   TSH 0.61 07/10/2020   PSA 1.7 07/10/2020   INR 0.9 03/25/2021   HGBA1C 6.3 03/25/2021   Assessment/Plan:  Ulysess Witz is a 65 y.o. White or Caucasian [1] male with  has a past medical history of Adenomatous colon polyp, Allergy, Anal fissure, Asthma, Bronchitis, CHF (congestive heart failure) ([place]), COPD (chronic obstructive pulmonary disease) ([place]), Emphysema lung ([place]), GERD (gastroesophageal reflux disease), Hyperlipidemia, Hypertension, OSA (obstructive sleep apnea), Pneumonia, Sinusitis, and SOB (shortness of breath).  COPD exacerbation (HCC) Mild, sats ok, suspect incidental and not likely related to current symptoms, but for levaquin course, predpac, continue inhalers  Type II diabetes mellitus with manifestations ([place]) With recent normal a1c, doubt related to  current symptoms, will check CBG today  Carotid artery calcification Ok for formal carotid dopplers,  to f/u any worsening symptoms or concerns  Postural dizziness with presyncope Etiology unclear, not orthostatic and VSS, cbg ok at 148 today in the office, and recent a1c normal, has cardiac monitor in place, but has had to recently pull off the road driving and markedly symptomatic   Will need to check labs today and cxr, cont f/u cardiac monitor and cardiology, for carotid dopplers, treat the mild copd exacerbation, HOLD aldactone and losartan for now, continue torsemide, and o/w cont current medications, but I had to insist he needs to hold on driving     Hypotension As above  Followup: Return if symptoms worsen or fail to improve.  Cathlean Cower, MD 04/13/2021 6:07 PM [facility]

## 2021-04-10 ENCOUNTER — Telehealth: Payer: Self-pay | Admitting: Cardiovascular Disease

## 2021-04-10 ENCOUNTER — Encounter: Payer: Self-pay | Admitting: Internal Medicine

## 2021-04-10 ENCOUNTER — Ambulatory Visit: Payer: 59

## 2021-04-10 DIAGNOSIS — I4892 Unspecified atrial flutter: Secondary | ICD-10-CM

## 2021-04-10 MED ORDER — ELIQUIS 5 MG PO TABS: 5.0000 mg | ORAL_TABLET | Freq: Two times a day (BID) | ORAL | 3 refills | Status: DC

## 2021-04-10 NOTE — Addendum Note (Signed)
Addended by: Patria Mane A on: 04/10/2021 05:43 PM   Modules accepted: Orders

## 2021-04-10 NOTE — Telephone Encounter (Addendum)
Received call from [employer] to report critical EKG-  1st documentation of Atrial fibrillation, rate 120 at 3:19 pm central time.  Strip to be uploaded. Auto trigger.  No follow up strip.    Called patient to discuss:    Patient reports mowing the grass earlier today and had to stop due to lightheadedness.   He believes this happened at the same time as this was captured.   He reports ongoing SOB with exertion.    He went to PCP yesterday-reports BP was low, told to hold spironolactone + losartan (PCP note in epic). He denies any near-syncopal episodes since Tuesday (last phone note).  Reports starting abx for bronchitis per PCP.  Always on prednisone.   Patient took BP and HR while on the phone:  BP 136/82 HR 59  Reviewed strip with DOD, Dr. Stanford Breed, new atrial flutter.   Recommended starting Eliquis 5 mg BID and have Dr. Gwenlyn Found review tomorrow when in office for follow up.     Patient aware and verbalized understanding.  rx sent to pharmacy.  Strips given to Dr. Gwenlyn Found primary nurse to review tomorrow.   Patient will be in office tomorrow at 1 pm for carotid US.   Called pharmacy to verify if PA needed-no PA needed.

## 2021-04-10 NOTE — Telephone Encounter (Signed)
[employer] called to say that the patient has a critical EKG. Phone call sent to triage

## 2021-04-11 ENCOUNTER — Ambulatory Visit (HOSPITAL_COMMUNITY)
Admission: RE | Admit: 2021-04-11 | Discharge: 2021-04-11 | Disposition: A | Discharge status: Home / Self Care | Payer: 59 | Source: Ambulatory Visit | Attending: Cardiovascular Disease | Admitting: Cardiovascular Disease

## 2021-04-11 ENCOUNTER — Other Ambulatory Visit: Payer: Self-pay

## 2021-04-11 ENCOUNTER — Encounter: Payer: Self-pay | Admitting: Internal Medicine

## 2021-04-11 ENCOUNTER — Telehealth: Payer: Self-pay | Admitting: Internal Medicine

## 2021-04-11 DIAGNOSIS — R55 Syncope and collapse: Secondary | ICD-10-CM | POA: Insufficient documentation

## 2021-04-11 DIAGNOSIS — R42 Dizziness and giddiness: Secondary | ICD-10-CM | POA: Diagnosis present

## 2021-04-11 NOTE — Telephone Encounter (Signed)
Can I see that rhythm strip today in th eoffice

## 2021-04-11 NOTE — Telephone Encounter (Signed)
Patient wondering if he can get a CT scan because of his dizzy spells and headaches. Patient was seen by Dr. Jenny Reichmann on 04.20.22.

## 2021-04-11 NOTE — Telephone Encounter (Signed)
Pt notified to follow up with Ronnald Ramp he stated he will on next office visit 5/19

## 2021-04-11 NOTE — Addendum Note (Signed)
Addended by: Beatrix Fetters on: 04/11/2021 09:33 AM   Modules accepted: Orders

## 2021-04-11 NOTE — Telephone Encounter (Signed)
Ok for pt to address soon  with Dr Ronnald Ramp at his f/u visit that was recommended by me at his 4/20 visit

## 2021-04-11 NOTE — Telephone Encounter (Signed)
Spoke with pt regarding new onset a-flutter and further direction per Dr. Gwenlyn Found. Dr. Gwenlyn Found would like for pt to be seen in a-fib clinic to better advise pt. Contacted a-fib clinic who has an opening on Monday, April 25th at 10am which pt is able to make. Pt states that he is feeling ok this morning, able to get prescription filled yesterday and has began taking eliquis as prescribed. Pt describes that occasionally his head feels "swimmy" and that he is having to sit down for a few minutes for these feelings to go away. Pt also has a carotid ultrasound today that he plans to keep. Will continue to follow up.

## 2021-04-13 ENCOUNTER — Encounter: Payer: Self-pay | Admitting: Internal Medicine

## 2021-04-13 NOTE — Assessment & Plan Note (Signed)
As above.

## 2021-04-14 ENCOUNTER — Other Ambulatory Visit: Payer: Self-pay

## 2021-04-14 ENCOUNTER — Ambulatory Visit (HOSPITAL_BASED_OUTPATIENT_CLINIC_OR_DEPARTMENT_OTHER)
Admission: RE | Admit: 2021-04-14 | Discharge: 2021-04-14 | Disposition: A | Discharge status: Home / Self Care | Payer: 59 | Source: Ambulatory Visit | Attending: Nurse Practitioner | Admitting: Nurse Practitioner

## 2021-04-14 ENCOUNTER — Encounter (HOSPITAL_COMMUNITY): Payer: Self-pay | Admitting: Nurse Practitioner

## 2021-04-14 ENCOUNTER — Ambulatory Visit: Payer: 59 | Admitting: Emergency Medicine

## 2021-04-14 VITALS — BP 122/80 | HR 78 | Ht 70.0 in | Wt 228.6 lb

## 2021-04-14 DIAGNOSIS — Z7901 Long term (current) use of anticoagulants: Secondary | ICD-10-CM | POA: Insufficient documentation

## 2021-04-14 DIAGNOSIS — R600 Localized edema: Secondary | ICD-10-CM | POA: Insufficient documentation

## 2021-04-14 DIAGNOSIS — I4891 Unspecified atrial fibrillation: Secondary | ICD-10-CM | POA: Insufficient documentation

## 2021-04-14 DIAGNOSIS — R55 Syncope and collapse: Secondary | ICD-10-CM | POA: Insufficient documentation

## 2021-04-14 DIAGNOSIS — M545 Low back pain, unspecified: Secondary | ICD-10-CM | POA: Insufficient documentation

## 2021-04-14 DIAGNOSIS — E781 Pure hyperglyceridemia: Secondary | ICD-10-CM | POA: Insufficient documentation

## 2021-04-14 DIAGNOSIS — I442 Atrioventricular block, complete: Secondary | ICD-10-CM | POA: Diagnosis not present

## 2021-04-14 DIAGNOSIS — G4733 Obstructive sleep apnea (adult) (pediatric): Secondary | ICD-10-CM | POA: Insufficient documentation

## 2021-04-14 DIAGNOSIS — I428 Other cardiomyopathies: Secondary | ICD-10-CM | POA: Insufficient documentation

## 2021-04-14 DIAGNOSIS — Z79899 Other long term (current) drug therapy: Secondary | ICD-10-CM | POA: Insufficient documentation

## 2021-04-14 DIAGNOSIS — G8929 Other chronic pain: Secondary | ICD-10-CM | POA: Insufficient documentation

## 2021-04-14 DIAGNOSIS — D6869 Other thrombophilia: Secondary | ICD-10-CM | POA: Diagnosis not present

## 2021-04-14 DIAGNOSIS — J449 Chronic obstructive pulmonary disease, unspecified: Secondary | ICD-10-CM | POA: Insufficient documentation

## 2021-04-14 DIAGNOSIS — I5042 Chronic combined systolic (congestive) and diastolic (congestive) heart failure: Secondary | ICD-10-CM | POA: Insufficient documentation

## 2021-04-14 DIAGNOSIS — K219 Gastro-esophageal reflux disease without esophagitis: Secondary | ICD-10-CM | POA: Insufficient documentation

## 2021-04-14 DIAGNOSIS — I480 Paroxysmal atrial fibrillation: Secondary | ICD-10-CM | POA: Diagnosis not present

## 2021-04-14 DIAGNOSIS — Z87891 Personal history of nicotine dependence: Secondary | ICD-10-CM | POA: Insufficient documentation

## 2021-04-14 DIAGNOSIS — Z7984 Long term (current) use of oral hypoglycemic drugs: Secondary | ICD-10-CM | POA: Insufficient documentation

## 2021-04-14 NOTE — Progress Notes (Signed)
Monitor was extended to 30 days per Roderic Palau, NP. [employer] was notified.

## 2021-04-14 NOTE — Progress Notes (Signed)
Primary Care Physician: Janith Lima, MD Referring Physician: Dr. Kennon Holter office  Cardiology: Dr. Gwenlyn Found AHF- Dr. Jannet Askew Robert Church is a 65 y.o. male  h/o hypertension, and NICM, chronic combined systolic and diastolic CHF, lower extremity edema, asthma, COPD, GERD, type 2 diabetes, tobacco abuse, morbid obesity, hypertriglyceridemia, chronic low back pain and obesity.  He underwent cardiac catheterization 04/19/2017 which showed normal coronary arteries and an EF of 40% consistent with nonischemic cardiomyopathy.  Echocardiogram 07/07/2018 showed an EF of 30-35%.  He was referred to the advanced heart failure clinic who changed his carvedilol to [facility] and his losartan to [facility].  However, he did not tolerate Entresto and he was placed back on losartan.  He also has obstructive sleep apnea and uses CPAP intermittently.  Previously he has been noted to have class II/III symptoms.  He was seen by Dr. Gwenlyn Found on 01/03/2021.  During that time he was doing well.  He had been admitted 01/18/2020 with fluid volume overload.  Echocardiogram at that time showed an EF of 40-45%.  He diuresed approximately significant amt of weight  and was discharged weighing 210 pounds.  He was discharged home on torsemide and spironolactone.  Sodium restriction was reviewed.  He continued to smoke.  He was compliant with his daily weights.  He was seen by Ellen Henri PA-C on 03/31/2021.  He returned for follow-up and his weight was down 3 pounds.  His breathing had improved.  He was compliant with his Jardiance and denied side effects.  He had self reduced his spironolactone to 12.5 mg daily.  He reported he felt dizzy with the 25 mg dosing.  His renal function and potassium were stable at that time.  Referred to afib clinic by Dr. Kennon Holter office  as a 1 week Preventice  Zio patch   placed last week( for one week)   for c/o of presyncope/dizziness  and showed one episode of  afib with v rates in the low  120's of short duration from  which pt was not  Asymptomatic, no dizziness or presyncope reported at the time of arrhythmia.  He was started on eliquis 5 mg bid. He is not on BB as he did not tolerate bisoprol which was  tried in  the past.    He reports that he has had dizziness and presyncope for the last several weeks and stopped his torsemide, losartan and spironolactone. He blames symptoms specifically on the torsemide. He states that it gives him a sharp pain in the back of the head, dizziness and feeling of syncope to the point his vision goes black but he does not pass out. He noted these episodes on a Mon/Wed and the following Tuesday earlier this month. Since being off the torsemide he has not noted any further symptoms.    He also reports that he had several similar episodes after being started on torsemide last December. One episode he did have syncope and did hit his head with laceration. He did not seek medical care. He did have a Caroid scan with Dr.Berry that did not show any significant carotid disease.   He is very concerned being off diuretic as he fears he may end back in the hospital with fluid overload, but he is fearful to take torsemide. 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 He feels his weight is stable at this point.   Today, he denies symptoms of palpitations, chest pain, shortness  of breath, orthopnea, PND, lower extremity edema, dizziness, presyncope, syncope, or neurologic sequela. The patient is tolerating medications without difficulties and is otherwise without complaint today.   Past Medical History:  Diagnosis Date  . Adenomatous colon polyp   . Allergy   . Anal fissure   . Asthma   . Bronchitis   . CHF (congestive heart failure) ([place])   . COPD (chronic obstructive pulmonary disease) ([place])   . Emphysema lung ([place])   . GERD (gastroesophageal reflux disease)   . Hyperlipidemia   . Hypertension   . OSA  (obstructive sleep apnea)   . Pneumonia   . Sinusitis   . SOB (shortness of breath)    Past Surgical History:  Procedure Laterality Date  . LEFT HEART CATH AND CORONARY ANGIOGRAPHY N/A 04/19/2017   Procedure: Left Heart Cath and Coronary Angiography;  Surgeon: Belva Crome, MD;  Location: [place] [facility];  Service: Cardiovascular;  Laterality: N/A;  . TONSILLECTOMY      Current Outpatient Medications  Medication Sig Dispense Refill  . albuterol (PROVENTIL) (2.5 MG/3ML) 0.083% nebulizer solution Take 3 mLs (2.5 mg total) by nebulization every 6 (six) hours as needed for wheezing or shortness of breath. 75 mL 12  . albuterol (VENTOLIN HFA) 108 (90 Base) MCG/ACT inhaler INHALE 2 PUFFS INTO THE LUNGS EVERY 6 HOURS AS NEEDED FOR WHEEZING 18 g 2  . allopurinol (ZYLOPRIM) 300 MG tablet Take 1 tablet (300 mg total) by mouth daily. 90 tablet 1  . AMBULATORY NON FORMULARY MEDICATION Daily    . apixaban (ELIQUIS) 5 MG TABS tablet Take 1 tablet (5 mg total) by mouth 2 (two) times daily. 60 tablet 3  . cholecalciferol (VITAMIN D) 1000 units tablet Take 1,000 Units by mouth daily.    Marland Kitchen levofloxacin (LEVAQUIN) 500 MG tablet Take 1 tablet (500 mg total) by mouth daily for 10 days. 10 tablet 0  . Lidocaine, Anorectal, 5 % CREA Apply topically as needed.    . metFORMIN (GLUCOPHAGE XR) 750 MG 24 hr tablet Take 1 tablet (750 mg total) by mouth daily with breakfast. 90 tablet 1  . mometasone-formoterol (DULERA) 200-5 MCG/ACT AERO Dulera 200 mcg-5 mcg/actuation HFA aerosol inhaler  INHALE 2 PUFFS BY MOUTH FIRST THING IN THE MORNING AND THEN ANOTHER 2 PUFFS ABOUT 12 HOURS LATER 1 each 2  . montelukast (SINGULAIR) 10 MG tablet Take 1 tablet (10 mg total) by mouth at bedtime. 30 tablet 5  . nicotine (NICODERM CQ) 14 mg/24hr patch Place 1 patch (14 mg total) onto the skin daily. 28 patch 0  . omeprazole (PRILOSEC) 20 MG capsule Take 1 capsule (20 mg total) by mouth 2 (two) times daily before a meal. 180 capsule 1   . polyethylene glycol (MIRALAX / GLYCOLAX) 17 g packet Take 17 g by mouth daily as needed. 14 each 0  . predniSONE (DELTASONE) 10 MG tablet Take 1 tablet (10 mg total) by mouth daily with breakfast. 30 tablet 2  . Tiotropium Bromide Monohydrate (SPIRIVA RESPIMAT) 2.5 MCG/ACT AERS Inhale 2.5 mcg into the lungs 2 (two) times daily. 4 g 11  . JARDIANCE 10 MG TABS tablet Take 10 mg by mouth every morning. (Patient not taking: Reported on 04/14/2021)    . losartan (COZAAR) 25 MG tablet Take 0.5 tablets (12.5 mg total) by mouth daily. (Patient not taking: Reported on 04/14/2021) 45 tablet 3  . spironolactone (ALDACTONE) 25 MG tablet Take 12.5 mg by mouth daily. (Patient not taking: Reported on 04/14/2021)    .  torsemide (DEMADEX) 10 MG tablet Take 1 tablet (10 mg total) by mouth daily. (Patient not taking: Reported on 04/14/2021) 90 tablet 3   No current facility-administered medications for this encounter.    Allergies  Allergen Reactions  . Clindamycin Other (See Comments)    Tongue swelling  . Doxycycline     Severe stomach upset per patient  . E-Mycin [Erythromycin Base] Swelling  . Penicillins Swelling and Other (See Comments)    Childhood rxn--MD stated he "almost died" Has patient had a PCN reaction causing immediate rash, facial/tongue/throat swelling, SOB or lightheadedness with hypotension:Yes Has patient had a PCN reaction causing severe rash involving mucus membranes or skin necrosis:unsure Has patient had a PCN reaction that required hospitalization:unsure Has patient had a PCN reaction occurring within the last 10 years:No If all of the above answers are "NO", then may proceed with Cephalosporin use.    . Rosuvastatin     Other reaction(s): Myalgias (intolerance)    Social History   Socioeconomic History  . Marital status: Single    Spouse name: Not on file  . Number of children: Not on file  . Years of education: Not on file  . Highest education level: Not on file   Occupational History  . Occupation: Dealer  Tobacco Use  . Smoking status: Former Smoker    Packs/day: 0.25    Years: 46.00    Pack years: 11.50    Types: Cigarettes  . Smokeless tobacco: Never Used  Vaping Use  . Vaping Use: Never used  Substance and Sexual Activity  . Alcohol use: Yes    Alcohol/week: 3.0 standard drinks    Types: 3 Cans of beer per week    Comment: 3 beers per day  . Drug use: No  . Sexual activity: Yes    Birth control/protection: None  Other Topics Concern  . Not on file  Social History Narrative  . Not on file   Social Determinants of Health   Financial Resource Strain: Not on file  Food Insecurity: Not on file  Transportation Needs: Not on file  Physical Activity: Not on file  Stress: Not on file  Social Connections: Not on file  Intimate Partner Violence: Not on file    Family History  Problem Relation Age of Onset  . Lung cancer Father   . Brain cancer Father   . Diabetes Maternal Grandmother   . Colon polyps Neg Hx   . Esophageal cancer Neg Hx   . Pancreatic cancer Neg Hx   . Stomach cancer Neg Hx     ROS- All systems are reviewed and negative except as per the HPI above  Physical Exam: Vitals:   04/14/21 1018  BP: 122/80  Pulse: 78  Weight: 103.7 kg  Height: 5\' 10"  (1.778 m)   Wt Readings from Last 3 Encounters:  04/14/21 103.7 kg  04/09/21 103 kg  04/03/21 102 kg    Labs: Lab Results  Component Value Date   NA 135 04/09/2021   K 4.2 04/09/2021   CL 98 04/09/2021   CO2 30 04/09/2021   GLUCOSE 131 (H) 04/09/2021   BUN 20 04/09/2021   CREATININE 0.98 04/09/2021   CALCIUM 9.3 04/09/2021   PHOS 3.3 12/25/2020   MG 2.3 12/25/2020   Lab Results  Component Value Date   INR 0.9 03/25/2021   Lab Results  Component Value Date   CHOL 209 (H) 03/25/2021   HDL 45.20 03/25/2021   LDLCALC 108 (H) 07/10/2020  TRIG 216.0 (H) 03/25/2021     GEN- The patient is well appearing, alert and oriented x 3 today.   Head-  normocephalic, atraumatic Eyes-  Sclera clear, conjunctiva pink Ears- hearing intact Oropharynx- clear Neck- supple, no JVP Lymph- no cervical lymphadenopathy Lungs- Clear to ausculation bilaterally, normal work of breathing Heart- Regular rate and rhythm, no murmurs, rubs or gallops, PMI not laterally displaced GI- soft, NT, ND, + BS Extremities- no clubbing, cyanosis, or edema MS- no significant deformity or atrophy Skin- no rash or lesion Psych- euthymic mood, full affect Neuro- strength and sensation are intact  EKG-SR with first degree AV blcok RBBB v rate 78 bpm Monitor  strips reviewed - Afib at 120 bpm, 4/19 at 9:24 am, non sustained    Assessment and Plan: 1. New onset afib/presyncope  Pt did not  appear to be symptomatic with this event I will continue Preventice Zio patch  monitor for a full month with his episode as well as reported frequent  pre syncope spells He was informed not to drive  He has not tolerated BB in the past and do not want to start new meds right now until he can get his HF meds straightened out.  2. CHA2DS2VASc score of at least 2 Continue  eliquis 5 mg bid  Bleeding precautions discussed   3. Chronic combined systolic/diastolic HF   He feels he is not tolerating his HF meds, especially torsemide, which he states is causing presyncope  He is afraid thought  that he will end back in hospital with fluid overload I will get him an appointment with HF tomorrow to discuss   I will see back in 2 weeks   Butch Penny C. Gitty Osterlund, [facility] [address] [place], [place] [address] [phone]

## 2021-04-15 ENCOUNTER — Inpatient Hospital Stay (HOSPITAL_COMMUNITY)
Admission: EM | Admit: 2021-04-15 | Discharge: 2021-04-17 | DRG: 243 | Disposition: A | Discharge status: Home / Self Care | Payer: 59 | Attending: Cardiology | Admitting: Cardiology

## 2021-04-15 ENCOUNTER — Encounter (HOSPITAL_COMMUNITY): Payer: Self-pay

## 2021-04-15 ENCOUNTER — Telehealth: Payer: Self-pay

## 2021-04-15 ENCOUNTER — Emergency Department (HOSPITAL_COMMUNITY): Payer: 59

## 2021-04-15 ENCOUNTER — Ambulatory Visit (HOSPITAL_BASED_OUTPATIENT_CLINIC_OR_DEPARTMENT_OTHER)
Admission: RE | Admit: 2021-04-15 | Discharge: 2021-04-15 | Disposition: A | Discharge status: Home / Self Care | Payer: 59 | Source: Ambulatory Visit | Attending: Adult Health | Admitting: Adult Health

## 2021-04-15 ENCOUNTER — Other Ambulatory Visit: Payer: Self-pay

## 2021-04-15 VITALS — BP 142/98 | HR 73 | Wt 229.0 lb

## 2021-04-15 DIAGNOSIS — I442 Atrioventricular block, complete: Secondary | ICD-10-CM

## 2021-04-15 DIAGNOSIS — I480 Paroxysmal atrial fibrillation: Secondary | ICD-10-CM

## 2021-04-15 DIAGNOSIS — I110 Hypertensive heart disease with heart failure: Secondary | ICD-10-CM | POA: Insufficient documentation

## 2021-04-15 DIAGNOSIS — J439 Emphysema, unspecified: Secondary | ICD-10-CM | POA: Insufficient documentation

## 2021-04-15 DIAGNOSIS — Z87891 Personal history of nicotine dependence: Secondary | ICD-10-CM | POA: Insufficient documentation

## 2021-04-15 DIAGNOSIS — Z6832 Body mass index (BMI) 32.0-32.9, adult: Secondary | ICD-10-CM | POA: Insufficient documentation

## 2021-04-15 DIAGNOSIS — Z881 Allergy status to other antibiotic agents status: Secondary | ICD-10-CM

## 2021-04-15 DIAGNOSIS — I5042 Chronic combined systolic (congestive) and diastolic (congestive) heart failure: Secondary | ICD-10-CM | POA: Diagnosis not present

## 2021-04-15 DIAGNOSIS — R42 Dizziness and giddiness: Secondary | ICD-10-CM

## 2021-04-15 DIAGNOSIS — Z20822 Contact with and (suspected) exposure to covid-19: Secondary | ICD-10-CM | POA: Diagnosis not present

## 2021-04-15 DIAGNOSIS — I428 Other cardiomyopathies: Secondary | ICD-10-CM | POA: Diagnosis not present

## 2021-04-15 DIAGNOSIS — Z7901 Long term (current) use of anticoagulants: Secondary | ICD-10-CM | POA: Insufficient documentation

## 2021-04-15 DIAGNOSIS — G4733 Obstructive sleep apnea (adult) (pediatric): Secondary | ICD-10-CM | POA: Diagnosis present

## 2021-04-15 DIAGNOSIS — Z7984 Long term (current) use of oral hypoglycemic drugs: Secondary | ICD-10-CM

## 2021-04-15 DIAGNOSIS — I5022 Chronic systolic (congestive) heart failure: Secondary | ICD-10-CM | POA: Insufficient documentation

## 2021-04-15 DIAGNOSIS — Z808 Family history of malignant neoplasm of other organs or systems: Secondary | ICD-10-CM

## 2021-04-15 DIAGNOSIS — Z86010 Personal history of colonic polyps: Secondary | ICD-10-CM

## 2021-04-15 DIAGNOSIS — Z7951 Long term (current) use of inhaled steroids: Secondary | ICD-10-CM | POA: Insufficient documentation

## 2021-04-15 DIAGNOSIS — J449 Chronic obstructive pulmonary disease, unspecified: Secondary | ICD-10-CM

## 2021-04-15 DIAGNOSIS — Z7952 Long term (current) use of systemic steroids: Secondary | ICD-10-CM | POA: Insufficient documentation

## 2021-04-15 DIAGNOSIS — R918 Other nonspecific abnormal finding of lung field: Secondary | ICD-10-CM | POA: Insufficient documentation

## 2021-04-15 DIAGNOSIS — E669 Obesity, unspecified: Secondary | ICD-10-CM | POA: Diagnosis present

## 2021-04-15 DIAGNOSIS — Z9981 Dependence on supplemental oxygen: Secondary | ICD-10-CM

## 2021-04-15 DIAGNOSIS — Z880 Allergy status to penicillin: Secondary | ICD-10-CM

## 2021-04-15 DIAGNOSIS — R55 Syncope and collapse: Secondary | ICD-10-CM | POA: Diagnosis not present

## 2021-04-15 DIAGNOSIS — Z888 Allergy status to other drugs, medicaments and biological substances status: Secondary | ICD-10-CM

## 2021-04-15 DIAGNOSIS — Z6833 Body mass index (BMI) 33.0-33.9, adult: Secondary | ICD-10-CM

## 2021-04-15 DIAGNOSIS — Z79899 Other long term (current) drug therapy: Secondary | ICD-10-CM | POA: Insufficient documentation

## 2021-04-15 DIAGNOSIS — E785 Hyperlipidemia, unspecified: Secondary | ICD-10-CM | POA: Diagnosis present

## 2021-04-15 DIAGNOSIS — Z833 Family history of diabetes mellitus: Secondary | ICD-10-CM

## 2021-04-15 DIAGNOSIS — I10 Essential (primary) hypertension: Secondary | ICD-10-CM

## 2021-04-15 DIAGNOSIS — I4892 Unspecified atrial flutter: Secondary | ICD-10-CM | POA: Diagnosis present

## 2021-04-15 DIAGNOSIS — M542 Cervicalgia: Secondary | ICD-10-CM | POA: Diagnosis present

## 2021-04-15 DIAGNOSIS — I453 Trifascicular block: Secondary | ICD-10-CM | POA: Diagnosis present

## 2021-04-15 DIAGNOSIS — E119 Type 2 diabetes mellitus without complications: Secondary | ICD-10-CM | POA: Diagnosis present

## 2021-04-15 DIAGNOSIS — R519 Headache, unspecified: Secondary | ICD-10-CM | POA: Diagnosis present

## 2021-04-15 DIAGNOSIS — Z801 Family history of malignant neoplasm of trachea, bronchus and lung: Secondary | ICD-10-CM

## 2021-04-15 DIAGNOSIS — Z950 Presence of cardiac pacemaker: Secondary | ICD-10-CM

## 2021-04-15 DIAGNOSIS — R5383 Other fatigue: Secondary | ICD-10-CM | POA: Diagnosis present

## 2021-04-15 DIAGNOSIS — Z9181 History of falling: Secondary | ICD-10-CM

## 2021-04-15 DIAGNOSIS — I2510 Atherosclerotic heart disease of native coronary artery without angina pectoris: Secondary | ICD-10-CM | POA: Diagnosis present

## 2021-04-15 LAB — PROTIME-INR
INR: 0.9 (ref 0.8–1.2)
Prothrombin Time: 12.2 seconds (ref 11.4–15.2)

## 2021-04-15 LAB — HEPATIC FUNCTION PANEL
ALT: 18 U/L (ref 0–44)
AST: 25 U/L (ref 15–41)
Albumin: 3.8 g/dL (ref 3.5–5.0)
Alkaline Phosphatase: 48 U/L (ref 38–126)
Bilirubin, Direct: 0.1 mg/dL (ref 0.0–0.2)
Indirect Bilirubin: 0.8 mg/dL (ref 0.3–0.9)
Total Bilirubin: 0.9 mg/dL (ref 0.3–1.2)
Total Protein: 6.3 g/dL — ABNORMAL LOW (ref 6.5–8.1)

## 2021-04-15 LAB — RESP PANEL BY RT-PCR (FLU A&B, COVID) ARPGX2
Influenza A by PCR: NEGATIVE
Influenza B by PCR: NEGATIVE
SARS Coronavirus 2 by RT PCR: NEGATIVE

## 2021-04-15 LAB — CBC
HCT: 45.6 % (ref 39.0–52.0)
Hemoglobin: 15.5 g/dL (ref 13.0–17.0)
MCH: 33.8 pg (ref 26.0–34.0)
MCHC: 34 g/dL (ref 30.0–36.0)
MCV: 99.6 fL (ref 80.0–100.0)
Platelets: 203 10*3/uL (ref 150–400)
RBC: 4.58 MIL/uL (ref 4.22–5.81)
RDW: 13.3 % (ref 11.5–15.5)
WBC: 7.6 10*3/uL (ref 4.0–10.5)
nRBC: 0 % (ref 0.0–0.2)

## 2021-04-15 LAB — BASIC METABOLIC PANEL
Anion gap: 7 (ref 5–15)
BUN: 16 mg/dL (ref 8–23)
CO2: 27 mmol/L (ref 22–32)
Calcium: 9 mg/dL (ref 8.9–10.3)
Chloride: 106 mmol/L (ref 98–111)
Creatinine, Ser: 0.81 mg/dL (ref 0.61–1.24)
GFR, Estimated: >60 mL/min (ref >60)
Glucose, Bld: 113 mg/dL — ABNORMAL HIGH (ref 70–99)
Potassium: 3.6 mmol/L (ref 3.5–5.1)
Sodium: 140 mmol/L (ref 135–145)

## 2021-04-15 LAB — TROPONIN I (HIGH SENSITIVITY): Troponin I (High Sensitivity): 18 ng/L — ABNORMAL HIGH (ref <18)

## 2021-04-15 LAB — BRAIN NATRIURETIC PEPTIDE: B Natriuretic Peptide: 345.2 pg/mL — ABNORMAL HIGH (ref 0.0–100.0)

## 2021-04-15 MED ORDER — FUROSEMIDE 40 MG PO TABS: 40.0000 mg | ORAL_TABLET | Freq: Every day | ORAL | 3 refills | Status: DC

## 2021-04-15 MED ORDER — NICOTINE 21 MG/24HR TD PT24
21.0000 mg | MEDICATED_PATCH | Freq: Once | TRANSDERMAL | Status: DC
  Administered 2021-04-15: 21 mg via TRANSDERMAL
  Filled 2021-04-15: qty 1

## 2021-04-15 MED ORDER — LOSARTAN POTASSIUM 25 MG PO TABS: 12.5000 mg | ORAL_TABLET | Freq: Every day | ORAL | 3 refills | Status: DC

## 2021-04-15 NOTE — Telephone Encounter (Signed)
PT is calling in to request a callback he has some questions before he goes to the ER.Please advise

## 2021-04-15 NOTE — Patient Instructions (Signed)
Start Furosemide 40 mg Daily  Restart Losartan at 12.5 mg (1/2 tab) Daily at night  Your physician recommends that you return for lab work in: 1 week  Your physician recommends that you schedule a follow-up appointment in: 1 month  If you have any questions or concerns before your next appointment please send [place] a message through [facility] or call our office at [phone].    TO LEAVE A MESSAGE FOR THE NURSE SELECT OPTION 2, PLEASE LEAVE A MESSAGE INCLUDING: . YOUR NAME . DATE OF BIRTH . CALL BACK NUMBER . REASON FOR CALL**this is important as we prioritize the call backs  [place] AS LONG AS YOU CALL BEFORE 4:00 PM  At the [facility], you and your health needs are our priority. As part of our continuing mission to provide you with exceptional heart care, we have created designated Provider Care Teams. These Care Teams include your primary Cardiologist (physician) and Advanced Practice Providers (APPs- Physician Assistants and Nurse Practitioners) who all work together to provide you with the care you need, when you need it.   You may see any of the following providers on your designated Care Team at your next follow up: Marland Kitchen Dr Glori Bickers . Dr Loralie Champagne . Dr Vickki Muff . Darrick Grinder, NP . Lyda Jester, [place] . Audry Riles, PharmD   Please be sure to bring in all your medications bottles to every appointment.

## 2021-04-15 NOTE — Progress Notes (Signed)
ReDS Vest / Clip - 04/15/21 1400      ReDS Vest / Clip   Station Marker C    Ruler Value 31    ReDS Value Range Moderate volume overload    ReDS Actual Value 38

## 2021-04-15 NOTE — H&P (Incomplete)
Cardiology Admission History and Physical:   Patient ID: Robert Church MRN: [id number]; DOB: 16-Jan-1956   Admission date: 04/15/2021  Primary Care Provider: Janith Lima, MD [facility] Cardiologist: Quay Burow, MD / Glori Bickers, MD [facility] Electrophysiologist:  Afib clinic  Chief Complaint:  Presyncope/3rd degree AV block  Patient Profile:   Robert Church is a 65 y.o. 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    History of Present Illness:   Robert Church  is a 65 y.o. 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   He is followed by Dr. Gwenlyn Found and had a Myoview stress test performed 07/21/2018 showed EF 38% inferior scar with septal ischemia. A 2D echo performed 07/07/2018 revealed an EF of 30 to 35%. He was referred for cardiac CT 08/2018 showing a calcium score 148 (74th percentile)  LAD 25% otherwise normal cors. Dilated pulmonary arteries suggestive of PH.   Recently admitted 1/22 for acute on chronic hypoxic respiratory failure secondary to acute COPDE and a/c CHF w/ volume overload. He had massive diuresis w/ IV Lasix, diuresed down from admit wt of 265 lb to 210 lb. Echo was repeated 12/21, EF 40-45% (unchanged). RV mildly reduced, right ventricular systolic pressure was 40.9 mmHg. ReDs clip was 33%. BP was soft but stable. No med changes were made.   Saw Dr Haroldine Laws 03/04/21 and Reported  7 lb wt gain and increased DOE. Also bendopnea.  ReDs Clip elevated at 46%. BP 130/70. Arlyce Harman was increased 25 and added Jardiance 10 mg daily (Hgb A1c was 6.5). Of note spiro 25 mg was not tolerated due to dizziness so he cut back to 12.5 mg daily. He returned to the HF clinic 03/31/21 . At that time he was stable.  He had follow up 04/03/21 with general cardiology and reported dizziness associated with syncope x2. Cardiac Monitor placed.  04/11/21 had Carotids checked and this showed near-normal plaque.   He was noted to have episode of A fib noted monitor 4/21 and was started on eliquis.  Yesterday he was seen in the A fib clinic for new onset of A fib. He is not on BB as he did not tolerate bisoprol which wastried inthe past. He stated that he had had 3 episodes of near syncope recently.  At that OV there was concern over possible volume overload after his Arlyce Harman was decreased and was referred to AHF today as a workin.   Today he returns for an acute HF work in for possible volume overload. Overall he was feeling ok but worried because he had am episode earlier in the day when he bent over then had episode of neck pain going to the back of his head which was followed by being lightheaded. It lasted about 5 seconds. He checked his 02 sats/pulse -->93% and pulse was 39.   He tells me he has had 3 episodes of syncope.  He says that he will get a pain in the back  of his neck and head and then he could not see but could hear what was going on.  He never completely lost consciousness.  These episodes started a few weeks ago and the last episode was last Tuesday.  He has been wearing a heart monitor.  Today he got up this am and felt the best he has felt in a while and decided to do some yard work.  In went in to get something to drink and suddenly started sweating.  He went outside and felt like he could not breathe.  He then got lightheaded and felt like he was going to black out but did not lose consciousness.  He pushed the monitor.  The preventice rep  then called the office stating that the patient had had 3rd degree AVB at the time that correlated with patient's sx.  Strips were reviewed with DOD, Dr. Quentin Ore, and patient was instructed to go to the ER for further evaluation and admission for PPM in the am.  He was instructed not to take his evening dose of Eliquis.   He currently feels fine.  Denies any chest pain, PND, orthopnea, LE edema or palpitations.  He has chronic DOE that is stable for him. Tele thus far shows NSR with PACs.  Past Medical History:  Diagnosis Date  . Adenomatous colon polyp   . Allergy   . Anal fissure   . Asthma   . Bronchitis   . CHF (congestive heart failure) ([place])   . COPD (chronic obstructive pulmonary disease) ([place])   . Emphysema lung ([place])   . GERD (gastroesophageal reflux disease)   . Hyperlipidemia   . Hypertension   . OSA (obstructive sleep apnea)   . Pneumonia   . Sinusitis   . SOB (shortness of breath)     Past Surgical History:  Procedure Laterality Date  . LEFT HEART CATH AND CORONARY ANGIOGRAPHY N/A 04/19/2017   Procedure: Left Heart Cath and Coronary Angiography;  Surgeon: Belva Crome, MD;  Location: [facility];  Service: Cardiovascular;  Laterality: N/A;  . TONSILLECTOMY       Medications Prior to Admission: Prior to Admission medications   Medication Sig Start Date End Date Taking? Authorizing Provider  albuterol (PROVENTIL) (2.5 MG/3ML) 0.083% nebulizer solution Take 3 mLs (2.5 mg total) by nebulization every 6 (six) hours as needed for wheezing or shortness of breath. 10/01/20   Collene Gobble, MD  albuterol (VENTOLIN HFA) 108 (90 Base) MCG/ACT inhaler INHALE 2 PUFFS INTO THE LUNGS EVERY 6 HOURS AS NEEDED FOR WHEEZING 02/27/21   Byrum, Rose Fillers, MD  allopurinol (ZYLOPRIM) 300 MG tablet Take 1 tablet (300 mg total) by mouth daily. 11/29/20   Janith Lima, MD  apixaban (ELIQUIS) 5 MG TABS tablet Take 1 tablet (5 mg total) by mouth 2 (two) times daily. 04/10/21   Lelon Perla, MD  furosemide (LASIX) 40 MG tablet Take 1 tablet (40 mg total) by mouth daily. 04/15/21   Clegg, Amy D, NP  levofloxacin (LEVAQUIN) 500 MG tablet Take 1 tablet (500 mg total) by mouth daily for 10 days. 04/09/21 04/19/21  Biagio Borg, MD  Lidocaine, Anorectal, 5 % CREA Apply topically as needed.    [provider]  losartan (COZAAR) 25 MG tablet Take 0.5 tablets (12.5 mg total) by mouth at bedtime. 04/15/21   Clegg, Amy D, NP  metFORMIN (GLUCOPHAGE XR) 750 MG 24 hr tablet Take 1 tablet (750 mg total) by mouth daily  with breakfast. 12/09/20   Janith Lima, MD  mometasone-formoterol (DULERA) 200-5 MCG/ACT AERO Dulera 200 mcg-5 mcg/actuation HFA aerosol inhaler  INHALE 2 PUFFS BY MOUTH FIRST THING IN THE MORNING AND THEN ANOTHER 2 PUFFS ABOUT 12 HOURS LATER 02/11/21   Collene Gobble, MD  montelukast (SINGULAIR) 10 MG tablet Take 1 tablet (10 mg total) by mouth at bedtime. 02/06/21   Biagio Borg, MD  omeprazole (PRILOSEC) 20 MG capsule Take 1 capsule (20 mg total) by mouth 2 (two) times daily before a meal. 07/28/20   Janith Lima, MD  polyethylene glycol (MIRALAX / GLYCOLAX) 17 g packet Take 17 g by mouth daily as needed. 12/30/20   Desiree Hane, MD  predniSONE (DELTASONE) 10 MG tablet Take 1 tablet (10 mg total) by mouth daily with breakfast. 12/11/20   Collene Gobble, MD  Tiotropium Bromide Monohydrate (SPIRIVA RESPIMAT) 2.5 MCG/ACT AERS Inhale 2.5 mcg into the lungs 2 (two) times daily. 12/11/20   Collene Gobble, MD  Zinc 100 MG TABS Take 1 tablet by mouth daily.    [provider]     Allergies:    Allergies  Allergen Reactions  . Clindamycin Other (See Comments)    Tongue swelling  . Doxycycline     Severe stomach upset per patient  . E-Mycin [Erythromycin Base] Swelling  . Penicillins Swelling and Other (See Comments)    Childhood rxn--MD stated he "almost died" Has patient had a PCN reaction causing immediate rash, facial/tongue/throat swelling, SOB or  lightheadedness with hypotension:Yes Has patient had a PCN reaction causing severe rash involving mucus membranes or skin necrosis:unsure Has patient had a PCN reaction that required hospitalization:unsure Has patient had a PCN reaction occurring within the last 10 years:No If all of the above answers are "NO", then may proceed with Cephalosporin use.    . Rosuvastatin     Other reaction(s): Myalgias (intolerance)    Social History:   Social History   Socioeconomic History  . Marital status: Single    Spouse name: Not on file  . Number of children: Not on file  . Years of education: Not on file  . Highest education level: Not on file  Occupational History  . Occupation: Dealer  Tobacco Use  . Smoking status: Former Smoker    Packs/day: 0.25    Years: 46.00    Pack years: 11.50    Types: Cigarettes  . Smokeless tobacco: Never Used  Vaping Use  . Vaping Use: Never used  Substance and Sexual Activity  . Alcohol use: Yes    Alcohol/week: 3.0 standard drinks    Types: 3 Cans of beer per week    Comment: 3 beers per day  . Drug use: No  . Sexual activity: Yes    Birth control/protection: None  Other Topics Concern  . Not on file  Social History Narrative  . Not on file   Social Determinants of Health   Financial Resource Strain: Not on file  Food Insecurity: Not on file  Transportation Needs: Not on file  Physical Activity: Not on file  Stress: Not on file  Social Connections: Not on file  Intimate Partner Violence: Not on file    Family History:   The patient's family history includes Brain cancer in his father; Diabetes in his maternal grandmother; Lung cancer in his father. There is no history of Colon polyps, Esophageal cancer, Pancreatic cancer, or Stomach cancer.    ROS:  Please see the history  of present illness.  All other ROS reviewed and negative.     Physical Exam/Data:   Vitals:   04/15/21 2200 04/15/21 2215 04/15/21 2230 04/15/21 2245  BP: (!)  149/99 (!) 143/124 131/84 (!) 125/97  Pulse: 62 83 87 80  Resp: (!) 21 19 19  (!) 21  Temp:      TempSrc:      SpO2: 97% 93% 95% 95%   No intake or output data in the 24 hours ending 04/15/21 2316 Last 3 Weights 04/15/2021 04/14/2021 04/09/2021  Weight (lbs) 229 lb 228 lb 9.6 oz 227 lb  Weight (kg) 103.874 kg 103.692 kg 102.967 kg     There is no height or weight on file to calculate BMI.  General:  Well nourished, well developed, in no acute distress HEENT: normal Lymph: no adenopathy Neck: no JVD Endocrine:  No thryomegaly Vascular: No carotid bruits; FA pulses 2+ bilaterally without bruits  Cardiac:  normal S1, S2; RRR; no murmur  Lungs:  clear to auscultation bilaterally, no wheezing, rhonchi or rales  Abd: soft, nontender, no hepatomegaly  Ext: no edema Musculoskeletal:  No deformities, BUE and BLE strength normal and equal Skin: warm and dry  Neuro:  CNs 2-12 intact, no focal abnormalities noted Psych:  Normal affect    EKG:  The ECG that was done NSR was personally reviewed and demonstrates NSR with PACs, RBBB and LAFB  Relevant CV Studies: 2D echo 11/2020 IMPRESSIONS   1. Left ventricular ejection fraction, by estimation, is 40 to 45%. The  left ventricle has mildly decreased function. The left ventricle  demonstrates global hypokinesis. The left ventricular internal cavity size  was moderately dilated. There is mild  left ventricular hypertrophy. Left ventricular diastolic parameters are  consistent with Grade II diastolic dysfunction (pseudonormalization).  Elevated left atrial pressure.  2. Right ventricular systolic function is mildly reduced. The right  ventricular size is severely enlarged. There is moderately elevated  pulmonary artery systolic pressure. The estimated right ventricular  systolic pressure is 43.1 mmHg.  3. Left atrial size was mildly dilated.  4. Right atrial size was mildly dilated.  5. The mitral valve is normal in structure. No  evidence of mitral valve  regurgitation.  6. The aortic valve was not well visualized. Aortic valve regurgitation  is not visualized. No aortic stenosis is present.  7. There is mild dilatation of the ascending aorta, measuring 37 mm.  8. The inferior vena cava is dilated in size with <50% respiratory  variability, suggesting right atrial pressure of 15 mmHg.   Laboratory Data:  High Sensitivity Troponin:   Recent Labs  Lab 04/15/21 2053  TROPONINIHS 18*      Chemistry Recent Labs  Lab 04/09/21 1452 04/15/21 2053  NA 135 140  K 4.2 3.6  CL 98 106  CO2 30 27  GLUCOSE 131* 113*  BUN 20 16  CREATININE 0.98 0.81  CALCIUM 9.3 9.0  GFRNONAA  --  >60  ANIONGAP  --  7    Recent Labs  Lab 04/09/21 1452 04/15/21 2200  PROT 6.5 6.3*  ALBUMIN 4.0 3.8  AST 16 25  ALT 17 18  ALKPHOS 43 48  BILITOT 0.6 0.9   Hematology Recent Labs  Lab 04/09/21 1452 04/15/21 2053  WBC 9.2 7.6  RBC 4.63 4.58  HGB 15.6 15.5  HCT 45.3 45.6  MCV 97.9 99.6  MCH  --  33.8  MCHC 34.4 34.0  RDW 14.7 13.3  PLT 188.0 203  BNP Recent Labs  Lab 04/09/21 1452 04/15/21 2159  BNP  --  345.2*  PROBNP 156.0*  --     DDimer No results for input(s): DDIMER in the last 168 hours.   Radiology/Studies:  DG Chest Port 1 View  Result Date: 04/15/2021 CLINICAL DATA:  64 year old male with intermittent loss of consciousness. EXAM: PORTABLE CHEST 1 VIEW COMPARISON:  Chest radiograph dated 04/09/2021. FINDINGS: Mild cardiomegaly. No focal consolidation, pleural effusion, or pneumothorax. Atherosclerotic calcification of the aorta. No acute osseous pathology. IMPRESSION: No acute cardiopulmonary process. Electronically Signed   By: Anner Crete M.D.   On: 04/15/2021 21:51     Assessment and Plan:   1. Pre syncope -he has had 3 of these episodes since Easter with the last episode being last Tuesday until his episode today -all the episodes start with pain in the back of his head and then he  gets tunnel vision, loses his ability to see but can hear what is going on around him -Event monitor today during the event showed 3rd degree AV block corresponding to his sx -now presenting to ER per Dr. Quentin Ore for admission for [facility] tomorrow -tele reviewed in the ER shows NSR with PAC with no further heart block -he is not on any AV nodal blocking agents -BMET normal with K+ 3.6 and normal renal function -he was told not to take his pm Eliquis -will admit to stepdown tele bed -NPO after MN -EP to see in am for PPM  2.  3rd degree AV block -resulting in pre syncope -see #1>>plan for PPM tomorrow  3.  Chronic systolic CHF due to NICM - cath 4/18 normal cors. EF 48%  - Myoview 07/21/2018 showed EF 38% inferior scar with septal ischemia.  - Echo 07/07/2018 revealed EF 30-35%. RV normal.  - Cardiac CT 08/26/18.  Calcium score 148 (74th percentile)  LAD 25% otherwise normal cors. Dilated pulmonary arteries suggestive of PH.  - Suspect NICM due to HTN and inadequately treated OSA (He now reports improved nightly compliance w/ CPAP). - Echo 12/20 EF 45% mild RV dysfunction - Recent admit for a/c CHF 1/21, EF unchanged 40-45%. RV mildly reduced. RVSP 49 - Chronically NYHA confounded by COPD and obesity/ deconditioning - Reds Clip 38%. NYHA III.  - Started on lasix 40 mg daily and Losartan 12.5mg  daily today. Keep off torsemide.  - Cardiomems was discussed in AHF clinic today but need to wait until arrhythmias addressed - NO BB with new 3rd degree heart block until PPM then can start Toprol (prefer B1 selective agent given his COPD).    4.  HTN -BP controlled on exam -started Losartan 12.5mg  qhs today by AHF  5.  COPD -he has quit smoking -he takes Prednisone 10mg  daily chronically -On home O2 PRN and with BiPAP nightly -followed by Pulmonary  6.  OSA -On BIPAP nightly  7.  PAF -followed in afib clinic -on Eliquis 5mg  BID>>held evening dose for PPM tomorrow  8.  DVT prophylaxis -SQ  Heparin gtt per pharmacy until after PPM   {Complete the following score calculators/questions which help meet required metrics.  Press F2         :[id number]       New York Heart Association (NYHA) Functional Class NYHA Class II  [id number] Score =    {Click here to calculate score.  REFRESH note before signing.       [id number] indicates a  % annual risk of stroke. The patient's score is based  upon:      Severity of Illness: {Observation/Inpatient:21159}   For questions or updates, please contact [place] Please consult [email or web address] for contact info under     Signed, Fransico Him, MD  04/15/2021 11:16 PM

## 2021-04-15 NOTE — ED Triage Notes (Signed)
Pt states he has had intermittent LOC for past few weeks. Everything goes Robert Church and he cannot remember what happened. Pt c/o pain in head intermitently, denies at this time.

## 2021-04-15 NOTE — Telephone Encounter (Signed)
Pt in clinic for HF - noted telephone encounter regarding monitor strip. Pt was symptomatic at time of event. Per Roderic Palau NP will have pt urgently see EP for evaluation of PPM. Pt in agreement.

## 2021-04-15 NOTE — ED Triage Notes (Signed)
Emergency Medicine Provider Triage Evaluation Note  Jamall Strohmeier , a 65 y.o. male  was evaluated in triage.  Pt complains of near syncope.  Review of Systems  Positive: Near syncope, bradycardic Negative: Fever, cp  Physical Exam  BP 117/65   Pulse 80   Temp 98.2 F (36.8 C) (Oral)   Resp 16   SpO2 97%  Gen:   Awake, no distress   HEENT:  Atraumatic  Resp:  Normal effort  Cardiac:  Normal rate  Abd:   Nondistended, nontender  MSK:   Moves extremities without difficulty  Neuro:  Speech clear   Medical Decision Making  Medically screening exam initiated at 8:35 PM.  Appropriate orders placed.  Tayari Yankee was informed that the remainder of the evaluation will be completed by another provider, this initial triage assessment does not replace that evaluation, and the importance of remaining in the ED until their evaluation is complete.  Clinical Impression  Pt with cardiac monitor x 2 weeks due to intermittent near syncopal episodes and bradycardia.  Likely need pace maker   Domenic Moras, Hershal Coria 04/15/21 2038

## 2021-04-15 NOTE — H&P (Signed)
Cardiology Admission History and Physical:   Patient ID: Robert Church MRN: [id number]; DOB: 16-Jan-1956   Admission date: 04/15/2021  Primary Care Provider: Janith Lima, MD [facility] Cardiologist: Robert Burow, MD / Robert Bickers, MD [facility] Electrophysiologist:  Afib clinic  Chief Complaint:  Presyncope/3rd degree AV block  Patient Profile:   Robert Church is a 65 y.o. 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    History of Present Illness:   [name]  is a 65 y.o. 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   He is followed by Dr. Gwenlyn Church and had a Myoview stress test performed 07/21/2018 showed EF 38% inferior scar with septal ischemia. A 2D echo performed 07/07/2018 revealed an EF of 30 to 35%. He was referred for cardiac CT 08/2018 showing a calcium score 148 (74th percentile)  LAD 25% otherwise normal cors. Dilated pulmonary arteries suggestive of PH.   Recently admitted 1/22 for acute on chronic hypoxic respiratory failure secondary to acute COPDE and a/c CHF w/ volume overload. He had massive diuresis w/ IV Lasix, diuresed down from admit wt of 265 lb to 210 lb. Echo was repeated 12/21, EF 40-45% (unchanged). RV mildly reduced, right ventricular systolic pressure was 40.9 mmHg. ReDs clip was 33%. BP was soft but stable. No med changes were made.   Saw Dr Robert Church 03/04/21 and Reported  7 lb wt gain and increased DOE. Also bendopnea.  ReDs Clip elevated at 46%. BP 130/70. Arlyce Harman was increased 25 and added Jardiance 10 mg daily (Hgb A1c was 6.5). Of note spiro 25 mg was not tolerated due to dizziness so he cut back to 12.5 mg daily. He returned to the HF clinic 03/31/21 . At that time he was stable.  He had follow up 04/03/21 with general cardiology and reported dizziness associated with syncope x2. Cardiac Monitor placed.  04/11/21 had Carotids checked and this showed near-normal plaque.   He was noted to have episode of A fib noted monitor 4/21 and was started on eliquis.  Yesterday he was seen in the A fib clinic for new onset of A fib. He is not on BB as he did not tolerate bisoprol which wastried inthe past. He stated that he had had 3 episodes of near syncope recently.  At that OV there was concern over possible volume overload after his Arlyce Harman was decreased and was referred to AHF today as a workin.   Today he returns for an acute HF work in for possible volume overload. Overall he was feeling ok but worried because he had am episode earlier in the day when he bent over then had episode of neck pain going to the back of his head which was followed by being lightheaded. It lasted about 5 seconds. He checked his 02 sats/pulse -->93% and pulse was 39.   He tells me he has had 3 episodes of syncope.  He says that he will get a pain in the back  of his neck and head and then he could not see but could hear what was going on.  He never completely lost consciousness.  These episodes started a few weeks ago and the last episode was last Tuesday.  He has been wearing a heart monitor.  Today he got up this am and felt the best he has felt in a while and decided to do some yard work.  In went in to get something to drink and suddenly started sweating.  He went outside and felt like he could not breathe.  He then got lightheaded and felt like he was going to black out but did not lose consciousness.  He pushed the monitor.  The preventice rep  then called the office stating that the patient had had 3rd degree AVB at the time that correlated with patient's sx.  Strips were reviewed with DOD, Dr. Quentin Ore, and patient was instructed to go to the ER for further evaluation and admission for PPM in the am.  He was instructed not to take his evening dose of Eliquis.   He currently feels fine.  Denies any chest pain, PND, orthopnea, LE edema or palpitations.  He has chronic DOE that is stable for him. Tele thus far shows NSR with PACs.  Past Medical History:  Diagnosis Date  . Adenomatous colon polyp   . Allergy   . Anal fissure   . Asthma   . Bronchitis   . CHF (congestive heart failure) ([place])   . COPD (chronic obstructive pulmonary disease) ([place])   . Emphysema lung ([place])   . GERD (gastroesophageal reflux disease)   . Hyperlipidemia   . Hypertension   . OSA (obstructive sleep apnea)   . Pneumonia   . Sinusitis   . SOB (shortness of breath)     Past Surgical History:  Procedure Laterality Date  . LEFT HEART CATH AND CORONARY ANGIOGRAPHY N/A 04/19/2017   Procedure: Left Heart Cath and Coronary Angiography;  Surgeon: Belva Crome, MD;  Location: [facility];  Service: Cardiovascular;  Laterality: N/A;  . TONSILLECTOMY       Medications Prior to Admission: Prior to Admission medications   Medication Sig Start Date End Date Taking? Authorizing Provider  albuterol (PROVENTIL) (2.5 MG/3ML) 0.083% nebulizer solution Take 3 mLs (2.5 mg total) by nebulization every 6 (six) hours as needed for wheezing or shortness of breath. 10/01/20   Collene Gobble, MD  albuterol (VENTOLIN HFA) 108 (90 Base) MCG/ACT inhaler INHALE 2 PUFFS INTO THE LUNGS EVERY 6 HOURS AS NEEDED FOR WHEEZING 02/27/21   Byrum, Rose Fillers, MD  allopurinol (ZYLOPRIM) 300 MG tablet Take 1 tablet (300 mg total) by mouth daily. 11/29/20   Janith Lima, MD  apixaban (ELIQUIS) 5 MG TABS tablet Take 1 tablet (5 mg total) by mouth 2 (two) times daily. 04/10/21   Lelon Perla, MD  furosemide (LASIX) 40 MG tablet Take 1 tablet (40 mg total) by mouth daily. 04/15/21   Clegg, Amy D, NP  levofloxacin (LEVAQUIN) 500 MG tablet Take 1 tablet (500 mg total) by mouth daily for 10 days. 04/09/21 04/19/21  Biagio Borg, MD  Lidocaine, Anorectal, 5 % CREA Apply topically as needed.    [provider]  losartan (COZAAR) 25 MG tablet Take 0.5 tablets (12.5 mg total) by mouth at bedtime. 04/15/21   Clegg, Amy D, NP  metFORMIN (GLUCOPHAGE XR) 750 MG 24 hr tablet Take 1 tablet (750 mg total) by mouth daily  with breakfast. 12/09/20   Janith Lima, MD  mometasone-formoterol (DULERA) 200-5 MCG/ACT AERO Dulera 200 mcg-5 mcg/actuation HFA aerosol inhaler  INHALE 2 PUFFS BY MOUTH FIRST THING IN THE MORNING AND THEN ANOTHER 2 PUFFS ABOUT 12 HOURS LATER 02/11/21   Collene Gobble, MD  montelukast (SINGULAIR) 10 MG tablet Take 1 tablet (10 mg total) by mouth at bedtime. 02/06/21   Biagio Borg, MD  omeprazole (PRILOSEC) 20 MG capsule Take 1 capsule (20 mg total) by mouth 2 (two) times daily before a meal. 07/28/20   Janith Lima, MD  polyethylene glycol (MIRALAX / GLYCOLAX) 17 g packet Take 17 g by mouth daily as needed. 12/30/20   Desiree Hane, MD  predniSONE (DELTASONE) 10 MG tablet Take 1 tablet (10 mg total) by mouth daily with breakfast. 12/11/20   Collene Gobble, MD  Tiotropium Bromide Monohydrate (SPIRIVA RESPIMAT) 2.5 MCG/ACT AERS Inhale 2.5 mcg into the lungs 2 (two) times daily. 12/11/20   Collene Gobble, MD  Zinc 100 MG TABS Take 1 tablet by mouth daily.    [provider]     Allergies:    Allergies  Allergen Reactions  . Clindamycin Other (See Comments)    Tongue swelling  . Doxycycline     Severe stomach upset per patient  . E-Mycin [Erythromycin Base] Swelling  . Penicillins Swelling and Other (See Comments)    Childhood rxn--MD stated he "almost died" Has patient had a PCN reaction causing immediate rash, facial/tongue/throat swelling, SOB or  lightheadedness with hypotension:Yes Has patient had a PCN reaction causing severe rash involving mucus membranes or skin necrosis:unsure Has patient had a PCN reaction that required hospitalization:unsure Has patient had a PCN reaction occurring within the last 10 years:No If all of the above answers are "NO", then may proceed with Cephalosporin use.    . Rosuvastatin     Other reaction(s): Myalgias (intolerance)    Social History:   Social History   Socioeconomic History  . Marital status: Single    Spouse name: Not on file  . Number of children: Not on file  . Years of education: Not on file  . Highest education level: Not on file  Occupational History  . Occupation: Dealer  Tobacco Use  . Smoking status: Former Smoker    Packs/day: 0.25    Years: 46.00    Pack years: 11.50    Types: Cigarettes  . Smokeless tobacco: Never Used  Vaping Use  . Vaping Use: Never used  Substance and Sexual Activity  . Alcohol use: Yes    Alcohol/week: 3.0 standard drinks    Types: 3 Cans of beer per week    Comment: 3 beers per day  . Drug use: No  . Sexual activity: Yes    Birth control/protection: None  Other Topics Concern  . Not on file  Social History Narrative  . Not on file   Social Determinants of Health   Financial Resource Strain: Not on file  Food Insecurity: Not on file  Transportation Needs: Not on file  Physical Activity: Not on file  Stress: Not on file  Social Connections: Not on file  Intimate Partner Violence: Not on file    Family History:   The patient's family history includes Brain cancer in his father; Diabetes in his maternal grandmother; Lung cancer in his father. There is no history of Colon polyps, Esophageal cancer, Pancreatic cancer, or Stomach cancer.    ROS:  Please see the history  of present illness.  All other ROS reviewed and negative.     Physical Exam/Data:   Vitals:   04/15/21 2215 04/15/21 2230 04/15/21 2245 04/15/21 2320  BP: (!)  143/124 131/84 (!) 125/97 (!) 142/92  Pulse: 83 87 80 83  Resp: 19 19 (!) 21 17  Temp:      TempSrc:      SpO2: 93% 95% 95% 96%   No intake or output data in the 24 hours ending 04/16/21 0005 Last 3 Weights 04/15/2021 04/14/2021 04/09/2021  Weight (lbs) 229 lb 228 lb 9.6 oz 227 lb  Weight (kg) 103.874 kg 103.692 kg 102.967 kg     There is no height or weight on file to calculate BMI.  General:  Well nourished, well developed, in no acute distress HEENT: normal Lymph: no adenopathy Neck: no JVD Endocrine:  No thryomegaly Vascular: No carotid bruits; FA pulses 2+ bilaterally without bruits  Cardiac:  normal S1, S2; RRR; no murmur  Lungs:  clear to auscultation bilaterally, no wheezing, rhonchi or rales  Abd: soft, nontender, no hepatomegaly  Ext: no edema Musculoskeletal:  No deformities, BUE and BLE strength normal and equal Skin: warm and dry  Neuro:  CNs 2-12 intact, no focal abnormalities noted Psych:  Normal affect    EKG:  The ECG that was done NSR was personally reviewed and demonstrates NSR with PACs, RBBB and LAFB  Relevant CV Studies: 2D echo 11/2020 IMPRESSIONS   1. Left ventricular ejection fraction, by estimation, is 40 to 45%. The  left ventricle has mildly decreased function. The left ventricle  demonstrates global hypokinesis. The left ventricular internal cavity size  was moderately dilated. There is mild  left ventricular hypertrophy. Left ventricular diastolic parameters are  consistent with Grade II diastolic dysfunction (pseudonormalization).  Elevated left atrial pressure.  2. Right ventricular systolic function is mildly reduced. The right  ventricular size is severely enlarged. There is moderately elevated  pulmonary artery systolic pressure. The estimated right ventricular  systolic pressure is 123456 mmHg.  3. Left atrial size was mildly dilated.  4. Right atrial size was mildly dilated.  5. The mitral valve is normal in structure. No evidence  of mitral valve  regurgitation.  6. The aortic valve was not well visualized. Aortic valve regurgitation  is not visualized. No aortic stenosis is present.  7. There is mild dilatation of the ascending aorta, measuring 37 mm.  8. The inferior vena cava is dilated in size with <50% respiratory  variability, suggesting right atrial pressure of 15 mmHg.   Laboratory Data:  High Sensitivity Troponin:   Recent Labs  Lab 04/15/21 2053  TROPONINIHS 18*      Chemistry Recent Labs  Lab 04/09/21 1452 04/15/21 2053  NA 135 140  K 4.2 3.6  CL 98 106  CO2 30 27  GLUCOSE 131* 113*  BUN 20 16  CREATININE 0.98 0.81  CALCIUM 9.3 9.0  GFRNONAA  --  >60  ANIONGAP  --  7    Recent Labs  Lab 04/09/21 1452 04/15/21 2200  PROT 6.5 6.3*  ALBUMIN 4.0 3.8  AST 16 25  ALT 17 18  ALKPHOS 43 48  BILITOT 0.6 0.9   Hematology Recent Labs  Lab 04/09/21 1452 04/15/21 2053  WBC 9.2 7.6  RBC 4.63 4.58  HGB 15.6 15.5  HCT 45.3 45.6  MCV 97.9 99.6  MCH  --  33.8  MCHC 34.4 34.0  RDW 14.7 13.3  PLT 188.0 203   BNP  Recent Labs  Lab 04/09/21 1452 04/15/21 2159  BNP  --  345.2*  PROBNP 156.0*  --     DDimer No results for input(s): DDIMER in the last 168 hours.   Radiology/Studies:  DG Chest Port 1 View  Result Date: 04/15/2021 CLINICAL DATA:  65 year old male with intermittent loss of consciousness. EXAM: PORTABLE CHEST 1 VIEW COMPARISON:  Chest radiograph dated 04/09/2021. FINDINGS: Mild cardiomegaly. No focal consolidation, pleural effusion, or pneumothorax. Atherosclerotic calcification of the aorta. No acute osseous pathology. IMPRESSION: No acute cardiopulmonary process. Electronically Signed   By: Anner Crete M.D.   On: 04/15/2021 21:51     Assessment and Plan:   1. Pre syncope -he has had 3 of these episodes since Easter with the last episode being last Tuesday until his episode today -all the episodes start with pain in the back of his head and then he gets  tunnel vision, loses his ability to see but can hear what is going on around him -Event monitor today during the event showed 3rd degree AV block corresponding to his sx -now presenting to ER per Dr. Quentin Ore for admission for [facility] tomorrow -tele reviewed in the ER shows NSR with PAC with no further heart block -he is not on any AV nodal blocking agents -BMET normal with K+ 3.6 and normal renal function -he was told not to take his pm Eliquis -will admit to stepdown tele bed -NPO after MN -EP to see in am for PPM  2.  3rd degree AV block -resulting in pre syncope -see #1>>plan for PPM tomorrow  3.  Chronic systolic CHF due to NICM - cath 4/18 normal cors. EF 48%  - Myoview 07/21/2018 showed EF 38% inferior scar with septal ischemia.  - Echo 07/07/2018 revealed EF 30-35%. RV normal.  - Cardiac CT 08/26/18.  Calcium score 148 (74th percentile)  LAD 25% otherwise normal cors. Dilated pulmonary arteries suggestive of PH.  - Suspect NICM due to HTN and inadequately treated OSA (He now reports improved nightly compliance w/ CPAP). - Echo 12/20 EF 45% mild RV dysfunction - Recent admit for a/c CHF 1/21, EF unchanged 40-45%. RV mildly reduced. RVSP 49 - Chronically NYHA confounded by COPD and obesity/ deconditioning - Reds Clip 38%. NYHA III.  - Started on lasix 40 mg daily and Losartan 12.5mg  daily today. Keep off torsemide.  - Cardiomems was discussed in AHF clinic today but need to wait until arrhythmias addressed - NO BB with new 3rd degree heart block until PPM then can start Toprol (prefer B1 selective agent given his COPD).    4.  HTN -BP controlled on exam -started Losartan 12.5mg  qhs today by AHF  5.  COPD -he has quit smoking -he takes Prednisone 10mg  daily chronically -On home O2 PRN and with BiPAP nightly -followed by Pulmonary  6.  OSA -On BIPAP nightly  7.  PAF -followed in afib clinic -on Eliquis 5mg  BID>>held evening dose for PPM tomorrow  8.  DVT prophylaxis -SQ  Heparin gtt per pharmacy until after PPM :[id number]}    New York Heart Association (NYHA) Functional Class NYHA Class II  CHA2DS2-VASc Score = 3  This indicates a 3.2% annual risk of stroke. The patient's score is based upon: CHF History: Yes HTN History: Yes Diabetes History: No Stroke History: No Vascular Disease History: Yes Age Score: 0 Gender Score: 0   Severity of Illness: The appropriate patient status for this patient is OBSERVATION. Observation status is judged to be reasonable and  necessary in order to provide the required intensity of service to ensure the patient's safety. The patient's presenting symptoms, physical exam findings, and initial radiographic and laboratory data in the context of their medical condition is felt to place them at decreased risk for further clinical deterioration. Furthermore, it is anticipated that the patient will be medically stable for discharge from the hospital within 2 midnights of admission. The following factors support the patient status of observation.   " The patient's presenting symptoms include Presyncope. " The physical exam findings include none. " The initial radiographic and laboratory data are event monitor with 3rd degree AVB corresponding to patient's symptoms of presyncope.     For questions or updates, please contact [facility] Please consult [email or web address] for contact info under     Signed, Fransico Him, MD  04/16/2021 12:05 AM

## 2021-04-15 NOTE — ED Provider Notes (Signed)
[facility] EMERGENCY DEPARTMENT Provider Note   CSN: [id number] Arrival date & time: 04/15/21  1904     History Chief Complaint  Patient presents with  . Loss of Consciousness    Robert Church is a 65 y.o. male.  HPI      65 year old male with a history of hypertension, COPD, chronic systolic congestive heart failure, ejection fraction of 40% with normal coronaries on catheterization 4/18, who has been followed by cardiology for his congestive heart failure, atrial fibrillation, and syncopal episodes and had a [employer] monitor in place which showed third-degree heart block and was advised to come to ED for PPM placement.   Reports in December he had some full syncopal epsidoes x 3, since then has had some episodes of lightheadedness. Today, he was out and feeling dizzy and pressed the button on his monitor. Reports the heart rate on his pulse ox was 39.  Feels better now, no dizziness. Reports they had held his diuretics due to his episodes of dizziness and has now been feeling some dyspnea and noted weight gain.    Past Medical History:  Diagnosis Date  . Adenomatous colon polyp   . Allergy   . Anal fissure   . Asthma   . Bronchitis   . CHF (congestive heart failure) ([place])   . COPD (chronic obstructive pulmonary disease) ([place])   . Emphysema lung ([place])   . GERD (gastroesophageal reflux disease)   . Hyperlipidemia   . Hypertension   . OSA (obstructive sleep apnea)   . Pneumonia   . Sinusitis   . SOB (shortness of breath)     Patient Active Problem List   Diagnosis Date Noted  . AV block, 3rd degree ([place]) 04/16/2021  . Postural dizziness with presyncope 04/09/2021  . Carotid artery calcification 04/09/2021  . Hypotension 04/09/2021  . 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 Atherosclerosis of aorta ([place]) 03/25/2021  . Body mass index (BMI) 32.0-32.9, adult 01/15/2021  . Lumbar spondylosis 01/10/2021  . Foraminal stenosis of cervical  region 01/10/2021  . Chronic bilateral low back pain without sciatica 01/09/2021  . Type II diabetes mellitus with manifestations ([place]) 11/29/2020  . Thrombocytopenia ([place]) 11/29/2020  . Edema of lower extremity due to peripheral venous insufficiency 10/29/2020  . Primary osteoarthritis of both knees 09/24/2020  . Gynecomastia, male 08/08/2020  . Idiopathic chronic gout of foot without tophus 08/08/2020  . Yellow jacket sting allergy 08/08/2020  . Class 2 severe obesity due to excess calories with serious comorbidity and body mass index (BMI) of 38.0 to 38.9 in adult ([place]) 08/05/2020  . Hyperlipidemia LDL goal <70 07/11/2020  . Hypogonadism male 07/08/2020  . Hypertriglyceridemia 07/08/2020  . Routine general medical examination at a health care facility 07/08/2020  . Umbilical hernia without obstruction and without gangrene 07/08/2020  . Chronic combined systolic and diastolic CHF (congestive heart failure) ([place]) 01/09/2020  . Solitary pulmonary nodule 08/15/2019  . 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 NICM (nonischemic cardiomyopathy) ([place]) 12/16/2018  . History of adenomatous polyp of colon 11/25/2018  . Morbid obesity ([place]) 11/24/2015  . Cigarette smoker 01/16/2015  . COPD exacerbation ([place]) 06/15/2014  . COPD, group D, by GOLD 2017 classification ([place]) 03/03/2013  . Obstructive sleep apnea syndrome 01/05/2013  . Asbestos exposure 01/05/2013  . Seasonal allergic rhinitis 10/06/2012  . GERD (gastroesophageal reflux disease) 06/05/2012  . Insomnia 06/05/2012  . 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 HTN (hypertension) 07/23/2011    Past Surgical History:  Procedure  Laterality Date  . LEFT HEART CATH AND CORONARY ANGIOGRAPHY N/A 04/19/2017   Procedure: Left Heart Cath and Coronary Angiography;  Surgeon: Belva Crome, MD;  Location: [facility];  Service: Cardiovascular;  Laterality: N/A;  . TONSILLECTOMY         Family History  Problem Relation Age of Onset  . Lung cancer  Father   . Brain cancer Father   . Diabetes Maternal Grandmother   . Colon polyps Neg Hx   . Esophageal cancer Neg Hx   . Pancreatic cancer Neg Hx   . Stomach cancer Neg Hx     Social History   Tobacco Use  . Smoking status: Former Smoker    Packs/day: 0.25    Years: 46.00    Pack years: 11.50    Types: Cigarettes  . Smokeless tobacco: Never Used  Vaping Use  . Vaping Use: Never used  Substance Use Topics  . Alcohol use: Yes    Alcohol/week: 3.0 standard drinks    Types: 3 Cans of beer per week    Comment: 3 beers per day  . Drug use: No    Home Medications Prior to Admission medications   Medication Sig Start Date End Date Taking? Authorizing Provider  apixaban (ELIQUIS) 5 MG TABS tablet Take 1 tablet (5 mg total) by mouth 2 (two) times daily. 04/10/21  Yes Lelon Perla, MD  albuterol (PROVENTIL) (2.5 MG/3ML) 0.083% nebulizer solution Take 3 mLs (2.5 mg total) by nebulization every 6 (six) hours as needed for wheezing or shortness of breath. 10/01/20   Collene Gobble, MD  albuterol (VENTOLIN HFA) 108 (90 Base) MCG/ACT inhaler INHALE 2 PUFFS INTO THE LUNGS EVERY 6 HOURS AS NEEDED FOR WHEEZING 02/27/21   Byrum, Rose Fillers, MD  allopurinol (ZYLOPRIM) 300 MG tablet Take 1 tablet (300 mg total) by mouth daily. 11/29/20   Janith Lima, MD  furosemide (LASIX) 40 MG tablet Take 1 tablet (40 mg total) by mouth daily. 04/15/21   Clegg, Amy D, NP  levofloxacin (LEVAQUIN) 500 MG tablet Take 1 tablet (500 mg total) by mouth daily for 10 days. 04/09/21 04/19/21  Biagio Borg, MD  Lidocaine, Anorectal, 5 % CREA Apply topically as needed.    [provider]  losartan (COZAAR) 25 MG tablet Take 0.5 tablets (12.5 mg total) by mouth at bedtime. 04/15/21   Darrick Grinder D, NP  metFORMIN (GLUCOPHAGE XR) 750 MG 24 hr tablet Take 1 tablet (750 mg total) by mouth daily with breakfast. 12/09/20   Janith Lima, MD  mometasone-formoterol (DULERA) 200-5 MCG/ACT AERO Dulera 200 mcg-5  mcg/actuation HFA aerosol inhaler  INHALE 2 PUFFS BY MOUTH FIRST THING IN THE MORNING AND THEN ANOTHER 2 PUFFS ABOUT 12 HOURS LATER 02/11/21   Collene Gobble, MD  montelukast (SINGULAIR) 10 MG tablet Take 1 tablet (10 mg total) by mouth at bedtime. 02/06/21   Biagio Borg, MD  omeprazole (PRILOSEC) 20 MG capsule Take 1 capsule (20 mg total) by mouth 2 (two) times daily before a meal. 07/28/20   Janith Lima, MD  polyethylene glycol (MIRALAX / GLYCOLAX) 17 g packet Take 17 g by mouth daily as needed. 12/30/20   Desiree Hane, MD  predniSONE (DELTASONE) 10 MG tablet Take 1 tablet (10 mg total) by mouth daily with breakfast. 12/11/20   Collene Gobble, MD  Tiotropium Bromide Monohydrate (SPIRIVA RESPIMAT) 2.5 MCG/ACT AERS Inhale 2.5 mcg into the lungs 2 (two) times daily. 12/11/20  Collene Gobble, MD  Zinc 100 MG TABS Take 1 tablet by mouth daily.    [provider]    Allergies    Clindamycin, Doxycycline, E-mycin [erythromycin base], Penicillins, and Rosuvastatin  Review of Systems   Review of Systems  Constitutional: Positive for fatigue. Negative for fever.  HENT: Negative for sore throat.   Eyes: Negative for visual disturbance.  Respiratory: Positive for shortness of breath.   Cardiovascular: Negative for chest pain (not today).  Gastrointestinal: Negative for abdominal pain, nausea and vomiting.  Genitourinary: Negative for difficulty urinating.  Skin: Negative for rash.  Neurological: Positive for light-headedness. Negative for syncope and headaches.    Physical Exam Updated Vital Signs BP (!) 142/91 (BP Location: Right Arm)   Pulse 75   Temp 97.7 F (36.5 C) (Oral)   Resp 20   Ht 5\' 10"  (1.778 m)   Wt 105 kg   SpO2 97%   BMI 33.21 kg/m   Physical Exam Vitals and nursing note reviewed.  Constitutional:      General: He is not in acute distress.    Appearance: He is well-developed. He is not diaphoretic.  HENT:     Head: Normocephalic and atraumatic.   Eyes:     Conjunctiva/sclera: Conjunctivae normal.  Cardiovascular:     Rate and Rhythm: Normal rate and regular rhythm.     Heart sounds: Normal heart sounds. No murmur heard. No friction rub. No gallop.   Pulmonary:     Effort: Pulmonary effort is normal. No respiratory distress.     Breath sounds: Normal breath sounds. No wheezing or rales.  Abdominal:     General: There is no distension.     Palpations: Abdomen is soft.     Tenderness: There is no abdominal tenderness. There is no guarding.  Musculoskeletal:     Cervical back: Normal range of motion.  Skin:    General: Skin is warm and dry.  Neurological:     Mental Status: He is alert and oriented to person, place, and time.     ED Results / Procedures / Treatments   Labs (all labs ordered are listed, but only abnormal results are displayed) Labs Reviewed  BASIC METABOLIC PANEL - Abnormal; Notable for the following components:      Result Value   Glucose, Bld 113 (*)    All other components within normal limits  BRAIN NATRIURETIC PEPTIDE - Abnormal; Notable for the following components:   B Natriuretic Peptide 345.2 (*)    All other components within normal limits  HEPATIC FUNCTION PANEL - Abnormal; Notable for the following components:   Total Protein 6.3 (*)    All other components within normal limits  CBC - Abnormal; Notable for the following components:   MCV 100.7 (*)    All other components within normal limits  TROPONIN I (HIGH SENSITIVITY) - Abnormal; Notable for the following components:   Troponin I (High Sensitivity) 18 (*)    All other components within normal limits  RESP PANEL BY RT-PCR (FLU A&B, COVID) ARPGX2  SURGICAL PCR SCREEN  CBC  PROTIME-INR  TSH  BASIC METABOLIC PANEL    EKG EKG Interpretation  Date/Time:  Tuesday April 15 2021 20:41:10 EDT Ventricular Rate:  83 PR Interval:  248 QRS Duration: 186 QT Interval:  472 QTC Calculation: 554 R Axis:   -76 Text Interpretation: Sinus  rhythm with 1st degree A-V block with frequent and consecutive Premature ventricular complexes Right bundle branch block Left anterior fascicular  block ** Bifascicular block ** Left ventricular hypertrophy with repolarization abnormality ( R in aVL , [place]ilt-Estes ) Cannot rule out Septal infarct , age undetermined Abnormal ECG Since ECG earlier today,no significant changes Confirmed by Gareth Morgan [phone]) on 04/15/2021 9:08:05 PM   Radiology DG Chest Port 1 View  Result Date: 04/15/2021 CLINICAL DATA:  65 year old male with intermittent loss of consciousness. EXAM: PORTABLE CHEST 1 VIEW COMPARISON:  Chest radiograph dated 04/09/2021. FINDINGS: Mild cardiomegaly. No focal consolidation, pleural effusion, or pneumothorax. Atherosclerotic calcification of the aorta. No acute osseous pathology. IMPRESSION: No acute cardiopulmonary process. Electronically Signed   By: Anner Crete M.D.   On: 04/15/2021 21:51    Procedures Procedures   Medications Ordered in ED Medications  nicotine (NICODERM CQ - dosed in mg/24 hours) patch 21 mg (21 mg Transdermal Patch Applied 04/15/21 2220)  allopurinol (ZYLOPRIM) tablet 300 mg (300 mg Oral Given 04/16/21 1009)  furosemide (LASIX) tablet 40 mg (40 mg Oral Given 04/16/21 1009)  losartan (COZAAR) tablet 12.5 mg (12.5 mg Oral Given 04/16/21 0241)  metFORMIN (GLUCOPHAGE-XR) 24 hr tablet 750 mg (750 mg Oral Patient Refused/Not Given 04/16/21 1014)  predniSONE (DELTASONE) tablet 10 mg (10 mg Oral Given 04/16/21 1009)  pantoprazole (PROTONIX) EC tablet 40 mg (40 mg Oral Given 04/16/21 1009)  polyethylene glycol (MIRALAX / GLYCOLAX) packet 17 g (has no administration in time range)  albuterol (VENTOLIN HFA) 108 (90 Base) MCG/ACT inhaler 2 puff (2 puffs Inhalation Given 04/16/21 0246)  mometasone-formoterol (DULERA) 200-5 MCG/ACT inhaler 2 puff (2 puffs Inhalation Not Given 04/16/21 1015)  montelukast (SINGULAIR) tablet 10 mg (10 mg Oral Not Given 04/16/21 0248)   sodium chloride flush (NS) 0.9 % injection 3 mL (3 mLs Intravenous Not Given 04/16/21 1013)  sodium chloride flush (NS) 0.9 % injection 3 mL (10 mLs Intravenous Not Given 04/16/21 1013)  sodium chloride flush (NS) 0.9 % injection 3 mL (has no administration in time range)  0.9 %  sodium chloride infusion (has no administration in time range)  umeclidinium bromide (INCRUSE ELLIPTA) 62.5 MCG/INH 1 puff (1 puff Inhalation Not Given 04/16/21 1014)  0.9 %  sodium chloride infusion (has no administration in time range)  gentamicin (GARAMYCIN) 80 mg in sodium chloride 0.9 % 500 mL irrigation (has no administration in time range)  chlorhexidine (HIBICLENS) 4 % liquid 4 application (has no administration in time range)  sodium chloride flush (NS) 0.9 % injection 3 mL (3 mLs Intravenous Given 04/16/21 1012)  sodium chloride flush (NS) 0.9 % injection 3 mL (has no administration in time range)  0.9 %  sodium chloride infusion (has no administration in time range)  vancomycin (VANCOREADY) IVPB 1500 mg/300 mL (has no administration in time range)  potassium chloride SA (KLOR-CON) CR tablet 20 mEq (20 mEq Oral Given 04/16/21 1009)    ED Course  I have reviewed the triage vital signs and the nursing notes.  Pertinent labs & imaging results that were available during my care of the patient were reviewed by me and considered in my medical decision making (see chart for details).    MDM Rules/Calculators/A&P                          65 year old male with a history of hypertension, COPD, chronic systolic congestive heart failure, ejection fraction of 40% with normal coronaries on catheterization 4/18, who has been followed by cardiology for his congestive heart failure, atrial fibrillation, and syncopal episodes and had a  [employer] monitor in place which showed third-degree heart block and was advised to come to ED for PPM placement.  No significant electrolyte abnormalities.  Is in sinus rhythm on arrival to ED.   Pads place in case he again converts to 3rd degree and requires pacing.  Contacted Dr. Radford Pax who will admit for further care.    Final Clinical Impression(s) / ED Diagnoses Final diagnoses:  Near syncope  Third degree heart block [facility])    Rx / DC Orders ED Discharge Orders    None       Gareth Morgan, MD 04/16/21 1130

## 2021-04-15 NOTE — Telephone Encounter (Signed)
Fax received from [employer] with strips of episode earlier today showing 3rd Degree Heart Block. Per note below patient was symptomatic at the time. Reviewed strips with DOD Dr. Quentin Ore. Per Dr. Quentin Ore patient is to go to the ER for PPM implant. Called and made patient aware of recommendations. Patient verbalized understanding and thanked me for the call. Strips given to HIM to scan to the patient's chart.

## 2021-04-15 NOTE — Telephone Encounter (Signed)
Spoke with the patient who wanted to know what he should tell them when he gets to the ER. Advised the patient that he should tell them that his cardiologist sent him for PPM implant. Advised patient that they will be able to pull up his chart and information on why he was sent there. Patient verbalized understanding and is waiting on his ride to come take him to the ER.

## 2021-04-15 NOTE — Telephone Encounter (Signed)
Call received from [facility] [place] stating that his team could not get through on the phone lines. He states that the patient had an episode of 3rd Degree Heart Block today at 11:47 AM CST. He states that they will fax over strips. Monitor was ordered for Dr. Gwenlyn Found patient by Coletta Memos, PA for dizziness/syncope. Will forward to triage for review with patient and MD.

## 2021-04-15 NOTE — Progress Notes (Signed)
Please call patient and let them  know their  low dose Ct was read as a Lung RADS 2: nodules that are benign in appearance and behavior with a very low likelihood of becoming a clinically active cancer due to size or lack of growth. Recommendation per radiology is for a repeat LDCT in 12 months. Please let them  know we will order and schedule their  annual screening scan for 03/2022. Please let them  know there was notation of CAD on their  scan.  Please remind the patient  that this is a non-gated exam therefore degree or severity of disease  cannot be determined. Please have them  follow up with their PCP regarding potential risk factor modification, dietary therapy or pharmacologic therapy if clinically indicated. Pt.  is not  currently on statin therapy. Please place order for annual  screening scan for  03/2022 and fax results to PCP. Thanks so much. 

## 2021-04-15 NOTE — Progress Notes (Signed)
ADVANCED HF CLINIC NOTE  MD: Dr. Haroldine Laws  Reason for Visit: Heart Failure   HPI: Robert Church is a 65 y/o male with h/o obesity, HTN, COPD with ongoing tobacco use and chronic systolic HF.    He underwent cath in 4/18 due to CP. Cath showed normal coronaries with EF 40% and global HK. LVEDP 19. He was referred for a sleep study.  He saw Dr. Gwenlyn Found on 07/01/2018 because of recurrent chest pain.  Myoview stress test performed 07/21/2018 showed EF 38% inferior scar with septal ischemia.  A 2D echo performed 07/07/2018 revealed an EF of 30 to 35%.  He was referred for cardiac CT.   Cardiac CT on 08/26/18.  Calcium score 148 (74th percentile)  LAD 25% otherwise normal cors. Dilated pulmonary arteries suggestive of PH.   Works for [employer]. Follows with [name] in Pulmonary Clinic. Was started on bisoprolol 2.5 but couldn't tolerate due to fatigue. Was falling asleep at work. Stopped bisoprolol and felt better.   Echo 12/20 EF improved to 45%. Mild RV dysfunction   Recently admitted 1/22 for acute on chronic hypoxic respiratory failure secondary to acute COPDE and a/c CHF w/ volume overload. He had massive diuresis w/ IV Lasix, diuresed down from admit wt of 265 lb to 210 lb. Echo was repeated 12/21, EF 40-45% (unchanged). RV mildly reduced, right ventricular systolic pressure was 89.2 mmHg. ReDs clip was 33%. BP was soft but stable. No med changes were made.   Saw Dr Haroldine Laws 03/04/21 and Reported recent 7 lb wt gain and increased DOE. Also bendopnea. ReDs Clip elevated at 46%. BP 130/70. Arlyce Harman was increased 25 and added Jardiance 10 mg daily (Hgb A1c was 6.5). Of note spiro 25 mg was not tolerated due to dizziness so he cut back to 12.5 mg daily.   He returned to the HF clinic 03/31/21 . At that time he was stable was set up for 3 month follow up.   He had follow up 04/03/21 with general cardiology and reported dizziness associated with syncope x2. Cardiac Monitor placed.    On  04/11/21 had Carotids checked and this showed near-normal plaque.   Had episode of A fib noted monitor 4/21 and was started on eliquis. Also referred to A fib clinic.   Yesterday he was seen in the A fib clinic for new onset of A fib.   He is not on BB as he did not tolerate bisoprol which was  tried in  the past. Has syncope on a few occasions. During his visit he reported ongoing dizziness/presyncope.  He was very concerned about volume overload so he was asked to follow up in the HF clinic today. Plan was to continue 30 day [employer] Live monitor.   Today he returns for an acute HF work in for possible volume overload. Overall feeling ok but worried because he had a " mild episode"  today. Says he bent over then had episode of neck pain going to the back of his head which was followed by being lightheaded. Says it lasted about 5 seconds. He checked his 02 sats/pulse -->93% and pulse was 39. SOB with exertion. + Bendopnea. Denies PND/Orthopnea. Using Bipap at night. Appetite ok. No fever or chills. Weight at home 220-222 pounds. He has been off spiro, torsemide losartan and jardiance since 04/09/21. Says he feels much off the medications.  Lives alone.   Past Medical History:  Diagnosis Date  . Adenomatous colon polyp   . Allergy   .  Anal fissure   . Asthma   . Bronchitis   . CHF (congestive heart failure) ([place])   . COPD (chronic obstructive pulmonary disease) ([place])   . Emphysema lung ([place])   . GERD (gastroesophageal reflux disease)   . Hyperlipidemia   . Hypertension   . OSA (obstructive sleep apnea)   . Pneumonia   . Sinusitis   . SOB (shortness of breath)     Current Outpatient Medications  Medication Sig Dispense Refill  . albuterol (PROVENTIL) (2.5 MG/3ML) 0.083% nebulizer solution Take 3 mLs (2.5 mg total) by nebulization every 6 (six) hours as needed for wheezing or shortness of breath. 75 mL 12  . albuterol (VENTOLIN HFA) 108 (90 Base) MCG/ACT inhaler INHALE 2 PUFFS INTO THE  LUNGS EVERY 6 HOURS AS NEEDED FOR WHEEZING 18 g 2  . allopurinol (ZYLOPRIM) 300 MG tablet Take 1 tablet (300 mg total) by mouth daily. 90 tablet 1  . apixaban (ELIQUIS) 5 MG TABS tablet Take 1 tablet (5 mg total) by mouth 2 (two) times daily. 60 tablet 3  . levofloxacin (LEVAQUIN) 500 MG tablet Take 1 tablet (500 mg total) by mouth daily for 10 days. 10 tablet 0  . Lidocaine, Anorectal, 5 % CREA Apply topically as needed.    . metFORMIN (GLUCOPHAGE XR) 750 MG 24 hr tablet Take 1 tablet (750 mg total) by mouth daily with breakfast. 90 tablet 1  . mometasone-formoterol (DULERA) 200-5 MCG/ACT AERO Dulera 200 mcg-5 mcg/actuation HFA aerosol inhaler  INHALE 2 PUFFS BY MOUTH FIRST THING IN THE MORNING AND THEN ANOTHER 2 PUFFS ABOUT 12 HOURS LATER 1 each 2  . montelukast (SINGULAIR) 10 MG tablet Take 1 tablet (10 mg total) by mouth at bedtime. 30 tablet 5  . omeprazole (PRILOSEC) 20 MG capsule Take 1 capsule (20 mg total) by mouth 2 (two) times daily before a meal. 180 capsule 1  . polyethylene glycol (MIRALAX / GLYCOLAX) 17 g packet Take 17 g by mouth daily as needed. 14 each 0  . predniSONE (DELTASONE) 10 MG tablet Take 1 tablet (10 mg total) by mouth daily with breakfast. 30 tablet 2  . Tiotropium Bromide Monohydrate (SPIRIVA RESPIMAT) 2.5 MCG/ACT AERS Inhale 2.5 mcg into the lungs 2 (two) times daily. 4 g 11  . Zinc 100 MG TABS Take 1 tablet by mouth daily.     No current facility-administered medications for this encounter.    Allergies  Allergen Reactions  . Clindamycin Other (See Comments)    Tongue swelling  . Doxycycline     Severe stomach upset per patient  . E-Mycin [Erythromycin Base] Swelling  . Penicillins Swelling and Other (See Comments)    Childhood rxn--MD stated he "almost died" Has patient had a PCN reaction causing immediate rash, facial/tongue/throat swelling, SOB or lightheadedness with hypotension:Yes Has patient had a PCN reaction causing severe rash involving mucus  membranes or skin necrosis:unsure Has patient had a PCN reaction that required hospitalization:unsure Has patient had a PCN reaction occurring within the last 10 years:No If all of the above answers are "NO", then may proceed with Cephalosporin use.    . Rosuvastatin     Other reaction(s): Myalgias (intolerance)      Social History   Socioeconomic History  . Marital status: Single    Spouse name: Not on file  . Number of children: Not on file  . Years of education: Not on file  . Highest education level: Not on file  Occupational History  . Occupation: Dealer  Tobacco Use  . Smoking status: Former Smoker    Packs/day: 0.25    Years: 46.00    Pack years: 11.50    Types: Cigarettes  . Smokeless tobacco: Never Used  Vaping Use  . Vaping Use: Never used  Substance and Sexual Activity  . Alcohol use: Yes    Alcohol/week: 3.0 standard drinks    Types: 3 Cans of beer per week    Comment: 3 beers per day  . Drug use: No  . Sexual activity: Yes    Birth control/protection: None  Other Topics Concern  . Not on file  Social History Narrative  . Not on file   Social Determinants of Health   Financial Resource Strain: Not on file  Food Insecurity: Not on file  Transportation Needs: Not on file  Physical Activity: Not on file  Stress: Not on file  Social Connections: Not on file  Intimate Partner Violence: Not on file      Family History  Problem Relation Age of Onset  . Lung cancer Father   . Brain cancer Father   . Diabetes Maternal Grandmother   . Colon polyps Neg Hx   . Esophageal cancer Neg Hx   . Pancreatic cancer Neg Hx   . Stomach cancer Neg Hx     Vitals:   04/15/21 1416  BP: (!) 142/98  Pulse: 73  SpO2: 92%  Weight: 103.9 kg    ReDS Vest / Clip - 04/15/21 1400      ReDS Vest / Clip   Station Marker C    Ruler Value 31    ReDS Value Range Moderate volume overload    ReDS Actual Value 38            PHYSICAL EXAM: General:  Walked in  the clinic. No resp difficulty HEENT: normal Neck: supple. JVP 9-10.  Carotids 2+ bilat; no bruits. No lymphadenopathy or thryomegaly appreciated. Cor: PMI nondisplaced. Regular rate & rhythm. No rubs, gallops or murmurs. Lungs: clear Abdomen: soft, nontender, distended. No hepatosplenomegaly. No bruits or masses. Good bowel sounds. Extremities: no cyanosis, clubbing, rash, edema Neuro: alert & orientedx3, cranial nerves grossly intact. moves all 4 extremities w/o difficulty. Affect pleasant & oriented x 3, cranial nerves grossly intact. moves all 4 extremities w/o difficulty. Affect pleasant.   ASSESSMENT & PLAN: 1. Chronic systolic HF due to NICM  - cath 4/18 normal cors. EF 48%  - Myoview 07/21/2018 showed EF 38% inferior scar with septal ischemia.   - Echo 07/07/2018 revealed EF 30-35%. RV normal.  - Cardiac CT 08/26/18.  Calcium score 148 (74th percentile)  LAD 25% otherwise normal cors. Dilated pulmonary arteries suggestive of PH.  - Suspect NICM due to HTN and inadequately treated OSA (He now reports improved nightly compliance w/ CPAP). - Echo 12/20 EF 45% mild RV dysfunction - Recent admit for a/c CHF 1/21, EF unchanged 40-45%. RV mildly reduced. RVSP 49 - Chronically NYHA confounded by COPD and obesity/ deconditioning - Reds Clip 38%. NYHA III. Start lasix 40 mg daily. Keep off torsemide.  - We also discussed cardiomems but need to sort arrhythmias first.  - Start losartan 12.5 mg at bedtime.  -  NO BB with new 3rd degree heart block.   - 2. HTN - Add losartan 12.5 mg at bed time.   3. COPD  -  has quit smoking - He is prednisone 10 mg daily chronically.  -  Follows with Pulmonary ([name]) -  home O2 PRN-  Walked in the clinic with stable sats but he was short of breath with exertion.  -  continue BiPAP   4. OSA -Continue Bipap every night.   5. Morbid obesity  Body mass index is 32.86 kg/m.   6. Pulmonary Nodules - followed by [name] - recent CT scan 3/25  stable, annual screening advised.   7. A Fib  -Followed in the A fib Clinic  -Now wearing 30 day monitor.  - On eliquis 5 mg twice a day   8. Syncope He was instructed to not drive for 6 months.  - I personally discussed with A fib clinic the episode he had earlier to review possible arrhythmia. A fib received alert for 3rd degree heart block. A Fib clinic referred to EP clinic for Urgent Appointment.  - EKG during office visit --> SR 1degree HB with PACs and occasional PVCs.  -He was instructed to call 911 if this happens again.   Follow up in 7 days for BMET and 4 weeks with Dr Haroldine Laws.    Darrick Grinder, NP  2:28 PM

## 2021-04-16 ENCOUNTER — Observation Stay (HOSPITAL_COMMUNITY): Payer: 59

## 2021-04-16 ENCOUNTER — Inpatient Hospital Stay (HOSPITAL_COMMUNITY)
Admission: EM | Disposition: A | Discharge status: Home / Self Care | Payer: Self-pay | Source: Home / Self Care | Attending: Cardiology

## 2021-04-16 ENCOUNTER — Encounter: Payer: Self-pay | Admitting: *Deleted

## 2021-04-16 ENCOUNTER — Other Ambulatory Visit: Payer: Self-pay

## 2021-04-16 DIAGNOSIS — R55 Syncope and collapse: Secondary | ICD-10-CM | POA: Diagnosis present

## 2021-04-16 DIAGNOSIS — Z7901 Long term (current) use of anticoagulants: Secondary | ICD-10-CM | POA: Diagnosis not present

## 2021-04-16 DIAGNOSIS — Z79899 Other long term (current) drug therapy: Secondary | ICD-10-CM | POA: Diagnosis not present

## 2021-04-16 DIAGNOSIS — Z87891 Personal history of nicotine dependence: Secondary | ICD-10-CM | POA: Diagnosis not present

## 2021-04-16 DIAGNOSIS — Z808 Family history of malignant neoplasm of other organs or systems: Secondary | ICD-10-CM | POA: Diagnosis not present

## 2021-04-16 DIAGNOSIS — Z20822 Contact with and (suspected) exposure to covid-19: Secondary | ICD-10-CM | POA: Diagnosis present

## 2021-04-16 DIAGNOSIS — I4892 Unspecified atrial flutter: Secondary | ICD-10-CM | POA: Diagnosis not present

## 2021-04-16 DIAGNOSIS — I453 Trifascicular block: Secondary | ICD-10-CM | POA: Diagnosis present

## 2021-04-16 DIAGNOSIS — R5383 Other fatigue: Secondary | ICD-10-CM | POA: Diagnosis present

## 2021-04-16 DIAGNOSIS — Z9981 Dependence on supplemental oxygen: Secondary | ICD-10-CM | POA: Diagnosis not present

## 2021-04-16 DIAGNOSIS — I5022 Chronic systolic (congestive) heart failure: Secondary | ICD-10-CM

## 2021-04-16 DIAGNOSIS — I442 Atrioventricular block, complete: Secondary | ICD-10-CM | POA: Diagnosis present

## 2021-04-16 DIAGNOSIS — Z888 Allergy status to other drugs, medicaments and biological substances status: Secondary | ICD-10-CM | POA: Diagnosis not present

## 2021-04-16 DIAGNOSIS — I110 Hypertensive heart disease with heart failure: Secondary | ICD-10-CM | POA: Diagnosis present

## 2021-04-16 DIAGNOSIS — M542 Cervicalgia: Secondary | ICD-10-CM | POA: Diagnosis present

## 2021-04-16 DIAGNOSIS — I4891 Unspecified atrial fibrillation: Secondary | ICD-10-CM | POA: Diagnosis not present

## 2021-04-16 DIAGNOSIS — Z7952 Long term (current) use of systemic steroids: Secondary | ICD-10-CM | POA: Diagnosis not present

## 2021-04-16 DIAGNOSIS — Z880 Allergy status to penicillin: Secondary | ICD-10-CM | POA: Diagnosis not present

## 2021-04-16 DIAGNOSIS — R519 Headache, unspecified: Secondary | ICD-10-CM | POA: Diagnosis present

## 2021-04-16 DIAGNOSIS — Z7984 Long term (current) use of oral hypoglycemic drugs: Secondary | ICD-10-CM | POA: Diagnosis not present

## 2021-04-16 DIAGNOSIS — I480 Paroxysmal atrial fibrillation: Secondary | ICD-10-CM | POA: Diagnosis present

## 2021-04-16 DIAGNOSIS — I428 Other cardiomyopathies: Secondary | ICD-10-CM | POA: Diagnosis present

## 2021-04-16 DIAGNOSIS — J439 Emphysema, unspecified: Secondary | ICD-10-CM | POA: Diagnosis present

## 2021-04-16 DIAGNOSIS — Z86010 Personal history of colonic polyps: Secondary | ICD-10-CM | POA: Diagnosis not present

## 2021-04-16 DIAGNOSIS — Z881 Allergy status to other antibiotic agents status: Secondary | ICD-10-CM | POA: Diagnosis not present

## 2021-04-16 DIAGNOSIS — G4733 Obstructive sleep apnea (adult) (pediatric): Secondary | ICD-10-CM | POA: Diagnosis present

## 2021-04-16 HISTORY — PX: PACEMAKER IMPLANT: EP1218

## 2021-04-16 LAB — ECHOCARDIOGRAM COMPLETE
AR max vel: 2.39 cm2
AV Area VTI: 2.24 cm2
AV Area mean vel: 2.19 cm2
AV Mean grad: 3.7 mmHg
AV Peak grad: 6.4 mmHg
Ao pk vel: 1.26 m/s
Area-P 1/2: 6.6 cm2
Calc EF: 44.4 %
Height: 70 in
S' Lateral: 5.3 cm
Single Plane A2C EF: 44.1 %
Single Plane A4C EF: 44.4 %
Weight: 3703.73 oz

## 2021-04-16 LAB — CBC
HCT: 46 % (ref 39.0–52.0)
Hemoglobin: 15.4 g/dL (ref 13.0–17.0)
MCH: 33.7 pg (ref 26.0–34.0)
MCHC: 33.5 g/dL (ref 30.0–36.0)
MCV: 100.7 fL — ABNORMAL HIGH (ref 80.0–100.0)
Platelets: 170 10*3/uL (ref 150–400)
RBC: 4.57 MIL/uL (ref 4.22–5.81)
RDW: 13.3 % (ref 11.5–15.5)
WBC: 7.3 10*3/uL (ref 4.0–10.5)
nRBC: 0 % (ref 0.0–0.2)

## 2021-04-16 LAB — SURGICAL PCR SCREEN
MRSA, PCR: NEGATIVE
Staphylococcus aureus: NEGATIVE

## 2021-04-16 LAB — BASIC METABOLIC PANEL
Anion gap: 10 (ref 5–15)
BUN: 15 mg/dL (ref 8–23)
CO2: 25 mmol/L (ref 22–32)
Calcium: 9.2 mg/dL (ref 8.9–10.3)
Chloride: 104 mmol/L (ref 98–111)
Creatinine, Ser: 0.82 mg/dL (ref 0.61–1.24)
GFR, Estimated: >60 mL/min (ref >60)
Glucose, Bld: 92 mg/dL (ref 70–99)
Potassium: 3.9 mmol/L (ref 3.5–5.1)
Sodium: 139 mmol/L (ref 135–145)

## 2021-04-16 LAB — TSH: TSH: 1.147 u[IU]/mL (ref 0.350–4.500)

## 2021-04-16 SURGERY — PACEMAKER IMPLANT

## 2021-04-16 MED ORDER — MIDAZOLAM HCL 5 MG/5ML IJ SOLN
INTRAMUSCULAR | Status: DC | PRN
  Administered 2021-04-16 (×2): 1 mg via INTRAVENOUS

## 2021-04-16 MED ORDER — LIDOCAINE HCL 1 % IJ SOLN
INTRAMUSCULAR | Status: AC
  Filled 2021-04-16: qty 60

## 2021-04-16 MED ORDER — LIDOCAINE HCL (PF) 1 % IJ SOLN
INTRAMUSCULAR | Status: DC | PRN
  Administered 2021-04-16: 60 mL

## 2021-04-16 MED ORDER — SODIUM CHLORIDE 0.9% FLUSH: 3.0000 mL | INTRAVENOUS | Status: DC | PRN

## 2021-04-16 MED ORDER — FUROSEMIDE 40 MG PO TABS
40.0000 mg | ORAL_TABLET | Freq: Every day | ORAL | Status: DC
  Administered 2021-04-16 – 2021-04-17 (×2): 40 mg via ORAL
  Filled 2021-04-16 (×2): qty 1

## 2021-04-16 MED ORDER — FENTANYL CITRATE (PF) 100 MCG/2ML IJ SOLN
INTRAMUSCULAR | Status: AC
  Filled 2021-04-16: qty 2

## 2021-04-16 MED ORDER — ALBUTEROL SULFATE HFA 108 (90 BASE) MCG/ACT IN AERS
2.0000 | INHALATION_SPRAY | Freq: Four times a day (QID) | RESPIRATORY_TRACT | Status: DC | PRN
  Administered 2021-04-16: 2 via RESPIRATORY_TRACT
  Filled 2021-04-16: qty 6.7

## 2021-04-16 MED ORDER — FENTANYL CITRATE (PF) 100 MCG/2ML IJ SOLN
INTRAMUSCULAR | Status: DC | PRN
  Administered 2021-04-16 (×2): 25 ug via INTRAVENOUS

## 2021-04-16 MED ORDER — POLYETHYLENE GLYCOL 3350 17 G PO PACK: 17.0000 g | PACK | Freq: Every day | ORAL | Status: DC | PRN

## 2021-04-16 MED ORDER — ALBUTEROL SULFATE (2.5 MG/3ML) 0.083% IN NEBU
2.5000 mg | INHALATION_SOLUTION | Freq: Four times a day (QID) | RESPIRATORY_TRACT | Status: DC | PRN
  Administered 2021-04-16: 2.5 mg via RESPIRATORY_TRACT
  Filled 2021-04-16: qty 3

## 2021-04-16 MED ORDER — LIDOCAINE HCL (PF) 1 % IJ SOLN
INTRAMUSCULAR | Status: AC
  Filled 2021-04-16: qty 30

## 2021-04-16 MED ORDER — HEPARIN (PORCINE) IN NACL 1000-0.9 UT/500ML-% IV SOLN
INTRAVENOUS | Status: DC | PRN
  Administered 2021-04-16: 500 mL

## 2021-04-16 MED ORDER — CHLORHEXIDINE GLUCONATE 4 % EX LIQD
CUTANEOUS | Status: AC
  Filled 2021-04-16: qty 15

## 2021-04-16 MED ORDER — SODIUM CHLORIDE 0.9% FLUSH: 3.0000 mL | Freq: Two times a day (BID) | INTRAVENOUS | Status: DC

## 2021-04-16 MED ORDER — POTASSIUM CHLORIDE CRYS ER 20 MEQ PO TBCR
20.0000 meq | EXTENDED_RELEASE_TABLET | Freq: Once | ORAL | Status: AC
  Administered 2021-04-16: 20 meq via ORAL
  Filled 2021-04-16: qty 1

## 2021-04-16 MED ORDER — MIDAZOLAM HCL 5 MG/5ML IJ SOLN
INTRAMUSCULAR | Status: AC
  Filled 2021-04-16: qty 5

## 2021-04-16 MED ORDER — HEPARIN (PORCINE) IN NACL 1000-0.9 UT/500ML-% IV SOLN
INTRAVENOUS | Status: AC
  Filled 2021-04-16: qty 500

## 2021-04-16 MED ORDER — CHLORHEXIDINE GLUCONATE 4 % EX LIQD
60.0000 mL | Freq: Once | CUTANEOUS | Status: AC
  Filled 2021-04-16: qty 15

## 2021-04-16 MED ORDER — MONTELUKAST SODIUM 10 MG PO TABS
10.0000 mg | ORAL_TABLET | Freq: Every day | ORAL | Status: DC
  Administered 2021-04-16: 10 mg via ORAL
  Filled 2021-04-16 (×2): qty 1

## 2021-04-16 MED ORDER — MOMETASONE FURO-FORMOTEROL FUM 200-5 MCG/ACT IN AERO
2.0000 | INHALATION_SPRAY | Freq: Two times a day (BID) | RESPIRATORY_TRACT | Status: DC
  Administered 2021-04-16 – 2021-04-17 (×2): 2 via RESPIRATORY_TRACT
  Filled 2021-04-16 (×2): qty 8.8

## 2021-04-16 MED ORDER — ALLOPURINOL 300 MG PO TABS
300.0000 mg | ORAL_TABLET | Freq: Every day | ORAL | Status: DC
  Administered 2021-04-16 – 2021-04-17 (×2): 300 mg via ORAL
  Filled 2021-04-16 (×3): qty 1

## 2021-04-16 MED ORDER — SODIUM CHLORIDE 0.9 % IV SOLN
INTRAVENOUS | Status: AC
  Filled 2021-04-16: qty 2

## 2021-04-16 MED ORDER — LOSARTAN POTASSIUM 25 MG PO TABS
12.5000 mg | ORAL_TABLET | Freq: Every day | ORAL | Status: DC
  Administered 2021-04-16 (×2): 12.5 mg via ORAL
  Filled 2021-04-16: qty 0.5
  Filled 2021-04-16: qty 1

## 2021-04-16 MED ORDER — UMECLIDINIUM BROMIDE 62.5 MCG/INH IN AEPB
1.0000 | INHALATION_SPRAY | Freq: Every day | RESPIRATORY_TRACT | Status: DC
  Filled 2021-04-16 (×2): qty 7

## 2021-04-16 MED ORDER — SODIUM CHLORIDE 0.9 % IV SOLN
80.0000 mg | INTRAVENOUS | Status: AC
  Administered 2021-04-16: 80 mg
  Filled 2021-04-16: qty 2

## 2021-04-16 MED ORDER — HEPARIN SODIUM (PORCINE) 5000 UNIT/ML IJ SOLN
5000.0000 [IU] | Freq: Three times a day (TID) | INTRAMUSCULAR | Status: DC
  Administered 2021-04-16: 5000 [IU] via SUBCUTANEOUS
  Filled 2021-04-16: qty 1

## 2021-04-16 MED ORDER — METFORMIN HCL ER 750 MG PO TB24
750.0000 mg | ORAL_TABLET | Freq: Every day | ORAL | Status: DC
Start: 2021-04-16 — End: 2021-04-17
  Administered 2021-04-17: 750 mg via ORAL
  Filled 2021-04-16 (×2): qty 1

## 2021-04-16 MED ORDER — CHLORHEXIDINE GLUCONATE 4 % EX LIQD: 60.0000 mL | Freq: Once | CUTANEOUS | Status: DC

## 2021-04-16 MED ORDER — SODIUM CHLORIDE 0.9 % IV SOLN: 250.0000 mL | INTRAVENOUS | Status: DC

## 2021-04-16 MED ORDER — VANCOMYCIN HCL 1000 MG/200ML IV SOLN
1000.0000 mg | Freq: Two times a day (BID) | INTRAVENOUS | Status: AC
  Administered 2021-04-17: 1000 mg via INTRAVENOUS
  Filled 2021-04-16: qty 200

## 2021-04-16 MED ORDER — SODIUM CHLORIDE 0.9 % IV SOLN: INTRAVENOUS | Status: DC

## 2021-04-16 MED ORDER — VANCOMYCIN HCL 1500 MG/300ML IV SOLN
1500.0000 mg | INTRAVENOUS | Status: AC
  Administered 2021-04-16: 1500 mg via INTRAVENOUS
  Filled 2021-04-16 (×2): qty 300

## 2021-04-16 MED ORDER — SODIUM CHLORIDE 0.9 % IV SOLN: 250.0000 mL | INTRAVENOUS | Status: DC | PRN

## 2021-04-16 MED ORDER — NICOTINE 21 MG/24HR TD PT24
21.0000 mg | MEDICATED_PATCH | Freq: Every day | TRANSDERMAL | Status: DC
  Administered 2021-04-16 – 2021-04-17 (×2): 21 mg via TRANSDERMAL
  Filled 2021-04-16 (×2): qty 1

## 2021-04-16 MED ORDER — PREDNISONE 10 MG PO TABS
10.0000 mg | ORAL_TABLET | Freq: Every day | ORAL | Status: DC
  Administered 2021-04-16 – 2021-04-17 (×2): 10 mg via ORAL
  Filled 2021-04-16 (×2): qty 1

## 2021-04-16 MED ORDER — ALBUTEROL SULFATE (2.5 MG/3ML) 0.083% IN NEBU: 2.5000 mg | INHALATION_SOLUTION | Freq: Four times a day (QID) | RESPIRATORY_TRACT | Status: DC | PRN

## 2021-04-16 MED ORDER — TIOTROPIUM BROMIDE MONOHYDRATE 2.5 MCG/ACT IN AERS: 2.5000 ug | INHALATION_SPRAY | Freq: Two times a day (BID) | RESPIRATORY_TRACT | Status: DC

## 2021-04-16 MED ORDER — SODIUM CHLORIDE 0.9% FLUSH
3.0000 mL | Freq: Two times a day (BID) | INTRAVENOUS | Status: DC
  Administered 2021-04-16: 3 mL via INTRAVENOUS

## 2021-04-16 MED ORDER — ACETAMINOPHEN 325 MG PO TABS
325.0000 mg | ORAL_TABLET | ORAL | Status: DC | PRN
  Administered 2021-04-16 – 2021-04-17 (×2): 650 mg via ORAL
  Filled 2021-04-16 (×2): qty 2

## 2021-04-16 MED ORDER — LEVOFLOXACIN 500 MG PO TABS
500.0000 mg | ORAL_TABLET | Freq: Every day | ORAL | Status: DC
  Filled 2021-04-16: qty 1

## 2021-04-16 MED ORDER — PANTOPRAZOLE SODIUM 40 MG PO TBEC
40.0000 mg | DELAYED_RELEASE_TABLET | Freq: Every day | ORAL | Status: DC
  Administered 2021-04-16 – 2021-04-17 (×2): 40 mg via ORAL
  Filled 2021-04-16 (×2): qty 1

## 2021-04-16 SURGICAL SUPPLY — 8 items
CABLE SURGICAL S-101-97-12 (CABLE) ×2 IMPLANT
LEAD TENDRIL MRI 52CM LPA1200M (Lead) ×1 IMPLANT
LEAD TENDRIL MRI 58CM LPA1200M (Lead) ×1 IMPLANT
PACEMAKER ASSURITY DR-RF (Pacemaker) ×1 IMPLANT
PAD PRO RADIOLUCENT 2001M-C (PAD) ×2 IMPLANT
SHEATH 8FR PRELUDE SNAP 13 (SHEATH) ×2 IMPLANT
SHEATH PROBE COVER 6X72 (BAG) ×1 IMPLANT
TRAY PACEMAKER INSERTION (PACKS) ×2 IMPLANT

## 2021-04-16 NOTE — Plan of Care (Signed)

## 2021-04-16 NOTE — Discharge Summary (Addendum)
ELECTROPHYSIOLOGY PROCEDURE DISCHARGE SUMMARY    Patient ID: Robert Church,  MRN: [id number], DOB/AGE: 1956/09/12 65 y.o.  Admit date: 04/15/2021 Discharge date: 04/17/21  Primary Care Physician: Janith Lima, MD  Primary Cardiologist: Dr. Gwenlyn Found AHF: Dr. Haroldine Laws Electrophysiologist: new, Dr. Curt Bears  Primary Discharge Diagnosis:  1. CHB 2. Syncope 3. ? New Afib  Secondary Discharge Diagnosis:  1. NICM 2. Chronic CHF (systolic) 3. HTN 4. COPD     Prn home o2 5. OSA     W/BPAP 6.  RBBB/Trifascicular block  Allergies  Allergen Reactions   Clindamycin Other (See Comments)    Tongue swelling   Doxycycline     Severe stomach upset per patient   E-Mycin [Erythromycin Base] Swelling   Penicillins Swelling and Other (See Comments)    Childhood rxn--MD stated he "almost died" Has patient had a PCN reaction causing immediate rash, facial/tongue/throat swelling, SOB or lightheadedness with hypotension:Yes Has patient had a PCN reaction causing severe rash involving mucus membranes or skin necrosis:unsure Has patient had a PCN reaction that required hospitalization:unsure Has patient had a PCN reaction occurring within the last 10 years:No If all of the above answers are "NO", then may proceed with Cephalosporin use.     Rosuvastatin     Other reaction(s): Myalgias (intolerance)     Procedures This Admission:  1.  Implantation of a SJM dual chamber PPM on 04/16/21 by Dr Curt Bears.  The patient received  [facility] Medical Assurity MRI  model [id number] (serial number  [id number] ) pacemaker, [employer] model Tendril MRI [id number] (serial number  [id number]) right atrial lead and an [employer] model Tendril MRI [id number] (serial number  [id number]) right ventricular lead There were no immediate post procedure complications. 2.  CXR on 04/17/21 demonstrated no pneumothorax status post device implantation.   Brief HPI: Robert Church is a 65 y.o. male w/PMHx as above was seen  by cardiology team outpt a week or so ago with reports of dizzy spells and 2 syncopal events planned for monitoring.   04/10/21 telephone notes with reports from [employer] reporting new onset AF, pt was mowing the grass and reported feeling a bit lightheaded at the time, in review by DOD felt to be AFlutter started on Eliquis and referred to the AFib clinic Was started recently by his PMD on levaquin for bronchitis with reports of some SOB.  Low BP apparently at his PMD visit and held his losartan and spiro it seems that day?    He saw the Afib clinic 04/14/21, historically intolerant of betablockers with marked fatigue. Reported weeks of symptoms of dizziness, syncope suspect to be his meds and had been holding torsemide, losartan and losartan Recent carotid US without significant disease. No changes were made and planned to get HF team follow up to review his meds/diuretics.    Saw the HF team yesterday given off his meds and concerns of recurrent HF.  Restarted furosemide, resume HS losartan, planned to stay of torsemide.  During this visit seems made aware of  CHB episode EP doc reviewed tracings and advised to go to the ER for further management.     Hospital Course:  The patient was admitted, labs unrevealing and not on any nodal blocking agents.  EP team saw him, LVEF only a few months ago 40-45% (chronically for 3-4 years), given intermittent CHB not felt he Robert Church pace to often and not planned for CRT pacing.  HF team saw him and not felt to  be overtly volume OL.  Though did order an echo, resulted POST pacing with LVEF 30-35% and GDMT is being resumed at discharge (much was stopped independently by the patient previously) He underwent implantation of a PPM with details as outlined above.  He was monitored on telemetry overnight which demonstrated SR intermittently tracking 120's.  Left chest was without hematoma or ecchymosis.  The device was interrogated and found to be functioning  normally, tachy events were captured and suspect to be tracking retrograde P waves, intermittently, no AFib.  Deviece was programmed DDI to avoid this.  CXR was obtained and demonstrated no pneumothorax status post device implantation.  Wound care, arm mobility, and restrictions were reviewed with the patient.  The patient feels well, denies any CP/SOB, with minimal site discomfort.  He was examined by Dr. Curt Bears and considered stable for discharge to home.   Appreciate AHF team assist Continue lasix 40 mg daily -Start jardiance 10 mg daily. He has this at home.  - Start coreg 3.125 mg twice a day. -- Continue losartan 12.5 mg at bed time. Follow up with them is in place  In regards to recent finding of AFib on his monitor.  Baseline was quite difficult and noted to have significant atrial ectopy, with his 1st degree AVblock, not convinced of AFib Robert Church not resume Eliquis for now and can monitor via his device for any true AF, in he does Robert Church resume.  He has chronic posterior neck pain/headache that his PMD has been working on and w/u is in progress He was planned for an MRI outpatient and we have discussed that he Robert Church need to wait a full 6 weeks post pacer implant now for an MRI, but can have CT scan if felt helpful He Robert Church continue management of this with his PMD   Physical Exam: Vitals:   04/16/21 2118 04/17/21 0442 04/17/21 0804 04/17/21 1036  BP: 104/75 122/90  122/73  Pulse: 86 86  80  Resp: 20 16  18   Temp: 97.8 F (36.6 C) 97.9 F (36.6 C)  97.9 F (36.6 C)  TempSrc: Oral Oral  Oral  SpO2: 95% 95% 95% 98%  Weight:  99.5 kg    Height:        GEN- The patient is well appearing, alert and oriented x 3 today.   HEENT: normocephalic, atraumatic; sclera clear, conjunctiva pink; hearing intact; oropharynx clear; neck supple, no JVP Lungs- CTA b/l, normal work of breathing.  No wheezes, rales, rhonchi Heart- RRR, no murmurs, rubs or gallops, PMI not laterally displaced GI- soft,  non-tender, non-distended Extremities- no clubbing, cyanosis, or edema MS- no significant deformity or atrophy Skin- warm and dry, no rash or lesion, left chest without hematoma/ecchymosis Psych- euthymic mood, full affect Neuro- no gross deficits   Labs:   Lab Results  Component Value Date   WBC 7.3 04/16/2021   HGB 15.4 04/16/2021   HCT 46.0 04/16/2021   MCV 100.7 (H) 04/16/2021   PLT 170 04/16/2021    Recent Labs  Lab 04/15/21 2200 04/16/21 0206  NA  --  139  K  --  3.9  CL  --  104  CO2  --  25  BUN  --  15  CREATININE  --  0.82  CALCIUM  --  9.2  PROT 6.3*  --   BILITOT 0.9  --   ALKPHOS 48  --   ALT 18  --   AST 25  --   GLUCOSE  --  92    Discharge Medications:  Allergies as of 04/17/2021       Reactions   Clindamycin Other (See Comments)   Tongue swelling   Doxycycline    Severe stomach upset per patient   E-mycin [erythromycin Base] Swelling   Penicillins Swelling, Other (See Comments)   Childhood rxn--MD stated he "almost died" Has patient had a PCN reaction causing immediate rash, facial/tongue/throat swelling, SOB or lightheadedness with hypotension:Yes Has patient had a PCN reaction causing severe rash involving mucus membranes or skin necrosis:unsure Has patient had a PCN reaction that required hospitalization:unsure Has patient had a PCN reaction occurring within the last 10 years:No If all of the above answers are "NO", then may proceed with Cephalosporin use.   Rosuvastatin    Other reaction(s): Myalgias (intolerance)        Medication List     STOP taking these medications    Eliquis 5 MG Tabs tablet Generic drug: apixaban       TAKE these medications    albuterol (2.5 MG/3ML) 0.083% nebulizer solution Commonly known as: PROVENTIL Take 3 mLs (2.5 mg total) by nebulization every 6 (six) hours as needed for wheezing or shortness of breath. What changed: Another medication with the same name was changed. Make sure you understand  how and when to take each.   albuterol 108 (90 Base) MCG/ACT inhaler Commonly known as: VENTOLIN HFA INHALE 2 PUFFS INTO THE LUNGS EVERY 6 HOURS AS NEEDED FOR WHEEZING What changed: See the new instructions.   allopurinol 300 MG tablet Commonly known as: ZYLOPRIM Take 1 tablet (300 mg total) by mouth daily.   carvedilol 3.125 MG tablet Commonly known as: COREG Take 1 tablet (3.125 mg total) by mouth 2 (two) times daily with a meal.   Dulera 200-5 MCG/ACT Aero Generic drug: mometasone-formoterol Dulera 200 mcg-5 mcg/actuation HFA aerosol inhaler  INHALE 2 PUFFS BY MOUTH FIRST THING IN THE MORNING AND THEN ANOTHER 2 PUFFS ABOUT 12 HOURS LATER What changed:  how much to take how to take this when to take this additional instructions   empagliflozin 10 MG Tabs tablet Commonly known as: JARDIANCE Take 1 tablet (10 mg total) by mouth daily.   furosemide 40 MG tablet Commonly known as: LASIX Take 1 tablet (40 mg total) by mouth daily.   levofloxacin 500 MG tablet Commonly known as: Levaquin Take 1 tablet (500 mg total) by mouth daily for 10 days.   Lidocaine (Anorectal) 5 % Crea Apply topically as needed.   losartan 25 MG tablet Commonly known as: COZAAR Take 0.5 tablets (12.5 mg total) by mouth at bedtime.   metFORMIN 750 MG 24 hr tablet Commonly known as: Glucophage XR Take 1 tablet (750 mg total) by mouth daily with breakfast.   montelukast 10 MG tablet Commonly known as: Singulair Take 1 tablet (10 mg total) by mouth at bedtime.   omeprazole 20 MG capsule Commonly known as: PRILOSEC Take 1 capsule (20 mg total) by mouth 2 (two) times daily before a meal.   polyethylene glycol 17 g packet Commonly known as: MIRALAX / GLYCOLAX Take 17 g by mouth daily as needed. What changed: reasons to take this   predniSONE 10 MG tablet Commonly known as: DELTASONE Take 1 tablet (10 mg total) by mouth daily with breakfast. What changed:  when to take this additional  instructions   Spiriva Respimat 2.5 MCG/ACT Aers Generic drug: Tiotropium Bromide Monohydrate Inhale 2.5 mcg into the lungs 2 (two) times daily.   Zinc  100 MG Tabs Take 1 tablet by mouth daily.               Discharge Care Instructions  (From admission, onward)           Start     Ordered   04/17/21 0000  Discharge wound care:       Comments: As in the AVS   04/17/21 1139            Disposition: Home Discharge Instructions     Diet - low sodium heart healthy   Complete by: As directed    Discharge wound care:   Complete by: As directed    As in the AVS   Increase activity slowly   Complete by: As directed        Follow-up Information     [place] [facility] Follow up.   Specialty: Cardiology Why: 04/29/2021 @ 12:00PM (noon), wound check visit Contact information: [address], [address] [place] [place]        Constance Haw, MD Follow up.   Specialty: Cardiology Why: 07/21/2021 @ 1:45PM Contact information: [address] [place] [address] [phone]         [facility] Follow up on 04/23/2021.   Specialty: Cardiology Why: at 1100 Contact information: [address] [id number] [place] [phone]                Duration of Discharge Encounter: Greater than 30 minutes including physician time.  Venetia Night, PA-C 04/17/2021 11:39 AM   I have seen and examined this patient with Tommye Standard.  Agree with above, note added to reflect my findings.  On exam, RRR, no murmurs.  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  Device functioning appropriately.  Chest x-ray and interrogation without issue.  Plan for discharge today with follow-up in device clinic.  Robert Meno M. Yechezkel Fertig MD 04/17/2021 11:56 AM

## 2021-04-16 NOTE — ED Notes (Signed)
Report given to Glade Nurse, RN of [phone]

## 2021-04-16 NOTE — Consult Note (Addendum)
Cardiology Consultation:   Patient ID: Starlin Steib MRN: [id number]; DOB: April 15, 1956  Admit date: 04/15/2021 Date of Consult: 04/16/2021  PCP:  Janith Lima, MD   [place]  Cardiologist:  Quay Burow, MD  AHF: Dr. Haroldine Laws :[id number]}    Patient Profile:   Cavion Faiola is a 65 y.o. male with a hx of NICM historically EF 30's (no obstructive CAD by cath 2018 and CTa 2019), RBBB, COPD, OSA (prn home O2 and HS BIPAP), prior smoker, obesity, chronic CHF (systolic), HTN, HLD who is being seen today for the evaluation of CHB at the request of Dr. Radford Pax.  History of Present Illness:   Mr. Gulas was seen by cardiology team outpt a week or so ago with reports of dizzy spells and 2 syncopal events planned for monitoring.  04/10/21 telephone notes with reports from preventice reporting new onset AF, pt was mowing the grass and reported feeling a bit lightheaded at the time, in review by DOD felt to be AFlutter started on Eliquis and referred to the AFib clinic Was started recently by his PMD on levaquin for bronchitis with reports of some SOB.  Low BP apparently at his PMD visit and held his losartan and spiro it seems that day?  He saw the Afib clinic 04/14/21, historically intolerant of betablockers with marked fatigue. Reported weeks of symptoms of dizziness, syncope suspect to be his meds and had been holding torsemide, losartan and losartan Recent carotid US without significant disease. No changes were made and planned to get HF team follow up to review his meds/diuretics.   Saw the HF team yesterday given off his meds and concerns of recurrent HF.  Restarted furosemide, resume HS losartan, planned to stay of torsemide.  During this visit seems made aware of  CHB episode EP doc reviewed tracings and advised to go to the ER for further management.  LABS K+ 3.9 BUn/Creat 15/0.82 BNP 345 WBC 7.3 H/H 15/46 Plts 170 TSH 1.147  COVID/resp  panel neg CXR with no acute process  He tells me he started with dizzy spells and fainting in dec.  With the first event he struck the back of his head (and mentions posterior head/neck pain since, worsened with increased ROM of his neck) He reports that initially his fainting was without warning, suddenly found himself on the f;ow, of late the near syncope/syncope has some warning though brief of a sense of something coming up over his head.  The day ofhis CHB he reports the same sensation and weakness though quickly resolved, and timing of his symptom lined up with the tracing time.   Past Medical History:  Diagnosis Date  . Adenomatous colon polyp   . Allergy   . Anal fissure   . Asthma   . Bronchitis   . CHF (congestive heart failure) ([place])   . COPD (chronic obstructive pulmonary disease) ([place])   . Emphysema lung ([place])   . GERD (gastroesophageal reflux disease)   . Hyperlipidemia   . Hypertension   . OSA (obstructive sleep apnea)   . Pneumonia   . Sinusitis   . SOB (shortness of breath)     Past Surgical History:  Procedure Laterality Date  . LEFT HEART CATH AND CORONARY ANGIOGRAPHY N/A 04/19/2017   Procedure: Left Heart Cath and Coronary Angiography;  Surgeon: Belva Crome, MD;  Location: [facility];  Service: Cardiovascular;  Laterality: N/A;  . TONSILLECTOMY       Home  Medications:  Prior to Admission medications   Medication Sig Start Date End Date Taking? Authorizing Provider  albuterol (PROVENTIL) (2.5 MG/3ML) 0.083% nebulizer solution Take 3 mLs (2.5 mg total) by nebulization every 6 (six) hours as needed for wheezing or shortness of breath. 10/01/20   Collene Gobble, MD  albuterol (VENTOLIN HFA) 108 (90 Base) MCG/ACT inhaler INHALE 2 PUFFS INTO THE LUNGS EVERY 6 HOURS AS NEEDED FOR WHEEZING 02/27/21   Byrum, Rose Fillers, MD  allopurinol (ZYLOPRIM) 300 MG tablet Take 1 tablet (300 mg total) by mouth daily. 11/29/20   Janith Lima, MD  apixaban (ELIQUIS) 5  MG TABS tablet Take 1 tablet (5 mg total) by mouth 2 (two) times daily. 04/10/21   Lelon Perla, MD  furosemide (LASIX) 40 MG tablet Take 1 tablet (40 mg total) by mouth daily. 04/15/21   Clegg, Amy D, NP  levofloxacin (LEVAQUIN) 500 MG tablet Take 1 tablet (500 mg total) by mouth daily for 10 days. 04/09/21 04/19/21  Biagio Borg, MD  Lidocaine, Anorectal, 5 % CREA Apply topically as needed.    [provider]  losartan (COZAAR) 25 MG tablet Take 0.5 tablets (12.5 mg total) by mouth at bedtime. 04/15/21   Darrick Grinder D, NP  metFORMIN (GLUCOPHAGE XR) 750 MG 24 hr tablet Take 1 tablet (750 mg total) by mouth daily with breakfast. 12/09/20   Janith Lima, MD  mometasone-formoterol (DULERA) 200-5 MCG/ACT AERO Dulera 200 mcg-5 mcg/actuation HFA aerosol inhaler  INHALE 2 PUFFS BY MOUTH FIRST THING IN THE MORNING AND THEN ANOTHER 2 PUFFS ABOUT 12 HOURS LATER 02/11/21   Collene Gobble, MD  montelukast (SINGULAIR) 10 MG tablet Take 1 tablet (10 mg total) by mouth at bedtime. 02/06/21   Biagio Borg, MD  omeprazole (PRILOSEC) 20 MG capsule Take 1 capsule (20 mg total) by mouth 2 (two) times daily before a meal. 07/28/20   Janith Lima, MD  polyethylene glycol (MIRALAX / GLYCOLAX) 17 g packet Take 17 g by mouth daily as needed. 12/30/20   Desiree Hane, MD  predniSONE (DELTASONE) 10 MG tablet Take 1 tablet (10 mg total) by mouth daily with breakfast. 12/11/20   Collene Gobble, MD  Tiotropium Bromide Monohydrate (SPIRIVA RESPIMAT) 2.5 MCG/ACT AERS Inhale 2.5 mcg into the lungs 2 (two) times daily. 12/11/20   Collene Gobble, MD  Zinc 100 MG TABS Take 1 tablet by mouth daily.    [provider]    Inpatient Medications: Scheduled Meds: . allopurinol  300 mg Oral Daily  . furosemide  40 mg Oral Daily  . losartan  12.5 mg Oral QHS  . metFORMIN  750 mg Oral Q breakfast  . mometasone-formoterol  2 puff Inhalation BID  . montelukast  10 mg Oral QHS  . nicotine  21 mg Transdermal Once   . pantoprazole  40 mg Oral Daily  . predniSONE  10 mg Oral Q breakfast  . sodium chloride flush  3 mL Intravenous Q12H  . sodium chloride flush  3 mL Intravenous Q12H  . sodium chloride flush  3 mL Intravenous Q12H  . umeclidinium bromide  1 puff Inhalation Daily   Continuous Infusions: . sodium chloride     PRN Meds: sodium chloride, albuterol, polyethylene glycol, sodium chloride flush  Allergies:    Allergies  Allergen Reactions  . Clindamycin Other (See Comments)    Tongue swelling  . Doxycycline     Severe stomach upset per patient  .  E-Mycin [Erythromycin Base] Swelling  . Penicillins Swelling and Other (See Comments)    Childhood rxn--MD stated he "almost died" Has patient had a PCN reaction causing immediate rash, facial/tongue/throat swelling, SOB or lightheadedness with hypotension:Yes Has patient had a PCN reaction causing severe rash involving mucus membranes or skin necrosis:unsure Has patient had a PCN reaction that required hospitalization:unsure Has patient had a PCN reaction occurring within the last 10 years:No If all of the above answers are "NO", then may proceed with Cephalosporin use.    . Rosuvastatin     Other reaction(s): Myalgias (intolerance)    Social History:   Social History   Socioeconomic History  . Marital status: Single    Spouse name: Not on file  . Number of children: Not on file  . Years of education: Not on file  . Highest education level: Not on file  Occupational History  . Occupation: Dealer  Tobacco Use  . Smoking status: Former Smoker    Packs/day: 0.25    Years: 46.00    Pack years: 11.50    Types: Cigarettes  . Smokeless tobacco: Never Used  Vaping Use  . Vaping Use: Never used  Substance and Sexual Activity  . Alcohol use: Yes    Alcohol/week: 3.0 standard drinks    Types: 3 Cans of beer per week    Comment: 3 beers per day  . Drug use: No  . Sexual activity: Yes    Birth control/protection: None  Other  Topics Concern  . Not on file  Social History Narrative  . Not on file   Social Determinants of Health   Financial Resource Strain: Not on file  Food Insecurity: Not on file  Transportation Needs: Not on file  Physical Activity: Not on file  Stress: Not on file  Social Connections: Not on file  Intimate Partner Violence: Not on file    Family History:   Family History  Problem Relation Age of Onset  . Lung cancer Father   . Brain cancer Father   . Diabetes Maternal Grandmother   . Colon polyps Neg Hx   . Esophageal cancer Neg Hx   . Pancreatic cancer Neg Hx   . Stomach cancer Neg Hx      ROS:  Please see the history of present illness.  All other ROS reviewed and negative.     Physical Exam/Data:   Vitals:   04/16/21 0545 04/16/21 0600 04/16/21 0638 04/16/21 0812  BP: (!) 143/98 (!) 131/113 137/72 (!) 142/91  Pulse: 69 81  75  Resp: 19 (!) 23 20 20   Temp:   97.8 F (36.6 C) 97.7 F (36.5 C)  TempSrc:   Oral Oral  SpO2: 95% 93%  97%  Weight:   105 kg   Height:   5\' 10"  (1.778 m)    No intake or output data in the 24 hours ending 04/16/21 0903 Last 3 Weights 04/16/2021 04/16/2021 04/15/2021  Weight (lbs) 231 lb 7.7 oz 221 lb 229 lb  Weight (kg) 105 kg 100.245 kg 103.874 kg     Body mass index is 33.21 kg/m.  General:  Well nourished, well developed, in no acute distress HEENT: normal Lymph: no adenopathy Neck: no JVD Endocrine:  No thryomegaly Vascular: No carotid bruits  Cardiac: RRR; no murmurs, gallops or rubs Lungs:  CTA b/l, slight exp wheeze upper lungs b/l, moving air well, no rhonchi or rales  Abd: soft, nontender, obese  Ext: no edema Musculoskeletal:  No deformities Skin:  warm and dry  Neuro:  no gross focal abnormalities noted Psych:  Normal affect   EKG:  The EKG was personally reviewed and demonstrates:    SR/sinus arrhythmia, 74bpm, 1st degree AVblock 259ms, RBBB, LAD, PVC, PAC, blocked PAC  04/03/21 SR/sinus arrhythmia,  76, 1st degree  AVblock 258ms, RBBB, LAD, PAC 11/15/2019 SR 97bpm, 1st degree AVblock 292ms, RBBB  Telemetry:  Telemetry was personally reviewed and demonstrates:   SR 60's  Relevant CV Studies:   12/18/2020: TTE IMPRESSIONS  1. Left ventricular ejection fraction, by estimation, is 40 to 45%. The  left ventricle has mildly decreased function. The left ventricle  demonstrates global hypokinesis. The left ventricular internal cavity size  was moderately dilated. There is mild  left ventricular hypertrophy. Left ventricular diastolic parameters are  consistent with Grade II diastolic dysfunction (pseudonormalization).  Elevated left atrial pressure.  2. Right ventricular systolic function is mildly reduced. The right  ventricular size is severely enlarged. There is moderately elevated  pulmonary artery systolic pressure. The estimated right ventricular  systolic pressure is 123456 mmHg.  3. Left atrial size was mildly dilated.  4. Right atrial size was mildly dilated.  5. The mitral valve is normal in structure. No evidence of mitral valve  regurgitation.  6. The aortic valve was not well visualized. Aortic valve regurgitation  is not visualized. No aortic stenosis is present.  7. There is mild dilatation of the ascending aorta, measuring 37 mm.  8. The inferior vena cava is dilated in size with <50% respiratory  variability, suggesting right atrial pressure of 15 mmHg.    08/26/2018: coronary CT IMPRESSION: 1. Coronary calcium score of 148. This was 20 percentile for age and sex matched control.  2. Normal coronary origin with right dominance.  3. The study is affected significantly by motion. However, in the visualized portions of coronary arteries there is only mild non-obstructive plaque. Aggressive risk factor modification is recommended  4. Dilated pulmonary artery measuring 34 mm suggestive of pulmonary hypertension.   04/19/2017: LHC  Normal coronary arteries.  Global  hypokinesis, EF approximately 40%. LVEDP 18 mmHg.     Laboratory Data:  High Sensitivity Troponin:   Recent Labs  Lab 04/15/21 2053  TROPONINIHS 18*     Chemistry Recent Labs  Lab 04/09/21 1452 04/15/21 2053 04/16/21 0206  NA 135 140 139  K 4.2 3.6 3.9  CL 98 106 104  CO2 30 27 25   GLUCOSE 131* 113* 92  BUN 20 16 15   CREATININE 0.98 0.81 0.82  CALCIUM 9.3 9.0 9.2  GFRNONAA  --  >60 >60  ANIONGAP  --  7 10    Recent Labs  Lab 04/09/21 1452 04/15/21 2200  PROT 6.5 6.3*  ALBUMIN 4.0 3.8  AST 16 25  ALT 17 18  ALKPHOS 43 48  BILITOT 0.6 0.9   Hematology Recent Labs  Lab 04/09/21 1452 04/15/21 2053 04/16/21 0206  WBC 9.2 7.6 7.3  RBC 4.63 4.58 4.57  HGB 15.6 15.5 15.4  HCT 45.3 45.6 46.0  MCV 97.9 99.6 100.7*  MCH  --  33.8 33.7  MCHC 34.4 34.0 33.5  RDW 14.7 13.3 13.3  PLT 188.0 203 170   BNP Recent Labs  Lab 04/09/21 1452 04/15/21 2159  BNP  --  345.2*  PROBNP 156.0*  --     DDimer No results for input(s): DDIMER in the last 168 hours.   Radiology/Studies:  DG Chest Port 1 View  Result Date: 04/15/2021 CLINICAL DATA:  65 year old male with intermittent loss of consciousness. EXAM: PORTABLE CHEST 1 VIEW COMPARISON:  Chest radiograph dated 04/09/2021. FINDINGS: Mild cardiomegaly. No focal consolidation, pleural effusion, or pneumothorax. Atherosclerotic calcification of the aorta. No acute osseous pathology. IMPRESSION: No acute cardiopulmonary process. Electronically Signed   By: Anner Crete M.D.   On: 04/15/2021 21:51     Assessment and Plan:   1. CHB and hx of syncope  Monitor tracings reviewed with Dr. Curt Bears Agreed, CHB with V pause 13 seconds with a couple escape beats No nodal blocking agents or reversible causes Progressive baseline conduction system disease noted Recommend PPM implant Dr. Curt Bears has seen and examined the patient, discussed PPM recommendation and rational, the procedure and potential risks/benefits the  patient is agreeable. Amelita Risinger plan for later today  2. New reports of AFib/flutter     I have reviewed tracings, baseline artifact makes difficult, it is irregular though not entirely convince is AF     He has long 1st degree AVblock and PACs      I Euclid Cassetta review further with Dr. Curt Bears     Not certain needs [place], and can monitor clearly via device going forward  3. NICM 4. Chronic CHF     He has been off his diuretics of late 2/2 dizzy spells and syncope     Lasix as ordered yesterday by HF team     CXR clear, no edema, does not appear overtly volume OL by my exam     ReDs clip 51 yesterday seems about his baseline     I have made HF team aware, they Miriam Liles see him  5. COPD     Recently on levaquin for increased SOB/cough     ? voume     Follow with lasix and resumption of diuretic     At baseline on home PRN O2 qnd HS BIPAP     Chronic prednisone continued  6. HTN     Follow  7. Posterior neck pain     Since December after a fall/syncope     Worse with movement of his neck     No peripheral symptoms     w/u underway with his PMD, had been planned fro MRI next week, discussed this Advith Martine need to wait at least 6 week post pacer, though could have a CT scan.  He Caleyah Jr f/u with his PMD     Risk Assessment/Risk Scores:  {  For questions or updates, please contact [place] Please consult [email or web address] for contact info under    Signed, Baldwin Jamaica, PA-C  04/16/2021 9:03 AM;t  I have seen and examined this patient with Tommye Standard.  Agree with above, note added to reflect my findings.  On exam, RRR, no murmurs. Patient presented with episodes of syncope, found to have heart block on monitor. No reversible causes at this time. Lazette Estala plan for pacemaker implant. Patient has CHF with an EF mildly reduced and RBBB. As he is likely to have a low amount of V pacing, Reiss Mowrey plan for dual chamber pacemaker.    Kelly Eisler M. Jarica Plass MD 04/16/2021 10:46 AM

## 2021-04-16 NOTE — Interval H&P Note (Signed)
History and Physical Interval Note:  04/16/2021 10:48 AM  Robert Church  has presented today for surgery, with the diagnosis of heart block.  The various methods of treatment have been discussed with the patient and family. After consideration of risks, benefits and other options for treatment, the patient has consented to  Procedure(s): PACEMAKER IMPLANT (N/A) as a surgical intervention.  The patient's history has been reviewed, patient examined, no change in status, stable for surgery.  I have reviewed the patient's chart and labs.  Questions were answered to the patient's satisfaction.     Robert Church [facility]  Robert Church has presented today for surgery, with the diagnosis of intermittent CHB.  The various methods of treatment have been discussed with the patient and family. After consideration of risks, benefits and other options for treatment, the patient has consented to  Procedure(s): Catheter ablation as a surgical intervention .  Risks include but not limited to complete heart block, stroke, esophageal damage, nerve damage, bleeding, vascular damage, tamponade, perforation, MI, and death. The patient's history has been reviewed, patient examined, no change in status, stable for surgery.  I have reviewed the patient's chart and labs.  Questions were answered to the patient's satisfaction.    Robert Church Curt Bears, MD 04/16/2021 10:48 AM

## 2021-04-16 NOTE — Progress Notes (Incomplete)
  Echocardiogram 2D Echocardiogram has been performed.  Robert Church  [name] 04/16/2021, 3:30 PM

## 2021-04-16 NOTE — Telephone Encounter (Signed)
error    This encounter was created in error - please disregard.

## 2021-04-16 NOTE — TOC Initial Note (Signed)
Transition of Care (TOC) - Initial/Assessment Note  Heart Failure  Patient Details  Name: Robert Church MRN: [id number] Date of Birth: October 29, 1956  Transition of Care [facility]) CM/SW Contact:    [place], [place] Phone Number: 04/16/2021, 3:43 PM  Clinical Narrative:                 CSW spoke with the patient at bedside and completed very brief SDOH screening with the patient who denied having any needs at this time. Mr. Hashimi reported that he has lots of bills but that he isn't facing eviction and isn't behind on anything just that once he leaves the hospital he may need some assistance. Patient was in the middle of getting ready for a peri bath by the nurse. Therefore, CSW provided the patient with social workers name, number and position and to reach out to [place] as other social needs arise.    Barriers to Discharge: Continued Medical Work up   Patient Goals and CMS Choice        Expected Discharge Plan and Services   In-house Referral: Clinical Social Work                                            Prior Living Arrangements/Services     Patient language and need for interpreter reviewed:: Yes        Need for Family Participation in Patient Care: No (Comment) Care giver support system in place?: No (comment)   Criminal Activity/Legal Involvement Pertinent to Current Situation/Hospitalization: No - Comment as needed  Activities of Daily Living Home Assistive Devices/Equipment: None ADL Screening (condition at time of admission) Patient's cognitive ability adequate to safely complete daily activities?: Yes Is the patient deaf or have difficulty hearing?: No Does the patient have difficulty seeing, even when wearing glasses/contacts?: No Does the patient have difficulty concentrating, remembering, or making decisions?: No Patient able to express need for assistance with ADLs?: Yes Does the patient have difficulty dressing or bathing?: No Independently  performs ADLs?: Yes (appropriate for developmental age) Does the patient have difficulty walking or climbing stairs?: No Weakness of Legs: None Weakness of Arms/Hands: None  Permission Sought/Granted                  Emotional Assessment Appearance:: Appears stated age Attitude/Demeanor/Rapport: Engaged Affect (typically observed): Pleasant Orientation: : Oriented to Self,Oriented to  Time,Oriented to Place,Oriented to Situation Alcohol / Substance Use: Not Applicable Psych Involvement: No (comment)  Admission diagnosis:  AV block, 3rd degree ([place]) [I44.2] Near syncope [R55] Patient Active Problem List   Diagnosis Date Noted  . AV block, 3rd degree ([place]) 04/16/2021  . Postural dizziness with presyncope 04/09/2021  . Carotid artery calcification 04/09/2021  . Hypotension 04/09/2021  . Panlobular emphysema ([place]) 03/25/2021  . Atherosclerosis of aorta ([place]) 03/25/2021  . Body mass index (BMI) 32.0-32.9, adult 01/15/2021  . Lumbar spondylosis 01/10/2021  . Foraminal stenosis of cervical region 01/10/2021  . Chronic bilateral low back pain without sciatica 01/09/2021  . Type II diabetes mellitus with manifestations ([place]) 11/29/2020  . Thrombocytopenia ([place]) 11/29/2020  . Edema of lower extremity due to peripheral venous insufficiency 10/29/2020  . Primary osteoarthritis of both knees 09/24/2020  . Gynecomastia, male 08/08/2020  . Idiopathic chronic gout of foot without tophus 08/08/2020  . Yellow jacket sting allergy 08/08/2020  . Class 2 severe obesity due  to excess calories with serious comorbidity and body mass index (BMI) of 38.0 to 38.9 in adult [facility]) 08/05/2020  . Hyperlipidemia LDL goal <70 07/11/2020  . Hypogonadism male 07/08/2020  . Hypertriglyceridemia 07/08/2020  . Routine general medical examination at a health care facility 07/08/2020  . Umbilical hernia without obstruction and without gangrene 07/08/2020  . Chronic combined systolic and diastolic CHF  (congestive heart failure) ([place]) 01/09/2020  . Solitary pulmonary nodule 08/15/2019  . 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 NICM (nonischemic cardiomyopathy) ([place]) 12/16/2018  . History of adenomatous polyp of colon 11/25/2018  . Morbid obesity ([place]) 11/24/2015  . Cigarette smoker 01/16/2015  . COPD exacerbation ([place]) 06/15/2014  . COPD, group D, by GOLD 2017 classification ([place]) 03/03/2013  . Obstructive sleep apnea syndrome 01/05/2013  . Asbestos exposure 01/05/2013  . Seasonal allergic rhinitis 10/06/2012  . GERD (gastroesophageal reflux disease) 06/05/2012  . Insomnia 06/05/2012  . 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 HTN (hypertension) 07/23/2011   PCP:  Janith Lima, MD Pharmacy:   [facility] [place], [place] - [address] [place] [address] Phone: [phone] Fax: [phone]  [facility] [place], [place] [address] [place] [address] Phone: [phone] Fax: [phone]     Social Determinants of Health (SDOH) Interventions Food Insecurity Interventions: Intervention Not Indicated Financial Strain Interventions: Intervention Not Indicated Housing Interventions: Intervention Not Indicated Transportation Interventions: Intervention Not Indicated  Readmission Risk Interventions No flowsheet data found.  Sabir Charters, MSW, [place] Heart Failure Social Worker

## 2021-04-16 NOTE — Consult Note (Addendum)
Advanced Heart Failure Team Consult Note   Primary Physician: Janith Lima, MD PCP-Cardiologist:  Quay Burow, MD  Reason for Consultation: Heart Failure   HPI:    Robert Church is seen today for evaluation of heart failure  at the request of Dr Curt Bears.    Mr. Kuhnle is a 65 y/o male with h/o obesity, HTN, COPD, OSA, with ongoing tobacco use and chronic systolic HF. He underwent cath in 4/18 due to CP. Cath showed normal coronaries with EF 40% and global HK. LVEDP 19.  He saw Dr. Gwenlyn Found on 07/01/2018 because of recurrent chest pain. Myoview stress test performed 07/21/2018 showed EF 38% inferior scar with septal ischemia. A 2D echo performed 07/07/2018 revealed an EF of 30 to 35%. He was referred for cardiac CT. Cardiac CT on 08/26/18.  Calcium score 148 (74th percentile)  LAD 25% otherwise normal cors. Dilated pulmonary arteries suggestive of PH.   Echo 12/21 EF improved to 45%. Mild RV dysfunction   Admitted 1/22 for acute on chronic hypoxic respiratory failure secondary to acute COPDE and a/c CHF w/ volume overload. He had massive diuresis w/ IV Lasix, diuresed down from admit wt of 265 lb to 210 lb. Echo was repeated 12/21, EF 40-45% (unchanged). RV mildly reduced, right ventricular systolic pressure was 00.8 mmHg.  Saw Dr Haroldine Laws 03/04/21 and was volume overloaded. Arlyce Harman increased and jardiance was added. Of note spiro 25 mg was not tolerated due to dizziness so he cut back to 12.5 mg daily.   He returned to the HF clinic 03/31/21 . At that time he was stable was set up for 3 month follow up.   He had follow up 04/03/21 with general cardiology and reported dizziness associated with syncope x2. Cardiac Monitor placed.  On 04/11/21 had Carotids checked and this showed near-normal plaque.   Had episode of A fib noted monitor 4/21 and was started on eliquis. Referred to A fib clinic.   ON 4/25 he was seen in the A fib clinic for new onset of A fib. He is not on BB as  he did not tolerate bisoprolol which wastried inthe past. Has had pre syncope/syncope on several occasions over the last few weeks.  He was very concerned aboutvolume overload so he was asked to follow up in the HF clinic 4/26.  Plan was to continue 30 day [employer] Live monitor.  Yesterday he was seen in the HF clinic and described presyncopal episode that morning. Says he bent over then had episode of neck pain going to the back of his head which was followed by being lightheaded. Says it lasted about 5 seconds. He checked his 02 sats/pulse -->93% and pulse was 39. Discussed with A fib clinic, Doristine Devoid NP. During the visit it was reported he had 3rd degree heart block earlier that day. A fib Clinic then arranged urgent referral to EP. Reds Clip 38% so 40 mg lasix started. Also started on lasix 40 mg daily.   Admitted last night with 3rd degree heart block. EP planning for pacemaker today. . Complaining of headache. CXR - no acute process. BNP 345, Sar2 negative, creatinine 0.8.    Review of Systems: [y] = yes, [ ]  = no   . General: Weight gain [ ] ; Weight loss [ ] ; Anorexia [ ] ; Fatigue [ Y]; Fever [ ] ; Chills [ ] ; Weakness [ ]   . Cardiac: Chest pain/pressure [ ] ; Resting SOB [ ] ; Exertional SOB [Y ]; Orthopnea [ ] ;  Pedal Edema [ ] ; Palpitations [ ] ; Syncope [ ] ; Presyncope [ ] ; Paroxysmal nocturnal dyspnea[ ]   . Pulmonary: Cough [ ] ; Wheezing[ ] ; Hemoptysis[ ] ; Sputum [ ] ; Snoring [ ]   . GI: Vomiting[ ] ; Dysphagia[ ] ; Melena[ ] ; Hematochezia [ ] ; Heartburn[ ] ; Abdominal pain [ ] ; Constipation [ ] ; Diarrhea [ ] ; BRBPR [ ]   . GU: Hematuria[ ] ; Dysuria [ ] ; Nocturia[ ]   . Vascular: Pain in legs with walking [ ] ; Pain in feet with lying flat [ ] ; Non-healing sores [ ] ; Stroke [ ] ; TIA [ ] ; Slurred speech [ ] ;  . Neuro: Headaches[ ] ; Vertigo[ ] ; Seizures[ ] ; Paresthesias[ ] ;Blurred vision [ ] ; Diplopia [ ] ; Vision changes [ ]   . Ortho/Skin: Arthritis [ ] ; Joint pain [Y ]; Muscle pain [ ] ; Joint  swelling [ ] ; Back Pain [ Y]; Rash [ ]   . Psych: Depression[Y ]; Anxiety[ ]   . Heme: Bleeding problems [ ] ; Clotting disorders [ ] ; Anemia [ ]   . Endocrine: Diabetes [ ] ; Thyroid dysfunction[ ]   Home Medications Prior to Admission medications   Medication Sig Start Date End Date Taking? Authorizing Provider  albuterol (PROVENTIL) (2.5 MG/3ML) 0.083% nebulizer solution Take 3 mLs (2.5 mg total) by nebulization every 6 (six) hours as needed for wheezing or shortness of breath. 10/01/20   Collene Gobble, MD  albuterol (VENTOLIN HFA) 108 (90 Base) MCG/ACT inhaler INHALE 2 PUFFS INTO THE LUNGS EVERY 6 HOURS AS NEEDED FOR WHEEZING 02/27/21   Byrum, Rose Fillers, MD  allopurinol (ZYLOPRIM) 300 MG tablet Take 1 tablet (300 mg total) by mouth daily. 11/29/20   Janith Lima, MD  apixaban (ELIQUIS) 5 MG TABS tablet Take 1 tablet (5 mg total) by mouth 2 (two) times daily. 04/10/21   Lelon Perla, MD  furosemide (LASIX) 40 MG tablet Take 1 tablet (40 mg total) by mouth daily. 04/15/21   Clegg, Amy D, NP  levofloxacin (LEVAQUIN) 500 MG tablet Take 1 tablet (500 mg total) by mouth daily for 10 days. 04/09/21 04/19/21  Biagio Borg, MD  Lidocaine, Anorectal, 5 % CREA Apply topically as needed.    [provider]  losartan (COZAAR) 25 MG tablet Take 0.5 tablets (12.5 mg total) by mouth at bedtime. 04/15/21   Darrick Grinder D, NP  metFORMIN (GLUCOPHAGE XR) 750 MG 24 hr tablet Take 1 tablet (750 mg total) by mouth daily with breakfast. 12/09/20   Janith Lima, MD  mometasone-formoterol (DULERA) 200-5 MCG/ACT AERO Dulera 200 mcg-5 mcg/actuation HFA aerosol inhaler  INHALE 2 PUFFS BY MOUTH FIRST THING IN THE MORNING AND THEN ANOTHER 2 PUFFS ABOUT 12 HOURS LATER 02/11/21   Collene Gobble, MD  montelukast (SINGULAIR) 10 MG tablet Take 1 tablet (10 mg total) by mouth at bedtime. 02/06/21   Biagio Borg, MD  omeprazole (PRILOSEC) 20 MG capsule Take 1 capsule (20 mg total) by mouth 2 (two) times daily before a  meal. 07/28/20   Janith Lima, MD  polyethylene glycol (MIRALAX / GLYCOLAX) 17 g packet Take 17 g by mouth daily as needed. 12/30/20   Desiree Hane, MD  predniSONE (DELTASONE) 10 MG tablet Take 1 tablet (10 mg total) by mouth daily with breakfast. 12/11/20   Collene Gobble, MD  Tiotropium Bromide Monohydrate (SPIRIVA RESPIMAT) 2.5 MCG/ACT AERS Inhale 2.5 mcg into the lungs 2 (two) times daily. 12/11/20   Collene Gobble, MD  Zinc 100 MG TABS Take 1 tablet by mouth daily.  [provider]    Past Medical History: Past Medical History:  Diagnosis Date  . Adenomatous colon polyp   . Allergy   . Anal fissure   . Asthma   . Bronchitis   . CHF (congestive heart failure) ([place])   . COPD (chronic obstructive pulmonary disease) ([place])   . Emphysema lung ([place])   . GERD (gastroesophageal reflux disease)   . Hyperlipidemia   . Hypertension   . OSA (obstructive sleep apnea)   . Pneumonia   . Sinusitis   . SOB (shortness of breath)     Past Surgical History: Past Surgical History:  Procedure Laterality Date  . LEFT HEART CATH AND CORONARY ANGIOGRAPHY N/A 04/19/2017   Procedure: Left Heart Cath and Coronary Angiography;  Surgeon: Belva Crome, MD;  Location: [place] [facility];  Service: Cardiovascular;  Laterality: N/A;  . TONSILLECTOMY      Family History: Family History  Problem Relation Age of Onset  . Lung cancer Father   . Brain cancer Father   . Diabetes Maternal Grandmother   . Colon polyps Neg Hx   . Esophageal cancer Neg Hx   . Pancreatic cancer Neg Hx   . Stomach cancer Neg Hx     Social History: Social History   Socioeconomic History  . Marital status: Single    Spouse name: Not on file  . Number of children: Not on file  . Years of education: Not on file  . Highest education level: Not on file  Occupational History  . Occupation: Dealer  Tobacco Use  . Smoking status: Former Smoker    Packs/day: 0.25    Years: 46.00    Pack years: 11.50     Types: Cigarettes  . Smokeless tobacco: Never Used  Vaping Use  . Vaping Use: Never used  Substance and Sexual Activity  . Alcohol use: Yes    Alcohol/week: 3.0 standard drinks    Types: 3 Cans of beer per week    Comment: 3 beers per day  . Drug use: No  . Sexual activity: Yes    Birth control/protection: None  Other Topics Concern  . Not on file  Social History Narrative  . Not on file   Social Determinants of Health   Financial Resource Strain: Not on file  Food Insecurity: Not on file  Transportation Needs: Not on file  Physical Activity: Not on file  Stress: Not on file  Social Connections: Not on file    Allergies:  Allergies  Allergen Reactions  . Clindamycin Other (See Comments)    Tongue swelling  . Doxycycline     Severe stomach upset per patient  . E-Mycin [Erythromycin Base] Swelling  . Penicillins Swelling and Other (See Comments)    Childhood rxn--MD stated he "almost died" Has patient had a PCN reaction causing immediate rash, facial/tongue/throat swelling, SOB or lightheadedness with hypotension:Yes Has patient had a PCN reaction causing severe rash involving mucus membranes or skin necrosis:unsure Has patient had a PCN reaction that required hospitalization:unsure Has patient had a PCN reaction occurring within the last 10 years:No If all of the above answers are "NO", then may proceed with Cephalosporin use.    . Rosuvastatin     Other reaction(s): Myalgias (intolerance)    Objective:    Vital Signs:   Temp:  [97.7 F (36.5 C)-98.2 F (36.8 C)] 97.7 F (36.5 C) (04/27 0812) Pulse Rate:  [26-87] 75 (04/27 0812) Resp:  [13-27] 20 (04/27 0812) BP: (113-149)/(65-124)  142/91 (04/27 0812) SpO2:  [89 %-97 %] 97 % (04/27 0812) Weight:  [100.2 kg-105 kg] 105 kg (04/27 0638) Last BM Date: 04/15/21  Weight change: Filed Weights   04/16/21 0247 04/16/21 [id number]  Weight: 100.2 kg 105 kg    Intake/Output:  No intake or output data in the 24 hours  ending 04/16/21 0921    Physical Exam    General:  No resp difficulty HEENT: normal Neck: supple. JVP difficult to assess . Carotids 2+ bilat; no bruits. No lymphadenopathy or thyromegaly appreciated. Cor: PMI nondisplaced. Irregular rate & rhythm. No rubs, gallops or murmurs. Lungs: clear Abdomen: soft, nontender, distended. No hepatosplenomegaly. No bruits or masses. Good bowel sounds. Extremities: no cyanosis, clubbing, rash, edema Neuro: alert & orientedx3, cranial nerves grossly intact. moves all 4 extremities w/o difficulty. Affect pleasant   Telemetry   SR PACS RBBB  EKG    SR 1st degree HB PACs RBBB  Labs   Basic Metabolic Panel: Recent Labs  Lab 04/09/21 1452 04/15/21 2053 04/16/21 0206  NA 135 140 139  K 4.2 3.6 3.9  CL 98 106 104  CO2 30 27 25   GLUCOSE 131* 113* 92  BUN 20 16 15   CREATININE 0.98 0.81 0.82  CALCIUM 9.3 9.0 9.2    Liver Function Tests: Recent Labs  Lab 04/09/21 1452 04/15/21 2200  AST 16 25  ALT 17 18  ALKPHOS 43 48  BILITOT 0.6 0.9  PROT 6.5 6.3*  ALBUMIN 4.0 3.8   No results for input(s): LIPASE, AMYLASE in the last 168 hours. No results for input(s): AMMONIA in the last 168 hours.  CBC: Recent Labs  Lab 04/09/21 1452 04/15/21 2053 04/16/21 0206  WBC 9.2 7.6 7.3  NEUTROABS 7.9*  --   --   HGB 15.6 15.5 15.4  HCT 45.3 45.6 46.0  MCV 97.9 99.6 100.7*  PLT 188.0 203 170    Cardiac Enzymes: No results for input(s): CKTOTAL, CKMB, CKMBINDEX, TROPONINI in the last 168 hours.  BNP: BNP (last 3 results) Recent Labs    12/17/20 1134 04/15/21 2159  BNP 1,374.9* 345.2*    ProBNP (last 3 results) Recent Labs    04/09/21 1452  PROBNP 156.0*     CBG: No results for input(s): GLUCAP in the last 168 hours.  Coagulation Studies: Recent Labs    04/15/21 2200  LABPROT 12.2  INR 0.9     Imaging   DG Chest Port 1 View  Result Date: 04/15/2021 CLINICAL DATA:  65 year old male with intermittent loss of  consciousness. EXAM: PORTABLE CHEST 1 VIEW COMPARISON:  Chest radiograph dated 04/09/2021. FINDINGS: Mild cardiomegaly. No focal consolidation, pleural effusion, or pneumothorax. Atherosclerotic calcification of the aorta. No acute osseous pathology. IMPRESSION: No acute cardiopulmonary process. Electronically Signed   By: Anner Crete M.D.   On: 04/15/2021 21:51      Medications:     Current Medications: . allopurinol  300 mg Oral Daily  . furosemide  40 mg Oral Daily  . losartan  12.5 mg Oral QHS  . metFORMIN  750 mg Oral Q breakfast  . mometasone-formoterol  2 puff Inhalation BID  . montelukast  10 mg Oral QHS  . nicotine  21 mg Transdermal Once  . pantoprazole  40 mg Oral Daily  . predniSONE  10 mg Oral Q breakfast  . sodium chloride flush  3 mL Intravenous Q12H  . sodium chloride flush  3 mL Intravenous Q12H  . sodium chloride flush  3 mL Intravenous Q12H  .  umeclidinium bromide  1 puff Inhalation Daily     Infusions: . sodium chloride          Assessment/Plan   1. CHB Had syncope on several occasions. Outpatient monitor showed episode of CHB.  EP plan for [facility] today.   2. Chronic Systolic HF, NICM   cath 0000000 normal cors. EF 48%  - Myoview 07/21/2018 showed EF 38% inferior scar with septal ischemia.  - Echo 07/07/2018 revealed EF 30-35%. RV normal.  - Cardiac CT 08/26/18.  Calcium score 148 (74th percentile)  LAD 25% otherwise normal cors. Dilated pulmonary arteries suggestive of PH.  - ECHO 11/2020 EF 40-45%.  - Suspect NICM due to HTN and inadequately treated OSA - In tolerant spiro, torsemide and jardiance, bisoprolol  - Volume status stable. Continue po lasix. .  -Maybe able to re challenge after pace maker.  - For now continue losartan and lasix.   3. PAF New A fib reported on outpatient monitor.  - Eliquis on hold.   4. OSA Uses Bipap  5. COPD Continue oxgen as needed.   6. HTN  Continue current dose of losartan and adjust after  procedure.   Length of Stay: 0  Darrick Grinder, NP  04/16/2021, 9:21 AM  Advanced Heart Failure Team Pager [phone] (M-F; 7a - 5p)  Please contact [facility] for night-coverage after hours (4p -7a ) and weekends on [email or web address]  Patient seen with NP, agree with the above note.   Patient had an event monitor placed for presyncope/dizziness and had further syncope prior to admission.  Found to have episode CHB.  Baseline NSR with RBBB.  Medications were stopped by PCP due to dizziness (initially concern that it was med-related).  No chest pain, dyspnea at baseline. He does not take nodal blockers.  Currently in NSR.   Last echo in 12/21 with EF 40-45%.   General: NAD Neck: Thick, JVP 8 cm, no thyromegaly or thyroid nodule.  Lungs: Clear to auscultation bilaterally with normal respiratory effort. CV: Nondisplaced PMI.  Heart regular S1/S2, no S3/S4, no murmur.  No peripheral edema.  No carotid bruit.  Normal pedal pulses.  Abdomen: Soft, nontender, no hepatosplenomegaly, no distention.  Skin: Intact without lesions or rashes.  Neurologic: Alert and oriented x 3.  Psych: Normal affect. Extremities: No clubbing or cyanosis.  HEENT: Normal.   Volume status looks ok, has had difficulty tolerating cardiac meds.  - Start Lasix 40 mg daily and losartan 12.5 daily.  - Repeat echo for reassessment of LV function   Paroxysmal atrial fibrillation, Eliquis held for pacemaker.   EP plans dual chamber PPM for CHB.  EF is only mildly decreased on last echo with baseline RBBB and suspect he will not pace frequently, therefore CRT not recommended.  As above, will get echo.   Loralie Champagne 04/16/2021 12:32 PM

## 2021-04-16 NOTE — H&P (View-Only) (Signed)
Cardiology Consultation:   Patient ID: Starlin Steib MRN: [id number]; DOB: April 15, 1956  Admit date: 04/15/2021 Date of Consult: 04/16/2021  PCP:  Janith Lima, MD   [facility]  Cardiologist:  Quay Burow, MD  AHF: Dr. Haroldine Laws :[id number]}    Patient Profile:   Cavion Faiola is a 65 y.o. male with a hx of NICM historically EF 30's (no obstructive CAD by cath 2018 and CTa 2019), RBBB, COPD, OSA (prn home O2 and HS BIPAP), prior smoker, obesity, chronic CHF (systolic), HTN, HLD who is being seen today for the evaluation of CHB at the request of Dr. Radford Pax.  History of Present Illness:   Mr. Gulas was seen by cardiology team outpt a week or so ago with reports of dizzy spells and 2 syncopal events planned for monitoring.  04/10/21 telephone notes with reports from preventice reporting new onset AF, pt was mowing the grass and reported feeling a bit lightheaded at the time, in review by DOD felt to be AFlutter started on Eliquis and referred to the AFib clinic Was started recently by his PMD on levaquin for bronchitis with reports of some SOB.  Low BP apparently at his PMD visit and held his losartan and spiro it seems that day?  He saw the Afib clinic 04/14/21, historically intolerant of betablockers with marked fatigue. Reported weeks of symptoms of dizziness, syncope suspect to be his meds and had been holding torsemide, losartan and losartan Recent carotid US without significant disease. No changes were made and planned to get HF team follow up to review his meds/diuretics.   Saw the HF team yesterday given off his meds and concerns of recurrent HF.  Restarted furosemide, resume HS losartan, planned to stay of torsemide.  During this visit seems made aware of  CHB episode EP doc reviewed tracings and advised to go to the ER for further management.  LABS K+ 3.9 BUn/Creat 15/0.82 BNP 345 WBC 7.3 H/H 15/46 Plts 170 TSH 1.147  COVID/resp  panel neg CXR with no acute process  He tells me he started with dizzy spells and fainting in dec.  With the first event he struck the back of his head (and mentions posterior head/neck pain since, worsened with increased ROM of his neck) He reports that initially his fainting was without warning, suddenly found himself on the f;ow, of late the near syncope/syncope has some warning though brief of a sense of something coming up over his head.  The day ofhis CHB he reports the same sensation and weakness though quickly resolved, and timing of his symptom lined up with the tracing time.   Past Medical History:  Diagnosis Date  . Adenomatous colon polyp   . Allergy   . Anal fissure   . Asthma   . Bronchitis   . CHF (congestive heart failure) ([place])   . COPD (chronic obstructive pulmonary disease) ([place])   . Emphysema lung ([place])   . GERD (gastroesophageal reflux disease)   . Hyperlipidemia   . Hypertension   . OSA (obstructive sleep apnea)   . Pneumonia   . Sinusitis   . SOB (shortness of breath)     Past Surgical History:  Procedure Laterality Date  . LEFT HEART CATH AND CORONARY ANGIOGRAPHY N/A 04/19/2017   Procedure: Left Heart Cath and Coronary Angiography;  Surgeon: Belva Crome, MD;  Location: [facility];  Service: Cardiovascular;  Laterality: N/A;  . TONSILLECTOMY       Home  Medications:  Prior to Admission medications   Medication Sig Start Date End Date Taking? Authorizing Provider  albuterol (PROVENTIL) (2.5 MG/3ML) 0.083% nebulizer solution Take 3 mLs (2.5 mg total) by nebulization every 6 (six) hours as needed for wheezing or shortness of breath. 10/01/20   Collene Gobble, MD  albuterol (VENTOLIN HFA) 108 (90 Base) MCG/ACT inhaler INHALE 2 PUFFS INTO THE LUNGS EVERY 6 HOURS AS NEEDED FOR WHEEZING 02/27/21   Byrum, Rose Fillers, MD  allopurinol (ZYLOPRIM) 300 MG tablet Take 1 tablet (300 mg total) by mouth daily. 11/29/20   Janith Lima, MD  apixaban (ELIQUIS) 5  MG TABS tablet Take 1 tablet (5 mg total) by mouth 2 (two) times daily. 04/10/21   Lelon Perla, MD  furosemide (LASIX) 40 MG tablet Take 1 tablet (40 mg total) by mouth daily. 04/15/21   Clegg, Amy D, NP  levofloxacin (LEVAQUIN) 500 MG tablet Take 1 tablet (500 mg total) by mouth daily for 10 days. 04/09/21 04/19/21  Biagio Borg, MD  Lidocaine, Anorectal, 5 % CREA Apply topically as needed.    [provider]  losartan (COZAAR) 25 MG tablet Take 0.5 tablets (12.5 mg total) by mouth at bedtime. 04/15/21   Darrick Grinder D, NP  metFORMIN (GLUCOPHAGE XR) 750 MG 24 hr tablet Take 1 tablet (750 mg total) by mouth daily with breakfast. 12/09/20   Janith Lima, MD  mometasone-formoterol (DULERA) 200-5 MCG/ACT AERO Dulera 200 mcg-5 mcg/actuation HFA aerosol inhaler  INHALE 2 PUFFS BY MOUTH FIRST THING IN THE MORNING AND THEN ANOTHER 2 PUFFS ABOUT 12 HOURS LATER 02/11/21   Collene Gobble, MD  montelukast (SINGULAIR) 10 MG tablet Take 1 tablet (10 mg total) by mouth at bedtime. 02/06/21   Biagio Borg, MD  omeprazole (PRILOSEC) 20 MG capsule Take 1 capsule (20 mg total) by mouth 2 (two) times daily before a meal. 07/28/20   Janith Lima, MD  polyethylene glycol (MIRALAX / GLYCOLAX) 17 g packet Take 17 g by mouth daily as needed. 12/30/20   Desiree Hane, MD  predniSONE (DELTASONE) 10 MG tablet Take 1 tablet (10 mg total) by mouth daily with breakfast. 12/11/20   Collene Gobble, MD  Tiotropium Bromide Monohydrate (SPIRIVA RESPIMAT) 2.5 MCG/ACT AERS Inhale 2.5 mcg into the lungs 2 (two) times daily. 12/11/20   Collene Gobble, MD  Zinc 100 MG TABS Take 1 tablet by mouth daily.    [provider]    Inpatient Medications: Scheduled Meds: . allopurinol  300 mg Oral Daily  . furosemide  40 mg Oral Daily  . losartan  12.5 mg Oral QHS  . metFORMIN  750 mg Oral Q breakfast  . mometasone-formoterol  2 puff Inhalation BID  . montelukast  10 mg Oral QHS  . nicotine  21 mg Transdermal Once   . pantoprazole  40 mg Oral Daily  . predniSONE  10 mg Oral Q breakfast  . sodium chloride flush  3 mL Intravenous Q12H  . sodium chloride flush  3 mL Intravenous Q12H  . sodium chloride flush  3 mL Intravenous Q12H  . umeclidinium bromide  1 puff Inhalation Daily   Continuous Infusions: . sodium chloride     PRN Meds: sodium chloride, albuterol, polyethylene glycol, sodium chloride flush  Allergies:    Allergies  Allergen Reactions  . Clindamycin Other (See Comments)    Tongue swelling  . Doxycycline     Severe stomach upset per patient  .  E-Mycin [Erythromycin Base] Swelling  . Penicillins Swelling and Other (See Comments)    Childhood rxn--MD stated he "almost died" Has patient had a PCN reaction causing immediate rash, facial/tongue/throat swelling, SOB or lightheadedness with hypotension:Yes Has patient had a PCN reaction causing severe rash involving mucus membranes or skin necrosis:unsure Has patient had a PCN reaction that required hospitalization:unsure Has patient had a PCN reaction occurring within the last 10 years:No If all of the above answers are "NO", then may proceed with Cephalosporin use.    . Rosuvastatin     Other reaction(s): Myalgias (intolerance)    Social History:   Social History   Socioeconomic History  . Marital status: Single    Spouse name: Not on file  . Number of children: Not on file  . Years of education: Not on file  . Highest education level: Not on file  Occupational History  . Occupation: Dealer  Tobacco Use  . Smoking status: Former Smoker    Packs/day: 0.25    Years: 46.00    Pack years: 11.50    Types: Cigarettes  . Smokeless tobacco: Never Used  Vaping Use  . Vaping Use: Never used  Substance and Sexual Activity  . Alcohol use: Yes    Alcohol/week: 3.0 standard drinks    Types: 3 Cans of beer per week    Comment: 3 beers per day  . Drug use: No  . Sexual activity: Yes    Birth control/protection: None  Other  Topics Concern  . Not on file  Social History Narrative  . Not on file   Social Determinants of Health   Financial Resource Strain: Not on file  Food Insecurity: Not on file  Transportation Needs: Not on file  Physical Activity: Not on file  Stress: Not on file  Social Connections: Not on file  Intimate Partner Violence: Not on file    Family History:   Family History  Problem Relation Age of Onset  . Lung cancer Father   . Brain cancer Father   . Diabetes Maternal Grandmother   . Colon polyps Neg Hx   . Esophageal cancer Neg Hx   . Pancreatic cancer Neg Hx   . Stomach cancer Neg Hx      ROS:  Please see the history of present illness.  All other ROS reviewed and negative.     Physical Exam/Data:   Vitals:   04/16/21 0545 04/16/21 0600 04/16/21 0638 04/16/21 0812  BP: (!) 143/98 (!) 131/113 137/72 (!) 142/91  Pulse: 69 81  75  Resp: 19 (!) 23 20 20   Temp:   97.8 F (36.6 C) 97.7 F (36.5 C)  TempSrc:   Oral Oral  SpO2: 95% 93%  97%  Weight:   105 kg   Height:   5\' 10"  (1.778 m)    No intake or output data in the 24 hours ending 04/16/21 0903 Last 3 Weights 04/16/2021 04/16/2021 04/15/2021  Weight (lbs) 231 lb 7.7 oz 221 lb 229 lb  Weight (kg) 105 kg 100.245 kg 103.874 kg     Body mass index is 33.21 kg/m.  General:  Well nourished, well developed, in no acute distress HEENT: normal Lymph: no adenopathy Neck: no JVD Endocrine:  No thryomegaly Vascular: No carotid bruits  Cardiac: RRR; no murmurs, gallops or rubs Lungs:  CTA b/l, slight exp wheeze upper lungs b/l, moving air well, no rhonchi or rales  Abd: soft, nontender, obese  Ext: no edema Musculoskeletal:  No deformities Skin:  warm and dry  Neuro:  no gross focal abnormalities noted Psych:  Normal affect   EKG:  The EKG was personally reviewed and demonstrates:    SR/sinus arrhythmia, 74bpm, 1st degree AVblock 229ms, RBBB, LAD, PVC, PAC, blocked PAC  04/03/21 SR/sinus arrhythmia,  76, 1st degree  AVblock 210ms, RBBB, LAD, PAC 11/15/2019 SR 97bpm, 1st degree AVblock 293ms, RBBB  Telemetry:  Telemetry was personally reviewed and demonstrates:   SR 60's  Relevant CV Studies:   12/18/2020: TTE IMPRESSIONS  1. Left ventricular ejection fraction, by estimation, is 40 to 45%. The  left ventricle has mildly decreased function. The left ventricle  demonstrates global hypokinesis. The left ventricular internal cavity size  was moderately dilated. There is mild  left ventricular hypertrophy. Left ventricular diastolic parameters are  consistent with Grade II diastolic dysfunction (pseudonormalization).  Elevated left atrial pressure.  2. Right ventricular systolic function is mildly reduced. The right  ventricular size is severely enlarged. There is moderately elevated  pulmonary artery systolic pressure. The estimated right ventricular  systolic pressure is 123456 mmHg.  3. Left atrial size was mildly dilated.  4. Right atrial size was mildly dilated.  5. The mitral valve is normal in structure. No evidence of mitral valve  regurgitation.  6. The aortic valve was not well visualized. Aortic valve regurgitation  is not visualized. No aortic stenosis is present.  7. There is mild dilatation of the ascending aorta, measuring 37 mm.  8. The inferior vena cava is dilated in size with <50% respiratory  variability, suggesting right atrial pressure of 15 mmHg.    08/26/2018: coronary CT IMPRESSION: 1. Coronary calcium score of 148. This was 65 percentile for age and sex matched control.  2. Normal coronary origin with right dominance.  3. The study is affected significantly by motion. However, in the visualized portions of coronary arteries there is only mild non-obstructive plaque. Aggressive risk factor modification is recommended  4. Dilated pulmonary artery measuring 34 mm suggestive of pulmonary hypertension.   04/19/2017: LHC  Normal coronary arteries.  Global  hypokinesis, EF approximately 40%. LVEDP 18 mmHg.     Laboratory Data:  High Sensitivity Troponin:   Recent Labs  Lab 04/15/21 2053  TROPONINIHS 18*     Chemistry Recent Labs  Lab 04/09/21 1452 04/15/21 2053 04/16/21 0206  NA 135 140 139  K 4.2 3.6 3.9  CL 98 106 104  CO2 30 27 25   GLUCOSE 131* 113* 92  BUN 20 16 15   CREATININE 0.98 0.81 0.82  CALCIUM 9.3 9.0 9.2  GFRNONAA  --  >60 >60  ANIONGAP  --  7 10    Recent Labs  Lab 04/09/21 1452 04/15/21 2200  PROT 6.5 6.3*  ALBUMIN 4.0 3.8  AST 16 25  ALT 17 18  ALKPHOS 43 48  BILITOT 0.6 0.9   Hematology Recent Labs  Lab 04/09/21 1452 04/15/21 2053 04/16/21 0206  WBC 9.2 7.6 7.3  RBC 4.63 4.58 4.57  HGB 15.6 15.5 15.4  HCT 45.3 45.6 46.0  MCV 97.9 99.6 100.7*  MCH  --  33.8 33.7  MCHC 34.4 34.0 33.5  RDW 14.7 13.3 13.3  PLT 188.0 203 170   BNP Recent Labs  Lab 04/09/21 1452 04/15/21 2159  BNP  --  345.2*  PROBNP 156.0*  --     DDimer No results for input(s): DDIMER in the last 168 hours.   Radiology/Studies:  DG Chest Port 1 View  Result Date: 04/15/2021 CLINICAL DATA:  65 year old male with intermittent loss of consciousness. EXAM: PORTABLE CHEST 1 VIEW COMPARISON:  Chest radiograph dated 04/09/2021. FINDINGS: Mild cardiomegaly. No focal consolidation, pleural effusion, or pneumothorax. Atherosclerotic calcification of the aorta. No acute osseous pathology. IMPRESSION: No acute cardiopulmonary process. Electronically Signed   By: Anner Crete M.D.   On: 04/15/2021 21:51     Assessment and Plan:   1. CHB and hx of syncope  Monitor tracings reviewed with Dr. Curt Bears Agreed, CHB with V pause 13 seconds with a couple escape beats No nodal blocking agents or reversible causes Progressive baseline conduction system disease noted Recommend PPM implant Dr. Curt Bears has seen and examined the patient, discussed PPM recommendation and rational, the procedure and potential risks/benefits the  patient is agreeable. Janelis Stelzer plan for later today  2. 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 Curt Bears     Not certain needs Newman, and can monitor clearly via device going forward  3. NICM 4. Chronic CHF     He has been off his diuretics of late 2/2 dizzy spells and syncope     Lasix as ordered yesterday by HF team     CXR clear, no edema, does not appear overtly volume OL by my exam     ReDs clip 5 yesterday seems about his baseline     I have made HF team aware, they Lovina Zuver see him  5. COPD     Recently on levaquin for increased SOB/cough     ? voume     Follow with lasix and resumption of diuretic     At baseline on home PRN O2 qnd HS BIPAP     Chronic prednisone continued  6. HTN     Follow  7. Posterior neck pain     Since December after a fall/syncope     Worse with movement of his neck     No peripheral symptoms     w/u underway with his PMD, had been planned fro MRI next week, discussed this Tisha Cline need to wait at least 6 week post pacer, though could have a CT scan.  He Shiane Wenberg f/u with his PMD     Risk Assessment/Risk Scores:  {  For questions or updates, please contact [place] Please consult [email or web address] for contact info under    Signed, Baldwin Jamaica, PA-C  04/16/2021 9:03 AM;t  I have seen and examined this patient with Tommye Standard.  Agree with above, note added to reflect my findings.  On exam, RRR, no murmurs. Patient presented with episodes of syncope, found to have heart block on monitor. No reversible causes at this time. Arina Torry plan for pacemaker implant. Patient has CHF with an EF mildly reduced and RBBB. As he is likely to have a low amount of V pacing, Barbie Croston plan for dual chamber pacemaker.    Gianina Olinde M. Hedda Crumbley MD 04/16/2021 10:46 AM

## 2021-04-17 ENCOUNTER — Telehealth: Payer: Self-pay | Admitting: Internal Medicine

## 2021-04-17 ENCOUNTER — Encounter (HOSPITAL_COMMUNITY): Payer: Self-pay | Admitting: Cardiology

## 2021-04-17 ENCOUNTER — Inpatient Hospital Stay (HOSPITAL_COMMUNITY): Payer: 59

## 2021-04-17 ENCOUNTER — Telehealth: Payer: Self-pay | Admitting: Cardiology

## 2021-04-17 LAB — GLUCOSE, CAPILLARY: Glucose-Capillary: 214 mg/dL — ABNORMAL HIGH (ref 70–99)

## 2021-04-17 MED ORDER — CARVEDILOL 3.125 MG PO TABS: 3.1250 mg | ORAL_TABLET | Freq: Two times a day (BID) | ORAL | Status: DC

## 2021-04-17 MED ORDER — CARVEDILOL 3.125 MG PO TABS
3.1250 mg | ORAL_TABLET | Freq: Two times a day (BID) | ORAL | Status: DC
  Administered 2021-04-17: 3.125 mg via ORAL
  Filled 2021-04-17: qty 1

## 2021-04-17 MED ORDER — EMPAGLIFLOZIN 10 MG PO TABS
10.0000 mg | ORAL_TABLET | Freq: Every day | ORAL | Status: DC
  Filled 2021-04-17: qty 1

## 2021-04-17 MED ORDER — CARVEDILOL 3.125 MG PO TABS: 3.1250 mg | ORAL_TABLET | Freq: Two times a day (BID) | ORAL | 6 refills | Status: DC

## 2021-04-17 MED ORDER — EMPAGLIFLOZIN 10 MG PO TABS: 10.0000 mg | ORAL_TABLET | Freq: Every day | ORAL | 6 refills | Status: DC

## 2021-04-17 MED FILL — Lidocaine HCl Local Preservative Free (PF) Inj 1%: INTRAMUSCULAR | Qty: 30 | Status: AC

## 2021-04-17 MED FILL — Lidocaine HCl Local Inj 1%: INTRAMUSCULAR | Qty: 60 | Status: AC

## 2021-04-17 NOTE — Discharge Instructions (Signed)
Supplemental Discharge Instructions for  Pacemaker/Defibrillator Patients   Activity No heavy lifting or vigorous activity with your left/right arm for 6 to 8 weeks.  Do not raise your left/right arm above your head for one week.  Gradually raise your affected arm as drawn below.              04/23/21                      04/24/21                      04/25/21                    04/26/21 __  NO DRIVING until cleared to at your wound check visit.  WOUND CARE - Keep the wound area clean and dry.  Do not get this area wet , no showers until cleared to at your wound check visit. - The tape/steri-strips on your wound will fall off; do not pull them off.  No bandage is needed on the site.  DO  NOT apply any creams, oils, or ointments to the wound area. - If you notice any drainage or discharge from the wound, any swelling or bruising at the site, or you develop a fever > 101? F after you are discharged home, call the office at once.  Special Instructions - You are still able to use cellular telephones; use the ear opposite the side where you have your pacemaker/defibrillator.  Avoid carrying your cellular phone near your device. - When traveling through airports, show security personnel your identification card to avoid being screened in the metal detectors.  Ask the security personnel to use the hand wand. - Avoid arc welding equipment, MRI testing (magnetic resonance imaging), TENS units (transcutaneous nerve stimulators).  Call the office for questions about other devices. - Avoid electrical appliances that are in poor condition or are not properly grounded. - Microwave ovens are safe to be near or to operate.   Heart-Healthy Eating Plan Heart-healthy meal planning includes:  Eating less unhealthy fats.  Eating more healthy fats.  Making other changes in your diet. Talk with your doctor or a diet specialist (dietitian) to create an eating plan that is right for you. What is my  plan? Your doctor may recommend an eating plan that includes:  Total fat: ______% or less of total calories a day.  Saturated fat: ______% or less of total calories a day.  Cholesterol: less than _________mg a day. What are tips for following this plan? Cooking Avoid frying your food. Try to bake, boil, grill, or broil it instead. You can also reduce fat by:  Removing the skin from poultry.  Removing all visible fats from meats.  Steaming vegetables in water or broth. Meal planning  At meals, divide your plate into four equal parts: ? Fill one-half of your plate with vegetables and green salads. ? Fill one-fourth of your plate with whole grains. ? Fill one-fourth of your plate with lean protein foods.  Eat 4-5 servings of vegetables per day. A serving of vegetables is: ? 1 cup of raw or cooked vegetables. ? 2 cups of raw leafy greens.  Eat 4-5 servings of fruit per day. A serving of fruit is: ? 1 medium whole fruit. ?  cup of dried fruit. ?  cup of fresh, frozen, or canned fruit. ?  cup of 100% fruit juice.  Eat more foods that  have soluble fiber. These are apples, broccoli, carrots, beans, peas, and barley. Try to get 20-30 g of fiber per day.  Eat 4-5 servings of nuts, legumes, and seeds per week: ? 1 serving of dried beans or legumes equals  cup after being cooked. ? 1 serving of nuts is  cup. ? 1 serving of seeds equals 1 tablespoon.   General information  Eat more home-cooked food. Eat less restaurant, buffet, and fast food.  Limit or avoid alcohol.  Limit foods that are high in starch and sugar.  Avoid fried foods.  Lose weight if you are overweight.  Keep track of how much salt (sodium) you eat. This is important if you have high blood pressure. Ask your doctor to tell you more about this.  Try to add vegetarian meals each week. Fats  Choose healthy fats. These include olive oil and canola oil, flaxseeds, walnuts, almonds, and seeds.  Eat more  omega-3 fats. These include salmon, mackerel, sardines, tuna, flaxseed oil, and ground flaxseeds. Try to eat fish at least 2 times each week.  Check food labels. Avoid foods with trans fats or high amounts of saturated fat.  Limit saturated fats. ? These are often found in animal products, such as meats, butter, and cream. ? These are also found in plant foods, such as palm oil, palm kernel oil, and coconut oil.  Avoid foods with partially hydrogenated oils in them. These have trans fats. Examples are stick margarine, some tub margarines, cookies, crackers, and other baked goods. What foods can I eat? Fruits All fresh, canned (in natural juice), or frozen fruits. Vegetables Fresh or frozen vegetables (raw, steamed, roasted, or grilled). Green salads. Grains Most grains. Choose whole wheat and whole grains most of the time. Rice and pasta, including brown rice and pastas made with whole wheat. Meats and other proteins Lean, well-trimmed beef, veal, pork, and lamb. Chicken and [place] without skin. All fish and shellfish. Wild duck, rabbit, pheasant, and venison. Egg whites or low-cholesterol egg substitutes. Dried beans, peas, lentils, and tofu. Seeds and most nuts. Dairy Low-fat or nonfat cheeses, including ricotta and mozzarella. Skim or 1% milk that is liquid, powdered, or evaporated. Buttermilk that is made with low-fat milk. Nonfat or low-fat yogurt. Fats and oils Non-hydrogenated (trans-free) margarines. Vegetable oils, including soybean, sesame, sunflower, olive, peanut, safflower, corn, canola, and cottonseed. Salad dressings or mayonnaise made with a vegetable oil. Beverages Mineral water. Coffee and tea. Diet carbonated beverages. Sweets and desserts Sherbet, gelatin, and fruit ice. Small amounts of dark chocolate. Limit all sweets and desserts. Seasonings and condiments All seasonings and condiments. The items listed above may not be a complete list of foods and drinks you can  eat. Contact a dietitian for more options. What foods should I avoid? Fruits Canned fruit in heavy syrup. Fruit in cream or butter sauce. Fried fruit. Limit coconut. Vegetables Vegetables cooked in cheese, cream, or butter sauce. Fried vegetables. Grains Breads that are made with saturated or trans fats, oils, or whole milk. Croissants. Sweet rolls. Donuts. High-fat crackers, such as cheese crackers. Meats and other proteins Fatty meats, such as hot dogs, ribs, sausage, bacon, rib-eye roast or steak. High-fat deli meats, such as salami and bologna. Caviar. Domestic duck and goose. Organ meats, such as liver. Dairy Cream, sour cream, cream cheese, and creamed cottage cheese. Whole-milk cheeses. Whole or 2% milk that is liquid, evaporated, or condensed. Whole buttermilk. Cream sauce or high-fat cheese sauce. Yogurt that is made from whole milk. Fats  and oils Meat fat, or shortening. Cocoa butter, hydrogenated oils, palm oil, coconut oil, palm kernel oil. Solid fats and shortenings, including bacon fat, salt pork, lard, and butter. Nondairy cream substitutes. Salad dressings with cheese or sour cream. Beverages Regular sodas and juice drinks with added sugar. Sweets and desserts Frosting. Pudding. Cookies. Cakes. Pies. Milk chocolate or white chocolate. Buttered syrups. Full-fat ice cream or ice cream drinks. The items listed above may not be a complete list of foods and drinks to avoid. Contact a dietitian for more information. Summary  Heart-healthy meal planning includes eating less unhealthy fats, eating more healthy fats, and making other changes in your diet.  Eat a balanced diet. This includes fruits and vegetables, low-fat or nonfat dairy, lean protein, nuts and legumes, whole grains, and heart-healthy oils and fats. This information is not intended to replace advice given to you by your health care provider. Make sure you discuss any questions you have with your health care  provider. Document Revised: 02/10/2018 Document Reviewed: 01/14/2018 Elsevier Patient Education  2021 [employer].

## 2021-04-17 NOTE — Telephone Encounter (Signed)
Pt has been informed that request could not be granted and that an OV would be needed with PCP. I acknowledged that pt has an upcoming ED follow up OV on 5/2 and the concerns could be addressed then.

## 2021-04-17 NOTE — Progress Notes (Addendum)
Advanced Heart Failure Rounding Note  PCP-Cardiologist: Quay Burow, MD   Subjective:    Admitted with 3rd degree heart block. PPM placed.   ECHO completed and showed EF 30-35%.  Still complaining of neck/headache.    Objective:   Weight Range: 99.5 kg Body mass index is 31.48 kg/m.   Vital Signs:   Temp:  [97.8 F (36.6 C)-97.9 F (36.6 C)] 97.9 F (36.6 C) (04/28 0442) Pulse Rate:  [72-121] 86 (04/28 0442) Resp:  [12-27] 16 (04/28 0442) BP: (104-146)/(75-106) 122/90 (04/28 0442) SpO2:  [89 %-98 %] 95 % (04/28 0804) Weight:  [99.5 kg] 99.5 kg (04/28 0442) Last BM Date: 04/15/21  Weight change: Filed Weights   04/16/21 0247 04/16/21 0638 04/17/21 0442  Weight: 100.2 kg 105 kg 99.5 kg    Intake/Output:   Intake/Output Summary (Last 24 hours) at 04/17/2021 1017 Last data filed at 04/16/2021 1800 Gross per 24 hour  Intake 120 ml  Output --  Net 120 ml      Physical Exam    General:   No resp difficulty HEENT: Normal Neck: Supple. JVP difficult to assess. . Carotids 2+ bilat; no bruits. No lymphadenopathy or thyromegaly appreciated. Cor: PMI nondisplaced. Regular rate & rhythm. No rubs, gallops or murmurs. L upper chest steri strips Lungs: Clear Abdomen: Soft, nontender, nondistended. No hepatosplenomegaly. No bruits or masses. Good bowel sounds. Extremities: No cyanosis, clubbing, rash, edema Neuro: Alert & orientedx3, cranial nerves grossly intact. moves all 4 extremities w/o difficulty. Affect pleasant   Telemetry   Intermittent A pacing. 70-100s  EKG  n/a  Labs    CBC Recent Labs    04/15/21 2053 04/16/21 0206  WBC 7.6 7.3  HGB 15.5 15.4  HCT 45.6 46.0  MCV 99.6 100.7*  PLT 203 564   Basic Metabolic Panel Recent Labs    04/15/21 2053 04/16/21 0206  NA 140 139  K 3.6 3.9  CL 106 104  CO2 27 25  GLUCOSE 113* 92  BUN 16 15  CREATININE 0.81 0.82  CALCIUM 9.0 9.2   Liver Function Tests Recent Labs    04/15/21 2200   AST 25  ALT 18  ALKPHOS 48  BILITOT 0.9  PROT 6.3*  ALBUMIN 3.8   No results for input(s): LIPASE, AMYLASE in the last 72 hours. Cardiac Enzymes No results for input(s): CKTOTAL, CKMB, CKMBINDEX, TROPONINI in the last 72 hours.  BNP: BNP (last 3 results) Recent Labs    12/17/20 1134 04/15/21 2159  BNP 1,374.9* 345.2*    ProBNP (last 3 results) Recent Labs    04/09/21 1452  PROBNP 156.0*     D-Dimer No results for input(s): DDIMER in the last 72 hours. Hemoglobin A1C No results for input(s): HGBA1C in the last 72 hours. Fasting Lipid Panel No results for input(s): CHOL, HDL, LDLCALC, TRIG, CHOLHDL, LDLDIRECT in the last 72 hours. Thyroid Function Tests Recent Labs    04/16/21 0206  TSH 1.147    Other results:   Imaging    DG Chest 2 View  Result Date: 04/17/2021 CLINICAL DATA:  Post pacemaker. EXAM: CHEST - 2 VIEW COMPARISON:  Chest x-ray 04/15/2021. FINDINGS: Cardiac pacer noted with lead tips in right atrium right ventricle. Cardiomegaly. No pulmonary venous congestion. No focal infiltrate. No pleural effusion or pneumothorax. Exam limited as the patient could not lift left arm. Posterior costophrenic angles not imaged on lateral view. IMPRESSION: 1. Cardiac pacer noted with lead tips in right atrium right ventricle. Cardiomegaly. No pulmonary  venous congestion. 2.  No acute pulmonary disease.  No pneumothorax. Electronically Signed   By: Marcello Moores  Register   On: 04/17/2021 07:04   EP PPM/ICD IMPLANT  Result Date: 04/16/2021 SURGEON:  Allegra Lai, MD   PREPROCEDURE DIAGNOSIS:  Intermittent heart block, syncope   POSTPROCEDURE DIAGNOSIS:  Intermittent complete heart block, syncope    PROCEDURES:  1. Pacemaker implantation.   INTRODUCTION:  Robert Church is a 65 y.o. male with a history of bradycardia who presents today for pacemaker implantation.  The patient reports intermittent episodes of dizziness over the past few months.  No reversible causes have  been identified.  The patient therefore presents today for pacemaker implantation.   DESCRIPTION OF PROCEDURE:  Informed written consent was obtained, and  the patient was brought to the electrophysiology lab in a fasting state.  The patient required no sedation for the procedure today.  The patients left chest was prepped and draped in the usual sterile fashion by the EP lab staff. The skin overlying the left deltopectoral region was infiltrated with lidocaine for local analgesia.  A 4-cm incision was made over the left deltopectoral region.  A left subcutaneous pacemaker pocket was fashioned using a combination of sharp and blunt dissection. Electrocautery was required to assure hemostasis.  RA/RV Lead Placement: The left axillary vein was therefore cannulated.  Through the left axillary vein, a [employer] model Tendril MRI [id number] (serial number  [id number]) right atrial lead and an Abbott model Tendril MRI [id number] (serial number  [id number]) right ventricular lead were advanced with fluoroscopic visualization into the right atrial appendage and right ventricular apex positions respectively.  Initial atrial lead P- waves measured 6.1 mV with impedance of 776 ohms and a threshold of 0.9 V at 0.5 msec.  Right ventricular lead R-waves measured 12.7 mV with an impedance of 737 ohms and a threshold of 0.8 V at 0.5 msec.  Both leads were secured to the pectoralis fascia using #2-0 silk over the suture sleeves. Device Placement:  The leads were then connected to a [employer] MRI  model [id number] (serial number  [id number] ) pacemaker.  The pocket was irrigated with copious gentamicin solution.  The pacemaker was then placed into the pocket.  The pocket was then closed in 3 layers with 2.0 Vicryl suture for the 3.0 Vicryl suture subcutaneous and subcuticular layers.  Steri-  Strips and a sterile dressing were then applied. EBL<89ml.  There were no early apparent complications.   CONCLUSIONS:  1. Successful  implantation of a [employer] Medical Assurity MRI dual-chamber pacemaker for symptomatic bradycardia  2. No early apparent complications.       Will Curt Bears, MD 04/16/2021 4:53 PM   ECHOCARDIOGRAM COMPLETE  Result Date: 04/16/2021    ECHOCARDIOGRAM REPORT   Patient Name:   Robert Church Date of Exam: 04/16/2021 Medical Rec #:  [id number]           Height:       70.0 in Accession #:    [id number]          Weight:       231.5 lb Date of Birth:  06/20/1956            BSA:          2.221 m Patient Age:    31 years            BP:           130/78 mmHg Patient Gender: M  HR:           85 bpm. Exam Location:  Inpatient Procedure: 2D Echo, Cardiac Doppler and Color Doppler Indications:    CHF  History:        Patient has prior history of Echocardiogram examinations, most                 recent 12/18/2020. COPD; Risk Factors:Hypertension.  Sonographer:    Cammy Brochure Referring Phys: [phone] AMY D CLEGG IMPRESSIONS  1. Left ventricular ejection fraction, by estimation, is 30 to 35%. The left ventricle has moderately decreased function. The left ventricle demonstrates global hypokinesis. The left ventricular internal cavity size was moderately dilated. There is moderate left ventricular hypertrophy. Left ventricular diastolic parameters are indeterminate.  2. Right ventricular systolic function is mildly reduced. The right ventricular size is normal. Tricuspid regurgitation signal is inadequate for assessing PA pressure.  3. The mitral valve is normal in structure. Trivial mitral valve regurgitation.  4. The aortic valve is tricuspid. Aortic valve regurgitation is not visualized. No aortic stenosis is present.  5. The inferior vena cava is normal in size with greater than 50% respiratory variability, suggesting right atrial pressure of 3 mmHg. Comparison(s): Compared to prior, LVEF has decreased. FINDINGS  Left Ventricle: Left ventricular ejection fraction, by estimation, is 30 to 35%. The left  ventricle has moderately decreased function. The left ventricle demonstrates global hypokinesis. The left ventricular internal cavity size was moderately dilated. There is moderate left ventricular hypertrophy. Left ventricular diastolic parameters are indeterminate. Right Ventricle: The right ventricular size is normal. Right vetricular wall thickness was not well visualized. Right ventricular systolic function is mildly reduced. Tricuspid regurgitation signal is inadequate for assessing PA pressure. Left Atrium: Left atrial size was normal in size. Right Atrium: Right atrial size was normal in size. Pericardium: Trivial pericardial effusion is present. Mitral Valve: The mitral valve is normal in structure. Trivial mitral valve regurgitation. Tricuspid Valve: The tricuspid valve is normal in structure. Tricuspid valve regurgitation is not demonstrated. Aortic Valve: The aortic valve is tricuspid. Aortic valve regurgitation is not visualized. No aortic stenosis is present. Aortic valve mean gradient measures 3.7 mmHg. Aortic valve peak gradient measures 6.4 mmHg. Aortic valve area, by VTI measures 2.24 cm. Pulmonic Valve: The pulmonic valve was grossly normal. Pulmonic valve regurgitation is not visualized. Aorta: The aortic root and ascending aorta are structurally normal, with no evidence of dilitation. Venous: The inferior vena cava is normal in size with greater than 50% respiratory variability, suggesting right atrial pressure of 3 mmHg. IAS/Shunts: No atrial level shunt detected by color flow Doppler.  LEFT VENTRICLE PLAX 2D LVIDd:         6.80 cm LVIDs:         5.30 cm LV PW:         1.50 cm LV IVS:        1.50 cm LVOT diam:     2.20 cm LV SV:         48 LV SV Index:   21 LVOT Area:     3.80 cm  LV Volumes (MOD) LV vol d, MOD A2C: 222.0 ml LV vol d, MOD A4C: 225.0 ml LV vol s, MOD A2C: 124.0 ml LV vol s, MOD A4C: 125.0 ml LV SV MOD A2C:     98.0 ml LV SV MOD A4C:     225.0 ml LV SV MOD BP:      99.7 ml  RIGHT VENTRICLE  IVC RV S prime:     7.83 cm/s  IVC diam: 1.50 cm LEFT ATRIUM             Index       RIGHT ATRIUM           Index LA diam:        4.40 cm 1.98 cm/m  RA Area:     13.50 cm LA Vol (A2C):   56.1 ml 25.26 ml/m RA Volume:   30.00 ml  13.51 ml/m LA Vol (A4C):   56.7 ml 25.53 ml/m LA Biplane Vol: 58.4 ml 26.29 ml/m  AORTIC VALVE AV Area (Vmax):    2.39 cm AV Area (Vmean):   2.19 cm AV Area (VTI):     2.24 cm AV Vmax:           126.27 cm/s AV Vmean:          88.000 cm/s AV VTI:            0.212 m AV Peak Grad:      6.4 mmHg AV Mean Grad:      3.7 mmHg LVOT Vmax:         79.33 cm/s LVOT Vmean:        50.600 cm/s LVOT VTI:          0.125 m LVOT/AV VTI ratio: 0.59  AORTA Ao Root diam: 3.40 cm Ao Asc diam:  3.10 cm MITRAL VALVE MV Area (PHT): 6.60 cm     SHUNTS MV Decel Time: 115 msec     Systemic VTI:  0.12 m MV E velocity: 108.00 cm/s  Systemic Diam: 2.20 cm Oswaldo Milian MD Electronically signed by Oswaldo Milian MD Signature Date/Time: 04/16/2021/7:23:17 PM    Final       Medications:     Scheduled Medications: . allopurinol  300 mg Oral Daily  . carvedilol  3.125 mg Oral BID WC  . empagliflozin  10 mg Oral Daily  . furosemide  40 mg Oral Daily  . losartan  12.5 mg Oral QHS  . metFORMIN  750 mg Oral Q breakfast  . mometasone-formoterol  2 puff Inhalation BID  . montelukast  10 mg Oral QHS  . nicotine  21 mg Transdermal Daily  . pantoprazole  40 mg Oral Daily  . predniSONE  10 mg Oral Q breakfast  . sodium chloride flush  3 mL Intravenous Q12H  . sodium chloride flush  3 mL Intravenous Q12H  . sodium chloride flush  3 mL Intravenous Q12H  . umeclidinium bromide  1 puff Inhalation Daily     Infusions: . sodium chloride       PRN Medications:  sodium chloride, acetaminophen, albuterol, polyethylene glycol, sodium chloride flush    Assessment/Plan  1. CHB Had syncope on several occasions. Outpatient monitor showed episode of CHB.  EP plan  for [facility] today.   2. Chronic Systolic HF, NICM  cath 7/85 normal cors. EF 48%  - Myoview 07/21/2018 showed EF 38% inferior scar with septal ischemia.  - Echo 07/07/2018 revealed EF 30-35%. RV normal.  - Cardiac CT 08/26/18. Calcium score 148 (74th percentile) LAD 25% otherwise normal cors. Dilated pulmonary arteries suggestive of PH.  - ECHO 11/2020 EF 40-45%---> ECHO EF 30-35% - Suspect NICM due to HTN and inadequately treated OSA - He has stopped  spiro, torsemide, jardiance, bisoprolol prior to admit due to dizziness - Given EF back down will need to start back  GDMT - Continue lasix 40  mg daily -Start jardiance 10 mg daily. He has this at home.  - Start coreg 3.125 mg twice a day. -- Continue losartan 12.5 mg at bed time. ---At his follow up will consider entresto and spiro.   3. PAF New A fib reported on outpatient monitor. EP reviewed and they do not think he had A fib.  - Eliquis on hold.   4. OSA Uses Bipap  5. COPD Continue oxgen as needed.   6. HTN  Continue current dose of losartan and adjust after procedure  F/U in HF clinic HF meds d/c - He want to use his pharmacy.  Continue lasix 40 mg daily- this is already at his pharmacy to pick upl -Start jardiance 10 mg daily. He has this at home.  - Start coreg 3.125 mg twice a day. -- Continue losartan 12.5 mg at bed time--> this already at his pharmacy to pick up.   Length of Stay: 1  Amy Clegg, NP  04/17/2021, 10:17 AM  Advanced Heart Failure Team Pager [phone] (M-F; 7a - 5p)  Please contact [facility] for night-coverage after hours (5p -7a ) and weekends on [email or web address]  Patient seen and examined with the above-signed Advanced Practice Provider and/or Housestaff. I personally reviewed laboratory data, imaging studies and relevant notes. I independently examined the patient and formulated the important aspects of the plan. I have edited the note to reflect any of my changes or salient points. I have  personally discussed the plan with the patient and/or family.   Now s/p PPM for CHB. Echo back with EF 30-35% (previously 40-45%) - reviewed personally. Denies SOB, orthopnea or PND  Agree with restarting GDMT.   F/u in HF Clinic. Suspect EF will improve with medical Rx.   Glori Bickers, MD  10:49 AM

## 2021-04-17 NOTE — Telephone Encounter (Signed)
Patient states he just got home from having his pacemaker implanted and has 4 stickers on his breast. He would like to know if he needs to leave them on or take them off. He states he would like a call back today if possible.

## 2021-04-17 NOTE — Telephone Encounter (Signed)
Patient called and is requesting a CT scan of his head and neck. He said Dr. Ronnald Ramp ordered an MRI back in January but he never got it done. Please advise

## 2021-04-17 NOTE — Telephone Encounter (Signed)
Returning phone call. Patient has 4 EKG stickers under his left breast. Advised patient he can remove them. Patient thanked me for the call.

## 2021-04-17 NOTE — Telephone Encounter (Signed)
Pt was recently seen in the ED on 04/15/21. Please advise.

## 2021-04-17 NOTE — TOC Transition Note (Signed)
Transition of Care [facility]) - CM/SW Discharge Note Heart Failure   Patient Details  Name: Robert Church MRN: [id number] Date of Birth: 06/27/1956  [facility] CM/SW Contact:  [place], [place] Phone Number: 04/17/2021, 12:57 PM   Clinical Narrative:    CSW spoke with the patient and his mother and grandmother at bedside and brought patient an appointment card and reiterated to the patient to follow up with the [place] clinic appointment on Wednesday 04/23/21 at 11:00am and encouraged them to follow up with the HF outpatient clinic upon discharge as there is a Education officer, museum at the clinic who can provide assistance as well. CSW with patients permission scheduled the patient for a hospital follow up appointment with his primary care doctor Ronnald Ramp on Monday 04/21/21 at 3:20. CSW obtained the patients signature for [employer] and Release of Liability and sent enrollment forms to [employer] for the patients upcoming appointments.  CSW will sign off for now as social work intervention is no longer needed. Please consult [place] again if new needs arise.  Final next level of care: Home/Self Care Barriers to Discharge: No Barriers Identified   Patient Goals and CMS Choice        Discharge Placement                       Discharge Plan and Services In-house Referral: Clinical Social Work                                   Social Determinants of Health (SDOH) Interventions Food Insecurity Interventions: Intervention Not Indicated Financial Strain Interventions: Intervention Not Indicated Housing Interventions: Intervention Not Indicated Transportation Interventions: Intervention Not Indicated   Readmission Risk Interventions No flowsheet data found.   Sundus Pete, MSW, [place] Heart Failure Social Worker

## 2021-04-17 NOTE — Telephone Encounter (Signed)
I am not comfortable whit his request

## 2021-04-18 ENCOUNTER — Telehealth: Payer: Self-pay

## 2021-04-18 NOTE — Telephone Encounter (Signed)
Spoke with pt.  He states last night he was sitting in a chair, he suddenly felt dizzy.  He checked his HR and BP, results HR 49, BP 117/72.  He reports he has been drinking enough fluids and at the time he was not changing position.  The episode lasted for about 5-10 seconds.    Current meds include: Carvedilol 3.125mg  BID, Lasix 40mg  daily.  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   Pt sent manual transmission from PPM, Normal device function, currently AP 14%, VP 10%.  Presenting Rhythm SR 104 somewhat irregular.  HR histograms show atrial rate down in the 40's and paced <1%  Advised pt, PPM is working properly, nothing on report to explain his symptoms.  Will continue to monitor for now.  If episodes continue, let us know.  Pt scheduled Device clinic appt on 5/10 for wound check.

## 2021-04-18 NOTE — Telephone Encounter (Cosign Needed)
Transition Care Management Follow-up Telephone Call  Date of discharge and from where: 04/17/21 [facility]   How have you been since you were released from the hospital? [place]   Any questions or concerns? No  Items Reviewed:  Did the pt receive and understand the discharge instructions provided? Yes   Medications obtained and verified? Yes   Other? No   Any new allergies since your discharge? No   Dietary orders reviewed? Yes  Do you have support at home? Yes   Home Care and Equipment/Supplies: Were home health services ordered? not applicable If so, what is the name of the agency?   Has the agency set up a time to come to the patient's home? not applicable Were any new equipment or medical supplies ordered?  No What is the name of the medical supply agency?  Were you able to get the supplies/equipment? not applicable Do you have any questions related to the use of the equipment or supplies? No  Functional Questionnaire: (I = Independent and D = Dependent) ADLs: I  Bathing/Dressing- I  Meal Prep- I  Eating- I  Maintaining continence- I  Transferring/Ambulation- I  Managing Meds- I  Follow up appointments reviewed:   PCP Hospital f/u appt confirmed? Yes  Scheduled to see Dr Marcello Moores  on 04/21/21 @ 3:20.  [facility] f/u appt confirmed? Yes  Scheduled to see Cardiology/wound check on 04/29/21 @ 10 & 12:00p.m.  Are transportation arrangements needed? No   If their condition worsens, is the pt aware to call PCP or go to the Emergency Dept.? Yes  Was the patient provided with contact information for the PCP's office or ED? Yes  Was to pt encouraged to call back with questions or concerns? Yes

## 2021-04-18 NOTE — Telephone Encounter (Signed)
The patient states last night he felt dizzy and his heart rate went down to 49 bpm. I asked him to send a transmission with his home remote monitor. Transmission received. I told him the nurse will review it and give him a call back.

## 2021-04-19 ENCOUNTER — Other Ambulatory Visit: Payer: 59

## 2021-04-21 ENCOUNTER — Ambulatory Visit (INDEPENDENT_AMBULATORY_CARE_PROVIDER_SITE_OTHER): Payer: 59 | Admitting: Internal Medicine

## 2021-04-21 ENCOUNTER — Other Ambulatory Visit: Payer: Self-pay

## 2021-04-21 ENCOUNTER — Encounter: Payer: Self-pay | Admitting: Internal Medicine

## 2021-04-21 VITALS — BP 116/60 | HR 70 | Temp 98.8°F | Ht 70.0 in | Wt 223.0 lb

## 2021-04-21 DIAGNOSIS — M5030 Other cervical disc degeneration, unspecified cervical region: Secondary | ICD-10-CM

## 2021-04-21 DIAGNOSIS — E118 Type 2 diabetes mellitus with unspecified complications: Secondary | ICD-10-CM

## 2021-04-21 DIAGNOSIS — I10 Essential (primary) hypertension: Secondary | ICD-10-CM

## 2021-04-21 NOTE — Patient Instructions (Signed)
Type 2 Diabetes Mellitus, Diagnosis, Adult Type 2 diabetes (type 2 diabetes mellitus) is a long-term, or chronic, disease. In type 2 diabetes, one or both of these problems may be present:  The pancreas does not make enough of a hormone called insulin.  Cells in the body do not respond properly to insulin that the body makes (insulin resistance). Normally, insulin allows blood sugar (glucose) to enter cells in the body. The cells use glucose for energy. Insulin resistance or lack of insulin causes excess glucose to build up in the blood instead of going into cells. This causes high blood glucose (hyperglycemia).  What are the causes? The exact cause of type 2 diabetes is not known. What increases the risk? The following factors may make you more likely to develop this condition:  Having a family member with type 2 diabetes.  Being overweight or obese.  Being inactive (sedentary).  Having been diagnosed with insulin resistance.  Having a history of prediabetes, diabetes when you were pregnant (gestational diabetes), or polycystic ovary syndrome (PCOS). What are the signs or symptoms? In the early stage of this condition, you may not have symptoms. Symptoms develop slowly and may include:  Increased thirst or hunger.  Increased urination.  Unexplained weight loss.  Tiredness (fatigue) or weakness.  Vision changes, such as blurry vision.  Dark patches on the skin. How is this diagnosed? This condition is diagnosed based on your symptoms, your medical history, a physical exam, and your blood glucose level. Your blood glucose may be checked with one or more of the following blood tests:  A fasting blood glucose (FBG) test. You will not be allowed to eat (you will fast) for 8 hours or longer before a blood sample is taken.  A random blood glucose test. This test checks blood glucose at any time of day regardless of when you ate.  An A1C (hemoglobin A1C) blood test. This test  provides information about blood glucose levels over the previous 2-3 months.  An oral glucose tolerance test (OGTT). This test measures your blood glucose at two times: ? After fasting. This is your baseline blood glucose level. ? Two hours after drinking a beverage that contains glucose. You may be diagnosed with type 2 diabetes if:  Your fasting blood glucose level is 126 mg/dL (7.0 mmol/L) or higher.  Your random blood glucose level is 200 mg/dL (11.1 mmol/L) or higher.  Your A1C level is 6.5% or higher.  Your oral glucose tolerance test result is higher than 200 mg/dL (11.1 mmol/L). These blood tests may be repeated to confirm your diagnosis.   How is this treated? Your treatment may be managed by a specialist called an endocrinologist. Type 2 diabetes may be treated by following instructions from your health care provider about:  Making dietary and lifestyle changes. These may include: ? Following a personalized nutrition plan that is developed by a registered dietitian. ? Exercising regularly. ? Finding ways to manage stress.  Checking your blood glucose level as often as told.  Taking diabetes medicines or insulin daily. This helps to keep your blood glucose levels in the healthy range.  Taking medicines to help prevent complications from diabetes. Medicines may include: ? Aspirin. ? Medicine to lower cholesterol. ? Medicine to control blood pressure. Your health care provider will set treatment goals for you. Your goals will be based on your age, other medical conditions you have, and how you respond to diabetes treatment. Generally, the goal of treatment is to maintain the   following blood glucose levels:  Before meals: 80-130 mg/dL (4.4-7.2 mmol/L).  After meals: below 180 mg/dL (10 mmol/L).  A1C level: less than 7%. Follow these instructions at home: Questions to ask your health care provider Consider asking the following questions:  Should I meet with a certified  diabetes care and education specialist?  What diabetes medicines do I need, and when should I take them?  What equipment will I need to manage my diabetes at home?  How often do I need to check my blood glucose?  Where can I find a support group for people with diabetes?  What number can I call if I have questions?  When is my next appointment? General instructions  Take over-the-counter and prescription medicines only as told by your health care provider.  Keep all follow-up visits as told by your health care provider. This is important. Where to find more information  American Diabetes Association (ADA): www.diabetes.org  American Association of Diabetes Care and Education Specialists (ADCES): www.diabeteseducator.org  International Diabetes Federation (IDF): www.idf.org Contact a health care provider if:  Your blood glucose is at or above 240 mg/dL (13.3 mmol/L) for 2 days in a row.  You have been sick or have had a fever for 2 days or longer, and you are not getting better.  You have any of the following problems for more than 6 hours: ? You cannot eat or drink. ? You have nausea and vomiting. ? You have diarrhea. Get help right away if:  You have severe hypoglycemia. This means your blood glucose is lower than 54 mg/dL (3.0 mmol/L).  You become confused or you have trouble thinking clearly.  You have difficulty breathing.  You have moderate or large ketone levels in your urine. These symptoms may represent a serious problem that is an emergency. Do not wait to see if the symptoms will go away. Get medical help right away. Call your local emergency services (911 in the [place] Do not drive yourself to the hospital. Summary  Type 2 diabetes (type 2 diabetes mellitus) is a long-term, or chronic, disease. In type 2 diabetes, the pancreas does not make enough of a hormone called insulin, or cells in the body do not respond properly to insulin that the body makes (insulin  resistance).  This condition is treated by making dietary and lifestyle changes and taking diabetes medicines or insulin.  Your health care provider will set treatment goals for you. Your goals will be based on your age, other medical conditions you have, and how you respond to diabetes treatment.  Keep all follow-up visits as told by your health care provider. This is important. This information is not intended to replace advice given to you by your health care provider. Make sure you discuss any questions you have with your health care provider. Document Revised: 07/03/2020 Document Reviewed: 07/03/2020 Elsevier Patient Education  2021 [employer]  

## 2021-04-21 NOTE — Progress Notes (Signed)
Subjective:  Patient ID: Aman Bonet, male    DOB: 1956/01/02  Age: 65 y.o. MRN: [id number]  CC: Hypertension and Diabetes  This visit occurred during the SARS-CoV-2 public health emergency.  Safety protocols were in place, including screening questions prior to the visit, additional usage of staff PPE, and extensive cleaning of exam room while observing appropriate contact time as indicated for disinfecting solutions.    HPI Burak Zerbe presents for f/up -   He is not willing to take Jardiance because it causes lightheadedness.  He recently had a pacemaker placed after he was found to be in third-degree heart block.  He continues to complain of diffuse neck pain that radiates up into his head.  The pain does not radiate into the extremities and he denies paresthesias.  He had plain films done about 3 months ago that showed degenerative disc disease with some encroachment into the neuroforamen.  Outpatient Medications Prior to Visit  Medication Sig Dispense Refill  . albuterol (PROVENTIL) (2.5 MG/3ML) 0.083% nebulizer solution Take 3 mLs (2.5 mg total) by nebulization every 6 (six) hours as needed for wheezing or shortness of breath. 75 mL 12  . albuterol (VENTOLIN HFA) 108 (90 Base) MCG/ACT inhaler INHALE 2 PUFFS INTO THE LUNGS EVERY 6 HOURS AS NEEDED FOR WHEEZING (Patient taking differently: Inhale 2 puffs into the lungs every 6 (six) hours as needed for wheezing.) 18 g 2  . allopurinol (ZYLOPRIM) 300 MG tablet Take 1 tablet (300 mg total) by mouth daily. 90 tablet 1  . carvedilol (COREG) 3.125 MG tablet Take 1 tablet (3.125 mg total) by mouth 2 (two) times daily with a meal. 60 tablet 6  . furosemide (LASIX) 40 MG tablet Take 1 tablet (40 mg total) by mouth daily. 30 tablet 3  . Lidocaine, Anorectal, 5 % CREA Apply topically as needed.    Marland Kitchen losartan (COZAAR) 25 MG tablet Take 0.5 tablets (12.5 mg total) by mouth at bedtime. 15 tablet 3  . metFORMIN (GLUCOPHAGE XR) 750 MG 24  hr tablet Take 1 tablet (750 mg total) by mouth daily with breakfast. 90 tablet 1  . mometasone-formoterol (DULERA) 200-5 MCG/ACT AERO Dulera 200 mcg-5 mcg/actuation HFA aerosol inhaler  INHALE 2 PUFFS BY MOUTH FIRST THING IN THE MORNING AND THEN ANOTHER 2 PUFFS ABOUT 12 HOURS LATER (Patient taking differently: Inhale 2 puffs into the lungs every 12 (twelve) hours.) 1 each 2  . montelukast (SINGULAIR) 10 MG tablet Take 1 tablet (10 mg total) by mouth at bedtime. 30 tablet 5  . omeprazole (PRILOSEC) 20 MG capsule Take 1 capsule (20 mg total) by mouth 2 (two) times daily before a meal. 180 capsule 1  . polyethylene glycol (MIRALAX / GLYCOLAX) 17 g packet Take 17 g by mouth daily as needed. (Patient taking differently: Take 17 g by mouth daily as needed for mild constipation.) 14 each 0  . predniSONE (DELTASONE) 10 MG tablet Take 1 tablet (10 mg total) by mouth daily with breakfast. (Patient taking differently: Take 10 mg by mouth See admin instructions. TAKE 3 TABLETS BY MOUTH EVERY DAY FOR 3 DAYS THEN TAKE 2 TABLETS BY MOUTH EVERY DAY FOR 3 DAYS THEN TAKE 1 TABLET BY MOUTH EVERY DAY FOR 3 DAYS) 30 tablet 2  . Tiotropium Bromide Monohydrate (SPIRIVA RESPIMAT) 2.5 MCG/ACT AERS Inhale 2.5 mcg into the lungs 2 (two) times daily. 4 g 11  . Zinc 100 MG TABS Take 1 tablet by mouth daily.    . empagliflozin (  JARDIANCE) 10 MG TABS tablet Take 1 tablet (10 mg total) by mouth daily. (Patient not taking: Reported on 04/21/2021) 30 tablet 6   No facility-administered medications prior to visit.    ROS Review of Systems  Constitutional: Negative for appetite change, diaphoresis, fatigue and unexpected weight change.  HENT: Negative.  Negative for trouble swallowing.   Eyes: Negative for visual disturbance.  Respiratory: Negative for cough, chest tightness, shortness of breath and wheezing.   Cardiovascular: Negative for chest pain, palpitations and leg swelling.  Gastrointestinal: Negative for abdominal pain,  constipation, diarrhea, nausea and vomiting.  Genitourinary: Negative.  Negative for difficulty urinating.  Musculoskeletal: Positive for back pain and neck pain. Negative for arthralgias.  Skin: Negative for color change.  Neurological: Negative.  Negative for dizziness, weakness and numbness.  Hematological: Negative for adenopathy. Does not bruise/bleed easily.  Psychiatric/Behavioral: Negative.     Objective:  BP 116/60 (BP Location: Right Arm, Patient Position: Sitting, Cuff Size: Large)   Pulse 70   Temp 98.8 F (37.1 C) (Oral)   Ht 5\' 10"  (1.778 m)   Wt 223 lb (101.2 kg)   SpO2 95%   BMI 32.00 kg/m   BP Readings from Last 3 Encounters:  04/21/21 116/60  04/17/21 122/73  04/15/21 (!) 142/98    Wt Readings from Last 3 Encounters:  04/21/21 223 lb (101.2 kg)  04/17/21 219 lb 6.4 oz (99.5 kg)  04/15/21 229 lb (103.9 kg)    Physical Exam Vitals reviewed.  HENT:     Nose: Nose normal.     Mouth/Throat:     Mouth: Mucous membranes are moist.  Eyes:     Conjunctiva/sclera: Conjunctivae normal.  Cardiovascular:     Rate and Rhythm: Normal rate and regular rhythm.     Heart sounds: No murmur heard.   Pulmonary:     Effort: Pulmonary effort is normal.     Breath sounds: No stridor. No wheezing, rhonchi or rales.  Abdominal:     General: Abdomen is flat. Bowel sounds are normal. There is no distension.     Palpations: Abdomen is soft. There is no hepatomegaly, splenomegaly or mass.     Tenderness: There is no abdominal tenderness.  Musculoskeletal:        General: Normal range of motion.     Cervical back: Neck supple.     Right lower leg: No edema.     Left lower leg: No edema.  Skin:    General: Skin is warm and dry.     Coloration: Skin is not pale.  Neurological:     General: No focal deficit present.     Mental Status: He is alert.     Lab Results  Component Value Date   WBC 7.3 04/16/2021   HGB 15.4 04/16/2021   HCT 46.0 04/16/2021   PLT 170  04/16/2021   GLUCOSE 92 04/16/2021   CHOL 209 (H) 03/25/2021   TRIG 216.0 (H) 03/25/2021   HDL 45.20 03/25/2021   LDLDIRECT 150.0 03/25/2021   LDLCALC 108 (H) 07/10/2020   ALT 18 04/15/2021   AST 25 04/15/2021   NA 139 04/16/2021   K 3.9 04/16/2021   CL 104 04/16/2021   CREATININE 0.82 04/16/2021   BUN 15 04/16/2021   CO2 25 04/16/2021   TSH 1.147 04/16/2021   PSA 1.7 07/10/2020   INR 0.9 04/15/2021   HGBA1C 6.3 03/25/2021    EP PPM/ICD IMPLANT  Result Date: 04/16/2021 SURGEON:  Allegra Lai, MD   PREPROCEDURE  DIAGNOSIS:  Intermittent heart block, syncope   POSTPROCEDURE DIAGNOSIS:  Intermittent complete heart block, syncope    PROCEDURES:  1. Pacemaker implantation.   INTRODUCTION:  Alexios Borton is a 65 y.o. male with a history of bradycardia who presents today for pacemaker implantation.  The patient reports intermittent episodes of dizziness over the past few months.  No reversible causes have been identified.  The patient therefore presents today for pacemaker implantation.   DESCRIPTION OF PROCEDURE:  Informed written consent was obtained, and  the patient was brought to the electrophysiology lab in a fasting state.  The patient required no sedation for the procedure today.  The patients left chest was prepped and draped in the usual sterile fashion by the EP lab staff. The skin overlying the left deltopectoral region was infiltrated with lidocaine for local analgesia.  A 4-cm incision was made over the left deltopectoral region.  A left subcutaneous pacemaker pocket was fashioned using a combination of sharp and blunt dissection. Electrocautery was required to assure hemostasis.  RA/RV Lead Placement: The left axillary vein was therefore cannulated.  Through the left axillary vein, a [employer] model Tendril MRI [id number] (serial number  [id number]) right atrial lead and an [employer] model Tendril MRI [id number] (serial number  [id number]) right ventricular lead were advanced with  fluoroscopic visualization into the right atrial appendage and right ventricular apex positions respectively.  Initial atrial lead P- waves measured 6.1 mV with impedance of 776 ohms and a threshold of 0.9 V at 0.5 msec.  Right ventricular lead R-waves measured 12.7 mV with an impedance of 737 ohms and a threshold of 0.8 V at 0.5 msec.  Both leads were secured to the pectoralis fascia using #2-0 silk over the suture sleeves. Device Placement:  The leads were then connected to a [place] MRI  model [id number] (serial number  [id number] ) pacemaker.  The pocket was irrigated with copious gentamicin solution.  The pacemaker was then placed into the pocket.  The pocket was then closed in 3 layers with 2.0 Vicryl suture for the 3.0 Vicryl suture subcutaneous and subcuticular layers.  Steri-  Strips and a sterile dressing were then applied. EBL<13ml.  There were no early apparent complications.   CONCLUSIONS:  1. Successful implantation of a [employer]urity MRI dual-chamber pacemaker for symptomatic bradycardia  2. No early apparent complications.       Allegra Lai, MD 04/16/2021 4:53 PM   DG Chest Port 1 View  Result Date: 04/15/2021 CLINICAL DATA:  65 year old male with intermittent loss of consciousness. EXAM: PORTABLE CHEST 1 VIEW COMPARISON:  Chest radiograph dated 04/09/2021. FINDINGS: Mild cardiomegaly. No focal consolidation, pleural effusion, or pneumothorax. Atherosclerotic calcification of the aorta. No acute osseous pathology. IMPRESSION: No acute cardiopulmonary process. Electronically Signed   By: Anner Crete M.D.   On: 04/15/2021 21:51   ECHOCARDIOGRAM COMPLETE  Result Date: 04/16/2021    ECHOCARDIOGRAM REPORT   Patient Name:   MARQUI DEHAVEN Date of Exam: 04/16/2021 Medical Rec #:  [id number]           Height:       70.0 in Accession #:    [id number]          Weight:       231.5 lb Date of Birth:  1956-05-26            BSA:          2.221 m Patient Age:    75 years  BP:           130/78 mmHg Patient Gender: M                   HR:           85 bpm. Exam Location:  Inpatient Procedure: 2D Echo, Cardiac Doppler and Color Doppler Indications:    CHF  History:        Patient has prior history of Echocardiogram examinations, most                 recent 12/18/2020. COPD; Risk Factors:Hypertension.  Sonographer:    Cammy Brochure Referring Phys: [phone] AMY D CLEGG IMPRESSIONS  1. Left ventricular ejection fraction, by estimation, is 30 to 35%. The left ventricle has moderately decreased function. The left ventricle demonstrates global hypokinesis. The left ventricular internal cavity size was moderately dilated. There is moderate left ventricular hypertrophy. Left ventricular diastolic parameters are indeterminate.  2. Right ventricular systolic function is mildly reduced. The right ventricular size is normal. Tricuspid regurgitation signal is inadequate for assessing PA pressure.  3. The mitral valve is normal in structure. Trivial mitral valve regurgitation.  4. The aortic valve is tricuspid. Aortic valve regurgitation is not visualized. No aortic stenosis is present.  5. The inferior vena cava is normal in size with greater than 50% respiratory variability, suggesting right atrial pressure of 3 mmHg. Comparison(s): Compared to prior, LVEF has decreased. FINDINGS  Left Ventricle: Left ventricular ejection fraction, by estimation, is 30 to 35%. The left ventricle has moderately decreased function. The left ventricle demonstrates global hypokinesis. The left ventricular internal cavity size was moderately dilated. There is moderate left ventricular hypertrophy. Left ventricular diastolic parameters are indeterminate. Right Ventricle: The right ventricular size is normal. Right vetricular wall thickness was not well visualized. Right ventricular systolic function is mildly reduced. Tricuspid regurgitation signal is inadequate for assessing PA pressure. Left Atrium: Left atrial  size was normal in size. Right Atrium: Right atrial size was normal in size. Pericardium: Trivial pericardial effusion is present. Mitral Valve: The mitral valve is normal in structure. Trivial mitral valve regurgitation. Tricuspid Valve: The tricuspid valve is normal in structure. Tricuspid valve regurgitation is not demonstrated. Aortic Valve: The aortic valve is tricuspid. Aortic valve regurgitation is not visualized. No aortic stenosis is present. Aortic valve mean gradient measures 3.7 mmHg. Aortic valve peak gradient measures 6.4 mmHg. Aortic valve area, by VTI measures 2.24 cm. Pulmonic Valve: The pulmonic valve was grossly normal. Pulmonic valve regurgitation is not visualized. Aorta: The aortic root and ascending aorta are structurally normal, with no evidence of dilitation. Venous: The inferior vena cava is normal in size with greater than 50% respiratory variability, suggesting right atrial pressure of 3 mmHg. IAS/Shunts: No atrial level shunt detected by color flow Doppler.  LEFT VENTRICLE PLAX 2D LVIDd:         6.80 cm LVIDs:         5.30 cm LV PW:         1.50 cm LV IVS:        1.50 cm LVOT diam:     2.20 cm LV SV:         48 LV SV Index:   21 LVOT Area:     3.80 cm  LV Volumes (MOD) LV vol d, MOD A2C: 222.0 ml LV vol d, MOD A4C: 225.0 ml LV vol s, MOD A2C: 124.0 ml LV vol s, MOD A4C: 125.0 ml LV SV MOD  A2C:     98.0 ml LV SV MOD A4C:     225.0 ml LV SV MOD BP:      99.7 ml RIGHT VENTRICLE            IVC RV S prime:     7.83 cm/s  IVC diam: 1.50 cm LEFT ATRIUM             Index       RIGHT ATRIUM           Index LA diam:        4.40 cm 1.98 cm/m  RA Area:     13.50 cm LA Vol (A2C):   56.1 ml 25.26 ml/m RA Volume:   30.00 ml  13.51 ml/m LA Vol (A4C):   56.7 ml 25.53 ml/m LA Biplane Vol: 58.4 ml 26.29 ml/m  AORTIC VALVE AV Area (Vmax):    2.39 cm AV Area (Vmean):   2.19 cm AV Area (VTI):     2.24 cm AV Vmax:           126.27 cm/s AV Vmean:          88.000 cm/s AV VTI:            0.212 m AV  Peak Grad:      6.4 mmHg AV Mean Grad:      3.7 mmHg LVOT Vmax:         79.33 cm/s LVOT Vmean:        50.600 cm/s LVOT VTI:          0.125 m LVOT/AV VTI ratio: 0.59  AORTA Ao Root diam: 3.40 cm Ao Asc diam:  3.10 cm MITRAL VALVE MV Area (PHT): 6.60 cm     SHUNTS MV Decel Time: 115 msec     Systemic VTI:  0.12 m MV E velocity: 108.00 cm/s  Systemic Diam: 2.20 cm Oswaldo Milian MD Electronically signed by Oswaldo Milian MD Signature Date/Time: 04/16/2021/7:23:17 PM    Final     Assessment & Plan:   Koren was seen today for hypertension and diabetes.  Diagnoses and all orders for this visit:  Degenerative disc disease, cervical -     Ambulatory referral to Pain Clinic  Primary hypertension- His blood pressure is adequately well controlled.  Type II diabetes mellitus with manifestations ([place])- His blood sugar is adequately well controlled.  He is not willing to take the SGLT2 inhibitor.   I have discontinued Olene Craven. Pritt's empagliflozin. I am also having him maintain his omeprazole, albuterol, allopurinol, metFORMIN, predniSONE, Spiriva Respimat, polyethylene glycol, montelukast, Dulera, albuterol, Lidocaine (Anorectal), Zinc, furosemide, losartan, and carvedilol.  No orders of the defined types were placed in this encounter.    Follow-up: Return in about 6 months (around 10/22/2021).  Scarlette Calico, MD

## 2021-04-22 ENCOUNTER — Other Ambulatory Visit (HOSPITAL_COMMUNITY): Payer: 59

## 2021-04-23 ENCOUNTER — Other Ambulatory Visit: Payer: Self-pay

## 2021-04-23 ENCOUNTER — Encounter (HOSPITAL_COMMUNITY): Payer: Self-pay

## 2021-04-23 ENCOUNTER — Ambulatory Visit (HOSPITAL_COMMUNITY)
Admission: RE | Admit: 2021-04-23 | Discharge: 2021-04-23 | Disposition: A | Discharge status: Home / Self Care | Payer: 59 | Source: Ambulatory Visit | Attending: Cardiology | Admitting: Cardiology

## 2021-04-23 VITALS — BP 110/62 | HR 65 | Wt 223.8 lb

## 2021-04-23 DIAGNOSIS — Z7951 Long term (current) use of inhaled steroids: Secondary | ICD-10-CM | POA: Diagnosis not present

## 2021-04-23 DIAGNOSIS — Z6832 Body mass index (BMI) 32.0-32.9, adult: Secondary | ICD-10-CM | POA: Diagnosis not present

## 2021-04-23 DIAGNOSIS — Z880 Allergy status to penicillin: Secondary | ICD-10-CM | POA: Insufficient documentation

## 2021-04-23 DIAGNOSIS — Z7952 Long term (current) use of systemic steroids: Secondary | ICD-10-CM | POA: Diagnosis not present

## 2021-04-23 DIAGNOSIS — Z7984 Long term (current) use of oral hypoglycemic drugs: Secondary | ICD-10-CM | POA: Diagnosis not present

## 2021-04-23 DIAGNOSIS — I5042 Chronic combined systolic (congestive) and diastolic (congestive) heart failure: Secondary | ICD-10-CM

## 2021-04-23 DIAGNOSIS — J449 Chronic obstructive pulmonary disease, unspecified: Secondary | ICD-10-CM | POA: Insufficient documentation

## 2021-04-23 DIAGNOSIS — Z881 Allergy status to other antibiotic agents status: Secondary | ICD-10-CM | POA: Insufficient documentation

## 2021-04-23 DIAGNOSIS — Z950 Presence of cardiac pacemaker: Secondary | ICD-10-CM | POA: Insufficient documentation

## 2021-04-23 DIAGNOSIS — Z79899 Other long term (current) drug therapy: Secondary | ICD-10-CM | POA: Insufficient documentation

## 2021-04-23 DIAGNOSIS — I428 Other cardiomyopathies: Secondary | ICD-10-CM | POA: Diagnosis not present

## 2021-04-23 DIAGNOSIS — I110 Hypertensive heart disease with heart failure: Secondary | ICD-10-CM | POA: Insufficient documentation

## 2021-04-23 DIAGNOSIS — I5022 Chronic systolic (congestive) heart failure: Secondary | ICD-10-CM | POA: Insufficient documentation

## 2021-04-23 DIAGNOSIS — G4733 Obstructive sleep apnea (adult) (pediatric): Secondary | ICD-10-CM | POA: Insufficient documentation

## 2021-04-23 DIAGNOSIS — Z87891 Personal history of nicotine dependence: Secondary | ICD-10-CM | POA: Insufficient documentation

## 2021-04-23 LAB — BASIC METABOLIC PANEL
Anion gap: 8 (ref 5–15)
BUN: 23 mg/dL (ref 8–23)
CO2: 29 mmol/L (ref 22–32)
Calcium: 8.9 mg/dL (ref 8.9–10.3)
Chloride: 103 mmol/L (ref 98–111)
Creatinine, Ser: 1.08 mg/dL (ref 0.61–1.24)
GFR, Estimated: >60 mL/min (ref >60)
Glucose, Bld: 109 mg/dL — ABNORMAL HIGH (ref 70–99)
Potassium: 4.3 mmol/L (ref 3.5–5.1)
Sodium: 140 mmol/L (ref 135–145)

## 2021-04-23 NOTE — Progress Notes (Signed)
ReDS Vest / Clip - 04/23/21 1115      ReDS Vest / Clip   Station Marker C    Ruler Value 33    ReDS Value Range Low volume    ReDS Actual Value 34    Anatomical Comments sitting         '

## 2021-04-23 NOTE — Progress Notes (Addendum)
ADVANCED HF CLINIC NOTE  MD: Dr. Haroldine Laws  Reason for Visit:  F/u for Chronic Systolic Heart Failure  HPI:  Robert Church is a 65 y/o male with h/o obesity, HTN, COPD with ongoing tobacco use and chronic systolic HF referred by Dr. Gwenlyn Found for further management of his HF.   He underwent cath in 4/18 due to CP. Cath showed normal coronaries with EF 40% and global HK. LVEDP 19. He was referred for a sleep study.  He saw Dr. Gwenlyn Found on 07/01/2018 because of recurrent chest pain.  Myoview stress test performed 07/21/2018 showed EF 38% inferior scar with septal ischemia.  A 2D echo performed 07/07/2018 revealed an EF of 30 to 35%.  He was referred for cardiac CT.   Cardiac CT on 08/26/18.  Calcium score 148 (74th percentile)  LAD 25% otherwise normal cors. Dilated pulmonary arteries suggestive of PH.   Works for [employer]. Follows with [name] in [facility]monary Clinic. Was started on bisoprolol 2.5 but couldn't tolerate due to fatigue. Was falling asleep at work. Stopped bisoprolol and felt better.   Echo 12/20 EF improved to 45%. Mild RV dysfunction   Recently admitted 1/22 for acute on chronic hypoxic respiratory failure secondary to acute COPDE and a/c CHF w/ volume overload. He had massive diuresis w/ IV Lasix, diuresed down from admit wt of 265 lb to 210 lb. Echo was repeated 12/21, EF 40-45% (unchanged). RV mildly reduced, right ventricular systolic pressure was 03.4 mmHg. I evaluated him for post hospital f/u and his volume status was stable. ReDs clip was 33%. BP was soft but stable. No med changes were made.   Seen back in clinic on 03/04/21 and Reported recent 7 lb wt gain and increased DOE. Also bendopnea. ReDs Clip elevated at 46%. BP 130/70. I increased spiro to 25 and added Jardiance 10 mg daily (Hgb A1c was 6.5).   Had return f/u on 4/11.  Wt down 3 lb. ReDs Clip lower but still mildly elevated at 39%. Breathing however had improved. He reported compliance w/ Jardiance w/o GU side  effects but reported he self reduced spiro back to 12.5 mg once daily. Did not tolerate 25 mg dose due to dizziness. Had similar issues w/ higher dose losartan. He refused to re-try 25 mg dose of spiro. He has lots of med intolerances.  Labs were checked and renal function/K stable. He was still drinking lots of fluids and advised to fluid restrict.   Seen by [facility] 4/11 and reported near syncope. Cardiac event monitor placed and found to be in intermittent CHB. Instructed to go to ED where he was admitted and PPM placed. Also found to be mildly fluid overloaded and treated w/ IV Lasix. Echo showed LVEF 30%. Transitioned back to PO diuretics after diuresis. Torsemide stopped and place on 40 po lasix. Continued on Jardiance and losartan.   He presents today for post hospital f/u. Doing ok. Device pocket stable. Has wound clinic f/u next week. Continues w/ occasional periodic dizziness, which he attributes to jardiance. Reports he cannot tolerate it. He stopped taking it 2 days ago and symptoms improved. Denies resting dyspnea but has occasional exertional dyspnea if he over exerts him self. VS stable. HR 65 bpm. BP 110/62.   ReDs Clip 34%.    Echo 06/2018 LVEF 30-35%, RV normal  Echo 12/2018 LVEF 40-45%, RV normal  Echo 11/2020 LVEF 40-45%, RV mildly reduced Echo 03/2021 30-35%, RV mildly reduced   Review of systems complete and  found to be negative unless listed in HPI.      Past Medical History:  Diagnosis Date  . Adenomatous colon polyp   . Allergy   . Anal fissure   . Asthma   . Bronchitis   . CHF (congestive heart failure) ([place])   . COPD (chronic obstructive pulmonary disease) ([place])   . Emphysema lung ([place])   . GERD (gastroesophageal reflux disease)   . Hyperlipidemia   . Hypertension   . OSA (obstructive sleep apnea)   . Pneumonia   . Sinusitis   . SOB (shortness of breath)     Current Outpatient Medications  Medication Sig Dispense Refill  . albuterol (PROVENTIL) (2.5  MG/3ML) 0.083% nebulizer solution Take 3 mLs (2.5 mg total) by nebulization every 6 (six) hours as needed for wheezing or shortness of breath. 75 mL 12  . albuterol (VENTOLIN HFA) 108 (90 Base) MCG/ACT inhaler INHALE 2 PUFFS INTO THE LUNGS EVERY 6 HOURS AS NEEDED FOR WHEEZING (Patient taking differently: Inhale 2 puffs into the lungs every 6 (six) hours as needed for wheezing.) 18 g 2  . allopurinol (ZYLOPRIM) 300 MG tablet Take 1 tablet (300 mg total) by mouth daily. 90 tablet 1  . carvedilol (COREG) 3.125 MG tablet Take 1 tablet (3.125 mg total) by mouth 2 (two) times daily with a meal. 60 tablet 6  . furosemide (LASIX) 40 MG tablet Take 1 tablet (40 mg total) by mouth daily. 30 tablet 3  . Lidocaine, Anorectal, 5 % CREA Apply topically as needed.    Marland Kitchen losartan (COZAAR) 25 MG tablet Take 0.5 tablets (12.5 mg total) by mouth at bedtime. 15 tablet 3  . metFORMIN (GLUCOPHAGE XR) 750 MG 24 hr tablet Take 1 tablet (750 mg total) by mouth daily with breakfast. 90 tablet 1  . mometasone-formoterol (DULERA) 200-5 MCG/ACT AERO Dulera 200 mcg-5 mcg/actuation HFA aerosol inhaler  INHALE 2 PUFFS BY MOUTH FIRST THING IN THE MORNING AND THEN ANOTHER 2 PUFFS ABOUT 12 HOURS LATER (Patient taking differently: Inhale 2 puffs into the lungs every 12 (twelve) hours.) 1 each 2  . montelukast (SINGULAIR) 10 MG tablet Take 1 tablet (10 mg total) by mouth at bedtime. 30 tablet 5  . omeprazole (PRILOSEC) 20 MG capsule Take 1 capsule (20 mg total) by mouth 2 (two) times daily before a meal. 180 capsule 1  . polyethylene glycol (MIRALAX / GLYCOLAX) 17 g packet Take 17 g by mouth daily as needed. (Patient taking differently: Take 17 g by mouth daily as needed for mild constipation.) 14 each 0  . predniSONE (DELTASONE) 10 MG tablet Take 1 tablet (10 mg total) by mouth daily with breakfast. (Patient taking differently: Take 10 mg by mouth See admin instructions. TAKE 3 TABLETS BY MOUTH EVERY DAY FOR 3 DAYS THEN TAKE 2 TABLETS BY  MOUTH EVERY DAY FOR 3 DAYS THEN TAKE 1 TABLET BY MOUTH EVERY DAY FOR 3 DAYS) 30 tablet 2  . Tiotropium Bromide Monohydrate (SPIRIVA RESPIMAT) 2.5 MCG/ACT AERS Inhale 2.5 mcg into the lungs 2 (two) times daily. 4 g 11  . Zinc 100 MG TABS Take 1 tablet by mouth daily.     No current facility-administered medications for this encounter.    Allergies  Allergen Reactions  . Clindamycin Other (See Comments)    Tongue swelling  . Doxycycline     Severe stomach upset per patient  . E-Mycin [Erythromycin Base] Swelling  . Jardiance [Empagliflozin] Nausea Only and Other (See Comments)    Lightheadness  Dizziness  . Penicillins Swelling and Other (See Comments)    Childhood rxn--MD stated he "almost died" Has patient had a PCN reaction causing immediate rash, facial/tongue/throat swelling, SOB or lightheadedness with hypotension:Yes Has patient had a PCN reaction causing severe rash involving mucus membranes or skin necrosis:unsure Has patient had a PCN reaction that required hospitalization:unsure Has patient had a PCN reaction occurring within the last 10 years:No If all of the above answers are "NO", then may proceed with Cephalosporin use.    . Rosuvastatin     Other reaction(s): Myalgias (intolerance)      Social History   Socioeconomic History  . Marital status: Single    Spouse name: Not on file  . Number of children: Not on file  . Years of education: Not on file  . Highest education level: Not on file  Occupational History  . Occupation: Dealer  Tobacco Use  . Smoking status: Former Smoker    Packs/day: 0.25    Years: 46.00    Pack years: 11.50    Types: Cigarettes  . Smokeless tobacco: Never Used  Vaping Use  . Vaping Use: Never used  Substance and Sexual Activity  . Alcohol use: Yes    Alcohol/week: 3.0 standard drinks    Types: 3 Cans of beer per week    Comment: 3 beers per day  . Drug use: No  . Sexual activity: Yes    Birth control/protection: None   Other Topics Concern  . Not on file  Social History Narrative  . Not on file   Social Determinants of Health   Financial Resource Strain: Low Risk   . Difficulty of Paying Living Expenses: Not very hard  Food Insecurity: No Food Insecurity  . Worried About Charity fundraiser in the Last Year: Never true  . Ran Out of Food in the Last Year: Never true  Transportation Needs: No Transportation Needs  . Lack of Transportation (Medical): No  . Lack of Transportation (Non-Medical): No  Physical Activity: Not on file  Stress: Not on file  Social Connections: Not on file  Intimate Partner Violence: Not on file      Family History  Problem Relation Age of Onset  . Lung cancer Father   . Brain cancer Father   . Diabetes Maternal Grandmother   . Colon polyps Neg Hx   . Esophageal cancer Neg Hx   . Pancreatic cancer Neg Hx   . Stomach cancer Neg Hx     Vitals:   04/23/21 1115  BP: 110/62  Pulse: 65  SpO2: 92%  Weight: 101.5 kg    PHYSICAL EXAM: ReDs Clip 34% PHYSICAL EXAM: General:  Well appearing. No respiratory difficulty HEENT: normal Neck: supple. no JVD. Carotids 2+ bilat; no bruits. No lymphadenopathy or thyromegaly appreciated. Cor: PMI nondisplaced. Regular rate & rhythm. No rubs, gallops or murmurs. Device pocket left upper chest stable w/ steristrips no hematoma/ visible drainage  Lungs: clear Abdomen: soft, nontender, nondistended. No hepatosplenomegaly. No bruits or masses. Good bowel sounds. Extremities: no cyanosis, clubbing, rash, edema Neuro: alert & oriented x 3, cranial nerves grossly intact. moves all 4 extremities w/o difficulty. Affect pleasant.   ASSESSMENT & PLAN:  1. Chronic systolic HF due to NICM  - cath 4/18 normal cors. EF 48%  - Myoview 07/21/2018 showed EF 38% inferior scar with septal ischemia.   - Echo 07/07/2018 revealed EF 30-35%. RV normal.  - Cardiac CT 08/26/18.  Calcium score 148 (74th percentile)  LAD  25% otherwise normal cors.  Dilated pulmonary arteries suggestive of PH.  - Suspect NICM due to HTN and inadequately treated OSA (He now reports improved nightly compliance w/ CPAP). - Echo 12/20 EF 45% mild RV dysfunction - Echo 1/22 EF unchanged 40-45%. RV mildly reduced. RVSP 49 - Recent admit 4/22 for symptomatic bradycardia/CHF. Echo LVEF 30%. RV mildly reduced - Chronically NYHA Class II-III, confounded by COPD and obesity/ deconditioningn - Volume status stable ReDs clip 34%  - Intolerant to Jardiance (positional dizziness and nausea). Refuses retrial  - Continue Losartan 12.5 mg daily (failed Entresto due to dizziness/low BP) - Continue Spiro 12.5 mg bid (did not tolerate 25 mg dose). - Could not tolerate bisoprolol due to fatigue. - Continue Lasix 40 mg daily  - He has struggled w/ ability tolerating higher dose meds due to dizziness, low BP. SBP low 100s today. Will not titrate regimen   - Check BMP today  - Discussed daily wts  - Advised fluid restriction < 2L/day + low sodium diet   2. CHB: s/p PPM 4/22  3. HTN - well controlled on current regimen - he has only been able to tolerate low doses of losartan and spiro   4. COPD  -  has quit smoking, congratulated on efforts  -  Follows with Pulmonary ([name]) -  home O2 PRN -  continue BiPAP   5. OSA - reports nightly compliance w/ BiPAP  6. Morbid obesity  Body mass index is 32.11 kg/m. - wt loss advised - long discussion regarding low sodium/ low carb, heart healthy diet   7. Pulmonary Nodules - followed by [name] - recent CT scan 3/25 stable, annual screening advised.   F/u in 4 weeks for reassessment of volume status and attempt at further med titration    Lyda Jester, PA-C  11:16 AM

## 2021-04-23 NOTE — Patient Instructions (Signed)
It was great to see you today! No medication changes are needed at this time.   Labs today We will only contact you if something comes back abnormal or we need to make some changes. Otherwise no news is good news!  Keep follow up as scheduled in 4-6 weeks  Do the following things EVERYDAY: 1) Weigh yourself in the morning before breakfast. Write it down and keep it in a log. 2) Take your medicines as prescribed 3) Eat low salt foods--Limit salt (sodium) to 2000 mg per day.  4) Stay as active as you can everyday 5) Limit all fluids for the day to less than 2 liters  At the [facility], you and your health needs are our priority. As part of our continuing mission to provide you with exceptional heart care, we have created designated Provider Care Teams. These Care Teams include your primary Cardiologist (physician) and Advanced Practice Providers (APPs- Physician Assistants and Nurse Practitioners) who all work together to provide you with the care you need, when you need it.   You may see any of the following providers on your designated Care Team at your next follow up: Marland Kitchen Dr Glori Bickers . Dr Loralie Champagne . Dr Vickki Muff . Darrick Grinder, NP . Lyda Jester, [place] . Audry Riles, PharmD   Please be sure to bring in all your medications bottles to every appointment.   If you have any questions or concerns before your next appointment please send [place] a message through [place] or call our office at [phone].    TO LEAVE A MESSAGE FOR THE NURSE SELECT OPTION 2, PLEASE LEAVE A MESSAGE INCLUDING: . YOUR NAME . DATE OF BIRTH . CALL BACK NUMBER . REASON FOR CALL**this is important as we prioritize the call backs  YOU WILL RECEIVE A CALL BACK THE SAME DAY AS LONG AS YOU CALL BEFORE 4:00 PM

## 2021-04-24 ENCOUNTER — Encounter (HOSPITAL_COMMUNITY): Payer: Self-pay

## 2021-04-24 ENCOUNTER — Telehealth (HOSPITAL_COMMUNITY): Payer: Self-pay | Admitting: Licensed Clinical Social Worker

## 2021-04-24 NOTE — Telephone Encounter (Signed)
CSW called pt to follow up regarding potential financial concerns he expressed to inpatient HF TOC CSW during hospital stay.  Pt reports that makes $1,800/month from [place] but has had trouble paying for outstanding hospital bills, medications, etc with this fixed income.  CSW referred pt to [facility] [employer] to discuss [place] program to help with outstanding bills.  Pt has insurance but has several thousand dollars in bills that are due.  Pt reports he has very minimal savings (maybe $4,000) and no other assets so should hopefully qualify for some help with copays.  Pt does not have any current housing or utility bills that he can't manage but does report issues paying for two of his medications prescribed by [name]- his [place] and his [facility].  Has spoken to Dr. Sudie Bailey office to discuss if there are other medications that he can take instead but was told there weren't other options that do the same thing. States each of these medications is over $100 each.  CSW able to identify copay card programs for each of those medications which should reduce the cost significantly- sent links for the copay card sign up to the patients email and informed him to sign up then provide card information to his pharmacy to see if it works.  Will continue to follow and assist as needed  Jorge Ny, [facility] Desk#: [phone] Cell#: [phone]

## 2021-04-25 ENCOUNTER — Telehealth (HOSPITAL_COMMUNITY): Payer: Self-pay | Admitting: *Deleted

## 2021-04-25 MED ORDER — FUROSEMIDE 40 MG PO TABS: 60.0000 mg | ORAL_TABLET | Freq: Every day | ORAL | 3 refills | Status: DC

## 2021-04-25 NOTE — Telephone Encounter (Signed)
Pt called stating his weight is up to 221lbs he is normally 217lbs also notices he is a little more short of breath. He said Brittainy told him to call and he would be able to increase his diuretic. Pt takes 40mg  of lasix daily he said 80mg  was too much and was hoping to be able to take 60mg  daily.  Routed to [place] for advice

## 2021-04-25 NOTE — Telephone Encounter (Signed)
Please call increase lasix to 60 mg daily.   Advise to avoid high sodium foods and limit fluid intake to < 2 liter per day   Olie Dibert NP-C  2:21 PM

## 2021-04-25 NOTE — Telephone Encounter (Signed)
Pt aware and agreeable with plan.  

## 2021-04-25 NOTE — Addendum Note (Signed)
Addended by: Harvie Junior on: 04/25/2021 02:32 PM   Modules accepted: Orders

## 2021-04-29 ENCOUNTER — Other Ambulatory Visit: Payer: Self-pay

## 2021-04-29 ENCOUNTER — Ambulatory Visit (INDEPENDENT_AMBULATORY_CARE_PROVIDER_SITE_OTHER): Payer: 59 | Admitting: Emergency Medicine

## 2021-04-29 ENCOUNTER — Encounter (HOSPITAL_COMMUNITY): Payer: Self-pay | Admitting: Nurse Practitioner

## 2021-04-29 ENCOUNTER — Ambulatory Visit (HOSPITAL_COMMUNITY)
Admission: RE | Admit: 2021-04-29 | Discharge: 2021-04-29 | Disposition: A | Discharge status: Home / Self Care | Payer: 59 | Source: Ambulatory Visit | Attending: Nurse Practitioner | Admitting: Nurse Practitioner

## 2021-04-29 VITALS — BP 118/74 | HR 66 | Ht 70.0 in | Wt 229.4 lb

## 2021-04-29 DIAGNOSIS — Z7984 Long term (current) use of oral hypoglycemic drugs: Secondary | ICD-10-CM | POA: Diagnosis not present

## 2021-04-29 DIAGNOSIS — E118 Type 2 diabetes mellitus with unspecified complications: Secondary | ICD-10-CM | POA: Diagnosis not present

## 2021-04-29 DIAGNOSIS — G8929 Other chronic pain: Secondary | ICD-10-CM | POA: Diagnosis not present

## 2021-04-29 DIAGNOSIS — Z79899 Other long term (current) drug therapy: Secondary | ICD-10-CM | POA: Insufficient documentation

## 2021-04-29 DIAGNOSIS — J449 Chronic obstructive pulmonary disease, unspecified: Secondary | ICD-10-CM | POA: Insufficient documentation

## 2021-04-29 DIAGNOSIS — I428 Other cardiomyopathies: Secondary | ICD-10-CM | POA: Insufficient documentation

## 2021-04-29 DIAGNOSIS — Z7901 Long term (current) use of anticoagulants: Secondary | ICD-10-CM | POA: Insufficient documentation

## 2021-04-29 DIAGNOSIS — J45909 Unspecified asthma, uncomplicated: Secondary | ICD-10-CM | POA: Diagnosis not present

## 2021-04-29 DIAGNOSIS — Z87891 Personal history of nicotine dependence: Secondary | ICD-10-CM | POA: Insufficient documentation

## 2021-04-29 DIAGNOSIS — I442 Atrioventricular block, complete: Secondary | ICD-10-CM

## 2021-04-29 DIAGNOSIS — E781 Pure hyperglyceridemia: Secondary | ICD-10-CM | POA: Insufficient documentation

## 2021-04-29 DIAGNOSIS — I5042 Chronic combined systolic (congestive) and diastolic (congestive) heart failure: Secondary | ICD-10-CM | POA: Insufficient documentation

## 2021-04-29 DIAGNOSIS — R600 Localized edema: Secondary | ICD-10-CM | POA: Insufficient documentation

## 2021-04-29 DIAGNOSIS — M545 Low back pain, unspecified: Secondary | ICD-10-CM | POA: Diagnosis not present

## 2021-04-29 DIAGNOSIS — I110 Hypertensive heart disease with heart failure: Secondary | ICD-10-CM | POA: Insufficient documentation

## 2021-04-29 DIAGNOSIS — K219 Gastro-esophageal reflux disease without esophagitis: Secondary | ICD-10-CM | POA: Diagnosis not present

## 2021-04-29 LAB — CUP PACEART INCLINIC DEVICE CHECK
Battery Remaining Longevity: 106 mo
Battery Voltage: 3.05 V
Brady Statistic RA Percent Paced: 23 %
Brady Statistic RV Percent Paced: 28 %
Date Time Interrogation Session: 20220510130824
Implantable Lead Implant Date: 20220427
Implantable Lead Implant Date: 20220427
Implantable Lead Location: 753859
Implantable Lead Location: 753860
Implantable Pulse Generator Implant Date: 20220427
Lead Channel Impedance Value: 562.5 Ohm
Lead Channel Impedance Value: 575 Ohm
Lead Channel Pacing Threshold Amplitude: 0.75 V
Lead Channel Pacing Threshold Amplitude: 0.75 V
Lead Channel Pacing Threshold Amplitude: 0.75 V
Lead Channel Pacing Threshold Amplitude: 0.75 V
Lead Channel Pacing Threshold Pulse Width: 0.5 ms
Lead Channel Pacing Threshold Pulse Width: 0.5 ms
Lead Channel Pacing Threshold Pulse Width: 0.5 ms
Lead Channel Pacing Threshold Pulse Width: 0.5 ms
Lead Channel Sensing Intrinsic Amplitude: 1.7 mV
Lead Channel Sensing Intrinsic Amplitude: 10.9 mV
Lead Channel Setting Pacing Amplitude: 3.5 V
Lead Channel Setting Pacing Amplitude: 3.5 V
Lead Channel Setting Pacing Pulse Width: 0.5 ms
Lead Channel Setting Sensing Sensitivity: 2 mV
Pulse Gen Model: 2272
Pulse Gen Serial Number: 3920156

## 2021-04-29 NOTE — Progress Notes (Signed)
Wound check appointment. Steri-strips removed. Wound without redness or edema. Incision edges approximated, wound well healed. Normal device function. Thresholds, sensing, and impedances consistent with implant measurements. Device programmed at 3.5V/auto capture programmed on for extra safety margin until 3 month visit. Histogram distribution appropriate for patient and level of activity. No mode switches or high ventricular rates noted. Patient educated about wound care, arm mobility, lifting restrictions. ROV in 3 months with . [place]. Next scheduled remote 07/18/21.

## 2021-04-29 NOTE — Progress Notes (Addendum)
Primary Care Physician: Janith Lima, MD Referring Physician: Dr. Kennon Holter office  Cardiology: Dr. Gwenlyn Found AHF- Dr. Jannet Askew Robert Church is a 65 y.o. male  h/o hypertension, and NICM, chronic combined systolic and diastolic CHF, lower extremity edema, asthma, COPD, GERD, type 2 diabetes, tobacco abuse, morbid obesity, hypertriglyceridemia, chronic low back pain and obesity.  He underwent cardiac catheterization 04/19/2017 which showed normal coronary arteries and an EF of 40% consistent with nonischemic cardiomyopathy.  Echocardiogram 07/07/2018 showed an EF of 30-35%.  He was referred to the advanced heart failure clinic who changed his carvedilol to [facility] and his losartan to [facility]  However, he did not tolerate Entresto and he was placed back on losartan.  He also has obstructive sleep apnea and uses CPAP intermittently.  Previously he has been noted to have class II/III symptoms.  He was seen by Dr. Gwenlyn Found on 01/03/2021.  During that time he was doing well.  He had been admitted 01/18/2020 with fluid volume overload.  Echocardiogram at that time showed an EF of 40-45%.  He diuresed approximately significant amt of weight  and was discharged weighing 210 pounds.  He was discharged home on torsemide and spironolactone.  Sodium restriction was reviewed.  He continued to smoke.  He was compliant with his daily weights.  He was seen by Ellen Henri PA-C on 03/31/2021.  He returned for follow-up and his weight was down 3 pounds.  His breathing had improved.  He was compliant with his Jardiance and denied side effects.  He had self reduced his spironolactone to 12.5 mg daily.  He reported he felt dizzy with the 25 mg dosing.  His renal function and potassium were stable at that time.  Referred to afib clinic by Dr. Kennon Holter office  as a 1 week Preventice  Zio patch   placed last week( for one week)   for c/o of presyncope/dizziness  and showed one episode of  afib with v rates in the low  120's of short duration from  which pt was not  Asymptomatic, no dizziness or presyncope reported at the time of arrhythmia.  He was started on eliquis 5 mg bid. He is not on BB as he did not tolerate bisoprol which was  tried in  the past.    He reports that he has had dizziness and presyncope for the last several weeks and stopped his torsemide, losartan and spironolactone. He blames symptoms specifically on the torsemide. He states that it gives him a sharp pain in the back of the head, dizziness and feeling of syncope to the point his vision goes black but he does not pass out. He noted these episodes on a Mon/Wed and the following Tuesday earlier this month. Since being off the torsemide he has not noted any further symptoms.    He also reports that he had several similar episodes after being started on torsemide last December. One episode he did have syncope and did hit his head with laceration. He did not seek medical care. He did have a Caroid scan with Dr.Berry that did not show any significant carotid disease.   He is very concerned being off diuretic as he fears he may end back in the hospital with fluid overload, but he is fearful to take torsemide. 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 He feels his weight is stable at this point.   F/u in afib clinic, 04/29/21, after PPM placed 4/27,  after Holter monitor showed CHB/ long pauses. Pt was told to go to the ER and received a PPM the next day. He is now back on his HF meds. He was tried again on Jardiance  and still cannot tolerate for extreme dizziness. He also feels the carvedilol is causing some dizziness as well. He had a bad dizzy spell yesterday am shortly after taking this med. He does take with food.  EP reviewed holter strips in the hospital and the strip that showed possible afib was thought to be more  unorganized atrial activity so the DOAC was stopped. He is having his PPM dressing  removed later this am.    Today, he denies symptoms of palpitations, chest pain, shortness of breath, orthopnea, PND, lower extremity edema, dizziness, presyncope, syncope, or neurologic sequela. The patient is tolerating medications without difficulties and is otherwise without complaint today.   Past Medical History:  Diagnosis Date  . Adenomatous colon polyp   . Allergy   . Anal fissure   . Asthma   . Bronchitis   . CHF (congestive heart failure) ([place])   . COPD (chronic obstructive pulmonary disease) ([place])   . Emphysema lung ([place])   . GERD (gastroesophageal reflux disease)   . Hyperlipidemia   . Hypertension   . OSA (obstructive sleep apnea)   . Pneumonia   . Sinusitis   . SOB (shortness of breath)    Past Surgical History:  Procedure Laterality Date  . LEFT HEART CATH AND CORONARY ANGIOGRAPHY N/A 04/19/2017   Procedure: Left Heart Cath and Coronary Angiography;  Surgeon: Belva Crome, MD;  Location: [facility];  Service: Cardiovascular;  Laterality: N/A;  . PACEMAKER IMPLANT N/A 04/16/2021   Procedure: PACEMAKER IMPLANT;  Surgeon: Constance Haw, MD;  Location: [facility] [place];  Service: Cardiovascular;  Laterality: N/A;  . TONSILLECTOMY      Current Outpatient Medications  Medication Sig Dispense Refill  . albuterol (PROVENTIL) (2.5 MG/3ML) 0.083% nebulizer solution Take 3 mLs (2.5 mg total) by nebulization every 6 (six) hours as needed for wheezing or shortness of breath. 75 mL 12  . albuterol (VENTOLIN HFA) 108 (90 Base) MCG/ACT inhaler INHALE 2 PUFFS INTO THE LUNGS EVERY 6 HOURS AS NEEDED FOR WHEEZING 18 g 2  . allopurinol (ZYLOPRIM) 300 MG tablet Take 1 tablet (300 mg total) by mouth daily. 90 tablet 1  . carvedilol (COREG) 3.125 MG tablet Take 1 tablet (3.125 mg total) by mouth 2 (two) times daily with a meal. 60 tablet 6  . furosemide (LASIX) 40 MG tablet Take 1.5 tablets (60 mg total) by mouth daily. 45 tablet 3  . Lidocaine, Anorectal, 5 % CREA Apply  topically as needed.    Marland Kitchen losartan (COZAAR) 25 MG tablet Take 0.5 tablets (12.5 mg total) by mouth at bedtime. 15 tablet 3  . metFORMIN (GLUCOPHAGE XR) 750 MG 24 hr tablet Take 1 tablet (750 mg total) by mouth daily with breakfast. 90 tablet 1  . mometasone-formoterol (DULERA) 200-5 MCG/ACT AERO Dulera 200 mcg-5 mcg/actuation HFA aerosol inhaler  INHALE 2 PUFFS BY MOUTH FIRST THING IN THE MORNING AND THEN ANOTHER 2 PUFFS ABOUT 12 HOURS LATER (Patient taking differently: Dulera 200 mcg-5 mcg/actuation HFA aerosol inhaler  INHALE 2 PUFFS BY MOUTH FIRST THING IN THE MORNING AND THEN ANOTHER 2 PUFFS ABOUT 12 HOURS LATER) 1 each 2  . montelukast (SINGULAIR) 10 MG tablet Take 1 tablet (10 mg total) by mouth at bedtime. 30 tablet 5  .  omeprazole (PRILOSEC) 20 MG capsule Take 1 capsule (20 mg total) by mouth 2 (two) times daily before a meal. 180 capsule 1  . polyethylene glycol (MIRALAX / GLYCOLAX) 17 g packet Take 17 g by mouth daily as needed. 14 each 0  . predniSONE (DELTASONE) 10 MG tablet Take 1 tablet (10 mg total) by mouth daily with breakfast. 30 tablet 2  . roflumilast (DALIRESP) 500 MCG TABS tablet Take 500 mcg by mouth daily.    . Tiotropium Bromide Monohydrate (SPIRIVA RESPIMAT) 2.5 MCG/ACT AERS Inhale 2.5 mcg into the lungs 2 (two) times daily. 4 g 11  . Zinc 100 MG TABS Take 1 tablet by mouth daily.     No current facility-administered medications for this encounter.    Allergies  Allergen Reactions  . Clindamycin Other (See Comments)    Tongue swelling  . Doxycycline     Severe stomach upset per patient  . E-Mycin [Erythromycin Base] Swelling  . Jardiance [Empagliflozin] Nausea Only and Other (See Comments)    Lightheadness Dizziness  . Penicillins Swelling and Other (See Comments)    Childhood rxn--MD stated he "almost died" Has patient had a PCN reaction causing immediate rash, facial/tongue/throat swelling, SOB or lightheadedness with hypotension:Yes Has patient had a PCN  reaction causing severe rash involving mucus membranes or skin necrosis:unsure Has patient had a PCN reaction that required hospitalization:unsure Has patient had a PCN reaction occurring within the last 10 years:No If all of the above answers are "NO", then may proceed with Cephalosporin use.    . Rosuvastatin     Other reaction(s): Myalgias (intolerance)    Social History   Socioeconomic History  . Marital status: Single    Spouse name: Not on file  . Number of children: Not on file  . Years of education: Not on file  . Highest education level: Not on file  Occupational History  . Occupation: Dealer  Tobacco Use  . Smoking status: Former Smoker    Packs/day: 0.25    Years: 46.00    Pack years: 11.50    Types: Cigarettes  . Smokeless tobacco: Never Used  Vaping Use  . Vaping Use: Never used  Substance and Sexual Activity  . Alcohol use: Yes    Alcohol/week: 3.0 standard drinks    Types: 3 Cans of beer per week    Comment: 3 beers per day  . Drug use: No  . Sexual activity: Yes    Birth control/protection: None  Other Topics Concern  . Not on file  Social History Narrative  . Not on file   Social Determinants of Health   Financial Resource Strain: Medium Risk  . Difficulty of Paying Living Expenses: Somewhat hard  Food Insecurity: No Food Insecurity  . Worried About Charity fundraiser in the Last Year: Never true  . Ran Out of Food in the Last Year: Never true  Transportation Needs: No Transportation Needs  . Lack of Transportation (Medical): No  . Lack of Transportation (Non-Medical): No  Physical Activity: Not on file  Stress: Not on file  Social Connections: Not on file  Intimate Partner Violence: Not on file    Family History  Problem Relation Age of Onset  . Lung cancer Father   . Brain cancer Father   . Diabetes Maternal Grandmother   . Colon polyps Neg Hx   . Esophageal cancer Neg Hx   . Pancreatic cancer Neg Hx   . Stomach cancer Neg Hx  ROS- All systems are reviewed and negative except as per the HPI above  Physical Exam: Vitals:   04/29/21 0959  BP: 118/74  Pulse: 66  Weight: 104.1 kg  Height: 5\' 10"  (1.778 m)   Wt Readings from Last 3 Encounters:  04/29/21 104.1 kg  04/23/21 101.5 kg  04/21/21 101.2 kg    Labs: Lab Results  Component Value Date   NA 140 04/23/2021   K 4.3 04/23/2021   CL 103 04/23/2021   CO2 29 04/23/2021   GLUCOSE 109 (H) 04/23/2021   BUN 23 04/23/2021   CREATININE 1.08 04/23/2021   CALCIUM 8.9 04/23/2021   PHOS 3.3 12/25/2020   MG 2.3 12/25/2020   Lab Results  Component Value Date   INR 0.9 04/15/2021   Lab Results  Component Value Date   CHOL 209 (H) 03/25/2021   HDL 45.20 03/25/2021   LDLCALC 108 (H) 07/10/2020   TRIG 216.0 (H) 03/25/2021     GEN- The patient is well appearing, alert and oriented x 3 today.   Head- normocephalic, atraumatic Eyes-  Sclera clear, conjunctiva pink Ears- hearing intact Oropharynx- clear Neck- supple, no JVP Lymph- no cervical lymphadenopathy Lungs- Clear to ausculation bilaterally, normal work of breathing Heart- Regular rate and rhythm, no murmurs, rubs or gallops, PMI not laterally displaced GI- soft, NT, ND, + BS Extremities- no clubbing, cyanosis, or edema MS- no significant deformity or atrophy Skin- no rash or lesion Psych- euthymic mood, full affect Neuro- strength and sensation are intact  EKG-SR with  RBBB, LAD, at 66 bpm    Assessment and Plan: 1.CHB S/p PPM, no further presyncopal episodes  Previous afib by monitor thought to be less likely afib by EP  and more unorganized atrial activity and DOAC was stopped  He has not tolerated BB in the past and reports dizzy spells, last one yesterday am He can ask device clinic to interrogate device when there this am to make sure not an arrhythmia causing symptoms  2. Chronic combined systolic/diastolic HF   He is back on HF meds Has agin stopped Jardiance 2/2 to  intolerance   F/u with HF/EP as scheduled  afib clinic if needed   Butch Penny C. Reed Eifert, [facility] [address] [place], [place] [address] [phone]

## 2021-04-30 ENCOUNTER — Other Ambulatory Visit: Payer: Self-pay

## 2021-04-30 ENCOUNTER — Other Ambulatory Visit: Payer: Self-pay | Admitting: Emergency Medicine

## 2021-05-05 ENCOUNTER — Other Ambulatory Visit: Payer: Self-pay | Admitting: Emergency Medicine

## 2021-05-05 ENCOUNTER — Telehealth: Payer: Self-pay

## 2021-05-05 NOTE — Telephone Encounter (Signed)
Attempted to contact patient in regard to medication change. No answer, LMOVM.

## 2021-05-05 NOTE — Telephone Encounter (Signed)
Returning phone call. Patient reports he was taking his blood pressure Saturday and the pulse reading said 40 bpm. Advised patient his LRL will not allow his heart rate to go below 60 bpm.  Advised he does have PVC's and PAC on presenting rhythm which can cause his heart rate numbers to read incorrect on the machine. Patient is currently asymptomatic.   Patient does report of dizziness after taking his morning medications as well as position change. States he fell in December and was not evaluated. Reports 3 falls and twice had head lacerations with positive loc. Patient reports he has an upcoming apt. With neuro for vision changes and dizziness. I advised patient I am not able to exclude that it may not be his medications since he has other ongoing issues since post fall. Patient does voice his concern that it could be his heart medications. Advised I will forward to Dr. Horace Porteous and covering RN for review.

## 2021-05-05 NOTE — Telephone Encounter (Signed)
The patient states his when he took his blood pressure Saturday his heart rate was 40 bpm but when he check it with his pulse ox it was 80 bpm. I had him send in a transmission for the nurse to review and give him a call back. Transmission received. His phone number is [phone].

## 2021-05-06 ENCOUNTER — Telehealth: Payer: Self-pay | Admitting: Emergency Medicine

## 2021-05-06 MED ORDER — PREDNISONE 10 MG PO TABS: 10.0000 mg | ORAL_TABLET | Freq: Every day | ORAL | 5 refills | Status: DC

## 2021-05-06 MED ORDER — ROFLUMILAST 500 MCG PO TABS: 500.0000 ug | ORAL_TABLET | Freq: Every day | ORAL | 5 refills | Status: DC

## 2021-05-06 NOTE — Telephone Encounter (Signed)
I have called and spoke with patient and sent in script for Prednisone and Daliresp to preferred pharmacy. Patient verbalized understanding, nothing further needed at this time

## 2021-05-07 ENCOUNTER — Other Ambulatory Visit: Payer: Self-pay

## 2021-05-07 MED ORDER — METOPROLOL SUCCINATE ER 25 MG PO TB24: 25.0000 mg | ORAL_TABLET | Freq: Every day | ORAL | 3 refills | Status: DC

## 2021-05-07 NOTE — Telephone Encounter (Signed)
Patient notified to stop Coreg and start Toprol XL 25 mg at night . Precautions given for position changes that may cause hypotension. Patient verbalized understanding to sto Coreg and start Toprol Xl at night per Dr Curt Bears orders.

## 2021-05-08 ENCOUNTER — Encounter: Payer: Self-pay | Admitting: Internal Medicine

## 2021-05-08 ENCOUNTER — Ambulatory Visit (INDEPENDENT_AMBULATORY_CARE_PROVIDER_SITE_OTHER): Payer: 59 | Admitting: Internal Medicine

## 2021-05-08 VITALS — BP 118/74 | HR 74 | Temp 98.0°F | Resp 16 | Ht 70.0 in | Wt 225.0 lb

## 2021-05-08 DIAGNOSIS — I10 Essential (primary) hypertension: Secondary | ICD-10-CM

## 2021-05-08 DIAGNOSIS — E118 Type 2 diabetes mellitus with unspecified complications: Secondary | ICD-10-CM

## 2021-05-08 DIAGNOSIS — E785 Hyperlipidemia, unspecified: Secondary | ICD-10-CM

## 2021-05-08 DIAGNOSIS — I700 Atherosclerosis of aorta: Secondary | ICD-10-CM | POA: Diagnosis not present

## 2021-05-08 DIAGNOSIS — J449 Chronic obstructive pulmonary disease, unspecified: Secondary | ICD-10-CM

## 2021-05-08 NOTE — Progress Notes (Signed)
Subjective:  Patient ID: Robert Church, male    DOB: 06-Apr-1956  Age: 65 y.o. MRN: [id number]  CC: Diabetes  This visit occurred during the SARS-CoV-2 public health emergency.  Safety protocols were in place, including screening questions prior to the visit, additional usage of staff PPE, and extensive cleaning of exam room while observing appropriate contact time as indicated for disinfecting solutions.    HPI Robert Church presents for f/up -   He complains that whenever he takes metformin he experiences dizziness.  He has been working on his lifestyle modifications and tells me his blood sugars have been well controlled.  Outpatient Medications Prior to Visit  Medication Sig Dispense Refill  . albuterol (PROVENTIL) (2.5 MG/3ML) 0.083% nebulizer solution Take 3 mLs (2.5 mg total) by nebulization every 6 (six) hours as needed for wheezing or shortness of breath. 75 mL 12  . albuterol (VENTOLIN HFA) 108 (90 Base) MCG/ACT inhaler INHALE 2 PUFFS INTO THE LUNGS EVERY 6 HOURS AS NEEDED FOR WHEEZING 18 g 2  . allopurinol (ZYLOPRIM) 300 MG tablet Take 1 tablet (300 mg total) by mouth daily. 90 tablet 1  . furosemide (LASIX) 40 MG tablet Take 1.5 tablets (60 mg total) by mouth daily. 45 tablet 3  . Lidocaine, Anorectal, 5 % CREA Apply topically as needed.    Marland Kitchen losartan (COZAAR) 25 MG tablet Take 0.5 tablets (12.5 mg total) by mouth at bedtime. 15 tablet 3  . metoprolol succinate (TOPROL XL) 25 MG 24 hr tablet Take 1 tablet (25 mg total) by mouth daily. 30 tablet 3  . mometasone-formoterol (DULERA) 200-5 MCG/ACT AERO Dulera 200 mcg-5 mcg/actuation HFA aerosol inhaler  INHALE 2 PUFFS BY MOUTH FIRST THING IN THE MORNING AND THEN ANOTHER 2 PUFFS ABOUT 12 HOURS LATER (Patient taking differently: Dulera 200 mcg-5 mcg/actuation HFA aerosol inhaler  INHALE 2 PUFFS BY MOUTH FIRST THING IN THE MORNING AND THEN ANOTHER 2 PUFFS ABOUT 12 HOURS LATER) 1 each 2  . montelukast (SINGULAIR) 10 MG tablet  Take 1 tablet (10 mg total) by mouth at bedtime. 30 tablet 5  . omeprazole (PRILOSEC) 20 MG capsule Take 1 capsule (20 mg total) by mouth 2 (two) times daily before a meal. 180 capsule 1  . polyethylene glycol (MIRALAX / GLYCOLAX) 17 g packet Take 17 g by mouth daily as needed. 14 each 0  . predniSONE (DELTASONE) 10 MG tablet Take 1 tablet (10 mg total) by mouth daily with breakfast. 30 tablet 5  . roflumilast (DALIRESP) 500 MCG TABS tablet Take 1 tablet (500 mcg total) by mouth daily. 30 tablet 5  . Tiotropium Bromide Monohydrate (SPIRIVA RESPIMAT) 2.5 MCG/ACT AERS Inhale 2.5 mcg into the lungs 2 (two) times daily. 4 g 11  . Zinc 100 MG TABS Take 1 tablet by mouth daily.    . metFORMIN (GLUCOPHAGE XR) 750 MG 24 hr tablet Take 1 tablet (750 mg total) by mouth daily with breakfast. 90 tablet 1   No facility-administered medications prior to visit.    ROS Review of Systems  Constitutional: Negative for diaphoresis and fatigue.  HENT: Negative.   Eyes: Negative.   Respiratory: Positive for shortness of breath. Negative for cough, chest tightness and wheezing.   Cardiovascular: Negative for chest pain, palpitations and leg swelling.  Gastrointestinal: Negative for abdominal pain.  Endocrine: Negative.  Negative for polydipsia, polyphagia and polyuria.  Genitourinary: Negative.  Negative for difficulty urinating.  Musculoskeletal: Negative.  Negative for myalgias.  Skin: Negative.   Neurological:  Positive for dizziness. Negative for weakness, light-headedness and headaches.  Hematological: Negative for adenopathy. Does not bruise/bleed easily.  Psychiatric/Behavioral: Negative.     Objective:  BP 118/74 (BP Location: Left Arm, Patient Position: Sitting, Cuff Size: Large)   Pulse 74   Temp 98 F (36.7 C) (Oral)   Resp 16   Ht 5\' 10"  (1.778 m)   Wt 225 lb (102.1 kg)   SpO2 94%   BMI 32.28 kg/m   BP Readings from Last 3 Encounters:  05/08/21 118/74  04/29/21 118/74  04/23/21  110/62    Wt Readings from Last 3 Encounters:  05/08/21 225 lb (102.1 kg)  04/29/21 229 lb 6.4 oz (104.1 kg)  04/23/21 223 lb 12.8 oz (101.5 kg)    Physical Exam Vitals reviewed.  Constitutional:      Appearance: He is obese.  HENT:     Nose: Nose normal.     Mouth/Throat:     Mouth: Mucous membranes are moist.  Eyes:     General: No scleral icterus.    Conjunctiva/sclera: Conjunctivae normal.  Cardiovascular:     Rate and Rhythm: Normal rate and regular rhythm.     Heart sounds: S1 normal and S2 normal. Heart sounds are distant. No murmur heard.   Pulmonary:     Effort: Pulmonary effort is normal.     Breath sounds: No stridor. Examination of the right-middle field reveals wheezing and rhonchi. Examination of the left-middle field reveals wheezing and rhonchi. Wheezing (exp) and rhonchi (exp) present. No rales.  Abdominal:     General: Abdomen is protuberant. Bowel sounds are normal. There is no distension.     Palpations: Abdomen is soft. There is no hepatomegaly, splenomegaly or mass.  Musculoskeletal:     Cervical back: Neck supple.     Right lower leg: No edema.     Left lower leg: No edema.  Lymphadenopathy:     Cervical: No cervical adenopathy.  Skin:    General: Skin is warm and dry.  Neurological:     General: No focal deficit present.     Mental Status: He is alert.  Psychiatric:        Mood and Affect: Mood normal.        Behavior: Behavior normal.     Lab Results  Component Value Date   WBC 7.3 04/16/2021   HGB 15.4 04/16/2021   HCT 46.0 04/16/2021   PLT 170 04/16/2021   GLUCOSE 109 (H) 04/23/2021   CHOL 209 (H) 03/25/2021   TRIG 216.0 (H) 03/25/2021   HDL 45.20 03/25/2021   LDLDIRECT 150.0 03/25/2021   LDLCALC 108 (H) 07/10/2020   ALT 18 04/15/2021   AST 25 04/15/2021   NA 140 04/23/2021   K 4.3 04/23/2021   CL 103 04/23/2021   CREATININE 1.08 04/23/2021   BUN 23 04/23/2021   CO2 29 04/23/2021   TSH 1.147 04/16/2021   PSA 1.7  07/10/2020   INR 0.9 04/15/2021   HGBA1C 6.3 03/25/2021    CUP PACEART INCLINIC DEVICE CHECK  Result Date: 04/29/2021 Wound check appointment. Steri-strips removed. Wound without redness or edema. Incision edges approximated, wound well healed. Normal device function. Thresholds, sensing, and impedances consistent with implant measurements. Device programmed at 3.5V/auto capture programmed on for extra safety margin until 3 month visit. Histogram distribution appropriate for patient and level of activity. No mode switches or high ventricular rates noted. Patient educated about wound care, arm mobility, lifting restrictions. ROV in 3 months with . [place].  Next scheduled remote 07/18/21.Lavenia Atlas, BSN, RN   Assessment & Plan:   Jerad was seen today for diabetes.  Diagnoses and all orders for this visit:  Atherosclerosis of aorta ([place])- I recommended that he take a statin for cardiovascular risk reduction. -     Pitavastatin Calcium 2 MG TABS; Take 1 tablet (2 mg total) by mouth daily.  Primary hypertension- His blood pressure is adequately well controlled.  COPD, group D, by GOLD 2017 classification ([place])- Will continue triple inhaler therapy.  Type II diabetes mellitus with manifestations ([place])- His recent A1c was down to 6.3%.  Will discontinue metformin.  Hyperlipidemia LDL goal <70 -     Pitavastatin Calcium 2 MG TABS; Take 1 tablet (2 mg total) by mouth daily.   I have discontinued [place] metFORMIN. I am also having him start on Pitavastatin Calcium. Additionally, I am having him maintain his omeprazole, albuterol, allopurinol, Spiriva Respimat, polyethylene glycol, montelukast, Dulera, albuterol, Lidocaine (Anorectal), Zinc, losartan, furosemide, predniSONE, roflumilast, and metoprolol succinate.  Meds ordered this encounter  Medications  . Pitavastatin Calcium 2 MG TABS    Sig: Take 1 tablet (2 mg total) by mouth daily.    Dispense:  90 tablet    Refill:  1      Follow-up: Return in about 4 months (around 09/08/2021).  Scarlette Calico, MD

## 2021-05-08 NOTE — Patient Instructions (Signed)
Type 2 Diabetes Mellitus, Diagnosis, Adult Type 2 diabetes (type 2 diabetes mellitus) is a long-term, or chronic, disease. In type 2 diabetes, one or both of these problems may be present:  The pancreas does not make enough of a hormone called insulin.  Cells in the body do not respond properly to insulin that the body makes (insulin resistance). Normally, insulin allows blood sugar (glucose) to enter cells in the body. The cells use glucose for energy. Insulin resistance or lack of insulin causes excess glucose to build up in the blood instead of going into cells. This causes high blood glucose (hyperglycemia).  What are the causes? The exact cause of type 2 diabetes is not known. What increases the risk? The following factors may make you more likely to develop this condition:  Having a family member with type 2 diabetes.  Being overweight or obese.  Being inactive (sedentary).  Having been diagnosed with insulin resistance.  Having a history of prediabetes, diabetes when you were pregnant (gestational diabetes), or polycystic ovary syndrome (PCOS). What are the signs or symptoms? In the early stage of this condition, you may not have symptoms. Symptoms develop slowly and may include:  Increased thirst or hunger.  Increased urination.  Unexplained weight loss.  Tiredness (fatigue) or weakness.  Vision changes, such as blurry vision.  Dark patches on the skin. How is this diagnosed? This condition is diagnosed based on your symptoms, your medical history, a physical exam, and your blood glucose level. Your blood glucose may be checked with one or more of the following blood tests:  A fasting blood glucose (FBG) test. You will not be allowed to eat (you will fast) for 8 hours or longer before a blood sample is taken.  A random blood glucose test. This test checks blood glucose at any time of day regardless of when you ate.  An A1C (hemoglobin A1C) blood test. This test  provides information about blood glucose levels over the previous 2-3 months.  An oral glucose tolerance test (OGTT). This test measures your blood glucose at two times: ? After fasting. This is your baseline blood glucose level. ? Two hours after drinking a beverage that contains glucose. You may be diagnosed with type 2 diabetes if:  Your fasting blood glucose level is 126 mg/dL (7.0 mmol/L) or higher.  Your random blood glucose level is 200 mg/dL (11.1 mmol/L) or higher.  Your A1C level is 6.5% or higher.  Your oral glucose tolerance test result is higher than 200 mg/dL (11.1 mmol/L). These blood tests may be repeated to confirm your diagnosis.   How is this treated? Your treatment may be managed by a specialist called an endocrinologist. Type 2 diabetes may be treated by following instructions from your health care provider about:  Making dietary and lifestyle changes. These may include: ? Following a personalized nutrition plan that is developed by a registered dietitian. ? Exercising regularly. ? Finding ways to manage stress.  Checking your blood glucose level as often as told.  Taking diabetes medicines or insulin daily. This helps to keep your blood glucose levels in the healthy range.  Taking medicines to help prevent complications from diabetes. Medicines may include: ? Aspirin. ? Medicine to lower cholesterol. ? Medicine to control blood pressure. Your health care provider will set treatment goals for you. Your goals will be based on your age, other medical conditions you have, and how you respond to diabetes treatment. Generally, the goal of treatment is to maintain the   following blood glucose levels:  Before meals: 80-130 mg/dL (4.4-7.2 mmol/L).  After meals: below 180 mg/dL (10 mmol/L).  A1C level: less than 7%. Follow these instructions at home: Questions to ask your health care provider Consider asking the following questions:  Should I meet with a certified  diabetes care and education specialist?  What diabetes medicines do I need, and when should I take them?  What equipment will I need to manage my diabetes at home?  How often do I need to check my blood glucose?  Where can I find a support group for people with diabetes?  What number can I call if I have questions?  When is my next appointment? General instructions  Take over-the-counter and prescription medicines only as told by your health care provider.  Keep all follow-up visits as told by your health care provider. This is important. Where to find more information  American Diabetes Association (ADA): www.diabetes.org  American Association of Diabetes Care and Education Specialists (ADCES): www.diabeteseducator.org  International Diabetes Federation (IDF): www.idf.org Contact a health care provider if:  Your blood glucose is at or above 240 mg/dL (13.3 mmol/L) for 2 days in a row.  You have been sick or have had a fever for 2 days or longer, and you are not getting better.  You have any of the following problems for more than 6 hours: ? You cannot eat or drink. ? You have nausea and vomiting. ? You have diarrhea. Get help right away if:  You have severe hypoglycemia. This means your blood glucose is lower than 54 mg/dL (3.0 mmol/L).  You become confused or you have trouble thinking clearly.  You have difficulty breathing.  You have moderate or large ketone levels in your urine. These symptoms may represent a serious problem that is an emergency. Do not wait to see if the symptoms will go away. Get medical help right away. Call your local emergency services (911 in the [place] Do not drive yourself to the hospital. Summary  Type 2 diabetes (type 2 diabetes mellitus) is a long-term, or chronic, disease. In type 2 diabetes, the pancreas does not make enough of a hormone called insulin, or cells in the body do not respond properly to insulin that the body makes (insulin  resistance).  This condition is treated by making dietary and lifestyle changes and taking diabetes medicines or insulin.  Your health care provider will set treatment goals for you. Your goals will be based on your age, other medical conditions you have, and how you respond to diabetes treatment.  Keep all follow-up visits as told by your health care provider. This is important. This information is not intended to replace advice given to you by your health care provider. Make sure you discuss any questions you have with your health care provider. Document Revised: 07/03/2020 Document Reviewed: 07/03/2020 Elsevier Patient Education  2021 [employer]  

## 2021-05-09 ENCOUNTER — Telehealth (HOSPITAL_COMMUNITY): Payer: Self-pay | Admitting: *Deleted

## 2021-05-09 NOTE — Telephone Encounter (Signed)
Pt dropped off disability form from [place].  Form completed and signed by Dr Haroldine Laws, pt aware and will p/u at front desk

## 2021-05-10 MED ORDER — PITAVASTATIN CALCIUM 2 MG PO TABS: 1.0000 | ORAL_TABLET | Freq: Every day | ORAL | 1 refills | Status: DC

## 2021-05-12 ENCOUNTER — Telehealth: Payer: Self-pay | Admitting: Cardiology

## 2021-05-12 NOTE — Telephone Encounter (Signed)
Pt c/o medication issue: 1. Name of Medication: Metoprolol succinat 2. How are you currently taking this medication (dosage and times per day)? Last week on the 18 of May 3. Are you having a reaction (difficulty breathing--STAT)?  No  4. What is your medication issue? Burns his throat and stomach and bottom.

## 2021-05-12 NOTE — Telephone Encounter (Signed)
Spent 20 minutes on phone w/ pt. This morning BP 128/74, HR 80 "something". Currently: BP 114/66, HR 72  He reports that since starting the Toprol he has had stomach issues, burning in his throat and around his anus. Denies any rash that he knows of. Denies he has started any new medications, besides this one. Some light headedness and pedal edema. He has not taken Toprol yesterday or today.   Aware I will forward to Dr. Curt Bears for review/advisement. Aware I will follow up this week w/ advisement. Pt agreeable to plan.

## 2021-05-14 ENCOUNTER — Other Ambulatory Visit: Payer: Self-pay

## 2021-05-14 ENCOUNTER — Encounter (HOSPITAL_COMMUNITY): Payer: Self-pay

## 2021-05-14 ENCOUNTER — Ambulatory Visit (HOSPITAL_COMMUNITY)
Admission: RE | Admit: 2021-05-14 | Discharge: 2021-05-14 | Disposition: A | Discharge status: Home / Self Care | Payer: 59 | Source: Ambulatory Visit | Attending: Cardiology | Admitting: Cardiology

## 2021-05-14 VITALS — BP 126/80 | HR 79 | Wt 227.6 lb

## 2021-05-14 DIAGNOSIS — I110 Hypertensive heart disease with heart failure: Secondary | ICD-10-CM | POA: Diagnosis not present

## 2021-05-14 DIAGNOSIS — Z79899 Other long term (current) drug therapy: Secondary | ICD-10-CM | POA: Diagnosis not present

## 2021-05-14 DIAGNOSIS — Z950 Presence of cardiac pacemaker: Secondary | ICD-10-CM | POA: Diagnosis not present

## 2021-05-14 DIAGNOSIS — G4733 Obstructive sleep apnea (adult) (pediatric): Secondary | ICD-10-CM | POA: Diagnosis not present

## 2021-05-14 DIAGNOSIS — I5022 Chronic systolic (congestive) heart failure: Secondary | ICD-10-CM | POA: Diagnosis present

## 2021-05-14 DIAGNOSIS — J449 Chronic obstructive pulmonary disease, unspecified: Secondary | ICD-10-CM | POA: Diagnosis not present

## 2021-05-14 DIAGNOSIS — Z87891 Personal history of nicotine dependence: Secondary | ICD-10-CM | POA: Insufficient documentation

## 2021-05-14 DIAGNOSIS — Z7952 Long term (current) use of systemic steroids: Secondary | ICD-10-CM | POA: Insufficient documentation

## 2021-05-14 DIAGNOSIS — I5042 Chronic combined systolic (congestive) and diastolic (congestive) heart failure: Secondary | ICD-10-CM | POA: Diagnosis not present

## 2021-05-14 DIAGNOSIS — Z7901 Long term (current) use of anticoagulants: Secondary | ICD-10-CM | POA: Insufficient documentation

## 2021-05-14 DIAGNOSIS — Z6832 Body mass index (BMI) 32.0-32.9, adult: Secondary | ICD-10-CM | POA: Insufficient documentation

## 2021-05-14 DIAGNOSIS — Z7951 Long term (current) use of inhaled steroids: Secondary | ICD-10-CM | POA: Insufficient documentation

## 2021-05-14 DIAGNOSIS — I428 Other cardiomyopathies: Secondary | ICD-10-CM | POA: Diagnosis not present

## 2021-05-14 LAB — BASIC METABOLIC PANEL
Anion gap: 7 (ref 5–15)
BUN: 17 mg/dL (ref 8–23)
CO2: 28 mmol/L (ref 22–32)
Calcium: 9.4 mg/dL (ref 8.9–10.3)
Chloride: 102 mmol/L (ref 98–111)
Creatinine, Ser: 0.94 mg/dL (ref 0.61–1.24)
GFR, Estimated: >60 mL/min (ref >60)
Glucose, Bld: 120 mg/dL — ABNORMAL HIGH (ref 70–99)
Potassium: 3.9 mmol/L (ref 3.5–5.1)
Sodium: 137 mmol/L (ref 135–145)

## 2021-05-14 NOTE — Progress Notes (Addendum)
ADVANCED HF CLINIC NOTE  MD: Dr. Haroldine Laws  Reason for Visit:  F/u for Chronic Systolic Heart Failure  HPI:  Robert Church is a 65 y/o male with h/o obesity, HTN, COPD with ongoing tobacco use and chronic systolic HF referred by Dr. Gwenlyn Found for further management of his HF.   He underwent cath in 4/18 due to CP. Cath showed normal coronaries with EF 40% and global HK. LVEDP 19. He was referred for a sleep study.  He saw Dr. Gwenlyn Found on 07/01/2018 because of recurrent chest pain.  Myoview stress test performed 07/21/2018 showed EF 38% inferior scar with septal ischemia.  A 2D echo performed 07/07/2018 revealed an EF of 30 to 35%.  He was referred for cardiac CT.   Cardiac CT on 08/26/18.  Calcium score 148 (74th percentile)  LAD 25% otherwise normal cors. Dilated pulmonary arteries suggestive of PH.   Works for [employer]. Follows with [name] in Pulmonary Clinic. Was started on bisoprolol 2.5 but couldn't tolerate due to fatigue. Was falling asleep at work. Stopped bisoprolol and felt better.   Echo 12/20 EF improved to 45%. Mild RV dysfunction   Recently admitted 1/22 for acute on chronic hypoxic respiratory failure secondary to acute COPDE and a/c CHF w/ volume overload. He had massive diuresis w/ IV Lasix, diuresed down from admit wt of 265 lb to 210 lb. Echo was repeated 12/21, EF 40-45% (unchanged). RV mildly reduced, right ventricular systolic pressure was 50.2 mmHg. I evaluated him for post hospital f/u and his volume status was stable. ReDs clip was 33%. BP was soft but stable. No med changes were made.   Seen back in clinic on 03/04/21 and Reported recent 7 lb wt gain and increased DOE. Also bendopnea. ReDs Clip elevated at 46%. BP 130/70. I increased spiro to 25 and added Jardiance 10 mg daily (Hgb A1c was 6.5).   Had return f/u on 4/11.  Wt down 3 lb. ReDs Clip lower but still mildly elevated at 39%. Breathing however had improved. He reported compliance w/ Jardiance w/o GU side  effects but reported he self reduced spiro back to 12.5 mg once daily. Did not tolerate 25 mg dose due to dizziness. Had similar issues w/ higher dose losartan. He refused to re-try 25 mg dose of spiro. He has lots of med intolerances.  Labs were checked and renal function/K stable. He was still drinking lots of fluids and advised to fluid restrict.   Seen by [facility] 4/11 and reported near syncope. Cardiac event monitor placed and found to be in intermittent CHB. Instructed to go to ED where he was admitted and PPM placed. Also found to be mildly fluid overloaded and treated w/ IV Lasix. Echo showed LVEF 30%. Transitioned back to PO diuretics after diuresis. Torsemide stopped and place on 40 po lasix. Continued on Jardiance and losartan.   He had post hospital f/u on 5/4 and was doing fairly well but continued w/ occasional periodic dizziness, which he attributed to [place]. He had self discontinued Jardiance and reported symptoms had resolved. Refused retrial. At the time, volume status was ok, ReDs clip was 34%. HF regimen including lasix was continued w/o dose change. On 5/6, he called the office endorsing 5 lb wt gain and instructed to increase lasix to 60 mg daily.  He returns today for f/u. ReDs Clip is elevated at 39%. Reports good UOP w/ 60 of lasix but wt is still up. NYHA Class III. No resting dyspnea. BP  ok. I reviewed med list. Now appears to be off of spiro. He reports that another provider stopped this due to dizziness as well. BP today 126/80 today    Echo 06/2018 LVEF 30-35%, RV normal  Echo 12/2018 LVEF 40-45%, RV normal  Echo 11/2020 LVEF 40-45%, RV mildly reduced Echo 03/2021 30-35%, RV mildly reduced   Review of systems complete and found to be negative unless listed in HPI.      Past Medical History:  Diagnosis Date  . Adenomatous colon polyp   . Allergy   . Anal fissure   . Asthma   . Bronchitis   . CHF (congestive heart failure) ([place])   . COPD (chronic obstructive  pulmonary disease) ([place])   . Emphysema lung ([place])   . GERD (gastroesophageal reflux disease)   . Hyperlipidemia   . Hypertension   . OSA (obstructive sleep apnea)   . Pneumonia   . Sinusitis   . SOB (shortness of breath)     Current Outpatient Medications  Medication Sig Dispense Refill  . albuterol (PROVENTIL) (2.5 MG/3ML) 0.083% nebulizer solution Take 3 mLs (2.5 mg total) by nebulization every 6 (six) hours as needed for wheezing or shortness of breath. 75 mL 12  . albuterol (VENTOLIN HFA) 108 (90 Base) MCG/ACT inhaler INHALE 2 PUFFS INTO THE LUNGS EVERY 6 HOURS AS NEEDED FOR WHEEZING 18 g 2  . allopurinol (ZYLOPRIM) 300 MG tablet Take 300 mg by mouth every other day.    . ALPRAZolam (XANAX) 0.25 MG tablet Take 0.75 mg by mouth at bedtime.    . furosemide (LASIX) 40 MG tablet Take 1.5 tablets (60 mg total) by mouth daily. 45 tablet 3  . Lidocaine, Anorectal, 5 % CREA Apply topically as needed.    Marland Kitchen losartan (COZAAR) 25 MG tablet Take 0.5 tablets (12.5 mg total) by mouth at bedtime. 15 tablet 3  . mometasone-formoterol (DULERA) 200-5 MCG/ACT AERO Dulera 200 mcg-5 mcg/actuation HFA aerosol inhaler  INHALE 2 PUFFS BY MOUTH FIRST THING IN THE MORNING AND THEN ANOTHER 2 PUFFS ABOUT 12 HOURS LATER (Patient taking differently: Dulera 200 mcg-5 mcg/actuation HFA aerosol inhaler  INHALE 2 PUFFS BY MOUTH FIRST THING IN THE MORNING AND THEN ANOTHER 2 PUFFS ABOUT 12 HOURS LATER) 1 each 2  . montelukast (SINGULAIR) 10 MG tablet Take 1 tablet (10 mg total) by mouth at bedtime. 30 tablet 5  . omeprazole (PRILOSEC) 20 MG capsule Take 1 capsule (20 mg total) by mouth 2 (two) times daily before a meal. 180 capsule 1  . Pitavastatin Calcium 2 MG TABS Take 1 tablet (2 mg total) by mouth daily. 90 tablet 1  . polyethylene glycol (MIRALAX / GLYCOLAX) 17 g packet Take 17 g by mouth daily as needed. 14 each 0  . predniSONE (DELTASONE) 10 MG tablet Take 1 tablet (10 mg total) by mouth daily with breakfast. 30  tablet 5  . roflumilast (DALIRESP) 500 MCG TABS tablet Take 1 tablet (500 mcg total) by mouth daily. 30 tablet 5  . Tiotropium Bromide Monohydrate (SPIRIVA RESPIMAT) 2.5 MCG/ACT AERS Inhale 2.5 mcg into the lungs 2 (two) times daily. 4 g 11  . Zinc 100 MG TABS Take 1 tablet by mouth daily.     No current facility-administered medications for this encounter.    Allergies  Allergen Reactions  . Clindamycin Other (See Comments)    Tongue swelling  . Doxycycline     Severe stomach upset per patient  . E-Mycin [Erythromycin Base] Swelling  .  Jardiance [Empagliflozin] Nausea Only and Other (See Comments)    Lightheadness Dizziness  . Penicillins Swelling and Other (See Comments)    Childhood rxn--MD stated he "almost died" Has patient had a PCN reaction causing immediate rash, facial/tongue/throat swelling, SOB or lightheadedness with hypotension:Yes Has patient had a PCN reaction causing severe rash involving mucus membranes or skin necrosis:unsure Has patient had a PCN reaction that required hospitalization:unsure Has patient had a PCN reaction occurring within the last 10 years:No If all of the above answers are "NO", then may proceed with Cephalosporin use.    . Rosuvastatin     Other reaction(s): Myalgias (intolerance)      Social History   Socioeconomic History  . Marital status: Single    Spouse name: Not on file  . Number of children: Not on file  . Years of education: Not on file  . Highest education level: Not on file  Occupational History  . Occupation: Dealer  Tobacco Use  . Smoking status: Former Smoker    Packs/day: 0.25    Years: 46.00    Pack years: 11.50    Types: Cigarettes  . Smokeless tobacco: Never Used  Vaping Use  . Vaping Use: Never used  Substance and Sexual Activity  . Alcohol use: Yes    Alcohol/week: 3.0 standard drinks    Types: 3 Cans of beer per week    Comment: 3 beers per day  . Drug use: No  . Sexual activity: Yes    Birth  control/protection: None  Other Topics Concern  . Not on file  Social History Narrative  . Not on file   Social Determinants of Health   Financial Resource Strain: Medium Risk  . Difficulty of Paying Living Expenses: Somewhat hard  Food Insecurity: No Food Insecurity  . Worried About Charity fundraiser in the Last Year: Never true  . Ran Out of Food in the Last Year: Never true  Transportation Needs: No Transportation Needs  . Lack of Transportation (Medical): No  . Lack of Transportation (Non-Medical): No  Physical Activity: Not on file  Stress: Not on file  Social Connections: Not on file  Intimate Partner Violence: Not on file      Family History  Problem Relation Age of Onset  . Lung cancer Father   . Brain cancer Father   . Diabetes Maternal Grandmother   . Colon polyps Neg Hx   . Esophageal cancer Neg Hx   . Pancreatic cancer Neg Hx   . Stomach cancer Neg Hx     Vitals:   05/14/21 1423  BP: 126/80  Pulse: 79  SpO2: 93%  Weight: 103.2 kg (227 lb 9.6 oz)    PHYSICAL EXAM: General:  Well appearing, moderately obese. No respiratory difficulty HEENT: normal Neck: supple. JVD 9 cm. Carotids 2+ bilat; no bruits. No lymphadenopathy or thyromegaly appreciated. Cor: PMI nondisplaced. Regular rate & rhythm. No rubs, gallops or murmurs. Lungs: clear Abdomen: obese, soft, nontender, nondistended. No hepatosplenomegaly. No bruits or masses. Good bowel sounds. Extremities: no cyanosis, clubbing, rash, trace bilateral LE edema Neuro: alert & oriented x 3, cranial nerves grossly intact. moves all 4 extremities w/o difficulty. Affect pleasant.    ASSESSMENT & PLAN:  1. Chronic systolic HF due to NICM  - cath 4/18 normal cors. EF 48%  - Myoview 07/21/2018 showed EF 38% inferior scar with septal ischemia.   - Echo 07/07/2018 revealed EF 30-35%. RV normal.  - Cardiac CT 08/26/18.  Calcium score 148 (  74th percentile)  LAD 25% otherwise normal cors. Dilated pulmonary arteries  suggestive of PH.  - Suspect NICM due to HTN and inadequately treated OSA (He now reports improved nightly compliance w/ CPAP). - Echo 12/20 EF 45% mild RV dysfunction - Echo 1/22 EF unchanged 40-45%. RV mildly reduced. RVSP 49 - Recent admit 4/22 for symptomatic bradycardia/CHF. Echo LVEF 30%. RV mildly reduced - Chronically NYHA Class II-III, confounded by COPD and obesity/ deconditioning  - Volume status elevated. Wt is up 5 lb and ReDs Clip 39%  - Intolerant to Jardiance (positional dizziness and nausea). Refuses retrial  - Continue Losartan 12.5 mg daily (failed Entresto due to dizziness/low BP) - Off spiro due to dizziness  - Could not tolerate bisoprolol due to fatigue. Had dizziness w/ Coreg.  Had dry mouth w/ Toprol.  - Increase Lasix to 60 mg bid x 2 days, then back to 60 mg daily  - He has struggled w/ ability tolerating higher dose meds due to dizziness, low BP. Will not titrate regimen today  - He has had at least 2 hospitalizations in the last year for CHF w/ NYHA III symptoms and up/downward titration of diuretics.  I think that he would benefit from CardioMEMEs implantation. We disused this today and he will think about it and further d/w Dr. Haroldine Laws at his next f/u visit. Will start insurance approval process  - Also w/ CHB, Biventricular dysfunction, moderate LVH and inability to tolerate HF meds due to orthostasis, I worry about infiltrative CM (sarcoid, amyloid). Unfortunately his PPM may prohibit cMRI. Will check w/ EP to see if device is MRI compatible. May need PET [facility]. - Check Multiple Myeloma Panel and Urine Immunofixation  - Check BMP today   - Discussed daily wts  - Advised fluid restriction < 2L/day + low sodium diet   2. CHB: s/p PPM 4/22 - followed by Dr. Curt Bears   3. HTN - well controlled on current regimen - he has only been able to tolerate low doses of losartan. Arlyce Harman recently stopped   4. COPD  -  has quit smoking, congratulated on efforts  -   Follows with Pulmonary ([name]) -  home O2 PRN -  continue BiPAP   5. OSA - reports nightly compliance w/ BiPAP  6. Morbid obesity  Body mass index is 32.66 kg/m. - wt loss advised - long discussion regarding low sodium/ low carb, heart healthy diet   7. Pulmonary Nodules - followed by [name] - recent CT scan 3/25 stable, annual screening advised.   Keep  F/u visit w/ Dr. Haroldine Laws in 6 weeks   ADDENDUM: d/w device clinic, pt's device is MRI compatible    Lyda Jester, PA-C  2:44 PM

## 2021-05-14 NOTE — Progress Notes (Signed)
ReDS Vest / Clip - 05/14/21 1423      ReDS Vest / Clip   Station Marker D    Ruler Value 32.5    ReDS Value Range Moderate volume overload    ReDS Actual Value 39

## 2021-05-14 NOTE — Telephone Encounter (Signed)
Left message to call back  

## 2021-05-14 NOTE — Telephone Encounter (Signed)
Pt made aware to stop Metoprolol, or don't resume - per Dr. Curt Bears. Pt asks if he is to restart the Carvedilol (what we stopped when starting Metoprolol. Switched d/t reported lightheadedness/dizziness when standing on Carvedilol). Pt aware I will forward to [facility] for advisement -- but pt tells me he is sitting in [facility] for f/u appt. Advised to discuss with them and they would discuss restarting vs another medication d/t SE on previous BBs mentioned above. Patient verbalized understanding and agreeable to plan.

## 2021-05-14 NOTE — Patient Instructions (Addendum)
INCREASE Lasix to 60 mg twice a day for 2 days then resume normal dose of 60 mg daily thereafter  Labs today We will only contact you if something comes back abnormal or we need to make some changes. Otherwise no news is good news!   The CardioMEMS System consists of a small pressure-sensing device that is implanted directly into your pulmonary artery. Once implanted, the sensor measures and transmits your blood flow pressure and heart rate. -once we have this approved with your insurance we will proceed with scheduling  Keep follow up as scheduled with Dr Haroldine Laws  Do the following things EVERYDAY: 1) Weigh yourself in the morning before breakfast. Write it down and keep it in a log. 2) Take your medicines as prescribed 3) Eat low salt foods--Limit salt (sodium) to 2000 mg per day.  4) Stay as active as you can everyday 5) Limit all fluids for the day to less than 2 liters  At the [facility], you and your health needs are our priority. As part of our continuing mission to provide you with exceptional heart care, we have created designated Provider Care Teams. These Care Teams include your primary Cardiologist (physician) and Advanced Practice Providers (APPs- Physician Assistants and Nurse Practitioners) who all work together to provide you with the care you need, when you need it.   You may see any of the following providers on your designated Care Team at your next follow up: Marland Kitchen Dr Glori Bickers . Dr Loralie Champagne . Dr Vickki Muff . Darrick Grinder, NP . Lyda Jester, [place] . Audry Riles, PharmD   Please be sure to bring in all your medications bottles to every appointment.   If you have any questions or concerns before your next appointment please send [place] a message through [place] or call our office at [phone].    TO LEAVE A MESSAGE FOR THE NURSE SELECT OPTION 2, PLEASE LEAVE A MESSAGE INCLUDING: . YOUR NAME . DATE OF  BIRTH . CALL BACK NUMBER . REASON FOR CALL**this is important as we prioritize the call backs  YOU WILL RECEIVE A CALL BACK THE SAME DAY AS LONG AS YOU CALL BEFORE 4:00 PM

## 2021-05-15 ENCOUNTER — Encounter: Payer: Self-pay | Admitting: Neurology

## 2021-05-15 ENCOUNTER — Telehealth: Payer: Self-pay | Admitting: Cardiology

## 2021-05-15 ENCOUNTER — Ambulatory Visit: Payer: 59 | Admitting: Neurology

## 2021-05-15 VITALS — BP 117/66 | HR 56 | Ht 70.0 in | Wt 228.5 lb

## 2021-05-15 DIAGNOSIS — M545 Low back pain, unspecified: Secondary | ICD-10-CM

## 2021-05-15 DIAGNOSIS — R202 Paresthesia of skin: Secondary | ICD-10-CM

## 2021-05-15 DIAGNOSIS — R269 Unspecified abnormalities of gait and mobility: Secondary | ICD-10-CM

## 2021-05-15 DIAGNOSIS — G8929 Other chronic pain: Secondary | ICD-10-CM

## 2021-05-15 DIAGNOSIS — M542 Cervicalgia: Secondary | ICD-10-CM | POA: Diagnosis not present

## 2021-05-15 MED ORDER — GABAPENTIN 100 MG PO CAPS: 100.0000 mg | ORAL_CAPSULE | Freq: Three times a day (TID) | ORAL | 11 refills | Status: DC

## 2021-05-15 NOTE — Addendum Note (Signed)
Encounter addended by: Consuelo Pandy, PA-C on: 05/15/2021 1:25 PM  Actions taken: Clinical Note Signed

## 2021-05-15 NOTE — Progress Notes (Signed)
Chief Complaint  Patient presents with  . New Patient (Initial Visit)    Rm 16, alone. Internal referral from Sherrian Divers, MD (PCP) for balance issues. Sx started after he fell in December. [place] three separate times. Had CT scan done. He brought report/CD.  He is claustrophobic, unable to do MRI's. He gets dizzy when he stands. Has pacemaker but HR fluctuating from 50's-90's in office today. He is going to call [facility] at [phone].       ASSESSMENT AND PLAN  Robert Church is a 65 y.o. male   Gait abnormality since multiple fall injury Worsening neck pain, low back pain  On examination, he has length dependent sensory changes, marked bilateral finger abduction weakness, absent ankle reflex, wide based, cautious gait  Differentiation diagnosis include cervical spondylitic myelopathy, lumbar radiculopathy, in the background of peripheral neuropathy  Not MRI candidate due to pacemaker  CT of cervical, lumbar spine  EMG nerve conduction study   , DIAGNOSTIC DATA (LABS, IMAGING, TESTING) - I reviewed patient records, labs, notes, testing and imaging myself where available.  Cervical spine x-ray on January 09, 2021  1. No acute displaced fracture or dislocation. 2. Advanced degenerative changes throughout the lower cervical spine with bilateral osseous neural foraminal narrowing. 3. Bilateral carotid artery calcifications.  X-ray of lumbar spine on January 09, 2019.  1. No acute compression fracture. 2. Degenerative changes in the lower lumbar spine.  HISTORICAL  [name] is a 65 year old male, seen in request by his primary care physician Dr. Scarlette Calico for evaluation of new onset gait abnormality, he is alone at today's visit on May 15, 2021  I reviewed and summarized the referring note.  Past medical history Hypertension Hyperlipidemia Coronary artery disease COPD Longtime smoker Congestive heart failure Status post  pacemaker Anxiety  Obstructive sleep apnea.  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  Numbness of bilateral fourth and fifth fingers, mild grip weakness, intermittent bilateral toes paresthesia, he denies bowel and bladder incontinence, also complains of worsening midline low back pain, denies radiating pain  Personally reviewed x-ray of cervical [place] on January 09, 2021, advanced degenerative changes to lower cervical spine region, with bilateral neuroforaminal narrowing, lower lumbar degenerative changes   PHYSICAL EXAM:   Vitals:   05/15/21 1530  BP: 117/66  Pulse: (!) 56  Weight: 228 lb 8 oz (103.6 kg)  Height: 5\' 10"  (1.778 m)   Not recorded     Body mass index is 32.79 kg/m.  PHYSICAL EXAMNIATION:  Gen: NAD, conversant, well nourised, well groomed                     Cardiovascular: Regular rate rhythm, no peripheral edema, warm, nontender. Eyes: Conjunctivae clear without exudates or hemorrhage Neck: Supple, no carotid bruits. Pulmonary: Clear to auscultation bilaterally   NEUROLOGICAL EXAM:  MENTAL STATUS: Speech:    Speech is normal; fluent and spontaneous with normal comprehension.  Cognition:     Orientation to time, place and person     Normal recent and remote memory     Normal Attention span and concentration     Normal Language, naming, repeating,spontaneous speech  Fund of knowledge   CRANIAL NERVES: CN II: Visual fields are full to confrontation. Pupils are round equal and briskly reactive to light. CN III, IV, VI: extraocular movement are normal. No ptosis. CN V: Facial sensation is intact to light touch CN VII: Face is symmetric with normal eye  closure  CN VIII: Hearing is normal to causal conversation. CN IX, X: Phonation is normal. CN XI: Head turning and shoulder shrug are intact  MOTOR: He has mild bilateral finger abduction weakness  REFLEXES: Reflexes are 1 and symmetric at the biceps, triceps, knees, and absent at ankles. Plantar responses are extensor bilaterally  SENSORY: Length dependent decreased to light touch, pinprick and vibratory sensation to ankle level  COORDINATION: There is no trunk or limb dysmetria noted.  GAIT/STANCE: He needs push-up to get up from seated position, wide-based, cautious, difficult to clear bilateral feet from the floor, difficulty standing up on bilateral tiptoe and heels  REVIEW OF SYSTEMS:  Full 14 system review of systems performed and notable only for as above All other review of systems were negative.   ALLERGIES: Allergies  Allergen Reactions  . Clindamycin Other (See Comments)    Tongue swelling  . Doxycycline     Severe stomach upset per patient  . E-Mycin [Erythromycin Base] Swelling  . Jardiance [Empagliflozin] Nausea Only and Other (See Comments)    Lightheadness Dizziness  . Penicillins Swelling and Other (See Comments)    Childhood rxn--MD stated he "almost died" Has patient had a PCN reaction causing immediate rash, facial/tongue/throat swelling, SOB or lightheadedness with hypotension:Yes Has patient had a PCN reaction causing severe rash involving mucus membranes or skin necrosis:unsure Has patient had a PCN reaction that required hospitalization:unsure Has patient had a PCN reaction occurring within the last 10 years:No If all of the above answers are "NO", then may proceed with Cephalosporin use.    . Rosuvastatin     Other reaction(s): Myalgias (intolerance)    HOME MEDICATIONS: Current Outpatient Medications  Medication Sig Dispense Refill  . albuterol (PROVENTIL) (2.5 MG/3ML) 0.083% nebulizer solution Take 3 mLs (2.5 mg total) by nebulization  every 6 (six) hours as needed for wheezing or shortness of breath. 75 mL 12  . albuterol (VENTOLIN HFA) 108 (90 Base) MCG/ACT inhaler INHALE 2 PUFFS INTO THE LUNGS EVERY 6 HOURS AS NEEDED FOR WHEEZING 18 g 2  . allopurinol (ZYLOPRIM) 300 MG tablet Take 300 mg by mouth every other day.    . ALPRAZolam (XANAX) 0.25 MG tablet Take 0.75 mg by mouth at bedtime.    . furosemide (LASIX) 40 MG tablet Take 1.5 tablets (60 mg total) by mouth daily. 45 tablet 3  . Lidocaine, Anorectal, 5 % CREA Apply topically as needed.    Marland Kitchen losartan (COZAAR) 25 MG tablet Take 0.5 tablets (12.5 mg total) by mouth at bedtime. 15 tablet 3  . mometasone-formoterol (DULERA) 200-5 MCG/ACT AERO Dulera 200 mcg-5 mcg/actuation HFA aerosol inhaler  INHALE 2 PUFFS BY MOUTH FIRST THING IN THE MORNING AND THEN ANOTHER 2 PUFFS ABOUT 12 HOURS LATER (Patient taking differently: Dulera 200 mcg-5 mcg/actuation HFA aerosol inhaler  INHALE 2 PUFFS BY MOUTH FIRST THING IN THE MORNING AND THEN ANOTHER 2 PUFFS ABOUT 12 HOURS LATER) 1 each 2  . montelukast (SINGULAIR) 10 MG tablet Take 1 tablet (10 mg total) by mouth at bedtime. 30 tablet 5  . omeprazole (PRILOSEC) 20 MG capsule Take 1 capsule (20 mg total) by mouth 2 (two) times daily before a meal. 180  capsule 1  . Pitavastatin Calcium 2 MG TABS Take 1 tablet (2 mg total) by mouth daily. 90 tablet 1  . polyethylene glycol (MIRALAX / GLYCOLAX) 17 g packet Take 17 g by mouth daily as needed. 14 each 0  . predniSONE (DELTASONE) 10 MG tablet Take 1 tablet (10 mg total) by mouth daily with breakfast. 30 tablet 5  . roflumilast (DALIRESP) 500 MCG TABS tablet Take 1 tablet (500 mcg total) by mouth daily. 30 tablet 5  . Tiotropium Bromide Monohydrate (SPIRIVA RESPIMAT) 2.5 MCG/ACT AERS Inhale 2.5 mcg into the lungs 2 (two) times daily. 4 g 11  . Zinc 100 MG TABS Take 1 tablet by mouth daily.     No current facility-administered medications for this visit.    PAST MEDICAL HISTORY: Past Medical  History:  Diagnosis Date  . Adenomatous colon polyp   . Allergy   . Anal fissure   . Asthma   . Bronchitis   . CHF (congestive heart failure) ([place])   . COPD (chronic obstructive pulmonary disease) ([place])   . Emphysema lung ([place])   . GERD (gastroesophageal reflux disease)   . Hyperlipidemia   . Hypertension   . OSA (obstructive sleep apnea)   . Pneumonia   . Sinusitis   . SOB (shortness of breath)     PAST SURGICAL HISTORY: Past Surgical History:  Procedure Laterality Date  . LEFT HEART CATH AND CORONARY ANGIOGRAPHY N/A 04/19/2017   Procedure: Left Heart Cath and Coronary Angiography;  Surgeon: Belva Crome, MD;  Location: [facility];  Service: Cardiovascular;  Laterality: N/A;  . PACEMAKER IMPLANT N/A 04/16/2021   Procedure: PACEMAKER IMPLANT;  Surgeon: Constance Haw, MD;  Location: [facility];  Service: Cardiovascular;  Laterality: N/A;  . TONSILLECTOMY      FAMILY HISTORY: Family History  Problem Relation Age of Onset  . Lung cancer Father   . High blood pressure Mother   . Diabetes Maternal Grandmother   . Colon polyps Neg Hx   . Esophageal cancer Neg Hx   . Pancreatic cancer Neg Hx   . Stomach cancer Neg Hx     SOCIAL HISTORY: Social History   Socioeconomic History  . Marital status: Single    Spouse name: Not on file  . Number of children: 1  . Years of education: 28  . Highest education level: Not on file  Occupational History  . Occupation: disabled  Tobacco Use  . Smoking status: Current Every Day Smoker    Packs/day: 0.25    Years: 46.00    Pack years: 11.50    Types: Cigarettes  . Smokeless tobacco: Never Used  Vaping Use  . Vaping Use: Never used  Substance and Sexual Activity  . Alcohol use: Yes    Alcohol/week: 3.0 standard drinks    Types: 3 Cans of beer per week    Comment: 2 beers per day  . Drug use: No  . Sexual activity: Yes    Birth control/protection: None  Other Topics Concern  . Not on file  Social  History Narrative   Right handed   Tea sometimes and coffee (1/2 caffeine)   Social Determinants of Health   Financial Resource Strain: Medium Risk  . Difficulty of Paying Living Expenses: Somewhat hard  Food Insecurity: No Food Insecurity  . Worried About Charity fundraiser in the Last Year: Never true  . Ran Out of Food in the Last Year: Never true  Transportation  Needs: No Transportation Needs  . Lack of Transportation (Medical): No  . Lack of Transportation (Non-Medical): No  Physical Activity: Not on file  Stress: Not on file  Social Connections: Not on file  Intimate Partner Violence: Not on file      Marcial Pacas, M.D. Ph.D.  [facility] [address] [place], [place] [address] Ph: [phone] Fax: [phone]  CC:  Janith Lima, MD [place],  [place] [address]  Janith Lima, MD

## 2021-05-15 NOTE — Telephone Encounter (Signed)
STAT if HR is under 50 or over 120 (normal HR is 60-100 beats per minute)  1) What is your heart rate? 58 (per finger monitor)  2) Do you have a log of your heart rate readings (document readings)? 49, 55, 98, 80,74,50  3) Do you have any other symptoms? Dizziness, SOB   STAT if patient feels like he/she is going to faint   1) Are you dizzy now? No. Pt is sitting down  2) Do you feel faint or have you passed out? no  3) Do you have any other symptoms? Irregular HR, SOB   4) Have you checked your HR and BP (record if available)? All today  49, 55, 98, 80,74,50   Pt c/o Shortness Of Breath: STAT if SOB developed within the last 24 hours or pt is noticeably SOB on the phone  1. Are you currently SOB (can you hear that pt is SOB on the phone)? A little bit. Pt does have COPD  2. How long have you been experiencing SOB? The past couple days. Patient was at the HF clinic yesterday and everything seemed fine   3. Are you SOB when sitting or when up moving around? All the time  4. Are you currently experiencing any other symptoms? Irregular HR, dizziness

## 2021-05-15 NOTE — Telephone Encounter (Signed)
Spoke with patient and set him up for an appointment on 6/1 @ 8:45 with Tommye Standard for irregular HR.  Advised if he feels SOB, dizziness or chest pain to go to the emergency room.  Patient will log recordings and bring to his office visit.

## 2021-05-15 NOTE — Telephone Encounter (Signed)
Spoke with patient today after leaving his neurologist office and he reports irregular heart rate.  Patient had stopped taking his toprol earlier in the week from issues with throat burning. Patient had taken carvedilol and was unable to tolerate.  Patient reports HR from 47 to 98.  Patient reports SOB but states he always has SOB due to COPD and asthma.    Patient on his way home and will monitor his HR and advised patient I was sending information to Dr. Curt Bears for advisement.  This RN will call patient back closer to 5:30.

## 2021-05-16 LAB — IMMUNOFIXATION, URINE

## 2021-05-19 NOTE — Progress Notes (Signed)
Cardiology Office Note Date:  05/21/2021  Patient ID:  Robert Church, Robert Church [birth year], MRN [id number] PCP:  Janith Lima, MD  Cardiologist:  Dr. Gwenlyn Found AHF: Dr. Aundra Dubin Electrophysiologist: Dr. Curt Bears    Chief Complaint: irregular  HRs  History of Present Illness: Robert Church is a 65 y.o. 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 He had massive diuresis w/ IV Lasix, diuresed down from admit wt of 265 lb to 210 lb. Echo was repeated 12/21, EF 40-45% (unchanged). RV mildly reduced, right ventricular systolic pressure was 43.3 mmHg  Hospitalized April 2022 with recurrent syncope, near syncope, CHB, underwent PPM implant, HF team assisted  As well. In review of his prior monitor, unconvinced of true AF and planned to monitor via his device, his Eliquis stopped Neck pain was deferred to his PMD, onse apparently back in dec after a syncopal event   He saw HF team 04/23/21 with some dizziness that he attributed to the jardiance and self stopped it with improvement and declined a retrial  Phone notes report that the patient reported since starting the Toprol he has had stomach issues, burning in his throat and around his anus and advised to hold the Toprol.  05/13/21: f/u with HF team, off spironolactone, unclear, reported another provider stopped it 2/2 dizziness and BP?, prior phone notes report that he had increased weight and lasix was increased, he reported good urine OP with this, still winded  Intolerant to Jardiance (positional dizziness and nausea). Refuses retrial  - Continue Losartan 12.5 mg daily (failed Entresto due to dizziness/low BP) - Off spiro due to dizziness  - Could not tolerate bisoprolol due to fatigue. Had  dizziness w/ Coreg.  Had dry mouth w/ Toprol.   Another phone note with reports of dizziness irregular HRs, some degree of baseline SOB and given an appointment to be seen.  TODAY He reports that he was at his neurologist's office last week and they were getting erratic HR's anywhere from the 49-50 -90's range including taking a manual pulse and recommended coming to have his device checked. He has noted 60-90's at home by his machine. BP's have been OK. He reports sine discharge he continued to have intermittent fleeting dizzy spells, these have slolwy started to get less. No near syncope or syncope. His breathing is "bad". Mentions he forgot his Symbicort today and will occasionally miss meds.  His lasix increased recently and is noting increased urine frequency, not a clear improvement in sDOE yet. No CP, no palpitations    Device information Abbott dual chamber PPM implanted 04/16/21 Post-op day one, noted some tracking retrograde P waves, intermittently, no AFib.  Deviece was programmed DDI to avoid this   AFib Hx Diagnosed April 2022  AAD Hx none to date   Past Medical History:  Diagnosis Date  . Adenomatous colon polyp   . Allergy   . Anal fissure   . Asthma   . Bronchitis   . CHF (congestive heart failure) ([place])   . COPD (chronic obstructive pulmonary disease) ([place])   . Emphysema lung ([place])   . GERD (gastroesophageal reflux disease)   . Hyperlipidemia   . Hypertension   . OSA (obstructive sleep apnea)   .  Pneumonia   . Sinusitis   . SOB (shortness of breath)     Past Surgical History:  Procedure Laterality Date  . LEFT HEART CATH AND CORONARY ANGIOGRAPHY N/A 04/19/2017   Procedure: Left Heart Cath and Coronary Angiography;  Surgeon: Belva Crome, MD;  Location: [facility];  Service: Cardiovascular;  Laterality: N/A;  . PACEMAKER IMPLANT N/A 04/16/2021   Procedure: PACEMAKER IMPLANT;  Surgeon: Constance Haw, MD;  Location: [facility];   Service: Cardiovascular;  Laterality: N/A;  . TONSILLECTOMY      Current Outpatient Medications  Medication Sig Dispense Refill  . albuterol (PROVENTIL) (2.5 MG/3ML) 0.083% nebulizer solution Take 3 mLs (2.5 mg total) by nebulization every 6 (six) hours as needed for wheezing or shortness of breath. 75 mL 12  . albuterol (VENTOLIN HFA) 108 (90 Base) MCG/ACT inhaler INHALE 2 PUFFS INTO THE LUNGS EVERY 6 HOURS AS NEEDED FOR WHEEZING 18 g 2  . allopurinol (ZYLOPRIM) 300 MG tablet Take 300 mg by mouth every other day.    . ALPRAZolam (XANAX) 0.25 MG tablet Take 0.75 mg by mouth at bedtime.    . furosemide (LASIX) 40 MG tablet Take 1.5 tablets (60 mg total) by mouth daily. 45 tablet 3  . gabapentin (NEURONTIN) 100 MG capsule Take 1 capsule (100 mg total) by mouth 3 (three) times daily. 90 capsule 11  . Lidocaine, Anorectal, 5 % CREA Apply topically as needed.    Marland Kitchen losartan (COZAAR) 25 MG tablet Take 0.5 tablets (12.5 mg total) by mouth at bedtime. 15 tablet 3  . mometasone-formoterol (DULERA) 200-5 MCG/ACT AERO Inhale 2 puffs into the lungs 2 (two) times daily.    . montelukast (SINGULAIR) 10 MG tablet Take 1 tablet (10 mg total) by mouth at bedtime. 30 tablet 5  . omeprazole (PRILOSEC) 20 MG capsule Take 1 capsule (20 mg total) by mouth 2 (two) times daily before a meal. 180 capsule 1  . Pitavastatin Calcium 2 MG TABS Take 1 tablet (2 mg total) by mouth daily. 90 tablet 1  . polyethylene glycol (MIRALAX / GLYCOLAX) 17 g packet Take 17 g by mouth daily as needed. 14 each 0  . predniSONE (DELTASONE) 10 MG tablet Take 1 tablet (10 mg total) by mouth daily with breakfast. 30 tablet 5  . roflumilast (DALIRESP) 500 MCG TABS tablet Take 1 tablet (500 mcg total) by mouth daily. 30 tablet 5  . Tiotropium Bromide Monohydrate (SPIRIVA RESPIMAT) 2.5 MCG/ACT AERS Inhale 2.5 mcg into the lungs 2 (two) times daily. 4 g 11  . Zinc 100 MG TABS Take 1 tablet by mouth daily.     No current facility-administered  medications for this visit.    Allergies:   Clindamycin, Doxycycline, E-mycin [erythromycin base], Jardiance [empagliflozin], Penicillins, and Rosuvastatin   Social History:  The patient  reports that he has been smoking cigarettes. He has a 11.50 pack-year smoking history. He has never used smokeless tobacco. He reports current alcohol use of about 3.0 standard drinks of alcohol per week. He reports that he does not use drugs.   Family History:  The patient's family history includes Diabetes in his maternal grandmother; High blood pressure in his mother; Lung cancer in his father.  ROS:  Please see the history of present illness.    All other systems are reviewed and otherwise negative.   PHYSICAL EXAM:  VS:  BP 122/80   Pulse 62   Ht 5\' 10"  (1.778 m)   Wt 231  lb 12.8 oz (105.1 kg)   SpO2 92%   BMI 33.26 kg/m  BMI: Body mass index is 33.26 kg/m. Well nourished, well developed, in no acute distress HEENT: normocephalic, atraumatic Neck: no JVD, carotid bruits or masses Cardiac:  RRR; no significant murmurs, no rubs, or gallops Lungs:  Faint crackles L base, CTA otherwise b/l, no wheezing, rhonchi or rales Abd: soft, nontender MS: no deformity or atrophy Ext: trace edema Skin: warm and dry, no rash Neuro:  No gross deficits appreciated Psych: euthymic mood, full affect  PPM site is stable, healed well, no tethering or discomfort   EKG:  Done today and reviewed by myself shows   Initial EKG is asynchronous V pacing With programming changes SR/V pacing, PAC with intrinsic conduction    Device interrogation done today and reviewed by myself:  Battery and lead measurements are good Acute implant lead outputs remain No AFIB/arrhythmias AP 18% VP 26% Presenting rhythm is SR w/AR's and V pacing Underlying is CHB, V rates about 40 Programmed DDD he again appears to track retrigrade P waves at 130, PMT algorhythm is observed to work, the patient very symptomatic feeling  lightheaded And DDI programming rsumed with resolution of symptoms and tachycardia  I reached out to industry for programming options to try and get better AV synchrony and avoid PMT/tracking Programmed DDD 60/100 Paced/sensed AV delaye changed from 350 > 200 both PVARP extended to 259ms  This resulted in SR/v pacing intermittent wenckebach The patient after programming changes ambulated to the restroom and back reported initially upon standing a bit lightheaded though quickly resolved and no symptos otherwise noted with the changes made   04/16/21: TTE IMPRESSIONS  1. Left ventricular ejection fraction, by estimation, is 30 to 35%. The  left ventricle has moderately decreased function. The left ventricle  demonstrates global hypokinesis. The left ventricular internal cavity size  was moderately dilated. There is  moderate left ventricular hypertrophy. Left ventricular diastolic  parameters are indeterminate.  2. Right ventricular systolic function is mildly reduced. The right  ventricular size is normal. Tricuspid regurgitation signal is inadequate  for assessing PA pressure.  3. The mitral valve is normal in structure. Trivial mitral valve  regurgitation.  4. The aortic valve is tricuspid. Aortic valve regurgitation is not  visualized. No aortic stenosis is present.  5. The inferior vena cava is normal in size with greater than 50%  respiratory variability, suggesting right atrial pressure of 3 mmHg.   Comparison(s): Compared to prior, LVEF has decreased.    12/18/2020: TTE IMPRESSIONS  1. Left ventricular ejection fraction, by estimation, is 40 to 45%. The  left ventricle has mildly decreased function. The left ventricle  demonstrates global hypokinesis. The left ventricular internal cavity size  was moderately dilated. There is mild  left ventricular hypertrophy. Left ventricular diastolic parameters are  consistent with Grade II diastolic dysfunction  (pseudonormalization).  Elevated left atrial pressure.  2. Right ventricular systolic function is mildly reduced. The right  ventricular size is severely enlarged. There is moderately elevated  pulmonary artery systolic pressure. The estimated right ventricular  systolic pressure is 32.6 mmHg.  3. Left atrial size was mildly dilated.  4. Right atrial size was mildly dilated.  5. The mitral valve is normal in structure. No evidence of mitral valve  regurgitation.  6. The aortic valve was not well visualized. Aortic valve regurgitation  is not visualized. No aortic stenosis is present.  7. There is mild dilatation of the ascending aorta, measuring  37 mm.  8. The inferior vena cava is dilated in size with <50% respiratory  variability, suggesting right atrial pressure of 15 mmHg.    08/26/2018: coronary CT IMPRESSION: 1. Coronary calcium score of 148. This was 31 percentile for age and sex matched control.  2. Normal coronary origin with right dominance.  3. The study is affected significantly by motion. However, in the visualized portions of coronary arteries there is only mild non-obstructive plaque. Aggressive risk factor modification is recommended  4. Dilated pulmonary artery measuring 34 mm suggestive of pulmonary hypertension.   04/19/2017: LHC  Normal coronary arteries.  Global hypokinesis, EF approximately 40%. LVEDP 18 mmHg.    Recent Labs: 12/25/2020: Magnesium 2.3 04/09/2021: Pro B Natriuretic peptide (BNP) 156.0 04/15/2021: ALT 18; B Natriuretic Peptide 345.2 04/16/2021: Hemoglobin 15.4; Platelets 170; TSH 1.147 05/14/2021: BUN 17; Creatinine, Ser 0.94; Potassium 3.9; Sodium 137  07/10/2020: LDL Cholesterol (Calc) 108 03/25/2021: Cholesterol 209; Direct LDL 150.0; HDL 45.20; Total CHOL/HDL Ratio 5; Triglycerides 216.0; VLDL 43.2   Estimated Creatinine Clearance: 98.9 mL/min (by C-G formula based on SCr of 0.94 mg/dL).   Wt Readings from Last 3  Encounters:  05/21/21 231 lb 12.8 oz (105.1 kg)  05/15/21 228 lb 8 oz (103.6 kg)  05/14/21 227 lb 9.6 oz (103.2 kg)     Other studies reviewed: Additional studies/records reviewed today include: summarized above  ASSESSMENT AND PLAN:  1. PPM      As above Will have him back in next week for device check with industry and provider as well Check VP% and if any improvement in his SOB (or not) Would like to try and avoid RV pacing is able  (note that his device implanted without plans for CRT pacing with suspect minimal RV pacing and LVEF by echo at the time of implant was 40-45% (consistantly for years)  2. AFib     This is unclear, monitor strips without clear Af     Monitoring via device     None to date by his pacer    3. NICM     GDMT has been limited by patient intolerances especially     Mentioned the metoprolol made his GI system burn from throat to anus.  Will hold off retrying BB for now.      Otherwise meds with HF team.      Disposition: F/u as above , sooner if needed.  Current medicines are reviewed at length with the patient today.  The patient did not have any concerns regarding medicines.  Venetia Night, PA-C 05/21/2021 12:57 PM     [address] [place] [address] [phone] (office)  [phone] (fax)

## 2021-05-20 ENCOUNTER — Telehealth: Payer: Self-pay | Admitting: Internal Medicine

## 2021-05-20 LAB — MULTIPLE MYELOMA PANEL, SERUM
Albumin SerPl Elph-Mcnc: 3.9 g/dL (ref 2.9–4.4)
Albumin/Glob SerPl: 1.6 (ref 0.7–1.7)
Alpha 1: 0.3 g/dL (ref 0.0–0.4)
Alpha2 Glob SerPl Elph-Mcnc: 0.6 g/dL (ref 0.4–1.0)
B-Globulin SerPl Elph-Mcnc: 1.1 g/dL (ref 0.7–1.3)
Gamma Glob SerPl Elph-Mcnc: 0.6 g/dL (ref 0.4–1.8)
Globulin, Total: 2.6 g/dL (ref 2.2–3.9)
IgA: 243 mg/dL (ref 61–437)
IgG (Immunoglobin G), Serum: 615 mg/dL (ref 603–1613)
IgM (Immunoglobulin M), Srm: 90 mg/dL (ref 20–172)
Total Protein ELP: 6.5 g/dL (ref 6.0–8.5)

## 2021-05-20 NOTE — Telephone Encounter (Signed)
Called patient and gave him Dr. Vena Rua recommendations. Patient had cancelled his appt. With [facility] on 05/05/21 because of his pacemaker. But he agrees to call them and reschedule

## 2021-05-20 NOTE — Telephone Encounter (Signed)
Would hold on MRI pelvis He was to see Dr. Dema Severin on 05/05/2021 for his history of perianal fistula--did he keep this appointment?  If so I would like to see the consult note for review.  If he did not he should reschedule this appointment and get surgical opinion rather than MRI pelvis (given pacemaker), EGD/colon remain recommended JMP

## 2021-05-20 NOTE — Telephone Encounter (Signed)
Inbound call from patient. Stated he missed his MRI because he was in the hospital and received a pacemaker 6 weeks ago. Questions about maybe trying a CT since the pacemaker. Best contact [phone]

## 2021-05-21 ENCOUNTER — Other Ambulatory Visit: Payer: Self-pay

## 2021-05-21 ENCOUNTER — Encounter: Payer: Self-pay | Admitting: Physician Assistant

## 2021-05-21 ENCOUNTER — Ambulatory Visit (INDEPENDENT_AMBULATORY_CARE_PROVIDER_SITE_OTHER): Payer: 59 | Admitting: Physician Assistant

## 2021-05-21 VITALS — BP 122/80 | HR 62 | Ht 70.0 in | Wt 231.8 lb

## 2021-05-21 DIAGNOSIS — I428 Other cardiomyopathies: Secondary | ICD-10-CM

## 2021-05-21 DIAGNOSIS — Z950 Presence of cardiac pacemaker: Secondary | ICD-10-CM | POA: Diagnosis not present

## 2021-05-21 DIAGNOSIS — I442 Atrioventricular block, complete: Secondary | ICD-10-CM

## 2021-05-21 DIAGNOSIS — I480 Paroxysmal atrial fibrillation: Secondary | ICD-10-CM

## 2021-05-21 DIAGNOSIS — I5042 Chronic combined systolic (congestive) and diastolic (congestive) heart failure: Secondary | ICD-10-CM

## 2021-05-21 NOTE — Patient Instructions (Signed)
Medication Instructions:   Your physician recommends that you continue on your current medications as directed. Please refer to the Current Medication list given to you today.   *If you need a refill on your cardiac medications before your next appointment, please call your pharmacy*   Lab Work: [place]   If you have labs (blood work) drawn today and your tests are completely normal, you will receive your results only by: Marland Kitchen MyChart Message (if you have MyChart) OR . A paper copy in the mail If you have any lab test that is abnormal or we need to change your treatment, we will call you to review the results.   Testing/Procedures: NONE ORDERED  TODAY   Follow-Up: At [facility], you and your health needs are our priority.  As part of our continuing mission to provide you with exceptional heart care, we have created designated Provider Care Teams.  These Care Teams include your primary Cardiologist (physician) and Advanced Practice Providers (APPs -  Physician Assistants and Nurse Practitioners) who all work together to provide you with the care you need, when you need it.  We recommend signing up for the patient portal called "MyChart".  Sign up information is provided on this After Visit Summary.  MyChart is used to connect with patients for Virtual Visits (Telemedicine).  Patients are able to view lab/test results, encounter notes, upcoming appointments, etc.  Non-urgent messages can be sent to your provider as well.   To learn more about what you can do with MyChart, go to [email or web address].    Your next appointment:    NEXT   WEEK WITH TILLERY  PER RENEE (WITH  [facility] [place] ON SITE)    [name] "[place]da [place], [place]    Other Instructions

## 2021-05-22 ENCOUNTER — Telehealth: Payer: Self-pay

## 2021-05-22 ENCOUNTER — Telehealth: Payer: Self-pay | Admitting: Neurology

## 2021-05-22 NOTE — Telephone Encounter (Signed)
Patient called in stating that he was seen in office and RU made changes to his PPM and he states he has been light headed ever since and it wont go away and he is concerned. Patient is sending in a transmission to see if it shows anything.

## 2021-05-22 NOTE — Telephone Encounter (Signed)
Spoke with pt.  He reports intermittent lightheaded spells since he was seen yesterday by Robert Church.    Manual transmission reviewed, Normal device function, no arrythmia logged.  The changes that Robert Church made in clinic appear to have been effective.  Discussed with Dr. Curt Church, he advised if pt feels worse than he did before device was implanted to change setting back.    Spoke with pt.  He reports that he feels better now than he did before implant.  He will give the new changes a couple of mor days to see if he starts to feel any better.  Stressed to patient he should not drive if he is having any kind of dizzy spells.  Also recommended pt check BP if he has any more dizzy spells.

## 2021-05-22 NOTE — Telephone Encounter (Signed)
CT cervical wo [facility] auth: [id number] exp 05/22/2021 - 06/20/2021 sent to GI CT lumbar wo [facility] auth: [id number] exp. 05/22/2021 - 06/20/2021 Sent to GI

## 2021-05-28 ENCOUNTER — Other Ambulatory Visit: Payer: Self-pay

## 2021-05-28 ENCOUNTER — Ambulatory Visit (INDEPENDENT_AMBULATORY_CARE_PROVIDER_SITE_OTHER): Payer: 59 | Admitting: Cardiovascular Disease

## 2021-05-28 ENCOUNTER — Encounter: Payer: Self-pay | Admitting: Cardiovascular Disease

## 2021-05-28 DIAGNOSIS — I428 Other cardiomyopathies: Secondary | ICD-10-CM

## 2021-05-28 DIAGNOSIS — G4733 Obstructive sleep apnea (adult) (pediatric): Secondary | ICD-10-CM | POA: Diagnosis not present

## 2021-05-28 DIAGNOSIS — I10 Essential (primary) hypertension: Secondary | ICD-10-CM

## 2021-05-28 DIAGNOSIS — E785 Hyperlipidemia, unspecified: Secondary | ICD-10-CM | POA: Diagnosis not present

## 2021-05-28 DIAGNOSIS — I442 Atrioventricular block, complete: Secondary | ICD-10-CM

## 2021-05-28 NOTE — Patient Instructions (Signed)
Medication Instructions:  Your physician recommends that you continue on your current medications as directed. Please refer to the Current Medication list given to you today.  *If you need a refill on your cardiac medications before your next appointment, please call your pharmacy*   Follow-Up: At [facility], you and your health needs are our priority.  As part of our continuing mission to provide you with exceptional heart care, we have created designated Provider Care Teams.  These Care Teams include your primary Cardiologist (physician) and Advanced Practice Providers (APPs -  Physician Assistants and Nurse Practitioners) who all work together to provide you with the care you need, when you need it.  We recommend signing up for the patient portal called "MyChart".  Sign up information is provided on this After Visit Summary.  MyChart is used to connect with patients for Virtual Visits (Telemedicine).  Patients are able to view lab/test results, encounter notes, upcoming appointments, etc.  Non-urgent messages can be sent to your provider as well.   To learn more about what you can do with MyChart, go to [email or web address].    Your next appointment:   6 month(s)  The format for your next appointment:   In Person  Provider:   You may see Quay Burow, MD or one of the following Advanced Practice Providers on your designated Care Team:    [place], PA-C  Coletta Memos, FNP    Other Instructions

## 2021-05-28 NOTE — Assessment & Plan Note (Signed)
History of nonischemic cardiomyopathy with echo performed 04/16/2021 revealing EF of 30 to 35% followed by Dr. Haroldine Laws.  He is on 60 mg of furosemide.  He was hospitalized in January for volume overload and was diuresed 60 pounds.  He weighs himself on a daily basis and is aware of salt restriction.

## 2021-05-28 NOTE — Assessment & Plan Note (Signed)
History of hyperlipidemia on Livalo with cholesterol level drawn 03/25/2021 revealing total cholesterol 209, LDL of 108 and HDL 45.

## 2021-05-28 NOTE — Assessment & Plan Note (Signed)
History of essential hypertension a blood pressure measured today at 128/70.  He is on losartan.

## 2021-05-28 NOTE — Assessment & Plan Note (Signed)
History of complete heart block with dizziness status post permanent transvenous pacemaker insertion by Dr. Curt Bears with resolution of his symptoms.

## 2021-05-28 NOTE — Progress Notes (Signed)
05/28/2021 Coda Mathey   Jan 05, 1956  [id number]  Primary Physician Janith Lima, MD Primary Cardiologist: [name] MD Lupe Carney, [place]  HPI:  Robert Church is a 65 y.o.  severely overweight divorced Caucasian male father of 1 grandchild who works in [place]. He was referred because of chest pain by Dr. Lisbeth Ply forcardiovascularevaluation.I last saw him in the office 01/03/2021 mother is at [facility] my long-term patients as well. He saw Dr. Johnsie Cancel in the past. His risk factors include ongoing tobacco abuse, hypertension and family history.Henever had a heart attack or stroke. He did have a heart catheterization performed by Dr. Tamala Julian on 04/19/2017 that showed normal coronary arteries with an EF of 40% consistent with a nonischemic cardia myopathy.A 2D echo performed 07/07/2018 showed an EF of 30 to 35%.  I referred him to Dr. Haroldine Laws in the advanced heart failure clinic who changed his carvedilol to Zebeta and his losartan to [facility]. Unfortunately, he did not tolerate the Entresto and he was changed back to losartan.Hedoes have obstructive sleep apnea on CPAP which he wears intermittently. He continues to exhibit class II/III symptoms.  He did well until 01/18/2020 when he was admitted with volume overload.  His 2D echo was unchanged with an EF of 40 to 45%.  He was diving 65 pounds from approximately 275 down to 210 and discharged home on torsemide and spironolactone.  He is aware of salt restriction.   He weighs himself on a daily basis.  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 Curt Bears 04/16/2021 with improvement in his symptoms.  He is followed by Dr. Haroldine Laws in the advanced heart failure clinic and is on furosemide 60 mg a day.  He does weigh himself.  He is aware of salt  restriction.  He currently is not on a beta-blocker.   Current Meds  Medication Sig  . albuterol (PROVENTIL) (2.5 MG/3ML) 0.083% nebulizer solution Take 3 mLs (2.5 mg total) by nebulization every 6 (six) hours as needed for wheezing or shortness of breath.  Marland Kitchen albuterol (VENTOLIN HFA) 108 (90 Base) MCG/ACT inhaler INHALE 2 PUFFS INTO THE LUNGS EVERY 6 HOURS AS NEEDED FOR WHEEZING  . allopurinol (ZYLOPRIM) 300 MG tablet Take 300 mg by mouth every other day.  . ALPRAZolam (XANAX) 0.25 MG tablet Take 0.75 mg by mouth at bedtime.  . furosemide (LASIX) 40 MG tablet Take 1.5 tablets (60 mg total) by mouth daily.  Marland Kitchen gabapentin (NEURONTIN) 100 MG capsule Take 1 capsule (100 mg total) by mouth 3 (three) times daily.  . Lidocaine, Anorectal, 5 % CREA Apply topically as needed.  Marland Kitchen losartan (COZAAR) 25 MG tablet Take 0.5 tablets (12.5 mg total) by mouth at bedtime.  . mometasone-formoterol (DULERA) 200-5 MCG/ACT AERO Inhale 2 puffs into the lungs 2 (two) times daily.  . montelukast (SINGULAIR) 10 MG tablet Take 1 tablet (10 mg total) by mouth at bedtime.  Marland Kitchen omeprazole (PRILOSEC) 20 MG capsule Take 1 capsule (20 mg total) by mouth 2 (two) times daily before a meal.  . Pitavastatin Calcium 2 MG TABS Take 1 tablet (2 mg total) by mouth daily.  . polyethylene glycol (MIRALAX / GLYCOLAX) 17 g packet Take 17 g by mouth daily as needed.  . predniSONE (DELTASONE) 10 MG tablet Take 1 tablet (10 mg total) by mouth daily with breakfast.  .  Tiotropium Bromide Monohydrate (SPIRIVA RESPIMAT) 2.5 MCG/ACT AERS Inhale 2.5 mcg into the lungs 2 (two) times daily.  . Zinc 100 MG TABS Take 1 tablet by mouth daily.     Allergies  Allergen Reactions  . Clindamycin Other (See Comments)    Tongue swelling  . Doxycycline     Severe stomach upset per patient  . E-Mycin [Erythromycin Base] Swelling  . Jardiance [Empagliflozin] Nausea Only and Other (See Comments)    Lightheadness Dizziness  . Penicillins Swelling and Other  (See Comments)    Childhood rxn--MD stated he "almost died" Has patient had a PCN reaction causing immediate rash, facial/tongue/throat swelling, SOB or lightheadedness with hypotension:Yes Has patient had a PCN reaction causing severe rash involving mucus membranes or skin necrosis:unsure Has patient had a PCN reaction that required hospitalization:unsure Has patient had a PCN reaction occurring within the last 10 years:No If all of the above answers are "NO", then may proceed with Cephalosporin use.    . Rosuvastatin     Other reaction(s): Myalgias (intolerance)    Social History   Socioeconomic History  . Marital status: Single    Spouse name: Not on file  . Number of children: 1  . Years of education: 17  . Highest education level: Not on file  Occupational History  . Occupation: disabled  Tobacco Use  . Smoking status: Current Every Day Smoker    Packs/day: 0.25    Years: 46.00    Pack years: 11.50    Types: Cigarettes  . Smokeless tobacco: Never Used  Vaping Use  . Vaping Use: Never used  Substance and Sexual Activity  . Alcohol use: Yes    Alcohol/week: 3.0 standard drinks    Types: 3 Cans of beer per week    Comment: 2 beers per day  . Drug use: No  . Sexual activity: Yes    Birth control/protection: None  Other Topics Concern  . Not on file  Social History Narrative   Right handed   Tea sometimes and coffee (1/2 caffeine)   Social Determinants of Health   Financial Resource Strain: Medium Risk  . Difficulty of Paying Living Expenses: Somewhat hard  Food Insecurity: No Food Insecurity  . Worried About Charity fundraiser in the Last Year: Never true  . Ran Out of Food in the Last Year: Never true  Transportation Needs: No Transportation Needs  . Lack of Transportation (Medical): No  . Lack of Transportation (Non-Medical): No  Physical Activity: Not on file  Stress: Not on file  Social Connections: Not on file  Intimate Partner Violence: Not on file      Review of Systems: General: negative for chills, fever, night sweats or weight changes.  Cardiovascular: negative for chest pain, dyspnea on exertion, edema, orthopnea, palpitations, paroxysmal nocturnal dyspnea or shortness of breath Dermatological: negative for rash Respiratory: negative for cough or wheezing Urologic: negative for hematuria Abdominal: negative for nausea, vomiting, diarrhea, bright red blood per rectum, melena, or hematemesis Neurologic: negative for visual changes, syncope, or dizziness All other systems reviewed and are otherwise negative except as noted above.    Blood pressure 128/70, pulse 84, height 5\' 10"  (1.778 m), weight 231 lb (104.8 kg).  General appearance: alert and no distress Neck: no adenopathy, no carotid bruit, no JVD, supple, symmetrical, trachea midline and thyroid not enlarged, symmetric, no tenderness/mass/nodules Lungs: clear to auscultation bilaterally Heart: regular rate and rhythm, S1, S2 normal, no murmur, click, rub or gallop Extremities: extremities normal,  atraumatic, no cyanosis or edema Pulses: 2+ and symmetric Skin: Skin color, texture, turgor normal. No rashes or lesions Neurologic: Alert and oriented X 3, normal strength and tone. Normal symmetric reflexes. Normal coordination and gait  EKG not performed today  ASSESSMENT AND PLAN:   HTN (hypertension) History of essential hypertension a blood pressure measured today at 128/70.  He is on losartan.  Obstructive sleep apnea syndrome History of obstructive sleep apnea on BiPAP  NICM (nonischemic cardiomyopathy) ([place]) History of nonischemic cardiomyopathy with echo performed 04/16/2021 revealing EF of 30 to 35% followed by Dr. Haroldine Laws.  He is on 60 mg of furosemide.  He was hospitalized in January for volume overload and was diuresed 60 pounds.  He weighs himself on a daily basis and is aware of salt restriction.  Hyperlipidemia LDL goal <70 History of hyperlipidemia on  Livalo with cholesterol level drawn 03/25/2021 revealing total cholesterol 209, LDL of 108 and HDL 45.  AV block, 3rd degree (HCC) History of complete heart block with dizziness status post permanent transvenous pacemaker insertion by Dr. Curt Bears with resolution of his symptoms.      [name] MD FACP,FACC,FAHA, [facility] 05/28/2021 3:44 PM

## 2021-05-28 NOTE — Assessment & Plan Note (Signed)
History of obstructive sleep apnea on BiPAP 

## 2021-05-28 NOTE — Progress Notes (Deleted)
Electrophysiology Office Note Date: 05/28/2021  ID:  Robert Church, DOB 03/25/1956, MRN [id number]  PCP: Robert Lima, MD Primary Cardiologist: Robert Burow, MD Electrophysiologist: Robert Haw, MD ***  CC: Pacemaker follow-up  Robert Church is a 65 y.o. male seen today for Robert Meredith Leeds, MD for {Blank single:19197::"cardiac clearance","post hospital follow up","acute visit due to ***","routine electrophysiology followup"}.  Since {Blank single:19197::"last being seen in our clinic","discharge from hospital"} the patient reports doing ***.  he denies chest pain, palpitations, dyspnea, PND, orthopnea, nausea, vomiting, dizziness, syncope, edema, weight gain, or early satiety.  Device History: Abbott dual chamber PPM implanted 04/16/21 Post-op day one, noted some tracking retrograde P waves, intermittently, no AFib. Deviece was programmed DDI to avoid this   Past Medical History:  Diagnosis Date  . Adenomatous colon polyp   . Allergy   . Anal fissure   . Asthma   . Bronchitis   . CHF (congestive heart failure) ([place])   . COPD (chronic obstructive pulmonary disease) ([place])   . Emphysema lung ([place])   . GERD (gastroesophageal reflux disease)   . Hyperlipidemia   . Hypertension   . OSA (obstructive sleep apnea)   . Pneumonia   . Sinusitis   . SOB (shortness of breath)    Past Surgical History:  Procedure Laterality Date  . LEFT HEART CATH AND CORONARY ANGIOGRAPHY N/A 04/19/2017   Procedure: Left Heart Cath and Coronary Angiography;  Surgeon: Belva Crome, MD;  Location: [place] [facility];  Service: Cardiovascular;  Laterality: N/A;  . PACEMAKER IMPLANT N/A 04/16/2021   Procedure: PACEMAKER IMPLANT;  Surgeon: Robert Haw, MD;  Location: [place] [facility];  Service: Cardiovascular;  Laterality: N/A;  . TONSILLECTOMY      Current Outpatient Medications  Medication Sig Dispense Refill  . albuterol (PROVENTIL) (2.5 MG/3ML) 0.083% nebulizer  solution Take 3 mLs (2.5 mg total) by nebulization every 6 (six) hours as needed for wheezing or shortness of breath. 75 mL 12  . albuterol (VENTOLIN HFA) 108 (90 Base) MCG/ACT inhaler INHALE 2 PUFFS INTO THE LUNGS EVERY 6 HOURS AS NEEDED FOR WHEEZING 18 g 2  . allopurinol (ZYLOPRIM) 300 MG tablet Take 300 mg by mouth every other day.    . ALPRAZolam (XANAX) 0.25 MG tablet Take 0.75 mg by mouth at bedtime.    . furosemide (LASIX) 40 MG tablet Take 1.5 tablets (60 mg total) by mouth daily. 45 tablet 3  . gabapentin (NEURONTIN) 100 MG capsule Take 1 capsule (100 mg total) by mouth 3 (three) times daily. 90 capsule 11  . Lidocaine, Anorectal, 5 % CREA Apply topically as needed.    Marland Kitchen losartan (COZAAR) 25 MG tablet Take 0.5 tablets (12.5 mg total) by mouth at bedtime. 15 tablet 3  . mometasone-formoterol (DULERA) 200-5 MCG/ACT AERO Inhale 2 puffs into the lungs 2 (two) times daily.    . montelukast (SINGULAIR) 10 MG tablet Take 1 tablet (10 mg total) by mouth at bedtime. 30 tablet 5  . omeprazole (PRILOSEC) 20 MG capsule Take 1 capsule (20 mg total) by mouth 2 (two) times daily before a meal. 180 capsule 1  . Pitavastatin Calcium 2 MG TABS Take 1 tablet (2 mg total) by mouth daily. 90 tablet 1  . polyethylene glycol (MIRALAX / GLYCOLAX) 17 g packet Take 17 g by mouth daily as needed. 14 each 0  . predniSONE (DELTASONE) 10 MG tablet Take 1 tablet (10 mg total) by mouth daily with breakfast.  30 tablet 5  . roflumilast (DALIRESP) 500 MCG TABS tablet Take 1 tablet (500 mcg total) by mouth daily. 30 tablet 5  . Tiotropium Bromide Monohydrate (SPIRIVA RESPIMAT) 2.5 MCG/ACT AERS Inhale 2.5 mcg into the lungs 2 (two) times daily. 4 g 11  . Zinc 100 MG TABS Take 1 tablet by mouth daily.     No current facility-administered medications for this visit.    Allergies:   Clindamycin, Doxycycline, E-mycin [erythromycin base], Jardiance [empagliflozin], Penicillins, and Rosuvastatin   Social History: Social  History   Socioeconomic History  . Marital status: Single    Spouse name: Not on file  . Number of children: 1  . Years of education: 106  . Highest education level: Not on file  Occupational History  . Occupation: disabled  Tobacco Use  . Smoking status: Current Every Day Smoker    Packs/day: 0.25    Years: 46.00    Pack years: 11.50    Types: Cigarettes  . Smokeless tobacco: Never Used  Vaping Use  . Vaping Use: Never used  Substance and Sexual Activity  . Alcohol use: Yes    Alcohol/week: 3.0 standard drinks    Types: 3 Cans of beer per week    Comment: 2 beers per day  . Drug use: No  . Sexual activity: Yes    Birth control/protection: None  Other Topics Concern  . Not on file  Social History Narrative   Right handed   Tea sometimes and coffee (1/2 caffeine)   Social Determinants of Health   Financial Resource Strain: Medium Risk  . Difficulty of Paying Living Expenses: Somewhat hard  Food Insecurity: No Food Insecurity  . Worried About Charity fundraiser in the Last Year: Never true  . Ran Out of Food in the Last Year: Never true  Transportation Needs: No Transportation Needs  . Lack of Transportation (Medical): No  . Lack of Transportation (Non-Medical): No  Physical Activity: Not on file  Stress: Not on file  Social Connections: Not on file  Intimate Partner Violence: Not on file    Family History: Family History  Problem Relation Age of Onset  . Lung cancer Father   . High blood pressure Mother   . Diabetes Maternal Grandmother   . Colon polyps Neg Hx   . Esophageal cancer Neg Hx   . Pancreatic cancer Neg Hx   . Stomach cancer Neg Hx      Review of Systems: All other systems reviewed and are otherwise negative except as noted above.  Physical Exam: There were no vitals filed for this visit.   GEN- The patient is well appearing, alert and oriented x 3 today.   HEENT: normocephalic, atraumatic; sclera clear, conjunctiva pink; hearing intact;  oropharynx clear; neck supple  Lungs- Clear to ausculation bilaterally, normal work of breathing.  No wheezes, rales, rhonchi Heart- Regular rate and rhythm, no murmurs, rubs or gallops  GI- soft, non-tender, non-distended, bowel sounds present  Extremities- no clubbing or cyanosis. No edema MS- no significant deformity or atrophy Skin- warm and dry, no rash or lesion; PPM pocket well healed Psych- euthymic mood, full affect Neuro- strength and sensation are intact  PPM Interrogation- reviewed in detail today,  See PACEART report  EKG:  EKG {ACTION; IS/IS [id number]} ordered today. The ekg ordered today shows ***  Recent Labs: 12/25/2020: Magnesium 2.3 04/09/2021: Pro B Natriuretic peptide (BNP) 156.0 04/15/2021: ALT 18; B Natriuretic Peptide 345.2 04/16/2021: Hemoglobin 15.4; Platelets 170; TSH 1.147  05/14/2021: BUN 17; Creatinine, Ser 0.94; Potassium 3.9; Sodium 137   Wt Readings from Last 3 Encounters:  05/21/21 231 lb 12.8 oz (105.1 kg)  05/15/21 228 lb 8 oz (103.6 kg)  05/14/21 227 lb 9.6 oz (103.2 kg)     Other studies Reviewed: Additional studies/ records that were reviewed today include: Previous EP office notes, Previous remote checks, Most recent labwork.   Assessment and Plan:  1. {Blank single:19197::"SND","CHB","Advanced AV block","Tachy-Brady syndrome","Sick sinus syndrome","Symptomatic bradycardia"} s/p {Blank single:19197::"Medtronic","St. Jude","Boston Scientific","Biotronik"} PPM  Normal PPM function See Pace Art report No changes today   Current medicines are reviewed at length with the patient today.   The patient {ACTIONS; HAS/DOES NOT HAVE:19233} concerns regarding his medicines.  The following changes were made today:  {NONE DEFAULTED:18576::"none"}  Labs/ tests ordered today include: *** No orders of the defined types were placed in this encounter.    Disposition:   Follow up with {Blank single:19197::"Dr. Allred","Dr. Arlan Organ. Klein","Dr.  Camnitz","Dr. Lambert","EP APP"} in *** {Blank single:19197::"Months","Weeks"}    Signed, Shirley Friar, PA-C  05/28/2021 8:21 AM  [facility] [address] [place]  [place] [address] [phone] (office) [phone] (fax)

## 2021-05-29 ENCOUNTER — Other Ambulatory Visit: Payer: Self-pay | Admitting: Internal Medicine

## 2021-05-29 ENCOUNTER — Encounter: Payer: 59 | Admitting: Student

## 2021-05-30 ENCOUNTER — Other Ambulatory Visit: Payer: Self-pay

## 2021-05-30 ENCOUNTER — Ambulatory Visit (INDEPENDENT_AMBULATORY_CARE_PROVIDER_SITE_OTHER): Payer: 59 | Admitting: Student

## 2021-05-30 ENCOUNTER — Encounter: Payer: Self-pay | Admitting: Student

## 2021-05-30 VITALS — BP 124/76 | HR 87 | Ht 70.0 in | Wt 231.8 lb

## 2021-05-30 DIAGNOSIS — I428 Other cardiomyopathies: Secondary | ICD-10-CM

## 2021-05-30 DIAGNOSIS — I5042 Chronic combined systolic (congestive) and diastolic (congestive) heart failure: Secondary | ICD-10-CM | POA: Diagnosis not present

## 2021-05-30 DIAGNOSIS — G4733 Obstructive sleep apnea (adult) (pediatric): Secondary | ICD-10-CM | POA: Diagnosis not present

## 2021-05-30 DIAGNOSIS — I442 Atrioventricular block, complete: Secondary | ICD-10-CM

## 2021-05-30 LAB — CUP PACEART INCLINIC DEVICE CHECK
Battery Remaining Longevity: 72 mo
Battery Voltage: 3.02 V
Brady Statistic RA Percent Paced: 18 %
Brady Statistic RV Percent Paced: 99 %
Date Time Interrogation Session: 20220610123521
Implantable Lead Implant Date: 20220427
Implantable Lead Implant Date: 20220427
Implantable Lead Location: 753859
Implantable Lead Location: 753860
Implantable Pulse Generator Implant Date: 20220427
Lead Channel Impedance Value: 600 Ohm
Lead Channel Impedance Value: 612.5 Ohm
Lead Channel Pacing Threshold Amplitude: 0.5 V
Lead Channel Pacing Threshold Amplitude: 0.5 V
Lead Channel Pacing Threshold Amplitude: 1 V
Lead Channel Pacing Threshold Amplitude: 1 V
Lead Channel Pacing Threshold Pulse Width: 0.5 ms
Lead Channel Pacing Threshold Pulse Width: 0.5 ms
Lead Channel Pacing Threshold Pulse Width: 0.5 ms
Lead Channel Pacing Threshold Pulse Width: 0.5 ms
Lead Channel Sensing Intrinsic Amplitude: 11 mV
Lead Channel Sensing Intrinsic Amplitude: 3.5 mV
Lead Channel Setting Pacing Amplitude: 3.5 V
Lead Channel Setting Pacing Amplitude: 3.5 V
Lead Channel Setting Pacing Pulse Width: 0.5 ms
Lead Channel Setting Sensing Sensitivity: 2 mV
Pulse Gen Model: 2272
Pulse Gen Serial Number: 3920156

## 2021-05-30 NOTE — Patient Instructions (Signed)
Medication Instructions:  Your physician recommends that you continue on your current medications as directed. Please refer to the Current Medication list given to you today.  *If you need a refill on your cardiac medications before your next appointment, please call your pharmacy*   Lab Work: None If you have labs (blood work) drawn today and your tests are completely normal, you will receive your results only by: [facility] (if you have MyChart) OR A paper copy in the mail If you have any lab test that is abnormal or we need to change your treatment, we will call you to review the results.   Follow-Up: At [facility], you and your health needs are our priority.  As part of our continuing mission to provide you with exceptional heart care, we have created designated Provider Care Teams.  These Care Teams include your primary Cardiologist (physician) and Advanced Practice Providers (APPs -  Physician Assistants and Nurse Practitioners) who all work together to provide you with the care you need, when you need it.  We recommend signing up for the patient portal called "MyChart".  Sign up information is provided on this After Visit Summary.  MyChart is used to connect with patients for Virtual Visits (Telemedicine).  Patients are able to view lab/test results, encounter notes, upcoming appointments, etc.  Non-urgent messages can be sent to your provider as well.   To learn more about what you can do with MyChart, go to [email or web address].    Your next appointment:   As scheduled

## 2021-05-30 NOTE — Progress Notes (Signed)
Electrophysiology Office Note Date: 05/30/2021  ID:  Robert Church, Robert Church 08-15-1956, MRN [id number]  PCP: Janith Lima, MD Primary Cardiologist: Quay Burow, MD Electrophysiologist: Constance Haw, MD   CC: Pacemaker follow-up  Robert Church is a 65 y.o. male seen today for Robert Meredith Leeds, MD for routine electrophysiology followup.    Seen last week in clinic for erratic heart rates. Presented in [place] w ARs and V pacing, underlying CHB in 44s. He was programmed DDD and appeared to track retrograde P waves at 130 bpm. PMT algorithm was successful in terminating but he felt very lightheaded. Programmed DDD 60/100 with PAV/SAV 200 and PVARP extended to 250 ms. This resulted in SR/VP with intermittent wenckebach. Ambulated without difficulty.  He presents today for re-assessment. He has been doing well since device changes. He continues to have intermittent dizziness. Interestingly, He ran out of his COPD medications and his dizziness stopped. He resumed it after a couple of days and the dizziness returned. He plans on prompt pulmonary follow up. Denies CP. Remains somewhat SOB with moderate exertion.   Device History: Abbott dual chamber PPM implanted 04/16/21 Post-op day one, noted some tracking retrograde P waves, intermittently, no AFib.  Device was programmed DDI to avoid this which ultimately resulted in patient feeling poorly.   AFib Hx Diagnosed April 2022   AAD Hx none to date  Past Medical History:  Diagnosis Date   Adenomatous colon polyp    Allergy    Anal fissure    Asthma    Bronchitis    CHF (congestive heart failure) (HCC)    COPD (chronic obstructive pulmonary disease) (HCC)    Emphysema lung (HCC)    GERD (gastroesophageal reflux disease)    Hyperlipidemia    Hypertension    OSA (obstructive sleep apnea)    Pneumonia    Sinusitis    SOB (shortness of breath)    Past Surgical History:  Procedure Laterality Date   LEFT HEART CATH  AND CORONARY ANGIOGRAPHY N/A 04/19/2017   Procedure: Left Heart Cath and Coronary Angiography;  Surgeon: Belva Crome, MD;  Location: [facility];  Service: Cardiovascular;  Laterality: N/A;   PACEMAKER IMPLANT N/A 04/16/2021   Procedure: PACEMAKER IMPLANT;  Surgeon: Constance Haw, MD;  Location: [facility];  Service: Cardiovascular;  Laterality: N/A;   TONSILLECTOMY      Current Outpatient Medications  Medication Sig Dispense Refill   albuterol (PROVENTIL) (2.5 MG/3ML) 0.083% nebulizer solution Take 3 mLs (2.5 mg total) by nebulization every 6 (six) hours as needed for wheezing or shortness of breath. 75 mL 12   albuterol (VENTOLIN HFA) 108 (90 Base) MCG/ACT inhaler INHALE 2 PUFFS INTO THE LUNGS EVERY 6 HOURS AS NEEDED FOR WHEEZING 18 g 2   allopurinol (ZYLOPRIM) 300 MG tablet TAKE 1 TABLET(300 MG) BY MOUTH DAILY 90 tablet 1   ALPRAZolam (XANAX) 0.25 MG tablet Take 0.75 mg by mouth at bedtime.     furosemide (LASIX) 40 MG tablet Take 1.5 tablets (60 mg total) by mouth daily. 45 tablet 3   gabapentin (NEURONTIN) 100 MG capsule Take 1 capsule (100 mg total) by mouth 3 (three) times daily. 90 capsule 11   Lidocaine, Anorectal, 5 % CREA Apply topically as needed.     losartan (COZAAR) 25 MG tablet Take 0.5 tablets (12.5 mg total) by mouth at bedtime. 15 tablet 3   mometasone-formoterol (DULERA) 200-5 MCG/ACT AERO Inhale 2 puffs into the lungs 2 (  two) times daily.     montelukast (SINGULAIR) 10 MG tablet Take 1 tablet (10 mg total) by mouth at bedtime. 30 tablet 5   omeprazole (PRILOSEC) 20 MG capsule Take 1 capsule (20 mg total) by mouth 2 (two) times daily before a meal. 180 capsule 1   Pitavastatin Calcium 2 MG TABS Take 1 tablet (2 mg total) by mouth daily. 90 tablet 1   polyethylene glycol (MIRALAX / GLYCOLAX) 17 g packet Take 17 g by mouth daily as needed. 14 each 0   predniSONE (DELTASONE) 10 MG tablet Take 1 tablet (10 mg total) by mouth daily with breakfast. 30 tablet  5   roflumilast (DALIRESP) 500 MCG TABS tablet Take 1 tablet (500 mcg total) by mouth daily. (Patient not taking: Reported on 05/28/2021) 30 tablet 5   Tiotropium Bromide Monohydrate (SPIRIVA RESPIMAT) 2.5 MCG/ACT AERS Inhale 2.5 mcg into the lungs 2 (two) times daily. 4 g 11   Zinc 100 MG TABS Take 1 tablet by mouth daily.     No current facility-administered medications for this visit.    Allergies:   Clindamycin, Doxycycline, E-mycin [erythromycin base], Jardiance [empagliflozin], Penicillins, and Rosuvastatin   Social History: Social History   Socioeconomic History   Marital status: Single    Spouse name: Not on file   Number of children: 1   Years of education: 12   Highest education level: Not on file  Occupational History   Occupation: disabled  Tobacco Use   Smoking status: Every Day    Packs/day: 0.25    Years: 46.00    Pack years: 11.50    Types: Cigarettes   Smokeless tobacco: Never  Vaping Use   Vaping Use: Never used  Substance and Sexual Activity   Alcohol use: Yes    Alcohol/week: 3.0 standard drinks    Types: 3 Cans of beer per week    Comment: 2 beers per day   Drug use: No   Sexual activity: Yes    Birth control/protection: None  Other Topics Concern   Not on file  Social History Narrative   Right handed   Tea sometimes and coffee (1/2 caffeine)   Social Determinants of Health   Financial Resource Strain: Medium Risk   Difficulty of Paying Living Expenses: Somewhat hard  Food Insecurity: No Food Insecurity   Worried About Charity fundraiser in the Last Year: Never true   Ran Out of Food in the Last Year: Never true  Transportation Needs: No Transportation Needs   Lack of Transportation (Medical): No   Lack of Transportation (Non-Medical): No  Physical Activity: Not on file  Stress: Not on file  Social Connections: Not on file  Intimate Partner Violence: Not on file    Family History: Family History  Problem Relation Age of Onset   Lung  cancer Father    High blood pressure Mother    Diabetes Maternal Grandmother    Colon polyps Neg Hx    Esophageal cancer Neg Hx    Pancreatic cancer Neg Hx    Stomach cancer Neg Hx      Review of Systems: All other systems reviewed and are otherwise negative except as noted above.  Physical Exam: There were no vitals filed for this visit.   GEN- The patient is well appearing, alert and oriented x 3 today.   HEENT: normocephalic, atraumatic; sclera clear, conjunctiva pink; hearing intact; oropharynx clear; neck supple  Lungs- Clear to ausculation bilaterally, normal work of breathing.  No  wheezes, rales, rhonchi Heart- Regular rate and rhythm, no murmurs, rubs or gallops  GI- soft, non-tender, non-distended, bowel sounds present  Extremities- no clubbing or cyanosis. No edema MS- no significant deformity or atrophy Skin- warm and dry, no rash or lesion; PPM pocket well healed Psych- euthymic mood, full affect Neuro- strength and sensation are intact  PPM Interrogation- reviewed in detail today,  See PACEART report  EKG:  EKG is not ordered today.   Recent Labs: 12/25/2020: Magnesium 2.3 04/09/2021: Pro B Natriuretic peptide (BNP) 156.0 04/15/2021: ALT 18; B Natriuretic Peptide 345.2 04/16/2021: Hemoglobin 15.4; Platelets 170; TSH 1.147 05/14/2021: BUN 17; Creatinine, Ser 0.94; Potassium 3.9; Sodium 137   Wt Readings from Last 3 Encounters:  05/28/21 231 lb (104.8 kg)  05/21/21 231 lb 12.8 oz (105.1 kg)  05/15/21 228 lb 8 oz (103.6 kg)     Other studies Reviewed: Additional studies/ records that were reviewed today include: Previous EP office notes, Previous remote checks, Most recent labwork.   Assessment and Plan:  1. CHB s/p [employer] Jude PPM  Normal PPM function See Pace Art report No changes today VP 99% today.  Device was implanted dual chamber with expected minimal RV pacing and LVEF by echo at time of implant was 40-45%.   2. ? Atrial fibrillation Previously  monitor strips without clear AF Continue to monitor via device. None noted today.  3. NICM Echo 04/16/21 LVEF 30-35%  GDMT has been limited by intolerances. BB has been limited by GI side effects. Followed by CHF team.  He is now V-Pacing 100%. May eventually need to consider BiV upgrade, but Pacing QRS is IVCD/RBBB pattern.  Current medicines are reviewed at length with the patient today.   The patient does not have concerns regarding his medicines.  The following changes were made today:  none  Labs/ tests ordered today include:  Orders Placed This Encounter  Procedures   CUP PACEART INCLINIC DEVICE CHECK   Disposition:   Follow up with Dr. Curt Bears as scheduled.     Robert Lefevre, Robert Church  05/30/2021 8:17 AM  [facility] [address] [place] [address] [phone] (office) [phone] (fax)

## 2021-06-02 ENCOUNTER — Other Ambulatory Visit: Payer: Self-pay

## 2021-06-02 ENCOUNTER — Telehealth: Payer: Self-pay | Admitting: Emergency Medicine

## 2021-06-02 NOTE — Progress Notes (Signed)
Erroneous encounter

## 2021-06-03 ENCOUNTER — Ambulatory Visit
Admission: RE | Admit: 2021-06-03 | Discharge: 2021-06-03 | Disposition: A | Discharge status: Home / Self Care | Payer: 59 | Source: Ambulatory Visit | Attending: Neurology | Admitting: Neurology

## 2021-06-03 ENCOUNTER — Other Ambulatory Visit: Payer: Self-pay

## 2021-06-03 ENCOUNTER — Telehealth: Payer: Self-pay | Admitting: Neurology

## 2021-06-03 ENCOUNTER — Ambulatory Visit
Admission: RE | Admit: 2021-06-03 | Discharge: 2021-06-03 | Disposition: A | Discharge status: Home / Self Care | Payer: Medicaid Other | Source: Ambulatory Visit | Attending: Neurology | Admitting: Neurology

## 2021-06-03 DIAGNOSIS — R202 Paresthesia of skin: Secondary | ICD-10-CM

## 2021-06-03 DIAGNOSIS — M542 Cervicalgia: Secondary | ICD-10-CM

## 2021-06-03 DIAGNOSIS — M545 Low back pain, unspecified: Secondary | ICD-10-CM

## 2021-06-03 DIAGNOSIS — G8929 Other chronic pain: Secondary | ICD-10-CM

## 2021-06-03 DIAGNOSIS — R269 Unspecified abnormalities of gait and mobility: Secondary | ICD-10-CM

## 2021-06-03 NOTE — Telephone Encounter (Signed)
Please call patient, CT of the cervical spine showed multilevel degenerative changes, central canal stenosis at C5-6 level, possible nerve root compression bilaterally at this level,  MRI of lumbar spine showed multilevel degenerative changes, variable degree of foraminal narrowing, most obvious at L4-5, L5-S1  He has pending EMG nerve conduction study on June 18, 2021, if he had significant worsening of his symptoms, may move up appointment, and even consider neurosurgical referral for CT myelogram of cervical spine, if there was no significant change since previous visit on May 15, 2021, may keep his EMG nerve conduction study appointment, will review imaging findings together   IMPRESSION: 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 Bilateral foraminal stenosis additionally at this level that could affect either C6 nerve.   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 No apparent compressive stenosis at this level.   Chronic facet fusion on the right at C3-4. Right foraminal narrowing that could affect the right C4 nerve.   C4-5: Disc bulge and facet osteoarthritis worse on the right. Mild right foraminal stenosis.   C6-7: Bilateral foraminal narrowing of a moderate degree that could possibly be symptomatic   IMPRESSION: No apparent compressive central canal stenosis.   Mild non-compressive disc bulges L2-3 and L3-4.   Facet osteoarthritis at L4-5 left worse than right. Left foraminal narrowing due to encroachment by bulging disc material could possibly affect the left L4 nerve.   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 No apparent compressive stenosis of the canal or lateral recesses. Mild to moderate foraminal narrowing on the right could possibly affect the exiting L5 nerve.

## 2021-06-04 MED ORDER — ALBUTEROL SULFATE HFA 108 (90 BASE) MCG/ACT IN AERS: INHALATION_SPRAY | RESPIRATORY_TRACT | 2 refills | Status: DC

## 2021-06-04 MED ORDER — ALBUTEROL SULFATE (2.5 MG/3ML) 0.083% IN NEBU: 2.5000 mg | INHALATION_SOLUTION | Freq: Four times a day (QID) | RESPIRATORY_TRACT | 12 refills | Status: DC | PRN

## 2021-06-04 MED ORDER — SPIRIVA RESPIMAT 2.5 MCG/ACT IN AERS
2.5000 ug | INHALATION_SPRAY | Freq: Two times a day (BID) | RESPIRATORY_TRACT | 11 refills | Status: DC
Start: 2021-06-04 — End: 2021-12-29

## 2021-06-04 MED ORDER — DULERA 200-5 MCG/ACT IN AERO
2.0000 | INHALATION_SPRAY | Freq: Two times a day (BID) | RESPIRATORY_TRACT | 3 refills | Status: DC
Start: 2021-06-04 — End: 2021-08-21

## 2021-06-04 NOTE — Telephone Encounter (Signed)
Called and spoke with pt letting him know that we can refill his albuterol inhaler for him and he verbalized understanding. While speaking with pt, he stated that he also needed to have his albuterol neb sol, dulera, and spiriva inhalers refilled so all have been sent to pharmacy for pt.   While speaking with pt, pt said that he has had a flare up as he has been mowing yards recently and due to this and being outside, he has had a cough that is worse in the mornings. States that he always wheezes but the wheezing is worse than usual.  Also states that he is very tight in the chest. States that he does take his 10mg  prednisone every day as prescribed. Also states that with the Bennett, he has had to cut the 584mcg in half and only take 297mcg due to him having dizzy spells with trying to take a full tab of the 59mcg  after recently having a pacemaker placed.  [name], please advise on pt's recent flare up and what you recommend to help. Thanks!

## 2021-06-04 NOTE — Telephone Encounter (Signed)
I spoke to the patient and he verbalized understanding of the results below. Reports good and bad days with back pain but says there is no overall worsening. He would like to keep his NCV/EMG on 06/18/21 for now. I told him to please call me back if his symptoms change prior to that date.

## 2021-06-04 NOTE — Telephone Encounter (Signed)
As him to increase his pred to 20mg  qd for 5 days and then go back to 10mg  qd

## 2021-06-04 NOTE — Telephone Encounter (Signed)
Pt stated that he spoke with his Pharmacy and stated that his albuterol (VENTOLIN HFA) 108 (90 Base) MCG/ACT inhaler was denied by [name] and he wanted to know why. Pharmacy; [facility] [place], [place]. Pls regard; [phone]

## 2021-06-04 NOTE — Telephone Encounter (Signed)
Spoke with the pt and notified him of response per Dr Lamonte Sakai. He verbalized understanding. Nothing further needed. Already has plenty on prednisone on hand. Will call if not improving for sooner ov.

## 2021-06-04 NOTE — Telephone Encounter (Signed)
Called patient but he did not answer. I left a message for him to call back. I do not see anywhere in his chart where his albuterol refill was denied.

## 2021-06-05 ENCOUNTER — Telehealth: Payer: Self-pay | Admitting: Internal Medicine

## 2021-06-05 NOTE — Telephone Encounter (Signed)
Pt would like to check his numbers since he is a diabetic and has issues with gout. He has been informed that he may need an OV. He also requested that a Rx for abx because he is having an issue with bronchitis from mowing yards. Please advise.

## 2021-06-05 NOTE — Telephone Encounter (Signed)
Patient is requesting lab orders for A1C and uric acid. Please advise

## 2021-06-06 ENCOUNTER — Telehealth: Payer: Self-pay | Admitting: Emergency Medicine

## 2021-06-06 MED ORDER — LEVOFLOXACIN 500 MG PO TABS: 500.0000 mg | ORAL_TABLET | Freq: Every day | ORAL | 0 refills | Status: DC

## 2021-06-06 NOTE — Telephone Encounter (Signed)
Called and spoke with patient who is calling in regarding his bronchitis. He mentioned he was told by [facility] to double up on prednisone. however its not working for him . He is needing some help onwhat to do next. Productive cough with white sputum and feels like his chest is tight. States that he gets bronchitis about 3 times a year and normally gets Levofloxacin to knock it out, states the doubling up on Prednisone is not working.   [name] please advise

## 2021-06-06 NOTE — Telephone Encounter (Signed)
Have him take levaquin 500mg  qd x 7 days

## 2021-06-06 NOTE — Telephone Encounter (Signed)
Called and spoke with patient. He is aware of RB's recommendations. Medication has been sent to his pharmacy. Nothing further needed at time of call.

## 2021-06-09 ENCOUNTER — Other Ambulatory Visit: Payer: Self-pay | Admitting: Internal Medicine

## 2021-06-09 DIAGNOSIS — M170 Bilateral primary osteoarthritis of knee: Secondary | ICD-10-CM

## 2021-06-10 ENCOUNTER — Encounter: Payer: 59 | Attending: Physical Medicine & Rehabilitation | Admitting: Physical Medicine & Rehabilitation

## 2021-06-16 ENCOUNTER — Ambulatory Visit: Payer: 59 | Admitting: Emergency Medicine

## 2021-06-18 ENCOUNTER — Encounter: Payer: 59 | Admitting: Neurology

## 2021-06-26 ENCOUNTER — Telehealth: Payer: Self-pay | Admitting: Internal Medicine

## 2021-06-30 ENCOUNTER — Other Ambulatory Visit: Payer: Self-pay

## 2021-06-30 ENCOUNTER — Ambulatory Visit (HOSPITAL_COMMUNITY)
Admission: RE | Admit: 2021-06-30 | Discharge: 2021-06-30 | Disposition: A | Discharge status: Home / Self Care | Payer: 59 | Source: Ambulatory Visit | Attending: Internal Medicine | Admitting: Internal Medicine

## 2021-06-30 VITALS — BP 140/64 | HR 93 | Ht 70.0 in | Wt 225.4 lb

## 2021-06-30 DIAGNOSIS — Z79899 Other long term (current) drug therapy: Secondary | ICD-10-CM | POA: Diagnosis not present

## 2021-06-30 DIAGNOSIS — I110 Hypertensive heart disease with heart failure: Secondary | ICD-10-CM | POA: Diagnosis not present

## 2021-06-30 DIAGNOSIS — L905 Scar conditions and fibrosis of skin: Secondary | ICD-10-CM | POA: Diagnosis not present

## 2021-06-30 DIAGNOSIS — Z09 Encounter for follow-up examination after completed treatment for conditions other than malignant neoplasm: Secondary | ICD-10-CM | POA: Insufficient documentation

## 2021-06-30 DIAGNOSIS — Z7952 Long term (current) use of systemic steroids: Secondary | ICD-10-CM | POA: Diagnosis not present

## 2021-06-30 DIAGNOSIS — Z7951 Long term (current) use of inhaled steroids: Secondary | ICD-10-CM | POA: Insufficient documentation

## 2021-06-30 DIAGNOSIS — G4733 Obstructive sleep apnea (adult) (pediatric): Secondary | ICD-10-CM | POA: Diagnosis not present

## 2021-06-30 DIAGNOSIS — Z6832 Body mass index (BMI) 32.0-32.9, adult: Secondary | ICD-10-CM | POA: Diagnosis not present

## 2021-06-30 DIAGNOSIS — I5042 Chronic combined systolic (congestive) and diastolic (congestive) heart failure: Secondary | ICD-10-CM | POA: Insufficient documentation

## 2021-06-30 DIAGNOSIS — M549 Dorsalgia, unspecified: Secondary | ICD-10-CM | POA: Diagnosis not present

## 2021-06-30 DIAGNOSIS — Z950 Presence of cardiac pacemaker: Secondary | ICD-10-CM | POA: Diagnosis not present

## 2021-06-30 DIAGNOSIS — Z87891 Personal history of nicotine dependence: Secondary | ICD-10-CM | POA: Diagnosis not present

## 2021-06-30 DIAGNOSIS — I10 Essential (primary) hypertension: Secondary | ICD-10-CM | POA: Diagnosis not present

## 2021-06-30 DIAGNOSIS — J449 Chronic obstructive pulmonary disease, unspecified: Secondary | ICD-10-CM | POA: Diagnosis not present

## 2021-06-30 DIAGNOSIS — R0602 Shortness of breath: Secondary | ICD-10-CM | POA: Diagnosis present

## 2021-06-30 DIAGNOSIS — I428 Other cardiomyopathies: Secondary | ICD-10-CM | POA: Diagnosis not present

## 2021-06-30 DIAGNOSIS — Z7901 Long term (current) use of anticoagulants: Secondary | ICD-10-CM | POA: Diagnosis not present

## 2021-06-30 DIAGNOSIS — R5382 Chronic fatigue, unspecified: Secondary | ICD-10-CM | POA: Diagnosis not present

## 2021-06-30 MED ORDER — SPIRONOLACTONE 25 MG PO TABS: 12.5000 mg | ORAL_TABLET | Freq: Every day | ORAL | 6 refills | Status: DC

## 2021-06-30 NOTE — Progress Notes (Signed)
ReDS Vest / Clip - 06/30/21 1400       ReDS Vest / Clip   Station Marker D    Ruler Value 32    ReDS Value Range Low volume    ReDS Actual Value 34

## 2021-06-30 NOTE — Patient Instructions (Signed)
Start Spironolactone 12.5 mg (1/2 tab) Daily AT BEDTIME  Your physician has requested that you have an echocardiogram. Echocardiography is a painless test that uses sound waves to create images of your heart. It provides your doctor with information about the size and shape of your heart and how well your heart's chambers and valves are working. This procedure takes approximately one hour. There are no restrictions for this procedure.  Please call our office in December to schedule your follow up appointment  If you have any questions or concerns before your next appointment please send [place] a message through [place] or call our office at [phone].    TO LEAVE A MESSAGE FOR THE NURSE SELECT OPTION 2, PLEASE LEAVE A MESSAGE INCLUDING: YOUR NAME DATE OF BIRTH CALL BACK NUMBER REASON FOR CALL**this is important as we prioritize the call backs  YOU WILL RECEIVE A CALL BACK THE SAME DAY AS LONG AS YOU CALL BEFORE 4:00 PM  At the [facility], you and your health needs are our priority. As part of our continuing mission to provide you with exceptional heart care, we have created designated Provider Care Teams. These Care Teams include your primary Cardiologist (physician) and Advanced Practice Providers (APPs- Physician Assistants and Nurse Practitioners) who all work together to provide you with the care you need, when you need it.   You may see any of the following providers on your designated Care Team at your next follow up: Dr Glori Bickers Dr Loralie Champagne Dr Patrice Paradise, NP Lyda Jester, [place] Ginnie Smart Audry Riles, PharmD   Please be sure to bring in all your medications bottles to every appointment.

## 2021-06-30 NOTE — Progress Notes (Signed)
ADVANCED HF CLINIC NOTE  MD: Dr. Haroldine Laws  Reason for Visit:  F/u for Chronic Systolic Heart Failure  HPI:  Robert Church is a 65 y/o male with h/o obesity, HTN, COPD with ongoing tobacco use and chronic systolic HF referred by Robert Church for further management of his HF.   He underwent cath in 4/18 due to CP. Cath showed normal coronaries with EF 40% and global HK. LVEDP 19.  He saw Robert Church on 07/01/2018 because of recurrent chest pain.  Myoview stress test performed 07/21/2018 showed EF 38% inferior scar with septal ischemia.  A 2D echo performed 07/07/2018 revealed an EF of 30 to 35%.  He was referred for cardiac CT.   Cardiac CT on 9/19.  Calcium score 148 (74th percentile)  LAD 25% otherwise normal cors. Dilated pulmonary arteries suggestive of PH.   Worked for [employer]. Retired 5/21. Follows with Robert Church in Pulmonary Clinic. Was started on bisoprolol 2.5 but couldn't tolerate due to fatigue. Was falling asleep at work. Stopped bisoprolol and felt better.   Echo 12/20 EF improved to 45%. Mild RV dysfunction   Admitted 1/22 for acute on chronic hypoxic respiratory failure secondary to acute COPD and CHF w/ volume overload. He had massive diuresis w/ IV Lasix, diuresed down from admit wt of 265 lb to 210 lb. Echo was repeated 12/21, EF 40-45% (unchanged). RV mildly reduced, RVSP 49.   Followed in clinic, Jardiance and Arlyce Harman added - dose adjusted due to intolerances. He then stopped these on his own.   In 4/22, had near syncope, Zio showed intermittent CHB. Repeat echo EF 30-35%% Had St Jude dual chamber PM 04/16/21. Had some PMT and device adjusted by EP   Here for f/u. Now on disability. Has had multiple visits for volume overload. Complains of chronic fatigue and SOB. ReDs 34%.Wears Bipap every night. No CP. Out mowing yards on riding mower.Occasional edema. Struggling with back pain. Can do short errands without too much problem.    Device interrogated today in clinic:   AP 15%, VP > 99% Occasional PMT    Cardiac studies:  Echo 06/2018 LVEF 30-35%, RV normal  Echo 12/2018 LVEF 40-45%, RV normal  Echo 11/2020 LVEF 40-45%, RV mildly reduced Echo 03/2021 30-35%, RV mildly reduced   Review of systems complete and Church to be negative unless listed in HPI.      Past Medical History:  Diagnosis Date   Adenomatous colon polyp    Allergy    Anal fissure    Asthma    Bronchitis    CHF (congestive heart failure) (HCC)    COPD (chronic obstructive pulmonary disease) (HCC)    Emphysema lung (HCC)    GERD (gastroesophageal reflux disease)    Hyperlipidemia    Hypertension    OSA (obstructive sleep apnea)    Pneumonia    Sinusitis    SOB (shortness of breath)     Current Outpatient Medications  Medication Sig Dispense Refill   albuterol (PROVENTIL) (2.5 MG/3ML) 0.083% nebulizer solution Take 3 mLs (2.5 mg total) by nebulization every 6 (six) hours as needed for wheezing or shortness of breath. 75 mL 12   albuterol (VENTOLIN HFA) 108 (90 Base) MCG/ACT inhaler INHALE 2 PUFFS INTO THE LUNGS EVERY 6 HOURS AS NEEDED FOR WHEEZING 18 g 2   ALPRAZolam (XANAX) 0.25 MG tablet Take 0.75 mg by mouth at bedtime.     DALIRESP 500 MCG TABS tablet Take 500 mcg by mouth daily.  EPINEPHrine 0.3 mg/0.3 mL IJ SOAJ injection Inject into the muscle.     furosemide (LASIX) 40 MG tablet Take 1.5 tablets (60 mg total) by mouth daily. 45 tablet 3   glucosamine-chondroitin 500-400 MG tablet Take 1 tablet by mouth daily in the afternoon.     Lidocaine, Anorectal, 5 % CREA Apply topically as needed.     losartan (COZAAR) 25 MG tablet Take 0.5 tablets (12.5 mg total) by mouth at bedtime. 15 tablet 3   mometasone-formoterol (DULERA) 200-5 MCG/ACT AERO Inhale 2 puffs into the lungs 2 (two) times daily. 13 g 3   montelukast (SINGULAIR) 10 MG tablet Take 1 tablet (10 mg total) by mouth at bedtime. 30 tablet 5   omeprazole (PRILOSEC) 20 MG capsule Take 1 capsule (20 mg total) by mouth  2 (two) times daily before a meal. 180 capsule 1   polyethylene glycol (MIRALAX / GLYCOLAX) 17 g packet Take 17 g by mouth daily as needed. 14 each 0   predniSONE (DELTASONE) 10 MG tablet Take 1 tablet (10 mg total) by mouth daily with breakfast. 30 tablet 5   Tiotropium Bromide Monohydrate (SPIRIVA RESPIMAT) 2.5 MCG/ACT AERS Inhale 2.5 mcg into the lungs 2 (two) times daily. 4 g 11   vitamin B-12 (CYANOCOBALAMIN) 1000 MCG tablet Take 1,000 mcg by mouth daily.     Zinc 100 MG TABS Take 1 tablet by mouth daily.     allopurinol (ZYLOPRIM) 300 MG tablet TAKE 1 TABLET(300 MG) BY MOUTH DAILY (Patient not taking: Reported on 06/30/2021) 90 tablet 1   gabapentin (NEURONTIN) 100 MG capsule Take 1 capsule (100 mg total) by mouth 3 (three) times daily. (Patient not taking: Reported on 06/30/2021) 90 capsule 11   Pitavastatin Calcium 2 MG TABS Take 1 tablet (2 mg total) by mouth daily. (Patient not taking: Reported on 06/30/2021) 90 tablet 1   No current facility-administered medications for this encounter.    Allergies  Allergen Reactions   Clindamycin Other (See Comments)    Tongue swelling   Doxycycline     Severe stomach upset per patient   E-Mycin [Erythromycin Base] Swelling   Jardiance [Empagliflozin] Nausea Only and Other (See Comments)    Lightheadness Dizziness   Penicillins Swelling and Other (See Comments)    Childhood rxn--MD stated he "almost died" Has patient had a PCN reaction causing immediate rash, facial/tongue/throat swelling, SOB or lightheadedness with hypotension:Yes Has patient had a PCN reaction causing severe rash involving mucus membranes or skin necrosis:unsure Has patient had a PCN reaction that required hospitalization:unsure Has patient had a PCN reaction occurring within the last 10 years:No If all of the above answers are "NO", then may proceed with Cephalosporin use.     Rosuvastatin     Other reaction(s): Myalgias (intolerance)      Social History    Socioeconomic History   Marital status: Single    Spouse name: Not on file   Number of children: 1   Years of education: 12   Highest education level: Not on file  Occupational History   Occupation: disabled  Tobacco Use   Smoking status: Former    Packs/day: 0.25    Years: 46.00    Pack years: 11.50    Types: Cigarettes    Quit date: 05/09/2021    Years since quitting: 0.1   Smokeless tobacco: Never  Vaping Use   Vaping Use: Never used  Substance and Sexual Activity   Alcohol use: Yes    Alcohol/week: 3.0 standard drinks  Types: 3 Cans of beer per week    Comment: 2 beers per day   Drug use: No   Sexual activity: Yes    Birth control/protection: None  Other Topics Concern   Not on file  Social History Narrative   Right handed   Tea sometimes and coffee (1/2 caffeine)   Social Determinants of Health   Financial Resource Strain: Medium Risk   Difficulty of Paying Living Expenses: Somewhat hard  Food Insecurity: No Food Insecurity   Worried About Charity fundraiser in the Last Year: Never true   Ran Out of Food in the Last Year: Never true  Transportation Needs: No Transportation Needs   Lack of Transportation (Medical): No   Lack of Transportation (Non-Medical): No  Physical Activity: Not on file  Stress: Not on file  Social Connections: Not on file  Intimate Partner Violence: Not on file      Family History  Problem Relation Age of Onset   Lung cancer Father    High blood pressure Mother    Diabetes Maternal Grandmother    Colon polyps Neg Hx    Esophageal cancer Neg Hx    Pancreatic cancer Neg Hx    Stomach cancer Neg Hx     Vitals:   06/30/21 1344  BP: 140/64  Pulse: 93  SpO2: 94%  Weight: 102.2 kg (225 lb 6.4 oz)  Height: 5' 10"  (1.778 m)    PHYSICAL EXAM: General:  Walking in clinic. No resp difficulty HEENT: normal Neck: supple. no JVD. Carotids 2+ bilat; no bruits. No lymphadenopathy or thryomegaly appreciated. Cor: PMI  nondisplaced. Regular rate & rhythm. No rubs, gallops or murmurs. Lungs: clear Abdomen: obese soft, nontender, nondistended. No hepatosplenomegaly. No bruits or masses. Good bowel sounds. Extremities: no cyanosis, clubbing, rash, trace edema Neuro: alert & orientedx3, cranial nerves grossly intact. moves all 4 extremities w/o difficulty. Affect pleasant    ASSESSMENT & PLAN:  1. Chronic systolic HF due to NICM  - cath 4/18 normal cors. EF 48%  - Myoview 07/21/2018 showed EF 38% inferior scar with septal ischemia.   - Echo 07/07/2018  EF 30-35%. RV normal.  - Cardiac CT 08/26/18.  Calcium score 148 (74th percentile)  LAD 25% otherwise normal cors. Dilated pulmonary arteries suggestive of PH.  - Suspect NICM due to HTN and inadequately treated OSA (He now reports improved nightly compliance w/ CPAP). - Echo 1/22 EF unchanged 40-45%. RV mildly reduced. RVSP 49 - Admit 4/22 for symptomatic bradycardia/CHF. Echo LVEF 30%. RV mildly reduced - Chronically NYHA Class III, confounded by back pain, COPD and obesity/ deconditioning  - Volume status looks good. On lasix 87m daily. Takes extra as needed - Intolerant to J[employer] Warner[employer] spiro (positional dizziness and nausea). Refuses retrial  - Continue Losartan 12.5 mg daily (failed Entresto due to dizziness/low BP) - Could not tolerate bisoprolol due to fatigue. Had dizziness w/ Coreg.  Had dry mouth w/ Toprol.  - Long talk about role of GDMT for LV recovery. He is willing to retry spiro 12.5 qhs.  - We also discussed possible Cardiomems to help better manage fluid status but he is not interested at this time.  - Given, CHB, biventricular dysfunction, moderate LVH and inability to tolerate HF meds due to orthostasis, I worry about infiltrative CM (sarcoid, amyloid). cMRI considered bu deferred  (device MRI compatible per EP but shadowing may make scan less diagnostic). Multiple myeloma panel negative. He does not want to do PYP - Will recheck  echo  - Pacer  interrogation showed >99% RV pacing and anly 15% AV pacing. D/w EP regarding possible upgrade to CRT if EF not improving   2. CHB: s/p PPM 4/22 - followed by Dr. Curt Bears  - has had some PMT. Seen by Robert July, Robert Church today in our clinic and device adjusted again. - plan as above   3. HTN - BP elevated here. Adding back spiro  4. COPD  -  has quit smoking, congratulated on efforts  -  Follows with Pulmonary (Robert Church) -  home O2 PRN -  continue BiPAP   5. OSA - reports nightly compliance w/ BiPAP  6. Morbid obesity  Body mass index is 32.34 kg/m. - wt loss advised   7. Pulmonary Nodules - followed by Robert Church - CT scan 3/22 stable, annual screening advised.   Total time spent 45 minutes. Over half that time spent discussing above.    Robert Bickers, MD  1:59 PM

## 2021-07-02 NOTE — Telephone Encounter (Signed)
   Patient is requesting a refill for  ALPRAZolam Duanne Moron) 0.25 MG tablet  [facility] [place], [place] AT [place] 09-08-21

## 2021-07-03 ENCOUNTER — Other Ambulatory Visit: Payer: Self-pay | Admitting: Internal Medicine

## 2021-07-03 DIAGNOSIS — K2100 Gastro-esophageal reflux disease with esophagitis, without bleeding: Secondary | ICD-10-CM

## 2021-07-03 NOTE — Telephone Encounter (Signed)
The denial reason listed is "refill not appropriate" please advise on the rationale so I can explain to the pt. Thanks!

## 2021-07-04 ENCOUNTER — Other Ambulatory Visit: Payer: Self-pay | Admitting: Internal Medicine

## 2021-07-04 DIAGNOSIS — F411 Generalized anxiety disorder: Secondary | ICD-10-CM

## 2021-07-04 MED ORDER — ALPRAZOLAM 0.25 MG PO TABS: 0.7500 mg | ORAL_TABLET | Freq: Two times a day (BID) | ORAL | 3 refills | Status: DC | PRN

## 2021-07-04 NOTE — Telephone Encounter (Signed)
Pt would like to know do you have another medication to offer. He stated the he does not want to do through withdraws since he has been on that medication for so many years. Please advise.

## 2021-07-08 ENCOUNTER — Other Ambulatory Visit: Payer: Self-pay

## 2021-07-09 ENCOUNTER — Telehealth: Payer: Self-pay

## 2021-07-09 NOTE — Telephone Encounter (Signed)
Successful telephone call to Robert Church per his request to discuss upcoming nerve conduction study scheduled for 08/13/21 at [facility]. Robert Church states he will have a full back nerve conduction test and is concerned it will interfere with his PPM. Patient is unsure if his neurologist is aware of his device. Patient will contact [facility] to update medical history and request pre-procedure device clearance form. Patient is appreciative of information. Of note he is scheduled for his 91 day post implant clinic visit/device interrogation 07/21/21 with Dr. Curt Bears.

## 2021-07-09 NOTE — Telephone Encounter (Signed)
The patient is having a nerve conduction test and wanted to know if it is okay to have it done with a pacemaker. His phone number is [phone].

## 2021-07-09 NOTE — Telephone Encounter (Signed)
(  Newest Message First) July 07, 2021     Benedetto Goad, RN   4:03 pm Note   Unsuccessful telephone encounter to patient to follow up on his question regarding need for nerve conduction study. Patient questioning if he is able to have study with PPM in place. Hip[employer] compliant VM message left requesting call back to [phone]. Of note discussed with device rep. Patient will need magnet placed over device if study is in close proximity to device, otherwise study will not interfere with device function and no precautions needed.

## 2021-07-11 ENCOUNTER — Telehealth: Payer: Self-pay | Admitting: Neurology

## 2021-07-11 NOTE — Telephone Encounter (Signed)
Pt states that the [facility] Clinic informed him that [facility] needs to reach out to them (at [phone]) re a clearance form before pt has his NCS/EMG due to pt having a pacemaker.  Please call Heart Clinic

## 2021-07-14 NOTE — Telephone Encounter (Signed)
The patient has been informed.

## 2021-07-14 NOTE — Telephone Encounter (Signed)
Please call patient, pacemaker will not affect EMG nerve conduction study.

## 2021-07-14 NOTE — Telephone Encounter (Signed)
I spoke to the patient. He was concerned about the NCV/EMG scheduled on 08/13/21. He had a pacemaker placed on 04/16/21. He has been informed there is no contraindication for him to have the procedure here.

## 2021-07-16 ENCOUNTER — Telehealth: Payer: Self-pay | Admitting: Emergency Medicine

## 2021-07-16 MED ORDER — PREDNISONE 20 MG PO TABS
40.0000 mg | ORAL_TABLET | Freq: Every day | ORAL | 0 refills | Status: DC
Start: 2021-07-16 — End: 2021-10-20

## 2021-07-16 NOTE — Telephone Encounter (Signed)
Garner Nash, DO  You 1 hour ago (2:24 PM)     Prednisone '40mg'$  X 5 days   Garner Nash, DO  [facility]  07/16/2021 2:24 PM   Called and spoke with pt letting him know recs stated by Dr. Valeta Harms. Pt verbalized understanding. Rx has been sent to preferred pharmacy. Nothing further needed.
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
## 2021-07-17 ENCOUNTER — Encounter: Payer: Self-pay | Admitting: Internal Medicine

## 2021-07-17 ENCOUNTER — Ambulatory Visit (HOSPITAL_COMMUNITY): Payer: 59

## 2021-07-17 ENCOUNTER — Telehealth: Payer: Self-pay | Admitting: Internal Medicine

## 2021-07-17 ENCOUNTER — Telehealth (INDEPENDENT_AMBULATORY_CARE_PROVIDER_SITE_OTHER): Payer: 59 | Admitting: Internal Medicine

## 2021-07-17 VITALS — BP 108/81 | HR 85 | Wt 223.0 lb

## 2021-07-17 DIAGNOSIS — U071 COVID-19: Secondary | ICD-10-CM | POA: Diagnosis not present

## 2021-07-17 MED ORDER — MOLNUPIRAVIR EUA 200MG CAPSULE: 4.0000 | ORAL_CAPSULE | Freq: Two times a day (BID) | ORAL | 0 refills | Status: AC

## 2021-07-17 NOTE — Telephone Encounter (Signed)
Seeking advice  Patient calling to report he is covid+, home test.7/27 Cough, congestion, body ache

## 2021-07-17 NOTE — Progress Notes (Signed)
Virtual Visit via Telephone Note  I connected with Robert Church on 07/17/21 at  1:30 PM EDT by telephone and verified that I am speaking with the correct person using two identifiers.   I discussed the limitations, risks, security and privacy concerns of performing an evaluation and management service by telephone and the availability of in person appointments. I also discussed with the patient that there may be a patient responsible charge related to this service. The patient expressed understanding and agreed to proceed.  Location patient: home Location provider: work office Participants present for the call: patient, provider Patient did not have a visit in the prior 7 days to address this/these issue(s).   History of Present Illness:  He follows routinely with Dr. Scarlette Calico.  He tested positive for COVID yesterday.  On Saturday he had an exposure.  On Sunday he started having cough that he describes as dry and hacking with a sore throat and itchy eyes.  On Monday he went to work but got progressively worse.  On Tuesday he did a home COVID test that was negative.  On Wednesday he developed extreme fatigue and again tested this time he was positive.  He had to call EMS yesterday due to shortness of breath.  When he was evaluated his oxygen level was 94% and they advised against hospital transport.  He does have a history of COPD, asthma congestive heart failure and a pacemaker.  He has not yet had any of his COVID vaccinations.   Observations/Objective: Patient sounds a little hoarse on the phone. I do not appreciate any increased work of breathing. Speech and thought processing are grossly intact. Patient reported vitals: None reported   Current Outpatient Medications:    albuterol (PROVENTIL) (2.5 MG/3ML) 0.083% nebulizer solution, Take 3 mLs (2.5 mg total) by nebulization every 6 (six) hours as needed for wheezing or shortness of breath., Disp: 75 mL, Rfl: 12   albuterol  (VENTOLIN HFA) 108 (90 Base) MCG/ACT inhaler, INHALE 2 PUFFS INTO THE LUNGS EVERY 6 HOURS AS NEEDED FOR WHEEZING, Disp: 18 g, Rfl: 2   allopurinol (ZYLOPRIM) 300 MG tablet, TAKE 1 TABLET(300 MG) BY MOUTH DAILY, Disp: 90 tablet, Rfl: 1   ALPRAZolam (XANAX) 0.25 MG tablet, Take 3 tablets (0.75 mg total) by mouth 2 (two) times daily as needed for anxiety., Disp: 60 tablet, Rfl: 3   DALIRESP 500 MCG TABS tablet, Take 500 mcg by mouth daily., Disp: , Rfl:    EPINEPHrine 0.3 mg/0.3 mL IJ SOAJ injection, Inject into the muscle., Disp: , Rfl:    furosemide (LASIX) 40 MG tablet, Take 1.5 tablets (60 mg total) by mouth daily., Disp: 45 tablet, Rfl: 3   gabapentin (NEURONTIN) 100 MG capsule, Take 1 capsule (100 mg total) by mouth 3 (three) times daily., Disp: 90 capsule, Rfl: 11   glucosamine-chondroitin 500-400 MG tablet, Take 1 tablet by mouth daily in the afternoon., Disp: , Rfl:    Lidocaine, Anorectal, 5 % CREA, Apply topically as needed., Disp: , Rfl:    losartan (COZAAR) 25 MG tablet, Take 0.5 tablets (12.5 mg total) by mouth at bedtime., Disp: 15 tablet, Rfl: 3   molnupiravir EUA 200 mg CAPS, Take 4 capsules (800 mg total) by mouth 2 (two) times daily for 5 days., Disp: 40 capsule, Rfl: 0   mometasone-formoterol (DULERA) 200-5 MCG/ACT AERO, Inhale 2 puffs into the lungs 2 (two) times daily., Disp: 13 g, Rfl: 3   montelukast (SINGULAIR) 10 MG tablet, Take  1 tablet (10 mg total) by mouth at bedtime., Disp: 30 tablet, Rfl: 5   omeprazole (PRILOSEC) 20 MG capsule, TAKE 1 CAPSULE(20 MG) BY MOUTH TWICE DAILY BEFORE A MEAL, Disp: 180 capsule, Rfl: 1   Pitavastatin Calcium 2 MG TABS, Take 1 tablet (2 mg total) by mouth daily., Disp: 90 tablet, Rfl: 1   polyethylene glycol (MIRALAX / GLYCOLAX) 17 g packet, Take 17 g by mouth daily as needed., Disp: 14 each, Rfl: 0   predniSONE (DELTASONE) 20 MG tablet, Take 2 tablets (40 mg total) by mouth daily with breakfast., Disp: 10 tablet, Rfl: 0   spironolactone  (ALDACTONE) 25 MG tablet, Take 0.5 tablets (12.5 mg total) by mouth at bedtime., Disp: 15 tablet, Rfl: 6   Tiotropium Bromide Monohydrate (SPIRIVA RESPIMAT) 2.5 MCG/ACT AERS, Inhale 2.5 mcg into the lungs 2 (two) times daily., Disp: 4 g, Rfl: 11   vitamin B-12 (CYANOCOBALAMIN) 1000 MCG tablet, Take 1,000 mcg by mouth daily., Disp: , Rfl:    Zinc 100 MG TABS, Take 1 tablet by mouth daily., Disp: , Rfl:    predniSONE (DELTASONE) 10 MG tablet, Take 1 tablet (10 mg total) by mouth daily with breakfast. (Patient not taking: Reported on 07/17/2021), Disp: 30 tablet, Rfl: 5  Review of Systems:  Constitutional: Denies fever, chills, diaphoresis, appetite change. HEENT: Denies photophobia, eye pain, redness, hearing loss,  mouth sores, trouble swallowing, neck pain, neck stiffness and tinnitus.   Respiratory: Denies chest tightness,  and wheezing.   Cardiovascular: Denies chest pain, palpitations and leg swelling.  Gastrointestinal: Denies nausea, vomiting, abdominal pain, diarrhea, constipation, blood in stool and abdominal distention.  Genitourinary: Denies dysuria, urgency, frequency, hematuria, flank pain and difficulty urinating.  Endocrine: Denies: hot or cold intolerance, sweats, changes in hair or nails, polyuria, polydipsia. Musculoskeletal: Denies myalgias, back pain, joint swelling, arthralgias and gait problem.  Skin: Denies pallor, rash and wound.  Neurological: Denies dizziness, seizures, syncope,  light-headedness, numbness and headaches.  Hematological: Denies adenopathy. Easy bruising, personal or family bleeding history  Psychiatric/Behavioral: Denies suicidal ideation, mood changes, confusion, nervousness, sleep disturbance and agitation   Assessment and Plan:  COVID-19  - Plan: molnupiravir EUA 200 mg CAPS -I think he would be a good candidate for antiviral treatment given his multiple comorbidities and onset of symptoms within 5 days. -Molnupiravir has been prescribed. -He has  been counseled on current quarantine guidelines. -He knows to reach out to [place] or to his PCP if symptoms worsen.    I discussed the assessment and treatment plan with the patient. The patient was provided an opportunity to ask questions and all were answered. The patient agreed with the plan and demonstrated an understanding of the instructions.   The patient was advised to call back or seek an in-person evaluation if the symptoms worsen or if the condition fails to improve as anticipated.  I provided 23 minutes of non-face-to-face time during this encounter.   Lelon Frohlich, MD [facility]

## 2021-07-18 ENCOUNTER — Ambulatory Visit (INDEPENDENT_AMBULATORY_CARE_PROVIDER_SITE_OTHER): Payer: 59

## 2021-07-18 DIAGNOSIS — I442 Atrioventricular block, complete: Secondary | ICD-10-CM | POA: Diagnosis not present

## 2021-07-19 LAB — CUP PACEART REMOTE DEVICE CHECK
Battery Remaining Longevity: 67 mo
Battery Remaining Percentage: 95.5 %
Battery Voltage: 2.99 V
Brady Statistic AP VP Percent: 15 %
Brady Statistic AP VS Percent: 1 %
Brady Statistic AS VP Percent: 84 %
Brady Statistic AS VS Percent: 1 %
Brady Statistic RA Percent Paced: 15 %
Brady Statistic RV Percent Paced: 99 %
Date Time Interrogation Session: 20220728040015
Implantable Lead Implant Date: 20220427
Implantable Lead Implant Date: 20220427
Implantable Lead Location: 753859
Implantable Lead Location: 753860
Implantable Pulse Generator Implant Date: 20220427
Lead Channel Impedance Value: 560 Ohm
Lead Channel Impedance Value: 600 Ohm
Lead Channel Pacing Threshold Amplitude: 0.5 V
Lead Channel Pacing Threshold Amplitude: 1 V
Lead Channel Pacing Threshold Pulse Width: 0.5 ms
Lead Channel Pacing Threshold Pulse Width: 0.5 ms
Lead Channel Sensing Intrinsic Amplitude: 3.5 mV
Lead Channel Sensing Intrinsic Amplitude: 9.7 mV
Lead Channel Setting Pacing Amplitude: 3.5 V
Lead Channel Setting Pacing Amplitude: 3.5 V
Lead Channel Setting Pacing Pulse Width: 0.5 ms
Lead Channel Setting Sensing Sensitivity: 2 mV
Pulse Gen Model: 2272
Pulse Gen Serial Number: 3920156

## 2021-07-20 ENCOUNTER — Telehealth: Payer: Self-pay | Admitting: Internal Medicine

## 2021-07-20 MED ORDER — LEVOFLOXACIN 500 MG PO TABS: 500.0000 mg | ORAL_TABLET | Freq: Every day | ORAL | 0 refills | Status: DC

## 2021-07-20 NOTE — Telephone Encounter (Signed)
Onset of covid symptoms 07/13/21 already received prednisone 7/27 from Dr Windell Norfolk and antiviral rx from PCP 07/17/21 cc yellow sputum production, listed as allergic to ees, doxy, and "almost died from pcn"   Levaquin 500 x 7 day   Go to ER if condition worsens

## 2021-07-21 ENCOUNTER — Encounter: Payer: 59 | Admitting: Cardiology

## 2021-07-21 ENCOUNTER — Telehealth: Payer: Self-pay | Admitting: Emergency Medicine

## 2021-07-21 DIAGNOSIS — J302 Other seasonal allergic rhinitis: Secondary | ICD-10-CM

## 2021-07-21 MED ORDER — FLUTICASONE PROPIONATE 50 MCG/ACT NA SUSP: 2.0000 | Freq: Every day | NASAL | 2 refills | Status: DC

## 2021-07-21 NOTE — Telephone Encounter (Signed)
Unfortunately I think we are doing everything we can. Have him continue what he is on. If he is worse in any way, I think he will need to go to the hospital, may need admission.

## 2021-07-21 NOTE — Telephone Encounter (Signed)
Spoke with the pt  He states started having cough, congestion, increased SOB, fatigue, low grade fever 07/13/21 He took 2 at home covid tests on 07/16/21 and they were both pos  Pt called PCP 07/17/21 and had molnupiravir called in, he is trying to take it but can only take 1 cap per day  He states it makes his mouth taste like metal and he keeps biting his tongue  He called in yesterday and we sent levaquin bc he was coughing up yellow sputum  He is also on prednisone 40 mg daily  Asking if there are any further recs as far as infusion  Please advise thanks

## 2021-07-21 NOTE — Telephone Encounter (Signed)
Called and spoke with pt letting him know the recs stated by [name] and he verbalized understanding. While speaking with pt, pt was requesting a refill of his fluticasone nasal spray as his current one was expired but he states it does work well for him when he needs it. Rx has been sent to preferred pharmacy. Nothing further needed.

## 2021-07-23 ENCOUNTER — Emergency Department (HOSPITAL_COMMUNITY): Payer: 59

## 2021-07-23 ENCOUNTER — Emergency Department (HOSPITAL_COMMUNITY)
Admission: EM | Admit: 2021-07-23 | Discharge: 2021-07-23 | Disposition: A | Discharge status: Home / Self Care | Payer: 59 | Attending: Emergency Medicine | Admitting: Emergency Medicine

## 2021-07-23 DIAGNOSIS — Z87891 Personal history of nicotine dependence: Secondary | ICD-10-CM | POA: Insufficient documentation

## 2021-07-23 DIAGNOSIS — J45909 Unspecified asthma, uncomplicated: Secondary | ICD-10-CM | POA: Diagnosis not present

## 2021-07-23 DIAGNOSIS — J9611 Chronic respiratory failure with hypoxia: Secondary | ICD-10-CM | POA: Insufficient documentation

## 2021-07-23 DIAGNOSIS — Z7951 Long term (current) use of inhaled steroids: Secondary | ICD-10-CM | POA: Diagnosis not present

## 2021-07-23 DIAGNOSIS — R600 Localized edema: Secondary | ICD-10-CM | POA: Diagnosis not present

## 2021-07-23 DIAGNOSIS — Z79899 Other long term (current) drug therapy: Secondary | ICD-10-CM | POA: Insufficient documentation

## 2021-07-23 DIAGNOSIS — Z950 Presence of cardiac pacemaker: Secondary | ICD-10-CM | POA: Insufficient documentation

## 2021-07-23 DIAGNOSIS — Z7982 Long term (current) use of aspirin: Secondary | ICD-10-CM | POA: Diagnosis not present

## 2021-07-23 DIAGNOSIS — I110 Hypertensive heart disease with heart failure: Secondary | ICD-10-CM | POA: Insufficient documentation

## 2021-07-23 DIAGNOSIS — U071 COVID-19: Secondary | ICD-10-CM | POA: Diagnosis not present

## 2021-07-23 DIAGNOSIS — J449 Chronic obstructive pulmonary disease, unspecified: Secondary | ICD-10-CM | POA: Insufficient documentation

## 2021-07-23 DIAGNOSIS — I5042 Chronic combined systolic (congestive) and diastolic (congestive) heart failure: Secondary | ICD-10-CM | POA: Diagnosis not present

## 2021-07-23 DIAGNOSIS — R0602 Shortness of breath: Secondary | ICD-10-CM | POA: Diagnosis present

## 2021-07-23 DIAGNOSIS — E119 Type 2 diabetes mellitus without complications: Secondary | ICD-10-CM | POA: Diagnosis not present

## 2021-07-23 LAB — COMPREHENSIVE METABOLIC PANEL
ALT: 23 U/L (ref 0–44)
AST: 35 U/L (ref 15–41)
Albumin: 4 g/dL (ref 3.5–5.0)
Alkaline Phosphatase: 47 U/L (ref 38–126)
Anion gap: 11 (ref 5–15)
BUN: 21 mg/dL (ref 8–23)
CO2: 28 mmol/L (ref 22–32)
Calcium: 9.1 mg/dL (ref 8.9–10.3)
Chloride: 98 mmol/L (ref 98–111)
Creatinine, Ser: 0.79 mg/dL (ref 0.61–1.24)
GFR, Estimated: >60 mL/min (ref >60)
Glucose, Bld: 108 mg/dL — ABNORMAL HIGH (ref 70–99)
Potassium: 3.3 mmol/L — ABNORMAL LOW (ref 3.5–5.1)
Sodium: 137 mmol/L (ref 135–145)
Total Bilirubin: 0.7 mg/dL (ref 0.3–1.2)
Total Protein: 7.1 g/dL (ref 6.5–8.1)

## 2021-07-23 LAB — CBC WITH DIFFERENTIAL/PLATELET
Abs Immature Granulocytes: 0.02 10*3/uL (ref 0.00–0.07)
Basophils Absolute: 0 10*3/uL (ref 0.0–0.1)
Basophils Relative: 0 %
Eosinophils Absolute: 0 10*3/uL (ref 0.0–0.5)
Eosinophils Relative: 0 %
HCT: 48.9 % (ref 39.0–52.0)
Hemoglobin: 16.6 g/dL (ref 13.0–17.0)
Immature Granulocytes: 0 %
Lymphocytes Relative: 15 %
Lymphs Abs: 0.9 10*3/uL (ref 0.7–4.0)
MCH: 33.2 pg (ref 26.0–34.0)
MCHC: 33.9 g/dL (ref 30.0–36.0)
MCV: 97.8 fL (ref 80.0–100.0)
Monocytes Absolute: 0.5 10*3/uL (ref 0.1–1.0)
Monocytes Relative: 8 %
Neutro Abs: 4.6 10*3/uL (ref 1.7–7.7)
Neutrophils Relative %: 77 %
Platelets: 154 10*3/uL (ref 150–400)
RBC: 5 MIL/uL (ref 4.22–5.81)
RDW: 12.4 % (ref 11.5–15.5)
WBC: 5.9 10*3/uL (ref 4.0–10.5)
nRBC: 0 % (ref 0.0–0.2)

## 2021-07-23 LAB — TROPONIN I (HIGH SENSITIVITY)
Troponin I (High Sensitivity): 25 ng/L — ABNORMAL HIGH (ref <18)
Troponin I (High Sensitivity): 28 ng/L — ABNORMAL HIGH (ref <18)

## 2021-07-23 MED ORDER — ALBUTEROL SULFATE HFA 108 (90 BASE) MCG/ACT IN AERS
6.0000 | INHALATION_SPRAY | Freq: Once | RESPIRATORY_TRACT | Status: AC
  Administered 2021-07-23: 6 via RESPIRATORY_TRACT
  Filled 2021-07-23: qty 6.7

## 2021-07-23 MED ORDER — DIPHENHYDRAMINE HCL 50 MG/ML IJ SOLN: 50.0000 mg | Freq: Once | INTRAMUSCULAR | Status: DC | PRN

## 2021-07-23 MED ORDER — SODIUM CHLORIDE 0.9 % IV SOLN: INTRAVENOUS | Status: DC | PRN

## 2021-07-23 MED ORDER — FAMOTIDINE IN NACL 20-0.9 MG/50ML-% IV SOLN: 20.0000 mg | Freq: Once | INTRAVENOUS | Status: DC | PRN

## 2021-07-23 MED ORDER — METHYLPREDNISOLONE SODIUM SUCC 125 MG IJ SOLR: 125.0000 mg | Freq: Once | INTRAMUSCULAR | Status: DC | PRN

## 2021-07-23 MED ORDER — BEBTELOVIMAB 175 MG/2 ML IV (EUA)
175.0000 mg | Freq: Once | INTRAMUSCULAR | Status: AC
  Administered 2021-07-23: 175 mg via INTRAVENOUS
  Filled 2021-07-23: qty 2

## 2021-07-23 MED ORDER — EPINEPHRINE 0.3 MG/0.3ML IJ SOAJ: 0.3000 mg | Freq: Once | INTRAMUSCULAR | Status: DC | PRN

## 2021-07-23 MED ORDER — ALBUTEROL SULFATE HFA 108 (90 BASE) MCG/ACT IN AERS: 2.0000 | INHALATION_SPRAY | Freq: Once | RESPIRATORY_TRACT | Status: DC | PRN

## 2021-07-23 NOTE — ED Notes (Signed)
Pt A&O x4. Attached to cardiac monitor x3. Sitting upright on side of ED stretcher. 3.5L Scissors O2 applied at present.  This nurse entered the pt's room to discharge him and he states he has no transportation home. [facility] to be paged d/t pt's O2 requirement and Covid positive status.

## 2021-07-23 NOTE — ED Triage Notes (Signed)
Pt BIB by EMS c/o SOB. Pt tested positive for COVID on 7/27. Per EMS, pts o2 sat was 89% on room upon arrival. Pt has home oxygen and wears it when needed. Pt o2 sat 94% on 4L.

## 2021-07-23 NOTE — Discharge Instructions (Addendum)
You are seen today for ongoing shortness of breath.  This is in the setting of COVID-19.  Given your respiratory failure, this is likely exacerbating your symptoms.  Continue steroids at home.  Use your oxygen as needed.  You were given monoclonal antibody while in the emergency department.  If you have any new or worsening symptoms, you should be reevaluated.

## 2021-07-23 NOTE — ED Notes (Signed)
Pt departed the dept via PT[facility].

## 2021-07-23 NOTE — ED Provider Notes (Signed)
[facility] DEPT Provider Note   CSN: [id number] Arrival date & time: 07/23/21  0236     History Chief Complaint  Patient presents with   Shortness of Breath    COVID + (07/16/21)    Robert Church is a 65 y.o. male.  HPI     Is a 65 year old male with history of CHF, COPD, hypertension, hyperlipidemia who presents with shortness of breath.  Patient reports that he tested positive on 7/27.  He started having symptoms the Monday prior to that.  He is on oxygen at home as needed up to 6 L.  Per EMS patient's O2 sats were 89% on room air upon arrival.  Patient reports worsening shortness of breath.  It is worse when he lies flat.  He is also noted productive cough and chest tightness.  Denies nausea or vomiting.  Denies fevers but does endorse chills.  He has been prescribed molnipiravur but states he is unable to tolerate this medication.  He is also on oral steroids currently.  Past Medical History:  Diagnosis Date   Adenomatous colon polyp    Allergy    Anal fissure    Asthma    Bronchitis    CHF (congestive heart failure) (HCC)    COPD (chronic obstructive pulmonary disease) (HCC)    Emphysema lung (HCC)    GERD (gastroesophageal reflux disease)    Hyperlipidemia    Hypertension    OSA (obstructive sleep apnea)    Pneumonia    Sinusitis    SOB (shortness of breath)     Patient Active Problem List   Diagnosis Date Noted   GAD (generalized anxiety disorder) 07/04/2021   Degenerative disc disease, cervical 04/21/2021   AV block, 3rd degree ([place]) 04/16/2021   Carotid artery calcification 04/09/2021   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   Atherosclerosis of aorta ([place]) 03/25/2021   Body mass index (BMI) 32.0-32.9, adult 01/15/2021   Lumbar spondylosis 01/10/2021   Foraminal stenosis of cervical region 01/10/2021   Chronic bilateral low back pain without sciatica 01/09/2021   Type II diabetes mellitus with manifestations ([place])  11/29/2020   Edema of lower extremity due to peripheral venous insufficiency 10/29/2020   Primary osteoarthritis of both knees 09/24/2020   Idiopathic chronic gout of foot without tophus 08/08/2020   Yellow jacket sting allergy 08/08/2020   Class 2 severe obesity due to excess calories with serious comorbidity and body mass index (BMI) of 38.0 to 38.9 in adult ([place]) 08/05/2020   Hyperlipidemia LDL goal <70 07/11/2020   Hypogonadism male 07/08/2020   Hypertriglyceridemia 07/08/2020   Routine general medical examination at a health care facility [id number]   Umbilical hernia without obstruction and without gangrene 07/08/2020   Chronic combined systolic and diastolic CHF (congestive heart failure) ([place]) 01/09/2020   Solitary pulmonary nodule 08/15/2019   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   NICM (nonischemic cardiomyopathy) ([place]) 12/16/2018   History of adenomatous polyp of colon 11/25/2018   Morbid obesity ([place]) 11/24/2015   Cigarette smoker 01/16/2015   COPD, group D, by GOLD 2017 classification ([place]) 03/03/2013   Obstructive sleep apnea syndrome 01/05/2013   Asbestos exposure 01/05/2013   Seasonal allergic rhinitis 10/06/2012   GERD (gastroesophageal reflux disease) 06/05/2012   Intrinsic asthma 07/23/2011   HTN (hypertension) 07/23/2011    Past Surgical History:  Procedure Laterality Date   LEFT HEART CATH AND CORONARY ANGIOGRAPHY N/A 04/19/2017   Procedure: Left Heart Cath and Coronary Angiography;  Surgeon: Belva Crome, MD;  Location: [facility];  Service: Cardiovascular;  Laterality: N/A;   PACEMAKER IMPLANT N/A 04/16/2021   Procedure: PACEMAKER IMPLANT;  Surgeon: Constance Haw, MD;  Location: [facility];  Service: Cardiovascular;  Laterality: N/A;   TONSILLECTOMY         Family History  Problem Relation Age of Onset   Lung cancer Father    High blood pressure Mother    Diabetes Maternal Grandmother    Colon polyps Neg Hx    Esophageal cancer Neg  Hx    Pancreatic cancer Neg Hx    Stomach cancer Neg Hx     Social History   Tobacco Use   Smoking status: Former    Packs/day: 0.25    Years: 46.00    Pack years: 11.50    Types: Cigarettes    Quit date: 05/09/2021    Years since quitting: 0.2   Smokeless tobacco: Never  Vaping Use   Vaping Use: Never used  Substance Use Topics   Alcohol use: Yes    Alcohol/week: 3.0 standard drinks    Types: 3 Cans of beer per week    Comment: 2 beers per day   Drug use: No    Home Medications Prior to Admission medications   Medication Sig Start Date End Date Taking? Authorizing Provider  albuterol (PROVENTIL) (2.5 MG/3ML) 0.083% nebulizer solution Take 3 mLs (2.5 mg total) by nebulization every 6 (six) hours as needed for wheezing or shortness of breath. 06/04/21  Yes Collene Gobble, MD  albuterol (VENTOLIN HFA) 108 (90 Base) MCG/ACT inhaler INHALE 2 PUFFS INTO THE LUNGS EVERY 6 HOURS AS NEEDED FOR WHEEZING 06/04/21  Yes Byrum, Rose Fillers, MD  allopurinol (ZYLOPRIM) 300 MG tablet TAKE 1 TABLET(300 MG) BY MOUTH DAILY Patient taking differently: Take 300 mg by mouth daily as needed. 05/29/21  Yes Janith Lima, MD  ALPRAZolam Duanne Moron) 0.25 MG tablet Take 3 tablets (0.75 mg total) by mouth 2 (two) times daily as needed for anxiety. 07/04/21  Yes Janith Lima, MD  aspirin EC 81 MG tablet Take 81 mg by mouth daily. Swallow whole.   Yes [provider]  DALIRESP 500 MCG TABS tablet Take 500 mcg by mouth daily. 1/2 tab in the morning, 1/2 table in the afternoon 06/09/21  Yes [provider]  EPINEPHrine 0.3 mg/0.3 mL IJ SOAJ injection Inject into the muscle. 06/27/21  Yes [provider]  fluticasone (FLONASE) 50 MCG/ACT nasal spray Place 2 sprays into both nostrils daily. 07/21/21  Yes Collene Gobble, MD  furosemide (LASIX) 40 MG tablet Take 1.5 tablets (60 mg total) by mouth daily. 04/25/21  Yes Clegg, Amy D, NP  gabapentin (NEURONTIN) 100 MG capsule Take 1 capsule (100 mg  total) by mouth 3 (three) times daily. Patient taking differently: Take 100 mg by mouth 3 (three) times daily as needed. 05/15/21  Yes Marcial Pacas, MD  glucosamine-chondroitin 500-400 MG tablet Take 1 tablet by mouth daily in the afternoon.   Yes [provider]  levofloxacin (LEVAQUIN) 500 MG tablet Take 1 tablet (500 mg total) by mouth daily. 07/20/21  Yes Tanda Rockers, MD  Lidocaine, Anorectal, 5 % CREA Apply topically as needed.   Yes [provider]  losartan (COZAAR) 25 MG tablet Take 0.5 tablets (12.5 mg total) by mouth at bedtime. 04/15/21  Yes Clegg, Amy D, NP  mometasone-formoterol (DULERA) 200-5 MCG/ACT AERO Inhale 2 puffs into the lungs 2 (two) times daily. 06/04/21  Yes  Collene Gobble, MD  montelukast (SINGULAIR) 10 MG tablet Take 1 tablet (10 mg total) by mouth at bedtime. 02/06/21  Yes Biagio Borg, MD  omeprazole (PRILOSEC) 20 MG capsule TAKE 1 CAPSULE(20 MG) BY MOUTH TWICE DAILY BEFORE A MEAL 07/03/21  Yes Janith Lima, MD  polyethylene glycol (MIRALAX / GLYCOLAX) 17 g packet Take 17 g by mouth daily as needed. 12/30/20  Yes Oretha Milch D, MD  predniSONE (DELTASONE) 10 MG tablet Take 1 tablet (10 mg total) by mouth daily with breakfast. Patient taking differently: Take 10 mg by mouth daily. 05/06/21  Yes Collene Gobble, MD  predniSONE (DELTASONE) 20 MG tablet Take 2 tablets (40 mg total) by mouth daily with breakfast. Patient taking differently: Take 20 mg by mouth daily with breakfast. 07/16/21  Yes Icard, Octavio Graves, DO  spironolactone (ALDACTONE) 25 MG tablet Take 0.5 tablets (12.5 mg total) by mouth at bedtime. 06/30/21  Yes Bensimhon, Shaune Pascal, MD  Tiotropium Bromide Monohydrate (SPIRIVA RESPIMAT) 2.5 MCG/ACT AERS Inhale 2.5 mcg into the lungs 2 (two) times daily. 06/04/21  Yes Collene Gobble, MD  torsemide (DEMADEX) 10 MG tablet Take 10 mg by mouth daily. 07/02/21  Yes [provider]  vitamin B-12 (CYANOCOBALAMIN) 1000 MCG tablet Take 1,000 mcg by  mouth daily.   Yes [provider]  Zinc 100 MG TABS Take 1 tablet by mouth daily.   Yes [provider]  Pitavastatin Calcium 2 MG TABS Take 1 tablet (2 mg total) by mouth daily. Patient not taking: Reported on 07/23/2021 05/10/21   Janith Lima, MD    Allergies    Clindamycin, Doxycycline, E-mycin [erythromycin base], Jardiance [empagliflozin], Penicillins, and Rosuvastatin  Review of Systems   Review of Systems  Constitutional:  Negative for fever.  Respiratory:  Positive for cough, chest tightness, shortness of breath and wheezing.   Cardiovascular:  Negative for chest pain and leg swelling.  Gastrointestinal:  Negative for abdominal pain, nausea and vomiting.  All other systems reviewed and are negative.  Physical Exam Updated Vital Signs BP (!) 145/94   Pulse 93   Temp 99 F (37.2 C) (Oral)   Resp 18   Ht 1.778 m ('5\' 10"'$ )   Wt 103 kg   SpO2 92%   BMI 32.57 kg/m   Physical Exam Vitals and nursing note reviewed.  Constitutional:      Appearance: He is well-developed. He is obese.     Comments: Chronically ill-appearing but nontoxic  HENT:     Head: Normocephalic and atraumatic.     Mouth/Throat:     Mouth: Mucous membranes are moist.  Eyes:     Pupils: Pupils are equal, round, and reactive to light.  Cardiovascular:     Rate and Rhythm: Normal rate and regular rhythm.     Heart sounds: Normal heart sounds. No murmur heard. Pulmonary:     Effort: Pulmonary effort is normal. Tachypnea present. No respiratory distress.     Breath sounds: Normal breath sounds. No wheezing.     Comments: Nasal cannula in place, slight tachypnea noted, expiratory wheezing noted Abdominal:     General: Bowel sounds are normal.     Palpations: Abdomen is soft.     Tenderness: There is no abdominal tenderness. There is no rebound.  Musculoskeletal:     Cervical back: Neck supple.     Right lower leg: Edema present.     Left lower leg: Edema present.   Lymphadenopathy:  Cervical: No cervical adenopathy.  Skin:    General: Skin is warm and dry.  Neurological:     Mental Status: He is alert and oriented to person, place, and time.  Psychiatric:        Mood and Affect: Mood normal.    ED Results / Procedures / Treatments   Labs (all labs ordered are listed, but only abnormal results are displayed) Labs Reviewed  COMPREHENSIVE METABOLIC PANEL - Abnormal; Notable for the following components:      Result Value   Potassium 3.3 (*)    Glucose, Bld 108 (*)    All other components within normal limits  TROPONIN I (HIGH SENSITIVITY) - Abnormal; Notable for the following components:   Troponin I (High Sensitivity) 25 (*)    All other components within normal limits  TROPONIN I (HIGH SENSITIVITY) - Abnormal; Notable for the following components:   Troponin I (High Sensitivity) 28 (*)    All other components within normal limits  CBC WITH DIFFERENTIAL/PLATELET    EKG EKG Interpretation  Date/Time:  Wednesday July 23 2021 02:56:27 EDT Ventricular Rate:  85 PR Interval:    QRS Duration: 200 QT Interval:  476 QTC Calculation: 567 R Axis:   -85 Text Interpretation: Irregular rhythm, paced complexes LVH with IVCD, LAD and secondary repol abnrm Prolonged QT interval Confirmed by Thayer Jew [phone]) on 07/23/2021 6:42:15 AM  Radiology DG Chest Portable 1 View  Result Date: 07/23/2021 CLINICAL DATA:  COVID positive with shortness of breath and cough x3 days. EXAM: PORTABLE CHEST 1 VIEW COMPARISON:  April 17, 2021 FINDINGS: A dual lead AICD is in place. Mild atelectasis and/or infiltrate is seen within the lateral aspect of the right lung base. There is no evidence of a pleural effusion or pneumothorax. The cardiac silhouette is mildly enlarged and unchanged in size. The visualized skeletal structures are unremarkable. IMPRESSION: Stable cardiomegaly with mild right basilar atelectasis and/or infiltrate. Electronically Signed   By:  Virgina Norfolk M.D.   On: 07/23/2021 03:41    Procedures .Critical Care  Date/Time: 07/23/2021 6:58 AM Performed by: Merryl Hacker, MD Authorized by: Merryl Hacker, MD   Critical care provider statement:    Critical care time (minutes):  45   Critical care was time spent personally by me on the following activities:  Discussions with consultants, evaluation of patient's response to treatment, examination of patient, ordering and performing treatments and interventions, ordering and review of laboratory studies, ordering and review of radiographic studies, pulse oximetry, re-evaluation of patient's condition, obtaining history from patient or surrogate and review of old charts   Medications Ordered in ED Medications  0.9 %  sodium chloride infusion (has no administration in time range)  diphenhydrAMINE (BENADRYL) injection 50 mg (has no administration in time range)  famotidine (PEPCID) IVPB 20 mg premix (has no administration in time range)  methylPREDNISolone sodium succinate (SOLU-MEDROL) 125 mg/2 mL injection 125 mg (has no administration in time range)  albuterol (VENTOLIN HFA) 108 (90 Base) MCG/ACT inhaler 2 puff (has no administration in time range)  EPINEPHrine (EPI-PEN) injection 0.3 mg (has no administration in time range)  albuterol (VENTOLIN HFA) 108 (90 Base) MCG/ACT inhaler 6 puff (6 puffs Inhalation Given 07/23/21 0445)  bebtelovimab EUA injection SOLN 175 mg (175 mg Intravenous Given 07/23/21 0620)    ED Course  I have reviewed the triage vital signs and the nursing notes.  Pertinent labs & imaging results that were available during my care of the patient were  reviewed by me and considered in my medical decision making (see chart for details).    MDM Rules/Calculators/A&P                           Patient presents with shortness of breath, cough, fever.  This is in the setting of positive COVID-19 testing 1 week ago.  He has chronic respiratory failure from  COPD and emphysema.  He is on oxygen as needed at home.  O2 sats high 80s on room air.  He was placed on supplemental oxygen with some improvement of his symptoms.  Chest x-ray is largely reassuring.  There is some question of atelectasis versus infiltrate; however, no changes suggestive of diffuse COVID-19.  CBC without leukocytosis.  No significant metabolic derangements.  EKG without ischemic or arrhythmic changes.  Patient is currently on 30 mg of prednisone.  At baseline he is usually on 10.  He is tapering down.  Recommend that he stay on this.  He was given albuterol inhaler.  He is not tolerating outpatient treatment for his COVID.  For this reason, feel he is a good candidate for monoclonal antibody therapy.  This was provided for him.  Given that he has oxygen at home and is not requiring more than his baseline, do not feel he needs admission to the hospital.  Patient is agreeable to plan.  We will follow-up with his pulmonologist and primary physician.  After history, exam, and medical workup I feel the patient has been appropriately medically screened and is safe for discharge home. Pertinent diagnoses were discussed with the patient. Patient was given return precautions.  Final Clinical Impression(s) / ED Diagnoses Final diagnoses:  COVID-19  Chronic respiratory failure with hypoxia [facility])    Rx / DC Orders ED Discharge Orders     None        Shaylin Blatt, Barbette Hair, MD 07/23/21 0700

## 2021-07-24 ENCOUNTER — Ambulatory Visit: Payer: Medicaid Other | Admitting: Emergency Medicine

## 2021-07-25 ENCOUNTER — Telehealth: Payer: Self-pay

## 2021-07-25 ENCOUNTER — Encounter: Payer: 59 | Admitting: Physical Medicine & Rehabilitation

## 2021-07-25 NOTE — Telephone Encounter (Signed)
The patient states his blood pressure is 121/69 bpm and his heart rate is 62 bpm. He states he has been feeling dizzy lately. I asked the patient to send a manual transmission for the nurse can review. Transmission received. I told him the nurse will review it and give him a call back. His phone number is [phone].

## 2021-07-25 NOTE — Telephone Encounter (Signed)
Spoke with pt, advised device transmission does not show any heart rate issues.  Pt reporting that he is recovering from COVID infection.  He will continue to monitor and notify MD if does not improve.

## 2021-07-31 ENCOUNTER — Telehealth: Payer: Self-pay

## 2021-07-31 ENCOUNTER — Other Ambulatory Visit (HOSPITAL_COMMUNITY): Payer: Self-pay

## 2021-07-31 ENCOUNTER — Other Ambulatory Visit: Payer: Self-pay

## 2021-07-31 ENCOUNTER — Ambulatory Visit (HOSPITAL_COMMUNITY)
Admission: RE | Admit: 2021-07-31 | Discharge: 2021-07-31 | Disposition: A | Discharge status: Home / Self Care | Payer: 59 | Source: Ambulatory Visit | Attending: Internal Medicine | Admitting: Internal Medicine

## 2021-07-31 DIAGNOSIS — J449 Chronic obstructive pulmonary disease, unspecified: Secondary | ICD-10-CM | POA: Insufficient documentation

## 2021-07-31 DIAGNOSIS — I5042 Chronic combined systolic (congestive) and diastolic (congestive) heart failure: Secondary | ICD-10-CM | POA: Insufficient documentation

## 2021-07-31 DIAGNOSIS — I110 Hypertensive heart disease with heart failure: Secondary | ICD-10-CM | POA: Insufficient documentation

## 2021-07-31 DIAGNOSIS — E785 Hyperlipidemia, unspecified: Secondary | ICD-10-CM | POA: Insufficient documentation

## 2021-07-31 LAB — ECHOCARDIOGRAM COMPLETE
AR max vel: 4.08 cm2
AV Area VTI: 3.96 cm2
AV Area mean vel: 3.36 cm2
AV Mean grad: 4 mmHg
AV Peak grad: 6.8 mmHg
Ao pk vel: 1.3 m/s
Calc EF: 19.9 %
S' Lateral: 4.3 cm
Single Plane A2C EF: 11.1 %
Single Plane A4C EF: 33.5 %

## 2021-07-31 MED ORDER — FUROSEMIDE 40 MG PO TABS: 60.0000 mg | ORAL_TABLET | Freq: Every day | ORAL | 3 refills | Status: DC

## 2021-07-31 NOTE — Telephone Encounter (Signed)
Prior Authorization initiated for Dulera 200-5 mcg  Through Covermymeds  Key: [id number]

## 2021-07-31 NOTE — Progress Notes (Signed)
  Echocardiogram 2D Echocardiogram has been performed.  Robert Church 07/31/2021, 3:45 PM

## 2021-08-01 ENCOUNTER — Telehealth (HOSPITAL_COMMUNITY): Payer: Self-pay | Admitting: Internal Medicine

## 2021-08-01 NOTE — Telephone Encounter (Signed)
Pt called for echo results, please advise

## 2021-08-05 NOTE — Telephone Encounter (Signed)
Called and spoke with [facility]. I printed off Prior Authorization form and sent into [employer].  Patient is aware.

## 2021-08-06 ENCOUNTER — Telehealth: Payer: Self-pay | Admitting: Emergency Medicine

## 2021-08-06 NOTE — Telephone Encounter (Addendum)
Krystoff from [facility] stated that the PA form that they received yesterday for the [facility] 200 5 MG they stated it is no procedure code on the form and the service of the facility is also missing on the form and they are needing for that information to be updated and resent to them. Fax number; [phone].   Pls regard; [phone]

## 2021-08-06 NOTE — Telephone Encounter (Signed)
Faxed paper stating tried and failed inhalers to [facility] at their request. Will await response.

## 2021-08-07 ENCOUNTER — Telehealth: Payer: Self-pay | Admitting: Emergency Medicine

## 2021-08-07 MED ORDER — LEVOFLOXACIN 500 MG PO TABS: 500.0000 mg | ORAL_TABLET | Freq: Every day | ORAL | 0 refills | Status: DC

## 2021-08-07 MED ORDER — FLUTICASONE-SALMETEROL 250-50 MCG/ACT IN AEPB: 1.0000 | INHALATION_SPRAY | Freq: Two times a day (BID) | RESPIRATORY_TRACT | 0 refills | Status: DC

## 2021-08-07 MED ORDER — AZITHROMYCIN 250 MG PO TABS: ORAL_TABLET | ORAL | 0 refills | Status: DC

## 2021-08-07 NOTE — Telephone Encounter (Signed)
Called and spoke with patient, provided recommendations per Dr. Annamaria Boots.  Patient clarified that he has not been on his Levofloxacin in 2 weeks.  He had started feeling better and then it came right back.  Advised I would send the Advair inhaler to his pharmacy.  Dr. Annamaria Boots, Patient clarified that he has not been on the Levofloxacin in 2 weeks.  Sorry for the confusion.  Please advise.  Thank you.  Spoke with Dr. Annamaria Boots and he advised to just refill the Levofloxacin 500 mg.  Called patient and advised of recommendations, he verbalized understanding.  Script sent to pharmacy.  Nothing further needed.

## 2021-08-07 NOTE — Telephone Encounter (Signed)
Pt stated that he is very congested in his head and his chest, pt stated that he is coughing up mucus sometimes it is white and other times it is yellow and he said that after waking up in the mornings from using his BIPAP has been hard on his breathing.  Pharmacy; [facility] [id number]  [place], [place] [phone]   Pls regard; [phone]

## 2021-08-07 NOTE — Addendum Note (Signed)
Addended by: Vanessa Barbara on: 08/07/2021 03:37 PM   Modules accepted: Orders

## 2021-08-07 NOTE — Telephone Encounter (Signed)
Called and spoke with patient. He stated that he has been dealing with increased head and chest congestion since he tested positive for covid on 07/27. His nasal discharge ranges from clear to yellow at times. He has also been coughing up thick yellow phlegm. His SOB has been stable but he has been wheezing more. Denied any fevers, body aches or being anyone who has been sick recently.   He normally uses Dulera 251mg and Spiriva 2.551m but he has been fighting his [facility] paperwork back to his insurance company yesterday. I will check on the status of the approval. He has been using just the Spiriva 2.70m72mlately. He also has been taking '20mg'$  of prednisone daily.   [facility], can you please advise since RB is out of the office today? Thanks!

## 2021-08-07 NOTE — Telephone Encounter (Signed)
See message from 08/11. Will close this encounter.

## 2021-08-07 NOTE — Telephone Encounter (Signed)
Called and spoke with patient. He verbalized understanding. Will go ahead and send in the zpak for him.   [place] at [phone] and spoke with [facility]. She stated that it will take up to 48 hours for the appeal process and that they did receive the paperwork yesterday.   Nothing further needed.

## 2021-08-07 NOTE — Telephone Encounter (Signed)
Better not take Zpak. I didn't see allergy to erythromycin- maybe I missed it. If he is just finishing levaquin, then wait on additional antibiotic. Ok to substitute Advair 250, # 1 disc,   Inhale 1 puff then rinse mouth, twice daily.  Stay well hydrated and call back tomorrow or next week as needed for Dr Lamonte Sakai.

## 2021-08-07 NOTE — Telephone Encounter (Signed)
Called and spoke with patient, patient had a question about the zpack (azithromycin) that was prescribed, he has an allergy to erythromycin (causes tongue swelling) and wants to know if it is safe to take the medication.  I advised him I would send this information back to Dr. Annamaria Boots and we would call him back after we hear back from him.  I advised him about the Symbicort inhaler as well.  He verbalized understanding. He says he is taking the last pill of the Levofloxacin 500 mg and wonders if he needs to take this mediation in stead.  He also wonders if [name] prescribed the 750 mg instead of the 500 mg the last time he took it.  He is also taking prednisone 20 mg in the am and 10 mg in the pm. While attempting to order the symbicort, I received a message that it was not on the formulary, however, Advair discus 100-50, 250-50 and 500-50 are covered.  Do you want to order this instead?  Dr. Annamaria Boots, Please advise.  Thank you.

## 2021-08-07 NOTE — Telephone Encounter (Signed)
Symbicort 160 should be equivalent to his [name] and is covered by most insurance. Please offer that and also suggest we also send Zpak 250 mg, # 6, 2 today then one daily.

## 2021-08-08 ENCOUNTER — Telehealth (HOSPITAL_COMMUNITY): Payer: Self-pay | Admitting: Adult Health

## 2021-08-12 ENCOUNTER — Other Ambulatory Visit (HOSPITAL_COMMUNITY): Payer: Self-pay | Admitting: Adult Health

## 2021-08-12 ENCOUNTER — Telehealth (HOSPITAL_COMMUNITY): Payer: Self-pay | Admitting: *Deleted

## 2021-08-12 NOTE — Telephone Encounter (Signed)
Pt called c/o extreme dizziness and headache about an hour after taking lasix. Pt said it feels as if he has vertigo after taking lasix. Pt has decreased lasix to '40mg'$  daily instead of '60mg'$  daily. Pts bp is normal 110's/60's.  Pt asked if he can decrease med again or take another medication. Pt said his fluid is fine no swelling or shortness of breath and his weight is stable.   Routed to [facility]

## 2021-08-12 NOTE — Telephone Encounter (Signed)
Pt left vm requesting echo results. No result note from [place] routed to [facility]

## 2021-08-13 ENCOUNTER — Other Ambulatory Visit: Payer: Self-pay | Admitting: Internal Medicine

## 2021-08-13 ENCOUNTER — Encounter: Payer: 59 | Admitting: Neurology

## 2021-08-13 NOTE — Progress Notes (Signed)
Remote pacemaker transmission.   

## 2021-08-13 NOTE — Telephone Encounter (Signed)
Pt aware and agreeable with plan.  

## 2021-08-14 ENCOUNTER — Ambulatory Visit (HOSPITAL_COMMUNITY)
Admission: RE | Admit: 2021-08-14 | Discharge: 2021-08-14 | Disposition: A | Discharge status: Home / Self Care | Payer: 59 | Source: Ambulatory Visit | Attending: Adult Health | Admitting: Adult Health

## 2021-08-14 ENCOUNTER — Other Ambulatory Visit: Payer: Self-pay

## 2021-08-14 ENCOUNTER — Other Ambulatory Visit (HOSPITAL_COMMUNITY): Payer: Self-pay | Admitting: *Deleted

## 2021-08-14 VITALS — BP 138/78 | HR 92 | Wt 228.4 lb

## 2021-08-14 DIAGNOSIS — Z881 Allergy status to other antibiotic agents status: Secondary | ICD-10-CM | POA: Diagnosis not present

## 2021-08-14 DIAGNOSIS — R42 Dizziness and giddiness: Secondary | ICD-10-CM | POA: Diagnosis not present

## 2021-08-14 DIAGNOSIS — I5022 Chronic systolic (congestive) heart failure: Secondary | ICD-10-CM | POA: Insufficient documentation

## 2021-08-14 DIAGNOSIS — Z7982 Long term (current) use of aspirin: Secondary | ICD-10-CM | POA: Insufficient documentation

## 2021-08-14 DIAGNOSIS — I10 Essential (primary) hypertension: Secondary | ICD-10-CM | POA: Diagnosis not present

## 2021-08-14 DIAGNOSIS — I428 Other cardiomyopathies: Secondary | ICD-10-CM | POA: Insufficient documentation

## 2021-08-14 DIAGNOSIS — R110 Nausea: Secondary | ICD-10-CM | POA: Insufficient documentation

## 2021-08-14 DIAGNOSIS — R682 Dry mouth, unspecified: Secondary | ICD-10-CM | POA: Insufficient documentation

## 2021-08-14 DIAGNOSIS — Z880 Allergy status to penicillin: Secondary | ICD-10-CM | POA: Insufficient documentation

## 2021-08-14 DIAGNOSIS — Z7952 Long term (current) use of systemic steroids: Secondary | ICD-10-CM | POA: Diagnosis not present

## 2021-08-14 DIAGNOSIS — Z6832 Body mass index (BMI) 32.0-32.9, adult: Secondary | ICD-10-CM | POA: Diagnosis not present

## 2021-08-14 DIAGNOSIS — G4733 Obstructive sleep apnea (adult) (pediatric): Secondary | ICD-10-CM | POA: Diagnosis not present

## 2021-08-14 DIAGNOSIS — R0789 Other chest pain: Secondary | ICD-10-CM | POA: Insufficient documentation

## 2021-08-14 DIAGNOSIS — Z950 Presence of cardiac pacemaker: Secondary | ICD-10-CM | POA: Diagnosis not present

## 2021-08-14 DIAGNOSIS — Z79899 Other long term (current) drug therapy: Secondary | ICD-10-CM | POA: Diagnosis not present

## 2021-08-14 DIAGNOSIS — I110 Hypertensive heart disease with heart failure: Secondary | ICD-10-CM | POA: Diagnosis present

## 2021-08-14 DIAGNOSIS — I5042 Chronic combined systolic (congestive) and diastolic (congestive) heart failure: Secondary | ICD-10-CM

## 2021-08-14 DIAGNOSIS — Z87891 Personal history of nicotine dependence: Secondary | ICD-10-CM | POA: Insufficient documentation

## 2021-08-14 DIAGNOSIS — R0600 Dyspnea, unspecified: Secondary | ICD-10-CM

## 2021-08-14 DIAGNOSIS — J449 Chronic obstructive pulmonary disease, unspecified: Secondary | ICD-10-CM | POA: Diagnosis not present

## 2021-08-14 DIAGNOSIS — R0609 Other forms of dyspnea: Secondary | ICD-10-CM

## 2021-08-14 LAB — CBC
HCT: 44.7 % (ref 39.0–52.0)
Hemoglobin: 15.1 g/dL (ref 13.0–17.0)
MCH: 33 pg (ref 26.0–34.0)
MCHC: 33.8 g/dL (ref 30.0–36.0)
MCV: 97.8 fL (ref 80.0–100.0)
Platelets: 148 10*3/uL — ABNORMAL LOW (ref 150–400)
RBC: 4.57 MIL/uL (ref 4.22–5.81)
RDW: 12.1 % (ref 11.5–15.5)
WBC: 9.2 10*3/uL (ref 4.0–10.5)
nRBC: 0 % (ref 0.0–0.2)

## 2021-08-14 LAB — BASIC METABOLIC PANEL
Anion gap: 8 (ref 5–15)
BUN: 21 mg/dL (ref 8–23)
CO2: 26 mmol/L (ref 22–32)
Calcium: 9.2 mg/dL (ref 8.9–10.3)
Chloride: 104 mmol/L (ref 98–111)
Creatinine, Ser: 1.01 mg/dL (ref 0.61–1.24)
GFR, Estimated: >60 mL/min (ref >60)
Glucose, Bld: 118 mg/dL — ABNORMAL HIGH (ref 70–99)
Potassium: 3.9 mmol/L (ref 3.5–5.1)
Sodium: 138 mmol/L (ref 135–145)

## 2021-08-14 MED ORDER — FUROSEMIDE 40 MG PO TABS
20.0000 mg | ORAL_TABLET | ORAL | Status: DC
Start: 2021-08-14 — End: 2021-10-20

## 2021-08-14 NOTE — Telephone Encounter (Signed)
Discussed at [place] 8/25

## 2021-08-14 NOTE — H&P (View-Only) (Signed)
ADVANCED HF CLINIC NOTE  MD: Dr. Haroldine Laws  Pulmonology: Dr Lamonte Sakai   Reason for Visit:  Heart Failure   HPI: Robert Church is a 65 y/o male with h/o obesity, HTN, COPD with ongoing tobacco use and chronic systolic.   He underwent cath in 4/18 due to CP. Cath showed normal coronaries with EF 40% and global HK. LVEDP 19.   He saw Dr. Gwenlyn Found on 07/01/2018 because of recurrent chest pain.  Myoview stress test performed 07/21/2018 showed EF 38% inferior scar with septal ischemia.  A 2D echo performed 07/07/2018 revealed an EF of 30 to 35%.  He was referred for cardiac CT.   Cardiac CT on 9/19.  Calcium score 148 (74th percentile)  LAD 25% otherwise normal cors. Dilated pulmonary arteries suggestive of PH.   Worked for [employer]. Retired 5/21. Follows with [name] in Pulmonary Clinic. Was started on bisoprolol 2.5 but couldn't tolerate due to fatigue. Was falling asleep at work. Stopped bisoprolol and felt better.   Echo 12/20 EF improved to 45%. Mild RV dysfunction   Admitted 1/22 for acute on chronic hypoxic respiratory failure secondary to acute COPD and CHF w/ volume overload. He had massive diuresis w/ IV Lasix, diuresed down from admit wt of 265 lb to 210 lb. Echo was repeated 12/21, EF 40-45% (unchanged). RV mildly reduced, RVSP 49.   Followed in clinic, Jardiance and Arlyce Harman added - dose adjusted due to intolerances. He then stopped these on his own.   In 4/22, had near syncope, Zio showed intermittent CHB. Repeat echo EF 30-35%% Had St Jude dual chamber PM 04/16/21. Had some PMT and device adjusted by EP .   Saw Dr Haroldine Laws 06/30/2021 and was started on 12.5 mg spironolactone and has been able to tolerate.   Had ECHO 07/31/21 with EF 30-35%,  moderately reduced RV, and diffuse HK worse in inferior base.  Today he returns for HF follow up due to ongoing dizziness. Has history of vertigo and has noticed dizziness when he takes lasix. Says he held lasix yesterday and felt better  with one brief episode of dizziness. SOB with inclines. Denies PND/Orthopnea. Denies syncope. Last night had chest tightness at rest and with exertion. Went away after a few minutes. Using Bipap nightly.  Appetite ok. No fever or chills. Weight at home has been stable. pounds. Taking all medications.  Device interrogated today in clinic:  AP 15%, VP > 99%   Cardiac studies: Echo 06/2018 LVEF 30-35%, RV normal  Echo 12/2018 LVEF 40-45%, RV normal  Echo 11/2020 LVEF 40-45%, RV mildly reduced Echo 03/2021 30-35%, RV mildly reduced  Echo 07/2021 EF 30-35% RV moderately reduced   Review of systems complete and found to be negative unless listed in HPI.    Past Medical History:  Diagnosis Date   Adenomatous colon polyp    Allergy    Anal fissure    Asthma    Bronchitis    CHF (congestive heart failure) (HCC)    COPD (chronic obstructive pulmonary disease) (HCC)    Emphysema lung (HCC)    GERD (gastroesophageal reflux disease)    Hyperlipidemia    Hypertension    OSA (obstructive sleep apnea)    Pneumonia    Sinusitis    SOB (shortness of breath)     Current Outpatient Medications  Medication Sig Dispense Refill   albuterol (PROVENTIL) (2.5 MG/3ML) 0.083% nebulizer solution Take 3 mLs (2.5 mg total) by nebulization every 6 (six) hours as needed for  wheezing or shortness of breath. 75 mL 12   albuterol (VENTOLIN HFA) 108 (90 Base) MCG/ACT inhaler INHALE 2 PUFFS INTO THE LUNGS EVERY 6 HOURS AS NEEDED FOR WHEEZING 18 g 2   allopurinol (ZYLOPRIM) 300 MG tablet TAKE 1 TABLET(300 MG) BY MOUTH DAILY (Patient taking differently: Take 300 mg by mouth daily as needed.) 90 tablet 1   ALPRAZolam (XANAX) 0.25 MG tablet Take 3 tablets (0.75 mg total) by mouth 2 (two) times daily as needed for anxiety. 60 tablet 3   aspirin EC 81 MG tablet Take 81 mg by mouth daily. Swallow whole.     azithromycin (ZITHROMAX) 250 MG tablet Take 2 tablets on first day, then 1 tablet daily until finished. 6 tablet 0    DALIRESP 500 MCG TABS tablet Take 500 mcg by mouth daily. 1/2 tab in the morning, 1/2 table in the afternoon     EPINEPHrine 0.3 mg/0.3 mL IJ SOAJ injection Inject into the muscle.     fluticasone (FLONASE) 50 MCG/ACT nasal spray Place 2 sprays into both nostrils daily. 16 g 2   fluticasone-salmeterol (ADVAIR) 250-50 MCG/ACT AEPB Inhale 1 puff into the lungs every 12 (twelve) hours. 60 each 0   furosemide (LASIX) 40 MG tablet Take 1.5 tablets (60 mg total) by mouth daily. 135 tablet 3   gabapentin (NEURONTIN) 100 MG capsule Take 1 capsule (100 mg total) by mouth 3 (three) times daily. (Patient taking differently: Take 100 mg by mouth 3 (three) times daily as needed.) 90 capsule 11   glucosamine-chondroitin 500-400 MG tablet Take 1 tablet by mouth daily in the afternoon.     levofloxacin (LEVAQUIN) 500 MG tablet Take 1 tablet (500 mg total) by mouth daily. 7 tablet 0   Lidocaine, Anorectal, 5 % CREA Apply topically as needed.     losartan (COZAAR) 25 MG tablet Take 0.5 tablets (12.5 mg total) by mouth at bedtime. 15 tablet 3   mometasone-formoterol (DULERA) 200-5 MCG/ACT AERO Inhale 2 puffs into the lungs 2 (two) times daily. 13 g 3   montelukast (SINGULAIR) 10 MG tablet TAKE 1 TABLET(10 MG) BY MOUTH AT BEDTIME 30 tablet 5   omeprazole (PRILOSEC) 20 MG capsule TAKE 1 CAPSULE(20 MG) BY MOUTH TWICE DAILY BEFORE A MEAL 180 capsule 1   Pitavastatin Calcium 2 MG TABS Take 1 tablet (2 mg total) by mouth daily. (Patient not taking: Reported on 07/23/2021) 90 tablet 1   polyethylene glycol (MIRALAX / GLYCOLAX) 17 g packet Take 17 g by mouth daily as needed. 14 each 0   predniSONE (DELTASONE) 10 MG tablet Take 1 tablet (10 mg total) by mouth daily with breakfast. (Patient taking differently: Take 10 mg by mouth daily.) 30 tablet 5   predniSONE (DELTASONE) 20 MG tablet Take 2 tablets (40 mg total) by mouth daily with breakfast. (Patient taking differently: Take 20 mg by mouth daily with breakfast.) 10 tablet 0    spironolactone (ALDACTONE) 25 MG tablet Take 0.5 tablets (12.5 mg total) by mouth at bedtime. 15 tablet 6   Tiotropium Bromide Monohydrate (SPIRIVA RESPIMAT) 2.5 MCG/ACT AERS Inhale 2.5 mcg into the lungs 2 (two) times daily. 4 g 11   vitamin B-12 (CYANOCOBALAMIN) 1000 MCG tablet Take 1,000 mcg by mouth daily.     Zinc 100 MG TABS Take 1 tablet by mouth daily.     No current facility-administered medications for this encounter.    Allergies  Allergen Reactions   Clindamycin Other (See Comments)    Tongue swelling  Doxycycline     Severe stomach upset per patient   E-Mycin [Erythromycin Base] Swelling   Jardiance [Empagliflozin] Nausea Only and Other (See Comments)    Lightheadness Dizziness   Penicillins Swelling and Other (See Comments)    Childhood rxn--MD stated he "almost died" Has patient had a PCN reaction causing immediate rash, facial/tongue/throat swelling, SOB or lightheadedness with hypotension:Yes Has patient had a PCN reaction causing severe rash involving mucus membranes or skin necrosis:unsure Has patient had a PCN reaction that required hospitalization:unsure Has patient had a PCN reaction occurring within the last 10 years:No If all of the above answers are "NO", then may proceed with Cephalosporin use.     Rosuvastatin     Other reaction(s): Myalgias (intolerance)      Social History   Socioeconomic History   Marital status: Single    Spouse name: Not on file   Number of children: 1   Years of education: 12   Highest education level: Not on file  Occupational History   Occupation: disabled  Tobacco Use   Smoking status: Former    Packs/day: 0.25    Years: 46.00    Pack years: 11.50    Types: Cigarettes    Quit date: 05/09/2021    Years since quitting: 0.2   Smokeless tobacco: Never  Vaping Use   Vaping Use: Never used  Substance and Sexual Activity   Alcohol use: Yes    Alcohol/week: 3.0 standard drinks    Types: 3 Cans of beer per week     Comment: 2 beers per day   Drug use: No   Sexual activity: Yes    Birth control/protection: None  Other Topics Concern   Not on file  Social History Narrative   Right handed   Tea sometimes and coffee (1/2 caffeine)   Social Determinants of Health   Financial Resource Strain: Medium Risk   Difficulty of Paying Living Expenses: Somewhat hard  Food Insecurity: No Food Insecurity   Worried About Charity fundraiser in the Last Year: Never true   Ran Out of Food in the Last Year: Never true  Transportation Needs: No Transportation Needs   Lack of Transportation (Medical): No   Lack of Transportation (Non-Medical): No  Physical Activity: Not on file  Stress: Not on file  Social Connections: Not on file  Intimate Partner Violence: Not on file      Family History  Problem Relation Age of Onset   Lung cancer Father    High blood pressure Mother    Diabetes Maternal Grandmother    Colon polyps Neg Hx    Esophageal cancer Neg Hx    Pancreatic cancer Neg Hx    Stomach cancer Neg Hx     Vitals:   08/14/21 1000 08/14/21 1005  BP: (!) 144/82 138/78  Pulse: 92   SpO2: 93%   Weight: 103.6 kg (228 lb 6 oz)    Reds Clip 34%.   PHYSICAL EXAM: General:  No resp difficulty HEENT: normal Neck: supple. no JVD. Carotids 2+ bilat; no bruits. No lymphadenopathy or thryomegaly appreciated. Cor: PMI nondisplaced. Regular rate & rhythm. No rubs, gallops or murmurs. Lungs: clear Abdomen: obese, soft, nontender, nondistended. No hepatosplenomegaly. No bruits or masses. Good bowel sounds. Extremities: no cyanosis, clubbing, rash, edema Neuro: alert & orientedx3, cranial nerves grossly intact. moves all 4 extremities w/o difficulty. Affect pleasant  EKG: A -Sense V paced 77 bpm QRS 210   ASSESSMENT & PLAN:  1. Chronic systolic  HF due to NICM  - cath 4/18 normal cors. EF 48%  - Myoview 07/21/2018 showed EF 38% inferior scar with septal ischemia.   - Echo 07/07/2018  EF 30-35%. RV normal.   - Cardiac CT 08/26/18.  Calcium score 148 (74th percentile)  LAD 25% otherwise normal cors. Dilated pulmonary arteries suggestive of PH.  - Suspect NICM due to HTN and inadequately treated OSA (He now reports improved nightly compliance w/ CPAP). - Echo 1/22 EF unchanged 40-45%. RV mildly reduced. RVSP 49 - Admit 4/22 for symptomatic bradycardia/CHF. Echo LVEF 30%. RV mildly reduced - Echo 07/31/21 EF 30-35 % and RV moderately reduced.  - Chronically NYHA Class III, COPD and obesity/ deconditioning  - Reds Clip 34%. For now will use lasix 20 mg every other day. He is not sure he wants to take daily diuretics due to dizziness.  - Intolerant to Jardiance positional dizziness and nausea.  - Continue Losartan 12.5 mg daily (failed Entresto due to dizziness/low BP) - Intolerant bisoprolol due to fatigue, coreg due to dizziness, and toprol due to dry mouth w/ Toprol.  - Continue 12.5 mg spironolactone at bed time.   - Given, CHB, biventricular dysfunction, moderate LVH and inability to tolerate HF meds due to orthostasis, there is concern about infiltrative CM (sarcoid, amyloid). cMRI considered but deferred at patient request.  (device MRI compatible per EP but shadowing may make scan less diagnostic)  -Multiple myeloma panel negative. He does not want to do PYP.  - Given ongoing issues with volume status and now with chest tightness will set up RHC/LHC - He has follow up with EP next month. Will need to consider device upgrade.   2. CHB: s/p PPM 4/22 - followed by Dr. Curt Bears   3. HTN Stable.   4. COPD  -  has quit smoking, congratulated on efforts  -  Follows with Pulmonary ([name]) -  home O2 PRN -  continue BiPAP   5. OSA - Continue nightly Bipap  6. Morbid obesity  Body mass index is 32.77 kg/m. Discussed portion control.   7. Pulmonary Nodules - followed by [name] - CT scan 3/22 stable, annual screening advised.   Check precath labs.  Discussed ECHO results.  Set up  RHC/LHC with Dr Haroldine Laws.  After cath will refer back to EP for possible device upgrade.   Follow up with Dr Haroldine Laws 4 weeks.   Robert Grinder, NP  10:58 AM

## 2021-08-14 NOTE — Patient Instructions (Addendum)
Decrease Furosemide to 20 mg (1/2 tab) every other day   Labs done today, your results will be available in MyChart, we will contact you for abnormal readings.  Heart Catheterization on Friday 08/22/21, see instructions below  Your physician recommends that you schedule a follow-up appointment in: 2 months  Keep appointment with Dr Curt Bears as scheduled on 09/02/21  Do the following things EVERYDAY: Weigh yourself in the morning before breakfast. Write it down and keep it in a log. Take your medicines as prescribed Eat low salt foods--Limit salt (sodium) to 2000 mg per day.  Stay as active as you can everyday Limit all fluids for the day to less than 2 liters  If you have any questions or concerns before your next appointment please send [place] a message through [place] or call our office at [phone].    TO LEAVE A MESSAGE FOR THE NURSE SELECT OPTION 2, PLEASE LEAVE A MESSAGE INCLUDING: YOUR NAME DATE OF BIRTH CALL BACK NUMBER REASON FOR CALL**this is important as we prioritize the call backs  YOU WILL RECEIVE A CALL BACK THE SAME DAY AS LONG AS YOU CALL BEFORE 4:00 PM  At the [facility], you and your health needs are our priority. As part of our continuing mission to provide you with exceptional heart care, we have created designated Provider Care Teams. These Care Teams include your primary Cardiologist (physician) and Advanced Practice Providers (APPs- Physician Assistants and Nurse Practitioners) who all work together to provide you with the care you need, when you need it.   You may see any of the following providers on your designated Care Team at your next follow up: Dr Glori Bickers Dr Loralie Champagne Dr Patrice Paradise, NP Lyda Jester, [place] Ginnie Smart Audry Riles, PharmD   Please be sure to bring in all your medications bottles to every appointment.    CATHETERIZATION INSTRUCTIONS:  You are scheduled for a Cardiac  Catheterization on Friday, September 2 with Dr. Glori Bickers.  1. Please arrive at the [facility] (Main Entrance A) at [facility]: [address] [place],  [id number] at 7:00 AM (This time is two hours before your procedure to ensure your preparation). Free valet parking service is available.   Special note: Every effort is made to have your procedure done on time. Please understand that emergencies sometimes delay scheduled procedures.  2. Diet: Do not eat solid foods after midnight.  The patient may have clear liquids until 5am upon the day of the procedure.  3. Labs: DONE TODAY  4. Medication instructions in preparation for your procedure:   Contrast Allergy: No   FRI 9/2 AM DO NOT TAKE: FUROSEMIDE, SPRIONOLACTONE  On the morning of your procedure, take your Aspirin and any morning medicines NOT listed above.  You may use sips of water.  5. Plan for one night stay--bring personal belongings. 6. Bring a current list of your medications and current insurance cards. 7. You MUST have a responsible person to drive you home. 8. Someone MUST be with you the first 24 hours after you arrive home or your discharge will be delayed. 9. Please wear clothes that are easy to get on and off and wear slip-on shoes.  Thank you for allowing [place] to care for you!   -- [facility]

## 2021-08-14 NOTE — Progress Notes (Signed)
 ADVANCED HF CLINIC NOTE  MD: Dr. Bensimhon  Pulmonology: Dr Byrum   Reason for Visit:  Heart Failure   HPI: [name] is a 65 y/o male with h/o obesity, HTN, COPD with ongoing tobacco use and chronic systolic.   He underwent cath in 4/18 due to CP. Cath showed normal coronaries with EF 40% and global HK. LVEDP 19.   He saw Dr. Berry on 07/01/2018 because of recurrent chest pain.  Myoview stress test performed 07/21/2018 showed EF 38% inferior scar with septal ischemia.  A 2D echo performed 07/07/2018 revealed an EF of 30 to 35%.  He was referred for cardiac CT.   Cardiac CT on 9/19.  Calcium score 148 (74th percentile)  LAD 25% otherwise normal cors. Dilated pulmonary arteries suggestive of PH.   Worked for [employer]. Retired 5/21. Follows with Dr. Byrum in Pulmonary Clinic. Was started on bisoprolol 2.5 but couldn't tolerate due to fatigue. Was falling asleep at work. Stopped bisoprolol and felt better.   Echo 12/20 EF improved to 45%. Mild RV dysfunction   Admitted 1/22 for acute on chronic hypoxic respiratory failure secondary to acute COPD and CHF w/ volume overload. He had massive diuresis w/ IV Lasix, diuresed down from admit wt of 265 lb to 210 lb. Echo was repeated 12/21, EF 40-45% (unchanged). RV mildly reduced, RVSP 49.   Followed in clinic, Jardiance and Spiro added - dose adjusted due to intolerances. He then stopped these on his own.   In 4/22, had near syncope, Zio showed intermittent CHB. Repeat echo EF 30-35%% Had St Jude dual chamber PM 04/16/21. Had some PMT and device adjusted by EP .   Saw Dr Bensimhon 06/30/2021 and was started on 12.5 mg spironolactone and has been able to tolerate.   Had ECHO 07/31/21 with EF 30-35%,  moderately reduced RV, and diffuse HK worse in inferior base.  Today he returns for HF follow up due to ongoing dizziness. Has history of vertigo and has noticed dizziness when he takes lasix. Says he held lasix yesterday and felt better  with one brief episode of dizziness. SOB with inclines. Denies PND/Orthopnea. Denies syncope. Last night had chest tightness at rest and with exertion. Went away after a few minutes. Using Bipap nightly.  Appetite ok. No fever or chills. Weight at home has been stable. pounds. Taking all medications.  Device interrogated today in clinic:  AP 15%, VP > 99%   Cardiac studies: Echo 06/2018 LVEF 30-35%, RV normal  Echo 12/2018 LVEF 40-45%, RV normal  Echo 11/2020 LVEF 40-45%, RV mildly reduced Echo 03/2021 30-35%, RV mildly reduced  Echo 07/2021 EF 30-35% RV moderately reduced   Review of systems complete and found to be negative unless listed in HPI.    Past Medical History:  Diagnosis Date   Adenomatous colon polyp    Allergy    Anal fissure    Asthma    Bronchitis    CHF (congestive heart failure) (HCC)    COPD (chronic obstructive pulmonary disease) (HCC)    Emphysema lung (HCC)    GERD (gastroesophageal reflux disease)    Hyperlipidemia    Hypertension    OSA (obstructive sleep apnea)    Pneumonia    Sinusitis    SOB (shortness of breath)     Current Outpatient Medications  Medication Sig Dispense Refill   albuterol (PROVENTIL) (2.5 MG/3ML) 0.083% nebulizer solution Take 3 mLs (2.5 mg total) by nebulization every 6 (six) hours as needed for   wheezing or shortness of breath. 75 mL 12   albuterol (VENTOLIN HFA) 108 (90 Base) MCG/ACT inhaler INHALE 2 PUFFS INTO THE LUNGS EVERY 6 HOURS AS NEEDED FOR WHEEZING 18 g 2   allopurinol (ZYLOPRIM) 300 MG tablet TAKE 1 TABLET(300 MG) BY MOUTH DAILY (Patient taking differently: Take 300 mg by mouth daily as needed.) 90 tablet 1   ALPRAZolam (XANAX) 0.25 MG tablet Take 3 tablets (0.75 mg total) by mouth 2 (two) times daily as needed for anxiety. 60 tablet 3   aspirin EC 81 MG tablet Take 81 mg by mouth daily. Swallow whole.     azithromycin (ZITHROMAX) 250 MG tablet Take 2 tablets on first day, then 1 tablet daily until finished. 6 tablet 0    DALIRESP 500 MCG TABS tablet Take 500 mcg by mouth daily. 1/2 tab in the morning, 1/2 table in the afternoon     EPINEPHrine 0.3 mg/0.3 mL IJ SOAJ injection Inject into the muscle.     fluticasone (FLONASE) 50 MCG/ACT nasal spray Place 2 sprays into both nostrils daily. 16 g 2   fluticasone-salmeterol (ADVAIR) 250-50 MCG/ACT AEPB Inhale 1 puff into the lungs every 12 (twelve) hours. 60 each 0   furosemide (LASIX) 40 MG tablet Take 1.5 tablets (60 mg total) by mouth daily. 135 tablet 3   gabapentin (NEURONTIN) 100 MG capsule Take 1 capsule (100 mg total) by mouth 3 (three) times daily. (Patient taking differently: Take 100 mg by mouth 3 (three) times daily as needed.) 90 capsule 11   glucosamine-chondroitin 500-400 MG tablet Take 1 tablet by mouth daily in the afternoon.     levofloxacin (LEVAQUIN) 500 MG tablet Take 1 tablet (500 mg total) by mouth daily. 7 tablet 0   Lidocaine, Anorectal, 5 % CREA Apply topically as needed.     losartan (COZAAR) 25 MG tablet Take 0.5 tablets (12.5 mg total) by mouth at bedtime. 15 tablet 3   mometasone-formoterol (DULERA) 200-5 MCG/ACT AERO Inhale 2 puffs into the lungs 2 (two) times daily. 13 g 3   montelukast (SINGULAIR) 10 MG tablet TAKE 1 TABLET(10 MG) BY MOUTH AT BEDTIME 30 tablet 5   omeprazole (PRILOSEC) 20 MG capsule TAKE 1 CAPSULE(20 MG) BY MOUTH TWICE DAILY BEFORE A MEAL 180 capsule 1   Pitavastatin Calcium 2 MG TABS Take 1 tablet (2 mg total) by mouth daily. (Patient not taking: Reported on 07/23/2021) 90 tablet 1   polyethylene glycol (MIRALAX / GLYCOLAX) 17 g packet Take 17 g by mouth daily as needed. 14 each 0   predniSONE (DELTASONE) 10 MG tablet Take 1 tablet (10 mg total) by mouth daily with breakfast. (Patient taking differently: Take 10 mg by mouth daily.) 30 tablet 5   predniSONE (DELTASONE) 20 MG tablet Take 2 tablets (40 mg total) by mouth daily with breakfast. (Patient taking differently: Take 20 mg by mouth daily with breakfast.) 10 tablet 0    spironolactone (ALDACTONE) 25 MG tablet Take 0.5 tablets (12.5 mg total) by mouth at bedtime. 15 tablet 6   Tiotropium Bromide Monohydrate (SPIRIVA RESPIMAT) 2.5 MCG/ACT AERS Inhale 2.5 mcg into the lungs 2 (two) times daily. 4 g 11   vitamin B-12 (CYANOCOBALAMIN) 1000 MCG tablet Take 1,000 mcg by mouth daily.     Zinc 100 MG TABS Take 1 tablet by mouth daily.     No current facility-administered medications for this encounter.    Allergies  Allergen Reactions   Clindamycin Other (See Comments)    Tongue swelling     Doxycycline     Severe stomach upset per patient   E-Mycin [Erythromycin Base] Swelling   Jardiance [Empagliflozin] Nausea Only and Other (See Comments)    Lightheadness Dizziness   Penicillins Swelling and Other (See Comments)    Childhood rxn--MD stated he "almost died" Has patient had a PCN reaction causing immediate rash, facial/tongue/throat swelling, SOB or lightheadedness with hypotension:Yes Has patient had a PCN reaction causing severe rash involving mucus membranes or skin necrosis:unsure Has patient had a PCN reaction that required hospitalization:unsure Has patient had a PCN reaction occurring within the last 10 years:No If all of the above answers are "NO", then may proceed with Cephalosporin use.     Rosuvastatin     Other reaction(s): Myalgias (intolerance)      Social History   Socioeconomic History   Marital status: Single    Spouse name: Not on file   Number of children: 1   Years of education: 12   Highest education level: Not on file  Occupational History   Occupation: disabled  Tobacco Use   Smoking status: Former    Packs/day: 0.25    Years: 46.00    Pack years: 11.50    Types: Cigarettes    Quit date: 05/09/2021    Years since quitting: 0.2   Smokeless tobacco: Never  Vaping Use   Vaping Use: Never used  Substance and Sexual Activity   Alcohol use: Yes    Alcohol/week: 3.0 standard drinks    Types: 3 Cans of beer per week     Comment: 2 beers per day   Drug use: No   Sexual activity: Yes    Birth control/protection: None  Other Topics Concern   Not on file  Social History Narrative   Right handed   Tea sometimes and coffee (1/2 caffeine)   Social Determinants of Health   Financial Resource Strain: Medium Risk   Difficulty of Paying Living Expenses: Somewhat hard  Food Insecurity: No Food Insecurity   Worried About Running Out of Food in the Last Year: Never true   Ran Out of Food in the Last Year: Never true  Transportation Needs: No Transportation Needs   Lack of Transportation (Medical): No   Lack of Transportation (Non-Medical): No  Physical Activity: Not on file  Stress: Not on file  Social Connections: Not on file  Intimate Partner Violence: Not on file      Family History  Problem Relation Age of Onset   Lung cancer Father    High blood pressure Mother    Diabetes Maternal Grandmother    Colon polyps Neg Hx    Esophageal cancer Neg Hx    Pancreatic cancer Neg Hx    Stomach cancer Neg Hx     Vitals:   08/14/21 1000 08/14/21 1005  BP: (!) 144/82 138/78  Pulse: 92   SpO2: 93%   Weight: 103.6 kg (228 lb 6 oz)    Reds Clip 34%.   PHYSICAL EXAM: General:  No resp difficulty HEENT: normal Neck: supple. no JVD. Carotids 2+ bilat; no bruits. No lymphadenopathy or thryomegaly appreciated. Cor: PMI nondisplaced. Regular rate & rhythm. No rubs, gallops or murmurs. Lungs: clear Abdomen: obese, soft, nontender, nondistended. No hepatosplenomegaly. No bruits or masses. Good bowel sounds. Extremities: no cyanosis, clubbing, rash, edema Neuro: alert & orientedx3, cranial nerves grossly intact. moves all 4 extremities w/o difficulty. Affect pleasant  EKG: A -Sense V paced 77 bpm QRS 210   ASSESSMENT & PLAN:  1. Chronic systolic   HF due to NICM  - cath 4/18 normal cors. EF 48%  - Myoview 07/21/2018 showed EF 38% inferior scar with septal ischemia.   - Echo 07/07/2018  EF 30-35%. RV normal.   - Cardiac CT 08/26/18.  Calcium score 148 (74th percentile)  LAD 25% otherwise normal cors. Dilated pulmonary arteries suggestive of PH.  - Suspect NICM due to HTN and inadequately treated OSA (He now reports improved nightly compliance w/ CPAP). - Echo 1/22 EF unchanged 40-45%. RV mildly reduced. RVSP 49 - Admit 4/22 for symptomatic bradycardia/CHF. Echo LVEF 30%. RV mildly reduced - Echo 07/31/21 EF 30-35 % and RV moderately reduced.  - Chronically NYHA Class III, COPD and obesity/ deconditioning  - Reds Clip 34%. For now will use lasix 20 mg every other day. He is not sure he wants to take daily diuretics due to dizziness.  - Intolerant to Jardiance positional dizziness and nausea.  - Continue Losartan 12.5 mg daily (failed Entresto due to dizziness/low BP) - Intolerant bisoprolol due to fatigue, coreg due to dizziness, and toprol due to dry mouth w/ Toprol.  - Continue 12.5 mg spironolactone at bed time.   - Given, CHB, biventricular dysfunction, moderate LVH and inability to tolerate HF meds due to orthostasis, there is concern about infiltrative CM (sarcoid, amyloid). cMRI considered but deferred at patient request.  (device MRI compatible per EP but shadowing may make scan less diagnostic)  -Multiple myeloma panel negative. He does not want to do PYP.  - Given ongoing issues with volume status and now with chest tightness will set up RHC/LHC - He has follow up with EP next month. Will need to consider device upgrade.   2. CHB: s/p PPM 4/22 - followed by Dr. Camnitz   3. HTN Stable.   4. COPD  -  has quit smoking, congratulated on efforts  -  Follows with Pulmonary (Dr. Byrum) -  home O2 PRN -  continue BiPAP   5. OSA - Continue nightly Bipap  6. Morbid obesity  Body mass index is 32.77 kg/m. Discussed portion control.   7. Pulmonary Nodules - followed by Dr. Byrum - CT scan 3/22 stable, annual screening advised.   Check precath labs.  Discussed ECHO results.  Set up  RHC/LHC with Dr Bensimhon.  After cath will refer back to EP for possible device upgrade.   Follow up with Dr Bensimhon 4 weeks.   Robert Dubey, NP  10:58 AM   

## 2021-08-15 ENCOUNTER — Telehealth (HOSPITAL_COMMUNITY): Payer: Self-pay | Admitting: *Deleted

## 2021-08-15 NOTE — Telephone Encounter (Signed)
Pt states he did take his Lasix 20 mg today and has been dizzy again, he states it only last about half the day instead of the usual whole day when he was taking 40 mg but still doesn't like the feeling. Advised pt ok to hold Lasix for now and only take as needed for fluid, we discussed weights/fluid/salt intake. Pt is agreeable with this plan and will only take Lasix if needed, he is scheduled for heart cath on Fri 9/2

## 2021-08-16 ENCOUNTER — Other Ambulatory Visit: Payer: Self-pay | Admitting: Emergency Medicine

## 2021-08-20 ENCOUNTER — Other Ambulatory Visit: Payer: Self-pay

## 2021-08-20 ENCOUNTER — Encounter: Payer: Self-pay | Admitting: Internal Medicine

## 2021-08-20 ENCOUNTER — Ambulatory Visit (INDEPENDENT_AMBULATORY_CARE_PROVIDER_SITE_OTHER): Payer: 59 | Admitting: Emergency Medicine

## 2021-08-20 ENCOUNTER — Telehealth: Payer: Self-pay | Admitting: Internal Medicine

## 2021-08-20 ENCOUNTER — Encounter: Payer: Self-pay | Admitting: Emergency Medicine

## 2021-08-20 ENCOUNTER — Ambulatory Visit (INDEPENDENT_AMBULATORY_CARE_PROVIDER_SITE_OTHER): Payer: 59 | Admitting: Internal Medicine

## 2021-08-20 VITALS — BP 116/76 | HR 79 | Temp 97.9°F | Ht 70.0 in | Wt 228.0 lb

## 2021-08-20 DIAGNOSIS — E785 Hyperlipidemia, unspecified: Secondary | ICD-10-CM

## 2021-08-20 DIAGNOSIS — G4733 Obstructive sleep apnea (adult) (pediatric): Secondary | ICD-10-CM | POA: Diagnosis not present

## 2021-08-20 DIAGNOSIS — R911 Solitary pulmonary nodule: Secondary | ICD-10-CM | POA: Diagnosis not present

## 2021-08-20 DIAGNOSIS — F17210 Nicotine dependence, cigarettes, uncomplicated: Secondary | ICD-10-CM

## 2021-08-20 DIAGNOSIS — E118 Type 2 diabetes mellitus with unspecified complications: Secondary | ICD-10-CM | POA: Diagnosis not present

## 2021-08-20 DIAGNOSIS — F411 Generalized anxiety disorder: Secondary | ICD-10-CM

## 2021-08-20 DIAGNOSIS — Z0001 Encounter for general adult medical examination with abnormal findings: Secondary | ICD-10-CM | POA: Insufficient documentation

## 2021-08-20 DIAGNOSIS — Z23 Encounter for immunization: Secondary | ICD-10-CM | POA: Diagnosis not present

## 2021-08-20 DIAGNOSIS — J449 Chronic obstructive pulmonary disease, unspecified: Secondary | ICD-10-CM | POA: Diagnosis not present

## 2021-08-20 DIAGNOSIS — I5042 Chronic combined systolic (congestive) and diastolic (congestive) heart failure: Secondary | ICD-10-CM

## 2021-08-20 MED ORDER — MOMETASONE FURO-FORMOTEROL FUM 200-5 MCG/ACT IN AERO: 2.0000 | INHALATION_SPRAY | Freq: Two times a day (BID) | RESPIRATORY_TRACT | 11 refills | Status: DC

## 2021-08-20 MED ORDER — PREDNISONE 10 MG PO TABS: 10.0000 mg | ORAL_TABLET | Freq: Every day | ORAL | 5 refills | Status: AC

## 2021-08-20 MED ORDER — MONTELUKAST SODIUM 10 MG PO TABS: 10.0000 mg | ORAL_TABLET | Freq: Every day | ORAL | 4 refills | Status: DC

## 2021-08-20 MED ORDER — ALBUTEROL SULFATE (2.5 MG/3ML) 0.083% IN NEBU: 2.5000 mg | INHALATION_SOLUTION | RESPIRATORY_TRACT | 11 refills | Status: DC | PRN

## 2021-08-20 NOTE — Patient Instructions (Signed)
Health Maintenance, Male Adopting a healthy lifestyle and getting preventive care are important in promoting health and wellness. Ask your health care provider about: The right schedule for you to have regular tests and exams. Things you can do on your own to prevent diseases and keep yourself healthy. What should I know about diet, weight, and exercise? Eat a healthy diet  Eat a diet that includes plenty of vegetables, fruits, low-fat dairy products, and lean protein. Do not eat a lot of foods that are high in solid fats, added sugars, or sodium. Maintain a healthy weight Body mass index (BMI) is a measurement that can be used to identify possible weight problems. It estimates body fat based on height and weight. Your health care provider can help determine your BMI and help you achieve or maintain a healthy weight. Get regular exercise Get regular exercise. This is one of the most important things you can do for your health. Most adults should: Exercise for at least 150 minutes each week. The exercise should increase your heart rate and make you sweat (moderate-intensity exercise). Do strengthening exercises at least twice a week. This is in addition to the moderate-intensity exercise. Spend less time sitting. Even light physical activity can be beneficial. Watch cholesterol and blood lipids Have your blood tested for lipids and cholesterol at 63 years of age, then have this test every 5 years. You may need to have your cholesterol levels checked more often if: Your lipid or cholesterol levels are high. You are older than 63 years of age. You are at high risk for heart disease. What should I know about cancer screening? Many types of cancers can be detected early and may often be prevented. Depending on your health history and family history, you may need to have cancer screening at various ages. This may include screening for: Colorectal cancer. Prostate cancer. Skin cancer. Lung  cancer. What should I know about heart disease, diabetes, and high blood pressure? Blood pressure and heart disease High blood pressure causes heart disease and increases the risk of stroke. This is more likely to develop in people who have high blood pressure readings, are of African descent, or are overweight. Talk with your health care provider about your target blood pressure readings. Have your blood pressure checked: Every 3-5 years if you are 18-39 years of age. Every year if you are 40 years old or older. If you are between the ages of 65 and 75 and are a current or former smoker, ask your health care provider if you should have a one-time screening for abdominal aortic aneurysm (AAA). Diabetes Have regular diabetes screenings. This checks your fasting blood sugar level. Have the screening done: Once every three years after age 45 if you are at a normal weight and have a low risk for diabetes. More often and at a younger age if you are overweight or have a high risk for diabetes. What should I know about preventing infection? Hepatitis B If you have a higher risk for hepatitis B, you should be screened for this virus. Talk with your health care provider to find out if you are at risk for hepatitis B infection. Hepatitis C Blood testing is recommended for: Everyone born from 1945 through 1965. Anyone with known risk factors for hepatitis C. Sexually transmitted infections (STIs) You should be screened each year for STIs, including gonorrhea and chlamydia, if: You are sexually active and are younger than 63 years of age. You are older than 63 years   of age and your health care provider tells you that you are at risk for this type of infection. Your sexual activity has changed since you were last screened, and you are at increased risk for chlamydia or gonorrhea. Ask your health care provider if you are at risk. Ask your health care provider about whether you are at high risk for HIV.  Your health care provider may recommend a prescription medicine to help prevent HIV infection. If you choose to take medicine to prevent HIV, you should first get tested for HIV. You should then be tested every 3 months for as long as you are taking the medicine. Follow these instructions at home: Lifestyle Do not use any products that contain nicotine or tobacco, such as cigarettes, e-cigarettes, and chewing tobacco. If you need help quitting, ask your health care provider. Do not use street drugs. Do not share needles. Ask your health care provider for help if you need support or information about quitting drugs. Alcohol use Do not drink alcohol if your health care provider tells you not to drink. If you drink alcohol: Limit how much you have to 0-2 drinks a day. Be aware of how much alcohol is in your drink. In the [place] one drink equals one 12 oz bottle of beer (355 mL), one 5 oz glass of wine (148 mL), or one 1 oz glass of hard liquor (44 mL). General instructions Schedule regular health, dental, and eye exams. Stay current with your vaccines. Tell your health care provider if: You often feel depressed. You have ever been abused or do not feel safe at home. Summary Adopting a healthy lifestyle and getting preventive care are important in promoting health and wellness. Follow your health care provider's instructions about healthy diet, exercising, and getting tested or screened for diseases. Follow your health care provider's instructions on monitoring your cholesterol and blood pressure. This information is not intended to replace advice given to you by your health care provider. Make sure you discuss any questions you have with your health care provider. Document Revised: 02/14/2021 Document Reviewed: 11/30/2018 Elsevier Patient Education  2022 [employer]  

## 2021-08-20 NOTE — Assessment & Plan Note (Signed)
With almost certain secondary pulmonary hypertension, large contribution from his lung disease and suspected undertreated hypoxemia.  He is planning for a left and right heart catheterization this week

## 2021-08-20 NOTE — Progress Notes (Signed)
Subjective:  Patient ID: Robert Church, male    DOB: 28-Sep-1956  Age: 65 y.o. MRN: [id number]  CC: Annual Exam  This visit occurred during the SARS-CoV-2 public health emergency.  Safety protocols were in place, including screening questions prior to the visit, additional usage of staff PPE, and extensive cleaning of exam room while observing appropriate contact time as indicated for disinfecting solutions.    HPI Denney Willy presents for a CPX and f/up -   He complains of chronic unchanged low back pain.  He has his usual complaint of shortness of breath but he denies edema, chest pain, diaphoresis, palpitations, dizziness, or lightheadedness.  He is not currently taking the loop diuretic.  He has started smoking again and wants a prescription for Nicorette patch.  Outpatient Medications Prior to Visit  Medication Sig Dispense Refill   albuterol (PROVENTIL) (2.5 MG/3ML) 0.083% nebulizer solution Take 3 mLs (2.5 mg total) by nebulization every 6 (six) hours as needed for wheezing or shortness of breath. 75 mL 12   allopurinol (ZYLOPRIM) 300 MG tablet TAKE 1 TABLET(300 MG) BY MOUTH DAILY (Patient taking differently: Take 300 mg by mouth daily as needed (gout).) 90 tablet 1   aspirin EC 81 MG tablet Take 81 mg by mouth daily. Swallow whole.     DALIRESP 500 MCG TABS tablet Take 250 mcg by mouth 2 (two) times daily.     EPINEPHrine 0.3 mg/0.3 mL IJ SOAJ injection Inject 0.3 mg into the muscle as needed for anaphylaxis.     fluticasone (FLONASE) 50 MCG/ACT nasal spray Place 2 sprays into both nostrils daily. (Patient taking differently: Place 1 spray into both nostrils daily as needed for allergies.) 16 g 2   fluticasone-salmeterol (ADVAIR) 250-50 MCG/ACT AEPB Inhale 1 puff into the lungs every 12 (twelve) hours. 60 each 0   furosemide (LASIX) 40 MG tablet Take 0.5 tablets (20 mg total) by mouth every other day. (Patient not taking: Reported on 08/20/2021) 30 tablet    gabapentin  (NEURONTIN) 100 MG capsule Take 1 capsule (100 mg total) by mouth 3 (three) times daily. 90 capsule 11   Lidocaine, Anorectal, 5 % CREA Apply 1 application topically daily.     losartan (COZAAR) 25 MG tablet Take 0.5 tablets (12.5 mg total) by mouth at bedtime. 15 tablet 3   nicotine (NICODERM CQ - DOSED IN MG/24 HOURS) 14 mg/24hr patch Place 14 mg onto the skin daily.     NITROGLYCERIN RE Place 1 application rectally daily. 0.125%     omeprazole (PRILOSEC) 20 MG capsule TAKE 1 CAPSULE(20 MG) BY MOUTH TWICE DAILY BEFORE A MEAL 180 capsule 1   Pitavastatin Calcium 2 MG TABS Take 1 tablet (2 mg total) by mouth daily. 90 tablet 1   polyethylene glycol (MIRALAX / GLYCOLAX) 17 g packet Take 17 g by mouth daily as needed. (Patient taking differently: Take 17 g by mouth daily.) 14 each 0   predniSONE (DELTASONE) 10 MG tablet Take 1 tablet (10 mg total) by mouth daily with breakfast. 30 tablet 5   predniSONE (DELTASONE) 20 MG tablet Take 2 tablets (40 mg total) by mouth daily with breakfast. (Patient taking differently: Take 20 mg by mouth daily with breakfast.) 10 tablet 0   spironolactone (ALDACTONE) 25 MG tablet Take 0.5 tablets (12.5 mg total) by mouth at bedtime. 15 tablet 6   Tiotropium Bromide Monohydrate (SPIRIVA RESPIMAT) 2.5 MCG/ACT AERS Inhale 2.5 mcg into the lungs 2 (two) times daily. 4 g 11  albuterol (VENTOLIN HFA) 108 (90 Base) MCG/ACT inhaler INHALE 2 PUFFS INTO THE LUNGS EVERY 6 HOURS AS NEEDED FOR WHEEZING 18 g 2   ALPRAZolam (XANAX) 0.25 MG tablet Take 3 tablets (0.75 mg total) by mouth 2 (two) times daily as needed for anxiety. (Patient taking differently: Take 0.25 mg by mouth 3 (three) times daily.) 60 tablet 3   ibuprofen (ADVIL) 200 MG tablet Take 200 mg by mouth every 6 (six) hours as needed for moderate pain.     mometasone-formoterol (DULERA) 200-5 MCG/ACT AERO Inhale 2 puffs into the lungs 2 (two) times daily. 13 g 3   montelukast (SINGULAIR) 10 MG tablet TAKE 1 TABLET(10 MG) BY  MOUTH AT BEDTIME 30 tablet 5   No facility-administered medications prior to visit.    ROS Review of Systems  Constitutional:  Negative for chills, diaphoresis and fatigue.  HENT: Negative.    Eyes: Negative.   Respiratory:  Positive for shortness of breath. Negative for cough, chest tightness and wheezing.   Cardiovascular:  Negative for chest pain, palpitations and leg swelling.  Gastrointestinal:  Negative for abdominal pain, constipation, diarrhea, nausea and vomiting.  Endocrine: Negative.   Genitourinary: Negative.  Negative for difficulty urinating.  Musculoskeletal:  Positive for back pain. Negative for arthralgias.  Skin: Negative.  Negative for color change and pallor.  Neurological:  Negative for dizziness, weakness, light-headedness and numbness.  Hematological:  Negative for adenopathy. Does not bruise/bleed easily.  Psychiatric/Behavioral:  Negative for decreased concentration, dysphoric mood, self-injury, sleep disturbance and suicidal ideas. The patient is nervous/anxious.    Objective:  BP 116/76 (BP Location: Left Arm, Patient Position: Sitting, Cuff Size: Large)   Pulse 79   Temp 97.9 F (36.6 C) (Oral)   Ht '5\' 10"'$  (1.778 m)   Wt 228 lb (103.4 kg)   SpO2 94%   BMI 32.71 kg/m   BP Readings from Last 3 Encounters:  08/22/21 134/73  08/20/21 118/84  08/20/21 116/76    Wt Readings from Last 3 Encounters:  08/22/21 223 lb (101.2 kg)  08/20/21 228 lb 6.4 oz (103.6 kg)  08/20/21 228 lb (103.4 kg)    Physical Exam Vitals reviewed.  Constitutional:      General: He is not in acute distress.    Appearance: He is not toxic-appearing or diaphoretic.  HENT:     Nose: Nose normal.     Mouth/Throat:     Mouth: Mucous membranes are moist.  Eyes:     General: No scleral icterus.    Conjunctiva/sclera: Conjunctivae normal.  Cardiovascular:     Rate and Rhythm: Normal rate and regular rhythm.     Heart sounds: No murmur heard.   Gallop present.  Pulmonary:      Effort: No tachypnea or accessory muscle usage.     Breath sounds: Examination of the right-lower field reveals rhonchi. Examination of the left-lower field reveals rhonchi. Rhonchi present. No decreased breath sounds, wheezing or rales.  Abdominal:     General: Abdomen is protuberant. Bowel sounds are normal. There is no distension.     Palpations: Abdomen is soft. There is no hepatomegaly, splenomegaly or mass.     Tenderness: There is no abdominal tenderness.  Musculoskeletal:        General: Normal range of motion.     Cervical back: Neck supple.     Right lower leg: No edema.     Left lower leg: No edema.  Lymphadenopathy:     Cervical: No cervical adenopathy.  Skin:  General: Skin is warm and dry.     Coloration: Skin is not pale.  Neurological:     General: No focal deficit present.     Mental Status: He is alert. Mental status is at baseline.  Psychiatric:        Mood and Affect: Mood normal.        Behavior: Behavior normal.    Lab Results  Component Value Date   WBC 9.2 08/14/2021   HGB 13.9 08/22/2021   HCT 41.0 08/22/2021   PLT 148 (L) 08/14/2021   GLUCOSE 118 (H) 08/14/2021   CHOL 209 (H) 03/25/2021   TRIG 216.0 (H) 03/25/2021   HDL 45.20 03/25/2021   LDLDIRECT 150.0 03/25/2021   LDLCALC 108 (H) 07/10/2020   ALT 23 07/23/2021   AST 35 07/23/2021   NA 140 08/22/2021   K 3.6 08/22/2021   CL 104 08/14/2021   CREATININE 1.01 08/14/2021   BUN 21 08/14/2021   CO2 26 08/14/2021   TSH 1.147 04/16/2021   PSA 1.7 07/10/2020   INR 0.9 04/15/2021   HGBA1C 6.3 03/25/2021    No results found.  Assessment & Plan:   Zakary was seen today for annual exam.  Diagnoses and all orders for this visit:  Type II diabetes mellitus with manifestations ([place])- I will monitor his A1c and will treat if indicated. -     Hemoglobin A1c; Future -     Microalbumin / creatinine urine ratio; Future  Cigarette smoker -     nicotine (NICODERM CQ) 21 mg/24hr patch; Place 1  patch (21 mg total) onto the skin daily.  Hyperlipidemia LDL goal <70- Will check a fasting lipid panel to see if he has achieved his LDL goal. -     Lipid panel; Future  Morbid obesity ([name])- I encouraged lifestyle modifications. -     Hemoglobin A1c; Future  Encounter for general adult medical examination with abnormal findings- Exam completed, labs reviewed, vaccines reviewed - He refused vaccines against influenza, and tetanus but agreed to a pneumonia vaccine, cancer screenings are up-to-date, patient education was given. -     PSA; Future  Need for vaccination -     Pneumococcal conjugate vaccine 20-valent (Prevnar 20)  GAD (generalized anxiety disorder) -     ALPRAZolam (XANAX) 0.25 MG tablet; Take 1 tablet (0.25 mg total) by mouth 3 (three) times daily.  I have discontinued Olene Craven. Wiler's ibuprofen. I have also changed his ALPRAZolam. Additionally, I am having him start on nicotine. Lastly, I am having him maintain his polyethylene glycol, Lidocaine (Anorectal), losartan, predniSONE, Pitavastatin Calcium, gabapentin, allopurinol, albuterol, Spiriva Respimat, EPINEPHrine, Daliresp, spironolactone, omeprazole, predniSONE, fluticasone, aspirin EC, fluticasone-salmeterol, furosemide, nicotine, and NITROGLYCERIN RE.  Meds ordered this encounter  Medications   nicotine (NICODERM CQ) 21 mg/24hr patch    Sig: Place 1 patch (21 mg total) onto the skin daily.    Dispense:  28 patch    Refill:  0   ALPRAZolam (XANAX) 0.25 MG tablet    Sig: Take 1 tablet (0.25 mg total) by mouth 3 (three) times daily.    Dispense:  90 tablet    Refill:  2      Follow-up: Return in about 3 months (around 11/19/2021).  Scarlette Calico, MD

## 2021-08-20 NOTE — Assessment & Plan Note (Signed)
Continue to use your BiPAP every night while sleeping.

## 2021-08-20 NOTE — Telephone Encounter (Signed)
Patient was seen today in office  Provider advised patient he would send in rx for nicotine (NICODERM CQ - DOSED IN MG/24 HOURS) 14 mg/24hr patch  ALPRAZolam (XANAX) 0.50 MG tablet  Please make sure they have been sent to pharmacy:  [facility], [place] [facility] [address]  Phone:  [phone] Fax:  [phone]

## 2021-08-20 NOTE — Assessment & Plan Note (Signed)
Restarted smoking.  Discussed cessation with him today.

## 2021-08-20 NOTE — Assessment & Plan Note (Signed)
Please continue your Spiriva 2 puffs once daily. Please continue Dulera 2 puffs twice a day.  Rinse and gargle after using. Continue prednisone 10 mg once daily. Continue Singulair, Daliresp, Mucinex as you have been taking them. Follow with Dr Lamonte Sakai in 4 months or sooner if you have any problems.

## 2021-08-20 NOTE — Patient Instructions (Addendum)
Please continue your Spiriva 2 puffs once daily. Please continue Dulera 2 puffs twice a day.  Rinse and gargle after using. Continue prednisone 10 mg once daily. Continue Singulair, Daliresp, Mucinex as you have been taking them. Continue to use your BiPAP every night while sleeping. Use your oxygen at 2 L/min with exertion.  Our goal is to keep your oxygen saturations greater than 90%. You need to work hard on stopping smoking.  This is most important intervention you can make to prevent deterioration of your lung function and overall functional capacity Agree with right and left heart catheterization as planned.  Follow-up with cardiology Follow with Dr Lamonte Sakai in 4 months or sooner if you have any problems.

## 2021-08-20 NOTE — Progress Notes (Signed)
Subjective:  Patient ID: Robert Church, male    DOB: [birth year]  Age: 65 y.o. MRN: [id number]  CC:  Chief Complaint  Patient presents with   Follow-up    Covid 12wks ago, and now having sob    HPI  ROV 02/11/21 --follow-up visit 65 year old man active smoker with severe COPD and a chronic bronchitic phenotype.  Is on maintenance prednisone 10 mg daily.  I tried changing his Dulera/Spiriva to [place] in December, continued Daliresp, Singulair, Mucinex.  He is now back on on Spiriva, not Dulera. Albuterol use >> 4-5 x a day. He is off mucinex.  He was hospitalized in late December for acute CHF exacerbation. He is doing better with sodium. Remains SOB w any exertion. Has cough w clear mucous. Sinus congestion. He was started on BiPAP at hospital, is wearing reliably every night. He has O2, uses it prn but not w all exertion.  Treated with Levaquin in late January for possible bronchitis Quit smoking in early January!! He is using some xanax and nicotine patches./   ROV 08/20/21 --65 year old man, active smoker with severe COPD and chronic bronchitis.  I have been managing him on prednisone 10 mg daily, Daliresp, Singulair, Mucinex.  Last visit in February he was on Spiriva and we restarted Dulera.  He has albuterol which he is using approximately.  He was treated with Levaquin in June, prednisone in July, Levaquin again and August. He did get benefit - but it looks like he is having rash from the levaquin He is on BiPAP / Trilogy nightly, compliance is good, although his sleep quality is poor. 13/7, average VT > 600cc He is using oxygen only prn, does follow his SpO2  He is scheduled for R and L cardiac cath this week. 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  He is back to smoking, about 0.5 pk/day.  He had COVID this Spring. He does not believe that he has recovered to previous baseline    ROS Review of Systems  Constitutional:  Negative for activity change,  fatigue, fever and unexpected weight change.  HENT:  Negative for congestion, postnasal drip, rhinorrhea and sinus pain.   Eyes:  Negative for redness.  Respiratory:  Positive for cough, shortness of breath and wheezing. Negative for apnea, choking, chest tightness and stridor.   Cardiovascular:  Negative for chest pain, palpitations and leg swelling.  Gastrointestinal:  Negative for abdominal distention, abdominal pain, diarrhea and nausea.  Endocrine: Negative for polyuria.  Genitourinary:  Negative for dysuria and hematuria.  Musculoskeletal:  Negative for back pain, gait problem and myalgias.  Skin:  Negative for rash and wound.  Neurological:  Negative for dizziness, seizures, weakness and headaches.  Psychiatric/Behavioral:  Negative for agitation and self-injury.    Objective:   Today's Vitals: BP 118/84 (BP Location: Right Arm, Patient Position: Sitting, Cuff Size: Normal)   Pulse 87   Temp 98 F (36.7 C) (Oral)   Ht '5\' 10"'$  (1.778 m)   Wt 228 lb 6.4 oz (103.6 kg)   SpO2 96%   BMI 32.77 kg/m   Gen: Pleasant, well-nourished, in no distress,  normal affect  ENT: No lesions,  mouth clear,  oropharynx clear, no postnasal drip  Neck: No JVD, no stridor  Lungs: No use of accessory muscles, distant, no crackles, B exp wheezes.   Cardiovascular: RRR, heart sounds normal, no murmur or gallops, no peripheral edema  Musculoskeletal: No deformities, no cyanosis or clubbing  Neuro: alert, awake, non  focal  Skin: Warm, no lesions or rash   Assessment & Plan:  COPD, group D, by GOLD 2017 classification ([place]) Please continue your Spiriva 2 puffs once daily. Please continue Dulera 2 puffs twice a day.  Rinse and gargle after using. Continue prednisone 10 mg once daily. Continue Singulair, Daliresp, Mucinex as you have been taking them. Follow with Dr Lamonte Sakai in 4 months or sooner if you have any problems.  Obstructive sleep apnea syndrome Continue to use your BiPAP every night  while sleeping.  Solitary pulmonary nodule Plan for follow-up CT chest in March 2023  Cigarette smoker Restarted smoking.  Discussed cessation with him today.  Chronic combined systolic and diastolic CHF (congestive heart failure) ([place]) With almost certain secondary pulmonary hypertension, large contribution from his lung disease and suspected undertreated hypoxemia.  He is planning for a left and right heart catheterization this week    Baltazar Apo, MD, PhD 08/20/2021, 5:28 PM [facility] [phone] or if no answer [phone]

## 2021-08-20 NOTE — Assessment & Plan Note (Signed)
Plan for follow-up CT chest in March 2023

## 2021-08-21 MED ORDER — NICOTINE 21 MG/24HR TD PT24: 21.0000 mg | MEDICATED_PATCH | Freq: Every day | TRANSDERMAL | 0 refills | Status: DC

## 2021-08-21 MED ORDER — ALPRAZOLAM 0.25 MG PO TABS: 0.2500 mg | ORAL_TABLET | Freq: Three times a day (TID) | ORAL | 2 refills | Status: DC

## 2021-08-22 ENCOUNTER — Encounter (HOSPITAL_COMMUNITY)
Admission: RE | Disposition: A | Discharge status: Home / Self Care | Payer: Self-pay | Source: Home / Self Care | Attending: Internal Medicine

## 2021-08-22 ENCOUNTER — Other Ambulatory Visit: Payer: Self-pay

## 2021-08-22 ENCOUNTER — Ambulatory Visit (HOSPITAL_COMMUNITY)
Admission: RE | Admit: 2021-08-22 | Discharge: 2021-08-22 | Disposition: A | Discharge status: Home / Self Care | Payer: 59 | Attending: Internal Medicine | Admitting: Internal Medicine

## 2021-08-22 ENCOUNTER — Telehealth: Payer: Self-pay | Admitting: Physician Assistant

## 2021-08-22 ENCOUNTER — Encounter (HOSPITAL_COMMUNITY): Payer: Self-pay | Admitting: Internal Medicine

## 2021-08-22 DIAGNOSIS — Z79899 Other long term (current) drug therapy: Secondary | ICD-10-CM | POA: Insufficient documentation

## 2021-08-22 DIAGNOSIS — J449 Chronic obstructive pulmonary disease, unspecified: Secondary | ICD-10-CM | POA: Insufficient documentation

## 2021-08-22 DIAGNOSIS — G4733 Obstructive sleep apnea (adult) (pediatric): Secondary | ICD-10-CM | POA: Insufficient documentation

## 2021-08-22 DIAGNOSIS — I5022 Chronic systolic (congestive) heart failure: Secondary | ICD-10-CM | POA: Insufficient documentation

## 2021-08-22 DIAGNOSIS — Z880 Allergy status to penicillin: Secondary | ICD-10-CM | POA: Insufficient documentation

## 2021-08-22 DIAGNOSIS — Z881 Allergy status to other antibiotic agents status: Secondary | ICD-10-CM | POA: Insufficient documentation

## 2021-08-22 DIAGNOSIS — Z950 Presence of cardiac pacemaker: Secondary | ICD-10-CM | POA: Insufficient documentation

## 2021-08-22 DIAGNOSIS — I428 Other cardiomyopathies: Secondary | ICD-10-CM | POA: Diagnosis not present

## 2021-08-22 DIAGNOSIS — Z888 Allergy status to other drugs, medicaments and biological substances status: Secondary | ICD-10-CM | POA: Insufficient documentation

## 2021-08-22 DIAGNOSIS — Z87891 Personal history of nicotine dependence: Secondary | ICD-10-CM | POA: Insufficient documentation

## 2021-08-22 DIAGNOSIS — Z7951 Long term (current) use of inhaled steroids: Secondary | ICD-10-CM | POA: Insufficient documentation

## 2021-08-22 DIAGNOSIS — I5042 Chronic combined systolic (congestive) and diastolic (congestive) heart failure: Secondary | ICD-10-CM

## 2021-08-22 DIAGNOSIS — Z6832 Body mass index (BMI) 32.0-32.9, adult: Secondary | ICD-10-CM | POA: Diagnosis not present

## 2021-08-22 DIAGNOSIS — Z7982 Long term (current) use of aspirin: Secondary | ICD-10-CM | POA: Insufficient documentation

## 2021-08-22 DIAGNOSIS — I442 Atrioventricular block, complete: Secondary | ICD-10-CM | POA: Diagnosis not present

## 2021-08-22 DIAGNOSIS — I110 Hypertensive heart disease with heart failure: Secondary | ICD-10-CM | POA: Diagnosis not present

## 2021-08-22 HISTORY — PX: RIGHT/LEFT HEART CATH AND CORONARY ANGIOGRAPHY: CATH118266

## 2021-08-22 LAB — POCT I-STAT EG7
Acid-Base Excess: 3 mmol/L — ABNORMAL HIGH (ref 0.0–2.0)
Acid-Base Excess: 3 mmol/L — ABNORMAL HIGH (ref 0.0–2.0)
Bicarbonate: 28.1 mmol/L — ABNORMAL HIGH (ref 20.0–28.0)
Bicarbonate: 28.6 mmol/L — ABNORMAL HIGH (ref 20.0–28.0)
Calcium, Ion: 1.16 mmol/L (ref 1.15–1.40)
Calcium, Ion: 1.17 mmol/L (ref 1.15–1.40)
HCT: 40 % (ref 39.0–52.0)
HCT: 41 % (ref 39.0–52.0)
Hemoglobin: 13.6 g/dL (ref 13.0–17.0)
Hemoglobin: 13.9 g/dL (ref 13.0–17.0)
O2 Saturation: 65 %
O2 Saturation: 66 %
Potassium: 3.5 mmol/L (ref 3.5–5.1)
Potassium: 3.6 mmol/L (ref 3.5–5.1)
Sodium: 140 mmol/L (ref 135–145)
Sodium: 141 mmol/L (ref 135–145)
TCO2: 29 mmol/L (ref 22–32)
TCO2: 30 mmol/L (ref 22–32)
pCO2, Ven: 44 mmHg (ref 44.0–60.0)
pCO2, Ven: 45.5 mmHg (ref 44.0–60.0)
pH, Ven: 7.407 (ref 7.250–7.430)
pH, Ven: 7.413 (ref 7.250–7.430)
pO2, Ven: 34 mmHg (ref 32.0–45.0)
pO2, Ven: 34 mmHg (ref 32.0–45.0)

## 2021-08-22 LAB — POCT I-STAT 7, (LYTES, BLD GAS, ICA,H+H)
Acid-Base Excess: 1 mmol/L (ref 0.0–2.0)
Bicarbonate: 25.4 mmol/L (ref 20.0–28.0)
Calcium, Ion: 1.03 mmol/L — ABNORMAL LOW (ref 1.15–1.40)
HCT: 38 % — ABNORMAL LOW (ref 39.0–52.0)
Hemoglobin: 12.9 g/dL — ABNORMAL LOW (ref 13.0–17.0)
O2 Saturation: 94 %
Potassium: 3.3 mmol/L — ABNORMAL LOW (ref 3.5–5.1)
Sodium: 142 mmol/L (ref 135–145)
TCO2: 27 mmol/L (ref 22–32)
pCO2 arterial: 37.4 mmHg (ref 32.0–48.0)
pH, Arterial: 7.44 (ref 7.350–7.450)
pO2, Arterial: 67 mmHg — ABNORMAL LOW (ref 83.0–108.0)

## 2021-08-22 SURGERY — RIGHT/LEFT HEART CATH AND CORONARY ANGIOGRAPHY
Anesthesia: LOCAL

## 2021-08-22 MED ORDER — HEPARIN SODIUM (PORCINE) 1000 UNIT/ML IJ SOLN
INTRAMUSCULAR | Status: AC
  Filled 2021-08-22: qty 1

## 2021-08-22 MED ORDER — HEPARIN SODIUM (PORCINE) 1000 UNIT/ML IJ SOLN
INTRAMUSCULAR | Status: DC | PRN
  Administered 2021-08-22: 5000 [IU] via INTRAVENOUS

## 2021-08-22 MED ORDER — HYDRALAZINE HCL 20 MG/ML IJ SOLN: 10.0000 mg | INTRAMUSCULAR | Status: DC | PRN

## 2021-08-22 MED ORDER — SODIUM CHLORIDE 0.9% FLUSH: 3.0000 mL | Freq: Two times a day (BID) | INTRAVENOUS | Status: DC

## 2021-08-22 MED ORDER — SODIUM CHLORIDE 0.9 % IV SOLN: INTRAVENOUS | Status: DC

## 2021-08-22 MED ORDER — ACETAMINOPHEN 325 MG PO TABS: 650.0000 mg | ORAL_TABLET | ORAL | Status: DC | PRN

## 2021-08-22 MED ORDER — LABETALOL HCL 5 MG/ML IV SOLN: 10.0000 mg | INTRAVENOUS | Status: DC | PRN

## 2021-08-22 MED ORDER — IOHEXOL 350 MG/ML SOLN
INTRAVENOUS | Status: DC | PRN
  Administered 2021-08-22: 35 mL

## 2021-08-22 MED ORDER — LIDOCAINE HCL (PF) 1 % IJ SOLN
INTRAMUSCULAR | Status: AC
  Filled 2021-08-22: qty 30

## 2021-08-22 MED ORDER — HEPARIN (PORCINE) IN NACL 1000-0.9 UT/500ML-% IV SOLN
INTRAVENOUS | Status: AC
  Filled 2021-08-22: qty 1000

## 2021-08-22 MED ORDER — SODIUM CHLORIDE 0.9% FLUSH: 3.0000 mL | INTRAVENOUS | Status: DC | PRN

## 2021-08-22 MED ORDER — LIDOCAINE HCL (PF) 1 % IJ SOLN
INTRAMUSCULAR | Status: DC | PRN
  Administered 2021-08-22 (×2): 2 mL

## 2021-08-22 MED ORDER — HEPARIN (PORCINE) IN NACL 1000-0.9 UT/500ML-% IV SOLN
INTRAVENOUS | Status: DC | PRN
  Administered 2021-08-22 (×2): 500 mL

## 2021-08-22 MED ORDER — SODIUM CHLORIDE 0.9 % IV SOLN: 250.0000 mL | INTRAVENOUS | Status: DC | PRN

## 2021-08-22 MED ORDER — ONDANSETRON HCL 4 MG/2ML IJ SOLN: 4.0000 mg | Freq: Four times a day (QID) | INTRAMUSCULAR | Status: DC | PRN

## 2021-08-22 MED ORDER — VERAPAMIL HCL 2.5 MG/ML IV SOLN
INTRAVENOUS | Status: AC
  Filled 2021-08-22: qty 2

## 2021-08-22 MED ORDER — VERAPAMIL HCL 2.5 MG/ML IV SOLN
INTRAVENOUS | Status: DC | PRN
  Administered 2021-08-22: 10 mL via INTRA_ARTERIAL

## 2021-08-22 SURGICAL SUPPLY — 10 items
CATH 5FR JL3.5 JR4 ANG PIG MP (CATHETERS) ×2 IMPLANT
CATH BALLN WEDGE 5F 110CM (CATHETERS) ×1 IMPLANT
DEVICE RAD COMP TR BAND LRG (VASCULAR PRODUCTS) ×2 IMPLANT
GLIDESHEATH SLEND SS 6F .021 (SHEATH) ×1 IMPLANT
GUIDEWIRE .025 260CM (WIRE) ×1 IMPLANT
GUIDEWIRE INQWIRE 1.5J.035X260 (WIRE) ×1 IMPLANT
INQWIRE 1.5J .035X260CM (WIRE) ×2
PACK CARDIAC CATHETERIZATION (CUSTOM PROCEDURE TRAY) ×2 IMPLANT
SHEATH GLIDE SLENDER 4/5FR (SHEATH) ×1 IMPLANT
TRANSDUCER W/STOPCOCK (MISCELLANEOUS) ×2 IMPLANT

## 2021-08-22 NOTE — Interval H&P Note (Signed)
History and Physical Interval Note:  08/22/2021 9:29 AM  Robert Church  has presented today for surgery, with the diagnosis of heart failure, chest pain and HF.  The various methods of treatment have been discussed with the patient and family. After consideration of risks, benefits and other options for treatment, the patient has consented to  Procedure(s): RIGHT/LEFT HEART CATH AND CORONARY ANGIOGRAPHY (N/A) and possible coronary angioplasty as a surgical intervention.  The patient's history has been reviewed, patient examined, no change in status, stable for surgery.  I have reviewed the patient's chart and labs.  Questions were answered to the patient's satisfaction.     Sherah Lund

## 2021-08-22 NOTE — Telephone Encounter (Signed)
Patient had a cardiac catheterization and wanted to know if he needed to sleep in a recliner in order to keep his arm up.  He is on BiPAP at night and this would be a complicated thing to do.  I recommended that he stay in his own bed and figure out, based on his situation, the best way to use pillows to keep his arm propped up while he is in bed.    Robert Church stated he felt like he would be able to do that.  Rosaria Ferries, PA-C 08/22/2021 7:53 PM

## 2021-08-26 ENCOUNTER — Telehealth: Payer: Self-pay | Admitting: *Deleted

## 2021-08-26 NOTE — Telephone Encounter (Signed)
   [facility] Pre-operative Risk Assessment    Patient Name: Robert Church  DOB: Aug 22, 1956 MRN: [id number]  HEARTCARE STAFF:  - IMPORTANT!!!!!! Under Visit Info/Reason for Call, type in Other and utilize the format Clearance MM/DD/YY or Clearance TBD. Do not use dashes or single digits. - Please review there is not already an duplicate clearance open for this procedure. - If request is for dental extraction, please clarify the # of teeth to be extracted. - If the patient is currently at the dentist's office, call Pre-Op Callback Staff (MA/nurse) to input urgent request.  - If the patient is not currently in the dentist office, please route to the Pre-Op pool.  Request for surgical clearance:  What type of surgery is being performed? ANORECTAL SURGERY  When is this surgery scheduled? TBD  What type of clearance is required (medical clearance vs. Pharmacy clearance to hold med vs. Both)? MEDICAL  Are there any medications that need to be held prior to surgery and how long? ASA  Practice name and name of physician performing surgery? [facility]; DR. Harrell Gave WHITE  What is the office phone number? [phone]   7.   What is the office fax number? [place]: [name], LPN  8.   Anesthesia type (None, local, MAC, general) ? GENERAL   Julaine Hua 08/26/2021, 1:52 PM  _________________________________________________________________   (provider comments below)

## 2021-08-26 NOTE — Telephone Encounter (Signed)
Dr. Haroldine Laws You recently performed right and left heart cath on this patient and recommended follow up with EP for consideration of device upgrade to CRT. We are asked for clearance for anorectal surgery. Do you recommend he see EP prior to surgery (not yet scheduled)?

## 2021-08-27 NOTE — Telephone Encounter (Signed)
   Name: Robert Church  DOB: Oct 11, 1956  MRN: [id number]   Primary Cardiologist: Quay Burow, MD  Chart reviewed as part of pre-operative protocol coverage. Patient was contacted 08/27/2021 in reference to pre-operative risk assessment for pending surgery as outlined below.  Robert Church was last seen on 08/22/21 by Dr. Haroldine Laws and had a right and left heart catheterization that showed normal coronary arteries. He is scheduled to see EP for consideration of CRT upgrade. Per Dr. Haroldine Laws: "He is OK to proceed with surgery from my standpoint." We would prefer to continue ASA throughout the perioperative period; however, may hold 5-7 days if needed.   Therefore, based on ACC/AHA guidelines, the patient would be at acceptable risk for the planned procedure without further cardiovascular testing.   The patient was advised that if he develops new symptoms prior to surgery to contact our office to arrange for a follow-up visit, and he verbalized understanding.  I will route this recommendation to the requesting party via Epic fax function and remove from pre-op pool. Please call with questions.  [place], PA 08/27/2021, 6:41 AM

## 2021-09-02 ENCOUNTER — Other Ambulatory Visit: Payer: Self-pay

## 2021-09-02 ENCOUNTER — Ambulatory Visit (INDEPENDENT_AMBULATORY_CARE_PROVIDER_SITE_OTHER): Payer: 59 | Admitting: Cardiology

## 2021-09-02 ENCOUNTER — Encounter: Payer: Self-pay | Admitting: Cardiology

## 2021-09-02 ENCOUNTER — Other Ambulatory Visit: Payer: Self-pay | Admitting: Internal Medicine

## 2021-09-02 VITALS — BP 150/82 | HR 91 | Ht 70.0 in | Wt 230.6 lb

## 2021-09-02 DIAGNOSIS — I428 Other cardiomyopathies: Secondary | ICD-10-CM

## 2021-09-02 NOTE — Progress Notes (Signed)
Electrophysiology Office Note   Date:  09/02/2021   ID:  Robert Church, DOB [birth year], MRN [id number]  PCP:  Janith Lima, MD  Cardiologist:  [place] Primary Electrophysiologist:  Devin Foskey Meredith Leeds, MD    Chief Complaint: pacemaker   History of Present Illness: Robert Church is a 65 y.o. male who is being seen today for the evaluation of pacemaker at the request of Janith Lima, MD. Presenting today for electrophysiology evaluation.  He has a history of nonischemic cardiomyopathy, COPD, obstructive sleep apnea on BiPAP, prior tobacco abuse, obesity, hypertension, hyperlipidemia, and heart failure.  He presented to the hospital April 2022 with complete heart block.  He is now status post [place] dual-chamber pacemaker implanted 04/16/2021.  At the time of initial evaluation his heart block was intermittent.  His heart block has become permanent and he has now pacing 100% of the time.  Unfortunately his ejection fraction is reduced.  He has had multiple issues with volume overload and heart failure symptoms.  He has been having dizziness when he takes his Lasix.  He has not had any further episodes of syncope.  He had a left right and left heart catheterization that showed elevated pressures and no coronary artery disease.  Today, he denies symptoms of palpitations, chest pain, orthopnea, PND, lower extremity edema, claudication, dizziness, presyncope, syncope, bleeding, or neurologic sequela. The patient is tolerating medications without difficulties.  His main complaints are fatigue and shortness of breath.  He states that he cannot walk across a large parking lot without getting very short of breath.  He feels well when he is at rest.  He has been taking Lasix, though they make him quite dizzy, he feels like he has vertigo.   Past Medical History:  Diagnosis Date   Adenomatous colon polyp    Allergy    Anal fissure    Asthma    Bronchitis    CHF (congestive  heart failure) (HCC)    COPD (chronic obstructive pulmonary disease) (HCC)    Emphysema lung (HCC)    GERD (gastroesophageal reflux disease)    Hyperlipidemia    Hypertension    OSA (obstructive sleep apnea)    Pneumonia    Sinusitis    SOB (shortness of breath)    Past Surgical History:  Procedure Laterality Date   LEFT HEART CATH AND CORONARY ANGIOGRAPHY N/A 04/19/2017   Procedure: Left Heart Cath and Coronary Angiography;  Surgeon: Belva Crome, MD;  Location: [facility];  Service: Cardiovascular;  Laterality: N/A;   PACEMAKER IMPLANT N/A 04/16/2021   Procedure: PACEMAKER IMPLANT;  Surgeon: Constance Haw, MD;  Location: [facility];  Service: Cardiovascular;  Laterality: N/A;   RIGHT/LEFT HEART CATH AND CORONARY ANGIOGRAPHY N/A 08/22/2021   Procedure: RIGHT/LEFT HEART CATH AND CORONARY ANGIOGRAPHY;  Surgeon: Jolaine Artist, MD;  Location: [facility];  Service: Cardiovascular;  Laterality: N/A;   TONSILLECTOMY       Current Outpatient Medications  Medication Sig Dispense Refill   albuterol (PROVENTIL) (2.5 MG/3ML) 0.083% nebulizer solution Take 3 mLs (2.5 mg total) by nebulization every 6 (six) hours as needed for wheezing or shortness of breath. 75 mL 12   albuterol (PROVENTIL) (2.5 MG/3ML) 0.083% nebulizer solution Take 3 mLs (2.5 mg total) by nebulization every 4 (four) hours as needed for wheezing or shortness of breath. 75 mL 11   albuterol (VENTOLIN HFA) 108 (90 Base) MCG/ACT inhaler INHALE 2 PUFFS INTO THE  LUNGS EVERY 6 HOURS AS NEEDED FOR WHEEZING 18 g 2   allopurinol (ZYLOPRIM) 300 MG tablet TAKE 1 TABLET(300 MG) BY MOUTH DAILY (Patient taking differently: Take 300 mg by mouth daily as needed (gout).) 90 tablet 1   ALPRAZolam (XANAX) 0.25 MG tablet Take 1 tablet (0.25 mg total) by mouth 3 (three) times daily. 90 tablet 2   aspirin EC 81 MG tablet Take 81 mg by mouth daily. Swallow whole.     DALIRESP 500 MCG TABS tablet Take 250 mcg by mouth 2  (two) times daily.     EPINEPHrine 0.3 mg/0.3 mL IJ SOAJ injection Inject 0.3 mg into the muscle as needed for anaphylaxis.     fluticasone (FLONASE) 50 MCG/ACT nasal spray Place 2 sprays into both nostrils daily. (Patient taking differently: Place 1 spray into both nostrils daily as needed for allergies.) 16 g 2   fluticasone-salmeterol (ADVAIR) 250-50 MCG/ACT AEPB Inhale 1 puff into the lungs every 12 (twelve) hours. 60 each 0   furosemide (LASIX) 40 MG tablet Take 0.5 tablets (20 mg total) by mouth every other day. 30 tablet    Lidocaine, Anorectal, 5 % CREA Apply 1 application topically daily.     losartan (COZAAR) 25 MG tablet Take 0.5 tablets (12.5 mg total) by mouth at bedtime. 15 tablet 3   mometasone-formoterol (DULERA) 200-5 MCG/ACT AERO Inhale 2 puffs into the lungs in the morning and at bedtime. 13 g 11   montelukast (SINGULAIR) 10 MG tablet Take 1 tablet (10 mg total) by mouth at bedtime. 30 tablet 4   nicotine (NICODERM CQ - DOSED IN MG/24 HOURS) 14 mg/24hr patch Place 14 mg onto the skin daily.     nicotine (NICODERM CQ) 21 mg/24hr patch Place 1 patch (21 mg total) onto the skin daily. 28 patch 0   NITROGLYCERIN RE Place 1 application rectally daily. 0.125%     omeprazole (PRILOSEC) 20 MG capsule TAKE 1 CAPSULE(20 MG) BY MOUTH TWICE DAILY BEFORE A MEAL 180 capsule 1   Pitavastatin Calcium 2 MG TABS Take 1 tablet (2 mg total) by mouth daily. 90 tablet 1   polyethylene glycol (MIRALAX / GLYCOLAX) 17 g packet Take 17 g by mouth daily as needed. (Patient taking differently: Take 17 g by mouth daily.) 14 each 0   predniSONE (DELTASONE) 10 MG tablet Take 1 tablet (10 mg total) by mouth daily with breakfast. 30 tablet 5   predniSONE (DELTASONE) 10 MG tablet Take 1 tablet (10 mg total) by mouth daily with breakfast. 30 tablet 5   predniSONE (DELTASONE) 20 MG tablet Take 2 tablets (40 mg total) by mouth daily with breakfast. (Patient taking differently: Take 20 mg by mouth daily with  breakfast.) 10 tablet 0   spironolactone (ALDACTONE) 25 MG tablet Take 0.5 tablets (12.5 mg total) by mouth at bedtime. 15 tablet 6   Tiotropium Bromide Monohydrate (SPIRIVA RESPIMAT) 2.5 MCG/ACT AERS Inhale 2.5 mcg into the lungs 2 (two) times daily. 4 g 11   gabapentin (NEURONTIN) 100 MG capsule Take 1 capsule (100 mg total) by mouth 3 (three) times daily. 90 capsule 11   No current facility-administered medications for this visit.    Allergies:   Clindamycin, Doxycycline, E-mycin [erythromycin base], Jardiance [empagliflozin], Penicillins, Rosuvastatin, and Levaquin [levofloxacin]   Social History:  The patient  reports that he has been smoking cigarettes. He has a 11.50 pack-year smoking history. He has never used smokeless tobacco. He reports current alcohol use of about 3.0 standard drinks per week.  He reports that he does not use drugs.   Family History:  The patient's family history includes Diabetes in his maternal grandmother; High blood pressure in his mother; Lung cancer in his father.    ROS:  Please see the history of present illness.   Otherwise, review of systems is positive for none.   All other systems are reviewed and negative.    PHYSICAL EXAM: VS:  BP (!) 150/82   Pulse 91   Ht '5\' 10"'$  (1.778 m)   Wt 230 lb 9.6 oz (104.6 kg)   SpO2 98%   BMI 33.09 kg/m  , BMI Body mass index is 33.09 kg/m. GEN: Well nourished, well developed, in no acute distress  HEENT: normal  Neck: no JVD, carotid bruits, or masses Cardiac: RRR; no murmurs, rubs, or gallops,no edema  Respiratory:  clear to auscultation bilaterally, normal work of breathing GI: soft, nontender, nondistended, + BS MS: no deformity or atrophy  Skin: warm and dry, device pocket is well healed Neuro:  Strength and sensation are intact Psych: euthymic mood, full affect  EKG:  EKG is not ordered today. Personal review of the ekg ordered 08/14/21 shows sinus rhythm, ventricular paced  Device interrogation is  reviewed today in detail.  See PaceArt for details.   Recent Labs: 12/25/2020: Magnesium 2.3 04/09/2021: Pro B Natriuretic peptide (BNP) 156.0 04/15/2021: B Natriuretic Peptide 345.2 04/16/2021: TSH 1.147 07/23/2021: ALT 23 08/14/2021: BUN 21; Creatinine, Ser 1.01; Platelets 148 08/22/2021: Hemoglobin 13.9; Potassium 3.6; Sodium 140    Lipid Panel     Component Value Date/Time   CHOL 209 (H) 03/25/2021 1519   TRIG 216.0 (H) 03/25/2021 1519   HDL 45.20 03/25/2021 1519   CHOLHDL 5 03/25/2021 1519   VLDL 43.2 (H) 03/25/2021 1519   LDLCALC 108 (H) 07/10/2020 1035   LDLDIRECT 150.0 03/25/2021 1519     Wt Readings from Last 3 Encounters:  09/02/21 230 lb 9.6 oz (104.6 kg)  08/22/21 223 lb (101.2 kg)  08/20/21 228 lb 6.4 oz (103.6 kg)      Other studies Reviewed: Additional studies/ records that were reviewed today include: RHC/LHC 08/22/21  Review of the above records today demonstrates:  Normal coronary arteries Mildly elevated filling pressures with normal output NICM EF 30-35%  TTE 07/31/21  1. Diffuse hypokinesis worse in inferior base with abnormal septal motion  EF similar to TTE done 04/16/21 . Left ventricular ejection fraction, by  estimation, is 30 to 35%. The left ventricle has moderately decreased  function. The left ventricle  demonstrates global hypokinesis. There is mild left ventricular  hypertrophy. Left ventricular diastolic parameters are indeterminate.   2. Right ventricular systolic function is moderately reduced. The right  ventricular size is mildly enlarged. There is normal pulmonary artery  systolic pressure.   3. The mitral valve is abnormal. Mild mitral valve regurgitation. No  evidence of mitral stenosis.   4. The aortic valve is tricuspid. Aortic valve regurgitation is not  visualized. Mild aortic valve sclerosis is present, with no evidence of  aortic valve stenosis.   5. Aortic dilatation noted. There is mild dilatation of the ascending  aorta,  measuring 40 mm.   6. The inferior vena cava is dilated in size with <50% respiratory  variability, suggesting right atrial pressure of 15 mmHg.    ASSESSMENT AND PLAN:  1.  1.  Chronic systolic heart failure due to nonischemic cardiomyopathy: Currently on optimal medical therapy with Aldactone 25 mg daily, losartan 25 mg daily.  Recent catheterization showed elevated filling pressures and was restarted on Lasix.  He has been unable to tolerate advances in heart failure medications.  He would benefit from upgrade to CRT-D.  Risk and benefits have been discussed which were bleeding, infection, tamponade, pneumothorax.  He understand these risks and is agreed to the procedure.  2.  Complete heart block: Status post [facility] dual-chamber pacemaker implanted April 2022.  Device functioning appropriately.  No changes.  3.  Hypertension: Elevated today.  Well-controlled on most recent checks.  No changes at this time.  4.  Obstructive sleep apnea: BiPAP compliance encouraged    Current medicines are reviewed at length with the patient today.   The patient does not have concerns regarding his medicines.  The following changes were made today:  none  Labs/ tests ordered today include:  No orders of the defined types were placed in this encounter.    Disposition:   FU with Carlea Badour 3 months  Signed, Vada Yellen Meredith Leeds, MD  09/02/2021 3:13 PM     [place] [address] [phone] (office) [phone] (fax)

## 2021-09-08 ENCOUNTER — Ambulatory Visit: Payer: Medicaid Other | Admitting: Internal Medicine

## 2021-09-12 ENCOUNTER — Telehealth: Payer: Self-pay | Admitting: Emergency Medicine

## 2021-09-12 MED ORDER — PREDNISONE 10 MG PO TABS: 20.0000 mg | ORAL_TABLET | Freq: Every day | ORAL | 0 refills | Status: AC

## 2021-09-12 NOTE — Telephone Encounter (Signed)
Spoke with pt and reviewed Dr. Agustina Caroli recommendations including increasing prednisone. Pt stated understanding. Prednisone called in. Nothing further needed at this time.

## 2021-09-12 NOTE — Telephone Encounter (Signed)
Primary Pulmonologist: [name] Last office visit and with whom: 08/20/21 with [name] What do we see them for (pulmonary problems): COPD, OSA, solitary pulm nodule, smoker and combined CHF Last OV assessment/plan: see below  Was appointment offered to patient (explain)?    COPD, group D, by GOLD 2017 classification ([place]) Please continue your Spiriva 2 puffs once daily. Please continue Dulera 2 puffs twice a day.  Rinse and gargle after using. Continue prednisone 10 mg once daily. Continue Singulair, Daliresp, Mucinex as you have been taking them. Follow with Dr Lamonte Sakai in 4 months or sooner if you have any problems.   Obstructive sleep apnea syndrome Continue to use your BiPAP every night while sleeping.   Solitary pulmonary nodule Plan for follow-up CT chest in March 2023   Cigarette smoker Restarted smoking.  Discussed cessation with him today.   Chronic combined systolic and diastolic CHF (congestive heart failure) ([place]) With almost certain secondary pulmonary hypertension, large contribution from his lung disease and suspected undertreated hypoxemia.  He is planning for a left and right heart catheterization this week     Reason for call: I called and spoke with patient and stated he has been having nasal and chest congestion, coughing with clear thick mucous, occ chest tighness. No other symptoms. Taking prednisone 10mg  in AM and 5mg  in PM, using Dulera and rescue inhaler, taking Daliesp. Patient stated he cannot take Levaquin anymore as it caused rash last time. He is still smoking 3-4 ciggs daily. Wants to know if he should go up to Prednisone 20mg . Will route to [name].  [name], please advise. Thanks!  (examples of things to ask: : When did symptoms start? Fever? Cough? Productive? Color to sputum? More sputum than usual? Wheezing? Have you needed increased oxygen? Are you taking your respiratory medications? What over the counter measures have you tried?)  Allergies   Allergen Reactions   Clindamycin Other (See Comments)    Tongue swelling   Doxycycline     Severe stomach upset per patient   E-Mycin [Erythromycin Base] Swelling   Jardiance [Empagliflozin] Nausea Only and Other (See Comments)    Lightheadness Dizziness   Penicillins Swelling and Other (See Comments)    Childhood rxn--MD stated he "almost died" Has patient had a PCN reaction causing immediate rash, facial/tongue/throat swelling, SOB or lightheadedness with hypotension:Yes Has patient had a PCN reaction causing severe rash involving mucus membranes or skin necrosis:unsure Has patient had a PCN reaction that required hospitalization:unsure Has patient had a PCN reaction occurring within the last 10 years:No If all of the above answers are "NO", then may proceed with Cephalosporin use.     Rosuvastatin     Myalgias (intolerance)   Levaquin [Levofloxacin] Rash    Immunization History  Administered Date(s) Administered   PNEUMOCOCCAL CONJUGATE-20 08/20/2021   Tdap 12/03/2011

## 2021-09-12 NOTE — Telephone Encounter (Signed)
Have him go to a total of 20mg  daily for a week to see if he gets benefit Needs to treat his nasal congestion with his standard meds.  Needs to stop smoking!

## 2021-09-15 ENCOUNTER — Telehealth: Payer: Self-pay | Admitting: Emergency Medicine

## 2021-09-15 MED ORDER — PREDNISONE 20 MG PO TABS: 20.0000 mg | ORAL_TABLET | Freq: Every day | ORAL | 0 refills | Status: AC

## 2021-09-15 NOTE — Telephone Encounter (Signed)
Call made to pharmacy, patient insurance would not cover the additional dose. They suggested he up his 10mg  to 20mg  for 7 days.   LMTCB to update and discuss with patient.

## 2021-09-15 NOTE — Telephone Encounter (Signed)
Patient returned call, made aware of Pharmacy recommendations. Patient states he just spoke with his insurance and they just need it sent in as 20mg  to approve it. Medication sent.   Nothing further needed at this time.

## 2021-09-21 ENCOUNTER — Other Ambulatory Visit: Payer: Self-pay | Admitting: Internal Medicine

## 2021-09-21 DIAGNOSIS — E785 Hyperlipidemia, unspecified: Secondary | ICD-10-CM

## 2021-09-22 ENCOUNTER — Telehealth: Payer: Self-pay | Admitting: Emergency Medicine

## 2021-09-22 ENCOUNTER — Telehealth: Payer: Self-pay | Admitting: Cardiovascular Disease

## 2021-09-22 DIAGNOSIS — I5042 Chronic combined systolic (congestive) and diastolic (congestive) heart failure: Secondary | ICD-10-CM

## 2021-09-22 NOTE — Telephone Encounter (Signed)
Pt called in regards to his upgrade to biv PPM.  Advised Pt had November 9 tentatively held for upgrade d/t Pt with alternate procedure scheduled for 10/09/2021.  Per Dr. Curt Bears Pt should wait 2 weeks between procedures.  Per Pt he had been dealing with anal fissure and fistula for 2 years.  States he has a knot in his anus.  States he would rather delay that surgery than his PPM upgrade.  Per Dr. Alleen Borne to proceed with upgrade if Pt delays anal fistulotomy.    Left detailed message requesting call back- October 18 is possibility for procedure.

## 2021-09-22 NOTE — Telephone Encounter (Signed)
New message:    Patient calling to see if still having his pacemaker surgery. Please advise patient he is confused.

## 2021-09-22 NOTE — Telephone Encounter (Signed)
Patient dropped off Application for Disability License Plate. Placed form in Dr. Agustina Caroli box for review.

## 2021-09-23 NOTE — Telephone Encounter (Signed)
Patient was returning call from the Nurse to schedule his procedure

## 2021-09-23 NOTE — Telephone Encounter (Signed)
Call returned to Pt.  Pt would like to schedule pacemaker upgrade on 10/07/2021 with Dr. Curt Bears  Will come for labs and soap and instruction on 10/7  Discussed with Dr. Alleen Borne to schedule biv ppm upgrade if Pt ok with delaying previously scheduled surgery with Dr. Dema Severin.  Reiterated to Pt he will have to delay his anal fistulotomy until advised ok to move forward after pacemaker upgrade.  Pt indicates understanding.  Work up complete

## 2021-09-25 NOTE — Telephone Encounter (Signed)
Spoke with pt and notified him application was signed. Pt states he would pick it up at office tomorrow. Nothing further needed at this time.

## 2021-09-26 ENCOUNTER — Other Ambulatory Visit: Payer: Self-pay

## 2021-09-26 ENCOUNTER — Other Ambulatory Visit: Payer: Medicaid Other | Admitting: *Deleted

## 2021-09-26 DIAGNOSIS — I5042 Chronic combined systolic (congestive) and diastolic (congestive) heart failure: Secondary | ICD-10-CM

## 2021-09-27 LAB — CBC WITH DIFFERENTIAL/PLATELET
Basophils Absolute: 0 10*3/uL (ref 0.0–0.2)
Basos: 0 %
EOS (ABSOLUTE): 0 10*3/uL (ref 0.0–0.4)
Eos: 0 %
Hematocrit: 44.4 % (ref 37.5–51.0)
Hemoglobin: 15.4 g/dL (ref 13.0–17.7)
Immature Grans (Abs): 0 10*3/uL (ref 0.0–0.1)
Immature Granulocytes: 0 %
Lymphocytes Absolute: 0.7 10*3/uL (ref 0.7–3.1)
Lymphs: 7 %
MCH: 33 pg (ref 26.6–33.0)
MCHC: 34.7 g/dL (ref 31.5–35.7)
MCV: 95 fL (ref 79–97)
Monocytes Absolute: 0.6 10*3/uL (ref 0.1–0.9)
Monocytes: 7 %
Neutrophils Absolute: 7.8 10*3/uL — ABNORMAL HIGH (ref 1.4–7.0)
Neutrophils: 86 %
Platelets: 215 10*3/uL (ref 150–450)
RBC: 4.66 x10E6/uL (ref 4.14–5.80)
RDW: 12.6 % (ref 11.6–15.4)
WBC: 9.1 10*3/uL (ref 3.4–10.8)

## 2021-09-27 LAB — BASIC METABOLIC PANEL
BUN/Creatinine Ratio: 17 (ref 10–24)
BUN: 18 mg/dL (ref 8–27)
CO2: 26 mmol/L (ref 20–29)
Calcium: 9.5 mg/dL (ref 8.6–10.2)
Chloride: 100 mmol/L (ref 96–106)
Creatinine, Ser: 1.05 mg/dL (ref 0.76–1.27)
Glucose: 116 mg/dL — ABNORMAL HIGH (ref 70–99)
Potassium: 4.3 mmol/L (ref 3.5–5.2)
Sodium: 142 mmol/L (ref 134–144)
eGFR: 80 mL/min/{1.73_m2} (ref >59)

## 2021-10-01 NOTE — Telephone Encounter (Signed)
Pt would prefer not to stay overnight..... Michela Pitcher he would make arrangements for someone to be w/ him for 24 hrs after procedure. Will send to Dr. Curt Bears for his [facility]

## 2021-10-01 NOTE — Telephone Encounter (Signed)
Pt aware to arrive at 8:30 am for his procedure 10/18 (time moved up). Patient verbalized understanding and agreeable to plan.    Pt is asking if someone needs to be with him for 24 hours after going home since it is only a gen change.  He is not sure he can make arrangements with anyone but his mother and that would be difficult. Aware I will forward to Dr. Shonna Chock for advisement if he is able to stay be himself after procedure Tuesday.

## 2021-10-06 ENCOUNTER — Telehealth: Payer: Self-pay | Admitting: Cardiology

## 2021-10-06 NOTE — Pre-Procedure Instructions (Signed)
Instructed patient on the following items: Arrival time 0830 Nothing to eat or drink after midnight No meds AM of procedure Responsible person to drive you home and stay with you for 24 hrs Wash with special soap night before and morning of procedure  

## 2021-10-06 NOTE — Telephone Encounter (Signed)
Patient said he came down with a sinus infection yesterday. He has not taken a COVID test yet when he called. He is not sure if he would still be able to have his procedure done tomorrow.  Please advise

## 2021-10-06 NOTE — Telephone Encounter (Signed)
Followed up with patient who reports he isn't feeling the greatest, stated he gets this every year. Experiencing sinus drainage, HA. Reports he tested negative for covid. Denies fever, chills.  Advised he should be ok to proceed with planned procedure tomorrow He agreed to call office later today if worsens. Aware to call office/hospital in the morning if worsens and cannot make the procedure. Patient verbalized understanding and agreeable to plan.

## 2021-10-07 ENCOUNTER — Ambulatory Visit (HOSPITAL_COMMUNITY)
Admission: RE | Admit: 2021-10-07 | Discharge: 2021-10-07 | Disposition: A | Discharge status: Home / Self Care | Payer: 59 | Source: Ambulatory Visit | Attending: Cardiology | Admitting: Cardiology

## 2021-10-07 ENCOUNTER — Ambulatory Visit (HOSPITAL_COMMUNITY): Payer: 59

## 2021-10-07 ENCOUNTER — Other Ambulatory Visit: Payer: Self-pay

## 2021-10-07 ENCOUNTER — Encounter (HOSPITAL_COMMUNITY)
Admission: RE | Disposition: A | Discharge status: Home / Self Care | Payer: Self-pay | Source: Ambulatory Visit | Attending: Cardiology

## 2021-10-07 DIAGNOSIS — G4733 Obstructive sleep apnea (adult) (pediatric): Secondary | ICD-10-CM | POA: Diagnosis not present

## 2021-10-07 DIAGNOSIS — Z45010 Encounter for checking and testing of cardiac pacemaker pulse generator [battery]: Secondary | ICD-10-CM | POA: Diagnosis present

## 2021-10-07 DIAGNOSIS — Z888 Allergy status to other drugs, medicaments and biological substances status: Secondary | ICD-10-CM | POA: Insufficient documentation

## 2021-10-07 DIAGNOSIS — Z881 Allergy status to other antibiotic agents status: Secondary | ICD-10-CM | POA: Insufficient documentation

## 2021-10-07 DIAGNOSIS — Z95818 Presence of other cardiac implants and grafts: Secondary | ICD-10-CM

## 2021-10-07 DIAGNOSIS — I428 Other cardiomyopathies: Secondary | ICD-10-CM | POA: Diagnosis not present

## 2021-10-07 DIAGNOSIS — I110 Hypertensive heart disease with heart failure: Secondary | ICD-10-CM | POA: Insufficient documentation

## 2021-10-07 DIAGNOSIS — J449 Chronic obstructive pulmonary disease, unspecified: Secondary | ICD-10-CM | POA: Insufficient documentation

## 2021-10-07 DIAGNOSIS — Z880 Allergy status to penicillin: Secondary | ICD-10-CM | POA: Diagnosis not present

## 2021-10-07 DIAGNOSIS — Z87891 Personal history of nicotine dependence: Secondary | ICD-10-CM | POA: Diagnosis not present

## 2021-10-07 DIAGNOSIS — E785 Hyperlipidemia, unspecified: Secondary | ICD-10-CM | POA: Insufficient documentation

## 2021-10-07 DIAGNOSIS — I5022 Chronic systolic (congestive) heart failure: Secondary | ICD-10-CM | POA: Insufficient documentation

## 2021-10-07 DIAGNOSIS — I447 Left bundle-branch block, unspecified: Secondary | ICD-10-CM | POA: Diagnosis not present

## 2021-10-07 HISTORY — PX: BIV UPGRADE: EP1202

## 2021-10-07 SURGERY — BIV UPGRADE

## 2021-10-07 MED ORDER — ONDANSETRON HCL 4 MG/2ML IJ SOLN
4.0000 mg | Freq: Four times a day (QID) | INTRAMUSCULAR | Status: DC | PRN
Start: 2021-10-07 — End: 2021-10-07

## 2021-10-07 MED ORDER — MIDAZOLAM HCL 5 MG/5ML IJ SOLN
INTRAMUSCULAR | Status: AC
  Filled 2021-10-07: qty 5

## 2021-10-07 MED ORDER — IOHEXOL 350 MG/ML SOLN
INTRAVENOUS | Status: DC | PRN
  Administered 2021-10-07: 20 mL via INTRAVENOUS
  Administered 2021-10-07: 10 mL via INTRAVENOUS

## 2021-10-07 MED ORDER — VANCOMYCIN HCL 1500 MG/300ML IV SOLN
1500.0000 mg | INTRAVENOUS | Status: AC
  Administered 2021-10-07: 1500 mg via INTRAVENOUS
  Filled 2021-10-07 (×2): qty 300

## 2021-10-07 MED ORDER — CHLORHEXIDINE GLUCONATE 4 % EX LIQD
4.0000 "application " | Freq: Once | CUTANEOUS | Status: DC
  Filled 2021-10-07: qty 60

## 2021-10-07 MED ORDER — SODIUM CHLORIDE 0.9 % IV SOLN
80.0000 mg | INTRAVENOUS | Status: AC
  Administered 2021-10-07: 80 mg

## 2021-10-07 MED ORDER — SODIUM CHLORIDE 0.9 % IV SOLN
INTRAVENOUS | Status: AC
  Filled 2021-10-07: qty 2

## 2021-10-07 MED ORDER — ACETAMINOPHEN 325 MG PO TABS: 325.0000 mg | ORAL_TABLET | ORAL | Status: DC | PRN

## 2021-10-07 MED ORDER — MIDAZOLAM HCL 5 MG/5ML IJ SOLN
INTRAMUSCULAR | Status: DC | PRN
  Administered 2021-10-07 (×3): 1 mg via INTRAVENOUS

## 2021-10-07 MED ORDER — LIDOCAINE HCL (PF) 1 % IJ SOLN
INTRAMUSCULAR | Status: AC
  Filled 2021-10-07: qty 60

## 2021-10-07 MED ORDER — LIDOCAINE HCL (PF) 1 % IJ SOLN
INTRAMUSCULAR | Status: DC | PRN
  Administered 2021-10-07: 60 mL

## 2021-10-07 MED ORDER — VANCOMYCIN HCL IN DEXTROSE 1-5 GM/200ML-% IV SOLN
INTRAVENOUS | Status: AC
  Filled 2021-10-07: qty 200

## 2021-10-07 MED ORDER — FENTANYL CITRATE (PF) 100 MCG/2ML IJ SOLN
INTRAMUSCULAR | Status: DC | PRN
  Administered 2021-10-07 (×3): 25 ug via INTRAVENOUS

## 2021-10-07 MED ORDER — HEPARIN (PORCINE) IN NACL 1000-0.9 UT/500ML-% IV SOLN
INTRAVENOUS | Status: AC
  Filled 2021-10-07: qty 500

## 2021-10-07 MED ORDER — FENTANYL CITRATE (PF) 100 MCG/2ML IJ SOLN
INTRAMUSCULAR | Status: AC
  Filled 2021-10-07: qty 2

## 2021-10-07 MED ORDER — POVIDONE-IODINE 10 % EX SWAB
2.0000 "application " | Freq: Once | CUTANEOUS | Status: AC
  Administered 2021-10-07: 2 via TOPICAL

## 2021-10-07 MED ORDER — HEPARIN (PORCINE) IN NACL 1000-0.9 UT/500ML-% IV SOLN
INTRAVENOUS | Status: DC | PRN
  Administered 2021-10-07: 500 mL

## 2021-10-07 MED ORDER — SODIUM CHLORIDE 0.9 % IV SOLN: INTRAVENOUS | Status: DC

## 2021-10-07 SURGICAL SUPPLY — 15 items
BALLN COR SINUS VENO 6FR 80 (BALLOONS) ×2
BALLOON COR SINUS VENO 6FR 80 (BALLOONS) ×1 IMPLANT
CABLE SURGICAL S-101-97-12 (CABLE) ×2 IMPLANT
CATH CPS DIRECT 135 DS2C020 (CATHETERS) ×2 IMPLANT
CATH CPS QUART CN DS2N029-65 (CATHETERS) ×1 IMPLANT
CATH RIGHTSITE C315HIS02 (CATHETERS) ×1 IMPLANT
KIT MICROPUNCTURE NIT STIFF (SHEATH) ×1 IMPLANT
MAT PREVALON FULL STRYKER (MISCELLANEOUS) ×1 IMPLANT
PAD PRO RADIOLUCENT 2001M-C (PAD) ×2 IMPLANT
SHEATH 9FR PRELUDE SNAP 13 (SHEATH) ×2 IMPLANT
SHEATH PROBE COVER 6X72 (BAG) ×2 IMPLANT
TRAY PACEMAKER INSERTION (PACKS) ×2 IMPLANT
WIRE ACUITY WHISPER EDS 4648 (WIRE) ×8 IMPLANT
WIRE GUIDERIGHT .032X150 (WIRE) ×1 IMPLANT
WIRE HI TORQ VERSACORE-J 145CM (WIRE) ×1 IMPLANT

## 2021-10-07 NOTE — Progress Notes (Signed)
Patient and mother was given discharge instructions. Both verbalized understanding. 

## 2021-10-07 NOTE — Progress Notes (Signed)
Pt has a Nicotine patch on his L shoulder blade.

## 2021-10-07 NOTE — H&P (Signed)
Electrophysiology Office Note   Date:  10/07/2021   ID:  Robert Church, DOB 1956/10/31, MRN [id number]  PCP:  Janith Lima, MD  Cardiologist:  [facility] Primary Electrophysiologist:  Nathanie Ottley Meredith Leeds, MD    Chief Complaint: pacemaker   History of Present Illness: Robert Church is a 65 y.o. male who is being seen today for the evaluation of pacemaker at the request of No ref. provider found. Presenting today for electrophysiology evaluation.  He has a history of nonischemic cardiomyopathy, COPD, obstructive sleep apnea on BiPAP, prior tobacco abuse, obesity, hypertension, hyperlipidemia, and heart failure.  He presented to the hospital April 2022 with complete heart block.  He is now status post [place] dual-chamber pacemaker implanted 04/16/2021.  At the time of initial evaluation his heart block was intermittent.  His heart block has become permanent and he has now pacing 100% of the time.  Unfortunately his ejection fraction is reduced.  He has had multiple issues with volume overload and heart failure symptoms.  He has been having dizziness when he takes his Lasix.  He has not had any further episodes of syncope.  He had a left right and left heart catheterization that showed elevated pressures and no coronary artery disease.  Today, he denies symptoms of palpitations, chest pain, orthopnea, PND, lower extremity edema, claudication, dizziness, presyncope, syncope, bleeding, or neurologic sequela. The patient is tolerating medications without difficulties.  His main complaints are fatigue and shortness of breath.  He states that he cannot walk across a large parking lot without getting very short of breath.  He feels well when he is at rest.  He has been taking Lasix, though they make him quite dizzy, he feels like he has vertigo.   Past Medical History:  Diagnosis Date   Adenomatous colon polyp    Allergy    Anal fissure    Asthma    Bronchitis    CHF (congestive heart  failure) (HCC)    COPD (chronic obstructive pulmonary disease) (HCC)    Emphysema lung (HCC)    GERD (gastroesophageal reflux disease)    Hyperlipidemia    Hypertension    OSA (obstructive sleep apnea)    Pneumonia    Sinusitis    SOB (shortness of breath)    Past Surgical History:  Procedure Laterality Date   LEFT HEART CATH AND CORONARY ANGIOGRAPHY N/A 04/19/2017   Procedure: Left Heart Cath and Coronary Angiography;  Surgeon: Belva Crome, MD;  Location: [facility];  Service: Cardiovascular;  Laterality: N/A;   PACEMAKER IMPLANT N/A 04/16/2021   Procedure: PACEMAKER IMPLANT;  Surgeon: Constance Haw, MD;  Location: [facility];  Service: Cardiovascular;  Laterality: N/A;   RIGHT/LEFT HEART CATH AND CORONARY ANGIOGRAPHY N/A 08/22/2021   Procedure: RIGHT/LEFT HEART CATH AND CORONARY ANGIOGRAPHY;  Surgeon: Jolaine Artist, MD;  Location: [facility];  Service: Cardiovascular;  Laterality: N/A;   TONSILLECTOMY       Current Facility-Administered Medications  Medication Dose Route Frequency Provider Last Rate Last Admin   0.9 %  sodium chloride infusion   Intravenous Continuous Constance Haw, MD 50 mL/hr at 10/07/21 0857 New Bag at 10/07/21 0857   chlorhexidine (HIBICLENS) 4 % liquid 4 application  4 application Topical Once Matteo Banke Hassell Done, MD       gentamicin (GARAMYCIN) 80 mg in sodium chloride 0.9 % 500 mL irrigation  80 mg Irrigation On Call Constance Haw, MD  vancomycin (VANCOREADY) IVPB 1500 mg/300 mL  1,500 mg Intravenous On Call Constance Haw, MD        Allergies:   Clindamycin, Doxycycline, E-mycin [erythromycin base], Jardiance [empagliflozin], Penicillins, Rosuvastatin, and Levaquin [levofloxacin]   Social History:  The patient  reports that he has been smoking cigarettes. He has a 11.50 pack-year smoking history. He has never used smokeless tobacco. He reports current alcohol use of about 3.0 standard drinks per  week. He reports that he does not use drugs.   Family History:  The patient's family history includes Diabetes in his maternal grandmother; High blood pressure in his mother; Lung cancer in his father.    ROS:  Please see the history of present illness.   Otherwise, review of systems is positive for none.   All other systems are reviewed and negative.    PHYSICAL EXAM: VS:  BP (!) 140/93   Pulse 86   Temp 97.6 F (36.4 C) (Oral)   Resp 18   Ht 5\' 10"  (1.778 m)   Wt 102.5 kg   SpO2 96%   BMI 32.43 kg/m  , BMI Body mass index is 32.43 kg/m. GEN: Well nourished, well developed, in no acute distress  HEENT: normal  Neck: no JVD, carotid bruits, or masses Cardiac: RRR; no murmurs, rubs, or gallops,no edema  Respiratory:  clear to auscultation bilaterally, normal work of breathing GI: soft, nontender, nondistended, + BS MS: no deformity or atrophy  Skin: warm and dry, device pocket is well healed Neuro:  Strength and sensation are intact Psych: euthymic mood, full affect  EKG:  EKG is not ordered today. Personal review of the ekg ordered 08/14/21 shows sinus rhythm, ventricular paced  Device interrogation is reviewed today in detail.  See PaceArt for details.   Recent Labs: 12/25/2020: Magnesium 2.3 04/09/2021: Pro B Natriuretic peptide (BNP) 156.0 04/15/2021: B Natriuretic Peptide 345.2 04/16/2021: TSH 1.147 07/23/2021: ALT 23 09/26/2021: BUN 18; Creatinine, Ser 1.05; Hemoglobin 15.4; Platelets 215; Potassium 4.3; Sodium 142    Lipid Panel     Component Value Date/Time   CHOL 209 (H) 03/25/2021 1519   TRIG 216.0 (H) 03/25/2021 1519   HDL 45.20 03/25/2021 1519   CHOLHDL 5 03/25/2021 1519   VLDL 43.2 (H) 03/25/2021 1519   LDLCALC 108 (H) 07/10/2020 1035   LDLDIRECT 150.0 03/25/2021 1519     Wt Readings from Last 3 Encounters:  10/07/21 102.5 kg  09/02/21 104.6 kg  08/22/21 101.2 kg      Other studies Reviewed: Additional studies/ records that were reviewed today  include: RHC/LHC 08/22/21  Review of the above records today demonstrates:  Normal coronary arteries Mildly elevated filling pressures with normal output NICM EF 30-35%  TTE 07/31/21  1. Diffuse hypokinesis worse in inferior base with abnormal septal motion  EF similar to TTE done 04/16/21 . Left ventricular ejection fraction, by  estimation, is 30 to 35%. The left ventricle has moderately decreased  function. The left ventricle  demonstrates global hypokinesis. There is mild left ventricular  hypertrophy. Left ventricular diastolic parameters are indeterminate.   2. Right ventricular systolic function is moderately reduced. The right  ventricular size is mildly enlarged. There is normal pulmonary artery  systolic pressure.   3. The mitral valve is abnormal. Mild mitral valve regurgitation. No  evidence of mitral stenosis.   4. The aortic valve is tricuspid. Aortic valve regurgitation is not  visualized. Mild aortic valve sclerosis is present, with no evidence of  aortic valve stenosis.  5. Aortic dilatation noted. There is mild dilatation of the ascending  aorta, measuring 40 mm.   6. The inferior vena cava is dilated in size with <50% respiratory  variability, suggesting right atrial pressure of 15 mmHg.    ASSESSMENT AND PLAN:  1.  1.  Chronic systolic heart failure due to nonischemic cardiomyopathy: CESAREO VICKREY has presented today for surgery, with the diagnosis of CHF.  The various methods of treatment have been discussed with the patient and family. After consideration of risks, benefits and other options for treatment, the patient has consented to  Procedure(s): CRT-P upgrade as a surgical intervention .  Risks include but not limited to bleeding, infection, pneumothorax, perforation, tamponade, vascular damage, renal failure, MI, stroke, death, and lead dislodgement . The patient's history has been reviewed, patient examined, no change in status, stable for surgery.  I have  reviewed the patient's chart and labs.  Questions were answered to the patient's satisfaction.    Sinan Tuch Curt Bears, MD 10/07/2021 10:11 AM

## 2021-10-07 NOTE — Discharge Instructions (Signed)

## 2021-10-07 NOTE — Progress Notes (Signed)
RT NOTES: Pt placed on dreamstation on auto bipap for procedure in cath lab.

## 2021-10-08 ENCOUNTER — Ambulatory Visit (INDEPENDENT_AMBULATORY_CARE_PROVIDER_SITE_OTHER): Payer: 59

## 2021-10-08 ENCOUNTER — Encounter (HOSPITAL_COMMUNITY): Payer: Self-pay | Admitting: Cardiology

## 2021-10-08 ENCOUNTER — Telehealth: Payer: Self-pay

## 2021-10-08 DIAGNOSIS — I428 Other cardiomyopathies: Secondary | ICD-10-CM

## 2021-10-08 NOTE — Telephone Encounter (Signed)
Patient concerned about taking the larger dressing off of device site as per DC instructions because of 2 small bloody spots, patient stated that he would feel better if someone at the office took dressing off, appointment made for patient to come in today at 12:00 to device clinic.

## 2021-10-08 NOTE — Telephone Encounter (Signed)
I spoke with the patient and He was worried about taking off the big dressing because it was some blood spots  on it. I put him on the schedule for today at 12 pm.

## 2021-10-08 NOTE — Progress Notes (Signed)
Patient presented to device clinic today s/p failed CRT-D upgrade 10/07/21. Patient was afraid to remove outer dressing today as directed. Outer dressing removed, steri-strips intact. No bleeding noted. Mild swelling. Patient instructed to utilize ice PRN swelling. Patient appreciative of appointment. Confirmed wound check 10/22/21 at 2:00 pm. Reinforced hospital discharge instructions.

## 2021-10-10 ENCOUNTER — Telehealth: Payer: Self-pay

## 2021-10-10 NOTE — Telephone Encounter (Signed)
Transmission received.

## 2021-10-10 NOTE — Telephone Encounter (Signed)
The patient wants to know if any adjustments was made on his pacemaker when he got it changed out. Every since he got the change out he has been swimmy headed.

## 2021-10-10 NOTE — Telephone Encounter (Signed)
Successful telephone encounter to patient to review manual transmission results and to further discuss patients c/o of "swimmy headedness". Patient states he was at an auto shop this morning, stood up and became dizzy. Dizziness resolved within seconds however he was wondering if it was related to his device. No abnormalities noted on transmission. Discussed effects of diuretics and BP medications. Also discuss adequate fluid intake. Patient does not check BP at home. Fall precautions discussed including changing positions slowly. He will continue to monitor his symptoms and contact HF clinic if symptoms continue. ED precautions also provided.

## 2021-10-19 NOTE — Progress Notes (Signed)
ADVANCED HF CLINIC NOTE  MD: Dr. Haroldine Laws  Pulmonology: Dr Lamonte Sakai   Reason for Visit:  Heart Failure   HPI: Robert Church is a 65 y/o male with h/o obesity, HTN, COPD with ongoing tobacco use and chronic systolic.   He underwent cath in 4/18 due to CP. Cath showed normal coronaries with EF 40% and global HK. LVEDP 19.   He saw Dr. Gwenlyn Found on 07/01/2018 because of recurrent chest pain.  Myoview stress test performed 07/21/2018 showed EF 38% inferior scar with septal ischemia.  A 2D echo performed 07/07/2018 revealed an EF of 30 to 35%.  He was referred for cardiac CT.   Cardiac CT on 9/19.  Calcium score 148 (74th percentile)  LAD 25% otherwise normal cors. Dilated pulmonary arteries suggestive of PH.   Worked for [employer]. Retired 5/21. Follows with [name] in Pulmonary Clinic. Was started on bisoprolol 2.5 but couldn't tolerate due to fatigue. Was falling asleep at work. Stopped bisoprolol and felt better.   Echo 12/20 EF improved to 45%. Mild RV dysfunction   Admitted 1/22 for acute on chronic hypoxic respiratory failure secondary to acute COPD and CHF w/ volume overload. He had massive diuresis w/ IV Lasix, diuresed down from admit wt of 265 lb to 210 lb. Echo was repeated 12/21, EF 40-45% (unchanged). RV mildly reduced, RVSP 49.   Followed in clinic, Jardiance and Arlyce Harman added - dose adjusted due to intolerances. He then stopped these on his own.   In 4/22, had near syncope, Zio showed intermittent CHB. Repeat echo EF 30-35%% Had [employer] dual chamber PM 04/16/21. Had some PMT and device adjusted by EP .   Had ECHO 07/31/21 with EF 30-35%,  moderately reduced RV, and diffuse HK worse in inferior base.  Underwent R/L cath in 9/22 for persistent SOB. No CAD. EF 35% Mildly elevated filling pressures with normal output.  Ao =  128/75 (95) LV =  130/24 RA =  10 RV =  40/12 PA = 45/19 (32) PCW = 18  Fick cardiac output/index = 5.1/2.3 PVR = 2.6 WU FA sat = 94% PA sat = 66%,  66%  Subsequently underwent CRT-D upgrade attempt on 10/07/21 by Dr. Shonna Chock due to high-degree of RV pacing. 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 Feels tired and SOB all the time. Says he hasn't felt good since he had COVID in 8/22. Says he has gained some weight. Takes lasix 76m Monday and Friday only.   Device interrogated today in clinic:  AP 15% -> 6%, VP 99% -> 38%   Cardiac studies: Echo 06/2018 LVEF 30-35%, RV normal  Echo 12/2018 LVEF 40-45%, RV normal  Echo 11/2020 LVEF 40-45%, RV mildly reduced Echo 03/2021 30-35%, RV mildly reduced  Echo 07/2021 EF 30-35% RV moderately reduced   Review of systems complete and found to be negative unless listed in HPI.    Past Medical History:  Diagnosis Date   Adenomatous colon polyp    Allergy    Anal fissure    Asthma    Bronchitis    CHF (congestive heart failure) (HCC)    COPD (chronic obstructive pulmonary disease) (HCC)    Emphysema lung (HCC)    GERD (gastroesophageal reflux disease)    Hyperlipidemia    Hypertension    OSA (obstructive sleep apnea)    Pneumonia    Sinusitis    SOB (shortness of breath)     Current Outpatient Medications  Medication Sig  Dispense Refill   ADVAIR DISKUS 250-50 MCG/ACT AEPB INHALE 1 PUFF INTO THE LUNGS EVERY 12 HOURS (Patient taking differently: Inhale 1 puff into the lungs See admin instructions. Inhale 1 puff every 12 hours when out of the [facility]) 60 each 11   albuterol (PROVENTIL) (2.5 MG/3ML) 0.083% nebulizer solution Take 3 mLs (2.5 mg total) by nebulization every 4 (four) hours as needed for wheezing or shortness of breath. 75 mL 11   albuterol (VENTOLIN HFA) 108 (90 Base) MCG/ACT inhaler INHALE 2 PUFFS INTO THE LUNGS EVERY 6 HOURS AS NEEDED FOR WHEEZING (Patient taking differently: Inhale 2 puffs into the lungs every 4 (four) hours as needed for shortness of breath or wheezing.) 18 g 2   allopurinol (ZYLOPRIM) 300 MG tablet TAKE 1 TABLET(300 MG) BY MOUTH DAILY  (Patient taking differently: Take 300 mg by mouth daily as needed (gout).) 90 tablet 1   ALPRAZolam (XANAX) 0.25 MG tablet Take 1 tablet (0.25 mg total) by mouth 3 (three) times daily. 90 tablet 2   aspirin EC 81 MG tablet Take 81 mg by mouth daily. Swallow whole.     Aspirin-Acetaminophen-Caffeine (GOODY HEADACHE PO) Take 0.5 packets by mouth daily as needed (back pain).     DALIRESP 500 MCG TABS tablet Take 250 mcg by mouth 2 (two) times daily.     EPINEPHrine 0.3 mg/0.3 mL IJ SOAJ injection Inject 0.3 mg into the muscle as needed for anaphylaxis.     fluticasone (FLONASE) 50 MCG/ACT nasal spray Place 2 sprays into both nostrils daily. (Patient taking differently: Place 1 spray into both nostrils daily as needed for allergies.) 16 g 2   furosemide (LASIX) 40 MG tablet Take 0.5 tablets (20 mg total) by mouth every other day. (Patient taking differently: Take 40 mg by mouth 2 (two) times a week. Fri and Mon) 30 tablet    gabapentin (NEURONTIN) 100 MG capsule Take 1 capsule (100 mg total) by mouth 3 (three) times daily. (Patient not taking: No sig reported) 90 capsule 11   Glucosamine HCl (GLUCOSAMINE PO) Take 1 tablet by mouth daily.     Lidocaine, Anorectal, 5 % CREA Apply 1 application topically 2 (two) times daily.     losartan (COZAAR) 25 MG tablet Take 0.5 tablets (12.5 mg total) by mouth at bedtime. 15 tablet 3   mometasone-formoterol (DULERA) 200-5 MCG/ACT AERO Inhale 2 puffs into the lungs in the morning and at bedtime. 13 g 11   montelukast (SINGULAIR) 10 MG tablet Take 1 tablet (10 mg total) by mouth at bedtime. 30 tablet 4   nicotine (NICODERM CQ - DOSED IN MG/24 HOURS) 14 mg/24hr patch Place 14 mg onto the skin daily.     nicotine (NICODERM CQ) 21 mg/24hr patch Place 1 patch (21 mg total) onto the skin daily. 28 patch 0   NITROGLYCERIN RE Place 1 application rectally 2 (two) times daily. 0.125%     omeprazole (PRILOSEC) 20 MG capsule TAKE 1 CAPSULE(20 MG) BY MOUTH TWICE DAILY BEFORE A  MEAL 180 capsule 1   Pitavastatin Calcium 2 MG TABS Take 1 tablet (2 mg total) by mouth daily. (Patient not taking: No sig reported) 90 tablet 1   polyethylene glycol (MIRALAX / GLYCOLAX) 17 g packet Take 17 g by mouth daily as needed. (Patient taking differently: Take 8.5 g by mouth daily.) 14 each 0   predniSONE (DELTASONE) 10 MG tablet Take 1 tablet (10 mg total) by mouth daily with breakfast. 30 tablet 5   predniSONE (DELTASONE) 20  MG tablet Take 2 tablets (40 mg total) by mouth daily with breakfast. (Patient taking differently: Take 10 mg by mouth daily in the afternoon.) 10 tablet 0   spironolactone (ALDACTONE) 25 MG tablet Take 0.5 tablets (12.5 mg total) by mouth at bedtime. 15 tablet 6   Tiotropium Bromide Monohydrate (SPIRIVA RESPIMAT) 2.5 MCG/ACT AERS Inhale 2.5 mcg into the lungs 2 (two) times daily. 4 g 11   No current facility-administered medications for this encounter.    Allergies  Allergen Reactions   Clindamycin Other (See Comments)    Tongue swelling   Doxycycline     Severe stomach upset per patient   E-Mycin [Erythromycin Base] Swelling   Jardiance [Empagliflozin] Nausea Only and Other (See Comments)    Lightheadness Dizziness   Penicillins Swelling and Other (See Comments)    Childhood rxn--MD stated he "almost died" Has patient had a PCN reaction causing immediate rash, facial/tongue/throat swelling, SOB or lightheadedness with hypotension:Yes Has patient had a PCN reaction causing severe rash involving mucus membranes or skin necrosis:unsure Has patient had a PCN reaction that required hospitalization:unsure Has patient had a PCN reaction occurring within the last 10 years:No If all of the above answers are "NO", then may proceed with Cephalosporin use.     Rosuvastatin     Myalgias (intolerance)   Levaquin [Levofloxacin] Rash      Social History   Socioeconomic History   Marital status: Single    Spouse name: Not on file   Number of children: 1    Years of education: 12   Highest education level: Not on file  Occupational History   Occupation: disabled  Tobacco Use   Smoking status: Every Day    Packs/day: 0.25    Years: 46.00    Pack years: 11.50    Types: Cigarettes   Smokeless tobacco: Never  Vaping Use   Vaping Use: Never used  Substance and Sexual Activity   Alcohol use: Yes    Alcohol/week: 3.0 standard drinks    Types: 3 Cans of beer per week    Comment: 2 beers per day   Drug use: No   Sexual activity: Yes    Partners: Female    Birth control/protection: None  Other Topics Concern   Not on file  Social History Narrative   Right handed   Tea sometimes and coffee (1/2 caffeine)   Social Determinants of Health   Financial Resource Strain: Medium Risk   Difficulty of Paying Living Expenses: Somewhat hard  Food Insecurity: No Food Insecurity   Worried About Charity fundraiser in the Last Year: Never true   Ran Out of Food in the Last Year: Never true  Transportation Needs: No Transportation Needs   Lack of Transportation (Medical): No   Lack of Transportation (Non-Medical): No  Physical Activity: Not on file  Stress: Not on file  Social Connections: Not on file  Intimate Partner Violence: Not on file      Family History  Problem Relation Age of Onset   Lung cancer Father    High blood pressure Mother    Diabetes Maternal Grandmother    Colon polyps Neg Hx    Esophageal cancer Neg Hx    Pancreatic cancer Neg Hx    Stomach cancer Neg Hx     Vitals:   10/20/21 1149  BP: (!) 142/90  Pulse: 78  SpO2: 95%  Weight: 107.2 kg (236 lb 6.4 oz)   Wt Readings from Last 3 Encounters:  10/20/21 107.2 kg (236 lb 6.4 oz)  10/07/21 102.5 kg (226 lb)  09/02/21 104.6 kg (230 lb 9.6 oz)     PHYSICAL EXAM: General:  Obese male No resp difficulty HEENT: normal Neck: supple.JVP 9-10 Carotids 2+ bilat; no bruits. No lymphadenopathy or thryomegaly appreciated. Cor: PMI nondisplaced. Regular rate & rhythm. No  rubs, gallops or murmurs. Lungs: minimal EE wheeze Abdomen: obese soft, nontender, + distended. No hepatosplenomegaly. No bruits or masses. Good bowel sounds. Extremities: no cyanosis, clubbing, rash, 2+ edema Neuro: alert & orientedx3, cranial nerves grossly intact. moves all 4 extremities w/o difficulty. Affect pleasant   ASSESSMENT & PLAN:  1. Chronic systolic HF due to NICM  - cath 4/18 normal cors. EF 48%  - Echo 7/19  EF 30-35%. RV normal.  - Cardiac CT 9/19.  Calcium score 148 (74th percentile)  LAD 25% otherwise normal cors. Dilated pulmonary arteries suggestive of PH.  - Suspect NICM due to HTN and inadequately treated OSA (He now reports improved nightly compliance w/ CPAP). - Echo 1/22 EF 40-45%. RV mildly reduced. RVSP 49 - Admit 4/22 for symptomatic bradycardia/CHF. Echo LVEF 30%. RV mildly reduced - Echo 07/31/21 EF 30-35 % and RV moderately reduced.  - Cath 9/22 No CAD EF 35%  - Failed upgrade to CRT-D due to 99% RV pacing. However RV pacing significantly decreased today - Chronically NYHA Class III, COPD and obesity/ deconditioning  - Volume status up today. He is up at least 10 pounds. Increase lasix to 40 daily for 1 week then drop back to 42m MWF (he prefers not to take every day)  - Intolerant to Jardiance positional dizziness and nausea.  - Continue Losartan 12.5 mg daily (failed Entresto due to dizziness/low BP) - Intolerant bisoprolol due to fatigue, coreg due to dizziness, and toprol due to dry mouth w/ Toprol.  - Continue 12.5 mg spironolactone qhs  - Given, CHB, biventricular dysfunction, moderate LVH and inability to tolerate HF meds due to orthostasis, there is concern about infiltrative CM (sarcoid, amyloid). cMRI considered but deferred at patient request.  (device MRI compatible per EP but shadowing may make scan less diagnostic)  - Multiple myeloma panel negative. He does not want to do PYP.  - Remain symptomatic out of proportion to EF, hemodynamics and  volume status. Suspect limitation is multfactorial.   2. CHB: s/p PPM 4/22 - followed by Dr. CCurt Bears - Failed CRT-D upgrade 10/22 due to subclavian stenosis  3. HTN - BP up today in setting of volume overload.   4. COPD  -  has quit smoking, congratulated on efforts  -  Follows with Pulmonary (Dr. BLamonte Sakai -  home O2 PRN -  continue BiPAP   5. OSA - Continue nightly Bipap  6. Morbid obesity  - I suspect this is a big part of his functional limitation/DOE  7. Pulmonary Nodules - followed by Dr. BLamonte Sakai- CT scan 3/22 stable, annual screening advised.    DGlori Bickers MD  11:21 PM

## 2021-10-20 ENCOUNTER — Ambulatory Visit (INDEPENDENT_AMBULATORY_CARE_PROVIDER_SITE_OTHER): Payer: 59 | Admitting: Internal Medicine

## 2021-10-20 ENCOUNTER — Ambulatory Visit (HOSPITAL_COMMUNITY)
Admission: RE | Admit: 2021-10-20 | Discharge: 2021-10-20 | Disposition: A | Discharge status: Home / Self Care | Payer: 59 | Source: Ambulatory Visit | Attending: Internal Medicine | Admitting: Internal Medicine

## 2021-10-20 ENCOUNTER — Encounter: Payer: Self-pay | Admitting: Internal Medicine

## 2021-10-20 ENCOUNTER — Other Ambulatory Visit: Payer: Self-pay

## 2021-10-20 ENCOUNTER — Encounter (HOSPITAL_COMMUNITY): Payer: Self-pay | Admitting: Internal Medicine

## 2021-10-20 VITALS — BP 136/86 | HR 80 | Temp 97.6°F | Ht 70.0 in | Wt 235.0 lb

## 2021-10-20 VITALS — BP 142/90 | HR 78 | Wt 236.4 lb

## 2021-10-20 DIAGNOSIS — R0602 Shortness of breath: Secondary | ICD-10-CM | POA: Insufficient documentation

## 2021-10-20 DIAGNOSIS — M1A0790 Idiopathic chronic gout, unspecified ankle and foot, without tophus (tophi): Secondary | ICD-10-CM | POA: Diagnosis not present

## 2021-10-20 DIAGNOSIS — J449 Chronic obstructive pulmonary disease, unspecified: Secondary | ICD-10-CM | POA: Diagnosis not present

## 2021-10-20 DIAGNOSIS — I110 Hypertensive heart disease with heart failure: Secondary | ICD-10-CM | POA: Diagnosis not present

## 2021-10-20 DIAGNOSIS — E785 Hyperlipidemia, unspecified: Secondary | ICD-10-CM

## 2021-10-20 DIAGNOSIS — E781 Pure hyperglyceridemia: Secondary | ICD-10-CM | POA: Diagnosis not present

## 2021-10-20 DIAGNOSIS — I5022 Chronic systolic (congestive) heart failure: Secondary | ICD-10-CM | POA: Diagnosis not present

## 2021-10-20 DIAGNOSIS — Z79899 Other long term (current) drug therapy: Secondary | ICD-10-CM | POA: Insufficient documentation

## 2021-10-20 DIAGNOSIS — Z8249 Family history of ischemic heart disease and other diseases of the circulatory system: Secondary | ICD-10-CM | POA: Diagnosis not present

## 2021-10-20 DIAGNOSIS — Z7952 Long term (current) use of systemic steroids: Secondary | ICD-10-CM | POA: Insufficient documentation

## 2021-10-20 DIAGNOSIS — Z23 Encounter for immunization: Secondary | ICD-10-CM

## 2021-10-20 DIAGNOSIS — Z7982 Long term (current) use of aspirin: Secondary | ICD-10-CM | POA: Insufficient documentation

## 2021-10-20 DIAGNOSIS — I428 Other cardiomyopathies: Secondary | ICD-10-CM | POA: Diagnosis not present

## 2021-10-20 DIAGNOSIS — E118 Type 2 diabetes mellitus with unspecified complications: Secondary | ICD-10-CM | POA: Diagnosis not present

## 2021-10-20 DIAGNOSIS — Z7901 Long term (current) use of anticoagulants: Secondary | ICD-10-CM | POA: Diagnosis not present

## 2021-10-20 DIAGNOSIS — Z87891 Personal history of nicotine dependence: Secondary | ICD-10-CM | POA: Diagnosis not present

## 2021-10-20 DIAGNOSIS — Z7951 Long term (current) use of inhaled steroids: Secondary | ICD-10-CM | POA: Insufficient documentation

## 2021-10-20 DIAGNOSIS — G4733 Obstructive sleep apnea (adult) (pediatric): Secondary | ICD-10-CM | POA: Insufficient documentation

## 2021-10-20 DIAGNOSIS — I10 Essential (primary) hypertension: Secondary | ICD-10-CM

## 2021-10-20 DIAGNOSIS — Z8616 Personal history of COVID-19: Secondary | ICD-10-CM | POA: Diagnosis not present

## 2021-10-20 LAB — LIPID PANEL
Cholesterol: 191 mg/dL (ref 0–200)
HDL: 52.7 mg/dL (ref >39.00)
LDL Cholesterol: 117 mg/dL — ABNORMAL HIGH (ref 0–99)
NonHDL: 138.78
Total CHOL/HDL Ratio: 4
Triglycerides: 110 mg/dL (ref 0.0–149.0)
VLDL: 22 mg/dL (ref 0.0–40.0)

## 2021-10-20 LAB — URIC ACID: Uric Acid, Serum: 9.2 mg/dL — ABNORMAL HIGH (ref 4.0–7.8)

## 2021-10-20 LAB — HEMOGLOBIN A1C: Hgb A1c MFr Bld: 5.6 % (ref 4.6–6.5)

## 2021-10-20 MED ORDER — FUROSEMIDE 40 MG PO TABS: 40.0000 mg | ORAL_TABLET | Freq: Every day | ORAL | 3 refills | Status: DC

## 2021-10-20 MED ORDER — BOOSTRIX 5-2.5-18.5 LF-MCG/0.5 IM SUSP: 0.5000 mL | Freq: Once | INTRAMUSCULAR | 0 refills | Status: AC

## 2021-10-20 MED ORDER — ALLOPURINOL 300 MG PO TABS: 300.0000 mg | ORAL_TABLET | ORAL | 0 refills | Status: DC | PRN

## 2021-10-20 MED ORDER — POTASSIUM CHLORIDE CRYS ER 20 MEQ PO TBCR: 20.0000 meq | EXTENDED_RELEASE_TABLET | Freq: Every day | ORAL | 3 refills | Status: DC

## 2021-10-20 NOTE — Patient Instructions (Signed)
Type 2 Diabetes Mellitus, Diagnosis, Adult ?Type 2 diabetes (type 2 diabetes mellitus) is a long-term, or chronic, disease. In type 2 diabetes, one or both of these problems may be present: ?The pancreas does not make enough of a hormone called insulin. ?Cells in the body do not respond properly to the insulin that the body makes (insulin resistance). ?Normally, insulin allows blood sugar (glucose) to enter cells in the body. The cells use glucose for energy. Insulin resistance or lack of insulin causes excess glucose to build up in the blood instead of going into cells. This causes high blood glucose (hyperglycemia).  ?What are the causes? ?The exact cause of type 2 diabetes is not known. ?What increases the risk? ?The following factors may make you more likely to develop this condition: ?Having a family member with type 2 diabetes. ?Being overweight or obese. ?Being inactive (sedentary). ?Having been diagnosed with insulin resistance. ?Having a history of prediabetes, diabetes when you were pregnant (gestational diabetes), or polycystic ovary syndrome (PCOS). ?What are the signs or symptoms? ?In the early stage of this condition, you may not have symptoms. Symptoms develop slowly and may include: ?Increased thirst or hunger. ?Increased urination. ?Unexplained weight loss. ?Tiredness (fatigue) or weakness. ?Vision changes, such as blurry vision. ?Dark patches on the skin. ?How is this diagnosed? ?This condition is diagnosed based on your symptoms, your medical history, a physical exam, and your blood glucose level. Your blood glucose may be checked with one or more of the following blood tests: ?A fasting blood glucose (FBG) test. You will not be allowed to eat (you will fast) for 8 hours or longer before a blood sample is taken. ?A random blood glucose test. This test checks blood glucose at any time of day regardless of when you ate. ?An A1C (hemoglobin A1C) blood test. This test provides information about blood  glucose levels over the previous 2-3 months. ?An oral glucose tolerance test (OGTT). This test measures your blood glucose at two times: ?After fasting. This is your baseline blood glucose level. ?Two hours after drinking a beverage that contains glucose. ?You may be diagnosed with type 2 diabetes if: ?Your fasting blood glucose level is 126 mg/dL (7.0 mmol/L) or higher. ?Your random blood glucose level is 200 mg/dL (11.1 mmol/L) or higher. ?Your A1C level is 6.5% or higher. ?Your oral glucose tolerance test result is higher than 200 mg/dL (11.1 mmol/L). ?These blood tests may be repeated to confirm your diagnosis. ?How is this treated? ?Your treatment may be managed by a specialist called an endocrinologist. Type 2 diabetes may be treated by following instructions from your health care provider about: ?Making dietary and lifestyle changes. These may include: ?Following a personalized nutrition plan that is developed by a registered dietitian. ?Exercising regularly. ?Finding ways to manage stress. ?Checking your blood glucose level as often as told. ?Taking diabetes medicines or insulin daily. This helps to keep your blood glucose levels in the healthy range. ?Taking medicines to help prevent complications from diabetes. Medicines may include: ?Aspirin. ?Medicine to lower cholesterol. ?Medicine to control blood pressure. ?Your health care provider will set treatment goals for you. Your goals will be based on your age, other medical conditions you have, and how you respond to diabetes treatment. Generally, the goal of treatment is to maintain the following blood glucose levels: ?Before meals: 80-130 mg/dL (4.4-7.2 mmol/L). ?After meals: below 180 mg/dL (10 mmol/L). ?A1C level: less than 7%. ?Follow these instructions at home: ?Questions to ask your health care provider ?  Consider asking the following questions: ?Should I meet with a certified diabetes care and education specialist? ?What diabetes medicines do I need,  and when should I take them? ?What equipment will I need to manage my diabetes at home? ?How often do I need to check my blood glucose? ?Where can I find a support group for people with diabetes? ?What number can I call if I have questions? ?When is my next appointment? ?General instructions ?Take over-the-counter and prescription medicines only as told by your health care provider. ?Keep all follow-up visits. This is important. ?Where to find more information ?For help and guidance and for more information about diabetes, please visit: ?American Diabetes Association (ADA): www.diabetes.org ?American Association of Diabetes Care and Education Specialists (ADCES): www.diabeteseducator.org ?International Diabetes Federation (IDF): www.idf.org ?Contact a health care provider if: ?Your blood glucose is at or above 240 mg/dL (13.3 mmol/L) for 2 days in a row. ?You have been sick or have had a fever for 2 days or longer, and you are not getting better. ?You have any of the following problems for more than 6 hours: ?You cannot eat or drink. ?You have nausea and vomiting. ?You have diarrhea. ?Get help right away if: ?You have severe hypoglycemia. This means your blood glucose is lower than 54 mg/dL (3.0 mmol/L). ?You become confused or you have trouble thinking clearly. ?You have difficulty breathing. ?You have moderate or large ketone levels in your urine. ?These symptoms may represent a serious problem that is an emergency. Do not wait to see if the symptoms will go away. Get medical help right away. Call your local emergency services (911 in the [place] Do not drive yourself to the hospital. ?Summary ?Type 2 diabetes mellitus is a long-term, or chronic, disease. In type 2 diabetes, the pancreas does not make enough of a hormone called insulin, or cells in the body do not respond properly to insulin that the body makes. ?This condition is treated by making dietary and lifestyle changes and taking diabetes medicines or  insulin. ?Your health care provider will set treatment goals for you. Your goals will be based on your age, other medical conditions you have, and how you respond to diabetes treatment. ?Keep all follow-up visits. This is important. ?This information is not intended to replace advice given to you by your health care provider. Make sure you discuss any questions you have with your health care provider. ?Document Revised: 03/03/2021 Document Reviewed: 03/03/2021 ?Elsevier Patient Education ? 2022 Elsevier Inc. ? ?

## 2021-10-20 NOTE — Patient Instructions (Addendum)
No Labs done today.   START Lasix 40mg  by mouth daily for 1 week then DECREASE to 1 tablet by mouth every Monday, Wednesday and Friday.   START Potassium 48meq(1 tablet) by mouth daily.   No other medication changes were made. Please continue all current medications as prescribed.  Your physician recommends that you schedule a follow-up appointment in: 2 months with our NP/[facility] here in our office.   If you have any questions or concerns before your next appointment please send [place] a message through [place] or call our office at [phone].    TO LEAVE A MESSAGE FOR THE NURSE SELECT OPTION 2, PLEASE LEAVE A MESSAGE INCLUDING: YOUR NAME DATE OF BIRTH CALL BACK NUMBER REASON FOR CALL**this is important as we prioritize the call backs  YOU WILL RECEIVE A CALL BACK THE SAME DAY AS LONG AS YOU CALL BEFORE 4:00 PM   Do the following things EVERYDAY: Weigh yourself in the morning before breakfast. Write it down and keep it in a log. Take your medicines as prescribed Eat low salt foods--Limit salt (sodium) to 2000 mg per day.  Stay as active as you can everyday Limit all fluids for the day to less than 2 liters   At the [facility], you and your health needs are our priority. As part of our continuing mission to provide you with exceptional heart care, we have created designated Provider Care Teams. These Care Teams include your primary Cardiologist (physician) and Advanced Practice Providers (APPs- Physician Assistants and Nurse Practitioners) who all work together to provide you with the care you need, when you need it.   You may see any of the following providers on your designated Care Team at your next follow up: Dr Glori Bickers Dr Haynes Kerns, NP Lyda Jester, [place] Audry Riles, PharmD   Please be sure to bring in all your medications bottles to every appointment.

## 2021-10-20 NOTE — Progress Notes (Signed)
Subjective:  Patient ID: Robert Church, male    DOB: [birth year]  Age: 65 y.o. MRN: [id number]  CC: Hyperlipidemia, Congestive Heart Failure, and Diabetes  This visit occurred during the SARS-CoV-2 public health emergency.  Safety protocols were in place, including screening questions prior to the visit, additional usage of staff PPE, and extensive cleaning of exam room while observing appropriate contact time as indicated for disinfecting solutions.    HPI Robert Church presents for f/up -  He has his chronic, baseline symptoms with no recent worsening.  He complains of fatigue, cough productive of clear thin, shortness of breath, wheezing, and lower extremity edema.  He denies chest pain, hemoptysis, fever, or chills.  He was not willing to get a flu vaccine today.  Outpatient Medications Prior to Visit  Medication Sig Dispense Refill   ADVAIR DISKUS 250-50 MCG/ACT AEPB INHALE 1 PUFF INTO THE LUNGS EVERY 12 HOURS (Patient taking differently: Inhale 1 puff into the lungs See admin instructions. Inhale 1 puff every 12 hours when out of the [facility]) 60 each 11   albuterol (PROVENTIL) (2.5 MG/3ML) 0.083% nebulizer solution Take 3 mLs (2.5 mg total) by nebulization every 4 (four) hours as needed for wheezing or shortness of breath. 75 mL 11   albuterol (VENTOLIN HFA) 108 (90 Base) MCG/ACT inhaler INHALE 2 PUFFS INTO THE LUNGS EVERY 6 HOURS AS NEEDED FOR WHEEZING 18 g 2   ALPRAZolam (XANAX) 0.25 MG tablet Take 1 tablet (0.25 mg total) by mouth 3 (three) times daily. 90 tablet 2   aspirin EC 81 MG tablet Take 81 mg by mouth daily. Swallow whole.     DALIRESP 500 MCG TABS tablet Take 250 mcg by mouth 2 (two) times daily.     EPINEPHrine 0.3 mg/0.3 mL IJ SOAJ injection Inject 0.3 mg into the muscle as needed for anaphylaxis.     FLUTICASONE FUROATE IN Inhale into the lungs as needed.     furosemide (LASIX) 40 MG tablet Take 1 tablet (40 mg total) by mouth daily. 90 tablet 3   Glucosamine HCl  (GLUCOSAMINE PO) Take 1 tablet by mouth daily.     Lidocaine, Anorectal, 5 % CREA Apply 1 application topically 2 (two) times daily.     losartan (COZAAR) 25 MG tablet Take 0.5 tablets (12.5 mg total) by mouth at bedtime. 15 tablet 3   mometasone-formoterol (DULERA) 200-5 MCG/ACT AERO Inhale 2 puffs into the lungs in the morning and at bedtime. 13 g 11   montelukast (SINGULAIR) 10 MG tablet Take 1 tablet (10 mg total) by mouth at bedtime. 30 tablet 4   nicotine (NICODERM CQ - DOSED IN MG/24 HOURS) 14 mg/24hr patch Place 14 mg onto the skin daily.     NITROGLYCERIN RE Place 1 application rectally 2 (two) times daily. 0.125%     omeprazole (PRILOSEC) 20 MG capsule TAKE 1 CAPSULE(20 MG) BY MOUTH TWICE DAILY BEFORE A MEAL 180 capsule 1   polyethylene glycol (MIRALAX / GLYCOLAX) 17 g packet Take 17 g by mouth daily as needed. 14 each 0   potassium chloride SA (KLOR-CON M20) 20 MEQ tablet Take 1 tablet (20 mEq total) by mouth daily. 90 tablet 3   predniSONE (DELTASONE) 10 MG tablet Take 1 tablet (10 mg total) by mouth daily with breakfast. 30 tablet 5   spironolactone (ALDACTONE) 25 MG tablet Take 0.5 tablets (12.5 mg total) by mouth at bedtime. 15 tablet 6   Tiotropium Bromide Monohydrate (SPIRIVA RESPIMAT) 2.5 MCG/ACT AERS Inhale  2.5 mcg into the lungs 2 (two) times daily. 4 g 11   allopurinol (ZYLOPRIM) 300 MG tablet Take 300 mg by mouth as needed.     Aspirin-Acetaminophen-Caffeine (GOODY HEADACHE PO) Take 0.5 packets by mouth daily as needed (back pain).     No facility-administered medications prior to visit.    ROS Review of Systems  Constitutional:  Positive for fatigue and unexpected weight change (wt gain). Negative for chills, diaphoresis and fever.  HENT: Negative.    Eyes: Negative.   Respiratory:  Positive for cough, shortness of breath and wheezing. Negative for chest tightness and stridor.   Cardiovascular:  Positive for leg swelling. Negative for chest pain and palpitations.   Gastrointestinal:  Negative for abdominal pain, constipation, diarrhea, nausea and vomiting.  Genitourinary: Negative.  Negative for difficulty urinating.  Musculoskeletal: Negative.  Negative for arthralgias and myalgias.  Skin: Negative.   Neurological: Negative.  Negative for dizziness, weakness and light-headedness.  Hematological:  Negative for adenopathy. Does not bruise/bleed easily.  Psychiatric/Behavioral: Negative.     Objective:  BP 136/86 (BP Location: Right Arm, Patient Position: Sitting, Cuff Size: Large)   Pulse 80   Temp 97.6 F (36.4 C) (Oral)   Ht 5\' 10"  (1.778 m)   Wt 235 lb (106.6 kg)   SpO2 93%   BMI 33.72 kg/m   BP Readings from Last 3 Encounters:  10/20/21 (!) 142/90  10/20/21 136/86  10/07/21 132/72    Wt Readings from Last 3 Encounters:  10/20/21 236 lb 6.4 oz (107.2 kg)  10/20/21 235 lb (106.6 kg)  10/07/21 226 lb (102.5 kg)    Physical Exam Vitals reviewed.  Constitutional:      General: He is not in acute distress.    Appearance: He is not ill-appearing, toxic-appearing or diaphoretic.  HENT:     Nose: Nose normal.     Mouth/Throat:     Mouth: Mucous membranes are moist.  Eyes:     Conjunctiva/sclera: Conjunctivae normal.  Cardiovascular:     Rate and Rhythm: Normal rate and regular rhythm.     Heart sounds:    Gallop present.  Pulmonary:     Effort: Pulmonary effort is normal. No tachypnea or respiratory distress.     Breath sounds: Examination of the right-middle field reveals wheezing and rhonchi. Examination of the left-middle field reveals wheezing and rhonchi. Examination of the right-lower field reveals wheezing, rhonchi and rales. Examination of the left-lower field reveals wheezing and rhonchi. Wheezing, rhonchi and rales present.  Musculoskeletal:        General: Normal range of motion.     Cervical back: Neck supple.     Right lower leg: Pitting Edema (trace) present.     Left lower leg: Pitting Edema (trace) present.   Lymphadenopathy:     Cervical: No cervical adenopathy.  Skin:    General: Skin is warm and dry.  Neurological:     General: No focal deficit present.    Lab Results  Component Value Date   WBC 9.1 09/26/2021   HGB 15.4 09/26/2021   HCT 44.4 09/26/2021   PLT 215 09/26/2021   GLUCOSE 116 (H) 09/26/2021   CHOL 191 10/20/2021   TRIG 110.0 10/20/2021   HDL 52.70 10/20/2021   LDLDIRECT 150.0 03/25/2021   LDLCALC 117 (H) 10/20/2021   ALT 23 07/23/2021   AST 35 07/23/2021   NA 142 09/26/2021   K 4.3 09/26/2021   CL 100 09/26/2021   CREATININE 1.05 09/26/2021   BUN  18 09/26/2021   CO2 26 09/26/2021   TSH 1.147 04/16/2021   PSA 1.7 07/10/2020   INR 0.9 04/15/2021   HGBA1C 5.6 10/20/2021    Narrative & Impression  CLINICAL DATA:  Follow-up pacemaker   EXAM: CHEST - 2 VIEW   COMPARISON:  07/23/2021   FINDINGS: Cardiomegaly as seen previously. Aortic atherosclerosis. Dual lead pacemaker with leads in the region of the right atrium and right ventricle. The lungs are clear. The vascularity is normal. No effusions. No acute bone finding.   IMPRESSION: Cardiomegaly. Dual lead pacemaker. Aortic atherosclerosis. No active process.     Electronically Signed   By: Nelson Chimes M.D.   On: 10/07/2021 15:11    Assessment & Plan:   Fields was seen today for hyperlipidemia, congestive heart failure and diabetes.  Diagnoses and all orders for this visit:  Hypertriglyceridemia- His triglycerides are normal now. -     Lipid panel; Future -     Lipid panel  Hyperlipidemia LDL goal <70- I recommended that he try a low-dose of a statin for cardiovascular risk reduction. -     Lipid panel; Future -     Lipid panel -     pravastatin (PRAVACHOL) 10 MG tablet; Take 1 tablet (10 mg total) by mouth daily.  Type II diabetes mellitus with manifestations ([place])- His blood sugar is very well controlled. -     Hemoglobin A1c; Future -     Hemoglobin A1c  Need for prophylactic  vaccination with combined diphtheria-tetanus-pertussis (DTP) vaccine -     Tdap (BOOSTRIX) 5-2.5-18.5 LF-MCG/0.5 injection; Inject 0.5 mLs into the muscle once for 1 dose.  Idiopathic chronic gout of foot without tophus, unspecified laterality- His UA level remains high.  I recommended that he be more compliant with allopurinol. -     Uric acid; Future -     Uric acid -     allopurinol (ZYLOPRIM) 300 MG tablet; Take 1 tablet (300 mg total) by mouth as needed.  I have discontinued Olene Craven. Sarkisyan's Aspirin-Acetaminophen-Caffeine (GOODY HEADACHE PO). I have also changed his allopurinol. Additionally, I am having him start on Boostrix and pravastatin. Lastly, I am having him maintain his polyethylene glycol, Lidocaine (Anorectal), losartan, predniSONE, Spiriva Respimat, EPINEPHrine, Daliresp, spironolactone, omeprazole, aspirin EC, NITROGLYCERIN RE, albuterol, mometasone-formoterol, albuterol, montelukast, ALPRAZolam, Advair Diskus, Glucosamine HCl (GLUCOSAMINE PO), FLUTICASONE FUROATE IN, nicotine, furosemide, and potassium chloride SA.  Meds ordered this encounter  Medications   Tdap (BOOSTRIX) 5-2.5-18.5 LF-MCG/0.5 injection    Sig: Inject 0.5 mLs into the muscle once for 1 dose.    Dispense:  0.5 mL    Refill:  0   allopurinol (ZYLOPRIM) 300 MG tablet    Sig: Take 1 tablet (300 mg total) by mouth as needed.    Dispense:  90 tablet    Refill:  0   pravastatin (PRAVACHOL) 10 MG tablet    Sig: Take 1 tablet (10 mg total) by mouth daily.    Dispense:  90 tablet    Refill:  1     Follow-up: Return in about 6 months (around 04/19/2022).  Scarlette Calico, MD

## 2021-10-21 ENCOUNTER — Telehealth: Payer: Self-pay | Admitting: Internal Medicine

## 2021-10-21 ENCOUNTER — Other Ambulatory Visit: Payer: Self-pay | Admitting: Emergency Medicine

## 2021-10-21 DIAGNOSIS — J302 Other seasonal allergic rhinitis: Secondary | ICD-10-CM

## 2021-10-21 NOTE — Telephone Encounter (Signed)
Patient calling in regarding blood results  Says he reviewed he results via mychart but wants to know about his LDL Cholesterol   Please call patient [phone]

## 2021-10-22 ENCOUNTER — Other Ambulatory Visit: Payer: Self-pay

## 2021-10-22 ENCOUNTER — Ambulatory Visit (INDEPENDENT_AMBULATORY_CARE_PROVIDER_SITE_OTHER): Payer: 59

## 2021-10-22 DIAGNOSIS — I442 Atrioventricular block, complete: Secondary | ICD-10-CM

## 2021-10-22 LAB — CUP PACEART INCLINIC DEVICE CHECK
Battery Remaining Longevity: 116 mo
Battery Voltage: 3.02 V
Brady Statistic RA Percent Paced: 5.6 %
Brady Statistic RV Percent Paced: 35 %
Date Time Interrogation Session: 20221102143921
Implantable Lead Implant Date: 20220427
Implantable Lead Implant Date: 20220427
Implantable Lead Location: 753859
Implantable Lead Location: 753860
Implantable Pulse Generator Implant Date: 20220427
Lead Channel Impedance Value: 462.5 Ohm
Lead Channel Impedance Value: 600 Ohm
Lead Channel Pacing Threshold Amplitude: 0.5 V
Lead Channel Pacing Threshold Amplitude: 0.75 V
Lead Channel Pacing Threshold Pulse Width: 0.5 ms
Lead Channel Pacing Threshold Pulse Width: 0.5 ms
Lead Channel Sensing Intrinsic Amplitude: 1.4 mV
Lead Channel Sensing Intrinsic Amplitude: 10.9 mV
Lead Channel Setting Pacing Amplitude: 2 V
Lead Channel Setting Pacing Amplitude: 2.5 V
Lead Channel Setting Pacing Pulse Width: 0.5 ms
Lead Channel Setting Sensing Sensitivity: 2 mV
Pulse Gen Model: 2272
Pulse Gen Serial Number: 3920156

## 2021-10-22 NOTE — Patient Instructions (Addendum)

## 2021-10-22 NOTE — Progress Notes (Signed)
Wound check appointment s/p failed CRT-P upgrade 10/07/21. Steri-strips removed. Wound without redness or edema. Incision edges approximated, wound well healed. Normal device function. Thresholds, sensing, and impedances consistent with previous measurements. No change in device programming at upgrade attempt. 5 PMT episodes noted, not broken with algorithm and determined to be 1:1 tachycardia at max track rate.  Histogram distribution appropriate for patient and level of activity. No mode switches. Patient enrolled in remote monitoring with next transmission scheduled 01/07/21. 91 day follow up with Dr. Curt Bears 01/09/22.

## 2021-10-22 NOTE — Telephone Encounter (Signed)
Called pt, LVM to discuss.  

## 2021-10-23 MED ORDER — PRAVASTATIN SODIUM 10 MG PO TABS: 10.0000 mg | ORAL_TABLET | Freq: Every day | ORAL | 1 refills | Status: DC

## 2021-10-30 ENCOUNTER — Telehealth: Payer: Self-pay | Admitting: Emergency Medicine

## 2021-10-30 ENCOUNTER — Telehealth: Payer: Self-pay

## 2021-10-30 ENCOUNTER — Other Ambulatory Visit: Payer: Self-pay | Admitting: Emergency Medicine

## 2021-10-30 NOTE — Telephone Encounter (Signed)
ATC to f/u on [place]. VM states the telephone belongs to [place]. Did not leave message.

## 2021-10-31 NOTE — Telephone Encounter (Signed)
Nothing else needed at this time.

## 2021-10-31 NOTE — Telephone Encounter (Signed)
Patient is returning phone call. Patient phone number is [phone].

## 2021-10-31 NOTE — Telephone Encounter (Signed)
Call returned to patient, confirmed DOB. Patient states he was calling back as we called him yesterday. I saw a message regarding his [name]. I inquired as to whether he was having trouble obtaining his [name] he said no and that he did not know if he has refills.I made him aware a script was sent in in August with 11 refills. Voiced understanding.   Will route message to [facility] in case there was something else needed.

## 2021-11-03 ENCOUNTER — Telehealth: Payer: Self-pay

## 2021-11-03 NOTE — Telephone Encounter (Signed)
Spoke with patient regarding use of tens unit at chiropractor office, informed patient that is best not to use TENS unit around pacemaker as it can interfere with pacemaker function patient voiced understanding.

## 2021-11-03 NOTE — Telephone Encounter (Signed)
Pt had a question for the nurse.

## 2021-11-05 ENCOUNTER — Other Ambulatory Visit: Payer: Self-pay | Admitting: Emergency Medicine

## 2021-11-05 ENCOUNTER — Other Ambulatory Visit: Payer: Self-pay | Admitting: Internal Medicine

## 2021-11-05 ENCOUNTER — Telehealth: Payer: Self-pay | Admitting: Internal Medicine

## 2021-11-05 DIAGNOSIS — M1009 Idiopathic gout, multiple sites: Secondary | ICD-10-CM | POA: Insufficient documentation

## 2021-11-05 MED ORDER — COLCHICINE 0.6 MG PO TABS: 0.6000 mg | ORAL_TABLET | Freq: Two times a day (BID) | ORAL | 0 refills | Status: DC

## 2021-11-05 NOTE — Telephone Encounter (Signed)
Patient calling in  Patient says he is currently having a gout flair up & wants to know if provider can send rx for Colchicine to [place], [place] Phone:  [phone]  Fax:  [phone]

## 2021-11-11 ENCOUNTER — Telehealth: Payer: Self-pay

## 2021-11-11 NOTE — Telephone Encounter (Signed)
Picture received, pt has stitch coming through.  He cannot get here by 4pm today, scheduled for device clinic on 11/12/21.

## 2021-11-11 NOTE — Telephone Encounter (Signed)
Patient called in stating there is a piece of string hanging from the inside of wound and not sure what to do with it. Patient states he will try and send a picture through [facility]

## 2021-11-11 NOTE — Telephone Encounter (Signed)
Spoke with patient.  Provided my email address for patient to send pic to.  Will call him back once picture comes through

## 2021-11-12 ENCOUNTER — Other Ambulatory Visit: Payer: Self-pay

## 2021-11-12 ENCOUNTER — Ambulatory Visit: Payer: 59

## 2021-11-12 DIAGNOSIS — Z950 Presence of cardiac pacemaker: Secondary | ICD-10-CM

## 2021-11-12 NOTE — Progress Notes (Signed)
Wound check for stitch, stitch clipped by Dr. Curt Bears, instructions given for patient to monitor site for redness, swelling or edema and to call device clinic.

## 2021-11-17 ENCOUNTER — Telehealth (INDEPENDENT_AMBULATORY_CARE_PROVIDER_SITE_OTHER): Payer: 59 | Admitting: Adult Health

## 2021-11-17 ENCOUNTER — Telehealth: Payer: Self-pay | Admitting: Emergency Medicine

## 2021-11-17 ENCOUNTER — Encounter: Payer: Self-pay | Admitting: Adult Health

## 2021-11-17 DIAGNOSIS — J449 Chronic obstructive pulmonary disease, unspecified: Secondary | ICD-10-CM | POA: Diagnosis not present

## 2021-11-17 MED ORDER — DOXYCYCLINE HYCLATE 100 MG PO TABS: 100.0000 mg | ORAL_TABLET | Freq: Two times a day (BID) | ORAL | 0 refills | Status: DC

## 2021-11-17 MED ORDER — PREDNISONE 20 MG PO TABS: 20.0000 mg | ORAL_TABLET | Freq: Every day | ORAL | 0 refills | Status: DC

## 2021-11-17 NOTE — Progress Notes (Signed)
Virtual Visit via Video Note  I connected with [name] on 11/17/21 at  4:00 PM EST by a video enabled telemedicine application and verified that I am speaking with the correct person using two identifiers.  Location: Patient: Home  Provider: Office    I discussed the limitations of evaluation and management by telemedicine and the availability of in person appointments. The patient expressed understanding and agreed to proceed.  History of Present Illness: 65 year old male followed for severe COPD and chronic respiratory failure  Today's video visit is an acute office visit. Complains of 5 days with cough, congestion , nasal congestion  and drainage, thick nasal discharge, productive cough with thick mucus, fever and body aches.  He checked COVID testing yesterday and was negative.  He has never had a COVID-vaccine.  He declines COVID-vaccine.  Patient did have COVID-19 infection in June 2022.  He also declines flu shot.  Patient says he has a good appetite.   No n/v/d. No hemoptysis . No increased edema .  On Oxygen 4l/m . O2 sats at home today are 94 %. No increased oxygen demands Is on daily prednisone at 10 mg daily. He remains on Advair and Spiriva.  Past Medical History:  Diagnosis Date   Adenomatous colon polyp    Allergy    Anal fissure    Asthma    Bronchitis    CHF (congestive heart failure) (HCC)    COPD (chronic obstructive pulmonary disease) (HCC)    Emphysema lung (HCC)    GERD (gastroesophageal reflux disease)    Hyperlipidemia    Hypertension    OSA (obstructive sleep apnea)    Pneumonia    Sinusitis    SOB (shortness of breath)     Current Outpatient Medications on File Prior to Visit  Medication Sig Dispense Refill   ADVAIR DISKUS 250-50 MCG/ACT AEPB INHALE 1 PUFF INTO THE LUNGS EVERY 12 HOURS (Patient taking differently: Inhale 1 puff into the lungs See admin instructions. Inhale 1 puff every 12 hours when out of the [facility]) 60 each 11   albuterol  (PROVENTIL) (2.5 MG/3ML) 0.083% nebulizer solution Take 3 mLs (2.5 mg total) by nebulization every 4 (four) hours as needed for wheezing or shortness of breath. 75 mL 11   albuterol (VENTOLIN HFA) 108 (90 Base) MCG/ACT inhaler INHALE 2 PUFFS INTO THE LUNGS EVERY 6 HOURS AS NEEDED FOR WHEEZING 18 g 2   allopurinol (ZYLOPRIM) 300 MG tablet Take 1 tablet (300 mg total) by mouth as needed. 90 tablet 0   ALPRAZolam (XANAX) 0.25 MG tablet Take 1 tablet (0.25 mg total) by mouth 3 (three) times daily. 90 tablet 2   aspirin EC 81 MG tablet Take 81 mg by mouth daily. Swallow whole.     colchicine 0.6 MG tablet Take 1 tablet (0.6 mg total) by mouth 2 (two) times daily. 180 tablet 0   DALIRESP 500 MCG TABS tablet Take 250 mcg by mouth 2 (two) times daily.     EPINEPHrine 0.3 mg/0.3 mL IJ SOAJ injection Inject 0.3 mg into the muscle as needed for anaphylaxis.     FLUTICASONE FUROATE IN Inhale into the lungs as needed.     furosemide (LASIX) 40 MG tablet Take 1 tablet (40 mg total) by mouth daily. 90 tablet 3   Glucosamine HCl (GLUCOSAMINE PO) Take 1 tablet by mouth daily.     Lidocaine, Anorectal, 5 % CREA Apply 1 application topically 2 (two) times daily.     losartan (COZAAR)  25 MG tablet Take 0.5 tablets (12.5 mg total) by mouth at bedtime. 15 tablet 3   mometasone-formoterol (DULERA) 200-5 MCG/ACT AERO Inhale 2 puffs into the lungs in the morning and at bedtime. 13 g 11   montelukast (SINGULAIR) 10 MG tablet Take 1 tablet (10 mg total) by mouth at bedtime. 30 tablet 4   nicotine (NICODERM CQ - DOSED IN MG/24 HOURS) 14 mg/24hr patch Place 14 mg onto the skin daily.     NITROGLYCERIN RE Place 1 application rectally 2 (two) times daily. 0.125%     omeprazole (PRILOSEC) 20 MG capsule TAKE 1 CAPSULE(20 MG) BY MOUTH TWICE DAILY BEFORE A MEAL 180 capsule 1   polyethylene glycol (MIRALAX / GLYCOLAX) 17 g packet Take 17 g by mouth daily as needed. 14 each 0   potassium chloride SA (KLOR-CON M20) 20 MEQ tablet Take  1 tablet (20 mEq total) by mouth daily. 90 tablet 3   pravastatin (PRAVACHOL) 10 MG tablet Take 1 tablet (10 mg total) by mouth daily. 90 tablet 1   predniSONE (DELTASONE) 10 MG tablet TAKE 1 TABLET(10 MG) BY MOUTH DAILY WITH BREAKFAST 30 tablet 5   spironolactone (ALDACTONE) 25 MG tablet Take 0.5 tablets (12.5 mg total) by mouth at bedtime. 15 tablet 6   Tiotropium Bromide Monohydrate (SPIRIVA RESPIMAT) 2.5 MCG/ACT AERS Inhale 2.5 mcg into the lungs 2 (two) times daily. 4 g 11   No current facility-administered medications on file prior to visit.      Observations/Objective: O2 saturations 94% on 4 L  Assessment and Plan: Acute COPD exacerbation.  Suspicious for possible influenza.  Patient is on day 5 of symptoms. Doubt Tamiflu would be beneficial .  We will treat with empiric antibiotics and short prednisone burst.  Patient is to take Tylenol as needed.  Fluids and rest.  Continue on current maintenance regimen. Needs close follow-up.  Patient is advised if symptoms or not improving or worsen will need to seek emergency room care.  Plan  Patient Instructions  Doxycycline 100mg  Twice daily  for 1 week, take with food.  Mucinex DM Twice daily  As needed  cough/congestion  Fluids and rest.  Tylenol As needed   Continue on Advair and Spiriva .  Increase Prednisone 20mg  daily for 1 week, take with food, then resume 10mg  daily.  Albuterol inhaler or neb As needed   Continue on Oxygen 4l/m .  Follow up with [name]  in 1-2 weeks and As needed   Please contact office for sooner follow up if symptoms do not improve or worsen or seek emergency care       Follow Up Instructions:    I discussed the assessment and treatment plan with the patient. The patient was provided an opportunity to ask questions and all were answered. The patient agreed with the plan and demonstrated an understanding of the instructions.   The patient was advised to call back or seek an in-person evaluation if  the symptoms worsen or if the condition fails to improve as anticipated.  I provided 30  minutes of non-face-to-face time during this encounter.   Robert Edison, NP

## 2021-11-17 NOTE — Telephone Encounter (Signed)
Can see today at 4 in person or video visit   Please contact office for sooner follow up if symptoms do not improve or worsen or seek emergency care

## 2021-11-17 NOTE — Telephone Encounter (Signed)
Spoke with the pt and notified of response per TP  He verbalized understanding  Video visit scheduled with TP for 4 pm today

## 2021-11-17 NOTE — Telephone Encounter (Signed)
Spoke with the pt  He is c/o increased cough for the past 5 days  He states cough had been non prod, but overnight he started coughing up yellow sputum  He also has some yellow nasal d/c  He states also wheezing and slight increased SOB  He tested himself for covid at home today and this was neg  He had fever last visit- took tylenol and now afebrile  He is on pred 10 mg daily as maintenance and takes spiriva and dulera  He is asking for something to be called in  Please advise, thanks!  Allergies  Allergen Reactions   Clindamycin Other (See Comments)    Tongue swelling   Doxycycline     Severe stomach upset per patient   E-Mycin [Erythromycin Base] Swelling   Jardiance [Empagliflozin] Nausea Only and Other (See Comments)    Lightheadness Dizziness   Penicillins Swelling and Other (See Comments)    Childhood rxn--MD stated he "almost died" Has patient had a PCN reaction causing immediate rash, facial/tongue/throat swelling, SOB or lightheadedness with hypotension:Yes Has patient had a PCN reaction causing severe rash involving mucus membranes or skin necrosis:unsure Has patient had a PCN reaction that required hospitalization:unsure Has patient had a PCN reaction occurring within the last 10 years:No If all of the above answers are "NO", then may proceed with Cephalosporin use.     Rosuvastatin     Myalgias (intolerance)   Levaquin [Levofloxacin] Rash

## 2021-11-17 NOTE — Patient Instructions (Addendum)
Doxycycline 100mg  Twice daily  for 1 week, take with food.  Mucinex DM Twice daily  As needed  cough/congestion  Fluids and rest.  Tylenol As needed   Continue on Advair and Spiriva .  Increase Prednisone 20mg  daily for 1 week, take with food, then resume 10mg  daily.  Albuterol inhaler or neb As needed   Continue on Oxygen 4l/m .  Follow up with [name]  in 1-2 weeks and As needed   Please contact office for sooner follow up if symptoms do not improve or worsen or seek emergency care

## 2021-11-18 ENCOUNTER — Telehealth: Payer: Self-pay | Admitting: Adult Health

## 2021-11-18 NOTE — Telephone Encounter (Signed)
Call from pharmacy regarding Rx For Doxycycline . Patient has multiple abx allergies. Prev had diarrhea wit Doxycycline - advised on dietary precautions , so will try Doxyccyline again .  Pt to call if diarrhea develops   Called pharmacy back but was unable to get thorugh   Please call pharmacy and okay rx for Doxcy.

## 2021-11-18 NOTE — Telephone Encounter (Signed)
Call made to pharmacy, confirmed patient DOB. Made aware okay to fill doxycycline. Voiced understanding.   Patient has picked up medication.   Nothing further needed at this time.

## 2021-11-19 ENCOUNTER — Telehealth: Payer: Self-pay | Admitting: Internal Medicine

## 2021-11-19 NOTE — Telephone Encounter (Signed)
Pt. Requesting Tamiflu. Suspects he may have the virus. No appt available with PCP or any other provider today. Scheduled available appt with another provider tomorrow.    Callback #- [phone]

## 2021-11-20 ENCOUNTER — Encounter: Payer: Self-pay | Admitting: Nurse Practitioner

## 2021-11-20 ENCOUNTER — Telehealth: Payer: Self-pay

## 2021-11-20 ENCOUNTER — Other Ambulatory Visit: Payer: Self-pay

## 2021-11-20 ENCOUNTER — Telehealth (INDEPENDENT_AMBULATORY_CARE_PROVIDER_SITE_OTHER): Payer: 59 | Admitting: Nurse Practitioner

## 2021-11-20 VITALS — BP 127/84 | HR 95 | Temp 98.7°F

## 2021-11-20 DIAGNOSIS — R6889 Other general symptoms and signs: Secondary | ICD-10-CM

## 2021-11-20 DIAGNOSIS — Z20828 Contact with and (suspected) exposure to other viral communicable diseases: Secondary | ICD-10-CM | POA: Diagnosis not present

## 2021-11-20 MED ORDER — OSELTAMIVIR PHOSPHATE 75 MG PO CAPS: 75.0000 mg | ORAL_CAPSULE | Freq: Two times a day (BID) | ORAL | 0 refills | Status: DC

## 2021-11-20 NOTE — Assessment & Plan Note (Signed)
Patient experiencing flulike symptoms.  Given his history of lung disease and symptoms did discuss about going and treating patient he was in agreement.  He did test for COVID twice and was negative.  Tamiflu precautions discussed.  Signs and symptoms reviewed when to seek urgent or emergent health care.

## 2021-11-20 NOTE — Telephone Encounter (Signed)
Spoke with patient. Patient states Covid test was negative. Please let patient know if Tamiflu will be sent in please

## 2021-11-20 NOTE — Telephone Encounter (Signed)
Patient advised. Per Romilda Garret Tamiflu sent in to the pharmacy. Patient will let us know if there are any issues with getting this medication

## 2021-11-20 NOTE — Assessment & Plan Note (Signed)
States one of his friends have the flu

## 2021-11-20 NOTE — Progress Notes (Signed)
Patient ID: Robert Church, male    DOB: [birth year], 65 y.o.   MRN: [id number]  Virtual visit completed through [place], a video enabled telemedicine application. Due to national recommendations of social distancing due to COVID-19, a virtual visit is felt to be most appropriate for this patient at this time. Reviewed limitations, risks, security and privacy concerns of performing a virtual visit and the availability of in person appointments. I also reviewed that there may be a patient responsible charge related to this service. The patient agreed to proceed.   Patient location: home Provider location: [place] [facility], office Persons participating in this virtual visit: patient, provider   If any vitals were documented, they were collected by patient at home unless specified below.    BP 127/84   Pulse 95 Comment: vitals per patient  Temp 98.7 F (37.1 C)   SpO2 92%    CC: Cough Subjective:   HPI: Robert Church is a 65 y.o. male presenting on 11/20/2021 for Cough (Sx on  11/18/21. Coughing up yellow phlegm, blowing out yellow mucus, fever 101, body aches, chills. No headache or sore throat. Covid test negative on 11/18/21.)   Symptoms started on 11/18/2021 Covid test 11/18/2021 was negative. Repeated test today and was negative No covid vaccines One of his friends has the flu No flu vaccine Coricidin HBP, not great relief     Relevant past medical, surgical, family and social history reviewed and updated as indicated. Interim medical history since our last visit reviewed. Allergies and medications reviewed and updated. Outpatient Medications Prior to Visit  Medication Sig Dispense Refill   ADVAIR DISKUS 250-50 MCG/ACT AEPB INHALE 1 PUFF INTO THE LUNGS EVERY 12 HOURS (Patient taking differently: Inhale 1 puff into the lungs See admin instructions. Inhale 1 puff every 12 hours when out of the [facility]) 60 each 11   albuterol (PROVENTIL) (2.5 MG/3ML) 0.083% nebulizer  solution Take 3 mLs (2.5 mg total) by nebulization every 4 (four) hours as needed for wheezing or shortness of breath. 75 mL 11   albuterol (VENTOLIN HFA) 108 (90 Base) MCG/ACT inhaler INHALE 2 PUFFS INTO THE LUNGS EVERY 6 HOURS AS NEEDED FOR WHEEZING 18 g 2   allopurinol (ZYLOPRIM) 300 MG tablet Take 1 tablet (300 mg total) by mouth as needed. 90 tablet 0   ALPRAZolam (XANAX) 0.25 MG tablet Take 1 tablet (0.25 mg total) by mouth 3 (three) times daily. 90 tablet 2   aspirin EC 81 MG tablet Take 81 mg by mouth daily. Swallow whole.     colchicine 0.6 MG tablet Take 1 tablet (0.6 mg total) by mouth 2 (two) times daily. 180 tablet 0   DALIRESP 500 MCG TABS tablet Take 250 mcg by mouth 2 (two) times daily.     doxycycline (VIBRA-TABS) 100 MG tablet Take 1 tablet (100 mg total) by mouth 2 (two) times daily. 14 tablet 0   EPINEPHrine 0.3 mg/0.3 mL IJ SOAJ injection Inject 0.3 mg into the muscle as needed for anaphylaxis.     fluticasone (FLONASE) 50 MCG/ACT nasal spray Place into both nostrils daily.     furosemide (LASIX) 40 MG tablet Take 1 tablet (40 mg total) by mouth daily. 90 tablet 3   Glucosamine HCl (GLUCOSAMINE PO) Take 1 tablet by mouth daily.     Lidocaine, Anorectal, 5 % CREA Apply 1 application topically 2 (two) times daily.     losartan (COZAAR) 25 MG tablet Take 0.5 tablets (12.5 mg total) by  mouth at bedtime. 15 tablet 3   mometasone-formoterol (DULERA) 200-5 MCG/ACT AERO Inhale 2 puffs into the lungs in the morning and at bedtime. 13 g 11   nicotine (NICODERM CQ - DOSED IN MG/24 HOURS) 14 mg/24hr patch Place 14 mg onto the skin daily.     omeprazole (PRILOSEC) 20 MG capsule TAKE 1 CAPSULE(20 MG) BY MOUTH TWICE DAILY BEFORE A MEAL 180 capsule 1   polyethylene glycol (MIRALAX / GLYCOLAX) 17 g packet Take 17 g by mouth daily as needed. 14 each 0   potassium chloride SA (KLOR-CON M20) 20 MEQ tablet Take 1 tablet (20 mEq total) by mouth daily. 90 tablet 3   pravastatin (PRAVACHOL) 10 MG  tablet Take 1 tablet (10 mg total) by mouth daily. 90 tablet 1   predniSONE (DELTASONE) 20 MG tablet Take 1 tablet (20 mg total) by mouth daily with breakfast. 7 tablet 0   spironolactone (ALDACTONE) 25 MG tablet Take 0.5 tablets (12.5 mg total) by mouth at bedtime. 15 tablet 6   Tiotropium Bromide Monohydrate (SPIRIVA RESPIMAT) 2.5 MCG/ACT AERS Inhale 2.5 mcg into the lungs 2 (two) times daily. 4 g 11   montelukast (SINGULAIR) 10 MG tablet Take 1 tablet (10 mg total) by mouth at bedtime. 30 tablet 4   predniSONE (DELTASONE) 10 MG tablet TAKE 1 TABLET(10 MG) BY MOUTH DAILY WITH BREAKFAST (Patient not taking: Reported on 11/20/2021) 30 tablet 5   FLUTICASONE FUROATE IN Inhale into the lungs as needed.     NITROGLYCERIN RE Place 1 application rectally 2 (two) times daily. 0.125%     No facility-administered medications prior to visit.     Per HPI unless specifically indicated in ROS section below Review of Systems  Constitutional:  Positive for chills, fatigue and fever.  HENT:  Positive for congestion. Negative for ear pain (right stopped up), sinus pressure, sinus pain and sore throat.   Respiratory:  Positive for cough (yellow) and shortness of breath (DOE).   Cardiovascular:  Positive for chest pain (coughing).  Gastrointestinal:  Positive for nausea. Negative for abdominal pain, diarrhea and vomiting.  Musculoskeletal:  Positive for myalgias.  Neurological:  Negative for headaches.  Objective:  BP 127/84   Pulse 95 Comment: vitals per patient  Temp 98.7 F (37.1 C)   SpO2 92%   Wt Readings from Last 3 Encounters:  10/20/21 236 lb 6.4 oz (107.2 kg)  10/20/21 235 lb (106.6 kg)  10/07/21 226 lb (102.5 kg)       Physical exam: Gen: alert, NAD, not ill appearing Pulm: speaks in complete sentences without increased work of breathing Psych: normal mood, normal thought content      Results for orders placed or performed in visit on 10/22/21  CUP PACEART [facility] DEVICE CHECK   Result Value Ref Range   Date Time Interrogation Session [phone]    Pulse Generator Manufacturer [employer]    Pulse Gen Model 2272 Assurity MRI    Pulse Gen Serial Number [id number]    Clinic Name [facility] Pulse Generator Type Implantable Pulse Generator    Implantable Pulse Generator Implant Date [id number]    Implantable Lead Manufacturer [facility]    Implantable Lead Model LPA1200M Tendril MRI    Implantable Lead Serial Number [id number]    Implantable Lead Implant Date 25427062    Implantable Lead Location Detail 1 UNKNOWN    Implantable Lead Location [id number]    Implantable Lead Manufacturer [employer]    Implantable Lead Model LPA1200M Tendril MRI  Implantable Lead Serial Number [id number]    Implantable Lead Implant Date [id number]    Implantable Lead Location Detail 1 UNKNOWN    Implantable Lead Location [phone]    Lead Channel Setting Sensing Sensitivity 2.0 mV   Lead Channel Setting Pacing Amplitude 2.0 V   Lead Channel Setting Pacing Pulse Width 0.5 ms   Lead Channel Setting Pacing Amplitude 2.5 V   Lead Channel Impedance Value 462.5 ohm   Lead Channel Sensing Intrinsic Amplitude 1.4 mV   Lead Channel Pacing Threshold Amplitude 0.75 V   Lead Channel Pacing Threshold Pulse Width 0.5 ms   Lead Channel Impedance Value 600.0 ohm   Lead Channel Sensing Intrinsic Amplitude 10.9 mV   Lead Channel Pacing Threshold Amplitude 0.5 V   Lead Channel Pacing Threshold Pulse Width 0.5 ms   Battery Status Unknown    Battery Remaining Longevity 116 mo   Battery Voltage 3.02 V   Brady Statistic RA Percent Paced 5.6 %   Brady Statistic RV Percent Paced 35.0 %   Eval Rhythm SR 89    Assessment & Plan:   Problem List Items Addressed This Visit       Other   Exposure to the flu - Primary    States one of his friends have the flu      Relevant Medications   oseltamivir (TAMIFLU) 75 MG capsule   Flu-like symptoms    Patient experiencing flulike symptoms.  Given his history  of lung disease and symptoms did discuss about going and treating patient he was in agreement.  He did test for COVID twice and was negative.  Tamiflu precautions discussed.  Signs and symptoms reviewed when to seek urgent or emergent health care.      Relevant Medications   oseltamivir (TAMIFLU) 75 MG capsule     No orders of the defined types were placed in this encounter.  No orders of the defined types were placed in this encounter.   I discussed the assessment and treatment plan with the patient. The patient was provided an opportunity to ask questions and all were answered. The patient agreed with the plan and demonstrated an understanding of the instructions. The patient was advised to call back or seek an in-person evaluation if the symptoms worsen or if the condition fails to improve as anticipated.  Follow up plan: No follow-ups on file.  Romilda Garret, NP

## 2021-11-24 ENCOUNTER — Other Ambulatory Visit: Payer: Self-pay

## 2021-11-24 ENCOUNTER — Telehealth: Payer: Self-pay | Admitting: Emergency Medicine

## 2021-11-24 ENCOUNTER — Encounter: Payer: Self-pay | Admitting: Adult Health

## 2021-11-24 ENCOUNTER — Telehealth: Payer: Self-pay

## 2021-11-24 ENCOUNTER — Ambulatory Visit (INDEPENDENT_AMBULATORY_CARE_PROVIDER_SITE_OTHER): Payer: 59 | Admitting: Adult Health

## 2021-11-24 ENCOUNTER — Ambulatory Visit (INDEPENDENT_AMBULATORY_CARE_PROVIDER_SITE_OTHER): Payer: 59

## 2021-11-24 DIAGNOSIS — J449 Chronic obstructive pulmonary disease, unspecified: Secondary | ICD-10-CM | POA: Diagnosis not present

## 2021-11-24 DIAGNOSIS — J189 Pneumonia, unspecified organism: Secondary | ICD-10-CM

## 2021-11-24 DIAGNOSIS — I5042 Chronic combined systolic (congestive) and diastolic (congestive) heart failure: Secondary | ICD-10-CM

## 2021-11-24 DIAGNOSIS — J9611 Chronic respiratory failure with hypoxia: Secondary | ICD-10-CM | POA: Diagnosis not present

## 2021-11-24 MED ORDER — DOXYCYCLINE HYCLATE 100 MG PO TABS: 100.0000 mg | ORAL_TABLET | Freq: Two times a day (BID) | ORAL | 0 refills | Status: DC

## 2021-11-24 MED ORDER — PREDNISONE 20 MG PO TABS: 20.0000 mg | ORAL_TABLET | Freq: Every day | ORAL | 0 refills | Status: DC

## 2021-11-24 NOTE — Patient Instructions (Addendum)
Extend Doxycycline for 3 days,  take with food.  Mucinex DM Twice daily  As needed  cough/congestion  Fluids and rest.  Tylenol As needed   Continue on Advair and Spiriva .  Continue on Prednisone 20mg  daily for 5 days  take with food, then resume 10mg  daily.  Albuterol inhaler or neb As needed   Continue on Oxygen 4l/m as needed to keep Oxygen Levels >88-90%.  Continue on BIPAP At bedtime  with Oxygen 4l/m  Got to quit smoking .  Follow up with [name]  in 3-4 weeks with Chest xray and As needed   Please contact office for sooner follow up if symptoms do not improve or worsen or seek emergency care

## 2021-11-24 NOTE — Assessment & Plan Note (Signed)
Superimposed pneumonia with recent viral illness.  Extend antibiotics out for additional 3 days.  Plan  Patient Instructions  Extend Doxycycline for 3 days,  take with food.  Mucinex DM Twice daily  As needed  cough/congestion  Fluids and rest.  Tylenol As needed   Continue on Advair and Spiriva .  Continue on Prednisone 20mg  daily for 5 days  take with food, then resume 10mg  daily.  Albuterol inhaler or neb As needed   Continue on Oxygen 4l/m as needed to keep Oxygen Levels >88-90%.  Continue on BIPAP At bedtime  with Oxygen 4l/m  Got to quit smoking .  Follow up with [name]  in 3-4 weeks with Chest xray and As needed   Please contact office for sooner follow up if symptoms do not improve or worsen or seek emergency care

## 2021-11-24 NOTE — Assessment & Plan Note (Signed)
Appears compensated without evidence of acute volume overload on exam.  Continue current regimen

## 2021-11-24 NOTE — Telephone Encounter (Signed)
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 Patient stated that he had the flu last week and was recording low oxygen saturations, advised patient to call his pulmonary MD today regarding his low 02 sats, patient voiced understanding.

## 2021-11-24 NOTE — Telephone Encounter (Signed)
Message from prior encounter that was closed: [place], [place]  [place] 1 hour ago (8:21 AM)   Pt states oxygen level is running around 84/85 since having the flu. States he normally is in the 90s. Wants to know what to do.   Incoming call    Called and spoke with pt who states that his oxygen sats have been dropping to the mid 80s when his sats are normally in the 90s. Pt said that this has mainly been happening at night and he states that he does wear BIPAP and O2 at night. Pt said this started happening after he came down with the flu.  Pt said that he finished taking the recent meds prescribed yesterday 12/4. States that he has not had any fever since first beginning the meds on 11/28.  I advised pt that he needed to go to UC to be evaluated but pt wanted to know if he might be able to come into our office for an appt instead of having to go to UC. Since pt recently had virtual visit with TP, sending this to her. Tammy, please advise if you would be okay with pt coming into the office for an appt since he is finished with the recent meds prescribed.

## 2021-11-24 NOTE — Telephone Encounter (Signed)
Attempted to call pt but unable to reach. Left message for him to return call. °

## 2021-11-24 NOTE — Telephone Encounter (Signed)
Patient called in stating his oxygen has been low and feeling fatigue and wanted to know if anything was seen on his recent transmission. Patient also stated he went through a metal detector and wants to make sure it didn't mess anything up

## 2021-11-24 NOTE — Telephone Encounter (Signed)
Called and spoke with pt letting him know that TP said to schedule him an appt with  her this afternoon with him having a cxr prior and he verbalized understanding. Pt has been told to arrive at 3pm for cxr and that we were scheduling him for OV with TP at 3:30. Order for stat cxr has been placed and appt scheduled for pt. Nothing further needed.

## 2021-11-24 NOTE — Progress Notes (Signed)
@Patient  ID: Robert Church, male    DOB: [birth year], 65 y.o.   MRN: [id number]  Chief Complaint  Patient presents with   Acute Visit    Referring provider: Janith Lima, MD  HPI: 65 year old male active smoker followed for severe COPD with chronic bronchitic phenotype and chronic respiratory failure Medical history significant for systolic and diastolic heart failure  TEST/EVENTS :   11/24/2021 Acute OV : COPD  Patient presents for a follow-up visit.  Patient was seen last week via video visit November 17, 2021 for acute respiratory symptoms.  Patient had 5 days of cough congestion drainage fever and body aches.  COVID home testing was negative.  Patient was felt to have an underlying viral illness most likely influenza.  He declined influenza vaccine and COVID-vaccine. Patient was given doxycycline and a prednisone burst for suspected COPD exacerbation and probable influenza.  Patient says he continued to have ongoing symptoms was seen by primary care provider on November 20, 2021 and given Tamiflu.  Patient says he continues to have some ongoing symptoms does feel some better but continues to have cough with thick mucus.  Chest x-ray today in the office shows really good opacity in the mid chest on lateral view.  Suspicious for underlying pneumonia.  Patient has multiple antibiotic allergies.  Patient says he is tolerating doxycycline.  Has had no stomach upset as he has had in the past. Patient is on oxygen 4 L at home.  Says that he only uses it when he needs it.  He does use BiPAP with oxygen at bedtime.  Patient says that at nighttime when he takes off his BiPAP and oxygen his sats can go down into the mid to upper 80s.  Patient is advised on oxygen compliance.  Today in the office he did not bring in his oxygen.  O2 saturations were 94% on room air.   Allergies  Allergen Reactions   Clindamycin Other (See Comments)    Tongue swelling   Doxycycline     Severe stomach upset per  patient   E-Mycin [Erythromycin Base] Swelling   Jardiance [Empagliflozin] Nausea Only and Other (See Comments)    Lightheadness Dizziness   Penicillins Swelling and Other (See Comments)    Childhood rxn--MD stated he "almost died" Has patient had a PCN reaction causing immediate rash, facial/tongue/throat swelling, SOB or lightheadedness with hypotension:Yes Has patient had a PCN reaction causing severe rash involving mucus membranes or skin necrosis:unsure Has patient had a PCN reaction that required hospitalization:unsure Has patient had a PCN reaction occurring within the last 10 years:No If all of the above answers are "NO", then may proceed with Cephalosporin use.     Rosuvastatin     Myalgias (intolerance)   Levaquin [Levofloxacin] Rash    Immunization History  Administered Date(s) Administered   PNEUMOCOCCAL CONJUGATE-20 08/20/2021   Tdap 12/03/2011    Past Medical History:  Diagnosis Date   Adenomatous colon polyp    Allergy    Anal fissure    Asthma    Bronchitis    CHF (congestive heart failure) (HCC)    COPD (chronic obstructive pulmonary disease) (HCC)    Emphysema lung (HCC)    GERD (gastroesophageal reflux disease)    Hyperlipidemia    Hypertension    OSA (obstructive sleep apnea)    Pneumonia    Sinusitis    SOB (shortness of breath)     Tobacco History: Social History   Tobacco Use  Smoking Status Every  Day   Packs/day: 0.25   Years: 46.00   Pack years: 11.50   Types: Cigarettes  Smokeless Tobacco Never  Tobacco Comments   Smoking very little if he even has one in a week.  Does not even want one.   Ready to quit: Not Answered Counseling given: Not Answered Tobacco comments: Smoking very little if he even has one in a week.  Does not even want one.   Outpatient Medications Prior to Visit  Medication Sig Dispense Refill   ADVAIR DISKUS 250-50 MCG/ACT AEPB INHALE 1 PUFF INTO THE LUNGS EVERY 12 HOURS (Patient taking differently: Inhale 1  puff into the lungs See admin instructions. Inhale 1 puff every 12 hours when out of the [facility]) 60 each 11   albuterol (PROVENTIL) (2.5 MG/3ML) 0.083% nebulizer solution Take 3 mLs (2.5 mg total) by nebulization every 4 (four) hours as needed for wheezing or shortness of breath. 75 mL 11   albuterol (VENTOLIN HFA) 108 (90 Base) MCG/ACT inhaler INHALE 2 PUFFS INTO THE LUNGS EVERY 6 HOURS AS NEEDED FOR WHEEZING 18 g 2   allopurinol (ZYLOPRIM) 300 MG tablet Take 1 tablet (300 mg total) by mouth as needed. 90 tablet 0   ALPRAZolam (XANAX) 0.25 MG tablet Take 1 tablet (0.25 mg total) by mouth 3 (three) times daily. 90 tablet 2   aspirin EC 81 MG tablet Take 81 mg by mouth daily. Swallow whole.     colchicine 0.6 MG tablet Take 1 tablet (0.6 mg total) by mouth 2 (two) times daily. 180 tablet 0   DALIRESP 500 MCG TABS tablet Take 250 mcg by mouth 2 (two) times daily.     EPINEPHrine 0.3 mg/0.3 mL IJ SOAJ injection Inject 0.3 mg into the muscle as needed for anaphylaxis.     fluticasone (FLONASE) 50 MCG/ACT nasal spray Place into both nostrils daily.     furosemide (LASIX) 40 MG tablet Take 1 tablet (40 mg total) by mouth daily. 90 tablet 3   Glucosamine HCl (GLUCOSAMINE PO) Take 1 tablet by mouth daily.     Lidocaine, Anorectal, 5 % CREA Apply 1 application topically 2 (two) times daily.     losartan (COZAAR) 25 MG tablet Take 0.5 tablets (12.5 mg total) by mouth at bedtime. 15 tablet 3   mometasone-formoterol (DULERA) 200-5 MCG/ACT AERO Inhale 2 puffs into the lungs in the morning and at bedtime. 13 g 11   nicotine (NICODERM CQ - DOSED IN MG/24 HOURS) 14 mg/24hr patch Place 14 mg onto the skin daily.     omeprazole (PRILOSEC) 20 MG capsule TAKE 1 CAPSULE(20 MG) BY MOUTH TWICE DAILY BEFORE A MEAL 180 capsule 1   polyethylene glycol (MIRALAX / GLYCOLAX) 17 g packet Take 17 g by mouth daily as needed. 14 each 0   potassium chloride SA (KLOR-CON M20) 20 MEQ tablet Take 1 tablet (20 mEq total) by mouth  daily. (Patient taking differently: Take 20 mEq by mouth every other day.) 90 tablet 3   predniSONE (DELTASONE) 10 MG tablet TAKE 1 TABLET(10 MG) BY MOUTH DAILY WITH BREAKFAST 30 tablet 5   spironolactone (ALDACTONE) 25 MG tablet Take 0.5 tablets (12.5 mg total) by mouth at bedtime. (Patient taking differently: Take 12.5 mg by mouth every other day.) 15 tablet 6   Tiotropium Bromide Monohydrate (SPIRIVA RESPIMAT) 2.5 MCG/ACT AERS Inhale 2.5 mcg into the lungs 2 (two) times daily. 4 g 11   montelukast (SINGULAIR) 10 MG tablet Take 1 tablet (10 mg total) by mouth at  bedtime. 30 tablet 4   doxycycline (VIBRA-TABS) 100 MG tablet Take 1 tablet (100 mg total) by mouth 2 (two) times daily. (Patient not taking: Reported on 11/24/2021) 14 tablet 0   oseltamivir (TAMIFLU) 75 MG capsule Take 1 capsule (75 mg total) by mouth 2 (two) times daily for 5 days. (Patient not taking: Reported on 11/24/2021) 10 capsule 0   pravastatin (PRAVACHOL) 10 MG tablet Take 1 tablet (10 mg total) by mouth daily. (Patient not taking: Reported on 11/24/2021) 90 tablet 1   predniSONE (DELTASONE) 20 MG tablet Take 1 tablet (20 mg total) by mouth daily with breakfast. (Patient not taking: Reported on 11/24/2021) 7 tablet 0   No facility-administered medications prior to visit.     Review of Systems:   Constitutional:   No  weight loss, night sweats,  Fevers, chills, + fatigue, or  lassitude.  HEENT:   No headaches,  Difficulty swallowing,  Tooth/dental problems, or  Sore throat,                No sneezing, itching, ear ache, nasal congestion, post nasal drip,   CV:  No chest pain,  Orthopnea, PND, swelling in lower extremities, anasarca, dizziness, palpitations, syncope.   GI  No heartburn, indigestion, abdominal pain, nausea, vomiting, diarrhea, change in bowel habits, loss of appetite, bloody stools.   Resp:  No chest wall deformity  Skin: no rash or lesions.  GU: no dysuria, change in color of urine, no urgency or  frequency.  No flank pain, no hematuria   MS:  No joint pain or swelling.  No decreased range of motion.  No back pain.    Physical Exam  BP 130/90 (BP Location: Left Arm, Patient Position: Sitting, Cuff Size: Normal)   Pulse 81   Temp 98 F (36.7 C) (Oral)   Ht 5\' 10"  (1.778 m)   Wt 234 lb (106.1 kg)   BMI 33.58 kg/m   GEN: A/Ox3; pleasant , NAD, elderly    HEENT:  [place]/AT,  NOSE-clear, THROAT-clear, no lesions, no postnasal drip or exudate noted.   NECK:  Supple w/ fair ROM; no JVD; normal carotid impulses w/o bruits; no thyromegaly or nodules palpated; no lymphadenopathy.    RESP  Clear  P & A; w/o, wheezes/ rales/ or rhonchi. no accessory muscle use, no dullness to percussion  CARD:  RRR, no m/r/g, 1+peripheral edema, pulses intact, no cyanosis or clubbing.  GI:   Soft & nt; nml bowel sounds; no organomegaly or masses detected.   Musco: Warm bil, no deformities or joint swelling noted.   Neuro: alert, no focal deficits noted.    Skin: Warm, no lesions or rashes    Lab Results:  CBC    Component Value Date/Time   WBC 9.1 09/26/2021 1318   WBC 9.2 08/14/2021 1106   RBC 4.66 09/26/2021 1318   RBC 4.57 08/14/2021 1106   HGB 15.4 09/26/2021 1318   HCT 44.4 09/26/2021 1318   PLT 215 09/26/2021 1318   MCV 95 09/26/2021 1318   MCH 33.0 09/26/2021 1318   MCH 33.0 08/14/2021 1106   MCHC 34.7 09/26/2021 1318   MCHC 33.8 08/14/2021 1106   RDW 12.6 09/26/2021 1318   LYMPHSABS 0.7 09/26/2021 1318   MONOABS 0.5 07/23/2021 0317   EOSABS 0.0 09/26/2021 1318   BASOSABS 0.0 09/26/2021 1318    BMET    Component Value Date/Time   NA 142 09/26/2021 1318   K 4.3 09/26/2021 1318   CL 100 09/26/2021  1318   CO2 26 09/26/2021 1318   GLUCOSE 116 (H) 09/26/2021 1318   GLUCOSE 118 (H) 08/14/2021 1106   BUN 18 09/26/2021 1318   CREATININE 1.05 09/26/2021 1318   CREATININE 1.04 07/10/2020 1035   CALCIUM 9.5 09/26/2021 1318   GFRNONAA >60 08/14/2021 1106   GFRAA >60  02/14/2020 1454    BNP    Component Value Date/Time   BNP 345.2 (H) 04/15/2021 2159    ProBNP    Component Value Date/Time   PROBNP 156.0 (H) 04/09/2021 1452    Imaging: DG Chest 2 View  Result Date: 11/24/2021 CLINICAL DATA:  Decreased O2 sats.  Recent flu. EXAM: CHEST - 2 VIEW COMPARISON:  10/07/2021 and CT chest 03/14/2021. FINDINGS: Trachea is midline. Heart is enlarged. Pacemaker lead tips are in the right atrium and right ventricle. Linear atelectasis or scarring in the lingula. There is a rounded area of opacification in the anterior mid chest on the lateral view. No definite correlate on the frontal view. Lungs are otherwise clear. No pleural fluid. IMPRESSION: Rounded area of opacification in the anterior mid chest on the lateral view, not well seen on the frontal view. Also not well corroborated on 10/07/2021 although the patient's arm is somewhat obscuring this area. Consider follow-up PA and lateral views of the chest in 4 weeks to ensure resolution as a pulmonary nodule cannot be excluded. Electronically Signed   By: Lorin Picket M.D.   On: 11/24/2021 15:19      No flowsheet data found.  No results found for: NITRICOXIDE      Assessment & Plan:   COPD, group D, by GOLD 2017 classification ([place]) Acute COPD exacerbation with probable recent influenza.  Now with superimposed pneumonia.  Patient is to extend antibiotics out for additional 3 days to complete a 10-day course of doxycycline.  He will need follow-up chest x-ray in 3 to 4 weeks on return visit Patient is encouraged on smoking cessation.  Continue on Advair and Spiriva.  Plan  Patient Instructions  Extend Doxycycline for 3 days,  take with food.  Mucinex DM Twice daily  As needed  cough/congestion  Fluids and rest.  Tylenol As needed   Continue on Advair and Spiriva .  Continue on Prednisone 20mg  daily for 5 days  take with food, then resume 10mg  daily.  Albuterol inhaler or neb As needed   Continue  on Oxygen 4l/m as needed to keep Oxygen Levels >88-90%.  Continue on BIPAP At bedtime  with Oxygen 4l/m  Got to quit smoking .  Follow up with [name]  in 3-4 weeks with Chest xray and As needed   Please contact office for sooner follow up if symptoms do not improve or worsen or seek emergency care        Chronic combined systolic and diastolic CHF (congestive heart failure) ([place]) Appears compensated without evidence of acute volume overload on exam.  Continue current regimen  CAP (community acquired pneumonia) Superimposed pneumonia with recent viral illness.  Extend antibiotics out for additional 3 days.  Plan  Patient Instructions  Extend Doxycycline for 3 days,  take with food.  Mucinex DM Twice daily  As needed  cough/congestion  Fluids and rest.  Tylenol As needed   Continue on Advair and Spiriva .  Continue on Prednisone 20mg  daily for 5 days  take with food, then resume 10mg  daily.  Albuterol inhaler or neb As needed   Continue on Oxygen 4l/m as needed to keep Oxygen Levels >  88-90%.  Continue on BIPAP At bedtime  with Oxygen 4l/m  Got to quit smoking .  Follow up with [name]  in 3-4 weeks with Chest xray and As needed   Please contact office for sooner follow up if symptoms do not improve or worsen or seek emergency care        Chronic respiratory failure with hypoxia ([place]) Continue on nocturnal BiPAP with oxygen.  Continue on oxygen with activity to maintain O2 saturations greater than 88 to 90%.     Rexene Edison, NP 11/24/2021

## 2021-11-24 NOTE — Telephone Encounter (Signed)
Yes tell him to come in this evening I will seen I think I have 3:30 opening  Order chest xray stat  and have him come at 3 pm  Please contact office for sooner follow up if symptoms do not improve or worsen or seek emergency care

## 2021-11-24 NOTE — Assessment & Plan Note (Signed)
Acute COPD exacerbation with probable recent influenza.  Now with superimposed pneumonia.  Patient is to extend antibiotics out for additional 3 days to complete a 10-day course of doxycycline.  He will need follow-up chest x-ray in 3 to 4 weeks on return visit Patient is encouraged on smoking cessation.  Continue on Advair and Spiriva.  Plan  Patient Instructions  Extend Doxycycline for 3 days,  take with food.  Mucinex DM Twice daily  As needed  cough/congestion  Fluids and rest.  Tylenol As needed   Continue on Advair and Spiriva .  Continue on Prednisone 20mg  daily for 5 days  take with food, then resume 10mg  daily.  Albuterol inhaler or neb As needed   Continue on Oxygen 4l/m as needed to keep Oxygen Levels >88-90%.  Continue on BIPAP At bedtime  with Oxygen 4l/m  Got to quit smoking .  Follow up with [name]  in 3-4 weeks with Chest xray and As needed   Please contact office for sooner follow up if symptoms do not improve or worsen or seek emergency care

## 2021-11-24 NOTE — Assessment & Plan Note (Signed)
Continue on nocturnal BiPAP with oxygen.  Continue on oxygen with activity to maintain O2 saturations greater than 88 to 90%.

## 2021-11-25 ENCOUNTER — Telehealth: Payer: Self-pay | Admitting: Student

## 2021-11-25 NOTE — Telephone Encounter (Signed)
LM advising pt that Jonni Sanger would like for him to come into the office because he needs to have labwork done. He can reschedule out a few weeks until he is feeling better.

## 2021-11-25 NOTE — Telephone Encounter (Signed)
Patient has pneumonia and can't come into the office.  He would like to know if he can switch his in office visit on 12/7 to a telephone visit.

## 2021-11-26 ENCOUNTER — Telehealth (HOSPITAL_COMMUNITY): Payer: Self-pay | Admitting: *Deleted

## 2021-11-26 ENCOUNTER — Telehealth: Payer: Self-pay | Admitting: Cardiovascular Disease

## 2021-11-26 ENCOUNTER — Telehealth: Payer: Self-pay | Admitting: Adult Health

## 2021-11-26 ENCOUNTER — Ambulatory Visit: Payer: 59 | Admitting: Student

## 2021-11-26 DIAGNOSIS — I10 Essential (primary) hypertension: Secondary | ICD-10-CM

## 2021-11-26 NOTE — Telephone Encounter (Signed)
    Pt c/o swelling: STAT is pt has developed SOB within 24 hours  If swelling, where is the swelling located? Left foot and Ankle  How much weight have you gained and in what time span? Not sure. Pt said his weight fluctuates  Have you gained 3 pounds in a day or 5 pounds in a week? Not sure  Do you have a log of your daily weights (if so, list)? no  Are you currently taking a fluid pill? yes  Are you currently SOB? Yes, but patient said he had pneumonia about a week ago and has had SOB since he had pneumonia   Have you traveled recently? No  Patient wanted to know if Dr. Gwenlyn Found could put in orders to have blood work done to check his a1c and Kidney Function. He said he is not sure what is causing his foot to swell. The last time this happened he was admitted to the hospital and had 60 lbs of fluid drawn off him. He would like to avoid that if possible

## 2021-11-26 NOTE — Telephone Encounter (Signed)
Prednisone can sometimes cause edema but unusual for this to be unilateral.  If persists, remains unilateral, starts to have pain then he may need to have this evaluated.  For example may need lower extremity Doppler study to evaluate for DVT.  No clear indication to order that test at this time.  I would like for him to try to continue the prednisone if possible.

## 2021-11-26 NOTE — Telephone Encounter (Signed)
Called and spoke with patient, advised of recommendations per [name].  Scheduled patient to f/u with Tammy on 12/04/21 at 3:30 pm, advised to arrive by 3:15 for check in.  Scheduled patient to see [name] on 12/26/2020 at 3:30 pm and advised to arrive at 3:15 for check in.  He verbalized understanding.  Nothing further needed.

## 2021-11-26 NOTE — Telephone Encounter (Signed)
Pt states left foot is swollen on top of foot and around ankle. Pt states swelling stated a day after he started taking Doxycycline and increased Prednisone. Pt denies any local tram to left foot or any swelling in right foot.  [name] please advise on weather the swelling could be r/t doxycyline and prednisone.

## 2021-11-26 NOTE — Telephone Encounter (Signed)
Pt called c/o swelling in R foot since starting doxycycline for pneumonia. Pt denies any pain, fever,but notices redness.  Pt does not think the swelling is CHF related. Spoke with Turner Daniels she doesn't think swelling is from doxy but pt should speak with prescribing office. Pt advised and will contact pulmonology.

## 2021-11-26 NOTE — Telephone Encounter (Signed)
Returned call to patient of Dr. Gwenlyn Found  He reports swelling in left foot/ankle -- minimal puffiness in RLE -- no redness, no pain, just swollen He does not have a consistent log of weights, states it is up and down He reports he is on his feet a lot He does not rest much, thus will not elevate legs often He takes diuretic MWF -- lasix 40mg  (see 10/31 CHF clinic note) He had flu >> pneumonia - on doxycyline -- swelling started not long after he started this medication He also has had some gout issues recently -- uric acid levels up -- gout in both feet  Went to [facility] a few weeks ago for leg pain -- his cartilage is almost non-existent in his left leg - got an injection   Went to pulmonary for pneumonia, had CXR, provider asked if he had issues with blood sugar so he is concerned about this  Weights 12/7 228.6 lbs  230 lbs -- after oatmeal, 1 piece of low sodium bacon, coffee, 1 bottle water  He has an appointment 12/13 with Dr. Gwenlyn Found  He would like to see if he can get labs done prior to this appointment - metabolic panel, X5Q  -- states he cannot see PCP until Jan 2023

## 2021-11-27 ENCOUNTER — Encounter (HOSPITAL_BASED_OUTPATIENT_CLINIC_OR_DEPARTMENT_OTHER): Payer: Self-pay | Admitting: Surgery

## 2021-11-27 NOTE — Progress Notes (Signed)
Reviewing pt chart for pre-op interview for surgery scheduled 12-01-2021 for Dr Dema Severin.  First , noted pt was seen by his pulmonologist 11-24-2021 dx with pneumonia, second, pt is on home oxygen, and third , pt ef 30%.  Due anesthesia guidelines for ambulatory surgery pt is not a candidate for [facility] due to ef and home oxygen. Called Dr Dema Severin office , spoke w/ triage nurse, Robert Church, informed her about pt will need to be moved to a main OR and why.  Also, FYI to office when  pt gets rescheduled he will need pulmonology and cardiology clearance.

## 2021-11-27 NOTE — Telephone Encounter (Signed)
Spoke with pt regarding labs. Per Dr. Gwenlyn Found will check BMET. Pt verbalizes understanding. Orders placed.

## 2021-11-27 NOTE — Telephone Encounter (Signed)
Follow Up:      Patient was calling to find out what was decided about his lab work.

## 2021-11-28 ENCOUNTER — Telehealth: Payer: Self-pay | Admitting: *Deleted

## 2021-11-28 ENCOUNTER — Telehealth: Payer: Self-pay | Admitting: Adult Health

## 2021-11-28 MED ORDER — PREDNISONE 10 MG PO TABS: ORAL_TABLET | ORAL | 0 refills | Status: DC

## 2021-11-28 NOTE — Telephone Encounter (Signed)
Plan  Patient Instructions  Extend Doxycycline for 3 days,  take with food.  Mucinex DM Twice daily  As needed  cough/congestion  Fluids and rest.  Tylenol As needed   Continue on Advair and Spiriva .  Continue on Prednisone 20mg  daily for 5 days  take with food, then resume 10mg  daily.  Albuterol inhaler or neb As needed   Continue on Oxygen 4l/m as needed to keep Oxygen Levels >88-90%.  Continue on BIPAP At bedtime  with Oxygen 4l/m  Got to quit smoking .  Follow up with [name]  in 3-4 weeks with Chest xray and As needed     Called and spoke with pt who stated that he finished taking the doxy yesterday 12/8 and stated that he took the last of the 20mg  prednisone today 12/9. Pt said that he had a lot of burning in his chest after being on the doxy and also stated that it really messed his stomach up and affected his breathing as well.  Pt said since finishing the meds, he said that he is still having a lot of chest congestion and said when he lays down at night he also has wheezing involved. Pt is also coughing but states that the phlegm is no longer yellow.  Pt uses his dulera twice a day as prescribed and the spiriva daily as prescribed. States that he uses the rescue inhaler about every 4 hours and said that he uses his nebulizer only when needed. Pt also stated that he takes the daliresp as prescribed as well as the singulair. Pt said that since he finished the 20mg  prednisone, he will be back down to the 10mg  tomorrow 12/10. Pt also is taking mucinex to see if that would help with his symptoms.  With pt still having the chest congestion and all the other symptoms, he wants to know if anything else might be able to be recommended (stated that he does not want to take doxy again due to his symptoms).  [name], please advise.

## 2021-11-28 NOTE — Telephone Encounter (Signed)
   Pre-operative Risk Assessment    Patient Name: Robert Church  DOB: [birth year] MRN: [id number]      Request for Surgical Clearance   Procedure:   ANAL FISSURE  Date of Surgery: Clearance TBD                                 Surgeon:  DR. Harrell Gave WHITE Surgeon's Group or Practice Name:  [facility] Phone number:  [phone] Fax number:  [phone] ATTN: [name], LPN   Type of Clearance Requested: - Medical  - Pharmacy:  Hold Aspirin     Type of Anesthesia:   General    Additional requests/questions:   [name]   11/28/2021, 4:44 PM

## 2021-11-28 NOTE — Telephone Encounter (Signed)
Have him take a more extended pred taper >> Take 40mg  daily for 3 days, then 30mg  daily for 3 days, then 20mg  daily for 3 days, then 10mg  daily for 3 days, then stop If he isn't improving he will need to be seen or seek urgent care over the weekend

## 2021-11-28 NOTE — Telephone Encounter (Signed)
   Patient Name: Robert Church  DOB: [birth year] MRN: [id number]  Primary Cardiologist: Quay Burow, MD  Chart reviewed as part of pre-operative protocol coverage. The patient has an upcoming appointment scheduled with Dr. Gwenlyn Found on 12/02/21 at which time this clearance can be addressed in case there are any issues that would impact surgical clearance. I added preop FYI to appointment notes so that provider is aware to address at time of OV. Will also cc to Dr. Gwenlyn Found so he is aware to address.  Per office protocol, the cardiology provider should forward their finalized clearance decision to requesting party below. I will route this message as FYI to requesting party and remove this message from the pre-op box as separate preop APP input not needed at this time.  Please call with questions.  Charlie Pitter, PA-C 11/28/2021, 4:55 PM

## 2021-11-28 NOTE — Telephone Encounter (Signed)
Spoke with pt and reviewed Dr. Agustina Caroli recommendations along with medication instructions. Pt stated understanding. Placed medication order. Nothing further needed at this time.

## 2021-12-01 ENCOUNTER — Ambulatory Visit (HOSPITAL_BASED_OUTPATIENT_CLINIC_OR_DEPARTMENT_OTHER): Admission: RE | Admit: 2021-12-01 | Payer: 59 | Source: Ambulatory Visit | Admitting: Surgery

## 2021-12-01 ENCOUNTER — Encounter (HOSPITAL_BASED_OUTPATIENT_CLINIC_OR_DEPARTMENT_OTHER): Admission: RE | Payer: Self-pay | Source: Ambulatory Visit

## 2021-12-01 HISTORY — DX: Chronic obstructive pulmonary disease, unspecified: J44.9

## 2021-12-01 HISTORY — DX: Other cardiomyopathies: I42.8

## 2021-12-01 HISTORY — DX: Dependence on supplemental oxygen: Z99.81

## 2021-12-01 SURGERY — ANAL FISTULOTOMY
Anesthesia: General

## 2021-12-02 ENCOUNTER — Other Ambulatory Visit: Payer: Self-pay

## 2021-12-02 ENCOUNTER — Ambulatory Visit (INDEPENDENT_AMBULATORY_CARE_PROVIDER_SITE_OTHER): Payer: 59 | Admitting: Cardiovascular Disease

## 2021-12-02 ENCOUNTER — Encounter: Payer: Self-pay | Admitting: Cardiovascular Disease

## 2021-12-02 DIAGNOSIS — G4733 Obstructive sleep apnea (adult) (pediatric): Secondary | ICD-10-CM | POA: Diagnosis not present

## 2021-12-02 DIAGNOSIS — I428 Other cardiomyopathies: Secondary | ICD-10-CM

## 2021-12-02 DIAGNOSIS — F17210 Nicotine dependence, cigarettes, uncomplicated: Secondary | ICD-10-CM | POA: Diagnosis not present

## 2021-12-02 DIAGNOSIS — I442 Atrioventricular block, complete: Secondary | ICD-10-CM

## 2021-12-02 DIAGNOSIS — E785 Hyperlipidemia, unspecified: Secondary | ICD-10-CM

## 2021-12-02 DIAGNOSIS — I10 Essential (primary) hypertension: Secondary | ICD-10-CM | POA: Diagnosis not present

## 2021-12-02 NOTE — Patient Instructions (Signed)
Medication Instructions:   -Increase furosemide (lasix) to 80mg  Monday, Wednesday, Friday for one week.  -Increase potassium to 35meq for this week.  *If you need a refill on your cardiac medications before your next appointment, please call your pharmacy*   Follow-Up: At [facility], you and your health needs are our priority.  As part of our continuing mission to provide you with exceptional heart care, we have created designated Provider Care Teams.  These Care Teams include your primary Cardiologist (physician) and Advanced Practice Providers (APPs -  Physician Assistants and Nurse Practitioners) who all work together to provide you with the care you need, when you need it.  We recommend signing up for the patient portal called "MyChart".  Sign up information is provided on this After Visit Summary.  MyChart is used to connect with patients for Virtual Visits (Telemedicine).  Patients are able to view lab/test results, encounter notes, upcoming appointments, etc.  Non-urgent messages can be sent to your provider as well.   To learn more about what you can do with MyChart, go to [email or web address].    Your next appointment:   6 month(s)  The format for your next appointment:   In Person  Provider:   Quay Burow, MD

## 2021-12-02 NOTE — Assessment & Plan Note (Signed)
History of obstructive sleep apnea on CPAP. 

## 2021-12-02 NOTE — Assessment & Plan Note (Signed)
History of essential hypertension a blood pressure measured today at 124/80.  He is on low-dose losartan.

## 2021-12-02 NOTE — Assessment & Plan Note (Signed)
History of nonischemic cardiomyopathy with recent cath performed by Dr. Haroldine Laws showing clean coronary arteries 9/22.  He is on losartan but unfortunately he is not on Entresto because of financial considerations.  He also does not appear to be on a beta-blocker.  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  I am wondering whether starting carvedilol would be an option.  I will defer to Dr. Haroldine Laws.

## 2021-12-02 NOTE — Assessment & Plan Note (Signed)
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  He had attempted BiV upgrade by Dr. Curt Bears recently that was unsuccessful.

## 2021-12-02 NOTE — Assessment & Plan Note (Signed)
History of hyperlipidemia not on statin therapy with lipid profile performed 10/20/2021 revealing total cholesterol 191, LDL 117 and HDL 52.

## 2021-12-02 NOTE — Assessment & Plan Note (Signed)
Discontinue tobacco abuse

## 2021-12-02 NOTE — Telephone Encounter (Signed)
° ° °  Patient Name: Robert Church  DOB: December 15, 1956 MRN: [id number]  Primary Cardiologist: Quay Burow, MD  Chart reviewed as part of pre-operative protocol coverage. Given past medical history and time since last visit, based on ACC/AHA guidelines, Robert Church would be at acceptable risk for the planned procedure without further cardiovascular testing.   Patient may hold aspirin for 5 to 7 days prior to surgery and restart as soon as possible afterward at the surgeon's discretion.  The patient was advised that if he develops new symptoms prior to surgery to contact our office to arrange for a follow-up visit, and he verbalized understanding.  I will route this recommendation to the requesting party via Epic fax function and remove from pre-op pool.  Please call with questions.  [place], [place] 12/02/2021, 6:04 PM

## 2021-12-02 NOTE — Progress Notes (Signed)
12/02/2021 Robert Church   29-Jun-1956  [id number]  Primary Physician Janith Lima, MD Primary Cardiologist: [name] MD Garret Reddish, [place], [place]  HPI:  Robert Church is a 65 y.o.   severely overweight divorced Caucasian male father of 1 grandchild who works in [employer].  He was referred because of chest pain by Dr. Lisbeth Ply for cardiovascular evaluation.  I last saw him in the office 05/28/2021 mother is at [facility], one of my long-term patients as well.  He saw Dr. Johnsie Cancel in the past.  His risk factors include ongoing tobacco abuse, hypertension and family history.  He never had a heart attack or stroke.  He did have a heart catheterization performed by Dr. Tamala Julian on 04/19/2017 that showed normal coronary arteries with an EF of 40% consistent with a nonischemic cardia myopathy.    A 2D echo performed 07/07/2018 showed an EF of 30 to 35%.   I referred him to Dr. Haroldine Laws in the advanced heart failure clinic who changed his carvedilol to Zebeta and his losartan to [facility].  Unfortunately, he did not tolerate the Entresto and he was changed back to losartan.  He does have obstructive sleep apnea on CPAP which he wears intermittently.  He continues to exhibit class II/III symptoms.   He did well until 01/18/2020 when he was admitted with volume overload.  His 2D echo was unchanged with an EF of 40 to 45%.  He was diving 65 pounds from approximately 275 down to 210 and discharged home on torsemide and spironolactone.  He is aware of salt restriction.   He weighs himself on a daily basis.   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 Curt Bears 04/16/2021 with improvement in his symptoms.  He is followed by Dr. Haroldine Laws in the advanced heart failure clinic and is on furosemide 60 mg a day.  He does weigh himself.  He is aware of salt restriction.   He currently is not on a beta-blocker.  Since I saw him 6 months ago he has been followed by Dr. Haroldine Laws.  He had another right left heart cath again demonstrating normal coronary arteries.  Dr. Curt Bears attempted to upgrade his pacer to a BiV device unsuccessfully however.  He apparently is pursuing a Barostim device.   Current Meds  Medication Sig   ADVAIR DISKUS 250-50 MCG/ACT AEPB INHALE 1 PUFF INTO THE LUNGS EVERY 12 HOURS (Patient taking differently: Inhale 1 puff into the lungs See admin instructions. Inhale 1 puff every 12 hours when out of the [facility])   albuterol (PROVENTIL) (2.5 MG/3ML) 0.083% nebulizer solution Take 3 mLs (2.5 mg total) by nebulization every 4 (four) hours as needed for wheezing or shortness of breath.   albuterol (VENTOLIN HFA) 108 (90 Base) MCG/ACT inhaler INHALE 2 PUFFS INTO THE LUNGS EVERY 6 HOURS AS NEEDED FOR WHEEZING   allopurinol (ZYLOPRIM) 300 MG tablet Take 1 tablet (300 mg total) by mouth as needed.   ALPRAZolam (XANAX) 0.25 MG tablet Take 1 tablet (0.25 mg total) by mouth 3 (three) times daily.   colchicine 0.6 MG tablet Take 1 tablet (0.6 mg total) by mouth 2 (two) times daily.   DALIRESP 500 MCG TABS tablet Take 250 mcg by mouth 2 (two) times daily.   EPINEPHrine 0.3 mg/0.3 mL IJ SOAJ injection Inject 0.3 mg into the muscle as needed  for anaphylaxis.   fluticasone (FLONASE) 50 MCG/ACT nasal spray Place into both nostrils daily.   furosemide (LASIX) 40 MG tablet Take 1 tablet (40 mg total) by mouth daily. (Patient taking differently: Take 40 mg by mouth daily. Monday Wednesday and Friday)   Glucosamine HCl (GLUCOSAMINE PO) Take 1 tablet by mouth daily.   Lidocaine, Anorectal, 5 % CREA Apply 1 application topically 2 (two) times daily.   losartan (COZAAR) 25 MG tablet Take 0.5 tablets (12.5 mg total) by mouth at bedtime.   mometasone-formoterol (DULERA) 200-5 MCG/ACT AERO Inhale 2 puffs into the lungs in the morning and at bedtime.   montelukast  (SINGULAIR) 10 MG tablet Take 1 tablet (10 mg total) by mouth at bedtime.   nicotine (NICODERM CQ - DOSED IN MG/24 HOURS) 14 mg/24hr patch Place 14 mg onto the skin daily.   omeprazole (PRILOSEC) 20 MG capsule TAKE 1 CAPSULE(20 MG) BY MOUTH TWICE DAILY BEFORE A MEAL   potassium chloride SA (KLOR-CON M20) 20 MEQ tablet Take 1 tablet (20 mEq total) by mouth daily. (Patient taking differently: Take 20 mEq by mouth every other day.)   predniSONE (DELTASONE) 10 MG tablet TAKE 1 TABLET(10 MG) BY MOUTH DAILY WITH BREAKFAST   predniSONE (DELTASONE) 10 MG tablet Take 4 tablets X 3 days, 3 tabs X 3 days, 2 tabs x 3 days, 1 tab x 3 days   predniSONE (DELTASONE) 20 MG tablet Take 1 tablet (20 mg total) by mouth daily with breakfast.   Tiotropium Bromide Monohydrate (SPIRIVA RESPIMAT) 2.5 MCG/ACT AERS Inhale 2.5 mcg into the lungs 2 (two) times daily.     Allergies  Allergen Reactions   Clindamycin Other (See Comments)    Tongue swelling   Doxycycline     Severe stomach upset per patient   E-Mycin [Erythromycin Base] Swelling   Jardiance [Empagliflozin] Nausea Only and Other (See Comments)    Lightheadness Dizziness   Penicillins Swelling and Other (See Comments)    Childhood rxn--MD stated he "almost died" Has patient had a PCN reaction causing immediate rash, facial/tongue/throat swelling, SOB or lightheadedness with hypotension:Yes Has patient had a PCN reaction causing severe rash involving mucus membranes or skin necrosis:unsure Has patient had a PCN reaction that required hospitalization:unsure Has patient had a PCN reaction occurring within the last 10 years:No If all of the above answers are "NO", then may proceed with Cephalosporin use.     Rosuvastatin     Myalgias (intolerance)   Levaquin [Levofloxacin] Rash    Social History   Socioeconomic History   Marital status: Single    Spouse name: Not on file   Number of children: 1   Years of education: 12   Highest education level:  Not on file  Occupational History   Occupation: disabled  Tobacco Use   Smoking status: Former    Packs/day: 0.25    Years: 46.00    Pack years: 11.50    Types: Cigarettes   Smokeless tobacco: Never   Tobacco comments:    Smoking very little if he even has one in a week.  Does not even want one.  Vaping Use   Vaping Use: Never used  Substance and Sexual Activity   Alcohol use: Yes    Alcohol/week: 3.0 standard drinks    Types: 3 Cans of beer per week    Comment: 2 beers per day   Drug use: No   Sexual activity: Yes    Partners: Female    Birth control/protection: None  Other Topics Concern   Not on file  Social History Narrative   Right handed   Tea sometimes and coffee (1/2 caffeine)   Social Determinants of Health   Financial Resource Strain: Medium Risk   Difficulty of Paying Living Expenses: Somewhat hard  Food Insecurity: No Food Insecurity   Worried About Charity fundraiser in the Last Year: Never true   Ran Out of Food in the Last Year: Never true  Transportation Needs: No Transportation Needs   Lack of Transportation (Medical): No   Lack of Transportation (Non-Medical): No  Physical Activity: Not on file  Stress: Not on file  Social Connections: Not on file  Intimate Partner Violence: Not on file     Review of Systems: General: negative for chills, fever, night sweats or weight changes.  Cardiovascular: negative for chest pain, dyspnea on exertion, edema, orthopnea, palpitations, paroxysmal nocturnal dyspnea or shortness of breath Dermatological: negative for rash Respiratory: negative for cough or wheezing Urologic: negative for hematuria Abdominal: negative for nausea, vomiting, diarrhea, bright red blood per rectum, melena, or hematemesis Neurologic: negative for visual changes, syncope, or dizziness All other systems reviewed and are otherwise negative except as noted above.    Blood pressure 124/80, pulse 81, height 5\' 10"  (1.778 m), weight 233  lb 6.4 oz (105.9 kg), SpO2 94 %.  General appearance: alert and no distress Neck: no adenopathy, no carotid bruit, no JVD, supple, symmetrical, trachea midline, and thyroid not enlarged, symmetric, no tenderness/mass/nodules Lungs: clear to auscultation bilaterally Heart: regular rate and rhythm, S1, S2 normal, no murmur, click, rub or gallop Extremities: 2+ left lower extremity edema Pulses: 2+ and symmetric Skin: Skin color, texture, turgor normal. 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  I personally reviewed this EKG.  ASSESSMENT AND PLAN:   HTN (hypertension) History of essential hypertension a blood pressure measured today at 124/80.  He is on low-dose losartan.  Obstructive sleep apnea syndrome History of obstructive sleep apnea on CPAP  Cigarette smoker Discontinue tobacco abuse  NICM (nonischemic cardiomyopathy) ([place]) History of nonischemic cardiomyopathy with recent cath performed by Dr. Haroldine Laws showing clean coronary arteries 9/22.  He is on losartan but unfortunately he is not on Entresto because of financial considerations.  He also does not appear to be on a beta-blocker.  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  I am wondering whether starting carvedilol would be an option.  I will defer to Dr. Haroldine Laws.  Hyperlipidemia LDL goal <70 History of hyperlipidemia not on statin therapy with lipid profile performed 10/20/2021 revealing total cholesterol 191, LDL 117 and HDL 52.  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  He had attempted BiV upgrade by Dr. Curt Bears recently that was unsuccessful.     [name] MD FACP,FACC,FAHA, [facility] 12/02/2021 2:29 PM

## 2021-12-04 ENCOUNTER — Other Ambulatory Visit: Payer: Self-pay

## 2021-12-04 ENCOUNTER — Telehealth: Payer: Self-pay | Admitting: Emergency Medicine

## 2021-12-04 ENCOUNTER — Ambulatory Visit (INDEPENDENT_AMBULATORY_CARE_PROVIDER_SITE_OTHER): Payer: 59 | Admitting: Adult Health

## 2021-12-04 ENCOUNTER — Encounter: Payer: Self-pay | Admitting: Adult Health

## 2021-12-04 DIAGNOSIS — J9611 Chronic respiratory failure with hypoxia: Secondary | ICD-10-CM

## 2021-12-04 DIAGNOSIS — F17210 Nicotine dependence, cigarettes, uncomplicated: Secondary | ICD-10-CM

## 2021-12-04 DIAGNOSIS — Z01811 Encounter for preprocedural respiratory examination: Secondary | ICD-10-CM | POA: Insufficient documentation

## 2021-12-04 DIAGNOSIS — I5042 Chronic combined systolic (congestive) and diastolic (congestive) heart failure: Secondary | ICD-10-CM

## 2021-12-04 DIAGNOSIS — J449 Chronic obstructive pulmonary disease, unspecified: Secondary | ICD-10-CM | POA: Diagnosis not present

## 2021-12-04 DIAGNOSIS — G4733 Obstructive sleep apnea (adult) (pediatric): Secondary | ICD-10-CM

## 2021-12-04 NOTE — Telephone Encounter (Signed)
Seen in the office today for a follow-up visit.  He is clinically improving.  Does place him at a high surgical risk from a pulmonary standpoint.  Please see my note for pulmonary preop risk assessment.  He does have a follow-up with [name] next month with PFTs and chest x-ray

## 2021-12-04 NOTE — Progress Notes (Signed)
@Patient  ID: [name], male    DOB: 1956/02/01, 65 y.o.   MRN: [id number]  Chief Complaint  Patient presents with   Follow-up    Referring provider: Janith Lima, MD  HPI: 65 year old male active smoker followed for severe COPD with chronic bronchitic phenotype and chronic respiratory failure on oxygen and OSA on BIPAP  Medical history significant for systolic and diastolic heart failure, s/p PPM    TEST/EVENTS :  Low-dose CT screening CT March 14, 2021 showed lung RADS 2 benign appearance, mild emphysema, mild scarring at the left upper lobe, 4.7 mm right middle lobe nodule and a 5.8 mm right lower lobe nodule unchanged from 2019  Spirometry December 2016 showed moderate restriction with an FEV1 at 69%, ratio 75, FVC 72%, decreased mid flows  12/04/2021 Follow up ; COPD , O2 RF , OSA  Patient presents for a 2-week follow-up.  Patient developed acute respiratory symptoms 2 weeks ago with cough, congestion, drainage, fever and body aches.  COVID home testing was negative.  Patient was felt to have possible influenza but did not get tested.  He has not been vaccinated for COVID-19 or influenza as he declines.  Patient was treated with doxycycline and a prednisone burst for presumed influenza and COPD exacerbation.  Patient went to his primary care provider and was seen December 1 and given Tamiflu.  Chest x-ray showed a rounded mid lung opacity suspicious for pneumonia.  He was recommended to finish antibiotics. Says he took Doxycycline for 7 days , caused diarrhea. Has multiple antibiotic allergies/intolerances.   Patient remains on oxygen 4 L with activity . Did not come into office today with Oxygen .  O2 sats today on room air 93% .  Has had no increased oxygen demands.  He also is on BiPAP at bedtime with oxygen at 4 L.  Patient does continue to smoke.  We discussed smoking cessation.  Patient has upcoming surgery for a anal fissure..  Needs preop pulmonary risk assessment. To be  done at [facility] hospital surgical center.   Has CHF seen by cardiology this week, lasix increased for leg swelling x 3 days .   Still smoking some , has cut back . Discussed smoking cessation .     Allergies  Allergen Reactions   Clindamycin Other (See Comments)    Tongue swelling   Doxycycline     Severe stomach upset per patient   E-Mycin [Erythromycin Base] Swelling   Jardiance [Empagliflozin] Nausea Only and Other (See Comments)    Lightheadness Dizziness   Penicillins Swelling and Other (See Comments)    Childhood rxn--MD stated he "almost died" Has patient had a PCN reaction causing immediate rash, facial/tongue/throat swelling, SOB or lightheadedness with hypotension:Yes Has patient had a PCN reaction causing severe rash involving mucus membranes or skin necrosis:unsure Has patient had a PCN reaction that required hospitalization:unsure Has patient had a PCN reaction occurring within the last 10 years:No If all of the above answers are "NO", then may proceed with Cephalosporin use.     Rosuvastatin     Myalgias (intolerance)   Levaquin [Levofloxacin] Rash    Immunization History  Administered Date(s) Administered   PNEUMOCOCCAL CONJUGATE-20 08/20/2021   Tdap 12/03/2011    Past Medical History:  Diagnosis Date   Adenomatous colon polyp    Allergy    Anal fissure    Asthma    Bronchitis    CHF (congestive heart failure) (HCC)    COPD, group D, by  GOLD 2017 classification (HCC)    Emphysema lung (HCC)    GERD (gastroesophageal reflux disease)    Hyperlipidemia    Hypertension    NICM (nonischemic cardiomyopathy) (HCC)    OSA (obstructive sleep apnea)    Pneumonia    Requires supplemental oxygen    Sinusitis    SOB (shortness of breath)     Tobacco History: Social History   Tobacco Use  Smoking Status Former   Packs/day: 0.25   Years: 46.00   Pack years: 11.50   Types: Cigarettes  Smokeless Tobacco Never  Tobacco Comments   Smoking very little if  he even has one in a week.  Does not even want one.   Counseling given: Not Answered Tobacco comments: Smoking very little if he even has one in a week.  Does not even want one.   Outpatient Medications Prior to Visit  Medication Sig Dispense Refill   ADVAIR DISKUS 250-50 MCG/ACT AEPB INHALE 1 PUFF INTO THE LUNGS EVERY 12 HOURS (Patient taking differently: Inhale 1 puff into the lungs See admin instructions. Inhale 1 puff every 12 hours when out of the [facility]) 60 each 11   albuterol (PROVENTIL) (2.5 MG/3ML) 0.083% nebulizer solution Take 3 mLs (2.5 mg total) by nebulization every 4 (four) hours as needed for wheezing or shortness of breath. 75 mL 11   albuterol (VENTOLIN HFA) 108 (90 Base) MCG/ACT inhaler INHALE 2 PUFFS INTO THE LUNGS EVERY 6 HOURS AS NEEDED FOR WHEEZING 18 g 2   allopurinol (ZYLOPRIM) 300 MG tablet Take 1 tablet (300 mg total) by mouth as needed. 90 tablet 0   ALPRAZolam (XANAX) 0.25 MG tablet Take 1 tablet (0.25 mg total) by mouth 3 (three) times daily. 90 tablet 2   aspirin EC 81 MG tablet Take 81 mg by mouth daily. Swallow whole.     colchicine 0.6 MG tablet Take 1 tablet (0.6 mg total) by mouth 2 (two) times daily. 180 tablet 0   DALIRESP 500 MCG TABS tablet Take 250 mcg by mouth 2 (two) times daily.     EPINEPHrine 0.3 mg/0.3 mL IJ SOAJ injection Inject 0.3 mg into the muscle as needed for anaphylaxis.     fluticasone (FLONASE) 50 MCG/ACT nasal spray Place into both nostrils daily.     furosemide (LASIX) 40 MG tablet Take 1 tablet (40 mg total) by mouth daily. (Patient taking differently: Take 40 mg by mouth daily. Monday Wednesday and Friday) 90 tablet 3   Glucosamine HCl (GLUCOSAMINE PO) Take 1 tablet by mouth daily.     Lidocaine, Anorectal, 5 % CREA Apply 1 application topically 2 (two) times daily.     losartan (COZAAR) 25 MG tablet Take 0.5 tablets (12.5 mg total) by mouth at bedtime. 15 tablet 3   mometasone-formoterol (DULERA) 200-5 MCG/ACT AERO Inhale 2 puffs  into the lungs in the morning and at bedtime. 13 g 11   nicotine (NICODERM CQ - DOSED IN MG/24 HOURS) 14 mg/24hr patch Place 14 mg onto the skin daily.     omeprazole (PRILOSEC) 20 MG capsule TAKE 1 CAPSULE(20 MG) BY MOUTH TWICE DAILY BEFORE A MEAL 180 capsule 1   polyethylene glycol (MIRALAX / GLYCOLAX) 17 g packet Take 17 g by mouth daily as needed. 14 each 0   potassium chloride SA (KLOR-CON M20) 20 MEQ tablet Take 1 tablet (20 mEq total) by mouth daily. (Patient taking differently: Take 20 mEq by mouth every other day.) 90 tablet 3   predniSONE (DELTASONE) 10 MG  tablet Take 4 tablets X 3 days, 3 tabs X 3 days, 2 tabs x 3 days, 1 tab x 3 days 30 tablet 0   spironolactone (ALDACTONE) 25 MG tablet Take 0.5 tablets (12.5 mg total) by mouth at bedtime. 15 tablet 6   Tiotropium Bromide Monohydrate (SPIRIVA RESPIMAT) 2.5 MCG/ACT AERS Inhale 2.5 mcg into the lungs 2 (two) times daily. 4 g 11   montelukast (SINGULAIR) 10 MG tablet Take 1 tablet (10 mg total) by mouth at bedtime. 30 tablet 4   predniSONE (DELTASONE) 10 MG tablet TAKE 1 TABLET(10 MG) BY MOUTH DAILY WITH BREAKFAST (Patient not taking: Reported on 12/04/2021) 30 tablet 5   doxycycline (VIBRA-TABS) 100 MG tablet Take 1 tablet (100 mg total) by mouth 2 (two) times daily. (Patient not taking: Reported on 12/02/2021) 6 tablet 0   predniSONE (DELTASONE) 20 MG tablet Take 1 tablet (20 mg total) by mouth daily with breakfast. (Patient not taking: Reported on 12/04/2021) 5 tablet 0   No facility-administered medications prior to visit.     Review of Systems:   Constitutional:   No  weight loss, night sweats,  Fevers, chills, +atigue, or  lassitude.  HEENT:   No headaches,  Difficulty swallowing,  Tooth/dental problems, or  Sore throat,                No sneezing, itching, ear ache,  +nasal congestion, post nasal drip,   CV:  No chest pain,  Orthopnea, PND, +swelling in lower extremities,  No anasarca, dizziness, palpitations, syncope.    GI  No heartburn, indigestion, abdominal pain, nausea, vomiting, diarrhea, change in bowel habits, loss of appetite, bloody stools.   Resp:   No chest wall deformity  Skin: no rash or lesions.  GU: no dysuria, change in color of urine, no urgency or frequency.  No flank pain, no hematuria   MS:  No joint pain or swelling.  No decreased range of motion.  No back pain.    Physical Exam  BP (!) 132/58 (BP Location: Left Arm, Patient Position: Sitting, Cuff Size: Normal)    Pulse (!) 59    Temp 98.2 F (36.8 C) (Oral)    Ht 5\' 10"  (1.778 m)    Wt 227 lb 12.8 oz (103.3 kg)    SpO2 92%    BMI 32.69 kg/m   GEN: A/Ox3; pleasant , NAD, well nourished    HEENT:  [place]/AT,  , NOSE-clear, THROAT-clear, no lesions, no postnasal drip or exudate noted.  Class 2-3 MP airway   NECK:  Supple w/ fair ROM; no JVD; normal carotid impulses w/o bruits; no thyromegaly or nodules palpated; no lymphadenopathy.    RESP  Clear  P & A; w/o, wheezes/ rales/ or rhonchi. no accessory muscle use, no dullness to percussion  CARD:  RRR, no m/r/g, 1+ peripheral edema, pulses intact, no cyanosis or clubbing.  GI:   Soft & nt; nml bowel sounds; no organomegaly or masses detected.   Musco: Warm bil, no deformities or joint swelling noted.   Neuro: alert, no focal deficits noted.    Skin: Warm, no lesions or rashes    Lab Results:   BMET   BNP    Imaging: DG Chest 2 View  Result Date: 11/24/2021 CLINICAL DATA:  Decreased O2 sats.  Recent flu. EXAM: CHEST - 2 VIEW COMPARISON:  10/07/2021 and CT chest 03/14/2021. FINDINGS: Trachea is midline. Heart is enlarged. Pacemaker lead tips are in the right atrium and right ventricle. Linear atelectasis  or scarring in the lingula. There is a rounded area of opacification in the anterior mid chest on the lateral view. No definite correlate on the frontal view. Lungs are otherwise clear. No pleural fluid. IMPRESSION: Rounded area of opacification in the anterior mid  chest on the lateral view, not well seen on the frontal view. Also not well corroborated on 10/07/2021 although the patient's arm is somewhat obscuring this area. Consider follow-up PA and lateral views of the chest in 4 weeks to ensure resolution as a pulmonary nodule cannot be excluded. Electronically Signed   By: Lorin Picket M.D.   On: 11/24/2021 15:19      No flowsheet data found.  No results found for: NITRICOXIDE      Assessment & Plan:   COPD, group D, by GOLD 2017 classification ([place]) Recent flare with viral illness +/- pneumonia -clinically improved after abx /steroids  Check CXR on return in 2-3 weeks  Cont on maintenance  Check PFT on return   Plan  Patient Instructions  Mucinex DM Twice daily  As needed  cough/congestion  Fluids and rest.  Tylenol As needed   Continue on Advair and Spiriva .  Continue on Prednisone  10mg  daily.  Albuterol inhaler or neb As needed   Continue on Oxygen 2l/m as needed to keep Oxygen Levels >88-90%.  Continue on BIPAP At bedtime  with Oxygen 4l/m  Got to quit smoking .  Follow up with [name]  in 2-3  weeks with Chest xray and PFT and As needed   Please contact office for sooner follow up if symptoms do not improve or worsen or seek emergency care         Cigarette smoker Smoking cessation discussed   Chronic respiratory failure with hypoxia ([place]) Stable -continue on O2 to maintain O2 sats >88-90  Plan  Patient Instructions  Mucinex DM Twice daily  As needed  cough/congestion  Fluids and rest.  Tylenol As needed   Continue on Advair and Spiriva .  Continue on Prednisone  10mg  daily.  Albuterol inhaler or neb As needed   Continue on Oxygen 2l/m as needed to keep Oxygen Levels >88-90%.  Continue on BIPAP At bedtime  with Oxygen 4l/m  Got to quit smoking .  Follow up with [name]  in 2-3  weeks with Chest xray and PFT and As needed   Please contact office for sooner follow up if symptoms do not improve or worsen  or seek emergency care        Chronic combined systolic and diastolic CHF (congestive heart failure) ([place]) Keep follow up with cards  Cont on current regimen   Obstructive sleep apnea syndrome Continue BiPAP with oxygen at bedtime.  BiPAP download has been requested.  Encouraged a weight loss.  Preop pulmonary/respiratory exam Pulmonary preop risk assessment.  Patient has underlying moderate to severe COPD that is oxygen dependent along with obstructive sleep apnea on nocturnal BiPAP with oxygen.  He has multiple other comorbidities this places him at a high surgical risk from a pulmonary standpoint.  He is not excluded but would complete procedure in a hospital surgical center   Major Pulmonary risks identified in the multifactorial risk analysis are but not limited to a) pneumonia; b) recurrent intubation risk; c) prolonged or recurrent acute respiratory failure needing mechanical ventilation; d) prolonged hospitalization; e) DVT/Pulmonary embolism; f) Acute Pulmonary edema  Recommend 1. Short duration of surgery as much as possible and avoid paralytic if possible 2. Recovery  in step down or ICU with Pulmonary consultation if indicated  3. BIPAP in post op setting if indicated.  4. Aggressive pulmonary toilet with o2, bronchodilatation, and incentive spirometry and early ambulation       Doaa Kendzierski, NP 12/04/2021

## 2021-12-04 NOTE — Assessment & Plan Note (Signed)
Pulmonary preop risk assessment.  Patient has underlying moderate to severe COPD that is oxygen dependent along with obstructive sleep apnea on nocturnal BiPAP with oxygen.  He has multiple other comorbidities this places him at a high surgical risk from a pulmonary standpoint.  He is not excluded but would complete procedure in a hospital surgical center   Major Pulmonary risks identified in the multifactorial risk analysis are but not limited to a) pneumonia; b) recurrent intubation risk; c) prolonged or recurrent acute respiratory failure needing mechanical ventilation; d) prolonged hospitalization; e) DVT/Pulmonary embolism; f) Acute Pulmonary edema  Recommend 1. Short duration of surgery as much as possible and avoid paralytic if possible 2. Recovery in step down or ICU with Pulmonary consultation if indicated  3. BIPAP in post op setting if indicated.  4. Aggressive pulmonary toilet with o2, bronchodilatation, and incentive spirometry and early ambulation

## 2021-12-04 NOTE — Assessment & Plan Note (Signed)
Keep follow up with cards  Cont on current regimen

## 2021-12-04 NOTE — Telephone Encounter (Signed)
He was recently sick requiring abx and steroids  Also on 12/9 called in for more steroids  Will need ov for follow up , or wait till Jan ov for recheck which is the best option, give him time to improve   Please contact office for sooner follow up if symptoms do not improve or worsen or seek emergency care

## 2021-12-04 NOTE — Assessment & Plan Note (Signed)
Continue BiPAP with oxygen at bedtime.  BiPAP download has been requested.  Encouraged a weight loss.

## 2021-12-04 NOTE — Telephone Encounter (Signed)
Fax received from Dr. Nadeen Landau with [facility] for exam under general anesthesia for anal fissure.  Patient needs surgery clearance. Patient was seen on 11/24/21 by Rexene Edison and has an upcoming OV with [name] on 12/26/2021. Office protocol is a risk assessment can be sent to surgeon if patient has been seen in 60 days or less.   Sending to Tammy and [name] for risk assessment

## 2021-12-04 NOTE — Assessment & Plan Note (Signed)
Stable -continue on O2 to maintain O2 sats >88-90  Plan  Patient Instructions  Mucinex DM Twice daily  As needed  cough/congestion  Fluids and rest.  Tylenol As needed   Continue on Advair and Spiriva .  Continue on Prednisone  10mg  daily.  Albuterol inhaler or neb As needed   Continue on Oxygen 2l/m as needed to keep Oxygen Levels >88-90%.  Continue on BIPAP At bedtime  with Oxygen 4l/m  Got to quit smoking .  Follow up with [name]  in 2-3  weeks with Chest xray and PFT and As needed   Please contact office for sooner follow up if symptoms do not improve or worsen or seek emergency care

## 2021-12-04 NOTE — Telephone Encounter (Signed)
Agree - he needs to be seen.

## 2021-12-04 NOTE — Assessment & Plan Note (Signed)
Smoking cessation discussed 

## 2021-12-04 NOTE — Patient Instructions (Addendum)
Mucinex DM Twice daily  As needed  cough/congestion  Fluids and rest.  Tylenol As needed   Continue on Advair and Spiriva .  Continue on Prednisone  10mg  daily.  Albuterol inhaler or neb As needed   Continue on Oxygen 2l/m as needed to keep Oxygen Levels >88-90%.  Continue on BIPAP At bedtime  with Oxygen 4l/m  Got to quit smoking .  Follow up with [name]  in 2-3  weeks with Chest xray and PFT and As needed   Please contact office for sooner follow up if symptoms do not improve or worsen or seek emergency care

## 2021-12-04 NOTE — Assessment & Plan Note (Addendum)
Recent flare with viral illness +/- pneumonia -clinically improved after abx /steroids  Check CXR on return in 2-3 weeks  Cont on maintenance  Check PFT on return   Plan  Patient Instructions  Mucinex DM Twice daily  As needed  cough/congestion  Fluids and rest.  Tylenol As needed   Continue on Advair and Spiriva .  Continue on Prednisone  10mg  daily.  Albuterol inhaler or neb As needed   Continue on Oxygen 2l/m as needed to keep Oxygen Levels >88-90%.  Continue on BIPAP At bedtime  with Oxygen 4l/m  Got to quit smoking .  Follow up with [name]  in 2-3  weeks with Chest xray and PFT and As needed   Please contact office for sooner follow up if symptoms do not improve or worsen or seek emergency care

## 2021-12-05 ENCOUNTER — Telehealth: Payer: Self-pay | Admitting: Adult Health

## 2021-12-05 NOTE — Telephone Encounter (Signed)
Letter printed and placed in Tammy's box for signature. Reviewed O2 settings who stated understanding. Nothing further needed at this time.   Routing to [employer] for f/u

## 2021-12-05 NOTE — Telephone Encounter (Signed)
Patient's OV notes and clearance form have been faxed back to [facility]. Also discussed with [name] about patient being cleared and that patient is seeing him on 12/26/21. Nothing further needed at this time.

## 2021-12-05 NOTE — Telephone Encounter (Signed)
Fine for jury letter

## 2021-12-05 NOTE — Telephone Encounter (Signed)
Pt states he was seen yesterday and they said they'd follow up with him to let him know if he needed to adjust the oxygen on his concentrator. Pt also has jury duty and was told that we needed his juror number- Pt's juror number [phone] Please advise [phone]

## 2021-12-05 NOTE — Telephone Encounter (Signed)
Spoke to patient, who is requesting a letter to be excused from jury duty.  Jury duty number [phone]. He is also requesting clarification on oxygen level with bipap. He is aware that he should be in 4L with Bipap QHS per last OV note.  Tammy Parrett, please advise. Thanks

## 2021-12-08 NOTE — Telephone Encounter (Signed)
Spoke with Estill Bamberg on 12/05/21, she had TP sign the letter already, nothing further needed.

## 2021-12-09 ENCOUNTER — Telehealth: Payer: Self-pay | Admitting: Adult Health

## 2021-12-09 DIAGNOSIS — J449 Chronic obstructive pulmonary disease, unspecified: Secondary | ICD-10-CM

## 2021-12-09 NOTE — Telephone Encounter (Signed)
Order has been placed for new nebulizer machine and sent to [facility].

## 2021-12-09 NOTE — Telephone Encounter (Signed)
Patient would like to use [employer] [facility]. Phone number is [phone]. Fax number is [phone]. Patient phone number is [phone].

## 2021-12-09 NOTE — Telephone Encounter (Signed)
LMTCB  It appears order was placed today. Order needs to be signed. Will make pt aware when he calls back.

## 2021-12-10 NOTE — Telephone Encounter (Signed)
Order has been placed and was cosigned by TP yesterday 12/20. Called and spoke with pt letting him know that this was taken care of and that [facility] should be sending it to [facility] today 12/21. Pt verbalized understanding. Nothing further needed.

## 2021-12-10 NOTE — Progress Notes (Signed)
Electrophysiology Office Note Date: 12/10/2021  ID:  Robert Church, DOB May 10, 1956, MRN [id number]  PCP: Janith Lima, MD Primary Cardiologist: Quay Burow, MD Electrophysiologist: Constance Haw, MD   CC: Routine ICD follow-up  Robert Church is a 65 y.o. male seen today for Robert Meredith Leeds, MD for routine electrophysiology followup.  Since last being seen in our clinic the patient reports doing about the same. He remains SOB with moderate exertion or more. SOB walking from parking lot to door, had to take a break. Uses O2 at night, but no longer with exertion. Has had orthopnea over the past several days. he denies chest pain, palpitations,   nausea, vomiting, dizziness, syncope, edema, weight gain, or early satiety. He has not had ICD shocks.   Device History: St. Jude Dual Chamber PPM implanted 03/2021 for CHB. Failed Upgrade attempt 10/07/2021   Past Medical History:  Diagnosis Date   Adenomatous colon polyp    Allergy    Anal fissure    Asthma    Bronchitis    CHF (congestive heart failure) (HCC)    COPD, group D, by GOLD 2017 classification ([place])    Emphysema lung (HCC)    GERD (gastroesophageal reflux disease)    Hyperlipidemia    Hypertension    NICM (nonischemic cardiomyopathy) (HCC)    OSA (obstructive sleep apnea)    Pneumonia    Requires supplemental oxygen    Sinusitis    SOB (shortness of breath)    Past Surgical History:  Procedure Laterality Date   BIV UPGRADE N/A 10/07/2021   Procedure: BIV PPM UPGRADE;  Surgeon: Constance Haw, MD;  Location: [facility];  Service: Cardiovascular;  Laterality: N/A;   LEFT HEART CATH AND CORONARY ANGIOGRAPHY N/A 04/19/2017   Procedure: Left Heart Cath and Coronary Angiography;  Surgeon: Belva Crome, MD;  Location: [place] [facility];  Service: Cardiovascular;  Laterality: N/A;   PACEMAKER IMPLANT N/A 04/16/2021   Procedure: PACEMAKER IMPLANT;  Surgeon: Constance Haw, MD;  Location: [facility];  Service: Cardiovascular;  Laterality: N/A;   RIGHT/LEFT HEART CATH AND CORONARY ANGIOGRAPHY N/A 08/22/2021   Procedure: RIGHT/LEFT HEART CATH AND CORONARY ANGIOGRAPHY;  Surgeon: Jolaine Artist, MD;  Location: [facility];  Service: Cardiovascular;  Laterality: N/A;   TONSILLECTOMY      Current Outpatient Medications  Medication Sig Dispense Refill   ADVAIR DISKUS 250-50 MCG/ACT AEPB INHALE 1 PUFF INTO THE LUNGS EVERY 12 HOURS (Patient taking differently: Inhale 1 puff into the lungs See admin instructions. Inhale 1 puff every 12 hours when out of the [facility]) 60 each 11   albuterol (PROVENTIL) (2.5 MG/3ML) 0.083% nebulizer solution Take 3 mLs (2.5 mg total) by nebulization every 4 (four) hours as needed for wheezing or shortness of breath. 75 mL 11   albuterol (VENTOLIN HFA) 108 (90 Base) MCG/ACT inhaler INHALE 2 PUFFS INTO THE LUNGS EVERY 6 HOURS AS NEEDED FOR WHEEZING 18 g 2   allopurinol (ZYLOPRIM) 300 MG tablet Take 1 tablet (300 mg total) by mouth as needed. 90 tablet 0   ALPRAZolam (XANAX) 0.25 MG tablet Take 1 tablet (0.25 mg total) by mouth 3 (three) times daily. 90 tablet 2   aspirin EC 81 MG tablet Take 81 mg by mouth daily. Swallow whole.     colchicine 0.6 MG tablet Take 1 tablet (0.6 mg total) by mouth 2 (two) times daily. 180 tablet 0   DALIRESP 500  MCG TABS tablet Take 250 mcg by mouth 2 (two) times daily.     EPINEPHrine 0.3 mg/0.3 mL IJ SOAJ injection Inject 0.3 mg into the muscle as needed for anaphylaxis.     fluticasone (FLONASE) 50 MCG/ACT nasal spray Place into both nostrils daily.     furosemide (LASIX) 40 MG tablet Take 1 tablet (40 mg total) by mouth daily. (Patient taking differently: Take 40 mg by mouth daily. Monday Wednesday and Friday) 90 tablet 3   Glucosamine HCl (GLUCOSAMINE PO) Take 1 tablet by mouth daily.     Lidocaine, Anorectal, 5 % CREA Apply 1 application topically 2 (two) times daily.     losartan (COZAAR) 25 MG tablet Take 0.5  tablets (12.5 mg total) by mouth at bedtime. 15 tablet 3   mometasone-formoterol (DULERA) 200-5 MCG/ACT AERO Inhale 2 puffs into the lungs in the morning and at bedtime. 13 g 11   montelukast (SINGULAIR) 10 MG tablet Take 1 tablet (10 mg total) by mouth at bedtime. 30 tablet 4   nicotine (NICODERM CQ - DOSED IN MG/24 HOURS) 14 mg/24hr patch Place 14 mg onto the skin daily.     omeprazole (PRILOSEC) 20 MG capsule TAKE 1 CAPSULE(20 MG) BY MOUTH TWICE DAILY BEFORE A MEAL 180 capsule 1   polyethylene glycol (MIRALAX / GLYCOLAX) 17 g packet Take 17 g by mouth daily as needed. 14 each 0   potassium chloride SA (KLOR-CON M20) 20 MEQ tablet Take 1 tablet (20 mEq total) by mouth daily. (Patient taking differently: Take 20 mEq by mouth every other day.) 90 tablet 3   predniSONE (DELTASONE) 10 MG tablet TAKE 1 TABLET(10 MG) BY MOUTH DAILY WITH BREAKFAST (Patient not taking: Reported on 12/04/2021) 30 tablet 5   predniSONE (DELTASONE) 10 MG tablet Take 4 tablets X 3 days, 3 tabs X 3 days, 2 tabs x 3 days, 1 tab x 3 days 30 tablet 0   spironolactone (ALDACTONE) 25 MG tablet Take 0.5 tablets (12.5 mg total) by mouth at bedtime. 15 tablet 6   Tiotropium Bromide Monohydrate (SPIRIVA RESPIMAT) 2.5 MCG/ACT AERS Inhale 2.5 mcg into the lungs 2 (two) times daily. 4 g 11   No current facility-administered medications for this visit.    Allergies:   Clindamycin, Doxycycline, E-mycin [erythromycin base], Jardiance [empagliflozin], Penicillins, Rosuvastatin, and Levaquin [levofloxacin]   Social History: Social History   Socioeconomic History   Marital status: Single    Spouse name: Not on file   Number of children: 1   Years of education: 12   Highest education level: Not on file  Occupational History   Occupation: disabled  Tobacco Use   Smoking status: Former    Packs/day: 0.25    Years: 46.00    Pack years: 11.50    Types: Cigarettes   Smokeless tobacco: Never   Tobacco comments:    Smoking very  little if he even has one in a week.  Does not even want one.  Vaping Use   Vaping Use: Never used  Substance and Sexual Activity   Alcohol use: Yes    Alcohol/week: 3.0 standard drinks    Types: 3 Cans of beer per week    Comment: 2 beers per day   Drug use: No   Sexual activity: Yes    Partners: Female    Birth control/protection: None  Other Topics Concern   Not on file  Social History Narrative   Right handed   Tea sometimes and coffee (1/2 caffeine)  Social Determinants of Health   Financial Resource Strain: Medium Risk   Difficulty of Paying Living Expenses: Somewhat hard  Food Insecurity: No Food Insecurity   Worried About Charity fundraiser in the Last Year: Never true   Ran Out of Food in the Last Year: Never true  Transportation Needs: No Transportation Needs   Lack of Transportation (Medical): No   Lack of Transportation (Non-Medical): No  Physical Activity: Not on file  Stress: Not on file  Social Connections: Not on file  Intimate Partner Violence: Not on file    Family History: Family History  Problem Relation Age of Onset   Lung cancer Father    High blood pressure Mother    Diabetes Maternal Grandmother    Colon polyps Neg Hx    Esophageal cancer Neg Hx    Pancreatic cancer Neg Hx    Stomach cancer Neg Hx     Review of Systems: All other systems reviewed and are otherwise negative except as noted above.   Physical Exam: There were no vitals filed for this visit.   GEN- The patient is well appearing, alert and oriented x 3 today.   HEENT: normocephalic, atraumatic; sclera clear, conjunctiva pink; hearing intact; oropharynx clear; neck supple, no JVP Lymph- no cervical lymphadenopathy Lungs- Clear to ausculation bilaterally, normal work of breathing.  No wheezes, rales, rhonchi Heart- Regular rate and rhythm, no murmurs, rubs or gallops, PMI not laterally displaced GI- soft, non-tender, non-distended, bowel sounds present, no  hepatosplenomegaly Extremities- no clubbing or cyanosis. No edema; DP/PT/radial pulses 2+ bilaterally MS- no significant deformity or atrophy Skin- warm and dry, no rash or lesion; ICD pocket well healed Psych- euthymic mood, full affect Neuro- strength and sensation are intact  ICD interrogation- reviewed in detail today,  See PACEART report  EKG:  EKG is ordered today. Personal review of EKG ordered today shows V paced rhythm at 80 bpm with QRS 134 ms.  Recent Labs: 12/25/2020: Magnesium 2.3 04/09/2021: Pro B Natriuretic peptide (BNP) 156.0 04/15/2021: B Natriuretic Peptide 345.2 04/16/2021: TSH 1.147 07/23/2021: ALT 23 09/26/2021: BUN 18; Creatinine, Ser 1.05; Hemoglobin 15.4; Platelets 215; Potassium 4.3; Sodium 142   Wt Readings from Last 3 Encounters:  12/04/21 227 lb 12.8 oz (103.3 kg)  12/02/21 233 lb 6.4 oz (105.9 kg)  11/24/21 234 lb (106.1 kg)     Other studies Reviewed: Additional studies/ records that were reviewed today include: Previous EP office notes.   Assessment and Plan:  1.  Chronic systolic dysfunction s/p St. 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 Concern his HF may be quite advanced.   2. ? Atrial fibrillation Previously monitor strips without clear AF Continue to monitor via device. *   3. NICM Echo 04/16/21 LVEF 30-35%  GDMT has been limited by intolerances. BB has been limited by GI side effects. Followed by CHF team.  Current medicines are reviewed at length with the patient today.     Labs/ tests ordered today include:  Orders Placed This Encounter  Procedures   Basic metabolic panel   Pro b natriuretic peptide (BNP)   EKG 12-Lead   Robert Church's heart failure has failed to improve despite titration of guideline directed medication such that he qualifies for the [facility] NEO device. The  following information from the patients medical record supports the medical necessity of this  procedure for my patient, consistent with the FDA on-label indication for BAROSTIM NEO:  ? LVEF of 30-35%  confirmed by Echo on 07/2021   ? NT-proBNP of <1600 pg/ml  = Labs to be drawn today, 12/11/21  ?Symptomatic despite medication management of: diuretic, ACEi/ARB/ARNi, and Aldosterone inhibitor as evidenced by symptoms below.   Failed ARNi with hypotension. Failed multiple BB with fatigue, dizziness, and dry mouth.   ? This patients signs and symptoms of heart failure include "dyspnea with mild to moderate exertion, paroxysmal nocturnal dyspnea, orthopnea, and fatigue   ? NYHA Congestive Heart Failure Classification: III  ? Recent hospitalization for Heart Failure on (not applicable for this patient)   This patient is NOT indicated for cardiac resynchronization therapy due to inability to access his subclavian. Upgrade was attempted 09/2021.   Disposition:   Follow up with EP APP in  2 months. Sooner pending barostim work up.      Jacalyn Lefevre, PA-C  12/10/2021 4:07 PM  [place] [phone] (office) [phone] (fax)

## 2021-12-11 ENCOUNTER — Other Ambulatory Visit: Payer: Self-pay

## 2021-12-11 ENCOUNTER — Ambulatory Visit (INDEPENDENT_AMBULATORY_CARE_PROVIDER_SITE_OTHER): Payer: 59 | Admitting: Student

## 2021-12-11 ENCOUNTER — Encounter: Payer: Self-pay | Admitting: Student

## 2021-12-11 VITALS — BP 134/60 | HR 80 | Ht 70.0 in | Wt 233.6 lb

## 2021-12-11 DIAGNOSIS — G4733 Obstructive sleep apnea (adult) (pediatric): Secondary | ICD-10-CM | POA: Diagnosis not present

## 2021-12-11 DIAGNOSIS — I5022 Chronic systolic (congestive) heart failure: Secondary | ICD-10-CM

## 2021-12-11 DIAGNOSIS — I428 Other cardiomyopathies: Secondary | ICD-10-CM

## 2021-12-11 DIAGNOSIS — I442 Atrioventricular block, complete: Secondary | ICD-10-CM

## 2021-12-11 NOTE — Patient Instructions (Signed)
Medication Instructions:  Your physician recommends that you continue on your current medications as directed. Please refer to the Current Medication list given to you today.  *If you need a refill on your cardiac medications before your next appointment, please call your pharmacy*   Lab Work: TODAY: BMET, BNP  If you have labs (blood work) drawn today and your tests are completely normal, you will receive your results only by: [place] (if you have MyChart) OR A paper copy in the mail If you have any lab test that is abnormal or we need to change your treatment, we will call you to review the results.   Follow-Up: At [facility], you and your health needs are our priority.  As part of our continuing mission to provide you with exceptional heart care, we have created designated Provider Care Teams.  These Care Teams include your primary Cardiologist (physician) and Advanced Practice Providers (APPs -  Physician Assistants and Nurse Practitioners) who all work together to provide you with the care you need, when you need it.   Your next appointment:   As scheduled

## 2021-12-12 LAB — BASIC METABOLIC PANEL
BUN/Creatinine Ratio: 29 — ABNORMAL HIGH (ref 10–24)
BUN: 25 mg/dL (ref 8–27)
CO2: 29 mmol/L (ref 20–29)
Calcium: 9.1 mg/dL (ref 8.6–10.2)
Chloride: 97 mmol/L (ref 96–106)
Creatinine, Ser: 0.87 mg/dL (ref 0.76–1.27)
Glucose: 125 mg/dL — ABNORMAL HIGH (ref 70–99)
Potassium: 5.1 mmol/L (ref 3.5–5.2)
Sodium: 139 mmol/L (ref 134–144)
eGFR: 97 mL/min/{1.73_m2} (ref >59)

## 2021-12-12 LAB — PRO B NATRIURETIC PEPTIDE: NT-Pro BNP: 769 pg/mL — ABNORMAL HIGH (ref 0–210)

## 2021-12-16 ENCOUNTER — Telehealth: Payer: Self-pay | Admitting: Emergency Medicine

## 2021-12-16 NOTE — Telephone Encounter (Signed)
Called patient but he did not answer. Left message for him to call us back.  

## 2021-12-17 ENCOUNTER — Encounter: Payer: Self-pay | Admitting: Emergency Medicine

## 2021-12-17 ENCOUNTER — Ambulatory Visit (INDEPENDENT_AMBULATORY_CARE_PROVIDER_SITE_OTHER): Payer: 59 | Admitting: Emergency Medicine

## 2021-12-17 ENCOUNTER — Other Ambulatory Visit: Payer: Self-pay

## 2021-12-17 DIAGNOSIS — J449 Chronic obstructive pulmonary disease, unspecified: Secondary | ICD-10-CM

## 2021-12-17 DIAGNOSIS — G4733 Obstructive sleep apnea (adult) (pediatric): Secondary | ICD-10-CM

## 2021-12-17 MED ORDER — NICOTINE 14 MG/24HR TD PT24: 14.0000 mg | MEDICATED_PATCH | Freq: Every day | TRANSDERMAL | 0 refills | Status: DC

## 2021-12-17 MED ORDER — GUAIFENESIN ER 600 MG PO TB12: 600.0000 mg | ORAL_TABLET | Freq: Two times a day (BID) | ORAL | 5 refills | Status: DC

## 2021-12-17 MED ORDER — IPRATROPIUM BROMIDE 0.06 % NA SOLN
2.0000 | Freq: Four times a day (QID) | NASAL | 3 refills | Status: DC
Start: 2021-12-17 — End: 2022-10-29

## 2021-12-17 NOTE — Telephone Encounter (Signed)
Called and spoke with patient who states that in the morning when he wakes up after using his cpap he feels very congested. States that he feels completely full of fluid every morning and is wondering if its from his CPAP. States that it goes away throughout the day. States that he is using the humidifier with it but doesn't seem to help. I have scheduled the patient to come in today at 3:45 unless you have recommendations for him.   [name] please advise

## 2021-12-17 NOTE — Assessment & Plan Note (Signed)
Please continue Spiriva Respimat 2 inhalations once daily. Continue Dulera 2 puffs twice a day.  Rinse and gargle after using. Keep your albuterol available to use either 2 puffs or 1 nebulizer treatment up to every 4 hours if needed for shortness of breath, chest tightness, wheezing. Continue Daliresp as you have been taking it Continue Singulair as you have been taking it. Continue prednisone 10 mg once daily. Try starting guaifenesin 600 mg twice a day.  We will send a prescription for this. We will remove levofloxacin from your allergy list since you do believe that you did not actually have a reaction to this medication. Follow with Dr Lamonte Sakai in 6 months or sooner if you have any problems

## 2021-12-17 NOTE — Telephone Encounter (Signed)
He could try Atrovent NS at bedtime - this might decrease the mucous that builds up overnight.  If he still wants to be seen today then that is fine

## 2021-12-17 NOTE — Assessment & Plan Note (Signed)
OSA with chronic respiratory failure.  On a trilogy device and benefiting.  Good compliance establish by his download today.  Plan to continue same

## 2021-12-17 NOTE — Patient Instructions (Addendum)
Please continue Spiriva Respimat 2 inhalations once daily. Continue Dulera 2 puffs twice a day.  Rinse and gargle after using. Keep your albuterol available to use either 2 puffs or 1 nebulizer treatment up to every 4 hours if needed for shortness of breath, chest tightness, wheezing. Continue Daliresp as you have been taking it Continue Singulair as you have been taking it. Continue prednisone 10 mg once daily. Try starting guaifenesin 600 mg twice a day.  We will send a prescription for this. Congratulations on stopping smoking.  We will write a prescription for nicotine patches 14 mg. Please continue to use your trilogy BiPAP every night with oxygen bled in. We will remove levofloxacin from your allergy list since you do believe that you did not actually have a reaction to this medication. Follow with Dr Lamonte Sakai in 6 months or sooner if you have any problems

## 2021-12-17 NOTE — Telephone Encounter (Signed)
Called and spoke with patient to give him the recs from [name]. He said he was willing to try it but that he is still going to come into the office at 3:45 for his appt. RX has been sent in. Nothing further needed at this time.

## 2021-12-17 NOTE — Telephone Encounter (Signed)
Patient returning a call. °

## 2021-12-17 NOTE — Progress Notes (Addendum)
Subjective:  Patient ID: GUIDO COMP, male    DOB: 1956-11-20  Age: 65 y.o. MRN: [id number]  CC:  Chief Complaint  Patient presents with   Acute Visit    Pt states he has been having problems each night with congestion, coughing, and also increased SOB. Pt wonders if this could be due to his CPAP.    HPI  ROV 08/20/21 --65 year old man, active smoker with severe COPD and chronic bronchitis.  I have been managing him on prednisone 10 mg daily, Daliresp, Singulair, Mucinex.  Last visit in February he was on Spiriva and we restarted Dulera.  He has albuterol which he is using approximately.  He was treated with Levaquin in June, prednisone in July, Levaquin again and August. He did get benefit - but it looks like he is having rash from the levaquin He is on BiPAP / Trilogy nightly, compliance is good, although his sleep quality is poor. 13/7, average VT > 600cc He is using oxygen only prn, does follow his SpO2  He is scheduled for R and L cardiac cath this week. 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  He is back to smoking, about 0.5 pk/day.  He had COVID this Spring. He does not believe that he has recovered to previous baseline   ROV 12/17/21 --65 year old man with severe COPD and chronic bronchitic phenotype, chronic rhinitis.  He has chronic hypoxemic respiratory failure PMH also significant for CAD and ischemic cardiomyopathy, third-degree heart block with an ICD pacemaker, hypertension. He still smokes occasionally, but has not since 1 weeks ago.  He is on Trilogy +4 L/min, has used it 93% of the nights for the last month Bronchodilator regimen is Spiriva, Dulera. He uses albuterol nebs 3-4x a day.  He is on prednisone 10, Daliresp, Singulair.  He states that he has determined that he did not have a reaction to levaquin, he was on spironolactone at the same time.   He is having a lot of clear mucous, clears it in the am  Wants nicotine 14mg   patches  ROS Review of Systems  Constitutional:  Negative for activity change, fatigue, fever and unexpected weight change.  HENT:  Negative for congestion, postnasal drip, rhinorrhea and sinus pain.   Eyes:  Negative for redness.  Respiratory:  Positive for cough, shortness of breath and wheezing. Negative for apnea, choking, chest tightness and stridor.   Cardiovascular:  Negative for chest pain, palpitations and leg swelling.  Gastrointestinal:  Negative for abdominal distention, abdominal pain, diarrhea and nausea.  Endocrine: Negative for polyuria.  Genitourinary:  Negative for dysuria and hematuria.  Musculoskeletal:  Negative for back pain, gait problem and myalgias.  Skin:  Negative for rash and wound.  Neurological:  Negative for dizziness, seizures, weakness and headaches.  Psychiatric/Behavioral:  Negative for agitation and self-injury.     Objective:   Today's Vitals: BP 126/70 (BP Location: Right Arm, Patient Position: Sitting, Cuff Size: Normal)    Pulse 96    Temp 98 F (36.7 C) (Oral)    Ht 5\' 10"  (1.778 m)    Wt 227 lb 9.6 oz (103.2 kg)    SpO2 95% Comment: RA   BMI 32.66 kg/m   Gen: Pleasant, well-nourished, in no distress,  normal affect  ENT: No lesions,  mouth clear,  oropharynx clear, no postnasal drip  Neck: No JVD, no stridor  Lungs: No use of accessory muscles, distant, no crackles, B exp wheezes.   Cardiovascular: RRR, heart sounds  normal, no murmur or gallops, no peripheral edema  Musculoskeletal: No deformities, no cyanosis or clubbing  Neuro: alert, awake, non focal  Skin: Warm, no lesions or rash   Assessment & Plan:  Obstructive sleep apnea syndrome OSA with chronic respiratory failure.  On a trilogy device and benefiting.  Good compliance establish by his download today.  Plan to continue same  COPD, group D, by GOLD 2017 classification ([place]) Please continue Spiriva Respimat 2 inhalations once daily. Continue Dulera 2 puffs twice a day.   Rinse and gargle after using. Keep your albuterol available to use either 2 puffs or 1 nebulizer treatment up to every 4 hours if needed for shortness of breath, chest tightness, wheezing. Continue Daliresp as you have been taking it Continue Singulair as you have been taking it. Continue prednisone 10 mg once daily. Try starting guaifenesin 600 mg twice a day.  We will send a prescription for this. We will remove levofloxacin from your allergy list since you do believe that you did not actually have a reaction to this medication. Follow with Dr Lamonte Sakai in 6 months or sooner if you have any problems    Baltazar Apo, MD, PhD 12/17/2021, 4:23 PM [facility] [phone] or if no answer [phone]   Addendum: Surgical Risk Assessment:  Patient being evaluated for vascular surgery.  Based on his severity of COPD, chronic symptoms, prednisone use he is high risk for general anesthesia or surgical procedure.  This risk includes prolonged mechanical ventilation, prolonged hospitalization, even increased mortality.  That said there is no absolute contraindication to proceeding with surgery if the benefits outweigh these risks.  Certainly patient needs to be in a stable state without any evidence of an acute exacerbation including fever, increased dyspnea, increased wheezing, increased sputum production or purulent sputum.  If any of the symptoms are present then procedure would need to be deferred.   Baltazar Apo, MD, PhD 01/14/2022, 9:09 AM [facility] [phone] or if no answer before 7:00PM call [phone] For any issues after 7:00PM please call eLink [phone]

## 2021-12-18 ENCOUNTER — Telehealth: Payer: Self-pay | Admitting: Internal Medicine

## 2021-12-18 ENCOUNTER — Ambulatory Visit (HOSPITAL_COMMUNITY)
Admission: RE | Admit: 2021-12-18 | Discharge: 2021-12-18 | Disposition: A | Discharge status: Home / Self Care | Payer: 59 | Source: Ambulatory Visit | Attending: Student | Admitting: Student

## 2021-12-18 ENCOUNTER — Telehealth: Payer: Self-pay | Admitting: Cardiology

## 2021-12-18 ENCOUNTER — Other Ambulatory Visit: Payer: Self-pay | Admitting: Internal Medicine

## 2021-12-18 ENCOUNTER — Other Ambulatory Visit: Payer: Self-pay | Admitting: Student

## 2021-12-18 DIAGNOSIS — Z01818 Encounter for other preprocedural examination: Secondary | ICD-10-CM

## 2021-12-18 DIAGNOSIS — I5022 Chronic systolic (congestive) heart failure: Secondary | ICD-10-CM

## 2021-12-18 DIAGNOSIS — K2100 Gastro-esophageal reflux disease with esophagitis, without bleeding: Secondary | ICD-10-CM

## 2021-12-18 MED ORDER — DEXLANSOPRAZOLE 60 MG PO CPDR: 60.0000 mg | DELAYED_RELEASE_CAPSULE | Freq: Every day | ORAL | 1 refills | Status: DC

## 2021-12-18 NOTE — Telephone Encounter (Signed)
Patient states omeprazole (PRILOSEC) 20 MG capsule is no longer working  Patient states he thinks his body has become immune to the medication  Patient is requesting either a different medication, higher dosage of current medication, or otc recommendations  Please advise

## 2021-12-18 NOTE — Telephone Encounter (Signed)
Patient called and had a question about a heart procedure about 12 to 14 weeks ago. Have not felt right since then. Would like to talk with Dr. Curt Bears or nurse. Want to know if they need to do another ultrasound.

## 2021-12-18 NOTE — Telephone Encounter (Signed)
Patient complaining of chest pain left side. He stated it is tight in the morning and then ease off and then returns in the afternoon then ease off again, that has been going on since they attempted to do the BIV PPM UPGRADE. Patient complaining of having more SOB and no energy since. Will send message to Oda Kilts PA for advisement since Dr. Gwenlyn Found primary cardiologist and Dr. Curt Bears EP cardiologist is out. Will also see if Dr. Haroldine Laws has any advisement for patient, patient sees him at CHF clinic.

## 2021-12-18 NOTE — Telephone Encounter (Signed)
No complaint of exertional type chest pain at visit 12/22.    Re: CP please give alarm symptoms such as severe SOB, syncope, worse with activity especially if lasting > 15 minutes as ER prompts for evaluation if feels this way or worsens.   If stable and not meeting this criteria can be addressed further at visit 1/3.       Re: SOB we discussed sliding scale diuretics at our visit and our actively working to get him considered for Barostim procedure for his CHF.  Also seen by pulm 12/28 and re-educated on appropriate use of his inhalers and O2.     Called patient back with advisement from Oda Kilts PA as written above. Patient verbalized understanding. Patient stated he has nitroglycerin, so he will use nitroglycerin if needed.

## 2021-12-18 NOTE — Telephone Encounter (Signed)
Pt has been informed and expressed understanding.  

## 2021-12-19 ENCOUNTER — Other Ambulatory Visit: Payer: Self-pay

## 2021-12-19 DIAGNOSIS — I5022 Chronic systolic (congestive) heart failure: Secondary | ICD-10-CM

## 2021-12-22 NOTE — Progress Notes (Signed)
ADVANCED HF CLINIC NOTE  MD: Dr. Haroldine Laws  Pulmonology: Dr Lamonte Sakai   Reason for Visit:  F/u for Chronic Systolic Heart Failure   HPI: Mr. Meskill is a 66 y/o male with h/o obesity, HTN, COPD with ongoing tobacco use and chronic systolic heart failure.   He underwent cath in 4/18 due to CP. Cath showed normal coronaries with EF 40% and global HK. LVEDP 19.   He saw Dr. Gwenlyn Found on 07/01/2018 because of recurrent chest pain.  Myoview stress test performed 07/21/2018 showed EF 38% inferior scar with septal ischemia.  A 2D echo performed 07/07/2018 revealed an EF of 30 to 35%.  He was referred for cardiac CT.   Cardiac CT on 9/19.  Calcium score 148 (74th percentile)  LAD 25% otherwise normal cors. Dilated pulmonary arteries suggestive of PH.   Worked for [employer]. Retired 5/21. Follows with [name] in Pulmonary Clinic. Was started on bisoprolol 2.5 but couldn't tolerate due to fatigue. Was falling asleep at work. Stopped bisoprolol and felt better.   Echo 12/20 EF improved to 45%. Mild RV dysfunction   Admitted 1/22 for acute on chronic hypoxic respiratory failure secondary to acute COPD and CHF w/ volume overload. He had massive diuresis w/ IV Lasix, diuresed down from admit wt of 265 lb to 210 lb. Echo was repeated 12/21, EF 40-45% (unchanged). RV mildly reduced, RVSP 49.   Followed in clinic, Jardiance and Arlyce Harman added - dose adjusted due to intolerances. He then stopped these on his own.   In 4/22, had near syncope, Zio showed intermittent CHB. Repeat echo EF 30-35%% Had St Jude dual chamber PM 04/16/21. Had some PMT and device adjusted by EP .   Had ECHO 07/31/21 with EF 30-35%,  moderately reduced RV, and diffuse HK worse in inferior base.  Underwent R/L cath in 9/22 for persistent SOB. No CAD. EF 35% Mildly elevated filling pressures with normal output.  Ao =  128/75 (95) LV =  130/24 RA =  10 RV =  40/12 PA = 45/19 (32) PCW = 18  Fick cardiac output/index = 5.1/2.3 PVR  = 2.6 WU FA sat = 94% PA sat = 66%, 66%  Subsequently underwent CRT-D upgrade attempt on 10/07/21 by Dr. Curt Bears due to high-degree of RV pacing. 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   Last seen for follow-up 10/20/21.  Noted increased dyspnea. Volume up with > 10 lb weight gain. Lasix increased to 40 mg daily X 1 week then decreased back to 40 mg M, W, F.   Recently treated for COPD exacerbation 12/22 in setting of viral illness +/- pneumonia. Treated with abx and steroids.  Following with Pulmonary.   Saw EP for follow-up 12/11/21. Continue to struggle with dyspnea. Volume appeared stable.  He is being considered for Barostim.   He is here today for 2 month follow-up. Complains of increased SOB w/ activity. SOB walking to mail box. Reports full med compliance and has been fluid restricting and limiting salt. BP well controlled. No Afib on device interrogation. ReDs clip 37%. Wt up 5 lb from prior visit.    Cardiac studies: Echo 06/2018 LVEF 30-35%, RV normal  Echo 12/2018 LVEF 40-45%, RV normal  Echo 11/2020 LVEF 40-45%, RV mildly reduced Echo 03/2021 30-35%, RV mildly reduced  Echo 07/2021 EF 30-35% RV moderately reduced   Review of systems complete and found to be negative unless listed in HPI.    Past Medical History:  Diagnosis Date   Adenomatous colon polyp    Allergy    Anal fissure    Asthma    Bronchitis    CHF (congestive heart failure) (HCC)    COPD, group D, by GOLD 2017 classification ([facility])    Emphysema lung (HCC)    GERD (gastroesophageal reflux disease)    Hyperlipidemia    Hypertension    NICM (nonischemic cardiomyopathy) (HCC)    OSA (obstructive sleep apnea)    Pneumonia    Requires supplemental oxygen    Sinusitis    SOB (shortness of breath)     Current Outpatient Medications  Medication Sig Dispense Refill   ADVAIR DISKUS 250-50 MCG/ACT AEPB INHALE 1 PUFF INTO THE LUNGS EVERY 12 HOURS 60 each 11   albuterol (PROVENTIL)  (2.5 MG/3ML) 0.083% nebulizer solution Take 3 mLs (2.5 mg total) by nebulization every 4 (four) hours as needed for wheezing or shortness of breath. 75 mL 11   albuterol (VENTOLIN HFA) 108 (90 Base) MCG/ACT inhaler INHALE 2 PUFFS INTO THE LUNGS EVERY 6 HOURS AS NEEDED FOR WHEEZING 18 g 2   allopurinol (ZYLOPRIM) 300 MG tablet Take 1 tablet (300 mg total) by mouth as needed. 90 tablet 0   ALPRAZolam (XANAX) 0.25 MG tablet Take 1 tablet (0.25 mg total) by mouth 3 (three) times daily. 90 tablet 2   aspirin EC 81 MG tablet Take 81 mg by mouth daily. Swallow whole.     colchicine 0.6 MG tablet Take 1 tablet (0.6 mg total) by mouth 2 (two) times daily. 180 tablet 0   DALIRESP 500 MCG TABS tablet Take 250 mcg by mouth 2 (two) times daily.     dexlansoprazole (DEXILANT) 60 MG capsule Take 1 capsule (60 mg total) by mouth daily. 90 capsule 1   EPINEPHrine 0.3 mg/0.3 mL IJ SOAJ injection Inject 0.3 mg into the muscle as needed for anaphylaxis.     fluticasone (FLONASE) 50 MCG/ACT nasal spray Place into both nostrils daily.     furosemide (LASIX) 40 MG tablet Take 1 tablet (40 mg total) by mouth daily. (Patient taking differently: Take 40 mg by mouth daily. Monday Wednesday and Friday) 90 tablet 3   Glucosamine HCl (GLUCOSAMINE PO) Take 1 tablet by mouth daily.     guaiFENesin (MUCINEX) 600 MG 12 hr tablet Take 1 tablet (600 mg total) by mouth 2 (two) times daily. 60 tablet 5   ipratropium (ATROVENT) 0.06 % nasal spray Place 2 sprays into both nostrils 4 (four) times daily. 15 mL 3   Lidocaine, Anorectal, 5 % CREA Apply 1 application topically 2 (two) times daily.     losartan (COZAAR) 25 MG tablet Take 0.5 tablets (12.5 mg total) by mouth at bedtime. 15 tablet 3   mometasone-formoterol (DULERA) 200-5 MCG/ACT AERO Inhale 2 puffs into the lungs in the morning and at bedtime. 13 g 11   montelukast (SINGULAIR) 10 MG tablet Take 1 tablet (10 mg total) by mouth at bedtime. 30 tablet 4   nicotine (NICODERM CQ -  DOSED IN MG/24 HOURS) 14 mg/24hr patch Place 1 patch (14 mg total) onto the skin daily. 28 patch 0   polyethylene glycol (MIRALAX / GLYCOLAX) 17 g packet Take 17 g by mouth daily as needed. 14 each 0   potassium chloride SA (KLOR-CON M20) 20 MEQ tablet Take 1 tablet (20 mEq total) by mouth daily. (Patient taking differently: Take 20 mEq by mouth every other day.) 90 tablet 3   predniSONE (DELTASONE) 10 MG tablet  TAKE 1 TABLET(10 MG) BY MOUTH DAILY WITH BREAKFAST 30 tablet 5   Tiotropium Bromide Monohydrate (SPIRIVA RESPIMAT) 2.5 MCG/ACT AERS Inhale 2.5 mcg into the lungs 2 (two) times daily. 4 g 11   No current facility-administered medications for this encounter.    Allergies  Allergen Reactions   Clindamycin Other (See Comments)    Tongue swelling   Doxycycline     Severe stomach upset per patient   E-Mycin [Erythromycin Base] Swelling   Jardiance [Empagliflozin] Nausea Only and Other (See Comments)    Lightheadness Dizziness   Penicillins Swelling and Other (See Comments)    Childhood rxn--MD stated he "almost died" Has patient had a PCN reaction causing immediate rash, facial/tongue/throat swelling, SOB or lightheadedness with hypotension:Yes Has patient had a PCN reaction causing severe rash involving mucus membranes or skin necrosis:unsure Has patient had a PCN reaction that required hospitalization:unsure Has patient had a PCN reaction occurring within the last 10 years:No If all of the above answers are "NO", then may proceed with Cephalosporin use.     Rosuvastatin     Myalgias (intolerance)      Social History   Socioeconomic History   Marital status: Single    Spouse name: Not on file   Number of children: 1   Years of education: 12   Highest education level: Not on file  Occupational History   Occupation: disabled  Tobacco Use   Smoking status: Former    Packs/day: 0.25    Years: 46.00    Pack years: 11.50    Types: Cigarettes   Smokeless tobacco: Never    Tobacco comments:    Smoking very little if he even has one in a week.  Does not even want one.  Vaping Use   Vaping Use: Never used  Substance and Sexual Activity   Alcohol use: Yes    Alcohol/week: 3.0 standard drinks    Types: 3 Cans of beer per week    Comment: 2 beers per day   Drug use: No   Sexual activity: Yes    Partners: Female    Birth control/protection: None  Other Topics Concern   Not on file  Social History Narrative   Right handed   Tea sometimes and coffee (1/2 caffeine)   Social Determinants of Health   Financial Resource Strain: Medium Risk   Difficulty of Paying Living Expenses: Somewhat hard  Food Insecurity: No Food Insecurity   Worried About Charity fundraiser in the Last Year: Never true   Ran Out of Food in the Last Year: Never true  Transportation Needs: No Transportation Needs   Lack of Transportation (Medical): No   Lack of Transportation (Non-Medical): No  Physical Activity: Not on file  Stress: Not on file  Social Connections: Not on file  Intimate Partner Violence: Not on file      Family History  Problem Relation Age of Onset   Lung cancer Father    High blood pressure Mother    Diabetes Maternal Grandmother    Colon polyps Neg Hx    Esophageal cancer Neg Hx    Pancreatic cancer Neg Hx    Stomach cancer Neg Hx     Vitals:   12/23/21 1113  BP: 130/78  Pulse: 78  SpO2: 93%  Weight: 105.3 kg (232 lb 3.5 oz)    Wt Readings from Last 3 Encounters:  12/23/21 105.3 kg (232 lb 3.5 oz)  12/17/21 103.2 kg (227 lb 9.6 oz)  12/11/21 106  kg (233 lb 9.6 oz)     PHYSICAL EXAM: ReDs clip 37%  General:  Well appearing. No respiratory difficulty HEENT: normal Neck: supple. Thick neck, JVD not well visualized. Carotids 2+ bilat; no bruits. No lymphadenopathy or thyromegaly appreciated. Cor: PMI nondisplaced. Regular rate & rhythm. No rubs, gallops or murmurs. Lungs: clear Abdomen: obese, soft, nontender, nondistended. No  hepatosplenomegaly. No bruits or masses. Good bowel sounds. Extremities: no cyanosis, clubbing, rash, trace bilateral LE edema Neuro: alert & oriented x 3, cranial nerves grossly intact. moves all 4 extremities w/o difficulty. Affect pleasant.   ASSESSMENT & PLAN:  1. Chronic systolic HF due to NICM  - cath 4/18 normal cors. EF 48%  - Echo 7/19  EF 30-35%. RV normal.  - Cardiac CT 9/19.  Calcium score 148 (74th percentile)  LAD 25% otherwise normal cors. Dilated pulmonary arteries suggestive of PH.  - Suspect NICM due to HTN and inadequately treated OSA (He now reports improved nightly compliance w/ CPAP). - Echo 1/22 EF 40-45%. RV mildly reduced. RVSP 49 - Admit 4/22 for symptomatic bradycardia/CHF. Echo LVEF 30%. RV mildly reduced - Echo 07/31/21 EF 30-35 % and RV moderately reduced.  - R/LHC 9/22 No CAD EF 35%, Mildly elevated filling pressures with normal output - Failed upgrade to CRT-D due to 99% RV pacing. However RV pacing significantly decreased  - Chronically NYHA Class III, COPD and obesity/ deconditioning  - EP working on [employer] - Volume status elevated on exam, ReDs Clip 37 - Increase Lasix to 40 mg daily (currently taking MWF)  - No Arlyce Harman (H/o Anaphylaxis)  - Intolerant to Jardiance positional dizziness and nausea.  - Continue Losartan 12.5 mg daily (failed Entresto due to dizziness/low BP) - Intolerant bisoprolol due to fatigue, coreg due to dizziness, and toprol due to dry mouth w/ Toprol.  - Given, CHB, biventricular dysfunction, moderate LVH and inability to tolerate HF meds due to orthostasis, there is concern about infiltrative CM (sarcoid, amyloid). cMRI considered but deferred at patient request.  (device MRI compatible per EP but shadowing may make scan less diagnostic)  - Multiple myeloma panel negative. He does not want to do PYP.  - Remain symptomatic out of proportion to EF, hemodynamics and volume status. Suspect limitation is multfactorial.   2. CHB: s/p PPM  4/22 - followed by Dr. Curt Bears  - Failed CRT-D upgrade 10/22 due to subclavian stenosis  3. HTN - controlled on current regimen    4. COPD  - GOLD Group D  -  Follows with Pulmonary ([name]). Has visit this week, 1/6  -  home O2 PRN -  no longer smoking  -  continue BiPAP   5. OSA - Continue nightly Bipap  6. Morbid obesity  - I suspect this is a big part of his functional limitation/DOE  7. Pulmonary Nodules - followed by [name] - CT scan 3/22 stable, annual screening advised.   F/u w/ APP in 2-3 weeks to reassess volume status. If poor response to daily Lasix, consider switch to torsemide   Lyda Jester, PA-C  11:17 AM

## 2021-12-23 ENCOUNTER — Encounter (HOSPITAL_COMMUNITY): Payer: Self-pay

## 2021-12-23 ENCOUNTER — Ambulatory Visit (HOSPITAL_COMMUNITY)
Admission: RE | Admit: 2021-12-23 | Discharge: 2021-12-23 | Disposition: A | Discharge status: Home / Self Care | Payer: 59 | Source: Ambulatory Visit | Attending: Internal Medicine | Admitting: Internal Medicine

## 2021-12-23 ENCOUNTER — Other Ambulatory Visit: Payer: Self-pay

## 2021-12-23 VITALS — BP 130/78 | HR 78 | Wt 232.2 lb

## 2021-12-23 DIAGNOSIS — M1009 Idiopathic gout, multiple sites: Secondary | ICD-10-CM

## 2021-12-23 DIAGNOSIS — K429 Umbilical hernia without obstruction or gangrene: Secondary | ICD-10-CM

## 2021-12-23 DIAGNOSIS — R6889 Other general symptoms and signs: Secondary | ICD-10-CM

## 2021-12-23 DIAGNOSIS — E291 Testicular hypofunction: Secondary | ICD-10-CM

## 2021-12-23 DIAGNOSIS — E118 Type 2 diabetes mellitus with unspecified complications: Secondary | ICD-10-CM

## 2021-12-23 DIAGNOSIS — Z0001 Encounter for general adult medical examination with abnormal findings: Secondary | ICD-10-CM

## 2021-12-23 DIAGNOSIS — Z20828 Contact with and (suspected) exposure to other viral communicable diseases: Secondary | ICD-10-CM

## 2021-12-23 DIAGNOSIS — I442 Atrioventricular block, complete: Secondary | ICD-10-CM | POA: Diagnosis not present

## 2021-12-23 DIAGNOSIS — J449 Chronic obstructive pulmonary disease, unspecified: Secondary | ICD-10-CM | POA: Insufficient documentation

## 2021-12-23 DIAGNOSIS — J45909 Unspecified asthma, uncomplicated: Secondary | ICD-10-CM

## 2021-12-23 DIAGNOSIS — E781 Pure hyperglyceridemia: Secondary | ICD-10-CM

## 2021-12-23 DIAGNOSIS — F411 Generalized anxiety disorder: Secondary | ICD-10-CM

## 2021-12-23 DIAGNOSIS — M545 Low back pain, unspecified: Secondary | ICD-10-CM

## 2021-12-23 DIAGNOSIS — G4733 Obstructive sleep apnea (adult) (pediatric): Secondary | ICD-10-CM | POA: Diagnosis not present

## 2021-12-23 DIAGNOSIS — Z91030 Bee allergy status: Secondary | ICD-10-CM

## 2021-12-23 DIAGNOSIS — Z79899 Other long term (current) drug therapy: Secondary | ICD-10-CM | POA: Insufficient documentation

## 2021-12-23 DIAGNOSIS — I5022 Chronic systolic (congestive) heart failure: Secondary | ICD-10-CM | POA: Insufficient documentation

## 2021-12-23 DIAGNOSIS — Z77090 Contact with and (suspected) exposure to asbestos: Secondary | ICD-10-CM

## 2021-12-23 DIAGNOSIS — I110 Hypertensive heart disease with heart failure: Secondary | ICD-10-CM | POA: Insufficient documentation

## 2021-12-23 DIAGNOSIS — M5030 Other cervical disc degeneration, unspecified cervical region: Secondary | ICD-10-CM

## 2021-12-23 DIAGNOSIS — Z950 Presence of cardiac pacemaker: Secondary | ICD-10-CM | POA: Diagnosis not present

## 2021-12-23 DIAGNOSIS — Z01811 Encounter for preprocedural respiratory examination: Secondary | ICD-10-CM

## 2021-12-23 DIAGNOSIS — I5042 Chronic combined systolic (congestive) and diastolic (congestive) heart failure: Secondary | ICD-10-CM

## 2021-12-23 DIAGNOSIS — J431 Panlobular emphysema: Secondary | ICD-10-CM

## 2021-12-23 DIAGNOSIS — M170 Bilateral primary osteoarthritis of knee: Secondary | ICD-10-CM

## 2021-12-23 DIAGNOSIS — I700 Atherosclerosis of aorta: Secondary | ICD-10-CM

## 2021-12-23 DIAGNOSIS — M4802 Spinal stenosis, cervical region: Secondary | ICD-10-CM

## 2021-12-23 DIAGNOSIS — Z87891 Personal history of nicotine dependence: Secondary | ICD-10-CM | POA: Insufficient documentation

## 2021-12-23 DIAGNOSIS — I428 Other cardiomyopathies: Secondary | ICD-10-CM | POA: Insufficient documentation

## 2021-12-23 DIAGNOSIS — Z6838 Body mass index (BMI) 38.0-38.9, adult: Secondary | ICD-10-CM

## 2021-12-23 DIAGNOSIS — J9611 Chronic respiratory failure with hypoxia: Secondary | ICD-10-CM

## 2021-12-23 DIAGNOSIS — M47816 Spondylosis without myelopathy or radiculopathy, lumbar region: Secondary | ICD-10-CM

## 2021-12-23 DIAGNOSIS — G8929 Other chronic pain: Secondary | ICD-10-CM

## 2021-12-23 DIAGNOSIS — E785 Hyperlipidemia, unspecified: Secondary | ICD-10-CM

## 2021-12-23 DIAGNOSIS — K601 Chronic anal fissure: Secondary | ICD-10-CM

## 2021-12-23 DIAGNOSIS — F17210 Nicotine dependence, cigarettes, uncomplicated: Secondary | ICD-10-CM

## 2021-12-23 DIAGNOSIS — R911 Solitary pulmonary nodule: Secondary | ICD-10-CM

## 2021-12-23 DIAGNOSIS — Z86010 Personal history of colonic polyps: Secondary | ICD-10-CM

## 2021-12-23 DIAGNOSIS — Z6832 Body mass index (BMI) 32.0-32.9, adult: Secondary | ICD-10-CM

## 2021-12-23 DIAGNOSIS — Z0000 Encounter for general adult medical examination without abnormal findings: Secondary | ICD-10-CM

## 2021-12-23 LAB — BASIC METABOLIC PANEL
Anion gap: 7 (ref 5–15)
BUN: 16 mg/dL (ref 8–23)
CO2: 26 mmol/L (ref 22–32)
Calcium: 8.9 mg/dL (ref 8.9–10.3)
Chloride: 105 mmol/L (ref 98–111)
Creatinine, Ser: 0.98 mg/dL (ref 0.61–1.24)
GFR, Estimated: >60 mL/min (ref >60)
Glucose, Bld: 151 mg/dL — ABNORMAL HIGH (ref 70–99)
Potassium: 4.2 mmol/L (ref 3.5–5.1)
Sodium: 138 mmol/L (ref 135–145)

## 2021-12-23 LAB — BRAIN NATRIURETIC PEPTIDE: B Natriuretic Peptide: 239.4 pg/mL — ABNORMAL HIGH (ref 0.0–100.0)

## 2021-12-23 MED ORDER — FUROSEMIDE 40 MG PO TABS: 40.0000 mg | ORAL_TABLET | Freq: Every day | ORAL | 3 refills | Status: DC

## 2021-12-23 NOTE — Patient Instructions (Signed)
Labs done today. We will contact you only if your labs are abnormal.  INCREASE Lasix to 40mg  (1 tablet) by mouth daily.   No other medication changes were made. Please continue all current medications as prescribed.  Your physician recommends that you schedule a follow-up appointment in: 2-3 weeks with our NP/PA Clinic here in our office  If you have any questions or concerns before your next appointment please send [place] a message through [place] or call our office at [phone].    TO LEAVE A MESSAGE FOR THE NURSE SELECT OPTION 2, PLEASE LEAVE A MESSAGE INCLUDING: YOUR NAME DATE OF BIRTH CALL BACK NUMBER REASON FOR CALL**this is important as we prioritize the call backs  YOU WILL RECEIVE A CALL BACK THE SAME DAY AS LONG AS YOU CALL BEFORE 4:00 PM   Do the following things EVERYDAY: Weigh yourself in the morning before breakfast. Write it down and keep it in a log. Take your medicines as prescribed Eat low salt foods--Limit salt (sodium) to 2000 mg per day.  Stay as active as you can everyday Limit all fluids for the day to less than 2 liters   At the [facility], you and your health needs are our priority. As part of our continuing mission to provide you with exceptional heart care, we have created designated Provider Care Teams. These Care Teams include your primary Cardiologist (physician) and Advanced Practice Providers (APPs- Physician Assistants and Nurse Practitioners) who all work together to provide you with the care you need, when you need it.   You may see any of the following providers on your designated Care Team at your next follow up: Dr Glori Bickers Dr Haynes Kerns, NP Lyda Jester, [place] Audry Riles, PharmD   Please be sure to bring in all your medications bottles to every appointment.

## 2021-12-23 NOTE — Progress Notes (Signed)
ReDS Vest / Clip - 12/23/21 1100       ReDS Vest / Clip   Station Marker D    Ruler Value 35    ReDS Value Range Moderate volume overload    ReDS Actual Value 37

## 2021-12-24 ENCOUNTER — Other Ambulatory Visit: Payer: Self-pay | Admitting: Internal Medicine

## 2021-12-24 DIAGNOSIS — K2100 Gastro-esophageal reflux disease with esophagitis, without bleeding: Secondary | ICD-10-CM

## 2021-12-24 DIAGNOSIS — F411 Generalized anxiety disorder: Secondary | ICD-10-CM

## 2021-12-25 ENCOUNTER — Telehealth: Payer: Self-pay

## 2021-12-25 ENCOUNTER — Other Ambulatory Visit: Payer: Self-pay | Admitting: Internal Medicine

## 2021-12-25 DIAGNOSIS — K2100 Gastro-esophageal reflux disease with esophagitis, without bleeding: Secondary | ICD-10-CM

## 2021-12-25 DIAGNOSIS — F411 Generalized anxiety disorder: Secondary | ICD-10-CM

## 2021-12-25 MED ORDER — ALPRAZOLAM 0.25 MG PO TABS: 0.2500 mg | ORAL_TABLET | Freq: Three times a day (TID) | ORAL | 2 refills | Status: DC

## 2021-12-25 MED ORDER — OMEPRAZOLE 20 MG PO CPDR: 20.0000 mg | DELAYED_RELEASE_CAPSULE | Freq: Two times a day (BID) | ORAL | 1 refills | Status: DC

## 2021-12-25 NOTE — Telephone Encounter (Signed)
ERROR

## 2021-12-25 NOTE — Telephone Encounter (Signed)
Pt calling in stating that he was prescribed dexlansoprazole (DEXILANT) 60 MG capsule for GERD but its causes him to have an upset stomach such as Diarrhea.   Pt is requesting to take Omeprazole again for GERD.   Pt contact number [address]  LOV 10/20/21

## 2021-12-26 ENCOUNTER — Telehealth: Payer: Self-pay | Admitting: Emergency Medicine

## 2021-12-26 ENCOUNTER — Ambulatory Visit: Payer: Medicaid Other | Admitting: Emergency Medicine

## 2021-12-26 MED ORDER — DALIRESP 500 MCG PO TABS: 250.0000 ug | ORAL_TABLET | Freq: Two times a day (BID) | ORAL | 5 refills | Status: DC

## 2021-12-26 NOTE — Telephone Encounter (Signed)
I called the patient and he reports that he is feeling dizzy, headaches and some diarrhea since being on the generic of the Dalirisp for 3 day. Patient reports that he is not able to take the generic and wants to know if he can go back on brand name due to the side effects of being on the generic. He reports he has stopped taking the generic completely. Please advise if ok to place order for brand Daliresp.

## 2021-12-26 NOTE — Telephone Encounter (Signed)
I called the patient and let him know that [name] is agreeable to the change and I have sent a new prescription to the pharmacy and the patient reports that he would bring his updated card by the office to get it updated. Nothing further needed.

## 2021-12-26 NOTE — Telephone Encounter (Signed)
Yes that's ok w me

## 2021-12-27 ENCOUNTER — Other Ambulatory Visit: Payer: Self-pay | Admitting: Emergency Medicine

## 2021-12-29 ENCOUNTER — Telehealth: Payer: Self-pay | Admitting: Student

## 2021-12-29 ENCOUNTER — Telehealth: Payer: Self-pay | Admitting: Emergency Medicine

## 2021-12-29 MED ORDER — SPIRIVA RESPIMAT 2.5 MCG/ACT IN AERS: 2.5000 ug | INHALATION_SPRAY | Freq: Two times a day (BID) | RESPIRATORY_TRACT | 11 refills | Status: DC

## 2021-12-29 NOTE — Telephone Encounter (Signed)
Pt is f/u on results of Carotid test

## 2021-12-29 NOTE — Telephone Encounter (Signed)
I called and let the patient know that I have sent  his Spiriva has been sent into the pharmacy and he did not have any other questions. Nothing further needed.

## 2021-12-29 NOTE — Telephone Encounter (Signed)
Called reviewed Oda Kilts, PA explanation of carotid US results.  Pt verbalizes understanding.

## 2021-12-30 ENCOUNTER — Telehealth: Payer: Self-pay | Admitting: Internal Medicine

## 2021-12-30 NOTE — Telephone Encounter (Signed)
Appt sch with APP 01/08/2022

## 2021-12-30 NOTE — Telephone Encounter (Signed)
Inbound call from patien. States he need medication refill for lidocaine and nutroglyserin sent to [facility]

## 2021-12-30 NOTE — Telephone Encounter (Signed)
Patient needs appt

## 2021-12-31 ENCOUNTER — Telehealth: Payer: Self-pay | Admitting: Emergency Medicine

## 2021-12-31 NOTE — Telephone Encounter (Signed)
Dr Lamonte Sakai can you please advise:   Pt wants to know if he can switch to advair HFA. Please advise.

## 2022-01-01 ENCOUNTER — Telehealth (HOSPITAL_COMMUNITY): Payer: Self-pay

## 2022-01-01 ENCOUNTER — Telehealth: Payer: Self-pay

## 2022-01-01 MED ORDER — TORSEMIDE 20 MG PO TABS: 20.0000 mg | ORAL_TABLET | Freq: Every day | ORAL | 3 refills | Status: DC

## 2022-01-01 MED ORDER — ADVAIR HFA 115-21 MCG/ACT IN AERO: 2.0000 | INHALATION_SPRAY | Freq: Two times a day (BID) | RESPIRATORY_TRACT | 3 refills | Status: DC

## 2022-01-01 NOTE — Telephone Encounter (Signed)
Yes this is ok. Advair 115/90mcg, 2 puffs bid.

## 2022-01-01 NOTE — Telephone Encounter (Signed)
Can switch Furosemide to Torsemide 20 mg daily. Will need BMET same day as f/u next week

## 2022-01-01 NOTE — Telephone Encounter (Signed)
Transmission received. Normal device function noted. Patient states he has not felt well since aborted Bi-V upgrade secondary to inability to pass LV lead. Patient has very little energy to perform ADLs and IADLs including exertional activities such as running errands. States he easily gains 3 lbs overnight however is able to loose it with furosemide. Weight yesterday 227.6 lb., today 230.2 lb. He denies additional swelling in hands, feet, and abdomen however he has increased shortness of breath with exertion. Sleeping on 2 pillows which is unchanged from patient norm. He has called HF clinic this am to discuss fluid retention based on weight gain. Patient has ROV follow up in HF clinic 01/06/22. Patient c/o congestion and cough with CPAP and inability to sleep for more that 1 hour at a time. ED precautions given. Stressed importance of low NA diet and medication compliance. HF zones reviewed. Will route to Dr. Haroldine Laws as Juluis Rainier and advisement.

## 2022-01-01 NOTE — Telephone Encounter (Signed)
The patient states he has not felt well since they tried to put his pacemaker on the other side but couldn't. He been tired all the time. He been experiencing SOB more. He states he used to be able to walk in the grocery store. Now he can barely go to the grocery store from being tired all the time. I asked him to send a manual transmission for the nurse to review. Transmission received. I let him know the nurse will give him a call back.

## 2022-01-01 NOTE — Telephone Encounter (Signed)
Spoke with the pt and notified of response per Dr Lamonte Sakai. R sent to pharm. Nothing further needed.

## 2022-01-01 NOTE — Telephone Encounter (Addendum)
Patient called to state wght is up 3lb from 12/31/2021, has  slight SOB, no swelling. Pt. Denies eating unusual or high sodium foods. Taking medication as prescribed including lasix 40mg  daily.OV 12/23/21 lasix increased from MWF to Daily, per Lyda Jester PA F/u w/ APP in 2-3 weeks to reassess volume status. If poor response to daily Lasix, consider switch to torsemide.  Overall daily lasix has improved weight and symptoms until today.   Will send to provider for review and further recommendations.   Per Marlyce Huge PA Can switch Furosemide to Torsemide 20 mg daily. Will need BMET same day as f/u next week.  Pt aware, agreeable, and verbalized understanding

## 2022-01-02 ENCOUNTER — Encounter (HOSPITAL_COMMUNITY): Payer: Medicaid Other | Admitting: Internal Medicine

## 2022-01-06 ENCOUNTER — Encounter (HOSPITAL_COMMUNITY): Payer: Self-pay

## 2022-01-06 ENCOUNTER — Other Ambulatory Visit: Payer: Self-pay

## 2022-01-06 ENCOUNTER — Ambulatory Visit (HOSPITAL_COMMUNITY)
Admission: RE | Admit: 2022-01-06 | Discharge: 2022-01-06 | Disposition: A | Discharge status: Home / Self Care | Payer: 59 | Source: Ambulatory Visit | Attending: Family Medicine | Admitting: Family Medicine

## 2022-01-06 VITALS — BP 112/68 | HR 85 | Wt 231.2 lb

## 2022-01-06 DIAGNOSIS — Z6833 Body mass index (BMI) 33.0-33.9, adult: Secondary | ICD-10-CM | POA: Insufficient documentation

## 2022-01-06 DIAGNOSIS — Z09 Encounter for follow-up examination after completed treatment for conditions other than malignant neoplasm: Secondary | ICD-10-CM | POA: Insufficient documentation

## 2022-01-06 DIAGNOSIS — J440 Chronic obstructive pulmonary disease with acute lower respiratory infection: Secondary | ICD-10-CM | POA: Diagnosis not present

## 2022-01-06 DIAGNOSIS — J441 Chronic obstructive pulmonary disease with (acute) exacerbation: Secondary | ICD-10-CM | POA: Diagnosis not present

## 2022-01-06 DIAGNOSIS — J449 Chronic obstructive pulmonary disease, unspecified: Secondary | ICD-10-CM

## 2022-01-06 DIAGNOSIS — I5042 Chronic combined systolic (congestive) and diastolic (congestive) heart failure: Secondary | ICD-10-CM | POA: Diagnosis present

## 2022-01-06 DIAGNOSIS — I442 Atrioventricular block, complete: Secondary | ICD-10-CM

## 2022-01-06 DIAGNOSIS — I10 Essential (primary) hypertension: Secondary | ICD-10-CM | POA: Diagnosis not present

## 2022-01-06 DIAGNOSIS — Z87891 Personal history of nicotine dependence: Secondary | ICD-10-CM | POA: Insufficient documentation

## 2022-01-06 DIAGNOSIS — I110 Hypertensive heart disease with heart failure: Secondary | ICD-10-CM | POA: Diagnosis not present

## 2022-01-06 DIAGNOSIS — R911 Solitary pulmonary nodule: Secondary | ICD-10-CM

## 2022-01-06 DIAGNOSIS — Z79899 Other long term (current) drug therapy: Secondary | ICD-10-CM | POA: Diagnosis not present

## 2022-01-06 DIAGNOSIS — G4733 Obstructive sleep apnea (adult) (pediatric): Secondary | ICD-10-CM | POA: Diagnosis not present

## 2022-01-06 LAB — BASIC METABOLIC PANEL
Anion gap: 9 (ref 5–15)
BUN: 21 mg/dL (ref 8–23)
CO2: 25 mmol/L (ref 22–32)
Calcium: 9.1 mg/dL (ref 8.9–10.3)
Chloride: 105 mmol/L (ref 98–111)
Creatinine, Ser: 1.03 mg/dL (ref 0.61–1.24)
GFR, Estimated: >60 mL/min (ref >60)
Glucose, Bld: 155 mg/dL — ABNORMAL HIGH (ref 70–99)
Potassium: 4.5 mmol/L (ref 3.5–5.1)
Sodium: 139 mmol/L (ref 135–145)

## 2022-01-06 LAB — BRAIN NATRIURETIC PEPTIDE: B Natriuretic Peptide: 149 pg/mL — ABNORMAL HIGH (ref 0.0–100.0)

## 2022-01-06 MED ORDER — FUROSEMIDE 40 MG PO TABS: 60.0000 mg | ORAL_TABLET | Freq: Every day | ORAL | 6 refills | Status: DC

## 2022-01-06 NOTE — Patient Instructions (Addendum)
INCREASE Lasix to 60mg  (1.5 tabs) daily  Labs today and repeat in 2 weeks We will only contact you if something comes back abnormal or we need to make some changes. Otherwise no news is good news!  Your physician recommends that you schedule a follow-up appointment in: 3-4 months with Dr Haroldine Laws  Please call office at [phone] option 2 if you have any questions or concerns.   Do the following things EVERYDAY: Weigh yourself in the morning before breakfast. Write it down and keep it in a log. Take your medicines as prescribed Eat low salt foods--Limit salt (sodium) to 2000 mg per day.  Stay as active as you can everyday Limit all fluids for the day to less than 2 liters   At the [facility], you and your health needs are our priority. As part of our continuing mission to provide you with exceptional heart care, we have created designated Provider Care Teams. These Care Teams include your primary Cardiologist (physician) and Advanced Practice Providers (APPs- Physician Assistants and Nurse Practitioners) who all work together to provide you with the care you need, when you need it.   You may see any of the following providers on your designated Care Team at your next follow up: Dr Glori Bickers Dr Haynes Kerns, NP Lyda Jester, [place] [facility], [place] Audry Riles, PharmD   Please be sure to bring in all your medications bottles to every appointment.

## 2022-01-06 NOTE — Progress Notes (Signed)
ADVANCED HF CLINIC NOTE  HF MD: Dr. Haroldine Laws  Pulmonology: Dr Lamonte Sakai   Reason for Visit:  F/u for Chronic Systolic Heart Failure   HPI: Robert Church is a 66 y/o male with h/o obesity, HTN, COPD with ongoing tobacco use and chronic systolic heart failure.   He underwent cath in 4/18 due to CP. Cath showed normal coronaries with EF 40% and global HK. LVEDP 19.   He saw Dr. Gwenlyn Found on 07/01/2018 because of recurrent chest pain.  Myoview stress test performed 07/21/2018 showed EF 38% inferior scar with septal ischemia.  A 2D echo performed 07/07/2018 revealed an EF of 30 to 35%.  He was referred for cardiac CT.   Cardiac CT on 9/19.  Calcium score 148 (74th percentile)  LAD 25% otherwise normal cors. Dilated pulmonary arteries suggestive of PH.   Worked for [employer]. Retired 5/21. Follows with [name] in Pulmonary Clinic. Was started on bisoprolol 2.5 but couldn't tolerate due to fatigue. Was falling asleep at work. Stopped bisoprolol and felt better.   Echo 12/20 EF improved to 45%. Mild RV dysfunction   Admitted 1/22 for acute on chronic hypoxic respiratory failure secondary to acute COPD and CHF w/ volume overload. He had massive diuresis w/ IV Lasix, diuresed down from admit wt of 265 lb to 210 lb. Echo was repeated 12/21, EF 40-45% (unchanged). RV mildly reduced, RVSP 49.   Followed in clinic, Jardiance and Arlyce Harman added - dose adjusted due to intolerances. He then stopped these on his own.   In 4/22, had near syncope, Zio showed intermittent CHB. Repeat echo EF 30-35%% Had St Jude dual chamber PM 04/16/21. Had some PMT and device adjusted by EP .   Echo 07/31/21 with EF 30-35%,  moderately reduced RV, and diffuse HK worse in inferior base.  Underwent R/L cath in 9/22 for persistent SOB. No CAD. EF 35% Mildly elevated filling pressures with normal output.  Ao =  128/75 (95) LV =  130/24 RA =  10 RV =  40/12 PA = 45/19 (32) PCW = 18  Fick cardiac output/index = 5.1/2.3 PVR  = 2.6 WU FA sat = 94% PA sat = 66%, 66%  Subsequently underwent CRT-D upgrade attempt on 10/07/21 by Dr. Curt Bears due to high-degree of RV pacing. 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   Follow-up 10/20/21.  Noted increased dyspnea. Volume up with > 10 lb weight gain. Lasix increased to 40 mg daily X 1 week then decreased back to 40 mg M, W, F.   Treated with abx and steroids for COPD exacerbation 12/22 in setting of viral illness +/- pneumonia.   Saw EP for follow-up 12/11/21, being considered for Barostim.   Today he returns for HF follow up. Seen 2 weeks ago, volume up ReDs 37% and Lasix in creased to 40 mg daily. He called clinic with 3 lbs weight gain and was instructed to switch to torsemide 20 mg daily. He took this x 2 days but did not urinate briskly, so he switched back to lasix 40 mg daily. He remains SOB walking further distances on flat ground, he attributes this in part due to his COPD and weight. Recent episode of chest tightness that resolved with inhaler. Denies palpitations, abnormal bleeding, CP, dizziness, edema, or PND/Orthopnea. Appetite ok. No fever or chills. Weight at home 227 pounds. Taking all medications. Eats out 3-4 x/times a week. Using O2 at home PRN, once every 2 weeks.  Cardiac studies:  Echo 06/2018 LVEF 30-35%, RV normal  Echo 12/2018 LVEF 40-45%, RV normal  Echo 11/2020 LVEF 40-45%, RV mildly reduced Echo 03/2021 30-35%, RV mildly reduced  Echo 07/2021 EF 30-35% RV moderately reduced   Review of systems complete and found to be negative unless listed in HPI.    Past Medical History:  Diagnosis Date   Adenomatous colon polyp    Allergy    Anal fissure    Asthma    Bronchitis    CHF (congestive heart failure) (HCC)    COPD, group D, by GOLD 2017 classification ([place])    Emphysema lung (HCC)    GERD (gastroesophageal reflux disease)    Hyperlipidemia    Hypertension    NICM (nonischemic cardiomyopathy) (HCC)    OSA  (obstructive sleep apnea)    Pneumonia    Requires supplemental oxygen    Sinusitis    SOB (shortness of breath)     Current Outpatient Medications  Medication Sig Dispense Refill   ADVAIR DISKUS 250-50 MCG/ACT AEPB INHALE 1 PUFF INTO THE LUNGS EVERY 12 HOURS 60 each 11   albuterol (PROVENTIL) (2.5 MG/3ML) 0.083% nebulizer solution Take 3 mLs (2.5 mg total) by nebulization every 4 (four) hours as needed for wheezing or shortness of breath. 75 mL 11   albuterol (VENTOLIN HFA) 108 (90 Base) MCG/ACT inhaler INHALE 2 PUFFS INTO THE LUNGS EVERY 6 HOURS AS NEEDED FOR WHEEZING 18 g 2   allopurinol (ZYLOPRIM) 300 MG tablet Take 1 tablet (300 mg total) by mouth as needed. 90 tablet 0   ALPRAZolam (XANAX) 0.25 MG tablet Take 1 tablet (0.25 mg total) by mouth 3 (three) times daily. 90 tablet 2   aspirin EC 81 MG tablet Take 81 mg by mouth daily. Swallow whole.     DALIRESP 500 MCG TABS tablet Take 0.5 tablets (250 mcg total) by mouth 2 (two) times daily. 30 tablet 5   EPINEPHrine 0.3 mg/0.3 mL IJ SOAJ injection Inject 0.3 mg into the muscle as needed for anaphylaxis.     fluticasone (FLONASE) 50 MCG/ACT nasal spray Place into both nostrils daily.     fluticasone-salmeterol (ADVAIR HFA) 115-21 MCG/ACT inhaler Inhale 2 puffs into the lungs 2 (two) times daily. 12 g 3   Glucosamine HCl (GLUCOSAMINE PO) Take 1 tablet by mouth daily.     guaiFENesin (MUCINEX) 600 MG 12 hr tablet Take 1 tablet (600 mg total) by mouth 2 (two) times daily. 60 tablet 5   ipratropium (ATROVENT) 0.06 % nasal spray Place 2 sprays into both nostrils 4 (four) times daily. 15 mL 3   Lidocaine, Anorectal, 5 % CREA Apply 1 application topically 2 (two) times daily.     losartan (COZAAR) 25 MG tablet Take 0.5 tablets (12.5 mg total) by mouth at bedtime. 15 tablet 3   montelukast (SINGULAIR) 10 MG tablet Take 1 tablet (10 mg total) by mouth at bedtime. 30 tablet 4   nicotine (NICODERM CQ - DOSED IN MG/24 HOURS) 14 mg/24hr patch Place 1  patch (14 mg total) onto the skin daily. 28 patch 0   omeprazole (PRILOSEC) 20 MG capsule Take 1 capsule (20 mg total) by mouth 2 (two) times daily before a meal. ACID REFLUX 180 capsule 1   polyethylene glycol (MIRALAX / GLYCOLAX) 17 g packet Take 17 g by mouth daily as needed. 14 each 0   potassium chloride SA (KLOR-CON M20) 20 MEQ tablet Take 1 tablet (20 mEq total) by mouth daily. 90 tablet 3   predniSONE (  DELTASONE) 10 MG tablet TAKE 1 TABLET(10 MG) BY MOUTH DAILY WITH BREAKFAST 30 tablet 5   torsemide (DEMADEX) 20 MG tablet Take 1 tablet (20 mg total) by mouth daily. 180 tablet 3   colchicine 0.6 MG tablet Take 1 tablet (0.6 mg total) by mouth 2 (two) times daily. (Patient not taking: Reported on 01/06/2022) 180 tablet 0   No current facility-administered medications for this encounter.    Allergies  Allergen Reactions   Spironolactone Anaphylaxis   Clindamycin Other (See Comments)    Tongue swelling   Doxycycline     Severe stomach upset per patient   E-Mycin [Erythromycin Base] Swelling   Jardiance [Empagliflozin] Nausea Only and Other (See Comments)    Lightheadness Dizziness   Penicillins Swelling and Other (See Comments)    Childhood rxn--MD stated he "almost died" Has patient had a PCN reaction causing immediate rash, facial/tongue/throat swelling, SOB or lightheadedness with hypotension:Yes Has patient had a PCN reaction causing severe rash involving mucus membranes or skin necrosis:unsure Has patient had a PCN reaction that required hospitalization:unsure Has patient had a PCN reaction occurring within the last 10 years:No If all of the above answers are "NO", then may proceed with Cephalosporin use.     Rosuvastatin     Myalgias (intolerance)    Social History   Socioeconomic History   Marital status: Single    Spouse name: Not on file   Number of children: 1   Years of education: 12   Highest education level: Not on file  Occupational History   Occupation:  disabled  Tobacco Use   Smoking status: Former    Packs/day: 0.25    Years: 46.00    Pack years: 11.50    Types: Cigarettes   Smokeless tobacco: Never   Tobacco comments:    Smoking very little if he even has one in a week.  Does not even want one.  Vaping Use   Vaping Use: Never used  Substance and Sexual Activity   Alcohol use: Yes    Alcohol/week: 3.0 standard drinks    Types: 3 Cans of beer per week    Comment: 2 beers per day   Drug use: No   Sexual activity: Yes    Partners: Female    Birth control/protection: None  Other Topics Concern   Not on file  Social History Narrative   Right handed   Tea sometimes and coffee (1/2 caffeine)   Social Determinants of Health   Financial Resource Strain: Medium Risk   Difficulty of Paying Living Expenses: Somewhat hard  Food Insecurity: No Food Insecurity   Worried About Charity fundraiser in the Last Year: Never true   Ran Out of Food in the Last Year: Never true  Transportation Needs: No Transportation Needs   Lack of Transportation (Medical): No   Lack of Transportation (Non-Medical): No  Physical Activity: Not on file  Stress: Not on file  Social Connections: Not on file  Intimate Partner Violence: Not on file   Family History  Problem Relation Age of Onset   Lung cancer Father    High blood pressure Mother    Diabetes Maternal Grandmother    Colon polyps Neg Hx    Esophageal cancer Neg Hx    Pancreatic cancer Neg Hx    Stomach cancer Neg Hx    Wt Readings from Last 3 Encounters:  01/06/22 104.9 kg (231 lb 3.2 oz)  12/23/21 105.3 kg (232 lb 3.5 oz)  12/17/21 103.2  kg (227 lb 9.6 oz)   BP 112/68    Pulse 85    Wt 104.9 kg (231 lb 3.2 oz)    SpO2 94%    BMI 33.17 kg/m   PHYSICAL EXAM: ReDs clip 34% General:  NAD. No resp difficulty HEENT: Normal Neck: Supple. Thick neck Carotids 2+ bilat; no bruits. No lymphadenopathy or thryomegaly appreciated. Cor: PMI nondisplaced. Regular rate & rhythm. No rubs,  gallops or murmurs. Lungs: Clear Abdomen: Obese, nontender, nondistended. No hepatosplenomegaly. No bruits or masses. Good bowel sounds. Extremities: No cyanosis, clubbing, rash, edema Neuro: Alert & oriented x 3, cranial nerves grossly intact. Moves all 4 extremities w/o difficulty. Affect pleasant.  ASSESSMENT & PLAN: 1. Chronic systolic HF due to NICM  - cath 4/18 normal cors. EF 48%  - Echo 7/19  EF 30-35%. RV normal.  - Cardiac CT 9/19.  Calcium score 148 (74th percentile)  LAD 25% otherwise normal cors. Dilated pulmonary arteries suggestive of PH.  - Suspect NICM due to HTN and inadequately treated OSA (He now reports improved nightly compliance w/ CPAP). - Echo 1/22 EF 40-45%. RV mildly reduced. RVSP 49 - Admit 4/22 for symptomatic bradycardia/CHF. Echo LVEF 30%. RV mildly reduced - Echo 07/31/21 EF 30-35 % and RV moderately reduced.  - R/LHC 9/22 No CAD EF 35%, Mildly elevated filling pressures with normal output - Failed upgrade to CRT-D due to 99% RV pacing. However RV pacing significantly decreased  - Chronically NYHA Class III, functional class difficult due to COPD and obesity/ deconditioning.  - EP working on [employer]. He has follow up soon. - Volume status looks OK on exam, however ReDs Clip high/normal at 34%, weight up 4 lbs. - Increase Lasix to 60 mg daily (he feels he responds better to furosemide vs torsemide).  - Continue Losartan 12.5 mg daily (failed Entresto due to dizziness/low BP) - No Arlyce Harman (H/o Anaphylaxis)  - Intolerant to Jardiance (positional dizziness and nausea).  - Intolerant bisoprolol due to fatigue, coreg due to dizziness, and toprol due to dry mouth w/ Toprol.  - Given, CHB, biventricular dysfunction, moderate LVH and inability to tolerate HF meds due to orthostasis, there is concern about infiltrative CM (sarcoid, amyloid). cMRI considered but deferred at patient request.  (device MRI compatible per EP but shadowing may make scan less diagnostic)  -  Multiple myeloma panel negative. He does not want to do PYP.  - Remain symptomatic out of proportion to EF, hemodynamics and volume status. Suspect limitation is multfactorial.  - Continue daily weights and limit eating out. - BMET/BNP; repeat BMET in 10-14 days.  2. CHB:  - s/p PPM 4/22 - followed by Dr. Curt Bears  - Failed CRT-D upgrade 10/22 due to subclavian stenosis  3. HTN - Controlled on current regimen.    4. COPD  - GOLD Group D  - Follows with Pulmonary ([name]).  - Home O2 PRN - No longer smoking  - Continue BiPAP   5. OSA - Continue nightly Bipap.  6. Morbid obesity  - I suspect this is a big part of his functional limitation/DOE. - Body mass index is 33.17 kg/m.  7. Pulmonary Nodules - followed by [name] - CT scan 3/22 stable, annual screening advised.   Follow up with Dr. Haroldine Laws in 2-3 months.  Robert Bihari, FNP 12:08 PM

## 2022-01-07 ENCOUNTER — Ambulatory Visit: Payer: 59 | Admitting: Orthopaedic Surgery

## 2022-01-07 ENCOUNTER — Ambulatory Visit (INDEPENDENT_AMBULATORY_CARE_PROVIDER_SITE_OTHER): Payer: 59

## 2022-01-07 DIAGNOSIS — I442 Atrioventricular block, complete: Secondary | ICD-10-CM

## 2022-01-07 LAB — CUP PACEART REMOTE DEVICE CHECK
Battery Remaining Longevity: 103 mo
Battery Remaining Percentage: 94 %
Battery Voltage: 3.01 V
Brady Statistic AP VP Percent: 12 %
Brady Statistic AP VS Percent: 1 %
Brady Statistic AS VP Percent: 47 %
Brady Statistic AS VS Percent: 40 %
Brady Statistic RA Percent Paced: 12 %
Brady Statistic RV Percent Paced: 60 %
Date Time Interrogation Session: 20230118020013
Implantable Lead Implant Date: 20220427
Implantable Lead Implant Date: 20220427
Implantable Lead Location: 753859
Implantable Lead Location: 753860
Implantable Pulse Generator Implant Date: 20220427
Lead Channel Impedance Value: 440 Ohm
Lead Channel Impedance Value: 600 Ohm
Lead Channel Pacing Threshold Amplitude: 0.5 V
Lead Channel Pacing Threshold Amplitude: 0.75 V
Lead Channel Pacing Threshold Pulse Width: 0.5 ms
Lead Channel Pacing Threshold Pulse Width: 0.5 ms
Lead Channel Sensing Intrinsic Amplitude: 11 mV
Lead Channel Sensing Intrinsic Amplitude: 3.5 mV
Lead Channel Setting Pacing Amplitude: 2 V
Lead Channel Setting Pacing Amplitude: 2.5 V
Lead Channel Setting Pacing Pulse Width: 0.5 ms
Lead Channel Setting Sensing Sensitivity: 2 mV
Pulse Gen Model: 2272
Pulse Gen Serial Number: 3920156

## 2022-01-08 ENCOUNTER — Telehealth (INDEPENDENT_AMBULATORY_CARE_PROVIDER_SITE_OTHER): Payer: 59 | Admitting: Gastroenterology

## 2022-01-08 ENCOUNTER — Telehealth: Payer: Self-pay | Admitting: Gastroenterology

## 2022-01-08 ENCOUNTER — Encounter: Payer: Self-pay | Admitting: Gastroenterology

## 2022-01-08 ENCOUNTER — Other Ambulatory Visit: Payer: Self-pay | Admitting: Emergency Medicine

## 2022-01-08 ENCOUNTER — Telehealth: Payer: Self-pay

## 2022-01-08 DIAGNOSIS — K602 Anal fissure, unspecified: Secondary | ICD-10-CM | POA: Diagnosis not present

## 2022-01-08 DIAGNOSIS — K6289 Other specified diseases of anus and rectum: Secondary | ICD-10-CM | POA: Insufficient documentation

## 2022-01-08 MED ORDER — AMBULATORY NON FORMULARY MEDICATION: 1 refills | Status: DC

## 2022-01-08 MED ORDER — LIDOCAINE (ANORECTAL) 5 % EX CREA: 1.0000 "application " | TOPICAL_CREAM | Freq: Two times a day (BID) | CUTANEOUS | 1 refills | Status: DC

## 2022-01-08 NOTE — Patient Instructions (Signed)
Prescriptions for lidocaine gel and nitroglycerin ointment were sent to [facility].  Needs office visit to follow-up in person with Dr. Hilarie Fredrickson in 2 or 3 months.  Our office staff will reach out to him to schedule that.

## 2022-01-08 NOTE — Telephone Encounter (Signed)
Called patient and scheduled office visit with Dr. Hilarie Fredrickson on 02/24/22

## 2022-01-08 NOTE — Telephone Encounter (Signed)
-----   Message from Loralie Champagne, PA-C sent at 01/08/2022  1:40 PM EST ----- Patient had a virtual visit with me today to refill his lidocaine and nitroglycerin prescriptions for his anal fissure.  He was supposed to have surgery for this with Dr. Dema Severin and was also supposed to proceed with an EGD and colonoscopy with Dr. Hilarie Fredrickson at some point, but he has been having a lot of issues with his heart so they canceled his surgery, etc.  I am going to renew his prescriptions, but if we can bring him back into see Dr. Hilarie Fredrickson at his next available office visit in 2 or 3 months so that they can touch base again at that point and determine plan from there.  Please make him an appointment and make him aware.  He knows to be looking for contact from our office.  Thank you,  Jess

## 2022-01-08 NOTE — Telephone Encounter (Signed)
[name], arth are scheduled for a virtual visit with your provider today.    Just as we do with appointments in the office, we must obtain your consent to participate.  Your consent will be active for this visit and any virtual visit you may have with one of our providers in the next 365 days.    If you have a MyChart account, I can also send a copy of this consent to you electronically.  All virtual visits are billed to your insurance company just like a traditional visit in the office.  As this is a virtual visit, video technology does not allow for your provider to perform a traditional examination.  This may limit your provider's ability to fully assess your condition.  If your provider identifies any concerns that need to be evaluated in person or the need to arrange testing such as labs, EKG, etc, we will make arrangements to do so.    Although advances in technology are sophisticated, we cannot ensure that it will always work on either your end or our end.  If the connection with a video visit is poor, we may have to switch to a telephone visit.  With either a video or telephone visit, we are not always able to ensure that we have a secure connection.   I need to obtain your verbal consent now.   Are you willing to proceed with your visit today?   Robert Church has provided verbal consent on 01/08/2022 for a virtual visit (video or telephone).   Laban Emperor. Jaxten Brosh, PA-C 01/08/2022  1:42 PM

## 2022-01-08 NOTE — Progress Notes (Addendum)
01/08/2022 Robert Church [id number] 1956/11/24  I connected with Robert Church on January 08, 2022 at 1:30 pm by video enabled telemedicine application and verified that I am speaking with the correct person.  The patient and the provider were at separate locations during the entire encounter.  I discussed the limitations of evaluation and management by telemedicine and the availability of in person appointments.  Patient expressed understanding and agreed to proceed.  The patient and provider were at different locations for the entirety of the visit.  The patient was at home and the provider was at [facility] gastroenterology office.   HISTORY OF PRESENT ILLNESS: This is a 66 year old male who is a patient of Dr. Vena Rua, seen by him only in April 2022.  At that time they discussed his chronic anorectal pain, fissures, possible fistula.  He was supposed to have an MRI of his pelvis and then have an EGD and colonoscopy at some point and also to see Dr. Dema Severin from colorectal surgery.  A few weeks after that appt he ended up having a pacemaker placed so was not able to have the MRI.  He did eventually see Dr. Dema Severin in September 2022 and was scheduled for surgery in December, but in the meantime he has had a lot of other cardiac issues.  He has COPD/pulmonary issues as well.  Uses oxygen as needed.  Most recent EF is 30 to 35%.  He is scheduled for consultation tomorrow to consider Barostim.  Nonetheless, his surgery with Dr. Dema Severin was canceled until cleared by cardiology and pulmonary.  He was given this virtual visit today as he is requesting refills on his lidocaine gel and nitroglycerin ointment for his perianal issues.  He continues to have intermittent rectal pain from this.  He says that currently he is not really having much pain.  Symptoms definitely are not any worse than they have been in the past.  He does plan to follow-up with Dr. Dema Severin again at some point once these cardiac issues are  improved/resolved.  He denies any abdominal pains.  In regards to his reflux he said that he is not having any issues with that at this time on the omeprazole 20 mg twice daily.   Past Medical History:  Diagnosis Date   Adenomatous colon polyp    Allergy    Anal fissure    Asthma    Bronchitis    CHF (congestive heart failure) (HCC)    COPD, group D, by GOLD 2017 classification ([place])    Emphysema lung (HCC)    GERD (gastroesophageal reflux disease)    Hyperlipidemia    Hypertension    NICM (nonischemic cardiomyopathy) (HCC)    OSA (obstructive sleep apnea)    Pneumonia    Requires supplemental oxygen    Sinusitis    SOB (shortness of breath)    Past Surgical History:  Procedure Laterality Date   BIV UPGRADE N/A 10/07/2021   Procedure: BIV PPM UPGRADE;  Surgeon: Constance Haw, MD;  Location: [facility] [place] [facility];  Service: Cardiovascular;  Laterality: N/A;   LEFT HEART CATH AND CORONARY ANGIOGRAPHY N/A 04/19/2017   Procedure: Left Heart Cath and Coronary Angiography;  Surgeon: Belva Crome, MD;  Location: [facility];  Service: Cardiovascular;  Laterality: N/A;   PACEMAKER IMPLANT N/A 04/16/2021   Procedure: PACEMAKER IMPLANT;  Surgeon: Constance Haw, MD;  Location: [facility];  Service: Cardiovascular;  Laterality: N/A;   RIGHT/LEFT HEART  CATH AND CORONARY ANGIOGRAPHY N/A 08/22/2021   Procedure: RIGHT/LEFT HEART CATH AND CORONARY ANGIOGRAPHY;  Surgeon: Jolaine Artist, MD;  Location: [place] [facility];  Service: Cardiovascular;  Laterality: N/A;   TONSILLECTOMY      reports that he has quit smoking. His smoking use included cigarettes. He has a 11.50 pack-year smoking history. He has never used smokeless tobacco. He reports current alcohol use of about 3.0 standard drinks per week. He reports that he does not use drugs. family history includes Diabetes in his maternal grandmother; High blood pressure in his mother; Lung cancer in his father. Allergies   Allergen Reactions   Spironolactone Anaphylaxis   Clindamycin Other (See Comments)    Tongue swelling   Doxycycline     Severe stomach upset per patient   E-Mycin [Erythromycin Base] Swelling   Jardiance [Empagliflozin] Nausea Only and Other (See Comments)    Lightheadness Dizziness   Penicillins Swelling and Other (See Comments)    Childhood rxn--MD stated he "almost died" Has patient had a PCN reaction causing immediate rash, facial/tongue/throat swelling, SOB or lightheadedness with hypotension:Yes Has patient had a PCN reaction causing severe rash involving mucus membranes or skin necrosis:unsure Has patient had a PCN reaction that required hospitalization:unsure Has patient had a PCN reaction occurring within the last 10 years:No If all of the above answers are "NO", then may proceed with Cephalosporin use.     Rosuvastatin     Myalgias (intolerance)      Outpatient Encounter Medications as of 01/08/2022  Medication Sig   ADVAIR DISKUS 250-50 MCG/ACT AEPB INHALE 1 PUFF INTO THE LUNGS EVERY 12 HOURS   albuterol (PROVENTIL) (2.5 MG/3ML) 0.083% nebulizer solution Take 3 mLs (2.5 mg total) by nebulization every 4 (four) hours as needed for wheezing or shortness of breath.   albuterol (VENTOLIN HFA) 108 (90 Base) MCG/ACT inhaler INHALE 2 PUFFS INTO THE LUNGS EVERY 6 HOURS AS NEEDED FOR WHEEZING   allopurinol (ZYLOPRIM) 300 MG tablet Take 1 tablet (300 mg total) by mouth as needed.   ALPRAZolam (XANAX) 0.25 MG tablet Take 1 tablet (0.25 mg total) by mouth 3 (three) times daily.   aspirin EC 81 MG tablet Take 81 mg by mouth daily. Swallow whole.   colchicine 0.6 MG tablet Take 1 tablet (0.6 mg total) by mouth 2 (two) times daily. (Patient not taking: Reported on 01/06/2022)   DALIRESP 500 MCG TABS tablet Take 0.5 tablets (250 mcg total) by mouth 2 (two) times daily.   EPINEPHrine 0.3 mg/0.3 mL IJ SOAJ injection Inject 0.3 mg into the muscle as needed for anaphylaxis.   fluticasone  (FLONASE) 50 MCG/ACT nasal spray Place into both nostrils daily.   fluticasone-salmeterol (ADVAIR HFA) 115-21 MCG/ACT inhaler Inhale 2 puffs into the lungs 2 (two) times daily.   furosemide (LASIX) 40 MG tablet Take 1.5 tablets (60 mg total) by mouth daily.   Glucosamine HCl (GLUCOSAMINE PO) Take 1 tablet by mouth daily.   guaiFENesin (MUCINEX) 600 MG 12 hr tablet Take 1 tablet (600 mg total) by mouth 2 (two) times daily.   ipratropium (ATROVENT) 0.06 % nasal spray Place 2 sprays into both nostrils 4 (four) times daily.   Lidocaine, Anorectal, 5 % CREA Apply 1 application topically 2 (two) times daily.   losartan (COZAAR) 25 MG tablet Take 0.5 tablets (12.5 mg total) by mouth at bedtime.   montelukast (SINGULAIR) 10 MG tablet Take 1 tablet (10 mg total) by mouth at bedtime.   nicotine (NICODERM CQ -  DOSED IN MG/24 HOURS) 14 mg/24hr patch Place 1 patch (14 mg total) onto the skin daily.   omeprazole (PRILOSEC) 20 MG capsule Take 1 capsule (20 mg total) by mouth 2 (two) times daily before a meal. ACID REFLUX   polyethylene glycol (MIRALAX / GLYCOLAX) 17 g packet Take 17 g by mouth daily as needed.   potassium chloride SA (KLOR-CON M20) 20 MEQ tablet Take 1 tablet (20 mEq total) by mouth daily.   predniSONE (DELTASONE) 10 MG tablet TAKE 1 TABLET(10 MG) BY MOUTH DAILY WITH BREAKFAST   No facility-administered encounter medications on file as of 01/08/2022.    REVIEW OF SYSTEMS  : All other systems reviewed and negative except where noted in the History of Present Illness.   PHYSICAL EXAM: There were no vitals taken for this visit. General: Well developed male in no acute distress; A&O x 3.  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  Was supposed to have an MRI of the pelvis, but then he got a pacemaker placed.  He saw colorectal surgery and was scheduled for surgery, but then has been having a lot more cardiac issues so that was canceled  until his cardiac issues are improved/resolved.  He needs a refill on his lidocaine and nitroglycerin ointment/gels.  Advised that I would send these to his pharmacy, but I would also like him to come for an in office visit with Dr. Hilarie Fredrickson in 2 or 3 months to reassess at that point and determine timing of EGD and colonoscopy as well as to determine any plans moving forward with surgery, etc. He was advised that our staff will contact him with an appointment.   CC:  Janith Lima, MD

## 2022-01-09 ENCOUNTER — Telehealth: Payer: Self-pay | Admitting: Emergency Medicine

## 2022-01-09 ENCOUNTER — Ambulatory Visit (INDEPENDENT_AMBULATORY_CARE_PROVIDER_SITE_OTHER): Payer: 59 | Admitting: Surgery

## 2022-01-09 ENCOUNTER — Other Ambulatory Visit: Payer: Self-pay

## 2022-01-09 ENCOUNTER — Encounter: Payer: Self-pay | Admitting: Surgery

## 2022-01-09 ENCOUNTER — Encounter: Payer: Medicaid Other | Admitting: Cardiology

## 2022-01-09 VITALS — BP 120/80 | HR 81 | Temp 97.5°F | Resp 18 | Ht 70.0 in | Wt 228.0 lb

## 2022-01-09 DIAGNOSIS — I5022 Chronic systolic (congestive) heart failure: Secondary | ICD-10-CM | POA: Diagnosis not present

## 2022-01-09 NOTE — Progress Notes (Signed)
[facility] [place]  Patient name: Robert Church MRN: [id number] DOB: [birth year] Sex: male   REQUESTING PROVIDER:    Oda Kilts   REASON FOR CONSULT:    Barostim evlauation  HISTORY OF PRESENT ILLNESS:   Robert Church is a 66 y.o. male, who is referred for evaluation of Barostim implant.  The patient does suffer from shortness of breath with moderate exertion.  He uses oxygen at night.  He has had a [employer] Jude dual-chamber pacemaker implanted for complete heart block.  He suffers from COPD.  He is medically managed for hypertension.  The patient is on goal-directed medical therapy for his chronic systolic heart failure, but still has significant limitations.  His most recent echo was 30 to 35%.  PAST MEDICAL HISTORY    Past Medical History:  Diagnosis Date   Adenomatous colon polyp    Allergy    Anal fissure    Asthma    Bronchitis    CHF (congestive heart failure) (HCC)    COPD, group D, by GOLD 2017 classification ([place])    Emphysema lung (HCC)    GERD (gastroesophageal reflux disease)    Hyperlipidemia    Hypertension    NICM (nonischemic cardiomyopathy) (HCC)    OSA (obstructive sleep apnea)    Pneumonia    Requires supplemental oxygen    Sinusitis    SOB (shortness of breath)      FAMILY HISTORY   Family History  Problem Relation Age of Onset   Lung cancer Father    High blood pressure Mother    Diabetes Maternal Grandmother    Colon polyps Neg Hx    Esophageal cancer Neg Hx    Pancreatic cancer Neg Hx    Stomach cancer Neg Hx     SOCIAL HISTORY:   Social History   Socioeconomic History   Marital status: Single    Spouse name: Not on file   Number of children: 1   Years of education: 12   Highest education level: Not on file  Occupational History   Occupation: disabled  Tobacco Use   Smoking status: Former    Packs/day: 0.25    Years: 46.00    Pack years: 11.50    Types:  Cigarettes   Smokeless tobacco: Never   Tobacco comments:    Smoking very little if he even has one in a week.  Does not even want one.  Vaping Use   Vaping Use: Never used  Substance and Sexual Activity   Alcohol use: Yes    Alcohol/week: 3.0 standard drinks    Types: 3 Cans of beer per week    Comment: 2 beers per day   Drug use: No   Sexual activity: Yes    Partners: Female    Birth control/protection: None  Other Topics Concern   Not on file  Social History Narrative   Right handed   Tea sometimes and coffee (1/2 caffeine)   Social Determinants of Health   Financial Resource Strain: Medium Risk   Difficulty of Paying Living Expenses: Somewhat hard  Food Insecurity: No Food Insecurity   Worried About Charity fundraiser in the Last Year: Never true   Ran Out of Food in the Last Year: Never true  Transportation Needs: No Transportation Needs   Lack of Transportation (Medical): No   Lack of Transportation (Non-Medical): No  Physical Activity: Not on file  Stress: Not on file  Social Connections: Not on file  Intimate Partner Violence: Not on file    ALLERGIES:    Allergies  Allergen Reactions   Spironolactone Anaphylaxis   Clindamycin Other (See Comments)    Tongue swelling   Doxycycline     Severe stomach upset per patient   E-Mycin [Erythromycin Base] Swelling   Jardiance [Empagliflozin] Nausea Only and Other (See Comments)    Lightheadness Dizziness   Penicillins Swelling and Other (See Comments)    Childhood rxn--MD stated he "almost died" Has patient had a PCN reaction causing immediate rash, facial/tongue/throat swelling, SOB or lightheadedness with hypotension:Yes Has patient had a PCN reaction causing severe rash involving mucus membranes or skin necrosis:unsure Has patient had a PCN reaction that required hospitalization:unsure Has patient had a PCN reaction occurring within the last 10 years:No If all of the above answers are "NO", then may proceed  with Cephalosporin use.     Rosuvastatin     Myalgias (intolerance)    CURRENT MEDICATIONS:    Current Outpatient Medications  Medication Sig Dispense Refill   ADVAIR DISKUS 250-50 MCG/ACT AEPB INHALE 1 PUFF INTO THE LUNGS EVERY 12 HOURS 60 each 11   albuterol (PROVENTIL) (2.5 MG/3ML) 0.083% nebulizer solution Take 3 mLs (2.5 mg total) by nebulization every 4 (four) hours as needed for wheezing or shortness of breath. 75 mL 11   albuterol (VENTOLIN HFA) 108 (90 Base) MCG/ACT inhaler INHALE 2 PUFFS INTO THE LUNGS EVERY 6 HOURS AS NEEDED FOR WHEEZING 18 g 2   allopurinol (ZYLOPRIM) 300 MG tablet Take 1 tablet (300 mg total) by mouth as needed. 90 tablet 0   ALPRAZolam (XANAX) 0.25 MG tablet Take 1 tablet (0.25 mg total) by mouth 3 (three) times daily. 90 tablet 2   AMBULATORY NON FORMULARY MEDICATION Medication Name: Nitroglycerin gel 0.125% apply 2-3 times daily to the anal canal. 30 g 1   aspirin EC 81 MG tablet Take 81 mg by mouth daily. Swallow whole.     colchicine 0.6 MG tablet Take 1 tablet (0.6 mg total) by mouth 2 (two) times daily. (Patient not taking: Reported on 01/06/2022) 180 tablet 0   DALIRESP 500 MCG TABS tablet Take 0.5 tablets (250 mcg total) by mouth 2 (two) times daily. 30 tablet 5   EPINEPHrine 0.3 mg/0.3 mL IJ SOAJ injection Inject 0.3 mg into the muscle as needed for anaphylaxis.     fluticasone (FLONASE) 50 MCG/ACT nasal spray Place into both nostrils daily.     fluticasone-salmeterol (ADVAIR HFA) 115-21 MCG/ACT inhaler Inhale 2 puffs into the lungs 2 (two) times daily. 12 g 3   furosemide (LASIX) 40 MG tablet Take 1.5 tablets (60 mg total) by mouth daily. 45 tablet 6   Glucosamine HCl (GLUCOSAMINE PO) Take 1 tablet by mouth daily.     guaiFENesin (MUCINEX) 600 MG 12 hr tablet Take 1 tablet (600 mg total) by mouth 2 (two) times daily. 60 tablet 5   ipratropium (ATROVENT) 0.06 % nasal spray Place 2 sprays into both nostrils 4 (four) times daily. 15 mL 3   Lidocaine,  Anorectal, 5 % CREA Apply 1 application topically 2 (two) times daily. 30 g 1   losartan (COZAAR) 25 MG tablet Take 0.5 tablets (12.5 mg total) by mouth at bedtime. 15 tablet 3   montelukast (SINGULAIR) 10 MG tablet Take 1 tablet (10 mg total) by mouth at bedtime. 30 tablet 4   nicotine (NICODERM CQ - DOSED IN MG/24 HOURS) 14 mg/24hr patch Place 1 patch (14 mg total) onto the skin daily.  28 patch 0   omeprazole (PRILOSEC) 20 MG capsule Take 1 capsule (20 mg total) by mouth 2 (two) times daily before a meal. ACID REFLUX 180 capsule 1   polyethylene glycol (MIRALAX / GLYCOLAX) 17 g packet Take 17 g by mouth daily as needed. 14 each 0   potassium chloride SA (KLOR-CON M20) 20 MEQ tablet Take 1 tablet (20 mEq total) by mouth daily. 90 tablet 3   predniSONE (DELTASONE) 10 MG tablet TAKE 1 TABLET(10 MG) BY MOUTH DAILY WITH BREAKFAST 30 tablet 5   No current facility-administered medications for this visit.    REVIEW OF SYSTEMS:   [X]  denotes positive finding, [ ]  denotes negative finding Cardiac  Comments:  Chest pain or chest pressure:    Shortness of breath upon exertion: x   Short of breath when lying flat: x   Irregular heart rhythm:        Vascular    Pain in calf, thigh, or hip brought on by ambulation:    Pain in feet at night that wakes you up from your sleep:     Blood clot in your veins:    Leg swelling:         Pulmonary    Oxygen at home:    Productive cough:     Wheezing:         Neurologic    Sudden weakness in arms or legs:     Sudden numbness in arms or legs:     Sudden onset of difficulty speaking or slurred speech:    Temporary loss of vision in one eye:     Problems with dizziness:         Gastrointestinal    Blood in stool:      Vomited blood:         Genitourinary    Burning when urinating:     Blood in urine:        Psychiatric    Major depression:         Hematologic    Bleeding problems:    Problems with blood clotting too easily:        Skin     Rashes or ulcers:        Constitutional    Fever or chills:     PHYSICAL EXAM:   There were no vitals filed for this visit.  GENERAL: The patient is a well-nourished male, in no acute distress. The vital signs are documented above. CARDIAC: There is a regular rate and rhythm.  VASCULAR: SonoSite was used to evaluate his carotid bifurcation which is in the mid neck and easily accessible PULMONARY: Nonlabored respirations MUSCULOSKELETAL: There are no major deformities or cyanosis. NEUROLOGIC: No focal weakness or paresthesias are detected. SKIN: There are no ulcers or rashes noted. PSYCHIATRIC: The patient has a normal affect.  STUDIES:   I have reviewed his ultrasound with the following findings: Right Carotid: Velocities in the right ICA are consistent with a 1-39%  stenosis.   Left Carotid: Velocities in the left ICA are consistent with a 1-39%  stenosis.   Vertebrals:  Bilateral vertebral arteries demonstrate antegrade flow.  Subclavians: Normal flow hemodynamics were seen in bilateral subclavian               arteries.   ASSESSMENT and PLAN   NYHA class III heart failure on goal-directed medical therapy with persistent symptoms.  I think the patient will be a good candidate for Barostim implant to help with symptoms.  I discussed the details of the operation including the risks and benefits.  He eagerly wants to proceed.  This will be done as an outpatient procedure.  He does have COPD and therefore I am going to get pulmonary clearance from Dr. Jasmine Awe, MD, FACS [facility] [phone] Pager [phone]

## 2022-01-09 NOTE — H&P (View-Only) (Signed)
[facility] Specialist of [place]  Patient name: Robert Church MRN: [id number] DOB: [birth year] Sex: male   REQUESTING PROVIDER:    Oda Kilts   REASON FOR CONSULT:    Barostim evlauation  HISTORY OF PRESENT ILLNESS:   Robert Church is a 66 y.o. male, who is referred for evaluation of Barostim implant.  The patient does suffer from shortness of breath with moderate exertion.  He uses oxygen at night.  He has had a Saint Jude dual-chamber pacemaker implanted for complete heart block.  He suffers from COPD.  He is medically managed for hypertension.  The patient is on goal-directed medical therapy for his chronic systolic heart failure, but still has significant limitations.  His most recent echo was 30 to 35%.  PAST MEDICAL HISTORY    Past Medical History:  Diagnosis Date   Adenomatous colon polyp    Allergy    Anal fissure    Asthma    Bronchitis    CHF (congestive heart failure) (HCC)    COPD, group D, by GOLD 2017 classification ([place])    Emphysema lung (HCC)    GERD (gastroesophageal reflux disease)    Hyperlipidemia    Hypertension    NICM (nonischemic cardiomyopathy) (HCC)    OSA (obstructive sleep apnea)    Pneumonia    Requires supplemental oxygen    Sinusitis    SOB (shortness of breath)      FAMILY HISTORY   Family History  Problem Relation Age of Onset   Lung cancer Father    High blood pressure Mother    Diabetes Maternal Grandmother    Colon polyps Neg Hx    Esophageal cancer Neg Hx    Pancreatic cancer Neg Hx    Stomach cancer Neg Hx     SOCIAL HISTORY:   Social History   Socioeconomic History   Marital status: Single    Spouse name: Not on file   Number of children: 1   Years of education: 12   Highest education level: Not on file  Occupational History   Occupation: disabled  Tobacco Use   Smoking status: Former    Packs/day: 0.25    Years: 46.00    Pack years: 11.50    Types:  Cigarettes   Smokeless tobacco: Never   Tobacco comments:    Smoking very little if he even has one in a week.  Does not even want one.  Vaping Use   Vaping Use: Never used  Substance and Sexual Activity   Alcohol use: Yes    Alcohol/week: 3.0 standard drinks    Types: 3 Cans of beer per week    Comment: 2 beers per day   Drug use: No   Sexual activity: Yes    Partners: Female    Birth control/protection: None  Other Topics Concern   Not on file  Social History Narrative   Right handed   Tea sometimes and coffee (1/2 caffeine)   Social Determinants of Health   Financial Resource Strain: Medium Risk   Difficulty of Paying Living Expenses: Somewhat hard  Food Insecurity: No Food Insecurity   Worried About Charity fundraiser in the Last Year: Never true   Ran Out of Food in the Last Year: Never true  Transportation Needs: No Transportation Needs   Lack of Transportation (Medical): No   Lack of Transportation (Non-Medical): No  Physical Activity: Not on file  Stress: Not on file  Social Connections: Not on file  Intimate Partner Violence: Not on file    ALLERGIES:    Allergies  Allergen Reactions   Spironolactone Anaphylaxis   Clindamycin Other (See Comments)    Tongue swelling   Doxycycline     Severe stomach upset per patient   E-Mycin [Erythromycin Base] Swelling   Jardiance [Empagliflozin] Nausea Only and Other (See Comments)    Lightheadness Dizziness   Penicillins Swelling and Other (See Comments)    Childhood rxn--MD stated he "almost died" Has patient had a PCN reaction causing immediate rash, facial/tongue/throat swelling, SOB or lightheadedness with hypotension:Yes Has patient had a PCN reaction causing severe rash involving mucus membranes or skin necrosis:unsure Has patient had a PCN reaction that required hospitalization:unsure Has patient had a PCN reaction occurring within the last 10 years:No If all of the above answers are "NO", then may proceed  with Cephalosporin use.     Rosuvastatin     Myalgias (intolerance)    CURRENT MEDICATIONS:    Current Outpatient Medications  Medication Sig Dispense Refill   ADVAIR DISKUS 250-50 MCG/ACT AEPB INHALE 1 PUFF INTO THE LUNGS EVERY 12 HOURS 60 each 11   albuterol (PROVENTIL) (2.5 MG/3ML) 0.083% nebulizer solution Take 3 mLs (2.5 mg total) by nebulization every 4 (four) hours as needed for wheezing or shortness of breath. 75 mL 11   albuterol (VENTOLIN HFA) 108 (90 Base) MCG/ACT inhaler INHALE 2 PUFFS INTO THE LUNGS EVERY 6 HOURS AS NEEDED FOR WHEEZING 18 g 2   allopurinol (ZYLOPRIM) 300 MG tablet Take 1 tablet (300 mg total) by mouth as needed. 90 tablet 0   ALPRAZolam (XANAX) 0.25 MG tablet Take 1 tablet (0.25 mg total) by mouth 3 (three) times daily. 90 tablet 2   AMBULATORY NON FORMULARY MEDICATION Medication Name: Nitroglycerin gel 0.125% apply 2-3 times daily to the anal canal. 30 g 1   aspirin EC 81 MG tablet Take 81 mg by mouth daily. Swallow whole.     colchicine 0.6 MG tablet Take 1 tablet (0.6 mg total) by mouth 2 (two) times daily. (Patient not taking: Reported on 01/06/2022) 180 tablet 0   DALIRESP 500 MCG TABS tablet Take 0.5 tablets (250 mcg total) by mouth 2 (two) times daily. 30 tablet 5   EPINEPHrine 0.3 mg/0.3 mL IJ SOAJ injection Inject 0.3 mg into the muscle as needed for anaphylaxis.     fluticasone (FLONASE) 50 MCG/ACT nasal spray Place into both nostrils daily.     fluticasone-salmeterol (ADVAIR HFA) 115-21 MCG/ACT inhaler Inhale 2 puffs into the lungs 2 (two) times daily. 12 g 3   furosemide (LASIX) 40 MG tablet Take 1.5 tablets (60 mg total) by mouth daily. 45 tablet 6   Glucosamine HCl (GLUCOSAMINE PO) Take 1 tablet by mouth daily.     guaiFENesin (MUCINEX) 600 MG 12 hr tablet Take 1 tablet (600 mg total) by mouth 2 (two) times daily. 60 tablet 5   ipratropium (ATROVENT) 0.06 % nasal spray Place 2 sprays into both nostrils 4 (four) times daily. 15 mL 3   Lidocaine,  Anorectal, 5 % CREA Apply 1 application topically 2 (two) times daily. 30 g 1   losartan (COZAAR) 25 MG tablet Take 0.5 tablets (12.5 mg total) by mouth at bedtime. 15 tablet 3   montelukast (SINGULAIR) 10 MG tablet Take 1 tablet (10 mg total) by mouth at bedtime. 30 tablet 4   nicotine (NICODERM CQ - DOSED IN MG/24 HOURS) 14 mg/24hr patch Place 1 patch (14 mg total) onto the skin daily.  28 patch 0   omeprazole (PRILOSEC) 20 MG capsule Take 1 capsule (20 mg total) by mouth 2 (two) times daily before a meal. ACID REFLUX 180 capsule 1   polyethylene glycol (MIRALAX / GLYCOLAX) 17 g packet Take 17 g by mouth daily as needed. 14 each 0   potassium chloride SA (KLOR-CON M20) 20 MEQ tablet Take 1 tablet (20 mEq total) by mouth daily. 90 tablet 3   predniSONE (DELTASONE) 10 MG tablet TAKE 1 TABLET(10 MG) BY MOUTH DAILY WITH BREAKFAST 30 tablet 5   No current facility-administered medications for this visit.    REVIEW OF SYSTEMS:   [X]  denotes positive finding, [ ]  denotes negative finding Cardiac  Comments:  Chest pain or chest pressure:    Shortness of breath upon exertion: x   Short of breath when lying flat: x   Irregular heart rhythm:        Vascular    Pain in calf, thigh, or hip brought on by ambulation:    Pain in feet at night that wakes you up from your sleep:     Blood clot in your veins:    Leg swelling:         Pulmonary    Oxygen at home:    Productive cough:     Wheezing:         Neurologic    Sudden weakness in arms or legs:     Sudden numbness in arms or legs:     Sudden onset of difficulty speaking or slurred speech:    Temporary loss of vision in one eye:     Problems with dizziness:         Gastrointestinal    Blood in stool:      Vomited blood:         Genitourinary    Burning when urinating:     Blood in urine:        Psychiatric    Major depression:         Hematologic    Bleeding problems:    Problems with blood clotting too easily:        Skin     Rashes or ulcers:        Constitutional    Fever or chills:     PHYSICAL EXAM:   There were no vitals filed for this visit.  GENERAL: The patient is a well-nourished male, in no acute distress. The vital signs are documented above. CARDIAC: There is a regular rate and rhythm.  VASCULAR: SonoSite was used to evaluate his carotid bifurcation which is in the mid neck and easily accessible PULMONARY: Nonlabored respirations MUSCULOSKELETAL: There are no major deformities or cyanosis. NEUROLOGIC: No focal weakness or paresthesias are detected. SKIN: There are no ulcers or rashes noted. PSYCHIATRIC: The patient has a normal affect.  STUDIES:   I have reviewed his ultrasound with the following findings: Right Carotid: Velocities in the right ICA are consistent with a 1-39%  stenosis.   Left Carotid: Velocities in the left ICA are consistent with a 1-39%  stenosis.   Vertebrals:  Bilateral vertebral arteries demonstrate antegrade flow.  Subclavians: Normal flow hemodynamics were seen in bilateral subclavian               arteries.   ASSESSMENT and PLAN   NYHA class III heart failure on goal-directed medical therapy with persistent symptoms.  I think the patient will be a good candidate for Barostim implant to help with symptoms.  I discussed the details of the operation including the risks and benefits.  He eagerly wants to proceed.  This will be done as an outpatient procedure.  He does have COPD and therefore I am going to get pulmonary clearance from Dr. Jasmine Awe, MD, FACS [facility] [phone] Pager [phone]

## 2022-01-12 ENCOUNTER — Telehealth: Payer: Self-pay

## 2022-01-12 NOTE — Progress Notes (Signed)
Addendum: Reviewed and agree with assessment and management plan. This patient remains very overdue for colonoscopy.  We have discussed this in the past.  Colonoscopy remains recommended.  I recommend he follow-up with [place] to further discuss colonoscopy. Shawntay Prest, Lajuan Lines, MD

## 2022-01-12 NOTE — Telephone Encounter (Signed)
Patient was last seen on 12/17/21  [name] please advise if patient is cleared for surgery   Jeani Hawking from [facility] [phone]  Courtney Heys 3 days ago   Jeani Hawking states needs surgical clearance for patient. Patient having surgery on 01/16/2022. Please call  [facility] phone number is [phone]. Fax number is [phone].

## 2022-01-12 NOTE — Telephone Encounter (Signed)
Received a call from patient requesting to reschedule Barostim surgery from Jan 27 due to transportation and no one available to stay with him except on Thursdays. Patient rescheduled for Feb 9. Instructions reviewed. Patient verbalized understanding.

## 2022-01-13 ENCOUNTER — Ambulatory Visit: Payer: 59 | Admitting: Orthopaedic Surgery

## 2022-01-13 NOTE — Telephone Encounter (Signed)
Notified [employer]. [employer] device rep of patient's upcoming procedure.

## 2022-01-14 NOTE — Telephone Encounter (Signed)
I addended his most recent progress note from 12/28.  Please fax this.  Thank you

## 2022-01-14 NOTE — Telephone Encounter (Signed)
ATC Kea to let her know that OV notes have been faxed over for surgical clearance, left detailed message. Nothing further needed at this time.

## 2022-01-19 ENCOUNTER — Other Ambulatory Visit: Payer: Self-pay

## 2022-01-19 ENCOUNTER — Other Ambulatory Visit: Payer: Self-pay | Admitting: Internal Medicine

## 2022-01-19 ENCOUNTER — Encounter: Payer: Medicaid Other | Admitting: Surgery

## 2022-01-19 ENCOUNTER — Ambulatory Visit (HOSPITAL_COMMUNITY)
Admission: RE | Admit: 2022-01-19 | Discharge: 2022-01-19 | Disposition: A | Discharge status: Home / Self Care | Payer: 59 | Source: Ambulatory Visit | Attending: Cardiology | Admitting: Cardiology

## 2022-01-19 DIAGNOSIS — M109 Gout, unspecified: Secondary | ICD-10-CM

## 2022-01-19 DIAGNOSIS — I5042 Chronic combined systolic (congestive) and diastolic (congestive) heart failure: Secondary | ICD-10-CM | POA: Insufficient documentation

## 2022-01-19 DIAGNOSIS — M1A0790 Idiopathic chronic gout, unspecified ankle and foot, without tophus (tophi): Secondary | ICD-10-CM

## 2022-01-19 LAB — BASIC METABOLIC PANEL
Anion gap: 11 (ref 5–15)
BUN: 16 mg/dL (ref 8–23)
CO2: 26 mmol/L (ref 22–32)
Calcium: 9.1 mg/dL (ref 8.9–10.3)
Chloride: 103 mmol/L (ref 98–111)
Creatinine, Ser: 0.8 mg/dL (ref 0.61–1.24)
GFR, Estimated: >60 mL/min (ref >60)
Glucose, Bld: 125 mg/dL — ABNORMAL HIGH (ref 70–99)
Potassium: 4.4 mmol/L (ref 3.5–5.1)
Sodium: 140 mmol/L (ref 135–145)

## 2022-01-19 LAB — URIC ACID: Uric Acid, Serum: 9.2 mg/dL — ABNORMAL HIGH (ref 3.7–8.6)

## 2022-01-19 MED ORDER — ALLOPURINOL 300 MG PO TABS: 300.0000 mg | ORAL_TABLET | Freq: Every day | ORAL | 0 refills | Status: DC

## 2022-01-19 NOTE — Addendum Note (Signed)
Encounter addended by: Kerry Dory, CMA on: 01/19/2022 11:46 AM  Actions taken: Visit diagnoses modified, Order list changed, Diagnosis association updated

## 2022-01-20 ENCOUNTER — Other Ambulatory Visit (HOSPITAL_COMMUNITY): Payer: 59

## 2022-01-20 NOTE — Progress Notes (Signed)
Remote pacemaker transmission.   

## 2022-01-23 ENCOUNTER — Ambulatory Visit: Payer: 59 | Admitting: Nurse Practitioner

## 2022-01-27 ENCOUNTER — Other Ambulatory Visit: Payer: Self-pay | Admitting: Surgery

## 2022-01-27 ENCOUNTER — Other Ambulatory Visit: Payer: Self-pay

## 2022-01-27 ENCOUNTER — Encounter: Payer: Self-pay | Admitting: Cardiology

## 2022-01-27 ENCOUNTER — Encounter (HOSPITAL_COMMUNITY): Payer: Self-pay | Admitting: Surgery

## 2022-01-27 ENCOUNTER — Telehealth: Payer: Self-pay

## 2022-01-27 NOTE — Progress Notes (Deleted)
PCR is Dr. Scarlette Calico Pulmonologist is [name] Cardiologist  is Dr. Gwenlyn Found Electrophysiologist is Dr. Modena Jansky.

## 2022-01-27 NOTE — Progress Notes (Signed)
[place] DEVICE PROGRAMMING  Patient Information: Name:  Robert Church  DOB:  09-Aug-1956  MRN:  [id number]     Planned Procedure: Insertion of Barostim Implant  Surgeon:  Dr. Glynda Jaeger  Date of Procedure: 01/29/22  Cautery will be used.  Position during surgery:  supine   Please send documentation back to:  [facility] (Fax # [phone])   Hope Budds, RN  01/27/2022 4:00 PM   Device Information:  Clinic EP Physician:  Allegra Lai, MD   Device Type:  Pacemaker Manufacturer and Phone #:  [employer]: [phone] Pacemaker Dependent?:  No. Date of Last Device Check:  12/11/21 in clinic, 01/13/22 remote Normal Device Function?:  Yes.    Electrophysiologist's Recommendations:  Have magnet available. Provide continuous ECG monitoring when magnet is used or reprogramming is to be performed.  Procedure may interfere with device function.  Magnet should be placed over device during procedure. Per [facility] Jude rep) patient does not need to be reprogrammed.   Per Device Clinic Standing Orders, Willadean Carol, [place]  4:35 PM 01/27/2022

## 2022-01-27 NOTE — Progress Notes (Signed)
Robert Church said that he can only take Xanax without food, the other pills make him sick with out food.

## 2022-01-27 NOTE — Progress Notes (Signed)
Robert Church denies chest pain, patient states that he is continuously short of breath.  Robert Church was expose on 01/23/22 to Covid, patient was tested for Covid today.   Robert Church uses oxygen if O2 sat goes under 90%. Robert Church has chest congestion for 4-5 months, he stated.  PCP is Dr.Thomas Ronnald Ramp Pulmonologist is Dr. Shawna Clamp Cardiologist is Dr. Gwenlyn Found Electrophysiologist is Dr. Modena Jansky Robert Church is seen in by Dr. Fransisca Kaufmann in the CHF clinic.  I instructed Robert Church to shower with antibiotic soap, if it is available.  Dry off with a clean towel. Do not put lotion, powder, cologne or deodorant or makeup.No jewelry or piercings. Men may shave their face and neck. Woman should not shave. No nail polish, artificial or acrylic nails. Wear clean clothes, brush your teeth. Glasses, contact lens,dentures or partials may not be worn in the OR. If you need to wear them, please bring a case for glasses, do not wear contacts or bring a case, the hospital does not have contact cases, dentures or partials will have to be removed , make sure they are clean, we will provide a denture cup to put them in. You will need some one to drive you home and a responsible person over the age of 86 to stay with you for the first 24 hours after surgery.

## 2022-01-27 NOTE — Telephone Encounter (Signed)
Pt contacted office advising that he has been exposed to someone who tested positive for Covid- last contact date of Feb 3. He denies any symptoms. Pt is scheduled for Barostim insertion surgery on Feb 9. Provided pt with address to [facility] pre-procedural Covid testing site to obtain test today. Pt verbalized understanding.

## 2022-01-28 MED ORDER — VANCOMYCIN HCL 1500 MG/300ML IV SOLN
1500.0000 mg | INTRAVENOUS | Status: AC
  Administered 2022-01-29: 1500 mg via INTRAVENOUS
  Filled 2022-01-28 (×2): qty 300

## 2022-01-28 NOTE — Anesthesia Preprocedure Evaluation (Addendum)
Anesthesia Evaluation  Patient identified by MRN, date of birth, ID band Patient awake    Reviewed: Allergy & Precautions, NPO status , Patient's Chart, lab work & pertinent test results  Airway Mallampati: III  TM Distance: >3 FB Neck ROM: Full    Dental  (+) Teeth Intact, Dental Advisory Given, Chipped,    Pulmonary asthma , sleep apnea, Continuous Positive Airway Pressure Ventilation and Oxygen sleep apnea , COPD,  oxygen dependent, Current Smoker,    Pulmonary exam normal breath sounds clear to auscultation       Cardiovascular hypertension, Pt. on medications +CHF (Chronic systolic congestive heart failure, NYHA class 3)  Normal cardiovascular exam+ dysrhythmias + pacemaker (s/p St. Jude dual chamber PPM 04/16/21)  Rhythm:Regular Rate:Normal  Echo 07/31/21: IMPRESSIONS  1. Diffuse hypokinesis worse in inferior base with abnormal septal motion  EF similar to TTE done 04/16/21 . Left ventricular ejection fraction, by  estimation, is 30 to 35%. The left ventricle has moderately decreased  function. The left ventricle  demonstrates global hypokinesis. There is mild left ventricular  hypertrophy. Left ventricular diastolic parameters are indeterminate.  2. Right ventricular systolic function is moderately reduced. The right  ventricular size is mildly enlarged. There is normal pulmonary artery  systolic pressure.  3. The mitral valve is abnormal. Mild mitral valve regurgitation. No  evidence of mitral stenosis.  4. The aortic valve is tricuspid. Aortic valve regurgitation is not  visualized. Mild aortic valve sclerosis is present, with no evidence of  aortic valve stenosis.  5. Aortic dilatation noted. There is mild dilatation of the ascending  aorta, measuring 40 mm.  6. The inferior vena cava is dilated in size with <50% respiratory  variability, suggesting right atrial pressure of 15 mmHg.    Neuro/Psych negative  neurological ROS     GI/Hepatic Neg liver ROS, hiatal hernia, GERD  Medicated,  Endo/Other  diabetesObesity   Renal/GU negative Renal ROS     Musculoskeletal negative musculoskeletal ROS (+)   Abdominal   Peds  Hematology negative hematology ROS (+)   Anesthesia Other Findings   Reproductive/Obstetrics                           Anesthesia Physical Anesthesia Plan  ASA: 4  Anesthesia Plan: General   Post-op Pain Management: Tylenol PO (pre-op)   Induction: Intravenous  PONV Risk Score and Plan: 1 and Treatment may vary due to age or medical condition, Dexamethasone and Ondansetron  Airway Management Planned: Oral ETT  Additional Equipment: Arterial line  Intra-op Plan:   Post-operative Plan: Extubation in OR  Informed Consent: I have reviewed the patients History and Physical, chart, labs and discussed the procedure including the risks, benefits and alternatives for the proposed anesthesia with the patient or authorized representative who has indicated his/her understanding and acceptance.     Dental advisory given  Plan Discussed with: CRNA  Anesthesia Plan Comments: (See PAT note written 01/28/2022 by Myra Gianotti, PA-C. )      Anesthesia Quick Evaluation

## 2022-01-28 NOTE — Progress Notes (Signed)
Anesthesia Chart Review:  Case: [id number] Date/Time: 01/29/22 0715   Procedure: INSERTION OF RIGHT BAROSTIM (Right)   Anesthesia type: General   Pre-op diagnosis: Chronic systolic congestive heart failure, NYHA class 3   Location: [facility] OR ROOM 11 / [facility]   Surgeons: Serafina Mitchell, MD       DISCUSSION: Patient is a 66 year old male scheduled for the above procedure. He has NICM with HFrEF, NYHA class III heart failure on goal-directed medical therapy with persistent symptoms. Unsuccessful attempt to upgrade PPM to CRT-P on 10/07/21. He was referred for Barostim insertion.   History includes smoking, COPD (Gold Stage D; ;home O2 as needed), asthma, dyspnea, OSA (CPAP), HTN, HLD, CHF, non-ischemic cardiomyopathy, CHB (s/p St. Jude dual chamber PPM 04/16/21, unsuccessful CRT-P upgrade attempt 10/07/21)  GERD, hiatal hernia.   Perioperative PPM Rx per EP: Device Information: Clinic EP Physician:  Allegra Lai, MD  Device Type:  Pacemaker Manufacturer and Phone #:  St. Jude/Abbott: [phone] Pacemaker Dependent?:  No. Date of Last Device Check:  12/11/21 in clinic, 01/13/22 remote     Normal Device Function?:  Yes.     Electrophysiologist's Recommendations: Have magnet available. Provide continuous ECG monitoring when magnet is used or reprogramming is to be performed.  Procedure may interfere with device function.  Magnet should be placed over device during procedure. Per [facility] Jude rep) patient does not need to be reprogrammed.   [name] last evaluated Mr. Ducat on 12/17/21.  Continue Dulera, Daliresp, Singulair, prednisone 10 mg daily and as needed albuterol. Also on Advair and Flonase nasal spray  Guaifenesin 600 mg twice daily added to regimen. Six month follow-up planned. Dr. Trula Slade did contact him about preoperative pulmonary input, he added: "Surgical Risk Assessment:   Patient being evaluated for vascular surgery.  Based on his severity of COPD, chronic symptoms,  prednisone use he is high risk for general anesthesia or surgical procedure.  This risk includes prolonged mechanical ventilation, prolonged hospitalization, even increased mortality.  That said there is no absolute contraindication to proceeding with surgery if the benefits outweigh these risks.  Certainly patient needs to be in a stable state without any evidence of an acute exacerbation including fever, increased dyspnea, increased wheezing, increased sputum production or purulent sputum.  If any of the symptoms are present then procedure would need to be deferred." Patient reported to PAT RN during phone interview that he had not new respiratory symptoms since he last saw [name]. He had been exposed to COVID-19 on 01/23/22, so has a pending COVID-19 test.   01/27/22 COVID-19 test results pending. Anesthesia team to evaluate on the day of surgery. He is on daily prednisone, reported can't take it or other meds (except Xanax) without food or it will make him feel sick.  Defer any additional preoperative medication orders to anesthesiologist and/or surgeon.    VS:  BP Readings from Last 3 Encounters:  01/09/22 120/80  01/06/22 112/68  12/23/21 130/78   Pulse Readings from Last 3 Encounters:  01/09/22 81  01/06/22 85  12/23/21 78     PROVIDERS: Janith Lima, MD is PCP Baltazar Apo, MD is pulmonologist Quay Burow, MD is primary cardiologist Glori Bickers, MD is HF cardiologist Curt Bears, Will, MD is EP cardiologist   LABS: Labs as indicated/ordered on the day of surgery. Currently most recent lab results include: Lab Results  Component Value Date   WBC 9.1 09/26/2021   HGB 15.4 09/26/2021   HCT 44.4  09/26/2021   PLT 215 09/26/2021   GLUCOSE 125 (H) 01/19/2022   ALT 23 07/23/2021   AST 35 07/23/2021   NA 140 01/19/2022   K 4.4 01/19/2022   CL 103 01/19/2022   CREATININE 0.80 01/19/2022   BUN 16 01/19/2022   CO2 26 01/19/2022   HGBA1C 5.6 10/20/2021      IMAGES: CXDR 11/24/21: FINDINGS: Trachea is midline. Heart is enlarged. Pacemaker lead tips are in the right atrium and right ventricle. Linear atelectasis or scarring in the lingula. There is a rounded area of opacification in the anterior mid chest on the lateral view. No definite correlate on the frontal view. Lungs are otherwise clear. No pleural fluid. IMPRESSION: Rounded area of opacification in the anterior mid chest on the lateral view, not well seen on the frontal view. Also not well corroborated on 10/07/2021 although the patient's arm is somewhat obscuring this area. Consider follow-up PA and lateral views of the chest in 4 weeks to ensure resolution as a pulmonary nodule cannot be excluded. - Per Rexene Edison, NP: "Reviewed at office visit and antibiotics were given. Has a follow-up visit and will need serial follow-up"   EKG: 12/11/21 ([facility]): Vpaced. Negative anterolateral T waves.    CV: US Carotid 12/18/21: Summary:  - Right Carotid: Velocities in the right ICA are consistent with a 1-39%  stenosis.  - Left Carotid: Velocities in the left ICA are consistent with a 1-39%  stenosis.  - Vertebrals:  Bilateral vertebral arteries demonstrate antegrade flow.  - Subclavians: Normal flow hemodynamics were seen in bilateral subclavian arteries.    RHC/LHC 08/22/21:    There is moderate left ventricular systolic dysfunction.   Findings: Ao =  128/75 (95) LV =  130/24 RA =  10 RV =  40/12 PA = 45/19 (32) PCW = 18  Fick cardiac output/index = 5.1/2.3 PVR = 2.6 WU FA sat = 94% PA sat = 66%, 66%   Assessment: Normal coronary arteries Mildly elevated filling pressures with normal output NICM EF 30-35%   Plan/Discussion: - Volume mildly elevated but not enough to explain his degree of symptoms. Suspect some of his dyspnea related to his body habitus. Restart lasix 40mg  Monday and Friday. Encouraged weight loss.  - Pending f/u with EP to discuss  upgrade for CRT for LBBB and EF 30-35%    Echo 07/31/21: IMPRESSIONS   1. Diffuse hypokinesis worse in inferior base with abnormal septal motion  EF similar to TTE done 04/16/21 . Left ventricular ejection fraction, by  estimation, is 30 to 35%. The left ventricle has moderately decreased  function. The left ventricle  demonstrates global hypokinesis. There is mild left ventricular  hypertrophy. Left ventricular diastolic parameters are indeterminate.   2. Right ventricular systolic function is moderately reduced. The right  ventricular size is mildly enlarged. There is normal pulmonary artery  systolic pressure.   3. The mitral valve is abnormal. Mild mitral valve regurgitation. No  evidence of mitral stenosis.   4. The aortic valve is tricuspid. Aortic valve regurgitation is not  visualized. Mild aortic valve sclerosis is present, with no evidence of  aortic valve stenosis.   5. Aortic dilatation noted. There is mild dilatation of the ascending  aorta, measuring 40 mm.   6. The inferior vena cava is dilated in size with <50% respiratory  variability, suggesting right atrial pressure of 15 mmHg.    Cardiac event monitor 04/08/21-04/20/21:  1. SR/ST/SB 2. PAF, flutter 3. Occasional PVCs.  4. 2nd  degree AVB 5. CHB, long run of P waves 6. Needs ROV   Past Medical History:  Diagnosis Date   Adenomatous colon polyp    Allergy    Anal fissure    Arthritis    Asthma    Bronchitis    CHF (congestive heart failure) (HCC)    COPD, group D, by GOLD 2017 classification ([place])    Dyspnea    Emphysema lung (HCC)    GERD (gastroesophageal reflux disease)    History of hiatal hernia    Hyperlipidemia    Hypertension    NICM (nonischemic cardiomyopathy) (HCC)    OSA (obstructive sleep apnea)    Pneumonia    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   Requires supplemental oxygen    Sinusitis    SOB (shortness of breath)     Past Surgical History:  Procedure Laterality  Date   BIV UPGRADE N/A 10/07/2021   Procedure: BIV PPM UPGRADE;  Surgeon: Constance Haw, MD;  Location: [facility];  Service: Cardiovascular;  Laterality: N/A;   LEFT HEART CATH AND CORONARY ANGIOGRAPHY N/A 04/19/2017   Procedure: Left Heart Cath and Coronary Angiography;  Surgeon: Belva Crome, MD;  Location: [facility];  Service: Cardiovascular;  Laterality: N/A;   PACEMAKER IMPLANT N/A 04/16/2021   Procedure: PACEMAKER IMPLANT;  Surgeon: Constance Haw, MD;  Location: [facility];  Service: Cardiovascular;  Laterality: N/A;   RIGHT/LEFT HEART CATH AND CORONARY ANGIOGRAPHY N/A 08/22/2021   Procedure: RIGHT/LEFT HEART CATH AND CORONARY ANGIOGRAPHY;  Surgeon: Jolaine Artist, MD;  Location: [facility];  Service: Cardiovascular;  Laterality: N/A;   TONSILLECTOMY      MEDICATIONS: No current facility-administered medications for this encounter.    ADVAIR DISKUS 250-50 MCG/ACT AEPB   albuterol (PROVENTIL) (2.5 MG/3ML) 0.083% nebulizer solution   albuterol (VENTOLIN HFA) 108 (90 Base) MCG/ACT inhaler   allopurinol (ZYLOPRIM) 300 MG tablet   ALPRAZolam (XANAX) 0.25 MG tablet   AMBULATORY NON FORMULARY MEDICATION   aspirin EC 81 MG tablet   Aspirin-Salicylamide-Caffeine (ARTHRITIS STRENGTH BC POWDER PO)   cholecalciferol (VITAMIN D3) 25 MCG (1000 UNIT) tablet   colchicine 0.6 MG tablet   DALIRESP 500 MCG TABS tablet   EPINEPHrine 0.3 mg/0.3 mL IJ SOAJ injection   fluticasone (FLONASE) 50 MCG/ACT nasal spray   fluticasone-salmeterol (ADVAIR HFA) 115-21 MCG/ACT inhaler   furosemide (LASIX) 40 MG tablet   Glucosamine HCl (GLUCOSAMINE PO)   guaiFENesin (MUCINEX) 600 MG 12 hr tablet   ipratropium (ATROVENT) 0.06 % nasal spray   Lidocaine, Anorectal, 5 % CREA   losartan (COZAAR) 25 MG tablet   mometasone-formoterol (DULERA) 100-5 MCG/ACT AERO   montelukast (SINGULAIR) 10 MG tablet   nicotine (NICODERM CQ - DOSED IN MG/24 HOURS) 14 mg/24hr patch    nicotine (NICODERM CQ - DOSED IN MG/24 HOURS) 21 mg/24hr patch   omeprazole (PRILOSEC) 20 MG capsule   polyethylene glycol (MIRALAX / GLYCOLAX) 17 g packet   potassium chloride SA (KLOR-CON M20) 20 MEQ tablet   predniSONE (DELTASONE) 10 MG tablet   vitamin B-12 (CYANOCOBALAMIN) 1000 MCG tablet    Myra Gianotti, PA-C Surgical Short Stay/Anesthesiology [facility] Phone [phone] [facility] Phone [phone] 01/28/2022 12:30 PM

## 2022-01-29 ENCOUNTER — Encounter (HOSPITAL_COMMUNITY): Payer: Self-pay | Admitting: Surgery

## 2022-01-29 ENCOUNTER — Other Ambulatory Visit: Payer: Self-pay

## 2022-01-29 ENCOUNTER — Ambulatory Visit (HOSPITAL_COMMUNITY): Payer: 59 | Admitting: Vascular Surgery

## 2022-01-29 ENCOUNTER — Encounter (HOSPITAL_COMMUNITY)
Admission: RE | Disposition: A | Discharge status: Home / Self Care | Payer: Self-pay | Source: Ambulatory Visit | Attending: Surgery

## 2022-01-29 ENCOUNTER — Ambulatory Visit (HOSPITAL_COMMUNITY)
Admission: RE | Admit: 2022-01-29 | Discharge: 2022-01-29 | Disposition: A | Discharge status: Home / Self Care | Payer: 59 | Source: Ambulatory Visit | Attending: Surgery | Admitting: Surgery

## 2022-01-29 ENCOUNTER — Ambulatory Visit (HOSPITAL_BASED_OUTPATIENT_CLINIC_OR_DEPARTMENT_OTHER): Payer: 59 | Admitting: Vascular Surgery

## 2022-01-29 DIAGNOSIS — I5022 Chronic systolic (congestive) heart failure: Secondary | ICD-10-CM | POA: Diagnosis not present

## 2022-01-29 DIAGNOSIS — Z79899 Other long term (current) drug therapy: Secondary | ICD-10-CM | POA: Insufficient documentation

## 2022-01-29 DIAGNOSIS — I110 Hypertensive heart disease with heart failure: Secondary | ICD-10-CM

## 2022-01-29 DIAGNOSIS — Z87891 Personal history of nicotine dependence: Secondary | ICD-10-CM | POA: Diagnosis not present

## 2022-01-29 DIAGNOSIS — K219 Gastro-esophageal reflux disease without esophagitis: Secondary | ICD-10-CM | POA: Diagnosis not present

## 2022-01-29 DIAGNOSIS — Z9981 Dependence on supplemental oxygen: Secondary | ICD-10-CM | POA: Diagnosis not present

## 2022-01-29 DIAGNOSIS — I428 Other cardiomyopathies: Secondary | ICD-10-CM | POA: Diagnosis not present

## 2022-01-29 DIAGNOSIS — Z950 Presence of cardiac pacemaker: Secondary | ICD-10-CM | POA: Diagnosis not present

## 2022-01-29 DIAGNOSIS — I480 Paroxysmal atrial fibrillation: Secondary | ICD-10-CM | POA: Insufficient documentation

## 2022-01-29 DIAGNOSIS — Z7951 Long term (current) use of inhaled steroids: Secondary | ICD-10-CM | POA: Insufficient documentation

## 2022-01-29 DIAGNOSIS — E785 Hyperlipidemia, unspecified: Secondary | ICD-10-CM | POA: Diagnosis not present

## 2022-01-29 DIAGNOSIS — Z7952 Long term (current) use of systemic steroids: Secondary | ICD-10-CM | POA: Diagnosis not present

## 2022-01-29 DIAGNOSIS — J439 Emphysema, unspecified: Secondary | ICD-10-CM | POA: Insufficient documentation

## 2022-01-29 DIAGNOSIS — G4730 Sleep apnea, unspecified: Secondary | ICD-10-CM | POA: Diagnosis not present

## 2022-01-29 DIAGNOSIS — G4733 Obstructive sleep apnea (adult) (pediatric): Secondary | ICD-10-CM | POA: Diagnosis not present

## 2022-01-29 HISTORY — DX: Personal history of other diseases of the digestive system: Z87.19

## 2022-01-29 HISTORY — DX: Unspecified osteoarthritis, unspecified site: M19.90

## 2022-01-29 HISTORY — DX: Presence of cardiac pacemaker: Z95.0

## 2022-01-29 HISTORY — DX: Dyspnea, unspecified: R06.00

## 2022-01-29 LAB — COMPREHENSIVE METABOLIC PANEL
ALT: 22 U/L (ref 0–44)
AST: 21 U/L (ref 15–41)
Albumin: 3.9 g/dL (ref 3.5–5.0)
Alkaline Phosphatase: 42 U/L (ref 38–126)
Anion gap: 10 (ref 5–15)
BUN: 19 mg/dL (ref 8–23)
CO2: 25 mmol/L (ref 22–32)
Calcium: 9.5 mg/dL (ref 8.9–10.3)
Chloride: 105 mmol/L (ref 98–111)
Creatinine, Ser: 0.8 mg/dL (ref 0.61–1.24)
GFR, Estimated: >60 mL/min (ref >60)
Glucose, Bld: 113 mg/dL — ABNORMAL HIGH (ref 70–99)
Potassium: 3.9 mmol/L (ref 3.5–5.1)
Sodium: 140 mmol/L (ref 135–145)
Total Bilirubin: 0.7 mg/dL (ref 0.3–1.2)
Total Protein: 6.4 g/dL — ABNORMAL LOW (ref 6.5–8.1)

## 2022-01-29 LAB — SURGICAL PCR SCREEN
MRSA, PCR: NEGATIVE
Staphylococcus aureus: NEGATIVE

## 2022-01-29 LAB — PROTIME-INR
INR: 0.9 (ref 0.8–1.2)
Prothrombin Time: 11.7 seconds (ref 11.4–15.2)

## 2022-01-29 LAB — TYPE AND SCREEN
ABO/RH(D): A POS
Antibody Screen: NEGATIVE

## 2022-01-29 LAB — CBC
HCT: 45.3 % (ref 39.0–52.0)
Hemoglobin: 15.7 g/dL (ref 13.0–17.0)
MCH: 33.2 pg (ref 26.0–34.0)
MCHC: 34.7 g/dL (ref 30.0–36.0)
MCV: 95.8 fL (ref 80.0–100.0)
Platelets: 194 10*3/uL (ref 150–400)
RBC: 4.73 MIL/uL (ref 4.22–5.81)
RDW: 13.2 % (ref 11.5–15.5)
WBC: 6.5 10*3/uL (ref 4.0–10.5)
nRBC: 0 % (ref 0.0–0.2)

## 2022-01-29 LAB — ABO/RH: ABO/RH(D): A POS

## 2022-01-29 LAB — APTT: aPTT: 26 seconds (ref 24–36)

## 2022-01-29 LAB — SARS CORONAVIRUS 2 (TAT 6-24 HRS): SARS Coronavirus 2: NEGATIVE

## 2022-01-29 SURGERY — INSERTION, CAROTID SINUS BAROREFLEX ACTIVATION DEVICE
Anesthesia: General | Laterality: Right

## 2022-01-29 MED ORDER — 0.9 % SODIUM CHLORIDE (POUR BTL) OPTIME
TOPICAL | Status: DC | PRN
  Administered 2022-01-29: 1000 mL

## 2022-01-29 MED ORDER — ETOMIDATE 2 MG/ML IV SOLN
INTRAVENOUS | Status: AC
  Filled 2022-01-29: qty 10

## 2022-01-29 MED ORDER — CHLORHEXIDINE GLUCONATE CLOTH 2 % EX PADS: 6.0000 | MEDICATED_PAD | Freq: Once | CUTANEOUS | Status: DC

## 2022-01-29 MED ORDER — FENTANYL CITRATE (PF) 250 MCG/5ML IJ SOLN
INTRAMUSCULAR | Status: DC | PRN
Start: 2022-01-29 — End: 2022-01-29
  Administered 2022-01-29 (×2): 100 ug via INTRAVENOUS
  Administered 2022-01-29: 50 ug via INTRAVENOUS

## 2022-01-29 MED ORDER — VANCOMYCIN HCL IN DEXTROSE 1-5 GM/200ML-% IV SOLN
INTRAVENOUS | Status: AC
  Filled 2022-01-29: qty 200

## 2022-01-29 MED ORDER — BUPIVACAINE HCL (PF) 0.5 % IJ SOLN
INTRAMUSCULAR | Status: AC
  Filled 2022-01-29: qty 30

## 2022-01-29 MED ORDER — GLYCOPYRROLATE PF 0.2 MG/ML IJ SOSY
PREFILLED_SYRINGE | INTRAMUSCULAR | Status: AC
  Filled 2022-01-29: qty 1

## 2022-01-29 MED ORDER — VANCOMYCIN HCL 1000 MG IV SOLR
INTRAVENOUS | Status: DC
  Filled 2022-01-29: qty 20

## 2022-01-29 MED ORDER — ROCURONIUM 10MG/ML (10ML) SYRINGE FOR MEDFUSION PUMP - OPTIME
INTRAVENOUS | Status: DC | PRN
  Administered 2022-01-29: 50 mg via INTRAVENOUS
  Administered 2022-01-29: 100 mg via INTRAVENOUS

## 2022-01-29 MED ORDER — FENTANYL CITRATE (PF) 100 MCG/2ML IJ SOLN
25.0000 ug | INTRAMUSCULAR | Status: DC | PRN
  Administered 2022-01-29: 50 ug via INTRAVENOUS

## 2022-01-29 MED ORDER — PROPOFOL 10 MG/ML IV BOLUS: INTRAVENOUS | Status: DC | PRN

## 2022-01-29 MED ORDER — LIDOCAINE HCL (PF) 1 % IJ SOLN
INTRAMUSCULAR | Status: AC
  Filled 2022-01-29: qty 30

## 2022-01-29 MED ORDER — FENTANYL CITRATE (PF) 250 MCG/5ML IJ SOLN
INTRAMUSCULAR | Status: AC
  Filled 2022-01-29: qty 5

## 2022-01-29 MED ORDER — ONDANSETRON HCL 4 MG/2ML IJ SOLN
INTRAMUSCULAR | Status: DC | PRN
Start: 2022-01-29 — End: 2022-01-29
  Administered 2022-01-29: 4 mg via INTRAVENOUS

## 2022-01-29 MED ORDER — ONDANSETRON HCL 4 MG/2ML IJ SOLN: 4.0000 mg | Freq: Once | INTRAMUSCULAR | Status: DC | PRN

## 2022-01-29 MED ORDER — PROPOFOL 10 MG/ML IV BOLUS
INTRAVENOUS | Status: AC
  Filled 2022-01-29: qty 20

## 2022-01-29 MED ORDER — LIDOCAINE 2% (20 MG/ML) 5 ML SYRINGE
INTRAMUSCULAR | Status: AC
  Filled 2022-01-29: qty 5

## 2022-01-29 MED ORDER — ONDANSETRON HCL 4 MG/2ML IJ SOLN
INTRAMUSCULAR | Status: AC
  Filled 2022-01-29: qty 2

## 2022-01-29 MED ORDER — OXYCODONE-ACETAMINOPHEN 5-325 MG PO TABS
1.0000 | ORAL_TABLET | Freq: Four times a day (QID) | ORAL | 0 refills | Status: DC | PRN
Start: 2022-01-29 — End: 2022-02-17

## 2022-01-29 MED ORDER — LIDOCAINE HCL (CARDIAC) PF 100 MG/5ML IV SOSY
PREFILLED_SYRINGE | INTRAVENOUS | Status: DC | PRN
  Administered 2022-01-29: 100 mg via INTRAVENOUS

## 2022-01-29 MED ORDER — DEXAMETHASONE SODIUM PHOSPHATE 10 MG/ML IJ SOLN
INTRAMUSCULAR | Status: DC | PRN
  Administered 2022-01-29: 10 mg via INTRAVENOUS

## 2022-01-29 MED ORDER — DEXAMETHASONE SODIUM PHOSPHATE 10 MG/ML IJ SOLN
INTRAMUSCULAR | Status: AC
  Filled 2022-01-29: qty 1

## 2022-01-29 MED ORDER — CHLORHEXIDINE GLUCONATE 0.12 % MT SOLN
OROMUCOSAL | Status: AC
  Administered 2022-01-29: 15 mL
  Filled 2022-01-29: qty 15

## 2022-01-29 MED ORDER — ROCURONIUM BROMIDE 10 MG/ML (PF) SYRINGE
PREFILLED_SYRINGE | INTRAVENOUS | Status: AC
  Filled 2022-01-29: qty 20

## 2022-01-29 MED ORDER — LACTATED RINGERS IV SOLN: INTRAVENOUS | Status: DC | PRN

## 2022-01-29 MED ORDER — SODIUM CHLORIDE 0.9 % IV SOLN: INTRAVENOUS | Status: DC

## 2022-01-29 MED ORDER — MIDAZOLAM HCL 2 MG/2ML IJ SOLN
INTRAMUSCULAR | Status: AC
  Filled 2022-01-29: qty 2

## 2022-01-29 MED ORDER — MIDAZOLAM HCL 2 MG/2ML IJ SOLN
INTRAMUSCULAR | Status: DC | PRN
Start: 2022-01-29 — End: 2022-01-29
  Administered 2022-01-29: 2 mg via INTRAVENOUS

## 2022-01-29 MED ORDER — GLYCOPYRROLATE 0.2 MG/ML IJ SOLN
INTRAMUSCULAR | Status: DC | PRN
  Administered 2022-01-29: .2 mg via INTRAVENOUS

## 2022-01-29 MED ORDER — ETOMIDATE 2 MG/ML IV SOLN
INTRAVENOUS | Status: DC | PRN
  Administered 2022-01-29: 20 mg via INTRAVENOUS

## 2022-01-29 MED ORDER — FENTANYL CITRATE (PF) 100 MCG/2ML IJ SOLN
INTRAMUSCULAR | Status: AC
  Filled 2022-01-29: qty 2

## 2022-01-29 MED ORDER — ACETAMINOPHEN 500 MG PO TABS
1000.0000 mg | ORAL_TABLET | Freq: Once | ORAL | Status: AC
  Administered 2022-01-29: 1000 mg via ORAL
  Filled 2022-01-29: qty 2

## 2022-01-29 MED ORDER — SUGAMMADEX SODIUM 200 MG/2ML IV SOLN
INTRAVENOUS | Status: DC | PRN
  Administered 2022-01-29: 200 mg via INTRAVENOUS

## 2022-01-29 MED ORDER — VANCOMYCIN HCL 1000 MG IV SOLR
INTRAVENOUS | Status: DC | PRN
  Administered 2022-01-29: 1000 mL

## 2022-01-29 SURGICAL SUPPLY — 44 items
ADH SKN CLS APL DERMABOND .7 (GAUZE/BANDAGES/DRESSINGS) ×1
APL PRP STRL LF DISP 70% ISPRP (MISCELLANEOUS) ×1
BAG COUNTER SPONGE SURGICOUNT (BAG) ×2 IMPLANT
BAG SPNG CNTER NS LX DISP (BAG) ×1
CANISTER SUCT 3000ML PPV (MISCELLANEOUS) ×2 IMPLANT
CHLORAPREP W/TINT 26 (MISCELLANEOUS) ×2 IMPLANT
CLIP TI WIDE RED SMALL 24 (CLIP) ×1 IMPLANT
CLIP VESOCCLUDE MED 6/CT (CLIP) IMPLANT
CLIP VESOCCLUDE SM WIDE 6/CT (CLIP) IMPLANT
DERMABOND ADVANCED (GAUZE/BANDAGES/DRESSINGS) ×1
DERMABOND ADVANCED .7 DNX12 (GAUZE/BANDAGES/DRESSINGS) ×1 IMPLANT
ELECT REM PT RETURN 9FT ADLT (ELECTROSURGICAL) ×2
ELECTRODE REM PT RTRN 9FT ADLT (ELECTROSURGICAL) ×1 IMPLANT
GENERATOR IPG BAROSTIM 2104 (Generator) ×1 IMPLANT
GLOVE SRG 8 PF TXTR STRL LF DI (GLOVE) ×1 IMPLANT
GLOVE SURG POLYISO LF SZ7.5 (GLOVE) ×2 IMPLANT
GLOVE SURG UNDER POLY LF SZ8 (GLOVE) ×2
GOWN STRL REUS W/ TWL LRG LVL3 (GOWN DISPOSABLE) ×3 IMPLANT
GOWN STRL REUS W/ TWL XL LVL3 (GOWN DISPOSABLE) ×1 IMPLANT
GOWN STRL REUS W/TWL LRG LVL3 (GOWN DISPOSABLE) ×6
GOWN STRL REUS W/TWL XL LVL3 (GOWN DISPOSABLE) ×2
HEMOSTAT SNOW SURGICEL 2X4 (HEMOSTASIS) IMPLANT
KIT BASIN OR (CUSTOM PROCEDURE TRAY) ×2 IMPLANT
KIT TURNOVER KIT B (KITS) ×2 IMPLANT
LEAD CAROTID BAROSTIM 1036 (Lead) ×1 IMPLANT
MARKER SKIN DUAL TIP RULER LAB (MISCELLANEOUS) ×2 IMPLANT
NDL 18GX1X1/2 (RX/OR ONLY) (NEEDLE) ×1 IMPLANT
NEEDLE 18GX1X1/2 (RX/OR ONLY) (NEEDLE) ×2 IMPLANT
NS IRRIG 1000ML POUR BTL (IV SOLUTION) ×4 IMPLANT
PACK CAROTID (CUSTOM PROCEDURE TRAY) ×2 IMPLANT
PAD ARMBOARD 7.5X6 YLW CONV (MISCELLANEOUS) ×4 IMPLANT
POSITIONER HEAD DONUT 9IN (MISCELLANEOUS) ×2 IMPLANT
SPONGE T-LAP 18X18 ~~LOC~~+RFID (SPONGE) ×1 IMPLANT
SUT ETHIBOND CT1 BRD #0 30IN (SUTURE) ×4 IMPLANT
SUT ETHILON 3 0 PS 1 (SUTURE) IMPLANT
SUT PROLENE 6 0 BV (SUTURE) ×16 IMPLANT
SUT SILK 0 FSL (SUTURE) IMPLANT
SUT VIC AB 3-0 SH 27 (SUTURE) ×4
SUT VIC AB 3-0 SH 27X BRD (SUTURE) ×2 IMPLANT
SUT VICRYL 4-0 PS2 18IN ABS (SUTURE) ×4 IMPLANT
SYR 5ML LL (SYRINGE) ×2 IMPLANT
SYR BULB IRRIG 60ML STRL (SYRINGE) ×3 IMPLANT
TOWEL GREEN STERILE (TOWEL DISPOSABLE) ×2 IMPLANT
WATER STERILE IRR 1000ML POUR (IV SOLUTION) ×2 IMPLANT

## 2022-01-29 NOTE — Op Note (Signed)
° ° °  Patient name: Robert Church MRN: [id number] DOB: Mar 13, 1956 Sex: male  01/29/2022 Pre-operative Diagnosis: NYHA class III heart failure Post-operative diagnosis:  Same Surgeon:  [name] Assistants:  Ivin Booty, PA Procedure:   Insertion of right sided Barostim device Anesthesia:  [place] Blood Loss:  minimal Specimens:  none  Findings: Device at position A Serial #[id number], model 01/21/2003 Serial #[id number], model #1036 No device interaction  Indications: This is a 66 year old gentleman with NYHA class III heart failure who remains symptomatic despite goal-directed medical therapy.  He has been approved for Barostim implant.  Procedure:  The patient was identified in the holding area and taken to [facility] [address]  The patient was then placed supine on the table. general anesthesia was administered.  The patient was prepped and draped in the usual sterile fashion.  A time out was called and antibiotics were administered.  A PA was necessary to expedite procedure and assist with technical details.  Ultrasound was used to identify the carotid bifurcation and the right neck.  A 4 cm transverse incision was made at this level.  Cautery was used divide subcutaneous tissue and platysma muscle.  The patient had a large amount of subcutaneous tissue.  I used wheat Lander's to facilitate retraction.  I identified the facial vein and ligated this between silk ties.  I then dissected out the common carotid artery and the bifurcation.  Once I had adequate exposure, attention was turned towards the pocket.  A infraclavicular incision was made.  Cautery was used divide subcutaneous tissue down to the pectoral fascia.  I then bluntly created the generator pocket.  Using a tonsil clamp, I tunneled between the 2 incisions.  The device was then brought through the tunnel and connected to the generator which was placed in the pocket.  We then began hemodynamic testing.  The electrode was placed at position a.   We immediately had a good blood pressure response.  Two 6-0 Prolene sutures were used to place the electrode at position a and we repeated testing which again generated a good response.  I then placed an additional four 6-0 Prolene sutures to secure the electrode.  Both incisions were irrigated with antibiotic solution.  The generator was then placed in the pocket and the anchoring Ethibond sutures were secured to the pectoral fascia.  The wounds were irrigated.  The neck incision was closed by reapproximating the platysma muscle with 3-0 Vicryl and the skin with 4-0 Vicryl.  The generator incision was closed by reapproximating the subcutaneous tissue with 3-0 Vicryl followed by 4-0 Vicryl on the skin.  Dermabond was applied.  There were no immediate complications.   Disposition: To PACU stable.   Theotis Burrow, M.D., [facility] [place] Office: [phone] Pager:  [phone]

## 2022-01-29 NOTE — Interval H&P Note (Signed)
History and Physical Interval Note:  01/29/2022 7:29 AM  Robert Church  has presented today for surgery, with the diagnosis of Chronic systolic congestive heart failure, NYHA class 3.  The various methods of treatment have been discussed with the patient and family. After consideration of risks, benefits and other options for treatment, the patient has consented to  Procedure(s): INSERTION OF RIGHT BAROSTIM (Right) as a surgical intervention.  The patient's history has been reviewed, patient examined, no change in status, stable for surgery.  I have reviewed the patient's chart and labs.  Questions were answered to the patient's satisfaction.     [name]

## 2022-01-29 NOTE — Anesthesia Postprocedure Evaluation (Signed)
Anesthesia Post Note  Patient: Robert Church  Procedure(s) Performed: INSERTION OF RIGHT BAROSTIM (Right)     Patient location during evaluation: PACU Anesthesia Type: General Level of consciousness: awake and alert Pain management: pain level controlled Vital Signs Assessment: post-procedure vital signs reviewed and stable Respiratory status: spontaneous breathing, nonlabored ventilation, respiratory function stable and patient connected to nasal cannula oxygen Cardiovascular status: blood pressure returned to baseline and stable Postop Assessment: no apparent nausea or vomiting Anesthetic complications: no   No notable events documented.  Last Vitals:  Vitals:   01/29/22 1000 01/29/22 1012  BP: (!) 128/91 (!) 135/94  Pulse: 83 84  Resp: 15 18  Temp:    SpO2: 91% 92%    Last Pain:  Vitals:   01/29/22 1012  TempSrc:   PainSc: [place]

## 2022-01-29 NOTE — Transfer of Care (Signed)
Immediate Anesthesia Transfer of Care Note  Patient: Robert Church  Procedure(s) Performed: INSERTION OF RIGHT BAROSTIM (Right)  Patient Location: PACU  Anesthesia Type:General  Level of Consciousness: awake, alert , oriented and patient cooperative  Airway & Oxygen Therapy: Patient Spontanous Breathing and Patient connected to nasal cannula oxygen  Post-op Assessment: Report given to RN, Post -op Vital signs reviewed and stable and Patient moving all extremities X 4  Post vital signs: Reviewed and stable  Last Vitals:  Vitals Value Taken Time  BP 137/101 01/29/22 0945  Temp    Pulse 86 01/29/22 0949  Resp 20 01/29/22 0949  SpO2 90 % 01/29/22 0949  Vitals shown include unvalidated device data.  Last Pain:  Vitals:   01/29/22 0645  TempSrc: Oral         Complications: No notable events documented.

## 2022-01-29 NOTE — Progress Notes (Signed)
Wasted 1 cc fentanyl with witness Mcneil Sober, Therapist, sports .

## 2022-01-29 NOTE — Anesthesia Procedure Notes (Signed)
Procedure Name: Intubation Date/Time: 01/29/2022 7:55 AM Performed by: Claris Che, CRNA Pre-anesthesia Checklist: Patient identified, Emergency Drugs available, Suction available, Patient being monitored and Timeout performed Patient Re-evaluated:Patient Re-evaluated prior to induction Oxygen Delivery Method: Circle system utilized Preoxygenation: Pre-oxygenation with 100% oxygen Induction Type: IV induction and Cricoid Pressure applied Ventilation: Mask ventilation without difficulty Laryngoscope Size: Mac and 4 Grade View: Grade III Tube type: Oral Tube size: 8.0 mm Number of attempts: 1 Airway Equipment and Method: Stylet Placement Confirmation: ETT inserted through vocal cords under direct vision, positive ETCO2 and breath sounds checked- equal and bilateral Secured at: 23 cm Tube secured with: Tape Dental Injury: Teeth and Oropharynx as per pre-operative assessment

## 2022-01-29 NOTE — Anesthesia Procedure Notes (Signed)
Arterial Line Insertion Start/End2/08/2022 7:15 AM, 01/29/2022 7:15 AM Performed by: Kyung Rudd, CRNA, CRNA  Preanesthetic checklist: patient identified, IV checked, risks and benefits discussed, surgical consent and pre-op evaluation Lidocaine 1% used for infiltration Right, radial was placed Catheter size: 20 G Hand hygiene performed  and maximum sterile barriers used   Attempts: 1 Procedure performed without using ultrasound guided technique. Following insertion, dressing applied and Biopatch. Post procedure assessment: normal  Patient tolerated the procedure well with no immediate complications.

## 2022-01-30 ENCOUNTER — Other Ambulatory Visit: Payer: Self-pay

## 2022-01-30 ENCOUNTER — Ambulatory Visit (INDEPENDENT_AMBULATORY_CARE_PROVIDER_SITE_OTHER): Payer: 59 | Admitting: Vascular Surgery

## 2022-01-30 ENCOUNTER — Encounter: Payer: Self-pay | Admitting: Vascular Surgery

## 2022-01-30 ENCOUNTER — Telehealth: Payer: Self-pay

## 2022-01-30 VITALS — BP 133/85 | HR 77 | Temp 97.8°F | Resp 16 | Ht 70.0 in | Wt 230.0 lb

## 2022-01-30 DIAGNOSIS — R51 Headache with orthostatic component, not elsewhere classified: Secondary | ICD-10-CM

## 2022-01-30 NOTE — Telephone Encounter (Signed)
Pt called with c/o blurred vision that he noticed upon waking at 4am and has since improved a little. He is also c/o R sided drooping of his mouth. He has no other symptoms or concerns at this time. MD is aware and has added on to his schedule today in clinic. Pt is aware of this appt.

## 2022-01-30 NOTE — Progress Notes (Signed)
°  Progress Note  S/p: Right-sided Baro Stim placement on 01/29/2022  Subjective: Patient woke up in middle night with a pounding headache, blurry vision, dizziness.  Denied sensorimotor deficits, denied monocular blindness.  It took the majority of the morning for his headache to resolve.   On presentation to clinic, his headache had ceased after taking a BC powder.  He continued to have mild blurry vision, but stated it was improving.  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 Overnight and morning symptoms appear to be likely from hypotension.  No signs or symptoms of stroke.  Headache has resolved vision improving.  I asked the patient to continue to monitor his symptoms, should they worsen he should call our surgeon on-call, and likely present to the ED.      Cassandria Santee MD MS [facility] [phone] 01/30/2022  3:51 PM

## 2022-02-02 ENCOUNTER — Telehealth: Payer: Self-pay | Admitting: Internal Medicine

## 2022-02-02 DIAGNOSIS — M1A0790 Idiopathic chronic gout, unspecified ankle and foot, without tophus (tophi): Secondary | ICD-10-CM

## 2022-02-02 MED ORDER — ALLOPURINOL 300 MG PO TABS: 300.0000 mg | ORAL_TABLET | Freq: Every day | ORAL | 0 refills | Status: DC

## 2022-02-02 NOTE — Telephone Encounter (Signed)
1.Medication Requested: allopurinol (ZYLOPRIM) 300 MG tablet  2. Pharmacy (Name, [place]): [facility] [place], [place] [facility] [place]  Phone:  [phone] Fax:  [phone]   3. On Med List: yes  4. Last Visit with PCP: 10.31.22  5. Next visit date with PCP: 04.19.23   Agent: Please be advised that RX refills may take up to 3 business days. We ask that you follow-up with your pharmacy.

## 2022-02-03 ENCOUNTER — Telehealth: Payer: Self-pay

## 2022-02-03 ENCOUNTER — Telehealth: Payer: Self-pay | Admitting: Emergency Medicine

## 2022-02-03 NOTE — Telephone Encounter (Signed)
Pt is requesting a refill on: colchicine 0.6 MG tablet  Pt states the current Rx he has is expired and is currently having a flare up.  Pharmacy: [facility] [place], [place] 10/20/21  Pt CB [phone]

## 2022-02-03 NOTE — Telephone Encounter (Signed)
Called and spoke to pt. Pt states he has been having worsening fatigue and shortness of breath since Jan 2023. Pt states he just recently had a Barostim implant and feels dizzy since the procedure. Pt was seen by vasc sugery on 2/10 and states this was mentioned at visit. Pt states he has prod cough with clear mucus, this is baseline for pt. Pt was advised to come back in 05/2022 but pt feels he needs a sooner appt. Appt scheduled with Rexene Edison, NP, on 2/21. Pt aware to call back sooner or seek emergency care if s/s worsen.

## 2022-02-09 ENCOUNTER — Ambulatory Visit (INDEPENDENT_AMBULATORY_CARE_PROVIDER_SITE_OTHER): Payer: 59 | Admitting: Surgery

## 2022-02-09 ENCOUNTER — Other Ambulatory Visit: Payer: Self-pay

## 2022-02-09 ENCOUNTER — Encounter: Payer: Self-pay | Admitting: Surgery

## 2022-02-09 VITALS — BP 111/60 | HR 95 | Temp 97.9°F | Resp 20 | Ht 70.0 in | Wt 230.0 lb

## 2022-02-09 DIAGNOSIS — I5022 Chronic systolic (congestive) heart failure: Secondary | ICD-10-CM

## 2022-02-09 NOTE — Progress Notes (Signed)
Patient name: Robert Church MRN: [id number] DOB: June 12, 1956 Sex: male  REASON FOR VISIT:     Post op  HISTORY OF PRESENT ILLNESS:   Robert Church is a 66 y.o. male who is status post Barostim implant on 01/29/2022.  The night of surgery he woke up with a severe headache blurry vision and dizziness.  He was evaluated in the office.  The headache had resolved after taking a BC powder.  He did have some mild blurry vision but it was improving.  It was felt that his symptoms were likely from hypotension.  He is back today for follow-up.  CURRENT MEDICATIONS:    Current Outpatient Medications  Medication Sig Dispense Refill   ADVAIR DISKUS 250-50 MCG/ACT AEPB INHALE 1 PUFF INTO THE LUNGS EVERY 12 HOURS (Patient taking differently: 1 puff 2 (two) times daily as needed (asthma).) 60 each 11   albuterol (PROVENTIL) (2.5 MG/3ML) 0.083% nebulizer solution Take 3 mLs (2.5 mg total) by nebulization every 4 (four) hours as needed for wheezing or shortness of breath. 75 mL 11   albuterol (VENTOLIN HFA) 108 (90 Base) MCG/ACT inhaler INHALE 2 PUFFS INTO THE LUNGS EVERY 6 HOURS AS NEEDED FOR WHEEZING 18 g 2   allopurinol (ZYLOPRIM) 300 MG tablet Take 1 tablet (300 mg total) by mouth daily. 90 tablet 0   ALPRAZolam (XANAX) 0.25 MG tablet Take 1 tablet (0.25 mg total) by mouth 3 (three) times daily. 90 tablet 2   AMBULATORY NON FORMULARY MEDICATION Medication Name: Nitroglycerin gel 0.125% apply 2-3 times daily to the anal canal. (Patient taking differently: Place 1 application rectally in the morning, at noon, and at bedtime. Medication Name: Nitroglycerin gel 0.125% apply 2-3 times daily to the anal canal.) 30 g 1   aspirin EC 81 MG tablet Take 81 mg by mouth daily. Swallow whole.     Aspirin-Salicylamide-Caffeine (ARTHRITIS STRENGTH BC POWDER PO) Take 0.5 packets by mouth daily.     cholecalciferol (VITAMIN D3) 25 MCG (1000 UNIT) tablet Take 1,000 Units by mouth daily.      colchicine 0.6 MG tablet Take 1 tablet (0.6 mg total) by mouth 2 (two) times daily. (Patient taking differently: Take 0.6 mg by mouth 2 (two) times daily as needed (gout).) 180 tablet 0   DALIRESP 500 MCG TABS tablet Take 0.5 tablets (250 mcg total) by mouth 2 (two) times daily. 30 tablet 5   EPINEPHrine 0.3 mg/0.3 mL IJ SOAJ injection Inject 0.3 mg into the muscle as needed for anaphylaxis.     fluticasone (FLONASE) 50 MCG/ACT nasal spray Place 1 spray into both nostrils daily.     fluticasone-salmeterol (ADVAIR HFA) 115-21 MCG/ACT inhaler Inhale 2 puffs into the lungs 2 (two) times daily. 12 g 3   furosemide (LASIX) 40 MG tablet Take 1.5 tablets (60 mg total) by mouth daily. 45 tablet 6   Glucosamine HCl (GLUCOSAMINE PO) Take 1 tablet by mouth daily.     guaiFENesin (MUCINEX) 600 MG 12 hr tablet Take 1 tablet (600 mg total) by mouth 2 (two) times daily. 60 tablet 5   ipratropium (ATROVENT) 0.06 % nasal spray Place 2 sprays into both nostrils 4 (four) times daily. 15 mL 3   Lidocaine, Anorectal, 5 % CREA Apply 1 application topically 2 (two) times daily. 30 g 1   losartan (COZAAR) 25 MG tablet Take 0.5 tablets (12.5 mg total) by mouth at bedtime. 15 tablet 3   mometasone-formoterol (DULERA) 100-5 MCG/ACT AERO Inhale 2 puffs into the lungs  2 (two) times daily.     montelukast (SINGULAIR) 10 MG tablet Take 10 mg by mouth at bedtime.     nicotine (NICODERM CQ - DOSED IN MG/24 HOURS) 14 mg/24hr patch Place 1 patch (14 mg total) onto the skin daily. 28 patch 0   nicotine (NICODERM CQ - DOSED IN MG/24 HOURS) 21 mg/24hr patch Place 21 mg onto the skin daily as needed.     omeprazole (PRILOSEC) 20 MG capsule Take 1 capsule (20 mg total) by mouth 2 (two) times daily before a meal. ACID REFLUX 180 capsule 1   oxyCODONE-acetaminophen (PERCOCET) 5-325 MG tablet Take 1 tablet by mouth every 6 (six) hours as needed for severe pain. 16 tablet 0   polyethylene glycol (MIRALAX / GLYCOLAX) 17 g packet Take 17 g by  mouth daily as needed. 14 each 0   predniSONE (DELTASONE) 10 MG tablet TAKE 1 TABLET(10 MG) BY MOUTH DAILY WITH BREAKFAST 30 tablet 5   vitamin B-12 (CYANOCOBALAMIN) 1000 MCG tablet Take 1,000 mcg by mouth daily.     potassium chloride SA (KLOR-CON M20) 20 MEQ tablet Take 1 tablet (20 mEq total) by mouth daily. 90 tablet 3   No current facility-administered medications for this visit.    REVIEW OF SYSTEMS:   [X]  denotes positive finding, [ ]  denotes negative finding Cardiac  Comments:  Chest pain or chest pressure:    Shortness of breath upon exertion:    Short of breath when lying flat:    Irregular heart rhythm:    Constitutional    Fever or chills:      PHYSICAL EXAM:   Vitals:   02/09/22 0905  BP: 111/60  Pulse: 95  Resp: 20  Temp: 97.9 F (36.6 C)  SpO2: 94%  Weight: 230 lb (104.3 kg)  Height: 5\' 10"  (1.778 m)    GENERAL: The patient is a well-nourished male, in no acute distress. The vital signs are documented above. CARDIOVASCULAR: There is a regular rate and rhythm. PULMONARY: Non-labored respirations Incisions have healed nicely without erythema or drainage  STUDIES:   None   MEDICAL ISSUES:   Status post right-sided Barostim implant.  He was seen the day after surgery with headaches and blurry vision.  These have essentially resolved.  He will occasionally get a light headache which he will take an Advil for.  Otherwise he is doing well without complaints other than his shortness of breath.  He is scheduled to see Oda Kilts on February 27 for device manipulation.  Leia Alf, MD, FACS [facility] [phone] Pager [phone]

## 2022-02-10 ENCOUNTER — Ambulatory Visit (INDEPENDENT_AMBULATORY_CARE_PROVIDER_SITE_OTHER): Payer: 59 | Admitting: Adult Health

## 2022-02-10 ENCOUNTER — Encounter: Payer: Self-pay | Admitting: Adult Health

## 2022-02-10 ENCOUNTER — Ambulatory Visit (INDEPENDENT_AMBULATORY_CARE_PROVIDER_SITE_OTHER): Payer: 59

## 2022-02-10 VITALS — BP 110/66 | HR 51 | Temp 98.0°F | Ht 70.0 in | Wt 226.0 lb

## 2022-02-10 DIAGNOSIS — J189 Pneumonia, unspecified organism: Secondary | ICD-10-CM

## 2022-02-10 DIAGNOSIS — J449 Chronic obstructive pulmonary disease, unspecified: Secondary | ICD-10-CM | POA: Diagnosis not present

## 2022-02-10 DIAGNOSIS — G4733 Obstructive sleep apnea (adult) (pediatric): Secondary | ICD-10-CM

## 2022-02-10 DIAGNOSIS — J9611 Chronic respiratory failure with hypoxia: Secondary | ICD-10-CM

## 2022-02-10 MED ORDER — SPIRIVA RESPIMAT 2.5 MCG/ACT IN AERS: 2.0000 | INHALATION_SPRAY | Freq: Every day | RESPIRATORY_TRACT | 4 refills | Status: DC

## 2022-02-10 NOTE — Assessment & Plan Note (Signed)
Patient does not need to be taken Advair and Dulera.  Patient will stop Advair.  Continue on Dulera twice daily.  Continue on Spiriva.  Patient is prone to frequent exacerbations.  He has now quit smoking congratulated on smoking cessation. We will continue on low-dose steroids and Daliresp.  Plan  Patient Instructions  Chest xray today  Mucinex DM Twice daily  As needed  cough/congestion  Fluids and rest.  Tylenol As needed   Continue on Dulera 2 puffs Twice daily , rinse after use.  Restart Spiriva daily  Stop Advair.  Continue Daliresp daily .  Continue on Prednisone  10mg  daily.  Albuterol inhaler or neb As needed   Continue on Oxygen 2l/m as needed to keep Oxygen Levels >88-90%.  Continue on Trilogy vent /BIPAP At bedtime  with Oxygen 4l/m  Great job not smoking .  Follow up with [name] or Rena Hunke NP in 3 months w/  PFT and As needed   Please contact office for sooner follow up if symptoms do not improve or worsen or seek emergency care      Tonight

## 2022-02-10 NOTE — Patient Instructions (Addendum)
Chest xray today  Mucinex DM Twice daily  As needed  cough/congestion  Fluids and rest.  Tylenol As needed   Continue on Dulera 2 puffs Twice daily , rinse after use.  Restart Spiriva daily  Stop Advair.  Continue Daliresp daily .  Continue on Prednisone  10mg  daily.  Albuterol inhaler or neb As needed   Continue on Oxygen 2l/m as needed to keep Oxygen Levels >88-90%.  Continue on Trilogy vent /BIPAP At bedtime  with Oxygen 4l/m  Great job not smoking .  Follow up with [name] or Ottis Vacha NP in 3 months w/  PFT and As needed   Please contact office for sooner follow up if symptoms do not improve or worsen or seek emergency care

## 2022-02-10 NOTE — Progress Notes (Signed)
@Patient  ID: Robert Church, male    DOB: June 09, 1956, 66 y.o.   MRN: [id number]  Chief Complaint  Patient presents with   Follow-up    Referring provider: Janith Lima, MD  HPI: 66 year old male active smoker followed for severe COPD with chronic bronchitic phenotype and chronic respiratory failure on oxygen and obstructive sleep apnea on nocturnal Trilogy  Medical history significant for systolic and diastolic congestive heart failure, status post pacemaker  TEST/EVENTS :  Low-dose CT screening CT March 14, 2021 showed lung RADS 2 benign appearance, mild emphysema, mild scarring at the left upper lobe, 4.7 mm right middle lobe nodule and a 5.8 mm right lower lobe nodule unchanged from 2019   Spirometry December 2016 showed moderate restriction with an FEV1 at 69%, ratio 75, FVC 72%, decreased mid flows  02/10/2022 Follow up : COPD , OSA , O2 RF  Patient returns for 78-month follow-up.  Patient has underlying severe COPD.  Patient has recently stopped smoking.  He was congratulated on cessation.  He remains on Advair , Dulera and Spiriva.  We discussed he is not suppose to be taking both Advair and Dulera. He is on chronic steroids with prednisone 10 mg daily  along with Daliresp .  Patient says he gets winded with minimum activities.  Says he has good and bad days.  He denies any increased cough or congestion.  Says his energy level remains low.  He is hoping his recent surgery for congestive heart failure is going to help with his shortness of breath and energy level.  Recently had surgery with implantable Barotism for CHF .   Patient remains on Trilogy noninvasive vent at bedtime.  Download shows 100% compliance with daily average usage at 8 hours.  Says he wears this every single night cannot sleep without it.   Allergies  Allergen Reactions   Spironolactone Anaphylaxis   Clindamycin Other (See Comments)    Tongue swelling   Doxycycline     Severe stomach upset per patient    E-Mycin [Erythromycin Base] Swelling   Jardiance [Empagliflozin] Nausea Only and Other (See Comments)    Lightheadness Dizziness   Penicillins Swelling and Other (See Comments)    Childhood rxn--MD stated he "almost died" Has patient had a PCN reaction causing immediate rash, facial/tongue/throat swelling, SOB or lightheadedness with hypotension:Yes Has patient had a PCN reaction causing severe rash involving mucus membranes or skin necrosis:unsure Has patient had a PCN reaction that required hospitalization:unsure Has patient had a PCN reaction occurring within the last 10 years:No If all of the above answers are "NO", then may proceed with Cephalosporin use.     Rosuvastatin     Myalgias (intolerance)    Immunization History  Administered Date(s) Administered   Influenza,inj,Quad PF,6+ Mos 09/17/2021   PNEUMOCOCCAL CONJUGATE-20 08/20/2021   Tdap 12/03/2011    Past Medical History:  Diagnosis Date   Adenomatous colon polyp    Allergy    Anal fissure    Arthritis    Asthma    Bronchitis    CHF (congestive heart failure) ([place])    COPD, group D, by GOLD 2017 classification ([place])    Dyspnea    Emphysema lung (HCC)    GERD (gastroesophageal reflux disease)    History of hiatal hernia    Hyperlipidemia    Hypertension    NICM (nonischemic cardiomyopathy) (HCC)    OSA (obstructive sleep apnea)    Pneumonia    Presence of permanent cardiac pacemaker  [employer]   Requires supplemental oxygen    Sinusitis    SOB (shortness of breath)     Tobacco History: Social History   Tobacco Use  Smoking Status Former   Packs/day: 2.00   Years: 46.00   Pack years: 92.00   Types: Cigarettes   Quit date: 01/27/2022   Years since quitting: 0.0  Smokeless Tobacco Never  Tobacco Comments   Smoking very little if he even has one in a week.  Does not even want one.   Counseling given: Not Answered Tobacco comments: Smoking very little if he even has one in a week.  Does not even  want one.   Outpatient Medications Prior to Visit  Medication Sig Dispense Refill   albuterol (PROVENTIL) (2.5 MG/3ML) 0.083% nebulizer solution Take 3 mLs (2.5 mg total) by nebulization every 4 (four) hours as needed for wheezing or shortness of breath. 75 mL 11   albuterol (VENTOLIN HFA) 108 (90 Base) MCG/ACT inhaler INHALE 2 PUFFS INTO THE LUNGS EVERY 6 HOURS AS NEEDED FOR WHEEZING 18 g 2   allopurinol (ZYLOPRIM) 300 MG tablet Take 1 tablet (300 mg total) by mouth daily. 90 tablet 0   ALPRAZolam (XANAX) 0.25 MG tablet Take 1 tablet (0.25 mg total) by mouth 3 (three) times daily. 90 tablet 2   AMBULATORY NON FORMULARY MEDICATION Medication Name: Nitroglycerin gel 0.125% apply 2-3 times daily to the anal canal. (Patient taking differently: Place 1 application rectally in the morning, at noon, and at bedtime. Medication Name: Nitroglycerin gel 0.125% apply 2-3 times daily to the anal canal.) 30 g 1   aspirin EC 81 MG tablet Take 81 mg by mouth daily. Swallow whole.     Aspirin-Salicylamide-Caffeine (ARTHRITIS STRENGTH BC POWDER PO) Take 0.5 packets by mouth daily.     cholecalciferol (VITAMIN D3) 25 MCG (1000 UNIT) tablet Take 1,000 Units by mouth daily.     colchicine 0.6 MG tablet Take 1 tablet (0.6 mg total) by mouth 2 (two) times daily. (Patient taking differently: Take 0.6 mg by mouth 2 (two) times daily as needed (gout).) 180 tablet 0   DALIRESP 500 MCG TABS tablet Take 0.5 tablets (250 mcg total) by mouth 2 (two) times daily. 30 tablet 5   EPINEPHrine 0.3 mg/0.3 mL IJ SOAJ injection Inject 0.3 mg into the muscle as needed for anaphylaxis.     fluticasone (FLONASE) 50 MCG/ACT nasal spray Place 1 spray into both nostrils daily.     fluticasone-salmeterol (ADVAIR HFA) 115-21 MCG/ACT inhaler Inhale 2 puffs into the lungs 2 (two) times daily. 12 g 3   furosemide (LASIX) 40 MG tablet Take 1.5 tablets (60 mg total) by mouth daily. 45 tablet 6   Glucosamine HCl (GLUCOSAMINE PO) Take 1 tablet by  mouth daily.     guaiFENesin (MUCINEX) 600 MG 12 hr tablet Take 1 tablet (600 mg total) by mouth 2 (two) times daily. 60 tablet 5   ipratropium (ATROVENT) 0.06 % nasal spray Place 2 sprays into both nostrils 4 (four) times daily. 15 mL 3   Lidocaine, Anorectal, 5 % CREA Apply 1 application topically 2 (two) times daily. 30 g 1   losartan (COZAAR) 25 MG tablet Take 0.5 tablets (12.5 mg total) by mouth at bedtime. 15 tablet 3   mometasone-formoterol (DULERA) 100-5 MCG/ACT AERO Inhale 2 puffs into the lungs 2 (two) times daily.     montelukast (SINGULAIR) 10 MG tablet Take 10 mg by mouth at bedtime.     nicotine (NICODERM CQ -  DOSED IN MG/24 HOURS) 14 mg/24hr patch Place 1 patch (14 mg total) onto the skin daily. 28 patch 0   nicotine (NICODERM CQ - DOSED IN MG/24 HOURS) 21 mg/24hr patch Place 21 mg onto the skin daily as needed.     omeprazole (PRILOSEC) 20 MG capsule Take 1 capsule (20 mg total) by mouth 2 (two) times daily before a meal. ACID REFLUX 180 capsule 1   oxyCODONE-acetaminophen (PERCOCET) 5-325 MG tablet Take 1 tablet by mouth every 6 (six) hours as needed for severe pain. 16 tablet 0   polyethylene glycol (MIRALAX / GLYCOLAX) 17 g packet Take 17 g by mouth daily as needed. 14 each 0   predniSONE (DELTASONE) 10 MG tablet TAKE 1 TABLET(10 MG) BY MOUTH DAILY WITH BREAKFAST 30 tablet 5   Tiotropium Bromide Monohydrate (SPIRIVA RESPIMAT) 2.5 MCG/ACT AERS Inhale 2 puffs into the lungs.     vitamin B-12 (CYANOCOBALAMIN) 1000 MCG tablet Take 1,000 mcg by mouth daily.     ADVAIR DISKUS 250-50 MCG/ACT AEPB INHALE 1 PUFF INTO THE LUNGS EVERY 12 HOURS (Patient taking differently: 1 puff 2 (two) times daily as needed (asthma).) 60 each 11   potassium chloride SA (KLOR-CON M20) 20 MEQ tablet Take 1 tablet (20 mEq total) by mouth daily. 90 tablet 3   No facility-administered medications prior to visit.     Review of Systems:   Constitutional:   No  weight loss, night sweats,  Fevers, chills, +  fatigue, or  lassitude.  HEENT:   No headaches,  Difficulty swallowing,  Tooth/dental problems, or  Sore throat,                No sneezing, itching, ear ache, nasal congestion, post nasal drip,   CV:  No chest pain,  Orthopnea, PND, swelling in lower extremities, anasarca, dizziness, palpitations, syncope.   GI  No heartburn, indigestion, abdominal pain, nausea, vomiting, diarrhea, change in bowel habits, loss of appetite, bloody stools.   Resp:.  No chest wall deformity  Skin: no rash or lesions.  GU: no dysuria, change in color of urine, no urgency or frequency.  No flank pain, no hematuria   MS:  No joint pain or swelling.  No decreased range of motion.  No back pain.    Physical Exam  BP 110/66 (BP Location: Left Arm, Patient Position: Sitting, Cuff Size: Normal)    Pulse (!) 51    Temp 98 F (36.7 C) (Oral)    Ht 5\' 10"  (1.778 m)    Wt 226 lb (102.5 kg)    SpO2 95%    BMI 32.43 kg/m   GEN: A/Ox3; pleasant , NAD, well nourished    HEENT:  [place]/AT,  NOSE-clear, THROAT-clear, no lesions, no postnasal drip or exudate noted.   NECK:  Supple w/ fair ROM; no JVD; normal carotid impulses w/o bruits; no thyromegaly or nodules palpated; no lymphadenopathy.    RESP  Clear  P & A; w/o, wheezes/ rales/ or rhonchi. no accessory muscle use, no dullness to percussion  CARD:  RRR, no m/r/g, tr  peripheral edema, pulses intact, no cyanosis or clubbing.  GI:   Soft & nt; nml bowel sounds; no organomegaly or masses detected.   Musco: Warm bil, no deformities or joint swelling noted.   Neuro: alert, no focal deficits noted.    Skin: Warm, no lesions or rashes    Lab Results:  CBC    Component Value Date/Time   WBC 6.5 01/29/2022 0648  RBC 4.73 01/29/2022 0648   HGB 15.7 01/29/2022 0648   HGB 15.4 09/26/2021 1318   HCT 45.3 01/29/2022 0648   HCT 44.4 09/26/2021 1318   PLT 194 01/29/2022 0648   PLT 215 09/26/2021 1318   MCV 95.8 01/29/2022 0648   MCV 95 09/26/2021 1318   MCH  33.2 01/29/2022 0648   MCHC 34.7 01/29/2022 0648   RDW 13.2 01/29/2022 0648   RDW 12.6 09/26/2021 1318   LYMPHSABS 0.7 09/26/2021 1318   MONOABS 0.5 07/23/2021 0317   EOSABS 0.0 09/26/2021 1318   BASOSABS 0.0 09/26/2021 1318    BMET    Component Value Date/Time   NA 140 01/29/2022 0648   NA 139 12/11/2021 1209   K 3.9 01/29/2022 0648   CL 105 01/29/2022 0648   CO2 25 01/29/2022 0648   GLUCOSE 113 (H) 01/29/2022 0648   BUN 19 01/29/2022 0648   BUN 25 12/11/2021 1209   CREATININE 0.80 01/29/2022 0648   CREATININE 1.04 07/10/2020 1035   CALCIUM 9.5 01/29/2022 0648   GFRNONAA >60 01/29/2022 0648   GFRAA >60 02/14/2020 1454    BNP    Component Value Date/Time   BNP 149.0 (H) 01/06/2022 1227    ProBNP    Component Value Date/Time   PROBNP 769 (H) 12/11/2021 1209   PROBNP 156.0 (H) 04/09/2021 1452    Imaging: No results found.    No flowsheet data found.  No results found for: NITRICOXIDE      Assessment & Plan:   COPD, group D, by GOLD 2017 classification [facility]) Patient does not need to be taken Advair and Dulera.  Patient will stop Advair.  Continue on Dulera twice daily.  Continue on Spiriva.  Patient is prone to frequent exacerbations.  He has now quit smoking congratulated on smoking cessation. We will continue on low-dose steroids and Daliresp.  Plan  Patient Instructions  Chest xray today  Mucinex DM Twice daily  As needed  cough/congestion  Fluids and rest.  Tylenol As needed   Continue on Dulera 2 puffs Twice daily , rinse after use.  Restart Spiriva daily  Stop Advair.  Continue Daliresp daily .  Continue on Prednisone  10mg  daily.  Albuterol inhaler or neb As needed   Continue on Oxygen 2l/m as needed to keep Oxygen Levels >88-90%.  Continue on Trilogy vent /BIPAP At bedtime  with Oxygen 4l/m  Great job not smoking .  Follow up with [name] or Artie Mcintyre NP in 3 months w/  PFT and As needed   Please contact office for sooner follow up if  symptoms do not improve or worsen or seek emergency care      Tonight  Obstructive sleep apnea syndrome Sleep apnea and chronic respiratory failure.  Controlled on nocturnal trilogy with oxygen  Plan  Patient Instructions  Chest xray today  Mucinex DM Twice daily  As needed  cough/congestion  Fluids and rest.  Tylenol As needed   Continue on Dulera 2 puffs Twice daily , rinse after use.  Restart Spiriva daily  Stop Advair.  Continue Daliresp daily .  Continue on Prednisone  10mg  daily.  Albuterol inhaler or neb As needed   Continue on Oxygen 2l/m as needed to keep Oxygen Levels >88-90%.  Continue on Trilogy vent /BIPAP At bedtime  with Oxygen 4l/m  Great job not smoking .  Follow up with [name] or Marquetta Weiskopf NP in 3 months w/  PFT and As needed   Please contact office for  sooner follow up if symptoms do not improve or worsen or seek emergency care        Chronic respiratory failure with hypoxia ([place]) Continue on oxygen to maintain O2 saturations greater than 88 to 90%.  CAP (community acquired pneumonia) Patient was treated for pneumonia in December 2022.  Clinically improved chest x-ray showed rounded consolidation/density on lateral view.  Patient needs a follow-up chest x-ray.     Rexene Edison, NP 02/10/2022

## 2022-02-10 NOTE — Assessment & Plan Note (Signed)
Patient was treated for pneumonia in December 2022.  Clinically improved chest x-ray showed rounded consolidation/density on lateral view.  Patient needs a follow-up chest x-ray.

## 2022-02-10 NOTE — Assessment & Plan Note (Signed)
Continue on oxygen to maintain O2 saturations greater than 88 to 90%. 

## 2022-02-10 NOTE — Assessment & Plan Note (Signed)
Sleep apnea and chronic respiratory failure.  Controlled on nocturnal trilogy with oxygen  Plan  Patient Instructions  Chest xray today  Mucinex DM Twice daily  As needed  cough/congestion  Fluids and rest.  Tylenol As needed   Continue on Dulera 2 puffs Twice daily , rinse after use.  Restart Spiriva daily  Stop Advair.  Continue Daliresp daily .  Continue on Prednisone  10mg  daily.  Albuterol inhaler or neb As needed   Continue on Oxygen 2l/m as needed to keep Oxygen Levels >88-90%.  Continue on Trilogy vent /BIPAP At bedtime  with Oxygen 4l/m  Great job not smoking .  Follow up with [name] or Deavin Forst NP in 3 months w/  PFT and As needed   Please contact office for sooner follow up if symptoms do not improve or worsen or seek emergency care

## 2022-02-12 NOTE — Progress Notes (Addendum)
Electrophysiology Office Note Date: 02/16/2022  ID:  Robert Church, DOB 1956-10-25, MRN [id number]  PCP: Janith Lima, MD Primary Cardiologist: Quay Burow, MD Electrophysiologist: Constance Haw, MD   CC: Routine ICD follow-up  Robert Church is a 66 y.o. male seen today for [name], MD for routine electrophysiology followup.  Pt underwent Barostim implantation 01/29/2022 (commercial)  His goals for device titration are to walk into a building without SOB. Get to the point where he can ride motorcycle again (strength wise)   Since implant patient has some intermittent, momentary dizziness; room spinning. Remains SOB with walking into a building; rarely with changing his clothes.   Device History: St. Jude Dual Chamber PPM implanted 03/2021 for CHB. Failed Upgrade attempt 10/07/2021 Barostim implantation 01/29/2022   Past Medical History:  Diagnosis Date   Adenomatous colon polyp    Allergy    Anal fissure    Arthritis    Asthma    Bronchitis    CHF (congestive heart failure) ([place])    COPD, group D, by GOLD 2017 classification ([place])    Dyspnea    Emphysema lung (HCC)    GERD (gastroesophageal reflux disease)    History of hiatal hernia    Hyperlipidemia    Hypertension    NICM (nonischemic cardiomyopathy) (HCC)    OSA (obstructive sleep apnea)    Pneumonia    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   Requires supplemental oxygen    Sinusitis    SOB (shortness of breath)    Past Surgical History:  Procedure Laterality Date   BIV UPGRADE N/A 10/07/2021   Procedure: BIV PPM UPGRADE;  Surgeon: Constance Haw, MD;  Location: [place] [facility];  Service: Cardiovascular;  Laterality: N/A;   LEFT HEART CATH AND CORONARY ANGIOGRAPHY N/A 04/19/2017   Procedure: Left Heart Cath and Coronary Angiography;  Surgeon: Belva Crome, MD;  Location: [place] [facility];  Service: Cardiovascular;  Laterality: N/A;   PACEMAKER IMPLANT N/A  04/16/2021   Procedure: PACEMAKER IMPLANT;  Surgeon: Constance Haw, MD;  Location: [facility] [facility];  Service: Cardiovascular;  Laterality: N/A;   RIGHT/LEFT HEART CATH AND CORONARY ANGIOGRAPHY N/A 08/22/2021   Procedure: RIGHT/LEFT HEART CATH AND CORONARY ANGIOGRAPHY;  Surgeon: Jolaine Artist, MD;  Location: [facility];  Service: Cardiovascular;  Laterality: N/A;   TONSILLECTOMY      Current Outpatient Medications  Medication Sig Dispense Refill   albuterol (PROVENTIL) (2.5 MG/3ML) 0.083% nebulizer solution Take 3 mLs (2.5 mg total) by nebulization every 4 (four) hours as needed for wheezing or shortness of breath. 75 mL 11   albuterol (VENTOLIN HFA) 108 (90 Base) MCG/ACT inhaler INHALE 2 PUFFS INTO THE LUNGS EVERY 6 HOURS AS NEEDED FOR WHEEZING 18 g 2   allopurinol (ZYLOPRIM) 300 MG tablet Take 1 tablet (300 mg total) by mouth daily. 90 tablet 0   ALPRAZolam (XANAX) 0.25 MG tablet Take 1 tablet (0.25 mg total) by mouth 3 (three) times daily. 90 tablet 2   AMBULATORY NON FORMULARY MEDICATION Medication Name: Nitroglycerin gel 0.125% apply 2-3 times daily to the anal canal. (Patient taking differently: Place 1 application rectally in the morning, at noon, and at bedtime. Medication Name: Nitroglycerin gel 0.125% apply 2-3 times daily to the anal canal.) 30 g 1   aspirin EC 81 MG tablet Take 81 mg by mouth daily. Swallow whole.     Aspirin-Salicylamide-Caffeine (ARTHRITIS STRENGTH BC  POWDER PO) Take 0.5 packets by mouth daily.     cholecalciferol (VITAMIN D3) 25 MCG (1000 UNIT) tablet Take 1,000 Units by mouth daily.     DALIRESP 500 MCG TABS tablet Take 0.5 tablets (250 mcg total) by mouth 2 (two) times daily. 30 tablet 5   EPINEPHrine 0.3 mg/0.3 mL IJ SOAJ injection Inject 0.3 mg into the muscle as needed for anaphylaxis.     fluticasone (FLONASE) 50 MCG/ACT nasal spray Place 1 spray into both nostrils daily.     furosemide (LASIX) 40 MG tablet Take 1.5 tablets (60 mg total)  by mouth daily. 45 tablet 6   Glucosamine HCl (GLUCOSAMINE PO) Take 1 tablet by mouth daily.     guaiFENesin (MUCINEX) 600 MG 12 hr tablet Take 1 tablet (600 mg total) by mouth 2 (two) times daily. 60 tablet 5   ipratropium (ATROVENT) 0.06 % nasal spray Place 2 sprays into both nostrils 4 (four) times daily. 15 mL 3   Lidocaine, Anorectal, 5 % CREA Apply 1 application topically 2 (two) times daily. 30 g 1   losartan (COZAAR) 25 MG tablet Take 0.5 tablets (12.5 mg total) by mouth at bedtime. 15 tablet 3   mometasone-formoterol (DULERA) 100-5 MCG/ACT AERO Inhale 2 puffs into the lungs 2 (two) times daily.     montelukast (SINGULAIR) 10 MG tablet Take 10 mg by mouth at bedtime.     nicotine (NICODERM CQ - DOSED IN MG/24 HOURS) 14 mg/24hr patch Place 1 patch (14 mg total) onto the skin daily. 28 patch 0   omeprazole (PRILOSEC) 20 MG capsule Take 1 capsule (20 mg total) by mouth 2 (two) times daily before a meal. ACID REFLUX 180 capsule 1   oxyCODONE-acetaminophen (PERCOCET) 5-325 MG tablet Take 1 tablet by mouth every 6 (six) hours as needed for severe pain. 16 tablet 0   polyethylene glycol (MIRALAX / GLYCOLAX) 17 g packet Take 17 g by mouth daily as needed. 14 each 0   predniSONE (DELTASONE) 10 MG tablet TAKE 1 TABLET(10 MG) BY MOUTH DAILY WITH BREAKFAST 30 tablet 5   Tiotropium Bromide Monohydrate (SPIRIVA RESPIMAT) 2.5 MCG/ACT AERS Inhale 2 puffs into the lungs daily. 4 g 4   vitamin B-12 (CYANOCOBALAMIN) 1000 MCG tablet Take 1,000 mcg by mouth daily.     colchicine 0.6 MG tablet Take 1 tablet (0.6 mg total) by mouth 2 (two) times daily. (Patient not taking: Reported on 02/16/2022) 180 tablet 0   potassium chloride SA (KLOR-CON M20) 20 MEQ tablet Take 1 tablet (20 mEq total) by mouth daily. 90 tablet 3   No current facility-administered medications for this visit.    Allergies:   Spironolactone, Clindamycin, Doxycycline, E-mycin [erythromycin base], Jardiance [empagliflozin], Penicillins, and  Rosuvastatin   Social History: Social History   Socioeconomic History   Marital status: Single    Spouse name: Not on file   Number of children: 1   Years of education: 12   Highest education level: Not on file  Occupational History   Occupation: disabled  Tobacco Use   Smoking status: Former    Packs/day: 2.00    Years: 46.00    Pack years: 92.00    Types: Cigarettes    Quit date: 01/27/2022    Years since quitting: 0.0   Smokeless tobacco: Never   Tobacco comments:    Smoking very little if he even has one in a week.  Does not even want one.  Vaping Use   Vaping Use: Never used  Substance and Sexual Activity   Alcohol use: Not Currently    Alcohol/week: 28.0 standard drinks    Types: 28 Cans of beer per week   Drug use: No   Sexual activity: Yes    Partners: Female    Birth control/protection: None  Other Topics Concern   Not on file  Social History Narrative   Right handed   Tea sometimes and coffee (1/2 caffeine)   Social Determinants of Health   Financial Resource Strain: Medium Risk   Difficulty of Paying Living Expenses: Somewhat hard  Food Insecurity: No Food Insecurity   Worried About Charity fundraiser in the Last Year: Never true   Ran Out of Food in the Last Year: Never true  Transportation Needs: No Transportation Needs   Lack of Transportation (Medical): No   Lack of Transportation (Non-Medical): No  Physical Activity: Not on file  Stress: Not on file  Social Connections: Not on file  Intimate Partner Violence: Not on file    Family History: Family History  Problem Relation Age of Onset   Lung cancer Father    High blood pressure Mother    Diabetes Maternal Grandmother    Colon polyps Neg Hx    Esophageal cancer Neg Hx    Pancreatic cancer Neg Hx    Stomach cancer Neg Hx     Review of Systems: All other systems reviewed and are otherwise negative except as noted above.   Physical Exam: Vitals:   02/16/22 1138  BP: 128/82  Pulse:  73  SpO2: 93%  Weight: 233 lb 12.8 oz (106.1 kg)  Height: 5\' 10"  (1.778 m)     General: Pleasant, NAD. No resp difficulty Psych: Normal affect. HEENT:  Normal, without mass or lesion.         Neck: Supple, no bruits or JVD. Carotids 2+. No lymphadenopathy/thyromegaly appreciated. Heart: PMI nondisplaced. RRR no s3, s4, or murmurs. Lungs:  Resp regular and unlabored, CTA. Abdomen: Soft, non-tender, non-distended, No HSM, BS + x 4.   Extremities: No clubbing, cyanosis or edema. DP/PT/Radials 2+ and equal bilaterally. Neuro: Alert and oriented X 3. Moves all extremities spontaneously.   ICD interrogation- Not performed today  Barostim interrogation: Performed personally. See scanned report.   EKG:  EKG is ordered today. EKG shows NSR at 73 bpm  Recent Labs: 04/16/2021: TSH 1.147 12/11/2021: NT-Pro BNP 769 01/06/2022: B Natriuretic Peptide 149.0 01/29/2022: ALT 22; BUN 19; Creatinine, Ser 0.80; Hemoglobin 15.7; Platelets 194; Potassium 3.9; Sodium 140   Wt Readings from Last 3 Encounters:  02/16/22 233 lb 12.8 oz (106.1 kg)  02/10/22 226 lb (102.5 kg)  02/09/22 230 lb (104.3 kg)     Other studies Reviewed: Additional studies/ records that were reviewed today include: Previous EP and vascular office notes.   Assessment and Plan:  1.  Chronic systolic dysfunction s/p St. Jude  Dual chamber PPM S/p Barostim (commercial) 01/29/2022 Volume status somewhat elevated. Encouraged lasix 80 mg x 2 days. Labs today.  NYHA III symptoms currently.  Stable on an appropriate medical regimen Device titrated from 1.0 ma to 3.0 ma. He had stim described as a fullness at 4.0 ma. Oddly, he also felt a pinch any time the setting was changed, or with impedence testing.  This has been previously described by others.  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  2. ? Atrial fibrillation Previously monitor strips without clear AF Continue to monitor via device.  3. NICM Echo  04/16/21 LVEF 30-35%  GDMT has been limited by intolerances. BB has been limited by GI side effects. Followed by CHF team.  Current medicines are reviewed at length with the patient today.    Labs/ tests ordered today include:  Orders Placed This Encounter  Procedures   Basic metabolic panel   Pro b natriuretic peptide (BNP)   EKG 12-Lead     Disposition:   Follow up with EP APP in  2 months. Sooner pending barostim work up.      Jacalyn Lefevre, PA-C  02/16/2022 12:49 PM  [place] [address] [phone] (office) [phone] (fax)

## 2022-02-13 NOTE — Progress Notes (Signed)
Spoke with pt and he voices understanding. Pt did not have any further questions. He is aware that he will get a call once his lung cancer screening and he did not have any questions.

## 2022-02-16 ENCOUNTER — Encounter: Payer: Self-pay | Admitting: Student

## 2022-02-16 ENCOUNTER — Telehealth: Payer: Self-pay | Admitting: Student

## 2022-02-16 ENCOUNTER — Ambulatory Visit (INDEPENDENT_AMBULATORY_CARE_PROVIDER_SITE_OTHER): Payer: 59 | Admitting: Student

## 2022-02-16 ENCOUNTER — Other Ambulatory Visit: Payer: Self-pay

## 2022-02-16 VITALS — BP 128/82 | HR 73 | Ht 70.0 in | Wt 233.8 lb

## 2022-02-16 DIAGNOSIS — I5042 Chronic combined systolic (congestive) and diastolic (congestive) heart failure: Secondary | ICD-10-CM

## 2022-02-16 DIAGNOSIS — G4733 Obstructive sleep apnea (adult) (pediatric): Secondary | ICD-10-CM

## 2022-02-16 DIAGNOSIS — J449 Chronic obstructive pulmonary disease, unspecified: Secondary | ICD-10-CM

## 2022-02-16 DIAGNOSIS — I442 Atrioventricular block, complete: Secondary | ICD-10-CM | POA: Diagnosis not present

## 2022-02-16 NOTE — Telephone Encounter (Signed)
Called patient to let him know that his call will be sent to [facility] nurse for advisement and they will get back to him tomorrow. Patient verbalized understanding.

## 2022-02-16 NOTE — Telephone Encounter (Signed)
° °  Pt said, Robert Church adjusted his Robert Church and since he got home he is having headache that is coming from his eyes, he is not sure if just because of the adjustment or his body is trying to adjust to it

## 2022-02-16 NOTE — Patient Instructions (Signed)
Medication Instructions:  Your physician has recommended you make the following change in your medication:   Take Furosemide (lasix) 80mg  for 2 days then resume normal dose  *If you need a refill on your cardiac medications before your next appointment, please call your pharmacy*   Lab Work: TODAY: BMET, BNP  If you have labs (blood work) drawn today and your tests are completely normal, you will receive your results only by: [place] (if you have MyChart) OR A paper copy in the mail If you have any lab test that is abnormal or we need to change your treatment, we will call you to review the results.   Follow-Up: At [facility], you and your health needs are our priority.  As part of our continuing mission to provide you with exceptional heart care, we have created designated Provider Care Teams.  These Care Teams include your primary Cardiologist (physician) and Advanced Practice Providers (APPs -  Physician Assistants and Nurse Practitioners) who all work together to provide you with the care you need, when you need it.   Your next appointment:    03/18/2022

## 2022-02-17 ENCOUNTER — Other Ambulatory Visit: Payer: Self-pay | Admitting: Internal Medicine

## 2022-02-17 ENCOUNTER — Telehealth: Payer: Self-pay | Admitting: Internal Medicine

## 2022-02-17 ENCOUNTER — Telehealth: Payer: Self-pay | Admitting: Adult Health

## 2022-02-17 DIAGNOSIS — M4802 Spinal stenosis, cervical region: Secondary | ICD-10-CM

## 2022-02-17 DIAGNOSIS — M170 Bilateral primary osteoarthritis of knee: Secondary | ICD-10-CM

## 2022-02-17 DIAGNOSIS — M1A0790 Idiopathic chronic gout, unspecified ankle and foot, without tophus (tophi): Secondary | ICD-10-CM

## 2022-02-17 DIAGNOSIS — M1009 Idiopathic gout, multiple sites: Secondary | ICD-10-CM

## 2022-02-17 DIAGNOSIS — M47816 Spondylosis without myelopathy or radiculopathy, lumbar region: Secondary | ICD-10-CM

## 2022-02-17 DIAGNOSIS — M5030 Other cervical disc degeneration, unspecified cervical region: Secondary | ICD-10-CM

## 2022-02-17 LAB — BASIC METABOLIC PANEL
BUN/Creatinine Ratio: 15 (ref 10–24)
BUN: 15 mg/dL (ref 8–27)
CO2: 27 mmol/L (ref 20–29)
Calcium: 9.1 mg/dL (ref 8.6–10.2)
Chloride: 104 mmol/L (ref 96–106)
Creatinine, Ser: 0.99 mg/dL (ref 0.76–1.27)
Glucose: 115 mg/dL — ABNORMAL HIGH (ref 70–99)
Potassium: 4.2 mmol/L (ref 3.5–5.2)
Sodium: 144 mmol/L (ref 134–144)
eGFR: 86 mL/min/{1.73_m2} (ref >59)

## 2022-02-17 LAB — PRO B NATRIURETIC PEPTIDE: NT-Pro BNP: 1090 pg/mL — ABNORMAL HIGH (ref 0–210)

## 2022-02-17 MED ORDER — TRAMADOL HCL 50 MG PO TABS: 50.0000 mg | ORAL_TABLET | Freq: Three times a day (TID) | ORAL | 2 refills | Status: DC | PRN

## 2022-02-17 NOTE — Telephone Encounter (Signed)
I spoke with pt, he is feeling some better but not much. He took a BC powder a few minutes ago and wants to see if that helps. He will call back later if he wants to come in for [place] to adjust his Barostim settings.

## 2022-02-17 NOTE — Telephone Encounter (Signed)
Spoke to patient.  He would like recommendations on an allergy pill that will not cause him to be drowsy, cause high BP and keep him awake at night?   Tammy, please advise. Thanks

## 2022-02-17 NOTE — Telephone Encounter (Signed)
LM for pt to call back.

## 2022-02-17 NOTE — Telephone Encounter (Signed)
1.Medication Requested: traMADol (ULTRAM) 50 MG tablet  2. Pharmacy (Name, [place]): [facility] [place], [facility] [place]  Phone:  [phone] Fax:  [phone]   3. On Med List: yes  4. Last Visit with PCP: 10.31.22  5. Next visit date with PCP: 04.19.23   Agent: Please be advised that RX refills may take up to 3 business days. We ask that you follow-up with your pharmacy.

## 2022-02-17 NOTE — Telephone Encounter (Signed)
May try Allegra 180 mg daily as needed or Claritin 10 mg daily as needed.  Both of these typically are not sedating.

## 2022-02-17 NOTE — Telephone Encounter (Signed)
Patient is aware of recommendations and voiced his understanding.  Nothing further needed. 

## 2022-02-18 ENCOUNTER — Ambulatory Visit: Payer: 59 | Admitting: Orthopaedic Surgery

## 2022-02-19 ENCOUNTER — Telehealth: Payer: Self-pay | Admitting: Student

## 2022-02-19 ENCOUNTER — Telehealth: Payer: Self-pay | Admitting: Adult Health

## 2022-02-19 MED ORDER — PREDNISONE 10 MG PO TABS: 10.0000 mg | ORAL_TABLET | Freq: Every day | ORAL | 0 refills | Status: DC

## 2022-02-19 NOTE — Telephone Encounter (Signed)
Spoke with pt who states he has had a runny nose and productive cough (whit mucus) x 4 days. Pt denies fever/ chills/ GI upset. Pt tried Benadryl and Claritin which did not help. Pt is taking inhalers, Singulair, mucinex and prednisone as directed.  Pt does state he has access to Levaquin at home and is wondering if he should take it. Dr. Chase Caller please advise as [name] is unavailable.  ?

## 2022-02-19 NOTE — Telephone Encounter (Signed)
Routing to Robert Church as she has seen pt most recently.   ?

## 2022-02-19 NOTE — Telephone Encounter (Signed)
See note dated 2/27.  Patient called to report it still hasn't changed any.  He is still having the same issues he was having the other day.   ?

## 2022-02-19 NOTE — Telephone Encounter (Signed)
Hold off on Levaquin for now.  Would recommend Delsym 2 teaspoons twice daily for cough. ?Continue on Claritin daily.  Saline nasal rinses.  Tylenol and fluids as needed. ?Increase prednisone to 20 mg for 5 days then resume 10 mg daily dosing. ?Check for COVID-19 -home test.  If positive call our office back. ?If symptoms worsen with discolored mucus call our office back ?Please contact office for sooner follow up if symptoms do not improve or worsen or seek emergency care  ?Follow-up for CT chest as planned part of the lung cancer screening program ?

## 2022-02-19 NOTE — Telephone Encounter (Signed)
Spoke with the patient who reports that his symptoms have still not improved since having his Barostim adjusted on Monday. He reports he has been dizzy, ears ringing, and headache behind his eyes.  ?Advised patient that I would send a message to Jonni Sanger to see when we could bring him in.  ?

## 2022-02-19 NOTE — Telephone Encounter (Signed)
Spoke with pt and notified him of Tammy's recommendations. Pt stated understanding and verified pharmacy. Pt states he took home Covid test 2 days ago which is negative. Medication order placed. Nothing further needed at this time.  ?

## 2022-02-20 ENCOUNTER — Telehealth: Payer: Self-pay | Admitting: Adult Health

## 2022-02-20 ENCOUNTER — Other Ambulatory Visit: Payer: Self-pay

## 2022-02-20 ENCOUNTER — Ambulatory Visit (INDEPENDENT_AMBULATORY_CARE_PROVIDER_SITE_OTHER): Payer: 59 | Admitting: Student

## 2022-02-20 ENCOUNTER — Encounter: Payer: 59 | Admitting: Student

## 2022-02-20 VITALS — BP 118/68

## 2022-02-20 DIAGNOSIS — I5042 Chronic combined systolic (congestive) and diastolic (congestive) heart failure: Secondary | ICD-10-CM | POA: Diagnosis not present

## 2022-02-20 MED ORDER — PREDNISONE 20 MG PO TABS: 20.0000 mg | ORAL_TABLET | Freq: Every day | ORAL | 0 refills | Status: DC

## 2022-02-20 NOTE — Patient Instructions (Signed)
Medication Instructions:  ?Your physician recommends that you continue on your current medications as directed. Please refer to the Current Medication list given to you today. ? ?*If you need a refill on your cardiac medications before your next appointment, please call your pharmacy* ? ? ?Lab Work: ?None  ?If you have labs (blood work) drawn today and your tests are completely normal, you will receive your results only by: ?MyChart Message (if you have MyChart) OR ?A paper copy in the mail ?If you have any lab test that is abnormal or we need to change your treatment, we will call you to review the results. ? ? ?Follow-Up: ?At [facility], you and your health needs are our priority.  As part of our continuing mission to provide you with exceptional heart care, we have created designated Provider Care Teams.  These Care Teams include your primary Cardiologist (physician) and Advanced Practice Providers (APPs -  Physician Assistants and Nurse Practitioners) who all work together to provide you with the care you need, when you need it. ?

## 2022-02-20 NOTE — Telephone Encounter (Signed)
I called pt to offer him an appt to come in today to have his Barostim readjusted but he stated that he thinks he's just having allergy symptoms. He wants to wait a little bit and see how he feels and will call back if he decides he needs to come in.  ?

## 2022-02-20 NOTE — Telephone Encounter (Signed)
I called the patient and let him know that I have sent in a 20 mg tablet for him. He needed a new prescription. Nothing further needed.  ?

## 2022-02-20 NOTE — Progress Notes (Signed)
Electrophysiology Office Note Date: 02/20/2022  ID:  [name], DOB October 13, 1956, MRN [id number]  PCP: Janith Lima, MD Primary Cardiologist: Quay Burow, MD Electrophysiologist: Constance Haw, MD   CC: Routine ICD follow-up  Robert Church is a 66 y.o. male seen today for Robert Meredith Leeds, MD for routine electrophysiology followup.  Pt underwent Barostim implantation 01/29/2022 (commercial)  His goals for device titration are to walk into a building without SOB. Get to the point where he can ride motorcycle again (strength wise)   Add on today for HA after barostim titration.   Symptoms more consistent with sinus HA given distribution and description. HA along eyebrows with pressure behind eyes and congest ears.   Device changed from 3.0 mA @ 125 ms to 4.0 mA @ 65 ms.  No change in symptoms.   Encouraged OTC treatment of congestion symptoms, avoiding decongestants. PCP follow up if unchanged.   BP on arrival 118/68 Post adjustment 120/70  Next barostim adjustment as scheduled.   [name] [address] [place], PA-C  02/20/2022 12:42 PM   Device History: St. Jude Dual Chamber PPM implanted 03/2021 for CHB. Failed Upgrade attempt 10/07/2021 Barostim implantation 01/29/2022   Past Medical History:  Diagnosis Date   Adenomatous colon polyp    Allergy    Anal fissure    Arthritis    Asthma    Bronchitis    CHF (congestive heart failure) ([place])    COPD, group D, by GOLD 2017 classification ([place])    Dyspnea    Emphysema lung (HCC)    GERD (gastroesophageal reflux disease)    History of hiatal hernia    Hyperlipidemia    Hypertension    NICM (nonischemic cardiomyopathy) (HCC)    OSA (obstructive sleep apnea)    Pneumonia    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   Requires supplemental oxygen    Sinusitis    SOB (shortness of breath)    Past Surgical History:  Procedure Laterality Date   BIV UPGRADE N/A 10/07/2021   Procedure: BIV PPM UPGRADE;   Surgeon: Constance Haw, MD;  Location: [facility];  Service: Cardiovascular;  Laterality: N/A;   LEFT HEART CATH AND CORONARY ANGIOGRAPHY N/A 04/19/2017   Procedure: Left Heart Cath and Coronary Angiography;  Surgeon: Belva Crome, MD;  Location: [facility];  Service: Cardiovascular;  Laterality: N/A;   PACEMAKER IMPLANT N/A 04/16/2021   Procedure: PACEMAKER IMPLANT;  Surgeon: Constance Haw, MD;  Location: [facility];  Service: Cardiovascular;  Laterality: N/A;   RIGHT/LEFT HEART CATH AND CORONARY ANGIOGRAPHY N/A 08/22/2021   Procedure: RIGHT/LEFT HEART CATH AND CORONARY ANGIOGRAPHY;  Surgeon: Jolaine Artist, MD;  Location: [facility];  Service: Cardiovascular;  Laterality: N/A;   TONSILLECTOMY      Current Outpatient Medications  Medication Sig Dispense Refill   albuterol (PROVENTIL) (2.5 MG/3ML) 0.083% nebulizer solution Take 3 mLs (2.5 mg total) by nebulization every 4 (four) hours as needed for wheezing or shortness of breath. 75 mL 11   albuterol (VENTOLIN HFA) 108 (90 Base) MCG/ACT inhaler INHALE 2 PUFFS INTO THE LUNGS EVERY 6 HOURS AS NEEDED FOR WHEEZING 18 g 2   allopurinol (ZYLOPRIM) 300 MG tablet Take 1 tablet (300 mg total) by mouth daily. 90 tablet 0   ALPRAZolam (XANAX) 0.25 MG tablet Take 1 tablet (0.25 mg total) by mouth 3 (three) times daily. 90 tablet 2   AMBULATORY NON  FORMULARY MEDICATION Medication Name: Nitroglycerin gel 0.125% apply 2-3 times daily to the anal canal. (Patient taking differently: Place 1 application rectally in the morning, at noon, and at bedtime. Medication Name: Nitroglycerin gel 0.125% apply 2-3 times daily to the anal canal.) 30 g 1   aspirin EC 81 MG tablet Take 81 mg by mouth daily. Swallow whole.     Aspirin-Salicylamide-Caffeine (ARTHRITIS STRENGTH BC POWDER PO) Take 0.5 packets by mouth daily.     cholecalciferol (VITAMIN D3) 25 MCG (1000 UNIT) tablet Take 1,000 Units by mouth daily.     colchicine 0.6  MG tablet TAKE 1 TABLET(0.6 MG) BY MOUTH TWICE DAILY 180 tablet 0   DALIRESP 500 MCG TABS tablet Take 0.5 tablets (250 mcg total) by mouth 2 (two) times daily. 30 tablet 5   EPINEPHrine 0.3 mg/0.3 mL IJ SOAJ injection Inject 0.3 mg into the muscle as needed for anaphylaxis.     fluticasone (FLONASE) 50 MCG/ACT nasal spray Place 1 spray into both nostrils daily.     furosemide (LASIX) 40 MG tablet Take 1.5 tablets (60 mg total) by mouth daily. 45 tablet 6   Glucosamine HCl (GLUCOSAMINE PO) Take 1 tablet by mouth daily.     guaiFENesin (MUCINEX) 600 MG 12 hr tablet Take 1 tablet (600 mg total) by mouth 2 (two) times daily. 60 tablet 5   ipratropium (ATROVENT) 0.06 % nasal spray Place 2 sprays into both nostrils 4 (four) times daily. 15 mL 3   Lidocaine, Anorectal, 5 % CREA Apply 1 application topically 2 (two) times daily. 30 g 1   losartan (COZAAR) 25 MG tablet Take 0.5 tablets (12.5 mg total) by mouth at bedtime. 15 tablet 3   mometasone-formoterol (DULERA) 100-5 MCG/ACT AERO Inhale 2 puffs into the lungs 2 (two) times daily.     montelukast (SINGULAIR) 10 MG tablet Take 10 mg by mouth at bedtime.     nicotine (NICODERM CQ - DOSED IN MG/24 HOURS) 14 mg/24hr patch Place 1 patch (14 mg total) onto the skin daily. 28 patch 0   omeprazole (PRILOSEC) 20 MG capsule Take 1 capsule (20 mg total) by mouth 2 (two) times daily before a meal. ACID REFLUX 180 capsule 1   polyethylene glycol (MIRALAX / GLYCOLAX) 17 g packet Take 17 g by mouth daily as needed. 14 each 0   potassium chloride SA (KLOR-CON M20) 20 MEQ tablet Take 1 tablet (20 mEq total) by mouth daily. 90 tablet 3   predniSONE (DELTASONE) 10 MG tablet TAKE 1 TABLET(10 MG) BY MOUTH DAILY WITH BREAKFAST 30 tablet 5   predniSONE (DELTASONE) 10 MG tablet Take 1 tablet (10 mg total) by mouth daily with breakfast. 5 tablet 0   Tiotropium Bromide Monohydrate (SPIRIVA RESPIMAT) 2.5 MCG/ACT AERS Inhale 2 puffs into the lungs daily. 4 g 4   traMADol (ULTRAM)  50 MG tablet Take 1 tablet (50 mg total) by mouth every 8 (eight) hours as needed. 90 tablet 2   vitamin B-12 (CYANOCOBALAMIN) 1000 MCG tablet Take 1,000 mcg by mouth daily.     No current facility-administered medications for this visit.    Allergies:   Spironolactone, Clindamycin, Doxycycline, E-mycin [erythromycin base], Jardiance [empagliflozin], Penicillins, and Rosuvastatin   Social History: Social History   Socioeconomic History   Marital status: Single    Spouse name: Not on file   Number of children: 1   Years of education: 12   Highest education level: Not on file  Occupational History   Occupation: disabled  Tobacco Use   Smoking status: Former    Packs/day: 2.00    Years: 46.00    Pack years: 92.00    Types: Cigarettes    Quit date: 01/27/2022    Years since quitting: 0.0   Smokeless tobacco: Never   Tobacco comments:    Smoking very little if he even has one in a week.  Does not even want one.  Vaping Use   Vaping Use: Never used  Substance and Sexual Activity   Alcohol use: Not Currently    Alcohol/week: 28.0 standard drinks    Types: 28 Cans of beer per week   Drug use: No   Sexual activity: Yes    Partners: Female    Birth control/protection: None  Other Topics Concern   Not on file  Social History Narrative   Right handed   Tea sometimes and coffee (1/2 caffeine)   Social Determinants of Health   Financial Resource Strain: Medium Risk   Difficulty of Paying Living Expenses: Somewhat hard  Food Insecurity: No Food Insecurity   Worried About Charity fundraiser in the Last Year: Never true   Ran Out of Food in the Last Year: Never true  Transportation Needs: No Transportation Needs   Lack of Transportation (Medical): No   Lack of Transportation (Non-Medical): No  Physical Activity: Not on file  Stress: Not on file  Social Connections: Not on file  Intimate Partner Violence: Not on file    Family History: Family History  Problem Relation Age  of Onset   Lung cancer Father    High blood pressure Mother    Diabetes Maternal Grandmother    Colon polyps Neg Hx    Esophageal cancer Neg Hx    Pancreatic cancer Neg Hx    Stomach cancer Neg Hx     Review of Systems: Review of systems complete and found to be negative unless listed in HPI.     Physical Exam: Vitals:   02/20/22 1149  BP: 118/68     General: Pleasant, NAD. No resp difficulty Psych: Normal affect. HEENT:  Normal, without mass or lesion.         Neck: Supple, no bruits or JVD. Carotids 2+. No lymphadenopathy/thyromegaly appreciated. Heart: PMI nondisplaced. RRR no s3, s4, or murmurs. Lungs:  Resp regular and unlabored, CTA. Abdomen: Soft, non-tender, non-distended, No HSM, BS + x 4.   Extremities: No clubbing, cyanosis or edema. DP/PT/Radials 2+ and equal bilaterally. Neuro: Alert and oriented X 3. Moves all extremities spontaneously.   ICD interrogation- Not performed today  Barostim interrogation: Performed personally. See scanned report.   EKG:  EKG is not ordered today.  Recent Labs: 04/16/2021: TSH 1.147 01/06/2022: B Natriuretic Peptide 149.0 01/29/2022: ALT 22; Hemoglobin 15.7; Platelets 194 02/16/2022: BUN 15; Creatinine, Ser 0.99; NT-Pro BNP 1,090; Potassium 4.2; Sodium 144   Wt Readings from Last 3 Encounters:  02/16/22 233 lb 12.8 oz (106.1 kg)  02/10/22 226 lb (102.5 kg)  02/09/22 230 lb (104.3 kg)     Other studies Reviewed: Additional studies/ records that were reviewed today include: Previous EP and vascular office notes.   Assessment and Plan:  1.  Chronic systolic dysfunction s/p St. Jude  Dual chamber PPM S/p Barostim (commercial) 01/29/2022 Volume status improved. Encouraged lasix 80 mg x 2 days. Labs today.  NYHA III symptoms currently.  Stable on an appropriate medical regimen Device changed from 3.0 ma @ 125 ms to 4.0 ma @ 65 ms. No change in  symptoms. Suspect sinus HA.  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  2. ? Atrial fibrillation Previously monitor strips without clear AF Continue to monitor via device.    3. NICM Echo 04/16/21 LVEF 30-35%  GDMT has been limited by intolerances. BB has been limited by GI side effects. Followed by CHF team.  Current medicines are reviewed at length with the patient today.    Labs/ tests ordered today include:  No orders of the defined types were placed in this encounter.  Disposition:   Follow up with EP APP in 3-4 weeks as scheduled for further adjustment.     Jacalyn Lefevre, PA-C  02/20/2022 12:44 PM  [place] [phone] (office) [phone] (fax)

## 2022-02-22 ENCOUNTER — Telehealth: Payer: Self-pay | Admitting: Physician Assistant

## 2022-02-22 NOTE — Telephone Encounter (Signed)
Patient's Barostim device was adjusted on Friday, he called after hour answering service complaining of worsening shortness of breath and chest discomfort early this morning that woke him up.  He says he had taken 2 days of higher dose of diuretic.  He is aware that if he has increased leg edema, he should take additional doses of diuretic.  His main concern is Barostim adjustment is causing some of his current issue.  I do not think adjusting the Barostim setting could have caused his chest discomfort this morning.  He had normal coronary arteries on previous cardiac catheterization in September 2022, suspicion for ACS very low.  Although adjusting the Barostim may contribute to his shortness of breath though.  I recommended continue observation at this point.  I will forward a message to Oda Kilts PA-C to see what he thinks could be contributing to his current symptoms.  He is aware to go to the emergency room if he has worsening shortness of breath, orthopnea or PND. ?

## 2022-02-23 ENCOUNTER — Telehealth: Payer: Self-pay | Admitting: Student

## 2022-02-23 NOTE — Telephone Encounter (Signed)
Shirley Friar, PA-C ?  ?Please let him know it is very UNlikely that titrating the barostim has some how messed with his heart, especially since we went up such a small amount.  ? ?He was volume overloaded at the first visit in the week, and I recommended he follow up with HF clinic if his symptoms did not improve with several days of extra lasix.  ? ? ? ?Spoke with the patient who states that his breathing has gotten worse over the past couple of days. He reports that his weight is also up at 229. He reports usual weight is 225. Advised on recommendations above from [place]. Will forwards to CHF clinic to see if they have a sooner appointment for him.  ?

## 2022-02-23 NOTE — Telephone Encounter (Signed)
Pt c/o Shortness Of Breath: STAT if SOB developed within the last 24 hours or pt is noticeably SOB on the phone ? ?1. Are you currently SOB (can you hear that pt is SOB on the phone)? yes ? ?2. How long have you been experiencing SOB? Since [place] adjusted his Barostim ? ?3. Are you SOB when sitting or when up moving around? All the time ? ?4. Are you currently experiencing any other symptoms? Tired. Patient has not slept for two days.  ? ?Patient took his BP this morning 158/92 at 3:30 am  ?

## 2022-02-23 NOTE — Telephone Encounter (Signed)
Pt aware and agreeable with plan.  

## 2022-02-24 ENCOUNTER — Ambulatory Visit: Payer: 59 | Admitting: Internal Medicine

## 2022-02-25 ENCOUNTER — Telehealth: Payer: Self-pay | Admitting: Adult Health

## 2022-02-25 ENCOUNTER — Ambulatory Visit (HOSPITAL_COMMUNITY)
Admission: RE | Admit: 2022-02-25 | Discharge: 2022-02-25 | Disposition: A | Discharge status: Home / Self Care | Payer: 59 | Source: Ambulatory Visit | Attending: Family Medicine | Admitting: Family Medicine

## 2022-02-25 ENCOUNTER — Encounter (HOSPITAL_COMMUNITY): Payer: Self-pay

## 2022-02-25 ENCOUNTER — Other Ambulatory Visit: Payer: Self-pay

## 2022-02-25 VITALS — BP 130/88 | HR 85 | Wt 232.2 lb

## 2022-02-25 DIAGNOSIS — Z79899 Other long term (current) drug therapy: Secondary | ICD-10-CM | POA: Diagnosis not present

## 2022-02-25 DIAGNOSIS — I442 Atrioventricular block, complete: Secondary | ICD-10-CM

## 2022-02-25 DIAGNOSIS — I959 Hypotension, unspecified: Secondary | ICD-10-CM | POA: Insufficient documentation

## 2022-02-25 DIAGNOSIS — I5023 Acute on chronic systolic (congestive) heart failure: Secondary | ICD-10-CM | POA: Diagnosis present

## 2022-02-25 DIAGNOSIS — R42 Dizziness and giddiness: Secondary | ICD-10-CM | POA: Insufficient documentation

## 2022-02-25 DIAGNOSIS — F17210 Nicotine dependence, cigarettes, uncomplicated: Secondary | ICD-10-CM | POA: Diagnosis not present

## 2022-02-25 DIAGNOSIS — I10 Essential (primary) hypertension: Secondary | ICD-10-CM | POA: Diagnosis not present

## 2022-02-25 DIAGNOSIS — J440 Chronic obstructive pulmonary disease with acute lower respiratory infection: Secondary | ICD-10-CM | POA: Diagnosis not present

## 2022-02-25 DIAGNOSIS — I5042 Chronic combined systolic (congestive) and diastolic (congestive) heart failure: Secondary | ICD-10-CM | POA: Diagnosis not present

## 2022-02-25 DIAGNOSIS — I110 Hypertensive heart disease with heart failure: Secondary | ICD-10-CM | POA: Diagnosis not present

## 2022-02-25 DIAGNOSIS — G4733 Obstructive sleep apnea (adult) (pediatric): Secondary | ICD-10-CM

## 2022-02-25 DIAGNOSIS — I428 Other cardiomyopathies: Secondary | ICD-10-CM | POA: Diagnosis not present

## 2022-02-25 DIAGNOSIS — Z6833 Body mass index (BMI) 33.0-33.9, adult: Secondary | ICD-10-CM | POA: Diagnosis not present

## 2022-02-25 DIAGNOSIS — J449 Chronic obstructive pulmonary disease, unspecified: Secondary | ICD-10-CM

## 2022-02-25 DIAGNOSIS — R911 Solitary pulmonary nodule: Secondary | ICD-10-CM

## 2022-02-25 LAB — BASIC METABOLIC PANEL
Anion gap: 9 (ref 5–15)
BUN: 25 mg/dL — ABNORMAL HIGH (ref 8–23)
CO2: 28 mmol/L (ref 22–32)
Calcium: 9 mg/dL (ref 8.9–10.3)
Chloride: 102 mmol/L (ref 98–111)
Creatinine, Ser: 1.02 mg/dL (ref 0.61–1.24)
GFR, Estimated: >60 mL/min (ref >60)
Glucose, Bld: 105 mg/dL — ABNORMAL HIGH (ref 70–99)
Potassium: 4 mmol/L (ref 3.5–5.1)
Sodium: 139 mmol/L (ref 135–145)

## 2022-02-25 LAB — BRAIN NATRIURETIC PEPTIDE: B Natriuretic Peptide: 145.9 pg/mL — ABNORMAL HIGH (ref 0.0–100.0)

## 2022-02-25 MED ORDER — METOLAZONE 2.5 MG PO TABS: 2.5000 mg | ORAL_TABLET | Freq: Once | ORAL | 0 refills | Status: DC

## 2022-02-25 MED ORDER — TORSEMIDE 20 MG PO TABS: 40.0000 mg | ORAL_TABLET | Freq: Two times a day (BID) | ORAL | 3 refills | Status: DC

## 2022-02-25 NOTE — Telephone Encounter (Signed)
I called the patient and he reports that he was looking up and he was told per [employer] that Symbicort was the equivalent of the Daliresp.  Please advise.  ?

## 2022-02-25 NOTE — Progress Notes (Signed)
ReDS Vest / Clip - 02/25/22 1100   ? ?  ? ReDS Vest / Clip  ? Station Marker D   ? Ruler Value 36.5   ? ReDS Value Range High volume overload   ? ReDS Actual Value 43   ? ?  ?  ? ?  ? ? ?

## 2022-02-25 NOTE — Telephone Encounter (Signed)
Called and spoke with patient. He stated that he had developed headaches, dizziness and fatigue all of a sudden while on the [place]. Also noticed that he was having some bilateral leg cramping as well. He stopped the medication completely about 3 days ago and his symptoms have gone away completely.  ? ?He wanted to know if there was another medication that could replace the Daliresp as he is scared that his COPD will get worse without being on it.  ? ?Pharmacy is [facility] on [address].  ? ?RB, can you please advise? Thanks!  ?

## 2022-02-25 NOTE — Patient Instructions (Signed)
Medication Changes: ? ?Stop Lasix ? ?Start Torsemide 40 mg (2 Tabs) Twice daily for 3 days then take 40 mg daily ? ?Take Metolazone 2.5 mg tomorrow morning with an extra dose of Potassium 40 Meq ? ? ?Lab Work: ? ?Labs done today, your results will be available in MyChart, we will contact you for abnormal readings. ? ? ?Testing/Procedures: ? ?Repeat lab work in next week ? ?Referrals: ? ?none ? ?Special Instructions // Education: ? ?none ? ?Follow-Up in: 2 weeks ? ?At the [facility], you and your health needs are our priority. We have a designated team specialized in the treatment of Heart Failure. This Care Team includes your primary Heart Failure Specialized Cardiologist (physician), Advanced Practice Providers (APPs- Physician Assistants and Nurse Practitioners), and Pharmacist who all work together to provide you with the care you need, when you need it.  ? ?You may see any of the following providers on your designated Care Team at your next follow up: ? ?Dr Glori Bickers ?Dr Loralie Champagne ?Darrick Grinder, NP ?Lyda Jester, PA ?Jessica Milford,NP ?Marlyce Huge, PA ?Audry Riles, PharmD ? ? ?Please be sure to bring in all your medications bottles to every appointment.  ? ?Need to Contact us: ? ?If you have any questions or concerns before your next appointment please send [place] a message through [place] or call our office at [phone].   ? ?TO LEAVE A MESSAGE FOR THE NURSE SELECT OPTION 2, PLEASE LEAVE A MESSAGE INCLUDING: ?YOUR NAME ?DATE OF BIRTH ?CALL BACK NUMBER ?REASON FOR CALL**this is important as we prioritize the call backs ? ?YOU WILL RECEIVE A CALL BACK THE SAME DAY AS LONG AS YOU CALL BEFORE 4:00 PM ? ? ?

## 2022-02-25 NOTE — Telephone Encounter (Signed)
That is incorrect. These are 2 different types of medication.  ?

## 2022-02-25 NOTE — Telephone Encounter (Signed)
Agree with stopping the daliresp. There is not a direct substitute for this. We can discuss possibly starting daily abx at his next visit.  ?

## 2022-02-25 NOTE — Progress Notes (Signed)
ADVANCED HF CLINIC NOTE  HF MD: Dr. Haroldine Laws  Pulmonology: Dr Lamonte Sakai   Reason for Visit:  F/u for Chronic Systolic Heart Failure   HPI: [name] is a 66 y.o. male with h/o obesity, HTN, COPD with ongoing tobacco use and chronic systolic heart failure.   He underwent cath in 4/18 due to CP. Cath showed normal coronaries with EF 40% and global HK. LVEDP 19.   He saw Dr. Gwenlyn Found on 07/01/2018 because of recurrent chest pain.  Myoview stress test performed 07/21/2018 showed EF 38% inferior scar with septal ischemia.  A 2D echo performed 07/07/2018 revealed an EF of 30 to 35%.  He was referred for cardiac CT.   Cardiac CT on 9/19.  Calcium score 148 (74th percentile)  LAD 25% otherwise normal cors. Dilated pulmonary arteries suggestive of PH.   Worked for [employer]. Retired 5/21. Follows with [name] in Pulmonary Clinic. Was started on bisoprolol 2.5 but couldn't tolerate due to fatigue. Was falling asleep at work. Stopped bisoprolol and felt better.   Echo 12/20 EF improved to 45%. Mild RV dysfunction   Admitted 1/22 for acute on chronic hypoxic respiratory failure secondary to acute COPD and CHF w/ volume overload. He had massive diuresis w/ IV Lasix, diuresed down from admit wt of 265 lb to 210 lb. Echo was repeated 12/21, EF 40-45% (unchanged). RV mildly reduced, RVSP 49.   Followed in clinic, Jardiance and Arlyce Harman added - dose adjusted due to intolerances. He then stopped these on his own.   In 4/22, had near syncope, Zio showed intermittent CHB. Repeat echo EF 30-35%% Had St Jude dual chamber PM 04/16/21. Had some PMT and device adjusted by EP .   Echo 07/31/21 with EF 30-35%,  moderately reduced RV, and diffuse HK worse in inferior base.  Underwent R/L cath in 9/22 for persistent SOB. No CAD. EF 35% Mildly elevated filling pressures with normal output.  Ao =  128/75 (95) LV =  130/24 RA =  10 RV =  40/12 PA = 45/19 (32) PCW = 18  Fick cardiac output/index = 5.1/2.3 PVR  = 2.6 WU FA sat = 94% PA sat = 66%, 66%  Subsequently underwent CRT-D upgrade attempt on 10/07/21 by Dr. Curt Bears due to high-degree of RV pacing. 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   Follow-up 10/20/21.  Noted increased dyspnea. Volume up with > 10 lb weight gain. Lasix increased to 40 mg daily X 1 week then decreased back to 40 mg M, W, F.   Treated with abx and steroids for COPD exacerbation 12/22 in setting of viral illness +/- pneumonia.   Follow up 1/23 mildly volume up and Lasix increased to 60 daily, felt he diuresed better on lasix vs torsemide.  S/p Barostim 2/23.  Today he returns for an acute visit for increasing SOB and weight up 4 lbs. Saw EP this week and barostim adjusted. He was mildly volume up and advised to increase diuretics to 80 mg x 2 days. Previously not urinating much on Lasix 60 daily. Has good days and bad days with breathing, says he is having COPD flare today. Remains SOB walking on flat ground for further distances. Has occasional chest tightness. Denies abnormal bleeding, palpitations, dizziness, or PND/Orthopnea. Appetite ok. No fever or chills. Weight at home 225-227 pounds. Stopped daliresp due to persistent dizziness and joint aches, now resolved off med. Wears O2 prn, has not needed it lately. Eats bacon and a  lot of cranberry juice during the day.  Cardiac studies: Echo 06/2018 LVEF 30-35%, RV normal  Echo 12/2018 LVEF 40-45%, RV normal  Echo 11/2020 LVEF 40-45%, RV mildly reduced Echo 03/2021 30-35%, RV mildly reduced  Echo 07/2021 EF 30-35% RV moderately reduced   Review of systems complete and found to be negative unless listed in HPI.    Past Medical History:  Diagnosis Date   Adenomatous colon polyp    Allergy    Anal fissure    Arthritis    Asthma    Bronchitis    CHF (congestive heart failure) (HCC)    COPD, group D, by GOLD 2017 classification ([place])    Dyspnea    Emphysema lung (HCC)    GERD  (gastroesophageal reflux disease)    History of hiatal hernia    Hyperlipidemia    Hypertension    NICM (nonischemic cardiomyopathy) (HCC)    OSA (obstructive sleep apnea)    Pneumonia    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   Requires supplemental oxygen    Sinusitis    SOB (shortness of breath)     Current Outpatient Medications  Medication Sig Dispense Refill   albuterol (PROVENTIL) (2.5 MG/3ML) 0.083% nebulizer solution Take 3 mLs (2.5 mg total) by nebulization every 4 (four) hours as needed for wheezing or shortness of breath. 75 mL 11   albuterol (VENTOLIN HFA) 108 (90 Base) MCG/ACT inhaler INHALE 2 PUFFS INTO THE LUNGS EVERY 6 HOURS AS NEEDED FOR WHEEZING 18 g 2   allopurinol (ZYLOPRIM) 300 MG tablet Take 1 tablet (300 mg total) by mouth daily. 90 tablet 0   ALPRAZolam (XANAX) 0.25 MG tablet Take 1 tablet (0.25 mg total) by mouth 3 (three) times daily. 90 tablet 2   AMBULATORY NON FORMULARY MEDICATION Medication Name: Nitroglycerin gel 0.125% apply 2-3 times daily to the anal canal. (Patient taking differently: Place 1 application. rectally in the morning, at noon, and at bedtime. Medication Name: Nitroglycerin gel 0.125% apply 2-3 times daily to the anal canal.) 30 g 1   aspirin EC 81 MG tablet Take 81 mg by mouth daily. Swallow whole.     Aspirin-Salicylamide-Caffeine (ARTHRITIS STRENGTH BC POWDER PO) Take 0.5 packets by mouth daily.     cholecalciferol (VITAMIN D3) 25 MCG (1000 UNIT) tablet Take 1,000 Units by mouth daily.     colchicine 0.6 MG tablet Take 0.6 mg by mouth daily as needed.     EPINEPHrine 0.3 mg/0.3 mL IJ SOAJ injection Inject 0.3 mg into the muscle as needed for anaphylaxis.     fluticasone (FLONASE) 50 MCG/ACT nasal spray Place 1 spray into both nostrils daily.     furosemide (LASIX) 40 MG tablet Take 1.5 tablets (60 mg total) by mouth daily. 45 tablet 6   Glucosamine HCl (GLUCOSAMINE PO) Take 1 tablet by mouth daily.     guaiFENesin (MUCINEX)  600 MG 12 hr tablet Take 1 tablet (600 mg total) by mouth 2 (two) times daily. 60 tablet 5   ipratropium (ATROVENT) 0.06 % nasal spray Place 2 sprays into both nostrils 4 (four) times daily. 15 mL 3   Lidocaine, Anorectal, 5 % CREA Apply 1 application topically 2 (two) times daily. 30 g 1   losartan (COZAAR) 25 MG tablet Take 0.5 tablets (12.5 mg total) by mouth at bedtime. 15 tablet 3   mometasone-formoterol (DULERA) 100-5 MCG/ACT AERO Inhale 2 puffs into the lungs 2 (two) times daily.     montelukast (SINGULAIR)  10 MG tablet Take 10 mg by mouth at bedtime.     nicotine (NICODERM CQ - DOSED IN MG/24 HOURS) 14 mg/24hr patch Place 1 patch (14 mg total) onto the skin daily. 28 patch 0   omeprazole (PRILOSEC) 20 MG capsule Take 1 capsule (20 mg total) by mouth 2 (two) times daily before a meal. ACID REFLUX 180 capsule 1   polyethylene glycol (MIRALAX / GLYCOLAX) 17 g packet Take 17 g by mouth daily as needed. 14 each 0   potassium chloride SA (KLOR-CON M20) 20 MEQ tablet Take 1 tablet (20 mEq total) by mouth daily. 90 tablet 3   predniSONE (DELTASONE) 10 MG tablet TAKE 1 TABLET(10 MG) BY MOUTH DAILY WITH BREAKFAST 30 tablet 5   predniSONE (DELTASONE) 20 MG tablet Take 1 tablet (20 mg total) by mouth daily with breakfast. 5 tablet 0   Tiotropium Bromide Monohydrate (SPIRIVA RESPIMAT) 2.5 MCG/ACT AERS Inhale 2 puffs into the lungs daily. 4 g 4   traMADol (ULTRAM) 50 MG tablet Take 1 tablet (50 mg total) by mouth every 8 (eight) hours as needed. 90 tablet 2   vitamin B-12 (CYANOCOBALAMIN) 1000 MCG tablet Take 1,000 mcg by mouth daily.     DALIRESP 500 MCG TABS tablet Take 0.5 tablets (250 mcg total) by mouth 2 (two) times daily. (Patient not taking: Reported on 02/25/2022) 30 tablet 5   No current facility-administered medications for this encounter.   Allergies  Allergen Reactions   Spironolactone Anaphylaxis   Clindamycin Other (See Comments)    Tongue swelling   Doxycycline     Severe stomach  upset per patient   E-Mycin [Erythromycin Base] Swelling   Jardiance [Empagliflozin] Nausea Only and Other (See Comments)    Lightheadness Dizziness   Penicillins Swelling and Other (See Comments)    Childhood rxn--MD stated he "almost died" Has patient had a PCN reaction causing immediate rash, facial/tongue/throat swelling, SOB or lightheadedness with hypotension:Yes Has patient had a PCN reaction causing severe rash involving mucus membranes or skin necrosis:unsure Has patient had a PCN reaction that required hospitalization:unsure Has patient had a PCN reaction occurring within the last 10 years:No If all of the above answers are "NO", then may proceed with Cephalosporin use.     Rosuvastatin     Myalgias (intolerance)   Social History   Socioeconomic History   Marital status: Single    Spouse name: Not on file   Number of children: 1   Years of education: 12   Highest education level: Not on file  Occupational History   Occupation: disabled  Tobacco Use   Smoking status: Former    Packs/day: 2.00    Years: 46.00    Pack years: 92.00    Types: Cigarettes    Quit date: 01/27/2022    Years since quitting: 0.0   Smokeless tobacco: Never   Tobacco comments:    Smoking very little if he even has one in a week.  Does not even want one.  Vaping Use   Vaping Use: Never used  Substance and Sexual Activity   Alcohol use: Not Currently    Alcohol/week: 28.0 standard drinks    Types: 28 Cans of beer per week   Drug use: No   Sexual activity: Yes    Partners: Female    Birth control/protection: None  Other Topics Concern   Not on file  Social History Narrative   Right handed   Tea sometimes and coffee (1/2 caffeine)   Social Determinants  of Health   Financial Resource Strain: Medium Risk   Difficulty of Paying Living Expenses: Somewhat hard  Food Insecurity: No Food Insecurity   Worried About Running Out of Food in the Last Year: Never true   Ran Out of Food in the  Last Year: Never true  Transportation Needs: No Transportation Needs   Lack of Transportation (Medical): No   Lack of Transportation (Non-Medical): No  Physical Activity: Not on file  Stress: Not on file  Social Connections: Not on file  Intimate Partner Violence: Not on file   Family History  Problem Relation Age of Onset   Lung cancer Father    High blood pressure Mother    Diabetes Maternal Grandmother    Colon polyps Neg Hx    Esophageal cancer Neg Hx    Pancreatic cancer Neg Hx    Stomach cancer Neg Hx    Wt Readings from Last 3 Encounters:  02/25/22 105.3 kg (232 lb 3.2 oz)  02/16/22 106.1 kg (233 lb 12.8 oz)  02/10/22 102.5 kg (226 lb)   BP 130/88    Pulse 85    Wt 105.3 kg (232 lb 3.2 oz)    SpO2 95%    BMI 33.32 kg/m   PHYSICAL EXAM: General:  NAD. No resp difficulty HEENT: Normal Neck: Supple. Thick neck. Carotids 2+ bilat; no bruits. No lymphadenopathy or thryomegaly appreciated. Cor: PMI nondisplaced. Regular rate & rhythm. No rubs, gallops or murmurs. Lungs: Crackles LLL, + rhonchi RLL Abdomen: Obese, nontender, +distended. No hepatosplenomegaly. No bruits or masses. Good bowel sounds. Extremities: No cyanosis, clubbing, rash, 2+ BLE edema to  knees L>R Neuro: Alert & oriented x 3, cranial nerves grossly intact. Moves all 4 extremities w/o difficulty. Affect pleasant.  Device interrogation: no AF, VP 67% AP 13% (personally reviewed).  ReDs: 43%  ASSESSMENT & PLAN: 1. Acute on chronic systolic HF due to NICM  - cath 4/18 normal cors. EF 48%  - Echo 7/19  EF 30-35%. RV normal.  - Cardiac CT 9/19.  Calcium score 148 (74th percentile)  LAD 25% otherwise normal cors. Dilated pulmonary arteries suggestive of PH.  - Suspect NICM due to HTN and inadequately treated OSA (He now reports improved nightly compliance w/ CPAP). - Echo 1/22 EF 40-45%. RV mildly reduced. RVSP 49 - Admit 4/22 for symptomatic bradycardia/CHF. Echo LVEF 30%. RV mildly reduced - Echo 07/31/21  EF 30-35 % and RV moderately reduced.  - R/LHC 9/22 No CAD EF 35%, Mildly elevated filling pressures with normal output - Failed upgrade to CRT-D due to 99% RV pacing. However RV pacing significantly decreased.  - Chronically NYHA Class III, functional class difficult due to COPD and obesity/ deconditioning.  - Barostim adjusted per EP. - He is volume overloaded on exam, REDs 43%. Suspect 6-8 lbs of fluid on board. - Stop Lasix. - Start torsemide 40 mg bid x 3 days, then 40 mg daily thereafter. - Take metolazone 2.5 mg x 1 with extra 40 KCL (OK to take tomorrow AM to avoid nocturia). - Continue Losartan 12.5 mg daily (failed Entresto due to dizziness/low BP) - No Arlyce Harman (H/o Anaphylaxis).  - Intolerant to Jardiance (positional dizziness and nausea).  - Intolerant bisoprolol due to fatigue, coreg due to dizziness, and toprol due to dry mouth.  - Given, CHB, biventricular dysfunction, moderate LVH and inability to tolerate HF meds due to orthostasis, there is concern about infiltrative CM (sarcoid, amyloid). cMRI considered but deferred at patient request.  (device MRI compatible  per EP but shadowing may make scan less diagnostic).  - Multiple myeloma panel negative. He does not want to do PYP.  - Remain symptomatic out of proportion to EF, hemodynamics and volume status. Suspect limitation is multfactorial.  - Continue daily weights and limit eating out. - BMET/BNP today, BMET in 1 week. - Follow up with APP next week to check volume.  2. CHB:  - s/p PPM 4/22 - followed by Dr. Curt Bears  - Failed CRT-D upgrade 10/22 due to subclavian stenosis.  3. HTN - Controlled on current regimen.    4. COPD  - GOLD Group D.  - Follows with Pulmonary ([name]).  - Home O2 PRN - No longer smoking.  - Continue BiPAP  - I asked him to let Pulm know he has stopped his daliresp  5. OSA - Continue Bipap.  6. Morbid obesity  - I suspect this is a big part of his functional limitation/DOE. - Body  mass index is 33.32 kg/m.  7. Pulmonary Nodules - Followed by [name] - CT scan 3/22 stable, annual screening advised.   Follow up with APP next week (REDs, BMET, ?CXR) and with Dr. Haroldine Laws next month as scheduled.  Rafael Bihari, FNP 11:22 AM

## 2022-02-25 NOTE — Telephone Encounter (Signed)
Called and spoke with patient. He verbalized understanding. ? ?Nothing further needed at time of call.  ?

## 2022-02-27 ENCOUNTER — Other Ambulatory Visit: Payer: Self-pay

## 2022-02-27 ENCOUNTER — Encounter: Payer: Self-pay | Admitting: Emergency Medicine

## 2022-02-27 ENCOUNTER — Ambulatory Visit (INDEPENDENT_AMBULATORY_CARE_PROVIDER_SITE_OTHER): Payer: 59 | Admitting: Emergency Medicine

## 2022-02-27 DIAGNOSIS — J9611 Chronic respiratory failure with hypoxia: Secondary | ICD-10-CM

## 2022-02-27 DIAGNOSIS — I5042 Chronic combined systolic (congestive) and diastolic (congestive) heart failure: Secondary | ICD-10-CM

## 2022-02-27 DIAGNOSIS — J302 Other seasonal allergic rhinitis: Secondary | ICD-10-CM | POA: Diagnosis not present

## 2022-02-27 DIAGNOSIS — F17210 Nicotine dependence, cigarettes, uncomplicated: Secondary | ICD-10-CM

## 2022-02-27 DIAGNOSIS — J449 Chronic obstructive pulmonary disease, unspecified: Secondary | ICD-10-CM | POA: Diagnosis not present

## 2022-02-27 MED ORDER — ROFLUMILAST 500 MCG PO TABS: 250.0000 ug | ORAL_TABLET | Freq: Every day | ORAL | 11 refills | Status: DC

## 2022-02-27 NOTE — Assessment & Plan Note (Signed)
Continue trilogy ventilator with supplemental oxygen at night.  He does not have daytime desaturations. ?

## 2022-02-27 NOTE — Assessment & Plan Note (Signed)
Continue prednisone 10 mg daily ?Continue Spiriva and Dulera as you have been taking them. ?Continue Singulair 10 mg each evening. ?Try decreasing your Daliresp to 250 mg once daily.  We will send a new prescription for this dose.  If you still have side effects then try splitting it into 125 mg twice a day to see if this is easier to tolerate. ?We talked about possibly adding azithromycin once daily.  We will hold off for now since you have not tolerated erythromycin in the past. ?Follow with Dr Lamonte Sakai in 3 months or sooner if you have any problems. ?

## 2022-02-27 NOTE — Progress Notes (Signed)
? ? ?Subjective:  ?Patient ID: Robert Church, male    DOB: 1956/06/06  Age: 66 y.o. MRN: [id number] ? ?CC:  ?Chief Complaint  ?Patient presents with  ? Follow-up  ?  Increased SOB/ wheezing/ cough   ? ? ?HPI ? ?ROV 12/17/21 --66 year old man with severe COPD and chronic bronchitic phenotype, chronic rhinitis.  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 ?He still smokes occasionally, but has not since 1 weeks ago.  He is on Trilogy +4 L/min, has used it 93% of the nights for the last month ?Bronchodilator regimen is Spiriva, Dulera. He uses albuterol nebs 3-4x a day.  ?He is on prednisone 10, Daliresp, Singulair.  ?He states that he has determined that he did not have a reaction to levaquin, he was on spironolactone at the same time.   ?He is having a lot of clear mucous, clears it in the am ? ? ?ROV 02/27/22 --Robert Church is a 66 year old man with history of tobacco use, severe COPD with chronic bronchitic phenotype, associated chronic hypoxemic respiratory failure for which he is on supplemental oxygen and a trilogy ventilator at night.  He occasionally uses O2 w exertion, does not see any desaturations on RA.  ?Also with CAD and ischemic cardiomyopathy with third-degree heart block and an ICD pacemaker, hypertension.  Underwent implantable Barostim for his CHF 01/2022 and diuretics are being titrated. ?Maintenance regimen prednisone 10 mg, Spiriva, Dulera, Singulair. He was on daliresp but began to have trouble - dizziness, leg pain, HA. He stopped the daliresp and these sx improved. He has been taking '250mg'$  bid. He is having exertional SOB. He coughs, usually at night and coughs up clear mucous. Still able to do some yard work.  ?Flonase, atrovent NS. Uses albuterol 4-5x a day. He doesn't tolerate erythromycin, is hesitant to start azithro.  ? ? ? ?ROS ?Review of Systems  ?Constitutional:  Negative  for activity change, fatigue, fever and unexpected weight change.  ?HENT:  Negative for congestion, postnasal drip, rhinorrhea and sinus pain.   ?Eyes:  Negative for redness.  ?Respiratory:  Positive for cough, shortness of breath and wheezing. Negative for apnea, choking, chest tightness and stridor.   ?Cardiovascular:  Negative for chest pain, palpitations and leg swelling.  ?Gastrointestinal:  Negative for abdominal distention, abdominal pain, diarrhea and nausea.  ?Endocrine: Negative for polyuria.  ?Genitourinary:  Negative for dysuria and hematuria.  ?Musculoskeletal:  Negative for back pain, gait problem and myalgias.  ?Skin:  Negative for rash and wound.  ?Neurological:  Negative for dizziness, seizures, weakness and headaches.  ?Psychiatric/Behavioral:  Negative for agitation and self-injury.   ? ? ?Objective:  ? ?Today's Vitals: BP 134/84 (BP Location: Right Arm, Patient Position: Sitting, Cuff Size: Normal)   Pulse 84   Temp 98.2 ?F (36.8 ?C) (Oral)   Ht '5\' 10"'$  (1.778 m)   Wt 229 lb (103.9 kg)   SpO2 94%   BMI 32.86 kg/m?  ? ?Gen: Pleasant, well-nourished, in no distress,  normal affect ? ?ENT: No lesions,  mouth clear,  oropharynx clear, no postnasal drip ? ?Neck: No JVD, no stridor ? ?Lungs: No use of accessory muscles, distant, no crackles, B exp wheezes.  ? ?Cardiovascular: RRR, heart sounds normal, no murmur or gallops, no peripheral edema ? ?Musculoskeletal: No deformities, no cyanosis or clubbing ? ?Neuro: alert, awake, non focal ? ?Skin: Warm, no lesions or rash ? ? ?Assessment &  Plan:  ?COPD, group D, by GOLD 2017 classification ([place]) ?Continue prednisone 10 mg daily ?Continue Spiriva and Dulera as you have been taking them. ?Continue Singulair 10 mg each evening. ?Try decreasing your Daliresp to 250 mg once daily.  We will send a new prescription for this dose.  If you still have side effects then try splitting it into 125 mg twice a day to see if this is easier to tolerate. ?We talked  about possibly adding azithromycin once daily.  We will hold off for now since you have not tolerated erythromycin in the past. ?Follow with Dr Lamonte Sakai in 3 months or sooner if you have any problems. ? ?Chronic respiratory failure with hypoxia ([place]) ?Continue trilogy ventilator with supplemental oxygen at night.  He does not have daytime desaturations. ? ?Seasonal allergic rhinitis ?Continue your Flonase nasal spray, Atrovent nasal spray as you have been using them. ? ?Chronic combined systolic and diastolic CHF (congestive heart failure) ([place]) ?Continue to follow with cardiology and continue your cardiac medications. ? ?Cigarette smoker ?Work hard to stop smoking completely. ? ? ? ?Baltazar Apo, MD, PhD ?02/27/2022, 3:21 PM ?[facility] ?[phone] or if no answer [phone] ? ? ? ?

## 2022-02-27 NOTE — Patient Instructions (Signed)
Continue Trilogy vent + 4L/min oxygen at night while sleeping.  ?Continue prednisone 10 mg daily ?Continue Spiriva and Dulera as you have been taking them. ?Continue Singulair 10 mg each evening. ?Try decreasing your Daliresp to 250 mg once daily.  We will send a new prescription for this dose.  If you still have side effects then try splitting it into 125 mg twice a day to see if this is easier to tolerate. ?We talked about possibly adding azithromycin once daily.  We will hold off for now since you have not tolerated erythromycin in the past. ?Continue your Flonase nasal spray, Atrovent nasal spray as you have been using them. ?Continue to follow with cardiology and continue your cardiac medications. ?Work hard to stop smoking completely. ?Follow with Dr Lamonte Sakai in 3 months or sooner if you have any problems. ? ?

## 2022-02-27 NOTE — Addendum Note (Signed)
Addended by: Gavin Potters R on: 02/27/2022 03:34 PM ? ? Modules accepted: Orders ? ?

## 2022-02-27 NOTE — Assessment & Plan Note (Signed)
Continue your Flonase nasal spray, Atrovent nasal spray as you have been using them. ?

## 2022-02-27 NOTE — Assessment & Plan Note (Signed)
Continue to follow with cardiology and continue your cardiac medications. ?

## 2022-02-27 NOTE — Assessment & Plan Note (Signed)
Work hard to stop smoking completely. ?

## 2022-03-02 ENCOUNTER — Telehealth: Payer: Self-pay | Admitting: Emergency Medicine

## 2022-03-02 MED ORDER — ROFLUMILAST 250 MCG PO TABS: 1.0000 | ORAL_TABLET | Freq: Every day | ORAL | 11 refills | Status: DC

## 2022-03-02 NOTE — Telephone Encounter (Signed)
Spoke with the pt ?He is requesting rx for the 250 mcg daliresp  ?Rx sent based off of last AVS ?Nothing further needed ?

## 2022-03-03 ENCOUNTER — Ambulatory Visit (INDEPENDENT_AMBULATORY_CARE_PROVIDER_SITE_OTHER): Payer: 59

## 2022-03-03 ENCOUNTER — Other Ambulatory Visit: Payer: Self-pay

## 2022-03-03 ENCOUNTER — Ambulatory Visit (INDEPENDENT_AMBULATORY_CARE_PROVIDER_SITE_OTHER): Payer: 59 | Admitting: Orthopaedic Surgery

## 2022-03-03 ENCOUNTER — Encounter: Payer: Self-pay | Admitting: Orthopaedic Surgery

## 2022-03-03 VITALS — Ht 70.0 in | Wt 225.0 lb

## 2022-03-03 DIAGNOSIS — M546 Pain in thoracic spine: Secondary | ICD-10-CM

## 2022-03-03 DIAGNOSIS — M542 Cervicalgia: Secondary | ICD-10-CM

## 2022-03-03 NOTE — Addendum Note (Signed)
Addended by: Lendon Collar on: 03/03/2022 03:35 PM ? ? Modules accepted: Orders ? ?

## 2022-03-03 NOTE — Progress Notes (Signed)
? ?Office Visit Note ?  ?Patient: Robert Church           ?Date of Birth: Jan 20, 1956           ?MRN: [id number] ?Visit Date: 03/03/2022 ?             ?Requested by: Janith Lima, MD ?[place],  [place] [id number] ?PCP: Janith Lima, MD ? ? ?Assessment & Plan: ?Visit Diagnoses:  ?1. Neck pain   ?2. Pain in thoracic spine   ? ? ?Plan: 66 year old gentleman with complicated medical history including congestive heart failure, advanced COPD, has pacemaker as well as a carotid implant to help with circulation.  He has multiple with orthopedic issues.  He was seen and evaluated by [facility] for lower back pain.  He said this began after he blacked out from his heart condition in 2021.  [facility] did obtain a CT scan as he cannot have an MRI because of his pacemaker.  They did try 1 injection which helped but only for short period of time.  This time time he is complaining of neck and scapular pain.  He has not really had this fully evaluated.  Accompanying this is numbness of his ring and pinky's bilaterally.  He did have some reproduction of the symptoms with palpation over the ulnar nerve.  We will refer him to Dr. Ernestina Patches for both electrodiagnostic studies of the upper extremities and addressing another epidural steroid injection into his lower back ? ?Follow-Up Instructions: No follow-ups on file.  ? ?Orders:  ?Orders Placed This Encounter  ?Procedures  ? XR Thoracic Spine 2 View  ? XR Cervical Spine 2 or 3 views  ? ?No orders of the defined types were placed in this encounter. ? ? ? ? Procedures: ?No procedures performed ? ? ?Clinical Data: ?No additional findings. ? ? ?Subjective: ?Chief Complaint  ?Patient presents with  ? Spine - Pain  ?Patient presents today for back pain. He said that he took multiple falls in 2021 due to a heart condition. He has been going to [facility] for treatments of his lower back, but his insurance has changed and they no longer accept it. He is here today for  treatment of his lower and upper back. He said that he has received an injection in his lower back with Dr.Ramos and it helped for only a week. He does report some occasional pain down his left leg. His main concern today is his upper back. He points to the area between his scapula. He has numbness in both hands bilaterally. He states that there has been no treatment for his upper back pain. He takes Goody powders for pain relief.  ? ? ? ?Review of Systems  ?All other systems reviewed and are negative. ? ? ?Objective: ?Vital Signs: There were no vitals taken for this visit. ? ?Physical Exam ?Vitals reviewed.  ?Pulmonary:  ?   Effort: Pulmonary effort is normal.  ?Skin: ?   General: Skin is warm and dry.  ? ? ?Ortho Exam ?Examination of his upper back he has no specific tenderness to palpation over the cervical or thoracic spine.  No step-offs no redness no erythema he has excellent strength with resisted abduction extension flexion of his arms grip strength is good.  He does have good strength in his digits without any atrophy.  Altered sensation of his ring finger and pinky bilaterally.  Pulses are intact.  Does have reproducible symptoms with with a positive  Tinel's over the ulnar nerve in the elbow. ?Specialty Comments:  ?No specialty comments available. ? ?Imaging: ?No results found. ? ? ?PMFS History: ?Patient Active Problem List  ? Diagnosis Date Noted  ? Rectal pain 01/08/2022  ? Preop pulmonary/respiratory exam 12/04/2021  ? Chronic respiratory failure with hypoxia ([place]) 11/24/2021  ? Exposure to the flu 11/20/2021  ? Flu-like symptoms 11/20/2021  ? Encounter for general adult medical examination with abnormal findings 08/20/2021  ? GAD (generalized anxiety disorder) 07/04/2021  ? Degenerative disc disease, cervical 04/21/2021  ? AV block, 3rd degree ([place]) 04/16/2021  ? Carotid artery calcification 04/09/2021  ? Panlobular emphysema ([place]) 03/25/2021  ? Atherosclerosis of aorta ([place]) 03/25/2021  ? Body mass  index (BMI) 32.0-32.9, adult 01/15/2021  ? Lumbar spondylosis 01/10/2021  ? Foraminal stenosis of cervical region 01/10/2021  ? Chronic bilateral low back pain without sciatica 01/09/2021  ? Type II diabetes mellitus with manifestations ([place]) 11/29/2020  ? Primary osteoarthritis of both knees 09/24/2020  ? Idiopathic chronic gout of foot without tophus 08/08/2020  ? Yellow jacket sting allergy 08/08/2020  ? Class 2 severe obesity due to excess calories with serious comorbidity and body mass index (BMI) of 38.0 to 38.9 in adult [facility]) 08/05/2020  ? Hyperlipidemia LDL goal <70 07/11/2020  ? Hypogonadism male 07/08/2020  ? Hypertriglyceridemia 07/08/2020  ? Routine general medical examination at a health care facility 07/08/2020  ? Umbilical hernia without obstruction and without gangrene 07/08/2020  ? Chronic combined systolic and diastolic CHF (congestive heart failure) ([place]) 01/09/2020  ? Solitary pulmonary nodule 08/15/2019  ? Anal fissure 06/07/2019  ? NICM (nonischemic cardiomyopathy) ([place]) 12/16/2018  ? History of adenomatous polyp of colon 11/25/2018  ? Cigarette smoker 01/16/2015  ? COPD, group D, by GOLD 2017 classification ([place]) 03/03/2013  ? Obstructive sleep apnea syndrome 01/05/2013  ? Asbestos exposure 01/05/2013  ? Seasonal allergic rhinitis 10/06/2012  ? GERD (gastroesophageal reflux disease) 06/05/2012  ? Intrinsic asthma 07/23/2011  ? HTN (hypertension) 07/23/2011  ? ?Past Medical History:  ?Diagnosis Date  ? Adenomatous colon polyp   ? Allergy   ? Anal fissure   ? Arthritis   ? Asthma   ? Bronchitis   ? CHF (congestive heart failure) ([place])   ? COPD, group D, by GOLD 2017 classification ([place])   ? Dyspnea   ? Emphysema lung ([place])   ? GERD (gastroesophageal reflux disease)   ? History of hiatal hernia   ? Hyperlipidemia   ? Hypertension   ? NICM (nonischemic cardiomyopathy) ([place])   ? OSA (obstructive sleep apnea)   ? Pneumonia   ? Presence of permanent cardiac pacemaker   ? St Jude  ? Requires  supplemental oxygen   ? Sinusitis   ? SOB (shortness of breath)   ?  ?Family History  ?Problem Relation Age of Onset  ? Lung cancer Father   ? High blood pressure Mother   ? Diabetes Maternal Grandmother   ? Colon polyps Neg Hx   ? Esophageal cancer Neg Hx   ? Pancreatic cancer Neg Hx   ? Stomach cancer Neg Hx   ?  ?Past Surgical History:  ?Procedure Laterality Date  ? BIV UPGRADE N/A 10/07/2021  ? Procedure: BIV PPM UPGRADE;  Surgeon: Constance Haw, MD;  Location: [facility];  Service: Cardiovascular;  Laterality: N/A;  ? LEFT HEART CATH AND CORONARY ANGIOGRAPHY N/A 04/19/2017  ? Procedure: Left Heart Cath and Coronary Angiography;  Surgeon: Belva Crome, MD;  Location:  [facility];  Service: Cardiovascular;  Laterality: N/A;  ? PACEMAKER IMPLANT N/A 04/16/2021  ? Procedure: PACEMAKER IMPLANT;  Surgeon: Constance Haw, MD;  Location: [facility];  Service: Cardiovascular;  Laterality: N/A;  ? RIGHT/LEFT HEART CATH AND CORONARY ANGIOGRAPHY N/A 08/22/2021  ? Procedure: RIGHT/LEFT HEART CATH AND CORONARY ANGIOGRAPHY;  Surgeon: Jolaine Artist, MD;  Location: [facility];  Service: Cardiovascular;  Laterality: N/A;  ? TONSILLECTOMY    ? ?Social History  ? ?Occupational History  ? Occupation: disabled  ?Tobacco Use  ? Smoking status: Former  ?  Packs/day: 2.00  ?  Years: 46.00  ?  Pack years: 92.00  ?  Types: Cigarettes  ?  Quit date: 01/27/2022  ?  Years since quitting: 0.0  ? Smokeless tobacco: Never  ? Tobacco comments:  ?  Had 3 cigarettes in past 2 weeks ARJ 02/27/22  ?Vaping Use  ? Vaping Use: Never used  ?Substance and Sexual Activity  ? Alcohol use: Not Currently  ?  Alcohol/week: 28.0 standard drinks  ?  Types: 28 Cans of beer per week  ? Drug use: No  ? Sexual activity: Yes  ?  Partners: Female  ?  Birth control/protection: None  ? ? ? ? ? ? ?

## 2022-03-04 ENCOUNTER — Ambulatory Visit (HOSPITAL_COMMUNITY)
Admission: RE | Admit: 2022-03-04 | Discharge: 2022-03-04 | Disposition: A | Discharge status: Home / Self Care | Payer: 59 | Source: Ambulatory Visit | Attending: Internal Medicine | Admitting: Internal Medicine

## 2022-03-04 ENCOUNTER — Ambulatory Visit: Payer: 59 | Admitting: Nurse Practitioner

## 2022-03-04 DIAGNOSIS — I5042 Chronic combined systolic (congestive) and diastolic (congestive) heart failure: Secondary | ICD-10-CM | POA: Insufficient documentation

## 2022-03-04 LAB — BASIC METABOLIC PANEL
Anion gap: 14 (ref 5–15)
BUN: 20 mg/dL (ref 8–23)
CO2: 30 mmol/L (ref 22–32)
Calcium: 9.2 mg/dL (ref 8.9–10.3)
Chloride: 96 mmol/L — ABNORMAL LOW (ref 98–111)
Creatinine, Ser: 1.08 mg/dL (ref 0.61–1.24)
GFR, Estimated: >60 mL/min (ref >60)
Glucose, Bld: 166 mg/dL — ABNORMAL HIGH (ref 70–99)
Potassium: 4.5 mmol/L (ref 3.5–5.1)
Sodium: 140 mmol/L (ref 135–145)

## 2022-03-09 ENCOUNTER — Ambulatory Visit (HOSPITAL_COMMUNITY)
Admission: RE | Admit: 2022-03-09 | Discharge: 2022-03-09 | Disposition: A | Discharge status: Home / Self Care | Payer: 59 | Source: Ambulatory Visit | Attending: Family Medicine | Admitting: Family Medicine

## 2022-03-09 ENCOUNTER — Other Ambulatory Visit: Payer: Self-pay

## 2022-03-09 ENCOUNTER — Encounter (HOSPITAL_COMMUNITY): Payer: Self-pay

## 2022-03-09 VITALS — BP 100/66 | HR 77 | Wt 230.2 lb

## 2022-03-09 DIAGNOSIS — I5042 Chronic combined systolic (congestive) and diastolic (congestive) heart failure: Secondary | ICD-10-CM

## 2022-03-09 DIAGNOSIS — Z7901 Long term (current) use of anticoagulants: Secondary | ICD-10-CM | POA: Insufficient documentation

## 2022-03-09 DIAGNOSIS — G4733 Obstructive sleep apnea (adult) (pediatric): Secondary | ICD-10-CM | POA: Insufficient documentation

## 2022-03-09 DIAGNOSIS — I5022 Chronic systolic (congestive) heart failure: Secondary | ICD-10-CM | POA: Diagnosis not present

## 2022-03-09 DIAGNOSIS — F17210 Nicotine dependence, cigarettes, uncomplicated: Secondary | ICD-10-CM | POA: Insufficient documentation

## 2022-03-09 DIAGNOSIS — J449 Chronic obstructive pulmonary disease, unspecified: Secondary | ICD-10-CM

## 2022-03-09 DIAGNOSIS — Z79899 Other long term (current) drug therapy: Secondary | ICD-10-CM | POA: Diagnosis not present

## 2022-03-09 DIAGNOSIS — Z950 Presence of cardiac pacemaker: Secondary | ICD-10-CM | POA: Diagnosis not present

## 2022-03-09 DIAGNOSIS — J440 Chronic obstructive pulmonary disease with acute lower respiratory infection: Secondary | ICD-10-CM | POA: Diagnosis not present

## 2022-03-09 DIAGNOSIS — I10 Essential (primary) hypertension: Secondary | ICD-10-CM | POA: Diagnosis not present

## 2022-03-09 DIAGNOSIS — R911 Solitary pulmonary nodule: Secondary | ICD-10-CM

## 2022-03-09 DIAGNOSIS — I428 Other cardiomyopathies: Secondary | ICD-10-CM | POA: Diagnosis not present

## 2022-03-09 DIAGNOSIS — Z6833 Body mass index (BMI) 33.0-33.9, adult: Secondary | ICD-10-CM | POA: Insufficient documentation

## 2022-03-09 DIAGNOSIS — R0602 Shortness of breath: Secondary | ICD-10-CM | POA: Diagnosis present

## 2022-03-09 DIAGNOSIS — I110 Hypertensive heart disease with heart failure: Secondary | ICD-10-CM | POA: Insufficient documentation

## 2022-03-09 DIAGNOSIS — I442 Atrioventricular block, complete: Secondary | ICD-10-CM | POA: Insufficient documentation

## 2022-03-09 DIAGNOSIS — Z9989 Dependence on other enabling machines and devices: Secondary | ICD-10-CM | POA: Diagnosis not present

## 2022-03-09 LAB — BASIC METABOLIC PANEL
Anion gap: 10 (ref 5–15)
BUN: 22 mg/dL (ref 8–23)
CO2: 29 mmol/L (ref 22–32)
Calcium: 9.1 mg/dL (ref 8.9–10.3)
Chloride: 99 mmol/L (ref 98–111)
Creatinine, Ser: 1.01 mg/dL (ref 0.61–1.24)
GFR, Estimated: >60 mL/min (ref >60)
Glucose, Bld: 137 mg/dL — ABNORMAL HIGH (ref 70–99)
Potassium: 4.1 mmol/L (ref 3.5–5.1)
Sodium: 138 mmol/L (ref 135–145)

## 2022-03-09 LAB — URIC ACID: Uric Acid, Serum: 12 mg/dL — ABNORMAL HIGH (ref 3.7–8.6)

## 2022-03-09 LAB — BRAIN NATRIURETIC PEPTIDE: B Natriuretic Peptide: 138.3 pg/mL — ABNORMAL HIGH (ref 0.0–100.0)

## 2022-03-09 NOTE — Progress Notes (Signed)
ReDS Vest / Clip - 03/09/22 1200   ? ?  ? ReDS Vest / Clip  ? Station Marker D   ? Ruler Value 36   ? ReDS Value Range Moderate volume overload   ? ReDS Actual Value 37   ? ?  ?  ? ?  ? ? ?

## 2022-03-09 NOTE — Progress Notes (Signed)
? ?ADVANCED HF CLINIC NOTE ? ?HF MD: Dr. Haroldine Laws  ?Pulmonology: Dr Lamonte Sakai  ? ?Reason for Visit:  F/u for Chronic Systolic Heart Failure  ? ?HPI: ?[name] is a 66 y.o. male with h/o obesity, HTN, COPD with ongoing tobacco use and chronic systolic heart failure.  ? ?He underwent cath in 4/18 due to CP. Cath showed normal coronaries with EF 40% and global HK. LVEDP 19.  ? ?He saw Dr. Gwenlyn Found on 07/01/2018 because of recurrent chest pain.  Myoview stress test performed 07/21/2018 showed EF 38% inferior scar with septal ischemia.  A 2D echo performed 07/07/2018 revealed an EF of 30 to 35%.  He was referred for cardiac CT.  ? ?Cardiac CT on 9/19.  Calcium score 148 (74th percentile)  LAD 25% otherwise normal cors. Dilated pulmonary arteries suggestive of PH.  ? ?Worked for [employer]. Retired 5/21. Follows with [name] in Pulmonary Clinic. Was started on bisoprolol 2.5 but couldn't tolerate due to fatigue. Was falling asleep at work. Stopped bisoprolol and felt better.  ? ?Echo 12/20 EF improved to 45%. Mild RV dysfunction  ? ?Admitted 1/22 for acute on chronic hypoxic respiratory failure secondary to acute COPD and CHF w/ volume overload. He had massive diuresis w/ IV Lasix, diuresed down from admit wt of 265 lb to 210 lb. Echo was repeated 12/21, EF 40-45% (unchanged). RV mildly reduced, RVSP 49.  ? ?Followed in clinic, Jardiance and Arlyce Harman added - dose adjusted due to intolerances. He then stopped these on his own.  ? ?In 4/22, had near syncope, Zio showed intermittent CHB. Repeat echo EF 30-35%% Had St Jude dual chamber PM 04/16/21. Had some PMT and device adjusted by EP .  ? ?Echo 07/31/21 with EF 30-35%,  moderately reduced RV, and diffuse HK worse in inferior base. ? ?Underwent R/L cath in 9/22 for persistent SOB. No CAD. EF 35% Mildly elevated filling pressures with normal output.  ?Ao =  128/75 (95) ?LV =  130/24 ?RA =  10 ?RV =  40/12 ?PA = 45/19 (32) ?PCW = 18  ?Fick cardiac output/index = 5.1/2.3 ?PVR  = 2.6 WU ?FA sat = 94% ?PA sat = 66%, 66% ? ?Subsequently underwent CRT-D upgrade attempt on 10/07/21 by Dr. Curt Bears due to high-degree of RV pacing. 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  ? ?Follow-up 10/20/21.  Noted increased dyspnea. Volume up with > 10 lb weight gain. Lasix increased to 40 mg daily X 1 week then decreased back to 40 mg M, W, F.  ? ?Treated with abx and steroids for COPD exacerbation 12/22 in setting of viral illness +/- pneumonia.  ? ?Follow up 1/23 mildly volume up and Lasix increased to 60 daily, felt he diuresed better on lasix vs torsemide. ? ?S/p Barostim 2/23. ? ?Acute visit 02/25/22, weight up 4 lbs (225-227 lbs), ReDs 43%, & having a COPD flare. Lasix stopped and torsemide 40 mg bid x 3 days, then 40 mg daily started due to decreased urination on furosemide. Instructed to take metolazone 2.5 x 1 dose to augments diuresis. ? ?Saw Pulmonary 02/27/22, respiratory symptoms consistent with his baseline COPD. ? ?Today he returns for HF follow up. Had cramping after taking metolazone, so he drank a lot of fluids/body armour. He has continued to take torsemide 40 bid, did not reduce to once a day after 3 days. Remains SOB walking on flat ground for further distances. Was able to work in the yard this week and  stain his deck without much dyspnea. Has occasional chest tightness. Denies palpitations, dizziness, edema, or PND/Orthopnea. Appetite ok. No fever or chills. Weight at home 223-225 pounds. Taking all medications. Wears O2 PRN, eats bacon and drinks a lot of cranberry juice during the day. Continues to smoke occasionally (2-3 cigs/day).  ? ?Cardiac studies: ?Echo 06/2018 LVEF 30-35%, RV normal  ?Echo 12/2018 LVEF 40-45%, RV normal  ?Echo 11/2020 LVEF 40-45%, RV mildly reduced ?Echo 03/2021 30-35%, RV mildly reduced  ?Echo 07/2021 EF 30-35% RV moderately reduced  ? ?Review of systems complete and found to be negative unless listed in HPI.   ? ?Past Medical History:   ?Diagnosis Date  ? Adenomatous colon polyp   ? Allergy   ? Anal fissure   ? Arthritis   ? Asthma   ? Bronchitis   ? CHF (congestive heart failure) ([place])   ? COPD, group D, by GOLD 2017 classification ([place])   ? Dyspnea   ? Emphysema lung ([place])   ? GERD (gastroesophageal reflux disease)   ? History of hiatal hernia   ? Hyperlipidemia   ? Hypertension   ? NICM (nonischemic cardiomyopathy) ([place])   ? OSA (obstructive sleep apnea)   ? Pneumonia   ? Presence of permanent cardiac pacemaker   ? St Jude  ? Requires supplemental oxygen   ? Sinusitis   ? SOB (shortness of breath)   ? ? ?Current Outpatient Medications  ?Medication Sig Dispense Refill  ? albuterol (PROVENTIL) (2.5 MG/3ML) 0.083% nebulizer solution Take 3 mLs (2.5 mg total) by nebulization every 4 (four) hours as needed for wheezing or shortness of breath. 75 mL 11  ? albuterol (VENTOLIN HFA) 108 (90 Base) MCG/ACT inhaler INHALE 2 PUFFS INTO THE LUNGS EVERY 6 HOURS AS NEEDED FOR WHEEZING 18 g 2  ? allopurinol (ZYLOPRIM) 300 MG tablet Take 1 tablet (300 mg total) by mouth daily. 90 tablet 0  ? ALPRAZolam (XANAX) 0.25 MG tablet Take 1 tablet (0.25 mg total) by mouth 3 (three) times daily. 90 tablet 2  ? AMBULATORY NON FORMULARY MEDICATION Medication Name: Nitroglycerin gel 0.125% apply 2-3 times daily to the anal canal. (Patient taking differently: Place 1 application. rectally in the morning, at noon, and at bedtime. Medication Name: Nitroglycerin gel 0.125% apply 2-3 times daily to the anal canal.) 30 g 1  ? aspirin EC 81 MG tablet Take 81 mg by mouth daily. Swallow whole.    ? Aspirin-Salicylamide-Caffeine (ARTHRITIS STRENGTH BC POWDER PO) Take 0.5 packets by mouth daily.    ? cholecalciferol (VITAMIN D3) 25 MCG (1000 UNIT) tablet Take 1,000 Units by mouth daily.    ? colchicine 0.6 MG tablet Take 0.6 mg by mouth daily as needed.    ? EPINEPHrine 0.3 mg/0.3 mL IJ SOAJ injection Inject 0.3 mg into the muscle as needed for anaphylaxis.    ? fluticasone (FLONASE)  50 MCG/ACT nasal spray Place 1 spray into both nostrils daily.    ? Glucosamine HCl (GLUCOSAMINE PO) Take 1 tablet by mouth daily.    ? guaiFENesin (MUCINEX) 600 MG 12 hr tablet Take 1 tablet (600 mg total) by mouth 2 (two) times daily. 60 tablet 5  ? ipratropium (ATROVENT) 0.06 % nasal spray Place 2 sprays into both nostrils 4 (four) times daily. 15 mL 3  ? Lidocaine, Anorectal, 5 % CREA Apply 1 application topically 2 (two) times daily. 30 g 1  ? losartan (COZAAR) 25 MG tablet Take 0.5 tablets (12.5 mg total) by mouth at bedtime.  15 tablet 3  ? mometasone-formoterol (DULERA) 100-5 MCG/ACT AERO Inhale 2 puffs into the lungs 2 (two) times daily.    ? montelukast (SINGULAIR) 10 MG tablet Take 10 mg by mouth at bedtime.    ? nicotine (NICODERM CQ - DOSED IN MG/24 HOURS) 14 mg/24hr patch Place 1 patch (14 mg total) onto the skin daily. 28 patch 0  ? omeprazole (PRILOSEC) 20 MG capsule Take 1 capsule (20 mg total) by mouth 2 (two) times daily before a meal. ACID REFLUX 180 capsule 1  ? polyethylene glycol (MIRALAX / GLYCOLAX) 17 g packet Take 17 g by mouth daily as needed. 14 each 0  ? potassium chloride SA (KLOR-CON M20) 20 MEQ tablet Take 1 tablet (20 mEq total) by mouth daily. 90 tablet 3  ? predniSONE (DELTASONE) 10 MG tablet TAKE 1 TABLET(10 MG) BY MOUTH DAILY WITH BREAKFAST 30 tablet 5  ? Roflumilast (DALIRESP) 250 MCG TABS Take 1 tablet by mouth daily. 30 tablet 11  ? Tiotropium Bromide Monohydrate (SPIRIVA RESPIMAT) 2.5 MCG/ACT AERS Inhale 2 puffs into the lungs daily. 4 g 4  ? torsemide (DEMADEX) 20 MG tablet Take 2 tablets (40 mg total) by mouth 2 (two) times daily. 180 tablet 3  ? traMADol (ULTRAM) 50 MG tablet Take 1 tablet (50 mg total) by mouth every 8 (eight) hours as needed. 90 tablet 2  ? vitamin B-12 (CYANOCOBALAMIN) 1000 MCG tablet Take 1,000 mcg by mouth daily.    ? ?No current facility-administered medications for this encounter.  ? ?Allergies  ?Allergen Reactions  ? Spironolactone Anaphylaxis  ?  Daliresp [Roflumilast] Other (See Comments)  ?  Dizzy, headache, leg pain per patient. 02/25/22.   ? Jardiance [Empagliflozin] Nausea Only and Other (See Comments)  ?  Lightheadness ?Dizziness  ? Penicil

## 2022-03-09 NOTE — Patient Instructions (Signed)
Thank you for coming in today ? ?No medication changes today ? ?Your physician recommends that you schedule a follow-up appointment in:  ?Keep follow up appointment with Dr. Haroldine Laws ? ?At the [facility], you and your health needs are our priority. As part of our continuing mission to provide you with exceptional heart care, we have created designated Provider Care Teams. These Care Teams include your primary Cardiologist (physician) and Advanced Practice Providers (APPs- Physician Assistants and Nurse Practitioners) who all work together to provide you with the care you need, when you need it.  ? ?You may see any of the following providers on your designated Care Team at your next follow up: ?Dr Glori Bickers ?Dr Loralie Champagne ?Darrick Grinder, NP ?Lyda Jester, PA ?Jessica Milford,NP ?Marlyce Huge, PA ?Audry Riles, PharmD ? ? ?Please be sure to bring in all your medications bottles to every appointment.  ? ?If you have any questions or concerns before your next appointment please send [place] a message through [place] or call our office at [phone].   ? ?TO LEAVE A MESSAGE FOR THE NURSE SELECT OPTION 2, PLEASE LEAVE A MESSAGE INCLUDING: ?YOUR NAME ?DATE OF BIRTH ?CALL BACK NUMBER ?REASON FOR CALL**this is important as we prioritize the call backs ? ?YOU WILL RECEIVE A CALL BACK THE SAME DAY AS LONG AS YOU CALL BEFORE 4:00 PM ? ?

## 2022-03-10 ENCOUNTER — Encounter: Payer: Self-pay | Admitting: Physical Medicine and Rehabilitation

## 2022-03-10 ENCOUNTER — Ambulatory Visit (INDEPENDENT_AMBULATORY_CARE_PROVIDER_SITE_OTHER): Payer: 59 | Admitting: Physical Medicine and Rehabilitation

## 2022-03-10 VITALS — BP 123/79 | HR 88

## 2022-03-10 DIAGNOSIS — M5412 Radiculopathy, cervical region: Secondary | ICD-10-CM

## 2022-03-10 DIAGNOSIS — M5416 Radiculopathy, lumbar region: Secondary | ICD-10-CM

## 2022-03-10 DIAGNOSIS — M4726 Other spondylosis with radiculopathy, lumbar region: Secondary | ICD-10-CM

## 2022-03-10 DIAGNOSIS — M542 Cervicalgia: Secondary | ICD-10-CM | POA: Diagnosis not present

## 2022-03-10 DIAGNOSIS — M4802 Spinal stenosis, cervical region: Secondary | ICD-10-CM

## 2022-03-10 NOTE — Progress Notes (Signed)
Patient presents today for lower back pain and neck pain. States that pain was noticed around 2021 when he had a fall landing and injuring his neck and back. He states that his pain is 3/10 and states that he has been taking BC powders. Pulse does increase and decrease during reading due to patient having pacemaker.  ?

## 2022-03-10 NOTE — Progress Notes (Signed)
? ?Robert Church - 66 y.o. male MRN [id number]  Date of birth: 1956-10-25 ? ?Office Visit Note: ?Visit Date: 03/10/2022 ?PCP: Janith Lima, MD ?Referred by: Janith Lima, MD ? ?Subjective: ?Chief Complaint  ?Patient presents with  ? Lower Back - Follow-up  ? Neck - Follow-up  ? ?HPI: Robert Church is a 66 y.o. male who comes in today at the request of Dr. Joni Fears for evaluation of chronic, worsening and severe bilateral neck pain radiating down both arms/hands and bilateral lower back pain radiating down bilateral legs.  Patient reports pain has been ongoing for several months.  Patient states chronic bilateral neck pain that worsens with movement and activity, describes as constant, tingling/numbness, sore and shooting sensation, currently rates as 9 out of 10. Patient reports he has tried many different conservative therapies such as formal chiropractic treatments, home exercise regimen and use of medications, however the use treatments have not helped to alleviate his pain.  Patient does have a pacemaker and carotid implant, he is not able to have MRI imaging.  Patient cervical spine CT from 2022 exhibits multilevel facet arthropathy, most severe on the left at C2-C3 and spinal canal stenosis at C5-C6.  Patient has no history of cervical epidural steroid injections.  Patient states his chronic bilateral neck pain is his biggest concern and finds that the numbness and tingling sensation to his arms and hands make it difficult to complete job duties and daily tasks. ? ?Chronic, worsening and severe bilateral lower back pain radiating down to both legs.  Patient states pain has been ongoing for several years, reports his pain worsened after a series of multiple falls due to heart condition in 2021.  Patient states pain worsens with walking and activity, describes as a sharp and shooting sensation, currently rates as 7 out of 10.  Patient reports some relief of pain with home exercise regimen, formal  chiropractic treatments and use of BC powder.  Patient's lumbar CT from 2022 exhibits Left foraminal narrowing due to encroachment by bulging disc material that could possibly affect the left L4 nerve. There is also moderate foraminal narrowing on the right at L5-S1 that could affect right L5 nerve. No high grade spinal canal stenosis noted.  Patient was previously treated by Dr. Suella Broad at [facility] and did have lumbar epidural steroid injection, reports this procedure helped to alleviate his pain for 2 to 3 weeks. ? ?Patient's clinical course is complicated by heart failure, cardiac pacemaker, carotid artery calcification, chronic gout, diabetes mellitus, obesity and tobacco use. ? ? ?Review of Systems  ?Musculoskeletal:  Positive for back pain and neck pain.  ?Neurological:  Positive for tingling. Negative for sensory change, focal weakness and weakness.  ?All other systems reviewed and are negative. Otherwise per HPI. ? ?Assessment & Plan: ?Visit Diagnoses:  ?  ICD-10-CM   ?1. Radiculopathy, cervical region  M54.12 Ambulatory referral to Physical Medicine Rehab  ?  ?2. Spinal stenosis of cervical region  M48.02 Ambulatory referral to Physical Medicine Rehab  ?  ?3. Cervicalgia  M54.2   ?  ?4. Lumbar radiculopathy  M54.16 Ambulatory referral to Physical Medicine Rehab  ?  ?5. Other spondylosis with radiculopathy, lumbar region  M47.26   ?  ?   ?Plan: Findings:  ?1.  Chronic, worsening and severe bilateral neck pain radiating down both arms/hands.  Patient continues to have excruciating and debilitating pain despite good conservative therapy such as formal chiropractic treatments, home exercise regimen and use of  medications.  Patient's clinical presentation and exam are consistent with C5/C6 nerve pattern.  Patient does have spinal canal stenosis at C5-C6 on cervical CT.  We believe the next step is to perform a diagnostic and hopefully therapeutic left C7-T1 interlaminar epidural steroid injection under  fluoroscopic guidance.  Patient is not currently undergoing long-term anticoagulation therapy.  We did speak with patient in detail today regarding cervical epidural steroid injection procedure and he has no further questions at this time.  Patient is scheduled for upcoming NCV with EMG in our office on 03/20/2022, we will review the results of this test when procedure is complete. ? ?2.  Chronic, worsening and severe bilateral lower back pain radiating down to both legs.  Patient continues to have severe pain despite good conservative therapy such as formal chiropractic treatments, home exercise regimen and use of medications.  Patient's clinical presentation and exam are complex and is concerning for an L4/L5 radiculopathy, however he does have non dermatomal pain to bilateral legs, he also experiences quite a bit of numbness and tingling in his feet which could be related to a neuropathy.  We believe the neck step is to perform a diagnostic and hopefully therapeutic bilateral L5 transforaminal epidural steroid injection under fluoroscopic guidance.  No red flag symptoms noted upon exam today. ? ?Today, we will place orders for both cervical epidural steroid injection and lumbar epidural steroid injection, patient's bilateral neck pain seems to be more severe and is requesting to have this injection first.  We will work with patient to get him in quickly for injections.  ? ?Meds & Orders: No orders of the defined types were placed in this encounter. ?  ?Orders Placed This Encounter  ?Procedures  ? Ambulatory referral to Physical Medicine Rehab  ?  ?Follow-up: Return for Left C7-T1 interlaminar epidural steroid injection.  ? ?Procedures: ?No procedures performed  ?   ? ?Clinical History: ?No specialty comments available.  ? ?He reports that he quit smoking about 6 weeks ago. His smoking use included cigarettes. He has a 92.00 pack-year smoking history. He has never used smokeless tobacco.  ?Recent Labs  ?   03/25/21 ?1519 10/20/21 ?1406 01/19/22 ?1146 03/09/22 ?1238  ?HGBA1C 6.3 5.6  --   --   ?LABURIC  --  9.2* 9.2* 12.0*  ? ? ?Objective:  VS:  HT:    WT:   BMI:     BP:123/79  HR:88bpm  TEMP: ( )  RESP:94 % ?Physical Exam ?Vitals and nursing note reviewed.  ?HENT:  ?   Head: Normocephalic and atraumatic.  ?   Right Ear: External ear normal.  ?   Left Ear: External ear normal.  ?   Nose: Nose normal.  ?   Mouth/Throat:  ?   Mouth: Mucous membranes are moist.  ?Eyes:  ?   Extraocular Movements: Extraocular movements intact.  ?Cardiovascular:  ?   Rate and Rhythm: Normal rate.  ?   Pulses: Normal pulses.  ?Pulmonary:  ?   Effort: Pulmonary effort is normal.  ?Abdominal:  ?   General: Abdomen is flat. There is no distension.  ?Musculoskeletal:     ?   General: Tenderness present.  ?   Cervical back: Tenderness present.  ?   Comments: Discomfort noted with flexion, extension and side-to-side rotation. Patient has good strength in the upper extremities including 5 out of 5 strength in wrist extension, long finger flexion and APB.  There is no atrophy of the hands intrinsically.  Sensation  intact bilaterally. Negative Hoffman's sign.  ? ?Pt rises from seated position to standing without difficulty. Good lumbar range of motion. Strong distal strength without clonus, no pain upon palpation of greater trochanters. Dysesthesias noted to bilateral L5 dermatomes. Sensation intact bilaterally.   ?Skin: ?   General: Skin is warm and dry.  ?   Capillary Refill: Capillary refill takes less than 2 seconds.  ?Neurological:  ?   General: No focal deficit present.  ?   Mental Status: He is alert and oriented to person, place, and time.  ?Psychiatric:     ?   Mood and Affect: Mood normal.     ?   Behavior: Behavior normal.  ?  ?Ortho Exam ? ?Imaging: ?No results found. ? ?Past Medical/Family/Surgical/Social History: ?Medications & Allergies reviewed per EMR, new medications updated. ?Patient Active Problem List  ? Diagnosis Date  Noted  ? Rectal pain 01/08/2022  ? Preop pulmonary/respiratory exam 12/04/2021  ? Chronic respiratory failure with hypoxia ([place]) 11/24/2021  ? Exposure to the flu 11/20/2021  ? Flu-like symptoms 11/20/2021  ? Encounter for

## 2022-03-11 ENCOUNTER — Encounter: Payer: Self-pay | Admitting: Physical Medicine and Rehabilitation

## 2022-03-16 ENCOUNTER — Other Ambulatory Visit: Payer: Self-pay

## 2022-03-16 ENCOUNTER — Ambulatory Visit (INDEPENDENT_AMBULATORY_CARE_PROVIDER_SITE_OTHER)
Admission: RE | Admit: 2022-03-16 | Discharge: 2022-03-16 | Disposition: A | Discharge status: Home / Self Care | Payer: 59 | Source: Ambulatory Visit | Attending: Acute Care | Admitting: Acute Care

## 2022-03-16 DIAGNOSIS — F17210 Nicotine dependence, cigarettes, uncomplicated: Secondary | ICD-10-CM | POA: Diagnosis not present

## 2022-03-18 ENCOUNTER — Ambulatory Visit: Payer: 59 | Admitting: Student

## 2022-03-18 ENCOUNTER — Telehealth: Payer: Self-pay | Admitting: Acute Care

## 2022-03-18 NOTE — Telephone Encounter (Signed)
I have called the patient with the results of his low dose CT Chest .  I explained that his scan was read as a Lung RADS 2: nodules that are benign in appearance and behavior with a very low likelihood of becoming a clinically active cancer due to size or lack of growth. Recommendation per radiology is for a repeat LDCT in 12 months.  ?There was notation of CAD and PAH.is followed by cardiology.  ?[name] , patient states he has joint aches with his Daliresp.I have asked him to call the office for follow up. ? ?Langley Gauss, please fax results to PCP  and  order annual follow up for 02/2023. Thanks so much ? ?

## 2022-03-18 NOTE — Telephone Encounter (Signed)
Robert Church please advise:  ? ?Patient would like CT results when available.  ?

## 2022-03-19 ENCOUNTER — Other Ambulatory Visit: Payer: Self-pay | Admitting: Acute Care

## 2022-03-19 DIAGNOSIS — Z87891 Personal history of nicotine dependence: Secondary | ICD-10-CM

## 2022-03-19 DIAGNOSIS — F17210 Nicotine dependence, cigarettes, uncomplicated: Secondary | ICD-10-CM

## 2022-03-19 NOTE — Telephone Encounter (Signed)
CT results faxed to PCP. Order placed for 12 mth f/u lung screening CT.  

## 2022-03-20 ENCOUNTER — Ambulatory Visit: Payer: 59 | Admitting: Physical Medicine and Rehabilitation

## 2022-03-20 DIAGNOSIS — R202 Paresthesia of skin: Secondary | ICD-10-CM

## 2022-03-22 NOTE — Progress Notes (Signed)
? ?ELLIAS Church - 66 y.o. male MRN [id number]  Date of birth: [birth year] ? ?Office Visit Note: ?Visit Date: 03/20/2022 ?PCP: Janith Lima, MD ?Referred by: Janith Lima, MD ? ?Subjective: ?Chief Complaint  ?Patient presents with  ? Right Hand - Numbness, Pain  ? Left Hand - Numbness, Pain  ? ?HPI:  Robert Church is a 66 y.o. male who comes in today at the request of Dr. Joni Fears for electrodiagnostic study of the Bilateral upper extremities.  Patient is Right hand dominant.  He reports chronic worsening severe right pain numbness and tingling particularly in the ulnar 2 digits radiating up to the elbow.  Similar symptoms on the left just not as strong.  Some global hand pain and numbness and stiffness at times.  No frank radicular symptoms. ? ?ROS Otherwise per HPI. ? ?Assessment & Plan: ?Visit Diagnoses:  ?  ICD-10-CM   ?1. Paresthesia of skin  R20.2 NCV with EMG (electromyography)  ?  ?  ?Plan: Impression: ?The above electrodiagnostic study is ABNORMAL and reveals evidence of: ? 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  ? 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  ? ?There is no significant electrodiagnostic evidence of any other focal nerve entrapment, brachial plexopathy or cervical radiculopathy.  ? ?Recommendations: ?1.  Follow-up with referring physician. ?2.  Continue current management of symptoms. ?3.  Suggest surgical evaluation. ? ? ?Meds & Orders: No orders of the defined types were placed in this encounter. ?  ?Orders Placed This Encounter  ?Procedures  ? NCV with EMG (electromyography)  ?  ?Follow-up: No follow-ups on file.  ? ?Procedures: ?No procedures performed  ?EMG & NCV Findings: ?Evaluation of the right median motor nerve showed decreased conduction velocity (Elbow-Wrist, 49 m/s).  The left ulnar motor nerve showed prolonged distal onset  latency (4.4 ms) and decreased conduction velocity (B Elbow-Wrist, 44 m/s).  The right ulnar motor nerve showed prolonged distal onset latency (4.8 ms), decreased conduction velocity (B Elbow-Wrist, 50 m/s), and decreased conduction velocity (A Elbow-B Elbow, 28 m/s).  The left median (across palm) sensory and the right median (across palm) sensory nerves showed no response (Palm) and prolonged distal peak latency (L4.3, R3.9 ms).  The left ulnar sensory nerve showed prolonged distal peak latency (4.3 ms) and decreased conduction velocity (Wrist-5th Digit, 33 m/s).  All remaining nerves (as indicated in the following tables) were within normal limits.  Left vs. Right side comparison data for the ulnar motor nerve indicates abnormal L-R velocity difference (A Elbow-B Elbow, 25 m/s).  The ulnar sensory nerve indicates abnormal L-R latency difference (0.6 ms).  All remaining left vs. right side differences were within normal limits.   ? ?All examined muscles (as indicated in the following table) showed no evidence of electrical instability.   ? ?Impression: ?The above electrodiagnostic study is ABNORMAL and reveals evidence of: ? 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  ? 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  ? ?There is no significant electrodiagnostic evidence of any other focal nerve entrapment, brachial plexopathy or cervical radiculopathy.  ? ?Recommendations: ?1.  Follow-up with referring physician. ?2.  Continue current management of symptoms. ?3.  Suggest surgical evaluation. ? ?___________________________ ?Laurence Spates FAAPMR ?Board Certified, Tax adviser of Physical Medicine  and Rehabilitation ? ? ? ?Nerve Conduction Studies ?Anti Sensory Summary Table ? ? Stim Site NR Peak (ms) Norm Peak (ms) P-T Amp (?V) Norm P-T Amp Site1 Site2 Delta-P (ms) Dist (cm) Vel  (m/s) Norm Vel (m/s)  ?Left Median Acr Palm Anti Sensory (2nd Digit)  30.6?C  ?Wrist    *4.3 <3.6 25.5 >10 Wrist Palm  0.0    ?Palm *NR  <2.0          ?Right Median Acr Palm Anti Sensory (2nd Digit)  30.4?C  ?Wrist    *3.9 <3.6 18.4 >10 Wrist Palm  0.0    ?Palm *NR  <2.0          ?Left Radial Anti Sensory (Base 1st Digit)  30.5?C  ?Wrist    2.2 <3.1 26.8  Wrist Base 1st Digit 2.2 0.0    ?Right Radial Anti Sensory (Base 1st Digit)  30.9?C  ?Wrist    2.3 <3.1 20.2  Wrist Base 1st Digit 2.3 0.0    ?Left Ulnar Anti Sensory (5th Digit)  30.9?C  ?Wrist    *4.3 <3.7 18.3 >15.0 Wrist 5th Digit 4.3 14.0 *33 >38  ?Right Ulnar Anti Sensory (5th Digit)  31?C  ?Wrist    3.7 <3.7 19.4 >15.0 Wrist 5th Digit 3.7 14.0 38 >38  ? ?Motor Summary Table ? ? Stim Site NR Onset (ms) Norm Onset (ms) O-P Amp (mV) Norm O-P Amp Site1 Site2 Delta-0 (ms) Dist (cm) Vel (m/s) Norm Vel (m/s)  ?Left Median Motor (Abd Poll Brev)  30.6?C  ?Wrist    4.1 <4.2 8.1 >5 Elbow Wrist 4.3 22.0 51 >50  ?Elbow    8.4  7.3         ?Right Median Motor (Abd Poll Brev)  31.2?C  ?Wrist    4.2 <4.2 7.1 >5 Elbow Wrist 4.6 22.5 *49 >50  ?Elbow    8.8  5.4         ?Left Ulnar Motor (Abd Dig Min)  30.8?C  ?Wrist    *4.4 <4.2 7.5 >3 B Elbow Wrist 5.0 22.0 *44 >53  ?B Elbow    9.4  6.1  A Elbow B Elbow 1.9 10.0 53 >53  ?A Elbow    11.3  5.2         ?Right Ulnar Motor (Abd Dig Min)  31.3?C  ?Wrist    *4.8 <4.2 6.2 >3 B Elbow Wrist 4.3 21.5 *50 >53  ?B Elbow    9.1  5.9  A Elbow B Elbow 3.9 11.0 *28 >53  ?A Elbow    13.0  2.3         ? ?EMG ? ? Side Muscle Nerve Root Ins Act Fibs Psw Amp Dur Poly Recrt Int Fraser Din Comment  ?Right Abd Poll Brev Median C8-T1 Nml Nml Nml Nml Nml 0 Nml Nml   ?Right 1stDorInt Ulnar C8-T1 Nml Nml Nml Nml Nml 0 Nml Nml   ?Right PronatorTeres Median C6-7 Nml Nml Nml Nml Nml 0 Nml Nml   ?Right Biceps Musculocut C5-6 Nml Nml Nml Nml Nml 0 Nml Nml   ?Right Deltoid Axillary C5-6 Nml Nml Nml Nml Nml 0 Nml Nml   ? ? ?Nerve Conduction Studies ?Anti Sensory  Left/Right Comparison ? ? Stim Site L Lat (ms) R Lat (ms) L-R Lat (ms) L Amp (?V) R Amp (?V) L-R Amp (%) Site1 Site2 L Vel (m/s) R Vel (m/s) L-R Vel (m/s)  ?Median Acr Palm Anti Sensory (2nd Digit)  30.6?C  ?Wrist *4.3 *3.9  0.4 25.5 18.4 27.8 Wrist Palm     ?Palm             ?Radial Anti Sensory (Base 1st Digit)  30.5?C  ?Wrist 2.2 2.3 0.1 26.8 20.2 24.6 Wrist Base 1st Digit     ?Ulnar Anti Sensory (5th Digit)  30.9?C  ?Wrist *4.3 3.7 *0.6 18.3 19.4 5.7 Wrist 5th Digit *33 38 5  ? ?Motor Left/Right Comparison ? ? Stim Site L Lat (ms) R Lat (ms) L-R Lat (ms) L Amp (mV) R Amp (mV) L-R Amp (%) Site1 Site2 L Vel (m/s) R Vel (m/s) L-R Vel (m/s)  ?Median Motor (Abd Poll Brev)  30.6?C  ?Wrist 4.1 4.2 0.1 8.1 7.1 12.3 Elbow Wrist 51 *49 2  ?Elbow 8.4 8.8 0.4 7.3 5.4 26.0       ?Ulnar Motor (Abd Dig Min)  30.8?C  ?Wrist *4.4 *4.8 0.4 7.5 6.2 17.3 B Elbow Wrist *44 *50 6  ?B Elbow 9.4 9.1 0.3 6.1 5.9 3.3 A Elbow B Elbow 53 *28 *25  ?A Elbow 11.3 13.0 1.7 5.2 2.3 55.8       ? ? ? ?Waveforms: ?    ? ?    ? ?    ? ?  ? ?  ? ?Clinical History: ?CT of Cervical Spine ?IMPRESSION: ?Central canal stenosis at the C5-6 level that could possibly be ?symptomatic. Bilateral foraminal stenosis additionally at this level ?that could affect either C6 nerve. ?  ?Chronic facet osteoarthritis on the left at C2-3 which could ?contribute to neck pain. No apparent compressive stenosis at this ?level. ?  ?Chronic facet fusion on the right at C3-4. Right foraminal narrowing ?that could affect the right C4 nerve. ?  ?C4-5: Disc bulge and facet osteoarthritis worse on the right. Mild ?right foraminal stenosis. ?  ?C6-7: Bilateral foraminal narrowing of a moderate degree that could ?possibly be symptomatic. ?  ?  ?Electronically Signed ?By: Nelson Chimes M.D. ?On: 06/03/2021 13:46 ?--------------------------------- ?CT of Lumbar Spine ?IMPRESSION: ?No apparent compressive central canal stenosis. ?  ?Mild non-compressive disc bulges L2-3 and L3-4. ?   ?Facet osteoarthritis at L4-5 left worse than right. Left foraminal ?narrowing due to encroachment by bulging disc material could ?possibly affect the left L4 nerve. ?  ?Chronic disc degeneration at L5-S1 wit

## 2022-03-22 NOTE — Procedures (Signed)
EMG & NCV Findings: ?Evaluation of the right median motor nerve showed decreased conduction velocity (Elbow-Wrist, 49 m/s).  The left ulnar motor nerve showed prolonged distal onset latency (4.4 ms) and decreased conduction velocity (B Elbow-Wrist, 44 m/s).  The right ulnar motor nerve showed prolonged distal onset latency (4.8 ms), decreased conduction velocity (B Elbow-Wrist, 50 m/s), and decreased conduction velocity (A Elbow-B Elbow, 28 m/s).  The left median (across palm) sensory and the right median (across palm) sensory nerves showed no response (Palm) and prolonged distal peak latency (L4.3, R3.9 ms).  The left ulnar sensory nerve showed prolonged distal peak latency (4.3 ms) and decreased conduction velocity (Wrist-5th Digit, 33 m/s).  All remaining nerves (as indicated in the following tables) were within normal limits.  Left vs. Right side comparison data for the ulnar motor nerve indicates abnormal L-R velocity difference (A Elbow-B Elbow, 25 m/s).  The ulnar sensory nerve indicates abnormal L-R latency difference (0.6 ms).  All remaining left vs. right side differences were within normal limits.   ? ?All examined muscles (as indicated in the following table) showed no evidence of electrical instability.   ? ?Impression: ?The above electrodiagnostic study is ABNORMAL and reveals evidence of: ? 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  ? 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  ? ?There is no significant electrodiagnostic evidence of any other focal nerve entrapment, brachial plexopathy or cervical radiculopathy.  ? ?Recommendations: ?1.  Follow-up with referring physician. ?2.  Continue current management of symptoms. ?3.  Suggest surgical evaluation. ? ?___________________________ ?Laurence Spates FAAPMR ?Board Certified, Tax adviser of Physical Medicine and  Rehabilitation ? ? ? ?Nerve Conduction Studies ?Anti Sensory Summary Table ? ? Stim Site NR Peak (ms) Norm Peak (ms) P-T Amp (?V) Norm P-T Amp Site1 Site2 Delta-P (ms) Dist (cm) Vel (m/s) Norm Vel (m/s)  ?Left Median Acr Palm Anti Sensory (2nd Digit)  30.6?C  ?Wrist    *4.3 <3.6 25.5 >10 Wrist Palm  0.0    ?Palm *NR  <2.0          ?Right Median Acr Palm Anti Sensory (2nd Digit)  30.4?C  ?Wrist    *3.9 <3.6 18.4 >10 Wrist Palm  0.0    ?Palm *NR  <2.0          ?Left Radial Anti Sensory (Base 1st Digit)  30.5?C  ?Wrist    2.2 <3.1 26.8  Wrist Base 1st Digit 2.2 0.0    ?Right Radial Anti Sensory (Base 1st Digit)  30.9?C  ?Wrist    2.3 <3.1 20.2  Wrist Base 1st Digit 2.3 0.0    ?Left Ulnar Anti Sensory (5th Digit)  30.9?C  ?Wrist    *4.3 <3.7 18.3 >15.0 Wrist 5th Digit 4.3 14.0 *33 >38  ?Right Ulnar Anti Sensory (5th Digit)  31?C  ?Wrist    3.7 <3.7 19.4 >15.0 Wrist 5th Digit 3.7 14.0 38 >38  ? ?Motor Summary Table ? ? Stim Site NR Onset (ms) Norm Onset (ms) O-P Amp (mV) Norm O-P Amp Site1 Site2 Delta-0 (ms) Dist (cm) Vel (m/s) Norm Vel (m/s)  ?Left Median Motor (Abd Poll Brev)  30.6?C  ?Wrist    4.1 <4.2 8.1 >5 Elbow Wrist 4.3 22.0 51 >50  ?Elbow    8.4  7.3         ?Right Median Motor (Abd Poll Brev)  31.2?C  ?Wrist  4.2 <4.2 7.1 >5 Elbow Wrist 4.6 22.5 *49 >50  ?Elbow    8.8  5.4         ?Left Ulnar Motor (Abd Dig Min)  30.8?C  ?Wrist    *4.4 <4.2 7.5 >3 B Elbow Wrist 5.0 22.0 *44 >53  ?B Elbow    9.4  6.1  A Elbow B Elbow 1.9 10.0 53 >53  ?A Elbow    11.3  5.2         ?Right Ulnar Motor (Abd Dig Min)  31.3?C  ?Wrist    *4.8 <4.2 6.2 >3 B Elbow Wrist 4.3 21.5 *50 >53  ?B Elbow    9.1  5.9  A Elbow B Elbow 3.9 11.0 *28 >53  ?A Elbow    13.0  2.3         ? ?EMG ? ? Side Muscle Nerve Root Ins Act Fibs Psw Amp Dur Poly Recrt Int Fraser Din Comment  ?Right Abd Poll Brev Median C8-T1 Nml Nml Nml Nml Nml 0 Nml Nml   ?Right 1stDorInt Ulnar C8-T1 Nml Nml Nml Nml Nml 0 Nml Nml   ?Right PronatorTeres Median C6-7 Nml Nml Nml Nml Nml 0  Nml Nml   ?Right Biceps Musculocut C5-6 Nml Nml Nml Nml Nml 0 Nml Nml   ?Right Deltoid Axillary C5-6 Nml Nml Nml Nml Nml 0 Nml Nml   ? ? ?Nerve Conduction Studies ?Anti Sensory Left/Right Comparison ? ? Stim Site L Lat (ms) R Lat (ms) L-R Lat (ms) L Amp (?V) R Amp (?V) L-R Amp (%) Site1 Site2 L Vel (m/s) R Vel (m/s) L-R Vel (m/s)  ?Median Acr Palm Anti Sensory (2nd Digit)  30.6?C  ?Wrist *4.3 *3.9 0.4 25.5 18.4 27.8 Wrist Palm     ?Palm             ?Radial Anti Sensory (Base 1st Digit)  30.5?C  ?Wrist 2.2 2.3 0.1 26.8 20.2 24.6 Wrist Base 1st Digit     ?Ulnar Anti Sensory (5th Digit)  30.9?C  ?Wrist *4.3 3.7 *0.6 18.3 19.4 5.7 Wrist 5th Digit *33 38 5  ? ?Motor Left/Right Comparison ? ? Stim Site L Lat (ms) R Lat (ms) L-R Lat (ms) L Amp (mV) R Amp (mV) L-R Amp (%) Site1 Site2 L Vel (m/s) R Vel (m/s) L-R Vel (m/s)  ?Median Motor (Abd Poll Brev)  30.6?C  ?Wrist 4.1 4.2 0.1 8.1 7.1 12.3 Elbow Wrist 51 *49 2  ?Elbow 8.4 8.8 0.4 7.3 5.4 26.0       ?Ulnar Motor (Abd Dig Min)  30.8?C  ?Wrist *4.4 *4.8 0.4 7.5 6.2 17.3 B Elbow Wrist *44 *50 6  ?B Elbow 9.4 9.1 0.3 6.1 5.9 3.3 A Elbow B Elbow 53 *28 *25  ?A Elbow 11.3 13.0 1.7 5.2 2.3 55.8       ? ? ? ?Waveforms: ?    ? ?    ? ?    ? ?  ? ? ?

## 2022-03-31 ENCOUNTER — Ambulatory Visit: Payer: 59 | Admitting: Orthopaedic Surgery

## 2022-04-01 ENCOUNTER — Other Ambulatory Visit: Payer: Self-pay | Admitting: Emergency Medicine

## 2022-04-06 NOTE — Progress Notes (Signed)
? ? ?Electrophysiology Office Note ?Date: 04/13/2022 ? ?ID:  TANER RZEPKA, DOB 02-Sep-1956, MRN [id number] ? ?PCP: Janith Lima, MD ?Primary Cardiologist: Quay Burow, MD ?Electrophysiologist: [name], MD  ? ?CC: Routine ICD follow-up ? ?Robert Church is a 66 y.o. male seen today for [name], MD for routine electrophysiology followup. ? ?Pt underwent Barostim implantation 01/29/2022 (commercial) ? ?His goals for device titration are to walk into a building without SOB. Get to the point where he can ride motorcycle again (strength wise). He has sold it.  ? ?At last visit, device changed from 3.0 mA @ 125 ms to 4.0 mA @ 65 ms without change in symptoms that were felt to be 2/2 sinus headache.  ? ?Breathing was OK walking into building today. 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 He can work for about 15-30 minutes before having to stop. He'll take a 10-15 minute break and then work another 20-30 minutes.  ? ? ?Device History: ?St. Jude Dual Chamber PPM implanted 03/2021 for CHB. Failed Upgrade attempt 10/07/2021 ?Barostim implantation 01/29/2022 ? ? ?Past Medical History:  ?Diagnosis Date  ? Adenomatous colon polyp   ? Allergy   ? Anal fissure   ? Arthritis   ? Asthma   ? Bronchitis   ? CHF (congestive heart failure) ([place])   ? COPD, group D, by GOLD 2017 classification ([place])   ? Dyspnea   ? Emphysema lung ([place])   ? GERD (gastroesophageal reflux disease)   ? History of hiatal hernia   ? Hyperlipidemia   ? Hypertension   ? NICM (nonischemic cardiomyopathy) ([place])   ? OSA (obstructive sleep apnea)   ? Pneumonia   ? Presence of permanent cardiac pacemaker   ? St Jude  ? Requires supplemental oxygen   ? Sinusitis   ? SOB (shortness of breath)   ? ?Past Surgical History:  ?Procedure Laterality Date  ? BIV UPGRADE N/A 10/07/2021  ? Procedure: BIV PPM UPGRADE;  Surgeon: Constance Haw, MD;  Location: [facility];  Service: Cardiovascular;   Laterality: N/A;  ? LEFT HEART CATH AND CORONARY ANGIOGRAPHY N/A 04/19/2017  ? Procedure: Left Heart Cath and Coronary Angiography;  Surgeon: Belva Crome, MD;  Location: [facility];  Service: Cardiovascular;  Laterality: N/A;  ? PACEMAKER IMPLANT N/A 04/16/2021  ? Procedure: PACEMAKER IMPLANT;  Surgeon: Constance Haw, MD;  Location: [facility];  Service: Cardiovascular;  Laterality: N/A;  ? RIGHT/LEFT HEART CATH AND CORONARY ANGIOGRAPHY N/A 08/22/2021  ? Procedure: RIGHT/LEFT HEART CATH AND CORONARY ANGIOGRAPHY;  Surgeon: Jolaine Artist, MD;  Location: [facility];  Service: Cardiovascular;  Laterality: N/A;  ? TONSILLECTOMY    ? ? ?Current Outpatient Medications  ?Medication Sig Dispense Refill  ? albuterol (PROVENTIL) (2.5 MG/3ML) 0.083% nebulizer solution Take 3 mLs (2.5 mg total) by nebulization every 4 (four) hours as needed for wheezing or shortness of breath. 75 mL 11  ? albuterol (VENTOLIN HFA) 108 (90 Base) MCG/ACT inhaler INHALE 2 PUFFS INTO THE LUNGS EVERY 6 HOURS AS NEEDED FOR WHEEZING 18 g 2  ? allopurinol (ZYLOPRIM) 300 MG tablet Take 1 tablet (300 mg total) by mouth daily. 90 tablet 0  ? ALPRAZolam (XANAX) 0.25 MG tablet Take 1 tablet (0.25 mg total) by mouth 3 (three) times daily. 90 tablet 2  ? AMBULATORY NON FORMULARY MEDICATION Medication Name: Nitroglycerin gel 0.125% apply 2-3 times daily  to the anal canal. (Patient taking differently: Place 1 application. rectally in the morning, at noon, and at bedtime. Medication Name: Nitroglycerin gel 0.125% apply 2-3 times daily to the anal canal.) 30 g 1  ? aspirin EC 81 MG tablet Take 81 mg by mouth daily. Swallow whole.    ? Aspirin-Salicylamide-Caffeine (ARTHRITIS STRENGTH BC POWDER PO) Take 0.5 packets by mouth daily.    ? cholecalciferol (VITAMIN D3) 25 MCG (1000 UNIT) tablet Take 1,000 Units by mouth daily.    ? colchicine 0.6 MG tablet Take 0.6 mg by mouth daily as needed.    ? EPINEPHrine 0.3 mg/0.3 mL IJ SOAJ  injection Inject 0.3 mg into the muscle as needed for anaphylaxis.    ? fluticasone (FLONASE) 50 MCG/ACT nasal spray SHAKE LIQUID AND USE 2 SPRAYS IN EACH NOSTRIL DAILY 16 g 2  ? Glucosamine HCl (GLUCOSAMINE PO) Take 1 tablet by mouth daily.    ? guaiFENesin (MUCINEX) 600 MG 12 hr tablet Take 1 tablet (600 mg total) by mouth 2 (two) times daily. 60 tablet 5  ? ipratropium (ATROVENT) 0.06 % nasal spray Place 2 sprays into both nostrils 4 (four) times daily. 15 mL 3  ? Lidocaine, Anorectal, 5 % CREA Apply 1 application topically 2 (two) times daily. 30 g 1  ? losartan (COZAAR) 25 MG tablet Take 0.5 tablets (12.5 mg total) by mouth at bedtime. 15 tablet 3  ? mometasone-formoterol (DULERA) 100-5 MCG/ACT AERO Inhale 2 puffs into the lungs 2 (two) times daily.    ? montelukast (SINGULAIR) 10 MG tablet Take 10 mg by mouth at bedtime.    ? nicotine (NICODERM CQ - DOSED IN MG/24 HOURS) 14 mg/24hr patch Place 1 patch (14 mg total) onto the skin daily. 28 patch 0  ? omeprazole (PRILOSEC) 20 MG capsule Take 1 capsule (20 mg total) by mouth 2 (two) times daily before a meal. ACID REFLUX 180 capsule 1  ? polyethylene glycol (MIRALAX / GLYCOLAX) 17 g packet Take 17 g by mouth daily as needed. 14 each 0  ? potassium chloride SA (KLOR-CON M20) 20 MEQ tablet Take 1 tablet (20 mEq total) by mouth daily. 90 tablet 3  ? predniSONE (DELTASONE) 10 MG tablet TAKE 1 TABLET(10 MG) BY MOUTH DAILY WITH BREAKFAST 30 tablet 5  ? Roflumilast (DALIRESP) 250 MCG TABS Take 1 tablet by mouth daily. 30 tablet 11  ? Tiotropium Bromide Monohydrate (SPIRIVA RESPIMAT) 2.5 MCG/ACT AERS Inhale 2 puffs into the lungs daily. 4 g 4  ? torsemide (DEMADEX) 20 MG tablet Take 2 tablets (40 mg total) by mouth 2 (two) times daily. 180 tablet 3  ? traMADol (ULTRAM) 50 MG tablet Take 1 tablet (50 mg total) by mouth every 8 (eight) hours as needed. 90 tablet 2  ? vitamin B-12 (CYANOCOBALAMIN) 1000 MCG tablet Take 1,000 mcg by mouth daily.    ? ?No current  facility-administered medications for this visit.  ? ? ?Allergies:   Spironolactone, Daliresp [roflumilast], Jardiance [empagliflozin], Penicillins, Clindamycin, Doxycycline, E-mycin [erythromycin base], and Rosuvastatin  ? ?Social History: ?Social History  ? ?Socioeconomic History  ? Marital status: Single  ?  Spouse name: Not on file  ? Number of children: 1  ? Years of education: 47  ? Highest education level: Not on file  ?Occupational History  ? Occupation: disabled  ?Tobacco Use  ? Smoking status: Former  ?  Packs/day: 2.00  ?  Years: 46.00  ?  Pack years: 92.00  ?  Types: Cigarettes  ?  Quit date: 01/27/2022  ?  Years since quitting: 0.2  ? Smokeless tobacco: Never  ? Tobacco comments:  ?  Had 3 cigarettes in past 2 weeks ARJ 02/27/22  ?Vaping Use  ? Vaping Use: Never used  ?Substance and Sexual Activity  ? Alcohol use: Not Currently  ?  Alcohol/week: 28.0 standard drinks  ?  Types: 28 Cans of beer per week  ? Drug use: No  ? Sexual activity: Yes  ?  Partners: Female  ?  Birth control/protection: None  ?Other Topics Concern  ? Not on file  ?Social History Narrative  ? Right handed  ? Tea sometimes and coffee (1/2 caffeine)  ? ?Social Determinants of Health  ? ?Financial Resource Strain: Medium Risk  ? Difficulty of Paying Living Expenses: Somewhat hard  ?Food Insecurity: No Food Insecurity  ? Worried About Charity fundraiser in the Last Year: Never true  ? Ran Out of Food in the Last Year: Never true  ?Transportation Needs: No Transportation Needs  ? Lack of Transportation (Medical): No  ? Lack of Transportation (Non-Medical): No  ?Physical Activity: Not on file  ?Stress: Not on file  ?Social Connections: Not on file  ?Intimate Partner Violence: Not on file  ? ? ?Family History: ?Family History  ?Problem Relation Age of Onset  ? Lung cancer Father   ? High blood pressure Mother   ? Diabetes Maternal Grandmother   ? Colon polyps Neg Hx   ? Esophageal cancer Neg Hx   ? Pancreatic cancer Neg Hx   ? Stomach cancer  Neg Hx   ? ? ?Review of Systems: ?Review of systems complete and found to be negative unless listed in HPI.   ? ? ?Physical Exam: ?Vitals:  ? 04/13/22 0947  ?BP: 134/78  ?Pulse: 70  ?SpO2: 99%  ?Weight: 231 lb 9.6 oz (105.1 k

## 2022-04-08 ENCOUNTER — Other Ambulatory Visit: Payer: Self-pay | Admitting: Emergency Medicine

## 2022-04-08 ENCOUNTER — Ambulatory Visit (INDEPENDENT_AMBULATORY_CARE_PROVIDER_SITE_OTHER): Payer: 59

## 2022-04-08 ENCOUNTER — Encounter: Payer: Self-pay | Admitting: Internal Medicine

## 2022-04-08 ENCOUNTER — Ambulatory Visit (INDEPENDENT_AMBULATORY_CARE_PROVIDER_SITE_OTHER): Payer: 59 | Admitting: Internal Medicine

## 2022-04-08 VITALS — BP 126/74 | HR 87 | Temp 97.9°F | Resp 16 | Ht 70.0 in | Wt 229.0 lb

## 2022-04-08 DIAGNOSIS — J449 Chronic obstructive pulmonary disease, unspecified: Secondary | ICD-10-CM | POA: Diagnosis not present

## 2022-04-08 DIAGNOSIS — I442 Atrioventricular block, complete: Secondary | ICD-10-CM

## 2022-04-08 DIAGNOSIS — R0789 Other chest pain: Secondary | ICD-10-CM

## 2022-04-08 DIAGNOSIS — I10 Essential (primary) hypertension: Secondary | ICD-10-CM

## 2022-04-08 DIAGNOSIS — R7303 Prediabetes: Secondary | ICD-10-CM

## 2022-04-08 LAB — CUP PACEART REMOTE DEVICE CHECK
Battery Remaining Longevity: 100 mo
Battery Remaining Percentage: 91 %
Battery Voltage: 3.01 V
Brady Statistic AP VP Percent: 13 %
Brady Statistic AP VS Percent: 1 %
Brady Statistic AS VP Percent: 57 %
Brady Statistic AS VS Percent: 29 %
Brady Statistic RA Percent Paced: 14 %
Brady Statistic RV Percent Paced: 70 %
Date Time Interrogation Session: 20230419020014
Implantable Lead Implant Date: 20220427
Implantable Lead Implant Date: 20220427
Implantable Lead Location: 753859
Implantable Lead Location: 753860
Implantable Pulse Generator Implant Date: 20220427
Lead Channel Impedance Value: 480 Ohm
Lead Channel Impedance Value: 650 Ohm
Lead Channel Pacing Threshold Amplitude: 0.5 V
Lead Channel Pacing Threshold Amplitude: 0.75 V
Lead Channel Pacing Threshold Pulse Width: 0.5 ms
Lead Channel Pacing Threshold Pulse Width: 0.5 ms
Lead Channel Sensing Intrinsic Amplitude: 11.7 mV
Lead Channel Sensing Intrinsic Amplitude: 2.6 mV
Lead Channel Setting Pacing Amplitude: 2 V
Lead Channel Setting Pacing Amplitude: 2.5 V
Lead Channel Setting Pacing Pulse Width: 0.5 ms
Lead Channel Setting Sensing Sensitivity: 2 mV
Pulse Gen Model: 2272
Pulse Gen Serial Number: 3920156

## 2022-04-08 NOTE — Progress Notes (Signed)
? ?Subjective:  ?Patient ID: Robert Church, male    DOB: August 08, 1956  Age: 66 y.o. MRN: [id number] ? ?CC: COPD ? ? ?HPI ?Robert Church presents for f/up - ? ?He complains that he fell 2 weeks ago and injured his left rib cage.  He has discomfort in the area that is getting better.  He continues to complain of cough productive of clear phlegm and shortness of breath. ? ?Outpatient Medications Prior to Visit  ?Medication Sig Dispense Refill  ? albuterol (PROVENTIL) (2.5 MG/3ML) 0.083% nebulizer solution Take 3 mLs (2.5 mg total) by nebulization every 4 (four) hours as needed for wheezing or shortness of breath. 75 mL 11  ? allopurinol (ZYLOPRIM) 300 MG tablet Take 1 tablet (300 mg total) by mouth daily. 90 tablet 0  ? ALPRAZolam (XANAX) 0.25 MG tablet Take 1 tablet (0.25 mg total) by mouth 3 (three) times daily. 90 tablet 2  ? AMBULATORY NON FORMULARY MEDICATION Medication Name: Nitroglycerin gel 0.125% apply 2-3 times daily to the anal canal. (Patient taking differently: Place 1 application. rectally in the morning, at noon, and at bedtime. Medication Name: Nitroglycerin gel 0.125% apply 2-3 times daily to the anal canal.) 30 g 1  ? aspirin EC 81 MG tablet Take 81 mg by mouth daily. Swallow whole.    ? Aspirin-Salicylamide-Caffeine (ARTHRITIS STRENGTH BC POWDER PO) Take 0.5 packets by mouth daily.    ? cholecalciferol (VITAMIN D3) 25 MCG (1000 UNIT) tablet Take 1,000 Units by mouth daily.    ? colchicine 0.6 MG tablet Take 0.6 mg by mouth daily as needed.    ? EPINEPHrine 0.3 mg/0.3 mL IJ SOAJ injection Inject 0.3 mg into the muscle as needed for anaphylaxis.    ? fluticasone (FLONASE) 50 MCG/ACT nasal spray SHAKE LIQUID AND USE 2 SPRAYS IN EACH NOSTRIL DAILY 16 g 2  ? Glucosamine HCl (GLUCOSAMINE PO) Take 1 tablet by mouth daily.    ? guaiFENesin (MUCINEX) 600 MG 12 hr tablet Take 1 tablet (600 mg total) by mouth 2 (two) times daily. 60 tablet 5  ? ipratropium (ATROVENT) 0.06 % nasal spray Place 2 sprays into both  nostrils 4 (four) times daily. 15 mL 3  ? Lidocaine, Anorectal, 5 % CREA Apply 1 application topically 2 (two) times daily. 30 g 1  ? losartan (COZAAR) 25 MG tablet Take 0.5 tablets (12.5 mg total) by mouth at bedtime. (Patient not taking: Reported on 04/09/2022) 15 tablet 3  ? mometasone-formoterol (DULERA) 100-5 MCG/ACT AERO Inhale 2 puffs into the lungs 2 (two) times daily.    ? montelukast (SINGULAIR) 10 MG tablet Take 10 mg by mouth at bedtime.    ? nicotine (NICODERM CQ - DOSED IN MG/24 HOURS) 14 mg/24hr patch Place 1 patch (14 mg total) onto the skin daily. 28 patch 0  ? omeprazole (PRILOSEC) 20 MG capsule Take 1 capsule (20 mg total) by mouth 2 (two) times daily before a meal. ACID REFLUX 180 capsule 1  ? polyethylene glycol (MIRALAX / GLYCOLAX) 17 g packet Take 17 g by mouth daily as needed. 14 each 0  ? potassium chloride SA (KLOR-CON M20) 20 MEQ tablet Take 1 tablet (20 mEq total) by mouth daily. 90 tablet 3  ? predniSONE (DELTASONE) 10 MG tablet TAKE 1 TABLET(10 MG) BY MOUTH DAILY WITH BREAKFAST 30 tablet 5  ? Roflumilast (DALIRESP) 250 MCG TABS Take 1 tablet by mouth daily. 30 tablet 11  ? Tiotropium Bromide Monohydrate (SPIRIVA RESPIMAT) 2.5 MCG/ACT AERS Inhale 2 puffs  into the lungs daily. 4 g 4  ? torsemide (DEMADEX) 20 MG tablet Take 2 tablets (40 mg total) by mouth 2 (two) times daily. 180 tablet 3  ? traMADol (ULTRAM) 50 MG tablet Take 1 tablet (50 mg total) by mouth every 8 (eight) hours as needed. 90 tablet 2  ? vitamin B-12 (CYANOCOBALAMIN) 1000 MCG tablet Take 1,000 mcg by mouth daily.    ? albuterol (VENTOLIN HFA) 108 (90 Base) MCG/ACT inhaler INHALE 2 PUFFS INTO THE LUNGS EVERY 6 HOURS AS NEEDED FOR WHEEZING 18 g 2  ? ?No facility-administered medications prior to visit.  ? ? ?ROS ?Review of Systems  ?Constitutional:  Negative for chills, diaphoresis, fatigue and fever.  ?HENT: Negative.    ?Eyes: Negative.   ?Respiratory:  Positive for cough and shortness of breath. Negative for chest  tightness and wheezing.   ?Cardiovascular:  Positive for chest pain. Negative for palpitations and leg swelling.  ?Gastrointestinal:  Negative for abdominal pain, constipation, diarrhea, nausea and vomiting.  ?Endocrine: Negative.   ?Genitourinary: Negative.   ?Musculoskeletal: Negative.   ?Skin: Negative.  Negative for color change and rash.  ?Neurological:  Negative for dizziness, weakness, light-headedness and headaches.  ?Hematological:  Negative for adenopathy. Does not bruise/bleed easily.  ?Psychiatric/Behavioral:  Negative for decreased concentration, dysphoric mood, hallucinations, sleep disturbance and suicidal ideas. The patient is nervous/anxious. The patient is not hyperactive.   ? ?Objective:  ?BP 126/74 (BP Location: Right Arm, Patient Position: Sitting, Cuff Size: Large)   Pulse 87   Temp 97.9 ?F (36.6 ?C) (Oral)   Resp 16   Ht '5\' 10"'$  (1.778 m)   Wt 229 lb (103.9 kg)   SpO2 92%   BMI 32.86 kg/m?  ? ?BP Readings from Last 3 Encounters:  ?04/09/22 140/88  ?04/08/22 126/74  ?03/10/22 123/79  ? ? ?Wt Readings from Last 3 Encounters:  ?04/09/22 231 lb (104.8 kg)  ?04/08/22 229 lb (103.9 kg)  ?03/09/22 230 lb 3.2 oz (104.4 kg)  ? ? ?Physical Exam ?Vitals reviewed.  ?HENT:  ?   Nose: Nose normal.  ?   Mouth/Throat:  ?   Mouth: Mucous membranes are moist.  ?Eyes:  ?   General: No scleral icterus. ?   Conjunctiva/sclera: Conjunctivae normal.  ?Cardiovascular:  ?   Rate and Rhythm: Normal rate and regular rhythm.  ?   Heart sounds: No murmur heard. ?Pulmonary:  ?   Effort: Pulmonary effort is normal.  ?   Breath sounds: Examination of the right-middle field reveals rhonchi. Examination of the left-middle field reveals rhonchi. Examination of the right-lower field reveals rhonchi. Examination of the left-lower field reveals rhonchi. Rhonchi present. No wheezing or rales.  ?Abdominal:  ?   General: Abdomen is protuberant. There is no distension.  ?   Palpations: Abdomen is soft. There is no fluid wave,  hepatomegaly, splenomegaly or mass.  ?   Tenderness: There is no abdominal tenderness.  ?Musculoskeletal:     ?   General: Normal range of motion.  ?   Cervical back: Neck supple.  ?   Right lower leg: Edema (trace) present.  ?   Left lower leg: Edema (trace) present.  ?Lymphadenopathy:  ?   Cervical: No cervical adenopathy.  ?Skin: ?   General: Skin is warm and dry.  ?Neurological:  ?   General: No focal deficit present.  ?   Mental Status: He is alert. Mental status is at baseline.  ?Psychiatric:     ?   Mood and  Affect: Mood normal.     ?   Behavior: Behavior normal.  ? ? ?Lab Results  ?Component Value Date  ? WBC 6.5 01/29/2022  ? HGB 15.7 01/29/2022  ? HCT 45.3 01/29/2022  ? PLT 194 01/29/2022  ? GLUCOSE 153 (H) 04/09/2022  ? CHOL 191 10/20/2021  ? TRIG 110.0 10/20/2021  ? HDL 52.70 10/20/2021  ? LDLDIRECT 150.0 03/25/2021  ? LDLCALC 117 (H) 10/20/2021  ? ALT 22 01/29/2022  ? AST 21 01/29/2022  ? NA 140 04/09/2022  ? K 4.0 04/09/2022  ? CL 103 04/09/2022  ? CREATININE 1.15 04/09/2022  ? BUN 21 04/09/2022  ? CO2 29 04/09/2022  ? TSH 1.147 04/16/2021  ? PSA 1.7 07/10/2020  ? INR 0.9 01/29/2022  ? HGBA1C 5.6 10/20/2021  ? ? ?CT CHEST LUNG CA SCREEN LOW DOSE W/O CM ? ?Result Date: 03/18/2022 ?CLINICAL DATA:  Current smoker, 49 pack-year history. EXAM: CT CHEST WITHOUT CONTRAST LOW-DOSE FOR LUNG CANCER SCREENING TECHNIQUE: Multidetector CT imaging of the chest was performed following the standard protocol without IV contrast. RADIATION DOSE REDUCTION: This exam was performed according to the departmental dose-optimization program which includes automated exposure control, adjustment of the mA and/or kV according to patient size and/or use of iterative reconstruction technique. COMPARISON:  03/14/2021. FINDINGS: Cardiovascular: Atherosclerotic calcification of the aorta, aortic valve and coronary arteries. Pulmonic trunk and heart are enlarged. No pericardial effusion. Mediastinum/Nodes: No pathologically enlarged  mediastinal or axillary lymph nodes. Hilar regions are difficult to definitively evaluate without IV contrast. Esophagus is grossly unremarkable. Lungs/Pleura: Centrilobular emphysema. Smoking related respiratory bronchiol

## 2022-04-08 NOTE — Patient Instructions (Signed)
Chest Wall Pain Chest wall pain is pain in or around the bones and muscles of your chest. Sometimes, an injury causes this pain. Excessive coughing or overuse of arm and chest muscles may also cause chest wall pain. Sometimes, the cause may not be known. This pain may take several weeks or longer to get better. Follow these instructions at home: Managing pain, stiffness, and swelling  If directed, put ice on the painful area: Put ice in a plastic bag. Place a towel between your skin and the bag. Leave the ice on for 20 minutes, 2-3 times per day. Activity Rest as told by your health care provider. Avoid activities that cause pain. These include any activities that use your chest muscles or your abdominal and side muscles to lift heavy items. Ask your health care provider what activities are safe for you. General instructions  Take over-the-counter and prescription medicines only as told by your health care provider. Do not use any products that contain nicotine or tobacco, such as cigarettes, e-cigarettes, and chewing tobacco. These can delay healing after injury. If you need help quitting, ask your health care provider. Keep all follow-up visits as told by your health care provider. This is important. Contact a health care provider if: You have a fever. Your chest pain becomes worse. You have new symptoms. Get help right away if: You have nausea or vomiting. You feel sweaty or light-headed. You have a cough with mucus from your lungs (sputum) or you cough up blood. You develop shortness of breath. These symptoms may represent a serious problem that is an emergency. Do not wait to see if the symptoms will go away. Get medical help right away. Call your local emergency services (911 in the [place] Do not drive yourself to the hospital. Summary Chest wall pain is pain in or around the bones and muscles of your chest. Depending on the cause, it may be treated with ice, rest, medicines, and  avoiding activities that cause pain. Contact a health care provider if you have a fever, worsening chest pain, or new symptoms. Get help right away if you feel light-headed or you develop shortness of breath. These symptoms may be an emergency. This information is not intended to replace advice given to you by your health care provider. Make sure you discuss any questions you have with your health care provider. Document Revised: 02/21/2021 Document Reviewed: 02/21/2021 Elsevier Patient Education  2023 [employer] Inc.  

## 2022-04-09 ENCOUNTER — Encounter (HOSPITAL_COMMUNITY): Payer: Self-pay | Admitting: Internal Medicine

## 2022-04-09 ENCOUNTER — Ambulatory Visit (HOSPITAL_COMMUNITY)
Admission: RE | Admit: 2022-04-09 | Discharge: 2022-04-09 | Disposition: A | Discharge status: Home / Self Care | Payer: 59 | Source: Ambulatory Visit | Attending: Internal Medicine | Admitting: Internal Medicine

## 2022-04-09 VITALS — BP 140/88 | HR 81 | Wt 231.0 lb

## 2022-04-09 DIAGNOSIS — J449 Chronic obstructive pulmonary disease, unspecified: Secondary | ICD-10-CM | POA: Diagnosis not present

## 2022-04-09 DIAGNOSIS — I5042 Chronic combined systolic (congestive) and diastolic (congestive) heart failure: Secondary | ICD-10-CM | POA: Diagnosis present

## 2022-04-09 DIAGNOSIS — G4733 Obstructive sleep apnea (adult) (pediatric): Secondary | ICD-10-CM | POA: Diagnosis not present

## 2022-04-09 LAB — BASIC METABOLIC PANEL
Anion gap: 8 (ref 5–15)
BUN: 21 mg/dL (ref 8–23)
CO2: 29 mmol/L (ref 22–32)
Calcium: 9.2 mg/dL (ref 8.9–10.3)
Chloride: 103 mmol/L (ref 98–111)
Creatinine, Ser: 1.15 mg/dL (ref 0.61–1.24)
GFR, Estimated: >60 mL/min (ref >60)
Glucose, Bld: 153 mg/dL — ABNORMAL HIGH (ref 70–99)
Potassium: 4 mmol/L (ref 3.5–5.1)
Sodium: 140 mmol/L (ref 135–145)

## 2022-04-09 LAB — BRAIN NATRIURETIC PEPTIDE: B Natriuretic Peptide: 150.8 pg/mL — ABNORMAL HIGH (ref 0.0–100.0)

## 2022-04-09 NOTE — Patient Instructions (Addendum)
There has been no changes to your medications. ? ?Labs done today, your results will be available in MyChart, we will contact you for abnormal readings. ? ?Your physician has requested that you have an echocardiogram. Echocardiography is a painless test that uses sound waves to create images of your heart. It provides your doctor with information about the size and shape of your heart and how well your heart?s chambers and valves are working. This procedure takes approximately one hour. There are no restrictions for this procedure. ? ?Your physician recommends that you schedule a follow-up appointment in: 9 months with an echocardiogram.(November 2023)  **please call the office in September to arrange your follow up appointment ** ? ?If you have any questions or concerns before your next appointment please send [place] a message through [place] or call our office at [phone].   ? ?TO LEAVE A MESSAGE FOR THE NURSE SELECT OPTION 2, PLEASE LEAVE A MESSAGE INCLUDING: ?YOUR NAME ?DATE OF BIRTH ?CALL BACK NUMBER ?REASON FOR CALL**this is important as we prioritize the call backs ? ?YOU WILL RECEIVE A CALL BACK THE SAME DAY AS LONG AS YOU CALL BEFORE 4:00 PM ? ?At the [facility], you and your health needs are our priority. As part of our continuing mission to provide you with exceptional heart care, we have created designated Provider Care Teams. These Care Teams include your primary Cardiologist (physician) and Advanced Practice Providers (APPs- Physician Assistants and Nurse Practitioners) who all work together to provide you with the care you need, when you need it.  ? ?You may see any of the following providers on your designated Care Team at your next follow up: ?Dr Glori Bickers ?Dr Loralie Champagne ?Darrick Grinder, NP ?Lyda Jester, PA ?Jessica Milford,NP ?Marlyce Huge, PA ?Audry Riles, PharmD ? ? ?Please be sure to bring in all your medications bottles to every appointment.  ? ? ?

## 2022-04-09 NOTE — Progress Notes (Signed)
? ?ADVANCED HF CLINIC NOTE ? ?HF MD: Dr. Haroldine Laws  ?Pulmonology: Dr Lamonte Sakai  ? ?Reason for Visit:  F/u for Chronic Systolic Heart Failure  ? ?HPI: ?Robert Church is a 66 y.o. male with h/o obesity, HTN, COPD with ongoing tobacco use and chronic systolic heart failure.  ? ?He underwent cath in 4/18 due to CP. Cath showed normal coronaries with EF 40% and global HK. LVEDP 19.  ? ?He saw Dr. Gwenlyn Found on 07/01/2018 because of recurrent chest pain.  Myoview stress test performed 07/21/2018 showed EF 38% inferior scar with septal ischemia.  A 2D echo performed 07/07/2018 revealed an EF of 30 to 35%.  He was referred for cardiac CT.  ? ?Cardiac CT on 9/19.  Calcium score 148 (74th percentile)  LAD 25% otherwise normal cors. Dilated pulmonary arteries suggestive of PH.  ? ?Worked for [employer]. Retired 5/21. Follows with [name] in Pulmonary Clinic. Was started on bisoprolol 2.5 but couldn't tolerate due to fatigue. Was falling asleep at work. Stopped bisoprolol and felt better.  ? ?Echo 12/20 EF improved to 45%. Mild RV dysfunction  ? ?Admitted 1/22 for acute on chronic hypoxic respiratory failure secondary to acute COPD and CHF w/ volume overload. He had massive diuresis w/ IV Lasix, diuresed down from admit wt of 265 lb to 210 lb. Echo was repeated 12/21, EF 40-45% (unchanged). RV mildly reduced, RVSP 49.  ? ?Followed in clinic, Jardiance and Arlyce Harman added - dose adjusted due to intolerances. He then stopped these on his own.  ? ?In 4/22, had near syncope, Zio showed intermittent CHB. Repeat echo EF 30-35%% Had St Jude dual chamber PM 04/16/21. Had some PMT and device adjusted by EP .  ? ?Echo 07/31/21 with EF 30-35%,  moderately reduced RV, and diffuse HK worse in inferior base. ? ?Underwent R/L cath in 9/22 for persistent SOB. No CAD. EF 35% Mildly elevated filling pressures with normal output.  ?Ao =  128/75 (95) ?LV =  130/24 ?RA =  10 ?RV =  40/12 ?PA = 45/19 (32) ?PCW = 18  ?Fick cardiac output/index = 5.1/2.3 ?PVR  = 2.6 WU ?FA sat = 94% ?PA sat = 66%, 66% ? ?Subsequently underwent CRT-D upgrade attempt on 10/07/21 by Dr. Curt Bears due to high-degree of RV pacing. 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  ? ?S/p Barostim 2/23. ? ?Acute visit 02/25/22, weight up 4 lbs (225-227 lbs), ReDs 43%, & having a COPD flare. Lasix stopped and torsemide 40 mg bid x 3 days, then 40 mg daily started due to decreased urination on furosemide.  ? ?Here for f/u. Says he feels ok. Working in his garden a little. Just has to take frequent breaks. No CP. No edema, orthopnea or PND. Has not taken any more metolazone. Smoking 1-2 cigs/day. Not taking losartan ? ?Cardiac studies: ?Echo 06/2018 LVEF 30-35%, RV normal  ?Echo 12/2018 LVEF 40-45%, RV normal  ?Echo 11/2020 LVEF 40-45%, RV mildly reduced ?Echo 03/2021 30-35%, RV mildly reduced  ?Echo 07/2021 EF 30-35% RV moderately reduced  ? ?Review of systems complete and found to be negative unless listed in HPI.   ? ?Past Medical History:  ?Diagnosis Date  ? Adenomatous colon polyp   ? Allergy   ? Anal fissure   ? Arthritis   ? Asthma   ? Bronchitis   ? CHF (congestive heart failure) ([place])   ? COPD, group D, by GOLD 2017 classification ([place])   ? Dyspnea   ?  Emphysema lung ([place])   ? GERD (gastroesophageal reflux disease)   ? History of hiatal hernia   ? Hyperlipidemia   ? Hypertension   ? NICM (nonischemic cardiomyopathy) ([place])   ? OSA (obstructive sleep apnea)   ? Pneumonia   ? Presence of permanent cardiac pacemaker   ? St Jude  ? Requires supplemental oxygen   ? Sinusitis   ? SOB (shortness of breath)   ? ? ?Current Outpatient Medications  ?Medication Sig Dispense Refill  ? albuterol (PROVENTIL) (2.5 MG/3ML) 0.083% nebulizer solution Take 3 mLs (2.5 mg total) by nebulization every 4 (four) hours as needed for wheezing or shortness of breath. 75 mL 11  ? albuterol (VENTOLIN HFA) 108 (90 Base) MCG/ACT inhaler INHALE 2 PUFFS INTO THE LUNGS EVERY 6 HOURS AS NEEDED FOR WHEEZING 18 g 2   ? allopurinol (ZYLOPRIM) 300 MG tablet Take 1 tablet (300 mg total) by mouth daily. 90 tablet 0  ? ALPRAZolam (XANAX) 0.25 MG tablet Take 1 tablet (0.25 mg total) by mouth 3 (three) times daily. 90 tablet 2  ? AMBULATORY NON FORMULARY MEDICATION Medication Name: Nitroglycerin gel 0.125% apply 2-3 times daily to the anal canal. (Patient taking differently: Place 1 application. rectally in the morning, at noon, and at bedtime. Medication Name: Nitroglycerin gel 0.125% apply 2-3 times daily to the anal canal.) 30 g 1  ? aspirin EC 81 MG tablet Take 81 mg by mouth daily. Swallow whole.    ? Aspirin-Salicylamide-Caffeine (ARTHRITIS STRENGTH BC POWDER PO) Take 0.5 packets by mouth daily.    ? cholecalciferol (VITAMIN D3) 25 MCG (1000 UNIT) tablet Take 1,000 Units by mouth daily.    ? colchicine 0.6 MG tablet Take 0.6 mg by mouth daily as needed.    ? EPINEPHrine 0.3 mg/0.3 mL IJ SOAJ injection Inject 0.3 mg into the muscle as needed for anaphylaxis.    ? fluticasone (FLONASE) 50 MCG/ACT nasal spray SHAKE LIQUID AND USE 2 SPRAYS IN EACH NOSTRIL DAILY 16 g 2  ? Glucosamine HCl (GLUCOSAMINE PO) Take 1 tablet by mouth daily.    ? guaiFENesin (MUCINEX) 600 MG 12 hr tablet Take 1 tablet (600 mg total) by mouth 2 (two) times daily. 60 tablet 5  ? ipratropium (ATROVENT) 0.06 % nasal spray Place 2 sprays into both nostrils 4 (four) times daily. 15 mL 3  ? Lidocaine, Anorectal, 5 % CREA Apply 1 application topically 2 (two) times daily. 30 g 1  ? mometasone-formoterol (DULERA) 100-5 MCG/ACT AERO Inhale 2 puffs into the lungs 2 (two) times daily.    ? montelukast (SINGULAIR) 10 MG tablet Take 10 mg by mouth at bedtime.    ? nicotine (NICODERM CQ - DOSED IN MG/24 HOURS) 14 mg/24hr patch Place 1 patch (14 mg total) onto the skin daily. 28 patch 0  ? omeprazole (PRILOSEC) 20 MG capsule Take 1 capsule (20 mg total) by mouth 2 (two) times daily before a meal. ACID REFLUX 180 capsule 1  ? polyethylene glycol (MIRALAX / GLYCOLAX) 17 g  packet Take 17 g by mouth daily as needed. 14 each 0  ? potassium chloride SA (KLOR-CON M20) 20 MEQ tablet Take 1 tablet (20 mEq total) by mouth daily. 90 tablet 3  ? predniSONE (DELTASONE) 10 MG tablet TAKE 1 TABLET(10 MG) BY MOUTH DAILY WITH BREAKFAST 30 tablet 5  ? Roflumilast (DALIRESP) 250 MCG TABS Take 1 tablet by mouth daily. 30 tablet 11  ? Tiotropium Bromide Monohydrate (SPIRIVA RESPIMAT) 2.5 MCG/ACT AERS Inhale 2 puffs  into the lungs daily. 4 g 4  ? torsemide (DEMADEX) 20 MG tablet Take 2 tablets (40 mg total) by mouth 2 (two) times daily. 180 tablet 3  ? traMADol (ULTRAM) 50 MG tablet Take 1 tablet (50 mg total) by mouth every 8 (eight) hours as needed. 90 tablet 2  ? vitamin B-12 (CYANOCOBALAMIN) 1000 MCG tablet Take 1,000 mcg by mouth daily.    ? losartan (COZAAR) 25 MG tablet Take 0.5 tablets (12.5 mg total) by mouth at bedtime. (Patient not taking: Reported on 04/09/2022) 15 tablet 3  ? ?No current facility-administered medications for this encounter.  ? ?Allergies  ?Allergen Reactions  ? Spironolactone Anaphylaxis  ? Daliresp [Roflumilast] Other (See Comments)  ?  Dizzy, headache, leg pain per patient. 02/25/22.   ? Jardiance [Empagliflozin] Nausea Only and Other (See Comments)  ?  Lightheadness ?Dizziness  ? Penicillins Swelling and Other (See Comments)  ?  Childhood rxn--MD stated he "almost died" ?Has patient had a PCN reaction causing immediate rash, facial/tongue/throat swelling, SOB or lightheadedness with hypotension:Yes ?Has patient had a PCN reaction causing severe rash involving mucus membranes or skin necrosis:unsure ?Has patient had a PCN reaction that required hospitalization:unsure ?Has patient had a PCN reaction occurring within the last 10 years:No ?If all of the above answers are "NO", then may proceed with Cephalosporin use. ? ?  ? Clindamycin Other (See Comments)  ?  Tongue swelling  ? Doxycycline Nausea And Vomiting  ?  Severe stomach upset per patient  ? E-Mycin [Erythromycin  Base] Swelling  ? Rosuvastatin Other (See Comments)  ?  Myalgias (intolerance)  ? ?Social History  ? ?Socioeconomic History  ? Marital status: Single  ?  Spouse name: Not on file  ? Number of children: 1  ?

## 2022-04-13 ENCOUNTER — Ambulatory Visit (INDEPENDENT_AMBULATORY_CARE_PROVIDER_SITE_OTHER): Payer: 59 | Admitting: Student

## 2022-04-13 VITALS — BP 134/78 | HR 70 | Ht 70.0 in | Wt 231.6 lb

## 2022-04-13 DIAGNOSIS — J449 Chronic obstructive pulmonary disease, unspecified: Secondary | ICD-10-CM | POA: Diagnosis not present

## 2022-04-13 DIAGNOSIS — I5042 Chronic combined systolic (congestive) and diastolic (congestive) heart failure: Secondary | ICD-10-CM

## 2022-04-13 DIAGNOSIS — G4733 Obstructive sleep apnea (adult) (pediatric): Secondary | ICD-10-CM | POA: Diagnosis not present

## 2022-04-13 NOTE — Patient Instructions (Signed)
Medication Instructions:  ?Your physician recommends that you continue on your current medications as directed. Please refer to the Current Medication list given to you today. ? ?*If you need a refill on your cardiac medications before your next appointment, please call your pharmacy* ? ? ?Lab Work: ?None ?If you have labs (blood work) drawn today and your tests are completely normal, you will receive your results only by: ?MyChart Message (if you have MyChart) OR ?A paper copy in the mail ?If you have any lab test that is abnormal or we need to change your treatment, we will call you to review the results. ? ? ?Follow-Up: ?At [facility], you and your health needs are our priority.  As part of our continuing mission to provide you with exceptional heart care, we have created designated Provider Care Teams.  These Care Teams include your primary Cardiologist (physician) and Advanced Practice Providers (APPs -  Physician Assistants and Nurse Practitioners) who all work together to provide you with the care you need, when you need it. ? ?Your next appointment:   ?04/29/2022 ?

## 2022-04-20 ENCOUNTER — Telehealth: Payer: Self-pay | Admitting: Physical Medicine and Rehabilitation

## 2022-04-20 NOTE — Telephone Encounter (Signed)
Patient called to cxl appointment 5/2. His call back number is [phone] ?

## 2022-04-21 ENCOUNTER — Encounter: Payer: 59 | Admitting: Physical Medicine and Rehabilitation

## 2022-04-21 NOTE — Progress Notes (Signed)
? ? ?Electrophysiology Office Note ?Date: 04/29/2022 ? ?ID:  Robert Church, DOB [birth year], MRN [id number] ? ?PCP: Janith Lima, MD ?Primary Cardiologist: Quay Burow, MD ?Electrophysiologist: [name], MD  ? ?CC: Routine ICD follow-up ? ?Robert Church is a 66 y.o. male seen today for [name], MD for routine electrophysiology followup. ? ?Pt underwent Barostim implantation 01/29/2022 (commercial) ? ?His goals for device titration are to walk into a building without SOB. Get to the point where he can ride motorcycle again (strength wise). He has sold it.  ? ?At last visit, device titrated from 4.0 ma @ 65 ms to 6.4 ma today.  Pt had stim at 7.2 and 6.8 described as a "lump in his throat".  ? 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 He can work for about 15-30 minutes before having to stop. He'll take a 10-15 minute break and then work another 20-30 minutes. Remains about the same, a little worse in the pollen or heat ? ?Device History: ?St. Jude Dual Chamber PPM implanted 03/2021 for CHB. Failed Upgrade attempt 10/07/2021 ?Barostim implantation 01/29/2022 ? ? ?Past Medical History:  ?Diagnosis Date  ? Adenomatous colon polyp   ? Allergy   ? Anal fissure   ? Arthritis   ? Asthma   ? Bronchitis   ? CHF (congestive heart failure) ([place])   ? COPD, group D, by GOLD 2017 classification ([place])   ? Dyspnea   ? Emphysema lung ([place])   ? GERD (gastroesophageal reflux disease)   ? History of hiatal hernia   ? Hyperlipidemia   ? Hypertension   ? NICM (nonischemic cardiomyopathy) ([place])   ? OSA (obstructive sleep apnea)   ? Pneumonia   ? Presence of permanent cardiac pacemaker   ? St Jude  ? Requires supplemental oxygen   ? Sinusitis   ? SOB (shortness of breath)   ? ?Past Surgical History:  ?Procedure Laterality Date  ? BIV UPGRADE N/A 10/07/2021  ? Procedure: BIV PPM UPGRADE;  Surgeon: Constance Haw, MD;  Location: [facility];  Service: Cardiovascular;  Laterality: N/A;  ? LEFT HEART CATH AND CORONARY ANGIOGRAPHY N/A 04/19/2017  ? Procedure: Left Heart Cath and Coronary Angiography;  Surgeon: Belva Crome, MD;  Location: [facility];  Service: Cardiovascular;  Laterality: N/A;  ? PACEMAKER IMPLANT N/A 04/16/2021  ? Procedure: PACEMAKER IMPLANT;  Surgeon: Constance Haw, MD;  Location: [facility];  Service: Cardiovascular;  Laterality: N/A;  ? RIGHT/LEFT HEART CATH AND CORONARY ANGIOGRAPHY N/A 08/22/2021  ? Procedure: RIGHT/LEFT HEART CATH AND CORONARY ANGIOGRAPHY;  Surgeon: Jolaine Artist, MD;  Location: [facility];  Service: Cardiovascular;  Laterality: N/A;  ? TONSILLECTOMY    ? ? ?Current Outpatient Medications  ?Medication Sig Dispense Refill  ? albuterol (PROVENTIL) (2.5 MG/3ML) 0.083% nebulizer solution Take 3 mLs (2.5 mg total) by nebulization every 4 (four) hours as needed for wheezing or shortness of breath. 75 mL 11  ? albuterol (VENTOLIN HFA) 108 (90 Base) MCG/ACT inhaler INHALE 2 PUFFS INTO THE LUNGS EVERY 6 HOURS AS NEEDED FOR WHEEZING 18 g 2  ? allopurinol (ZYLOPRIM) 300 MG tablet Take 1 tablet (300 mg total) by mouth daily. 90 tablet 0  ? ALPRAZolam (XANAX) 0.5 MG tablet Take 1 tablet (0.5 mg total) by mouth 2 (two) times daily as needed for anxiety. 180 tablet 0  ?  AMBULATORY NON FORMULARY MEDICATION Medication Name: Nitroglycerin gel 0.125% apply 2-3 times daily to the anal canal. (Patient taking differently: Place 1 application. rectally in the morning, at noon, and at bedtime. Medication Name: Nitroglycerin gel 0.125% apply 2-3 times daily to the anal canal.) 30 g 1  ? aspirin EC 81 MG tablet Take 81 mg by mouth daily. Swallow whole.    ? cholecalciferol (VITAMIN D3) 25 MCG (1000 UNIT) tablet Take 1,000 Units by mouth daily.    ? colchicine 0.6 MG tablet Take 0.6 mg by mouth daily as needed.    ? EPINEPHrine 0.3 mg/0.3 mL IJ SOAJ injection Inject 0.3 mg into the muscle as needed for  anaphylaxis.    ? fluticasone (FLONASE) 50 MCG/ACT nasal spray SHAKE LIQUID AND USE 2 SPRAYS IN EACH NOSTRIL DAILY 16 g 2  ? Glucosamine HCl (GLUCOSAMINE PO) Take 1 tablet by mouth daily.    ? guaiFENesin (MUCINEX) 600 MG 12 hr tablet Take 1 tablet (600 mg total) by mouth 2 (two) times daily. 60 tablet 5  ? ipratropium (ATROVENT) 0.06 % nasal spray Place 2 sprays into both nostrils 4 (four) times daily. 15 mL 3  ? Lidocaine, Anorectal, 5 % CREA Apply 1 application topically 2 (two) times daily. 30 g 1  ? losartan (COZAAR) 25 MG tablet Take 0.5 tablets (12.5 mg total) by mouth at bedtime. 15 tablet 3  ? mometasone-formoterol (DULERA) 100-5 MCG/ACT AERO Inhale 2 puffs into the lungs 2 (two) times daily.    ? montelukast (SINGULAIR) 10 MG tablet Take 10 mg by mouth at bedtime.    ? nicotine (NICODERM CQ - DOSED IN MG/24 HOURS) 14 mg/24hr patch Place 1 patch (14 mg total) onto the skin daily. 28 patch 0  ? omeprazole (PRILOSEC) 20 MG capsule Take 1 capsule (20 mg total) by mouth 2 (two) times daily before a meal. ACID REFLUX 180 capsule 1  ? polyethylene glycol (MIRALAX / GLYCOLAX) 17 g packet Take 17 g by mouth daily as needed. 14 each 0  ? potassium chloride SA (KLOR-CON M20) 20 MEQ tablet Take 1 tablet (20 mEq total) by mouth daily. 90 tablet 3  ? predniSONE (DELTASONE) 10 MG tablet TAKE 1 TABLET(10 MG) BY MOUTH DAILY WITH BREAKFAST 30 tablet 5  ? Roflumilast (DALIRESP) 250 MCG TABS Take 1 tablet by mouth daily. 30 tablet 11  ? Tiotropium Bromide Monohydrate (SPIRIVA RESPIMAT) 2.5 MCG/ACT AERS Inhale 2 puffs into the lungs daily. 4 g 4  ? torsemide (DEMADEX) 20 MG tablet Take 2 tablets (40 mg total) by mouth 2 (two) times daily. 180 tablet 3  ? traMADol (ULTRAM) 50 MG tablet Take 1 tablet (50 mg total) by mouth every 8 (eight) hours as needed. 90 tablet 2  ? vitamin B-12 (CYANOCOBALAMIN) 1000 MCG tablet Take 1,000 mcg by mouth daily.    ? ?No current facility-administered medications for this visit.  ? ? ?Allergies:    Spironolactone, Daliresp [roflumilast], Jardiance [empagliflozin], Penicillins, Clindamycin, Doxycycline, E-mycin [erythromycin base], and Rosuvastatin  ? ?Social History: ?Social History  ? ?Socioeconomic History  ? Marital status: Single  ?  Spouse name: Not on file  ? Number of children: 1  ? Years of education: 61  ? Highest education level: Not on file  ?Occupational History  ? Occupation: disabled  ?Tobacco Use  ? Smoking status: Former  ?  Packs/day: 2.00  ?  Years: 46.00  ?  Pack years: 92.00  ?  Types: Cigarettes  ?  Quit date:  01/27/2022  ?  Years since quitting: 0.2  ? Smokeless tobacco: Never  ? Tobacco comments:  ?  Had 3 cigarettes in past 2 weeks ARJ 02/27/22  ?Vaping Use  ? Vaping Use: Never used  ?Substance and Sexual Activity  ? Alcohol use: Not Currently  ?  Alcohol/week: 28.0 standard drinks  ?  Types: 28 Cans of beer per week  ? Drug use: No  ? Sexual activity: Yes  ?  Partners: Female  ?  Birth control/protection: None  ?Other Topics Concern  ? Not on file  ?Social History Narrative  ? Right handed  ? Tea sometimes and coffee (1/2 caffeine)  ? ?Social Determinants of Health  ? ?Financial Resource Strain: Not on file  ?Food Insecurity: Not on file  ?Transportation Needs: Not on file  ?Physical Activity: Not on file  ?Stress: Not on file  ?Social Connections: Not on file  ?Intimate Partner Violence: Not on file  ? ? ?Family History: ?Family History  ?Problem Relation Age of Onset  ? Lung cancer Father   ? High blood pressure Mother   ? Diabetes Maternal Grandmother   ? Colon polyps Neg Hx   ? Esophageal cancer Neg Hx   ? Pancreatic cancer Neg Hx   ? Stomach cancer Neg Hx   ? ? ?Review of Systems: ?Review of systems complete and found to be negative unless listed in HPI.   ? ? ?Physical Exam: ?Vitals:  ? 04/29/22 0949  ?BP: 114/72  ?Pulse: 83  ?SpO2: 92%  ?Weight: 228 lb 3.2 oz (103.5 kg)  ?Height: '5\' 10"'$  (1.778 m)  ? ? ? General: Pleasant, NAD. No resp difficulty ?Psych: Normal affect. ?HEENT:   Normal, without mass or lesion.         ?Neck: Supple, no bruits or JVD. Carotids 2+. No lymphadenopathy/thyromegaly appreciated. ?Heart: PMI nondisplaced. RRR no s3, s4, or murmurs. ?Lungs:  Resp regular a

## 2022-04-22 ENCOUNTER — Encounter: Payer: 59 | Admitting: Physical Medicine and Rehabilitation

## 2022-04-24 NOTE — Progress Notes (Signed)
Remote pacemaker transmission.   

## 2022-04-27 ENCOUNTER — Other Ambulatory Visit: Payer: Self-pay | Admitting: Internal Medicine

## 2022-04-27 ENCOUNTER — Telehealth: Payer: Self-pay | Admitting: Internal Medicine

## 2022-04-27 DIAGNOSIS — K2100 Gastro-esophageal reflux disease with esophagitis, without bleeding: Secondary | ICD-10-CM

## 2022-04-27 DIAGNOSIS — F411 Generalized anxiety disorder: Secondary | ICD-10-CM

## 2022-04-27 MED ORDER — ALPRAZOLAM 0.5 MG PO TABS: 0.5000 mg | ORAL_TABLET | Freq: Two times a day (BID) | ORAL | 0 refills | Status: DC | PRN

## 2022-04-27 MED ORDER — OMEPRAZOLE 20 MG PO CPDR: 20.0000 mg | DELAYED_RELEASE_CAPSULE | Freq: Two times a day (BID) | ORAL | 1 refills | Status: DC

## 2022-04-27 NOTE — Telephone Encounter (Signed)
Patient needs his prilosec refilled but will need a prior authorization because he takes 2 a day. ?He needs his xanax refilled  - takes .25 three times a day - Dr. Ronnald Ramp told him he was going to switch to 1 mg. - once a day ? ?Patient uses [facility] on [address] ?

## 2022-04-29 ENCOUNTER — Telehealth: Payer: Self-pay

## 2022-04-29 ENCOUNTER — Encounter: Payer: Self-pay | Admitting: Student

## 2022-04-29 ENCOUNTER — Ambulatory Visit (INDEPENDENT_AMBULATORY_CARE_PROVIDER_SITE_OTHER): Payer: 59 | Admitting: Student

## 2022-04-29 VITALS — BP 114/72 | HR 83 | Ht 70.0 in | Wt 228.2 lb

## 2022-04-29 DIAGNOSIS — I5042 Chronic combined systolic (congestive) and diastolic (congestive) heart failure: Secondary | ICD-10-CM

## 2022-04-29 DIAGNOSIS — G4733 Obstructive sleep apnea (adult) (pediatric): Secondary | ICD-10-CM

## 2022-04-29 DIAGNOSIS — I442 Atrioventricular block, complete: Secondary | ICD-10-CM

## 2022-04-29 DIAGNOSIS — I10 Essential (primary) hypertension: Secondary | ICD-10-CM

## 2022-04-29 NOTE — Patient Instructions (Signed)

## 2022-04-29 NOTE — Telephone Encounter (Signed)
Pt states his Cardiologist is requesting him have labs done to help dx the fatigue. Cardiologist is wanting him to have Testerone in addition to the labs that are already ordered. ? ?Please advise. ?

## 2022-04-30 ENCOUNTER — Telehealth: Payer: Self-pay | Admitting: Cardiology

## 2022-04-30 NOTE — Telephone Encounter (Signed)
?  1. Has your device fired? No  ? ?2. Is you device beeping? no ? ?3. Are you experiencing draining or swelling at device site? no ? ?4. Are you calling to see if we received your device transmission? no ? ?5. Have you passed out? No ? ?Patient called stating they did an adjust on his BAROSTIM yesterday.  He would like to speak to someone about it.  ? ? ? ?Please route to Device Clinic Pool  ?

## 2022-04-30 NOTE — Telephone Encounter (Signed)
Called to discuss with patient.  ? ?He feels a small lump in his throat when bending deeply over and turning his head. Not bothersome, just aware of it.  He is OK with this. Re-assurance given that it sounds like he just has highly positional stim.    No indication for down-titration as long as patient is comfortable.  ? ?He declines further down-titration at this time.  ? ?Lollie Marrow, PA-C  ?04/30/2022 12:34 PM  ?

## 2022-05-04 ENCOUNTER — Telehealth: Payer: Self-pay

## 2022-05-04 NOTE — Telephone Encounter (Deleted)
This drug has been approved. Approved quantity: 1 units per 90 day(s). The drug has been approved from 04/20/2022 to 05/04/2023. ?

## 2022-05-04 NOTE — Telephone Encounter (Signed)
Key: [id number] ?

## 2022-05-05 ENCOUNTER — Ambulatory Visit (INDEPENDENT_AMBULATORY_CARE_PROVIDER_SITE_OTHER): Payer: 59 | Admitting: Physical Medicine and Rehabilitation

## 2022-05-05 ENCOUNTER — Ambulatory Visit: Payer: Self-pay

## 2022-05-05 DIAGNOSIS — M5416 Radiculopathy, lumbar region: Secondary | ICD-10-CM

## 2022-05-05 MED ORDER — METHYLPREDNISOLONE ACETATE 80 MG/ML IJ SUSP
80.0000 mg | Freq: Once | INTRAMUSCULAR | Status: AC
  Administered 2022-05-05: 80 mg

## 2022-05-05 NOTE — Progress Notes (Signed)
Robert Church - 66 y.o. male MRN [id number]  Date of birth: 14-Sep-1956  Office Visit Note: Visit Date: 05/05/2022 PCP: Janith Lima, MD Referred by: Janith Lima, MD  Subjective: Chief Complaint  Patient presents with   Lower Back - Pain   HPI:  Robert Church is a 66 y.o. male who comes in today at the request of Barnet Pall, FNP for planned Left C7-T1 Cervical Interlaminar epidural steroid injection with fluoroscopic guidance.  The patient has failed conservative care including home exercise, medications, time and activity modification.  This injection will be diagnostic and hopefully therapeutic.  Please see requesting physician notes for further details and justification.   ROS Otherwise per HPI.  Assessment & Plan: Visit Diagnoses:    ICD-10-CM   1. Lumbar radiculopathy  M54.16 XR C-ARM NO REPORT    Epidural Steroid injection    methylPREDNISolone acetate (DEPO-MEDROL) injection 80 mg      Plan: No additional findings.   Meds & Orders:  Meds ordered this encounter  Medications   methylPREDNISolone acetate (DEPO-MEDROL) injection 80 mg    Orders Placed This Encounter  Procedures   XR C-ARM NO REPORT   Epidural Steroid injection    Follow-up: Return for visit to requesting provider as needed.   Procedures: No procedures performed  Cervical Epidural Steroid Injection - Interlaminar Approach with Fluoroscopic Guidance  Patient: Robert Church      Date of Birth: February 04, 1956 MRN: [id number] PCP: Janith Lima, MD      Visit Date: 05/05/2022   Universal Protocol:    Date/Time: 05/31/235:23 AM  Consent Given By: the patient  Position: PRONE  Additional Comments: Vital signs were monitored before and after the procedure. Patient was prepped and draped in the usual sterile fashion. The correct patient, procedure, and site was verified.   Injection Procedure Details:   Procedure diagnoses: Lumbar radiculopathy [M54.16]    Meds Administered:  Meds  ordered this encounter  Medications   methylPREDNISolone acetate (DEPO-MEDROL) injection 80 mg     Laterality: Left  Location/Site: C7-T1  Needle: 3.5 in., 20 ga. Tuohy  Needle Placement: Paramedian epidural space  Findings:  -Comments: Excellent flow of contrast into the epidural space.  Procedure Details: Using a paramedian approach from the side mentioned above, the region overlying the inferior lamina was localized under fluoroscopic visualization and the soft tissues overlying this structure were infiltrated with 4 ml. of 1% Lidocaine without Epinephrine. A # 20 gauge, Tuohy needle was inserted into the epidural space using a paramedian approach.  The epidural space was localized using loss of resistance along with contralateral oblique bi-planar fluoroscopic views.  After negative aspirate for air, blood, and CSF, a 2 ml. volume of Isovue-250 was injected into the epidural space and the flow of contrast was observed. Radiographs were obtained for documentation purposes.   The injectate was administered into the level noted above.  Additional Comments:  The patient tolerated the procedure well Dressing: 2 x 2 sterile gauze and Band-Aid    Post-procedure details: Patient was observed during the procedure. Post-procedure instructions were reviewed.  Patient left the clinic in stable condition.   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 Bilateral foraminal stenosis additionally at this level that could affect either C6 nerve.   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 No apparent compressive stenosis at this level.  Chronic facet fusion on the right at C3-4. Right foraminal narrowing that could affect the right C4 nerve.   C4-5: Disc bulge and facet osteoarthritis worse on the right. Mild right foraminal stenosis.   C6-7: Bilateral  foraminal narrowing of a moderate degree that could possibly be symptomatic.     Electronically Signed By: Nelson Chimes M.D. On: 06/03/2021 13:46 --------------------------------- CT of Lumbar Spine IMPRESSION: No apparent compressive central canal stenosis.   Mild non-compressive disc bulges L2-3 and L3-4.   Facet osteoarthritis at L4-5 left worse than right. Left foraminal narrowing due to encroachment by bulging disc material could possibly affect the left L4 nerve.   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 No apparent compressive stenosis of the canal or lateral recesses. Mild to moderate foraminal narrowing on the right could possibly affect the exiting L5 nerve.     Electronically Signed   By: Nelson Chimes M.D.   On: 06/03/2021 13:50     Objective:  VS:  HT:    WT:   BMI:     BP:   HR: bpm  TEMP: ( )  RESP:  Physical Exam   Imaging: No results found.

## 2022-05-05 NOTE — Patient Instructions (Signed)

## 2022-05-08 ENCOUNTER — Telehealth: Payer: Self-pay | Admitting: Cardiology

## 2022-05-08 ENCOUNTER — Telehealth: Payer: Self-pay
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
## 2022-05-15 ENCOUNTER — Telehealth: Payer: Self-pay | Admitting: Nurse Practitioner

## 2022-05-15 ENCOUNTER — Telehealth: Payer: Self-pay | Admitting: Emergency Medicine

## 2022-05-15 MED ORDER — PREDNISONE 10 MG PO TABS
ORAL_TABLET | ORAL | 0 refills | Status: DC
Start: 2022-05-15 — End: 2022-06-19

## 2022-05-15 MED ORDER — BENZONATATE 200 MG PO CAPS: 200.0000 mg | ORAL_CAPSULE | Freq: Three times a day (TID) | ORAL | 1 refills | Status: DC | PRN

## 2022-05-15 MED ORDER — LEVOFLOXACIN 500 MG PO TABS: 500.0000 mg | ORAL_TABLET | Freq: Every day | ORAL | 0 refills | Status: DC

## 2022-05-15 NOTE — Telephone Encounter (Signed)
Called and spoke with patient. He verbalized understanding of recommendations. Before I could make an appointment for him, he wanted to know about Tessalon perles. I asked if he had any at home and he stated that he did not. He had heard about them from a friend and wondered if he could have some sent in for him.   Joellen Jersey, can you please advise on the Tessalon perles? Thanks!

## 2022-05-15 NOTE — Telephone Encounter (Signed)
Called and spoke with patient. He stated that he has been sick for the past week. He has a productive cough with thick yellow phlegm, increased wheezing and SOB. Denied any fever, body aches or sick exposures. He has been using OTC Mucinex without any relief.   Confirmed that he is still using the Spiriva and Dulera daily. He has Levaquin '500mg'$  at home (only 4 pills). I did attempt to make an appt for him but we do not have any openings today.   Pharmacy is [facility] on [address].   Joellen Jersey, can you please advise since RB is not available today?

## 2022-05-15 NOTE — Telephone Encounter (Signed)
Yes, please send rx for tessalon perles 200 mg 1 capsule Three times a day as needed for cough. Advise him that they are not always covered by insurance so if they are expensive, he should check GoodRx for coupon to take to his pharmacy. Thanks.

## 2022-05-15 NOTE — Telephone Encounter (Signed)
Called and spoke with patient. He verbalized understanding. Medication has been sent to his pharmacy.   Nothing further needed at time of call.

## 2022-05-15 NOTE — Telephone Encounter (Signed)
High risk for decompensation given his history. Please send levaquin 500 mg daily for 5 days. Take with food. Stop and notify immediately if tendon pain or 3-4 watery BMs/day develop. Prednisone taper 40 mg for 3 days, 30 mg for 3 days, 20 mg for 3 days then return to daily 10 mg daily. Monitor oxygen levels at home; goal >88-90%. If he has nebs at home, please advise him to use a minimum of twice a day. Continue the mucinex and his maintenance inhalers. Please schedule him an OV next week for follow up. If he develops worsening symptoms over the weekend, needs to go to the ED. Thanks.

## 2022-05-15 NOTE — Telephone Encounter (Signed)
Called and spoke with patient who states that the pharmacy won't fill his prescription for the prednisone taper because he picked up his normal RX for it 3 days ago. I called the pharmacy and they said the patient can either pay out of pocket for the taper or call his insurance to see if they will cover it because they are not wanting to pay for it. Advised the pharmacy tech that he either needs the RX now or if he uses his normal prescription he's still going to need an early refill because of the taper he is doing. He said those are his only 2 options. I called and told patient the above information and he expressed understanding. Advised him to call and let us know if he needs anything. Nothing further needed at this time.

## 2022-05-17 ENCOUNTER — Telehealth: Payer: Self-pay | Admitting: Internal Medicine

## 2022-05-17 NOTE — Telephone Encounter (Signed)
Patient stated this evening while going to get water, he suddenly got dizzy and passed out for 5-10 seconds he feels like. Currently, doing well with stable VS  I suggested to him that he should come to the ED to get checked out to ensure PPM and his electrolytes are WNL.

## 2022-05-19 ENCOUNTER — Telehealth: Payer: Self-pay

## 2022-05-19 NOTE — Telephone Encounter (Signed)
Transmission received.  Report reviewed.  Device appears to be functioning normally.  Nothing to indicate why Pt may have had syncopal episodes.  Left detailed message advising Pt of normal device report.  Advised to call device clinic if any further questions.

## 2022-05-19 NOTE — Telephone Encounter (Signed)
Patient called in stating that he sent in a transmission on Saturday. Patient states he went into the kitchen to get some ice and next thing he knew he was on the floor. He called the hospital and was told there was nothing they can do and that is why he sent in a reading. Patient doesn't have any symptoms right now as we speak. I let patient know that a nurse will call him back

## 2022-05-19 NOTE — Telephone Encounter (Signed)
Call back received from Pt.  Per Pt he has been sick and is currently on antibiotics.  He states his oxygen level has been dipping into the upper 80's with this URI.  Advised pacemaker working normally.    All questions answered.

## 2022-05-20 ENCOUNTER — Ambulatory Visit: Payer: Medicaid Other | Admitting: Family Medicine

## 2022-05-20 NOTE — Procedures (Signed)
Cervical Epidural Steroid Injection - Interlaminar Approach with Fluoroscopic Guidance  Patient: Robert Church      Date of Birth: Mar 29, 1956 MRN: [id number] PCP: Janith Lima, MD      Visit Date: 05/05/2022   Universal Protocol:    Date/Time: 05/31/235:23 AM  Consent Given By: the patient  Position: PRONE  Additional Comments: Vital signs were monitored before and after the procedure. Patient was prepped and draped in the usual sterile fashion. The correct patient, procedure, and site was verified.   Injection Procedure Details:   Procedure diagnoses: Lumbar radiculopathy [M54.16]    Meds Administered:  Meds ordered this encounter  Medications   methylPREDNISolone acetate (DEPO-MEDROL) injection 80 mg     Laterality: Left  Location/Site: C7-T1  Needle: 3.5 in., 20 ga. Tuohy  Needle Placement: Paramedian epidural space  Findings:  -Comments: Excellent flow of contrast into the epidural space.  Procedure Details: Using a paramedian approach from the side mentioned above, the region overlying the inferior lamina was localized under fluoroscopic visualization and the soft tissues overlying this structure were infiltrated with 4 ml. of 1% Lidocaine without Epinephrine. A # 20 gauge, Tuohy needle was inserted into the epidural space using a paramedian approach.  The epidural space was localized using loss of resistance along with contralateral oblique bi-planar fluoroscopic views.  After negative aspirate for air, blood, and CSF, a 2 ml. volume of Isovue-250 was injected into the epidural space and the flow of contrast was observed. Radiographs were obtained for documentation purposes.   The injectate was administered into the level noted above.  Additional Comments:  The patient tolerated the procedure well Dressing: 2 x 2 sterile gauze and Band-Aid    Post-procedure details: Patient was observed during the procedure. Post-procedure instructions were  reviewed.  Patient left the clinic in stable condition.

## 2022-05-21 ENCOUNTER — Other Ambulatory Visit: Payer: Self-pay

## 2022-05-21 ENCOUNTER — Ambulatory Visit (INDEPENDENT_AMBULATORY_CARE_PROVIDER_SITE_OTHER): Payer: 59

## 2022-05-21 ENCOUNTER — Encounter (HOSPITAL_BASED_OUTPATIENT_CLINIC_OR_DEPARTMENT_OTHER): Payer: Self-pay | Admitting: *Deleted

## 2022-05-21 ENCOUNTER — Encounter: Payer: 59 | Admitting: Physical Medicine and Rehabilitation

## 2022-05-21 ENCOUNTER — Emergency Department (HOSPITAL_BASED_OUTPATIENT_CLINIC_OR_DEPARTMENT_OTHER)
Admission: EM | Admit: 2022-05-21 | Discharge: 2022-05-21 | Discharge status: Against Medical Advice | Payer: 59 | Attending: Emergency Medicine | Admitting: Emergency Medicine

## 2022-05-21 ENCOUNTER — Emergency Department (HOSPITAL_BASED_OUTPATIENT_CLINIC_OR_DEPARTMENT_OTHER): Payer: 59 | Admitting: Radiology

## 2022-05-21 ENCOUNTER — Ambulatory Visit: Payer: Medicaid Other | Admitting: Family Medicine

## 2022-05-21 ENCOUNTER — Ambulatory Visit (INDEPENDENT_AMBULATORY_CARE_PROVIDER_SITE_OTHER): Payer: 59 | Admitting: Nurse Practitioner

## 2022-05-21 ENCOUNTER — Encounter: Payer: Self-pay | Admitting: Nurse Practitioner

## 2022-05-21 VITALS — BP 126/68 | HR 77 | Temp 98.5°F | Ht 70.0 in | Wt 224.8 lb

## 2022-05-21 DIAGNOSIS — J189 Pneumonia, unspecified organism: Secondary | ICD-10-CM | POA: Diagnosis not present

## 2022-05-21 DIAGNOSIS — I509 Heart failure, unspecified: Secondary | ICD-10-CM | POA: Insufficient documentation

## 2022-05-21 DIAGNOSIS — J181 Lobar pneumonia, unspecified organism: Secondary | ICD-10-CM | POA: Diagnosis not present

## 2022-05-21 DIAGNOSIS — Z950 Presence of cardiac pacemaker: Secondary | ICD-10-CM | POA: Diagnosis not present

## 2022-05-21 DIAGNOSIS — I5042 Chronic combined systolic (congestive) and diastolic (congestive) heart failure: Secondary | ICD-10-CM

## 2022-05-21 DIAGNOSIS — J449 Chronic obstructive pulmonary disease, unspecified: Secondary | ICD-10-CM | POA: Diagnosis not present

## 2022-05-21 DIAGNOSIS — R0902 Hypoxemia: Secondary | ICD-10-CM | POA: Diagnosis not present

## 2022-05-21 DIAGNOSIS — J45909 Unspecified asthma, uncomplicated: Secondary | ICD-10-CM | POA: Insufficient documentation

## 2022-05-21 DIAGNOSIS — R7989 Other specified abnormal findings of blood chemistry: Secondary | ICD-10-CM | POA: Insufficient documentation

## 2022-05-21 DIAGNOSIS — I110 Hypertensive heart disease with heart failure: Secondary | ICD-10-CM | POA: Insufficient documentation

## 2022-05-21 DIAGNOSIS — Z20822 Contact with and (suspected) exposure to covid-19: Secondary | ICD-10-CM | POA: Insufficient documentation

## 2022-05-21 DIAGNOSIS — Z7951 Long term (current) use of inhaled steroids: Secondary | ICD-10-CM | POA: Insufficient documentation

## 2022-05-21 DIAGNOSIS — R778 Other specified abnormalities of plasma proteins: Secondary | ICD-10-CM | POA: Diagnosis not present

## 2022-05-21 DIAGNOSIS — Z7982 Long term (current) use of aspirin: Secondary | ICD-10-CM | POA: Insufficient documentation

## 2022-05-21 DIAGNOSIS — R0602 Shortness of breath: Secondary | ICD-10-CM | POA: Diagnosis present

## 2022-05-21 LAB — CBC WITH DIFFERENTIAL/PLATELET
Abs Immature Granulocytes: 0.07 10*3/uL (ref 0.00–0.07)
Basophils Absolute: 0 10*3/uL (ref 0.0–0.1)
Basophils Relative: 0 %
Eosinophils Absolute: 0 10*3/uL (ref 0.0–0.5)
Eosinophils Relative: 0 %
HCT: 46.4 % (ref 39.0–52.0)
Hemoglobin: 15.6 g/dL (ref 13.0–17.0)
Immature Granulocytes: 1 %
Lymphocytes Relative: 8 %
Lymphs Abs: 0.8 10*3/uL (ref 0.7–4.0)
MCH: 32.7 pg (ref 26.0–34.0)
MCHC: 33.6 g/dL (ref 30.0–36.0)
MCV: 97.3 fL (ref 80.0–100.0)
Monocytes Absolute: 0.4 10*3/uL (ref 0.1–1.0)
Monocytes Relative: 4 %
Neutro Abs: 8.3 10*3/uL — ABNORMAL HIGH (ref 1.7–7.7)
Neutrophils Relative %: 87 %
Platelets: 208 10*3/uL (ref 150–400)
RBC: 4.77 MIL/uL (ref 4.22–5.81)
RDW: 12.2 % (ref 11.5–15.5)
WBC: 9.6 10*3/uL (ref 4.0–10.5)
nRBC: 0 % (ref 0.0–0.2)

## 2022-05-21 LAB — COMPREHENSIVE METABOLIC PANEL
ALT: 24 U/L (ref 0–44)
AST: 20 U/L (ref 15–41)
Albumin: 4.4 g/dL (ref 3.5–5.0)
Alkaline Phosphatase: 46 U/L (ref 38–126)
Anion gap: 14 (ref 5–15)
BUN: 22 mg/dL (ref 8–23)
CO2: 27 mmol/L (ref 22–32)
Calcium: 9.4 mg/dL (ref 8.9–10.3)
Chloride: 101 mmol/L (ref 98–111)
Creatinine, Ser: 0.97 mg/dL (ref 0.61–1.24)
GFR, Estimated: >60 mL/min (ref >60)
Glucose, Bld: 152 mg/dL — ABNORMAL HIGH (ref 70–99)
Potassium: 3.8 mmol/L (ref 3.5–5.1)
Sodium: 142 mmol/L (ref 135–145)
Total Bilirubin: 0.5 mg/dL (ref 0.3–1.2)
Total Protein: 6.9 g/dL (ref 6.5–8.1)

## 2022-05-21 LAB — TROPONIN I (HIGH SENSITIVITY)
Troponin I (High Sensitivity): 17 ng/L (ref <18)
Troponin I (High Sensitivity): 18 ng/L — ABNORMAL HIGH (ref <18)

## 2022-05-21 LAB — SARS CORONAVIRUS 2 BY RT PCR: SARS Coronavirus 2 by RT PCR: NEGATIVE

## 2022-05-21 LAB — LACTIC ACID, PLASMA
Lactic Acid, Venous: 2.5 mmol/L (ref 0.5–1.9)
Lactic Acid, Venous: 1.3 mmol/L (ref 0.5–1.9)

## 2022-05-21 LAB — BRAIN NATRIURETIC PEPTIDE: B Natriuretic Peptide: 221.9 pg/mL — ABNORMAL HIGH (ref 0.0–100.0)

## 2022-05-21 MED ORDER — LEVOFLOXACIN IN D5W 750 MG/150ML IV SOLN
750.0000 mg | Freq: Once | INTRAVENOUS | Status: AC
  Administered 2022-05-21: 750 mg via INTRAVENOUS
  Filled 2022-05-21: qty 150

## 2022-05-21 MED ORDER — LEVOFLOXACIN 750 MG PO TABS: 750.0000 mg | ORAL_TABLET | Freq: Every day | ORAL | 0 refills | Status: AC

## 2022-05-21 MED ORDER — METHYLPREDNISOLONE SODIUM SUCC 125 MG IJ SOLR
125.0000 mg | Freq: Once | INTRAMUSCULAR | Status: AC
  Administered 2022-05-21: 125 mg via INTRAVENOUS
  Filled 2022-05-21: qty 2

## 2022-05-21 MED ORDER — IPRATROPIUM-ALBUTEROL 0.5-2.5 (3) MG/3ML IN SOLN
3.0000 mL | RESPIRATORY_TRACT | Status: AC
  Administered 2022-05-21: 3 mL via RESPIRATORY_TRACT
  Filled 2022-05-21 (×3): qty 3

## 2022-05-21 NOTE — ED Notes (Signed)
Accidentally validated pt's O2 at 82% @ 2105. Re-validated at 90% at 2110.

## 2022-05-21 NOTE — Patient Instructions (Signed)
Please seek further evaluation at the emergency department   Contact us once your discharged for follow up.

## 2022-05-21 NOTE — ED Notes (Signed)
Awaiting patient arrival to ED room 11

## 2022-05-21 NOTE — ED Notes (Signed)
ED Provider at bedside. 

## 2022-05-21 NOTE — Progress Notes (Signed)
$'@Patient'R$  ID: Robert Church, male    DOB: 13-Apr-1956, 66 y.o.   MRN: [id number]  Chief Complaint  Patient presents with   Acute Visit    Pt is here due to not feeling well. Last week he got a cold and Friday was called in [place] but has not improved. Chest congestion, nasal drainage,, right ear clogged up, no fever noted, cough with clear mucus. Pt is on prednisone daily. Pt is on [place] daily. Albuterol as needed. Flonase and Atrovent nasal spray. Pt states its not helping numch.     Referring provider: Janith Lima, MD  HPI: 66 year old male, former smoker followed for very severe COPD, chronic bronchitic phenotype, chronic rhinitis, and chronic respiratory failure on Trilogy vent and 4 lpm supplemental O2 at night. He is a patient of Dr. Agustina Caroli and last seen in office on 02/27/2022. 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   TEST/EVENTS:  03/16/2022 CT chest lung cancer screening: Atherosclerosis.  Pulmonic trunk and heart are enlarged.  No LAD.  There is centrilobular emphysema and smoking-related respiratory bronchiolitis.  There are calcified granulomas.  Pulmonary nodule measures 7.6 mm, stable.  Lung RADS 2  02/27/2022: OV with [name]. Still has some DOE at baseline but able to do some yard work. Chronic cough, usually only at night. He did have some dizziness and leg pain with 500 mcg daliresp so he stopped and symptoms improved. He was able to restart 250 mcg. Continued spiriva and dulera for triple therapy regimen. Continued daily prednisone 10 mg. Discussed adding daily azithromycin but he is hesitant d/t not tolerating erythromycin in the past. Continued on trilogy vent with supplemental oxygen at night. Does not have daytime desaturations   05/21/2022: Today-acute Patient presents today for persistent AECOPD.  He had contacted the office on 5/26 with productive cough with thick yellow  phlegm, increased wheezing and shortness of breath.  He was unable to get in for office visit and because of his risk for decompensation he was treated with Levaquin 500 mg for 5 days and started on prednisone taper with instructions to return to his daily dosing afterwards.  Today, he reports that he has not felt any better.  Continues to have a persistent cough.  Cough has cleared some but he has worsening chest congestion.  Also feels like he is still very short of breath if not worse.  He has an occasional wheeze.  He has had to use his nebulizer every 4 hours.  Previously was checking his oxygen and maintaining in the 90s; however, on arrival to exam room today he was noted to be 87% on room air.  He does have oxygen available at home but only wears it at night with his trilogy vent.  He denies any fevers, hemoptysis, anorexia, night sweats, lower extremity swelling.  He is on 20 mg of prednisone for 1 more day and then anticipated to go back to his daily 10 mg dosing.  He completed his Levaquin course as previously prescribed.  He continues on [place], and Daliresp.  Allergies  Allergen Reactions   Spironolactone Anaphylaxis   Daliresp [Roflumilast] Other (See Comments)    Dizzy, headache, leg pain per patient. 02/25/22.    Jardiance [Empagliflozin] Nausea Only and Other (See Comments)    Lightheadness Dizziness   Penicillins Swelling and Other (See Comments)    Childhood rxn--MD stated he "almost died" Has patient  had a PCN reaction causing immediate rash, facial/tongue/throat swelling, SOB or lightheadedness with hypotension:Yes Has patient had a PCN reaction causing severe rash involving mucus membranes or skin necrosis:unsure Has patient had a PCN reaction that required hospitalization:unsure Has patient had a PCN reaction occurring within the last 10 years:No If all of the above answers are "NO", then may proceed with Cephalosporin use.     Clindamycin Other (See Comments)     Tongue swelling   Doxycycline Nausea And Vomiting    Severe stomach upset per patient   E-Mycin [Erythromycin Base] Swelling   Rosuvastatin Other (See Comments)    Myalgias (intolerance)    Immunization History  Administered Date(s) Administered   Influenza,inj,Quad PF,6+ Mos 09/17/2021   PNEUMOCOCCAL CONJUGATE-20 08/20/2021   Tdap 12/03/2011    Past Medical History:  Diagnosis Date   Adenomatous colon polyp    Allergy    Anal fissure    Arthritis    Asthma    Bronchitis    CHF (congestive heart failure) (HCC)    COPD, group D, by GOLD 2017 classification ([place])    Dyspnea    Emphysema lung (HCC)    GERD (gastroesophageal reflux disease)    History of hiatal hernia    Hyperlipidemia    Hypertension    NICM (nonischemic cardiomyopathy) (HCC)    OSA (obstructive sleep apnea)    Pneumonia    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   Requires supplemental oxygen    Sinusitis    SOB (shortness of breath)     Tobacco History: Social History   Tobacco Use  Smoking Status Former   Packs/day: 2.00   Years: 46.00   Pack years: 92.00   Types: Cigarettes   Quit date: 01/27/2022   Years since quitting: 0.3  Smokeless Tobacco Never  Tobacco Comments   Had 3 cigarettes in past 2 weeks ARJ 02/27/22   Counseling given: Not Answered Tobacco comments: Had 3 cigarettes in past 2 weeks ARJ 02/27/22   Outpatient Medications Prior to Visit  Medication Sig Dispense Refill   albuterol (PROVENTIL) (2.5 MG/3ML) 0.083% nebulizer solution Take 3 mLs (2.5 mg total) by nebulization every 4 (four) hours as needed for wheezing or shortness of breath. 75 mL 11   albuterol (VENTOLIN HFA) 108 (90 Base) MCG/ACT inhaler INHALE 2 PUFFS INTO THE LUNGS EVERY 6 HOURS AS NEEDED FOR WHEEZING 18 g 2   allopurinol (ZYLOPRIM) 300 MG tablet Take 1 tablet (300 mg total) by mouth daily. 90 tablet 0   ALPRAZolam (XANAX) 0.5 MG tablet Take 1 tablet (0.5 mg total) by mouth 2 (two) times daily as  needed for anxiety. 180 tablet 0   AMBULATORY NON FORMULARY MEDICATION Medication Name: Nitroglycerin gel 0.125% apply 2-3 times daily to the anal canal. (Patient taking differently: Place 1 application. rectally in the morning, at noon, and at bedtime. Medication Name: Nitroglycerin gel 0.125% apply 2-3 times daily to the anal canal.) 30 g 1   aspirin EC 81 MG tablet Take 81 mg by mouth daily. Swallow whole.     benzonatate (TESSALON) 200 MG capsule Take 1 capsule (200 mg total) by mouth 3 (three) times daily as needed for cough. 30 capsule 1   cholecalciferol (VITAMIN D3) 25 MCG (1000 UNIT) tablet Take 1,000 Units by mouth daily.     colchicine 0.6 MG tablet Take 0.6 mg by mouth daily as needed.     EPINEPHrine 0.3 mg/0.3 mL IJ SOAJ injection Inject 0.3 mg  into the muscle as needed for anaphylaxis.     fluticasone (FLONASE) 50 MCG/ACT nasal spray SHAKE LIQUID AND USE 2 SPRAYS IN EACH NOSTRIL DAILY 16 g 2   Glucosamine HCl (GLUCOSAMINE PO) Take 1 tablet by mouth daily.     guaiFENesin (MUCINEX) 600 MG 12 hr tablet Take 1 tablet (600 mg total) by mouth 2 (two) times daily. 60 tablet 5   ipratropium (ATROVENT) 0.06 % nasal spray Place 2 sprays into both nostrils 4 (four) times daily. 15 mL 3   Lidocaine, Anorectal, 5 % CREA Apply 1 application topically 2 (two) times daily. 30 g 1   losartan (COZAAR) 25 MG tablet Take 0.5 tablets (12.5 mg total) by mouth at bedtime. 15 tablet 3   mometasone-formoterol (DULERA) 100-5 MCG/ACT AERO Inhale 2 puffs into the lungs 2 (two) times daily.     montelukast (SINGULAIR) 10 MG tablet Take 10 mg by mouth at bedtime.     nicotine (NICODERM CQ - DOSED IN MG/24 HOURS) 14 mg/24hr patch Place 1 patch (14 mg total) onto the skin daily. 28 patch 0   omeprazole (PRILOSEC) 20 MG capsule Take 1 capsule (20 mg total) by mouth 2 (two) times daily before a meal. ACID REFLUX 180 capsule 1   polyethylene glycol (MIRALAX / GLYCOLAX) 17 g packet Take 17 g by mouth daily as needed.  14 each 0   potassium chloride SA (KLOR-CON M20) 20 MEQ tablet Take 1 tablet (20 mEq total) by mouth daily. 90 tablet 3   predniSONE (DELTASONE) 10 MG tablet TAKE 1 TABLET(10 MG) BY MOUTH DAILY WITH BREAKFAST 30 tablet 5   predniSONE (DELTASONE) 10 MG tablet Take 4 tabs by mouth for 3 days, then 3 for 3 days, 2 for 3 days, 1 for 3 days and stop 30 tablet 0   Roflumilast (DALIRESP) 250 MCG TABS Take 1 tablet by mouth daily. 30 tablet 11   Tiotropium Bromide Monohydrate (SPIRIVA RESPIMAT) 2.5 MCG/ACT AERS Inhale 2 puffs into the lungs daily. 4 g 4   torsemide (DEMADEX) 20 MG tablet Take 2 tablets (40 mg total) by mouth 2 (two) times daily. 180 tablet 3   traMADol (ULTRAM) 50 MG tablet Take 1 tablet (50 mg total) by mouth every 8 (eight) hours as needed. 90 tablet 2   vitamin B-12 (CYANOCOBALAMIN) 1000 MCG tablet Take 1,000 mcg by mouth daily.     levofloxacin (LEVAQUIN) 500 MG tablet Take 1 tablet (500 mg total) by mouth daily. 5 tablet 0   No facility-administered medications prior to visit.     Review of Systems:   Constitutional: No weight loss or gain, night sweats, fevers, chills +fatigue, lassitude. HEENT: No headaches, difficulty swallowing, tooth/dental problems, or sore throat. No sneezing, itching, ear ache, nasal congestion, or post nasal drip CV:  No chest pain, orthopnea, PND, swelling in lower extremities, anasarca, dizziness, palpitations, syncope Resp: +shortness of breath with minimal exertion; productive cough; chest congestion; occasional wheeze.  No hemoptysis. No chest wall deformity GI:  No heartburn, indigestion, abdominal pain, nausea, vomiting, diarrhea, change in bowel habits, loss of appetite, bloody stools.  Skin: No rash, lesions, ulcerations MSK:  No joint pain or swelling.  No decreased range of motion.  No back pain. Neuro: No dizziness or lightheadedness.  Psych: No depression or anxiety. Mood stable.     Physical Exam:  BP 126/68 (BP Location: Left Arm,  Patient Position: Sitting, Cuff Size: Normal)   Pulse 77   Temp 98.5 F (36.9 C) (Oral)  Ht '5\' 10"'$  (1.778 m)   Wt 224 lb 12.8 oz (102 kg)   SpO2 93%   BMI 32.26 kg/m   GEN: Pleasant, interactive, chronically-ill appearing; obese; in no acute distress. HEENT:  Normocephalic and atraumatic. PERRLA. Sclera white. Nasal turbinates pink, moist and patent bilaterally. No rhinorrhea present. Oropharynx pink and moist, without exudate or edema. No lesions, ulcerations, or postnasal drip.  NECK:  Supple w/ fair ROM. No JVD present. Normal carotid impulses w/o bruits. Thyroid symmetrical with no goiter or nodules palpated. No lymphadenopathy.   CV: RRR, no m/r/g, no peripheral edema. Pulses intact, +2 bilaterally. No cyanosis, pallor or clubbing. PULMONARY:  Unlabored, regular breathing. Scattered rhonchi and wheezes bilaterally A&P. No accessory muscle use. No dullness to percussion. GI: BS present and normoactive. Soft, non-tender to palpation. No organomegaly or masses detected. No CVA tenderness. MSK: No erythema, warmth or tenderness. Cap refil <2 sec all extrem. No deformities or joint swelling noted.  Neuro: A/Ox3. No focal deficits noted.   Skin: Warm, no lesions or rashe Psych: Normal affect and behavior. Judgement and thought content appropriate.     Lab Results:  CBC    Component Value Date/Time   WBC 6.5 01/29/2022 0648   RBC 4.73 01/29/2022 0648   HGB 15.7 01/29/2022 0648   HGB 15.4 09/26/2021 1318   HCT 45.3 01/29/2022 0648   HCT 44.4 09/26/2021 1318   PLT 194 01/29/2022 0648   PLT 215 09/26/2021 1318   MCV 95.8 01/29/2022 0648   MCV 95 09/26/2021 1318   MCH 33.2 01/29/2022 0648   MCHC 34.7 01/29/2022 0648   RDW 13.2 01/29/2022 0648   RDW 12.6 09/26/2021 1318   LYMPHSABS 0.7 09/26/2021 1318   MONOABS 0.5 07/23/2021 0317   EOSABS 0.0 09/26/2021 1318   BASOSABS 0.0 09/26/2021 1318    BMET    Component Value Date/Time   NA 140 04/09/2022 1159   NA 144 02/16/2022  1220   K 4.0 04/09/2022 1159   CL 103 04/09/2022 1159   CO2 29 04/09/2022 1159   GLUCOSE 153 (H) 04/09/2022 1159   BUN 21 04/09/2022 1159   BUN 15 02/16/2022 1220   CREATININE 1.15 04/09/2022 1159   CREATININE 1.04 07/10/2020 1035   CALCIUM 9.2 04/09/2022 1159   GFRNONAA >60 04/09/2022 1159   GFRAA >60 02/14/2020 1454    BNP    Component Value Date/Time   BNP 150.8 (H) 04/09/2022 1159     Imaging:  Epidural Steroid injection  Result Date: 05/05/2022 Magnus Sinning, MD     05/20/2022  5:23 AM Cervical Epidural Steroid Injection - Interlaminar Approach with Fluoroscopic Guidance Patient: CEZAR MISIASZEK     Date of Birth: 1956-03-16 MRN: [id number] PCP: Janith Lima, MD     Visit Date: 05/05/2022  Universal Protocol:   Date/Time: 05/31/235:23 AM Consent Given By: the patient Position: PRONE Additional Comments: Vital signs were monitored before and after the procedure. Patient was prepped and draped in the usual sterile fashion. The correct patient, procedure, and site was verified. Injection Procedure Details: Procedure diagnoses: Lumbar radiculopathy [M54.16]  Meds Administered: Meds ordered this encounter Medications  methylPREDNISolone acetate (DEPO-MEDROL) injection 80 mg  Laterality: Left Location/Site: C7-T1 Needle: 3.5 in., 20 ga. Tuohy Needle Placement: Paramedian epidural space Findings:  -Comments: Excellent flow of contrast into the epidural space. Procedure Details: Using a paramedian approach from the side mentioned above, the region overlying the inferior lamina was localized under fluoroscopic visualization and the soft tissues overlying this  structure were infiltrated with 4 ml. of 1% Lidocaine without Epinephrine. A # 20 gauge, Tuohy needle was inserted into the epidural space using a paramedian approach. The epidural space was localized using loss of resistance along with contralateral oblique bi-planar fluoroscopic views.  After negative aspirate for air, blood, and CSF, a 2  ml. volume of Isovue-250 was injected into the epidural space and the flow of contrast was observed. Radiographs were obtained for documentation purposes. The injectate was administered into the level noted above. Additional Comments: The patient tolerated the procedure well Dressing: 2 x 2 sterile gauze and Band-Aid  Post-procedure details: Patient was observed during the procedure. Post-procedure instructions were reviewed. Patient left the clinic in stable condition.  DG Chest 2 View  Result Date: 05/21/2022 CLINICAL DATA:  Congestion, cough EXAM: CHEST - 2 VIEW COMPARISON:  Chest radiograph 04/08/2022 FINDINGS: A left chest wall cardiac device with 2 leads and right chest wall neurostimulator device with a lead coursing into the right neck are again seen. The cardiomediastinal silhouette is unchanged, with unchanged mild cardiomegaly. The lungs are hyperinflated, with flattening of the diaphragms suggesting underlying COPD. There is new patchy opacity in the lateral left lung base with silhouetting of the diaphragm. There is no other focal airspace disease. There is no pleural effusion or pneumothorax There is no acute osseous abnormality. IMPRESSION: 1. New patchy opacities in the lateral left lower lung with silhouetting of the diaphragms suspicious for pneumonia in the correct clinical setting. 2. Unchanged hyperinflated lungs suggesting underlying COPD and mild cardiomegaly. Electronically Signed   By: Valetta Mole M.D.   On: 05/21/2022 16:47   XR C-ARM NO REPORT  Result Date: 05/05/2022 Please see Notes tab for imaging impression.   methylPREDNISolone acetate (DEPO-MEDROL) injection 80 mg     Date Action Dose Route User   05/05/2022 1415 Given 80 mg Other Magnus Sinning, MD           View : No data to display.          No results found for: NITRICOXIDE      Assessment & Plan:   CAP (community acquired pneumonia) Given clinical worsening, CXR obtained which showed a new  patchy opacity in the left lung base concerning for pneumonia.  He has already been treated with Levaquin and prednisone taper with no improvement in his symptoms and now with new oxygen requirement. At this point, he has failed outpatient treatment. Advised he seek further treatment inpatient - EMS offered and patient declined; will go via private vehicle.   COPD with acute exacerbation ([place]) Unresolved AECOPD despite prednisone taper and levaquin course with new oxygen requirement. See above plan.  Chronic combined systolic and diastolic CHF (congestive heart failure) ([place]) Appears compensated on exam. Symptoms consistent with unresolving AECOPD r/t CAP.    I spent 41 minutes of dedicated to the care of this patient on the date of this encounter to include pre-visit review of records, face-to-face time with the patient discussing conditions above, post visit ordering of testing, clinical documentation with the electronic health record, making appropriate referrals as documented, and communicating necessary findings to members of the patients care team.  Clayton Bibles, NP 05/21/2022  Pt aware and understands NP's role.

## 2022-05-21 NOTE — ED Triage Notes (Signed)
Pt had a URI and was seen by his MD and placed on levaquin and prednisone Friday. Pt was told that he had pneumonia.  Pt continues to have sob which has improved a little but he was told to come here for further eval.

## 2022-05-21 NOTE — ED Notes (Signed)
While provider at bedside pt O2 desat to 87% on RA -- per provider pt placed on suppl O2 via [place] -- now on 2L O2 via [place]; O2 sats 94% (note-- pt uses O2 concentrator QHS at home - has not required daytime suppl home O2 in 7 months)

## 2022-05-21 NOTE — ED Provider Notes (Signed)
[facility] Provider Note   CSN: [id number] Arrival date & time: 05/21/22  1912     History {Add pertinent medical, surgical, social history, OB history to HPI:1} Chief Complaint  Patient presents with   Pneumonia    [facility] is a 66 y.o. male.  HPI     8 days ago mowed the lawn, Last Thursday began to have a little bit of a cough Friday began to have worsening, cough, increased prednisone and started levaquin Increased shortness of breath, coughing a lot Was coughing up yellow but clear now No fevers No nausea, vomiting Chest tightness, feels like COPD, has been wheezing more, breathing treatments helped at home    Home Medications Prior to Admission medications   Medication Sig Start Date End Date Taking? Authorizing Provider  albuterol (PROVENTIL) (2.5 MG/3ML) 0.083% nebulizer solution Take 3 mLs (2.5 mg total) by nebulization every 4 (four) hours as needed for wheezing or shortness of breath. 08/20/21   Collene Gobble, MD  albuterol (VENTOLIN HFA) 108 (90 Base) MCG/ACT inhaler INHALE 2 PUFFS INTO THE LUNGS EVERY 6 HOURS AS NEEDED FOR WHEEZING 04/09/22   Byrum, Rose Fillers, MD  allopurinol (ZYLOPRIM) 300 MG tablet Take 1 tablet (300 mg total) by mouth daily. 02/02/22   Janith Lima, MD  ALPRAZolam Duanne Moron) 0.5 MG tablet Take 1 tablet (0.5 mg total) by mouth 2 (two) times daily as needed for anxiety. 04/27/22   Janith Lima, MD  AMBULATORY NON FORMULARY MEDICATION Medication Name: Nitroglycerin gel 0.125% apply 2-3 times daily to the anal canal. Patient taking differently: Place 1 application. rectally in the morning, at noon, and at bedtime. Medication Name: Nitroglycerin gel 0.125% apply 2-3 times daily to the anal canal. 01/08/22   Zehr, Laban Emperor, PA-C  aspirin EC 81 MG tablet Take 81 mg by mouth daily. Swallow whole.    [provider]  benzonatate (TESSALON) 200 MG capsule Take 1 capsule (200 mg total) by mouth 3 (three) times  daily as needed for cough. 05/15/22   Cobb, Karie Schwalbe, NP  cholecalciferol (VITAMIN D3) 25 MCG (1000 UNIT) tablet Take 1,000 Units by mouth daily.    [provider]  colchicine 0.6 MG tablet Take 0.6 mg by mouth daily as needed.    [provider]  EPINEPHrine 0.3 mg/0.3 mL IJ SOAJ injection Inject 0.3 mg into the muscle as needed for anaphylaxis. 06/27/21   [provider]  fluticasone (FLONASE) 50 MCG/ACT nasal spray SHAKE LIQUID AND USE 2 SPRAYS IN EACH NOSTRIL DAILY 04/01/22   Byrum, Rose Fillers, MD  Glucosamine HCl (GLUCOSAMINE PO) Take 1 tablet by mouth daily.    [provider]  guaiFENesin (MUCINEX) 600 MG 12 hr tablet Take 1 tablet (600 mg total) by mouth 2 (two) times daily. 12/17/21   Collene Gobble, MD  ipratropium (ATROVENT) 0.06 % nasal spray Place 2 sprays into both nostrils 4 (four) times daily. 12/17/21   Collene Gobble, MD  Lidocaine, Anorectal, 5 % CREA Apply 1 application topically 2 (two) times daily. 01/08/22   Zehr, Laban Emperor, PA-C  losartan (COZAAR) 25 MG tablet Take 0.5 tablets (12.5 mg total) by mouth at bedtime. 04/15/21   Clegg, Amy D, NP  mometasone-formoterol (DULERA) 100-5 MCG/ACT AERO Inhale 2 puffs into the lungs 2 (two) times daily.    [provider]  montelukast (SINGULAIR) 10 MG tablet Take 10 mg by mouth at bedtime.    [provider]  nicotine (  NICODERM CQ - DOSED IN MG/24 HOURS) 14 mg/24hr patch Place 1 patch (14 mg total) onto the skin daily. 12/17/21   Collene Gobble, MD  omeprazole (PRILOSEC) 20 MG capsule Take 1 capsule (20 mg total) by mouth 2 (two) times daily before a meal. ACID REFLUX 04/27/22   Janith Lima, MD  polyethylene glycol (MIRALAX / GLYCOLAX) 17 g packet Take 17 g by mouth daily as needed. 12/30/20   Oretha Milch D, MD  potassium chloride SA (KLOR-CON M20) 20 MEQ tablet Take 1 tablet (20 mEq total) by mouth daily. 10/20/21 02/26/23  Bensimhon, Shaune Pascal, MD  predniSONE (DELTASONE) 10 MG tablet  TAKE 1 TABLET(10 MG) BY MOUTH DAILY WITH BREAKFAST 10/30/21   Byrum, Rose Fillers, MD  predniSONE (DELTASONE) 10 MG tablet Take 4 tabs by mouth for 3 days, then 3 for 3 days, 2 for 3 days, 1 for 3 days and stop 05/15/22   Cobb, Karie Schwalbe, NP  Roflumilast (DALIRESP) 250 MCG TABS Take 1 tablet by mouth daily. 03/02/22   Collene Gobble, MD  Tiotropium Bromide Monohydrate (SPIRIVA RESPIMAT) 2.5 MCG/ACT AERS Inhale 2 puffs into the lungs daily. 02/10/22   Parrett, Fonnie Mu, NP  torsemide (DEMADEX) 20 MG tablet Take 2 tablets (40 mg total) by mouth 2 (two) times daily. 02/25/22   [place], Maricela Bo, FNP  traMADol (ULTRAM) 50 MG tablet Take 1 tablet (50 mg total) by mouth every 8 (eight) hours as needed. 02/17/22   Janith Lima, MD  vitamin B-12 (CYANOCOBALAMIN) 1000 MCG tablet Take 1,000 mcg by mouth daily.    [provider]      Allergies    Spironolactone, Daliresp [roflumilast], Jardiance [empagliflozin], Penicillins, Clindamycin, Doxycycline, E-mycin [erythromycin base], and Rosuvastatin    Review of Systems   Review of Systems  Physical Exam Updated Vital Signs BP (!) 142/65 (BP Location: Left Arm)   Pulse 98   Temp 98.3 F (36.8 C) (Oral)   Resp 16   Wt 102 kg   SpO2 92%   BMI 32.26 kg/m  Physical Exam  ED Results / Procedures / Treatments   Labs (all labs ordered are listed, but only abnormal results are displayed) Labs Reviewed  CULTURE, BLOOD (ROUTINE X 2)  CULTURE, BLOOD (ROUTINE X 2)  SARS CORONAVIRUS 2 BY RT PCR  CBC WITH DIFFERENTIAL/PLATELET  COMPREHENSIVE METABOLIC PANEL  LACTIC ACID, PLASMA  LACTIC ACID, PLASMA  BRAIN NATRIURETIC PEPTIDE  TROPONIN I (HIGH SENSITIVITY)    EKG None  Radiology DG Chest 2 View  Result Date: 05/21/2022 CLINICAL DATA:  Congestion, cough EXAM: CHEST - 2 VIEW COMPARISON:  Chest radiograph 04/08/2022 FINDINGS: A left chest wall cardiac device with 2 leads and right chest wall neurostimulator device with a lead coursing into the  right neck are again seen. The cardiomediastinal silhouette is unchanged, with unchanged mild cardiomegaly. The lungs are hyperinflated, with flattening of the diaphragms suggesting underlying COPD. There is new patchy opacity in the lateral left lung base with silhouetting of the diaphragm. There is no other focal airspace disease. There is no pleural effusion or pneumothorax There is no acute osseous abnormality. IMPRESSION: 1. New patchy opacities in the lateral left lower lung with silhouetting of the diaphragms suspicious for pneumonia in the correct clinical setting. 2. Unchanged hyperinflated lungs suggesting underlying COPD and mild cardiomegaly. Electronically Signed   By: Valetta Mole M.D.   On: 05/21/2022 16:47    Procedures Procedures  {Document cardiac monitor, telemetry assessment procedure when  appropriate:1}  Medications Ordered in ED Medications - No data to display  ED Course/ Medical Decision Making/ A&P                           Medical Decision Making Amount and/or Complexity of Data Reviewed Labs: ordered. Radiology: ordered.   ***  {Document critical care time when appropriate:1} {Document review of labs and clinical decision tools ie heart score, Chads2Vasc2 etc:1}  {Document your independent review of radiology images, and any outside records:1} {Document your discussion with family members, caretakers, and with consultants:1} {Document social determinants of health affecting pt's care:1} {Document your decision making why or why not admission, treatments were needed:1} Final Clinical Impression(s) / ED Diagnoses Final diagnoses:  None    Rx / DC Orders ED Discharge Orders     None

## 2022-05-21 NOTE — ED Notes (Signed)
Provider has removed nasal cannula to monitor O2 sats on RA -- pt O2 desat 86-87% RA

## 2022-05-21 NOTE — Assessment & Plan Note (Addendum)
Unresolved AECOPD despite prednisone taper and levaquin course with new oxygen requirement. See above plan.

## 2022-05-21 NOTE — Assessment & Plan Note (Signed)
Appears compensated on exam. Symptoms consistent with unresolving AECOPD r/t CAP.

## 2022-05-21 NOTE — ED Notes (Signed)
RT assessed and administered duo neb. Pt ordered three rounds but declined the second and third stating he did not need them. RT will continue to monitor.

## 2022-05-21 NOTE — Assessment & Plan Note (Signed)
Given clinical worsening, CXR obtained which showed a new patchy opacity in the left lung base concerning for pneumonia.  He has already been treated with Levaquin and prednisone taper with no improvement in his symptoms and now with new oxygen requirement. At this point, he has failed outpatient treatment. Advised he seek further treatment inpatient - EMS offered and patient declined; will go via private vehicle.

## 2022-05-21 NOTE — ED Notes (Signed)
ED Provider at bedside.  Pt being encouraged to remain in hospital for admission due to increase O2 demands and to continue IV ABX therapy however pt is refusing; insistent that he "get home to my cats and dogs" -- this nurse suggested pt consider boarding pets however pt states he cant afford boarding services --- states he has full O2 tanks at home

## 2022-05-21 NOTE — ED Notes (Signed)
Pt now AMA -- this nurse has verbally reinforced recommendations per ED attending to include re-starting PO ABX as prescribed by ED provider this visit - pt acknowledges verbal understanding and denies any additional questions, concerns, needs- escorted to vehicle via w/c on 2L O2 via [place]

## 2022-05-22 ENCOUNTER — Telehealth: Payer: Self-pay | Admitting: Emergency Medicine

## 2022-05-22 ENCOUNTER — Telehealth: Payer: Self-pay

## 2022-05-22 MED ORDER — NICOTINE 14 MG/24HR TD PT24
14.0000 mg | MEDICATED_PATCH | Freq: Every day | TRANSDERMAL | 0 refills | Status: DC
Start: 2022-05-22 — End: 2024-02-09

## 2022-05-22 NOTE — Telephone Encounter (Signed)
Confirmed Nicoderm patches were listed on pt's med list then placed refill request with added note that pt would prefer Nicoderm brand clear patches which are clear. Updated pt and placed order. Nothing further needed at this time.

## 2022-05-22 NOTE — Telephone Encounter (Signed)
Returned patient's call. Patient states he went to ED for worsening shortness of breath yesterday after recently being diagnoses with pneumonia. Left AMA. PO antibiotics continue. Advised patient that remote transmission could not directly identify worsening heart failure. Known COPD and O2 dependent. Patient also states he had a syncopal episode several nights ago while standing in the kitchen. Of note patient had self discontinued losartan several months ago and recently resumed taking just prior to "blacking out". Patient c/o intermittant "sharp pain" down his neck and concerned may be related to barostim. ED precautions give. Patient scheduled with A. Tillery, PA 05/25/22 at 12:20 to address all concerns above. Of note patient's covid test negative 05/21/22.

## 2022-05-22 NOTE — Telephone Encounter (Signed)
Patient is currently using nicorette patches, but wants to switch to nicoderm patches because they stay on better.  Uses [facility] on [address].  Please advise.

## 2022-05-22 NOTE — Telephone Encounter (Signed)
Patient called in stating he was seen in the hospital 6/1 and they told him his heart failure has gotten worse. Patient was seen for pneumonia, he would also like a call back today before closing

## 2022-05-22 NOTE — Addendum Note (Signed)
Addended by: Gavin Potters R on: 05/22/2022 05:07 PM   Modules accepted: Orders

## 2022-05-25 ENCOUNTER — Telehealth: Payer: Self-pay | Admitting: Cardiology

## 2022-05-25 ENCOUNTER — Encounter: Payer: 59 | Admitting: Student

## 2022-05-25 NOTE — Telephone Encounter (Signed)
Successful telephone encounter with patient who states he cannot make it to his appointment today with A. Tillery, PA secondary to having vertigo. Patient originally states he is able to drive but will not be able to walk from the parking deck to the office secondary to getting extremely dizzy when he stands up. He is currently taking meclizine. Discussed with patient it may be in part related to his BP however patient is convinced it is vertigo and not postural hypotension because his resting SBP>115. Patient is instructed to call back for appointment ASAP once he is able to drive safely. Patient verbalizes understanding.

## 2022-05-25 NOTE — Telephone Encounter (Signed)
Noted.   Pt has appt with Pulmonology tomorrow he should keep if possible given recent PNA diagnosis

## 2022-05-25 NOTE — Telephone Encounter (Signed)
Pt has a appt today at 12:20 pm. Pt would like to know if someone would be able to help him with a wheelchair once getting ou the car. Pt would like a callback regarding this matter before leaving for the appt if possible. Please advise

## 2022-05-25 NOTE — Progress Notes (Deleted)
Electrophysiology Office Note Date: 05/25/2022  ID:  ALMUS WOODHAM, DOB 1956-09-22, MRN [id number]  PCP: Janith Lima, MD Primary Cardiologist: Quay Burow, MD Electrophysiologist: Constance Haw, MD   CC: Routine ICD follow-up  [facility] is a 66 y.o. male seen today for Robert Meredith Leeds, MD for routine electrophysiology followup.  Pt underwent Barostim implantation 01/29/2022 (commercial)  His goals for device titration are to walk into a building without SOB. Get to the point where he can ride motorcycle again (strength wise). He has sold it.   At last visit, any attempts to titrate device above 6.4 ma resulted in stim, so this is where he has been left.   Pt called office 5/30 with complains of syncopal episodes.  Noted he had been on antibiotics with a URI and reported O2 in the 80s at times.  He had previously called IM office for same and was recommended to go to ER, but pt declined.   Pt did presented to ER 05/21/2022 and was treated for CAP. Noted to have desat down into 80s on room air.  He was recommended for admission and left AMA because he could not arrange care for his pets.   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 He can work for about 15-30 minutes before having to stop. He'll take a 10-15 minute break and then work another 20-30 minutes. Remains about the same, a little worse in the pollen or heat  Device History: St. Jude Dual Chamber PPM implanted 03/2021 for CHB. Failed Upgrade attempt 10/07/2021 Barostim implantation 01/29/2022   Past Medical History:  Diagnosis Date   Adenomatous colon polyp    Allergy    Anal fissure    Arthritis    Asthma    Bronchitis    CHF (congestive heart failure) ([place])    COPD, group D, by GOLD 2017 classification ([place])    Dyspnea    Emphysema lung (HCC)    GERD (gastroesophageal reflux disease)    History of hiatal hernia     Hyperlipidemia    Hypertension    NICM (nonischemic cardiomyopathy) (HCC)    OSA (obstructive sleep apnea)    Pneumonia    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   Requires supplemental oxygen    Sinusitis    SOB (shortness of breath)    Past Surgical History:  Procedure Laterality Date   BIV UPGRADE N/A 10/07/2021   Procedure: BIV PPM UPGRADE;  Surgeon: Constance Haw, MD;  Location: [place] [facility];  Service: Cardiovascular;  Laterality: N/A;   LEFT HEART CATH AND CORONARY ANGIOGRAPHY N/A 04/19/2017   Procedure: Left Heart Cath and Coronary Angiography;  Surgeon: Belva Crome, MD;  Location: [place]  [facility];  Service: Cardiovascular;  Laterality: N/A;   PACEMAKER IMPLANT N/A 04/16/2021   Procedure: PACEMAKER IMPLANT;  Surgeon: Constance Haw, MD;  Location: [place] [facility];  Service: Cardiovascular;  Laterality: N/A;   RIGHT/LEFT HEART CATH AND CORONARY ANGIOGRAPHY N/A 08/22/2021   Procedure: RIGHT/LEFT HEART CATH AND CORONARY ANGIOGRAPHY;  Surgeon: Jolaine Artist, MD;  Location: [facility];  Service: Cardiovascular;  Laterality: N/A;   TONSILLECTOMY      Current Outpatient Medications  Medication Sig Dispense Refill   albuterol (PROVENTIL) (2.5 MG/3ML) 0.083% nebulizer solution Take 3 mLs (2.5 mg total) by nebulization every 4 (four) hours  as needed for wheezing or shortness of breath. 75 mL 11   albuterol (VENTOLIN HFA) 108 (90 Base) MCG/ACT inhaler INHALE 2 PUFFS INTO THE LUNGS EVERY 6 HOURS AS NEEDED FOR WHEEZING 18 g 2   allopurinol (ZYLOPRIM) 300 MG tablet Take 1 tablet (300 mg total) by mouth daily. 90 tablet 0   ALPRAZolam (XANAX) 0.5 MG tablet Take 1 tablet (0.5 mg total) by mouth 2 (two) times daily as needed for anxiety. 180 tablet 0   AMBULATORY NON FORMULARY MEDICATION Medication Name: Nitroglycerin gel 0.125% apply 2-3 times daily to the anal canal. (Patient taking differently: Place 1 application. rectally in the morning, at  noon, and at bedtime. Medication Name: Nitroglycerin gel 0.125% apply 2-3 times daily to the anal canal.) 30 g 1   aspirin EC 81 MG tablet Take 81 mg by mouth daily. Swallow whole.     benzonatate (TESSALON) 200 MG capsule Take 1 capsule (200 mg total) by mouth 3 (three) times daily as needed for cough. 30 capsule 1   cholecalciferol (VITAMIN D3) 25 MCG (1000 UNIT) tablet Take 1,000 Units by mouth daily.     colchicine 0.6 MG tablet Take 0.6 mg by mouth daily as needed.     EPINEPHrine 0.3 mg/0.3 mL IJ SOAJ injection Inject 0.3 mg into the muscle as needed for anaphylaxis.     fluticasone (FLONASE) 50 MCG/ACT nasal spray SHAKE LIQUID AND USE 2 SPRAYS IN EACH NOSTRIL DAILY 16 g 2   Glucosamine HCl (GLUCOSAMINE PO) Take 1 tablet by mouth daily.     guaiFENesin (MUCINEX) 600 MG 12 hr tablet Take 1 tablet (600 mg total) by mouth 2 (two) times daily. 60 tablet 5   ipratropium (ATROVENT) 0.06 % nasal spray Place 2 sprays into both nostrils 4 (four) times daily. 15 mL 3   levofloxacin (LEVAQUIN) 750 MG tablet Take 1 tablet (750 mg total) by mouth daily for 7 days. 7 tablet 0   Lidocaine, Anorectal, 5 % CREA Apply 1 application topically 2 (two) times daily. 30 g 1   losartan (COZAAR) 25 MG tablet Take 0.5 tablets (12.5 mg total) by mouth at bedtime. 15 tablet 3   mometasone-formoterol (DULERA) 100-5 MCG/ACT AERO Inhale 2 puffs into the lungs 2 (two) times daily.     montelukast (SINGULAIR) 10 MG tablet Take 10 mg by mouth at bedtime.     nicotine (NICODERM CQ - DOSED IN MG/24 HOURS) 14 mg/24hr patch Place 1 patch (14 mg total) onto the skin daily. 28 patch 0   omeprazole (PRILOSEC) 20 MG capsule Take 1 capsule (20 mg total) by mouth 2 (two) times daily before a meal. ACID REFLUX 180 capsule 1   polyethylene glycol (MIRALAX / GLYCOLAX) 17 g packet Take 17 g by mouth daily as needed. 14 each 0   potassium chloride SA (KLOR-CON M20) 20 MEQ tablet Take 1 tablet (20 mEq total) by mouth daily. 90 tablet 3    predniSONE (DELTASONE) 10 MG tablet TAKE 1 TABLET(10 MG) BY MOUTH DAILY WITH BREAKFAST 30 tablet 5   predniSONE (DELTASONE) 10 MG tablet Take 4 tabs by mouth for 3 days, then 3 for 3 days, 2 for 3 days, 1 for 3 days and stop 30 tablet 0   Roflumilast (DALIRESP) 250 MCG TABS Take 1 tablet by mouth daily. 30 tablet 11   Tiotropium Bromide Monohydrate (SPIRIVA RESPIMAT) 2.5 MCG/ACT AERS Inhale 2 puffs into the lungs daily. 4 g 4   torsemide (DEMADEX) 20 MG tablet Take 2  tablets (40 mg total) by mouth 2 (two) times daily. 180 tablet 3   traMADol (ULTRAM) 50 MG tablet Take 1 tablet (50 mg total) by mouth every 8 (eight) hours as needed. 90 tablet 2   vitamin B-12 (CYANOCOBALAMIN) 1000 MCG tablet Take 1,000 mcg by mouth daily.     No current facility-administered medications for this visit.    Allergies:   Spironolactone, Daliresp [roflumilast], Jardiance [empagliflozin], Penicillins, Clindamycin, Doxycycline, E-mycin [erythromycin base], and Rosuvastatin   Social History: Social History   Socioeconomic History   Marital status: Single    Spouse name: Not on file   Number of children: 1   Years of education: 12   Highest education level: Not on file  Occupational History   Occupation: disabled  Tobacco Use   Smoking status: Former    Packs/day: 2.00    Years: 46.00    Pack years: 92.00    Types: Cigarettes    Quit date: 01/27/2022    Years since quitting: 0.3   Smokeless tobacco: Never   Tobacco comments:    Had 3 cigarettes in past 2 weeks ARJ 02/27/22  Vaping Use   Vaping Use: Never used  Substance and Sexual Activity   Alcohol use: Not Currently    Alcohol/week: 28.0 standard drinks    Types: 28 Cans of beer per week   Drug use: No   Sexual activity: Yes    Partners: Female    Birth control/protection: None  Other Topics Concern   Not on file  Social History Narrative   Right handed   Tea sometimes and coffee (1/2 caffeine)   Social Determinants of Health   Financial  Resource Strain: Not on file  Food Insecurity: Not on file  Transportation Needs: Not on file  Physical Activity: Not on file  Stress: Not on file  Social Connections: Not on file  Intimate Partner Violence: Not on file    Family History: Family History  Problem Relation Age of Onset   Lung cancer Father    High blood pressure Mother    Diabetes Maternal Grandmother    Colon polyps Neg Hx    Esophageal cancer Neg Hx    Pancreatic cancer Neg Hx    Stomach cancer Neg Hx     Review of Systems: Review of systems complete and found to be negative unless listed in HPI.     Physical Exam: There were no vitals filed for this visit.    General: Pleasant, NAD. No resp difficulty Psych: Normal affect. HEENT:  Normal, without mass or lesion.         Neck: Supple, no bruits or JVD. Carotids 2+. No lymphadenopathy/thyromegaly appreciated. Heart: PMI nondisplaced. RRR no s3, s4, or murmurs. Lungs:  Resp regular and unlabored, CTA. Abdomen: Soft, non-tender, non-distended, No HSM, BS + x 4.   Extremities: No clubbing, cyanosis or edema. DP/PT/Radials 2+ and equal bilaterally. Neuro: Alert and oriented X 3. Moves all extremities spontaneously.   ICD interrogation- Not performed today  Barostim interrogation: Performed personally. See scanned report.   EKG:  EKG is not ordered today.  Recent Labs: 02/16/2022: NT-Pro BNP 1,090 05/21/2022: ALT 24; B Natriuretic Peptide 221.9; BUN 22; Creatinine, Ser 0.97; Hemoglobin 15.6; Platelets 208; Potassium 3.8; Sodium 142   Wt Readings from Last 3 Encounters:  05/21/22 224 lb 12.8 oz (102 kg)  05/21/22 224 lb 12.8 oz (102 kg)  04/29/22 228 lb 3.2 oz (103.5 kg)     Other studies Reviewed: Additional studies/  records that were reviewed today include: Previous EP and vascular office notes.   Assessment and Plan:  1.  Chronic systolic dysfunction s/p St. Jude  Dual chamber PPM S/p Barostim (commercial) 01/29/2022 Volume status looks good on  exam..  Continue torsemide 40 mg BID. Encouraged lasix 80 mg x 2 days. Labs today.  NYHA III symptoms currently.  Stable on an appropriate medical regimen Device titrated from 6.4 ma @ 65 ms. We were unable to titrate high with "lump in his throat" at anywhere between 6.6 - 7.2.  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 Encouraged to remain active.  He needs to restart his losartan.   2. ? Atrial fibrillation Previously monitor strips without clear AF Continue to monitor via device.    3. NICM Echo 04/16/21 LVEF 30-35%  Volume status stable on exam.  GDMT has been limited by intolerances. BB has been limited by GI side effects. Followed by CHF team.  4. Fatigue Out of proportion to exam and despite multiple adjustments to his cardiac medications and work up.  I have recommended he follow up with his PCP for work up of non-cardiac fatigue.    Current medicines are reviewed at length with the patient today.    Labs/ tests ordered today include:  No orders of the defined types were placed in this encounter.  Disposition:   Follow up with EP APP in 2 months for further adjustment.     Jacalyn Lefevre, PA-C  05/25/2022 9:52 AM  [facility] [address] [place] [address] [phone] (office) [phone] (fax)

## 2022-05-26 ENCOUNTER — Ambulatory Visit: Payer: 59 | Admitting: Emergency Medicine

## 2022-05-26 ENCOUNTER — Telehealth: Payer: Self-pay | Admitting: Physical Medicine and Rehabilitation

## 2022-05-26 LAB — CULTURE, BLOOD (ROUTINE X 2)
Culture: NO GROWTH
Special Requests: ADEQUATE

## 2022-05-26 NOTE — Telephone Encounter (Signed)
Patient called needing an earlier appointment with Dr Ernestina Patches. Patient said he is hurting worse now. The number to contact patient is [phone]

## 2022-05-26 NOTE — Progress Notes (Signed)
Electrophysiology Office Note Date: 05/28/2022  ID:  [name], DOB 14-Feb-1956, MRN [id number]  PCP: Janith Lima, MD Primary Cardiologist: Quay Burow, MD Electrophysiologist: Constance Haw, MD   CC: Routine ICD follow-up  Robert Church is a 66 y.o. male seen today for Robert Meredith Leeds, MD for routine electrophysiology followup.  Pt underwent Barostim implantation 01/29/2022 (commercial)  His goals for device titration are to walk into a building without SOB. Get to the point where he can ride motorcycle again (strength wise). He has sold it.   At last visit, any attempts to titrate device above 6.4 ma resulted in stim, so this is where he has been left.   Pt called office 5/30 with complains of syncopal episodes.  Noted he had been on antibiotics with a URI and reported O2 in the 80s at times.  He had previously called IM office for same and was recommended to go to ER, but pt declined.   Pt did presented to ER 05/21/2022 and was treated for CAP. Noted to have desat down into 80s on room air.  He was recommended for admission and left AMA because he could not arrange care for his pets.   Today, his main complaints are continued cough, fatigue, and vertigo. Seeing PCP for same. Also having significant back pain.  He states overall he was feeling better from a HF perspective before getting sick several weeks ago. Was doing more activity without issue.  He continues to have an intermittent lump and pain in his left neck, felt to be 2/2 BAT stimulation. He has been having vertiginous dizziness as well since his PNA.   Device History: St. Jude Dual Chamber PPM implanted 03/2021 for CHB. Failed Upgrade attempt 10/07/2021 Barostim implantation 01/29/2022   Past Medical History:  Diagnosis Date   Adenomatous colon polyp    Allergy    Anal fissure    Arthritis    Asthma    Bronchitis    CHF (congestive heart failure) ([place])    COPD, group D, by GOLD 2017 classification  ([place])    Dyspnea    Emphysema lung (HCC)    GERD (gastroesophageal reflux disease)    History of hiatal hernia    Hyperlipidemia    Hypertension    NICM (nonischemic cardiomyopathy) (HCC)    OSA (obstructive sleep apnea)    Pneumonia    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   Requires supplemental oxygen    Sinusitis    SOB (shortness of breath)    Past Surgical History:  Procedure Laterality Date   BIV UPGRADE N/A 10/07/2021   Procedure: BIV PPM UPGRADE;  Surgeon: Constance Haw, MD;  Location: [facility];  Service: Cardiovascular;  Laterality: N/A;   LEFT HEART CATH AND CORONARY ANGIOGRAPHY N/A 04/19/2017   Procedure: Left Heart Cath and Coronary Angiography;  Surgeon: Belva Crome, MD;  Location: [facility];  Service: Cardiovascular;  Laterality: N/A;   PACEMAKER IMPLANT N/A 04/16/2021   Procedure: PACEMAKER IMPLANT;  Surgeon: Constance Haw, MD;  Location: [facility];  Service: Cardiovascular;  Laterality: N/A;   RIGHT/LEFT HEART CATH AND CORONARY ANGIOGRAPHY N/A 08/22/2021   Procedure: RIGHT/LEFT HEART CATH AND CORONARY ANGIOGRAPHY;  Surgeon: Jolaine Artist, MD;  Location: [facility];  Service: Cardiovascular;  Laterality: N/A;   TONSILLECTOMY      Current Outpatient Medications  Medication Sig Dispense Refill   albuterol (  PROVENTIL) (2.5 MG/3ML) 0.083% nebulizer solution Take 3 mLs (2.5 mg total) by nebulization every 4 (four) hours as needed for wheezing or shortness of breath. 75 mL 11   albuterol (VENTOLIN HFA) 108 (90 Base) MCG/ACT inhaler INHALE 2 PUFFS INTO THE LUNGS EVERY 6 HOURS AS NEEDED FOR WHEEZING 18 g 2   allopurinol (ZYLOPRIM) 300 MG tablet Take 1 tablet (300 mg total) by mouth daily. 90 tablet 0   ALPRAZolam (XANAX) 0.5 MG tablet Take 1 tablet (0.5 mg total) by mouth 2 (two) times daily as needed for anxiety. 180 tablet 0   AMBULATORY NON FORMULARY MEDICATION Medication Name: Nitroglycerin gel 0.125%  apply 2-3 times daily to the anal canal. (Patient taking differently: Place 1 application. rectally in the morning, at noon, and at bedtime. Medication Name: Nitroglycerin gel 0.125% apply 2-3 times daily to the anal canal.) 30 g 1   aspirin EC 81 MG tablet Take 81 mg by mouth daily. Swallow whole.     cholecalciferol (VITAMIN D3) 25 MCG (1000 UNIT) tablet Take 1,000 Units by mouth daily.     colchicine 0.6 MG tablet Take 0.6 mg by mouth daily as needed.     EPINEPHrine 0.3 mg/0.3 mL IJ SOAJ injection Inject 0.3 mg into the muscle as needed for anaphylaxis.     fluticasone (FLONASE) 50 MCG/ACT nasal spray SHAKE LIQUID AND USE 2 SPRAYS IN EACH NOSTRIL DAILY 16 g 2   Glucosamine HCl (GLUCOSAMINE PO) Take 1 tablet by mouth daily.     guaiFENesin (MUCINEX) 600 MG 12 hr tablet Take 1 tablet (600 mg total) by mouth 2 (two) times daily. 60 tablet 5   ipratropium (ATROVENT) 0.06 % nasal spray Place 2 sprays into both nostrils 4 (four) times daily. 15 mL 3   levofloxacin (LEVAQUIN) 750 MG tablet Take 1 tablet (750 mg total) by mouth daily for 7 days. 7 tablet 0   Lidocaine, Anorectal, 5 % CREA Apply 1 application topically 2 (two) times daily. 30 g 1   losartan (COZAAR) 25 MG tablet Take 0.5 tablets (12.5 mg total) by mouth at bedtime. 15 tablet 3   mometasone-formoterol (DULERA) 100-5 MCG/ACT AERO Inhale 2 puffs into the lungs 2 (two) times daily.     montelukast (SINGULAIR) 10 MG tablet Take 10 mg by mouth at bedtime.     nicotine (NICODERM CQ - DOSED IN MG/24 HOURS) 14 mg/24hr patch Place 1 patch (14 mg total) onto the skin daily. 28 patch 0   omeprazole (PRILOSEC) 20 MG capsule Take 1 capsule (20 mg total) by mouth 2 (two) times daily before a meal. ACID REFLUX 180 capsule 1   polyethylene glycol (MIRALAX / GLYCOLAX) 17 g packet Take 17 g by mouth daily as needed. 14 each 0   potassium chloride SA (KLOR-CON M20) 20 MEQ tablet Take 1 tablet (20 mEq total) by mouth daily. 90 tablet 3   predniSONE  (DELTASONE) 10 MG tablet TAKE 1 TABLET(10 MG) BY MOUTH DAILY WITH BREAKFAST 30 tablet 5   predniSONE (DELTASONE) 10 MG tablet Take 4 tabs by mouth for 3 days, then 3 for 3 days, 2 for 3 days, 1 for 3 days and stop 30 tablet 0   Roflumilast (DALIRESP) 250 MCG TABS Take 1 tablet by mouth daily. 30 tablet 11   Tiotropium Bromide Monohydrate (SPIRIVA RESPIMAT) 2.5 MCG/ACT AERS Inhale 2 puffs into the lungs daily. 4 g 4   torsemide (DEMADEX) 20 MG tablet Take 2 tablets (40 mg total) by mouth 2 (two)  times daily. 180 tablet 3   traMADol (ULTRAM) 50 MG tablet Take 1 tablet (50 mg total) by mouth every 8 (eight) hours as needed. 90 tablet 2   vitamin B-12 (CYANOCOBALAMIN) 1000 MCG tablet Take 1,000 mcg by mouth daily.     No current facility-administered medications for this visit.    Allergies:   Spironolactone, Daliresp [roflumilast], Jardiance [empagliflozin], Penicillins, Clindamycin, Doxycycline, E-mycin [erythromycin base], and Rosuvastatin   Social History: Social History   Socioeconomic History   Marital status: Single    Spouse name: Not on file   Number of children: 1   Years of education: 12   Highest education level: Not on file  Occupational History   Occupation: disabled  Tobacco Use   Smoking status: Former    Packs/day: 2.00    Years: 46.00    Total pack years: 92.00    Types: Cigarettes    Quit date: 01/27/2022    Years since quitting: 0.3   Smokeless tobacco: Never   Tobacco comments:    Had 3 cigarettes in past 2 weeks ARJ 02/27/22  Vaping Use   Vaping Use: Never used  Substance and Sexual Activity   Alcohol use: Not Currently    Alcohol/week: 28.0 standard drinks of alcohol    Types: 28 Cans of beer per week   Drug use: No   Sexual activity: Yes    Partners: Female    Birth control/protection: None  Other Topics Concern   Not on file  Social History Narrative   Right handed   Tea sometimes and coffee (1/2 caffeine)   Social Determinants of Health    Financial Resource Strain: Medium Risk (04/24/2021)   Overall Financial Resource Strain (CARDIA)    Difficulty of Paying Living Expenses: Somewhat hard  Food Insecurity: Not on file  Transportation Needs: Not on file  Physical Activity: Not on file  Stress: Not on file  Social Connections: Not on file  Intimate Partner Violence: Not on file    Family History: Family History  Problem Relation Age of Onset   Lung cancer Father    High blood pressure Mother    Diabetes Maternal Grandmother    Colon polyps Neg Hx    Esophageal cancer Neg Hx    Pancreatic cancer Neg Hx    Stomach cancer Neg Hx     Review of Systems: Review of systems complete and found to be negative unless listed in HPI.     Physical Exam: Vitals:   05/28/22 1209  BP: 108/68  Pulse: 73  SpO2: 93%  Weight: 222 lb 12.8 oz (101.1 kg)  Height: '5\' 10"'$  (1.778 m)      General: Pleasant, NAD. No resp difficulty Psych: Normal affect. HEENT:  Normal, without mass or lesion.         Neck: Supple, no bruits or JVD. Carotids 2+. No lymphadenopathy/thyromegaly appreciated. Heart: PMI nondisplaced. RRR no s3, s4, or murmurs. Lungs:  Rhonchi bi-basilar sounds that clear with coughing.  Abdomen: Soft, non-tender, non-distended, No HSM, BS + x 4.   Extremities: No clubbing, cyanosis or edema. DP/PT/Radials 2+ and equal bilaterally. Neuro: Alert and oriented X 3. Moves all extremities spontaneously.   PPM interrogation- Performed personally today, see PaceART.   Barostim interrogation: Performed personally. See scanned report.   EKG:  EKG is not ordered today.  Recent Labs: 02/16/2022: NT-Pro BNP 1,090 05/21/2022: ALT 24; B Natriuretic Peptide 221.9; BUN 22; Creatinine, Ser 0.97; Hemoglobin 15.6; Platelets 208; Potassium 3.8; Sodium 142  Wt Readings from Last 3 Encounters:  05/28/22 222 lb 12.8 oz (101.1 kg)  05/21/22 224 lb 12.8 oz (102 kg)  05/21/22 224 lb 12.8 oz (102 kg)     Other studies  Reviewed: Additional studies/ records that were reviewed today include: Previous EP and vascular office notes.   Assessment and Plan:  1.  Chronic systolic dysfunction s/p St. Jude  Dual chamber PPM S/p Barostim (commercial) 01/29/2022 Volume status looks good on exam..  Continue torsemide 40 mg BID.  NYHA III symptoms currently in setting of recent PNA.  Stable on an appropriate medical regimen Device titrated down to 5.4 ms @ 65 ms due to recurrent lump and pain in left neck.   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 Encouraged to remain active.    2. ? Atrial fibrillation Previously monitor strips without clear AF Continue to monitor via device.    3. NICM Echo 04/16/21 LVEF 30-35%  Volume status stable on exam.  GDMT has been limited by intolerances. BB has been limited by GI side effects. Followed by CHF team.  4. Fatigue Out of proportion to exam and despite multiple adjustments to his cardiac medications and work up.  I have recommended he follow up with his PCP for work up of non-cardiac fatigue.    5. Fall With syncope. Likely in the setting of PNA and hypoxia.  Device interrogation unremarkable.   6. Recent PNA Remains fatigue.  Rhonchi with  lung sounds that clears. Knows to call PCP or report to ED with any worsening.   Current medicines are reviewed at length with the patient today.     Disposition:   Follow up with EP APP in 3 months for further adjustment.  His HF seems well compensated currently despite PNA.    Jacalyn Lefevre, PA-C  05/28/2022 1:16 PM  [address] [place] [address] [phone] (office) [phone] (fax)

## 2022-05-27 LAB — CULTURE, BLOOD (ROUTINE X 2)
Culture: NO GROWTH
Special Requests: ADEQUATE

## 2022-05-28 ENCOUNTER — Ambulatory Visit (INDEPENDENT_AMBULATORY_CARE_PROVIDER_SITE_OTHER): Payer: 59 | Admitting: Student

## 2022-05-28 ENCOUNTER — Encounter: Payer: Self-pay | Admitting: Student

## 2022-05-28 ENCOUNTER — Telehealth: Payer: Self-pay | Admitting: Internal Medicine

## 2022-05-28 VITALS — BP 108/68 | HR 73 | Ht 70.0 in | Wt 222.8 lb

## 2022-05-28 DIAGNOSIS — G4733 Obstructive sleep apnea (adult) (pediatric): Secondary | ICD-10-CM

## 2022-05-28 DIAGNOSIS — I5042 Chronic combined systolic (congestive) and diastolic (congestive) heart failure: Secondary | ICD-10-CM

## 2022-05-28 DIAGNOSIS — I442 Atrioventricular block, complete: Secondary | ICD-10-CM

## 2022-05-28 LAB — CUP PACEART INCLINIC DEVICE CHECK
Battery Remaining Longevity: 99 mo
Battery Voltage: 2.99 V
Brady Statistic RA Percent Paced: 13 %
Brady Statistic RV Percent Paced: 66 %
Date Time Interrogation Session: 20230608131623
Implantable Lead Implant Date: 20220427
Implantable Lead Implant Date: 20220427
Implantable Lead Location: 753859
Implantable Lead Location: 753860
Implantable Pulse Generator Implant Date: 20220427
Lead Channel Impedance Value: 475 Ohm
Lead Channel Impedance Value: 600 Ohm
Lead Channel Pacing Threshold Amplitude: 0.5 V
Lead Channel Pacing Threshold Amplitude: 0.5 V
Lead Channel Pacing Threshold Amplitude: 0.75 V
Lead Channel Pacing Threshold Amplitude: 0.75 V
Lead Channel Pacing Threshold Pulse Width: 0.5 ms
Lead Channel Pacing Threshold Pulse Width: 0.5 ms
Lead Channel Pacing Threshold Pulse Width: 0.5 ms
Lead Channel Pacing Threshold Pulse Width: 0.5 ms
Lead Channel Sensing Intrinsic Amplitude: 12 mV
Lead Channel Sensing Intrinsic Amplitude: 3.4 mV
Lead Channel Setting Pacing Amplitude: 2 V
Lead Channel Setting Pacing Amplitude: 2.5 V
Lead Channel Setting Pacing Pulse Width: 0.5 ms
Lead Channel Setting Sensing Sensitivity: 2 mV
Pulse Gen Model: 2272
Pulse Gen Serial Number: 3920156

## 2022-05-28 NOTE — Telephone Encounter (Signed)
Patient fell and hit the back of his head 2 weeks ago.  Patient did not go to the doctor for this.  Since then he has been experiencing headaches and dizzy spells.  He is experiencing vertigo today.  He would like to know if he can be scheduled for a CT scan - Please advise

## 2022-05-28 NOTE — Telephone Encounter (Signed)
I advised the pt that CMA advised him to report to the ER.  FYI

## 2022-05-28 NOTE — Patient Instructions (Signed)

## 2022-05-28 NOTE — Telephone Encounter (Signed)
Patient fell and hit the back of his head 2 weeks ago.  Patient did not go to the doctor for this.  Since then he has been experiencing headaches and dizzy spells.  He is experiencing vertigo today.  He would like to know if he can be scheduled for a CT scan - Please advise  Pt is calling to see if we could schedule him an appt however no available appts.  Please advise

## 2022-06-05 ENCOUNTER — Encounter: Payer: Self-pay | Admitting: Internal Medicine

## 2022-06-05 ENCOUNTER — Ambulatory Visit (INDEPENDENT_AMBULATORY_CARE_PROVIDER_SITE_OTHER): Payer: 59 | Admitting: Internal Medicine

## 2022-06-05 ENCOUNTER — Ambulatory Visit (INDEPENDENT_AMBULATORY_CARE_PROVIDER_SITE_OTHER): Payer: 59

## 2022-06-05 VITALS — BP 132/76 | HR 85 | Temp 98.6°F | Ht 70.0 in | Wt 221.8 lb

## 2022-06-05 DIAGNOSIS — R519 Headache, unspecified: Secondary | ICD-10-CM

## 2022-06-05 DIAGNOSIS — M542 Cervicalgia: Secondary | ICD-10-CM

## 2022-06-05 DIAGNOSIS — R7303 Prediabetes: Secondary | ICD-10-CM

## 2022-06-05 DIAGNOSIS — M545 Low back pain, unspecified: Secondary | ICD-10-CM | POA: Diagnosis not present

## 2022-06-05 DIAGNOSIS — J189 Pneumonia, unspecified organism: Secondary | ICD-10-CM

## 2022-06-05 DIAGNOSIS — Z8739 Personal history of other diseases of the musculoskeletal system and connective tissue: Secondary | ICD-10-CM | POA: Diagnosis not present

## 2022-06-05 DIAGNOSIS — R55 Syncope and collapse: Secondary | ICD-10-CM

## 2022-06-05 DIAGNOSIS — R42 Dizziness and giddiness: Secondary | ICD-10-CM

## 2022-06-05 DIAGNOSIS — E291 Testicular hypofunction: Secondary | ICD-10-CM

## 2022-06-05 DIAGNOSIS — W19XXXA Unspecified fall, initial encounter: Secondary | ICD-10-CM

## 2022-06-05 LAB — BASIC METABOLIC PANEL
BUN: 24 mg/dL — ABNORMAL HIGH (ref 6–23)
CO2: 31 mEq/L (ref 19–32)
Calcium: 9.3 mg/dL (ref 8.4–10.5)
Chloride: 99 mEq/L (ref 96–112)
Creatinine, Ser: 0.95 mg/dL (ref 0.40–1.50)
GFR: 85.02 mL/min (ref >60.00)
Glucose, Bld: 107 mg/dL — ABNORMAL HIGH (ref 70–99)
Potassium: 4.3 mEq/L (ref 3.5–5.1)
Sodium: 138 mEq/L (ref 135–145)

## 2022-06-05 LAB — HEPATIC FUNCTION PANEL
ALT: 19 U/L (ref 0–53)
AST: 16 U/L (ref 0–37)
Albumin: 4.1 g/dL (ref 3.5–5.2)
Alkaline Phosphatase: 52 U/L (ref 39–117)
Bilirubin, Direct: 0.2 mg/dL (ref 0.0–0.3)
Total Bilirubin: 0.7 mg/dL (ref 0.2–1.2)
Total Protein: 6.6 g/dL (ref 6.0–8.3)

## 2022-06-05 LAB — CBC WITH DIFFERENTIAL/PLATELET
Basophils Absolute: 0 10*3/uL (ref 0.0–0.1)
Basophils Relative: 0.2 % (ref 0.0–3.0)
Eosinophils Absolute: 0 10*3/uL (ref 0.0–0.7)
Eosinophils Relative: 0.3 % (ref 0.0–5.0)
HCT: 42.2 % (ref 39.0–52.0)
Hemoglobin: 14.3 g/dL (ref 13.0–17.0)
Lymphocytes Relative: 8.8 % — ABNORMAL LOW (ref 12.0–46.0)
Lymphs Abs: 0.7 10*3/uL (ref 0.7–4.0)
MCHC: 34 g/dL (ref 30.0–36.0)
MCV: 97.2 fl (ref 78.0–100.0)
Monocytes Absolute: 0.4 10*3/uL (ref 0.1–1.0)
Monocytes Relative: 5.7 % (ref 3.0–12.0)
Neutro Abs: 6.4 10*3/uL (ref 1.4–7.7)
Neutrophils Relative %: 85 % — ABNORMAL HIGH (ref 43.0–77.0)
Platelets: 196 10*3/uL (ref 150.0–400.0)
RBC: 4.34 Mil/uL (ref 4.22–5.81)
RDW: 12.5 % (ref 11.5–15.5)
WBC: 7.5 10*3/uL (ref 4.0–10.5)

## 2022-06-05 LAB — URINALYSIS, ROUTINE W REFLEX MICROSCOPIC
Hgb urine dipstick: NEGATIVE
Ketones, ur: 15 — AB
Leukocytes,Ua: NEGATIVE
Nitrite: NEGATIVE
Specific Gravity, Urine: 1.02 (ref 1.000–1.030)
Total Protein, Urine: NEGATIVE
Urine Glucose: NEGATIVE
Urobilinogen, UA: 1 (ref 0.0–1.0)
pH: 6 (ref 5.0–8.0)

## 2022-06-05 LAB — URIC ACID: Uric Acid, Serum: 11 mg/dL — ABNORMAL HIGH (ref 4.0–7.8)

## 2022-06-05 LAB — HEMOGLOBIN A1C: Hgb A1c MFr Bld: 6.4 % (ref 4.6–6.5)

## 2022-06-05 LAB — TESTOSTERONE: Testosterone: 160.4 ng/dL — ABNORMAL LOW (ref 300.00–890.00)

## 2022-06-05 NOTE — Progress Notes (Unsigned)
Patient ID: Robert Church, male   DOB: Sep 21, 1956, 66 y.o.   MRN: [id number]        Chief Complaint: follow uprecent syncope and CAP, copd, chronic resp failure, now 1 wk post levaquin and ? increasing cough HA, dizziness, generalized weakness       HPI:  Robert Church is a 66 y.o. male here with c/o           Wt Readings from Last 3 Encounters:  06/05/22 221 lb 12.8 oz (100.6 kg)  05/28/22 222 lb 12.8 oz (101.1 kg)  05/21/22 224 lb 12.8 oz (102 kg)   BP Readings from Last 3 Encounters:  06/05/22 132/76  05/28/22 108/68  05/21/22 (!) 144/99         Past Medical History:  Diagnosis Date   Adenomatous colon polyp    Allergy    Anal fissure    Arthritis    Asthma    Bronchitis    CHF (congestive heart failure) (HCC)    COPD, group D, by GOLD 2017 classification ([place])    Dyspnea    Emphysema lung (HCC)    GERD (gastroesophageal reflux disease)    History of hiatal hernia    Hyperlipidemia    Hypertension    NICM (nonischemic cardiomyopathy) (HCC)    OSA (obstructive sleep apnea)    Pneumonia    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   Requires supplemental oxygen    Sinusitis    SOB (shortness of breath)    Past Surgical History:  Procedure Laterality Date   BIV UPGRADE N/A 10/07/2021   Procedure: BIV PPM UPGRADE;  Surgeon: Constance Haw, MD;  Location: [facility];  Service: Cardiovascular;  Laterality: N/A;   LEFT HEART CATH AND CORONARY ANGIOGRAPHY N/A 04/19/2017   Procedure: Left Heart Cath and Coronary Angiography;  Surgeon: Belva Crome, MD;  Location: [facility];  Service: Cardiovascular;  Laterality: N/A;   PACEMAKER IMPLANT N/A 04/16/2021   Procedure: PACEMAKER IMPLANT;  Surgeon: Constance Haw, MD;  Location: [facility];  Service: Cardiovascular;  Laterality: N/A;   RIGHT/LEFT HEART CATH AND CORONARY ANGIOGRAPHY N/A 08/22/2021   Procedure: RIGHT/LEFT HEART CATH AND CORONARY ANGIOGRAPHY;  Surgeon: Jolaine Artist, MD;  Location: [facility];  Service: Cardiovascular;  Laterality: N/A;   TONSILLECTOMY      reports that he quit smoking about 4 months ago. His smoking use included cigarettes. He has a 92.00 pack-year smoking history. He has never used smokeless tobacco. He reports that he does not currently use alcohol after a past usage of about 28.0 standard drinks of alcohol per week. He reports that he does not use drugs. family history includes Diabetes in his maternal grandmother; High blood pressure in his mother; Lung cancer in his father. Allergies  Allergen Reactions   Spironolactone Anaphylaxis   Daliresp [Roflumilast] Other (See Comments)    Dizzy, headache, leg pain per patient. 02/25/22.    Jardiance [Empagliflozin] Nausea Only and Other (See Comments)    Lightheadness Dizziness   Penicillins Swelling and Other (See Comments)    Childhood rxn--MD stated he "almost died" Has patient had a PCN reaction causing immediate rash, facial/tongue/throat swelling, SOB or lightheadedness with hypotension:Yes Has patient had a PCN reaction causing severe rash involving mucus membranes or skin necrosis:unsure Has patient had a PCN reaction that required hospitalization:unsure Has patient had a PCN reaction occurring within the last 10 years:No  If all of the above answers are "NO", then may proceed with Cephalosporin use.     Clindamycin Other (See Comments)    Tongue swelling   Doxycycline Nausea And Vomiting    Severe stomach upset per patient   E-Mycin [Erythromycin Base] Swelling   Rosuvastatin Other (See Comments)    Myalgias (intolerance)   Current Outpatient Medications on File Prior to Visit  Medication Sig Dispense Refill   albuterol (PROVENTIL) (2.5 MG/3ML) 0.083% nebulizer solution Take 3 mLs (2.5 mg total) by nebulization every 4 (four) hours as needed for wheezing or shortness of breath. 75 mL 11   albuterol (VENTOLIN HFA) 108 (90 Base) MCG/ACT inhaler INHALE 2 PUFFS INTO  THE LUNGS EVERY 6 HOURS AS NEEDED FOR WHEEZING 18 g 2   allopurinol (ZYLOPRIM) 300 MG tablet Take 1 tablet (300 mg total) by mouth daily. 90 tablet 0   ALPRAZolam (XANAX) 0.5 MG tablet Take 1 tablet (0.5 mg total) by mouth 2 (two) times daily as needed for anxiety. 180 tablet 0   AMBULATORY NON FORMULARY MEDICATION Medication Name: Nitroglycerin gel 0.125% apply 2-3 times daily to the anal canal. (Patient taking differently: Place 1 application  rectally in the morning, at noon, and at bedtime. Medication Name: Nitroglycerin gel 0.125% apply 2-3 times daily to the anal canal.) 30 g 1   aspirin EC 81 MG tablet Take 81 mg by mouth daily. Swallow whole.     cholecalciferol (VITAMIN D3) 25 MCG (1000 UNIT) tablet Take 1,000 Units by mouth daily.     colchicine 0.6 MG tablet Take 0.6 mg by mouth daily as needed.     EPINEPHrine 0.3 mg/0.3 mL IJ SOAJ injection Inject 0.3 mg into the muscle as needed for anaphylaxis.     fluticasone (FLONASE) 50 MCG/ACT nasal spray SHAKE LIQUID AND USE 2 SPRAYS IN EACH NOSTRIL DAILY 16 g 2   Glucosamine HCl (GLUCOSAMINE PO) Take 1 tablet by mouth daily.     guaiFENesin (MUCINEX) 600 MG 12 hr tablet Take 1 tablet (600 mg total) by mouth 2 (two) times daily. 60 tablet 5   ipratropium (ATROVENT) 0.06 % nasal spray Place 2 sprays into both nostrils 4 (four) times daily. 15 mL 3   Lidocaine, Anorectal, 5 % CREA Apply 1 application topically 2 (two) times daily. 30 g 1   losartan (COZAAR) 25 MG tablet Take 0.5 tablets (12.5 mg total) by mouth at bedtime. 15 tablet 3   mometasone-formoterol (DULERA) 100-5 MCG/ACT AERO Inhale 2 puffs into the lungs 2 (two) times daily.     montelukast (SINGULAIR) 10 MG tablet Take 10 mg by mouth at bedtime.     nicotine (NICODERM CQ - DOSED IN MG/24 HOURS) 14 mg/24hr patch Place 1 patch (14 mg total) onto the skin daily. 28 patch 0   omeprazole (PRILOSEC) 20 MG capsule Take 1 capsule (20 mg total) by mouth 2 (two) times daily before a meal. ACID  REFLUX 180 capsule 1   polyethylene glycol (MIRALAX / GLYCOLAX) 17 g packet Take 17 g by mouth daily as needed. 14 each 0   potassium chloride SA (KLOR-CON M20) 20 MEQ tablet Take 1 tablet (20 mEq total) by mouth daily. 90 tablet 3   predniSONE (DELTASONE) 10 MG tablet TAKE 1 TABLET(10 MG) BY MOUTH DAILY WITH BREAKFAST 30 tablet 5   predniSONE (DELTASONE) 10 MG tablet Take 4 tabs by mouth for 3 days, then 3 for 3 days, 2 for 3 days, 1 for 3 days and stop 30  tablet 0   Roflumilast (DALIRESP) 250 MCG TABS Take 1 tablet by mouth daily. 30 tablet 11   Tiotropium Bromide Monohydrate (SPIRIVA RESPIMAT) 2.5 MCG/ACT AERS Inhale 2 puffs into the lungs daily. 4 g 4   torsemide (DEMADEX) 20 MG tablet Take 2 tablets (40 mg total) by mouth 2 (two) times daily. 180 tablet 3   traMADol (ULTRAM) 50 MG tablet Take 1 tablet (50 mg total) by mouth every 8 (eight) hours as needed. 90 tablet 2   vitamin B-12 (CYANOCOBALAMIN) 1000 MCG tablet Take 1,000 mcg by mouth daily.     No current facility-administered medications on file prior to visit.        ROS:  All others reviewed and negative.  Objective        PE:  BP 132/76 (BP Location: Left Arm, Patient Position: Sitting, Cuff Size: Large)   Pulse 85   Temp 98.6 F (37 C) (Oral)   Ht '5\' 10"'$  (1.778 m)   Wt 221 lb 12.8 oz (100.6 kg)   SpO2 94%   BMI 31.82 kg/m                 Constitutional: Pt appears in NAD               HENT: Head: NCAT.                Right Ear: External ear normal.                 Left Ear: External ear normal.                Eyes: . Pupils are equal, round, and reactive to light. Conjunctivae and EOM are normal               Nose: without d/c or deformity               Neck: Neck supple. Gross normal ROM               Cardiovascular: Normal rate and regular rhythm.                 Pulmonary/Chest: Effort normal and breath sounds without rales or wheezing.                Abd:  Soft, NT, ND, + BS, no organomegaly                Neurological: Pt is alert. At baseline orientation, motor grossly intact               Skin: Skin is warm. No rashes, no other new lesions, LE edema - ***               Psychiatric: Pt behavior is normal without agitation   Micro: none  Cardiac tracings I have personally interpreted today:  none  Pertinent Radiological findings (summarize): none   Lab Results  Component Value Date   WBC 9.6 05/21/2022   HGB 15.6 05/21/2022   HCT 46.4 05/21/2022   PLT 208 05/21/2022   GLUCOSE 152 (H) 05/21/2022   CHOL 191 10/20/2021   TRIG 110.0 10/20/2021   HDL 52.70 10/20/2021   LDLDIRECT 150.0 03/25/2021   LDLCALC 117 (H) 10/20/2021   ALT 24 05/21/2022   AST 20 05/21/2022   NA 142 05/21/2022   K 3.8 05/21/2022   CL 101 05/21/2022   CREATININE 0.97 05/21/2022   BUN 22 05/21/2022   CO2 27 05/21/2022   TSH  1.147 04/16/2021   PSA 1.7 07/10/2020   INR 0.9 01/29/2022   HGBA1C 5.6 10/20/2021   Assessment/Plan:  Robert Church is a 66 y.o. White or Caucasian [1] male with  has a past medical history of Adenomatous colon polyp, Allergy, Anal fissure, Arthritis, Asthma, Bronchitis, CHF (congestive heart failure) ([place]), COPD, group D, by GOLD 2017 classification ([place]), Dyspnea, Emphysema lung ([facility]), GERD (gastroesophageal reflux disease), History of hiatal hernia, Hyperlipidemia, Hypertension, NICM (nonischemic cardiomyopathy) ([place]), OSA (obstructive sleep apnea), Pneumonia, Presence of permanent cardiac pacemaker, Requires supplemental oxygen, Sinusitis, and SOB (shortness of breath).  No problem-specific Assessment & Plan notes found for this encounter.  Followup: No follow-ups on file.  Cathlean Cower, MD 06/05/2022 3:05 PM [facility]

## 2022-06-05 NOTE — Patient Instructions (Signed)
Please continue all other medications as before, and refills have been done if requested.  Please have the pharmacy call with any other refills you may need.  Please continue your efforts at being more active, low cholesterol diet, and weight control.  You are otherwise up to date with prevention measures today.  Please keep your appointments with your specialists as you may have planned  You will be contacted regarding the referral for: CT head (asap)  Please go to the XRAY Department in the first floor for the x-ray testing  Please go to the LAB at the blood drawing area for the tests to be done  You will be contacted by phone if any changes need to be made immediately.  Otherwise, you will receive a letter about your results with an explanation, but please check with MyChart first.  Please remember to sign up for MyChart if you have not done so, as this will be important to you in the future with finding out test results, communicating by private email, and scheduling acute appointments online when needed.

## 2022-06-07 ENCOUNTER — Encounter: Payer: Self-pay | Admitting: Internal Medicine

## 2022-06-07 DIAGNOSIS — R42 Dizziness and giddiness: Secondary | ICD-10-CM | POA: Insufficient documentation

## 2022-06-07 DIAGNOSIS — E291 Testicular hypofunction: Secondary | ICD-10-CM | POA: Insufficient documentation

## 2022-06-07 DIAGNOSIS — R519 Headache, unspecified: Secondary | ICD-10-CM | POA: Insufficient documentation

## 2022-06-07 DIAGNOSIS — M545 Low back pain, unspecified: Secondary | ICD-10-CM | POA: Insufficient documentation

## 2022-06-07 DIAGNOSIS — R55 Syncope and collapse: Secondary | ICD-10-CM | POA: Insufficient documentation

## 2022-06-07 DIAGNOSIS — R7303 Prediabetes: Secondary | ICD-10-CM | POA: Insufficient documentation

## 2022-06-07 DIAGNOSIS — Z8739 Personal history of other diseases of the musculoskeletal system and connective tissue: Secondary | ICD-10-CM | POA: Insufficient documentation

## 2022-06-07 DIAGNOSIS — M542 Cervicalgia: Secondary | ICD-10-CM | POA: Insufficient documentation

## 2022-06-07 DIAGNOSIS — W19XXXA Unspecified fall, initial encounter: Secondary | ICD-10-CM | POA: Insufficient documentation

## 2022-06-07 NOTE — Assessment & Plan Note (Signed)
Also for testosterone level per pt reqeust

## 2022-06-07 NOTE — Assessment & Plan Note (Signed)
Post fall with posterior head trauma, for cspine films r/o fx

## 2022-06-07 NOTE — Assessment & Plan Note (Signed)
Lab Results  Component Value Date   HGBA1C 6.4 06/05/2022   Stable, pt to continue current medical treatment  - diet, wt control

## 2022-06-07 NOTE — Assessment & Plan Note (Signed)
With some increased prod cough in last 2 days, for f/u cxr , cbc today

## 2022-06-07 NOTE — Assessment & Plan Note (Signed)
With dizziness and post head trauma with fall, unable for MRI due to PPM, now for head CT

## 2022-06-07 NOTE — Assessment & Plan Note (Signed)
With synocopal episode striking post head neck and lumbar, no overt evidence of trauma now,  to f/u any worsening symptoms or concerns

## 2022-06-07 NOTE — Assessment & Plan Note (Signed)
Also for uric acid per pt request, no evidence of acute gout today

## 2022-06-07 NOTE — Assessment & Plan Note (Signed)
I suspect c/w msk strain, but for films r/o underlying fx

## 2022-06-07 NOTE — Assessment & Plan Note (Signed)
Etiology unclear but attributed to copd exacerbation and acute on chronic resp failure per pt report; declines other eval for now or cardio neurology referrals

## 2022-06-08 ENCOUNTER — Other Ambulatory Visit (HOSPITAL_COMMUNITY): Payer: Self-pay | Admitting: Adult Health

## 2022-06-08 ENCOUNTER — Encounter (HOSPITAL_COMMUNITY): Payer: Self-pay | Admitting: *Deleted

## 2022-06-08 ENCOUNTER — Telehealth: Payer: Self-pay | Admitting: Internal Medicine

## 2022-06-08 ENCOUNTER — Other Ambulatory Visit: Payer: Self-pay | Admitting: Emergency Medicine

## 2022-06-08 DIAGNOSIS — R42 Dizziness and giddiness: Secondary | ICD-10-CM

## 2022-06-08 NOTE — Telephone Encounter (Signed)
Pt requesting lab results from xray and prior lab.  Informed pat of notes but pt had further questions  Requesting call back from assistant.Scheduled follow up visit for 7/10

## 2022-06-08 NOTE — Telephone Encounter (Signed)
Pt stated that office for MRI doesn't accept his insurance requesting it be sent to different provider

## 2022-06-09 ENCOUNTER — Telehealth (HOSPITAL_COMMUNITY): Payer: Self-pay | Admitting: *Deleted

## 2022-06-09 DIAGNOSIS — I5042 Chronic combined systolic (congestive) and diastolic (congestive) heart failure: Secondary | ICD-10-CM

## 2022-06-09 NOTE — Telephone Encounter (Signed)
Pt called in regarding these messages.   Requesting call back from assistant ASAP a he states "I have continuously called."

## 2022-06-09 NOTE — Telephone Encounter (Signed)
Pt said he's noticed the dizziness since he "started the medication in April".

## 2022-06-09 NOTE — Telephone Encounter (Signed)
Pt called stating since starting torsemide he's been dizzy. Pt said he is dizzy 8 hours a day.  Pts bp is 116/70, weight stable, denies swelling.  Pt said he isnt any more short of breath from baseline. Pt said he only took one torsemide yesterday and ws still a little dizzy. Pt is prescribed torsemide '40mg'$  bid.   Routed to [employer]

## 2022-06-09 NOTE — Telephone Encounter (Signed)
Pt has been informed of results from 6/16 OV w/ Dr. Cathlean Cower.   He stated he has a f/u with PCP on 7/10. However, he is requesting a referral to [facility] for his dizziness. He stated that he has been to that practice before a while ago. Pt stated that he made contact but was informed that he would need a new referral to schedule an OV with them. Please advise.

## 2022-06-11 ENCOUNTER — Telehealth: Payer: Self-pay | Admitting: Internal Medicine

## 2022-06-11 DIAGNOSIS — R519 Headache, unspecified: Secondary | ICD-10-CM

## 2022-06-11 DIAGNOSIS — M542 Cervicalgia: Secondary | ICD-10-CM

## 2022-06-11 DIAGNOSIS — R42 Dizziness and giddiness: Secondary | ICD-10-CM

## 2022-06-11 NOTE — Telephone Encounter (Signed)
PT was referred to [facility] for CT head WO contrast. He stated they are out of network. He would would to be referred to [place] at [facility].   Please advise

## 2022-06-11 NOTE — Telephone Encounter (Signed)
Ok this is done 

## 2022-06-11 NOTE — Addendum Note (Signed)
Addended by: Biagio Borg on: 06/11/2022 04:25 PM   Modules accepted: Orders

## 2022-06-15 ENCOUNTER — Telehealth: Payer: Self-pay | Admitting: Internal Medicine

## 2022-06-15 ENCOUNTER — Encounter: Payer: 59 | Admitting: Physical Medicine and Rehabilitation

## 2022-06-17 ENCOUNTER — Other Ambulatory Visit: Payer: Self-pay | Admitting: Emergency Medicine

## 2022-06-17 NOTE — Telephone Encounter (Signed)
Pt will have to to be sent [facility] due to the other location not taking the insurance.

## 2022-06-17 NOTE — Telephone Encounter (Signed)
Ok this is done 

## 2022-06-17 NOTE — Telephone Encounter (Signed)
My apologies..

## 2022-06-19 ENCOUNTER — Ambulatory Visit (INDEPENDENT_AMBULATORY_CARE_PROVIDER_SITE_OTHER)
Admission: RE | Admit: 2022-06-19 | Discharge: 2022-06-19 | Disposition: A | Discharge status: Home / Self Care | Payer: 59 | Source: Ambulatory Visit | Attending: Internal Medicine | Admitting: Internal Medicine

## 2022-06-19 ENCOUNTER — Ambulatory Visit (HOSPITAL_COMMUNITY)
Admission: RE | Admit: 2022-06-19 | Discharge: 2022-06-19 | Disposition: A | Discharge status: Home / Self Care | Payer: 59 | Source: Ambulatory Visit | Attending: Internal Medicine | Admitting: Internal Medicine

## 2022-06-19 VITALS — BP 138/82 | HR 93 | Wt 225.0 lb

## 2022-06-19 DIAGNOSIS — I5042 Chronic combined systolic (congestive) and diastolic (congestive) heart failure: Secondary | ICD-10-CM | POA: Diagnosis not present

## 2022-06-19 DIAGNOSIS — R42 Dizziness and giddiness: Secondary | ICD-10-CM

## 2022-06-19 DIAGNOSIS — I493 Ventricular premature depolarization: Secondary | ICD-10-CM | POA: Diagnosis not present

## 2022-06-19 DIAGNOSIS — R9431 Abnormal electrocardiogram [ECG] [EKG]: Secondary | ICD-10-CM | POA: Diagnosis not present

## 2022-06-19 DIAGNOSIS — R519 Headache, unspecified: Secondary | ICD-10-CM

## 2022-06-19 DIAGNOSIS — R55 Syncope and collapse: Secondary | ICD-10-CM | POA: Diagnosis not present

## 2022-06-19 LAB — URIC ACID: Uric Acid, Serum: 7.1 mg/dL (ref 3.7–8.6)

## 2022-06-19 LAB — CBC
HCT: 43.8 % (ref 39.0–52.0)
Hemoglobin: 14.9 g/dL (ref 13.0–17.0)
MCH: 33.6 pg (ref 26.0–34.0)
MCHC: 34 g/dL (ref 30.0–36.0)
MCV: 98.6 fL (ref 80.0–100.0)
Platelets: 200 10*3/uL (ref 150–400)
RBC: 4.44 MIL/uL (ref 4.22–5.81)
RDW: 12.8 % (ref 11.5–15.5)
WBC: 9.3 10*3/uL (ref 4.0–10.5)
nRBC: 0 % (ref 0.0–0.2)

## 2022-06-19 LAB — BASIC METABOLIC PANEL
Anion gap: 10 (ref 5–15)
BUN: 17 mg/dL (ref 8–23)
CO2: 28 mmol/L (ref 22–32)
Calcium: 9 mg/dL (ref 8.9–10.3)
Chloride: 103 mmol/L (ref 98–111)
Creatinine, Ser: 1.23 mg/dL (ref 0.61–1.24)
GFR, Estimated: >60 mL/min (ref >60)
Glucose, Bld: 174 mg/dL — ABNORMAL HIGH (ref 70–99)
Potassium: 3.8 mmol/L (ref 3.5–5.1)
Sodium: 141 mmol/L (ref 135–145)

## 2022-06-19 LAB — BRAIN NATRIURETIC PEPTIDE: B Natriuretic Peptide: 159.1 pg/mL — ABNORMAL HIGH (ref 0.0–100.0)

## 2022-06-19 NOTE — Progress Notes (Signed)
Per Lorie [employer] device rep, pt is having very frequent PAC's, he is RV pacing 83% of time and 55% of time HR is 85-110s  Per Dr Haroldine Laws pt needs to see EP next week, pt aware and agreeable, message sent to EP to call pt for f/u appt

## 2022-06-19 NOTE — Addendum Note (Signed)
Addended by: Shela Nevin R on: 5/78/9784 09:19 AM   Modules accepted: Orders

## 2022-06-19 NOTE — Telephone Encounter (Signed)
Orders Placed This Encounter  Procedures   Basic metabolic panel    Standing Status:   Future    Standing Expiration Date:   06/20/2023    Order Specific Question:   Release to patient    Answer:   Immediate   B Nat Peptide    Standing Status:   Future    Standing Expiration Date:   06/20/2023    Order Specific Question:   Release to patient    Answer:   Immediate   CBC    Standing Status:   Future    Standing Expiration Date:   06/20/2023    Order Specific Question:   Release to patient    Answer:   Immediate

## 2022-06-19 NOTE — Progress Notes (Signed)
REDS 35%

## 2022-06-23 ENCOUNTER — Other Ambulatory Visit: Payer: Self-pay | Admitting: Emergency Medicine

## 2022-06-27 ENCOUNTER — Other Ambulatory Visit: Payer: Self-pay | Admitting: Emergency Medicine

## 2022-06-29 ENCOUNTER — Ambulatory Visit (INDEPENDENT_AMBULATORY_CARE_PROVIDER_SITE_OTHER): Payer: 59 | Admitting: Internal Medicine

## 2022-06-29 ENCOUNTER — Encounter: Payer: Self-pay | Admitting: Internal Medicine

## 2022-06-29 VITALS — BP 118/68 | HR 88 | Temp 98.3°F | Resp 16 | Ht 70.0 in | Wt 227.0 lb

## 2022-06-29 DIAGNOSIS — E118 Type 2 diabetes mellitus with unspecified complications: Secondary | ICD-10-CM

## 2022-06-29 DIAGNOSIS — Z86010 Personal history of colonic polyps: Secondary | ICD-10-CM

## 2022-06-29 DIAGNOSIS — Z125 Encounter for screening for malignant neoplasm of prostate: Secondary | ICD-10-CM | POA: Insufficient documentation

## 2022-06-29 DIAGNOSIS — J0100 Acute maxillary sinusitis, unspecified: Secondary | ICD-10-CM

## 2022-06-29 DIAGNOSIS — E291 Testicular hypofunction: Secondary | ICD-10-CM | POA: Diagnosis not present

## 2022-06-29 LAB — PSA: PSA: 3.24 ng/mL (ref 0.10–4.00)

## 2022-06-29 MED ORDER — TESTOSTERONE 20.25 MG/1.25GM (1.62%) TD GEL: 1.2500 g | Freq: Every day | TRANSDERMAL | 0 refills | Status: DC

## 2022-06-29 MED ORDER — LEVOFLOXACIN 500 MG PO TABS: 500.0000 mg | ORAL_TABLET | Freq: Every day | ORAL | 0 refills | Status: DC

## 2022-06-29 NOTE — Progress Notes (Signed)
Subjective:  Patient ID: Robert Church, male    DOB: 02/03/1956  Age: 66 y.o. MRN: [id number]  CC: Sinusitis and COPD   HPI Robert Church presents for f/up -   He continues to complain of nonproductive cough.  He also complains of sinus symptoms with thick yellow phlegm from his nose with left facial pain.  I received an CT scan showed an air-fluid level in his left maxillary sinus.  He is concerned about his testosterone level.  He complains of fatigue, low libido, but normal erectile function.  Outpatient Medications Prior to Visit  Medication Sig Dispense Refill   albuterol (PROVENTIL) (2.5 MG/3ML) 0.083% nebulizer solution Take 3 mLs (2.5 mg total) by nebulization every 4 (four) hours as needed for wheezing or shortness of breath. 75 mL 11   albuterol (VENTOLIN HFA) 108 (90 Base) MCG/ACT inhaler INHALE 2 PUFFS INTO THE LUNGS EVERY 6 HOURS AS NEEDED FOR WHEEZING 18 g 2   allopurinol (ZYLOPRIM) 300 MG tablet Take 1 tablet (300 mg total) by mouth daily. 90 tablet 0   ALPRAZolam (XANAX) 0.5 MG tablet Take 1 tablet (0.5 mg total) by mouth 2 (two) times daily as needed for anxiety. 180 tablet 0   AMBULATORY NON FORMULARY MEDICATION Medication Name: Nitroglycerin gel 0.125% apply 2-3 times daily to the anal canal. (Patient taking differently: Place 1 application  rectally in the morning, at noon, and at bedtime. Medication Name: Nitroglycerin gel 0.125% apply 2-3 times daily to the anal canal.) 30 g 1   aspirin EC 81 MG tablet Take 81 mg by mouth daily. Swallow whole.     cholecalciferol (VITAMIN D3) 25 MCG (1000 UNIT) tablet Take 1,000 Units by mouth daily.     colchicine 0.6 MG tablet Take 0.6 mg by mouth daily as needed.     EPINEPHrine 0.3 mg/0.3 mL IJ SOAJ injection Inject 0.3 mg into the muscle as needed for anaphylaxis.     fluticasone (FLONASE) 50 MCG/ACT nasal spray SHAKE LIQUID AND USE 2 SPRAYS IN EACH NOSTRIL DAILY 16 g 2   Glucosamine HCl (GLUCOSAMINE PO) Take 1 tablet by mouth  daily.     guaiFENesin (MUCINEX) 600 MG 12 hr tablet Take 1 tablet (600 mg total) by mouth 2 (two) times daily. 60 tablet 5   ipratropium (ATROVENT) 0.06 % nasal spray Place 2 sprays into both nostrils 4 (four) times daily. 15 mL 3   Lidocaine, Anorectal, 5 % CREA Apply 1 application topically 2 (two) times daily. 30 g 1   mometasone-formoterol (DULERA) 100-5 MCG/ACT AERO Inhale 2 puffs into the lungs 2 (two) times daily.     montelukast (SINGULAIR) 10 MG tablet TAKE 1 TABLET(10 MG) BY MOUTH AT BEDTIME 30 tablet 5   nicotine (NICODERM CQ - DOSED IN MG/24 HOURS) 14 mg/24hr patch Place 1 patch (14 mg total) onto the skin daily. 28 patch 0   omeprazole (PRILOSEC) 20 MG capsule Take 1 capsule (20 mg total) by mouth 2 (two) times daily before a meal. ACID REFLUX 180 capsule 1   polyethylene glycol (MIRALAX / GLYCOLAX) 17 g packet Take 17 g by mouth daily as needed. 14 each 0   potassium chloride SA (KLOR-CON M20) 20 MEQ tablet Take 1 tablet (20 mEq total) by mouth daily. 90 tablet 3   predniSONE (DELTASONE) 10 MG tablet TAKE 1 TABLET(10 MG) BY MOUTH DAILY WITH BREAKFAST 30 tablet 5   Roflumilast (DALIRESP) 250 MCG TABS Take 1 tablet by mouth daily. 30 tablet 11  Tiotropium Bromide Monohydrate (SPIRIVA RESPIMAT) 2.5 MCG/ACT AERS Inhale 2 puffs into the lungs daily. 4 g 4   torsemide (DEMADEX) 20 MG tablet Take 20 mg by mouth daily.     traMADol (ULTRAM) 50 MG tablet Take 1 tablet (50 mg total) by mouth every 8 (eight) hours as needed. 90 tablet 2   vitamin B-12 (CYANOCOBALAMIN) 1000 MCG tablet Take 1,000 mcg by mouth daily.     No facility-administered medications prior to visit.    ROS Review of Systems  Constitutional:  Positive for fatigue. Negative for chills, diaphoresis and fever.  HENT:  Positive for postnasal drip, sinus pressure and sinus pain. Negative for nosebleeds, sore throat and trouble swallowing.   Eyes: Negative.   Respiratory:  Positive for cough. Negative for chest tightness,  shortness of breath and wheezing.   Cardiovascular:  Negative for chest pain, palpitations and leg swelling.  Gastrointestinal:  Negative for abdominal pain, constipation, diarrhea, nausea and vomiting.  Endocrine: Negative.   Genitourinary: Negative.  Negative for difficulty urinating and dysuria.  Musculoskeletal: Negative.  Negative for myalgias.  Skin: Negative.  Negative for color change.  Neurological: Negative.  Negative for dizziness, weakness and light-headedness.  Hematological:  Negative for adenopathy. Does not bruise/bleed easily.  Psychiatric/Behavioral: Negative.      Objective:  BP 118/68 (BP Location: Right Arm, Patient Position: Sitting, Cuff Size: Large)   Pulse 88   Temp 98.3 F (36.8 C) (Oral)   Resp 16   Ht '5\' 10"'$  (1.778 m)   Wt 227 lb (103 kg)   SpO2 92%   BMI 32.57 kg/m   BP Readings from Last 3 Encounters:  06/29/22 118/68  06/19/22 138/82  06/05/22 132/76    Wt Readings from Last 3 Encounters:  06/29/22 227 lb (103 kg)  06/19/22 225 lb (102.1 kg)  06/05/22 221 lb 12.8 oz (100.6 kg)    Physical Exam Vitals reviewed.  HENT:     Nose: Nose normal.     Mouth/Throat:     Mouth: Mucous membranes are moist.  Eyes:     General: No scleral icterus.    Conjunctiva/sclera: Conjunctivae normal.  Cardiovascular:     Rate and Rhythm: Normal rate and regular rhythm.     Heart sounds: No murmur heard. Pulmonary:     Effort: Pulmonary effort is normal.     Breath sounds: No stridor. No wheezing, rhonchi or rales.  Abdominal:     General: Abdomen is protuberant.     Palpations: There is no mass.     Tenderness: There is no abdominal tenderness. There is no guarding.     Hernia: No hernia is present.  Musculoskeletal:        General: Normal range of motion.     Cervical back: Neck supple.     Right lower leg: No edema.     Left lower leg: No edema.  Lymphadenopathy:     Cervical: No cervical adenopathy.  Skin:    General: Skin is warm and dry.   Neurological:     General: No focal deficit present.     Mental Status: He is alert.  Psychiatric:        Mood and Affect: Mood normal.        Behavior: Behavior normal.     Lab Results  Component Value Date   WBC 9.3 06/19/2022   HGB 14.9 06/19/2022   HCT 43.8 06/19/2022   PLT 200 06/19/2022   GLUCOSE 174 (H) 06/19/2022   CHOL  191 10/20/2021   TRIG 110.0 10/20/2021   HDL 52.70 10/20/2021   LDLDIRECT 150.0 03/25/2021   LDLCALC 117 (H) 10/20/2021   ALT 19 06/05/2022   AST 16 06/05/2022   NA 141 06/19/2022   K 3.8 06/19/2022   CL 103 06/19/2022   CREATININE 1.23 06/19/2022   BUN 17 06/19/2022   CO2 28 06/19/2022   TSH 1.147 04/16/2021   PSA 3.24 06/29/2022   INR 0.9 01/29/2022   HGBA1C 6.4 06/05/2022    CT HEAD WO CONTRAST (5MM)  Result Date: 06/19/2022 CLINICAL DATA:  Dizziness, persistent/recurrent, cardiac or vascular cause suspected Headache, new or worsening (Age >= 50y) Syncope/presyncope, cerebrovascular cause suspected EXAM: CT HEAD WITHOUT CONTRAST TECHNIQUE: Contiguous axial images were obtained from the base of the skull through the vertex without intravenous contrast. RADIATION DOSE REDUCTION: This exam was performed according to the departmental dose-optimization program which includes automated exposure control, adjustment of the mA and/or kV according to patient size and/or use of iterative reconstruction technique. COMPARISON:  01/12/2019 FINDINGS: Brain: No evidence of acute infarction, hemorrhage, hydrocephalus, extra-axial collection or mass lesion/mass effect. Vascular: No hyperdense vessel or unexpected calcification. Skull: Normal. Negative for fracture or focal lesion. Sinuses/Orbits: Debris in the left maxillary sinus with layering air-fluid level. Paranasal sinuses and mastoid air cells are otherwise clear. Other: None. IMPRESSION: 1. No acute intracranial abnormality. 2. Debris in the left maxillary sinus with air-fluid level. Correlate for acute  sinusitis. Electronically Signed   By: Davina Poke D.O.   On: 06/19/2022 15:21   XR KNEE 3 VIEW LEFT  Result Date: 06/30/2022 Films of the left knee were obtained in 3 projections standing and compared to the right knee.  Findings are fairly similar with the last narrowing of the medial joint space but otherwise consistent with moderate osteoarthritis.  No acute changes.  No ectopic calcification  XR KNEE 3 VIEW RIGHT  Result Date: 06/30/2022 Films of the right knee obtained in 3 projections standing.  There is narrowing of the medial joint space associated with subchondral sclerosis and peripheral osteophytes.  There is some osteophyte formation on the lateral compartment as well.  Patella tracks laterally.  Films are consistent with moderate osteoarthritis  CT HEAD WO CONTRAST (5MM)  Result Date: 06/19/2022 CLINICAL DATA:  Dizziness, persistent/recurrent, cardiac or vascular cause suspected Headache, new or worsening (Age >= 50y) Syncope/presyncope, cerebrovascular cause suspected EXAM: CT HEAD WITHOUT CONTRAST TECHNIQUE: Contiguous axial images were obtained from the base of the skull through the vertex without intravenous contrast. RADIATION DOSE REDUCTION: This exam was performed according to the departmental dose-optimization program which includes automated exposure control, adjustment of the mA and/or kV according to patient size and/or use of iterative reconstruction technique. COMPARISON:  01/12/2019 FINDINGS: Brain: No evidence of acute infarction, hemorrhage, hydrocephalus, extra-axial collection or mass lesion/mass effect. Vascular: No hyperdense vessel or unexpected calcification. Skull: Normal. Negative for fracture or focal lesion. Sinuses/Orbits: Debris in the left maxillary sinus with layering air-fluid level. Paranasal sinuses and mastoid air cells are otherwise clear. Other: None. IMPRESSION: 1. No acute intracranial abnormality. 2. Debris in the left maxillary sinus with  air-fluid level. Correlate for acute sinusitis. Electronically Signed   By: Davina Poke D.O.   On: 06/19/2022 15:21   DG Lumbar Spine Complete  Result Date: 06/08/2022 CLINICAL DATA:  Low back pain status post fall. EXAM: LUMBAR SPINE - COMPLETE 4+ VIEW COMPARISON:  None Available. FINDINGS: No fracture. Mild degenerative retrolisthesis L2 on L3 noted. Loss of disc space  height seen at L4-5. Lower lumbar facet degenerative disease noted. Paraspinous structures demonstrate aortic atherosclerosis. IMPRESSION: No acute abnormality. Mild appearing lumbar spondylosis. Aortic Atherosclerosis (ICD10-I70.0). Electronically Signed   By: Inge Rise M.D.   On: 06/08/2022 09:46   DG Cervical Spine Complete  Result Date: 06/08/2022 CLINICAL DATA:  Neck pain.  Status post fall. EXAM: CERVICAL SPINE - COMPLETE 4+ VIEW COMPARISON:  Plain film cervical spine 03/03/2022. FINDINGS: No fracture or malalignment. Straightening of the normal cervical lordosis is noted. Loss of disc space height is seen at C5-6 and C6-7. There is multilevel facet degenerative change. Prevertebral soft tissues appear normal. Lung apices are clear. Lead for presumed stimulator in the right side of the neck is unchanged. IMPRESSION: No acute abnormality. Cervical degenerative change appearing worst at C5-6 and C6-7. Electronically Signed   By: Inge Rise M.D.   On: 06/08/2022 09:45   DG Chest 2 View  Result Date: 06/06/2022 CLINICAL DATA:  66 year old male with syncope EXAM: CHEST - 2 VIEW COMPARISON:  05/21/2022 FINDINGS: Cardiomediastinal silhouette unchanged in size and contour. No evidence of central vascular congestion. No interlobular septal thickening. Unchanged appearance of right chest wall generator with the leads terminating in the neck region. Unchanged left chest wall pacing device with 2 leads in place. No pneumothorax or pleural effusion. Coarsened interstitial markings, with no confluent airspace disease. No acute  displaced fracture. Degenerative changes of the spine. IMPRESSION: Negative for acute cardiopulmonary disease Electronically Signed   By: Corrie Mckusick D.O.   On: 06/06/2022 11:24   CUP PACEART INCLINIC DEVICE CHECK  Result Date: 05/28/2022 Pacemaker check in clinic. Normal device function. Thresholds, sensing, impedances consistent with previous measurements. Device programmed to maximize longevity. No mode switch or high ventricular rates noted. Device programmed at appropriate safety margins. Histogram distribution appropriate for patient activity level. Device programmed to optimize intrinsic conduction. Estimated longevity 8 yr, 3 mo. Patient enrolled in remote follow-up. Patient education completed.  DG Chest 2 View  Result Date: 05/21/2022 CLINICAL DATA:  Congestion, cough EXAM: CHEST - 2 VIEW COMPARISON:  Chest radiograph 04/08/2022 FINDINGS: A left chest wall cardiac device with 2 leads and right chest wall neurostimulator device with a lead coursing into the right neck are again seen. The cardiomediastinal silhouette is unchanged, with unchanged mild cardiomegaly. The lungs are hyperinflated, with flattening of the diaphragms suggesting underlying COPD. There is new patchy opacity in the lateral left lung base with silhouetting of the diaphragm. There is no other focal airspace disease. There is no pleural effusion or pneumothorax There is no acute osseous abnormality. IMPRESSION: 1. New patchy opacities in the lateral left lower lung with silhouetting of the diaphragms suspicious for pneumonia in the correct clinical setting. 2. Unchanged hyperinflated lungs suggesting underlying COPD and mild cardiomegaly. Electronically Signed   By: Valetta Mole M.D.   On: 05/21/2022 16:47   XR C-ARM NO REPORT  Result Date: 05/05/2022 Please see Notes tab for imaging impression.  Epidural Steroid injection  Result Date: 05/05/2022 Magnus Sinning, MD     05/20/2022  5:23 AM Cervical Epidural Steroid  Injection - Interlaminar Approach with Fluoroscopic Guidance Patient: Robert Church     Date of Birth: 1956/10/11 MRN: [id number] PCP: Janith Lima, MD     Visit Date: 05/05/2022  Universal Protocol:   Date/Time: 05/31/235:23 AM Consent Given By: the patient Position: PRONE Additional Comments: Vital signs were monitored before and after the procedure. Patient was prepped and draped in the usual sterile fashion.  The correct patient, procedure, and site was verified. Injection Procedure Details: Procedure diagnoses: Lumbar radiculopathy [M54.16]  Meds Administered: Meds ordered this encounter Medications  methylPREDNISolone acetate (DEPO-MEDROL) injection 80 mg  Laterality: Left Location/Site: C7-T1 Needle: 3.5 in., 20 ga. Tuohy Needle Placement: Paramedian epidural space Findings:  -Comments: Excellent flow of contrast into the epidural space. Procedure Details: Using a paramedian approach from the side mentioned above, the region overlying the inferior lamina was localized under fluoroscopic visualization and the soft tissues overlying this structure were infiltrated with 4 ml. of 1% Lidocaine without Epinephrine. A # 20 gauge, Tuohy needle was inserted into the epidural space using a paramedian approach. The epidural space was localized using loss of resistance along with contralateral oblique bi-planar fluoroscopic views.  After negative aspirate for air, blood, and CSF, a 2 ml. volume of Isovue-250 was injected into the epidural space and the flow of contrast was observed. Radiographs were obtained for documentation purposes. The injectate was administered into the level noted above. Additional Comments: The patient tolerated the procedure well Dressing: 2 x 2 sterile gauze and Band-Aid  Post-procedure details: Patient was observed during the procedure. Post-procedure instructions were reviewed. Patient left the clinic in stable condition.  DG Chest 2 View  Result Date: 04/10/2022 CLINICAL DATA:  Left-sided  chest wall pain.  Fall 2 weeks ago. EXAM: CHEST - 2 VIEW COMPARISON:  02/10/2022 FINDINGS: Stable mild cardiomegaly. Pacemaker remains in appropriate position. Neurostimulator device is also seen in the right hemithorax. Aortic atherosclerotic calcification noted. Both lungs are clear.  No evidence of pleural effusion. IMPRESSION: Stable mild cardiomegaly. No active lung disease. Electronically Signed   By: Marlaine Hind M.D.   On: 04/10/2022 10:14   CUP PACEART REMOTE DEVICE CHECK  Result Date: 04/08/2022 Scheduled remote reviewed. Normal device function.  25 PMT, likely AT, histogram peak 80-100 Next remote 91 days. LA    Assessment & Plan:   Jd was seen today for sinusitis and copd.  Diagnoses and all orders for this visit:  Type II diabetes mellitus with manifestations ([place])- His blood sugar is adequately well controlled. -     HM Diabetes Foot Exam  Subacute maxillary sinusitis- Will treat with levofloxacin. -     levofloxacin (LEVAQUIN) 500 MG tablet; Take 1 tablet (500 mg total) by mouth daily for 7 days.  Hypogonadism in male -     Testosterone 20.25 MG/1.25GM (1.62%) GEL; Place 1.25 g onto the skin daily.  Prostate cancer screening -     PSA; Future -     PSA   I am having Robert Church start on levofloxacin and Testosterone. I am also having him maintain his polyethylene glycol, EPINEPHrine, aspirin EC, albuterol, Glucosamine HCl (GLUCOSAMINE PO), potassium chloride SA, ipratropium, guaiFENesin, Lidocaine (Anorectal), AMBULATORY NON FORMULARY MEDICATION, cholecalciferol, vitamin B-12, mometasone-formoterol, allopurinol, Spiriva Respimat, traMADol, colchicine, Roflumilast, omeprazole, ALPRAZolam, nicotine, predniSONE, montelukast, torsemide, albuterol, and fluticasone.  Meds ordered this encounter  Medications   levofloxacin (LEVAQUIN) 500 MG tablet    Sig: Take 1 tablet (500 mg total) by mouth daily for 7 days.    Dispense:  7 tablet    Refill:  0   Testosterone 20.25  MG/1.25GM (1.62%) GEL    Sig: Place 1.25 g onto the skin daily.    Dispense:  112.5 g    Refill:  0     Follow-up: Return in about 3 months (around 09/29/2022).  Scarlette Calico, MD

## 2022-06-29 NOTE — Patient Instructions (Signed)

## 2022-06-30 ENCOUNTER — Ambulatory Visit (INDEPENDENT_AMBULATORY_CARE_PROVIDER_SITE_OTHER): Payer: 59 | Admitting: Orthopaedic Surgery

## 2022-06-30 ENCOUNTER — Telehealth: Payer: Self-pay | Admitting: *Deleted

## 2022-06-30 ENCOUNTER — Ambulatory Visit: Payer: Self-pay

## 2022-06-30 ENCOUNTER — Encounter: Payer: Self-pay | Admitting: Orthopaedic Surgery

## 2022-06-30 ENCOUNTER — Ambulatory Visit (INDEPENDENT_AMBULATORY_CARE_PROVIDER_SITE_OTHER): Payer: 59

## 2022-06-30 DIAGNOSIS — M25562 Pain in left knee: Secondary | ICD-10-CM

## 2022-06-30 DIAGNOSIS — M170 Bilateral primary osteoarthritis of knee: Secondary | ICD-10-CM | POA: Diagnosis not present

## 2022-06-30 DIAGNOSIS — G8929 Other chronic pain: Secondary | ICD-10-CM

## 2022-06-30 DIAGNOSIS — M25561 Pain in right knee: Secondary | ICD-10-CM | POA: Diagnosis not present

## 2022-06-30 MED ORDER — METHYLPREDNISOLONE ACETATE 40 MG/ML IJ SUSP
80.0000 mg | INTRAMUSCULAR | Status: AC | PRN
  Administered 2022-06-30: 80 mg via INTRA_ARTICULAR

## 2022-06-30 MED ORDER — LIDOCAINE HCL 1 % IJ SOLN
2.0000 mL | INTRAMUSCULAR | Status: AC | PRN
  Administered 2022-06-30: 2 mL

## 2022-06-30 MED ORDER — BUPIVACAINE HCL 0.25 % IJ SOLN
2.0000 mL | INTRAMUSCULAR | Status: AC | PRN
  Administered 2022-06-30: 2 mL via INTRA_ARTICULAR

## 2022-06-30 NOTE — Telephone Encounter (Signed)
Pt was on cover-my-meds need PA on Testosterone. Submitted PA w/ (Key: [id number]). Rec'd msg PA sent to [employer].Marland KitchenJohny Chess

## 2022-06-30 NOTE — Progress Notes (Signed)
Office Visit Note   Patient: Robert Church           Date of Birth: 1956/07/06           MRN: [id number] Visit Date: 06/30/2022              Requested by: Janith Lima, MD [address] [place],  [place] [address] PCP: Janith Lima, MD   Assessment & Plan: Visit Diagnoses:  1. Chronic pain of both knees   2. Bilateral primary osteoarthritis of knee     Plan: Robert Church has evidence of osteoarthritis in both knees with positive effusion.  I aspirated both knees and injected cortisone.  Long discussion regarding his diagnosis and what he may expect over time.  We will plan to see back as needed  Follow-Up Instructions: Return if symptoms worsen or fail to improve.   Orders:  Orders Placed This Encounter  Procedures   XR KNEE 3 VIEW LEFT   XR KNEE 3 VIEW RIGHT   No orders of the defined types were placed in this encounter.     Procedures: Large Joint Inj: bilateral knee on 06/30/2022 5:18 PM Indications: diagnostic evaluation Details: 25 G 1.5 in needle, anteromedial approach  Arthrogram: No  Medications (Right): 2 mL lidocaine 1 %; 80 mg methylPREDNISolone acetate 40 MG/ML; 2 mL bupivacaine 0.25 % Aspirate (Right): 25 mL clear Medications (Left): 2 mL lidocaine 1 %; 80 mg methylPREDNISolone acetate 40 MG/ML; 2 mL bupivacaine 0.25 % Aspirate (Left): 90 mL clear Outcome: tolerated well, no immediate complications Consent was given by the patient. Immediately prior to procedure a time out was called to verify the correct patient, procedure, equipment, support staff and site/side marked as required. Patient was prepped and draped in the usual sterile fashion.       Clinical Data: No additional findings.   Subjective: Chief Complaint  Patient presents with   Right Knee - Pain   Left Knee - Pain  Long history of bilateral knee pain with recurrent "swelling".  No history of injury or trauma.  HPI  Review of Systems   Objective: Vital Signs: There were no  vitals taken for this visit.  Physical Exam Constitutional:      Appearance: He is well-developed.  Pulmonary:     Effort: Pulmonary effort is normal.  Skin:    General: Skin is warm and dry.  Neurological:     Mental Status: He is alert and oriented to person, place, and time.  Psychiatric:        Behavior: Behavior normal.     Ortho Exam awake alert and oriented x3.  Comfortable sitting.  Has bilateral knee effusions.  More on the left than the right knee.  No instability.  Full extension actively and flex to over 100 degrees without instability.  Some patella pain and crepitation bilaterally.  More medial than lateral joint pain both knees.  Skin intact.  No popliteal pain or mass.  No calf pain.  Has some altered sensibility in his feet.  Remote history of "neuropathy" patient is not diabetic.  +1 pulses.  Feet were warm.  Painless range of motion both hips.  Straight leg raise negative  Specialty Comments:  CT of Cervical Spine IMPRESSION: 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 Bilateral foraminal stenosis additionally at this level that could affect either C6 nerve.   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  No apparent compressive stenosis at this level.   Chronic facet fusion on the right at C3-4. Right foraminal narrowing that could affect the right C4 nerve.   C4-5: Disc bulge and facet osteoarthritis worse on the right. Mild right foraminal stenosis.   C6-7: Bilateral foraminal narrowing of a moderate degree that could possibly be symptomatic.     Electronically Signed By: Nelson Chimes M.D. On: 06/03/2021 13:46 --------------------------------- CT of Lumbar Spine IMPRESSION: No apparent compressive central canal stenosis.   Mild non-compressive disc bulges L2-3 and L3-4.   Facet osteoarthritis at L4-5 left worse than right. Left foraminal narrowing due to encroachment by  bulging disc material could possibly affect the left L4 nerve.   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 No apparent compressive stenosis of the canal or lateral recesses. Mild to moderate foraminal narrowing on the right could possibly affect the exiting L5 nerve.     Electronically Signed   By: Nelson Chimes M.D.   On: 06/03/2021 13:50  Imaging: XR KNEE 3 VIEW LEFT  Result Date: 06/30/2022 Films of the left knee were obtained in 3 projections standing and compared to the right knee.  Findings are fairly similar with the last narrowing of the medial joint space but otherwise consistent with moderate osteoarthritis.  No acute changes.  No ectopic calcification  XR KNEE 3 VIEW RIGHT  Result Date: 06/30/2022 Films of the right knee obtained in 3 projections standing.  There is narrowing of the medial joint space associated with subchondral sclerosis and peripheral osteophytes.  There is some osteophyte formation on the lateral compartment as well.  Patella tracks laterally.  Films are consistent with moderate osteoarthritis    PMFS History: Patient Active Problem List   Diagnosis Date Noted   Bilateral primary osteoarthritis of knee 06/30/2022   Subacute maxillary sinusitis 06/29/2022   Prostate cancer screening 06/29/2022   Hypogonadism in male 06/07/2022   History of gout 06/07/2022   Fall 06/07/2022   Neck pain 06/07/2022   Acute midline low back pain without sciatica 06/07/2022   Chronic respiratory failure with hypoxia ([place]) 11/24/2021   Encounter for general adult medical examination with abnormal findings 08/20/2021   GAD (generalized anxiety disorder) 07/04/2021   Degenerative disc disease, cervical 04/21/2021   AV block, 3rd degree ([place]) 04/16/2021   Carotid artery calcification 04/09/2021   Panlobular emphysema ([place]) 03/25/2021   Atherosclerosis of aorta ([place]) 03/25/2021   Body mass index  (BMI) 32.0-32.9, adult 01/15/2021   Lumbar spondylosis 01/10/2021   Foraminal stenosis of cervical region 01/10/2021   Chronic bilateral low back pain without sciatica 01/09/2021   Type II diabetes mellitus with manifestations ([place]) 11/29/2020   Primary osteoarthritis of both knees 09/24/2020   Idiopathic chronic gout of foot without tophus 08/08/2020   Yellow jacket sting allergy 08/08/2020   Class 2 severe obesity due to excess calories with serious comorbidity and body mass index (BMI) of 38.0 to 38.9 in adult ([place]) 08/05/2020   Hyperlipidemia LDL goal <70 07/11/2020   Hypertriglyceridemia 07/08/2020   Routine general medical examination at a health care facility 37/85/8850   Umbilical hernia without obstruction and without gangrene 07/08/2020   Chronic combined systolic and diastolic CHF (congestive heart failure) ([place]) 01/09/2020   Solitary pulmonary nodule 08/15/2019   Anal fissure 06/07/2019   NICM (nonischemic cardiomyopathy) ([place]) 12/16/2018   History of adenomatous polyp of colon 11/25/2018   Cigarette smoker 01/16/2015   Obstructive sleep apnea syndrome  01/05/2013   Asbestos exposure 01/05/2013   Seasonal allergic rhinitis 10/06/2012   GERD (gastroesophageal reflux disease) 06/05/2012   Intrinsic asthma 07/23/2011   HTN (hypertension) 07/23/2011   Past Medical History:  Diagnosis Date   Adenomatous colon polyp    Allergy    Anal fissure    Arthritis    Asthma    Bronchitis    CHF (congestive heart failure) (HCC)    COPD, group D, by GOLD 2017 classification ([place])    Dyspnea    Emphysema lung (HCC)    GERD (gastroesophageal reflux disease)    History of hiatal hernia    Hyperlipidemia    Hypertension    NICM (nonischemic cardiomyopathy) (HCC)    OSA (obstructive sleep apnea)    Pneumonia    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   Requires supplemental oxygen    Sinusitis    SOB (shortness of breath)     Family History  Problem Relation Age of  Onset   Lung cancer Father    High blood pressure Mother    Diabetes Maternal Grandmother    Colon polyps Neg Hx    Esophageal cancer Neg Hx    Pancreatic cancer Neg Hx    Stomach cancer Neg Hx     Past Surgical History:  Procedure Laterality Date   BIV UPGRADE N/A 10/07/2021   Procedure: BIV PPM UPGRADE;  Surgeon: Constance Haw, MD;  Location: [facility];  Service: Cardiovascular;  Laterality: N/A;   LEFT HEART CATH AND CORONARY ANGIOGRAPHY N/A 04/19/2017   Procedure: Left Heart Cath and Coronary Angiography;  Surgeon: Belva Crome, MD;  Location: [facility];  Service: Cardiovascular;  Laterality: N/A;   PACEMAKER IMPLANT N/A 04/16/2021   Procedure: PACEMAKER IMPLANT;  Surgeon: Constance Haw, MD;  Location: [facility];  Service: Cardiovascular;  Laterality: N/A;   RIGHT/LEFT HEART CATH AND CORONARY ANGIOGRAPHY N/A 08/22/2021   Procedure: RIGHT/LEFT HEART CATH AND CORONARY ANGIOGRAPHY;  Surgeon: Jolaine Artist, MD;  Location: [facility];  Service: Cardiovascular;  Laterality: N/A;   TONSILLECTOMY     Social History   Occupational History   Occupation: disabled  Tobacco Use   Smoking status: Former    Packs/day: 2.00    Years: 46.00    Total pack years: 92.00    Types: Cigarettes    Quit date: 01/27/2022    Years since quitting: 0.4    Passive exposure: Past   Smokeless tobacco: Never   Tobacco comments:    Had 3 cigarettes in past 2 weeks ARJ 02/27/22  Vaping Use   Vaping Use: Never used  Substance and Sexual Activity   Alcohol use: Not Currently    Alcohol/week: 28.0 standard drinks of alcohol    Types: 28 Cans of beer per week   Drug use: No   Sexual activity: Yes    Partners: Female    Birth control/protection: None     Garald Balding, MD   Note - This record has been created using [employer].  Chart creation errors have been sought, but may not always  have been located. Such creation errors do not reflect  on  the standard of medical care.

## 2022-06-30 NOTE — Telephone Encounter (Signed)
Rec'd determination fax med was APPROVED. It states This request has received a Favorable outcome. Coverage Starts on: 06/30/2022 12:00 AM, Coverage Ends on: 07/01/2023 12:00 AM Faxed approval to pharmacy on file.Marland KitchenJohny Church

## 2022-06-30 NOTE — Progress Notes (Signed)
Electrophysiology Office Note Date: 06/30/2022  ID:  Robert Church, DOB 1956-08-03, MRN [id number]  PCP: Robert Lima, MD Primary Cardiologist: Robert Burow, MD Electrophysiologist: Robert Haw, MD   CC: Routine ICD follow-up  Robert Church is a 66 y.o. male seen today for Robert Meredith Leeds, MD for routine electrophysiology followup.  Pt underwent Barostim implantation 01/29/2022 (commercial)  His goals for device titration are to walk into a building without SOB. Get to the point where he can ride motorcycle again (strength wise). He has sold it.   At last visit, any attempts to titrate device above 6.4 ma resulted in stim, so this is where he has been left.   Pt called office 5/30 with complains of syncopal episodes.  Noted he had been on antibiotics with a URI and reported O2 in the 80s at times.  He had previously called IM office for same and was recommended to go to ER, but pt declined.   Today he presents today at request of Dr. Haroldine Church for frequent RV pacing. RV pacing > 80% with frequent PACs as well.   We had previously attempted to upgrade his PPM with a CS lead 09/2021 but were unable to find a suitable area. Left bundle was briefly attempted with poor capture results. In addition, St Jude/Abbot did not have a left bundle lead at that time. Pt feels near his baseline, with SOB with more than moderate exertion.   Device History: St. Jude Dual Chamber PPM implanted 03/2021 for CHB. Failed Upgrade attempt 10/07/2021 Barostim implantation 01/29/2022   Past Medical History:  Diagnosis Date   Adenomatous colon polyp    Allergy    Anal fissure    Arthritis    Asthma    Bronchitis    CHF (congestive heart failure) ([place])    COPD, group D, by GOLD 2017 classification ([place])    Dyspnea    Emphysema lung (HCC)    GERD (gastroesophageal reflux disease)    History of hiatal hernia    Hyperlipidemia    Hypertension    NICM (nonischemic cardiomyopathy) (HCC)     OSA (obstructive sleep apnea)    Pneumonia    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   Requires supplemental oxygen    Sinusitis    SOB (shortness of breath)    Past Surgical History:  Procedure Laterality Date   BIV UPGRADE N/A 10/07/2021   Procedure: BIV PPM UPGRADE;  Surgeon: Robert Haw, MD;  Location: [facility];  Service: Cardiovascular;  Laterality: N/A;   LEFT HEART CATH AND CORONARY ANGIOGRAPHY N/A 04/19/2017   Procedure: Left Heart Cath and Coronary Angiography;  Surgeon: Belva Crome, MD;  Location: [facility];  Service: Cardiovascular;  Laterality: N/A;   PACEMAKER IMPLANT N/A 04/16/2021   Procedure: PACEMAKER IMPLANT;  Surgeon: Robert Haw, MD;  Location: [facility];  Service: Cardiovascular;  Laterality: N/A;   RIGHT/LEFT HEART CATH AND CORONARY ANGIOGRAPHY N/A 08/22/2021   Procedure: RIGHT/LEFT HEART CATH AND CORONARY ANGIOGRAPHY;  Surgeon: Jolaine Artist, MD;  Location: [facility];  Service: Cardiovascular;  Laterality: N/A;   TONSILLECTOMY      Current Outpatient Medications  Medication Sig Dispense Refill   albuterol (PROVENTIL) (2.5 MG/3ML) 0.083% nebulizer solution Take 3 mLs (2.5 mg total) by nebulization every 4 (four) hours as needed for wheezing or shortness of breath. 75 mL 11   albuterol (VENTOLIN HFA) 108 (  90 Base) MCG/ACT inhaler INHALE 2 PUFFS INTO THE LUNGS EVERY 6 HOURS AS NEEDED FOR WHEEZING 18 g 2   allopurinol (ZYLOPRIM) 300 MG tablet Take 1 tablet (300 mg total) by mouth daily. 90 tablet 0   ALPRAZolam (XANAX) 0.5 MG tablet Take 1 tablet (0.5 mg total) by mouth 2 (two) times daily as needed for anxiety. 180 tablet 0   AMBULATORY NON FORMULARY MEDICATION Medication Name: Nitroglycerin gel 0.125% apply 2-3 times daily to the anal canal. (Patient taking differently: Place 1 application  rectally in the morning, at noon, and at bedtime. Medication Name: Nitroglycerin gel 0.125% apply 2-3 times  daily to the anal canal.) 30 g 1   aspirin EC 81 MG tablet Take 81 mg by mouth daily. Swallow whole.     cholecalciferol (VITAMIN D3) 25 MCG (1000 UNIT) tablet Take 1,000 Units by mouth daily.     colchicine 0.6 MG tablet Take 0.6 mg by mouth daily as needed.     EPINEPHrine 0.3 mg/0.3 mL IJ SOAJ injection Inject 0.3 mg into the muscle as needed for anaphylaxis.     fluticasone (FLONASE) 50 MCG/ACT nasal spray SHAKE LIQUID AND USE 2 SPRAYS IN EACH NOSTRIL DAILY 16 g 2   Glucosamine HCl (GLUCOSAMINE PO) Take 1 tablet by mouth daily.     guaiFENesin (MUCINEX) 600 MG 12 hr tablet Take 1 tablet (600 mg total) by mouth 2 (two) times daily. 60 tablet 5   ipratropium (ATROVENT) 0.06 % nasal spray Place 2 sprays into both nostrils 4 (four) times daily. 15 mL 3   levofloxacin (LEVAQUIN) 500 MG tablet Take 1 tablet (500 mg total) by mouth daily for 7 days. 7 tablet 0   Lidocaine, Anorectal, 5 % CREA Apply 1 application topically 2 (two) times daily. 30 g 1   mometasone-formoterol (DULERA) 100-5 MCG/ACT AERO Inhale 2 puffs into the lungs 2 (two) times daily.     montelukast (SINGULAIR) 10 MG tablet TAKE 1 TABLET(10 MG) BY MOUTH AT BEDTIME 30 tablet 5   nicotine (NICODERM CQ - DOSED IN MG/24 HOURS) 14 mg/24hr patch Place 1 patch (14 mg total) onto the skin daily. 28 patch 0   omeprazole (PRILOSEC) 20 MG capsule Take 1 capsule (20 mg total) by mouth 2 (two) times daily before a meal. ACID REFLUX 180 capsule 1   polyethylene glycol (MIRALAX / GLYCOLAX) 17 g packet Take 17 g by mouth daily as needed. 14 each 0   potassium chloride SA (KLOR-CON M20) 20 MEQ tablet Take 1 tablet (20 mEq total) by mouth daily. 90 tablet 3   predniSONE (DELTASONE) 10 MG tablet TAKE 1 TABLET(10 MG) BY MOUTH DAILY WITH BREAKFAST 30 tablet 5   Roflumilast (DALIRESP) 250 MCG TABS Take 1 tablet by mouth daily. 30 tablet 11   Testosterone 20.25 MG/1.25GM (1.62%) GEL Place 1.25 g onto the skin daily. 112.5 g 0   Tiotropium Bromide  Monohydrate (SPIRIVA RESPIMAT) 2.5 MCG/ACT AERS Inhale 2 puffs into the lungs daily. 4 g 4   torsemide (DEMADEX) 20 MG tablet Take 20 mg by mouth daily.     traMADol (ULTRAM) 50 MG tablet Take 1 tablet (50 mg total) by mouth every 8 (eight) hours as needed. 90 tablet 2   vitamin B-12 (CYANOCOBALAMIN) 1000 MCG tablet Take 1,000 mcg by mouth daily.     No current facility-administered medications for this visit.    Allergies:   Spironolactone, Daliresp [roflumilast], Jardiance [empagliflozin], Penicillins, Clindamycin, Doxycycline, E-mycin [erythromycin base], and Rosuvastatin  Social History: Social History   Socioeconomic History   Marital status: Single    Spouse name: Not on file   Number of children: 1   Years of education: 12   Highest education level: Not on file  Occupational History   Occupation: disabled  Tobacco Use   Smoking status: Former    Packs/day: 2.00    Years: 46.00    Total pack years: 92.00    Types: Cigarettes    Quit date: 01/27/2022    Years since quitting: 0.4    Passive exposure: Past   Smokeless tobacco: Never   Tobacco comments:    Had 3 cigarettes in past 2 weeks ARJ 02/27/22  Vaping Use   Vaping Use: Never used  Substance and Sexual Activity   Alcohol use: Not Currently    Alcohol/week: 28.0 standard drinks of alcohol    Types: 28 Cans of beer per week   Drug use: No   Sexual activity: Yes    Partners: Female    Birth control/protection: None  Other Topics Concern   Not on file  Social History Narrative   Right handed   Tea sometimes and coffee (1/2 caffeine)   Social Determinants of Health   Financial Resource Strain: Medium Risk (04/24/2021)   Overall Financial Resource Strain (CARDIA)    Difficulty of Paying Living Expenses: Somewhat hard  Food Insecurity: No Food Insecurity (04/16/2021)   Hunger Vital Sign    Worried About Running Out of Food in the Last Year: Never true    Ran Out of Food in the Last Year: Never true   Transportation Needs: No Transportation Needs (04/16/2021)   PRAPARE - Hydrologist (Medical): No    Lack of Transportation (Non-Medical): No  Physical Activity: Not on file  Stress: Not on file  Social Connections: Not on file  Intimate Partner Violence: Not on file    Family History: Family History  Problem Relation Age of Onset   Lung cancer Father    High blood pressure Mother    Diabetes Maternal Grandmother    Colon polyps Neg Hx    Esophageal cancer Neg Hx    Pancreatic cancer Neg Hx    Stomach cancer Neg Hx     Review of systems complete and found to be negative unless listed in HPI.    Physical Exam: There were no vitals filed for this visit.  General: Pleasant, NAD. No resp difficulty Psych: Normal affect. HEENT:  Normal, without mass or lesion.         Neck: Supple, no bruits or JVD. Carotids 2+. No lymphadenopathy/thyromegaly appreciated. Heart: PMI nondisplaced. RRR no s3, s4, or murmurs. Lungs:  Resp regular and unlabored, CTA. Abdomen: Soft, non-tender, non-distended, No HSM, BS + x 4.   Extremities: No clubbing, cyanosis or edema. DP/PT/Radials 2+ and equal bilaterally. Neuro: Alert and oriented X 3. Moves all extremities spontaneously.   PPM interrogation- Performed personally today, see PaceART.   Barostim interrogation: Not performed today.   EKG:  EKG is not ordered today.  Recent Labs: 02/16/2022: NT-Pro BNP 1,090 06/05/2022: ALT 19 06/19/2022: B Natriuretic Peptide 159.1; BUN 17; Creatinine, Ser 1.23; Hemoglobin 14.9; Platelets 200; Potassium 3.8; Sodium 141   Wt Readings from Last 3 Encounters:  06/29/22 227 lb (103 kg)  06/19/22 225 lb (102.1 kg)  06/05/22 221 lb 12.8 oz (100.6 kg)     Other studies Reviewed: Additional studies/ records that were reviewed today include: Previous EP and  vascular office notes.   Assessment and Plan:  1.  Chronic systolic dysfunction s/p St. Jude  Dual chamber PPM S/p Barostim  (commercial) 01/29/2022 Volume status OK Continue torsemide 40 mg BID.  NYHA IIIs ymptoms  Stable on an appropriate medical regimen BAT titrated down to 5.4 ms @ 65 ms due to recurrent lump and pain in left neck.   Failed CRT upgrade due to poor CS options. May be candidate for left bundle lead.  Encouraged to remain active.    2. ? Atrial fibrillation Previously monitor strips without clear AF Continue to monitor via device.   Discussed with Dr. Curt Bears last week and willing to attempt Left bundle lead with Abbots newer lead. Pt would prefer to hold off on any additional medicine changes for now, in addition, trying to suppress his PACs may also result in MORE V pacing.   Current medicines are reviewed at length with the patient today.  Disposition:  Follow up with Dr. Curt Bears as usual post upgrade (attempt)  Of note, pt insurance switches after 08/20/2022, and he has currently met his out of pocket deductible.   Robert Lefevre, PA-C  06/30/2022 9:58 AM  [facility] [address] [place] [address] [phone] (office) [phone] (fax)

## 2022-07-03 ENCOUNTER — Encounter: Payer: Self-pay | Admitting: Adult Health

## 2022-07-03 ENCOUNTER — Ambulatory Visit (INDEPENDENT_AMBULATORY_CARE_PROVIDER_SITE_OTHER): Payer: 59 | Admitting: Adult Health

## 2022-07-03 DIAGNOSIS — I5042 Chronic combined systolic (congestive) and diastolic (congestive) heart failure: Secondary | ICD-10-CM

## 2022-07-03 DIAGNOSIS — J431 Panlobular emphysema: Secondary | ICD-10-CM

## 2022-07-03 DIAGNOSIS — J9611 Chronic respiratory failure with hypoxia: Secondary | ICD-10-CM | POA: Diagnosis not present

## 2022-07-03 DIAGNOSIS — G4733 Obstructive sleep apnea (adult) (pediatric): Secondary | ICD-10-CM

## 2022-07-03 MED ORDER — PREDNISONE 20 MG PO TABS: 20.0000 mg | ORAL_TABLET | Freq: Every day | ORAL | 0 refills | Status: DC

## 2022-07-03 NOTE — Assessment & Plan Note (Signed)
Continue on oxygen to maintain O2 saturations greater than 88 to 90% Continue on nocturnal oxygen with trilogy vent

## 2022-07-03 NOTE — Progress Notes (Signed)
$'@Robert Church'B$  ID: Robert Church, male    DOB: 1956-05-24, 67 y.o.   MRN: [id number]  Chief Complaint  Robert Church presents with   Acute Visit    Referring provider: Janith Lima, MD  HPI: 66 year old male former smoker recently quit followed for severe COPD with chronic bronchitic phenotype and chronic respiratory failure on oxygen.  Has obstructive sleep apnea on nocturnal trilogy Medical history significant for systolic and diastolic congestive heart failure, status post PPM  TEST/EVENTS :  Low-dose CT screening CT March 14, 2021 showed lung RADS 2 benign appearance, mild emphysema, mild scarring at the left upper lobe, 4.7 mm right middle lobe nodule and a 5.8 mm right lower lobe nodule unchanged from 2019   Spirometry December 2016 showed moderate restriction with an FEV1 at 69%, ratio 75, FVC 72%, decreased mid flows  07/03/2022 Acute OV  Robert Church presents for an acute office visit.  Complains of ongoing cough for last 6 weeks. Was diagnosed with CAP early June , treated with Levaquin for 1 week. Chest xray showed LLL pnuemonia . Follow  up chest xray 6/16 showed clearance.  Complains of a minimally productive cough with clear mucus that can it comes and goes.  Denies any fever, discolored mucus, hemoptysis, chest pain, orthopnea. He remains on [place] and Spiriva.  He is on chronic steroids with prednisone 10 mg daily.  He also is on Daliresp daily. Says overall leg swelling has been doing very well. Robert Church says he does take a BC powder every day.  I just gust with him in detail regarding the dangers of this medication and to avoid if possible. Robert Church remains on oxygen 2 L as needed.  And uses 4 L of oxygen with his trilogy vent at nighttime. Robert Church does not currently take anything for cough.  Allergies  Allergen Reactions   Spironolactone Anaphylaxis   Daliresp [Roflumilast] Other (See Comments)    Dizzy, headache, leg pain per Robert Church. 02/25/22.    Jardiance [Empagliflozin] Nausea  Only and Other (See Comments)    Lightheadness Dizziness   Penicillins Swelling and Other (See Comments)    Childhood rxn--MD stated he "almost died" Has Robert Church had a PCN reaction causing immediate rash, facial/tongue/throat swelling, SOB or lightheadedness with hypotension:Yes Has Robert Church had a PCN reaction causing severe rash involving mucus membranes or skin necrosis:unsure Has Robert Church had a PCN reaction that required hospitalization:unsure Has Robert Church had a PCN reaction occurring within the last 10 years:No If all of the above answers are "NO", then may proceed with Cephalosporin use.     Clindamycin Other (See Comments)    Tongue swelling   Doxycycline Nausea And Vomiting    Severe stomach upset per Robert Church   E-Mycin [Erythromycin Base] Swelling   Rosuvastatin Other (See Comments)    Myalgias (intolerance)    Immunization History  Administered Date(s) Administered   Influenza,inj,Quad PF,6+ Mos 09/17/2021   PNEUMOCOCCAL CONJUGATE-20 08/20/2021   Tdap 12/03/2011    Past Medical History:  Diagnosis Date   Adenomatous colon polyp    Allergy    Anal fissure    Arthritis    Asthma    Bronchitis    CHF (congestive heart failure) (HCC)    COPD, group D, by GOLD 2017 classification ([place])    Dyspnea    Emphysema lung (HCC)    GERD (gastroesophageal reflux disease)    History of hiatal hernia    Hyperlipidemia    Hypertension    NICM (nonischemic cardiomyopathy) (HCC)    OSA (  obstructive sleep apnea)    Pneumonia    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   Requires supplemental oxygen    Sinusitis    SOB (shortness of breath)     Tobacco History: Social History   Tobacco Use  Smoking Status Former   Packs/day: 2.00   Years: 46.00   Total pack years: 92.00   Types: Cigarettes   Quit date: 01/27/2022   Years since quitting: 0.4   Passive exposure: Past  Smokeless Tobacco Never  Tobacco Comments   Had 3 cigarettes in past 2 weeks hfb 07/03/2022    Counseling given: Not Answered Tobacco comments: Had 3 cigarettes in past 2 weeks hfb 07/03/2022   Outpatient Medications Prior to Visit  Medication Sig Dispense Refill   albuterol (PROVENTIL) (2.5 MG/3ML) 0.083% nebulizer solution Take 3 mLs (2.5 mg total) by nebulization every 4 (four) hours as needed for wheezing or shortness of breath. 75 mL 11   albuterol (VENTOLIN HFA) 108 (90 Base) MCG/ACT inhaler INHALE 2 PUFFS INTO THE LUNGS EVERY 6 HOURS AS NEEDED FOR WHEEZING 18 g 2   allopurinol (ZYLOPRIM) 300 MG tablet Take 1 tablet (300 mg total) by mouth daily. 90 tablet 0   ALPRAZolam (XANAX) 0.5 MG tablet Take 1 tablet (0.5 mg total) by mouth 2 (two) times daily as needed for anxiety. 180 tablet 0   AMBULATORY NON FORMULARY MEDICATION Medication Name: Nitroglycerin gel 0.125% apply 2-3 times daily to the anal canal. (Robert Church taking differently: Place 1 application  rectally in the morning, at noon, and at bedtime. Medication Name: Nitroglycerin gel 0.125% apply 2-3 times daily to the anal canal.) 30 g 1   aspirin EC 81 MG tablet Take 81 mg by mouth daily. Swallow whole.     cholecalciferol (VITAMIN D3) 25 MCG (1000 UNIT) tablet Take 1,000 Units by mouth daily.     colchicine 0.6 MG tablet Take 0.6 mg by mouth daily as needed.     EPINEPHrine 0.3 mg/0.3 mL IJ SOAJ injection Inject 0.3 mg into the muscle as needed for anaphylaxis.     fluticasone (FLONASE) 50 MCG/ACT nasal spray SHAKE LIQUID AND USE 2 SPRAYS IN EACH NOSTRIL DAILY 16 g 2   Glucosamine HCl (GLUCOSAMINE PO) Take 1 tablet by mouth daily.     guaiFENesin (MUCINEX) 600 MG 12 hr tablet Take 1 tablet (600 mg total) by mouth 2 (two) times daily. 60 tablet 5   ipratropium (ATROVENT) 0.06 % nasal spray Place 2 sprays into both nostrils 4 (four) times daily. 15 mL 3   Lidocaine, Anorectal, 5 % CREA Apply 1 application topically 2 (two) times daily. 30 g 1   mometasone-formoterol (DULERA) 100-5 MCG/ACT AERO Inhale 2 puffs into the lungs 2  (two) times daily.     montelukast (SINGULAIR) 10 MG tablet TAKE 1 TABLET(10 MG) BY MOUTH AT BEDTIME 30 tablet 5   nicotine (NICODERM CQ - DOSED IN MG/24 HOURS) 14 mg/24hr patch Place 1 patch (14 mg total) onto the skin daily. 28 patch 0   omeprazole (PRILOSEC) 20 MG capsule Take 1 capsule (20 mg total) by mouth 2 (two) times daily before a meal. ACID REFLUX 180 capsule 1   polyethylene glycol (MIRALAX / GLYCOLAX) 17 g packet Take 17 g by mouth daily as needed. 14 each 0   potassium chloride SA (KLOR-CON M20) 20 MEQ tablet Take 1 tablet (20 mEq total) by mouth daily. 90 tablet 3   predniSONE (DELTASONE) 10 MG tablet TAKE  1 TABLET(10 MG) BY MOUTH DAILY WITH BREAKFAST 30 tablet 5   Roflumilast (DALIRESP) 250 MCG TABS Take 1 tablet by mouth daily. 30 tablet 11   Testosterone 20.25 MG/1.25GM (1.62%) GEL Place 1.25 g onto the skin daily. 112.5 g 0   Tiotropium Bromide Monohydrate (SPIRIVA RESPIMAT) 2.5 MCG/ACT AERS Inhale 2 puffs into the lungs daily. 4 g 4   torsemide (DEMADEX) 20 MG tablet Take 20 mg by mouth daily.     traMADol (ULTRAM) 50 MG tablet Take 1 tablet (50 mg total) by mouth every 8 (eight) hours as needed. 90 tablet 2   vitamin B-12 (CYANOCOBALAMIN) 1000 MCG tablet Take 1,000 mcg by mouth daily.     levofloxacin (LEVAQUIN) 500 MG tablet Take 1 tablet (500 mg total) by mouth daily for 7 days. (Robert Church not taking: Reported on 07/03/2022) 7 tablet 0   No facility-administered medications prior to visit.     Review of Systems:   Constitutional:   No  weight loss, night sweats,  Fevers, chills,  +fatigue, or  lassitude.  HEENT:   No headaches,  Difficulty swallowing,  Tooth/dental problems, or  Sore throat,                No sneezing, itching, ear ache,  +nasal congestion, post nasal drip,   CV:  No chest pain,  Orthopnea, PND, swelling in lower extremities, anasarca, dizziness, palpitations, syncope.   GI  No heartburn, indigestion, abdominal pain, nausea, vomiting, diarrhea,  change in bowel habits, loss of appetite, bloody stools.   Resp: .  No chest wall deformity  Skin: no rash or lesions.  GU: no dysuria, change in color of urine, no urgency or frequency.  No flank pain, no hematuria   MS:  No joint pain or swelling.  No decreased range of motion.  No back pain.    Physical Exam  BP 122/88 (BP Location: Left Arm, Robert Church Position: Sitting, Cuff Size: Normal)   Pulse 85   Temp 97.7 F (36.5 C) (Oral)   Ht '5\' 10"'$  (1.778 m)   Wt 226 lb (102.5 kg)   SpO2 95%   BMI 32.43 kg/m   GEN: A/Ox3; pleasant , NAD, well nourished    HEENT:  [place]/AT,  NOSE-clear, THROAT-clear, no lesions, no postnasal drip or exudate noted.   NECK:  Supple w/ fair ROM; no JVD; normal carotid impulses w/o bruits; no thyromegaly or nodules palpated; no lymphadenopathy.    RESP  Clear  P & A; w/o, wheezes/ rales/ or rhonchi. no accessory muscle use, no dullness to percussion  CARD:  RRR, no m/r/g, tr  peripheral edema, pulses intact, no cyanosis or clubbing.  GI:   Soft & nt; nml bowel sounds; no organomegaly or masses detected.   Musco: Warm bil, no deformities or joint swelling noted.   Neuro: alert, no focal deficits noted.    Skin: Warm, no lesions or rashes    Lab Results:  CBC    Component Value Date/Time   WBC 9.3 06/19/2022 1404   RBC 4.44 06/19/2022 1404   HGB 14.9 06/19/2022 1404   HGB 15.4 09/26/2021 1318   HCT 43.8 06/19/2022 1404   HCT 44.4 09/26/2021 1318   PLT 200 06/19/2022 1404   PLT 215 09/26/2021 1318   MCV 98.6 06/19/2022 1404   MCV 95 09/26/2021 1318   MCH 33.6 06/19/2022 1404   MCHC 34.0 06/19/2022 1404   RDW 12.8 06/19/2022 1404   RDW 12.6 09/26/2021 1318   LYMPHSABS 0.7  06/05/2022 1518   LYMPHSABS 0.7 09/26/2021 1318   MONOABS 0.4 06/05/2022 1518   EOSABS 0.0 06/05/2022 1518   EOSABS 0.0 09/26/2021 1318   BASOSABS 0.0 06/05/2022 1518   BASOSABS 0.0 09/26/2021 1318    BMET    Component Value Date/Time   NA 141 06/19/2022  1404   NA 144 02/16/2022 1220   K 3.8 06/19/2022 1404   CL 103 06/19/2022 1404   CO2 28 06/19/2022 1404   GLUCOSE 174 (H) 06/19/2022 1404   BUN 17 06/19/2022 1404   BUN 15 02/16/2022 1220   CREATININE 1.23 06/19/2022 1404   CREATININE 1.04 07/10/2020 1035   CALCIUM 9.0 06/19/2022 1404   GFRNONAA >60 06/19/2022 1404   GFRAA >60 02/14/2020 1454    BNP    Component Value Date/Time   BNP 159.1 (H) 06/19/2022 1402    ProBNP    Component Value Date/Time   PROBNP 1,090 (H) 02/16/2022 1220   PROBNP 156.0 (H) 04/09/2021 1452    Imaging: XR KNEE 3 VIEW LEFT  Result Date: 06/30/2022 Films of the left knee were obtained in 3 projections standing and compared to the right knee.  Findings are fairly similar with the last narrowing of the medial joint space but otherwise consistent with moderate osteoarthritis.  No acute changes.  No ectopic calcification  XR KNEE 3 VIEW RIGHT  Result Date: 06/30/2022 Films of the right knee obtained in 3 projections standing.  There is narrowing of the medial joint space associated with subchondral sclerosis and peripheral osteophytes.  There is some osteophyte formation on the lateral compartment as well.  Patella tracks laterally.  Films are consistent with moderate osteoarthritis  CT HEAD WO CONTRAST (5MM)  Result Date: 06/19/2022 CLINICAL DATA:  Dizziness, persistent/recurrent, cardiac or vascular cause suspected Headache, new or worsening (Age >= 50y) Syncope/presyncope, cerebrovascular cause suspected EXAM: CT HEAD WITHOUT CONTRAST TECHNIQUE: Contiguous axial images were obtained from the base of the skull through the vertex without intravenous contrast. RADIATION DOSE REDUCTION: This exam was performed according to the departmental dose-optimization program which includes automated exposure control, adjustment of the mA and/or kV according to Robert Church size and/or use of iterative reconstruction technique. COMPARISON:  01/12/2019 FINDINGS: Brain: No  evidence of acute infarction, hemorrhage, hydrocephalus, extra-axial collection or mass lesion/mass effect. Vascular: No hyperdense vessel or unexpected calcification. Skull: Normal. Negative for fracture or focal lesion. Sinuses/Orbits: Debris in the left maxillary sinus with layering air-fluid level. Paranasal sinuses and mastoid air cells are otherwise clear. Other: None. IMPRESSION: 1. No acute intracranial abnormality. 2. Debris in the left maxillary sinus with air-fluid level. Correlate for acute sinusitis. Electronically Signed   By: Davina Poke D.O.   On: 06/19/2022 15:21   DG Lumbar Spine Complete  Result Date: 06/08/2022 CLINICAL DATA:  Low back pain status post fall. EXAM: LUMBAR SPINE - COMPLETE 4+ VIEW COMPARISON:  None Available. FINDINGS: No fracture. Mild degenerative retrolisthesis L2 on L3 noted. Loss of disc space height seen at L4-5. Lower lumbar facet degenerative disease noted. Paraspinous structures demonstrate aortic atherosclerosis. IMPRESSION: No acute abnormality. Mild appearing lumbar spondylosis. Aortic Atherosclerosis (ICD10-I70.0). Electronically Signed   By: Inge Rise M.D.   On: 06/08/2022 09:46   DG Cervical Spine Complete  Result Date: 06/08/2022 CLINICAL DATA:  Neck pain.  Status post fall. EXAM: CERVICAL SPINE - COMPLETE 4+ VIEW COMPARISON:  Plain film cervical spine 03/03/2022. FINDINGS: No fracture or malalignment. Straightening of the normal cervical lordosis is noted. Loss of disc space height is  seen at C5-6 and C6-7. There is multilevel facet degenerative change. Prevertebral soft tissues appear normal. Lung apices are clear. Lead for presumed stimulator in the right side of the neck is unchanged. IMPRESSION: No acute abnormality. Cervical degenerative change appearing worst at C5-6 and C6-7. Electronically Signed   By: Inge Rise M.D.   On: 06/08/2022 09:45   DG Chest 2 View  Result Date: 06/06/2022 CLINICAL DATA:  66 year old male with syncope  EXAM: CHEST - 2 VIEW COMPARISON:  05/21/2022 FINDINGS: Cardiomediastinal silhouette unchanged in size and contour. No evidence of central vascular congestion. No interlobular septal thickening. Unchanged appearance of right chest wall generator with the leads terminating in the neck region. Unchanged left chest wall pacing device with 2 leads in place. No pneumothorax or pleural effusion. Coarsened interstitial markings, with no confluent airspace disease. No acute displaced fracture. Degenerative changes of the spine. IMPRESSION: Negative for acute cardiopulmonary disease Electronically Signed   By: Corrie Mckusick D.O.   On: 06/06/2022 11:24    bupivacaine (MARCAINE) 0.25 % (with pres) injection 2 mL     Date Action Dose Route User   06/30/2022 1718 Given 2 mL Intra-articular (Left Knee) Garald Balding, MD      bupivacaine (MARCAINE) 0.25 % (with pres) injection 2 mL     Date Action Dose Route User   06/30/2022 1718 Given 2 mL Intra-articular (Right Knee) Garald Balding, MD      lidocaine (XYLOCAINE) 1 % (with pres) injection 2 mL     Date Action Dose Route User   06/30/2022 1718 Given 2 mL Other (Left Knee) Garald Balding, MD      lidocaine (XYLOCAINE) 1 % (with pres) injection 2 mL     Date Action Dose Route User   06/30/2022 1718 Given 2 mL Other (Right Knee) Garald Balding, MD      methylPREDNISolone acetate (DEPO-MEDROL) injection 80 mg     Date Action Dose Route User   06/30/2022 1718 Given 80 mg Intra-articular (Left Knee) Garald Balding, MD      methylPREDNISolone acetate (DEPO-MEDROL) injection 80 mg     Date Action Dose Route User   06/30/2022 1718 Given 80 mg Intra-articular (Right Knee) Garald Balding, MD      methylPREDNISolone acetate (DEPO-MEDROL) injection 80 mg     Date Action Dose Route User   Discharged on 05/21/2022   Admitted on 05/21/2022   05/05/2022 1415 Given 80 mg Other Magnus Sinning, MD           No data to display           No results found for: "NITRICOXIDE"      Assessment & Plan:   Panlobular emphysema ([place]) Slow to resolve COPD flare after recent pneumonia.  With postinfectious cough. Recent chest x-ray showed clearance of pneumonia.  We will treat for cough control along with trigger prevention.  Short prednisone burst  Plan  Robert Church Instructions  Mucinex DM Twice daily  As needed  cough/congestion  Fluids and rest.  Tylenol As needed   Continue on Dulera 2 puffs Twice daily , rinse after use.  Spiriva daily  Continue Daliresp daily .  Increase Prednisone '20mg'$  daily for 1 week, then Prednisone  '10mg'$  daily.  Begin Delsym 2 tsp Twice daily  for cough As needed   Begin Tessalon Three times a day  for cough As needed   Continue on Claritin '10mg'$  daily.  Albuterol inhaler or neb As needed  Continue on Oxygen 2l/m as needed to keep Oxygen Levels >88-90%.  Continue on Trilogy vent /BIPAP At bedtime  with Oxygen 4l/m  Great job not smoking .  Follow up with [name] or Alessander Sikorski NP in 3 months w/  PFT and As needed   Please contact office for sooner follow up if symptoms do not improve or worsen or seek emergency care       Chronic respiratory failure with hypoxia ([place]) Continue on oxygen to maintain O2 saturations greater than 88 to 90% Continue on nocturnal oxygen with trilogy vent  Obstructive sleep apnea syndrome Continue on nocturnal trilogy vent with oxygen  Chronic combined systolic and diastolic CHF (congestive heart failure) (HCC) Appears euvolemic on exam.  No evidence of volume overload.  Continue current regimen and follow-up with cardiology     Rexene Edison, NP 07/03/2022

## 2022-07-03 NOTE — Assessment & Plan Note (Signed)
Appears euvolemic on exam.  No evidence of volume overload.  Continue current regimen and follow-up with cardiology

## 2022-07-03 NOTE — Assessment & Plan Note (Signed)
Slow to resolve COPD flare after recent pneumonia.  With postinfectious cough. Recent chest x-ray showed clearance of pneumonia.  We will treat for cough control along with trigger prevention.  Short prednisone burst  Plan  Patient Instructions  Mucinex DM Twice daily  As needed  cough/congestion  Fluids and rest.  Tylenol As needed   Continue on Dulera 2 puffs Twice daily , rinse after use.  Spiriva daily  Continue Daliresp daily .  Increase Prednisone '20mg'$  daily for 1 week, then Prednisone  '10mg'$  daily.  Begin Delsym 2 tsp Twice daily  for cough As needed   Begin Tessalon Three times a day  for cough As needed   Continue on Claritin '10mg'$  daily.  Albuterol inhaler or neb As needed   Continue on Oxygen 2l/m as needed to keep Oxygen Levels >88-90%.  Continue on Trilogy vent /BIPAP At bedtime  with Oxygen 4l/m  Great job not smoking .  Follow up with [name] or Gaylin Bulthuis NP in 3 months w/  PFT and As needed   Please contact office for sooner follow up if symptoms do not improve or worsen or seek emergency care

## 2022-07-03 NOTE — Patient Instructions (Addendum)
Mucinex DM Twice daily  As needed  cough/congestion  Fluids and rest.  Tylenol As needed   Continue on Dulera 2 puffs Twice daily , rinse after use.  Spiriva daily  Continue Daliresp daily .  Increase Prednisone '20mg'$  daily for 1 week, then Prednisone  '10mg'$  daily.  Begin Delsym 2 tsp Twice daily  for cough As needed   Begin Tessalon Three times a day  for cough As needed   Continue on Claritin '10mg'$  daily.  Albuterol inhaler or neb As needed   Continue on Oxygen 2l/m as needed to keep Oxygen Levels >88-90%.  Continue on Trilogy vent /BIPAP At bedtime  with Oxygen 4l/m  Great job not smoking .  Follow up with [name] or Jamil Armwood NP in 3 months w/  PFT and As needed   Please contact office for sooner follow up if symptoms do not improve or worsen or seek emergency care

## 2022-07-03 NOTE — Assessment & Plan Note (Signed)
Continue on nocturnal trilogy vent with oxygen

## 2022-07-06 ENCOUNTER — Encounter: Payer: Self-pay | Admitting: Student

## 2022-07-06 ENCOUNTER — Encounter: Payer: Medicaid Other | Admitting: Student

## 2022-07-06 ENCOUNTER — Ambulatory Visit (INDEPENDENT_AMBULATORY_CARE_PROVIDER_SITE_OTHER): Payer: 59 | Admitting: Student

## 2022-07-06 VITALS — BP 120/70 | HR 83 | Ht 70.0 in | Wt 223.0 lb

## 2022-07-06 DIAGNOSIS — I5022 Chronic systolic (congestive) heart failure: Secondary | ICD-10-CM

## 2022-07-06 DIAGNOSIS — G4733 Obstructive sleep apnea (adult) (pediatric): Secondary | ICD-10-CM

## 2022-07-06 DIAGNOSIS — I5042 Chronic combined systolic (congestive) and diastolic (congestive) heart failure: Secondary | ICD-10-CM | POA: Diagnosis not present

## 2022-07-06 DIAGNOSIS — I442 Atrioventricular block, complete: Secondary | ICD-10-CM | POA: Diagnosis not present

## 2022-07-08 ENCOUNTER — Ambulatory Visit: Payer: 59 | Admitting: Orthopaedic Surgery

## 2022-07-08 ENCOUNTER — Ambulatory Visit (INDEPENDENT_AMBULATORY_CARE_PROVIDER_SITE_OTHER): Payer: 59

## 2022-07-08 DIAGNOSIS — I442 Atrioventricular block, complete: Secondary | ICD-10-CM

## 2022-07-09 ENCOUNTER — Ambulatory Visit: Payer: 59 | Admitting: Orthopaedic Surgery

## 2022-07-09 LAB — CUP PACEART REMOTE DEVICE CHECK
Battery Remaining Longevity: 96 mo
Battery Remaining Percentage: 89 %
Battery Voltage: 3.01 V
Brady Statistic AP VP Percent: 13 %
Brady Statistic AP VS Percent: 1.4 %
Brady Statistic AS VP Percent: 56 %
Brady Statistic AS VS Percent: 29 %
Brady Statistic RA Percent Paced: 14 %
Brady Statistic RV Percent Paced: 69 %
Date Time Interrogation Session: 20230719033615
Implantable Lead Implant Date: 20220427
Implantable Lead Implant Date: 20220427
Implantable Lead Location: 753859
Implantable Lead Location: 753860
Implantable Pulse Generator Implant Date: 20220427
Lead Channel Impedance Value: 480 Ohm
Lead Channel Impedance Value: 640 Ohm
Lead Channel Pacing Threshold Amplitude: 0.5 V
Lead Channel Pacing Threshold Amplitude: 0.75 V
Lead Channel Pacing Threshold Pulse Width: 0.5 ms
Lead Channel Pacing Threshold Pulse Width: 0.5 ms
Lead Channel Sensing Intrinsic Amplitude: 10.4 mV
Lead Channel Sensing Intrinsic Amplitude: 2.9 mV
Lead Channel Setting Pacing Amplitude: 2 V
Lead Channel Setting Pacing Amplitude: 2.5 V
Lead Channel Setting Pacing Pulse Width: 0.5 ms
Lead Channel Setting Sensing Sensitivity: 2 mV
Pulse Gen Model: 2272
Pulse Gen Serial Number: 3920156

## 2022-07-13 ENCOUNTER — Telehealth: Payer: Self-pay

## 2022-07-13 MED ORDER — EPINEPHRINE 0.3 MG/0.3ML IJ SOAJ: 0.3000 mg | INTRAMUSCULAR | 0 refills | Status: AC | PRN

## 2022-07-13 NOTE — Telephone Encounter (Signed)
Pt is requesting a refill on: EPINEPHrine 0.3 mg/0.3 mL IJ SOAJ injection  Pharmacy: [facility], [place] - [id number] AT [address] 06/29/22

## 2022-07-14 ENCOUNTER — Institutional Professional Consult (permissible substitution): Payer: 59 | Admitting: Neurology

## 2022-07-14 ENCOUNTER — Encounter: Payer: Self-pay | Admitting: Neurology

## 2022-07-15 ENCOUNTER — Ambulatory Visit (INDEPENDENT_AMBULATORY_CARE_PROVIDER_SITE_OTHER): Payer: 59 | Admitting: Family

## 2022-07-15 ENCOUNTER — Telehealth: Payer: Self-pay | Admitting: Cardiology

## 2022-07-15 ENCOUNTER — Encounter: Payer: Self-pay | Admitting: Family

## 2022-07-15 VITALS — BP 108/78 | HR 73 | Temp 97.8°F | Ht 70.0 in | Wt 226.4 lb

## 2022-07-15 DIAGNOSIS — R21 Rash and other nonspecific skin eruption: Secondary | ICD-10-CM

## 2022-07-15 MED ORDER — SULFAMETHOXAZOLE-TRIMETHOPRIM 800-160 MG PO TABS: 1.0000 | ORAL_TABLET | Freq: Two times a day (BID) | ORAL | 0 refills | Status: DC

## 2022-07-15 MED ORDER — TRIAMCINOLONE ACETONIDE 0.5 % EX OINT: 1.0000 | TOPICAL_OINTMENT | Freq: Two times a day (BID) | CUTANEOUS | 0 refills | Status: DC

## 2022-07-15 NOTE — Telephone Encounter (Signed)
Returned call to pt Discussed proceeding w/ procedure w/ MD. Pt aware advised to post pone until next week/after. Pt agreeable to r/s to 8/1. Reviewed instructions.  Aware NPO after 8am day of procedure. Patient verbalized understanding and agreeable to plan.

## 2022-07-15 NOTE — Telephone Encounter (Signed)
New Message:      Patient wants to know if he is still supposed to have his procedure tomorrow? If so, what are his instructions please?

## 2022-07-15 NOTE — Progress Notes (Signed)
Patient ID: Robert Church, male    DOB: [birth year], 66 y.o.   MRN: [id number]  Chief Complaint  Patient presents with   Cyst    Pt c/o a cyst on left pelvic area, Noticed it Monday    HPI: Skin rash:  along belt line, inflamed, with 2 blistered areas, erythema, very painful, pt has old scars in the area of past folliculitis per patient.  He has been applying A & D ointment without relief. Reports he is having pacemaker replacement surgery tomorrow.   Assessment & Plan:  1. Skin rash below panicula sending steroid ointment and antibiotic, advised on use & SE- advised on importance to reduce friction and moisture as much as possible. He is having pacemaker surgery tomorrow, advised he contact them today about the rash and ask if ok to start the Bactrim.  - triamcinolone ointment (KENALOG) 0.5 %; Apply 1 Application topically 2 (two) times daily.  Dispense: 30 g; Refill: 0 - sulfamethoxazole-trimethoprim (BACTRIM DS) 800-160 MG tablet; Take 1 tablet by mouth 2 (two) times daily after a meal.  Dispense: 10 tablet; Refill: 0   Subjective:    Outpatient Medications Prior to Visit  Medication Sig Dispense Refill   albuterol (PROVENTIL) (2.5 MG/3ML) 0.083% nebulizer solution Take 3 mLs (2.5 mg total) by nebulization every 4 (four) hours as needed for wheezing or shortness of breath. 75 mL 11   albuterol (VENTOLIN HFA) 108 (90 Base) MCG/ACT inhaler INHALE 2 PUFFS INTO THE LUNGS EVERY 6 HOURS AS NEEDED FOR WHEEZING 18 g 2   allopurinol (ZYLOPRIM) 300 MG tablet Take 1 tablet (300 mg total) by mouth daily. (Patient taking differently: Take 300 mg by mouth daily as needed (gout pain).) 90 tablet 0   ALPRAZolam (XANAX) 0.5 MG tablet Take 1 tablet (0.5 mg total) by mouth 2 (two) times daily as needed for anxiety. 180 tablet 0   AMBULATORY NON FORMULARY MEDICATION Medication Name: Nitroglycerin gel 0.125% apply 2-3 times daily to the anal canal. (Patient taking differently: Place 1 application  rectally  in the morning, at noon, and at bedtime. Medication Name: Nitroglycerin gel 0.125% apply 2-3 times daily to the anal canal.) 30 g 1   cholecalciferol (VITAMIN D3) 25 MCG (1000 UNIT) tablet Take 1,000 Units by mouth daily.     colchicine 0.6 MG tablet Take 0.6 mg by mouth daily as needed (gout pain).     EPINEPHrine 0.3 mg/0.3 mL IJ SOAJ injection Inject 0.3 mg into the muscle as needed for anaphylaxis. 1 each 0   fluticasone (FLONASE) 50 MCG/ACT nasal spray SHAKE LIQUID AND USE 2 SPRAYS IN EACH NOSTRIL DAILY (Patient taking differently: Place 2 sprays into both nostrils daily as needed for allergies.) 16 g 2   Glucosamine HCl (GLUCOSAMINE PO) Take 1 tablet by mouth daily.     guaiFENesin (MUCINEX) 600 MG 12 hr tablet Take 1 tablet (600 mg total) by mouth 2 (two) times daily. (Patient taking differently: Take 600 mg by mouth daily.) 60 tablet 5   ipratropium (ATROVENT) 0.06 % nasal spray Place 2 sprays into both nostrils 4 (four) times daily. 15 mL 3   Lidocaine, Anorectal, 5 % CREA Apply 1 application topically 2 (two) times daily. 30 g 1   mometasone-formoterol (DULERA) 100-5 MCG/ACT AERO Inhale 2 puffs into the lungs 2 (two) times daily.     montelukast (SINGULAIR) 10 MG tablet TAKE 1 TABLET(10 MG) BY MOUTH AT BEDTIME 30 tablet 5   nicotine (NICODERM CQ - DOSED IN  MG/24 HOURS) 14 mg/24hr patch Place 1 patch (14 mg total) onto the skin daily. 28 patch 0   omeprazole (PRILOSEC) 20 MG capsule Take 1 capsule (20 mg total) by mouth 2 (two) times daily before a meal. ACID REFLUX 180 capsule 1   polyethylene glycol (MIRALAX / GLYCOLAX) 17 g packet Take 17 g by mouth daily as needed. 14 each 0   potassium chloride SA (KLOR-CON M20) 20 MEQ tablet Take 1 tablet (20 mEq total) by mouth daily. 90 tablet 3   predniSONE (DELTASONE) 10 MG tablet TAKE 1 TABLET(10 MG) BY MOUTH DAILY WITH BREAKFAST 30 tablet 5   predniSONE (DELTASONE) 20 MG tablet Take 1 tablet (20 mg total) by mouth daily with breakfast. (Patient  not taking: Reported on 07/15/2022) 7 tablet 0   Roflumilast (DALIRESP) 250 MCG TABS Take 1 tablet by mouth daily. (Patient taking differently: Take 1 tablet by mouth in the morning and at bedtime.) 30 tablet 11   Testosterone 20.25 MG/1.25GM (1.62%) GEL Place 1.25 g onto the skin daily. 112.5 g 0   Tiotropium Bromide Monohydrate (SPIRIVA RESPIMAT) 2.5 MCG/ACT AERS Inhale 2 puffs into the lungs daily. 4 g 4   torsemide (DEMADEX) 20 MG tablet Take 20 mg by mouth daily.     traMADol (ULTRAM) 50 MG tablet Take 1 tablet (50 mg total) by mouth every 8 (eight) hours as needed. 90 tablet 2   vitamin B-12 (CYANOCOBALAMIN) 1000 MCG tablet Take 1,000 mcg by mouth daily.     No facility-administered medications prior to visit.   Past Medical History:  Diagnosis Date   Adenomatous colon polyp    Allergy    Anal fissure    Arthritis    Asthma    Bronchitis    CHF (congestive heart failure) (HCC)    COPD, group D, by GOLD 2017 classification ([place])    Dyspnea    Emphysema lung (HCC)    GERD (gastroesophageal reflux disease)    History of hiatal hernia    Hyperlipidemia    Hypertension    NICM (nonischemic cardiomyopathy) (HCC)    OSA (obstructive sleep apnea)    Pneumonia    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   Requires supplemental oxygen    Sinusitis    SOB (shortness of breath)    Past Surgical History:  Procedure Laterality Date   BIV UPGRADE N/A 10/07/2021   Procedure: BIV PPM UPGRADE;  Surgeon: Constance Haw, MD;  Location: [facility];  Service: Cardiovascular;  Laterality: N/A;   LEFT HEART CATH AND CORONARY ANGIOGRAPHY N/A 04/19/2017   Procedure: Left Heart Cath and Coronary Angiography;  Surgeon: Belva Crome, MD;  Location: [facility];  Service: Cardiovascular;  Laterality: N/A;   PACEMAKER IMPLANT N/A 04/16/2021   Procedure: PACEMAKER IMPLANT;  Surgeon: Constance Haw, MD;  Location: [facility];  Service: Cardiovascular;   Laterality: N/A;   RIGHT/LEFT HEART CATH AND CORONARY ANGIOGRAPHY N/A 08/22/2021   Procedure: RIGHT/LEFT HEART CATH AND CORONARY ANGIOGRAPHY;  Surgeon: Jolaine Artist, MD;  Location: [facility];  Service: Cardiovascular;  Laterality: N/A;   TONSILLECTOMY     Allergies  Allergen Reactions   Spironolactone Anaphylaxis   Daliresp [Roflumilast] Other (See Comments)    Dizzy, headache, leg pain per patient. 02/25/22.   Need to take in smaller doses    Jardiance [Empagliflozin] Nausea Only and Other (See Comments)    Lightheadness Dizziness   Penicillins  Swelling and Other (See Comments)    Childhood rxn--MD stated he "almost died" Has patient had a PCN reaction causing immediate rash, facial/tongue/throat swelling, SOB or lightheadedness with hypotension:Yes Has patient had a PCN reaction causing severe rash involving mucus membranes or skin necrosis:unsure Has patient had a PCN reaction that required hospitalization:unsure Has patient had a PCN reaction occurring within the last 10 years:No If all of the above answers are "NO", then may proceed with Cephalosporin use.     Clindamycin Other (See Comments)    Tongue swelling   Doxycycline Nausea And Vomiting    Severe stomach upset per patient   E-Mycin [Erythromycin Base] Swelling   Rosuvastatin Other (See Comments)    Myalgias (intolerance)      Objective:    Physical Exam Vitals and nursing note reviewed.  Constitutional:      General: He is not in acute distress.    Appearance: Normal appearance.  HENT:     Head: Normocephalic.  Cardiovascular:     Rate and Rhythm: Normal rate and regular rhythm.  Pulmonary:     Effort: Pulmonary effort is normal.     Breath sounds: Normal breath sounds.  Abdominal:     General: Abdomen is protuberant. There is distension.    Musculoskeletal:        General: Normal range of motion.     Cervical back: Normal range of motion.  Skin:    General: Skin is warm and dry.      Findings: Rash (erythema, with blistered areas, see abdomen diagram) present. Rash is not papular, pustular or vesicular.  Neurological:     Mental Status: He is alert and oriented to person, place, and time.  Psychiatric:        Mood and Affect: Mood normal.   There were no vitals taken for this visit. Wt Readings from Last 3 Encounters:  07/06/22 223 lb (101.2 kg)  07/03/22 226 lb (102.5 kg)  06/29/22 227 lb (103 kg)       Jeanie Sewer, NP

## 2022-07-16 ENCOUNTER — Encounter: Payer: 59 | Admitting: Physical Medicine and Rehabilitation

## 2022-07-20 NOTE — Telephone Encounter (Signed)
Returned pt call. Moved CRT upgrade to 11/21. Aware I will contact him  at later date to review instructions. Patient verbalized understanding and agreeable to plan.

## 2022-07-20 NOTE — Telephone Encounter (Signed)
Left message to call back  

## 2022-07-20 NOTE — Telephone Encounter (Signed)
Pt is calling back to discuss further the post pone-ing of his appt. He states that he may have another infection the has occurred and would like to discuss his options.

## 2022-07-22 ENCOUNTER — Other Ambulatory Visit: Payer: Self-pay | Admitting: Internal Medicine

## 2022-07-22 ENCOUNTER — Telehealth: Payer: Self-pay | Admitting: Internal Medicine

## 2022-07-22 DIAGNOSIS — M1A0790 Idiopathic chronic gout, unspecified ankle and foot, without tophus (tophi): Secondary | ICD-10-CM

## 2022-07-22 NOTE — Telephone Encounter (Signed)
Patient would like his xanax refilled - patient states that he has previously talked with Dr. Ronnald Ramp about moving these up to a 1 mg. From a 0.5.  He would like these called into [facility]  Last visit:  06/29/2022  No future visit scheduled

## 2022-07-23 ENCOUNTER — Other Ambulatory Visit: Payer: Self-pay | Admitting: Internal Medicine

## 2022-07-23 DIAGNOSIS — F411 Generalized anxiety disorder: Secondary | ICD-10-CM

## 2022-07-23 MED ORDER — ALPRAZOLAM 0.5 MG PO TABS: 0.5000 mg | ORAL_TABLET | Freq: Three times a day (TID) | ORAL | 0 refills | Status: DC | PRN

## 2022-07-30 ENCOUNTER — Encounter: Payer: 59 | Admitting: Physical Medicine and Rehabilitation

## 2022-08-03 NOTE — Progress Notes (Signed)
Remote pacemaker transmission.   

## 2022-08-10 ENCOUNTER — Telehealth: Payer: Self-pay | Admitting: Emergency Medicine

## 2022-08-10 MED ORDER — ALBUTEROL SULFATE HFA 108 (90 BASE) MCG/ACT IN AERS: INHALATION_SPRAY | RESPIRATORY_TRACT | 4 refills | Status: DC

## 2022-08-10 NOTE — Telephone Encounter (Signed)
Called patient and went over medication. Patient states that he wants name brand only. Verified pharmacy. Nothing further needed

## 2022-08-11 ENCOUNTER — Institutional Professional Consult (permissible substitution): Payer: 59 | Admitting: Neurology

## 2022-08-17 ENCOUNTER — Encounter: Payer: Self-pay | Admitting: Physical Medicine and Rehabilitation

## 2022-08-17 ENCOUNTER — Ambulatory Visit (INDEPENDENT_AMBULATORY_CARE_PROVIDER_SITE_OTHER): Payer: 59 | Admitting: Physical Medicine and Rehabilitation

## 2022-08-17 ENCOUNTER — Ambulatory Visit: Payer: Self-pay

## 2022-08-17 VITALS — BP 146/67 | HR 60

## 2022-08-17 DIAGNOSIS — M5416 Radiculopathy, lumbar region: Secondary | ICD-10-CM

## 2022-08-17 MED ORDER — METHYLPREDNISOLONE ACETATE 80 MG/ML IJ SUSP
80.0000 mg | Freq: Once | INTRAMUSCULAR | Status: AC
  Administered 2022-08-17: 80 mg

## 2022-08-17 NOTE — Patient Instructions (Signed)

## 2022-08-17 NOTE — Progress Notes (Unsigned)
Pt state lower back pain that travels down both legs to his feet. Pt state walking, standing and sitting makes the pain worse. Pt state he take [place] meds ot help ease his pain.  Numeric Pain Rating Scale and Functional Assessment Average Pain 8   In the last MONTH (on 0-10 scale) has pain interfered with the following?  1. General activity like being  able to carry out your everyday physical activities such as walking, climbing stairs, carrying groceries, or moving a chair?  Rating(10)   +Driver, -BT, -Dye Allergies.

## 2022-08-20 NOTE — Progress Notes (Signed)
Robert Church - 66 y.o. male MRN [id number]  Date of birth: 04-21-1956  Office Visit Note: Visit Date: 08/17/2022 PCP: Janith Lima, MD Referred by: Janith Lima, MD  Subjective: Chief Complaint  Patient presents with   Lower Back - Pain   Right Leg - Pain   Left Leg - Pain   HPI:  Robert Church is a 66 y.o. male who comes in today at the request of Dr. Joni Fears for planned Bilateral L5-S1 Lumbar Transforaminal epidural steroid injection with fluoroscopic guidance.  The patient has failed conservative care including home exercise, medications, time and activity modification.  This injection will be diagnostic and hopefully therapeutic.  Please see requesting physician notes for further details and justification.   ROS Otherwise per HPI.  Assessment & Plan: Visit Diagnoses:    ICD-10-CM   1. Lumbar radiculopathy  M54.16 XR C-ARM NO REPORT    Epidural Steroid injection    methylPREDNISolone acetate (DEPO-MEDROL) injection 80 mg      Plan: No additional findings.   Meds & Orders:  Meds ordered this encounter  Medications   methylPREDNISolone acetate (DEPO-MEDROL) injection 80 mg    Orders Placed This Encounter  Procedures   XR C-ARM NO REPORT   Epidural Steroid injection    Follow-up: Return for visit to requesting provider as needed.   Procedures: No procedures performed  Lumbosacral Transforaminal Epidural Steroid Injection - Sub-Pedicular Approach with Fluoroscopic Guidance  Patient: Robert Church      Date of Birth: December 10, 1956 MRN: [id number] PCP: Janith Lima, MD      Visit Date: 08/17/2022   Universal Protocol:    Date/Time: 08/17/2022  Consent Given By: the patient  Position: PRONE  Additional Comments: Vital signs were monitored before and after the procedure. Patient was prepped and draped in the usual sterile fashion. The correct patient, procedure, and site was verified.   Injection Procedure Details:   Procedure diagnoses:  Lumbar radiculopathy [M54.16]    Meds Administered:  Meds ordered this encounter  Medications   methylPREDNISolone acetate (DEPO-MEDROL) injection 80 mg    Laterality: Bilateral  Location/Site: L5  Needle:5.0 in., 22 ga.  Short bevel or Quincke spinal needle  Needle Placement: Transforaminal  Findings:    -Comments: Excellent flow of contrast along the nerve, nerve root and into the epidural space.  Procedure Details: After squaring off the end-plates to get a true AP view, the C-arm was positioned so that an oblique view of the foramen as noted above was visualized. The target area is just inferior to the "nose of the scotty dog" or sub pedicular. The soft tissues overlying this structure were infiltrated with 2-3 ml. of 1% Lidocaine without Epinephrine.  The spinal needle was inserted toward the target using a "trajectory" view along the fluoroscope beam.  Under AP and lateral visualization, the needle was advanced so it did not puncture dura and was located close the 6 O'Clock position of the pedical in AP tracterory. Biplanar projections were used to confirm position. Aspiration was confirmed to be negative for CSF and/or blood. A 1-2 ml. volume of Isovue-250 was injected and flow of contrast was noted at each level. Radiographs were obtained for documentation purposes.   After attaining the desired flow of contrast documented above, a 0.5 to 1.0 ml test dose of 0.25% Marcaine was injected into each respective transforaminal space.  The patient was observed for 90 seconds post injection.  After no sensory deficits were reported, and  normal lower extremity motor function was noted,   the above injectate was administered so that equal amounts of the injectate were placed at each foramen (level) into the transforaminal epidural space.   Additional Comments:  The patient tolerated the procedure well Dressing: 2 x 2 sterile gauze and Band-Aid    Post-procedure details: Patient was  observed during the procedure. Post-procedure instructions were reviewed.  Patient left the clinic in stable condition.    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 Bilateral foraminal stenosis additionally at this level that could affect either C6 nerve.   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 No apparent compressive stenosis at this level.   Chronic facet fusion on the right at C3-4. Right foraminal narrowing that could affect the right C4 nerve.   C4-5: Disc bulge and facet osteoarthritis worse on the right. Mild right foraminal stenosis.   C6-7: Bilateral foraminal narrowing of a moderate degree that could possibly be symptomatic.     Electronically Signed By: Nelson Chimes M.D. On: 06/03/2021 13:46 --------------------------------- CT of Lumbar Spine IMPRESSION: No apparent compressive central canal stenosis.   Mild non-compressive disc bulges L2-3 and L3-4.   Facet osteoarthritis at L4-5 left worse than right. Left foraminal narrowing due to encroachment by bulging disc material could possibly affect the left L4 nerve.   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 No apparent compressive stenosis of the canal or lateral recesses. Mild to moderate foraminal narrowing on the right could possibly affect the exiting L5 nerve.     Electronically Signed   By: Nelson Chimes M.D.   On: 06/03/2021 13:50     Objective:  VS:  HT:    WT:   BMI:     BP:(!) 146/67  HR:60bpm  TEMP: ( )  RESP:  Physical Exam Vitals and nursing note reviewed.  Constitutional:      General: He is not in acute distress.    Appearance: Normal appearance. He is obese. He is not ill-appearing.  HENT:     Head: Normocephalic and atraumatic.     Right Ear: External ear normal.      Left Ear: External ear normal.     Nose: No congestion.  Eyes:     Extraocular Movements: Extraocular movements intact.  Cardiovascular:     Rate and Rhythm: Normal rate.     Pulses: Normal pulses.  Pulmonary:     Effort: Pulmonary effort is normal. No respiratory distress.  Abdominal:     General: There is no distension.     Palpations: Abdomen is soft.  Musculoskeletal:        General: No tenderness or signs of injury.     Cervical back: Neck supple.     Right lower leg: No edema.     Left lower leg: No edema.     Comments: Patient has good distal strength without clonus.  Skin:    Findings: No erythema or rash.  Neurological:     General: No focal deficit present.     Mental Status: He is alert and oriented to person, place, and time.     Sensory: No sensory deficit.     Motor: No weakness or abnormal muscle tone.     Coordination: Coordination normal.  Psychiatric:        Mood and Affect: Mood normal.  Behavior: Behavior normal.      Imaging: No results found.

## 2022-08-20 NOTE — Procedures (Signed)
Lumbosacral Transforaminal Epidural Steroid Injection - Sub-Pedicular Approach with Fluoroscopic Guidance  Patient: Robert Church      Date of Birth: 1956-04-25 MRN: [id number] PCP: Janith Lima, MD      Visit Date: 08/17/2022   Universal Protocol:    Date/Time: 08/17/2022  Consent Given By: the patient  Position: PRONE  Additional Comments: Vital signs were monitored before and after the procedure. Patient was prepped and draped in the usual sterile fashion. The correct patient, procedure, and site was verified.   Injection Procedure Details:   Procedure diagnoses: Lumbar radiculopathy [M54.16]    Meds Administered:  Meds ordered this encounter  Medications   methylPREDNISolone acetate (DEPO-MEDROL) injection 80 mg    Laterality: Bilateral  Location/Site: L5  Needle:5.0 in., 22 ga.  Short bevel or Quincke spinal needle  Needle Placement: Transforaminal  Findings:    -Comments: Excellent flow of contrast along the nerve, nerve root and into the epidural space.  Procedure Details: After squaring off the end-plates to get a true AP view, the C-arm was positioned so that an oblique view of the foramen as noted above was visualized. The target area is just inferior to the "nose of the scotty dog" or sub pedicular. The soft tissues overlying this structure were infiltrated with 2-3 ml. of 1% Lidocaine without Epinephrine.  The spinal needle was inserted toward the target using a "trajectory" view along the fluoroscope beam.  Under AP and lateral visualization, the needle was advanced so it did not puncture dura and was located close the 6 O'Clock position of the pedical in AP tracterory. Biplanar projections were used to confirm position. Aspiration was confirmed to be negative for CSF and/or blood. A 1-2 ml. volume of Isovue-250 was injected and flow of contrast was noted at each level. Radiographs were obtained for documentation purposes.   After attaining the desired  flow of contrast documented above, a 0.5 to 1.0 ml test dose of 0.25% Marcaine was injected into each respective transforaminal space.  The patient was observed for 90 seconds post injection.  After no sensory deficits were reported, and normal lower extremity motor function was noted,   the above injectate was administered so that equal amounts of the injectate were placed at each foramen (level) into the transforaminal epidural space.   Additional Comments:  The patient tolerated the procedure well Dressing: 2 x 2 sterile gauze and Band-Aid    Post-procedure details: Patient was observed during the procedure. Post-procedure instructions were reviewed.  Patient left the clinic in stable condition.

## 2022-08-21 ENCOUNTER — Telehealth: Payer: Self-pay | Admitting: Emergency Medicine

## 2022-08-21 NOTE — Telephone Encounter (Signed)
Spoke with the pt  He asked if he could travel and not use his o2 at night  I advised we do not recommend this  He will contact DME to inquire about travel o2  Nothing further needed

## 2022-08-27 DIAGNOSIS — J449 Chronic obstructive pulmonary disease, unspecified: Secondary | ICD-10-CM | POA: Diagnosis not present

## 2022-08-27 DIAGNOSIS — J9610 Chronic respiratory failure, unspecified whether with hypoxia or hypercapnia: Secondary | ICD-10-CM | POA: Diagnosis not present

## 2022-08-28 DIAGNOSIS — I5042 Chronic combined systolic (congestive) and diastolic (congestive) heart failure: Secondary | ICD-10-CM | POA: Diagnosis not present

## 2022-08-28 DIAGNOSIS — J449 Chronic obstructive pulmonary disease, unspecified: Secondary | ICD-10-CM | POA: Diagnosis not present

## 2022-08-31 ENCOUNTER — Telehealth: Payer: Self-pay | Admitting: Physical Medicine and Rehabilitation

## 2022-08-31 ENCOUNTER — Encounter: Payer: 59 | Admitting: Physical Medicine and Rehabilitation

## 2022-08-31 NOTE — Telephone Encounter (Signed)
Cancelled.  

## 2022-08-31 NOTE — Progress Notes (Unsigned)
Electrophysiology Office Note Date: 09/01/2022  ID:  Robert Church, DOB Nov 27, 1956, MRN [id number]  PCP: Janith Lima, MD Primary Cardiologist: Quay Burow, MD Electrophysiologist: Constance Haw, MD   CC: Routine ICD follow-up  Robert Church is a 66 y.o. male seen today for [name], MD for routine electrophysiology followup.  Pt underwent Barostim implantation 01/29/2022 (commercial)  His goals for device titration are to walk into a building without SOB. Get to the point where he can ride motorcycle again (strength wise). He has sold it.   At prior  visit, any attempts to titrate device above 6.4 ma resulted in stim.  Seen back in July for consideration of reattempt at [place] / Left bundle pacing; Has been deferred to November given pt schedule.  He is overall feeling about the same. Pending PCP visit for pulmonology appt. Had to sit and rest in the lobby after walking from the parking lot. Denies syncope or chest pain.   Device History: St. Jude Dual Chamber PPM implanted 03/2021 for CHB. Failed Upgrade attempt 10/07/2021 Barostim implantation 01/29/2022   Past Medical History:  Diagnosis Date   Adenomatous colon polyp    Allergy    Anal fissure    Arthritis    Asthma    Bronchitis    CHF (congestive heart failure) ([place])    COPD, group D, by GOLD 2017 classification ([place])    Dyspnea    Emphysema lung (HCC)    GERD (gastroesophageal reflux disease)    History of hiatal hernia    Hyperlipidemia    Hypertension    NICM (nonischemic cardiomyopathy) (HCC)    OSA (obstructive sleep apnea)    Pneumonia    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   Requires supplemental oxygen    Sinusitis    SOB (shortness of breath)    Past Surgical History:  Procedure Laterality Date   BIV UPGRADE N/A 10/07/2021   Procedure: BIV PPM UPGRADE;  Surgeon: Constance Haw, MD;  Location: [place] [facility];  Service: Cardiovascular;  Laterality:  N/A;   LEFT HEART CATH AND CORONARY ANGIOGRAPHY N/A 04/19/2017   Procedure: Left Heart Cath and Coronary Angiography;  Surgeon: Belva Crome, MD;  Location: [place] [facility];  Service: Cardiovascular;  Laterality: N/A;   PACEMAKER IMPLANT N/A 04/16/2021   Procedure: PACEMAKER IMPLANT;  Surgeon: Constance Haw, MD;  Location: [place] [facility];  Service: Cardiovascular;  Laterality: N/A;   RIGHT/LEFT HEART CATH AND CORONARY ANGIOGRAPHY N/A 08/22/2021   Procedure: RIGHT/LEFT HEART CATH AND CORONARY ANGIOGRAPHY;  Surgeon: Jolaine Artist, MD;  Location: [facility];  Service: Cardiovascular;  Laterality: N/A;   TONSILLECTOMY      Current Outpatient Medications  Medication Sig Dispense Refill   albuterol (PROVENTIL) (2.5 MG/3ML) 0.083% nebulizer solution Take 3 mLs (2.5 mg total) by nebulization every 4 (four) hours as needed for wheezing or shortness of breath. 75 mL 11   albuterol (VENTOLIN HFA) 108 (90 Base) MCG/ACT inhaler INHALE 2 PUFFS INTO THE LUNGS EVERY 6 HOURS AS NEEDED FOR WHEEZING 18 g 4   allopurinol (ZYLOPRIM) 300 MG tablet TAKE 1 TABLET(300 MG) BY MOUTH DAILY (Patient taking differently: as needed.) 90 tablet 0   ALPRAZolam (XANAX) 0.5 MG tablet Take 1 tablet (0.5 mg total) by mouth 3 (three) times daily as needed for anxiety. 270 tablet 0   AMBULATORY NON FORMULARY MEDICATION Medication Name: Nitroglycerin gel 0.125% apply  2-3 times daily to the anal canal. (Patient taking differently: Place 1 application  rectally in the morning, at noon, and at bedtime. Medication Name: Nitroglycerin gel 0.125% apply 2-3 times daily to the anal canal.) 30 g 1   cholecalciferol (VITAMIN D3) 25 MCG (1000 UNIT) tablet Take 1,000 Units by mouth daily.     colchicine 0.6 MG tablet Take 0.6 mg by mouth daily as needed (gout pain).     EPINEPHrine 0.3 mg/0.3 mL IJ SOAJ injection Inject 0.3 mg into the muscle as needed for anaphylaxis. 1 each 0   fluticasone (FLONASE) 50 MCG/ACT nasal spray  SHAKE LIQUID AND USE 2 SPRAYS IN EACH NOSTRIL DAILY (Patient taking differently: Place 2 sprays into both nostrils daily as needed for allergies.) 16 g 2   guaiFENesin (MUCINEX) 600 MG 12 hr tablet Take 1 tablet (600 mg total) by mouth 2 (two) times daily. (Patient taking differently: Take 600 mg by mouth daily.) 60 tablet 5   ipratropium (ATROVENT) 0.06 % nasal spray Place 2 sprays into both nostrils 4 (four) times daily. 15 mL 3   lidocaine (XYLOCAINE) 5 % ointment daily.     Lidocaine, Anorectal, 5 % CREA Apply 1 application topically 2 (two) times daily. 30 g 1   mometasone-formoterol (DULERA) 100-5 MCG/ACT AERO Inhale 2 puffs into the lungs 2 (two) times daily.     montelukast (SINGULAIR) 10 MG tablet TAKE 1 TABLET(10 MG) BY MOUTH AT BEDTIME 30 tablet 5   nicotine (NICODERM CQ - DOSED IN MG/24 HOURS) 14 mg/24hr patch Place 1 patch (14 mg total) onto the skin daily. 28 patch 0   omeprazole (PRILOSEC) 20 MG capsule Take 1 capsule (20 mg total) by mouth 2 (two) times daily before a meal. ACID REFLUX 180 capsule 1   polyethylene glycol (MIRALAX / GLYCOLAX) 17 g packet Take 17 g by mouth daily as needed. 14 each 0   potassium chloride SA (KLOR-CON M20) 20 MEQ tablet Take 1 tablet (20 mEq total) by mouth daily. (Patient taking differently: Take 20 mEq by mouth every other day.) 90 tablet 3   predniSONE (DELTASONE) 10 MG tablet TAKE 1 TABLET(10 MG) BY MOUTH DAILY WITH BREAKFAST 30 tablet 5   Roflumilast (DALIRESP) 250 MCG TABS Take 1 tablet by mouth daily. (Patient taking differently: Take 1 tablet by mouth in the morning and at bedtime.) 30 tablet 11   Testosterone 20.25 MG/1.25GM (1.62%) GEL Place 1.25 g onto the skin daily. 112.5 g 0   Tiotropium Bromide Monohydrate (SPIRIVA RESPIMAT) 2.5 MCG/ACT AERS Inhale 2 puffs into the lungs daily. 4 g 4   torsemide (DEMADEX) 20 MG tablet Take 20 mg by mouth daily.     traMADol (ULTRAM) 50 MG tablet Take 1 tablet (50 mg total) by mouth every 8 (eight) hours as  needed. 90 tablet 2   vitamin B-12 (CYANOCOBALAMIN) 1000 MCG tablet Take 1,000 mcg by mouth daily.     No current facility-administered medications for this visit.    Allergies:   Spironolactone, Daliresp [roflumilast], Jardiance [empagliflozin], Penicillins, Clindamycin, Doxycycline, E-mycin [erythromycin base], and Rosuvastatin   Social History: Social History   Socioeconomic History   Marital status: Single    Spouse name: Not on file   Number of children: 1   Years of education: 12   Highest education level: Not on file  Occupational History   Occupation: disabled  Tobacco Use   Smoking status: Former    Packs/day: 2.00    Years: 46.00  Total pack years: 92.00    Types: Cigarettes    Quit date: 01/27/2022    Years since quitting: 0.5    Passive exposure: Past   Smokeless tobacco: Never   Tobacco comments:    Had 3 cigarettes in past 2 weeks hfb 07/03/2022  Vaping Use   Vaping Use: Never used  Substance and Sexual Activity   Alcohol use: Not Currently    Alcohol/week: 28.0 standard drinks of alcohol    Types: 28 Cans of beer per week   Drug use: No   Sexual activity: Yes    Partners: Female    Birth control/protection: None  Other Topics Concern   Not on file  Social History Narrative   Right handed   Tea sometimes and coffee (1/2 caffeine)   Social Determinants of Health   Financial Resource Strain: Medium Risk (04/24/2021)   Overall Financial Resource Strain (CARDIA)    Difficulty of Paying Living Expenses: Somewhat hard  Food Insecurity: No Food Insecurity (04/16/2021)   Hunger Vital Sign    Worried About Running Out of Food in the Last Year: Never true    Ran Out of Food in the Last Year: Never true  Transportation Needs: No Transportation Needs (04/16/2021)   PRAPARE - Hydrologist (Medical): No    Lack of Transportation (Non-Medical): No  Physical Activity: Not on file  Stress: Not on file  Social Connections: Not on file   Intimate Partner Violence: Not on file    Family History: Family History  Problem Relation Age of Onset   Lung cancer Father    High blood pressure Mother    Diabetes Maternal Grandmother    Colon polyps Neg Hx    Esophageal cancer Neg Hx    Pancreatic cancer Neg Hx    Stomach cancer Neg Hx     Review of systems complete and found to be negative unless listed in HPI.    Physical Exam: Vitals:   09/01/22 1113  BP: 116/72  Pulse: 62  SpO2: 96%  Weight: 226 lb 12.8 oz (102.9 kg)  Height: '5\' 10"'$  (1.778 m)   General: Pleasant, NAD. No resp difficulty Psych: Normal affect. HEENT:  Normal, without mass or lesion.         Neck: Supple, no bruits or JVD. Carotids 2+. No lymphadenopathy/thyromegaly appreciated. Heart: PMI nondisplaced. RRR no s3, s4, or murmurs. Lungs:  Resp regular and unlabored, CTA. Abdomen: Soft, non-tender, non-distended, No HSM, BS + x 4.   Extremities: No clubbing, cyanosis or edema. DP/PT/Radials 2+ and equal bilaterally. Neuro: Alert and oriented X 3. Moves all extremities spontaneously.   PPM interrogation- Not checked today.  Barostim interrogation: Interrogated personally today. Impedance stable. See scanned report.   EKG:  EKG is not ordered today.  Recent Labs: 02/16/2022: NT-Pro BNP 1,090 06/05/2022: ALT 19 06/19/2022: B Natriuretic Peptide 159.1; BUN 17; Creatinine, Ser 1.23; Hemoglobin 14.9; Platelets 200; Potassium 3.8; Sodium 141   Wt Readings from Last 3 Encounters:  09/01/22 226 lb 12.8 oz (102.9 kg)  07/15/22 226 lb 6 oz (102.7 kg)  07/06/22 223 lb (101.2 kg)     Other studies Reviewed: Additional studies/ records that were reviewed today include: Previous EP and vascular office notes.   Assessment and Plan:  1.  Chronic systolic dysfunction s/p St. Jude  Dual chamber PPM S/p Barostim (commercial) 01/29/2022 Volume status stable on exam Continue torsemide 40 mg BID.  NYHA III symptoms chronically Stable on an appropriate  medical  regimen BAT titrated to 6.0 ma. He had to recurrent lump and pain in left neck at 8.0 that released at 7.0 ma. Given 1 ma safety margin. Failed CRT upgrade due to poor CS options. Planning attempt at left bundle lead later this year.  Encouraged to remain active.    2. ? Atrial fibrillation Previously monitor strips without clear AF Continue to monitor via device.   Current medicines are reviewed at length with the patient today.  Disposition:  Follow up with Dr. Curt Bears as usual post upgrade (attempt) Next Barostim visit 6 mo.   Jacalyn Lefevre, PA-C  09/01/2022 11:23 AM  [facility] [address] [place] [address] [phone] (office) [phone] (fax)

## 2022-08-31 NOTE — Telephone Encounter (Signed)
Pt called requesting to cancel appt today. Pt states he will call tomorrow to reschedule

## 2022-09-01 ENCOUNTER — Ambulatory Visit: Payer: 59 | Attending: Student | Admitting: Student

## 2022-09-01 ENCOUNTER — Encounter: Payer: 59 | Admitting: *Deleted

## 2022-09-01 ENCOUNTER — Encounter: Payer: Self-pay | Admitting: Student

## 2022-09-01 ENCOUNTER — Telehealth: Payer: Self-pay | Admitting: Physical Medicine and Rehabilitation

## 2022-09-01 VITALS — BP 116/72 | HR 62 | Ht 70.0 in | Wt 226.8 lb

## 2022-09-01 DIAGNOSIS — G4733 Obstructive sleep apnea (adult) (pediatric): Secondary | ICD-10-CM | POA: Diagnosis not present

## 2022-09-01 DIAGNOSIS — Z006 Encounter for examination for normal comparison and control in clinical research program: Secondary | ICD-10-CM

## 2022-09-01 DIAGNOSIS — I442 Atrioventricular block, complete: Secondary | ICD-10-CM

## 2022-09-01 DIAGNOSIS — I5042 Chronic combined systolic (congestive) and diastolic (congestive) heart failure: Secondary | ICD-10-CM | POA: Diagnosis not present

## 2022-09-01 NOTE — Research (Signed)
REBALANCE  Informed Consent   Subject Name: Robert Church  Subject met inclusion and exclusion criteria.  The informed consent form, study requirements and expectations were reviewed with the subject and questions and concerns were addressed prior to the signing of the consent form.  The subject verbalized understanding of the trial requirements.  The subject agreed to participate in the REBALANCE  trial and signed the informed consent  on 09-01-2022.  The informed consent was obtained prior to performance of any protocol-specific procedures for the subject.  A copy of the signed informed consent was given to the subject and a copy was placed in the subject's medical record.   [name],Research Coordinator 09/01/2022  11:45 a.am

## 2022-09-01 NOTE — Telephone Encounter (Signed)
Patient called. Would like an appointment with Dr. Newton  

## 2022-09-01 NOTE — Patient Instructions (Signed)
Medication Instructions:  Your physician recommends that you continue on your current medications as directed. Please refer to the Current Medication list given to you today.  *If you need a refill on your cardiac medications before your next appointment, please call your pharmacy*   Lab Work: None If you have labs (blood work) drawn today and your tests are completely normal, you will receive your results only by: [place] (if you have MyChart) OR A paper copy in the mail If you have any lab test that is abnormal or we need to change your treatment, we will call you to review the results.   Follow-Up: At [facility], you and your health needs are our priority.  As part of our continuing mission to provide you with exceptional heart care, we have created designated Provider Care Teams.  These Care Teams include your primary Cardiologist (physician) and Advanced Practice Providers (APPs -  Physician Assistants and Nurse Practitioners) who all work together to provide you with the care you need, when you need it.  Your next appointment:   6 month(s)  The format for your next appointment:   In Person  Provider:   [name] "Oda Kilts, PA-C

## 2022-09-02 ENCOUNTER — Telehealth: Payer: Self-pay | Admitting: Physical Medicine and Rehabilitation

## 2022-09-02 NOTE — Telephone Encounter (Signed)
Patient returned call asked for a call back to schedule an appointment with Dr. Ernestina Patches. The number to contact patient is [phone]

## 2022-09-02 NOTE — Telephone Encounter (Signed)
Tried calling back to schedule. See note in referral.

## 2022-09-03 ENCOUNTER — Ambulatory Visit: Payer: 59 | Attending: Student | Admitting: Student

## 2022-09-03 ENCOUNTER — Telehealth: Payer: Self-pay | Admitting: Student

## 2022-09-03 DIAGNOSIS — I5042 Chronic combined systolic (congestive) and diastolic (congestive) heart failure: Secondary | ICD-10-CM | POA: Diagnosis not present

## 2022-09-03 NOTE — Progress Notes (Signed)
Pt added on for increase stim at his barostim site, consistent with prior attempts to uptitrate.   With recurrent stim at 6.0 ma @ 65 ms, will turn down to 5.0 ma @ 65 ms.  If continues we could ultimately decrease his pulse width further, but suspect low yield.   Otherwise he will keep his scheduled procedure in November and follow up in 6 months for regular Barostim check  Impedance stable. See scanned report.   [name] [address]" [place], PA-C  09/03/2022 1:14 PM

## 2022-09-03 NOTE — Telephone Encounter (Signed)
Patient called about his Barostim that was turned up the other day.  He said it's bothering him, he would like to come in to have it turned down just a little bit.  Jonni Sanger told him to give [place] a call and he can come in this week to have it adjusted.

## 2022-09-03 NOTE — Telephone Encounter (Signed)
Spoke with pt, he will come by the office today before 12:30 to have his BAROSTIM adjusted.

## 2022-09-03 NOTE — Telephone Encounter (Signed)
Tried calling-went straight to VM.

## 2022-09-08 DIAGNOSIS — I5042 Chronic combined systolic (congestive) and diastolic (congestive) heart failure: Secondary | ICD-10-CM | POA: Diagnosis not present

## 2022-09-08 DIAGNOSIS — J449 Chronic obstructive pulmonary disease, unspecified: Secondary | ICD-10-CM | POA: Diagnosis not present

## 2022-09-09 ENCOUNTER — Other Ambulatory Visit: Payer: Self-pay | Admitting: Emergency Medicine

## 2022-09-10 ENCOUNTER — Other Ambulatory Visit (HOSPITAL_COMMUNITY): Payer: Self-pay

## 2022-09-10 DIAGNOSIS — I5042 Chronic combined systolic (congestive) and diastolic (congestive) heart failure: Secondary | ICD-10-CM

## 2022-09-10 MED ORDER — LOSARTAN POTASSIUM 25 MG PO TABS: 12.5000 mg | ORAL_TABLET | Freq: Every day | ORAL | 3 refills | Status: DC

## 2022-09-11 ENCOUNTER — Other Ambulatory Visit: Payer: Self-pay | Admitting: Emergency Medicine

## 2022-09-11 ENCOUNTER — Telehealth: Payer: Self-pay | Admitting: Emergency Medicine

## 2022-09-11 MED ORDER — MOMETASONE FURO-FORMOTEROL FUM 100-5 MCG/ACT IN AERO: 2.0000 | INHALATION_SPRAY | Freq: Two times a day (BID) | RESPIRATORY_TRACT | 5 refills | Status: DC

## 2022-09-11 NOTE — Telephone Encounter (Signed)
Patient needs refill on [facility] sent in to a new pharmacy- [facility] [address]. All medications will need to go through this pharmacy from now on.   Patient is almost out of [facility]- please call patient when refill is sent in.

## 2022-09-11 NOTE — Telephone Encounter (Signed)
Rx for [name] has been sent to preferred pharmacy for pt. Called and spoke with pt letting him know this was done and he verbalized understanding.nothing further needed.

## 2022-09-14 ENCOUNTER — Telehealth: Payer: Self-pay

## 2022-09-14 ENCOUNTER — Ambulatory Visit (INDEPENDENT_AMBULATORY_CARE_PROVIDER_SITE_OTHER): Payer: 59

## 2022-09-14 ENCOUNTER — Other Ambulatory Visit (HOSPITAL_COMMUNITY): Payer: Self-pay

## 2022-09-14 ENCOUNTER — Encounter: Payer: Self-pay | Admitting: Adult Health

## 2022-09-14 ENCOUNTER — Ambulatory Visit: Payer: 59 | Admitting: Adult Health

## 2022-09-14 VITALS — BP 128/80 | HR 88 | Temp 97.8°F | Ht 70.0 in | Wt 233.2 lb

## 2022-09-14 DIAGNOSIS — I5042 Chronic combined systolic (congestive) and diastolic (congestive) heart failure: Secondary | ICD-10-CM | POA: Diagnosis not present

## 2022-09-14 DIAGNOSIS — J441 Chronic obstructive pulmonary disease with (acute) exacerbation: Secondary | ICD-10-CM | POA: Diagnosis not present

## 2022-09-14 DIAGNOSIS — J9611 Chronic respiratory failure with hypoxia: Secondary | ICD-10-CM | POA: Diagnosis not present

## 2022-09-14 DIAGNOSIS — G4733 Obstructive sleep apnea (adult) (pediatric): Secondary | ICD-10-CM | POA: Diagnosis not present

## 2022-09-14 DIAGNOSIS — J431 Panlobular emphysema: Secondary | ICD-10-CM

## 2022-09-14 NOTE — Addendum Note (Signed)
Addended by: [name] on: 09/14/2022 03:18 PM   Modules accepted: Orders

## 2022-09-14 NOTE — Assessment & Plan Note (Signed)
Continue on oxygen with activity and at bedtime to maintain O2 saturations greater than 88 to 9%. Patient remains on oxygen with his noninvasive vent at bedtime.  He is currently on 4 L.  Wants to see if he can tolerate 3 L as he wants to be able to get a portable concentrator for travel.  Smaller portable concentrator will only go up to 3 L continuous We will that up for overnight oximetry test on Trelegy with oxygen at 3 L

## 2022-09-14 NOTE — Assessment & Plan Note (Signed)
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  We will continue on triple therapy maintenance regimen.  Add in flutter valve to help with mucociliary clearance.  Check chest x-ray today.  Hold on antibiotics and steroids at this time.  Plan Patient Instructions  Mucinex DM Twice daily  As needed  cough/congestion  Fluids and rest.  Tylenol As needed   Continue on Dulera 2 puffs Twice daily , rinse after use.  Spiriva daily  Continue Daliresp daily .  Continue on Prednisone  '10mg'$  daily.   Delsym 2 tsp Twice daily  for cough As needed   Tessalon Three times a day  for cough As needed   Continue on Claritin '10mg'$  daily.  Albuterol inhaler or neb As needed   Continue on Oxygen 2l/m as needed to keep Oxygen Levels >88-90%.  Continue on Trilogy vent /BIPAP At bedtime  with Oxygen 3 l/m  Set up for Overnight oximetry test on Trilogy with Oxygen 3l/m  Add Flutter valve Twice daily   Chest xray and labs today .  Extra torsemide '20mg'$  daily for next 3 days (along with potassium)  Follow up with [name] or Beren Yniguez NP in 2 months  and As needed   Please contact office for sooner follow up if symptoms do not improve or worsen or seek emergency care

## 2022-09-14 NOTE — Telephone Encounter (Signed)
Patient Advocate Encounter  Prior Authorization for Roflumilast 250MCG tablets has been approved.    PA# [id number] Effective dates: 09/14/2022   Patient's copay is $10.00  [place] [place] [facility] Patient Advocate

## 2022-09-14 NOTE — Assessment & Plan Note (Signed)
Continue on nocturnal trilogy vent with oxygen Download shows excellent compliance

## 2022-09-14 NOTE — Patient Instructions (Addendum)
Mucinex DM Twice daily  As needed  cough/congestion  Fluids and rest.  Tylenol As needed   Continue on Dulera 2 puffs Twice daily , rinse after use.  Spiriva daily  Continue Daliresp daily .  Continue on Prednisone  '10mg'$  daily.   Delsym 2 tsp Twice daily  for cough As needed   Tessalon Three times a day  for cough As needed   Continue on Claritin '10mg'$  daily.  Albuterol inhaler or neb As needed   Continue on Oxygen 2l/m as needed to keep Oxygen Levels >88-90%.  Continue on Trilogy vent /BIPAP At bedtime  with Oxygen 3 l/m  Set up for Overnight oximetry test on Trilogy with Oxygen 3l/m  Add Flutter valve Twice daily   Chest xray and labs today .  Extra torsemide '20mg'$  daily for next 3 days (along with potassium)  Follow up with [name] or Jhayla Podgorski NP in 2 months  and As needed   Please contact office for sooner follow up if symptoms do not improve or worsen or seek emergency care

## 2022-09-14 NOTE — Telephone Encounter (Signed)
Patient called in stating since you made changes on his device he has been more tired than usual and wanted to know if he should come in for another change. Patient would like a call back to see what to do next

## 2022-09-14 NOTE — Telephone Encounter (Signed)
Prior authorization effective dates for Roflumilast 242mg are:  09-14-2022 to 09-14-2023

## 2022-09-14 NOTE — Progress Notes (Signed)
_0  ID: Robert Church, male    DOB: 25-May-1956, 66 y.o.   MRN: [id number]  Chief Complaint  Patient presents with   Follow-up    Referring provider: Janith Lima, MD  HPI: 66 year old male former smoker followed for severe COPD with bronchitic phenotype and chronic respiratory failure on oxygen.  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  Patient has underlying COPD.  He remains on [place] Spiriva.  He takes Daliresp and prednisone 10 mg daily patient complains over the last few weeks his breathing has not been as good.  His weight has been trending up he is up between 3 to 5 pounds.  Is had more puffiness in his ankles.  Patient remains on torsemide 20 mg daily.  Patient endorses compliance on most days says if he has to travel he does not take it and definitely will gain weight.  Patient is followed by cardiology for congestive heart failure. He remains on oxygen 2 L with activity says he does not have to use it very often. He denies any fever, hemoptysis, chest pain, orthopnea or calf pain.  No discolored mucus. Says it is hard to get up the mucus that time.  Feels like it gets stuck in his airways especially worse first thing in the morning  Patient has sleep apnea and is on nocturnal trilogy device .  Patient says he  cannot sleep without it.  Feels that he benefits from Trelegy with decreased daytime sleepiness and improved breathing.  Download shows excellent compliance with 100% usage.  Daily average usage at 8.5 hours  Goes on Medicare in November 1. Will need appeal letter  for [employer] new insurance.  Also may need to change his Dulera to Advair as it is better covered but he will let [place]  He says he does remain active despite being very short of breath.  He mows several yards on a riding lawn more.  He says he does try to wear a mask.  He does feel like it aggravates his breathing but he does not want to give it up because it helps him to remain active.  We have set patient up for pulmonary function testing last visit patient says he absolutely cannot do this and declines doing this going forward.  Says it is too hard on him.  Declines flu shot or RSV or Covid vaccine .       Remains Singulair and Claritin daily .  Allergies  Allergen Reactions   Spironolactone Anaphylaxis   Daliresp [Roflumilast] Other (See Comments)    Dizzy, headache, leg pain per patient. 02/25/22.   Need to take in smaller doses    Jardiance [Empagliflozin] Nausea Only and Other (See Comments)    Lightheadness Dizziness   Penicillins Swelling and Other (See Comments)    Childhood rxn--MD  stated he "almost died" Has patient had a PCN reaction causing immediate rash, facial/tongue/throat swelling, SOB or lightheadedness with hypotension:Yes Has patient had a PCN reaction causing severe rash involving mucus membranes or skin necrosis:unsure Has patient had a PCN reaction that required hospitalization:unsure Has patient had a PCN reaction occurring within the last 10 years:No If all of the above answers are "NO", then may proceed with Cephalosporin use.     Clindamycin Other (See Comments)    Tongue swelling   Doxycycline Nausea And Vomiting    Severe stomach upset per patient   E-Mycin [Erythromycin Base]  Swelling   Rosuvastatin Other (See Comments)    Myalgias (intolerance)    Immunization History  Administered Date(s) Administered   Influenza,inj,Quad PF,6+ Mos 09/17/2021   PNEUMOCOCCAL CONJUGATE-20 08/20/2021   Tdap 12/03/2011    Past Medical History:  Diagnosis Date   Adenomatous colon polyp    Allergy    Anal fissure    Arthritis    Asthma    Bronchitis    CHF (congestive heart failure) (HCC)    COPD, group D, by GOLD 2017 classification ([place])    Dyspnea    Emphysema lung (HCC)    GERD (gastroesophageal reflux disease)    History of hiatal hernia    Hyperlipidemia    Hypertension    NICM (nonischemic cardiomyopathy) (HCC)    OSA (obstructive sleep apnea)    Pneumonia    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   Requires supplemental oxygen    Sinusitis    SOB (shortness of breath)     Tobacco History: Social History   Tobacco Use  Smoking Status Former   Packs/day: 2.00   Years: 46.00   Total pack years: 92.00   Types: Cigarettes   Quit date: 01/27/2022   Years since quitting: 0.6   Passive exposure: Past  Smokeless Tobacco Never  Tobacco Comments   Had 3 cigarettes in past 2 weeks hfb 07/03/2022   Counseling given: Not Answered Tobacco comments: Had 3 cigarettes in past 2 weeks hfb 07/03/2022   Outpatient Medications Prior to Visit  Medication Sig Dispense Refill   albuterol (PROVENTIL) (2.5 MG/3ML) 0.083% nebulizer solution Take 3 mLs (2.5 mg total) by nebulization every 4 (four) hours as needed for wheezing or shortness of breath. 75 mL 11   albuterol (VENTOLIN HFA) 108 (90 Base) MCG/ACT inhaler INHALE 2 PUFFS INTO THE LUNGS EVERY 6 HOURS AS NEEDED FOR WHEEZING 18 g 4   allopurinol (ZYLOPRIM) 300 MG tablet TAKE 1 TABLET(300 MG) BY MOUTH DAILY (Patient taking differently: as needed.) 90 tablet 0   ALPRAZolam (XANAX) 0.5 MG tablet Take 1 tablet (0.5 mg total) by mouth 3 (three) times daily as needed for anxiety. 270 tablet 0   AMBULATORY NON  FORMULARY MEDICATION Medication Name: Nitroglycerin gel 0.125% apply 2-3 times daily to the anal canal. (Patient taking differently: Place 1 application  rectally in the morning, at noon, and at bedtime. Medication Name: Nitroglycerin gel 0.125% apply 2-3 times daily to the anal canal.) 30 g 1   cholecalciferol (VITAMIN D3) 25 MCG (1000 UNIT) tablet Take 1,000 Units by mouth daily.     colchicine 0.6 MG tablet Take 0.6 mg by mouth daily as needed (gout pain).     EPINEPHrine 0.3 mg/0.3 mL IJ SOAJ injection Inject 0.3 mg into the muscle as needed for anaphylaxis. 1 each 0   fluticasone (FLONASE) 50 MCG/ACT nasal spray SHAKE LIQUID AND USE 2 SPRAYS IN  EACH NOSTRIL DAILY (Patient taking differently: Place 2 sprays into both nostrils daily as needed for allergies.) 16 g 2   guaiFENesin (MUCINEX) 600 MG 12 hr tablet Take 1 tablet (600 mg total) by mouth 2 (two) times daily. (Patient taking differently: Take 600 mg by mouth daily.) 60 tablet 5   ipratropium (ATROVENT) 0.06 % nasal spray Place 2 sprays into both nostrils 4 (four) times daily. 15 mL 3   lidocaine (XYLOCAINE) 5 % ointment daily.     Lidocaine, Anorectal, 5 % CREA Apply 1 application topically 2 (two) times daily. 30 g 1   losartan (COZAAR) 25 MG tablet Take 0.5 tablets (12.5 mg total) by mouth daily. 90 tablet 3   mometasone-formoterol (DULERA) 100-5 MCG/ACT AERO Inhale 2 puffs into the lungs 2 (two) times daily. 13 g 5   montelukast (SINGULAIR) 10 MG tablet TAKE 1 TABLET(10 MG) BY MOUTH AT BEDTIME 30 tablet 5   nicotine (NICODERM CQ - DOSED IN MG/24 HOURS) 14 mg/24hr patch Place 1 patch (14 mg total) onto the skin daily. 28 patch 0   omeprazole (PRILOSEC) 20 MG capsule Take 1 capsule (20 mg total) by mouth 2 (two) times daily before a meal. ACID REFLUX 180 capsule 1   polyethylene glycol (MIRALAX / GLYCOLAX) 17 g packet Take 17 g by mouth daily as needed. 14 each 0   potassium chloride SA (KLOR-CON M20) 20 MEQ tablet Take 1 tablet (20 mEq  total) by mouth daily. (Patient taking differently: Take 20 mEq by mouth every other day.) 90 tablet 3   predniSONE (DELTASONE) 10 MG tablet TAKE 1 TABLET(10 MG) BY MOUTH DAILY WITH BREAKFAST 30 tablet 5   Roflumilast (DALIRESP) 250 MCG TABS Take 1 tablet by mouth daily. (Patient taking differently: Take 1 tablet by mouth in the morning and at bedtime.) 30 tablet 11   Testosterone 20.25 MG/1.25GM (1.62%) GEL Place 1.25 g onto the skin daily. 112.5 g 0   Tiotropium Bromide Monohydrate (SPIRIVA RESPIMAT) 2.5 MCG/ACT AERS Inhale 2 puffs into the lungs daily. 4 g 4   torsemide (DEMADEX) 20 MG tablet Take 20 mg by mouth daily.     traMADol (ULTRAM) 50 MG tablet Take 1 tablet (50 mg total) by mouth every 8 (eight) hours as needed. 90 tablet 2   vitamin B-12 (CYANOCOBALAMIN) 1000 MCG tablet Take 1,000 mcg by mouth daily.     No facility-administered medications prior to visit.     Review of Systems:   Constitutional:   No  weight loss, night sweats,  Fevers, chills, + fatigue, or  lassitude.  HEENT:   No headaches,  Difficulty swallowing,  Tooth/dental problems, or  Sore throat,                No sneezing, itching, ear ache, nasal congestion, post nasal drip,   CV:  No chest pain,  Orthopnea, PND, swelling in lower extremities, anasarca, dizziness, palpitations, syncope.   GI  No heartburn, indigestion, abdominal pain, nausea, vomiting, diarrhea, change in bowel habits, loss of appetite, bloody stools.   Resp:  No chest wall deformity  Skin: no rash or lesions.  GU: no dysuria, change in color of urine, no urgency or frequency.  No flank pain, no hematuria   MS:  No joint pain or swelling.  No decreased range of motion.  No back pain.    Physical Exam  BP 128/80 (BP Location: Left Arm, Patient Position: Sitting, Cuff Size: Normal)   Pulse 88   Temp 97.8  F (36.6 C) (Oral)   Ht _0  (1.778 m)   Wt 233 lb 3.2 oz (105.8 kg)   SpO2 94% Comment: RA  BMI 33.46 kg/m   GEN: A/Ox3;  pleasant , NAD, chronically ill-appearing   HEENT:  [place]/AT,  NOSE-clear, THROAT-clear, no lesions, no postnasal drip or exudate noted.   NECK:  Supple w/ fair ROM; no JVD; normal carotid impulses w/o bruits; no thyromegaly or nodules palpated; no lymphadenopathy.    RESP few scattered rhonchi no accessory muscle use, no dullness to percussion  CARD:  RRR, no m/r/g, 1+ peripheral edema, pulses intact, no cyanosis or clubbing.  GI:   Soft & nt; nml bowel sounds; no organomegaly or masses detected.   Musco: Warm bil, no deformities or joint swelling noted.   Neuro: alert, no focal deficits noted.    Skin: Warm, no lesions or rashes    Lab Results:  CBC    Component Value Date/Time   WBC 9.3 06/19/2022 1404   RBC 4.44 06/19/2022 1404   HGB 14.9 06/19/2022 1404   HGB 15.4 09/26/2021 1318   HCT 43.8 06/19/2022 1404   HCT 44.4 09/26/2021 1318   PLT 200 06/19/2022 1404   PLT 215 09/26/2021 1318   MCV 98.6 06/19/2022 1404   MCV 95 09/26/2021 1318   MCH 33.6 06/19/2022 1404   MCHC 34.0 06/19/2022 1404   RDW 12.8 06/19/2022 1404   RDW 12.6 09/26/2021 1318   LYMPHSABS 0.7 06/05/2022 1518   LYMPHSABS 0.7 09/26/2021 1318   MONOABS 0.4 06/05/2022 1518   EOSABS 0.0 06/05/2022 1518   EOSABS 0.0 09/26/2021 1318   BASOSABS 0.0 06/05/2022 1518   BASOSABS 0.0 09/26/2021 1318    BMET    Component Value Date/Time   NA 141 06/19/2022 1404   NA 144 02/16/2022 1220   K 3.8 06/19/2022 1404   CL 103 06/19/2022 1404   CO2 28 06/19/2022 1404   GLUCOSE 174 (H) 06/19/2022 1404   BUN 17 06/19/2022 1404   BUN 15 02/16/2022 1220   CREATININE 1.23 06/19/2022 1404   CREATININE 1.04 07/10/2020 1035   CALCIUM 9.0 06/19/2022 1404   GFRNONAA >60 06/19/2022 1404   GFRAA >60 02/14/2020 1454    BNP    Component Value Date/Time   BNP 159.1 (H) 06/19/2022 1402    ProBNP    Component Value Date/Time   PROBNP 1,090 (H) 02/16/2022 1220   PROBNP 156.0 (H) 04/09/2021 1452     Imaging:   No results found for: "NITRICOXIDE"      Assessment & Plan:   Panlobular emphysema (HCC) COPD with emphysema patient has significant symptom burden.  We will continue on triple therapy maintenance regimen.  Add in flutter valve to help with mucociliary clearance.  Check chest x-ray today.  Hold on antibiotics and steroids at this time.  Plan Patient Instructions  Mucinex DM Twice daily  As needed  cough/congestion  Fluids and rest.  Tylenol As needed   Continue on Dulera 2 puffs Twice daily , rinse after use.  Spiriva daily  Continue Daliresp daily .  Continue on Prednisone  74m daily.   Delsym 2 tsp Twice daily  for cough As needed   Tessalon Three times a day  for cough As needed   Continue on Claritin 158mdaily.  Albuterol inhaler or neb As needed   Continue on Oxygen 2l/m as needed to keep Oxygen Levels >88-90%.  Continue on Trilogy vent /BIPAP At bedtime  with Oxygen 3 l/m  Set up for Overnight oximetry test on Trilogy with Oxygen 3l/m  Add Flutter valve Twice daily   Chest xray and labs today .  Extra torsemide 70m daily for next 3 days (along with potassium)  Follow up with Dr. BLamonte Sakaior Robert Pugh NP in 2 months  and As needed   Please contact office for sooner follow up if symptoms do not improve or worsen or seek emergency care      Chronic combined systolic and diastolic CHF (congestive heart failure) ([place] Appears slightly decompensated with weight gain of 6 to 10 pounds along with ankle edema.  We will check labs with c-Met and BNP.  General diuresis as kidney function will allow.  Take extra torsemide 20 mg daily for the next 3 days.  Plan  Patient Instructions  Mucinex DM Twice daily  As needed  cough/congestion  Fluids and rest.  Tylenol As needed   Continue on Dulera 2 puffs Twice daily , rinse after use.  Spiriva daily  Continue Daliresp daily .  Continue on Prednisone  170mdaily.   Delsym 2 tsp Twice daily  for cough As needed    Tessalon Three times a day  for cough As needed   Continue on Claritin 1055maily.  Albuterol inhaler or neb As needed   Continue on Oxygen 2l/m as needed to keep Oxygen Levels >88-90%.  Continue on Trilogy vent /BIPAP At bedtime  with Oxygen 3 l/m  Set up for Overnight oximetry test on Trilogy with Oxygen 3l/m  Add Flutter valve Twice daily   Chest xray and labs today .  Extra torsemide 29m30mily for next 3 days (along with potassium)  Follow up with Dr. ByruLamonte SakaiParrett NP in 2 months  and As needed   Please contact office for sooner follow up if symptoms do not improve or worsen or seek emergency care      Chronic respiratory failure with hypoxia (HCC)[place] on oxygen with activity and at bedtime to maintain O2 saturations greater than 88 to 9%. Patient remains on oxygen with his noninvasive vent at bedtime.  He is currently on 4 L.  Wants to see if he can tolerate 3 L as he wants to be able to get a portable concentrator for travel.  Smaller portable concentrator will only go up to 3 L continuous We will that up for overnight oximetry test on Trelegy with oxygen at 3 L  Obstructive sleep apnea syndrome Continue on nocturnal trilogy vent with oxygen Download shows excellent compliance     TammRexene Edison 09/14/2022

## 2022-09-14 NOTE — Assessment & Plan Note (Signed)
Appears slightly decompensated with weight gain of 6 to 10 pounds along with ankle edema.  We will check labs with c-Met and BNP.  General diuresis as kidney function will allow.  Take extra torsemide 20 mg daily for the next 3 days.  Plan  Patient Instructions  Mucinex DM Twice daily  As needed  cough/congestion  Fluids and rest.  Tylenol As needed   Continue on Dulera 2 puffs Twice daily , rinse after use.  Spiriva daily  Continue Daliresp daily .  Continue on Prednisone  29m daily.   Delsym 2 tsp Twice daily  for cough As needed   Tessalon Three times a day  for cough As needed   Continue on Claritin 186mdaily.  Albuterol inhaler or neb As needed   Continue on Oxygen 2l/m as needed to keep Oxygen Levels >88-90%.  Continue on Trilogy vent /BIPAP At bedtime  with Oxygen 3 l/m  Set up for Overnight oximetry test on Trilogy with Oxygen 3l/m  Add Flutter valve Twice daily   Chest xray and labs today .  Extra torsemide 2036maily for next 3 days (along with potassium)  Follow up with Dr. ByrLamonte Sakai Oni Dietzman NP in 2 months  and As needed   Please contact office for sooner follow up if symptoms do not improve or worsen or seek emergency care

## 2022-09-15 ENCOUNTER — Encounter: Payer: Self-pay | Admitting: Internal Medicine

## 2022-09-15 ENCOUNTER — Ambulatory Visit (INDEPENDENT_AMBULATORY_CARE_PROVIDER_SITE_OTHER): Payer: 59 | Admitting: Internal Medicine

## 2022-09-15 VITALS — BP 122/76 | HR 87 | Temp 98.0°F | Resp 16 | Ht 70.0 in | Wt 234.0 lb

## 2022-09-15 DIAGNOSIS — I10 Essential (primary) hypertension: Secondary | ICD-10-CM

## 2022-09-15 DIAGNOSIS — E785 Hyperlipidemia, unspecified: Secondary | ICD-10-CM

## 2022-09-15 DIAGNOSIS — M1A0790 Idiopathic chronic gout, unspecified ankle and foot, without tophus (tophi): Secondary | ICD-10-CM

## 2022-09-15 DIAGNOSIS — Z0001 Encounter for general adult medical examination with abnormal findings: Secondary | ICD-10-CM | POA: Diagnosis not present

## 2022-09-15 DIAGNOSIS — M47816 Spondylosis without myelopathy or radiculopathy, lumbar region: Secondary | ICD-10-CM | POA: Diagnosis not present

## 2022-09-15 DIAGNOSIS — E118 Type 2 diabetes mellitus with unspecified complications: Secondary | ICD-10-CM

## 2022-09-15 DIAGNOSIS — Z1211 Encounter for screening for malignant neoplasm of colon: Secondary | ICD-10-CM

## 2022-09-15 LAB — URINALYSIS, ROUTINE W REFLEX MICROSCOPIC
Bilirubin Urine: NEGATIVE
Hgb urine dipstick: NEGATIVE
Ketones, ur: NEGATIVE
Leukocytes,Ua: NEGATIVE
Nitrite: NEGATIVE
RBC / HPF: NONE SEEN (ref 0–?)
Specific Gravity, Urine: 1.01 (ref 1.000–1.030)
Total Protein, Urine: NEGATIVE
Urine Glucose: NEGATIVE
Urobilinogen, UA: 0.2 (ref 0.0–1.0)
pH: 6 (ref 5.0–8.0)

## 2022-09-15 LAB — MICROALBUMIN / CREATININE URINE RATIO
Creatinine,U: 61 mg/dL
Microalb Creat Ratio: 1.1 mg/g (ref 0.0–30.0)
Microalb, Ur: <0.7 mg/dL (ref 0.0–1.9)

## 2022-09-15 LAB — BASIC METABOLIC PANEL
BUN: 18 mg/dL (ref 6–23)
CO2: 31 mEq/L (ref 19–32)
Calcium: 9 mg/dL (ref 8.4–10.5)
Chloride: 99 mEq/L (ref 96–112)
Creatinine, Ser: 1.07 mg/dL (ref 0.40–1.50)
GFR: 73.56 mL/min (ref >60.00)
Glucose, Bld: 166 mg/dL — ABNORMAL HIGH (ref 70–99)
Potassium: 4.3 mEq/L (ref 3.5–5.1)
Sodium: 139 mEq/L (ref 135–145)

## 2022-09-15 LAB — LIPID PANEL
Cholesterol: 222 mg/dL — ABNORMAL HIGH (ref 0–200)
HDL: 51.9 mg/dL (ref >39.00)
NonHDL: 170.55
Total CHOL/HDL Ratio: 4
Triglycerides: 257 mg/dL — ABNORMAL HIGH (ref 0.0–149.0)
VLDL: 51.4 mg/dL — ABNORMAL HIGH (ref 0.0–40.0)

## 2022-09-15 LAB — BRAIN NATRIURETIC PEPTIDE: Pro B Natriuretic peptide (BNP): 167 pg/mL — ABNORMAL HIGH (ref 0.0–100.0)

## 2022-09-15 LAB — URIC ACID: Uric Acid, Serum: 11 mg/dL — ABNORMAL HIGH (ref 4.0–7.8)

## 2022-09-15 LAB — LDL CHOLESTEROL, DIRECT: Direct LDL: 157 mg/dL

## 2022-09-15 LAB — HEMOGLOBIN A1C: Hgb A1c MFr Bld: 6.6 % — ABNORMAL HIGH (ref 4.6–6.5)

## 2022-09-15 MED ORDER — ALLOPURINOL 300 MG PO TABS: 300.0000 mg | ORAL_TABLET | Freq: Every day | ORAL | 0 refills | Status: DC

## 2022-09-15 MED ORDER — LIDOCAINE 5 % EX OINT: TOPICAL_OINTMENT | Freq: Three times a day (TID) | CUTANEOUS | 1 refills | Status: DC | PRN

## 2022-09-15 MED ORDER — PITAVASTATIN CALCIUM 2 MG PO TABS: 2.0000 mg | ORAL_TABLET | Freq: Every day | ORAL | 1 refills | Status: DC

## 2022-09-15 NOTE — Patient Instructions (Signed)
Health Maintenance, Male Adopting a healthy lifestyle and getting preventive care are important in promoting health and wellness. Ask your health care provider about: The right schedule for you to have regular tests and exams. Things you can do on your own to prevent diseases and keep yourself healthy. What should I know about diet, weight, and exercise? Eat a healthy diet  Eat a diet that includes plenty of vegetables, fruits, low-fat dairy products, and lean protein. Do not eat a lot of foods that are high in solid fats, added sugars, or sodium. Maintain a healthy weight Body mass index (BMI) is a measurement that can be used to identify possible weight problems. It estimates body fat based on height and weight. Your health care provider can help determine your BMI and help you achieve or maintain a healthy weight. Get regular exercise Get regular exercise. This is one of the most important things you can do for your health. Most adults should: Exercise for at least 150 minutes each week. The exercise should increase your heart rate and make you sweat (moderate-intensity exercise). Do strengthening exercises at least twice a week. This is in addition to the moderate-intensity exercise. Spend less time sitting. Even light physical activity can be beneficial. Watch cholesterol and blood lipids Have your blood tested for lipids and cholesterol at 64 years of age, then have this test every 5 years. You may need to have your cholesterol levels checked more often if: Your lipid or cholesterol levels are high. You are older than 64 years of age. You are at high risk for heart disease. What should I know about cancer screening? Many types of cancers can be detected early and may often be prevented. Depending on your health history and family history, you may need to have cancer screening at various ages. This may include screening for: Colorectal cancer. Prostate cancer. Skin cancer. Lung  cancer. What should I know about heart disease, diabetes, and high blood pressure? Blood pressure and heart disease High blood pressure causes heart disease and increases the risk of stroke. This is more likely to develop in people who have high blood pressure readings or are overweight. Talk with your health care provider about your target blood pressure readings. Have your blood pressure checked: Every 3-5 years if you are 18-39 years of age. Every year if you are 40 years old or older. If you are between the ages of 65 and 75 and are a current or former smoker, ask your health care provider if you should have a one-time screening for abdominal aortic aneurysm (AAA). Diabetes Have regular diabetes screenings. This checks your fasting blood sugar level. Have the screening done: Once every three years after age 45 if you are at a normal weight and have a low risk for diabetes. More often and at a younger age if you are overweight or have a high risk for diabetes. What should I know about preventing infection? Hepatitis B If you have a higher risk for hepatitis B, you should be screened for this virus. Talk with your health care provider to find out if you are at risk for hepatitis B infection. Hepatitis C Blood testing is recommended for: Everyone born from 1945 through 1965. Anyone with known risk factors for hepatitis C. Sexually transmitted infections (STIs) You should be screened each year for STIs, including gonorrhea and chlamydia, if: You are sexually active and are younger than 64 years of age. You are older than 64 years of age and your   health care provider tells you that you are at risk for this type of infection. Your sexual activity has changed since you were last screened, and you are at increased risk for chlamydia or gonorrhea. Ask your health care provider if you are at risk. Ask your health care provider about whether you are at high risk for HIV. Your health care provider  may recommend a prescription medicine to help prevent HIV infection. If you choose to take medicine to prevent HIV, you should first get tested for HIV. You should then be tested every 3 months for as long as you are taking the medicine. Follow these instructions at home: Alcohol use Do not drink alcohol if your health care provider tells you not to drink. If you drink alcohol: Limit how much you have to 0-2 drinks a day. Know how much alcohol is in your drink. In the [place] one drink equals one 12 oz bottle of beer (355 mL), one 5 oz glass of wine (148 mL), or one 1 oz glass of hard liquor (44 mL). Lifestyle Do not use any products that contain nicotine or tobacco. These products include cigarettes, chewing tobacco, and vaping devices, such as e-cigarettes. If you need help quitting, ask your health care provider. Do not use street drugs. Do not share needles. Ask your health care provider for help if you need support or information about quitting drugs. General instructions Schedule regular health, dental, and eye exams. Stay current with your vaccines. Tell your health care provider if: You often feel depressed. You have ever been abused or do not feel safe at home. Summary Adopting a healthy lifestyle and getting preventive care are important in promoting health and wellness. Follow your health care provider's instructions about healthy diet, exercising, and getting tested or screened for diseases. Follow your health care provider's instructions on monitoring your cholesterol and blood pressure. This information is not intended to replace advice given to you by your health care provider. Make sure you discuss any questions you have with your health care provider. Document Revised: 04/28/2021 Document Reviewed: 04/28/2021 Elsevier Patient Education  2023 [employer]  

## 2022-09-15 NOTE — Telephone Encounter (Signed)
The patient called stating he called Robert Church yesterday but did not get a call back. I told him that someone will call him back.

## 2022-09-15 NOTE — Telephone Encounter (Signed)
I called pt and gave him your recommendations. He stated that he is very short of breath and fatigued. He said he has put on some weight and that could be contributing to it but he's trying to lose weight.  He doesn't really want to wait a month or 2 but said if you didn't want to adjust it again yet that he would wait.

## 2022-09-15 NOTE — Progress Notes (Signed)
Subjective:  Patient ID: Robert Church, male    DOB: 03-21-1956  Age: 66 y.o. MRN: [id number]  CC: Annual Exam, Hyperlipidemia, and Diabetes   HPI Robert Church presents for a CPX and f/up -   He is active and denies chest pain, shortness of breath, diaphoresis, or edema.  Outpatient Medications Prior to Visit  Medication Sig Dispense Refill   albuterol (PROVENTIL) (2.5 MG/3ML) 0.083% nebulizer solution Take 3 mLs (2.5 mg total) by nebulization every 4 (four) hours as needed for wheezing or shortness of breath. 75 mL 11   albuterol (VENTOLIN HFA) 108 (90 Base) MCG/ACT inhaler INHALE 2 PUFFS INTO THE LUNGS EVERY 6 HOURS AS NEEDED FOR WHEEZING 18 g 4   ALPRAZolam (XANAX) 0.5 MG tablet Take 1 tablet (0.5 mg total) by mouth 3 (three) times daily as needed for anxiety. 270 tablet 0   AMBULATORY NON FORMULARY MEDICATION Medication Name: Nitroglycerin gel 0.125% apply 2-3 times daily to the anal canal. (Patient taking differently: Place 1 application  rectally in the morning, at noon, and at bedtime. Medication Name: Nitroglycerin gel 0.125% apply 2-3 times daily to the anal canal.) 30 g 1   cholecalciferol (VITAMIN D3) 25 MCG (1000 UNIT) tablet Take 1,000 Units by mouth daily.     colchicine 0.6 MG tablet Take 0.6 mg by mouth daily as needed (gout pain).     EPINEPHrine 0.3 mg/0.3 mL IJ SOAJ injection Inject 0.3 mg into the muscle as needed for anaphylaxis. 1 each 0   fluticasone (FLONASE) 50 MCG/ACT nasal spray SHAKE LIQUID AND USE 2 SPRAYS IN EACH NOSTRIL DAILY (Patient taking differently: Place 2 sprays into both nostrils daily as needed for allergies.) 16 g 2   guaiFENesin (MUCINEX) 600 MG 12 hr tablet Take 1 tablet (600 mg total) by mouth 2 (two) times daily. (Patient taking differently: Take 600 mg by mouth daily.) 60 tablet 5   ipratropium (ATROVENT) 0.06 % nasal spray Place 2 sprays into both nostrils 4 (four) times daily. 15 mL 3   Lidocaine, Anorectal, 5 % CREA Apply 1 application  topically 2 (two) times daily. 30 g 1   losartan (COZAAR) 25 MG tablet Take 0.5 tablets (12.5 mg total) by mouth daily. 90 tablet 3   mometasone-formoterol (DULERA) 100-5 MCG/ACT AERO Inhale 2 puffs into the lungs 2 (two) times daily. 13 g 5   montelukast (SINGULAIR) 10 MG tablet TAKE 1 TABLET(10 MG) BY MOUTH AT BEDTIME 30 tablet 5   nicotine (NICODERM CQ - DOSED IN MG/24 HOURS) 14 mg/24hr patch Place 1 patch (14 mg total) onto the skin daily. 28 patch 0   omeprazole (PRILOSEC) 20 MG capsule Take 1 capsule (20 mg total) by mouth 2 (two) times daily before a meal. ACID REFLUX 180 capsule 1   polyethylene glycol (MIRALAX / GLYCOLAX) 17 g packet Take 17 g by mouth daily as needed. 14 each 0   potassium chloride SA (KLOR-CON M20) 20 MEQ tablet Take 1 tablet (20 mEq total) by mouth daily. (Patient taking differently: Take 20 mEq by mouth every other day.) 90 tablet 3   predniSONE (DELTASONE) 10 MG tablet TAKE 1 TABLET(10 MG) BY MOUTH DAILY WITH BREAKFAST 30 tablet 5   Roflumilast (DALIRESP) 250 MCG TABS Take 1 tablet by mouth daily. (Patient taking differently: Take 1 tablet by mouth in the morning and at bedtime.) 30 tablet 11   Testosterone 20.25 MG/1.25GM (1.62%) GEL Place 1.25 g onto the skin daily. 112.5 g 0   Tiotropium  Bromide Monohydrate (SPIRIVA RESPIMAT) 2.5 MCG/ACT AERS Inhale 2 puffs into the lungs daily. 4 g 4   torsemide (DEMADEX) 20 MG tablet Take 20 mg by mouth daily.     traMADol (ULTRAM) 50 MG tablet Take 1 tablet (50 mg total) by mouth every 8 (eight) hours as needed. 90 tablet 2   vitamin B-12 (CYANOCOBALAMIN) 1000 MCG tablet Take 1,000 mcg by mouth daily.     allopurinol (ZYLOPRIM) 300 MG tablet TAKE 1 TABLET(300 MG) BY MOUTH DAILY (Patient taking differently: as needed.) 90 tablet 0   lidocaine (XYLOCAINE) 5 % ointment daily.     No facility-administered medications prior to visit.    ROS Review of Systems  Constitutional: Negative.  Negative for diaphoresis and fatigue.   HENT: Negative.    Eyes: Negative.   Respiratory:  Negative for cough, chest tightness, shortness of breath and wheezing.   Cardiovascular:  Negative for chest pain, palpitations and leg swelling.  Gastrointestinal:  Negative for abdominal pain, constipation, diarrhea, nausea and vomiting.  Endocrine: Negative.   Genitourinary: Negative.  Negative for difficulty urinating.  Musculoskeletal:  Positive for back pain. Negative for arthralgias and myalgias.  Skin: Negative.   Neurological:  Negative for dizziness, weakness and light-headedness.  Hematological:  Negative for adenopathy. Does not bruise/bleed easily.  Psychiatric/Behavioral: Negative.      Objective:  BP 122/76 (BP Location: Left Arm, Patient Position: Sitting, Cuff Size: Large)   Pulse 87   Temp 98 F (36.7 C) (Oral)   Resp 16   Ht '5\' 10"'$  (1.778 m)   Wt 234 lb (106.1 kg)   SpO2 91%   BMI 33.58 kg/m   BP Readings from Last 3 Encounters:  09/15/22 122/76  09/14/22 128/80  09/01/22 116/72    Wt Readings from Last 3 Encounters:  09/15/22 234 lb (106.1 kg)  09/14/22 233 lb 3.2 oz (105.8 kg)  09/01/22 226 lb 12.8 oz (102.9 kg)    Physical Exam Vitals reviewed.  Constitutional:      Appearance: He is obese.  HENT:     Mouth/Throat:     Mouth: Mucous membranes are moist.  Eyes:     General: No scleral icterus.    Conjunctiva/sclera: Conjunctivae normal.  Cardiovascular:     Rate and Rhythm: Normal rate and regular rhythm.     Heart sounds: No murmur heard.    No gallop.  Pulmonary:     Effort: Pulmonary effort is normal.     Breath sounds: No stridor. No wheezing, rhonchi or rales.  Abdominal:     General: Abdomen is protuberant. Bowel sounds are normal. There is no distension.     Palpations: Abdomen is soft. There is no hepatomegaly, splenomegaly or mass.     Tenderness: There is no abdominal tenderness. There is no guarding.  Musculoskeletal:        General: Normal range of motion.     Cervical  back: Neck supple.     Right lower leg: No edema.     Left lower leg: No edema.  Lymphadenopathy:     Cervical: No cervical adenopathy.  Skin:    General: Skin is warm and dry.  Neurological:     General: No focal deficit present.     Mental Status: He is alert.  Psychiatric:        Mood and Affect: Mood normal.        Behavior: Behavior normal.     Lab Results  Component Value Date   WBC 9.3  06/19/2022   HGB 14.9 06/19/2022   HCT 43.8 06/19/2022   PLT 200 06/19/2022   GLUCOSE 166 (H) 09/14/2022   CHOL 222 (H) 09/15/2022   TRIG 257.0 (H) 09/15/2022   HDL 51.90 09/15/2022   LDLDIRECT 157.0 09/15/2022   LDLCALC 117 (H) 10/20/2021   ALT 19 06/05/2022   AST 16 06/05/2022   NA 139 09/14/2022   K 4.3 09/14/2022   CL 99 09/14/2022   CREATININE 1.07 09/14/2022   BUN 18 09/14/2022   CO2 31 09/14/2022   TSH 1.147 04/16/2021   PSA 3.24 06/29/2022   INR 0.9 01/29/2022   HGBA1C 6.6 (H) 09/15/2022   MICROALBUR <0.7 09/15/2022    CT HEAD WO CONTRAST (5MM)  Result Date: 06/19/2022 CLINICAL DATA:  Dizziness, persistent/recurrent, cardiac or vascular cause suspected Headache, new or worsening (Age >= 50y) Syncope/presyncope, cerebrovascular cause suspected EXAM: CT HEAD WITHOUT CONTRAST TECHNIQUE: Contiguous axial images were obtained from the base of the skull through the vertex without intravenous contrast. RADIATION DOSE REDUCTION: This exam was performed according to the departmental dose-optimization program which includes automated exposure control, adjustment of the mA and/or kV according to patient size and/or use of iterative reconstruction technique. COMPARISON:  01/12/2019 FINDINGS: Brain: No evidence of acute infarction, hemorrhage, hydrocephalus, extra-axial collection or mass lesion/mass effect. Vascular: No hyperdense vessel or unexpected calcification. Skull: Normal. Negative for fracture or focal lesion. Sinuses/Orbits: Debris in the left maxillary sinus with layering  air-fluid level. Paranasal sinuses and mastoid air cells are otherwise clear. Other: None. IMPRESSION: 1. No acute intracranial abnormality. 2. Debris in the left maxillary sinus with air-fluid level. Correlate for acute sinusitis. Electronically Signed   By: Davina Poke D.O.   On: 06/19/2022 15:21    Assessment & Plan:   Lawson was seen today for annual exam, hyperlipidemia and diabetes.  Diagnoses and all orders for this visit:  Primary hypertension- His blood pressure is adequately well controlled. -     Urinalysis, Routine w reflex microscopic; Future -     Urinalysis, Routine w reflex microscopic  Type II diabetes mellitus with manifestations ([place])- His blood sugar is adequately well controlled. -     Microalbumin / creatinine urine ratio; Future -     Hemoglobin A1c; Future -     Hemoglobin A1c -     Microalbumin / creatinine urine ratio -     Ambulatory referral to Ophthalmology  Idiopathic chronic gout of foot without tophus, unspecified laterality- His uric acid level remains high.  I have encouraged him to be compliant with allopurinol. -     Uric acid; Future -     Uric acid -     allopurinol (ZYLOPRIM) 300 MG tablet; Take 1 tablet (300 mg total) by mouth daily.  Hyperlipidemia LDL goal <70- I have asked him to take a statin for cardiovascular risk reduction. -     Lipid panel; Future -     Lipid panel -     Pitavastatin Calcium 2 MG TABS; Take 1 tablet (2 mg total) by mouth daily.  Lumbar spondylosis -     lidocaine (XYLOCAINE) 5 % ointment; Apply topically 3 (three) times daily as needed.  Encounter for general adult medical examination with abnormal findings- Exam completed, labs reviewed, vaccines reviewed-he refused an influenza vaccine, shingles vaccine, and tetanus booster, cancer screenings addressed, patient education was given.  Screen for colon cancer -     Cologuard  Other orders -     LDL cholesterol, direct  I have changed Olene Craven. Amodei's  lidocaine and allopurinol. I am also having him start on Pitavastatin Calcium. Additionally, I am having him maintain his polyethylene glycol, albuterol, potassium chloride SA, ipratropium, guaiFENesin, Lidocaine (Anorectal), AMBULATORY NON FORMULARY MEDICATION, cholecalciferol, cyanocobalamin, Spiriva Respimat, traMADol, colchicine, Roflumilast, omeprazole, nicotine, predniSONE, montelukast, torsemide, fluticasone, Testosterone, EPINEPHrine, ALPRAZolam, albuterol, losartan, and mometasone-formoterol.  Meds ordered this encounter  Medications   lidocaine (XYLOCAINE) 5 % ointment    Sig: Apply topically 3 (three) times daily as needed.    Dispense:  35.44 g    Refill:  1   allopurinol (ZYLOPRIM) 300 MG tablet    Sig: Take 1 tablet (300 mg total) by mouth daily.    Dispense:  90 tablet    Refill:  0   Pitavastatin Calcium 2 MG TABS    Sig: Take 1 tablet (2 mg total) by mouth daily.    Dispense:  90 tablet    Refill:  1     Follow-up: Return in about 6 months (around 03/16/2023).  Scarlette Calico, MD

## 2022-09-16 ENCOUNTER — Telehealth: Payer: Self-pay | Admitting: Student

## 2022-09-16 NOTE — Telephone Encounter (Signed)
Patient was returning phone call. Please call back 

## 2022-09-16 NOTE — Telephone Encounter (Signed)
LM with detailed recommendations.

## 2022-09-18 ENCOUNTER — Telehealth: Payer: Self-pay | Admitting: Adult Health

## 2022-09-18 NOTE — Telephone Encounter (Signed)
Called and spoke with patient and provided PA information.  He said it was ready at the pharmacy and he would try it again, he had tried it before and it did not help, but he was wiling to try it again.  He asked about the results of the ONO, he did the test on 9/28 on 3L as he was advised.  I let him know it could take a week or so to get the results, however, we would call him when we have the results.  He verbalized understanding.  Nothing further needed.

## 2022-09-18 NOTE — Telephone Encounter (Signed)
Can go back up to 4l/m with [facility] until we get ONO results back

## 2022-09-18 NOTE — Telephone Encounter (Signed)
Pt called office stating that he did ONO overnight 9/28 and has returned the device back to DME. Stated to pt that no report has been received yet since he just did it.  Pt wants to know if he should still remain on 3L O2 which he was told to do while doing the ONO or if he should go ahead and go back up to 4L until we have the results back from the ONO to determine what needs to be done officially.  Tammy, please advise on this.

## 2022-09-21 NOTE — Telephone Encounter (Signed)
Pt called to discuss recommendations. He doesn't feel like his Pulmonologist is trying to help him so he is going to call and get scheduled with one in [place] that he used to see.  He stated that the past week he has been having some mild chest pain with exertion. This has happened a few times and he will take an Aspirin '81mg'$  and it seems to help. I advised him to call and get an appt with Dr. Gwenlyn Found or an APP in his office to be checked out. He stated that he would do that but also would like Andy's recommendation since he manages his [facility].

## 2022-09-21 NOTE — Telephone Encounter (Signed)
See 9/25 phone note.

## 2022-09-21 NOTE — Telephone Encounter (Signed)
Pt is scheduled to see Coletta Memos, NP on 10/9. He was instructed on precautions on when to go to the ED.

## 2022-09-21 NOTE — Telephone Encounter (Signed)
ATC patient. Per DPR called and spoke with patients mother [name]. Advised her that [place] is okay to go back to 4l on hos O2 until we get ONO results back.   Nothing further needed.

## 2022-09-22 NOTE — Progress Notes (Unsigned)
Cardiology Clinic Note   Patient Name: Robert Church Date of Encounter: 09/28/2022  Primary Care Provider:  Janith Lima, MD Primary Cardiologist:  Quay Burow, MD  Patient Profile    Robert Church 66 year old male presents to the clinic today for follow-up evaluation of his nonischemic cardiomyopathy and hypertension.  Past Medical History    Past Medical History:  Diagnosis Date   Adenomatous colon polyp    Allergy    Anal fissure    Arthritis    Asthma    Bronchitis    CHF (congestive heart failure) (HCC)    COPD, group D, by GOLD 2017 classification ([place])    Dyspnea    Emphysema lung (HCC)    GERD (gastroesophageal reflux disease)    History of hiatal hernia    Hyperlipidemia    Hypertension    NICM (nonischemic cardiomyopathy) (HCC)    OSA (obstructive sleep apnea)    Pneumonia    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   Requires supplemental oxygen    Sinusitis    SOB (shortness of breath)    Past Surgical History:  Procedure Laterality Date   BIV UPGRADE N/A 10/07/2021   Procedure: BIV PPM UPGRADE;  Surgeon: Constance Haw, MD;  Location: [place] [facility];  Service: Cardiovascular;  Laterality: N/A;   LEFT HEART CATH AND CORONARY ANGIOGRAPHY N/A 04/19/2017   Procedure: Left Heart Cath and Coronary Angiography;  Surgeon: Belva Crome, MD;  Location: [place] [facility];  Service: Cardiovascular;  Laterality: N/A;   PACEMAKER IMPLANT N/A 04/16/2021   Procedure: PACEMAKER IMPLANT;  Surgeon: Constance Haw, MD;  Location: [place] [facility];  Service: Cardiovascular;  Laterality: N/A;   RIGHT/LEFT HEART CATH AND CORONARY ANGIOGRAPHY N/A 08/22/2021   Procedure: RIGHT/LEFT HEART CATH AND CORONARY ANGIOGRAPHY;  Surgeon: Jolaine Artist, MD;  Location: [place] [facility];  Service: Cardiovascular;  Laterality: N/A;   TONSILLECTOMY      Allergies  Allergies  Allergen Reactions   Spironolactone Anaphylaxis   Daliresp  [Roflumilast] Other (See Comments)    Dizzy, headache, leg pain per patient. 02/25/22.   Need to take in smaller doses    Jardiance [Empagliflozin] Nausea Only and Other (See Comments)    Lightheadness Dizziness   Penicillins Swelling and Other (See Comments)    Childhood rxn--MD stated he "almost died" Has patient had a PCN reaction causing immediate rash, facial/tongue/throat swelling, SOB or lightheadedness with hypotension:Yes Has patient had a PCN reaction causing severe rash involving mucus membranes or skin necrosis:unsure Has patient had a PCN reaction that required hospitalization:unsure Has patient had a PCN reaction occurring within the last 10 years:No If all of the above answers are "NO", then may proceed with Cephalosporin use.     Clindamycin Other (See Comments)    Tongue swelling   Doxycycline Nausea And Vomiting    Severe stomach upset per patient   E-Mycin [Erythromycin Base] Swelling   Rosuvastatin Other (See Comments)    Myalgias (intolerance)    History of Present Illness    Robert Church has a PMH of HTN, NICM, chronic combined systolic and diastolic CHF, aortic atherosclerosis, carotid artery calcification, third-degree AV block, asthma, GERD, type 2 diabetes, tobacco abuse, HLD, and generalized anxiety disorder.  Underwent cardiac catheterization 4/18 due to chest pain.  He was noted to have normal coronary anatomy and EF of 40% with global hypokinesis and LVEDP of 19.  His cardiac  CT 9/19 showed a calcium score of 148, LAD 25% stenosis and otherwise normal coronary Arteries.  He was noted to have dilated pulmonary arteries suggesting PAH.  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  He was seen by [facility] 7/19 due to recurrent chest pain.  He underwent nuclear stress testing 8/19 which showed an EF 38% and inferior scar and septal ischemia his echocardiogram at that time showed an EF of 30-35%.  He was seen in follow-up by Dr. Haroldine Laws on  04/09/2022.  He had been admitted with acute on chronic hypoxic respiratory failure secondary to acute COPD and CHF with fluid volume overload 1/22.  He underwent significant IV diuresis.  Admission weight 265 pounds and discharge weight was 210 pounds.  His echocardiogram 12/21 showed an EF of 40-45%.  He followed up in the advanced heart failure clinic.  Jardiance and spironolactone were added to his medication regimen.  His doses were adjusted due to medication intolerance.  He then stopped the medications on his own.  He wore a cardiac event monitor 4/22 due to near syncope.  He was noted to have complete heart block.  He received a Environmental health practitioner dual-chamber pacemaker 04/16/2021.  During his follow-up with advanced heart failure clinic 04/09/2022.  He reported he was feeling okay.  He has been working in his garden some.  He was having to take frequent breaks.  He denied chest pain and lower extremity swelling.  He denied orthopnea and PND.  He had not needed to take metolazone.  He is not taking losartan.  He continues to smoke 1-2 cigarettes/day.  Plan BiV PPM upgrade 11/10/2022.  He presents to the clinic today for follow-up evaluation and states this weekend while he was getting out of his car he noticed some sharp chest pain on the left side of his chest.  Occasionally over the last few days he has noticed sharp chest pains.  We reviewed his most recent visits with cardiology and the advanced heart failure clinic.  He reported understanding.  His EKG today shows V paced rhythm with significant artifact around 95 bpm.  We reviewed his echocardiogram 8/22.  His chest discomfort is reproducible with deep palpation.  I explained that his pain is musculoskeletal in nature.  He expressed understanding.  We will plan follow-up in 6 to 9 months.  He reports that since he has had pneumonia earlier this year his breathing has not returned to its baseline.  Today he denies increased shortness of breath, lower  extremity edema, fatigue, palpitations, melena, hematuria, hemoptysis, diaphoresis, weakness, presyncope, syncope, orthopnea, and PND.      Home Medications    Prior to Admission medications   Medication Sig Start Date End Date Taking? Authorizing Provider  albuterol (PROVENTIL) (2.5 MG/3ML) 0.083% nebulizer solution Take 3 mLs (2.5 mg total) by nebulization every 4 (four) hours as needed for wheezing or shortness of breath. 08/20/21   Collene Gobble, MD  albuterol (VENTOLIN HFA) 108 (90 Base) MCG/ACT inhaler INHALE 2 PUFFS INTO THE LUNGS EVERY 6 HOURS AS NEEDED FOR WHEEZING 08/10/22   Byrum, Rose Fillers, MD  allopurinol (ZYLOPRIM) 300 MG tablet Take 1 tablet (300 mg total) by mouth daily. 09/15/22   Janith Lima, MD  ALPRAZolam Duanne Moron) 0.5 MG tablet Take 1 tablet (0.5 mg total) by mouth 3 (three) times daily as needed for anxiety. 07/23/22   Janith Lima, MD  AMBULATORY NON FORMULARY MEDICATION Medication Name: Nitroglycerin gel 0.125% apply 2-3 times  daily to the anal canal. Patient taking differently: Place 1 application  rectally in the morning, at noon, and at bedtime. Medication Name: Nitroglycerin gel 0.125% apply 2-3 times daily to the anal canal. 01/08/22   Zehr, Laban Emperor, PA-C  cholecalciferol (VITAMIN D3) 25 MCG (1000 UNIT) tablet Take 1,000 Units by mouth daily.    [provider]  colchicine 0.6 MG tablet Take 0.6 mg by mouth daily as needed (gout pain).    [provider]  EPINEPHrine 0.3 mg/0.3 mL IJ SOAJ injection Inject 0.3 mg into the muscle as needed for anaphylaxis. 07/13/22   Janith Lima, MD  fluticasone (FLONASE) 50 MCG/ACT nasal spray SHAKE LIQUID AND USE 2 SPRAYS IN EACH NOSTRIL DAILY Patient taking differently: Place 2 sprays into both nostrils daily as needed for allergies. 06/29/22   Collene Gobble, MD  guaiFENesin (MUCINEX) 600 MG 12 hr tablet Take 1 tablet (600 mg total) by mouth 2 (two) times daily. Patient taking differently: Take 600 mg by  mouth daily. 12/17/21   Collene Gobble, MD  ipratropium (ATROVENT) 0.06 % nasal spray Place 2 sprays into both nostrils 4 (four) times daily. 12/17/21   Collene Gobble, MD  lidocaine (XYLOCAINE) 5 % ointment Apply topically 3 (three) times daily as needed. 09/15/22   Janith Lima, MD  Lidocaine, Anorectal, 5 % CREA Apply 1 application topically 2 (two) times daily. 01/08/22   Zehr, Laban Emperor, PA-C  losartan (COZAAR) 25 MG tablet Take 0.5 tablets (12.5 mg total) by mouth daily. 09/10/22   Bensimhon, Shaune Pascal, MD  mometasone-formoterol (DULERA) 100-5 MCG/ACT AERO Inhale 2 puffs into the lungs 2 (two) times daily. 09/11/22   Collene Gobble, MD  montelukast (SINGULAIR) 10 MG tablet TAKE 1 TABLET(10 MG) BY MOUTH AT BEDTIME 06/18/22   Byrum, Rose Fillers, MD  nicotine (NICODERM CQ - DOSED IN MG/24 HOURS) 14 mg/24hr patch Place 1 patch (14 mg total) onto the skin daily. 05/22/22   Collene Gobble, MD  omeprazole (PRILOSEC) 20 MG capsule Take 1 capsule (20 mg total) by mouth 2 (two) times daily before a meal. ACID REFLUX 04/27/22   Janith Lima, MD  Pitavastatin Calcium 2 MG TABS Take 1 tablet (2 mg total) by mouth daily. 09/15/22   Janith Lima, MD  polyethylene glycol (MIRALAX / GLYCOLAX) 17 g packet Take 17 g by mouth daily as needed. 12/30/20   Oretha Milch D, MD  potassium chloride SA (KLOR-CON M20) 20 MEQ tablet Take 1 tablet (20 mEq total) by mouth daily. Patient taking differently: Take 20 mEq by mouth every other day. 10/20/21 02/26/23  Bensimhon, Shaune Pascal, MD  predniSONE (DELTASONE) 10 MG tablet TAKE 1 TABLET(10 MG) BY MOUTH DAILY WITH BREAKFAST 06/12/22   Collene Gobble, MD  Roflumilast (DALIRESP) 250 MCG TABS Take 1 tablet by mouth daily. Patient taking differently: Take 1 tablet by mouth in the morning and at bedtime. 03/02/22   Collene Gobble, MD  Testosterone 20.25 MG/1.25GM (1.62%) GEL Place 1.25 g onto the skin daily. 06/29/22   Janith Lima, MD  Tiotropium Bromide Monohydrate (SPIRIVA  RESPIMAT) 2.5 MCG/ACT AERS Inhale 2 puffs into the lungs daily. 02/10/22   Parrett, Fonnie Mu, NP  torsemide (DEMADEX) 20 MG tablet Take 20 mg by mouth daily.    [provider]  traMADol (ULTRAM) 50 MG tablet Take 1 tablet (50 mg total) by mouth every 8 (eight) hours as needed. 02/17/22   Janith Lima, MD  vitamin B-12 (CYANOCOBALAMIN) 1000 MCG tablet Take 1,000 mcg by mouth daily.    [provider]    Family History    Family History  Problem Relation Age of Onset   Lung cancer Father    High blood pressure Mother    Diabetes Maternal Grandmother    Colon polyps Neg Hx    Esophageal cancer Neg Hx    Pancreatic cancer Neg Hx    Stomach cancer Neg Hx    He indicated that his mother is alive. He indicated that his father is deceased. He indicated that his maternal grandmother is deceased. He indicated that his maternal grandfather is deceased. He indicated that his paternal grandmother is deceased. He indicated that his paternal grandfather is deceased. He indicated that his daughter is alive. He indicated that the status of his neg hx is unknown.  Social History    Social History   Socioeconomic History   Marital status: Single    Spouse name: Not on file   Number of children: 1   Years of education: 12   Highest education level: Not on file  Occupational History   Occupation: disabled  Tobacco Use   Smoking status: Some Days    Packs/day: 2.00    Years: 46.00    Total pack years: 92.00    Types: Cigarettes    Last attempt to quit: 01/27/2022    Years since quitting: 0.6    Passive exposure: Past   Smokeless tobacco: Never   Tobacco comments:    Had 3 cigarettes in past 2 weeks hfb 07/03/2022  Vaping Use   Vaping Use: Never used  Substance and Sexual Activity   Alcohol use: Not Currently    Alcohol/week: 28.0 standard drinks of alcohol    Types: 28 Cans of beer per week   Drug use: No   Sexual activity: Yes    Partners: Female    Birth  control/protection: None  Other Topics Concern   Not on file  Social History Narrative   Right handed   Tea sometimes and coffee (1/2 caffeine)   Social Determinants of Health   Financial Resource Strain: Medium Risk (04/24/2021)   Overall Financial Resource Strain (CARDIA)    Difficulty of Paying Living Expenses: Somewhat hard  Food Insecurity: No Food Insecurity (04/16/2021)   Hunger Vital Sign    Worried About Running Out of Food in the Last Year: Never true    Ran Out of Food in the Last Year: Never true  Transportation Needs: No Transportation Needs (04/16/2021)   PRAPARE - Hydrologist (Medical): No    Lack of Transportation (Non-Medical): No  Physical Activity: Not on file  Stress: Not on file  Social Connections: Not on file  Intimate Partner Violence: Not on file     Review of Systems    General:  No chills, fever, night sweats or weight changes.  Cardiovascular:  No chest pain, dyspnea on exertion, edema, orthopnea, palpitations, paroxysmal nocturnal dyspnea. Dermatological: No rash, lesions/masses Respiratory: No cough, dyspnea Urologic: No hematuria, dysuria Abdominal:   No nausea, vomiting, diarrhea, bright red blood per rectum, melena, or hematemesis Neurologic:  No visual changes, wkns, changes in mental status. All other systems reviewed and are otherwise negative except as noted above.  Physical Exam    VS:  BP 100/70   Pulse 84   Ht '5\' 10"'$  (1.778 m)   Wt 232 lb 9.6 oz (105.5 kg)   SpO2 99%  BMI 33.37 kg/m  , BMI Body mass index is 33.37 kg/m. GEN: Well nourished, well developed, in no acute distress. HEENT: normal. Neck: Supple, no JVD, carotid bruits, or masses. Cardiac: RRR, no murmurs, rubs, or gallops. No clubbing, cyanosis, edema.  Radials/DP/PT 2+ and equal bilaterally.  Respiratory:  Respirations regular and unlabored, clear to auscultation bilaterally. GI: Soft, nontender, nondistended, BS + x 4. MS: no deformity  or atrophy.  Chest wall pain Skin: warm and dry, no rash. Neuro:  Strength and sensation are intact. Psych: Normal affect.  Accessory Clinical Findings    Recent Labs: 06/05/2022: ALT 19 06/19/2022: B Natriuretic Peptide 159.1; Hemoglobin 14.9; Platelets 200 09/14/2022: BUN 18; Creatinine, Ser 1.07; Potassium 4.3; Pro B Natriuretic peptide (BNP) 167.0; Sodium 139   Recent Lipid Panel    Component Value Date/Time   CHOL 222 (H) 09/15/2022 1330   TRIG 257.0 (H) 09/15/2022 1330   HDL 51.90 09/15/2022 1330   CHOLHDL 4 09/15/2022 1330   VLDL 51.4 (H) 09/15/2022 1330   LDLCALC 117 (H) 10/20/2021 1406   LDLCALC 108 (H) 07/10/2020 1035   LDLDIRECT 157.0 09/15/2022 1330         ECG personally reviewed by me today-V paced rhythm significant artifact nonspecific interventricular block around 95 bpm- No acute changes  Echocardiogram 07/31/2021  IMPRESSIONS     1. Diffuse hypokinesis worse in inferior base with abnormal septal motion  EF similar to TTE done 04/16/21 . Left ventricular ejection fraction, by  estimation, is 30 to 35%. The left ventricle has moderately decreased  function. The left ventricle  demonstrates global hypokinesis. There is mild left ventricular  hypertrophy. Left ventricular diastolic parameters are indeterminate.   2. Right ventricular systolic function is moderately reduced. The right  ventricular size is mildly enlarged. There is normal pulmonary artery  systolic pressure.   3. The mitral valve is abnormal. Mild mitral valve regurgitation. No  evidence of mitral stenosis.   4. The aortic valve is tricuspid. Aortic valve regurgitation is not  visualized. Mild aortic valve sclerosis is present, with no evidence of  aortic valve stenosis.   5. Aortic dilatation noted. There is mild dilatation of the ascending  aorta, measuring 40 mm.   6. The inferior vena cava is dilated in size with <50% respiratory  variability, suggesting right atrial pressure of 15  mmHg.  Assessment & Plan   1.  Chest wall pain-pain reproducible with deep palpation.  Patient reassured that his pain is not related to cardiac issues.  He expressed understanding. Rest area Use cool and warm compresses Slowly increase physical activity  Chronic combined systolic and diastolic CHF-euvolemic.  Weight stable.  No increased DOE or activity intolerance.  NYHA class II-3. Continue losartan, torsemide Heart healthy low-sodium diet-salty 6 given  Essential hypertension-BP today 100/70 Continue losartan Heart healthy low-sodium diet-salty 6 given Increase physical activity as tolerated  OSA-reports compliance with CPAP.  Waking up well rested. Continue CPAP use Continue weight loss  Morbid obesity-working on increasing physical activity and decreasing caloric intake. Continue weight loss  COPD-no increased DOE.  Smoking cessation strongly recommended. Continues to use 1-2 cigarettes/day. Follows with pulmonology  Disposition: Follow-up with Dr.Berry or me in 4-6 months.   Jossie Ng. Alija Riano NP-C     09/28/2022, 2:36 PM [address] Office [phone] Fax [phone]  Notice: This dictation was prepared with Dragon dictation along with smaller phrase technology. Any transcriptional errors that result from this process are  unintentional and may not be corrected upon review.  I spent 14 minutes examining this patient, reviewing medications, and using patient centered shared decision making involving her cardiac care.  Prior to her visit I spent greater than 20 minutes reviewing her past medical history,  medications, and prior cardiac tests.

## 2022-09-24 ENCOUNTER — Telehealth: Payer: Self-pay | Admitting: Adult Health

## 2022-09-24 NOTE — Telephone Encounter (Signed)
Overnight oximetry test done on September 17, 2022 showed minimum desaturations on 3 L of oxygen with trilogy vent.  7 hours and 38-minute duration.  48 minutes were spent less than 88%.  Baseline O2 saturation was 92%.  Remained above 90% 6 hours 34 minutes.  Patient is continue on oxygen at 4 L with trilogy vent at nighttime.  If needs to travel may use a portable continuous flow portable concentrator at 3 L with trilogy vent for short intervals.  Patient is aware he does desat some but overall keeps an adequate saturation.  Patient aware .

## 2022-09-26 DIAGNOSIS — J449 Chronic obstructive pulmonary disease, unspecified: Secondary | ICD-10-CM | POA: Diagnosis not present

## 2022-09-26 DIAGNOSIS — J9610 Chronic respiratory failure, unspecified whether with hypoxia or hypercapnia: Secondary | ICD-10-CM | POA: Diagnosis not present

## 2022-09-27 DIAGNOSIS — J449 Chronic obstructive pulmonary disease, unspecified: Secondary | ICD-10-CM | POA: Diagnosis not present

## 2022-09-27 DIAGNOSIS — I5042 Chronic combined systolic (congestive) and diastolic (congestive) heart failure: Secondary | ICD-10-CM | POA: Diagnosis not present

## 2022-09-28 ENCOUNTER — Ambulatory Visit: Payer: 59 | Attending: General Practice | Admitting: General Practice

## 2022-09-28 ENCOUNTER — Encounter: Payer: Self-pay | Admitting: General Practice

## 2022-09-28 VITALS — BP 100/70 | HR 84 | Ht 70.0 in | Wt 232.6 lb

## 2022-09-28 DIAGNOSIS — G4733 Obstructive sleep apnea (adult) (pediatric): Secondary | ICD-10-CM | POA: Diagnosis not present

## 2022-09-28 DIAGNOSIS — J449 Chronic obstructive pulmonary disease, unspecified: Secondary | ICD-10-CM

## 2022-09-28 DIAGNOSIS — I5042 Chronic combined systolic (congestive) and diastolic (congestive) heart failure: Secondary | ICD-10-CM

## 2022-09-28 DIAGNOSIS — I10 Essential (primary) hypertension: Secondary | ICD-10-CM | POA: Diagnosis not present

## 2022-09-28 NOTE — Addendum Note (Signed)
Addended by: Waylan Rocher on: 09/28/2022 03:03 PM   Modules accepted: Orders

## 2022-09-28 NOTE — Patient Instructions (Signed)
Medication Instructions:  The current medical regimen is effective;  continue present plan and medications as directed. Please refer to the Current Medication list given to you today.  *If you need a refill on your cardiac medications before your next appointment, please call your pharmacy*  Lab Work: NONE If you have any lab test that is abnormal or we need to change your treatment, we will call you to review the results.  Testing/Procedures: NONE   Other Instructions FOR YOUR PAIN MAY REST, MAY USE COOL OR WARM COMPRESSES.  Follow-Up: At [facility], you and your health needs are our priority.  As part of our continuing mission to provide you with exceptional heart care, we have created designated Provider Care Teams.  These Care Teams include your primary Cardiologist (physician) and Advanced Practice Providers (APPs -  Physician Assistants and Nurse Practitioners) who all work together to provide you with the care you need, when you need it.  Your next appointment:   9-12 month(s)  The format for your next appointment:   In Person  Provider:   Quay Burow, MD  or Coletta Memos, FNP OR ANY OTHER PA/NP   Important Information About Sugar

## 2022-10-02 ENCOUNTER — Telehealth: Payer: Self-pay | Admitting: Adult Health

## 2022-10-02 NOTE — Telephone Encounter (Signed)
Looks like the original PA was for the generic, roflumilast. Please confirm which strength using and will start PA for name brand Daliresp

## 2022-10-02 NOTE — Telephone Encounter (Signed)
Patient states he cannot take the generic of Daliresp, he had a bad flare from taking the generic last night. Pt states we were suppose to send a letter of necessity to his insurance to get them to cover the name brand but not sure if we did. Patient would like the Daliresp 58mg tablets not the 2527m tablets. Please advise.

## 2022-10-02 NOTE — Telephone Encounter (Signed)
Called and spoke with patient. He stated that he tried the generic Daliresp again and it caused the same reaction as before with increased SOB. He thought that our office had submitted a PA for the brand new and had submitted a letter stating why he is not able to tolerate the generic. I advised him that I would double check with the PA team to see if the PA was for the generic.   While on the phone, he stated that he would like to go back to the 564mg dose if possible. [facility] [address]   PDwightTeam, can you all advise if the PA was for the generic or brand name.   TP, please advise if you are ok with him going back to the Daliresp 5033m. Thanks!

## 2022-10-06 NOTE — Telephone Encounter (Signed)
Patient called and would like an update to see if he was approval for Daliresp. Please call and advise

## 2022-10-07 ENCOUNTER — Ambulatory Visit (INDEPENDENT_AMBULATORY_CARE_PROVIDER_SITE_OTHER): Payer: 59

## 2022-10-07 DIAGNOSIS — I442 Atrioventricular block, complete: Secondary | ICD-10-CM | POA: Diagnosis not present

## 2022-10-07 LAB — CUP PACEART REMOTE DEVICE CHECK
Battery Remaining Longevity: 95 mo
Battery Remaining Percentage: 86 %
Battery Voltage: 3.01 V
Brady Statistic AP VP Percent: 12 %
Brady Statistic AP VS Percent: 2.3 %
Brady Statistic AS VP Percent: 49 %
Brady Statistic AS VS Percent: 36 %
Brady Statistic RA Percent Paced: 12 %
Brady Statistic RV Percent Paced: 61 %
Date Time Interrogation Session: 20231018020013
Implantable Lead Implant Date: 20220427
Implantable Lead Implant Date: 20220427
Implantable Lead Location: 753859
Implantable Lead Location: 753860
Implantable Pulse Generator Implant Date: 20220427
Lead Channel Impedance Value: 460 Ohm
Lead Channel Impedance Value: 640 Ohm
Lead Channel Pacing Threshold Amplitude: 0.5 V
Lead Channel Pacing Threshold Amplitude: 0.75 V
Lead Channel Pacing Threshold Pulse Width: 0.5 ms
Lead Channel Pacing Threshold Pulse Width: 0.5 ms
Lead Channel Sensing Intrinsic Amplitude: 11 mV
Lead Channel Sensing Intrinsic Amplitude: 2.7 mV
Lead Channel Setting Pacing Amplitude: 2 V
Lead Channel Setting Pacing Amplitude: 2.5 V
Lead Channel Setting Pacing Pulse Width: 0.5 ms
Lead Channel Setting Sensing Sensitivity: 2 mV
Pulse Gen Model: 2272
Pulse Gen Serial Number: 3920156

## 2022-10-07 NOTE — Telephone Encounter (Signed)
Still waiting to hear from TP about what strength for the daliresp. Tammy, please advise on this.

## 2022-10-07 NOTE — Telephone Encounter (Signed)
Patient is calling again for the pre auth for his medication daliresp.  He stated he is almost out of the medication and would like to know if it is approved.  Please advise and call patient to let him know at [phone]

## 2022-10-08 DIAGNOSIS — I5042 Chronic combined systolic (congestive) and diastolic (congestive) heart failure: Secondary | ICD-10-CM | POA: Diagnosis not present

## 2022-10-08 DIAGNOSIS — J449 Chronic obstructive pulmonary disease, unspecified: Secondary | ICD-10-CM | POA: Diagnosis not present

## 2022-10-08 NOTE — Telephone Encounter (Signed)
PA Team, can we do PA for brand name Daliresp? Thanks!

## 2022-10-08 NOTE — Telephone Encounter (Signed)
Please send Brand name (DAW) Daliresp '500mg'$  daily

## 2022-10-09 ENCOUNTER — Other Ambulatory Visit (HOSPITAL_COMMUNITY): Payer: Self-pay

## 2022-10-09 ENCOUNTER — Telehealth: Payer: Self-pay

## 2022-10-09 NOTE — Telephone Encounter (Signed)
Robert Church, CPhT      10/09/22  8:27 AM Note Initiated PA investigation for [employer] per request by care team.   [name] '500mg'$  tab DAW1 is COVERED through [place]; however the co-pay is currently $307.53 for a 30 day supply due to the product selection of Brand.        Called and spoke with patient. He verbalized understanding. He mentioned that after Nov 1st his insurance will change to [facility] and he knows they will not cover the [employer]. I attempted to see if patient assistance was an option. [employer] discontinued patient assistance for Daliresp on July 1st.   He wanted to know if there was another medication similar to Daliresp that he can take. He has about 18 tablets left from an old prescription on hand.   TP, can you please advise of any other possible alternatives for Daliresp? Thanks!

## 2022-10-09 NOTE — Telephone Encounter (Signed)
See encounter from 10/07/22.

## 2022-10-09 NOTE — Telephone Encounter (Signed)
Initiated PA investigation for [employer] per request by care team.  [name] '500mg'$  tab DAW1 is COVERED through [place]; however the co-pay is currently $307.53 for a 30 day supply due to the product selection of Brand.

## 2022-10-09 NOTE — Telephone Encounter (Signed)
Thank so much for spending so much time on this highly appreciated  Please let patient know that this is not covered he is welcome to try the generic form that is our only option there is not an alternative for Daliresp.  Can discuss in detail at follow-up visit with [name].  We will also copy [name] if he has any other suggestions

## 2022-10-12 ENCOUNTER — Other Ambulatory Visit: Payer: Self-pay

## 2022-10-12 DIAGNOSIS — I5042 Chronic combined systolic (congestive) and diastolic (congestive) heart failure: Secondary | ICD-10-CM

## 2022-10-13 NOTE — Telephone Encounter (Signed)
Called and spoke with patient, advised of results of research on name brand Daliresp.  He was asking about other inhalers, he is currently on dulera and spiriva.  He was wondering if there was an inhaler that would work better for him.  It dose not look like he has been on [place], he would like to discuss this with Dr. Brock Ra.  Scheduled him an appointment on Wednesday November 8th at 3:45 pm, advised to arrive by 3:30 pm for check in.  He stated he is going to have some heart surgery since some of his sob is caused by his heart problems and after the surgery his breathing may be better.  He verbalized understanding.  Nothing further needed.

## 2022-10-13 NOTE — Telephone Encounter (Signed)
Thank you for working on this. No other substitutes available - will have to try generic daliresp if he agrees

## 2022-10-17 ENCOUNTER — Other Ambulatory Visit: Payer: Self-pay | Admitting: Emergency Medicine

## 2022-10-18 ENCOUNTER — Other Ambulatory Visit: Payer: Self-pay | Admitting: Internal Medicine

## 2022-10-18 DIAGNOSIS — F411 Generalized anxiety disorder: Secondary | ICD-10-CM

## 2022-10-20 NOTE — Progress Notes (Signed)
Remote pacemaker transmission.   

## 2022-10-23 ENCOUNTER — Telehealth: Payer: Self-pay

## 2022-10-23 NOTE — Telephone Encounter (Signed)
Opening error 

## 2022-10-23 NOTE — Telephone Encounter (Signed)
The patient called stating he has changed his insurance and he is now with [employer]. He is supposed to have a gen change in November. His pacemaker has to be pre approved before his surgery. He would like for someone to give him a call back.   He also wants to know if he can have a bowl of oatmeal around 6 am since his surgery is at 2 pm that day. I told him I will have the nurse give him a call back.

## 2022-10-23 NOTE — Telephone Encounter (Signed)
Left message to call back  

## 2022-10-26 NOTE — Telephone Encounter (Signed)
Pt aware I will reach out to billing and ensure they uses [employer] for precert. Aware he will need to remain NPO after MN night before procedure, no breakfast morning of. Aware ok to take his Xanax, Prednisone & Daliresp the morning of procedure if needed per Dr. Curt Bears. Patient verbalized understanding and agreeable to plan.

## 2022-10-28 ENCOUNTER — Encounter: Payer: Self-pay | Admitting: Emergency Medicine

## 2022-10-28 ENCOUNTER — Ambulatory Visit (INDEPENDENT_AMBULATORY_CARE_PROVIDER_SITE_OTHER): Payer: Medicare Other | Admitting: Emergency Medicine

## 2022-10-28 VITALS — BP 130/76 | HR 75 | Temp 98.4°F | Ht 70.0 in | Wt 235.4 lb

## 2022-10-28 DIAGNOSIS — F17210 Nicotine dependence, cigarettes, uncomplicated: Secondary | ICD-10-CM

## 2022-10-28 DIAGNOSIS — J9611 Chronic respiratory failure with hypoxia: Secondary | ICD-10-CM | POA: Diagnosis not present

## 2022-10-28 DIAGNOSIS — J449 Chronic obstructive pulmonary disease, unspecified: Secondary | ICD-10-CM | POA: Diagnosis not present

## 2022-10-28 DIAGNOSIS — J302 Other seasonal allergic rhinitis: Secondary | ICD-10-CM | POA: Diagnosis not present

## 2022-10-28 DIAGNOSIS — K2100 Gastro-esophageal reflux disease with esophagitis, without bleeding: Secondary | ICD-10-CM

## 2022-10-28 NOTE — Assessment & Plan Note (Signed)
Continue omeprazole as ordered 

## 2022-10-28 NOTE — Patient Instructions (Addendum)
Try stopping your Dulera and Spiriva.  We will try substituting Breztri 2 puffs twice a day.  Rinse and gargle after using. Keep your Ventolin available to use 2 puffs when needed for shortness of breath, chest tightness, wheezing. Continue your trilogy ventilator at night with oxygen 4 L/min bled in. Use your oxygen 2 L/min with exertion. Please continue your Singulair, Flonase, Atrovent nasal spray, omeprazole as you have been taking them We will try to get your Daliresp approved so you can remain on this medication.  [name] will communicate with [facility]. Follow [name] in 6 months, sooner if you have any problems.

## 2022-10-28 NOTE — Assessment & Plan Note (Signed)
Try stopping your Dulera and Spiriva.  We will try substituting Breztri 2 puffs twice a day.  Rinse and gargle after using. Keep your Ventolin available to use 2 puffs when needed for shortness of breath, chest tightness, wheezing. We will try to get your Daliresp approved so you can remain on this medication.  [name] will communicate with [facility]. Follow [name] in 6 months, sooner if you have any problems.

## 2022-10-28 NOTE — Assessment & Plan Note (Signed)
Continue your trilogy ventilator at night with oxygen 4 L/min bled in. Use your oxygen 2 L/min with exertion.

## 2022-10-28 NOTE — Progress Notes (Signed)
Subjective:  Patient ID: Robert Church, male    DOB: [birth year]  Age: 66 y.o. MRN: [id number]  CC:  Chief Complaint  Patient presents with   Follow-up    Talk about inhalers   HPI  ROV 02/27/22 --Mr. Haidar is a 66 year old man with history of tobacco use, severe COPD with chronic bronchitic phenotype, associated chronic hypoxemic respiratory failure for which he is on supplemental oxygen and a trilogy ventilator at night.  He occasionally uses O2 w exertion, does not see any desaturations on RA.  Also with CAD and ischemic cardiomyopathy with third-degree heart block and an ICD pacemaker, hypertension.  Underwent implantable Barostim for his CHF 01/2022 and diuretics are being titrated. Maintenance regimen prednisone 10 mg, Spiriva, Dulera, Singulair. He was on daliresp but began to have trouble - dizziness, leg pain, HA. He stopped the daliresp and these sx improved. He has been taking '250mg'$  bid. He is having exertional SOB. He coughs, usually at night and coughs up clear mucous. Still able to do some yard work.  Flonase, atrovent NS. Uses albuterol 4-5x a day. He doesn't tolerate erythromycin, is hesitant to start azithro.   ROV 10/28/2022 --66 year old man with a history of tobacco use and severe COPD with chronic bronchitis and chronic hypoxemic respiratory failure.  He is on a trilogy ventilator at night plus oxygen at 4 L/min (need documented on ONO from 09/17/2022), rarely uses oxygen 2L/min with exertion at liters per minute.  He has been managed on prednisone 10 mg daily, Dulera and Spiriva, Singulair, Daliresp, Flonase as needed, Atrovent nasal spray, omeprazole twice a day. He has tried generic daliresp but ineffective for him, wants to try to get brand daliresp approved.  [place] exception >> [phone], option 5   Objective:   Today's Vitals:  BP 130/76 (BP Location: Left Arm, Patient Position: Sitting, Cuff Size: Normal)   Pulse 75   Temp 98.4 F (36.9 C) (Oral)   Ht  '5\' 10"'$  (1.778 m)   Wt 235 lb 6.4 oz (106.8 kg)   SpO2 95%   BMI 33.78 kg/m   Gen: Pleasant, well-nourished, in no distress,  normal affect  ENT: No lesions,  mouth clear,  oropharynx clear, no postnasal drip  Neck: No JVD, no stridor  Lungs: No use of accessory muscles, distant, no crackles, B exp wheezes.   Cardiovascular: RRR, heart sounds normal, no murmur or gallops, no peripheral edema  Musculoskeletal: No deformities, no cyanosis or clubbing  Neuro: alert, awake, non focal  Skin: Warm, no lesions or rash   Assessment & Plan:  COPD (chronic obstructive pulmonary disease) (HCC) Try stopping your Dulera and Spiriva.  We will try substituting Breztri 2 puffs twice a day.  Rinse and gargle after using. Keep your Ventolin available to use 2 puffs when needed for shortness of breath, chest tightness, wheezing. We will try to get your Daliresp approved so you can remain on this medication.  [name] will communicate with [facility]. Follow [name] in 6 months, sooner if you have any problems.  Chronic respiratory failure with hypoxia (HCC) Continue your trilogy ventilator at night with oxygen 4 L/min bled in. Use your oxygen 2 L/min with exertion.  Cigarette smoker Discussed cessation with him today  Seasonal allergic rhinitis Please continue your Singulair, Flonase, Atrovent nasal spray  GERD (gastroesophageal reflux disease) Continue omeprazole as ordered  Time spent 45 minutes  Baltazar Apo, MD, PhD 10/28/2022, 4:54 PM [facility]  [phone] or if no answer [phone]

## 2022-10-28 NOTE — Assessment & Plan Note (Signed)
Discussed cessation with him today 

## 2022-10-28 NOTE — Assessment & Plan Note (Signed)
Please continue your Singulair, Flonase, Atrovent nasal spray

## 2022-10-29 ENCOUNTER — Telehealth: Payer: Self-pay | Admitting: Cardiology

## 2022-10-29 ENCOUNTER — Telehealth: Payer: Self-pay | Admitting: Emergency Medicine

## 2022-10-29 MED ORDER — BREZTRI AEROSPHERE 160-9-4.8 MCG/ACT IN AERO: 2.0000 | INHALATION_SPRAY | Freq: Two times a day (BID) | RESPIRATORY_TRACT | 2 refills | Status: DC

## 2022-10-29 MED ORDER — IPRATROPIUM BROMIDE 0.06 % NA SOLN: 2.0000 | Freq: Four times a day (QID) | NASAL | 3 refills | Status: DC

## 2022-10-29 NOTE — Telephone Encounter (Signed)
Called and spoke to patient and let him know that I was sending the Breztri and Atrovent nasal spray into pharmacy. Nothing further needed

## 2022-10-29 NOTE — Telephone Encounter (Signed)
Pt would like to know how soon he can drive after procedure. He states he is also trying to quit smoking and sucks on lifesavers to avoid a cigarette.  States that someone (he does know who) told him that he could do this the morning of procedure.  I informed pt that I do not believe Dr. Curt Bears would be agreeable to that but I would check with MD and let pt know next week his advisement. Patient verbalized understanding and agreeable to plan.

## 2022-10-29 NOTE — Telephone Encounter (Signed)
Patient would like to speak to nurse about his upcoming procedure. He has a question, would not go into further detail.

## 2022-11-03 ENCOUNTER — Ambulatory Visit: Payer: Medicare Other | Attending: Cardiology

## 2022-11-03 DIAGNOSIS — I5042 Chronic combined systolic (congestive) and diastolic (congestive) heart failure: Secondary | ICD-10-CM | POA: Diagnosis not present

## 2022-11-04 LAB — BASIC METABOLIC PANEL
BUN/Creatinine Ratio: 16 (ref 10–24)
BUN: 18 mg/dL (ref 8–27)
CO2: 26 mmol/L (ref 20–29)
Calcium: 9.3 mg/dL (ref 8.6–10.2)
Chloride: 100 mmol/L (ref 96–106)
Creatinine, Ser: 1.1 mg/dL (ref 0.76–1.27)
Glucose: 135 mg/dL — ABNORMAL HIGH (ref 70–99)
Potassium: 4.2 mmol/L (ref 3.5–5.2)
Sodium: 143 mmol/L (ref 134–144)
eGFR: 75 mL/min/{1.73_m2} (ref >59)

## 2022-11-04 LAB — CBC
Hematocrit: 46.6 % (ref 37.5–51.0)
Hemoglobin: 15.8 g/dL (ref 13.0–17.7)
MCH: 33.1 pg — ABNORMAL HIGH (ref 26.6–33.0)
MCHC: 33.9 g/dL (ref 31.5–35.7)
MCV: 98 fL — ABNORMAL HIGH (ref 79–97)
Platelets: 213 10*3/uL (ref 150–450)
RBC: 4.78 x10E6/uL (ref 4.14–5.80)
RDW: 12.3 % (ref 11.6–15.4)
WBC: 8.9 10*3/uL (ref 3.4–10.8)

## 2022-11-06 NOTE — Telephone Encounter (Signed)
Returned pt call. Questions answered.  NPO after MN, no grape at 3:00 am if he wakes up, no lifesavers, no xanax. Pt appreciates my call back.

## 2022-11-06 NOTE — Telephone Encounter (Signed)
Patient would a call back from [employer].

## 2022-11-10 MED ORDER — VANCOMYCIN HCL 1500 MG/300ML IV SOLN
1500.0000 mg | INTRAVENOUS | Status: AC
  Administered 2022-11-11: 1500 mg via INTRAVENOUS
  Filled 2022-11-10 (×3): qty 300

## 2022-11-10 NOTE — Pre-Procedure Instructions (Signed)
Instructed patient on the following items: Arrival time 1200 Nothing to eat or drink after midnight No meds AM of procedure Responsible person to drive you home and stay with you for 24 hrs Wash with special soap night before and morning of procedure

## 2022-11-10 NOTE — Telephone Encounter (Signed)
Pt is requesting call back to discuss upcoming procedure and requesting to speak to [place], Therapist, sports.

## 2022-11-10 NOTE — Telephone Encounter (Signed)
Pt wanted to ensure he was going to be discharged from hospital tomorrow, after his procedure, before dark. Aware that there is not a guarantee of this and that he should prepare to be there after dark just in case. Patient verbalized understanding and agreeable to plan.

## 2022-11-11 ENCOUNTER — Other Ambulatory Visit: Payer: Self-pay

## 2022-11-11 ENCOUNTER — Ambulatory Visit (HOSPITAL_COMMUNITY): Payer: Medicare Other

## 2022-11-11 ENCOUNTER — Encounter (HOSPITAL_COMMUNITY)
Admission: RE | Disposition: A | Discharge status: Home / Self Care | Payer: Medicare Other | Source: Home / Self Care | Attending: Cardiology

## 2022-11-11 ENCOUNTER — Ambulatory Visit (HOSPITAL_COMMUNITY)
Admission: RE | Admit: 2022-11-11 | Discharge: 2022-11-11 | Disposition: A | Discharge status: Home / Self Care | Payer: Medicare Other | Attending: Cardiology | Admitting: Cardiology

## 2022-11-11 DIAGNOSIS — R001 Bradycardia, unspecified: Secondary | ICD-10-CM | POA: Diagnosis not present

## 2022-11-11 DIAGNOSIS — Z6833 Body mass index (BMI) 33.0-33.9, adult: Secondary | ICD-10-CM | POA: Insufficient documentation

## 2022-11-11 DIAGNOSIS — G4733 Obstructive sleep apnea (adult) (pediatric): Secondary | ICD-10-CM | POA: Insufficient documentation

## 2022-11-11 DIAGNOSIS — Z45010 Encounter for checking and testing of cardiac pacemaker pulse generator [battery]: Secondary | ICD-10-CM | POA: Insufficient documentation

## 2022-11-11 DIAGNOSIS — J449 Chronic obstructive pulmonary disease, unspecified: Secondary | ICD-10-CM | POA: Insufficient documentation

## 2022-11-11 DIAGNOSIS — Z950 Presence of cardiac pacemaker: Secondary | ICD-10-CM | POA: Diagnosis not present

## 2022-11-11 DIAGNOSIS — E669 Obesity, unspecified: Secondary | ICD-10-CM | POA: Diagnosis not present

## 2022-11-11 DIAGNOSIS — F17210 Nicotine dependence, cigarettes, uncomplicated: Secondary | ICD-10-CM | POA: Diagnosis not present

## 2022-11-11 DIAGNOSIS — I428 Other cardiomyopathies: Secondary | ICD-10-CM | POA: Diagnosis not present

## 2022-11-11 DIAGNOSIS — I110 Hypertensive heart disease with heart failure: Secondary | ICD-10-CM | POA: Insufficient documentation

## 2022-11-11 DIAGNOSIS — E785 Hyperlipidemia, unspecified: Secondary | ICD-10-CM | POA: Diagnosis not present

## 2022-11-11 DIAGNOSIS — I5022 Chronic systolic (congestive) heart failure: Secondary | ICD-10-CM | POA: Insufficient documentation

## 2022-11-11 DIAGNOSIS — I442 Atrioventricular block, complete: Secondary | ICD-10-CM | POA: Insufficient documentation

## 2022-11-11 DIAGNOSIS — I517 Cardiomegaly: Secondary | ICD-10-CM | POA: Diagnosis not present

## 2022-11-11 HISTORY — PX: BIV UPGRADE: EP1202

## 2022-11-11 SURGERY — BIV UPGRADE

## 2022-11-11 MED ORDER — FENTANYL CITRATE (PF) 100 MCG/2ML IJ SOLN
INTRAMUSCULAR | Status: AC
  Filled 2022-11-11: qty 2

## 2022-11-11 MED ORDER — MIDAZOLAM HCL 5 MG/5ML IJ SOLN
INTRAMUSCULAR | Status: DC | PRN
  Administered 2022-11-11 (×2): 1 mg via INTRAVENOUS

## 2022-11-11 MED ORDER — LIDOCAINE HCL (PF) 1 % IJ SOLN
INTRAMUSCULAR | Status: AC
  Filled 2022-11-11: qty 30

## 2022-11-11 MED ORDER — ONDANSETRON HCL 4 MG/2ML IJ SOLN: 4.0000 mg | Freq: Four times a day (QID) | INTRAMUSCULAR | Status: DC | PRN

## 2022-11-11 MED ORDER — IOHEXOL 350 MG/ML SOLN
INTRAVENOUS | Status: DC | PRN
  Administered 2022-11-11: 25 mL

## 2022-11-11 MED ORDER — HEPARIN (PORCINE) IN NACL 1000-0.9 UT/500ML-% IV SOLN
INTRAVENOUS | Status: DC | PRN
  Administered 2022-11-11: 500 mL

## 2022-11-11 MED ORDER — IODIXANOL 320 MG/ML IV SOLN: INTRAVENOUS | Status: DC | PRN

## 2022-11-11 MED ORDER — FENTANYL CITRATE (PF) 100 MCG/2ML IJ SOLN
INTRAMUSCULAR | Status: DC | PRN
  Administered 2022-11-11 (×2): 25 ug via INTRAVENOUS

## 2022-11-11 MED ORDER — SODIUM CHLORIDE 0.9 % IV SOLN
80.0000 mg | INTRAVENOUS | Status: AC
  Administered 2022-11-11: 80 mg

## 2022-11-11 MED ORDER — SODIUM CHLORIDE 0.9 % IV SOLN
INTRAVENOUS | Status: AC
  Filled 2022-11-11: qty 2

## 2022-11-11 MED ORDER — POVIDONE-IODINE 10 % EX SWAB
2.0000 | Freq: Once | CUTANEOUS | Status: AC
Start: 2022-11-11 — End: 2022-11-11
  Administered 2022-11-11: 2 via TOPICAL

## 2022-11-11 MED ORDER — SODIUM CHLORIDE 0.9 % IV SOLN: INTRAVENOUS | Status: DC

## 2022-11-11 MED ORDER — MIDAZOLAM HCL 5 MG/5ML IJ SOLN
INTRAMUSCULAR | Status: AC
  Filled 2022-11-11: qty 5

## 2022-11-11 MED ORDER — CHLORHEXIDINE GLUCONATE 4 % EX LIQD
4.0000 | Freq: Once | CUTANEOUS | Status: DC
  Filled 2022-11-11: qty 60

## 2022-11-11 MED ORDER — ACETAMINOPHEN 325 MG PO TABS: 325.0000 mg | ORAL_TABLET | ORAL | Status: DC | PRN

## 2022-11-11 MED ORDER — LIDOCAINE HCL (PF) 1 % IJ SOLN
INTRAMUSCULAR | Status: DC | PRN
  Administered 2022-11-11: 60 mL

## 2022-11-11 SURGICAL SUPPLY — 13 items
CABLE SURGICAL S-101-97-12 (CABLE) ×1 IMPLANT
CATH CPS LOCATOR 3D LG (CATHETERS) IMPLANT
HELIX LOCKING TOOL (MISCELLANEOUS) ×1
LEAD ULTIPACE 65 LPA1231/65 (Lead) IMPLANT
PACEMAKER ALLR CRT-P RF PM3222 (Pacemaker) IMPLANT
PAD DEFIB RADIO PHYSIO CONN (PAD) ×1 IMPLANT
PPM ALLURE CRT-P RF PM3222 (Pacemaker) ×1 IMPLANT
SHEATH 9FR PRELUDE SNAP 13 (SHEATH) IMPLANT
SHEATH PROBE COVER 6X72 (BAG) IMPLANT
SLITTER AGILIS HISPRO (INSTRUMENTS) IMPLANT
TOOL HELIX LOCKING (MISCELLANEOUS) IMPLANT
TRAY PACEMAKER INSERTION (PACKS) ×1 IMPLANT
WIRE HI TORQ VERSACORE-J 145CM (WIRE) IMPLANT

## 2022-11-11 NOTE — Progress Notes (Signed)
Dr Curt Bears notified of CXR results and ok to d/c home

## 2022-11-11 NOTE — H&P (Signed)
Electrophysiology Office Note   Date:  11/11/2022   ID:  Robert Church, DOB 08-16-1956, MRN [id number]  PCP:  Janith Lima, MD  Cardiologist:  H[place] Primary Electrophysiologist:  Kamaree Wheatley Meredith Leeds, MD    Chief Complaint: pacemaker   History of Present Illness: ABDULAHI Church is a 66 y.o. male who is being seen today for the evaluation of pacemaker at the request of No ref. provider found. Presenting today for electrophysiology evaluation.  He has a history of nonischemic cardiomyopathy, COPD, obstructive sleep apnea on BiPAP, prior tobacco abuse, obesity, hypertension, hyperlipidemia, and heart failure.  He presented to the hospital April 2022 with complete heart block.  He is now status post Mildred dual-chamber pacemaker implanted 04/16/2021.  At the time of initial evaluation his heart block was intermittent.  His heart block has become permanent and he has now pacing 100% of the time.  Unfortunately his ejection fraction is reduced.  He has had multiple issues with volume overload and heart failure symptoms.  He has been having dizziness when he takes his Lasix.  He has not had any further episodes of syncope.  He had a left right and left heart catheterization that showed elevated pressures and no coronary artery disease.  Today, denies symptoms of palpitations, chest pain, shortness of breath, orthopnea, PND, lower extremity edema, claudication, dizziness, presyncope, syncope, bleeding, or neurologic sequela. The patient is tolerating medications without difficulties. Plan device upgrade today.    Past Medical History:  Diagnosis Date   Adenomatous colon polyp    Allergy    Anal fissure    Arthritis    Asthma    Bronchitis    CHF (congestive heart failure) (HCC)    COPD, group D, by GOLD 2017 classification ([place])    Dyspnea    Emphysema lung (HCC)    GERD (gastroesophageal reflux disease)    History of hiatal hernia    Hyperlipidemia    Hypertension    NICM  (nonischemic cardiomyopathy) (HCC)    OSA (obstructive sleep apnea)    Pneumonia    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   Requires supplemental oxygen    Sinusitis    SOB (shortness of breath)    Past Surgical History:  Procedure Laterality Date   BIV UPGRADE N/A 10/07/2021   Procedure: BIV PPM UPGRADE;  Surgeon: Constance Haw, MD;  Location: [place] [facility];  Service: Cardiovascular;  Laterality: N/A;   LEFT HEART CATH AND CORONARY ANGIOGRAPHY N/A 04/19/2017   Procedure: Left Heart Cath and Coronary Angiography;  Surgeon: Belva Crome, MD;  Location: [place] [facility];  Service: Cardiovascular;  Laterality: N/A;   PACEMAKER IMPLANT N/A 04/16/2021   Procedure: PACEMAKER IMPLANT;  Surgeon: Constance Haw, MD;  Location: [place] [facility];  Service: Cardiovascular;  Laterality: N/A;   RIGHT/LEFT HEART CATH AND CORONARY ANGIOGRAPHY N/A 08/22/2021   Procedure: RIGHT/LEFT HEART CATH AND CORONARY ANGIOGRAPHY;  Surgeon: Jolaine Artist, MD;  Location: [place] [facility];  Service: Cardiovascular;  Laterality: N/A;   TONSILLECTOMY       Current Facility-Administered Medications  Medication Dose Route Frequency Provider Last Rate Last Admin   0.9 %  sodium chloride infusion   Intravenous Continuous Constance Haw, MD 50 mL/hr at 11/11/22 1304 New Bag at 11/11/22 1304   0.9 %  sodium chloride infusion   Intravenous Continuous Constance Haw, MD 50 mL/hr at 11/11/22 1303 New Bag at  11/11/22 1303   chlorhexidine (HIBICLENS) 4 % liquid 4 Application  4 Application Topical Once Parissa Chiao Hassell Done, MD       gentamicin (GARAMYCIN) 80 mg in sodium chloride 0.9 % 500 mL irrigation  80 mg Irrigation On Call Katori Wirsing Hassell Done, MD       vancomycin (VANCOREADY) IVPB 1500 mg/300 mL  1,500 mg Intravenous On Call Constance Haw, MD        Allergies:   Spironolactone, Daliresp [roflumilast], Jardiance [empagliflozin], Penicillins, Clindamycin,  Doxycycline, E-mycin [erythromycin base], and Rosuvastatin   Social History:  The patient  reports that he has been smoking cigarettes. He has a 92.00 pack-year smoking history. He has been exposed to tobacco smoke. He has never used smokeless tobacco. He reports that he does not currently use alcohol after a past usage of about 28.0 standard drinks of alcohol per week. He reports that he does not use drugs.   Family History:  The patient's family history includes Diabetes in his maternal grandmother; High blood pressure in his mother; Lung cancer in his father.   ROS:  Please see the history of present illness.   Otherwise, review of systems is positive for none.   All other systems are reviewed and negative.   PHYSICAL EXAM: VS:  BP 134/87   Pulse 80   Temp (!) 97.3 F (36.3 C) (Temporal)   Resp 17   Ht '5\' 10"'$  (1.778 m)   Wt 106.6 kg   SpO2 96%   BMI 33.72 kg/m  , BMI Body mass index is 33.72 kg/m. GEN: Well nourished, well developed, in no acute distress  HEENT: normal  Neck: no JVD, carotid bruits, or masses Cardiac: RRR; no murmurs, rubs, or gallops,no edema  Respiratory:  clear to auscultation bilaterally, normal work of breathing GI: soft, nontender, nondistended, + BS MS: no deformity or atrophy  Skin: warm and dry Neuro:  Strength and sensation are intact Psych: euthymic mood, full affect  Results: 06/05/2022: ALT 19 06/19/2022: B Natriuretic Peptide 159.1 09/14/2022: Pro B Natriuretic peptide (BNP) 167.0 11/03/2022: BUN 18; Creatinine, Ser 1.10; Hemoglobin 15.8; Platelets 213; Potassium 4.2; Sodium 143    Lipid Panel     Component Value Date/Time   CHOL 222 (H) 09/15/2022 1330   TRIG 257.0 (H) 09/15/2022 1330   HDL 51.90 09/15/2022 1330   CHOLHDL 4 09/15/2022 1330   VLDL 51.4 (H) 09/15/2022 1330   LDLCALC 117 (H) 10/20/2021 1406   LDLCALC 108 (H) 07/10/2020 1035   LDLDIRECT 157.0 09/15/2022 1330     Wt Readings from Last 3 Encounters:  11/11/22 106.6 kg   10/28/22 106.8 kg  09/28/22 105.5 kg      Other studies Reviewed: Additional studies/ records that were reviewed today include: RHC/LHC 08/22/21  Review of the above records today demonstrates:  Normal coronary arteries Mildly elevated filling pressures with normal output NICM EF 30-35%  TTE 07/31/21  1. Diffuse hypokinesis worse in inferior base with abnormal septal motion  EF similar to TTE done 04/16/21 . Left ventricular ejection fraction, by  estimation, is 30 to 35%. The left ventricle has moderately decreased  function. The left ventricle  demonstrates global hypokinesis. There is mild left ventricular  hypertrophy. Left ventricular diastolic parameters are indeterminate.   2. Right ventricular systolic function is moderately reduced. The right  ventricular size is mildly enlarged. There is normal pulmonary artery  systolic pressure.   3. The mitral valve is abnormal. Mild mitral valve regurgitation. No  evidence of  mitral stenosis.   4. The aortic valve is tricuspid. Aortic valve regurgitation is not  visualized. Mild aortic valve sclerosis is present, with no evidence of  aortic valve stenosis.   5. Aortic dilatation noted. There is mild dilatation of the ascending  aorta, measuring 40 mm.   6. The inferior vena cava is dilated in size with <50% respiratory  variability, suggesting right atrial pressure of 15 mmHg.    ASSESSMENT AND PLAN:  1.  1.  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  The various methods of treatment have been discussed with the patient and family. After consideration of risks, benefits and other options for treatment, the patient has consented to  Procedure(s): Pacemaker implant as a surgical intervention .  Risks include but not limited to bleeding, infection, pneumothorax, perforation, tamponade, vascular damage, renal failure, MI, stroke,  death, and lead dislodgement . The patient's history has been reviewed, patient examined, no change in status, stable for surgery.  I have reviewed the patient's chart and labs.  Questions were answered to the patient's satisfaction.    Dailey Buccheri Curt Bears, MD 11/11/2022 1:59 PM

## 2022-11-11 NOTE — Discharge Instructions (Signed)
     Supplemental Discharge Instructions for  Pacemaker/Defibrillator Patients  Tomorrow, 11/12/22, send in a device transmission  Activity No heavy lifting or vigorous activity with your left/right arm for 6 to 8 weeks.  Do not raise your left/right arm above your head for one week.  Gradually raise your affected arm as drawn below.             11/17/22                    11/18/22                   11/19/22                  11/20/22 __  NO DRIVING until cleared to at  your wound check visit  Wright the wound area clean and dry.  Do not get this area wet , no showers until cleared to at your wound check visit . Tomorrow, 11/12/22, remove the arm sling Tomorrow, 11/12/22 remove the outer plastic bandage.  Underneath the plastic bandage there are steri strips (paper tapes), DO NOT remove these. The tape/steri-strips on your wound will fall off; do not pull them off.  No bandage is needed on the site.  DO  NOT apply any creams, oils, or ointments to the wound area. If you notice any drainage or discharge from the wound, any swelling or bruising at the site, or you develop a fever > 101? F after you are discharged home, call the office at once.  Special Instructions You are still able to use cellular telephones; use the ear opposite the side where you have your pacemaker/defibrillator.  Avoid carrying your cellular phone near your device. When traveling through airports, show security personnel your identification card to avoid being screened in the metal detectors.  Ask the security personnel to use the hand wand. Avoid arc welding equipment, MRI testing (magnetic resonance imaging), TENS units (transcutaneous nerve stimulators).  Call the office for questions about other devices. Avoid electrical appliances that are in poor condition or are not properly grounded. Microwave ovens are safe to be near or to operate.

## 2022-11-13 ENCOUNTER — Encounter (HOSPITAL_COMMUNITY): Payer: Self-pay | Admitting: Cardiology

## 2022-11-13 ENCOUNTER — Telehealth: Payer: Self-pay | Admitting: Physician Assistant

## 2022-11-13 NOTE — Telephone Encounter (Signed)
Patient is a 66 year old male with past medical history of nonischemic cardiomyopathy, chronic combined systolic and diastolic heart failure, carotid artery calcification, third-degree AV block status post recent biventricular pacemaker, hyperlipidemia and DM2.  As mentioned above, patient underwent biventricular pacemaker upgrade on 11/11/2022.  Since discharge, patient has been noticing left elbow pain and swelling and also left hand swelling as well.  He never had left elbow pain prior to the recent pacemaker implantation.  Differential diagnosis in this case would include decreased venous return in the left arm due to placement of pacemaker lead, treatment in this case would be to elevate his arm when medically able from the pacemaker perspective.  He probably can start raising his arm slowly 5 days after discharge.  Second possibility is the development of a left upper extremity DVT.  I will forward this note to our scheduler to contact the patient and arrange early follow-up with [facility].  Once the patient is seen, patient likely will require a left upper extremity venous Doppler.  Patient is aware to go to the ED if he has worsening shortness of breath than usual, chest pain or coughing up blood.

## 2022-11-15 ENCOUNTER — Other Ambulatory Visit: Payer: Self-pay

## 2022-11-15 ENCOUNTER — Telehealth: Payer: Self-pay | Admitting: Cardiology

## 2022-11-15 ENCOUNTER — Emergency Department (HOSPITAL_BASED_OUTPATIENT_CLINIC_OR_DEPARTMENT_OTHER): Payer: Medicare Other

## 2022-11-15 ENCOUNTER — Emergency Department (HOSPITAL_BASED_OUTPATIENT_CLINIC_OR_DEPARTMENT_OTHER)
Admission: EM | Admit: 2022-11-15 | Discharge: 2022-11-15 | Disposition: A | Discharge status: Home / Self Care | Payer: Medicare Other | Attending: Emergency Medicine | Admitting: Emergency Medicine

## 2022-11-15 DIAGNOSIS — I509 Heart failure, unspecified: Secondary | ICD-10-CM | POA: Diagnosis not present

## 2022-11-15 DIAGNOSIS — Z79899 Other long term (current) drug therapy: Secondary | ICD-10-CM | POA: Diagnosis not present

## 2022-11-15 DIAGNOSIS — Z7951 Long term (current) use of inhaled steroids: Secondary | ICD-10-CM | POA: Diagnosis not present

## 2022-11-15 DIAGNOSIS — I110 Hypertensive heart disease with heart failure: Secondary | ICD-10-CM | POA: Insufficient documentation

## 2022-11-15 DIAGNOSIS — M79622 Pain in left upper arm: Secondary | ICD-10-CM | POA: Diagnosis not present

## 2022-11-15 DIAGNOSIS — J449 Chronic obstructive pulmonary disease, unspecified: Secondary | ICD-10-CM | POA: Diagnosis not present

## 2022-11-15 DIAGNOSIS — M7989 Other specified soft tissue disorders: Secondary | ICD-10-CM | POA: Diagnosis not present

## 2022-11-15 DIAGNOSIS — Z950 Presence of cardiac pacemaker: Secondary | ICD-10-CM | POA: Diagnosis not present

## 2022-11-15 DIAGNOSIS — R600 Localized edema: Secondary | ICD-10-CM | POA: Insufficient documentation

## 2022-11-15 LAB — COMPREHENSIVE METABOLIC PANEL
ALT: 14 U/L (ref 0–44)
AST: 15 U/L (ref 15–41)
Albumin: 4.3 g/dL (ref 3.5–5.0)
Alkaline Phosphatase: 57 U/L (ref 38–126)
Anion gap: 9 (ref 5–15)
BUN: 13 mg/dL (ref 8–23)
CO2: 31 mmol/L (ref 22–32)
Calcium: 9.1 mg/dL (ref 8.9–10.3)
Chloride: 100 mmol/L (ref 98–111)
Creatinine, Ser: 0.75 mg/dL (ref 0.61–1.24)
GFR, Estimated: >60 mL/min (ref >60)
Glucose, Bld: 139 mg/dL — ABNORMAL HIGH (ref 70–99)
Potassium: 3.5 mmol/L (ref 3.5–5.1)
Sodium: 140 mmol/L (ref 135–145)
Total Bilirubin: 1.1 mg/dL (ref 0.3–1.2)
Total Protein: 6.7 g/dL (ref 6.5–8.1)

## 2022-11-15 LAB — CBC WITH DIFFERENTIAL/PLATELET
Abs Immature Granulocytes: 0.05 10*3/uL (ref 0.00–0.07)
Basophils Absolute: 0 10*3/uL (ref 0.0–0.1)
Basophils Relative: 0 %
Eosinophils Absolute: 0.1 10*3/uL (ref 0.0–0.5)
Eosinophils Relative: 1 %
HCT: 44.9 % (ref 39.0–52.0)
Hemoglobin: 15.1 g/dL (ref 13.0–17.0)
Immature Granulocytes: 1 %
Lymphocytes Relative: 12 %
Lymphs Abs: 1 10*3/uL (ref 0.7–4.0)
MCH: 32.8 pg (ref 26.0–34.0)
MCHC: 33.6 g/dL (ref 30.0–36.0)
MCV: 97.6 fL (ref 80.0–100.0)
Monocytes Absolute: 0.7 10*3/uL (ref 0.1–1.0)
Monocytes Relative: 8 %
Neutro Abs: 6.5 10*3/uL (ref 1.7–7.7)
Neutrophils Relative %: 78 %
Platelets: 182 10*3/uL (ref 150–400)
RBC: 4.6 MIL/uL (ref 4.22–5.81)
RDW: 12.7 % (ref 11.5–15.5)
WBC: 8.4 10*3/uL (ref 4.0–10.5)
nRBC: 0 % (ref 0.0–0.2)

## 2022-11-15 LAB — LACTIC ACID, PLASMA: Lactic Acid, Venous: 1.7 mmol/L (ref 0.5–1.9)

## 2022-11-15 MED ORDER — NAPROXEN 500 MG PO TABS: 500.0000 mg | ORAL_TABLET | Freq: Two times a day (BID) | ORAL | 0 refills | Status: DC

## 2022-11-15 NOTE — ED Notes (Signed)
Pacemaker incision (pacer revision this past Wed.) remains taped closed with multiple steri strips, the area surrounding which looks non-problematic.

## 2022-11-15 NOTE — ED Notes (Signed)
He returns from u/s at this time. He remains ambulatory and in no distress.

## 2022-11-15 NOTE — ED Provider Notes (Signed)
[facility] DEPT Provider Note   CSN: [id number] Arrival date & time: 11/15/22  1037     History  Chief Complaint  Patient presents with   Arm Swelling    Left     Robert Church is a 66 y.o. male.  HPI   Patient with medical history of CHF, COPD, hypertension, hyperlipidemia, biventricular pacemaker changed out 1 week ago presents to the emergency department due to left upper extremity pain and swelling.  Patient's pacemaker changed was 5 days ago, started having swelling in his upper extremity 2 days later.  Started the elbow moving down to the lower extremity.  There are some pain along the elbow and at the site where the IV was the antecubital fossa.  Denies pain elsewhere.  No paresthesias, chest pain or worsening shortness of breath.  States he called his cardiologist who advised that the pain and swelling does not get better to have a DVT study performed.  Patient is not on any blood thinners.  Home Medications Prior to Admission medications   Medication Sig Start Date End Date Taking? Authorizing Provider  naproxen (NAPROSYN) 500 MG tablet Take 1 tablet (500 mg total) by mouth 2 (two) times daily. 11/15/22  Yes Sherrill Raring, PA-C  albuterol (PROVENTIL) (2.5 MG/3ML) 0.083% nebulizer solution Take 3 mLs (2.5 mg total) by nebulization every 4 (four) hours as needed for wheezing or shortness of breath. 08/20/21   Collene Gobble, MD  albuterol (VENTOLIN HFA) 108 (90 Base) MCG/ACT inhaler INHALE 2 PUFFS INTO THE LUNGS EVERY 6 HOURS AS NEEDED FOR WHEEZING 08/10/22   Byrum, Rose Fillers, MD  allopurinol (ZYLOPRIM) 300 MG tablet Take 1 tablet (300 mg total) by mouth daily. Patient taking differently: Take 300 mg by mouth as needed. 09/15/22   Janith Lima, MD  ALPRAZolam Duanne Moron) 0.5 MG tablet TAKE 1 TABLET(0.5 MG) BY MOUTH THREE TIMES DAILY AS NEEDED FOR ANXIETY 10/19/22   Janith Lima, MD  AMBULATORY NON FORMULARY MEDICATION Medication Name: Nitroglycerin gel  0.125% apply 2-3 times daily to the anal canal. Patient taking differently: Place 1 application  rectally in the morning, at noon, and at bedtime. Medication Name: Nitroglycerin gel 0.125% apply 2-3 times daily to the anal canal. 01/08/22   Zehr, Laban Emperor, PA-C  Budeson-Glycopyrrol-Formoterol (BREZTRI AEROSPHERE) 160-9-4.8 MCG/ACT AERO Inhale 2 puffs into the lungs in the morning and at bedtime. 10/29/22   Collene Gobble, MD  cholecalciferol (VITAMIN D3) 25 MCG (1000 UNIT) tablet Take 1,000 Units by mouth daily.    [provider]  colchicine 0.6 MG tablet Take 0.6 mg by mouth daily as needed (gout pain).    [provider]  EPINEPHrine 0.3 mg/0.3 mL IJ SOAJ injection Inject 0.3 mg into the muscle as needed for anaphylaxis. 07/13/22   Janith Lima, MD  fluticasone (FLONASE) 50 MCG/ACT nasal spray SHAKE LIQUID AND USE 2 SPRAYS IN EACH NOSTRIL DAILY Patient taking differently: Place 2 sprays into both nostrils daily as needed for allergies. 06/29/22   Collene Gobble, MD  guaiFENesin (MUCINEX) 600 MG 12 hr tablet Take 1 tablet (600 mg total) by mouth 2 (two) times daily. Patient taking differently: Take 600 mg by mouth daily. 12/17/21   Collene Gobble, MD  ipratropium (ATROVENT) 0.06 % nasal spray Place 2 sprays into both nostrils 4 (four) times daily. 10/29/22   Collene Gobble, MD  lidocaine (XYLOCAINE) 5 % ointment Apply topically 3 (three) times daily as needed. 09/15/22  Janith Lima, MD  Lidocaine, Anorectal, 5 % CREA Apply 1 application topically 2 (two) times daily. Patient taking differently: Apply 1 application  topically as needed. 01/08/22   Zehr, Laban Emperor, PA-C  losartan (COZAAR) 25 MG tablet Take 0.5 tablets (12.5 mg total) by mouth daily. 09/10/22   Bensimhon, Shaune Pascal, MD  mometasone-formoterol (DULERA) 100-5 MCG/ACT AERO Inhale 2 puffs into the lungs 2 (two) times daily. 09/11/22   Collene Gobble, MD  montelukast (SINGULAIR) 10 MG tablet TAKE 1 TABLET BY MOUTH  EVERYDAY AT BEDTIME 10/19/22   Byrum, Rose Fillers, MD  nicotine (NICODERM CQ - DOSED IN MG/24 HOURS) 14 mg/24hr patch Place 1 patch (14 mg total) onto the skin daily. 05/22/22   Collene Gobble, MD  omeprazole (PRILOSEC) 20 MG capsule Take 1 capsule (20 mg total) by mouth 2 (two) times daily before a meal. ACID REFLUX 04/27/22   Janith Lima, MD  Pitavastatin Calcium 2 MG TABS Take 1 tablet (2 mg total) by mouth daily. 09/15/22   Janith Lima, MD  polyethylene glycol (MIRALAX / GLYCOLAX) 17 g packet Take 17 g by mouth daily as needed. 12/30/20   Oretha Milch D, MD  potassium chloride SA (KLOR-CON M20) 20 MEQ tablet Take 1 tablet (20 mEq total) by mouth daily. 10/20/21 02/26/23  Bensimhon, Shaune Pascal, MD  predniSONE (DELTASONE) 10 MG tablet TAKE 1 TABLET(10 MG) BY MOUTH DAILY WITH BREAKFAST 06/12/22   Collene Gobble, MD  Testosterone 20.25 MG/1.25GM (1.62%) GEL Place 1.25 g onto the skin daily. 06/29/22   Janith Lima, MD  Tiotropium Bromide Monohydrate (SPIRIVA RESPIMAT) 2.5 MCG/ACT AERS Inhale 2 puffs into the lungs daily. 02/10/22   Parrett, Fonnie Mu, NP  torsemide (DEMADEX) 20 MG tablet Take 20 mg by mouth daily.    [provider]  traMADol (ULTRAM) 50 MG tablet Take 1 tablet (50 mg total) by mouth every 8 (eight) hours as needed. 02/17/22   Janith Lima, MD  vitamin B-12 (CYANOCOBALAMIN) 1000 MCG tablet Take 1,000 mcg by mouth daily.    [provider]      Allergies    Spironolactone, Daliresp [roflumilast], Jardiance [empagliflozin], Penicillins, Clindamycin, Doxycycline, E-mycin [erythromycin base], and Rosuvastatin    Review of Systems   Review of Systems  Physical Exam Updated Vital Signs BP (!) 144/82 (BP Location: Right Arm)   Pulse 90   Temp (!) 97.5 F (36.4 C) (Oral)   Resp 19   Ht '5\' 10"'$  (1.778 m)   Wt 106.6 kg   SpO2 98%   BMI 33.72 kg/m  Physical Exam Vitals and nursing note reviewed.  Constitutional:      General: He is not in acute distress.     Appearance: He is well-developed.  HENT:     Head: Normocephalic and atraumatic.  Eyes:     Conjunctiva/sclera: Conjunctivae normal.  Cardiovascular:     Rate and Rhythm: Normal rate and regular rhythm.     Pulses: Normal pulses.  Pulmonary:     Effort: Pulmonary effort is normal. No respiratory distress.     Breath sounds: Normal breath sounds.  Abdominal:     Palpations: Abdomen is soft.     Tenderness: There is no abdominal tenderness.  Musculoskeletal:        General: Swelling and tenderness present.     Cervical back: Neck supple.     Comments: Edema to left upper extremity worse in the dorsum of the left hand.  Able  to make a fist, complete ROM to elbow.  Not appreciate any crepitus.  Skin:    General: Skin is warm and dry.     Capillary Refill: Capillary refill takes less than 2 seconds.     Findings: No erythema.  Neurological:     Mental Status: He is alert.  Psychiatric:        Mood and Affect: Mood normal.     ED Results / Procedures / Treatments   Labs (all labs ordered are listed, but only abnormal results are displayed) Labs Reviewed  COMPREHENSIVE METABOLIC PANEL - Abnormal; Notable for the following components:      Result Value   Glucose, Bld 139 (*)    All other components within normal limits  LACTIC ACID, PLASMA  CBC WITH DIFFERENTIAL/PLATELET    EKG None  Radiology US Venous Img Upper Uni Left  Result Date: 11/15/2022 CLINICAL DATA:  Acute left upper extremity swelling and pain status post pacemaker revision. EXAM: Left UPPER EXTREMITY VENOUS DOPPLER ULTRASOUND TECHNIQUE: Gray-scale sonography with graded compression, as well as color Doppler and duplex ultrasound were performed to evaluate the upper extremity deep venous system from the level of the subclavian vein and including the jugular, axillary, basilic, radial, ulnar and upper cephalic vein. Spectral Doppler was utilized to evaluate flow at rest and with distal augmentation maneuvers.  However, exam is limited due to patient not being able to abduct arm. COMPARISON:  None Available. FINDINGS: Contralateral Subclavian Vein: Respiratory phasicity is normal and symmetric with the symptomatic side. No evidence of thrombus. Normal compressibility. Internal Jugular Vein: No evidence of thrombus. Normal compressibility, respiratory phasicity and response to augmentation. Subclavian Vein: Evaluation limited due to arm not being abducted. No evidence of thrombus. Normal compressibility, respiratory phasicity and response to augmentation. Axillary Vein: Evaluation limited due to arm not being abducted. No evidence of thrombus. Normal compressibility, respiratory phasicity and response to augmentation. Cephalic Vein: No evidence of thrombus. Normal compressibility, respiratory phasicity and response to augmentation. Basilic Vein: Evaluation limited due to arm not being abducted. No evidence of thrombus. Normal compressibility, respiratory phasicity and response to augmentation. Brachial Veins: No evidence of thrombus. Normal compressibility, respiratory phasicity and response to augmentation. Radial Veins: No evidence of thrombus. Normal compressibility, respiratory phasicity and response to augmentation. Ulnar Veins: No evidence of thrombus. Normal compressibility, respiratory phasicity and response to augmentation. Venous Reflux:  None visualized. Other Findings:  None visualized. IMPRESSION: No definite evidence of DVT within the left upper extremity, although this is exam is limited as the arm could not be abducted. Electronically Signed   By: Marijo Conception M.D.   On: 11/15/2022 14:41    Procedures Procedures    Medications Ordered in ED Medications - No data to display  ED Course/ Medical Decision Making/ A&P                           Medical Decision Making Amount and/or Complexity of Data Reviewed Labs: ordered.  Risk Prescription drug management.   This is a 66 year old male  presenting to the emergency department due to left upper extremity pain and swelling after generator change about a week ago.  Differential includes not limited to decreased venous return, DVT, cellulitis, ischemic limb, sepsis.  No chest pain or shortness of breath I do not think he has a PE.  Additionally not hypoxic, not tachycardic.  I viewed cardiology notes from telephone encounter.  On exam patient is neurovascular  intact with brisk cap refill, radial pulse 2+.  His upper extremity is notably edematous, is not warm or erythematous I do not think there is an overlying cellulitis.  He tolerates ROM of the elbow, wrist, digits.  I ordered ultrasound of the upper extremity which is negative for DVT although some limitations due to limitations to his ROM after the procedure.    Laboratory workup was ordered in triage and viewed and interpreted by me. No leukocytosis or anemia, patient's lactic acid is within normal limits, no gross electrolyte derangement.   I discussed results with the patient.  Will have him follow-up with cardiologist as scheduled on December 5.  He will follow-up with his primary regarding the upper extremity swelling this week for recheck.  Encouraged Tylenol, Nsaids and compression when able to on 28th when postop restrictions are lifted.  Case was discussed with my attending who agrees with the plan.        Final Clinical Impression(s) / ED Diagnoses Final diagnoses:  Edema of left upper extremity    Rx / DC Orders ED Discharge Orders          Ordered    naproxen (NAPROSYN) 500 MG tablet  2 times daily        11/15/22 1509              Sherrill Raring, PA-C 11/15/22 2112    Regan Lemming, MD 11/16/22 1227

## 2022-11-15 NOTE — ED Triage Notes (Addendum)
Patient arrives  ambulatory to triage with complaints left arm swelling x2 days. Patient states that he had his Pacemaker replaced 5 days ago and the swelling started a few days after in his left arm.   Reached out to his doctor who recommended a ultrasound later in the week to rule out clots, but the patient reports increased pain.

## 2022-11-15 NOTE — Discharge Instructions (Signed)
You are seen today in the emergency department due to swelling of your left upper extremity.  The ultrasound did not show any blood clot which is reassuring, for still having worsening pain and swelling it is reasonable to repeat in a week to confirm negative.  Follow-up with your cardiologist as planned, call your primary and schedule follow-up this week for reevaluation.  Take the Tylenol 1000 mg up to 3 times daily.  He can also take the Naprosyn 500 mg twice daily with food and water.  Return to the ED for fevers, inability to move the limb, severe pain, chest pain or shortness of breath.

## 2022-11-15 NOTE — Telephone Encounter (Signed)
Pt did go to ER and neg for DVT - hand is very swollen,  pt called to see how high he could raise the arm but he did not answer the phone. I left message from elbow down he could raise hand and to use ACE wrap on hand.  If more questions asked to call back.. Dr Curt Bears aware.  WBC normal lytes normal.    Triage please check on pt Monday the 27th

## 2022-11-15 NOTE — Telephone Encounter (Signed)
Pt called due to increased pain in elbow and hand of pacer side.  He cannot bend elbow.  He does have gout in his foot.  Concern for DVT, gout or infection, he did have IV in that hand.  Pain kept him up most of night.  He will go to ER [facility] to be evaluated.  He is agreeable.

## 2022-11-16 ENCOUNTER — Telehealth: Payer: Self-pay

## 2022-11-16 NOTE — Telephone Encounter (Signed)
Follow-up after same day discharge: Implant date: 11/11/2022 MD: Allegra Lai, MD Device: S[facility] BIV PPM Location: left chest  Wound check visit: 11/25/2022 at 10:40 am 90 day MD follow-up: scheduled  Remote Transmission received: not since discharge-scheduled transmission for 11/17/2022  Dressing/sling removed: yes  Outreach made to Pt.  Pt has contacted office previously d/t left arm swelling.  Advised Pt he could keep his arm propped up to shoulder length on the left side.  He states his elbow is most painful and swollen.  Advised to continue to monitor and reach out to device clinic if swelling seems to be worsening.  He is agreeable.    Confirmed wound check appointment.

## 2022-11-16 NOTE — Telephone Encounter (Signed)
-----   Message from [place], [place] sent at 11/11/2022  5:22 PM EST ----- Same day d/c  [facility] PPM upgrade to CRT-P  WC  No a/c

## 2022-11-16 NOTE — Telephone Encounter (Addendum)
Spoke with pt, he has been to the hospital, no DVT. He has started moving his arm up and down but has not seen much of a difference in the swelling.

## 2022-11-16 NOTE — Telephone Encounter (Signed)
Pt returning call

## 2022-11-16 NOTE — Telephone Encounter (Signed)
Left message for patient to call back  

## 2022-11-16 NOTE — Telephone Encounter (Signed)
Outreach made to Pt.  See same day discharge phone note.

## 2022-11-16 NOTE — Telephone Encounter (Signed)
Called patient left message on personal voice mail to call back to schedule appointment with APP.

## 2022-11-16 NOTE — Telephone Encounter (Signed)
Pt returned call, wanted to speak to RN before scheduling

## 2022-11-17 ENCOUNTER — Telehealth (HOSPITAL_BASED_OUTPATIENT_CLINIC_OR_DEPARTMENT_OTHER): Payer: Self-pay

## 2022-11-17 ENCOUNTER — Encounter (HOSPITAL_BASED_OUTPATIENT_CLINIC_OR_DEPARTMENT_OTHER): Payer: Self-pay | Admitting: Family

## 2022-11-17 ENCOUNTER — Ambulatory Visit (INDEPENDENT_AMBULATORY_CARE_PROVIDER_SITE_OTHER): Payer: Medicare Other

## 2022-11-17 ENCOUNTER — Ambulatory Visit (INDEPENDENT_AMBULATORY_CARE_PROVIDER_SITE_OTHER): Payer: Medicare Other | Admitting: Family

## 2022-11-17 VITALS — BP 132/80 | HR 90 | Ht 70.0 in | Wt 236.0 lb

## 2022-11-17 DIAGNOSIS — I442 Atrioventricular block, complete: Secondary | ICD-10-CM

## 2022-11-17 DIAGNOSIS — I10 Essential (primary) hypertension: Secondary | ICD-10-CM | POA: Diagnosis not present

## 2022-11-17 DIAGNOSIS — I5042 Chronic combined systolic (congestive) and diastolic (congestive) heart failure: Secondary | ICD-10-CM | POA: Diagnosis not present

## 2022-11-17 DIAGNOSIS — J449 Chronic obstructive pulmonary disease, unspecified: Secondary | ICD-10-CM

## 2022-11-17 DIAGNOSIS — G4733 Obstructive sleep apnea (adult) (pediatric): Secondary | ICD-10-CM

## 2022-11-17 DIAGNOSIS — M7989 Other specified soft tissue disorders: Secondary | ICD-10-CM

## 2022-11-17 DIAGNOSIS — R0989 Other specified symptoms and signs involving the circulatory and respiratory systems: Secondary | ICD-10-CM

## 2022-11-17 MED ORDER — APIXABAN 5 MG PO TABS: 5.0000 mg | ORAL_TABLET | Freq: Two times a day (BID) | ORAL | 3 refills | Status: DC

## 2022-11-17 NOTE — Progress Notes (Signed)
Office Visit    Patient Name: Robert Church Date of Encounter: 11/17/2022  PCP:  Janith Lima, MD   [facility]  Cardiologist:  Robert Burow, MD  Advanced Practice Provider:  No care team member to display Electrophysiologist:  Robert Meredith Leeds, MD   Chief Complaint    GAY RAPE is a 66 y.o. male presents today for left hand swelling   Past Medical History    Past Medical History:  Diagnosis Date   Adenomatous colon polyp    Allergy    Anal fissure    Arthritis    Asthma    Bronchitis    CHF (congestive heart failure) ([place])    COPD, group D, by GOLD 2017 classification ([place])    Dyspnea    Emphysema lung ([place])    GERD (gastroesophageal reflux disease)    History of hiatal hernia    Hyperlipidemia    Hypertension    NICM (nonischemic cardiomyopathy) (H[facility])    OSA (obstructive sleep apnea)    Pneumonia    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   Requires supplemental oxygen    Sinusitis    SOB (shortness of breath)    Past Surgical History:  Procedure Laterality Date   BIV UPGRADE N/A 10/07/2021   Procedure: BIV PPM UPGRADE;  Surgeon: Constance Haw, MD;  Location: [facility];  Service: Cardiovascular;  Laterality: N/A;   BIV UPGRADE N/A 11/11/2022   Procedure: BIV PPM UPGRADE;  Surgeon: Constance Haw, MD;  Location: [facility];  Service: Cardiovascular;  Laterality: N/A;   LEFT HEART CATH AND CORONARY ANGIOGRAPHY N/A 04/19/2017   Procedure: Left Heart Cath and Coronary Angiography;  Surgeon: Belva Crome, MD;  Location: [facility];  Service: Cardiovascular;  Laterality: N/A;   PACEMAKER IMPLANT N/A 04/16/2021   Procedure: PACEMAKER IMPLANT;  Surgeon: Constance Haw, MD;  Location: [facility];  Service: Cardiovascular;  Laterality: N/A;   RIGHT/LEFT HEART CATH AND CORONARY ANGIOGRAPHY N/A 08/22/2021   Procedure: RIGHT/LEFT HEART CATH AND CORONARY ANGIOGRAPHY;  Surgeon:  Jolaine Artist, MD;  Location: [facility];  Service: Cardiovascular;  Laterality: N/A;   TONSILLECTOMY      Allergies  Allergies  Allergen Reactions   Spironolactone Anaphylaxis   Daliresp [Roflumilast] Other (See Comments)    Dizzy, headache, leg pain per patient. 02/25/22.   Need to take in smaller doses    Jardiance [Empagliflozin] Nausea Only and Other (See Comments)    Lightheadness Dizziness   Penicillins Swelling and Other (See Comments)    Childhood rxn--MD stated he "almost died" Has patient had a PCN reaction causing immediate rash, facial/tongue/throat swelling, SOB or lightheadedness with hypotension:Yes Has patient had a PCN reaction causing severe rash involving mucus membranes or skin necrosis:unsure Has patient had a PCN reaction that required hospitalization:unsure Has patient had a PCN reaction occurring within the last 10 years:No If all of the above answers are "NO", then may proceed with Cephalosporin use.     Clindamycin Other (See Comments)    Tongue swelling   Doxycycline Nausea And Vomiting    Severe stomach upset per patient   E-Mycin [Erythromycin Base] Swelling   Rosuvastatin Other (See Comments)    Myalgias (intolerance)    History of Present Illness    Robert Church is a 66 y.o. male with a hx of NICM, COPD, OSA on BIPAP, prior tobacco  use, obesity, HTN, HLD, HFrEF last seen for PPM upgrade 11/11/22. Follows with Dr. Curt Church of EP, Dr. Gwenlyn Church of general cardiology, as well as Dr. Haroldine Church of [facility].   Cardiac catheterization 03/2017 normal coronary arteries EF 40%. Nuclear stress testing 03/2018 EF 38%, inferior scar and septal ischemia. Seen by Dr. Haroldine Church 04/09/22 after admission with COPD and HF exacerbation. Jardianc eand Spironolcatone added and doses adjusted due to intolerances.   Presented 03/2021 with CHB and s/p St. Jude dual chamber PPM 04/16/21. R/LHC 08/22/21 with normal coronary arteries, LVEF 30-35%.  He is s/p Barostim 01/2022. He was recommended for updated PPM which was performed 11/11/22 by Dr. Curt Church.  ED visit 11/15/22 with [place] with no LUE DVT however exam limited by edema.   He presents today for follow up. Notes swelling of left hand through elbow since Saturday. He did catch his skin on the clip for ACE bandage within the last 48 hours and has weeping of clear fluid since that time. Earlier today he scratched his arm by mistake with expected bleeding.   Notes congested cough x 4 months. Has not taken Muccinex recently and encouraged to utilize. Follows with pulmonology.   Notes he previously was down to 219lbs and has gained weight. Has been taking Torsemide '20mg'$  daily as prescribed. No orthopnea, PND. Bilateral LE edema slightly progressed from his baseline.   No chest pain, pressure, tightness.   EKGs/Labs/Other Studies Reviewed:   The following studies were reviewed today:   EKG:  EKG is not ordered today.    Recent Labs: 06/19/2022: B Natriuretic Peptide 159.1 09/14/2022: Pro B Natriuretic peptide (BNP) 167.0 11/15/2022: ALT 14; BUN 13; Creatinine, Ser 0.75; Hemoglobin 15.1; Platelets 182; Potassium 3.5; Sodium 140  Recent Lipid Panel    Component Value Date/Time   CHOL 222 (H) 09/15/2022 1330   TRIG 257.0 (H) 09/15/2022 1330   HDL 51.90 09/15/2022 1330   CHOLHDL 4 09/15/2022 1330   VLDL 51.4 (H) 09/15/2022 1330   LDLCALC 117 (H) 10/20/2021 1406   LDLCALC 108 (H) 07/10/2020 1035   LDLDIRECT 157.0 09/15/2022 1330   Home Medications   Current Meds  Medication Sig   albuterol (PROVENTIL) (2.5 MG/3ML) 0.083% nebulizer solution Take 3 mLs (2.5 mg total) by nebulization every 4 (four) hours as needed for wheezing or shortness of breath.   albuterol (VENTOLIN HFA) 108 (90 Base) MCG/ACT inhaler INHALE 2 PUFFS INTO THE LUNGS EVERY 6 HOURS AS NEEDED FOR WHEEZING   allopurinol (ZYLOPRIM) 300 MG tablet Take 1 tablet (300 mg total) by mouth daily. (Patient taking differently:  Take 300 mg by mouth as needed.)   ALPRAZolam (XANAX) 0.5 MG tablet TAKE 1 TABLET(0.5 MG) BY MOUTH THREE TIMES DAILY AS NEEDED FOR ANXIETY   AMBULATORY NON FORMULARY MEDICATION Medication Name: Nitroglycerin gel 0.125% apply 2-3 times daily to the anal canal. (Patient taking differently: Place 1 application  rectally in the morning, at noon, and at bedtime. Medication Name: Nitroglycerin gel 0.125% apply 2-3 times daily to the anal canal.)   Budeson-Glycopyrrol-Formoterol (BREZTRI AEROSPHERE) 160-9-4.8 MCG/ACT AERO Inhale 2 puffs into the lungs in the morning and at bedtime.   cholecalciferol (VITAMIN D3) 25 MCG (1000 UNIT) tablet Take 1,000 Units by mouth daily.   colchicine 0.6 MG tablet Take 0.6 mg by mouth daily as needed (gout pain).   EPINEPHrine 0.3 mg/0.3 mL IJ SOAJ injection Inject 0.3 mg into the muscle as needed for anaphylaxis.   fluticasone (FLONASE) 50 MCG/ACT nasal spray SHAKE LIQUID  AND USE 2 SPRAYS IN EACH NOSTRIL DAILY (Patient taking differently: Place 2 sprays into both nostrils daily as needed for allergies.)   guaiFENesin (MUCINEX) 600 MG 12 hr tablet Take 1 tablet (600 mg total) by mouth 2 (two) times daily. (Patient taking differently: Take 600 mg by mouth daily.)   ipratropium (ATROVENT) 0.06 % nasal spray Place 2 sprays into both nostrils 4 (four) times daily.   lidocaine (XYLOCAINE) 5 % ointment Apply topically 3 (three) times daily as needed.   Lidocaine, Anorectal, 5 % CREA Apply 1 application topically 2 (two) times daily. (Patient taking differently: Apply 1 application  topically as needed.)   losartan (COZAAR) 25 MG tablet Take 0.5 tablets (12.5 mg total) by mouth daily.   mometasone-formoterol (DULERA) 100-5 MCG/ACT AERO Inhale 2 puffs into the lungs 2 (two) times daily.   montelukast (SINGULAIR) 10 MG tablet TAKE 1 TABLET BY MOUTH EVERYDAY AT BEDTIME   naproxen (NAPROSYN) 500 MG tablet Take 1 tablet (500 mg total) by mouth 2 (two) times daily.   nicotine (NICODERM  CQ - DOSED IN MG/24 HOURS) 14 mg/24hr patch Place 1 patch (14 mg total) onto the skin daily.   omeprazole (PRILOSEC) 20 MG capsule Take 1 capsule (20 mg total) by mouth 2 (two) times daily before a meal. ACID REFLUX   Pitavastatin Calcium 2 MG TABS Take 1 tablet (2 mg total) by mouth daily.   polyethylene glycol (MIRALAX / GLYCOLAX) 17 g packet Take 17 g by mouth daily as needed.   potassium chloride SA (KLOR-CON M20) 20 MEQ tablet Take 1 tablet (20 mEq total) by mouth daily.   predniSONE (DELTASONE) 10 MG tablet TAKE 1 TABLET(10 MG) BY MOUTH DAILY WITH BREAKFAST   Testosterone 20.25 MG/1.25GM (1.62%) GEL Place 1.25 g onto the skin daily.   Tiotropium Bromide Monohydrate (SPIRIVA RESPIMAT) 2.5 MCG/ACT AERS Inhale 2 puffs into the lungs daily.   torsemide (DEMADEX) 20 MG tablet Take 20 mg by mouth daily.   traMADol (ULTRAM) 50 MG tablet Take 1 tablet (50 mg total) by mouth every 8 (eight) hours as needed.   vitamin B-12 (CYANOCOBALAMIN) 1000 MCG tablet Take 1,000 mcg by mouth daily.     Review of Systems      All other systems reviewed and are otherwise negative except as noted above.  Physical Exam    VS:  BP 132/80   Pulse 90   Ht '5\' 10"'$  (1.778 m)   Wt 236 lb (107 kg)   BMI 33.86 kg/m  , BMI Body mass index is 33.86 kg/m.  Wt Readings from Last 3 Encounters:  11/17/22 236 lb (107 kg)  11/15/22 235 lb (106.6 kg)  11/11/22 235 lb (106.6 kg)    GEN: Well nourished, well developed, in no acute distress. HEENT: normal. Neck: Supple, no JVD, carotid bruits, or masses. Cardiac: RRR, no murmurs, rubs, or gallops. No clubbing, cyanosis. Bilateral pedal nonpitting edema. L hand with 3+ pitting edema. LUE with edema hand to elbow, weeping clear fluid. (See photo) Radials/PT 2+ and equal bilaterally.  Respiratory:  Respirations regular and unlabored, clear to auscultation bilaterally. GI: Soft, nontender, nondistended. MS: No deformity or atrophy. Skin: Warm and dry, no rash. Neuro:   Strength and sensation are intact. Psych: Normal affect.    Assessment & Plan    LUE swelling / Suspected DVT - s/p PPM device upgrade 11/11/22 with swelling from right hand to elbow beginning 11/13/22. Has had weeping for the last 1-2 days of clear fluid. ROM  limited by swelling. No change in color, temperature. No evidence of infection. ED visit with LUE duplex no DVT but limited by edema. \ Per Dr. Curt Church previous recommendation, Rx Eliquis '5mg'$  BID. Could consider repeat duplex when edema improved to re-assess for DVT and help determine length of [place].  Increase Torsemide to '40mg'$  QD x 2 days then return to '20mg'$  QD. Reviewed instructions to elevate.  Arm wrapped in gauze with additional gauze squares over skin abrasion site. Gauze wrap covered with coband for compression as ACE not available in clinic. Extra supplies provided for home use. He Robert return Thursday for nurse visit to reassess and re-dress the arm.   S/p PPM - Follow up with device clinic for wound check and Dr. Curt Church as scheduled. Steri strips over PPM site clean, dry, intact with no exudate nor erythema.   HFrEF - GDMT limited by intolerances Delene Loll, Jardiance, Bisoprolol, Carvedilol, Metoprolol). Increased LE edema recently. No orthopnea, PND. Increase Torsemide to '40mg'$  QD x 2 days then return to '20mg'$  QD. Continue Losartan 12.'5mg'$  QD. Low sodium diet, fluid restriction <2L, and daily weights encouraged. Educated to contact our office for weight gain of 2 lbs overnight or 5 lbs in one week.  Due for follow up with echocardiogram with Dr. Haroldine Church, Encouraged him to schedule.   COPD - Follows with pulmonary. No signs of acute exacerbation.   OSA - Endorses compliance with BIPAP.  Obesity - Weight loss via diet and exercise encouraged. Discussed the impact being overweight would have on cardiovascular risk. Previously declined GLP1.        Disposition: Follow up as scheduled with device clinic and Dr. Curt Church. Encouraged  to schedule follow up with Dr. Haroldine Church in December or January.  Signed, Loel Dubonnet, NP 11/17/2022, 2:08 PM [place]

## 2022-11-17 NOTE — Telephone Encounter (Signed)
CALLED PHARM TO VERIFY NEED FOR PRIOR AUTH, PATIENT MEDICATION APPROVED, 141 FOR 90 DAY SUPPLY AND 47 30 DAY SUPPLY, PATIENT NOTIFIED.

## 2022-11-17 NOTE — Telephone Encounter (Signed)
Spoke with patient who reports that the ED told him to use an ACE wrap on his left arm to help resolve the edema. He stated he poked his arm with an ACE clip and has had copious amounts of clear drainage since. He denies fever/chills. He stated that when the "nurse started my IV, I think she went through my vein, but she told me she didn't." Made appointment with Vella Raring, NP for today.

## 2022-11-17 NOTE — Patient Instructions (Addendum)
Medication Instructions:  Your physician has recommended you make the following change in your medication:   Start: Eliquis '5mg'$  twice daily- Daphene Jaeger will work on your prior authorization for this medication and call you when it is time to go get from the pharmacy, please use your samples provided today until then.  Change: Torsemide '40mg'$  daily for two days then return back to '20mg'$  daily   *If you need a refill on your cardiac medications before your next appointment, please call your pharmacy*   Lab Work: Your physician recommends that you return for lab work on 12/6 at [facility] for [place].   If you have labs (blood work) drawn today and your tests are completely normal, you will receive your results only by: [place] (if you have MyChart) OR A paper copy in the mail If you have any lab test that is abnormal or we need to change your treatment, we will call you to review the results.  Follow-Up: At [facility], you and your health needs are our priority.  As part of our continuing mission to provide you with exceptional heart care, we have created designated Provider Care Teams.  These Care Teams include your primary Cardiologist (physician) and Advanced Practice Providers (APPs -  Physician Assistants and Nurse Practitioners) who all work together to provide you with the care you need, when you need it.  We recommend signing up for the patient portal called "MyChart".  Sign up information is provided on this After Visit Summary.  MyChart is used to connect with patients for Virtual Visits (Telemedicine).  Patients are able to view lab/test results, encounter notes, upcoming appointments, etc.  Non-urgent messages can be sent to your provider as well.   To learn more about what you can do with MyChart, go to [email or web address].    Your next appointment:   Follow up as scheduled with Device Clinic and Dr. Cora Daniels are due for follow up with Dr. Haroldine Laws, please call  them to schedule.   Please schedule a nurse visit for Thursday 11/30 at 1pm for a nurse visit to rebandage arm.

## 2022-11-19 ENCOUNTER — Ambulatory Visit (INDEPENDENT_AMBULATORY_CARE_PROVIDER_SITE_OTHER): Payer: Medicare Other

## 2022-11-19 DIAGNOSIS — I5042 Chronic combined systolic (congestive) and diastolic (congestive) heart failure: Secondary | ICD-10-CM

## 2022-11-19 DIAGNOSIS — M7989 Other specified soft tissue disorders: Secondary | ICD-10-CM

## 2022-11-19 DIAGNOSIS — R0609 Other forms of dyspnea: Secondary | ICD-10-CM

## 2022-11-19 MED ORDER — TORSEMIDE 20 MG PO TABS: 20.0000 mg | ORAL_TABLET | Freq: Every day | ORAL | 3 refills | Status: DC

## 2022-11-19 MED ORDER — POTASSIUM CHLORIDE CRYS ER 20 MEQ PO TBCR: 20.0000 meq | EXTENDED_RELEASE_TABLET | Freq: Every day | ORAL | 3 refills | Status: DC

## 2022-11-19 NOTE — Patient Instructions (Addendum)
Medication Instructions:  Your physician has recommended you make the following change in your medication:   Take Torsemide '40mg'$  (2 tablets) together in the morning for the next 3 days  Then return to '20mg'$  (1 tablet) daily _____________  May try Pepcid or IBGuard (peppermint) for indigestion   *If you need a refill on your cardiac medications before your next appointment, please call your pharmacy*   Lab Work: Your physician recommends that you return for lab work today: BMP, BNP, CBC  If you have labs (blood work) drawn today and your tests are completely normal, you will receive your results only by: MyChart Message (if you have [place]) OR A paper copy in the mail If you have any lab test that is abnormal or we need to change your treatment, we will call you to review the results.  Follow-Up: At [facility], you and your health needs are our priority.  As part of our continuing mission to provide you with exceptional heart care, we have created designated Provider Care Teams.  These Care Teams include your primary Cardiologist (physician) and Advanced Practice Providers (APPs -  Physician Assistants and Nurse Practitioners) who all work together to provide you with the care you need, when you need it.  We recommend signing up for the patient portal called "MyChart".  Sign up information is provided on this After Visit Summary.  MyChart is used to connect with patients for Virtual Visits (Telemedicine).  Patients are able to view lab/test results, encounter notes, upcoming appointments, etc.  Non-urgent messages can be sent to your provider as well.   To learn more about what you can do with MyChart, go to [email or web address].    Your next appointment:   As scheduled  Other Instructions  Recommend elevating your arm above the level of your heart  If your breathing gets worse over the weekend, recommend evaluation in the urgent care or emergency department

## 2022-11-19 NOTE — Progress Notes (Addendum)
   Nurse Visit   Date of Encounter: 11/19/2022 ID: Robert Church, DOB 12-29-1955, MRN [id number]  PCP:  Janith Lima, MD   [place] [facility] Cardiologist:  Quay Burow, MD Electrophysiologist:  Constance Haw, MD      Visit Details   VS:  There were no vitals taken for this visit. , BMI There is no height or weight on file to calculate BMI.  Wt Readings from Last 3 Encounters:  11/17/22 236 lb (107 kg)  11/15/22 235 lb (106.6 kg)  11/11/22 235 lb (106.6 kg)     Reason for visit: rebandage of arm due to weeping and swelling  Performed today:  Rebandage of arm, Provider consulted:Caitlin Walker,NP, and Education Changes (medications, testing, etc.) : Per Laurann Montana, NP "Take Torsemide '40mg'$  (2 tablets) together in the morning for the next 3 days; Then return to '20mg'$  (1 tablet) daily" Labs today- BMP, BNP, CBC  Length of Visit: 10 minutes    Signed, Gerald Stabs, RN  11/19/2022 1:06 PM

## 2022-11-20 ENCOUNTER — Telehealth: Payer: Self-pay | Admitting: Cardiology

## 2022-11-20 ENCOUNTER — Encounter (HOSPITAL_COMMUNITY): Payer: Self-pay | Admitting: Cardiology

## 2022-11-20 LAB — CBC
Hematocrit: 44.9 % (ref 37.5–51.0)
Hemoglobin: 15.2 g/dL (ref 13.0–17.7)
MCH: 32.6 pg (ref 26.6–33.0)
MCHC: 33.9 g/dL (ref 31.5–35.7)
MCV: 96 fL (ref 79–97)
Platelets: 223 10*3/uL (ref 150–450)
RBC: 4.66 x10E6/uL (ref 4.14–5.80)
RDW: 11.7 % (ref 11.6–15.4)
WBC: 8.4 10*3/uL (ref 3.4–10.8)

## 2022-11-20 LAB — BRAIN NATRIURETIC PEPTIDE: BNP: 161.8 pg/mL — ABNORMAL HIGH (ref 0.0–100.0)

## 2022-11-20 LAB — BASIC METABOLIC PANEL
BUN/Creatinine Ratio: 13 (ref 10–24)
BUN: 14 mg/dL (ref 8–27)
CO2: 27 mmol/L (ref 20–29)
Calcium: 9.2 mg/dL (ref 8.6–10.2)
Chloride: 99 mmol/L (ref 96–106)
Creatinine, Ser: 1.04 mg/dL (ref 0.76–1.27)
Glucose: 98 mg/dL (ref 70–99)
Potassium: 3.9 mmol/L (ref 3.5–5.2)
Sodium: 144 mmol/L (ref 134–144)
eGFR: 80 mL/min/{1.73_m2} (ref >59)

## 2022-11-20 NOTE — Telephone Encounter (Signed)
Pt reports a "flare up, like a bad dose of asthma". States he is having SOB & chest tightness for past couple of days, says chest pain comes and goes. Discussed w/ Dr. Curt Bears. Advised pt to go to ED for evaluation. Patient verbalized understanding and agreeable to plan.

## 2022-11-20 NOTE — Telephone Encounter (Signed)
Pt c/o of Chest Pain: STAT if CP now or developed within 24 hours  1. Are you having CP right now? No,  patient stated he is laying down  2. Are you experiencing any other symptoms (ex. SOB, nausea, vomiting, sweating)?   Yes  3. How long have you been experiencing CP? 2 days, swelling since Friday  4. Is your CP continuous or coming and going? Comes and goes  5. Have you taken Nitroglycerin?   No  Patient stated he is having some swelling in his arm and fluid discharge from his armpits. ?

## 2022-11-23 ENCOUNTER — Telehealth: Payer: Self-pay | Admitting: Cardiology

## 2022-11-23 NOTE — Telephone Encounter (Signed)
Returned call to patient who states the swelling in his arm has decreased slightly, however the weeping from the arm continues. Pt confirms he is taking Eliquis as prescribed, however he is experiencing headache and nausea he attributes to Eliquis. Pt reports episodic lightheadedness.  Pt is using multiple inhalers for asthma.  Explained to pt.headache and nausea would be unusual side effects of Eliquis.  Pt verbalized understanding and agreed to continue Eliquis.  Instructed patient to keep his arm elevated. clean, change dressing as needed.  Pt instructed to call if symptoms worsen.  Pt has an appt to see the device clinic on Wednesday December 6th.  Pt verbalized understanding of instructions as provided.  Georgana Curio MHA RN CCM

## 2022-11-23 NOTE — Telephone Encounter (Signed)
Pt c/o swelling: STAT is pt has developed SOB within 24 hours  If swelling, where is the swelling located? Left arm   How much weight have you gained and in what time span? Lost 7 lbs   Have you gained 3 pounds in a day or 5 pounds in a week? No   Do you have a log of your daily weights (if so, list)? No   Are you currently taking a fluid pill? Yes   Are you currently SOB? He states "I always am"   7. Have you traveled recently? No    Pt states he is still have some fluid discharge and swelling from arm, from Friday 11/20/22

## 2022-11-25 ENCOUNTER — Ambulatory Visit: Payer: Medicare Other

## 2022-11-25 ENCOUNTER — Ambulatory Visit: Payer: Medicare Other | Attending: Interventional Cardiology

## 2022-11-25 DIAGNOSIS — Z950 Presence of cardiac pacemaker: Secondary | ICD-10-CM

## 2022-11-25 DIAGNOSIS — I5042 Chronic combined systolic (congestive) and diastolic (congestive) heart failure: Secondary | ICD-10-CM | POA: Diagnosis not present

## 2022-11-25 LAB — CUP PACEART INCLINIC DEVICE CHECK
Battery Remaining Longevity: 88 mo
Battery Voltage: 2.96 V
Brady Statistic RA Percent Paced: 0.34 %
Brady Statistic RV Percent Paced: 97 %
Date Time Interrogation Session: 20231206120039
Implantable Lead Connection Status: 753985
Implantable Lead Connection Status: 753985
Implantable Lead Connection Status: 753985
Implantable Lead Implant Date: 20220427
Implantable Lead Implant Date: 20220427
Implantable Lead Implant Date: 20231122
Implantable Lead Location: 753858
Implantable Lead Location: 753859
Implantable Lead Location: 753860
Implantable Pulse Generator Implant Date: 20231122
Lead Channel Impedance Value: 462.5 Ohm
Lead Channel Impedance Value: 487.5 Ohm
Lead Channel Impedance Value: 612.5 Ohm
Lead Channel Pacing Threshold Amplitude: 0.5 V
Lead Channel Pacing Threshold Amplitude: 0.5 V
Lead Channel Pacing Threshold Amplitude: 0.75 V
Lead Channel Pacing Threshold Amplitude: 0.75 V
Lead Channel Pacing Threshold Amplitude: 0.75 V
Lead Channel Pacing Threshold Amplitude: 0.75 V
Lead Channel Pacing Threshold Pulse Width: 0.5 ms
Lead Channel Pacing Threshold Pulse Width: 0.5 ms
Lead Channel Pacing Threshold Pulse Width: 0.5 ms
Lead Channel Pacing Threshold Pulse Width: 0.5 ms
Lead Channel Pacing Threshold Pulse Width: 0.5 ms
Lead Channel Pacing Threshold Pulse Width: 0.5 ms
Lead Channel Sensing Intrinsic Amplitude: 12 mV
Lead Channel Sensing Intrinsic Amplitude: 2 mV
Lead Channel Setting Pacing Amplitude: 1.625
Lead Channel Setting Pacing Amplitude: 2 V
Lead Channel Setting Pacing Amplitude: 2 V
Lead Channel Setting Pacing Pulse Width: 0.5 ms
Lead Channel Setting Pacing Pulse Width: 0.5 ms
Lead Channel Setting Sensing Sensitivity: 2 mV
Pulse Gen Model: 3222
Pulse Gen Serial Number: 3979797

## 2022-11-25 NOTE — Patient Instructions (Signed)

## 2022-11-25 NOTE — Progress Notes (Signed)
Wound check appointment. Steri-strips removed. Wound without redness or edema. Incision edges approximated, wound well healed. Normal device function. Thresholds, sensing, and impedances consistent with implant measurements. Device programmed at 3.5V/auto capture programmed on for extra safety margin until 3 month visit. Histogram distribution appropriate for patient and level of activity. No mode switches or high ventricular rates noted. Patient educated about wound care, arm mobility, lifting restrictions. ROV in 3 months with implanting physician.  Pt continues to have swelling to left elbow and down to left hand.  Will discuss repeating U/S with Dr. Curt Bears.

## 2022-11-26 ENCOUNTER — Telehealth: Payer: Self-pay | Admitting: Emergency Medicine

## 2022-11-26 ENCOUNTER — Other Ambulatory Visit: Payer: Self-pay

## 2022-11-26 ENCOUNTER — Telehealth: Payer: Self-pay

## 2022-11-26 ENCOUNTER — Other Ambulatory Visit: Payer: Self-pay | Admitting: Adult Health

## 2022-11-26 ENCOUNTER — Other Ambulatory Visit: Payer: Self-pay | Admitting: Emergency Medicine

## 2022-11-26 ENCOUNTER — Encounter (HOSPITAL_COMMUNITY): Payer: Self-pay | Admitting: Cardiology

## 2022-11-26 DIAGNOSIS — M7989 Other specified soft tissue disorders: Secondary | ICD-10-CM

## 2022-11-26 DIAGNOSIS — J9610 Chronic respiratory failure, unspecified whether with hypoxia or hypercapnia: Secondary | ICD-10-CM | POA: Diagnosis not present

## 2022-11-26 DIAGNOSIS — J449 Chronic obstructive pulmonary disease, unspecified: Secondary | ICD-10-CM | POA: Diagnosis not present

## 2022-11-26 MED ORDER — ALBUTEROL SULFATE HFA 108 (90 BASE) MCG/ACT IN AERS: INHALATION_SPRAY | RESPIRATORY_TRACT | 4 refills | Status: DC

## 2022-11-26 MED ORDER — MONTELUKAST SODIUM 10 MG PO TABS: ORAL_TABLET | ORAL | 5 refills | Status: DC

## 2022-11-26 MED ORDER — SPIRIVA RESPIMAT 2.5 MCG/ACT IN AERS: 2.0000 | INHALATION_SPRAY | Freq: Every day | RESPIRATORY_TRACT | 4 refills | Status: DC

## 2022-11-26 MED ORDER — MOMETASONE FURO-FORMOTEROL FUM 100-5 MCG/ACT IN AERO: 2.0000 | INHALATION_SPRAY | Freq: Two times a day (BID) | RESPIRATORY_TRACT | 5 refills | Status: DC

## 2022-11-26 MED ORDER — ALBUTEROL SULFATE (2.5 MG/3ML) 0.083% IN NEBU: 2.5000 mg | INHALATION_SOLUTION | RESPIRATORY_TRACT | 11 refills | Status: DC | PRN

## 2022-11-26 NOTE — Telephone Encounter (Signed)
Discussed continued left arm swelling with Dr. Curt Bears.  Will order repeat U/S to reevaluate for DVT.

## 2022-11-27 ENCOUNTER — Other Ambulatory Visit: Payer: Self-pay | Admitting: Emergency Medicine

## 2022-11-29 DIAGNOSIS — R609 Edema, unspecified: Secondary | ICD-10-CM | POA: Diagnosis not present

## 2022-11-29 DIAGNOSIS — M7989 Other specified soft tissue disorders: Secondary | ICD-10-CM | POA: Diagnosis not present

## 2022-11-29 DIAGNOSIS — Z950 Presence of cardiac pacemaker: Secondary | ICD-10-CM | POA: Diagnosis not present

## 2022-11-30 NOTE — Telephone Encounter (Signed)
Pt scheduled for tomorrow morning

## 2022-11-30 NOTE — Telephone Encounter (Signed)
Will close encounter

## 2022-11-30 NOTE — Telephone Encounter (Signed)
Follow Up:     Patient is calling back. He wants to see if Anderson Malta can get him an earlier appointment for his arm please. He said he went to the Urgent Care yesterday.He said they wanted him to have his ultrasound sooner if possible please.

## 2022-12-01 ENCOUNTER — Ambulatory Visit (INDEPENDENT_AMBULATORY_CARE_PROVIDER_SITE_OTHER): Payer: Medicare Other

## 2022-12-01 ENCOUNTER — Telehealth: Payer: Self-pay | Admitting: Cardiology

## 2022-12-01 DIAGNOSIS — M7989 Other specified soft tissue disorders: Secondary | ICD-10-CM | POA: Diagnosis not present

## 2022-12-01 MED ORDER — APIXABAN 5 MG PO TABS: 10.0000 mg | ORAL_TABLET | Freq: Two times a day (BID) | ORAL | 3 refills | Status: DC

## 2022-12-01 NOTE — Telephone Encounter (Signed)
Call received in [facility] triage from [place], [place].  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 Pt on Eliquis and takes 5 mg Bid Daily.    Report taken to Robert Church, and Robert Church, in [place].  Robert Church wanted Pt to continue Eliquis therapy, but consult Pharm D for dosing.    Per Robert Church, RPH, CPP, Pharm-D order are to: Eliquis   Take 2 tablets (10 mg total) by mouth 2 (two) times daily. For ONE WEEK only, Starting 12/01/2022. Then, go back to taking: 1 tablet ( 5 mg total ) by mouth 2 (two) times daily.   Robert Church called back to make Pt aware of new medication orders, and to let Pt know he can go home.    Pt called to follow up, and go over Eliquis orders again with Pt. Pt verbalized understanding of new Eliquis orders, and educated on s/s that would indicate reasons for an ER visit.  Pt stated he was taking Eliquis 5 mg once a day.  Pt also requested a refill of Eliquis be sent to [facility] on [address] [place].      Follow up visited Robert Church and Robert Church to report Pt statement / Pharm-D orders.

## 2022-12-01 NOTE — Telephone Encounter (Signed)
Calling to give [place] report on pt. Call transferred

## 2022-12-02 ENCOUNTER — Telehealth: Payer: Self-pay | Admitting: Cardiology

## 2022-12-02 NOTE — Telephone Encounter (Signed)
Patient calling to see if he needs to do a follow up after seven days

## 2022-12-02 NOTE — Telephone Encounter (Signed)
Called patient back. Informed patient to continue with the eliqus 10 mg BID, for one week and then back to eliquis 5 mg BID. Patient wants to know if he needs to follow-up with anyone after a week on higher dose. Informed patient that right now he would not need an appointment and give [place] a call if his swelling does not go down with in the week. Will clarify with Dr. Carmelia Bake he needs to come in.

## 2022-12-03 ENCOUNTER — Encounter (HOSPITAL_COMMUNITY): Payer: Medicare Other

## 2022-12-07 NOTE — Telephone Encounter (Signed)
Pt advised : If arm is improving, no need for follow-up aside from normal device  follow-up.  Patient verbalized understanding and agreeable to plan.  Advised to call the office by the end of the week if no improvement.

## 2022-12-09 ENCOUNTER — Other Ambulatory Visit: Payer: Self-pay | Admitting: Emergency Medicine

## 2022-12-10 ENCOUNTER — Other Ambulatory Visit: Payer: Self-pay | Admitting: Emergency Medicine

## 2022-12-10 ENCOUNTER — Telehealth: Payer: Self-pay | Admitting: Emergency Medicine

## 2022-12-10 ENCOUNTER — Other Ambulatory Visit: Payer: Self-pay

## 2022-12-10 MED ORDER — ALBUTEROL SULFATE HFA 108 (90 BASE) MCG/ACT IN AERS: INHALATION_SPRAY | RESPIRATORY_TRACT | 4 refills | Status: DC

## 2022-12-10 MED ORDER — ROFLUMILAST 500 MCG PO TABS: 500.0000 ug | ORAL_TABLET | Freq: Every day | ORAL | 2 refills | Status: DC

## 2022-12-10 MED ORDER — SPIRIVA RESPIMAT 2.5 MCG/ACT IN AERS: 2.0000 | INHALATION_SPRAY | Freq: Every day | RESPIRATORY_TRACT | 4 refills | Status: DC

## 2022-12-10 MED ORDER — MOMETASONE FURO-FORMOTEROL FUM 100-5 MCG/ACT IN AERO: 2.0000 | INHALATION_SPRAY | Freq: Two times a day (BID) | RESPIRATORY_TRACT | 5 refills | Status: DC

## 2022-12-10 MED ORDER — PREDNISONE 10 MG PO TABS: 10.0000 mg | ORAL_TABLET | Freq: Every day | ORAL | 2 refills | Status: DC

## 2022-12-10 NOTE — Telephone Encounter (Signed)
Called patient back this morning and confirmed with him over the phone what medications he was needing refills on. He states his [place] will cost him $90 with his insurance and he is fine with that price. I confirmed with him his pharmacy while on the phone. Refills sent in. Nothing further needed

## 2022-12-10 NOTE — Telephone Encounter (Signed)
PT CB but Triage not avail. Pls call re: April's follow up call in this encounter.   W/regard to the [employer] he has been appvd and co-pay is 90.00 he said. [place] # [id number]

## 2022-12-10 NOTE — Telephone Encounter (Signed)
Last time he called he said he also asked for two RX one was missing Dalirept (500) and he also needs refills on [place] and Albuterol. Please call to confirm. No Generics, he said.  Pharmacy is [facility].   [phone]

## 2022-12-10 NOTE — Telephone Encounter (Signed)
Called patietn and left voicemail for him to call office back to confirm what medications he was needing refilled and to ask him if he was wanting the Daliresp given the $307 copay per our pharmacy team back from October.

## 2022-12-15 ENCOUNTER — Telehealth: Payer: Self-pay | Admitting: Cardiovascular Disease

## 2022-12-15 NOTE — Telephone Encounter (Signed)
He is being treated for a DVT so it is not advisable to d/c Eliquis.  Has he finished his week of '10mg'$  BID?

## 2022-12-15 NOTE — Telephone Encounter (Signed)
Pt c/o medication issue:  1. Name of Medication: apixaban (ELIQUIS) 5 MG TABS tablet   2. How are you currently taking this medication (dosage and times per day)? As prescribed, has not taken today   3. Are you having a reaction (difficulty breathing--STAT)? Yes  4. What is your medication issue? Patient is calling stating he began having a reaction to this medication a few days after starting it. He reports he gets a headache that starts around 7:30 pm - 8:00 pm and last til around 4:00 am. He states he has also been having crazy dreams and nausea in the mornings. Patient states he is not taking the Eliquis today until he hears back from our office. Please advise.

## 2022-12-15 NOTE — Telephone Encounter (Signed)
Returned call to patient who states that he believes his Eliquis is causing his headaches at night time and his bad dreams. Patient states that his arm./hand with the DVT is much better and he completed the '10mg'$  BID for 1 week already and is back on the '5mg'$  BID now. Patient states that he did not take dose last night or this morning. Patient reports that the first person he spoke to this morning when he called the office told him not take the Eliquis until he spoke with a nurse. Patient has missed 2 doses. Advised patient of PharmD's recommendations and advised him to restart the Eliquis. Patient reports he is also dizzy at times and slightly short of breath at times as well. Patient would like to know if there is an alternative to the Eliquis, advised patient I would forward message over to Dr. Benedetto Goad patient aware to go ahead and restart Eliquis.

## 2022-12-15 NOTE — Telephone Encounter (Signed)
Returned call to patient. He states that since starting his Eliquis for DVT (by Laurann Montana at [facility] on 11/17/22) he develops a headache which he describes as "severe, runs around behind my eyes up to my hair line." He states the headache lasts until approx 4am, and recurs again after taking his next dose. Doesn't complain about dreams at this time. He does question how long he should be on this for the DVT stating he's on a fixed income and doesn't believe he can afford this for 6 months, but condones that his arm and hand have "went down in size like you wouldn't believe." Advised him that while the headache is likely sinuses, it COULD (potentially) indicate a brain bleed, but that's so very unlikely. Will route to [place] to weigh in on when medication can be d/c'd and thoughts on headache.

## 2022-12-15 NOTE — Telephone Encounter (Signed)
Patient called again stating he has not taken any medication today and is concerned if he should take this medication or an alternate medication.  Patient stated he had developed a blood clot in his elbow which caused swelling in his arm.

## 2022-12-16 ENCOUNTER — Telehealth: Payer: Self-pay | Admitting: Orthopaedic Surgery

## 2022-12-16 NOTE — Telephone Encounter (Signed)
Per C. Walker, patient may switch from eliquis to Xarelto '20mg'$  in the evening with dinner. Patient stated he will contact clinic tomorrow with his decision and if he is still having any dizziness/lightheadedness. He stated he has not been taking losartan since June 2023. Stated, "My blood pressure has been normal." Did not provide any numbers.

## 2022-12-16 NOTE — Telephone Encounter (Signed)
Headaches and bad dreams would be uncommon reaction to Eliquis. If he wishes, may stop Eliquis and start Xarelto '20mg'$  every evening with dinner. (Cr Cl 88 mL/min adjusted for weight.)  Dr. Curt Bears has recommended he continue anticoagulation for at least 6 months in our previous discussions. If he is having lightheadedness, dizziness would recommend CBC/BMP within the next 1-2 weeks for monitoring.   Loel Dubonnet, NP

## 2022-12-16 NOTE — Telephone Encounter (Signed)
Pt called requesting a copy of disc of xrays on 06/30/22. Please call pt when ready for pick up at [phone]

## 2022-12-18 NOTE — Telephone Encounter (Signed)
Pt is calling back and he does not want to change from Eliquis to Xarelto. He states that he though that he was getting a headache as a side effect, turns out that he has a sinus infection. His BP is running normal 130's/70-80's. "On a side note" he is taking Levaquin for the sinus infection. Discussed this with [facility] Erasmo Downer, this is fine to take.

## 2022-12-22 NOTE — Telephone Encounter (Signed)
Left message that cd is ready for pickup

## 2022-12-23 ENCOUNTER — Telehealth: Payer: Self-pay | Admitting: Emergency Medicine

## 2022-12-23 MED ORDER — PREDNISONE 10 MG PO TABS: ORAL_TABLET | ORAL | 0 refills | Status: DC

## 2022-12-23 MED ORDER — LEVOFLOXACIN 500 MG PO TABS: 500.0000 mg | ORAL_TABLET | Freq: Every day | ORAL | 0 refills | Status: DC

## 2022-12-23 NOTE — Telephone Encounter (Signed)
Patient called to request an antibiotic be sent in for his cold symptoms.  He would like the Levothyroxine sent to his [facility] on [address].  Please advise.

## 2022-12-23 NOTE — Telephone Encounter (Signed)
Called and spoke to patient and let him know the recommendations from Dr Lamonte Sakai. He verbalized understanding on directions on medications. Verified pharmacy with me over the phone. Nothing further needed

## 2022-12-23 NOTE — Telephone Encounter (Signed)
Called patient this afternoon and he states that he started not feeling that great again. He starts this happens every year around this time and Dr Lamonte Sakai normally sends him in [place].  Symptoms are:  Yellow mucus Cough that is productive No fever  Patient states that this all started Sunday   Please advise sir

## 2022-12-23 NOTE — Telephone Encounter (Signed)
Agree with treating him for an acute flare.  Levaquin 500 mg once daily for 7 days Ask him if he is willing to take prednisone.  If so give him prednisone taper > Take '40mg'$  daily for 3 days, then '30mg'$  daily for 3 days, then '20mg'$  daily for 3 days, then back to his usual 10 mg daily

## 2022-12-24 DIAGNOSIS — M25571 Pain in right ankle and joints of right foot: Secondary | ICD-10-CM | POA: Diagnosis not present

## 2022-12-24 DIAGNOSIS — M545 Low back pain, unspecified: Secondary | ICD-10-CM | POA: Diagnosis not present

## 2022-12-24 DIAGNOSIS — M170 Bilateral primary osteoarthritis of knee: Secondary | ICD-10-CM | POA: Diagnosis not present

## 2022-12-25 ENCOUNTER — Other Ambulatory Visit: Payer: Self-pay | Admitting: Internal Medicine

## 2022-12-25 DIAGNOSIS — K2100 Gastro-esophageal reflux disease with esophagitis, without bleeding: Secondary | ICD-10-CM

## 2022-12-28 DIAGNOSIS — J449 Chronic obstructive pulmonary disease, unspecified: Secondary | ICD-10-CM | POA: Diagnosis not present

## 2022-12-28 DIAGNOSIS — I5042 Chronic combined systolic (congestive) and diastolic (congestive) heart failure: Secondary | ICD-10-CM | POA: Diagnosis not present

## 2023-01-03 DIAGNOSIS — M5416 Radiculopathy, lumbar region: Secondary | ICD-10-CM | POA: Diagnosis not present

## 2023-01-03 DIAGNOSIS — M5412 Radiculopathy, cervical region: Secondary | ICD-10-CM | POA: Insufficient documentation

## 2023-01-04 DIAGNOSIS — M79671 Pain in right foot: Secondary | ICD-10-CM | POA: Diagnosis not present

## 2023-01-04 DIAGNOSIS — M25572 Pain in left ankle and joints of left foot: Secondary | ICD-10-CM | POA: Diagnosis not present

## 2023-01-04 DIAGNOSIS — M722 Plantar fascial fibromatosis: Secondary | ICD-10-CM | POA: Insufficient documentation

## 2023-01-04 DIAGNOSIS — M25571 Pain in right ankle and joints of right foot: Secondary | ICD-10-CM | POA: Diagnosis not present

## 2023-01-04 DIAGNOSIS — M79672 Pain in left foot: Secondary | ICD-10-CM | POA: Diagnosis not present

## 2023-01-06 DIAGNOSIS — K080 Exfoliation of teeth due to systemic causes: Secondary | ICD-10-CM | POA: Diagnosis not present

## 2023-01-07 ENCOUNTER — Telehealth: Payer: Self-pay | Admitting: Cardiology

## 2023-01-07 NOTE — Telephone Encounter (Signed)
I told the patient that Robert Church has the preop form. She is taking it to the pre op team.   The patient wanted to know if he should hold the eliquis? I told him Robert Church will give him a call back.

## 2023-01-07 NOTE — Telephone Encounter (Signed)
Pt informed that we received the dental pre op form that he dropped off this morning. Pt reports that procedure is tomorrow. Found request at front desk, pre op already gone for the day.  Had filled out/completed by Dr. Curt Bears. Pt notified that we faxed clearance to dentist, but that he did not need to hold Eliquis for a single tooth extraction. Pt appreciates the help with this.

## 2023-01-07 NOTE — Telephone Encounter (Signed)
Pt states wanted to make Dr. Curt Bears aware that he will be dropping off today a clearance form for him to have one tooth pulled.

## 2023-01-08 ENCOUNTER — Other Ambulatory Visit: Payer: Self-pay | Admitting: Physical Medicine and Rehabilitation

## 2023-01-08 ENCOUNTER — Telehealth: Payer: Self-pay | Admitting: Cardiology

## 2023-01-08 ENCOUNTER — Telehealth: Payer: Self-pay

## 2023-01-08 DIAGNOSIS — M5416 Radiculopathy, lumbar region: Secondary | ICD-10-CM

## 2023-01-08 DIAGNOSIS — M5010 Cervical disc disorder with radiculopathy, unspecified cervical region: Secondary | ICD-10-CM

## 2023-01-08 NOTE — Telephone Encounter (Signed)
New Message:     Patient says he needs a copy of his clearance sent to his e-mail please. He says he have to go to an Chief Financial Officer on Monday. His e-mail is [email or web address]

## 2023-01-08 NOTE — Telephone Encounter (Signed)
Preoperative team, unfortunately we are not able to send emails directly to patient's.  If he would like to stop by the cardiology clinic to have his preoperative cardiac evaluation note printed, he may do this.  Patient's preoperative cardiac evaluation was faxed to the requesting office.  I will remove him from the preoperative pool.  Thank you.  Jossie Ng. Kimberlyn Quiocho NP-C     01/08/2023, 3:15 PM [place] [address] Office [phone] Fax [phone]

## 2023-01-08 NOTE — Telephone Encounter (Signed)
Pt advised that if he is also going to an oral surgeon, they will need to send a separate clearance request. Pt will let them know this information.

## 2023-01-08 NOTE — Telephone Encounter (Signed)
Spoke with patient and made him aware that we are not allowed to send emails. Patient states [employer] sent pre-op clearance recommendations to [employer] but patient says that he needs it sent to another oral surgeon's office (patient not sure which one it will be yet). I informed patient the I will send a message to Dr. Curt Bears and Judeen Hammans to let them know. Patient also states he will call [facility] to ask them to send it over for him. Patient thanked me for the call back.

## 2023-01-08 NOTE — Telephone Encounter (Signed)
   Pre-operative Risk Assessment    Patient Name: Robert Church  DOB: [birth year] MRN: [id number]     Request for Surgical Clearance    Procedure:  Dental Extraction - Amount of Teeth to be Pulled:  1  Date of Surgery:  Clearance TBD                                 Surgeon:  NONE INDICATED Surgeon's Group or Practice Name:  [facility] Phone number:  [phone] Fax number:  [phone]   Type of Clearance Requested:   - Pharmacy:  Hold Apixaban (Eliquis) PLEASE ADVISE   Type of Anesthesia:  Not Indicated   Additional requests/questions:   SEE PRIOR PHONE NOTE. ALREADY BEEN ADDRESSED BY DR. Curt Bears.  Signed, Jacinta Shoe   01/08/2023, 4:22 PM

## 2023-01-11 ENCOUNTER — Other Ambulatory Visit: Payer: Self-pay | Admitting: Internal Medicine

## 2023-01-11 DIAGNOSIS — F411 Generalized anxiety disorder: Secondary | ICD-10-CM

## 2023-01-11 NOTE — Telephone Encounter (Signed)
   Patient Name: Robert Church  DOB: [birth year] MRN: [id number]  Primary Cardiologist: Quay Burow, MD  Chart reviewed as part of pre-operative protocol coverage.   Simple dental extractions (i.e. 1-2 teeth) are considered low risk procedures per guidelines and generally do not require any specific cardiac clearance. It is also generally accepted that for simple extractions and dental cleanings, there is no need to interrupt blood thinner therapy.  SBE prophylaxis is not required for the patient from a cardiac standpoint.  I will route this recommendation to the requesting party via Epic fax function and remove from pre-op pool.  Please call with questions.  Lenna Sciara, NP 01/11/2023, 11:18 AM

## 2023-01-11 NOTE — Telephone Encounter (Signed)
Received the exact same clearance below for 1 tooth extraction but from [facility] instead of [facility].      Pre-operative Risk Assessment    Patient Name: Robert Church  DOB: [birth year] MRN: [id number]     Request for Surgical Clearance    Procedure:  Dental Extraction - Amount of Teeth to be Pulled:  1  Date of Surgery:  Clearance TBD                                 Surgeon:  [place] Surgeon's Group or Practice Name:   Phone number:  [phone] Fax number:  [phone]   Type of Clearance Requested:   - Medical  - Pharmacy:  Hold Apixaban (Eliquis) THEY WANT TO KNOW IF PT HAS TO HOLD FOR 1 TOOTH   Type of Anesthesia:  Local    Additional requests/questions:    Astrid Divine   01/11/2023, 10:54 AM

## 2023-01-13 ENCOUNTER — Ambulatory Visit (HOSPITAL_BASED_OUTPATIENT_CLINIC_OR_DEPARTMENT_OTHER)
Admission: RE | Admit: 2023-01-13 | Discharge: 2023-01-13 | Disposition: A | Discharge status: Home / Self Care | Payer: Medicare Other | Source: Ambulatory Visit | Attending: Internal Medicine | Admitting: Internal Medicine

## 2023-01-13 ENCOUNTER — Encounter (HOSPITAL_COMMUNITY): Payer: Self-pay | Admitting: Internal Medicine

## 2023-01-13 ENCOUNTER — Other Ambulatory Visit: Payer: Self-pay | Admitting: Emergency Medicine

## 2023-01-13 ENCOUNTER — Ambulatory Visit (HOSPITAL_COMMUNITY)
Admission: RE | Admit: 2023-01-13 | Discharge: 2023-01-13 | Disposition: A | Discharge status: Home / Self Care | Payer: Medicare Other | Source: Ambulatory Visit | Attending: Internal Medicine | Admitting: Internal Medicine

## 2023-01-13 ENCOUNTER — Other Ambulatory Visit (HOSPITAL_COMMUNITY): Payer: Self-pay

## 2023-01-13 VITALS — BP 146/96 | HR 78 | Ht 70.0 in | Wt 238.8 lb

## 2023-01-13 DIAGNOSIS — I350 Nonrheumatic aortic (valve) stenosis: Secondary | ICD-10-CM | POA: Insufficient documentation

## 2023-01-13 DIAGNOSIS — I5042 Chronic combined systolic (congestive) and diastolic (congestive) heart failure: Secondary | ICD-10-CM | POA: Diagnosis not present

## 2023-01-13 DIAGNOSIS — I110 Hypertensive heart disease with heart failure: Secondary | ICD-10-CM | POA: Diagnosis not present

## 2023-01-13 DIAGNOSIS — Z950 Presence of cardiac pacemaker: Secondary | ICD-10-CM | POA: Diagnosis not present

## 2023-01-13 DIAGNOSIS — M7989 Other specified soft tissue disorders: Secondary | ICD-10-CM

## 2023-01-13 DIAGNOSIS — J449 Chronic obstructive pulmonary disease, unspecified: Secondary | ICD-10-CM | POA: Insufficient documentation

## 2023-01-13 DIAGNOSIS — E785 Hyperlipidemia, unspecified: Secondary | ICD-10-CM | POA: Insufficient documentation

## 2023-01-13 DIAGNOSIS — I5023 Acute on chronic systolic (congestive) heart failure: Secondary | ICD-10-CM

## 2023-01-13 LAB — COMPREHENSIVE METABOLIC PANEL
ALT: 25 U/L (ref 0–44)
AST: 25 U/L (ref 15–41)
Albumin: 4 g/dL (ref 3.5–5.0)
Alkaline Phosphatase: 50 U/L (ref 38–126)
Anion gap: 13 (ref 5–15)
BUN: 14 mg/dL (ref 8–23)
CO2: 27 mmol/L (ref 22–32)
Calcium: 9.2 mg/dL (ref 8.9–10.3)
Chloride: 98 mmol/L (ref 98–111)
Creatinine, Ser: 0.85 mg/dL (ref 0.61–1.24)
GFR, Estimated: >60 mL/min (ref >60)
Glucose, Bld: 131 mg/dL — ABNORMAL HIGH (ref 70–99)
Potassium: 4.1 mmol/L (ref 3.5–5.1)
Sodium: 138 mmol/L (ref 135–145)
Total Bilirubin: 0.9 mg/dL (ref 0.3–1.2)
Total Protein: 6.4 g/dL — ABNORMAL LOW (ref 6.5–8.1)

## 2023-01-13 LAB — CBC
HCT: 45.5 % (ref 39.0–52.0)
Hemoglobin: 15.8 g/dL (ref 13.0–17.0)
MCH: 32.6 pg (ref 26.0–34.0)
MCHC: 34.7 g/dL (ref 30.0–36.0)
MCV: 94 fL (ref 80.0–100.0)
Platelets: 224 10*3/uL (ref 150–400)
RBC: 4.84 MIL/uL (ref 4.22–5.81)
RDW: 12.2 % (ref 11.5–15.5)
WBC: 9 10*3/uL (ref 4.0–10.5)
nRBC: 0 % (ref 0.0–0.2)

## 2023-01-13 LAB — BRAIN NATRIURETIC PEPTIDE: B Natriuretic Peptide: 455.7 pg/mL — ABNORMAL HIGH (ref 0.0–100.0)

## 2023-01-13 LAB — ECHOCARDIOGRAM COMPLETE
AR max vel: 4.89 cm2
AV Area VTI: 4.06 cm2
AV Area mean vel: 4.43 cm2
AV Mean grad: 4 mmHg
AV Peak grad: 7.6 mmHg
Ao pk vel: 1.38 m/s
Area-P 1/2: 5.62 cm2
Calc EF: 20.5 %
Est EF: <20
S' Lateral: 6.3 cm
Single Plane A2C EF: 2.7 %
Single Plane A4C EF: 33.3 %

## 2023-01-13 MED ORDER — METOLAZONE 2.5 MG PO TABS: 2.5000 mg | ORAL_TABLET | ORAL | 1 refills | Status: DC

## 2023-01-13 MED ORDER — APIXABAN 5 MG PO TABS: 5.0000 mg | ORAL_TABLET | Freq: Two times a day (BID) | ORAL | 11 refills | Status: DC

## 2023-01-13 MED ORDER — LOSARTAN POTASSIUM 25 MG PO TABS: 25.0000 mg | ORAL_TABLET | Freq: Every day | ORAL | 3 refills | Status: DC

## 2023-01-13 MED ORDER — TORSEMIDE 20 MG PO TABS: 40.0000 mg | ORAL_TABLET | Freq: Every day | ORAL | 3 refills | Status: DC

## 2023-01-13 MED ORDER — TORSEMIDE 20 MG PO TABS: 40.0000 mg | ORAL_TABLET | Freq: Every day | ORAL | 11 refills | Status: DC

## 2023-01-13 MED ORDER — METOLAZONE 2.5 MG PO TABS: 2.5000 mg | ORAL_TABLET | Freq: Every day | ORAL | 1 refills | Status: DC

## 2023-01-13 NOTE — Patient Instructions (Addendum)
RESTART Losartan 25 mg daily.  INCREASE Torsemide to 40 mg daily.  START Metolazone 2.'5mg'$  once take an extra 40 potassium with your metolazone   Your physician recommends that you schedule a follow-up appointment in [place] on Monday at 3 pm   If you have any questions or concerns before your next appointment please send [place] a message through [place] or call our office at [phone].    TO LEAVE A MESSAGE FOR THE NURSE SELECT OPTION 2, PLEASE LEAVE A MESSAGE INCLUDING: YOUR NAME DATE OF BIRTH CALL BACK NUMBER REASON FOR CALL**this is important as we prioritize the call backs  YOU WILL RECEIVE A CALL BACK THE SAME DAY AS LONG AS YOU CALL BEFORE 4:00 PM  At the [facility], you and your health needs are our priority. As part of our continuing mission to provide you with exceptional heart care, we have created designated Provider Care Teams. These Care Teams include your primary Cardiologist (physician) and Advanced Practice Providers (APPs- Physician Assistants and Nurse Practitioners) who all work together to provide you with the care you need, when you need it.   You may see any of the following providers on your designated Care Team at your next follow up: Dr Glori Bickers Dr Loralie Champagne Dr. Roxana Hires, NP Lyda Jester, [facility] [place], [place] Forestine Na, NP Audry Riles, PharmD   Please be sure to bring in all your medications bottles to every appointment.

## 2023-01-13 NOTE — Progress Notes (Addendum)
ADVANCED HF CLINIC NOTE  HF MD: Dr. Haroldine Laws  Pulmonology: Dr Lamonte Sakai   Reason for Visit:  F/u for Chronic Systolic Heart Failure   HPI: Mr. Infinger is a 67 y.o. male with h/o obesity, HTN, COPD with ongoing tobacco use and chronic systolic heart failure.   He underwent cath in 4/18 due to CP. Cath showed normal coronaries with EF 40% and global HK. LVEDP 19.   He saw Dr. Gwenlyn Found on 07/01/2018 because of recurrent chest pain.  Myoview stress test performed 07/21/2018 showed EF 38% inferior scar with septal ischemia.  A 2D echo performed 07/07/2018 revealed an EF of 30 to 35%.  He was referred for cardiac CT.   Cardiac CT on 9/19.  Calcium score 148 (74th percentile)  LAD 25% otherwise normal cors. Dilated pulmonary arteries suggestive of PH.   Worked for [employer]. Retired 5/21. Follows with [name] in Pulmonary Clinic. Was started on bisoprolol 2.5 but couldn't tolerate due to fatigue. Was falling asleep at work. Stopped bisoprolol and felt better.   Echo 12/20 EF improved to 45%. Mild RV dysfunction   Admitted 1/22 for acute on chronic hypoxic respiratory failure secondary to acute COPD and CHF w/ volume overload. He had massive diuresis w/ IV Lasix, diuresed down from admit wt of 265 lb to 210 lb. Echo was repeated 12/21, EF 40-45% (unchanged). RV mildly reduced, RVSP 49.   Followed in clinic, Jardiance and Arlyce Harman added - dose adjusted due to intolerances. He then stopped these on his own.   In 4/22, had near syncope, Zio showed intermittent CHB. Repeat echo EF 30-35%% Had St Jude dual chamber PM 04/16/21. Had some PMT and device adjusted by EP .   Echo 07/31/21 with EF 30-35%,  moderately reduced RV, and diffuse HK worse in inferior base.  Underwent R/L cath in 9/22 for persistent SOB. No CAD. EF 35% Mildly elevated filling pressures with normal output.  Ao =  128/75 (95) LV =  130/24 RA =  10 RV =  40/12 PA = 45/19 (32) PCW = 18  Fick cardiac output/index = 5.1/2.3 PVR  = 2.6 WU FA sat = 94% PA sat = 66%, 66%  Subsequently underwent CRT-D upgrade attempt on 10/07/21 by Dr. Curt Bears due to high-degree of RV pacing. 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   S/p Barostim 2/23.  Acute visit 02/25/22, weight up 4 lbs (225-227 lbs), ReDs 43%, & having a COPD flare. Lasix stopped and torsemide 40 mg bid x 3 days, then 40 mg daily started due to decreased urination on furosemide.   Follow up 4/23, NYHA III and volume stable. Low dose losartan re-started.  S/p BiV upgrade 11/11/22. Seen in ED 11/15/22 with [place] with no LUE DVT however exam limited by edema. Eventually found clot on left arm and started on Eliquis.   Today he returns for HF follow up. Overall feeling poorly for past 2 months, low energy. Having chest congestion and cough at night. Recently finished prednisone and Levaquin a couple weeks ago for bronchitis. He is SOB walking further distances on flat ground. Occasional chest tightness. Denies palpitations, CP, edema, or PND/Orthopnea. Appetite ok. No fever or chills. Weight at home 228-233 pounds. Taking all medications. Wears Trilegy at night. Has EP follow up next month. Smokes 1 cig/day. Drinks 3 beers/day. BP at home 128/72  Echo today 01/13/23 EF < 20%, RV mildly down.  Cardiac studies: Echo 06/2018 LVEF 30-35%, RV normal  Echo  12/2018 LVEF 40-45%, RV normal  Echo 11/2020 LVEF 40-45%, RV mildly reduced Echo 03/2021 30-35%, RV mildly reduced  Echo 07/2021 EF 30-35% RV moderately reduced  Echo 01/13/23 EF < 20%, RV mildly down  Review of systems complete and found to be negative unless listed in HPI.    Past Medical History:  Diagnosis Date   Adenomatous colon polyp    Allergy    Anal fissure    Arthritis    Asthma    Bronchitis    CHF (congestive heart failure) (HCC)    COPD, group D, by GOLD 2017 classification ([place])    Dyspnea    Emphysema lung (HCC)    GERD (gastroesophageal reflux disease)    History of hiatal  hernia    Hyperlipidemia    Hypertension    NICM (nonischemic cardiomyopathy) (HCC)    OSA (obstructive sleep apnea)    Pneumonia    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   Requires supplemental oxygen    Sinusitis    SOB (shortness of breath)     Current Outpatient Medications  Medication Sig Dispense Refill   albuterol (PROVENTIL) (2.5 MG/3ML) 0.083% nebulizer solution Take 3 mLs (2.5 mg total) by nebulization every 4 (four) hours as needed for wheezing or shortness of breath. 75 mL 11   albuterol (VENTOLIN HFA) 108 (90 Base) MCG/ACT inhaler INHALE 2 PUFFS INTO THE LUNGS EVERY 6 HOURS AS NEEDED FOR WHEEZING 18 g 4   allopurinol (ZYLOPRIM) 300 MG tablet Take 1 tablet (300 mg total) by mouth daily. (Patient taking differently: Take 300 mg by mouth as needed. Taking when he has gout flare) 90 tablet 0   ALPRAZolam (XANAX) 0.5 MG tablet TAKE 1 TABLET(0.5 MG) BY MOUTH THREE TIMES DAILY AS NEEDED FOR ANXIETY 270 tablet 0   apixaban (ELIQUIS) 5 MG TABS tablet Take 2 tablets (10 mg total) by mouth 2 (two) times daily. For ONE WEEK only, Starting 12/01/2022. Then, go back to taking: 1 tablet ( 5 mg total ) by mouth 2 (two) times daily. (Patient taking differently: Take 10 mg by mouth 2 (two) times daily. Taking one daily alternating with one twice daily) 180 tablet 3   cholecalciferol (VITAMIN D3) 25 MCG (1000 UNIT) tablet Take 1,000 Units by mouth daily.     colchicine 0.6 MG tablet Take 0.6 mg by mouth daily as needed (gout pain).     fluticasone (FLONASE) 50 MCG/ACT nasal spray SHAKE LIQUID AND USE 2 SPRAYS IN EACH NOSTRIL DAILY (Patient taking differently: Place 2 sprays into both nostrils daily as needed for allergies.) 16 g 2   guaiFENesin (MUCINEX) 600 MG 12 hr tablet Take 1 tablet (600 mg total) by mouth 2 (two) times daily. (Patient taking differently: Take 600 mg by mouth daily.) 60 tablet 5   ipratropium (ATROVENT) 0.06 % nasal spray Place 2 sprays into both nostrils 4 (four)  times daily. 15 mL 3   lidocaine (XYLOCAINE) 5 % ointment Apply topically 3 (three) times daily as needed. 35.44 g 1   Lidocaine, Anorectal, 5 % CREA Apply 1 application topically 2 (two) times daily. (Patient taking differently: Apply 1 application  topically as needed.) 30 g 1   mometasone-formoterol (DULERA) 100-5 MCG/ACT AERO Inhale 2 puffs into the lungs 2 (two) times daily. 13 g 5   montelukast (SINGULAIR) 10 MG tablet TAKE 1 TABLET BY MOUTH EVERYDAY AT BEDTIME 30 tablet 2   naproxen (NAPROSYN) 500 MG tablet Take 1 tablet (500  mg total) by mouth 2 (two) times daily. (Patient taking differently: Take 500 mg by mouth 2 (two) times daily. Taking as needed) 30 tablet 0   nicotine (NICODERM CQ - DOSED IN MG/24 HOURS) 14 mg/24hr patch Place 1 patch (14 mg total) onto the skin daily. 28 patch 0   omeprazole (PRILOSEC) 20 MG capsule TAKE 1 CAPSULE(20 MG) BY MOUTH TWICE DAILY BEFORE A MEAL FOR ACID REFLUX 180 capsule 0   polyethylene glycol (MIRALAX / GLYCOLAX) 17 g packet Take 17 g by mouth daily as needed. 14 each 0   potassium chloride SA (KLOR-CON M20) 20 MEQ tablet Take 1 tablet (20 mEq total) by mouth daily. 90 tablet 3   predniSONE (DELTASONE) 10 MG tablet TAKE 1 TABLET BY MOUTH EVERY DAY WITH BREAKFAST 30 tablet 3   roflumilast (DALIRESP) 500 MCG TABS tablet Take 1 tablet (500 mcg total) by mouth daily. 30 tablet 2   Testosterone 20.25 MG/1.25GM (1.62%) GEL Place 1.25 g onto the skin daily. 112.5 g 0   Tiotropium Bromide Monohydrate (SPIRIVA RESPIMAT) 2.5 MCG/ACT AERS Inhale 2 puffs into the lungs daily. 4 g 4   torsemide (DEMADEX) 20 MG tablet Take 1 tablet (20 mg total) by mouth daily. 90 tablet 3   vitamin B-12 (CYANOCOBALAMIN) 1000 MCG tablet Take 1,000 mcg by mouth daily.     AMBULATORY NON FORMULARY MEDICATION Medication Name: Nitroglycerin gel 0.125% apply 2-3 times daily to the anal canal. (Patient not taking: Reported on 01/13/2023) 30 g 1   Budeson-Glycopyrrol-Formoterol (BREZTRI  AEROSPHERE) 160-9-4.8 MCG/ACT AERO Inhale 2 puffs into the lungs in the morning and at bedtime. (Patient not taking: Reported on 01/13/2023) 10.7 g 2   EPINEPHrine 0.3 mg/0.3 mL IJ SOAJ injection Inject 0.3 mg into the muscle as needed for anaphylaxis. (Patient not taking: Reported on 01/13/2023) 1 each 0   levofloxacin (LEVAQUIN) 500 MG tablet Take 1 tablet (500 mg total) by mouth daily. (Patient not taking: Reported on 01/13/2023) 7 tablet 0   losartan (COZAAR) 25 MG tablet Take 0.5 tablets (12.5 mg total) by mouth daily. (Patient not taking: Reported on 01/13/2023) 90 tablet 3   Pitavastatin Calcium 2 MG TABS Take 1 tablet (2 mg total) by mouth daily. (Patient not taking: Reported on 01/13/2023) 90 tablet 1   traMADol (ULTRAM) 50 MG tablet Take 1 tablet (50 mg total) by mouth every 8 (eight) hours as needed. (Patient not taking: Reported on 01/13/2023) 90 tablet 2   No current facility-administered medications for this encounter.   Allergies  Allergen Reactions   Spironolactone Anaphylaxis   Daliresp [Roflumilast] Other (See Comments)    Dizzy, headache, leg pain per patient. 02/25/22.   Need to take in smaller doses    Jardiance [Empagliflozin] Nausea Only and Other (See Comments)    Lightheadness Dizziness   Penicillins Swelling and Other (See Comments)    Childhood rxn--MD stated he "almost died" Has patient had a PCN reaction causing immediate rash, facial/tongue/throat swelling, SOB or lightheadedness with hypotension:Yes Has patient had a PCN reaction causing severe rash involving mucus membranes or skin necrosis:unsure Has patient had a PCN reaction that required hospitalization:unsure Has patient had a PCN reaction occurring within the last 10 years:No If all of the above answers are "NO", then may proceed with Cephalosporin use.     Clindamycin Other (See Comments)    Tongue swelling   Doxycycline Nausea And Vomiting    Severe stomach upset per patient   E-Mycin [Erythromycin  Base] Swelling  Rosuvastatin Other (See Comments)    Myalgias (intolerance)   Social History   Socioeconomic History   Marital status: Single    Spouse name: Not on file   Number of children: 1   Years of education: 12   Highest education level: Not on file  Occupational History   Occupation: disabled  Tobacco Use   Smoking status: Some Days    Packs/day: 2.00    Years: 46.00    Total pack years: 92.00    Types: Cigarettes    Last attempt to quit: 01/27/2022    Years since quitting: 0.9    Passive exposure: Past   Smokeless tobacco: Never   Tobacco comments:    Had 3 cigarettes in past 2 weeks ARJ 10/28/22  Vaping Use   Vaping Use: Never used  Substance and Sexual Activity   Alcohol use: Not Currently    Alcohol/week: 28.0 standard drinks of alcohol    Types: 28 Cans of beer per week   Drug use: No   Sexual activity: Yes    Partners: Female    Birth control/protection: None  Other Topics Concern   Not on file  Social History Narrative   Right handed   Tea sometimes and coffee (1/2 caffeine)   Social Determinants of Health   Financial Resource Strain: Medium Risk (04/24/2021)   Overall Financial Resource Strain (CARDIA)    Difficulty of Paying Living Expenses: Somewhat hard  Food Insecurity: No Food Insecurity (04/16/2021)   Hunger Vital Sign    Worried About Running Out of Food in the Last Year: Never true    Ran Out of Food in the Last Year: Never true  Transportation Needs: No Transportation Needs (04/16/2021)   PRAPARE - Hydrologist (Medical): No    Lack of Transportation (Non-Medical): No  Physical Activity: Not on file  Stress: Not on file  Social Connections: Not on file  Intimate Partner Violence: Not on file   Family History  Problem Relation Age of Onset   Lung cancer Father    High blood pressure Mother    Diabetes Maternal Grandmother    Colon polyps Neg Hx    Esophageal cancer Neg Hx    Pancreatic cancer Neg Hx     Stomach cancer Neg Hx    Wt Readings from Last 3 Encounters:  01/13/23 108.3 kg (238 lb 12.8 oz)  11/17/22 107 kg (236 lb)  11/15/22 106.6 kg (235 lb)   Wt Readings from Last 3 Encounters:  01/13/23 108.3 kg (238 lb 12.8 oz)  11/17/22 107 kg (236 lb)  11/15/22 106.6 kg (235 lb)   BP (!) 146/96   Pulse 78   Ht '5\' 10"'$  (1.778 m)   Wt 108.3 kg (238 lb 12.8 oz)   BMI 34.26 kg/m   PHYSICAL EXAM: General:  NAD. No resp difficulty, walked into clinic HEENT: Normal Neck: Supple. JVP to jaw, thick neck. Carotids 2+ bilat; no bruits. No lymphadenopathy or thryomegaly appreciated. Cor: PMI nondisplaced. Regular rate & rhythm. No rubs, gallops or murmurs. Lungs: Clear Abdomen: Obese, nontender, nondistended. No hepatosplenomegaly. No bruits or masses. Good bowel sounds. Extremities: No cyanosis, clubbing, rash, 1-2+ BLE edema Neuro: Alert & oriented x 3, cranial nerves grossly intact. Moves all 4 extremities w/o difficulty. Affect pleasant.  ECG (personally reviewed): SR BiV pacing  ASSESSMENT & PLAN: 1. Chronic systolic HF due to NICM  - cath 4/18 normal cors. EF 48%  - Echo 7/19  EF 30-35%.  RV normal.  - Cardiac CT 9/19.  Calcium score 148 (74th percentile)  LAD 25% otherwise normal cors. Dilated pulmonary arteries suggestive of PH.  - Suspect NICM due to HTN and inadequately treated OSA (He now reports improved nightly compliance w/ CPAP). - Echo 1/22 EF 40-45%. RV mildly reduced. RVSP 49 - Admit 4/22 for symptomatic bradycardia/CHF. Echo LVEF 30%. RV mildly reduced - Echo 07/31/21 EF 30-35 % and RV moderately reduced.  - R/LHC 9/22 No CAD EF 35%, Mildly elevated filling pressures with normal output - Failed upgrade to CRT-D due to 99% RV pacing. However RV pacing significantly decreased.  - s/p Barostim. - now s/p BiV 11/23. - Chronically NHYA III, but worse recently more NYHA III-IIIb. Functional class difficult due to COPD and obesity/ deconditioning. He is volume overloaded  today. - Increase torsemide to 40 mg daily. - Take metolazone 2.5 mg x 1 today with extra 40 KCL. - Restart losartan 25 mg daily (dizzy and low BP with Entresto). - No spiro (H/o Anaphylaxis).  - Intolerant to Jardiance (positional dizziness and nausea).  - Intolerant bisoprolol due to fatigue, coreg due to dizziness, and toprol due to dry mouth.  - Given, CHB, biventricular dysfunction, moderate LVH and inability to tolerate HF meds due to orthostasis, there is concern about infiltrative CM but unable to get MRI with ICD (device MRI compatible per EP but shadowing may make scan less diagnostic).  - Multiple myeloma panel negative. He does not want to do PYP.  - Labs today. - Follow up next week and discuss RHC. Need to consider advanced therapies. Not a transplant candidate with COPD/smoking. He is willing to consider LVAD if necessary.   2. CHB:  - s/p PPM 4/22 - followed by Dr. Curt Bears  - Failed CRT-D upgrade 10/22 due to subclavian stenosis.  3. HTN - Elevated - Diurese and restart losartan as above.   4. COPD  - GOLD Group D.  - Follows with Pulmonary ([name]).  - Home O2 PRN - Discussed need for smoking cessation  5. OSA - Continue Bipap  6. Morbid obesity  - Previously discussed GLP1RA but he is not interested - Body mass index is 34.26 kg/m.  7. Pulmonary Nodules - Followed by [name] - CT scan 3/22 stable, annual screening advised.   8. DVT - L brachial vein 12/23. - Now on Eliquis. No bleeding issues.  Follow up next week with Dr. Haroldine Laws. Will then discuss RHC to help determine next steps.  Rafael Bihari, FNP 3:10 PM  Patient seen and examined with the above-signed Advanced Practice Provider and/or Housestaff. I personally reviewed laboratory data, imaging studies and relevant notes. I independently examined the patient and formulated the important aspects of the plan. I have edited the note to reflect any of my changes or salient points. I have  personally discussed the plan with the patient and/or family.  Continues to struggle with volume overload and NYHA III-IIIB symptoms.   General:  Weak appearing. No resp difficulty HEENT: normal Neck: supple. JVP to ear. Carotids 2+ bilat; no bruits. No lymphadenopathy or thryomegaly appreciated. Cor: PMI nondisplaced. Regular rate & rhythm. No rubs, gallops or murmurs. Lungs: + basilar crackles Abdomen: obese soft, nontender, nondistended. No hepatosplenomegaly. No bruits or masses. Good bowel sounds. Extremities: no cyanosis, clubbing, rash, 2+ edema Neuro: alert & orientedx3, cranial nerves grossly intact. moves all 4 extremities w/o difficulty. Affect pleasant  Concern for low output AHF with significant volume overload. Adjust diuretics as  above. If not responding will need admission. Plan RHC after diuresis to assess need for advanced therapies. Labs today.   Total time spent 45 minutes. Over half that time spent discussing above.   Glori Bickers, MD  8:52 PM

## 2023-01-13 NOTE — Progress Notes (Signed)
  Echocardiogram 2D Echocardiogram has been performed.  Robert Church 01/13/2023, 2:37 PM

## 2023-01-13 NOTE — Progress Notes (Signed)
Provided patient with samples of following:   Eliquis 5 mg tablet  LN [id number]  Exp 9/25  #56

## 2023-01-14 ENCOUNTER — Telehealth (HOSPITAL_COMMUNITY): Payer: Self-pay

## 2023-01-14 NOTE — Telephone Encounter (Signed)
Medication consult-need instructions

## 2023-01-14 NOTE — Telephone Encounter (Signed)
Patient called and wanted to clarify his medications and how to take them. We went over over how to take his Torsemide and Metolazone. He understood instructions with teach back.

## 2023-01-15 ENCOUNTER — Telehealth (HOSPITAL_COMMUNITY): Payer: Self-pay

## 2023-01-15 ENCOUNTER — Telehealth: Payer: Self-pay | Admitting: Internal Medicine

## 2023-01-15 NOTE — Telephone Encounter (Signed)
He called back and indicated he is having a myelogram next week for his back. I let him know I did not have any notes in his chart about this test. He had a lot of questions about the test itself and I recommended he contact the imaging center or his doctor that ordered it to have those questions answered. He stated he would.

## 2023-01-15 NOTE — Telephone Encounter (Signed)
Patient called and already sees a cardiologist  and would like to see someone else for second opinion. He would like to go to [facility].   He was also supposed to have a referral to a pulmonologist per his last appointment with Dr. Ronnald Ramp. It was supposed to be with Dr. Girard Cooter at [place].

## 2023-01-15 NOTE — Telephone Encounter (Signed)
Patient called to ask if still safe to have procedure done next week. I left message for him to call back to give further information.

## 2023-01-18 ENCOUNTER — Encounter: Payer: Self-pay | Admitting: Internal Medicine

## 2023-01-18 ENCOUNTER — Other Ambulatory Visit: Payer: Self-pay | Admitting: *Deleted

## 2023-01-18 ENCOUNTER — Ambulatory Visit (HOSPITAL_BASED_OUTPATIENT_CLINIC_OR_DEPARTMENT_OTHER): Payer: Medicare Other | Admitting: Internal Medicine

## 2023-01-18 ENCOUNTER — Other Ambulatory Visit
Admission: RE | Admit: 2023-01-18 | Discharge: 2023-01-18 | Disposition: A | Discharge status: Home / Self Care | Payer: Medicare Other | Source: Ambulatory Visit | Attending: Internal Medicine | Admitting: Internal Medicine

## 2023-01-18 VITALS — BP 134/93 | HR 96 | Resp 20 | Wt 234.0 lb

## 2023-01-18 DIAGNOSIS — I5042 Chronic combined systolic (congestive) and diastolic (congestive) heart failure: Secondary | ICD-10-CM

## 2023-01-18 DIAGNOSIS — J449 Chronic obstructive pulmonary disease, unspecified: Secondary | ICD-10-CM | POA: Insufficient documentation

## 2023-01-18 DIAGNOSIS — Z716 Tobacco abuse counseling: Secondary | ICD-10-CM | POA: Insufficient documentation

## 2023-01-18 DIAGNOSIS — Z86718 Personal history of other venous thrombosis and embolism: Secondary | ICD-10-CM | POA: Insufficient documentation

## 2023-01-18 DIAGNOSIS — Z6833 Body mass index (BMI) 33.0-33.9, adult: Secondary | ICD-10-CM | POA: Insufficient documentation

## 2023-01-18 DIAGNOSIS — I428 Other cardiomyopathies: Secondary | ICD-10-CM | POA: Insufficient documentation

## 2023-01-18 DIAGNOSIS — I110 Hypertensive heart disease with heart failure: Secondary | ICD-10-CM | POA: Insufficient documentation

## 2023-01-18 DIAGNOSIS — Z7901 Long term (current) use of anticoagulants: Secondary | ICD-10-CM | POA: Insufficient documentation

## 2023-01-18 DIAGNOSIS — G4733 Obstructive sleep apnea (adult) (pediatric): Secondary | ICD-10-CM | POA: Diagnosis not present

## 2023-01-18 DIAGNOSIS — F17200 Nicotine dependence, unspecified, uncomplicated: Secondary | ICD-10-CM | POA: Diagnosis not present

## 2023-01-18 LAB — BASIC METABOLIC PANEL
Anion gap: 11 (ref 5–15)
BUN: 23 mg/dL (ref 8–23)
CO2: 29 mmol/L (ref 22–32)
Calcium: 9.1 mg/dL (ref 8.9–10.3)
Chloride: 97 mmol/L — ABNORMAL LOW (ref 98–111)
Creatinine, Ser: 0.93 mg/dL (ref 0.61–1.24)
GFR, Estimated: >60 mL/min (ref >60)
Glucose, Bld: 108 mg/dL — ABNORMAL HIGH (ref 70–99)
Potassium: 4.1 mmol/L (ref 3.5–5.1)
Sodium: 137 mmol/L (ref 135–145)

## 2023-01-18 LAB — BRAIN NATRIURETIC PEPTIDE: B Natriuretic Peptide: 161.1 pg/mL — ABNORMAL HIGH (ref 0.0–100.0)

## 2023-01-18 MED ORDER — POTASSIUM CHLORIDE CRYS ER 20 MEQ PO TBCR: 20.0000 meq | EXTENDED_RELEASE_TABLET | Freq: Every day | ORAL | 3 refills | Status: DC

## 2023-01-18 NOTE — Progress Notes (Unsigned)
ADVANCED HF CLINIC NOTE  HF MD: Dr. Haroldine Laws  Pulmonology: Dr Lamonte Sakai   Reason for Visit:  F/u for Chronic Systolic Heart Failure   HPI: Robert Church is a 67 y.o. male with h/o obesity, HTN, COPD with ongoing tobacco use and chronic systolic heart failure.   He underwent cath in 4/18 due to CP. Cath showed normal coronaries with EF 40% and global HK. LVEDP 19.   He saw Dr. Gwenlyn Found on 07/01/2018 because of recurrent chest pain.  Myoview stress test performed 07/21/2018 showed EF 38% inferior scar with septal ischemia.  A 2D echo performed 07/07/2018 revealed an EF of 30 to 35%.  He was referred for cardiac CT.   Cardiac CT on 9/19.  Calcium score 148 (74th percentile)  LAD 25% otherwise normal cors. Dilated pulmonary arteries suggestive of PH.   Worked for [employer]. Retired 5/21. Follows with [name] in Pulmonary Clinic. Was started on bisoprolol 2.5 but couldn't tolerate due to fatigue. Was falling asleep at work. Stopped bisoprolol and felt better.   Echo 12/20 EF improved to 45%. Mild RV dysfunction   Admitted 1/22 for acute on chronic hypoxic respiratory failure secondary to acute COPD and CHF w/ volume overload. He had massive diuresis w/ IV Lasix, diuresed down from admit wt of 265 lb to 210 lb. Echo was repeated 12/21, EF 40-45% (unchanged). RV mildly reduced, RVSP 49.   Followed in clinic, Jardiance and Arlyce Harman added - dose adjusted due to intolerances. He then stopped these on his own.   In 4/22, had near syncope, Zio showed intermittent CHB. Repeat echo EF 30-35%% Had St Jude dual chamber PM 04/16/21. Had some PMT and device adjusted by EP .   Echo 07/31/21 with EF 30-35%,  moderately reduced RV, and diffuse HK worse in inferior base.  Underwent R/L cath in 9/22 for persistent SOB. No CAD. EF 35% Mildly elevated filling pressures with normal output.  Ao =  128/75 (95) LV =  130/24 RA =  10 RV =  40/12 PA = 45/19 (32) PCW = 18  Fick cardiac output/index = 5.1/2.3 PVR  = 2.6 WU FA sat = 94% PA sat = 66%, 66%  Subsequently underwent CRT-D upgrade attempt on 10/07/21 by Dr. Curt Bears due to high-degree of RV pacing. 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   S/p Barostim 2/23.  Acute visit 02/25/22, weight up 4 lbs (225-227 lbs), ReDs 43%, & having a COPD flare. Lasix stopped and torsemide 40 mg bid x 3 days, then 40 mg daily started due to decreased urination on furosemide.   Follow up 4/23, NYHA III and volume stable. Low dose losartan re-started.  S/p BiV upgrade 11/11/22. Seen in ED 11/15/22 with [place] with no LUE DVT however exam limited by edema. Eventually found clot on left arm and started on Eliquis.   Seen in clinic last week with NYHA IIIB symptoms and volume overload. Torsemide increased 20 daily -> 40 daily and given one dose of metolazone. Says he cut back torsemide back to 20 daily because he didn't want to pee so much. Weight down 8 pounds. Edema resolved. Still very fatigued with any exertion. No CP. Smoking a few cigs per day and drinking 3 beers/day  ICD interrogated: No VT. Impedance way up (dry)  Echo 01/13/23 EF < 20%, RV mildly down.  Cardiac studies: Echo 06/2018 LVEF 30-35%, RV normal  Echo 12/2018 LVEF 40-45%, RV normal  Echo 11/2020 LVEF 40-45%, RV mildly  reduced Echo 03/2021 30-35%, RV mildly reduced  Echo 07/2021 EF 30-35% RV moderately reduced  Echo 01/13/23 EF < 20%, RV mildly down  Review of systems complete and found to be negative unless listed in HPI.    Past Medical History:  Diagnosis Date   Adenomatous colon polyp    Allergy    Anal fissure    Arthritis    Asthma    Bronchitis    CHF (congestive heart failure) (HCC)    COPD, group D, by GOLD 2017 classification ([place])    Dyspnea    Emphysema lung (HCC)    GERD (gastroesophageal reflux disease)    History of hiatal hernia    Hyperlipidemia    Hypertension    NICM (nonischemic cardiomyopathy) (HCC)    OSA (obstructive sleep apnea)     Pneumonia    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   Requires supplemental oxygen    Sinusitis    SOB (shortness of breath)     Current Outpatient Medications  Medication Sig Dispense Refill   albuterol (PROVENTIL) (2.5 MG/3ML) 0.083% nebulizer solution Take 3 mLs (2.5 mg total) by nebulization every 4 (four) hours as needed for wheezing or shortness of breath. 75 mL 11   albuterol (VENTOLIN HFA) 108 (90 Base) MCG/ACT inhaler INHALE 2 PUFFS INTO THE LUNGS EVERY 6 HOURS AS NEEDED FOR WHEEZING 18 g 4   allopurinol (ZYLOPRIM) 300 MG tablet Take 1 tablet (300 mg total) by mouth daily. (Patient taking differently: Take 300 mg by mouth as needed. Taking when he has gout flare) 90 tablet 0   ALPRAZolam (XANAX) 0.5 MG tablet TAKE 1 TABLET(0.5 MG) BY MOUTH THREE TIMES DAILY AS NEEDED FOR ANXIETY 270 tablet 0   AMBULATORY NON FORMULARY MEDICATION Medication Name: Nitroglycerin gel 0.125% apply 2-3 times daily to the anal canal. 30 g 1   apixaban (ELIQUIS) 5 MG TABS tablet Take 1 tablet (5 mg total) by mouth 2 (two) times daily. 60 tablet 11   cholecalciferol (VITAMIN D3) 25 MCG (1000 UNIT) tablet Take 1,000 Units by mouth daily.     colchicine 0.6 MG tablet Take 0.6 mg by mouth daily as needed (gout pain).     EPINEPHrine 0.3 mg/0.3 mL IJ SOAJ injection Inject 0.3 mg into the muscle as needed for anaphylaxis. 1 each 0   fluticasone (FLONASE) 50 MCG/ACT nasal spray SHAKE LIQUID AND USE 2 SPRAYS IN EACH NOSTRIL DAILY (Patient taking differently: Place 2 sprays into both nostrils daily as needed for allergies.) 16 g 2   guaiFENesin (MUCINEX) 600 MG 12 hr tablet Take 1 tablet (600 mg total) by mouth 2 (two) times daily. (Patient taking differently: Take 600 mg by mouth daily.) 60 tablet 5   ipratropium (ATROVENT) 0.06 % nasal spray Place 2 sprays into both nostrils 4 (four) times daily. 15 mL 3   lidocaine (XYLOCAINE) 5 % ointment Apply topically 3 (three) times daily as needed. 35.44 g 1    Lidocaine, Anorectal, 5 % CREA Apply 1 application topically 2 (two) times daily. (Patient taking differently: Apply 1 application  topically as needed.) 30 g 1   losartan (COZAAR) 25 MG tablet Take 1 tablet (25 mg total) by mouth daily. 90 tablet 3   mometasone-formoterol (DULERA) 100-5 MCG/ACT AERO Inhale 2 puffs into the lungs 2 (two) times daily. 13 g 5   montelukast (SINGULAIR) 10 MG tablet TAKE 1 TABLET BY MOUTH EVERYDAY AT BEDTIME 30 tablet 2   naproxen (NAPROSYN)  500 MG tablet Take 1 tablet (500 mg total) by mouth 2 (two) times daily. (Patient taking differently: Take 500 mg by mouth 2 (two) times daily. Taking as needed) 30 tablet 0   nicotine (NICODERM CQ - DOSED IN MG/24 HOURS) 14 mg/24hr patch Place 1 patch (14 mg total) onto the skin daily. 28 patch 0   omeprazole (PRILOSEC) 20 MG capsule TAKE 1 CAPSULE(20 MG) BY MOUTH TWICE DAILY BEFORE A MEAL FOR ACID REFLUX 180 capsule 0   polyethylene glycol (MIRALAX / GLYCOLAX) 17 g packet Take 17 g by mouth daily as needed. 14 each 0   potassium chloride SA (KLOR-CON M20) 20 MEQ tablet Take 1 tablet (20 mEq total) by mouth daily. 90 tablet 3   predniSONE (DELTASONE) 10 MG tablet TAKE 1 TABLET BY MOUTH EVERY DAY WITH BREAKFAST 30 tablet 3   roflumilast (DALIRESP) 500 MCG TABS tablet Take 1 tablet (500 mcg total) by mouth daily. 30 tablet 2   Testosterone 20.25 MG/1.25GM (1.62%) GEL Place 1.25 g onto the skin daily. 112.5 g 0   Tiotropium Bromide Monohydrate (SPIRIVA RESPIMAT) 2.5 MCG/ACT AERS Inhale 2 puffs into the lungs daily. 4 g 4   traMADol (ULTRAM) 50 MG tablet Take 1 tablet (50 mg total) by mouth every 8 (eight) hours as needed. 90 tablet 2   vitamin B-12 (CYANOCOBALAMIN) 1000 MCG tablet Take 1,000 mcg by mouth daily.     metolazone (ZAROXOLYN) 2.5 MG tablet Take 1 tablet (2.5 mg total) by mouth as directed. 5 tablet 1   Pitavastatin Calcium 2 MG TABS Take 1 tablet (2 mg total) by mouth daily. (Patient not taking: Reported on 01/13/2023) 90  tablet 1   torsemide (DEMADEX) 20 MG tablet Take 2 tablets (40 mg total) by mouth daily. 60 tablet 11   No current facility-administered medications for this visit.   Allergies  Allergen Reactions   Spironolactone Anaphylaxis   Daliresp [Roflumilast] Other (See Comments)    Dizzy, headache, leg pain per patient. 02/25/22.   Need to take in smaller doses    Jardiance [Empagliflozin] Nausea Only and Other (See Comments)    Lightheadness Dizziness   Penicillins Swelling and Other (See Comments)    Childhood rxn--MD stated he "almost died" Has patient had a PCN reaction causing immediate rash, facial/tongue/throat swelling, SOB or lightheadedness with hypotension:Yes Has patient had a PCN reaction causing severe rash involving mucus membranes or skin necrosis:unsure Has patient had a PCN reaction that required hospitalization:unsure Has patient had a PCN reaction occurring within the last 10 years:No If all of the above answers are "NO", then may proceed with Cephalosporin use.     Clindamycin Other (See Comments)    Tongue swelling   Doxycycline Nausea And Vomiting    Severe stomach upset per patient   E-Mycin [Erythromycin Base] Swelling   Rosuvastatin Other (See Comments)    Myalgias (intolerance)   Social History   Socioeconomic History   Marital status: Single    Spouse name: Not on file   Number of children: 1   Years of education: 12   Highest education level: Not on file  Occupational History   Occupation: disabled  Tobacco Use   Smoking status: Some Days    Packs/day: 2.00    Years: 46.00    Total pack years: 92.00    Types: Cigarettes    Last attempt to quit: 01/27/2022    Years since quitting: 0.9    Passive exposure: Past   Smokeless tobacco: Never  Tobacco comments:    Had 3 cigarettes in past 2 weeks ARJ 10/28/22  Vaping Use   Vaping Use: Never used  Substance and Sexual Activity   Alcohol use: Not Currently    Alcohol/week: 28.0 standard drinks of  alcohol    Types: 28 Cans of beer per week   Drug use: No   Sexual activity: Yes    Partners: Female    Birth control/protection: None  Other Topics Concern   Not on file  Social History Narrative   Right handed   Tea sometimes and coffee (1/2 caffeine)   Social Determinants of Health   Financial Resource Strain: Medium Risk (04/24/2021)   Overall Financial Resource Strain (CARDIA)    Difficulty of Paying Living Expenses: Somewhat hard  Food Insecurity: No Food Insecurity (04/16/2021)   Hunger Vital Sign    Worried About Running Out of Food in the Last Year: Never true    Ran Out of Food in the Last Year: Never true  Transportation Needs: No Transportation Needs (04/16/2021)   PRAPARE - Hydrologist (Medical): No    Lack of Transportation (Non-Medical): No  Physical Activity: Not on file  Stress: Not on file  Social Connections: Not on file  Intimate Partner Violence: Not on file   Family History  Problem Relation Age of Onset   Lung cancer Father    High blood pressure Mother    Diabetes Maternal Grandmother    Colon polyps Neg Hx    Esophageal cancer Neg Hx    Pancreatic cancer Neg Hx    Stomach cancer Neg Hx    Wt Readings from Last 3 Encounters:  01/18/23 234 lb (106.1 kg)  01/13/23 238 lb 12.8 oz (108.3 kg)  11/17/22 236 lb (107 kg)   BP (!) 134/93 (BP Location: Left Arm, Patient Position: Sitting, Cuff Size: Large)   Pulse 96   Resp 20   Wt 234 lb (106.1 kg)   SpO2 97%   BMI 33.58 kg/m   PHYSICAL EXAM: General:  NAD. No resp difficulty, walked into clinic HEENT: normal Neck: supple. no JVD. Carotids 2+ bilat; no bruits. No lymphadenopathy or thryomegaly appreciated. Cor: PMI nondisplaced. Regular rate & rhythm. No rubs, gallops or murmurs. Lungs: clear. Markedly decreased throughout Abdomen: obese soft, nontender, nondistended. No hepatosplenomegaly. No bruits or masses. Good bowel sounds. Extremities: no cyanosis, clubbing,  rash, edema Neuro: alert & orientedx3, cranial nerves grossly intact. moves all 4 extremities w/o difficulty. Affect pleasant  ASSESSMENT & PLAN: 1. Chronic systolic HF due to NICM  - cath 4/18 normal cors. EF 48%  - Echo 7/19  EF 30-35%. RV normal.  - Cardiac CT 9/19.  Calcium score 148 (74th percentile)  LAD 25% otherwise normal cors. Dilated pulmonary arteries suggestive of PH.  - Suspect NICM due to HTN and inadequately treated OSA (He now reports improved nightly compliance w/ CPAP). - Echo 1/22 EF 40-45%. RV mildly reduced. RVSP 49 - Admit 4/22 for symptomatic bradycardia/CHF. Echo LVEF 30%. RV mildly reduced - Echo 07/31/21 EF 30-35 % and RV moderately reduced.  - R/LHC 9/22 No CAD EF 35%, Mildly elevated filling pressures with normal output - Failed upgrade to CRT-D due to 99% RV pacing. However RV pacing significantly decreased.  - s/p Barostim. - now s/p BiV 11/23. - NYHA IIIB - Volume status mch improved with recent adjustments. Continue torsemide 20 daily - Take metolazone 2.5 mg x 1 today with extra 40 KCL prn for  fluid overload - Continue losartan 25 mg daily (dizzy and low BP with Entresto). - No spiro (H/o Anaphylaxis).  - Intolerant to Jardiance (positional dizziness and nausea).  - Intolerant bisoprolol due to fatigue, coreg due to dizziness, and toprol due to dry mouth.  - Concern for low output. Will plan RHC. May need to consider advanced therapies. Not a transplant candidate with COPD/smoking. He is willing to consider LVAD if necessary.   2. CHB:  - s/p PPM 4/22 - followed by Dr. Curt Bears  - s/p CRT-D upgrade 11/23  3. HTN - Well controlled   4. COPD  - GOLD Group D.  - Follows with Pulmonary ([name]).  - Home O2 PRN - Discussed need for smoking cessation  5. OSA - Continue Bipap  6. Morbid obesity  - Previously discussed GLP1RA but he is not interested - Body mass index is 33.58 kg/m.  7. Pulmonary Nodules - Followed by [name] - CT scan  3/22 stable, annual screening advised.   8. DVT - L brachial vein 12/23. - Now on Eliquis. No bleeding issues.  Glori Bickers, MD 3:48 PM

## 2023-01-18 NOTE — Patient Instructions (Signed)
Medication Changes:  None, Continue taking Metolazone as needed, make sure to take an extra 40 meq (2 tabs) of Potassium when you take metolazone  Lab Work:  Labs done today, your results will be available in MyChart, we will contact you for abnormal readings.   Testing/Procedures:  Heart Catheterization Fri 2/16, see instructions below  Referrals:  none  Special Instructions // Education:  Do the following things EVERYDAY: Weigh yourself in the morning before breakfast. Write it down and keep it in a log. Take your medicines as prescribed Eat low salt foods--Limit salt (sodium) to 2000 mg per day.  Stay as active as you can everyday Limit all fluids for the day to less than 2 liters   Follow-Up in: 4-6 weeks IN [place] OFFICE    CATHETERIZATION INSTRUCTIONS:  You are scheduled for a Cardiac Catheterization on Friday, February 16 with Dr. Glori Bickers.  1. Please arrive at the [facility] (Main Entrance A) at [facility]: [address] [place], [place] [address] at 10:00 AM  [place] parking service is available.   Special note: Every effort is made to have your procedure done on time. Please understand that emergencies sometimes delay scheduled procedures.  2. Diet: Do not eat solid foods after midnight.  The patient may have clear liquids until 5am upon the day of the procedure.  3. Labs: DONE TODAY  4. Medication instructions in preparation for your procedure:   FRIDAY 2/16 AM DO NOT TAKE: ELIQUIS OR TORSEMIDE  On the morning of your procedure, take any morning medicines NOT listed above.  You may use sips of water.  5. Plan for one night stay--bring personal belongings. 6. Bring a current list of your medications and current insurance cards. 7. You MUST have a responsible person to drive you home. 8. Someone MUST be with you the first 24 hours after you arrive home or your discharge will be delayed. 9. Please wear clothes that are easy to get  on and off and wear slip-on shoes.  Thank you for allowing [place] to care for you!   -- [facility]

## 2023-01-19 ENCOUNTER — Inpatient Hospital Stay: Admission: RE | Admit: 2023-01-19 | Payer: Medicare Other | Source: Ambulatory Visit

## 2023-01-19 ENCOUNTER — Encounter: Payer: Self-pay | Admitting: Internal Medicine

## 2023-01-19 ENCOUNTER — Inpatient Hospital Stay
Admission: RE | Admit: 2023-01-19 | Discharge: 2023-01-19 | Disposition: A | Discharge status: Home / Self Care | Payer: Medicare Other | Source: Ambulatory Visit | Attending: Physical Medicine and Rehabilitation | Admitting: Physical Medicine and Rehabilitation

## 2023-01-19 ENCOUNTER — Other Ambulatory Visit: Payer: Medicare Other

## 2023-01-19 NOTE — Discharge Instructions (Signed)

## 2023-01-21 DIAGNOSIS — M170 Bilateral primary osteoarthritis of knee: Secondary | ICD-10-CM | POA: Diagnosis not present

## 2023-01-21 DIAGNOSIS — J449 Chronic obstructive pulmonary disease, unspecified: Secondary | ICD-10-CM | POA: Diagnosis not present

## 2023-01-21 DIAGNOSIS — J9610 Chronic respiratory failure, unspecified whether with hypoxia or hypercapnia: Secondary | ICD-10-CM | POA: Diagnosis not present

## 2023-01-22 NOTE — Telephone Encounter (Signed)
I RE-FAXED CLEARANCE NOTES AGAIN TODAY.  CLEARANCE HAS BEEN FAXED x 3 TO [facility].

## 2023-01-22 NOTE — Telephone Encounter (Signed)
[facility] is calling again stating they never received clearance and is requesting one be sent again.   Fax # [phone]

## 2023-01-27 ENCOUNTER — Other Ambulatory Visit: Payer: Medicare Other

## 2023-01-28 ENCOUNTER — Telehealth: Payer: Self-pay | Admitting: Physical Medicine and Rehabilitation

## 2023-01-28 ENCOUNTER — Encounter (HOSPITAL_COMMUNITY): Payer: Self-pay | Admitting: *Deleted

## 2023-01-28 DIAGNOSIS — M722 Plantar fascial fibromatosis: Secondary | ICD-10-CM | POA: Diagnosis not present

## 2023-01-28 NOTE — Telephone Encounter (Signed)
Patient returned call asked for a call back. The number to contact patient is [phone]

## 2023-01-28 NOTE — Telephone Encounter (Addendum)
Spoke with patient and he is wanting Dr. Ernestina Patches to order a CT of his back. Informed patient that Dr. Nelva Bush has already order a CT of cervical and lumbar spine. Patient states he does not have anyone to bring him. Informed patient he would need to call [facility] to reschedule. Patient verbalized understanding.

## 2023-01-28 NOTE — Telephone Encounter (Signed)
LVM for patient to return call to clinic

## 2023-01-28 NOTE — Telephone Encounter (Signed)
Patient states he needs to speak to someone about and appointment with [facility] and he has other questions.Marland Kitchen

## 2023-02-02 ENCOUNTER — Other Ambulatory Visit: Payer: Medicare Other

## 2023-02-02 DIAGNOSIS — M9902 Segmental and somatic dysfunction of thoracic region: Secondary | ICD-10-CM | POA: Diagnosis not present

## 2023-02-02 DIAGNOSIS — M5134 Other intervertebral disc degeneration, thoracic region: Secondary | ICD-10-CM | POA: Diagnosis not present

## 2023-02-02 DIAGNOSIS — M5032 Other cervical disc degeneration, mid-cervical region, unspecified level: Secondary | ICD-10-CM | POA: Diagnosis not present

## 2023-02-02 DIAGNOSIS — M9905 Segmental and somatic dysfunction of pelvic region: Secondary | ICD-10-CM | POA: Diagnosis not present

## 2023-02-02 DIAGNOSIS — M9901 Segmental and somatic dysfunction of cervical region: Secondary | ICD-10-CM | POA: Diagnosis not present

## 2023-02-02 DIAGNOSIS — M5137 Other intervertebral disc degeneration, lumbosacral region: Secondary | ICD-10-CM | POA: Diagnosis not present

## 2023-02-04 DIAGNOSIS — M1711 Unilateral primary osteoarthritis, right knee: Secondary | ICD-10-CM | POA: Diagnosis not present

## 2023-02-05 ENCOUNTER — Ambulatory Visit (HOSPITAL_COMMUNITY): Admission: RE | Admit: 2023-02-05 | Payer: Medicare Other | Source: Home / Self Care | Admitting: Internal Medicine

## 2023-02-05 ENCOUNTER — Encounter (HOSPITAL_COMMUNITY): Admission: RE | Payer: Self-pay | Source: Home / Self Care

## 2023-02-05 SURGERY — RIGHT HEART CATH
Anesthesia: LOCAL

## 2023-02-06 ENCOUNTER — Telehealth: Payer: Self-pay | Admitting: Pulmonary Disease

## 2023-02-06 IMAGING — DX DG LUMBAR SPINE COMPLETE 4+V
5 series · 5 of 5 positions shown · non-contrast
Comparison: None Available.

CLINICAL DATA: Low back pain status post fall.

EXAM:
LUMBAR SPINE - COMPLETE 4+ VIEW

[l-spine ap]
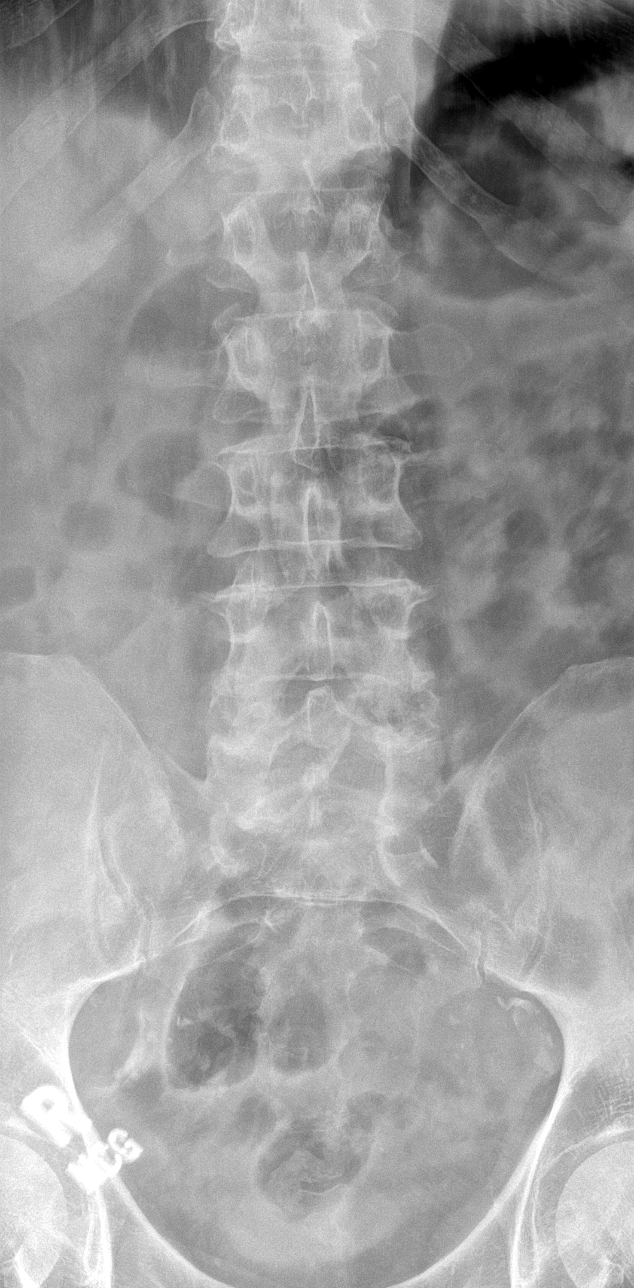

[l-spine obl (1 of 2)]
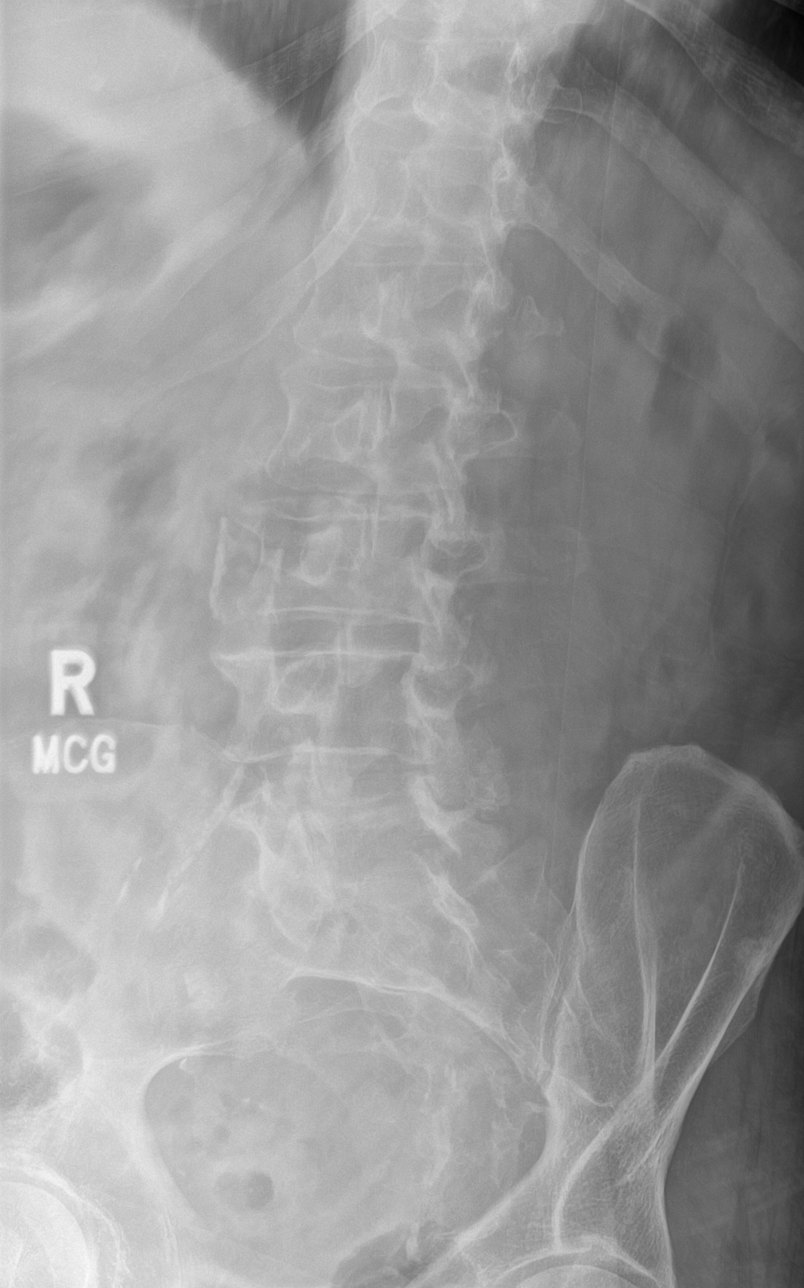

[l-spine obl (2 of 2)]
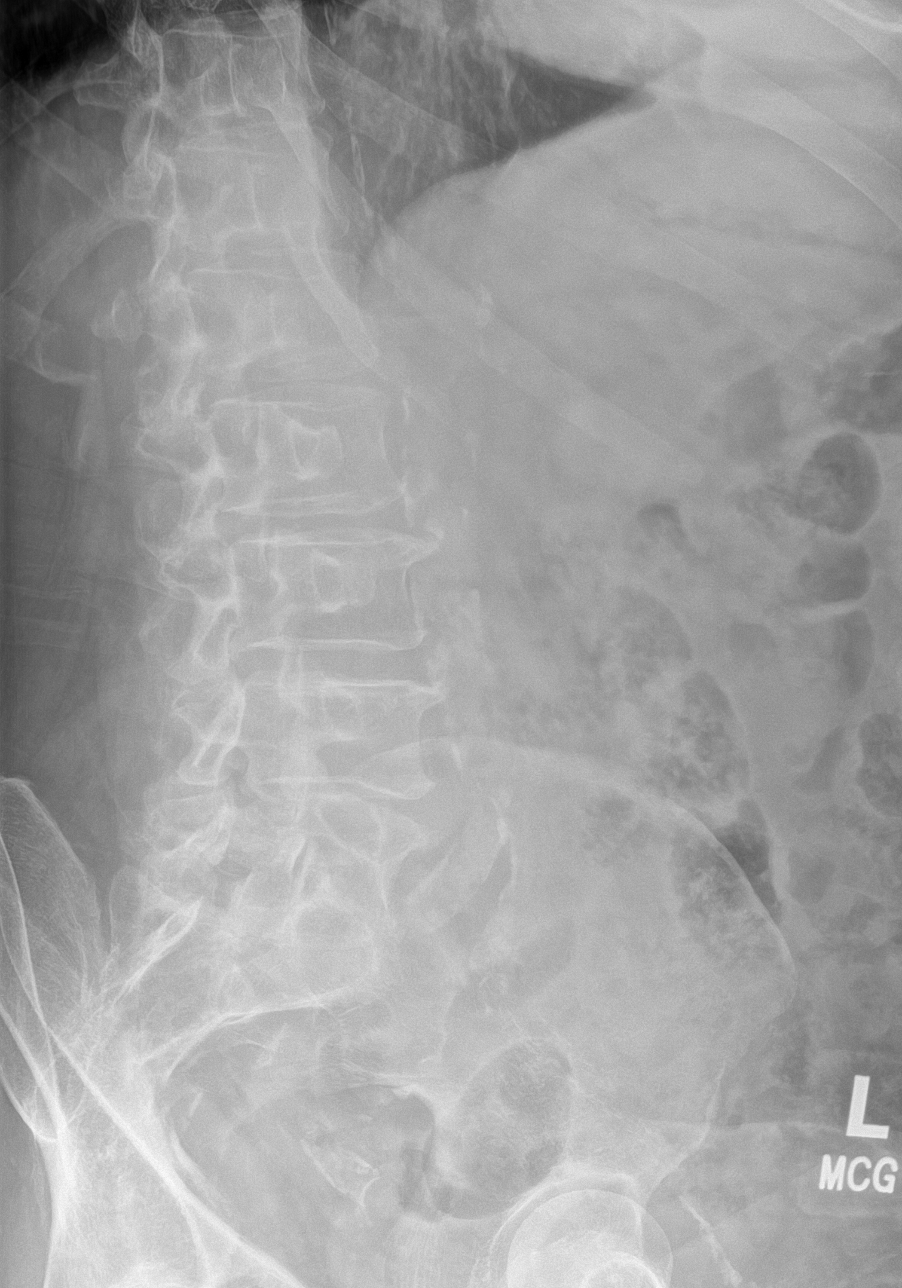

[l-spine lateral]
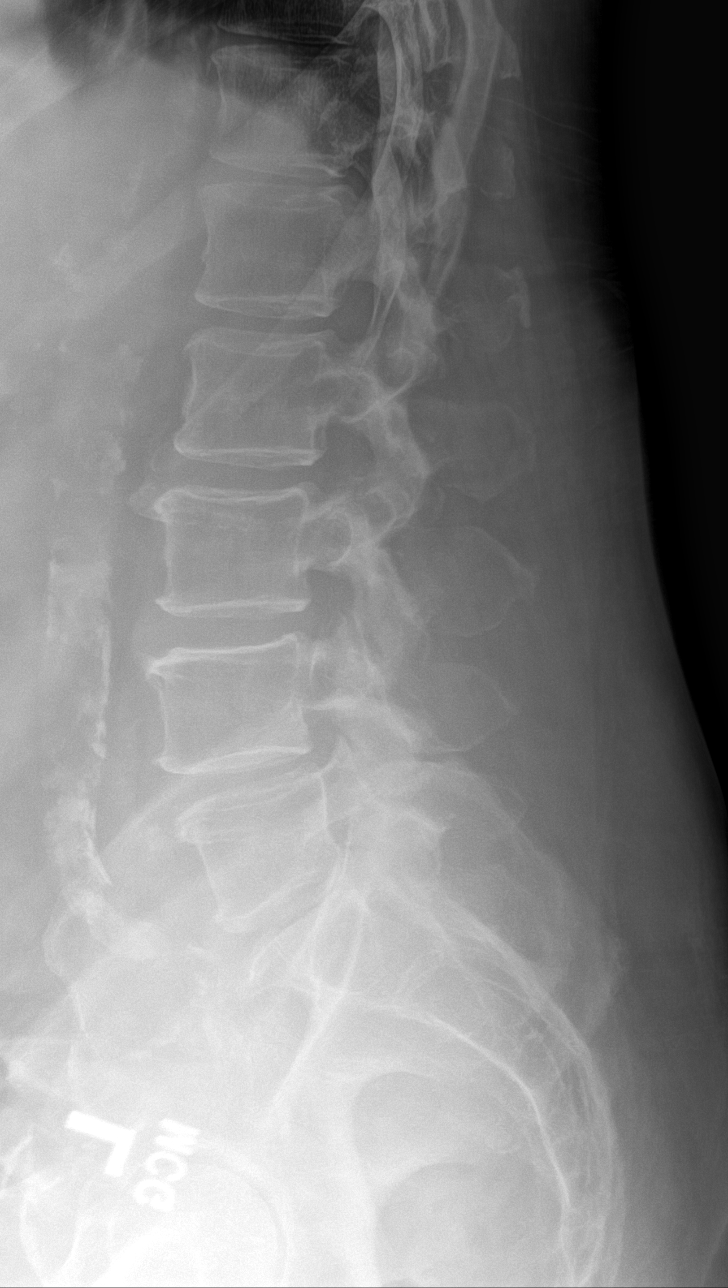

[l-spine spot]
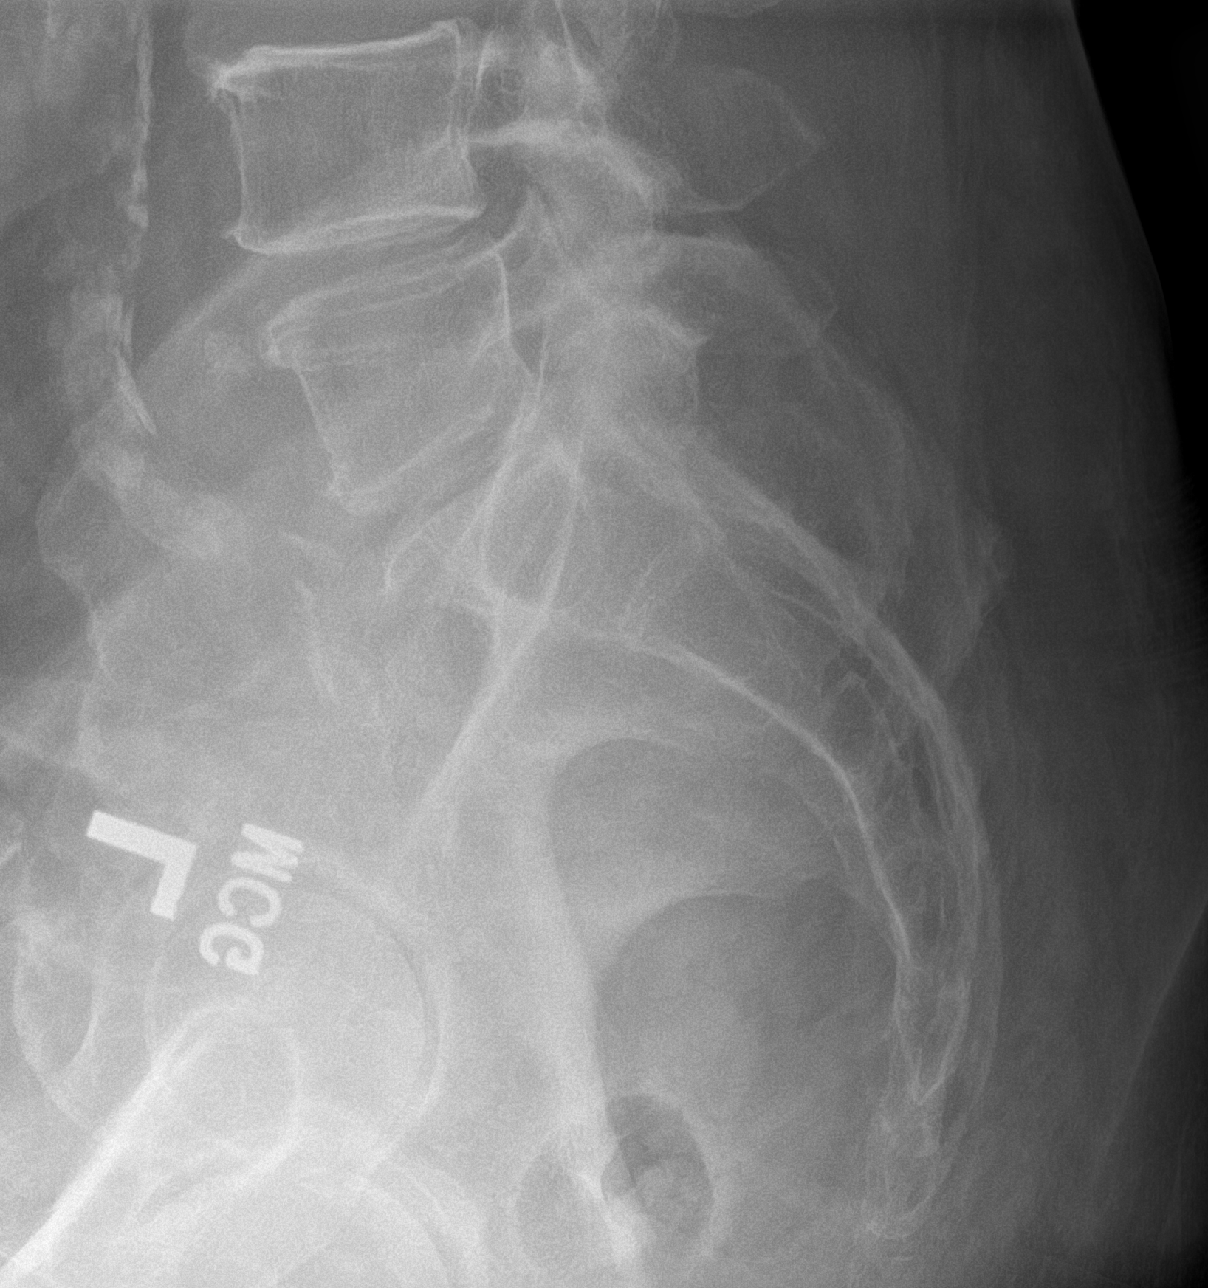

[5 of 5 positions shown; findings below may reference images not displayed]

FINDINGS: No fracture. Mild degenerative retrolisthesis L2 on L3 noted. Loss
of disc space height seen at L4-5. Lower lumbar facet degenerative
disease noted. Paraspinous structures demonstrate aortic
atherosclerosis.
IMPRESSION: No acute abnormality.

Mild appearing lumbar spondylosis.

Aortic Atherosclerosis ([id number]).

## 2023-02-06 IMAGING — DX DG CHEST 2V
3 series · 3 of 3 positions shown · non-contrast
Comparison: 05/21/2022

CLINICAL DATA: 65-year-old male with syncope

EXAM:
CHEST - 2 VIEW

[chest pa (1 of 2)]
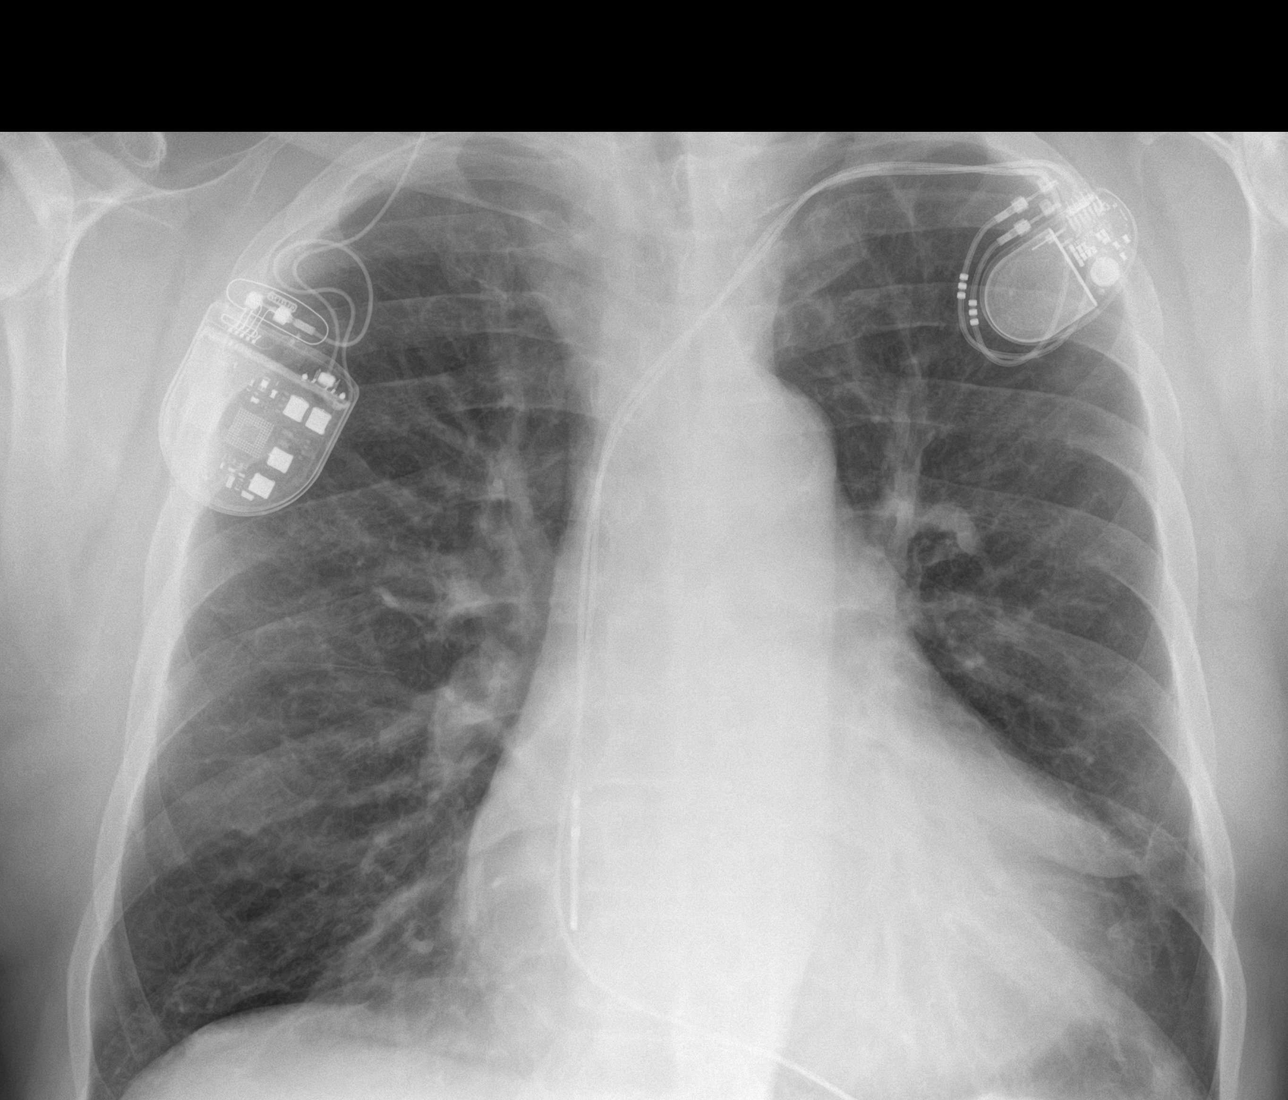

[chest lat]
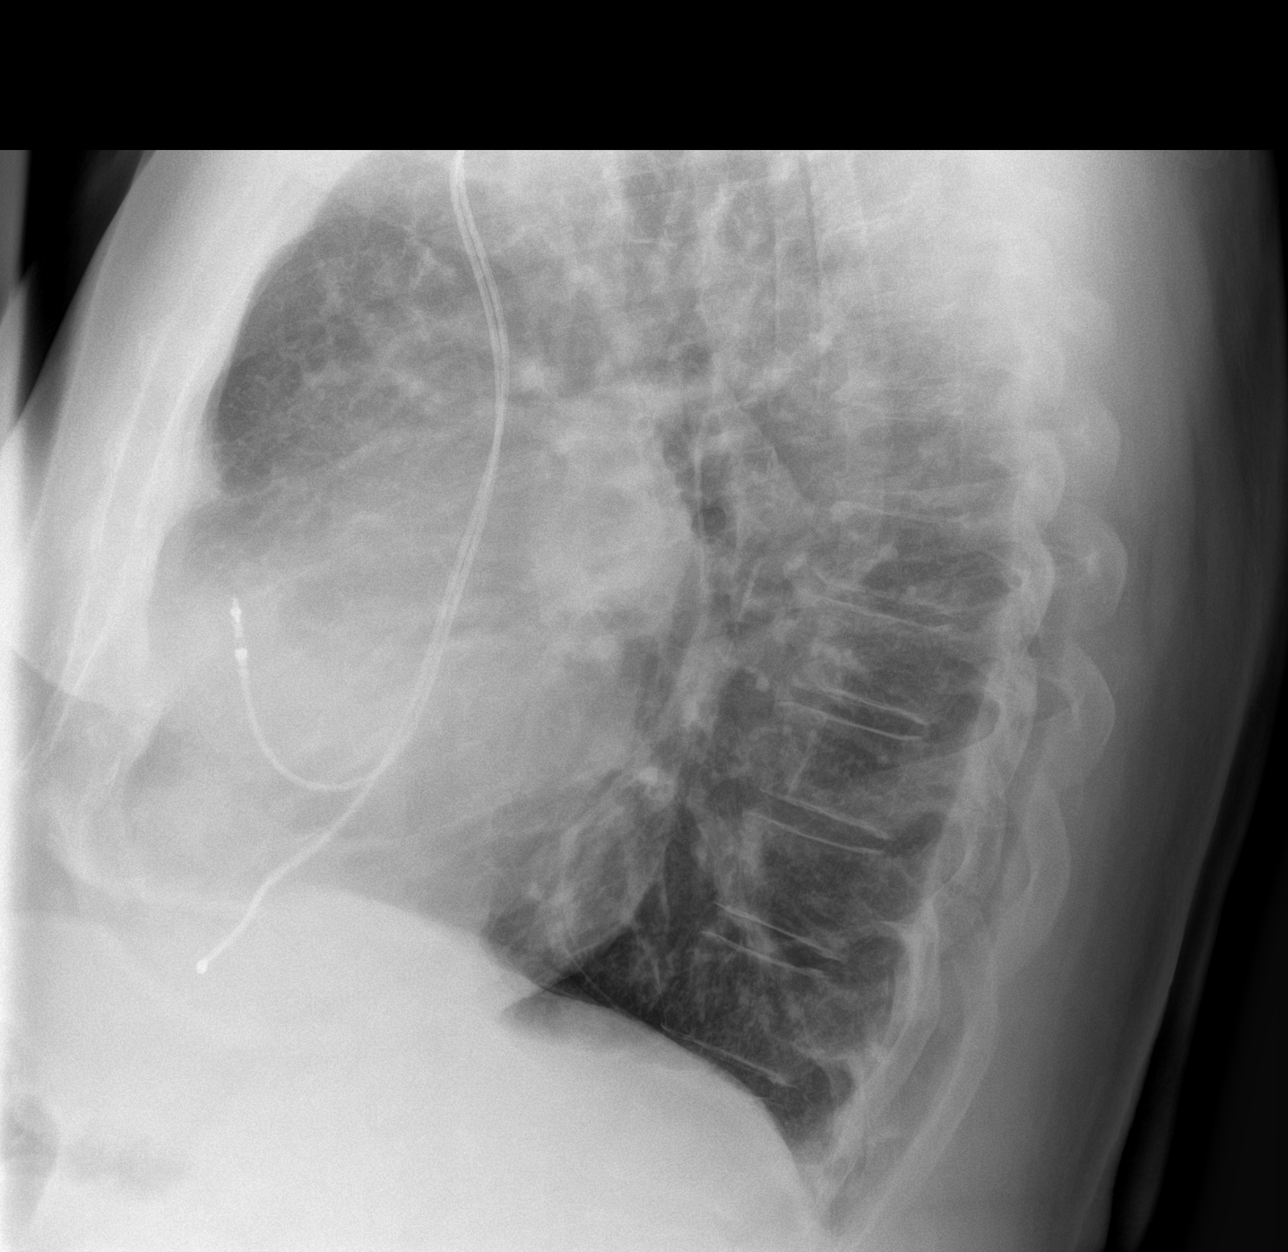

[chest pa (2 of 2)]
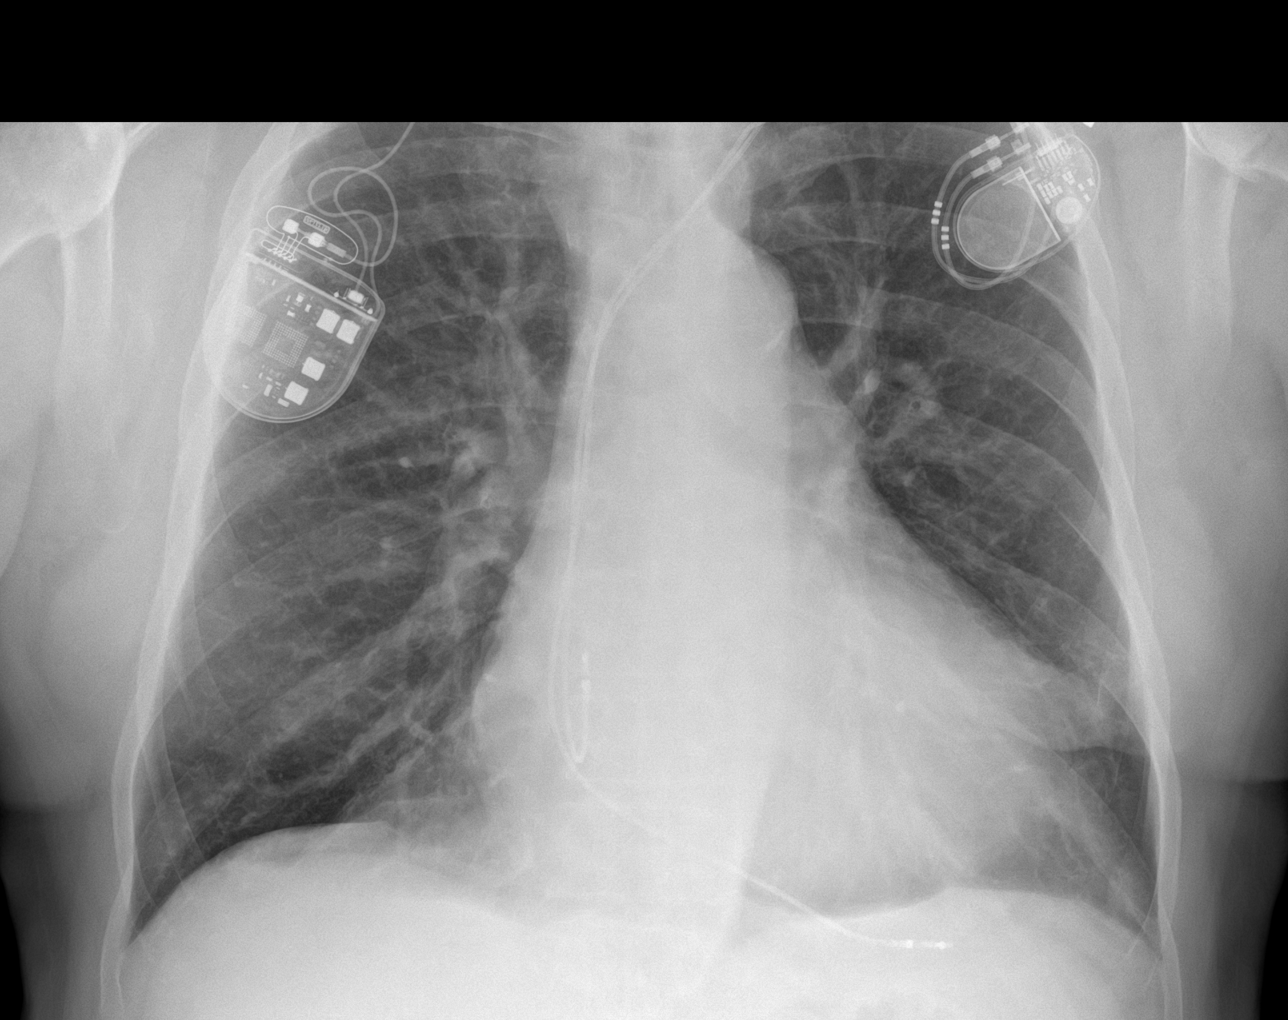

[3 of 3 positions shown; findings below may reference images not displayed]

FINDINGS: Cardiomediastinal silhouette unchanged in size and contour. No
evidence of central vascular congestion. No interlobular septal
thickening.

Unchanged appearance of right chest wall generator with the leads
terminating in the neck region.

Unchanged left chest wall pacing device with 2 leads in place.

No pneumothorax or pleural effusion. Coarsened interstitial
markings, with no confluent airspace disease.

No acute displaced fracture. Degenerative changes of the spine.
IMPRESSION: Negative for acute cardiopulmonary disease

## 2023-02-07 DIAGNOSIS — R519 Headache, unspecified: Secondary | ICD-10-CM | POA: Diagnosis not present

## 2023-02-07 DIAGNOSIS — R0602 Shortness of breath: Secondary | ICD-10-CM | POA: Diagnosis not present

## 2023-02-07 DIAGNOSIS — R051 Acute cough: Secondary | ICD-10-CM | POA: Diagnosis not present

## 2023-02-07 DIAGNOSIS — U071 COVID-19: Secondary | ICD-10-CM | POA: Diagnosis not present

## 2023-02-07 DIAGNOSIS — J449 Chronic obstructive pulmonary disease, unspecified: Secondary | ICD-10-CM | POA: Diagnosis not present

## 2023-02-08 ENCOUNTER — Telehealth: Payer: Self-pay | Admitting: Emergency Medicine

## 2023-02-08 ENCOUNTER — Telehealth (HOSPITAL_COMMUNITY): Payer: Self-pay

## 2023-02-08 NOTE — Telephone Encounter (Signed)
He called and needs to reschedule cath. He cancelled due to covid.

## 2023-02-08 NOTE — Telephone Encounter (Signed)
ATC X1 LVM for patient to give the office back

## 2023-02-09 ENCOUNTER — Ambulatory Visit: Payer: Medicare Other | Admitting: Cardiovascular Disease

## 2023-02-09 MED ORDER — MOLNUPIRAVIR 200 MG PO CAPS: 4.0000 | ORAL_CAPSULE | Freq: Two times a day (BID) | ORAL | 0 refills | Status: AC

## 2023-02-09 MED ORDER — ALBUTEROL SULFATE HFA 108 (90 BASE) MCG/ACT IN AERS: INHALATION_SPRAY | RESPIRATORY_TRACT | 4 refills | Status: DC

## 2023-02-09 NOTE — Addendum Note (Signed)
Addended by: Rosana Berger on: 02/09/2023 12:19 PM   Modules accepted: Orders

## 2023-02-09 NOTE — Telephone Encounter (Signed)
Spoke with the pt and notified of response per Dr Lamonte Sakai  He verbalized understanding  Rx for molnupiravir sent to pharm

## 2023-02-09 NOTE — Telephone Encounter (Signed)
Paxlovid interacts with prednisone, decreases the effectiveness of both.  Because he is on scheduled prednisone, he would likely do better on molnupiravir 800 mg twice daily for 5 days.

## 2023-02-09 NOTE — Telephone Encounter (Signed)
Patient is returning a call from earlier.  Read the message to patient and he understood, but also wanted to know if this med would still be ok because he is also taking Elliquis.  Please advise and call patient to discuss.  [phone]

## 2023-02-09 NOTE — Addendum Note (Signed)
Addended by: Rosana Berger on: 02/09/2023 11:29 AM   Modules accepted: Orders

## 2023-02-09 NOTE — Telephone Encounter (Addendum)
Called and spoke with the pt He is c/o increased SOB, cough with yellow sputum and HA x 5 days  He tested pos for covid 19 yesterday  He is asking for rx for paxlovid  Please advise thanks!  Note that pt is on pred 10 mg daily, dulera, spiriva, loratidine, albuterol hfa/nebs

## 2023-02-10 ENCOUNTER — Telehealth: Payer: Self-pay | Admitting: Emergency Medicine

## 2023-02-10 NOTE — Telephone Encounter (Signed)
Pt. Calling about poss. getting portable O2 tank on the go for him

## 2023-02-11 ENCOUNTER — Ambulatory Visit: Payer: Medicare Other | Admitting: Cardiology

## 2023-02-11 NOTE — Telephone Encounter (Signed)
Called and spoke to patient.  He is requesting order for POC.  He is aware that an appointment and walk test is needed. Last seen 10/2022 with no walk test on file.  Appt scheduled 02/16/2023 at 10:15. Nothing further needed.

## 2023-02-12 ENCOUNTER — Encounter (HOSPITAL_COMMUNITY): Payer: Self-pay

## 2023-02-12 ENCOUNTER — Telehealth: Payer: Self-pay | Admitting: Emergency Medicine

## 2023-02-12 ENCOUNTER — Telehealth (HOSPITAL_COMMUNITY): Payer: Self-pay

## 2023-02-12 NOTE — Telephone Encounter (Signed)
Left message for patient to call back  

## 2023-02-12 NOTE — Progress Notes (Unsigned)
New catheterization instructions mailed to patient.

## 2023-02-12 NOTE — Telephone Encounter (Signed)
Called patient to reschedule catheterization. Scheduled for Friday March 1,2024'@4'$ :30. Patient to arrive at 2:30 at entrance A.Aware of holding Eliquis and torsemide day of procedure. Has transportation to and from procedure

## 2023-02-12 NOTE — Telephone Encounter (Signed)
Pt. Calling back about molnupiravir the pharmacy said they need a prior Auth to get the cost down on meds

## 2023-02-15 ENCOUNTER — Telehealth (HOSPITAL_COMMUNITY): Payer: Self-pay | Admitting: Vascular Surgery

## 2023-02-15 ENCOUNTER — Ambulatory Visit: Payer: Medicare Other

## 2023-02-15 DIAGNOSIS — I442 Atrioventricular block, complete: Secondary | ICD-10-CM | POA: Diagnosis not present

## 2023-02-15 NOTE — Telephone Encounter (Signed)
Called pt to r/s no answer/left vm for return call

## 2023-02-15 NOTE — Telephone Encounter (Signed)
Pt want to resch cath w/ db 3/1

## 2023-02-16 ENCOUNTER — Encounter: Payer: Self-pay | Admitting: Emergency Medicine

## 2023-02-16 ENCOUNTER — Telehealth (HOSPITAL_COMMUNITY): Payer: Self-pay

## 2023-02-16 ENCOUNTER — Ambulatory Visit: Payer: Medicare Other | Admitting: Emergency Medicine

## 2023-02-16 VITALS — BP 110/68 | HR 84 | Temp 97.6°F | Ht 70.0 in | Wt 238.0 lb

## 2023-02-16 DIAGNOSIS — G4733 Obstructive sleep apnea (adult) (pediatric): Secondary | ICD-10-CM

## 2023-02-16 DIAGNOSIS — F17210 Nicotine dependence, cigarettes, uncomplicated: Secondary | ICD-10-CM

## 2023-02-16 DIAGNOSIS — J449 Chronic obstructive pulmonary disease, unspecified: Secondary | ICD-10-CM | POA: Diagnosis not present

## 2023-02-16 DIAGNOSIS — I5042 Chronic combined systolic (congestive) and diastolic (congestive) heart failure: Secondary | ICD-10-CM

## 2023-02-16 DIAGNOSIS — J302 Other seasonal allergic rhinitis: Secondary | ICD-10-CM

## 2023-02-16 DIAGNOSIS — J9611 Chronic respiratory failure with hypoxia: Secondary | ICD-10-CM | POA: Diagnosis not present

## 2023-02-16 LAB — CUP PACEART REMOTE DEVICE CHECK
Battery Remaining Longevity: 90 mo
Battery Remaining Percentage: 93 %
Battery Voltage: 2.99 V
Brady Statistic AP VP Percent: 1 %
Brady Statistic AP VS Percent: 1 %
Brady Statistic AS VP Percent: 98 %
Brady Statistic AS VS Percent: 1.9 %
Brady Statistic RA Percent Paced: 1 %
Date Time Interrogation Session: 20240226020013
Implantable Lead Connection Status: 753985
Implantable Lead Connection Status: 753985
Implantable Lead Connection Status: 753985
Implantable Lead Implant Date: 20220427
Implantable Lead Implant Date: 20220427
Implantable Lead Implant Date: 20231122
Implantable Lead Location: 753858
Implantable Lead Location: 753859
Implantable Lead Location: 753860
Implantable Pulse Generator Implant Date: 20231122
Lead Channel Impedance Value: 480 Ohm
Lead Channel Impedance Value: 490 Ohm
Lead Channel Impedance Value: 610 Ohm
Lead Channel Pacing Threshold Amplitude: 0.625 V
Lead Channel Pacing Threshold Amplitude: 0.625 V
Lead Channel Pacing Threshold Amplitude: 1 V
Lead Channel Pacing Threshold Pulse Width: 0.5 ms
Lead Channel Pacing Threshold Pulse Width: 0.5 ms
Lead Channel Pacing Threshold Pulse Width: 0.5 ms
Lead Channel Sensing Intrinsic Amplitude: 2.5 mV
Lead Channel Sensing Intrinsic Amplitude: 8.2 mV
Lead Channel Setting Pacing Amplitude: 1.625
Lead Channel Setting Pacing Amplitude: 2 V
Lead Channel Setting Pacing Amplitude: 2 V
Lead Channel Setting Pacing Pulse Width: 0.5 ms
Lead Channel Setting Pacing Pulse Width: 0.5 ms
Lead Channel Setting Sensing Sensitivity: 2 mV
Pulse Gen Model: 3222
Pulse Gen Serial Number: 3979797

## 2023-02-16 MED ORDER — LEVOFLOXACIN 500 MG PO TABS: 500.0000 mg | ORAL_TABLET | Freq: Every day | ORAL | 0 refills | Status: DC

## 2023-02-16 NOTE — Assessment & Plan Note (Signed)
Continue your fluticasone and Atrovent nasal sprays. Continue nasal saline rinses as you have been doing them

## 2023-02-16 NOTE — Assessment & Plan Note (Signed)
Continue diuretics and follow-up with advanced heart failure clinic as planned

## 2023-02-16 NOTE — Assessment & Plan Note (Signed)
Get your lung cancer screening CT scan 03/17/2023 as planned

## 2023-02-16 NOTE — Assessment & Plan Note (Addendum)
Increase purulent sputum production, likely brought on by recent COVID.  He did not receive antiviral (PA was required by his insurance).  Will treat with a short course of levofloxacin which usually helps him.  Otherwise continue his maintenance regimen  Please take Levaquin as prescribed until completely gone Continue prednisone 10 mg once daily. Continue your Spiriva and [name] as you have been taking them.  Rinse and gargle after the Dulera Continue Singulair 10 mg each evening Continue Daliresp once daily Follow with Dr Lamonte Sakai in 6 months or sooner if you have any problems

## 2023-02-16 NOTE — Telephone Encounter (Signed)
Patient called with questions regarding cath on Friday.  Please call.

## 2023-02-16 NOTE — Patient Instructions (Addendum)
Please take Levaquin as prescribed until completely gone Continue prednisone 10 mg once daily. Use your trilogy ventilator with oxygen bled in every night while sleeping Use your oxygen at 2 L/min with exertion.  We will plan to perform a walking oximetry to qualify you for a portable oxygen concentrator at some point in the future when you believe you can perform a walk. Continue your Spiriva and [name] as you have been taking them.  Rinse and gargle after the Dulera Continue Singulair 10 mg each evening Continue Daliresp once daily Continue your fluticasone and Atrovent nasal sprays. Continue nasal saline rinses as you have been doing them Get your lung cancer screening CT scan 03/17/2023 as planned Continue diuretics and follow-up with advanced heart failure clinic as planned Follow with Dr Lamonte Sakai in 6 months or sooner if you have any problems

## 2023-02-16 NOTE — Assessment & Plan Note (Signed)
Sleep apnea as well as chronic hypoxic and hypercapnic respiratory failure due to his cardiopulmonary issues.  He tolerates and is benefiting from his trilogy ventilator.  Continue same

## 2023-02-16 NOTE — Progress Notes (Signed)
Subjective:  Patient ID: ARISTON MOERS, male    DOB: Jul 12, 1956  Age: 67 y.o. MRN: [id number]  CC:  Chief Complaint  Patient presents with   Follow-up    Pt does not want to do walk, pt wants a cxr    HPI  ROV 02/16/2023 --67 year old gentleman with multifactorial chronic hypoxemic respiratory failure.  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  It looks like he is scheduled for left and right heart catheterization on 02/19/2023 with Dr. Haroldine Laws. We follow him for severe COPD.  Unfortunately he continues to smoke.  Currently managed on nocturnal trilogy ventilator with oxygen bled in (4 L/min, documented on overnight oximetry).  He has oxygen available to use 2 L/min with exertion.  He wants to be qualified for POC.  Currently managed on Spiriva plus Dulera, Singulair, prednisone 10 mg, Daliresp.  He also uses fluticasone and Atrovent nasal sprays, omeprazole twice daily. He is using nasal saline.  He had COVID-19, tested + 02/08/2023.  I had ordered molnupiravir for him, didn't get it because it never got a PA. His sleep is poor, still has low energy, coughing up some white mucous. He is having sciatic pain and cannot do a walking oximetry today.  Due for LDCT 03/17/23   Objective:   Today's Vitals:  BP 110/68   Pulse 84   Temp 97.6 F (36.4 C) (Oral)   Ht '5\' 10"'$  (1.778 m)   Wt 238 lb (108 kg)   SpO2 91%   BMI 34.15 kg/m   Gen: Pleasant, well-nourished, in no distress,  normal affect  ENT: No lesions,  mouth clear,  oropharynx clear, no postnasal drip  Neck: No JVD, no stridor  Lungs: No use of accessory muscles, distant, no crackles, few B exp wheezes w cough.   Cardiovascular: RRR, heart sounds normal, no murmur or gallops, no peripheral edema  Musculoskeletal: No deformities, no cyanosis or clubbing  Neuro: alert, awake, non focal  Skin: Warm, no lesions or  rash   Assessment & Plan:  COPD (chronic obstructive pulmonary disease) (HCC) Increase purulent sputum production, likely brought on by recent COVID.  He did not receive antiviral (PA was required by his insurance).  Will treat with a short course of levofloxacin which usually helps him.  Otherwise continue his maintenance regimen  Please take Levaquin as prescribed until completely gone Continue prednisone 10 mg once daily. Continue your Spiriva and [name] as you have been taking them.  Rinse and gargle after the Dulera Continue Singulair 10 mg each evening Continue Daliresp once daily Follow with Dr Lamonte Sakai in 6 months or sooner if you have any problems  Obstructive sleep apnea syndrome Sleep apnea as well as chronic hypoxic and hypercapnic respiratory failure due to his cardiopulmonary issues.  He tolerates and is benefiting from his trilogy ventilator.  Continue same  Chronic respiratory failure with hypoxia ([place]) He does not use his exertional oxygen as reliably as he should, principally due to cumbersome equipment.  He was coming in today to do a walking oximetry for POC, states that he cannot do this due to severe sciatica.  Will have to reschedule it.  Seasonal allergic rhinitis Continue your fluticasone and Atrovent nasal sprays. Continue nasal saline rinses as you have been doing them  Chronic combined systolic and diastolic CHF (congestive heart failure) (HCC) Continue diuretics and follow-up with advanced heart failure clinic as  planned  Cigarette smoker Get your lung cancer screening CT scan 03/17/2023 as planned    Baltazar Apo, MD, PhD 02/16/2023, 11:03 AM [facility] [phone] or if no answer [phone]

## 2023-02-16 NOTE — Assessment & Plan Note (Signed)
He does not use his exertional oxygen as reliably as he should, principally due to cumbersome equipment.  He was coming in today to do a walking oximetry for POC, states that he cannot do this due to severe sciatica.  Will have to reschedule it.

## 2023-02-19 ENCOUNTER — Encounter (HOSPITAL_COMMUNITY): Admission: RE | Payer: Self-pay | Source: Home / Self Care

## 2023-02-19 ENCOUNTER — Ambulatory Visit (HOSPITAL_COMMUNITY): Admission: RE | Admit: 2023-02-19 | Payer: Medicare Other | Source: Home / Self Care | Admitting: Internal Medicine

## 2023-02-19 ENCOUNTER — Encounter (HOSPITAL_COMMUNITY): Payer: Self-pay

## 2023-02-19 ENCOUNTER — Telehealth (HOSPITAL_COMMUNITY): Payer: Self-pay | Admitting: Cardiology

## 2023-02-19 ENCOUNTER — Encounter (HOSPITAL_COMMUNITY): Payer: Self-pay | Admitting: Cardiology

## 2023-02-19 ENCOUNTER — Encounter (HOSPITAL_COMMUNITY): Payer: Medicare Other

## 2023-02-19 DIAGNOSIS — J449 Chronic obstructive pulmonary disease, unspecified: Secondary | ICD-10-CM | POA: Diagnosis not present

## 2023-02-19 DIAGNOSIS — I5042 Chronic combined systolic (congestive) and diastolic (congestive) heart failure: Secondary | ICD-10-CM | POA: Diagnosis not present

## 2023-02-19 SURGERY — RIGHT HEART CATH
Anesthesia: LOCAL

## 2023-02-19 NOTE — Telephone Encounter (Signed)
S/p coivd per pt would like to r/s cath See details below Pt aware and instructions sent via my chart   You are scheduled for a Cardiac Catheterization on Tuesday March 19 with Dr. Glori Bickers.   1. Please arrive at the [facility] (Main Entrance A) at [facility]: [address] [place], [place] [address] at 9: [place] parking service is available.    Special note: Every effort is made to have your procedure done on time. Please understand that emergencies sometimes delay scheduled procedures.   2. Diet: Do not eat solid foods after midnight.  The patient may have clear liquids until 5am upon the day of the procedure.   3. Labs: DONE    4. Medication instructions in preparation for your procedure:              Tuesday 3/19 AM DO NOT TAKE: ELIQUIS OR TORSEMIDE   On the morning of your procedure, take any morning medicines NOT listed above.  You may use sips of water.   5. Plan for one night stay--bring personal belongings. 6. Bring a current list of your medications and current insurance cards. 7. You MUST have a responsible person to drive you home. 8. Someone MUST be with you the first 24 hours after you arrive home or your discharge will be delayed. 9. Please wear clothes that are easy to get on and off and wear slip-on shoes.   Thank you for allowing [place] to care for you!   -- [facility]

## 2023-02-21 NOTE — Telephone Encounter (Signed)
Can we F/U on pt. About meds.

## 2023-02-24 DIAGNOSIS — M47812 Spondylosis without myelopathy or radiculopathy, cervical region: Secondary | ICD-10-CM | POA: Diagnosis not present

## 2023-02-24 DIAGNOSIS — Z6833 Body mass index (BMI) 33.0-33.9, adult: Secondary | ICD-10-CM | POA: Diagnosis not present

## 2023-02-24 DIAGNOSIS — M47816 Spondylosis without myelopathy or radiculopathy, lumbar region: Secondary | ICD-10-CM | POA: Diagnosis not present

## 2023-02-24 NOTE — Progress Notes (Signed)
Cardiology Clinic Note   Date: 02/26/2023 ID: Robert Church, DOB Jan 06, 1956, MRN [id number]  Primary Cardiologist:  Quay Burow, MD  Patient Profile    Robert Church is a 67 y.o. male who presents to the clinic today for follow-up prior to [place].  Past medical history significant for: Nonobstructive CAD. Coronary CTA 08/26/2018: Calcium score 148 (74th percentile) study affected by significant motion.  Mild nonobstructive plaque. LHC 08/22/2021: Normal coronary arteries. Chronic combined systolic and diastolic heart failure/nonischemic cardiomyopathy. [place] 08/22/2021: Mildly elevated filling pressure with normal output.  EF 30 to 35%. Unsuccessful BiV implantation 10/07/2021: Unsuccessful secondary subclavian stenosis. Insertion of right-sided Barostim device 01/29/2022. BiV implantation 11/11/2022: Successful left bundle area lead implant, BiV pacemaker implant. Echo 01/13/2023: EF <20%.  Global hypokinesis.  Severely dilated LV internal cavity.  Moderate concentric LVH.  Mildly reduced RV systolic function.  Mild LAE.  Moderate RAE.  Mild MR.  Mild aortic stenosis.  Dilated IVC.  RA pressure 15 mmHg. Remote device check 02/15/2023: Normal device function.  Battery and lead parameters stable. Symptomatic bradycardia/third-degree AV block. 04/08/2021: Reported by (following her cardiac event monitor-third-degree AVB with junctional escape rhythm, sinus tachycardia with bigeminal PVCs/artifact. Presented to ER 04/15/2021 with complaints of presyncope and dizziness. Pacemaker implantation 04/16/2021: Walls dual-chamber pacemaker.   Successfully upgraded to BiV 11/11/2022. PAF/A-flutter. Onset 04/10/2021 as reported by [employer] while wearing cardiac event monitor.  Dr. Berneice Gandy the day reviewed transmission and felt it was a flutter, started patient on Eliquis and referred to A-fib clinic. Eliquis stopped 04/17/2021, as A-fib was not convincing per Dr. Curt Bears. Carotid artery disease. Carotid ultrasound  12/18/2021: Bilateral ICA 1 to 39% stenosis. Hypertension. Hyperlipidemia. Pulmonary hypertension. Coronary CTA 08/29/2018: Dilated pulmonary artery measuring 34 mm suggestive of pulmonary hypertension. Suspected DVT left upper extremity. Ultrasound 11/15/2022: No definitive evidence of DVT although exam limited secondary to arm could not be abducted. Per recommendation of Dr. Curt Bears started on Eliquis for minimum of 6 months. T2DM. COPD. OSA. BiPap 100% adherence.  GERD. Alcohol use. Tobacco abuse.   History of Present Illness    Robert Church is a a longtime patient of cardiology who was was first evaluated by Dr. Johnsie Cancel on 07/23/2011 for substernal chest pain and dyspnea at the request of Dr. Elana Alm.  He underwent a nuclear stress test which was abnormal showing an apical infarct with small area of peri-infarct ischemia however, LV function was normal and apex contracted normally suggesting that it was not an infarct but possibly apical thinning that would explain the defect.  Echo showed EF 50 to 55%, mild LAE.  He was not evaluated again until 05/15/2014 with complaints of chest pain.  He had complaints of lower extremity edema since starting amlodipine.Marland Kitchen  He testing was done at that time.  He was seen again on 04/29/2016 with continued complaints of chest pain.  Unable to walk on treadmill secondary to bilateral knee pain and requested not to repeat Lexiscan secondary to it making him feel terrible.  CTA showed calcium score 102 (68th percentile), mixed plaque in proximal and mid LAD (50%).  Extracardiac findings included 4 mm LLL lung nodule and pulmonary artery enlargement suggestive of pulmonary artery hypertension.  He returned April 2018 with complaints of persistent symptoms unchanged from previous.  LHC showed normal coronary arteries, global hypokinesis, EF 40%, LVEDP 18 mmHg.  Recommendations for GDMT heart failure therapy and sleep study to rule out OSA.  He is followed by Dr. Haroldine Laws in  the heart  failure clinic.  Followed by A-fib clinic.  He has continued to be followed further history and testing outlined above.  Seen in the office by Dr. Haroldine Laws on 01/13/2023 with complaints of increased shortness of breath and low energy for the past 2 months.  Echo performed showing EF <20% (detailed above).  Diuretics were titrated and labs drawn with plan for follow-up the following week.  Patient was then seen by Dr. Haroldine Laws on 01/18/2023 with weight down and edema resolved.  Patient with continued complaints of feeling very fatigued with exertion.  Concern for low output so RHC was scheduled to further determine if advanced therapies need to be considered.  Patient is not a transplant candidate secondary to COPD/continued tobacco use but he is willing to consider LVAD if necessary.  Fortunately, patient's catheterization was delayed secondary to positive COVID test.  Today, patient is recovering from Covid from February. Reports continued cough and chest congestion. He reports shortness of breath and DOE that has been unchanged since November 2023 when he had ICD upgrade. He denies lower extremity edema. He has been taking only 1 tablet of torsemide as he states he did not know he was supposed to be taking 2.  His weight has been stable.  However, he does not take diuretic when he is leaving the house so he did not take it for 2 days in a row secondary to "needing to get things done."  Yesterday his weight was up 5 pounds so he took 2 torsemide and weight is back to baseline today.  He is awaiting heart catheterization which is scheduled for 03/09/2023.  He continues to smoke but is down to 1 and half cigarettes a day.  He feels overall he is at his current baseline and has no concerns today.   ROS: All other systems reviewed and are otherwise negative except as noted in History of Present Illness.  Studies Reviewed    ECG personally reviewed by me today: A sensed V paced rhythm, rate 89 bpm.  No  significant changes from 01/13/2023.   Physical Exam    VS:  BP 116/70   Pulse 92   Ht '5\' 10"'$  (1.778 m)   Wt 232 lb 12.8 oz (105.6 kg)   SpO2 92%   BMI 33.40 kg/m  , BMI Body mass index is 33.4 kg/m.  GEN: Well nourished, well developed, in no acute distress. Neck: No JVD or carotid bruits. Cardiac: RRR. No murmurs. No rubs or gallops.   Respiratory:  Respirations regular and unlabored.  Breath sounds diminished throughout, rhonchi on the left that clears with cough. GI: Soft, nontender, nondistended. Extremities: Radials/DP/PT 2+ and equal bilaterally. No clubbing or cyanosis. No edema.  Skin: Warm and dry, no rash. Neuro: Strength intact.  Assessment & Plan   Chronic combined systolic and diastolic heart failure/nonischemic cardiomyopathy.  Followed by Dr. Haroldine Laws for extensive history. Barostim device implanted February 2023 and BiV upgrade November 2023.  Echo January 2024 showed EF <20%.  RHC delayed secondary to COVID-positive.  Now scheduled for 03/09/2023.  Patient denies lower extremity edema and reports stable weight.  He has been taking 1 tablet of torsemide a day as he did not realize he was supposed to be taking 2.  His weight has been stable so he will continue taking just the 1.  He does not take diuretics when he leaves the house.  He did not take them for 2 days causing his weight to be up 5 pounds yesterday so he  doubled his torsemide and is now back to baseline weight.  Diminished breath sounds throughout, rhonchi heard on the left that clears with cough.  He is otherwise euvolemic and well compensated on exam.  Continue torsemide, metolazone, losartan.  GDMT limited by symptoms of dizziness and low BP on Entresto, anaphylaxis on spironolactone, positional dizziness and nausea on Jardiance, fatigue on bisoprolol, dizziness on Coreg, and dry mouth on Toprol.  He is followed by advanced heart failure clinic.  Will get labs today in preparation for upcoming heart  catheterization. Nonobstructive CAD.  LHC September 2022 showed normal coronary arteries.  Patient denies chest pain, pressure, tightness.  His activity is severely limited secondary to respiratory status/heart failure.  He is intolerant to beta-blockers (see #1).  Currently taking Eliquis for left upper extremity DVT. Symptomatic bradycardia/third-degree AV block.  S/p PPM implanted April 2022 successfully upgraded to [facility] November 2023.  Last remote check February 2024 showed normal device function with stable leads and battery.  Upcoming follow-up with EP. PAF/a flutter.  Onset April 2022 reported by [employer] while wearing cardiac event monitor.  Diagnosis questionable secondary to difficult baseline with significant atrial ectopy, first-degree AV block.  Eliquis stopped 04/17/2021.  Not on beta-blocker (see #1). Left upper extremity DVT.  Diagnosis not definitive per upper extremity ultrasound secondary to significant edema being unable to abduct arm November 2023.  Recommended 14-monthcourse of Eliquis.  Patient denies spontaneous bleeding.  Continue Eliquis.  Disposition: CBC and BMP in preparation for upcoming heart catheterization.  Keep follow-up with EP 03/08/2023.  Return in 6 months with Dr. BGwenlyn Foundor sooner as needed.         Signed, DJustice Britain Enis Leatherwood, DNP, NP-C

## 2023-02-25 ENCOUNTER — Telehealth: Payer: Self-pay

## 2023-02-25 DIAGNOSIS — J9610 Chronic respiratory failure, unspecified whether with hypoxia or hypercapnia: Secondary | ICD-10-CM | POA: Diagnosis not present

## 2023-02-25 DIAGNOSIS — J449 Chronic obstructive pulmonary disease, unspecified: Secondary | ICD-10-CM | POA: Diagnosis not present

## 2023-02-25 NOTE — Telephone Encounter (Signed)
Pt  called to see if his pacemaker and barostem is MRI compatible? I told him I will have to ask Jonni Sanger about the barostem to see if it is MRI compatible. He would like for someone to give him a call back to let him know.

## 2023-02-25 NOTE — Telephone Encounter (Signed)
Does not appear device is MRI compatible.  Will have AT review this along with barostim.

## 2023-02-25 NOTE — Telephone Encounter (Signed)
Requested review of device for MRI compatibility from [facility] with [employer].   Per Aaron Edelman response was:  1.5t

## 2023-02-26 ENCOUNTER — Encounter: Payer: Self-pay | Admitting: Student

## 2023-02-26 ENCOUNTER — Ambulatory Visit: Payer: Medicare Other | Attending: Cardiovascular Disease | Admitting: Student

## 2023-02-26 VITALS — BP 116/70 | HR 92 | Ht 70.0 in | Wt 232.8 lb

## 2023-02-26 DIAGNOSIS — Z01812 Encounter for preprocedural laboratory examination: Secondary | ICD-10-CM | POA: Diagnosis not present

## 2023-02-26 DIAGNOSIS — Z950 Presence of cardiac pacemaker: Secondary | ICD-10-CM

## 2023-02-26 DIAGNOSIS — I2510 Atherosclerotic heart disease of native coronary artery without angina pectoris: Secondary | ICD-10-CM | POA: Diagnosis not present

## 2023-02-26 DIAGNOSIS — I428 Other cardiomyopathies: Secondary | ICD-10-CM

## 2023-02-26 DIAGNOSIS — I10 Essential (primary) hypertension: Secondary | ICD-10-CM

## 2023-02-26 DIAGNOSIS — I5042 Chronic combined systolic (congestive) and diastolic (congestive) heart failure: Secondary | ICD-10-CM

## 2023-02-26 DIAGNOSIS — I442 Atrioventricular block, complete: Secondary | ICD-10-CM

## 2023-02-26 NOTE — Patient Instructions (Signed)
Medication Instructions:  Your physician recommends that you continue on your current medications as directed. Please refer to the Current Medication list given to you today.  *If you need a refill on your cardiac medications before your next appointment, please call your pharmacy*   Lab Work: Your physician recommends that you have the following labs drawn today: BMET and CBC  If you have labs (blood work) drawn today and your tests are completely normal, you will receive your results only by: [place] (if you have MyChart) OR A paper copy in the mail If you have any lab test that is abnormal or we need to change your treatment, we will call you to review the results.   Testing/Procedures: NONE   Follow-Up: At [facility], you and your health needs are our priority.  As part of our continuing mission to provide you with exceptional heart care, we have created designated Provider Care Teams.  These Care Teams include your primary Cardiologist (physician) and Advanced Practice Providers (APPs -  Physician Assistants and Nurse Practitioners) who all work together to provide you with the care you need, when you need it.  We recommend signing up for the patient portal called "MyChart".  Sign up information is provided on this After Visit Summary.  MyChart is used to connect with patients for Virtual Visits (Telemedicine).  Patients are able to view lab/test results, encounter notes, upcoming appointments, etc.  Non-urgent messages can be sent to your provider as well.   To learn more about what you can do with MyChart, go to [email or web address].    Your next appointment:   6 month(s)  Provider:   Quay Burow, MD

## 2023-02-26 NOTE — Telephone Encounter (Signed)
LMOVM for patient to give the device clinic a call. 

## 2023-02-26 NOTE — Telephone Encounter (Signed)
I called the patient and let him know that his pacemaker and Barostim is MRI conditional.

## 2023-02-27 LAB — CBC
Hematocrit: 45 % (ref 37.5–51.0)
Hemoglobin: 15.1 g/dL (ref 13.0–17.7)
MCH: 31.6 pg (ref 26.6–33.0)
MCHC: 33.6 g/dL (ref 31.5–35.7)
MCV: 94 fL (ref 79–97)
Platelets: 216 10*3/uL (ref 150–450)
RBC: 4.78 x10E6/uL (ref 4.14–5.80)
RDW: 12.4 % (ref 11.6–15.4)
WBC: 8.4 10*3/uL (ref 3.4–10.8)

## 2023-02-27 LAB — BASIC METABOLIC PANEL
BUN/Creatinine Ratio: 21 (ref 10–24)
BUN: 21 mg/dL (ref 8–27)
CO2: 27 mmol/L (ref 20–29)
Calcium: 9.4 mg/dL (ref 8.6–10.2)
Chloride: 97 mmol/L (ref 96–106)
Creatinine, Ser: 1.02 mg/dL (ref 0.76–1.27)
Glucose: 131 mg/dL — ABNORMAL HIGH (ref 70–99)
Potassium: 4.5 mmol/L (ref 3.5–5.2)
Sodium: 140 mmol/L (ref 134–144)
eGFR: 82 mL/min/{1.73_m2} (ref >59)

## 2023-03-03 NOTE — Telephone Encounter (Signed)
The patient called stating that [facility] said that his ppm is not MRI compatible.

## 2023-03-05 ENCOUNTER — Encounter: Payer: Self-pay | Admitting: Cardiology

## 2023-03-05 ENCOUNTER — Ambulatory Visit: Payer: Medicare Other | Attending: Cardiology | Admitting: Cardiology

## 2023-03-05 VITALS — BP 120/78 | HR 88 | Ht 70.0 in | Wt 233.0 lb

## 2023-03-05 DIAGNOSIS — I5042 Chronic combined systolic (congestive) and diastolic (congestive) heart failure: Secondary | ICD-10-CM

## 2023-03-05 DIAGNOSIS — I442 Atrioventricular block, complete: Secondary | ICD-10-CM

## 2023-03-05 LAB — CUP PACEART INCLINIC DEVICE CHECK
Battery Remaining Longevity: 82 mo
Battery Voltage: 2.99 V
Brady Statistic RA Percent Paced: 0.49 %
Brady Statistic RV Percent Paced: 98 %
Date Time Interrogation Session: 20240315143310
Implantable Lead Connection Status: 753985
Implantable Lead Connection Status: 753985
Implantable Lead Connection Status: 753985
Implantable Lead Implant Date: 20220427
Implantable Lead Implant Date: 20220427
Implantable Lead Implant Date: 20231122
Implantable Lead Location: 753858
Implantable Lead Location: 753859
Implantable Lead Location: 753860
Implantable Pulse Generator Implant Date: 20231122
Lead Channel Impedance Value: 487.5 Ohm
Lead Channel Impedance Value: 487.5 Ohm
Lead Channel Impedance Value: 637.5 Ohm
Lead Channel Pacing Threshold Amplitude: 0.5 V
Lead Channel Pacing Threshold Amplitude: 0.5 V
Lead Channel Pacing Threshold Amplitude: 0.75 V
Lead Channel Pacing Threshold Amplitude: 0.75 V
Lead Channel Pacing Threshold Amplitude: 1 V
Lead Channel Pacing Threshold Amplitude: 1 V
Lead Channel Pacing Threshold Pulse Width: 0.5 ms
Lead Channel Pacing Threshold Pulse Width: 0.5 ms
Lead Channel Pacing Threshold Pulse Width: 0.5 ms
Lead Channel Pacing Threshold Pulse Width: 0.5 ms
Lead Channel Pacing Threshold Pulse Width: 0.5 ms
Lead Channel Pacing Threshold Pulse Width: 0.5 ms
Lead Channel Sensing Intrinsic Amplitude: 11.2 mV
Lead Channel Sensing Intrinsic Amplitude: 2.1 mV
Lead Channel Setting Pacing Amplitude: 1.75 V
Lead Channel Setting Pacing Amplitude: 2 V
Lead Channel Setting Pacing Amplitude: 2 V
Lead Channel Setting Pacing Pulse Width: 0.5 ms
Lead Channel Setting Pacing Pulse Width: 0.5 ms
Lead Channel Setting Sensing Sensitivity: 2 mV
Pulse Gen Model: 3222
Pulse Gen Serial Number: 3979797

## 2023-03-05 NOTE — H&P (View-Only) (Signed)
Electrophysiology Office Note   Date:  03/05/2023   ID:  Robert Church, DOB 07-Nov-1956, MRN [id number]  PCP:  Janith Lima, MD  Cardiologist:  [facility] Primary Electrophysiologist:  Yvana Samonte Meredith Leeds, MD    Chief Complaint: pacemaker   History of Present Illness: Robert Church is a 67 y.o. male who is being seen today for the evaluation of pacemaker at the request of Janith Lima, MD. Presenting today for electrophysiology evaluation.  Has a history of chronic systolic heart failure, COPD, obstructive sleep apnea, prior tobacco abuse, obesity, hypertension, hyperlipidemia, heart failure.  He presented to the hospital April 2022 with complete heart block and is post [facility] dual-chamber pacemaker implanted 04/16/2021.  He was paced 100% of the time and is now status post upgrade to a left bundle lead on 11/13/2022.  Today, denies symptoms of palpitations, chest pain, orthopnea, PND, lower extremity edema, claudication, dizziness, presyncope, syncope, bleeding, or neurologic sequela. The patient is tolerating medications without difficulties.  His main complaint is weakness, fatigue, shortness of breath.  He is currently undergoing workup by heart failure cardiology.  He has plans for upcoming catheterization.   Past Medical History:  Diagnosis Date   Adenomatous colon polyp    Allergy    Anal fissure    Arthritis    Asthma    Bronchitis    CHF (congestive heart failure) (HCC)    COPD, group D, by GOLD 2017 classification ([place])    Dyspnea    Emphysema lung (HCC)    GERD (gastroesophageal reflux disease)    History of hiatal hernia    Hyperlipidemia    Hypertension    NICM (nonischemic cardiomyopathy) (HCC)    OSA (obstructive sleep apnea)    Pneumonia    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   Requires supplemental oxygen    Sinusitis    SOB (shortness of breath)    Past Surgical History:  Procedure Laterality Date   BIV UPGRADE N/A 10/07/2021    Procedure: BIV PPM UPGRADE;  Surgeon: Constance Haw, MD;  Location: [place] [facility];  Service: Cardiovascular;  Laterality: N/A;   BIV UPGRADE N/A 11/11/2022   Procedure: BIV PPM UPGRADE;  Surgeon: Constance Haw, MD;  Location: [place] [facility];  Service: Cardiovascular;  Laterality: N/A;   LEFT HEART CATH AND CORONARY ANGIOGRAPHY N/A 04/19/2017   Procedure: Left Heart Cath and Coronary Angiography;  Surgeon: Belva Crome, MD;  Location: [place] [facility];  Service: Cardiovascular;  Laterality: N/A;   PACEMAKER IMPLANT N/A 04/16/2021   Procedure: PACEMAKER IMPLANT;  Surgeon: Constance Haw, MD;  Location: [place] [facility];  Service: Cardiovascular;  Laterality: N/A;   RIGHT/LEFT HEART CATH AND CORONARY ANGIOGRAPHY N/A 08/22/2021   Procedure: RIGHT/LEFT HEART CATH AND CORONARY ANGIOGRAPHY;  Surgeon: Jolaine Artist, MD;  Location: [place] [facility];  Service: Cardiovascular;  Laterality: N/A;   TONSILLECTOMY       Current Outpatient Medications  Medication Sig Dispense Refill   albuterol (PROVENTIL) (2.5 MG/3ML) 0.083% nebulizer solution Take 3 mLs (2.5 mg total) by nebulization every 4 (four) hours as needed for wheezing or shortness of breath. 75 mL 11   albuterol (VENTOLIN HFA) 108 (90 Base) MCG/ACT inhaler INHALE 2 PUFFS INTO THE LUNGS EVERY 6 HOURS AS NEEDED FOR WHEEZING 18 g 4   allopurinol (ZYLOPRIM) 300 MG tablet Take 1 tablet (300 mg total) by mouth daily. (Patient taking differently: Take  300 mg by mouth daily as needed (gout flare).) 90 tablet 0   ALPRAZolam (XANAX) 0.5 MG tablet TAKE 1 TABLET(0.5 MG) BY MOUTH THREE TIMES DAILY AS NEEDED FOR ANXIETY (Patient taking differently: Take 0.5 mg by mouth 3 (three) times daily.) 270 tablet 0   AMBULATORY NON FORMULARY MEDICATION Medication Name: Nitroglycerin gel 0.125% apply 2-3 times daily to the anal canal. (Patient taking differently: Place 1 Application rectally daily as needed (Fissure). Medication Name:  Nitroglycerin gel 0.125% apply 2-3 times daily to the anal canal.) 30 g 1   apixaban (ELIQUIS) 5 MG TABS tablet Take 1 tablet (5 mg total) by mouth 2 (two) times daily. 60 tablet 11   Aspirin-Caffeine (BC FAST PAIN RELIEF PO) Take 1 packet by mouth daily.     cholecalciferol (VITAMIN D3) 25 MCG (1000 UNIT) tablet Take 1,000 Units by mouth daily.     colchicine 0.6 MG tablet Take 0.6 mg by mouth daily as needed (gout pain).     diclofenac Sodium (VOLTAREN) 1 % GEL Apply 1 Application topically daily at 12 noon.     EPINEPHrine 0.3 mg/0.3 mL IJ SOAJ injection Inject 0.3 mg into the muscle as needed for anaphylaxis. 1 each 0   fluticasone (FLONASE) 50 MCG/ACT nasal spray SHAKE LIQUID AND USE 2 SPRAYS IN EACH NOSTRIL DAILY (Patient taking differently: Place 1 spray into both nostrils daily as needed for allergies.) 16 g 2   guaiFENesin (MUCINEX) 600 MG 12 hr tablet Take 1 tablet (600 mg total) by mouth 2 (two) times daily. (Patient taking differently: Take 600 mg by mouth 2 (two) times daily as needed for cough or to loosen phlegm.) 60 tablet 5   ipratropium (ATROVENT) 0.06 % nasal spray Place 2 sprays into both nostrils 4 (four) times daily. (Patient taking differently: Place 2 sprays into both nostrils 4 (four) times daily as needed for rhinitis.) 15 mL 3   lidocaine (XYLOCAINE) 5 % ointment Apply topically 3 (three) times daily as needed. (Patient taking differently: Apply 1 Application topically daily as needed (Fissure).) 35.44 g 1   Lidocaine, Anorectal, 5 % CREA Apply 1 application topically 2 (two) times daily. (Patient taking differently: Apply 1 application  topically daily as needed (Fissure).) 30 g 1   loratadine (CLARITIN) 10 MG tablet Take 10 mg by mouth daily.     metolazone (ZAROXOLYN) 2.5 MG tablet Take 1 tablet (2.5 mg total) by mouth as directed. (Patient taking differently: Take 2.5 mg by mouth daily as needed (swelling).) 5 tablet 1   mometasone-formoterol (DULERA) 100-5 MCG/ACT AERO  Inhale 2 puffs into the lungs 2 (two) times daily. 13 g 5   montelukast (SINGULAIR) 10 MG tablet TAKE 1 TABLET BY MOUTH EVERYDAY AT BEDTIME 30 tablet 2   nicotine (NICODERM CQ - DOSED IN MG/24 HOURS) 14 mg/24hr patch Place 1 patch (14 mg total) onto the skin daily. 28 patch 0   omeprazole (PRILOSEC) 20 MG capsule TAKE 1 CAPSULE(20 MG) BY MOUTH TWICE DAILY BEFORE A MEAL FOR ACID REFLUX 180 capsule 0   potassium chloride SA (KLOR-CON M20) 20 MEQ tablet Take 1 tablet (20 mEq total) by mouth daily. Take an extra 40 meq when you take metolazone 90 tablet 3   predniSONE (DELTASONE) 10 MG tablet TAKE 1 TABLET BY MOUTH EVERY DAY WITH BREAKFAST 30 tablet 3   roflumilast (DALIRESP) 500 MCG TABS tablet Take 1 tablet (500 mcg total) by mouth daily. (Patient taking differently: Take 250 mcg by mouth 2 (two) times daily.)  30 tablet 2   Testosterone 20.25 MG/1.25GM (1.62%) GEL Place 1.25 g onto the skin daily. (Patient taking differently: Place 1 Pump onto the skin every other day.) 112.5 g 0   Tiotropium Bromide Monohydrate (SPIRIVA RESPIMAT) 2.5 MCG/ACT AERS Inhale 2 puffs into the lungs daily. (Patient taking differently: Inhale 1 puff into the lungs 2 (two) times daily.) 4 g 4   torsemide (DEMADEX) 20 MG tablet Take 2 tablets (40 mg total) by mouth daily. 60 tablet 11   vitamin B-12 (CYANOCOBALAMIN) 1000 MCG tablet Take 1,000 mcg by mouth daily.     losartan (COZAAR) 25 MG tablet Take 1 tablet (25 mg total) by mouth daily. (Patient not taking: Reported on 03/05/2023) 90 tablet 3   No current facility-administered medications for this visit.    Allergies:   Bee venom, Spironolactone, Daliresp [roflumilast], Jardiance [empagliflozin], Penicillins, Clindamycin, Doxycycline, E-mycin [erythromycin base], and Rosuvastatin   Social History:  The patient  reports that he has been smoking cigarettes. He has a 92.00 pack-year smoking history. He has been exposed to tobacco smoke. He has never used smokeless tobacco. He  reports that he does not currently use alcohol after a past usage of about 28.0 standard drinks of alcohol per week. He reports that he does not use drugs.   Family History:  The patient's family history includes Diabetes in his maternal grandmother; High blood pressure in his mother; Lung cancer in his father.    ROS:  Please see the history of present illness.   Otherwise, review of systems is positive for none.   All other systems are reviewed and negative.   PHYSICAL EXAM: VS:  BP 120/78   Pulse 88   Ht 5\' 10"  (1.778 m)   Wt 233 lb (105.7 kg)   SpO2 96%   BMI 33.43 kg/m  , BMI Body mass index is 33.43 kg/m. GEN: Well nourished, well developed, in no acute distress  HEENT: normal  Neck: no JVD, carotid bruits, or masses Cardiac: RRR; no murmurs, rubs, or gallops,no edema  Respiratory:  clear to auscultation bilaterally, normal work of breathing GI: soft, nontender, nondistended, + BS MS: no deformity or atrophy  Skin: warm and dry, device site well healed Neuro:  Strength and sensation are intact Psych: euthymic mood, full affect  EKG:  EKG is ordered today. Personal review of the ekg ordered shows sinus rhythm, V paced  Personal review of the device interrogation today. Results in [place]: 09/14/2022: Pro B Natriuretic peptide (BNP) 167.0 01/13/2023: ALT 25 01/18/2023: B Natriuretic Peptide 161.1 02/26/2023: BUN 21; Creatinine, Ser 1.02; Hemoglobin 15.1; Platelets 216; Potassium 4.5; Sodium 140    Lipid Panel     Component Value Date/Time   CHOL 222 (H) 09/15/2022 1330   TRIG 257.0 (H) 09/15/2022 1330   HDL 51.90 09/15/2022 1330   CHOLHDL 4 09/15/2022 1330   VLDL 51.4 (H) 09/15/2022 1330   LDLCALC 117 (H) 10/20/2021 1406   LDLCALC 108 (H) 07/10/2020 1035   LDLDIRECT 157.0 09/15/2022 1330     Wt Readings from Last 3 Encounters:  03/05/23 233 lb (105.7 kg)  02/26/23 232 lb 12.8 oz (105.6 kg)  02/16/23 238 lb (108 kg)      Other studies  Reviewed: Additional studies/ records that were reviewed today include: RHC/LHC 08/22/21  Review of the above records today demonstrates:  Normal coronary arteries Mildly elevated filling pressures with normal output NICM EF 30-35%  TTE 07/31/21  1. Diffuse hypokinesis worse  in inferior base with abnormal septal motion  EF similar to TTE done 04/16/21 . Left ventricular ejection fraction, by  estimation, is 30 to 35%. The left ventricle has moderately decreased  function. The left ventricle  demonstrates global hypokinesis. There is mild left ventricular  hypertrophy. Left ventricular diastolic parameters are indeterminate.   2. Right ventricular systolic function is moderately reduced. The right  ventricular size is mildly enlarged. There is normal pulmonary artery  systolic pressure.   3. The mitral valve is abnormal. Mild mitral valve regurgitation. No  evidence of mitral stenosis.   4. The aortic valve is tricuspid. Aortic valve regurgitation is not  visualized. Mild aortic valve sclerosis is present, with no evidence of  aortic valve stenosis.   5. Aortic dilatation noted. There is mild dilatation of the ascending  aorta, measuring 40 mm.   6. The inferior vena cava is dilated in size with <50% respiratory  variability, suggesting right atrial pressure of 15 mmHg.    ASSESSMENT AND PLAN:  1.  Chronic systolic heart failure: Due to nonischemic cardiomyopathy.  Currently on optimal medical therapy per primary cardiology.  Is status post [facility] pacemaker with upgrade with a left bundle area pacing lead.  Threshold, sensing, impedance within normal limits.  Dock Baccam continue with current management.  2.  Complete heart block: Status post [facility] pacemaker.  Device functioning appropriately.  Sensing threshold, impedance within normal limits.  No changes.  3.  Hypertension: Currently well-controlled  4.  Obstructive sleep apnea: BiPAP compliance encouraged  5.  DVT: Occurred as a  complication of pacemaker implant.  Currently on Eliquis.  It has been 3 months since his DVT occurred.  Brylinn Teaney stop Eliquis today.   Current medicines are reviewed at length with the patient today.   The patient does not have concerns regarding his medicines.  The following changes were made today: Stop Eliquis  Labs/ tests ordered today include:  Orders Placed This Encounter  Procedures   CUP [place]   EKG 12-Lead      Disposition:   FU 12 months  Signed, Anam Bobby Meredith Leeds, MD  03/05/2023 2:42 PM     [place] [address] [place] [address] [phone] (office) [phone] (fax)

## 2023-03-05 NOTE — Progress Notes (Signed)
Electrophysiology Office Note   Date:  03/05/2023   ID:  Robert Church, DOB [birth year], MRN [id number]  PCP:  Janith Lima, MD  Cardiologist:  [place] Primary Electrophysiologist:  [place]ina Meredith Leeds, MD    Chief Complaint: pacemaker   History of Present Illness: Robert Church is a 67 y.o. male who is being seen today for the evaluation of pacemaker at the request of Janith Lima, MD. Presenting today for electrophysiology evaluation.  Has a history of chronic systolic heart failure, COPD, obstructive sleep apnea, prior tobacco abuse, obesity, hypertension, hyperlipidemia, heart failure.  He presented to the hospital April 2022 with complete heart block and is post [facility] dual-chamber pacemaker implanted 04/16/2021.  He was paced 100% of the time and is now status post upgrade to a left bundle lead on 11/13/2022.  Today, denies symptoms of palpitations, chest pain, orthopnea, PND, lower extremity edema, claudication, dizziness, presyncope, syncope, bleeding, or neurologic sequela. The patient is tolerating medications without difficulties.  His main complaint is weakness, fatigue, shortness of breath.  He is currently undergoing workup by heart failure cardiology.  He has plans for upcoming catheterization.   Past Medical History:  Diagnosis Date   Adenomatous colon polyp    Allergy    Anal fissure    Arthritis    Asthma    Bronchitis    CHF (congestive heart failure) (HCC)    COPD, group D, by GOLD 2017 classification ([place])    Dyspnea    Emphysema lung (HCC)    GERD (gastroesophageal reflux disease)    History of hiatal hernia    Hyperlipidemia    Hypertension    NICM (nonischemic cardiomyopathy) (HCC)    OSA (obstructive sleep apnea)    Pneumonia    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   Requires supplemental oxygen    Sinusitis    SOB (shortness of breath)    Past Surgical History:  Procedure Laterality Date   BIV UPGRADE N/A 10/07/2021    Procedure: BIV PPM UPGRADE;  Surgeon: Constance Haw, MD;  Location: [place] [facility];  Service: Cardiovascular;  Laterality: N/A;   BIV UPGRADE N/A 11/11/2022   Procedure: BIV PPM UPGRADE;  Surgeon: Constance Haw, MD;  Location: [place] [facility];  Service: Cardiovascular;  Laterality: N/A;   LEFT HEART CATH AND CORONARY ANGIOGRAPHY N/A 04/19/2017   Procedure: Left Heart Cath and Coronary Angiography;  Surgeon: Belva Crome, MD;  Location: [place] [facility];  Service: Cardiovascular;  Laterality: N/A;   PACEMAKER IMPLANT N/A 04/16/2021   Procedure: PACEMAKER IMPLANT;  Surgeon: Constance Haw, MD;  Location: [place] [facility];  Service: Cardiovascular;  Laterality: N/A;   RIGHT/LEFT HEART CATH AND CORONARY ANGIOGRAPHY N/A 08/22/2021   Procedure: RIGHT/LEFT HEART CATH AND CORONARY ANGIOGRAPHY;  Surgeon: Jolaine Artist, MD;  Location: [place] [facility];  Service: Cardiovascular;  Laterality: N/A;   TONSILLECTOMY       Current Outpatient Medications  Medication Sig Dispense Refill   albuterol (PROVENTIL) (2.5 MG/3ML) 0.083% nebulizer solution Take 3 mLs (2.5 mg total) by nebulization every 4 (four) hours as needed for wheezing or shortness of breath. 75 mL 11   albuterol (VENTOLIN HFA) 108 (90 Base) MCG/ACT inhaler INHALE 2 PUFFS INTO THE LUNGS EVERY 6 HOURS AS NEEDED FOR WHEEZING 18 g 4   allopurinol (ZYLOPRIM) 300 MG tablet Take 1 tablet (300 mg total) by mouth daily. (Patient taking differently: Take  300 mg by mouth daily as needed (gout flare).) 90 tablet 0   ALPRAZolam (XANAX) 0.5 MG tablet TAKE 1 TABLET(0.5 MG) BY MOUTH THREE TIMES DAILY AS NEEDED FOR ANXIETY (Patient taking differently: Take 0.5 mg by mouth 3 (three) times daily.) 270 tablet 0   AMBULATORY NON FORMULARY MEDICATION Medication Name: Nitroglycerin gel 0.125% apply 2-3 times daily to the anal canal. (Patient taking differently: Place 1 Application rectally daily as needed (Fissure). Medication Name:  Nitroglycerin gel 0.125% apply 2-3 times daily to the anal canal.) 30 g 1   apixaban (ELIQUIS) 5 MG TABS tablet Take 1 tablet (5 mg total) by mouth 2 (two) times daily. 60 tablet 11   Aspirin-Caffeine (BC FAST PAIN RELIEF PO) Take 1 packet by mouth daily.     cholecalciferol (VITAMIN D3) 25 MCG (1000 UNIT) tablet Take 1,000 Units by mouth daily.     colchicine 0.6 MG tablet Take 0.6 mg by mouth daily as needed (gout pain).     diclofenac Sodium (VOLTAREN) 1 % GEL Apply 1 Application topically daily at 12 noon.     EPINEPHrine 0.3 mg/0.3 mL IJ SOAJ injection Inject 0.3 mg into the muscle as needed for anaphylaxis. 1 each 0   fluticasone (FLONASE) 50 MCG/ACT nasal spray SHAKE LIQUID AND USE 2 SPRAYS IN EACH NOSTRIL DAILY (Patient taking differently: Place 1 spray into both nostrils daily as needed for allergies.) 16 g 2   guaiFENesin (MUCINEX) 600 MG 12 hr tablet Take 1 tablet (600 mg total) by mouth 2 (two) times daily. (Patient taking differently: Take 600 mg by mouth 2 (two) times daily as needed for cough or to loosen phlegm.) 60 tablet 5   ipratropium (ATROVENT) 0.06 % nasal spray Place 2 sprays into both nostrils 4 (four) times daily. (Patient taking differently: Place 2 sprays into both nostrils 4 (four) times daily as needed for rhinitis.) 15 mL 3   lidocaine (XYLOCAINE) 5 % ointment Apply topically 3 (three) times daily as needed. (Patient taking differently: Apply 1 Application topically daily as needed (Fissure).) 35.44 g 1   Lidocaine, Anorectal, 5 % CREA Apply 1 application topically 2 (two) times daily. (Patient taking differently: Apply 1 application  topically daily as needed (Fissure).) 30 g 1   loratadine (CLARITIN) 10 MG tablet Take 10 mg by mouth daily.     metolazone (ZAROXOLYN) 2.5 MG tablet Take 1 tablet (2.5 mg total) by mouth as directed. (Patient taking differently: Take 2.5 mg by mouth daily as needed (swelling).) 5 tablet 1   mometasone-formoterol (DULERA) 100-5 MCG/ACT AERO  Inhale 2 puffs into the lungs 2 (two) times daily. 13 g 5   montelukast (SINGULAIR) 10 MG tablet TAKE 1 TABLET BY MOUTH EVERYDAY AT BEDTIME 30 tablet 2   nicotine (NICODERM CQ - DOSED IN MG/24 HOURS) 14 mg/24hr patch Place 1 patch (14 mg total) onto the skin daily. 28 patch 0   omeprazole (PRILOSEC) 20 MG capsule TAKE 1 CAPSULE(20 MG) BY MOUTH TWICE DAILY BEFORE A MEAL FOR ACID REFLUX 180 capsule 0   potassium chloride SA (KLOR-CON M20) 20 MEQ tablet Take 1 tablet (20 mEq total) by mouth daily. Take an extra 40 meq when you take metolazone 90 tablet 3   predniSONE (DELTASONE) 10 MG tablet TAKE 1 TABLET BY MOUTH EVERY DAY WITH BREAKFAST 30 tablet 3   roflumilast (DALIRESP) 500 MCG TABS tablet Take 1 tablet (500 mcg total) by mouth daily. (Patient taking differently: Take 250 mcg by mouth 2 (two) times daily.)  30 tablet 2   Testosterone 20.25 MG/1.25GM (1.62%) GEL Place 1.25 g onto the skin daily. (Patient taking differently: Place 1 Pump onto the skin every other day.) 112.5 g 0   Tiotropium Bromide Monohydrate (SPIRIVA RESPIMAT) 2.5 MCG/ACT AERS Inhale 2 puffs into the lungs daily. (Patient taking differently: Inhale 1 puff into the lungs 2 (two) times daily.) 4 g 4   torsemide (DEMADEX) 20 MG tablet Take 2 tablets (40 mg total) by mouth daily. 60 tablet 11   vitamin B-12 (CYANOCOBALAMIN) 1000 MCG tablet Take 1,000 mcg by mouth daily.     losartan (COZAAR) 25 MG tablet Take 1 tablet (25 mg total) by mouth daily. (Patient not taking: Reported on 03/05/2023) 90 tablet 3   No current facility-administered medications for this visit.    Allergies:   Bee venom, Spironolactone, Daliresp [roflumilast], Jardiance [empagliflozin], Penicillins, Clindamycin, Doxycycline, E-mycin [erythromycin base], and Rosuvastatin   Social History:  The patient  reports that he has been smoking cigarettes. He has a 92.00 pack-year smoking history. He has been exposed to tobacco smoke. He has never used smokeless tobacco. He  reports that he does not currently use alcohol after a past usage of about 28.0 standard drinks of alcohol per week. He reports that he does not use drugs.   Family History:  The patient's family history includes Diabetes in his maternal grandmother; High blood pressure in his mother; Lung cancer in his father.    ROS:  Please see the history of present illness.   Otherwise, review of systems is positive for none.   All other systems are reviewed and negative.   PHYSICAL EXAM: VS:  BP 120/78   Pulse 88   Ht 5\' 10"  (1.778 m)   Wt 233 lb (105.7 kg)   SpO2 96%   BMI 33.43 kg/m  , BMI Body mass index is 33.43 kg/m. GEN: Well nourished, well developed, in no acute distress  HEENT: normal  Neck: no JVD, carotid bruits, or masses Cardiac: RRR; no murmurs, rubs, or gallops,no edema  Respiratory:  clear to auscultation bilaterally, normal work of breathing GI: soft, nontender, nondistended, + BS MS: no deformity or atrophy  Skin: warm and dry, device site well healed Neuro:  Strength and sensation are intact Psych: euthymic mood, full affect  EKG:  EKG is ordered today. Personal review of the ekg ordered shows sinus rhythm, V paced  Personal review of the device interrogation today. Results in [place]: 09/14/2022: Pro B Natriuretic peptide (BNP) 167.0 01/13/2023: ALT 25 01/18/2023: B Natriuretic Peptide 161.1 02/26/2023: BUN 21; Creatinine, Ser 1.02; Hemoglobin 15.1; Platelets 216; Potassium 4.5; Sodium 140    Lipid Panel     Component Value Date/Time   CHOL 222 (H) 09/15/2022 1330   TRIG 257.0 (H) 09/15/2022 1330   HDL 51.90 09/15/2022 1330   CHOLHDL 4 09/15/2022 1330   VLDL 51.4 (H) 09/15/2022 1330   LDLCALC 117 (H) 10/20/2021 1406   LDLCALC 108 (H) 07/10/2020 1035   LDLDIRECT 157.0 09/15/2022 1330     Wt Readings from Last 3 Encounters:  03/05/23 233 lb (105.7 kg)  02/26/23 232 lb 12.8 oz (105.6 kg)  02/16/23 238 lb (108 kg)      Other studies  Reviewed: Additional studies/ records that were reviewed today include: RHC/LHC 08/22/21  Review of the above records today demonstrates:  Normal coronary arteries Mildly elevated filling pressures with normal output NICM EF 30-35%  TTE 07/31/21  1. Diffuse hypokinesis worse  in inferior base with abnormal septal motion  EF similar to TTE done 04/16/21 . Left ventricular ejection fraction, by  estimation, is 30 to 35%. The left ventricle has moderately decreased  function. The left ventricle  demonstrates global hypokinesis. There is mild left ventricular  hypertrophy. Left ventricular diastolic parameters are indeterminate.   2. Right ventricular systolic function is moderately reduced. The right  ventricular size is mildly enlarged. There is normal pulmonary artery  systolic pressure.   3. The mitral valve is abnormal. Mild mitral valve regurgitation. No  evidence of mitral stenosis.   4. The aortic valve is tricuspid. Aortic valve regurgitation is not  visualized. Mild aortic valve sclerosis is present, with no evidence of  aortic valve stenosis.   5. Aortic dilatation noted. There is mild dilatation of the ascending  aorta, measuring 40 mm.   6. The inferior vena cava is dilated in size with <50% respiratory  variability, suggesting right atrial pressure of 15 mmHg.    ASSESSMENT AND PLAN:  1.  Chronic systolic heart failure: Due to nonischemic cardiomyopathy.  Currently on optimal medical therapy per primary cardiology.  Is status post [facility] pacemaker with upgrade with a left bundle area pacing lead.  Threshold, sensing, impedance within normal limits.  Syed Zukas continue with current management.  2.  Complete heart block: Status post [facility] pacemaker.  Device functioning appropriately.  Sensing threshold, impedance within normal limits.  No changes.  3.  Hypertension: Currently well-controlled  4.  Obstructive sleep apnea: BiPAP compliance encouraged  5.  DVT: Occurred as a  complication of pacemaker implant.  Currently on Eliquis.  It has been 3 months since his DVT occurred.  Elbert Polyakov stop Eliquis today.   Current medicines are reviewed at length with the patient today.   The patient does not have concerns regarding his medicines.  The following changes were made today: Stop Eliquis  Labs/ tests ordered today include:  Orders Placed This Encounter  Procedures   CUP [place]   EKG 12-Lead      Disposition:   FU 12 months  Signed, Telma Pyeatt Meredith Leeds, MD  03/05/2023 2:42 PM     [place] [address] [place] [address] [phone] (office) [phone] (fax)

## 2023-03-05 NOTE — Patient Instructions (Signed)
Medication Instructions:  Your physician has recommended you make the following change in your medication:  STOP Eliquis  *If you need a refill on your cardiac medications before your next appointment, please call your pharmacy*   Lab Work: None ordered   Testing/Procedures: None ordered   Follow-Up: At [facility], you and your health needs are our priority.  As part of our continuing mission to provide you with exceptional heart care, we have created designated Provider Care Teams.  These Care Teams include your primary Cardiologist (physician) and Advanced Practice Providers (APPs -  Physician Assistants and Nurse Practitioners) who all work together to provide you with the care you need, when you need it.  Remote monitoring is used to monitor your Pacemaker or ICD from home. This monitoring reduces the number of office visits required to check your device to one time per year. It allows [place] to keep an eye on the functioning of your device to ensure it is working properly. You are scheduled for a device check from home on 05/18/2023. You may send your transmission at any time that day. If you have a wireless device, the transmission will be sent automatically. After your physician reviews your transmission, you will receive a postcard with your next transmission date.  Your next appointment:   1 year(s)  The format for your next appointment:   In Person  Provider:   You will see one of the following Advanced Practice Providers on your designated Care Team:   Tommye Standard, Renaee Munda" [place], [place] Mamie Levers, NP     Thank you for choosing [facility]!!   Trinidad Curet, RN [phone]    Other Instructions

## 2023-03-08 ENCOUNTER — Ambulatory Visit: Payer: Medicare Other | Admitting: Student

## 2023-03-09 ENCOUNTER — Ambulatory Visit (HOSPITAL_COMMUNITY)
Admission: RE | Admit: 2023-03-09 | Discharge: 2023-03-09 | Disposition: A | Discharge status: Home / Self Care | Payer: Medicare Other | Attending: Internal Medicine | Admitting: Internal Medicine

## 2023-03-09 ENCOUNTER — Other Ambulatory Visit: Payer: Self-pay

## 2023-03-09 ENCOUNTER — Encounter (HOSPITAL_COMMUNITY)
Admission: RE | Disposition: A | Discharge status: Home / Self Care | Payer: Self-pay | Source: Home / Self Care | Attending: Internal Medicine

## 2023-03-09 DIAGNOSIS — E785 Hyperlipidemia, unspecified: Secondary | ICD-10-CM | POA: Diagnosis not present

## 2023-03-09 DIAGNOSIS — I509 Heart failure, unspecified: Secondary | ICD-10-CM | POA: Diagnosis not present

## 2023-03-09 DIAGNOSIS — Z6833 Body mass index (BMI) 33.0-33.9, adult: Secondary | ICD-10-CM | POA: Insufficient documentation

## 2023-03-09 DIAGNOSIS — Z950 Presence of cardiac pacemaker: Secondary | ICD-10-CM | POA: Diagnosis not present

## 2023-03-09 DIAGNOSIS — G4733 Obstructive sleep apnea (adult) (pediatric): Secondary | ICD-10-CM | POA: Insufficient documentation

## 2023-03-09 DIAGNOSIS — I442 Atrioventricular block, complete: Secondary | ICD-10-CM | POA: Insufficient documentation

## 2023-03-09 DIAGNOSIS — J449 Chronic obstructive pulmonary disease, unspecified: Secondary | ICD-10-CM | POA: Insufficient documentation

## 2023-03-09 DIAGNOSIS — E669 Obesity, unspecified: Secondary | ICD-10-CM | POA: Insufficient documentation

## 2023-03-09 DIAGNOSIS — Z7901 Long term (current) use of anticoagulants: Secondary | ICD-10-CM | POA: Insufficient documentation

## 2023-03-09 DIAGNOSIS — Z86718 Personal history of other venous thrombosis and embolism: Secondary | ICD-10-CM | POA: Diagnosis not present

## 2023-03-09 DIAGNOSIS — F17210 Nicotine dependence, cigarettes, uncomplicated: Secondary | ICD-10-CM | POA: Insufficient documentation

## 2023-03-09 DIAGNOSIS — I428 Other cardiomyopathies: Secondary | ICD-10-CM | POA: Insufficient documentation

## 2023-03-09 DIAGNOSIS — I5022 Chronic systolic (congestive) heart failure: Secondary | ICD-10-CM | POA: Insufficient documentation

## 2023-03-09 DIAGNOSIS — I110 Hypertensive heart disease with heart failure: Secondary | ICD-10-CM | POA: Insufficient documentation

## 2023-03-09 HISTORY — PX: RIGHT HEART CATH: CATH118263

## 2023-03-09 LAB — POCT I-STAT EG7
Acid-Base Excess: 4 mmol/L — ABNORMAL HIGH (ref 0.0–2.0)
Bicarbonate: 29.9 mmol/L — ABNORMAL HIGH (ref 20.0–28.0)
Calcium, Ion: 1.21 mmol/L (ref 1.15–1.40)
HCT: 43 % (ref 39.0–52.0)
Hemoglobin: 14.6 g/dL (ref 13.0–17.0)
O2 Saturation: 63 %
Potassium: 3.8 mmol/L (ref 3.5–5.1)
Sodium: 138 mmol/L (ref 135–145)
TCO2: 31 mmol/L (ref 22–32)
pCO2, Ven: 48.3 mmHg (ref 44–60)
pH, Ven: 7.399 (ref 7.25–7.43)
pO2, Ven: 33 mmHg (ref 32–45)

## 2023-03-09 SURGERY — RIGHT HEART CATH
Anesthesia: LOCAL

## 2023-03-09 MED ORDER — SODIUM CHLORIDE 0.9% FLUSH: 3.0000 mL | Freq: Two times a day (BID) | INTRAVENOUS | Status: DC

## 2023-03-09 MED ORDER — LIDOCAINE HCL (PF) 1 % IJ SOLN
INTRAMUSCULAR | Status: AC
  Filled 2023-03-09: qty 30

## 2023-03-09 MED ORDER — MIDAZOLAM HCL 2 MG/2ML IJ SOLN
INTRAMUSCULAR | Status: AC
  Filled 2023-03-09: qty 2

## 2023-03-09 MED ORDER — HEPARIN (PORCINE) IN NACL 1000-0.9 UT/500ML-% IV SOLN
INTRAVENOUS | Status: DC | PRN
  Administered 2023-03-09: 500 mL

## 2023-03-09 MED ORDER — SODIUM CHLORIDE 0.9 % IV SOLN: 250.0000 mL | INTRAVENOUS | Status: DC | PRN

## 2023-03-09 MED ORDER — SODIUM CHLORIDE 0.9% FLUSH: 3.0000 mL | INTRAVENOUS | Status: DC | PRN

## 2023-03-09 MED ORDER — FENTANYL CITRATE (PF) 100 MCG/2ML IJ SOLN
INTRAMUSCULAR | Status: AC
  Filled 2023-03-09: qty 2

## 2023-03-09 MED ORDER — LIDOCAINE HCL (PF) 1 % IJ SOLN
INTRAMUSCULAR | Status: DC | PRN
  Administered 2023-03-09: 2 mL

## 2023-03-09 MED ORDER — FENTANYL CITRATE (PF) 100 MCG/2ML IJ SOLN
INTRAMUSCULAR | Status: DC | PRN
  Administered 2023-03-09: 25 ug via INTRAVENOUS

## 2023-03-09 MED ORDER — SODIUM CHLORIDE 0.9 % IV SOLN: INTRAVENOUS | Status: DC

## 2023-03-09 MED ORDER — MIDAZOLAM HCL 2 MG/2ML IJ SOLN
INTRAMUSCULAR | Status: DC | PRN
  Administered 2023-03-09: 1 mg via INTRAVENOUS

## 2023-03-09 SURGICAL SUPPLY — 4 items
CATH BALLN WEDGE 5F 110CM (CATHETERS) IMPLANT
PACK CARDIAC CATHETERIZATION (CUSTOM PROCEDURE TRAY) ×1 IMPLANT
SHEATH GLIDE SLENDER 4/5FR (SHEATH) IMPLANT
TRANSDUCER W/STOPCOCK (MISCELLANEOUS) ×1 IMPLANT

## 2023-03-09 NOTE — Interval H&P Note (Signed)
History and Physical Interval Note:  03/09/2023 2:00 PM  Robert Church  has presented today for surgery, with the diagnosis of heart failure.  The various methods of treatment have been discussed with the patient and family. After consideration of risks, benefits and other options for treatment, the patient has consented to  Procedure(s): RIGHT HEART CATH (N/A) as a surgical intervention.  The patient's history has been reviewed, patient examined, no change in status, stable for surgery.  I have reviewed the patient's chart and labs.  Questions were answered to the patient's satisfaction.     [name]

## 2023-03-09 NOTE — Discharge Instructions (Signed)
Brachial Site Care   This sheet gives you information about how to care for yourself after your procedure. Your health care provider may also give you more specific instructions. If you have problems or questions, contact your health care provider. What can I expect after the procedure? After the procedure, it is common to have: Bruising and tenderness at the catheter insertion area. Follow these instructions at home:  Insertion site care Follow instructions from your health care provider about how to take care of your insertion site. Make sure you: Wash your hands with soap and water before you change your bandage (dressing). If soap and water are not available, use hand sanitizer. Remove your dressing as told by your health care provider. In 24 hours Check your insertion site every day for signs of infection. Check for: Redness, swelling, or pain. Pus or a bad smell. Warmth. You may shower 24-48 hours after the procedure. Do not apply powder or lotion to the site.  Activity For 24 hours after the procedure, or as directed by your health care provider: Do not push or pull heavy objects with the affected arm. Do not drive yourself home from the hospital or clinic. You may drive 24 hours after the procedure unless your health care provider tells you not to. Do not lift anything that is heavier than 10 lb (4.5 kg), or the limit that you are told, until your health care provider says that it is safe.  For 24 hours  

## 2023-03-10 ENCOUNTER — Encounter (HOSPITAL_COMMUNITY): Payer: Self-pay | Admitting: Internal Medicine

## 2023-03-11 ENCOUNTER — Encounter: Payer: Self-pay | Admitting: Physical Medicine and Rehabilitation

## 2023-03-11 ENCOUNTER — Ambulatory Visit: Payer: Medicare Other | Admitting: Physical Medicine and Rehabilitation

## 2023-03-11 DIAGNOSIS — M5442 Lumbago with sciatica, left side: Secondary | ICD-10-CM

## 2023-03-11 DIAGNOSIS — G8929 Other chronic pain: Secondary | ICD-10-CM | POA: Diagnosis not present

## 2023-03-11 DIAGNOSIS — M47816 Spondylosis without myelopathy or radiculopathy, lumbar region: Secondary | ICD-10-CM | POA: Diagnosis not present

## 2023-03-11 DIAGNOSIS — M5416 Radiculopathy, lumbar region: Secondary | ICD-10-CM

## 2023-03-11 NOTE — Progress Notes (Signed)
Functional Pain Scale - descriptive words and definitions  Uncomfortable (3)  Pain is present but can complete all ADL's/sleep is slightly affected and passive distraction only gives marginal relief. Mild range order  Average Pain  varies  Lower back pain in the middle that radiates in the left leg

## 2023-03-11 NOTE — Progress Notes (Signed)
Robert Church - 67 y.o. male MRN [id number]  Date of birth: 1956/08/13  Office Visit Note: Visit Date: 03/11/2023 PCP: Janith Lima, MD Referred by: Janith Lima, MD  Subjective: Chief Complaint  Patient presents with   Lower Back - Pain   HPI: Robert Church is a 67 y.o. male who comes in today for evaluation of chronic, worsening and severe bilateral lower back pain radiating to left buttock down posterior aspect of left leg down to knee. Pain ongoing for several years. Pain worsens with sitting. He describes pain as sore and aching sensation, currently rates as 8 out of 10. Some relief of pain with home exercise regimen, rest and use of medications. States he does take [facility] Powder daily. History of formal physical therapy, was unable to complete due to heart block and issues with fatigue. CT of lumbar spine from 2022 exhibits broad-based disc protrusion, centrally predominant at L5-S1. There is left foraminal stenosis at L4-L5 due to encroachment of bulging disc material that could affect the left L4 nerve. No high grade spinal canal stenosis noted. History of bilateral L5 transforaminal epidural steroid injection in our office on 08/17/2022, minimal relief of pain for 3 days. Patient reports he could not tell much difference in pain relief or functional ability post injection. History of lumbar epidural steroid injections with Dr. Suella Broad at [facility], he came to [place] for a period of time due to insurance change. He was re-evaluated by Dr. Nelva Bush in January of this year, Dr. Nelva Bush ordered CT guided lumbar myelogram, however patient did not complete as he states he did not hear back to schedule. Overall, minimal relief of pain with injections done in our office and by Dr. Nelva Bush. Patient denies focal weakness, numbness and tingling. No recent trauma or falls.   Patient's clinical course is complicated by heart failure, cardiac pacemaker, carotid artery calcification, chronic gout, diabetes  mellitus, obesity and tobacco use. He is treated by team of providers at [facility] including Dr. Glori Bickers, Dr. Allegra Lai and Dr. Quay Burow. Recently underwent cardiac catheterization with Dr. Haroldine Laws on 03/09/2023 to evaluate pacemaker function. He was recently taken off Eliquis.       Review of Systems  Musculoskeletal:  Positive for back pain.  Neurological:  Positive for tingling. Negative for focal weakness and weakness.  All other systems reviewed and are negative.  Otherwise per HPI.  Assessment & Plan: Visit Diagnoses:    ICD-10-CM   1. Chronic left-sided low back pain with left-sided sciatica  M54.42    G89.29     2. Lumbar radiculopathy  M54.16 Ambulatory referral to Physical Medicine Rehab    3. Facet arthropathy, lumbar  M47.816        Plan: Findings:  Chronic, worsening and severe bilateral lower back pain radiating to left buttock down posterior aspect of left leg down to knee. Patient continues to have severe pain despite good conservative therapies such as home exercise regimen, rest and use of medications. Has been unable to tolerate physical therapy. Patients clinical presentation and exam are consistent S1 nerve pattern. Next step is to perform diagnostic left L4-L5 interlaminar epidural steroid injection, he is not currently taking anticoagulant medication. He was recently taken off Eliquis by Dr. Haroldine Laws. Can review these notes in EPIC.  We would like to try interlaminar approach as recent transforaminal approach did not provide significant relief of pain. If good relief of pain we can repeat this procedure infrequently as needed.  He does continue to complain of non dermatomal pain to bilateral legs, he also experiences severe numbness and tingling in his feet which could be related to a neuropathy. If his pain persists we did discuss possibility of obtaining new CT imaging of lumbar spine. No red flag symptoms noted upon exam today.      Meds & Orders: No orders of the defined types were placed in this encounter.   Orders Placed This Encounter  Procedures   Ambulatory referral to Physical Medicine Rehab    Follow-up: Return for Left L4-L5 interlaminar epidural steroid injection.   Procedures: No procedures performed      Clinical History: CT of Cervical Spine IMPRESSION: 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 Bilateral foraminal stenosis additionally at this level that could affect either C6 nerve.   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 No apparent compressive stenosis at this level.   Chronic facet fusion on the right at C3-4. Right foraminal narrowing that could affect the right C4 nerve.   C4-5: Disc bulge and facet osteoarthritis worse on the right. Mild right foraminal stenosis.   C6-7: Bilateral foraminal narrowing of a moderate degree that could possibly be symptomatic.     Electronically Signed By: Nelson Chimes M.D. On: 06/03/2021 13:46 --------------------------------- CT of Lumbar Spine IMPRESSION: No apparent compressive central canal stenosis.   Mild non-compressive disc bulges L2-3 and L3-4.   Facet osteoarthritis at L4-5 left worse than right. Left foraminal narrowing due to encroachment by bulging disc material could possibly affect the left L4 nerve.   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 No apparent compressive stenosis of the canal or lateral recesses. Mild to moderate foraminal narrowing on the right could possibly affect the exiting L5 nerve.     Electronically Signed   By: Nelson Chimes M.D.   On: 06/03/2021 13:50   He reports that he has been smoking cigarettes. He has a 92.00 pack-year smoking history. He has been exposed to tobacco smoke. He has never used smokeless tobacco.  Recent Labs     06/05/22 1518 06/19/22 1404 09/15/22 1330  HGBA1C 6.4  --  6.6*  LABURIC 11.0* 7.1 11.0*    Objective:  VS:  HT:    WT:   BMI:     BP:   HR: bpm  TEMP: ( )  RESP:  Physical Exam Vitals and nursing note reviewed.  HENT:     Head: Normocephalic and atraumatic.     Right Ear: External ear normal.     Left Ear: External ear normal.     Nose: Nose normal.     Mouth/Throat:     Mouth: Mucous membranes are moist.  Eyes:     Extraocular Movements: Extraocular movements intact.  Cardiovascular:     Rate and Rhythm: Normal rate.     Pulses: Normal pulses.  Pulmonary:     Effort: Pulmonary effort is normal.  Abdominal:     General: Abdomen is flat. There is no distension.  Musculoskeletal:        General: Tenderness present.     Cervical back: Normal range of motion.     Comments: Patient is slow to rise from seated position to standing. Good lumbar range of motion. No pain noted with facet loading. 5/5 strength noted with bilateral hip flexion, knee flexion/extension, ankle dorsiflexion/plantarflexion and EHL. No clonus noted bilaterally. No  pain upon palpation of greater trochanters. No pain with internal/external rotation of bilateral hips. Sensation intact bilaterally. Dysesthesias noted to left S1 dermatome. Ambulates without aid, gait steady.     Skin:    General: Skin is warm and dry.     Capillary Refill: Capillary refill takes less than 2 seconds.  Neurological:     General: No focal deficit present.     Mental Status: He is alert and oriented to person, place, and time.  Psychiatric:        Mood and Affect: Mood normal.        Behavior: Behavior normal.     Ortho Exam  Imaging: No results found.  Past Medical/Family/Surgical/Social History: Medications & Allergies reviewed per EMR, new medications updated. Patient Active Problem List   Diagnosis Date Noted   Bilateral primary osteoarthritis of knee 06/30/2022   Subacute maxillary sinusitis 06/29/2022    Prostate cancer screening 06/29/2022   Hypogonadism in male 06/07/2022   History of gout 06/07/2022   Acute midline low back pain without sciatica 06/07/2022   Chronic respiratory failure with hypoxia ([place]) 11/24/2021   Encounter for general adult medical examination with abnormal findings 08/20/2021   GAD (generalized anxiety disorder) 07/04/2021   Degenerative disc disease, cervical 04/21/2021   AV block, 3rd degree ([place]) 04/16/2021   Carotid artery calcification 04/09/2021   Panlobular emphysema ([place]) 03/25/2021   Atherosclerosis of aorta ([place]) 03/25/2021   Body mass index (BMI) 32.0-32.9, adult 01/15/2021   Lumbar spondylosis 01/10/2021   Foraminal stenosis of cervical region 01/10/2021   Chronic bilateral low back pain without sciatica 01/09/2021   Type II diabetes mellitus with manifestations ([place]) 11/29/2020   Primary osteoarthritis of both knees 09/24/2020   Idiopathic chronic gout of foot without tophus 08/08/2020   Yellow jacket sting allergy 08/08/2020   Class 2 severe obesity due to excess calories with serious comorbidity and body mass index (BMI) of 38.0 to 38.9 in adult ([place]) 08/05/2020   Hyperlipidemia LDL goal <70 07/11/2020   Hypertriglyceridemia 07/08/2020   Routine general medical examination at a health care facility [id number]   Umbilical hernia without obstruction and without gangrene 07/08/2020   Chronic combined systolic and diastolic CHF (congestive heart failure) ([place]) 01/09/2020   Solitary pulmonary nodule 08/15/2019   Anal fissure 06/07/2019   NICM (nonischemic cardiomyopathy) ([place]) 12/16/2018   History of adenomatous polyp of colon 11/25/2018   Cigarette smoker 01/16/2015   COPD (chronic obstructive pulmonary disease) ([place]) 06/15/2014   Obstructive sleep apnea syndrome 01/05/2013   Asbestos exposure 01/05/2013   Seasonal allergic rhinitis 10/06/2012   GERD (gastroesophageal reflux disease) 06/05/2012   Intrinsic asthma 07/23/2011   HTN (hypertension)  07/23/2011   Past Medical History:  Diagnosis Date   Adenomatous colon polyp    Allergy    Anal fissure    Arthritis    Asthma    Bronchitis    CHF (congestive heart failure) ([place])    COPD, group D, by GOLD 2017 classification ([place])    Dyspnea    Emphysema lung (HCC)    GERD (gastroesophageal reflux disease)    History of hiatal hernia    Hyperlipidemia    Hypertension    NICM (nonischemic cardiomyopathy) (HCC)    OSA (obstructive sleep apnea)    Pneumonia    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   Requires supplemental oxygen    Sinusitis    SOB (shortness of breath)    Family History  Problem Relation  Age of Onset   Lung cancer Father    High blood pressure Mother    Diabetes Maternal Grandmother    Colon polyps Neg Hx    Esophageal cancer Neg Hx    Pancreatic cancer Neg Hx    Stomach cancer Neg Hx    Past Surgical History:  Procedure Laterality Date   BIV UPGRADE N/A 10/07/2021   Procedure: BIV PPM UPGRADE;  Surgeon: Constance Haw, MD;  Location: [place] [facility];  Service: Cardiovascular;  Laterality: N/A;   BIV UPGRADE N/A 11/11/2022   Procedure: BIV PPM UPGRADE;  Surgeon: Constance Haw, MD;  Location: [place] [facility];  Service: Cardiovascular;  Laterality: N/A;   LEFT HEART CATH AND CORONARY ANGIOGRAPHY N/A 04/19/2017   Procedure: Left Heart Cath and Coronary Angiography;  Surgeon: Belva Crome, MD;  Location: [place] [facility];  Service: Cardiovascular;  Laterality: N/A;   PACEMAKER IMPLANT N/A 04/16/2021   Procedure: PACEMAKER IMPLANT;  Surgeon: Constance Haw, MD;  Location: [place] [facility];  Service: Cardiovascular;  Laterality: N/A;   RIGHT HEART CATH N/A 03/09/2023   Procedure: RIGHT HEART CATH;  Surgeon: Jolaine Artist, MD;  Location: [place] [facility];  Service: Cardiovascular;  Laterality: N/A;   RIGHT/LEFT HEART CATH AND CORONARY ANGIOGRAPHY N/A 08/22/2021   Procedure: RIGHT/LEFT HEART CATH AND CORONARY  ANGIOGRAPHY;  Surgeon: Jolaine Artist, MD;  Location: [place];  Service: Cardiovascular;  Laterality: N/A;   TONSILLECTOMY     Social History   Occupational History   Occupation: disabled  Tobacco Use   Smoking status: Some Days    Packs/day: 2.00    Years: 46.00    Additional pack years: 0.00    Total pack years: 92.00    Types: Cigarettes    Last attempt to quit: 01/27/2022    Years since quitting: 1.1    Passive exposure: Past   Smokeless tobacco: Never   Tobacco comments:    Had 3 cigarettes in past 2 weeks ARJ 10/28/22  Vaping Use   Vaping Use: Never used  Substance and Sexual Activity   Alcohol use: Not Currently    Alcohol/week: 28.0 standard drinks of alcohol    Types: 28 Cans of beer per week   Drug use: No   Sexual activity: Yes    Partners: Female    Birth control/protection: None

## 2023-03-16 ENCOUNTER — Encounter: Payer: Medicare Other | Admitting: Cardiology

## 2023-03-17 ENCOUNTER — Other Ambulatory Visit: Payer: Self-pay | Admitting: Emergency Medicine

## 2023-03-17 ENCOUNTER — Ambulatory Visit (HOSPITAL_COMMUNITY)
Admission: RE | Admit: 2023-03-17 | Discharge: 2023-03-17 | Disposition: A | Discharge status: Home / Self Care | Payer: Medicare Other | Source: Ambulatory Visit | Attending: Internal Medicine | Admitting: Internal Medicine

## 2023-03-17 ENCOUNTER — Encounter: Payer: Self-pay | Admitting: Internal Medicine

## 2023-03-17 ENCOUNTER — Ambulatory Visit (INDEPENDENT_AMBULATORY_CARE_PROVIDER_SITE_OTHER): Payer: Medicare Other | Admitting: Internal Medicine

## 2023-03-17 VITALS — BP 112/70 | HR 94 | Temp 98.0°F | Ht 70.0 in | Wt 232.0 lb

## 2023-03-17 DIAGNOSIS — E118 Type 2 diabetes mellitus with unspecified complications: Secondary | ICD-10-CM

## 2023-03-17 DIAGNOSIS — E785 Hyperlipidemia, unspecified: Secondary | ICD-10-CM | POA: Diagnosis not present

## 2023-03-17 DIAGNOSIS — M1A0790 Idiopathic chronic gout, unspecified ankle and foot, without tophus (tophi): Secondary | ICD-10-CM

## 2023-03-17 DIAGNOSIS — E781 Pure hyperglyceridemia: Secondary | ICD-10-CM | POA: Diagnosis not present

## 2023-03-17 DIAGNOSIS — Z122 Encounter for screening for malignant neoplasm of respiratory organs: Secondary | ICD-10-CM | POA: Insufficient documentation

## 2023-03-17 DIAGNOSIS — M7022 Olecranon bursitis, left elbow: Secondary | ICD-10-CM

## 2023-03-17 DIAGNOSIS — I700 Atherosclerosis of aorta: Secondary | ICD-10-CM | POA: Diagnosis not present

## 2023-03-17 DIAGNOSIS — Z23 Encounter for immunization: Secondary | ICD-10-CM

## 2023-03-17 DIAGNOSIS — F17210 Nicotine dependence, cigarettes, uncomplicated: Secondary | ICD-10-CM | POA: Insufficient documentation

## 2023-03-17 DIAGNOSIS — J439 Emphysema, unspecified: Secondary | ICD-10-CM | POA: Diagnosis not present

## 2023-03-17 DIAGNOSIS — Z87891 Personal history of nicotine dependence: Secondary | ICD-10-CM

## 2023-03-17 DIAGNOSIS — K7030 Alcoholic cirrhosis of liver without ascites: Secondary | ICD-10-CM

## 2023-03-17 DIAGNOSIS — I2510 Atherosclerotic heart disease of native coronary artery without angina pectoris: Secondary | ICD-10-CM | POA: Insufficient documentation

## 2023-03-17 DIAGNOSIS — Z1211 Encounter for screening for malignant neoplasm of colon: Secondary | ICD-10-CM

## 2023-03-17 LAB — LIPID PANEL
Cholesterol: 221 mg/dL — ABNORMAL HIGH (ref 0–200)
HDL: 45.5 mg/dL (ref >39.00)
LDL Cholesterol: 136 mg/dL — ABNORMAL HIGH (ref 0–99)
NonHDL: 175.61
Total CHOL/HDL Ratio: 5
Triglycerides: 196 mg/dL — ABNORMAL HIGH (ref 0.0–149.0)
VLDL: 39.2 mg/dL (ref 0.0–40.0)

## 2023-03-17 LAB — URIC ACID: Uric Acid, Serum: 10.7 mg/dL — ABNORMAL HIGH (ref 4.0–7.8)

## 2023-03-17 LAB — HEMOGLOBIN A1C: Hgb A1c MFr Bld: 6.5 % (ref 4.6–6.5)

## 2023-03-17 MED ORDER — RYBELSUS 3 MG PO TABS: 3.0000 mg | ORAL_TABLET | Freq: Every day | ORAL | 0 refills | Status: DC

## 2023-03-17 MED ORDER — BOOSTRIX 5-2.5-18.5 LF-MCG/0.5 IM SUSP: 0.5000 mL | Freq: Once | INTRAMUSCULAR | 0 refills | Status: AC

## 2023-03-17 NOTE — Progress Notes (Unsigned)
Subjective:  Patient ID: Robert Church, male    DOB: 1956/03/31  Age: 67 y.o. MRN: [id number]  CC: COPD, Hyperlipidemia, Congestive Heart Failure, and Diabetes   HPI OTHIE PICKAR presents for f/up ----   Outpatient Medications Prior to Visit  Medication Sig Dispense Refill   albuterol (PROVENTIL) (2.5 MG/3ML) 0.083% nebulizer solution Take 3 mLs (2.5 mg total) by nebulization every 4 (four) hours as needed for wheezing or shortness of breath. 75 mL 11   albuterol (VENTOLIN HFA) 108 (90 Base) MCG/ACT inhaler INHALE 2 PUFFS INTO THE LUNGS EVERY 6 HOURS AS NEEDED FOR WHEEZING 18 g 4   ALPRAZolam (XANAX) 0.5 MG tablet TAKE 1 TABLET(0.5 MG) BY MOUTH THREE TIMES DAILY AS NEEDED FOR ANXIETY (Patient taking differently: Take 0.5 mg by mouth 3 (three) times daily.) 270 tablet 0   AMBULATORY NON FORMULARY MEDICATION Medication Name: Nitroglycerin gel 0.125% apply 2-3 times daily to the anal canal. (Patient taking differently: Place 1 Application rectally daily as needed (Fissure). Medication Name: Nitroglycerin gel 0.125% apply 2-3 times daily to the anal canal.) 30 g 1   Aspirin-Caffeine (BC FAST PAIN RELIEF PO) Take 1 packet by mouth daily.     cholecalciferol (VITAMIN D3) 25 MCG (1000 UNIT) tablet Take 1,000 Units by mouth daily.     colchicine 0.6 MG tablet Take 0.6 mg by mouth daily as needed (gout pain).     diclofenac Sodium (VOLTAREN) 1 % GEL Apply 1 Application topically daily at 12 noon.     EPINEPHrine 0.3 mg/0.3 mL IJ SOAJ injection Inject 0.3 mg into the muscle as needed for anaphylaxis. 1 each 0   fluticasone (FLONASE) 50 MCG/ACT nasal spray SHAKE LIQUID AND USE 2 SPRAYS IN EACH NOSTRIL DAILY (Patient taking differently: Place 1 spray into both nostrils daily as needed for allergies.) 16 g 2   guaiFENesin (MUCINEX) 600 MG 12 hr tablet Take 1 tablet (600 mg total) by mouth 2 (two) times daily. (Patient taking differently: Take 600 mg by mouth 2 (two) times daily as needed for cough or to  loosen phlegm.) 60 tablet 5   lidocaine (XYLOCAINE) 5 % ointment Apply topically 3 (three) times daily as needed. (Patient taking differently: Apply 1 Application topically daily as needed (Fissure).) 35.44 g 1   Lidocaine, Anorectal, 5 % CREA Apply 1 application topically 2 (two) times daily. (Patient taking differently: Apply 1 application  topically daily as needed (Fissure).) 30 g 1   loratadine (CLARITIN) 10 MG tablet Take 10 mg by mouth daily.     metolazone (ZAROXOLYN) 2.5 MG tablet Take 1 tablet (2.5 mg total) by mouth as directed. (Patient taking differently: Take 2.5 mg by mouth daily as needed (swelling).) 5 tablet 1   mometasone-formoterol (DULERA) 100-5 MCG/ACT AERO Inhale 2 puffs into the lungs 2 (two) times daily. 13 g 5   montelukast (SINGULAIR) 10 MG tablet TAKE 1 TABLET BY MOUTH EVERYDAY AT BEDTIME 30 tablet 2   nicotine (NICODERM CQ - DOSED IN MG/24 HOURS) 14 mg/24hr patch Place 1 patch (14 mg total) onto the skin daily. 28 patch 0   omeprazole (PRILOSEC) 20 MG capsule TAKE 1 CAPSULE(20 MG) BY MOUTH TWICE DAILY BEFORE A MEAL FOR ACID REFLUX 180 capsule 0   potassium chloride SA (KLOR-CON M20) 20 MEQ tablet Take 1 tablet (20 mEq total) by mouth daily. Take an extra 40 meq when you take metolazone 90 tablet 3   predniSONE (DELTASONE) 10 MG tablet TAKE 1 TABLET BY MOUTH EVERY  DAY WITH BREAKFAST 30 tablet 3   roflumilast (DALIRESP) 500 MCG TABS tablet Take 1 tablet (500 mcg total) by mouth daily. (Patient taking differently: Take 250 mcg by mouth 2 (two) times daily.) 30 tablet 2   Testosterone 20.25 MG/1.25GM (1.62%) GEL Place 1.25 g onto the skin daily. (Patient taking differently: Place 1 Pump onto the skin every other day.) 112.5 g 0   Tiotropium Bromide Monohydrate (SPIRIVA RESPIMAT) 2.5 MCG/ACT AERS Inhale 2 puffs into the lungs daily. (Patient taking differently: Inhale 1 puff into the lungs 2 (two) times daily.) 4 g 4   torsemide (DEMADEX) 20 MG tablet Take 2 tablets (40 mg  total) by mouth daily. 60 tablet 11   vitamin B-12 (CYANOCOBALAMIN) 1000 MCG tablet Take 1,000 mcg by mouth daily.     allopurinol (ZYLOPRIM) 300 MG tablet Take 1 tablet (300 mg total) by mouth daily. (Patient taking differently: Take 300 mg by mouth daily as needed (gout flare).) 90 tablet 0   ipratropium (ATROVENT) 0.06 % nasal spray Place 2 sprays into both nostrils 4 (four) times daily. (Patient taking differently: Place 2 sprays into both nostrils 4 (four) times daily as needed for rhinitis.) 15 mL 3   losartan (COZAAR) 25 MG tablet Take 1 tablet (25 mg total) by mouth daily. (Patient not taking: Reported on 03/17/2023) 90 tablet 3   No facility-administered medications prior to visit.    ROS Review of Systems  Objective:  BP 112/70 (BP Location: Left Arm, Patient Position: Sitting, Cuff Size: Normal)   Pulse 94   Temp 98 F (36.7 C) (Oral)   Ht 5\' 10"  (1.778 m)   Wt 232 lb (105.2 kg)   SpO2 91%   BMI 33.29 kg/m   BP Readings from Last 3 Encounters:  03/17/23 112/70  03/09/23 128/82  03/05/23 120/78    Wt Readings from Last 3 Encounters:  03/17/23 232 lb (105.2 kg)  03/09/23 229 lb (103.9 kg)  03/05/23 233 lb (105.7 kg)    Physical Exam Abdominal:     General: Abdomen is protuberant. Bowel sounds are normal. There is no distension.     Palpations: There is no hepatomegaly or splenomegaly.     Tenderness: There is no abdominal tenderness.     Lab Results  Component Value Date   WBC 8.4 02/26/2023   HGB 14.6 03/09/2023   HCT 43.0 03/09/2023   PLT 216 02/26/2023   GLUCOSE 131 (H) 02/26/2023   CHOL 221 (H) 03/17/2023   TRIG 196.0 (H) 03/17/2023   HDL 45.50 03/17/2023   LDLDIRECT 157.0 09/15/2022   LDLCALC 136 (H) 03/17/2023   ALT 25 01/13/2023   AST 25 01/13/2023   NA 138 03/09/2023   K 3.8 03/09/2023   CL 97 02/26/2023   CREATININE 1.02 02/26/2023   BUN 21 02/26/2023   CO2 27 02/26/2023   TSH 0.39 03/17/2023   PSA 3.24 06/29/2022   INR 0.9 01/29/2022    HGBA1C 6.5 03/17/2023   MICROALBUR <0.7 09/15/2022    CT CHEST LUNG CA SCREEN LOW DOSE W/O CM  Result Date: 03/18/2023 CLINICAL DATA:  32 pack-year smoking history/current smoker EXAM: CT CHEST WITHOUT CONTRAST LOW-DOSE FOR LUNG CANCER SCREENING TECHNIQUE: Multidetector CT imaging of the chest was performed following the standard protocol without IV contrast. RADIATION DOSE REDUCTION: This exam was performed according to the departmental dose-optimization program which includes automated exposure control, adjustment of the mA and/or kV according to patient size and/or use of iterative reconstruction technique. COMPARISON:  03/16/2022  FINDINGS: Cardiovascular: Aortic atherosclerosis. Tortuous thoracic aorta. Mild cardiomegaly with lipomatous hypertrophy of the intra-atrial septum. Dual lead pacer. Lad coronary artery calcification. Pulmonary artery enlargement, outflow tract 3.3 cm. Mediastinum/Nodes: No mediastinal or hilar adenopathy, given limitations of unenhanced CT. Lungs/Pleura: No pleural fluid. Moderate centrilobular emphysema. Smoking related respiratory bronchiolitis. New minimal subsegmental atelectasis in the dependent left upper lobe. Pulmonary nodules of maximally volume derived equivalent diameter 3.9 mm, similar. Upper Abdomen: Caudate lobe enlargement with subtle irregular hepatic capsule. Normal imaged portions of the spleen, stomach, pancreas, gallbladder, adrenal glands, left kidney. Musculoskeletal: Mild left-sided gynecomastia. Minimal convex right thoracic spine curvature. IMPRESSION: 1. Lung-RADS 2, benign appearance or behavior. Continue annual screening with low-dose chest CT without contrast in 12 months. 2. Pulmonary artery enlargement suggests pulmonary arterial hypertension. 3. Hepatic morphology which is suspicious for cirrhosis, especially given the EMR history of 28 alcoholic drinks per week. 4. Aortic Atherosclerosis (ICD10-I70.0) and Emphysema (ICD10-J43.9). Coronary  artery atherosclerosis. Electronically Signed   By: Abigail Miyamoto M.D.   On: 03/18/2023 12:22    Assessment & Plan:  Hyperlipidemia LDL goal <70 -     Lipid panel; Future -     TSH; Future -     Pravastatin Sodium; Take 1 tablet (10 mg total) by mouth daily.  Dispense: 90 tablet; Refill: 1  Hypertriglyceridemia -     Lipid panel; Future -     Icosapent Ethyl; Take 2 capsules (2 g total) by mouth 2 (two) times daily.  Dispense: 360 capsule; Refill: 1  Idiopathic chronic gout of foot without tophus, unspecified laterality -     Uric acid; Future -     Allopurinol; Take 1 tablet (300 mg total) by mouth daily.  Dispense: 90 tablet; Refill: 1  Type II diabetes mellitus with manifestations ([place]) -     Hemoglobin A1c; Future -     Rybelsus; Take 1 tablet (3 mg total) by mouth daily.  Dispense: 30 tablet; Refill: 0 -     HM Diabetes Foot Exam  Need for prophylactic vaccination with combined diphtheria-tetanus-pertussis (DTP) vaccine -     Boostrix; Inject 0.5 mLs into the muscle once for 1 dose.  Dispense: 0.5 mL; Refill: 0  Olecranon bursitis of left elbow -     Ambulatory referral to Sports Medicine  Alcoholic cirrhosis of liver without ascites ([facility])  Atherosclerosis of aorta ([facility]) -     Icosapent Ethyl; Take 2 capsules (2 g total) by mouth 2 (two) times daily.  Dispense: 360 capsule; Refill: 1     Follow-up: Return in about 4 months (around 07/17/2023).  Scarlette Calico, MD

## 2023-03-17 NOTE — Patient Instructions (Signed)

## 2023-03-18 ENCOUNTER — Other Ambulatory Visit: Payer: Self-pay | Admitting: Acute Care

## 2023-03-18 DIAGNOSIS — F17210 Nicotine dependence, cigarettes, uncomplicated: Secondary | ICD-10-CM

## 2023-03-18 DIAGNOSIS — Z87891 Personal history of nicotine dependence: Secondary | ICD-10-CM

## 2023-03-18 DIAGNOSIS — Z23 Encounter for immunization: Secondary | ICD-10-CM | POA: Insufficient documentation

## 2023-03-18 DIAGNOSIS — Z122 Encounter for screening for malignant neoplasm of respiratory organs: Secondary | ICD-10-CM

## 2023-03-18 LAB — TSH: TSH: 0.39 u[IU]/mL (ref 0.35–5.50)

## 2023-03-18 MED ORDER — ALLOPURINOL 300 MG PO TABS: 300.0000 mg | ORAL_TABLET | Freq: Every day | ORAL | 1 refills | Status: DC

## 2023-03-19 DIAGNOSIS — K7030 Alcoholic cirrhosis of liver without ascites: Secondary | ICD-10-CM | POA: Insufficient documentation

## 2023-03-19 MED ORDER — ICOSAPENT ETHYL 1 G PO CAPS: 2.0000 g | ORAL_CAPSULE | Freq: Two times a day (BID) | ORAL | 1 refills | Status: DC

## 2023-03-19 MED ORDER — PRAVASTATIN SODIUM 10 MG PO TABS: 10.0000 mg | ORAL_TABLET | Freq: Every day | ORAL | 1 refills | Status: DC

## 2023-03-21 ENCOUNTER — Other Ambulatory Visit: Payer: Self-pay | Admitting: Emergency Medicine

## 2023-03-21 DIAGNOSIS — Z1211 Encounter for screening for malignant neoplasm of colon: Secondary | ICD-10-CM | POA: Insufficient documentation

## 2023-03-22 ENCOUNTER — Telehealth: Payer: Self-pay | Admitting: Acute Care

## 2023-03-22 ENCOUNTER — Telehealth: Payer: Self-pay | Admitting: Internal Medicine

## 2023-03-22 NOTE — Telephone Encounter (Signed)
Patient is requesting call back to discuss CT scan results. Call back number [phone]

## 2023-03-22 NOTE — Telephone Encounter (Signed)
Pt has called inquiring about his liver cirrhosis severity. He would like to know if it is moderate or severe. Please advise.

## 2023-03-22 NOTE — Telephone Encounter (Signed)
Calling back on results. Adv waiting for Ms. Robert Church to call him.

## 2023-03-22 NOTE — Telephone Encounter (Signed)
Pls call PT regarding the CT results. His # is [phone].  Screening PT.

## 2023-03-22 NOTE — Telephone Encounter (Signed)
Robert Church can you please advise on CT scan results?

## 2023-03-23 ENCOUNTER — Telehealth: Payer: Self-pay

## 2023-03-23 NOTE — Telephone Encounter (Signed)
Attempted to contact pt. Left message for pt to call back regarding CT results.

## 2023-03-23 NOTE — Telephone Encounter (Signed)
Pt returned call about missing a call about results. Routing encounter back to lung nodule pool.

## 2023-03-23 NOTE — Telephone Encounter (Signed)
Pt called office again about still not receiving a call about his recent CT. Please advise.

## 2023-03-23 NOTE — Telephone Encounter (Signed)
Approved. . Authorization Expiration Date: March 22, 2024.

## 2023-03-23 NOTE — Telephone Encounter (Signed)
Key: [id number]

## 2023-03-24 NOTE — Telephone Encounter (Signed)
Spoke with pt and reviewed lung cancer screening CT results. Small nodules noted that will be followed at yearly scans, Some areas showing possible congestion in the lungs. Pt reports that he did have covid recently and is stilling having some issues with congestion. Discussed possible PAH. Pt does see cardiologist regularly.Stressed importance of maintaining a stable blood pressure. Discussed possible cirrhosis noted. Pt has a call in to PCP to discuss this further . PT aware we will repeat lung screening Ct in 1 yr.

## 2023-03-24 NOTE — Telephone Encounter (Signed)
Pt has been informed and expressed understanding. However, pt has some additional questions to discuss with PCP so he has scheduled an OV for 4/15 @ 2.20pm.

## 2023-03-24 NOTE — Telephone Encounter (Signed)
Patient is requesting a call back about the severity of his liver cirrhosis. He would like a call back at [phone].

## 2023-03-25 ENCOUNTER — Encounter: Payer: Self-pay | Admitting: Family Medicine

## 2023-03-25 ENCOUNTER — Other Ambulatory Visit: Payer: Self-pay

## 2023-03-25 ENCOUNTER — Ambulatory Visit (INDEPENDENT_AMBULATORY_CARE_PROVIDER_SITE_OTHER): Payer: Medicare Other | Admitting: Family Medicine

## 2023-03-25 VITALS — BP 122/80 | HR 86 | Ht 70.0 in | Wt 230.8 lb

## 2023-03-25 DIAGNOSIS — M7022 Olecranon bursitis, left elbow: Secondary | ICD-10-CM | POA: Diagnosis not present

## 2023-03-25 NOTE — Patient Instructions (Signed)
Thank you for coming in today.   Please use Voltaren gel (Generic Diclofenac Gel) up to 4x daily for pain as needed.  This is available over-the-counter as both the name brand Voltaren gel and the generic diclofenac gel.   I recommend you obtained a compression sleeve to help with your joint problems. There are many options on the market however I recommend obtaining a full elbow Body Helix compression sleeve.  You can find information (including how to appropriate measure yourself for sizing) can be found at www.Body http://www.lambert.com/.  Many of these products are health savings account (HSA) eligible.   You can use the compression sleeve at any time throughout the day but is most important to use while being active as well as for 2 hours post-activity.   It is appropriate to ice following activity with the compression sleeve in place.   Recheck in 5 weeks

## 2023-03-25 NOTE — Progress Notes (Signed)
   I, Josepha Pigg, CMA acting as a Education administrator for Lynne Leader, MD.  Subjective:    CC: Left elbow pain  HPI: Patient is a 67 year old male presenting with left elbow pain x .  Patient locates pain to the elbow. Hx of DVT, resolved, continues to have pain in the area. The area is TTP. Sx flare when propping elbow on table to read. The elbow feel gelatinous to pt.   Radiates: denies Paresthesia: yes, related to the back per pt Aggravates: palpation Treatments tried:  none  Pertinent review of Systems: No fevers or chills.  Relevant historical information: Patient had a left upper extremity DVT and completed anticoagulation about 3 months ago.   Objective:    Vitals:   03/25/23 1352  BP: 122/80  Pulse: 86  SpO2: 93%   General: Well Developed, well nourished, and in no acute distress.   MSK: Left elbow: Swelling overlying olecranon.  Tender palpation minimally at olecranon prominence.  Normal elbow motion and strength.     Impression and Recommendations:    Assessment and Plan: 67 y.o. 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  He habitually rests his elbows on hard objects which seems to be the inciting factor.  We talked about options.  He would like to try 1 more conservative management trial.  Will use compressive padded elbow sleeve (body helix).  Recheck in 1 month if not better consider steroid injection and aspiration.  PDMP not reviewed this encounter. No orders of the defined types were placed in this encounter.  No orders of the defined types were placed in this encounter.   Discussed warning signs or symptoms. Please see discharge instructions. Patient expresses understanding.   The above documentation has been reviewed and is accurate and complete Lynne Leader, M.D.

## 2023-03-28 DIAGNOSIS — J9610 Chronic respiratory failure, unspecified whether with hypoxia or hypercapnia: Secondary | ICD-10-CM | POA: Diagnosis not present

## 2023-03-28 DIAGNOSIS — J449 Chronic obstructive pulmonary disease, unspecified: Secondary | ICD-10-CM | POA: Diagnosis not present

## 2023-03-29 ENCOUNTER — Ambulatory Visit (INDEPENDENT_AMBULATORY_CARE_PROVIDER_SITE_OTHER): Payer: Medicare Other | Admitting: Physical Medicine and Rehabilitation

## 2023-03-29 ENCOUNTER — Other Ambulatory Visit: Payer: Self-pay

## 2023-03-29 VITALS — BP 113/74 | HR 108

## 2023-03-29 DIAGNOSIS — J449 Chronic obstructive pulmonary disease, unspecified: Secondary | ICD-10-CM | POA: Diagnosis not present

## 2023-03-29 DIAGNOSIS — M5416 Radiculopathy, lumbar region: Secondary | ICD-10-CM

## 2023-03-29 MED ORDER — METHYLPREDNISOLONE ACETATE 80 MG/ML IJ SUSP
80.0000 mg | Freq: Once | INTRAMUSCULAR | Status: AC
  Administered 2023-03-29: 80 mg

## 2023-03-29 NOTE — Progress Notes (Signed)
Remote pacemaker transmission.   

## 2023-03-29 NOTE — Progress Notes (Signed)
Functional Pain Scale - descriptive words and definitions  Distracting (5)    Aware of pain/able to complete some ADL's but limited by pain/sleep is affected and active distractions are only slightly useful. Moderate range order  Average Pain 7   +Driver, -BT, -Dye Allergies.  Lower back pain on left side that radiates into the left leg

## 2023-03-29 NOTE — Patient Instructions (Signed)

## 2023-04-05 ENCOUNTER — Other Ambulatory Visit: Payer: Self-pay | Admitting: Internal Medicine

## 2023-04-05 ENCOUNTER — Encounter: Payer: Self-pay | Admitting: Internal Medicine

## 2023-04-05 ENCOUNTER — Ambulatory Visit (INDEPENDENT_AMBULATORY_CARE_PROVIDER_SITE_OTHER): Payer: Medicare Other | Admitting: Internal Medicine

## 2023-04-05 VITALS — BP 124/68 | HR 88 | Temp 97.7°F | Resp 16 | Ht 70.0 in | Wt 229.0 lb

## 2023-04-05 DIAGNOSIS — I10 Essential (primary) hypertension: Secondary | ICD-10-CM

## 2023-04-05 DIAGNOSIS — K7030 Alcoholic cirrhosis of liver without ascites: Secondary | ICD-10-CM | POA: Diagnosis not present

## 2023-04-05 DIAGNOSIS — F411 Generalized anxiety disorder: Secondary | ICD-10-CM

## 2023-04-05 DIAGNOSIS — I700 Atherosclerosis of aorta: Secondary | ICD-10-CM | POA: Diagnosis not present

## 2023-04-05 DIAGNOSIS — J431 Panlobular emphysema: Secondary | ICD-10-CM

## 2023-04-05 LAB — BASIC METABOLIC PANEL
BUN: 21 mg/dL (ref 6–23)
CO2: 31 mEq/L (ref 19–32)
Calcium: 9.2 mg/dL (ref 8.4–10.5)
Chloride: 97 mEq/L (ref 96–112)
Creatinine, Ser: 0.81 mg/dL (ref 0.40–1.50)
GFR: 93.1 mL/min (ref >60.00)
Glucose, Bld: 124 mg/dL — ABNORMAL HIGH (ref 70–99)
Potassium: 3.7 mEq/L (ref 3.5–5.1)
Sodium: 139 mEq/L (ref 135–145)

## 2023-04-05 LAB — HEPATIC FUNCTION PANEL
ALT: 20 U/L (ref 0–53)
AST: 20 U/L (ref 0–37)
Albumin: 4.2 g/dL (ref 3.5–5.2)
Alkaline Phosphatase: 49 U/L (ref 39–117)
Bilirubin, Direct: 0.1 mg/dL (ref 0.0–0.3)
Total Bilirubin: 0.6 mg/dL (ref 0.2–1.2)
Total Protein: 6.7 g/dL (ref 6.0–8.3)

## 2023-04-05 LAB — PROTIME-INR
INR: 1 ratio (ref 0.8–1.0)
Prothrombin Time: 10.6 s (ref 9.6–13.1)

## 2023-04-05 MED ORDER — ALPRAZOLAM 0.5 MG PO TABS: 0.5000 mg | ORAL_TABLET | Freq: Three times a day (TID) | ORAL | 0 refills | Status: DC

## 2023-04-05 NOTE — Patient Instructions (Signed)
Cirrhosis  Cirrhosis is long-term (chronic) liver injury. The liver is the body's largest internal organ, and it performs many functions. It converts food into energy, removes toxic material from the blood, makes important proteins, and absorbs necessary vitamins from food. In cirrhosis, healthy liver cells are replaced by scar tissue. This prevents blood from flowing through the liver and makes it difficult for the liver to complete its functions. What are the causes? Common causes of this condition are hepatitis C and long-term alcohol abuse. Other causes include: Nonalcoholic fatty liver disease (NAFLD). This happens when fat is deposited in the liver by causes other than alcohol. Hepatitis B infection. Autoimmune hepatitis. In this condition, the body's defense system (immune system) mistakenly attacks the liver cells, causing inflammation. Diseases that cause blockage of ducts inside the liver. Inherited liver diseases, such as hemochromatosis. This is one of the most common inherited liver diseases. In this disease, deposits of iron collect in the liver and other organs. Reactions to certain long-term medicines, such as amiodarone, a heart medicine. Parasitic infections. These include schistosomiasis, which is caused by a flatworm. Long-term contact to certain toxins. These toxins include certain organic solvents, such as toluene and chloroform. What increases the risk? You are more likely to develop this condition if: You have certain types of viral hepatitis. You abuse alcohol, especially if you are male. You are overweight. You use IV drugs and share needles. You have unprotected sex with someone who has viral hepatitis. What are the signs or symptoms? You may not have any signs and symptoms at first. Symptoms may not develop until the damage to your liver starts to get worse. Early symptoms may include: Weakness and tiredness (fatigue). Changes in sleep patterns or having trouble  sleeping. Itchiness. Tenderness in the right-upper part of your abdomen. Weight loss and muscle loss. Nausea. Loss of appetite. Later symptoms may include: Fatigue or weakness that is getting worse. Yellow skin and eyes (jaundice). Buildup of fluid in the abdomen (ascites). You may notice that your clothes are tight around your waist. Weight gain and swelling of the feet and ankles (edema). Trouble breathing. Easy bruising and bleeding. Vomiting blood, or black or bloody stool. Mental confusion. How is this diagnosed? Your health care provider may suspect cirrhosis based on your symptoms and medical history, especially if you have other medical conditions or a history of alcohol abuse. Your health care provider will do a physical exam to feel your liver and to check for signs of cirrhosis. Tests may include: Blood tests to check: For hepatitis B or C. Kidney function. Liver function. Imaging tests such as: MRI or CT scan to look for changes seen in advanced cirrhosis. Ultrasound to see if normal liver tissue is being replaced by scar tissue. A procedure in which a long needle is used to take a sample of liver tissue to be checked in a lab (biopsy). Liver biopsy can confirm the diagnosis of cirrhosis. How is this treated? Treatment for this condition depends on how damaged your liver is and what caused the damage. It may include treating the symptoms of cirrhosis, or treating the underlying causes to slow the damage. Treatment may include: Making lifestyle changes, such as: Eating a healthy diet. You may need to work with your health care provider or a dietitian to develop an eating plan. Restricting salt intake. Maintaining a healthy weight. Not abusing drugs or alcohol. Taking medicines to: Treat liver infections or other infections. Control itching. Reduce fluid buildup. Reduce certain blood   toxins. Reduce risk of bleeding from enlarged blood vessels in the stomach or esophagus  (varices). Liver transplant. In this procedure, a liver from a donor is used to replace your diseased liver. This is done if cirrhosis has caused liver failure. Other treatments and procedures may be done depending on the problems that you get from cirrhosis. Common problems include liver-related kidney failure (hepatorenal syndrome). Follow these instructions at home:  Take medicines only as told by your health care provider. Do not use medicines that are toxic to your liver. Ask your health care provider before taking any new medicines, including over-the-counter medicines such as NSAIDs. Rest as needed. Eat a well-balanced diet. Limit your salt or water intake, if your health care provider asks you to do this. Do not drink alcohol. This is especially important if you routinely take acetaminophen. Keep all follow-up visits. This is important. Contact a health care provider if you: Have fatigue or weakness that is getting worse. Develop swelling of the hands, feet, or legs, or a buildup of fluid in the abdomen (ascites). Have a fever or chills. Develop loss of appetite. Have nausea or vomiting. Develop jaundice. Develop easy bruising or bleeding. Get help right away if you: Vomit bright red blood or a material that looks like coffee grounds. Have blood in your stools. Notice that your stools appear black and tarry. Become confused. Have chest pain or trouble breathing. These symptoms may represent a serious problem that is an emergency. Do not wait to see if the symptoms will go away. Get medical help right away. Call your local emergency services (911 in the [place] Do not drive yourself to the hospital. Summary Cirrhosis is chronic liver injury. Common causes are hepatitis C and long-term alcohol abuse. Tests used to diagnose cirrhosis include blood tests, imaging tests, and liver biopsy. Treatment for this condition involves treating the underlying cause. Avoid alcohol, drugs, salt,  and medicines that may damage your liver. Get help right away if you vomit bright red blood or a material that looks like coffee grounds. This information is not intended to replace advice given to you by your health care provider. Make sure you discuss any questions you have with your health care provider. Document Revised: 09/19/2020 Document Reviewed: 09/19/2020 Elsevier Patient Education  2023 [employer]  

## 2023-04-05 NOTE — Progress Notes (Signed)
Subjective:  Patient ID: Robert Church, male    DOB: [birth year]  Age: 67 y.o. MRN: [id number]  CC: Diabetes, COPD, and Congestive Heart Failure   HPI Robert Church presents for f/up ---  He returns for follow-up after recent CT raise concern for cirrhosis.  He jokingly says he expected this after 3 decades of drinking beer.  He does not want to pursue additional liver imaging.  Outpatient Medications Prior to Visit  Medication Sig Dispense Refill   albuterol (PROVENTIL) (2.5 MG/3ML) 0.083% nebulizer solution Take 3 mLs (2.5 mg total) by nebulization every 4 (four) hours as needed for wheezing or shortness of breath. 75 mL 11   albuterol (VENTOLIN HFA) 108 (90 Base) MCG/ACT inhaler INHALE 2 PUFFS INTO THE LUNGS EVERY 6 HOURS AS NEEDED FOR WHEEZING 18 g 4   allopurinol (ZYLOPRIM) 300 MG tablet Take 1 tablet (300 mg total) by mouth daily. 90 tablet 1   AMBULATORY NON FORMULARY MEDICATION Medication Name: Nitroglycerin gel 0.125% apply 2-3 times daily to the anal canal. (Patient taking differently: Place 1 Application rectally daily as needed (Fissure). Medication Name: Nitroglycerin gel 0.125% apply 2-3 times daily to the anal canal.) 30 g 1   Aspirin-Caffeine (BC FAST PAIN RELIEF PO) Take 1 packet by mouth daily.     cholecalciferol (VITAMIN D3) 25 MCG (1000 UNIT) tablet Take 1,000 Units by mouth daily.     colchicine 0.6 MG tablet Take 0.6 mg by mouth daily as needed (gout pain).     DALIRESP 500 MCG TABS tablet TAKE 1 TABLET(500 MCG) BY MOUTH DAILY 30 tablet 2   diclofenac Sodium (VOLTAREN) 1 % GEL Apply 1 Application topically daily at 12 noon.     EPINEPHrine 0.3 mg/0.3 mL IJ SOAJ injection Inject 0.3 mg into the muscle as needed for anaphylaxis. 1 each 0   fluticasone (FLONASE) 50 MCG/ACT nasal spray SHAKE LIQUID AND USE 2 SPRAYS IN EACH NOSTRIL DAILY (Patient taking differently: Place 1 spray into both nostrils daily as needed for allergies.) 16 g 2   guaiFENesin (MUCINEX) 600 MG 12 hr  tablet Take 1 tablet (600 mg total) by mouth 2 (two) times daily. (Patient taking differently: Take 600 mg by mouth 2 (two) times daily as needed for cough or to loosen phlegm.) 60 tablet 5   icosapent Ethyl (VASCEPA) 1 g capsule Take 2 capsules (2 g total) by mouth 2 (two) times daily. 360 capsule 1   ipratropium (ATROVENT) 0.06 % nasal spray Place 2 sprays into both nostrils 4 (four) times daily as needed for rhinitis. 45 mL 3   lidocaine (XYLOCAINE) 5 % ointment Apply topically 3 (three) times daily as needed. (Patient taking differently: Apply 1 Application topically daily as needed (Fissure).) 35.44 g 1   Lidocaine, Anorectal, 5 % CREA Apply 1 application topically 2 (two) times daily. (Patient taking differently: Apply 1 application  topically daily as needed (Fissure).) 30 g 1   loratadine (CLARITIN) 10 MG tablet Take 10 mg by mouth daily.     losartan (COZAAR) 25 MG tablet Take 1 tablet (25 mg total) by mouth daily. 90 tablet 3   metolazone (ZAROXOLYN) 2.5 MG tablet Take 1 tablet (2.5 mg total) by mouth as directed. (Patient taking differently: Take 2.5 mg by mouth daily as needed (swelling).) 5 tablet 1   mometasone-formoterol (DULERA) 100-5 MCG/ACT AERO Inhale 2 puffs into the lungs 2 (two) times daily. 13 g 5   nicotine (NICODERM CQ - DOSED IN MG/24 HOURS) 14  mg/24hr patch Place 1 patch (14 mg total) onto the skin daily. 28 patch 0   omeprazole (PRILOSEC) 20 MG capsule TAKE 1 CAPSULE(20 MG) BY MOUTH TWICE DAILY BEFORE A MEAL FOR ACID REFLUX 180 capsule 0   potassium chloride SA (KLOR-CON M20) 20 MEQ tablet Take 1 tablet (20 mEq total) by mouth daily. Take an extra 40 meq when you take metolazone 90 tablet 3   pravastatin (PRAVACHOL) 10 MG tablet Take 1 tablet (10 mg total) by mouth daily. 90 tablet 1   predniSONE (DELTASONE) 10 MG tablet TAKE 1 TABLET BY MOUTH EVERY DAY WITH BREAKFAST 30 tablet 3   Semaglutide (RYBELSUS) 3 MG TABS Take 1 tablet (3 mg total) by mouth daily. 30 tablet 0    Testosterone 20.25 MG/1.25GM (1.62%) GEL Place 1.25 g onto the skin daily. (Patient taking differently: Place 1 Pump onto the skin every other day.) 112.5 g 0   Tiotropium Bromide Monohydrate (SPIRIVA RESPIMAT) 2.5 MCG/ACT AERS Inhale 2 puffs into the lungs daily. (Patient taking differently: Inhale 1 puff into the lungs 2 (two) times daily.) 4 g 4   torsemide (DEMADEX) 20 MG tablet Take 2 tablets (40 mg total) by mouth daily. 60 tablet 11   vitamin B-12 (CYANOCOBALAMIN) 1000 MCG tablet Take 1,000 mcg by mouth daily.     ALPRAZolam (XANAX) 0.5 MG tablet TAKE 1 TABLET(0.5 MG) BY MOUTH THREE TIMES DAILY AS NEEDED FOR ANXIETY (Patient taking differently: Take 0.5 mg by mouth 3 (three) times daily.) 270 tablet 0   montelukast (SINGULAIR) 10 MG tablet TAKE 1 TABLET BY MOUTH EVERYDAY AT BEDTIME 30 tablet 2   Facility-Administered Medications Prior to Visit  Medication Dose Route Frequency Provider Last Rate Last Admin   methylPREDNISolone acetate (DEPO-MEDROL) injection 80 mg  80 mg Other Once Tyrell Antonio, MD        ROS Review of Systems  Constitutional: Negative.  Negative for chills, diaphoresis, fatigue and fever.  HENT: Negative.    Eyes: Negative.   Respiratory:  Positive for shortness of breath. Negative for cough, chest tightness and wheezing.   Cardiovascular:  Negative for chest pain, palpitations and leg swelling.  Gastrointestinal:  Negative for abdominal pain, constipation, diarrhea, nausea and vomiting.  Genitourinary: Negative.   Musculoskeletal:  Positive for arthralgias and back pain. Negative for myalgias.  Skin: Negative.   Neurological:  Negative for dizziness and weakness.  Hematological:  Negative for adenopathy. Does not bruise/bleed easily.  Psychiatric/Behavioral:  Positive for sleep disturbance. Negative for confusion, decreased concentration and dysphoric mood. The patient is nervous/anxious.     Objective:  BP 124/68 (BP Location: Left Arm, Patient Position:  Sitting, Cuff Size: Large)   Pulse 88   Temp 97.7 F (36.5 C) (Oral)   Resp 16   Ht 5\' 10"  (1.778 m)   Wt 229 lb (103.9 kg)   SpO2 93%   BMI 32.86 kg/m   BP Readings from Last 3 Encounters:  04/05/23 124/68  03/29/23 113/74  03/25/23 122/80    Wt Readings from Last 3 Encounters:  04/05/23 229 lb (103.9 kg)  03/25/23 230 lb 12.8 oz (104.7 kg)  03/17/23 232 lb (105.2 kg)    Physical Exam Vitals reviewed.  Constitutional:      Appearance: He is not ill-appearing.  HENT:     Mouth/Throat:     Mouth: Mucous membranes are moist.  Eyes:     General: No scleral icterus.    Conjunctiva/sclera: Conjunctivae normal.  Cardiovascular:     Rate  and Rhythm: Normal rate and regular rhythm.     Heart sounds: No murmur heard.    No gallop.  Pulmonary:     Effort: Pulmonary effort is normal.     Breath sounds: No stridor. No wheezing, rhonchi or rales.  Abdominal:     General: Abdomen is protuberant. Bowel sounds are normal. There is no distension.     Palpations: Abdomen is soft. There is no hepatomegaly, splenomegaly or mass.     Tenderness: There is no abdominal tenderness. There is no guarding.  Musculoskeletal:        General: Normal range of motion.     Cervical back: Neck supple.     Right lower leg: No edema.     Left lower leg: No edema.  Lymphadenopathy:     Cervical: No cervical adenopathy.  Skin:    General: Skin is warm and dry.  Neurological:     General: No focal deficit present.     Mental Status: He is alert. Mental status is at baseline.  Psychiatric:        Mood and Affect: Mood normal.        Behavior: Behavior normal.        Thought Content: Thought content normal.        Judgment: Judgment normal.     Lab Results  Component Value Date   WBC 8.4 02/26/2023   HGB 14.6 03/09/2023   HCT 43.0 03/09/2023   PLT 216 02/26/2023   GLUCOSE 124 (H) 04/05/2023   CHOL 221 (H) 03/17/2023   TRIG 196.0 (H) 03/17/2023   HDL 45.50 03/17/2023   LDLDIRECT 157.0  09/15/2022   LDLCALC 136 (H) 03/17/2023   ALT 20 04/05/2023   AST 20 04/05/2023   NA 139 04/05/2023   K 3.7 04/05/2023   CL 97 04/05/2023   CREATININE 0.81 04/05/2023   BUN 21 04/05/2023   CO2 31 04/05/2023   TSH 0.39 03/17/2023   PSA 3.24 06/29/2022   INR 1.0 04/05/2023   HGBA1C 6.5 03/17/2023   MICROALBUR <0.7 09/15/2022    CT CHEST LUNG CA SCREEN LOW DOSE W/O CM  Result Date: 03/18/2023 CLINICAL DATA:  32 pack-year smoking history/current smoker EXAM: CT CHEST WITHOUT CONTRAST LOW-DOSE FOR LUNG CANCER SCREENING TECHNIQUE: Multidetector CT imaging of the chest was performed following the standard protocol without IV contrast. RADIATION DOSE REDUCTION: This exam was performed according to the departmental dose-optimization program which includes automated exposure control, adjustment of the mA and/or kV according to patient size and/or use of iterative reconstruction technique. COMPARISON:  03/16/2022 FINDINGS: Cardiovascular: Aortic atherosclerosis. Tortuous thoracic aorta. Mild cardiomegaly with lipomatous hypertrophy of the intra-atrial septum. Dual lead pacer. Lad coronary artery calcification. Pulmonary artery enlargement, outflow tract 3.3 cm. Mediastinum/Nodes: No mediastinal or hilar adenopathy, given limitations of unenhanced CT. Lungs/Pleura: No pleural fluid. Moderate centrilobular emphysema. Smoking related respiratory bronchiolitis. New minimal subsegmental atelectasis in the dependent left upper lobe. Pulmonary nodules of maximally volume derived equivalent diameter 3.9 mm, similar. Upper Abdomen: Caudate lobe enlargement with subtle irregular hepatic capsule. Normal imaged portions of the spleen, stomach, pancreas, gallbladder, adrenal glands, left kidney. Musculoskeletal: Mild left-sided gynecomastia. Minimal convex right thoracic spine curvature. IMPRESSION: 1. Lung-RADS 2, benign appearance or behavior. Continue annual screening with low-dose chest CT without contrast in 12  months. 2. Pulmonary artery enlargement suggests pulmonary arterial hypertension. 3. Hepatic morphology which is suspicious for cirrhosis, especially given the EMR history of 28 alcoholic drinks per week. 4. Aortic Atherosclerosis (ICD10-I70.0) and  Emphysema (ICD10-J43.9). Coronary artery atherosclerosis. Electronically Signed   By: Jeronimo Greaves M.D.   On: 03/18/2023 12:22   Assessment & Plan:   Alcoholic cirrhosis of liver without ascites- His MELD score is reassuring at 6. -     Protime-INR; Future -     Hepatic function panel; Future  Primary hypertension- His blood pressure is well-controlled. -     Basic metabolic panel; Future -     Hepatic function panel; Future  GAD (generalized anxiety disorder) -     ALPRAZolam; Take 1 tablet (0.5 mg total) by mouth 3 (three) times daily.  Dispense: 270 tablet; Refill: 0  Atherosclerosis of aorta- Risk factor complications addressed.  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     Follow-up: Return in about 4 months (around 08/05/2023).  Sanda Linger, MD

## 2023-04-09 ENCOUNTER — Other Ambulatory Visit: Payer: Self-pay | Admitting: Emergency Medicine

## 2023-04-12 ENCOUNTER — Telehealth: Payer: Self-pay | Admitting: Emergency Medicine

## 2023-04-12 DIAGNOSIS — J449 Chronic obstructive pulmonary disease, unspecified: Secondary | ICD-10-CM

## 2023-04-12 DIAGNOSIS — J9611 Chronic respiratory failure with hypoxia: Secondary | ICD-10-CM

## 2023-04-12 NOTE — Telephone Encounter (Signed)
We can order an ONO on 3L/min and see if this is adequate

## 2023-04-12 NOTE — Telephone Encounter (Signed)
Pt called the office stating that he wonders if he is using too much oxygen at night. States after 4 hours of sleep, he will wake up feeling fair but then after going back to sleep, when he wakes up again he will get to where he feels like he can hardly get going.  Pt said he uses 4L O2 at night and wonders if that might now be too much.  Pt said he used to be on 6L but then [employer], NP lowered this down to 3L but then pt went back up to 4L.  Dr. Delton Coombes, please advise on this for pt if any testing might need to be done to determine if pt can have the O2 lowered to 3L.

## 2023-04-13 ENCOUNTER — Telehealth: Payer: Self-pay | Admitting: Physical Medicine and Rehabilitation

## 2023-04-13 ENCOUNTER — Other Ambulatory Visit: Payer: Self-pay | Admitting: Physical Medicine and Rehabilitation

## 2023-04-13 DIAGNOSIS — M5416 Radiculopathy, lumbar region: Secondary | ICD-10-CM

## 2023-04-13 NOTE — Telephone Encounter (Signed)
Called and spoke with pt letting him know the info per RB and he verbalized understanding. Order placed. Nothing further needed.

## 2023-04-13 NOTE — Telephone Encounter (Signed)
I left voicemail for patient advising. 

## 2023-04-13 NOTE — Telephone Encounter (Signed)
Pt called requesting to set another appt for back injection asap. Please call pt at [phone].

## 2023-04-16 ENCOUNTER — Telehealth: Payer: Self-pay | Admitting: Physical Medicine and Rehabilitation

## 2023-04-16 NOTE — Telephone Encounter (Signed)
Spoke with patient and scheduled injection for 04/27/23. Patient aware driver needed

## 2023-04-16 NOTE — Progress Notes (Signed)
KANON NOVOSEL - 67 y.o. male MRN [id number]  Date of birth: 1956/04/22  Office Visit Note: Visit Date: 03/29/2023 PCP: [name], MD Referred by: [name], MD  Subjective: Chief Complaint  Patient presents with   Lower Back - Pain   HPI:  BURNICE OESTREICHER is a 67 y.o. male who comes in today at the request of Ellin Goodie, FNP and Dr. Norlene Campbell for planned Left L4-5 Lumbar Interlaminar epidural steroid injection with fluoroscopic guidance.  The patient has failed conservative care including home exercise, medications, time and activity modification.  This injection will be diagnostic and hopefully therapeutic.  Please see requesting physician notes for further details and justification.   ROS Otherwise per HPI.  Assessment & Plan: Visit Diagnoses:    ICD-10-CM   1. Lumbar radiculopathy  M54.16 XR C-ARM NO REPORT    Epidural Steroid injection    methylPREDNISolone acetate (DEPO-MEDROL) injection 80 mg      Plan: No additional findings.   Meds & Orders:  Meds ordered this encounter  Medications   methylPREDNISolone acetate (DEPO-MEDROL) injection 80 mg    Orders Placed This Encounter  Procedures   XR C-ARM NO REPORT   Epidural Steroid injection    Follow-up: Return for visit to requesting provider as needed.   Procedures: No procedures performed  Lumbar Epidural Steroid Injection - Interlaminar Approach with Fluoroscopic Guidance  Patient: ONOFRIO KLEMP      Date of Birth: January 02, 1956 MRN: [id number] PCP: [name], MD      Visit Date: 03/29/2023   Universal Protocol:     Consent Given By: the patient  Position: PRONE  Additional Comments: Vital signs were monitored before and after the procedure. Patient was prepped and draped in the usual sterile fashion. The correct patient, procedure, and site was verified.   Injection Procedure Details:   Procedure diagnoses: Lumbar radiculopathy [M54.16]   Meds Administered:  Meds ordered this  encounter  Medications   methylPREDNISolone acetate (DEPO-MEDROL) injection 80 mg     Laterality: Left  Location/Site:  L4-5  Needle: 4.5 in., 20 ga. Tuohy  Needle Placement: Paramedian epidural  Findings:   -Comments: Excellent flow of contrast into the epidural space.  Procedure Details: Using a paramedian approach from the side mentioned above, the region overlying the inferior lamina was localized under fluoroscopic visualization and the soft tissues overlying this structure were infiltrated with 4 ml. of 1% Lidocaine without Epinephrine. The Tuohy needle was inserted into the epidural space using a paramedian approach.   The epidural space was localized using loss of resistance along with counter oblique bi-planar fluoroscopic views.  After negative aspirate for air, blood, and CSF, a 2 ml. volume of Isovue-250 was injected into the epidural space and the flow of contrast was observed. Radiographs were obtained for documentation purposes.    The injectate was administered into the level noted above.   Additional Comments:  No complications occurred Dressing: 2 x 2 sterile gauze and Band-Aid    Post-procedure details: Patient was observed during the procedure. Post-procedure instructions were reviewed.  Patient left the clinic in stable condition.   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 Bilateral foraminal stenosis additionally at this level that could affect either C6 nerve.   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 No apparent compressive stenosis at this level.   Chronic facet  fusion on the right at C3-4. Right foraminal narrowing that could affect the right C4 nerve.   C4-5: Disc bulge and facet osteoarthritis worse on the right. Mild right foraminal stenosis.   C6-7: Bilateral foraminal narrowing of a moderate degree that  could possibly be symptomatic.     Electronically Signed By: Paulina Fusi M.D. On: 06/03/2021 13:46 --------------------------------- CT of Lumbar Spine IMPRESSION: No apparent compressive central canal stenosis.   Mild non-compressive disc bulges L2-3 and L3-4.   Facet osteoarthritis at L4-5 left worse than right. Left foraminal narrowing due to encroachment by bulging disc material could possibly affect the left L4 nerve.   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 No apparent compressive stenosis of the canal or lateral recesses. Mild to moderate foraminal narrowing on the right could possibly affect the exiting L5 nerve.     Electronically Signed   By: Paulina Fusi M.D.   On: 06/03/2021 13:50     Objective:  VS:  HT:    WT:   BMI:     BP:113/74  HR:(!) 108bpm  TEMP: ( )  RESP:  Physical Exam Vitals and nursing note reviewed.  Constitutional:      General: He is not in acute distress.    Appearance: Normal appearance. He is obese. He is not ill-appearing.  HENT:     Head: Normocephalic and atraumatic.     Right Ear: External ear normal.     Left Ear: External ear normal.     Nose: No congestion.  Eyes:     Extraocular Movements: Extraocular movements intact.  Cardiovascular:     Rate and Rhythm: Normal rate.     Pulses: Normal pulses.  Pulmonary:     Effort: Pulmonary effort is normal. No respiratory distress.  Abdominal:     General: There is no distension.     Palpations: Abdomen is soft.  Musculoskeletal:        General: No tenderness or signs of injury.     Cervical back: Neck supple.     Right lower leg: No edema.     Left lower leg: No edema.     Comments: Patient has good distal strength without clonus.  Skin:    Findings: No erythema or rash.  Neurological:     General: No focal deficit present.     Mental Status: He is alert and oriented to person, place, and  time.     Sensory: No sensory deficit.     Motor: No weakness or abnormal muscle tone.     Coordination: Coordination normal.  Psychiatric:        Mood and Affect: Mood normal.        Behavior: Behavior normal.      Imaging: No results found.

## 2023-04-16 NOTE — Procedures (Signed)
Lumbar Epidural Steroid Injection - Interlaminar Approach with Fluoroscopic Guidance  Patient: Robert Church      Date of Birth: [birth year] MRN: [id number] PCP: [name], MD      Visit Date: 03/29/2023   Universal Protocol:     Consent Given By: the patient  Position: PRONE  Additional Comments: Vital signs were monitored before and after the procedure. Patient was prepped and draped in the usual sterile fashion. The correct patient, procedure, and site was verified.   Injection Procedure Details:   Procedure diagnoses: Lumbar radiculopathy [M54.16]   Meds Administered:  Meds ordered this encounter  Medications   methylPREDNISolone acetate (DEPO-MEDROL) injection 80 mg     Laterality: Left  Location/Site:  L4-5  Needle: 4.5 in., 20 ga. Tuohy  Needle Placement: Paramedian epidural  Findings:   -Comments: Excellent flow of contrast into the epidural space.  Procedure Details: Using a paramedian approach from the side mentioned above, the region overlying the inferior lamina was localized under fluoroscopic visualization and the soft tissues overlying this structure were infiltrated with 4 ml. of 1% Lidocaine without Epinephrine. The Tuohy needle was inserted into the epidural space using a paramedian approach.   The epidural space was localized using loss of resistance along with counter oblique bi-planar fluoroscopic views.  After negative aspirate for air, blood, and CSF, a 2 ml. volume of Isovue-250 was injected into the epidural space and the flow of contrast was observed. Radiographs were obtained for documentation purposes.    The injectate was administered into the level noted above.   Additional Comments:  No complications occurred Dressing: 2 x 2 sterile gauze and Band-Aid    Post-procedure details: Patient was observed during the procedure. Post-procedure instructions were reviewed.  Patient left the clinic in stable condition.

## 2023-04-16 NOTE — Telephone Encounter (Signed)
Patient called. Returning a call to Brittney.   

## 2023-04-26 ENCOUNTER — Telehealth (HOSPITAL_COMMUNITY): Payer: Self-pay

## 2023-04-26 ENCOUNTER — Other Ambulatory Visit: Payer: Self-pay

## 2023-04-26 ENCOUNTER — Telehealth: Payer: Self-pay | Admitting: Physical Medicine and Rehabilitation

## 2023-04-26 ENCOUNTER — Emergency Department (HOSPITAL_BASED_OUTPATIENT_CLINIC_OR_DEPARTMENT_OTHER): Payer: Medicare Other

## 2023-04-26 ENCOUNTER — Encounter (HOSPITAL_BASED_OUTPATIENT_CLINIC_OR_DEPARTMENT_OTHER): Payer: Self-pay | Admitting: Emergency Medicine

## 2023-04-26 ENCOUNTER — Ambulatory Visit: Payer: Medicare Other | Admitting: Physical Medicine and Rehabilitation

## 2023-04-26 ENCOUNTER — Emergency Department (HOSPITAL_BASED_OUTPATIENT_CLINIC_OR_DEPARTMENT_OTHER)
Admission: EM | Admit: 2023-04-26 | Discharge: 2023-04-26 | Disposition: A | Discharge status: Home / Self Care | Payer: Medicare Other | Attending: Emergency Medicine | Admitting: Emergency Medicine

## 2023-04-26 DIAGNOSIS — L03116 Cellulitis of left lower limb: Secondary | ICD-10-CM

## 2023-04-26 DIAGNOSIS — E119 Type 2 diabetes mellitus without complications: Secondary | ICD-10-CM | POA: Insufficient documentation

## 2023-04-26 DIAGNOSIS — Z79899 Other long term (current) drug therapy: Secondary | ICD-10-CM | POA: Insufficient documentation

## 2023-04-26 DIAGNOSIS — I509 Heart failure, unspecified: Secondary | ICD-10-CM | POA: Insufficient documentation

## 2023-04-26 DIAGNOSIS — M7989 Other specified soft tissue disorders: Secondary | ICD-10-CM | POA: Diagnosis not present

## 2023-04-26 DIAGNOSIS — I110 Hypertensive heart disease with heart failure: Secondary | ICD-10-CM | POA: Insufficient documentation

## 2023-04-26 DIAGNOSIS — R600 Localized edema: Secondary | ICD-10-CM | POA: Diagnosis not present

## 2023-04-26 LAB — BASIC METABOLIC PANEL
Anion gap: 10 (ref 5–15)
BUN: 14 mg/dL (ref 8–23)
CO2: 28 mmol/L (ref 22–32)
Calcium: 9.1 mg/dL (ref 8.9–10.3)
Chloride: 102 mmol/L (ref 98–111)
Creatinine, Ser: 0.82 mg/dL (ref 0.61–1.24)
GFR, Estimated: >60 mL/min (ref >60)
Glucose, Bld: 175 mg/dL — ABNORMAL HIGH (ref 70–99)
Potassium: 4.6 mmol/L (ref 3.5–5.1)
Sodium: 140 mmol/L (ref 135–145)

## 2023-04-26 LAB — CBC WITH DIFFERENTIAL/PLATELET
Abs Immature Granulocytes: 0.03 10*3/uL (ref 0.00–0.07)
Basophils Absolute: 0 10*3/uL (ref 0.0–0.1)
Basophils Relative: 0 %
Eosinophils Absolute: 0 10*3/uL (ref 0.0–0.5)
Eosinophils Relative: 0 %
HCT: 45.3 % (ref 39.0–52.0)
Hemoglobin: 15.1 g/dL (ref 13.0–17.0)
Immature Granulocytes: 0 %
Lymphocytes Relative: 7 %
Lymphs Abs: 0.5 10*3/uL — ABNORMAL LOW (ref 0.7–4.0)
MCH: 32 pg (ref 26.0–34.0)
MCHC: 33.3 g/dL (ref 30.0–36.0)
MCV: 96 fL (ref 80.0–100.0)
Monocytes Absolute: 0.3 10*3/uL (ref 0.1–1.0)
Monocytes Relative: 4 %
Neutro Abs: 6.1 10*3/uL (ref 1.7–7.7)
Neutrophils Relative %: 89 %
Platelets: 179 10*3/uL (ref 150–400)
RBC: 4.72 MIL/uL (ref 4.22–5.81)
RDW: 13.6 % (ref 11.5–15.5)
WBC: 7 10*3/uL (ref 4.0–10.5)
nRBC: 0 % (ref 0.0–0.2)

## 2023-04-26 MED ORDER — SULFAMETHOXAZOLE-TRIMETHOPRIM 800-160 MG PO TABS: 1.0000 | ORAL_TABLET | Freq: Two times a day (BID) | ORAL | 0 refills | Status: DC

## 2023-04-26 NOTE — ED Provider Notes (Signed)
[place] EMERGENCY DEPARTMENT AT [facility] Provider Note   CSN: [id number] Arrival date & time: 04/26/23  1440     History  Chief Complaint  Patient presents with   Leg Swelling    Robert Church is a 67 y.o. male history of nonischemic cardiomyopathy, CHF, hypertension, GERD, type 2 diabetes, carotid artery calcification 5 days of left leg swelling.  Patient states this is atraumatic and nonpainful unless his left leg is palpated.  Patient states he can walk without difficulty.  Patient denies any recent travel, hospitalizations, surgeries, hemoptysis, personal history of cancer but does note that he has previous history of DVTs and was last on Eliquis back in February.  Patient states he only significant medical history that has occurred in the last few weeks is that 2 weeks ago he had an epidural injection in his back.  Patient denies chest pain, shortness of breath, abdominal pain, nausea/vomiting, back pain, change in sensation/motor skills, fevers, recent operations, recent fractures  Home Medications Prior to Admission medications   Medication Sig Start Date End Date Taking? Authorizing Provider  sulfamethoxazole-trimethoprim (BACTRIM DS) 800-160 MG tablet Take 1 tablet by mouth 2 (two) times daily for 7 days. 04/26/23 05/03/23 Yes Hilary Pundt, Beverly Gust, PA-C  albuterol (PROVENTIL) (2.5 MG/3ML) 0.083% nebulizer solution Take 3 mLs (2.5 mg total) by nebulization every 4 (four) hours as needed for wheezing or shortness of breath. 11/26/22   Leslye Peer, MD  albuterol (VENTOLIN HFA) 108 (90 Base) MCG/ACT inhaler INHALE 2 PUFFS INTO THE LUNGS EVERY 6 HOURS AS NEEDED FOR WHEEZING 02/09/23   Leslye Peer, MD  allopurinol (ZYLOPRIM) 300 MG tablet Take 1 tablet (300 mg total) by mouth daily. 03/18/23   [name], MD  ALPRAZolam Prudy Feeler) 0.5 MG tablet Take 1 tablet (0.5 mg total) by mouth 3 (three) times daily. 04/05/23   [name], MD  AMBULATORY NON FORMULARY MEDICATION  Medication Name: Nitroglycerin gel 0.125% apply 2-3 times daily to the anal canal. Patient taking differently: Place 1 Application rectally daily as needed (Fissure). Medication Name: Nitroglycerin gel 0.125% apply 2-3 times daily to the anal canal. 01/08/22   Zehr, Shanda Bumps D, PA-C  Aspirin-Caffeine (BC FAST PAIN RELIEF PO) Take 1 packet by mouth daily.    [provider]  cholecalciferol (VITAMIN D3) 25 MCG (1000 UNIT) tablet Take 1,000 Units by mouth daily.    [provider]  colchicine 0.6 MG tablet Take 0.6 mg by mouth daily as needed (gout pain).    [provider]  DALIRESP 500 MCG TABS tablet TAKE 1 TABLET(500 MCG) BY MOUTH DAILY 03/21/23   Leslye Peer, MD  diclofenac Sodium (VOLTAREN) 1 % GEL Apply 1 Application topically daily at 12 noon.    [provider]  EPINEPHrine 0.3 mg/0.3 mL IJ SOAJ injection Inject 0.3 mg into the muscle as needed for anaphylaxis. 07/13/22   [name], MD  fluticasone (FLONASE) 50 MCG/ACT nasal spray SHAKE LIQUID AND USE 2 SPRAYS IN EACH NOSTRIL DAILY Patient taking differently: Place 1 spray into both nostrils daily as needed for allergies. 06/29/22   Leslye Peer, MD  guaiFENesin (MUCINEX) 600 MG 12 hr tablet Take 1 tablet (600 mg total) by mouth 2 (two) times daily. Patient taking differently: Take 600 mg by mouth 2 (two) times daily as needed for cough or to loosen phlegm. 12/17/21   Leslye Peer, MD  icosapent Ethyl (VASCEPA) 1 g capsule Take 2 capsules (2 g total)  by mouth 2 (two) times daily. 03/19/23   [name], MD  ipratropium (ATROVENT) 0.06 % nasal spray Place 2 sprays into both nostrils 4 (four) times daily as needed for rhinitis. 03/17/23   Leslye Peer, MD  lidocaine (XYLOCAINE) 5 % ointment Apply topically 3 (three) times daily as needed. Patient taking differently: Apply 1 Application topically daily as needed (Fissure). 09/15/22   [name], MD  Lidocaine, Anorectal, 5 % CREA Apply 1  application topically 2 (two) times daily. Patient taking differently: Apply 1 application  topically daily as needed (Fissure). 01/08/22   Zehr, Princella Pellegrini, PA-C  loratadine (CLARITIN) 10 MG tablet Take 10 mg by mouth daily.    [provider]  losartan (COZAAR) 25 MG tablet Take 1 tablet (25 mg total) by mouth daily. 01/13/23   Bensimhon, Bevelyn Buckles, MD  metolazone (ZAROXOLYN) 2.5 MG tablet Take 1 tablet (2.5 mg total) by mouth as directed. Patient taking differently: Take 2.5 mg by mouth daily as needed (swelling). 01/13/23   Bensimhon, Bevelyn Buckles, MD  mometasone-formoterol (DULERA) 100-5 MCG/ACT AERO Inhale 2 puffs into the lungs 2 (two) times daily. 12/10/22   Leslye Peer, MD  montelukast (SINGULAIR) 10 MG tablet TAKE 1 TABLET BY MOUTH EVERYDAY AT BEDTIME 04/09/23   Leslye Peer, MD  nicotine (NICODERM CQ - DOSED IN MG/24 HOURS) 14 mg/24hr patch Place 1 patch (14 mg total) onto the skin daily. 05/22/22   Leslye Peer, MD  omeprazole (PRILOSEC) 20 MG capsule TAKE 1 CAPSULE(20 MG) BY MOUTH TWICE DAILY BEFORE A MEAL FOR ACID REFLUX 12/25/22   [name], MD  potassium chloride SA (KLOR-CON M20) 20 MEQ tablet Take 1 tablet (20 mEq total) by mouth daily. Take an extra 40 meq when you take metolazone 01/18/23   Bensimhon, Bevelyn Buckles, MD  pravastatin (PRAVACHOL) 10 MG tablet Take 1 tablet (10 mg total) by mouth daily. 03/19/23   [name], MD  predniSONE (DELTASONE) 10 MG tablet TAKE 1 TABLET BY MOUTH EVERY DAY WITH BREAKFAST 01/13/23   Leslye Peer, MD  Semaglutide (RYBELSUS) 3 MG TABS Take 1 tablet (3 mg total) by mouth daily. 03/17/23   [name], MD  Testosterone 20.25 MG/1.25GM (1.62%) GEL Place 1.25 g onto the skin daily. Patient taking differently: Place 1 Pump onto the skin every other day. 06/29/22   [name], MD  Tiotropium Bromide Monohydrate (SPIRIVA RESPIMAT) 2.5 MCG/ACT AERS Inhale 2 puffs into the lungs daily. Patient taking differently: Inhale 1 puff into the  lungs 2 (two) times daily. 12/10/22   Leslye Peer, MD  torsemide (DEMADEX) 20 MG tablet Take 2 tablets (40 mg total) by mouth daily. 01/13/23   Bensimhon, Bevelyn Buckles, MD  vitamin B-12 (CYANOCOBALAMIN) 1000 MCG tablet Take 1,000 mcg by mouth daily.    [provider]      Allergies    Bee venom, Spironolactone, Daliresp [roflumilast], Jardiance [empagliflozin], Penicillins, Clindamycin, Doxycycline, E-mycin [erythromycin base], and Rosuvastatin    Review of Systems   Review of Systems  Physical Exam Updated Vital Signs BP (!) 146/90 (BP Location: Left Arm)   Pulse 88   Temp (!) 97.5 F (36.4 C) (Oral)   Resp 18   Ht 5\' 10"  (1.778 m)   Wt 105.2 kg   SpO2 94%   BMI 33.29 kg/m  Physical Exam Constitutional:      General: He is not in acute distress. Cardiovascular:     Rate  and Rhythm: Normal rate and regular rhythm.     Pulses: Normal pulses.     Comments: 2+ bilateral dorsalis pedal pulses with regular rate Pulmonary:     Effort: Pulmonary effort is normal. No respiratory distress.     Breath sounds: Normal breath sounds.  Abdominal:     Palpations: Abdomen is soft.     Tenderness: There is no abdominal tenderness. There is no guarding or rebound.  Musculoskeletal:     Comments: Able to fully range left lower limb and knee, ankle, toes without difficulty 2+ nonpitting edema with overlying erythema noted along lower leg, nontender to palpation Soft compartments No step-off/crepitus palpated  Skin:    General: Skin is warm and dry.     Capillary Refill: Capillary refill takes less than 2 seconds.     Findings: Erythema present.     Comments: No varicose veins noted No area of fluctuance noted  Neurological:     Mental Status: He is alert.     Comments: Sensation intact distally  Psychiatric:        Mood and Affect: Mood normal.     ED Results / Procedures / Treatments   Labs (all labs ordered are listed, but only abnormal results are displayed) Labs  Reviewed  BASIC METABOLIC PANEL - Abnormal; Notable for the following components:      Result Value   Glucose, Bld 175 (*)    All other components within normal limits  CBC WITH DIFFERENTIAL/PLATELET - Abnormal; Notable for the following components:   Lymphs Abs 0.5 (*)    All other components within normal limits    EKG None  Radiology US Venous Img Lower  Left (DVT Study)  Result Date: 04/26/2023 CLINICAL DATA:  Left lower extremity pain and edema for the past 5 days. History of previous DVT. Evaluate for acute or chronic DVT. EXAM: LEFT LOWER EXTREMITY VENOUS DOPPLER ULTRASOUND TECHNIQUE: Gray-scale sonography with graded compression, as well as color Doppler and duplex ultrasound were performed to evaluate the lower extremity deep venous systems from the level of the common femoral vein and including the common femoral, femoral, profunda femoral, popliteal and calf veins including the posterior tibial, peroneal and gastrocnemius veins when visible. The superficial great saphenous vein was also interrogated. Spectral Doppler was utilized to evaluate flow at rest and with distal augmentation maneuvers in the common femoral, femoral and popliteal veins. COMPARISON:  None Available. FINDINGS: Contralateral Common Femoral Vein: Respiratory phasicity is normal and symmetric with the symptomatic side. No evidence of thrombus. Normal compressibility. Common Femoral Vein: No evidence of thrombus. Normal compressibility, respiratory phasicity and response to augmentation. Saphenofemoral Junction: No evidence of thrombus. Normal compressibility and flow on color Doppler imaging. Profunda Femoral Vein: No evidence of thrombus. Normal compressibility and flow on color Doppler imaging. Femoral Vein: No evidence of thrombus. Normal compressibility, respiratory phasicity and response to augmentation. Popliteal Vein: No evidence of thrombus. Normal compressibility, respiratory phasicity and response to  augmentation. Calf Veins: No evidence of thrombus. Normal compressibility and flow on color Doppler imaging. Superficial Great Saphenous Vein: No evidence of thrombus. Normal compressibility. Other Findings: There is a minimal amount of subcutaneous edema at the level of the left ankle. IMPRESSION: No evidence of acute or chronic DVT within the left lower extremity. Electronically Signed   By: Simonne Come M.D.   On: 04/26/2023 16:18    Procedures Procedures    Medications Ordered in ED Medications - No data to display  ED Course/ Medical Decision  Making/ A&P                             Medical Decision Making Amount and/or Complexity of Data Reviewed Labs: ordered.   [name] 67 y.o. presented today for left leg edema. Working DDx that I considered at this time includes, but not limited to, DVT, cellulitis, varicose veins, venous insufficiency, compartment syndrome.  R/o DDx: DVT, varicose veins, venous insufficiency, compartment syndrome: These are considered less likely due to history of present illness and physical exam findings  Review of prior external notes: 04/05/2023 office visit note  Unique Tests and My Interpretation:  CBC with differential: Unremarkable BMP: Unremarkable Lower left DVT study: Unremarkable  Discussion with Independent Historian: None  Discussion of Management of Tests: None  Risk: Medium: prescription drug management  Risk Stratification Score: None  Plan: Patient presented for left leg edema. On exam patient was in no acute distress and stable vitals.  On exam patient did have nonpitting edema noted to the left lower leg with overlying erythema that was nontender to palpation.  Patient had good pulse motor sensation distally and has history of blood clots.  Patient was on Eliquis for past blood clot in his left arm but that ended in February.  Patient denies traumatic history or any recent travel/hospitalization/surgeries.  Due to patient's  history and physical exam basic labs will be ordered along with an ultrasound.  Patient stable this time.  Patient's labs and ultrasound are all reassuring.  Due to patient's complicated past medical history and physical presentation patient will be given to cover for any cellulitis.  Patient is documented allergy to penicillins, cephalosporins, doxycycline but no documented allergy to Bactrim and it seems tolerated in the past.  This was verbalized to the patient and patient verbalized agreement.  I encouraged patient to follow-up with his primary care provider.  Patient was given return precautions. Patient stable for discharge at this time.  Patient verbalized understanding of plan.         Final Clinical Impression(s) / ED Diagnoses Final diagnoses:  Cellulitis of left lower extremity    Rx / DC Orders ED Discharge Orders          Ordered    sulfamethoxazole-trimethoprim (BACTRIM DS) 800-160 MG tablet  2 times daily        04/26/23 1704              Netta Corrigan, PA-C 04/26/23 1706    Gwyneth Sprout, MD 04/27/23 1408

## 2023-04-26 NOTE — Telephone Encounter (Signed)
Spoke with patient this morning, he reports recent swelling and pain to left lower extremity and foot. History of DVT, states cardiology stopped Eliquis in February, however he took dose this morning. He is scheduled for lumbar interlaminar epidural steroid injection tomorrow 04/27/2023. We will hold on injection for now until we know more about possible DVT and blood thinners. I instructed patient to call us when he knows more.

## 2023-04-26 NOTE — Telephone Encounter (Signed)
Patient having pain in the back of his calf, started Thursday, swelling down into foot. He states no heat, but there is some redness.  He has increased to taking 2 fluid pills. No changes noted since Thursday

## 2023-04-26 NOTE — ED Notes (Signed)
Patient returned from US.

## 2023-04-26 NOTE — ED Notes (Signed)
RN provided AVS using Teachback Method. Patient verbalizes understanding of Discharge Instructions. Opportunity for Questioning and Answers were provided by RN. Patient Discharged from ED ambulatory to Home via Self.  

## 2023-04-26 NOTE — Discharge Instructions (Signed)
Please follow-up with your primary care provider regarding recent symptoms and ER visit.  Today your ultrasound and labs were all reassuring however your lower leg is swollen with redness and so to cover for any possible skin infection we have prescribed for you Bactrim.  Please take as prescribed and return to ER if symptoms are to worsen.

## 2023-04-26 NOTE — Telephone Encounter (Signed)
Update:  Patient is headed to the ER to be seen.

## 2023-04-26 NOTE — ED Triage Notes (Signed)
Pt arrives to ED with c/p left leg swelling that started x5 days ago.

## 2023-04-27 ENCOUNTER — Telehealth: Payer: Self-pay | Admitting: Physical Medicine and Rehabilitation

## 2023-04-27 ENCOUNTER — Encounter: Payer: Medicare Other | Admitting: Physical Medicine and Rehabilitation

## 2023-04-27 ENCOUNTER — Telehealth: Payer: Self-pay | Admitting: Internal Medicine

## 2023-04-27 DIAGNOSIS — J449 Chronic obstructive pulmonary disease, unspecified: Secondary | ICD-10-CM | POA: Diagnosis not present

## 2023-04-27 DIAGNOSIS — J9610 Chronic respiratory failure, unspecified whether with hypoxia or hypercapnia: Secondary | ICD-10-CM | POA: Diagnosis not present

## 2023-04-27 NOTE — Telephone Encounter (Signed)
Spoke with patient and rescheduled injection for 05/06/23. Patient aware driver needed

## 2023-04-27 NOTE — Telephone Encounter (Signed)
Patient called needing to R/s his appointment with Dr. Alvester Morin for an injection in his back. The number to contact patient is [phone]

## 2023-04-27 NOTE — Telephone Encounter (Signed)
Pt called stating that Rx sulfamethoxazole-trimethoprim (BACTRIM DS) 800-160 MG tablet when taking medication last night it burned his tongue and throat. Pt wants to know if  Dr. Yetta Barre could send over an alternative medication to take. Pt will like call back with update.

## 2023-04-28 ENCOUNTER — Ambulatory Visit (INDEPENDENT_AMBULATORY_CARE_PROVIDER_SITE_OTHER): Payer: Medicare Other | Admitting: Internal Medicine

## 2023-04-28 ENCOUNTER — Encounter: Payer: Self-pay | Admitting: Internal Medicine

## 2023-04-28 VITALS — BP 128/86 | HR 86 | Temp 98.1°F | Resp 16 | Ht 70.0 in | Wt 238.0 lb

## 2023-04-28 DIAGNOSIS — L03116 Cellulitis of left lower limb: Secondary | ICD-10-CM

## 2023-04-28 DIAGNOSIS — B370 Candidal stomatitis: Secondary | ICD-10-CM | POA: Insufficient documentation

## 2023-04-28 DIAGNOSIS — J441 Chronic obstructive pulmonary disease with (acute) exacerbation: Secondary | ICD-10-CM

## 2023-04-28 DIAGNOSIS — K7030 Alcoholic cirrhosis of liver without ascites: Secondary | ICD-10-CM | POA: Diagnosis not present

## 2023-04-28 DIAGNOSIS — L02416 Cutaneous abscess of left lower limb: Secondary | ICD-10-CM

## 2023-04-28 DIAGNOSIS — I5042 Chronic combined systolic (congestive) and diastolic (congestive) heart failure: Secondary | ICD-10-CM | POA: Diagnosis not present

## 2023-04-28 DIAGNOSIS — J449 Chronic obstructive pulmonary disease, unspecified: Secondary | ICD-10-CM | POA: Diagnosis not present

## 2023-04-28 DIAGNOSIS — K2100 Gastro-esophageal reflux disease with esophagitis, without bleeding: Secondary | ICD-10-CM

## 2023-04-28 MED ORDER — OMEPRAZOLE 20 MG PO CPDR: DELAYED_RELEASE_CAPSULE | ORAL | 0 refills | Status: DC

## 2023-04-28 MED ORDER — FLUCONAZOLE 150 MG PO TABS
300.0000 mg | ORAL_TABLET | Freq: Every day | ORAL | 0 refills | Status: AC
Start: 2023-04-28 — End: 2023-05-08

## 2023-04-28 MED ORDER — PREDNISONE 10 MG PO TABS: ORAL_TABLET | ORAL | 3 refills | Status: DC

## 2023-04-28 MED ORDER — CEFDINIR 300 MG PO CAPS
300.0000 mg | ORAL_CAPSULE | Freq: Two times a day (BID) | ORAL | 0 refills | Status: AC
Start: 2023-04-28 — End: 2023-05-05

## 2023-04-28 MED ORDER — TORSEMIDE 20 MG PO TABS: 20.0000 mg | ORAL_TABLET | Freq: Two times a day (BID) | ORAL | 0 refills | Status: DC

## 2023-04-28 MED ORDER — TORSEMIDE 20 MG PO TABS
20.0000 mg | ORAL_TABLET | Freq: Two times a day (BID) | ORAL | 0 refills | Status: DC
Start: 2023-04-28 — End: 2023-06-03

## 2023-04-28 NOTE — Progress Notes (Signed)
Subjective:  Patient ID: Robert Church, male    DOB: [birth year]  Age: 67 y.o. MRN: [id number]  CC: Gastroesophageal Reflux, Diabetes, COPD, and Congestive Heart Failure   HPI [name] presents for f/up ---  He complains of a 6-day hidden history of pain, redness, and swelling in his left lower extremity.  2 days prior to this visit an ultrasound was negative for deep venous thrombosis.  He was prescribed a sulfa drug but complains that it has caused his tongue to be painful and red.  Outpatient Medications Prior to Visit  Medication Sig Dispense Refill   albuterol (PROVENTIL) (2.5 MG/3ML) 0.083% nebulizer solution Take 3 mLs (2.5 mg total) by nebulization every 4 (four) hours as needed for wheezing or shortness of breath. 75 mL 11   albuterol (VENTOLIN HFA) 108 (90 Base) MCG/ACT inhaler INHALE 2 PUFFS INTO THE LUNGS EVERY 6 HOURS AS NEEDED FOR WHEEZING 18 g 4   allopurinol (ZYLOPRIM) 300 MG tablet Take 1 tablet (300 mg total) by mouth daily. 90 tablet 1   ALPRAZolam (XANAX) 0.5 MG tablet Take 1 tablet (0.5 mg total) by mouth 3 (three) times daily. 270 tablet 0   AMBULATORY NON FORMULARY MEDICATION Medication Name: Nitroglycerin gel 0.125% apply 2-3 times daily to the anal canal. (Patient taking differently: Place 1 Application rectally daily as needed (Fissure). Medication Name: Nitroglycerin gel 0.125% apply 2-3 times daily to the anal canal.) 30 g 1   cholecalciferol (VITAMIN D3) 25 MCG (1000 UNIT) tablet Take 1,000 Units by mouth daily.     colchicine 0.6 MG tablet Take 0.6 mg by mouth daily as needed (gout pain).     DALIRESP 500 MCG TABS tablet TAKE 1 TABLET(500 MCG) BY MOUTH DAILY 30 tablet 2   diclofenac Sodium (VOLTAREN) 1 % GEL Apply 1 Application topically daily at 12 noon.     EPINEPHrine 0.3 mg/0.3 mL IJ SOAJ injection Inject 0.3 mg into the muscle as needed for anaphylaxis. 1 each 0   fluticasone (FLONASE) 50 MCG/ACT nasal spray SHAKE LIQUID AND USE 2 SPRAYS IN EACH  NOSTRIL DAILY (Patient taking differently: Place 1 spray into both nostrils daily as needed for allergies.) 16 g 2   guaiFENesin (MUCINEX) 600 MG 12 hr tablet Take 1 tablet (600 mg total) by mouth 2 (two) times daily. (Patient taking differently: Take 600 mg by mouth 2 (two) times daily as needed for cough or to loosen phlegm.) 60 tablet 5   icosapent Ethyl (VASCEPA) 1 g capsule Take 2 capsules (2 g total) by mouth 2 (two) times daily. 360 capsule 1   ipratropium (ATROVENT) 0.06 % nasal spray Place 2 sprays into both nostrils 4 (four) times daily as needed for rhinitis. 45 mL 3   lidocaine (XYLOCAINE) 5 % ointment Apply topically 3 (three) times daily as needed. (Patient taking differently: Apply 1 Application topically daily as needed (Fissure).) 35.44 g 1   Lidocaine, Anorectal, 5 % CREA Apply 1 application topically 2 (two) times daily. (Patient taking differently: Apply 1 application  topically daily as needed (Fissure).) 30 g 1   loratadine (CLARITIN) 10 MG tablet Take 10 mg by mouth daily.     losartan (COZAAR) 25 MG tablet Take 1 tablet (25 mg total) by mouth daily. 90 tablet 3   metolazone (ZAROXOLYN) 2.5 MG tablet Take 1 tablet (2.5 mg total) by mouth as directed. (Patient taking differently: Take 2.5 mg by mouth daily as needed (swelling).) 5 tablet 1  mometasone-formoterol (DULERA) 100-5 MCG/ACT AERO Inhale 2 puffs into the lungs 2 (two) times daily. 13 g 5   montelukast (SINGULAIR) 10 MG tablet TAKE 1 TABLET BY MOUTH EVERYDAY AT BEDTIME 90 tablet 3   nicotine (NICODERM CQ - DOSED IN MG/24 HOURS) 14 mg/24hr patch Place 1 patch (14 mg total) onto the skin daily. 28 patch 0   potassium chloride SA (KLOR-CON M20) 20 MEQ tablet Take 1 tablet (20 mEq total) by mouth daily. Take an extra 40 meq when you take metolazone 90 tablet 3   pravastatin (PRAVACHOL) 10 MG tablet Take 1 tablet (10 mg total) by mouth daily. 90 tablet 1   Semaglutide (RYBELSUS) 3 MG TABS Take 1 tablet (3 mg total) by mouth  daily. 30 tablet 0   Testosterone 20.25 MG/1.25GM (1.62%) GEL Place 1.25 g onto the skin daily. (Patient taking differently: Place 1 Pump onto the skin every other day.) 112.5 g 0   Tiotropium Bromide Monohydrate (SPIRIVA RESPIMAT) 2.5 MCG/ACT AERS Inhale 2 puffs into the lungs daily. (Patient taking differently: Inhale 1 puff into the lungs 2 (two) times daily.) 4 g 4   vitamin B-12 (CYANOCOBALAMIN) 1000 MCG tablet Take 1,000 mcg by mouth daily.     Aspirin-Caffeine (BC FAST PAIN RELIEF PO) Take 1 packet by mouth daily.     omeprazole (PRILOSEC) 20 MG capsule TAKE 1 CAPSULE(20 MG) BY MOUTH TWICE DAILY BEFORE A MEAL FOR ACID REFLUX 180 capsule 0   predniSONE (DELTASONE) 10 MG tablet TAKE 1 TABLET BY MOUTH EVERY DAY WITH BREAKFAST 30 tablet 3   sulfamethoxazole-trimethoprim (BACTRIM DS) 800-160 MG tablet Take 1 tablet by mouth 2 (two) times daily for 7 days. 14 tablet 0   torsemide (DEMADEX) 20 MG tablet Take 2 tablets (40 mg total) by mouth daily. 60 tablet 11   No facility-administered medications prior to visit.    ROS Review of Systems  Constitutional:  Positive for unexpected weight change (wt gain). Negative for appetite change, chills, diaphoresis, fatigue and fever.  HENT:  Negative for facial swelling, mouth sores, sore throat, trouble swallowing and voice change.   Respiratory:  Positive for cough, shortness of breath and wheezing. Negative for chest tightness.   Cardiovascular:  Positive for leg swelling. Negative for chest pain and palpitations.  Gastrointestinal:  Negative for abdominal pain, diarrhea and nausea.  Genitourinary: Negative.  Negative for difficulty urinating.  Musculoskeletal: Negative.  Negative for joint swelling and myalgias.  Skin:  Positive for color change.  Neurological: Negative.  Negative for dizziness and weakness.  Hematological:  Negative for adenopathy. Does not bruise/bleed easily.  Psychiatric/Behavioral: Negative.      Objective:  BP 128/86 (BP  Location: Left Arm, Patient Position: Sitting, Cuff Size: Large)   Pulse 86   Temp 98.1 F (36.7 C) (Oral)   Resp 16   Ht 5\' 10"  (1.778 m)   Wt 238 lb (108 kg)   SpO2 93%   BMI 34.15 kg/m   BP Readings from Last 3 Encounters:  04/28/23 128/86  04/26/23 (!) 146/90  04/05/23 124/68    Wt Readings from Last 3 Encounters:  04/28/23 238 lb (108 kg)  04/26/23 232 lb (105.2 kg)  04/05/23 229 lb (103.9 kg)    Physical Exam Vitals reviewed.  Constitutional:      General: He is not in acute distress.    Appearance: He is obese. He is ill-appearing. He is not toxic-appearing or diaphoretic.  HENT:     Mouth/Throat:  Mouth: Mucous membranes are moist.     Palate: No mass and lesions.     Pharynx: Oropharyngeal exudate and posterior oropharyngeal erythema present.     Tonsils: No tonsillar exudate.     Comments: Tongue is red and covered with an exudate Eyes:     General: No scleral icterus. Cardiovascular:     Rate and Rhythm: Normal rate and regular rhythm.     Heart sounds: No murmur heard. Pulmonary:     Effort: Pulmonary effort is normal.     Breath sounds: Examination of the right-upper field reveals wheezing and rhonchi. Examination of the left-upper field reveals wheezing and rhonchi. Examination of the right-middle field reveals wheezing and rhonchi. Examination of the left-middle field reveals wheezing and rhonchi. Examination of the right-lower field reveals wheezing and rhonchi. Examination of the left-lower field reveals wheezing and rhonchi. Wheezing and rhonchi present. No rales.  Abdominal:     General: Abdomen is protuberant. Bowel sounds are normal. There is no distension.     Palpations: Abdomen is soft. There is no hepatomegaly, splenomegaly or mass.     Tenderness: There is no abdominal tenderness.  Musculoskeletal:        General: No swelling.     Cervical back: Neck supple.     Right lower leg: Edema present.     Left lower leg: Edema present.   Lymphadenopathy:     Cervical: No cervical adenopathy.  Skin:    General: Skin is warm.     Findings: Erythema present. No lesion or rash.  Neurological:     General: No focal deficit present.     Mental Status: He is alert. Mental status is at baseline.     Lab Results  Component Value Date   WBC 7.0 04/26/2023   HGB 15.1 04/26/2023   HCT 45.3 04/26/2023   PLT 179 04/26/2023   GLUCOSE 175 (H) 04/26/2023   CHOL 221 (H) 03/17/2023   TRIG 196.0 (H) 03/17/2023   HDL 45.50 03/17/2023   LDLDIRECT 157.0 09/15/2022   LDLCALC 136 (H) 03/17/2023   ALT 20 04/05/2023   AST 20 04/05/2023   NA 140 04/26/2023   K 4.6 04/26/2023   CL 102 04/26/2023   CREATININE 0.82 04/26/2023   BUN 14 04/26/2023   CO2 28 04/26/2023   TSH 0.39 03/17/2023   PSA 3.24 06/29/2022   INR 1.0 04/05/2023   HGBA1C 6.5 03/17/2023   MICROALBUR <0.7 09/15/2022    US Venous Img Lower  Left (DVT Study)  Result Date: 04/26/2023 CLINICAL DATA:  Left lower extremity pain and edema for the past 5 days. History of previous DVT. Evaluate for acute or chronic DVT. EXAM: LEFT LOWER EXTREMITY VENOUS DOPPLER ULTRASOUND TECHNIQUE: Gray-scale sonography with graded compression, as well as color Doppler and duplex ultrasound were performed to evaluate the lower extremity deep venous systems from the level of the common femoral vein and including the common femoral, femoral, profunda femoral, popliteal and calf veins including the posterior tibial, peroneal and gastrocnemius veins when visible. The superficial great saphenous vein was also interrogated. Spectral Doppler was utilized to evaluate flow at rest and with distal augmentation maneuvers in the common femoral, femoral and popliteal veins. COMPARISON:  None Available. FINDINGS: Contralateral Common Femoral Vein: Respiratory phasicity is normal and symmetric with the symptomatic side. No evidence of thrombus. Normal compressibility. Common Femoral Vein: No evidence of thrombus.  Normal compressibility, respiratory phasicity and response to augmentation. Saphenofemoral Junction: No evidence of thrombus. Normal compressibility and  flow on color Doppler imaging. Profunda Femoral Vein: No evidence of thrombus. Normal compressibility and flow on color Doppler imaging. Femoral Vein: No evidence of thrombus. Normal compressibility, respiratory phasicity and response to augmentation. Popliteal Vein: No evidence of thrombus. Normal compressibility, respiratory phasicity and response to augmentation. Calf Veins: No evidence of thrombus. Normal compressibility and flow on color Doppler imaging. Superficial Great Saphenous Vein: No evidence of thrombus. Normal compressibility. Other Findings: There is a minimal amount of subcutaneous edema at the level of the left ankle. IMPRESSION: No evidence of acute or chronic DVT within the left lower extremity. Electronically Signed   By: Simonne Come M.D.   On: 04/26/2023 16:18    Assessment & Plan:   Chronic obstructive pulmonary disease with acute exacerbation (HCC) -     predniSONE; TAKE 1 TABLET BY MOUTH EVERY DAY WITH BREAKFAST  Dispense: 30 tablet; Refill: 3  Gastroesophageal reflux disease with esophagitis without hemorrhage -     Omeprazole; TAKE 1 CAPSULE(20 MG) BY MOUTH TWICE DAILY BEFORE A MEAL FOR ACID REFLUX  Dispense: 180 capsule; Refill: 0  Alcoholic cirrhosis of liver without ascites (HCC) -     Torsemide; Take 1 tablet (20 mg total) by mouth 2 (two) times daily.  Dispense: 180 tablet; Refill: 0  Chronic combined systolic and diastolic CHF (congestive heart failure) (HCC) -     Torsemide; Take 1 tablet (20 mg total) by mouth 2 (two) times daily.  Dispense: 180 tablet; Refill: 0  Cellulitis and abscess of left lower extremity- I do not think he has had an allergic reaction to the sulfa drug but I think he has developed thrush.  Nonetheless, will change the antibiotic to a cephalosporin. -     Cefdinir; Take 1 capsule (300 mg  total) by mouth 2 (two) times daily for 7 days.  Dispense: 14 capsule; Refill: 0  Oral thrush -     Fluconazole; Take 2 tablets (300 mg total) by mouth daily for 10 days.  Dispense: 20 tablet; Refill: 0     Follow-up: Return in about 3 months (around 07/29/2023).  Sanda Linger, MD

## 2023-04-28 NOTE — Patient Instructions (Signed)
Heart Failure, Diagnosis  Heart failure is a condition in which the heart has trouble pumping blood. This may mean that the heart cannot pump enough blood out to the body or that the heart does not fill up with enough blood. For some people with heart failure, fluid may back up into the lungs. There may also be swelling (edema) in the lower legs. Heart failure is usually a long-term (chronic) condition. It is important for you to take good care of yourself and follow the treatment plan from your health care provider. Different stages of heart failure have different treatment plans. The stages are: Stage A: At risk for heart failure. Having no symptoms of heart failure, but being at risk for developing heart failure. Stage B: Pre-heart failure. Having no symptoms of heart failure, but having structural changes to the heart that indicate heart failure. Stage C: Symptomatic heart failure. Having symptoms of heart failure in addition to structural changes to the heart that indicate heart failure. Stage D: Advanced heart failure. Having symptoms that interfere with daily life and frequent hospitalizations related to heart failure. What are the causes? This condition may be caused by: High blood pressure (hypertension). Hypertension causes the heart muscle to work harder than normal. Coronary artery disease, or CAD. CAD is the buildup of cholesterol and fat (plaque) in the arteries of the heart. Heart attack, also called myocardial infarction. This injures the heart muscle, making it hard for the heart to pump blood. Abnormal heart valves. The valves do not open and close properly, forcing the heart to pump harder to keep the blood flowing. Heart muscle disease, inflammation, or infection (cardiomyopathy or myocarditis). This is damage to the heart muscle. It can increase the risk of heart failure. Lung disease. The heart works harder when the lungs are not healthy. What increases the risk? The risk  of heart failure increases as a person ages. This condition is also more likely to develop in people who: Are obese. Use tobacco or nicotine products. Abuse alcohol or drugs. Have taken medicines that can damage the heart, such as chemotherapy drugs. Have any of these conditions: Diabetes. Abnormal heart rhythms. Thyroid problems. Low blood counts (anemia). Chronic kidney disease. Have a family history of heart failure. What are the signs or symptoms? Symptoms of this condition include: Shortness of breath with activity, such as when climbing stairs. A cough that does not go away. Swelling of the feet, ankles, legs, or abdomen. Losing or gaining weight for no reason. Trouble breathing when lying flat. Waking from sleep because of the need to sit up and get more air. Rapid heartbeat. Other symptoms may include: Tiredness (fatigue) and loss of energy. Feeling light-headed, dizzy, or close to fainting. Nausea or loss of appetite. Waking up more often during the night to urinate (nocturia). Confusion. How is this diagnosed? This condition is diagnosed based on: Your medical history, symptoms, and a physical exam. Blood tests. Diagnostic tests, which may include: Echocardiogram. Electrocardiogram (ECG). Chest X-ray. Exercise stress test. Cardiac MRI. Cardiac catheterization and angiogram. Radionuclide scans. How is this treated? Treatment for this condition is aimed at managing the symptoms of heart failure. Medicines Treatment may include medicines that: Help lower blood pressure by relaxing (dilating) the blood vessels. These medicines are called ACE inhibitors (angiotensin-converting enzyme), ARBs (angiotensin receptor blockers), or vasodilators. Cause the kidneys to remove salt and water from the blood through urination (diuretics). Improve heart muscle strength and prevent the heart from beating too fast (beta blockers). Increase the   force of the heartbeat  (digoxin). Lower heart rates. Certain diabetes medicines (SGLT-2 inhibitors) may also be used in treatment. Healthy behavior changes Treatment may also include making healthy lifestyle changes, such as: Reaching and staying at a healthy weight. Not using tobacco or nicotine products. Eating heart-healthy foods. Limiting or avoiding alcohol. Stopping the use of illegal drugs. Being physically active. Participating in a cardiac rehabilitation program, which is a treatment program to improve your health and well-being through exercise training, education, and counseling. Other treatments Other treatments may include: Procedures to open blocked arteries or repair damaged valves. Placing a pacemaker to improve heart function (cardiac resynchronization therapy). Placing a device to treat serious abnormal heart rhythms (implantable cardioverter defibrillator, or ICD). Placing a device to improve the pumping ability of the heart (left ventricular assist device, or LVAD). Receiving a healthy heart from a donor (heart transplant). This is done when other treatments have not helped. Follow these instructions at home: Manage other health conditions as told by your health care provider. These may include hypertension, diabetes, thyroid disease, or abnormal heart rhythms. Get ongoing education and support as needed. Learn as much as you can about heart failure. Keep all follow-up visits. This is important. Where to find more information American Heart Association: www.heart.org Centers for Disease Control and Prevention: www.cdc.gov NIH National Institute on Aging: www.nia.nih.gov Summary Heart failure is a condition in which the heart has trouble pumping blood. This condition is commonly caused by high blood pressure and other diseases of the heart and lungs. Symptoms of this condition include shortness of breath, tiredness (fatigue), nausea, and swelling of the feet, ankles, legs, or  abdomen. Treatments for this condition may include medicines, lifestyle changes, and surgery. Manage other health conditions as told by your health care provider. This information is not intended to replace advice given to you by your health care provider. Make sure you discuss any questions you have with your health care provider. Document Revised: 03/17/2022 Document Reviewed: 06/29/2020 Elsevier Patient Education  2023 Elsevier Inc.  

## 2023-04-29 ENCOUNTER — Telehealth: Payer: Self-pay | Admitting: Internal Medicine

## 2023-04-29 ENCOUNTER — Ambulatory Visit: Payer: Medicare Other | Admitting: Family Medicine

## 2023-04-29 NOTE — Telephone Encounter (Signed)
Patient was prescribed multiple medications yesterday by Dr. Yetta Barre. He has questions about some of the medications because he was only expecting to have one to pick up. He would like a call back at [phone].

## 2023-04-30 ENCOUNTER — Telehealth: Payer: Self-pay

## 2023-04-30 NOTE — Telephone Encounter (Signed)
Transition Care Management Follow-up Telephone Call Date of discharge and from where: 04/26/2023 [facility] How have you been since you were released from the hospital? Patient stated he is feeling better Any questions or concerns? No  Items Reviewed: Did the pt receive and understand the discharge instructions provided? Yes  Medications obtained and verified? Yes  Other? No  Any new allergies since your discharge? No  Dietary orders reviewed? Yes Do you have support at home? Yes   Follow up appointments reviewed:  PCP Hospital f/u appt confirmed? Yes  Scheduled to see Sanda Linger MD on 04/27/2023 @ [facility] at [place]. Specialist Hospital f/u appt confirmed? No  Scheduled to see  on  @ . Are transportation arrangements needed? No  If their condition worsens, is the pt aware to call PCP or go to the Emergency Dept.? Yes Was the patient provided with contact information for the PCP's office or ED? Yes Was to pt encouraged to call back with questions or concerns? Yes  Chassie Pennix [facility]  [facility] Population Health Community Resource Care Guide   [email or web address]  ?? [phone]   Website: [email or web address]  [email or web address]

## 2023-04-30 NOTE — Telephone Encounter (Signed)
Called pt, LVM.   

## 2023-05-03 ENCOUNTER — Telehealth: Payer: Self-pay | Admitting: Internal Medicine

## 2023-05-03 NOTE — Telephone Encounter (Signed)
Pt stated that he can hardly breathe after taking the the abx. He expressed understanding not to take the levaquin that he had on hand.   He would like to know what the next options are and he is also requesting a referral to [facility].   Please advise.

## 2023-05-03 NOTE — Telephone Encounter (Signed)
No, that is not a good idea

## 2023-05-03 NOTE — Telephone Encounter (Signed)
Patient states that he had an allergic reaction to the antibiotic that Dr. Yetta Barre gave him last week.  He would like another antibiotic called in to his pharmacy:  [facility] on [address].

## 2023-05-03 NOTE — Telephone Encounter (Signed)
Patient saw Dr. Yetta Barre on 04/28/2023 for a skin issue. He would like a referral to [facility] for it. Their fax number is [phone]. Best callback for patient is [phone].

## 2023-05-03 NOTE — Telephone Encounter (Signed)
Patient wants to know if levaquin will work for this.  He already has some at home.

## 2023-05-04 ENCOUNTER — Encounter: Payer: Self-pay | Admitting: Emergency Medicine

## 2023-05-04 ENCOUNTER — Ambulatory Visit (INDEPENDENT_AMBULATORY_CARE_PROVIDER_SITE_OTHER): Payer: Medicare Other | Admitting: Emergency Medicine

## 2023-05-04 ENCOUNTER — Telehealth: Payer: Self-pay

## 2023-05-04 VITALS — BP 126/78 | HR 85 | Temp 98.4°F | Ht 70.0 in | Wt 238.0 lb

## 2023-05-04 DIAGNOSIS — L03116 Cellulitis of left lower limb: Secondary | ICD-10-CM | POA: Diagnosis not present

## 2023-05-04 MED ORDER — LEVOFLOXACIN 500 MG PO TABS
500.0000 mg | ORAL_TABLET | Freq: Every day | ORAL | 0 refills | Status: AC
Start: 2023-05-04 — End: 2023-05-11

## 2023-05-04 NOTE — Telephone Encounter (Signed)
The patient called stating that one of his legs is swelling. He thinks it may be fluid. We checked his remote and His fluid levels are normal.   He also states the emergency room thinks he have cellulites. Boneta Lucks, rn suggested to him that he call his primary care but he would like to come in and see Dr. Kittie Plater.

## 2023-05-04 NOTE — Telephone Encounter (Signed)
I spoke to patient and updated him on the information.

## 2023-05-04 NOTE — Telephone Encounter (Signed)
Patient called to see what provider would like for him to do. He would like a call back at [phone].

## 2023-05-04 NOTE — Assessment & Plan Note (Signed)
Active infection.  No signs of sepsis. Allergic to many different antibiotics including penicillins, cephalosporins, sulfa's and tetracyclines. Able to take quinolones. Recommend to start levofloxacin 500 mg daily for 7 days. Recommended to take probiotics daily for the next 1 to 2 weeks ED precautions given. Advised to contact the office if no better or worse during the next several days.

## 2023-05-04 NOTE — Progress Notes (Signed)
Robert Church 67 y.o.   Chief Complaint  Patient presents with   Follow-up    Patient was seen in the ED on 5/6, patient was put on medication and was not able to take. He has not been taking any medication    HISTORY OF PRESENT ILLNESS: Acute problem visit today.  Patient of Dr. Sanda Linger. This is a 67 y.o. male here for follow-up of left lower leg cellulitis.  Was seen in emergency department on 04/26/2023 and was started on Bactrim after negative workup including ultrasound to rule out DVT.  However patient allergic to sulfas.  Did not take it.  Seen in the office by Dr. Yetta Barre on 04/28/2023 and started on cefdinir but developed difficulty breathing after just 1 pill.  Did not have to use EpiPen at home.  Here for reevaluation. Has multiple allergies to many different antibiotics.  Only when he remembers taking without any reaction is levofloxacin.  HPI   Prior to Admission medications   Medication Sig Start Date End Date Taking? Authorizing Provider  albuterol (PROVENTIL) (2.5 MG/3ML) 0.083% nebulizer solution Take 3 mLs (2.5 mg total) by nebulization every 4 (four) hours as needed for wheezing or shortness of breath. 11/26/22  Yes Leslye Peer, MD  albuterol (VENTOLIN HFA) 108 (90 Base) MCG/ACT inhaler INHALE 2 PUFFS INTO THE LUNGS EVERY 6 HOURS AS NEEDED FOR WHEEZING 02/09/23  Yes Byrum, Les Pou, MD  colchicine 0.6 MG tablet Take 0.6 mg by mouth daily as needed (gout pain).   Yes [provider]  DALIRESP 500 MCG TABS tablet TAKE 1 TABLET(500 MCG) BY MOUTH DAILY 03/21/23  Yes Byrum, Les Pou, MD  diclofenac Sodium (VOLTAREN) 1 % GEL Apply 1 Application topically daily at 12 noon.   Yes [provider]  EPINEPHrine 0.3 mg/0.3 mL IJ SOAJ injection Inject 0.3 mg into the muscle as needed for anaphylaxis. 07/13/22  Yes [name], MD  fluticasone (FLONASE) 50 MCG/ACT nasal spray SHAKE LIQUID AND USE 2 SPRAYS IN EACH NOSTRIL DAILY Patient taking differently: Place 1  spray into both nostrils daily as needed for allergies. 06/29/22  Yes Leslye Peer, MD  guaiFENesin (MUCINEX) 600 MG 12 hr tablet Take 1 tablet (600 mg total) by mouth 2 (two) times daily. Patient taking differently: Take 600 mg by mouth 2 (two) times daily as needed for cough or to loosen phlegm. 12/17/21  Yes Leslye Peer, MD  icosapent Ethyl (VASCEPA) 1 g capsule Take 2 capsules (2 g total) by mouth 2 (two) times daily. 03/19/23  Yes [name], MD  ipratropium (ATROVENT) 0.06 % nasal spray Place 2 sprays into both nostrils 4 (four) times daily as needed for rhinitis. 03/17/23  Yes Leslye Peer, MD  lidocaine (XYLOCAINE) 5 % ointment Apply topically 3 (three) times daily as needed. Patient taking differently: Apply 1 Application topically daily as needed (Fissure). 09/15/22  Yes [name], MD  Lidocaine, Anorectal, 5 % CREA Apply 1 application topically 2 (two) times daily. Patient taking differently: Apply 1 application  topically daily as needed (Fissure). 01/08/22  Yes Zehr, Princella Pellegrini, PA-C  loratadine (CLARITIN) 10 MG tablet Take 10 mg by mouth daily.   Yes [provider]  losartan (COZAAR) 25 MG tablet Take 1 tablet (25 mg total) by mouth daily. 01/13/23  Yes Bensimhon, Bevelyn Buckles, MD  metolazone (ZAROXOLYN) 2.5 MG tablet Take 1 tablet (2.5 mg total) by mouth as directed. Patient taking differently: Take 2.5 mg by  mouth daily as needed (swelling). 01/13/23  Yes Bensimhon, Bevelyn Buckles, MD  mometasone-formoterol (DULERA) 100-5 MCG/ACT AERO Inhale 2 puffs into the lungs 2 (two) times daily. 12/10/22  Yes Leslye Peer, MD  montelukast (SINGULAIR) 10 MG tablet TAKE 1 TABLET BY MOUTH EVERYDAY AT BEDTIME 04/09/23  Yes Byrum, Les Pou, MD  nicotine (NICODERM CQ - DOSED IN MG/24 HOURS) 14 mg/24hr patch Place 1 patch (14 mg total) onto the skin daily. 05/22/22  Yes Leslye Peer, MD  omeprazole (PRILOSEC) 20 MG capsule TAKE 1 CAPSULE(20 MG) BY MOUTH TWICE DAILY BEFORE A MEAL FOR  ACID REFLUX 04/28/23  Yes [name], MD  potassium chloride SA (KLOR-CON M20) 20 MEQ tablet Take 1 tablet (20 mEq total) by mouth daily. Take an extra 40 meq when you take metolazone 01/18/23  Yes Bensimhon, Bevelyn Buckles, MD  pravastatin (PRAVACHOL) 10 MG tablet Take 1 tablet (10 mg total) by mouth daily. 03/19/23  Yes [name], MD  predniSONE (DELTASONE) 10 MG tablet TAKE 1 TABLET BY MOUTH EVERY DAY WITH BREAKFAST 04/28/23  Yes [name], MD  Semaglutide (RYBELSUS) 3 MG TABS Take 1 tablet (3 mg total) by mouth daily. 03/17/23  Yes [name], MD  Testosterone 20.25 MG/1.25GM (1.62%) GEL Place 1.25 g onto the skin daily. Patient taking differently: Place 1 Pump onto the skin every other day. 06/29/22  Yes [name], MD  Tiotropium Bromide Monohydrate (SPIRIVA RESPIMAT) 2.5 MCG/ACT AERS Inhale 2 puffs into the lungs daily. Patient taking differently: Inhale 1 puff into the lungs 2 (two) times daily. 12/10/22  Yes Leslye Peer, MD  torsemide (DEMADEX) 20 MG tablet Take 1 tablet (20 mg total) by mouth 2 (two) times daily. 04/28/23  Yes [name], MD  vitamin B-12 (CYANOCOBALAMIN) 1000 MCG tablet Take 1,000 mcg by mouth daily.   Yes [provider]  allopurinol (ZYLOPRIM) 300 MG tablet Take 1 tablet (300 mg total) by mouth daily. 03/18/23   [name], MD  ALPRAZolam Prudy Feeler) 0.5 MG tablet Take 1 tablet (0.5 mg total) by mouth 3 (three) times daily. 04/05/23   [name], MD  AMBULATORY NON FORMULARY MEDICATION Medication Name: Nitroglycerin gel 0.125% apply 2-3 times daily to the anal canal. Patient taking differently: Place 1 Application rectally daily as needed (Fissure). Medication Name: Nitroglycerin gel 0.125% apply 2-3 times daily to the anal canal. 01/08/22   Zehr, Princella Pellegrini, PA-C  cefdinir (OMNICEF) 300 MG capsule Take 1 capsule (300 mg total) by mouth 2 (two) times daily for 7 days. Patient not taking: Reported on 05/04/2023 04/28/23 05/05/23  [name], MD  fluconazole (DIFLUCAN) 150 MG tablet Take 2 tablets (300 mg total) by mouth daily for 10 days. Patient not taking: Reported on 05/04/2023 04/28/23 05/08/23  [name], MD    Allergies  Allergen Reactions   Bee Venom Shortness Of Breath and Swelling   Spironolactone Anaphylaxis   Daliresp [Roflumilast] Other (See Comments)    Dizzy, headache, leg pain per patient. 02/25/22. Tolerates in low doses    Jardiance [Empagliflozin] Nausea Only and Other (See Comments)    Lightheadness Dizziness   Penicillins Swelling and Other (See Comments)    Childhood rxn--MD stated he "almost died" Has patient had a PCN reaction causing immediate rash, facial/tongue/throat swelling, SOB or lightheadedness with hypotension:Yes Has patient had a PCN reaction causing severe rash involving mucus membranes or skin necrosis:unsure Has patient had a PCN reaction that required  hospitalization:unsure Has patient had a PCN reaction occurring within the last 10 years:No If all of the above answers are "NO", then may proceed with Cephalosporin use.     Clindamycin Other (See Comments)    Tongue swelling   Doxycycline Nausea And Vomiting    Severe stomach upset per patient   E-Mycin [Erythromycin Base] Swelling   Rosuvastatin Other (See Comments)    Myalgias (intolerance)    Patient Active Problem List   Diagnosis Date Noted   Cellulitis and abscess of left lower extremity 04/28/2023   Oral thrush 04/28/2023   Screen for colon cancer 03/21/2023   Alcoholic cirrhosis of liver without ascites (HCC) 03/19/2023   Need for prophylactic vaccination with combined diphtheria-tetanus-pertussis (DTP) vaccine 03/18/2023   Bilateral plantar fasciitis 01/04/2023   Cervical radiculopathy 01/03/2023   Bilateral primary osteoarthritis of knee 06/30/2022   Prostate cancer screening 06/29/2022   Hypogonadism in male 06/07/2022   Chronic respiratory failure with hypoxia (HCC) 11/24/2021   Encounter for general  adult medical examination with abnormal findings 08/20/2021   GAD (generalized anxiety disorder) 07/04/2021   Degenerative disc disease, cervical 04/21/2021   Carotid artery calcification 04/09/2021   Panlobular emphysema (HCC) 03/25/2021   Atherosclerosis of aorta (HCC) 03/25/2021   Olecranon bursitis of left elbow 02/08/2021   Body mass index (BMI) 32.0-32.9, adult 01/15/2021   Lumbar spondylosis 01/10/2021   Foraminal stenosis of cervical region 01/10/2021   Chronic bilateral low back pain without sciatica 01/09/2021   Type II diabetes mellitus with manifestations (HCC) 11/29/2020   Primary osteoarthritis of both knees 09/24/2020   Idiopathic chronic gout of foot without tophus 08/08/2020   Yellow jacket sting allergy 08/08/2020   Class 2 severe obesity due to excess calories with serious comorbidity and body mass index (BMI) of 38.0 to 38.9 in adult (HCC) 08/05/2020   Hyperlipidemia LDL goal <70 07/11/2020   Hypertriglyceridemia 07/08/2020   Chronic combined systolic and diastolic CHF (congestive heart failure) (HCC) 01/09/2020   Solitary pulmonary nodule 08/15/2019   NICM (nonischemic cardiomyopathy) (HCC) 12/16/2018   Cigarette smoker 01/16/2015   COPD (chronic obstructive pulmonary disease) (HCC) 06/15/2014   Obstructive sleep apnea syndrome 01/05/2013   Asbestos exposure 01/05/2013   Seasonal allergic rhinitis 10/06/2012   GERD (gastroesophageal reflux disease) 06/05/2012   Intrinsic asthma 07/23/2011   HTN (hypertension) 07/23/2011    Past Medical History:  Diagnosis Date   Adenomatous colon polyp    Allergy    Anal fissure    Arthritis    Asthma    Bronchitis    CHF (congestive heart failure) (HCC)    COPD, group D, by GOLD 2017 classification (HCC)    Dyspnea    Emphysema lung (HCC)    GERD (gastroesophageal reflux disease)    History of hiatal hernia    Hyperlipidemia    Hypertension    NICM (nonischemic cardiomyopathy) (HCC)    OSA (obstructive sleep  apnea)    Pneumonia    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   Requires supplemental oxygen    Sinusitis    SOB (shortness of breath)     Past Surgical History:  Procedure Laterality Date   BIV UPGRADE N/A 10/07/2021   Procedure: BIV PPM UPGRADE;  Surgeon: Regan Lemming, MD;  Location: [facility];  Service: Cardiovascular;  Laterality: N/A;   BIV UPGRADE N/A 11/11/2022   Procedure: BIV PPM UPGRADE;  Surgeon: Regan Lemming, MD;  Location: [facility];  Service: Cardiovascular;  Laterality: N/A;   LEFT HEART CATH AND CORONARY ANGIOGRAPHY N/A 04/19/2017   Procedure: Left Heart Cath and Coronary Angiography;  Surgeon: Lyn Records, MD;  Location: [facility] CV [facility];  Service: Cardiovascular;  Laterality: N/A;   PACEMAKER IMPLANT N/A 04/16/2021   Procedure: PACEMAKER IMPLANT;  Surgeon: Regan Lemming, MD;  Location: [facility];  Service: Cardiovascular;  Laterality: N/A;   RIGHT HEART CATH N/A 03/09/2023   Procedure: RIGHT HEART CATH;  Surgeon: Dolores Patty, MD;  Location: [facility];  Service: Cardiovascular;  Laterality: N/A;   RIGHT/LEFT HEART CATH AND CORONARY ANGIOGRAPHY N/A 08/22/2021   Procedure: RIGHT/LEFT HEART CATH AND CORONARY ANGIOGRAPHY;  Surgeon: Dolores Patty, MD;  Location: [facility];  Service: Cardiovascular;  Laterality: N/A;   TONSILLECTOMY      Social History   Socioeconomic History   Marital status: Single    Spouse name: Not on file   Number of children: 1   Years of education: 20   Highest education level: 12th grade  Occupational History   Occupation: disabled  Tobacco Use   Smoking status: Some Days    Packs/day: 2.00    Years: 46.00    Additional pack years: 0.00    Total pack years: 92.00    Types: Cigarettes    Last attempt to quit: 01/27/2022    Years since quitting: 1.2    Passive exposure: Past   Smokeless tobacco: Never   Tobacco comments:    Had 3 cigarettes in  past 2 weeks ARJ 10/28/22  Vaping Use   Vaping Use: Never used  Substance and Sexual Activity   Alcohol use: Not Currently    Alcohol/week: 28.0 standard drinks of alcohol    Types: 28 Cans of beer per week   Drug use: No   Sexual activity: Yes    Partners: Female    Birth control/protection: None  Other Topics Concern   Not on file  Social History Narrative   Right handed   Tea sometimes and coffee (1/2 caffeine)   Social Determinants of Health   Financial Resource Strain: Medium Risk (03/15/2023)   Overall Financial Resource Strain (CARDIA)    Difficulty of Paying Living Expenses: Somewhat hard  Food Insecurity: Unknown (03/15/2023)   Hunger Vital Sign    Worried About Running Out of Food in the Last Year: Patient declined    Ran Out of Food in the Last Year: Never true  Transportation Needs: No Transportation Needs (03/15/2023)   PRAPARE - Administrator, Civil Service (Medical): No    Lack of Transportation (Non-Medical): No  Physical Activity: Unknown (03/15/2023)   Exercise Vital Sign    Days of Exercise per Week: 0 days    Minutes of Exercise per Session: Not on file  Stress: Stress Concern Present (03/15/2023)   [employer] of Occupational Health - Occupational Stress Questionnaire    Feeling of Stress : Rather much  Social Connections: Unknown (03/15/2023)   Social Connection and Isolation Panel [NHANES]    Frequency of Communication with Friends and Family: More than three times a week    Frequency of Social Gatherings with Friends and Family: Twice a week    Attends Religious Services: Patient declined    Database administrator or Organizations: No    Attends Engineer, structural: Not on file    Marital Status: Divorced  Intimate Partner Violence: Not on file  Family History  Problem Relation Age of Onset   Lung cancer Father    High blood pressure Mother    Diabetes Maternal Grandmother    Colon polyps Neg Hx    Esophageal cancer  Neg Hx    Pancreatic cancer Neg Hx    Stomach cancer Neg Hx      Review of Systems  Constitutional: Negative.  Negative for chills and fever.  HENT: Negative.  Negative for congestion and sore throat.   Respiratory: Negative.  Negative for cough and shortness of breath.   Cardiovascular: Negative.  Negative for chest pain and palpitations.  Gastrointestinal:  Negative for abdominal pain, nausea and vomiting.  Skin:  Positive for rash.  Neurological: Negative.  Negative for dizziness and headaches.    Today's Vitals   05/04/23 1432  BP: 126/78  Pulse: 85  Temp: 98.4 F (36.9 C)  TempSrc: Oral  SpO2: 92%  Weight: 238 lb (108 kg)  Height: 5\' 10"  (1.778 m)   Body mass index is 34.15 kg/m.   Physical Exam Vitals reviewed.  Constitutional:      Appearance: Normal appearance.  HENT:     Head: Normocephalic.  Eyes:     Extraocular Movements: Extraocular movements intact.  Cardiovascular:     Rate and Rhythm: Normal rate.  Pulmonary:     Effort: Pulmonary effort is normal.  Skin:    General: Skin is warm and dry.     Findings: Erythema (Left lower leg) and rash present.  Neurological:     Mental Status: He is alert and oriented to person, place, and time.  Psychiatric:        Mood and Affect: Mood normal.        Behavior: Behavior normal.      ASSESSMENT & PLAN: A total of 33 minutes was spent with the patient and counseling/coordination of care regarding preparing for this visit, review of most recent office visit notes, review of emergency department visit notes from 04/26/2023, review of multiple chronic medical conditions under management, review of all medications, diagnosis of cellulitis and need for antibiotic treatment, prognosis, review of most recent blood work results, documentation and need for follow-up if no better or worse during the next several days.  Problem List Items Addressed This Visit       Other   Cellulitis of left lower leg - Primary     Active infection.  No signs of sepsis. Allergic to many different antibiotics including penicillins, cephalosporins, sulfa's and tetracyclines. Able to take quinolones. Recommend to start levofloxacin 500 mg daily for 7 days. Recommended to take probiotics daily for the next 1 to 2 weeks ED precautions given. Advised to contact the office if no better or worse during the next several days.      Relevant Medications   levofloxacin (LEVAQUIN) 500 MG tablet   Patient Instructions  Cellulitis, Adult  Cellulitis is a skin infection. The infected area is often warm, red, swollen, and sore. It occurs most often on the legs, feet, and toes, but can happen on any part of the body. This condition can be life-threatening without treatment. It is very important to get treated right away. What are the causes? This condition is caused by bacteria. The bacteria enter through a break in the skin, such as: A cut. A burn. A bug bite. An animal bite. An open sore. A crack. What increases the risk? Having a weak body's defense system (immune system). Being older than 65 years old. Having  a blood sugar problem (diabetes). Having a long-term liver disease (cirrhosis) or kidney disease. Being very overweight (obese). Having a skin problem, such as: An itchy rash. A rash caused by a fungus. A rash with blisters. Slow movement of blood in the veins (venous stasis). Fluid buildup below the skin (edema). This condition is more likely to occur in people who: Have open cuts, burns, bites, or scrapes on the skin. Have been treated with high-energy rays (radiation). Use IV drugs. What are the signs or symptoms? Skin that: Looks red or purple, or slightly darker than your usual skin color. Has streaks. Has spots. Is swollen. Is sore or painful when you touch it. Is warm. A fever. Chills. Blisters. Tiredness (fatigue). How is this treated? Medicines to treat infections or  allergies. Rest. Placing cold or warm cloths on the skin. Staying in the hospital, if the condition is very bad. You may need medicines through an IV. Follow these instructions at home: Medicines Take over-the-counter and prescription medicines only as told by your doctor. If you were prescribed antibiotics, take them as told by your doctor. Do not stop using them even if you start to feel better. General instructions Drink enough fluid to keep your pee (urine) pale yellow. Do not touch or rub the infected area. Raise (elevate) the infected area above the level of your heart while you are sitting or lying down. Return to your normal activities when your doctor says that it is safe. Place cold or warm cloths on the area as told by your doctor. Keep all follow-up visits. Your doctor will need to make sure that a more serious infection is not developing. Contact a doctor if: You have a fever. You do not start to get better after 1-2 days of treatment. Your bone or joint under the infected area starts to hurt after the skin has healed. Your infection comes back in the same area or another area. Signs of this may include: You have a swollen bump in the area. Your red area gets larger, turns dark in color, or hurts more. You have more fluid coming from the wound. Pus or a bad smell develops in your infected area. You have more pain. You feel sick and have muscle aches and weakness. You develop vomiting or watery poop that will not go away. Get help right away if: You see red streaks coming from the area. You notice the skin turns purple or black and Church off. These symptoms may be an emergency. Get help right away. Call 911. Do not wait to see if the symptoms will go away. Do not drive yourself to the hospital. This information is not intended to replace advice given to you by your health care provider. Make sure you discuss any questions you have with your health care provider. Document  Revised: 08/04/2022 Document Reviewed: 08/04/2022 Elsevier Patient Education  2023 Elsevier Inc.    Edwina Barth, MD [employer] [facility]

## 2023-05-04 NOTE — Telephone Encounter (Signed)
Can you review information sent and advise on what he needs to do?

## 2023-05-04 NOTE — Patient Instructions (Signed)
Placed new referral to endocrinology, we need him seen more frequently and you all need assist with pump placement.  Cellulitis, Adult  Cellulitis is a skin infection. The infected area is often warm, red, swollen, and sore. It occurs most often on the legs, feet, and toes, but can happen on any part of the body. This condition can be life-threatening without treatment. It is very important to get treated right away. What are the causes? This condition is caused by bacteria. The bacteria enter through a break in the skin, such as: A cut. A burn. A bug bite. An animal bite. An open sore. A crack. What increases the risk? Having a weak body's defense system (immune system). Being older than 65 years old. Having a blood sugar problem (diabetes). Having a long-term liver disease (cirrhosis) or kidney disease. Being very overweight (obese). Having a skin problem, such as: An itchy rash. A rash caused by a fungus. A rash with blisters. Slow movement of blood in the veins (venous stasis). Fluid buildup below the skin (edema). This condition is more likely to occur in people who: Have open cuts, burns, bites, or scrapes on the skin. Have been treated with high-energy rays (radiation). Use IV drugs. What are the signs or symptoms? Skin that: Looks red or purple, or slightly darker than your usual skin color. Has streaks. Has spots. Is swollen. Is sore or painful when you touch it. Is warm. A fever. Chills. Blisters. Tiredness (fatigue). How is this treated? Medicines to treat infections or allergies. Rest. Placing cold or warm cloths on the skin. Staying in the hospital, if the condition is very bad. You may need medicines through an IV. Follow these instructions at home: Medicines Take over-the-counter and prescription medicines only as told by your doctor. If you were prescribed antibiotics, take them as told by your doctor. Do not stop using them even if you start to feel  better. General instructions Drink enough fluid to keep your pee (urine) pale yellow. Do not touch or rub the infected area. Raise (elevate) the infected area above the level of your heart while you are sitting or lying down. Return to your normal activities when your doctor says that it is safe. Place cold or warm cloths on the area as told by your doctor. Keep all follow-up visits. Your doctor will need to make sure that a more serious infection is not developing. Contact a doctor if: You have a fever. You do not start to get better after 1-2 days of treatment. Your bone or joint under the infected area starts to hurt after the skin has healed. Your infection comes back in the same area or another area. Signs of this may include: You have a swollen bump in the area. Your red area gets larger, turns dark in color, or hurts more. You have more fluid coming from the wound. Pus or a bad smell develops in your infected area. You have more pain. You feel sick and have muscle aches and weakness. You develop vomiting or watery poop that will not go away. Get help right away if: You see red streaks coming from the area. You notice the skin turns purple or black and falls off. These symptoms may be an emergency. Get help right away. Call 911. Do not wait to see if the symptoms will go away. Do not drive yourself to the hospital. This information is not intended to replace advice given to you by your health care provider. Make sure you   discuss any questions you have with your health care provider. Document Revised: 08/04/2022 Document Reviewed: 08/04/2022 Elsevier Patient Education  2023 Elsevier Inc.  

## 2023-05-06 ENCOUNTER — Encounter: Payer: Medicare Other | Admitting: Physical Medicine and Rehabilitation

## 2023-05-10 ENCOUNTER — Ambulatory Visit: Payer: Medicare Other | Admitting: Internal Medicine

## 2023-05-12 ENCOUNTER — Telehealth: Payer: Self-pay | Admitting: Emergency Medicine

## 2023-05-12 NOTE — Telephone Encounter (Signed)
Patient called back states 30 minutes after taking montelukast he was having problem breathing. Patient has stopped taking medication. Please advise.

## 2023-05-12 NOTE — Telephone Encounter (Signed)
Patient would like the nurse to call regarding a reaction he is having after taking   montelukast (SINGULAIR) 10 MG tablet   He stated he has been taking it for almost 10 years and now he has just been having some reactions.  Please call to discuss further.  CB# [phone]

## 2023-05-13 NOTE — Telephone Encounter (Signed)
Please get him to hold the montelukast/Singulair until seen by Dr. Delton Coombes next week in the office If he continues to have ongoing breathing problems will need an office evaluation for sooner follow-up or seek urgent care or emergency room evaluation  Please contact office for sooner follow up if symptoms do not improve or worsen or seek emergency care

## 2023-05-13 NOTE — Progress Notes (Signed)
Remote pacemaker transmission.   

## 2023-05-13 NOTE — Telephone Encounter (Signed)
Called and spoke with pt who states he had gone to the ED due to leg swelling and was prescribed Bactrim. From the Bactrim abx that he was given, he had problems with his mouth and tongue burning. Pt was then put on a different abx from PCP to help treat the problems that he had gone to the ED for.   Pt said about 2 days after he had the problems with the mouth burning, when he took the montelukast he said about after taking it, he began to have problems breathing. Pt said he did take benadryl after he had this problem.  Pt said after having this problem with where he started having problems with his breathing, he said he waited a day and then two days later when he took the montelukast, he had the same problem happen again. Pt said that he is still having problems with his tongue being firey red from the reaction he had to the Bactrim.   Stated Sunday 5/19 was the last day he took the montelukast as each time he took the montelukast he had the problems with feeling like he couldn't breathe.   With the possible reaction he has been having to the montelukast with him having problems with his breathing, pt wants to know if there is anything at all that might be recommended.  Routing to Tammy for review with Dr. Delton Coombes not being avail today and since pt has seen Tammy prior.  Please advise.

## 2023-05-13 NOTE — Telephone Encounter (Signed)
Called and spoke with pt letting him know the info per Tammy and he verbalized understanding.  Pt then said he thought about taking one montelukast tonight 5/23 to see if he had the same problems happen and I again told pt that Tammy stated to NOT take the medication until seen by Dr. Delton Coombes 5/29. Pt verbalized understanding. Nothing further needed.

## 2023-05-18 ENCOUNTER — Ambulatory Visit (INDEPENDENT_AMBULATORY_CARE_PROVIDER_SITE_OTHER): Payer: Medicare Other

## 2023-05-18 DIAGNOSIS — I442 Atrioventricular block, complete: Secondary | ICD-10-CM | POA: Diagnosis not present

## 2023-05-18 NOTE — Telephone Encounter (Signed)
Spoke with the pt and pt has stated he is needing a referral to the dermatologist due to having cellulitis in leg and wants to see there dermatologist for him to be treated as he was recommended to see Donzetta Starch on  [address] [place], [place], [place] [address], TEL: [phone].  He said he was unable to take meds rx'd due to causing issues with his breathing. Pt has stated there is worry about the bacteria in his lt leg is causing his cellulitis and it is concern it may get to his heart as he has a pacemaker and wants to she the above specialist to help with the medical concern mentioned above. Pt stated he has noticed his rt leg is now having cellulitis as well in it.

## 2023-05-18 NOTE — Telephone Encounter (Signed)
Patient called back and would like to see Dr. Terri Piedra instead.

## 2023-05-19 ENCOUNTER — Ambulatory Visit (INDEPENDENT_AMBULATORY_CARE_PROVIDER_SITE_OTHER): Payer: Medicare Other | Admitting: Emergency Medicine

## 2023-05-19 ENCOUNTER — Encounter: Payer: Self-pay | Admitting: Emergency Medicine

## 2023-05-19 VITALS — BP 130/76 | HR 84 | Temp 98.7°F | Ht 70.0 in | Wt 238.4 lb

## 2023-05-19 DIAGNOSIS — J431 Panlobular emphysema: Secondary | ICD-10-CM | POA: Diagnosis not present

## 2023-05-19 DIAGNOSIS — B370 Candidal stomatitis: Secondary | ICD-10-CM | POA: Diagnosis not present

## 2023-05-19 DIAGNOSIS — G4733 Obstructive sleep apnea (adult) (pediatric): Secondary | ICD-10-CM | POA: Diagnosis not present

## 2023-05-19 DIAGNOSIS — J302 Other seasonal allergic rhinitis: Secondary | ICD-10-CM

## 2023-05-19 LAB — CUP PACEART REMOTE DEVICE CHECK
Battery Remaining Longevity: 86 mo
Battery Remaining Percentage: 90 %
Battery Voltage: 2.99 V
Brady Statistic AP VP Percent: 1 %
Brady Statistic AP VS Percent: 1 %
Brady Statistic AS VP Percent: 99 %
Brady Statistic AS VS Percent: 1 %
Brady Statistic RA Percent Paced: 1 %
Date Time Interrogation Session: 20240528020016
Implantable Lead Connection Status: 753985
Implantable Lead Connection Status: 753985
Implantable Lead Connection Status: 753985
Implantable Lead Implant Date: 20220427
Implantable Lead Implant Date: 20220427
Implantable Lead Implant Date: 20231122
Implantable Lead Location: 753858
Implantable Lead Location: 753859
Implantable Lead Location: 753860
Implantable Pulse Generator Implant Date: 20231122
Lead Channel Impedance Value: 480 Ohm
Lead Channel Impedance Value: 490 Ohm
Lead Channel Impedance Value: 610 Ohm
Lead Channel Pacing Threshold Amplitude: 0.625 V
Lead Channel Pacing Threshold Amplitude: 0.625 V
Lead Channel Pacing Threshold Amplitude: 0.875 V
Lead Channel Pacing Threshold Pulse Width: 0.5 ms
Lead Channel Pacing Threshold Pulse Width: 0.5 ms
Lead Channel Pacing Threshold Pulse Width: 0.5 ms
Lead Channel Sensing Intrinsic Amplitude: 2.1 mV
Lead Channel Sensing Intrinsic Amplitude: 9.4 mV
Lead Channel Setting Pacing Amplitude: 1.625
Lead Channel Setting Pacing Amplitude: 2 V
Lead Channel Setting Pacing Amplitude: 2 V
Lead Channel Setting Pacing Pulse Width: 0.5 ms
Lead Channel Setting Pacing Pulse Width: 0.5 ms
Lead Channel Setting Sensing Sensitivity: 2 mV
Pulse Gen Model: 3222
Pulse Gen Serial Number: 3979797

## 2023-05-19 MED ORDER — MONTELUKAST SODIUM 5 MG PO CHEW: 10.0000 mg | CHEWABLE_TABLET | Freq: Every day | ORAL | 2 refills | Status: DC

## 2023-05-19 MED ORDER — NYSTATIN 100000 UNIT/ML MT SUSP: 5.0000 mL | Freq: Three times a day (TID) | OROMUCOSAL | 0 refills | Status: DC

## 2023-05-19 NOTE — Assessment & Plan Note (Signed)
Continue his current regimen 

## 2023-05-19 NOTE — Patient Instructions (Addendum)
Please stop your current montelukast.  We will start chewable montelukast 5 mg, take 2 tablets each evening. Please use nystatin swish and swallow 3 times a day for 3 days and then stop. Continue your other medications as you have been taking them Your next lung cancer screening CT scan of the chest will be due in March 2025 Please follow Dr. Delton Coombes in 3 months, sooner if you have problems.

## 2023-05-19 NOTE — Progress Notes (Signed)
Subjective:  Patient ID: Robert Church, male    DOB: 08/24/1956  Age: 67 y.o. MRN: [id number]  CC:  Chief Complaint  Patient presents with   Follow-up    Good days and bad days   HPI  ROV 02/16/2023 --67 year old gentleman with multifactorial chronic hypoxemic respiratory failure.  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  It looks like he is scheduled for left and right heart catheterization on 02/19/2023 with Dr. Gala Romney. We follow him for severe COPD.  Unfortunately he continues to smoke.  Currently managed on nocturnal trilogy ventilator with oxygen bled in (4 L/min, documented on overnight oximetry).  He has oxygen available to use 2 L/min with exertion.  He wants to be qualified for POC.  Currently managed on Spiriva plus Dulera, Singulair, prednisone 10 mg, Daliresp.  He also uses fluticasone and Atrovent nasal sprays, omeprazole twice daily. He is using nasal saline.  He had COVID-19, tested + 02/08/2023.  I had ordered molnupiravir for him, didn't get it because it never got a PA. His sleep is poor, still has low energy, coughing up some white mucous. He is having sciatic pain and cannot do a walking oximetry today.  Due for LDCT 03/17/23  ROV 05/19/23 -follow-up visit for 67 year old man multifactorial chronic hypoxemic respiratory failure.  He has an ischemic cardiomyopathy and third-degree heart block with a pacer, is followed at the advanced heart failure clinic.  He also has obesity, severe COPD.  He still smokes.  He is on a nocturnal trilogy device.  Maintenances Spiriva, Dulera, montelukast, prednisone 10 mg, Daliresp, fluticasone and Atrovent nasal sprays, omeprazole twice daily.  He is having download issues with his trilogy, also the battery backup.  Working with his DME. LLE cellulitis about 1 month ago, then pain. Was started on bactrim in the ED, caused side effects  of stomatitis, mouth burning. Then changed to cefdinir > caused him to have dyspnea, again took benadryl. Then tried on levaquin, completed full course. He is still having LLE pain and some edema. Since he took the bactrim, he has been unable to the montelukast, difficulty swallowing it.    Objective:   Today's Vitals:  BP 130/76 (BP Location: Left Arm, Patient Position: Sitting, Cuff Size: Normal)   Pulse 84   Temp 98.7 F (37.1 C) (Oral)   Ht 5\' 10"  (1.778 m)   Wt 238 lb 6.4 oz (108.1 kg)   SpO2 93%   BMI 34.21 kg/m   Gen: Pleasant, well-nourished, in no distress,  normal affect  ENT: No lesions,  mouth clear,  oropharynx clear, no postnasal drip  Neck: No JVD, no stridor  Lungs: No use of accessory muscles, distant, no crackles, few B exp wheezes w cough.   Cardiovascular: RRR, heart sounds normal, no murmur or gallops, no peripheral edema  Musculoskeletal: No deformities, no cyanosis or clubbing  Neuro: alert, awake, non focal  Skin: Patchy chronic erythema on his left calf with slight warmth.  No purulence.  No drainage.   Assessment & Plan:  Seasonal allergic rhinitis He had a reaction to Bactrim with stomatitis.  Needs to be added to his allergy list.  Subsequently had difficulty tolerating cefdinir as well.  He finished a course of levofloxacin but unclear whether it really helped his left calf cellulitis.  In the aftermath of all of this he has been unable to take his montelukast.  Unclear whether he is having difficulty swallowing the actual pill or whether it is the medication which is itself causing swallowing difficulty.  He thinks that the former and wants to change to a chewable tablet.  I think it is reasonable to try this and we will change the formulation.  He will continue all of his other usual medications.  COPD (chronic obstructive pulmonary disease) (HCC) Continue his current regimen  Obstructive sleep apnea syndrome With chronic respiratory failure  due to his COPD and OHS.  He has a trilogy ventilator with good compliance.  He is working on getting some repairs done with his DME.  Oral thrush White tongue coating, residual stomatitis on roof of his mouth. Will treat with nystatin.      Levy Pupa, MD, PhD 05/19/2023, 4:57 PM [facility] [phone] or if no answer [phone]

## 2023-05-19 NOTE — Assessment & Plan Note (Signed)
He had a reaction to Bactrim with stomatitis.  Needs to be added to his allergy list.  Subsequently had difficulty tolerating cefdinir as well.  He finished a course of levofloxacin but unclear whether it really helped his left calf cellulitis.  In the aftermath of all of this he has been unable to take his montelukast.  Unclear whether he is having difficulty swallowing the actual pill or whether it is the medication which is itself causing swallowing difficulty.  He thinks that the former and wants to change to a chewable tablet.  I think it is reasonable to try this and we will change the formulation.  He will continue all of his other usual medications.

## 2023-05-19 NOTE — Assessment & Plan Note (Signed)
With chronic respiratory failure due to his COPD and OHS.  He has a trilogy ventilator with good compliance.  He is working on getting some repairs done with his DME.

## 2023-05-19 NOTE — Assessment & Plan Note (Signed)
White tongue coating, residual stomatitis on roof of his mouth. Will treat with nystatin.

## 2023-05-20 ENCOUNTER — Telehealth (HOSPITAL_COMMUNITY): Payer: Self-pay

## 2023-05-20 ENCOUNTER — Encounter (HOSPITAL_COMMUNITY): Payer: Self-pay

## 2023-05-20 ENCOUNTER — Encounter: Payer: Self-pay | Admitting: Emergency Medicine

## 2023-05-20 ENCOUNTER — Ambulatory Visit (INDEPENDENT_AMBULATORY_CARE_PROVIDER_SITE_OTHER): Payer: Medicare Other | Admitting: Emergency Medicine

## 2023-05-20 VITALS — BP 132/76 | HR 95 | Temp 98.0°F | Ht 70.0 in | Wt 238.0 lb

## 2023-05-20 DIAGNOSIS — D367 Benign neoplasm of other specified sites: Secondary | ICD-10-CM

## 2023-05-20 DIAGNOSIS — L03116 Cellulitis of left lower limb: Secondary | ICD-10-CM

## 2023-05-20 MED ORDER — LEVOFLOXACIN 750 MG PO TABS
750.0000 mg | ORAL_TABLET | Freq: Every day | ORAL | 1 refills | Status: AC
Start: 2023-05-20 — End: 2023-05-27

## 2023-05-20 NOTE — Patient Instructions (Signed)
Cellulitis, Adult  Cellulitis is a skin infection. The infected area is often warm, red, swollen, and sore. It occurs most often on the legs, feet, and toes, but can happen on any part of the body. This condition can be life-threatening without treatment. It is very important to get treated right away. What are the causes? This condition is caused by bacteria. The bacteria enter through a break in the skin, such as: A cut. A burn. A bug bite. An animal bite. An open sore. A crack. What increases the risk? Having a weak body's defense system (immune system). Being older than 65 years old. Having a blood sugar problem (diabetes). Having a long-term liver disease (cirrhosis) or kidney disease. Being very overweight (obese). Having a skin problem, such as: An itchy rash. A rash caused by a fungus. A rash with blisters. Slow movement of blood in the veins (venous stasis). Fluid buildup below the skin (edema). This condition is more likely to occur in people who: Have open cuts, burns, bites, or scrapes on the skin. Have been treated with high-energy rays (radiation). Use IV drugs. What are the signs or symptoms? Skin that: Looks red or purple, or slightly darker than your usual skin color. Has streaks. Has spots. Is swollen. Is sore or painful when you touch it. Is warm. A fever. Chills. Blisters. Tiredness (fatigue). How is this treated? Medicines to treat infections or allergies. Rest. Placing cold or warm cloths on the skin. Staying in the hospital, if the condition is very bad. You may need medicines through an IV. Follow these instructions at home: Medicines Take over-the-counter and prescription medicines only as told by your doctor. If you were prescribed antibiotics, take them as told by your doctor. Do not stop using them even if you start to feel better. General instructions Drink enough fluid to keep your pee (urine) pale yellow. Do not touch or rub the  infected area. Raise (elevate) the infected area above the level of your heart while you are sitting or lying down. Return to your normal activities when your doctor says that it is safe. Place cold or warm cloths on the area as told by your doctor. Keep all follow-up visits. Your doctor will need to make sure that a more serious infection is not developing. Contact a doctor if: You have a fever. You do not start to get better after 1-2 days of treatment. Your bone or joint under the infected area starts to hurt after the skin has healed. Your infection comes back in the same area or another area. Signs of this may include: You have a swollen bump in the area. Your red area gets larger, turns dark in color, or hurts more. You have more fluid coming from the wound. Pus or a bad smell develops in your infected area. You have more pain. You feel sick and have muscle aches and weakness. You develop vomiting or watery poop that will not go away. Get help right away if: You see red streaks coming from the area. You notice the skin turns purple or black and falls off. These symptoms may be an emergency. Get help right away. Call 911. Do not wait to see if the symptoms will go away. Do not drive yourself to the hospital. This information is not intended to replace advice given to you by your health care provider. Make sure you discuss any questions you have with your health care provider. Document Revised: 08/04/2022 Document Reviewed: 08/04/2022 [employer] Patient Education  2024 [employer]

## 2023-05-20 NOTE — Progress Notes (Signed)
Robert Church 67 y.o.   Chief Complaint  Patient presents with   Medical Management of Chronic Issues    F/u cellulitis of left leg, still some swelling, has appt with dermatology.    Cyst    Patient has a knot on his left arm near his elbow    HISTORY OF PRESENT ILLNESS: Acute problem visit today.  Patient of Dr. Sanda Linger This is a 67 y.o. male here for follow-up of 05/04/2023 office visit when he was diagnosed with left lower leg cellulitis and started on levofloxacin 500 mg Infection still present Has appointment to see dermatologist on 05/31/2023 Also complaining of cyst to left upper forearm No other complaints or medical concerns today.  HPI   Prior to Admission medications   Medication Sig Start Date End Date Taking? Authorizing Provider  albuterol (PROVENTIL) (2.5 MG/3ML) 0.083% nebulizer solution Take 3 mLs (2.5 mg total) by nebulization every 4 (four) hours as needed for wheezing or shortness of breath. 11/26/22  Yes Leslye Peer, MD  albuterol (VENTOLIN HFA) 108 (90 Base) MCG/ACT inhaler INHALE 2 PUFFS INTO THE LUNGS EVERY 6 HOURS AS NEEDED FOR WHEEZING 02/09/23  Yes Byrum, Les Pou, MD  allopurinol (ZYLOPRIM) 300 MG tablet Take 1 tablet (300 mg total) by mouth daily. 03/18/23  Yes [name], MD  ALPRAZolam Prudy Feeler) 0.5 MG tablet Take 1 tablet (0.5 mg total) by mouth 3 (three) times daily. 04/05/23  Yes [name], MD  AMBULATORY NON FORMULARY MEDICATION Medication Name: Nitroglycerin gel 0.125% apply 2-3 times daily to the anal canal. Patient taking differently: Place 1 Application rectally daily as needed (Fissure). Medication Name: Nitroglycerin gel 0.125% apply 2-3 times daily to the anal canal. 01/08/22  Yes Zehr, Princella Pellegrini, PA-C  colchicine 0.6 MG tablet Take 0.6 mg by mouth daily as needed (gout pain).   Yes [provider]  DALIRESP 500 MCG TABS tablet TAKE 1 TABLET(500 MCG) BY MOUTH DAILY 03/21/23  Yes Byrum, Les Pou, MD  diclofenac Sodium  (VOLTAREN) 1 % GEL Apply 1 Application topically daily at 12 noon.   Yes [provider]  EPINEPHrine 0.3 mg/0.3 mL IJ SOAJ injection Inject 0.3 mg into the muscle as needed for anaphylaxis. 07/13/22  Yes [name], MD  fluticasone (FLONASE) 50 MCG/ACT nasal spray SHAKE LIQUID AND USE 2 SPRAYS IN EACH NOSTRIL DAILY Patient taking differently: Place 1 spray into both nostrils daily as needed for allergies. 06/29/22  Yes Leslye Peer, MD  guaiFENesin (MUCINEX) 600 MG 12 hr tablet Take 1 tablet (600 mg total) by mouth 2 (two) times daily. Patient taking differently: Take 600 mg by mouth 2 (two) times daily as needed for cough or to loosen phlegm. 12/17/21  Yes Leslye Peer, MD  icosapent Ethyl (VASCEPA) 1 g capsule Take 2 capsules (2 g total) by mouth 2 (two) times daily. 03/19/23  Yes [name], MD  ipratropium (ATROVENT) 0.06 % nasal spray Place 2 sprays into both nostrils 4 (four) times daily as needed for rhinitis. 03/17/23  Yes Leslye Peer, MD  levofloxacin (LEVAQUIN) 750 MG tablet Take 1 tablet (750 mg total) by mouth daily for 7 days. 05/20/23 05/27/23 Yes Aubriella Perezgarcia, Eilleen Kempf, MD  lidocaine (XYLOCAINE) 5 % ointment Apply topically 3 (three) times daily as needed. Patient taking differently: Apply 1 Application topically daily as needed (Fissure). 09/15/22  Yes [name], MD  Lidocaine, Anorectal, 5 % CREA Apply 1 application topically 2 (two) times daily.  Patient taking differently: Apply 1 application  topically daily as needed (Fissure). 01/08/22  Yes Zehr, Princella Pellegrini, PA-C  loratadine (CLARITIN) 10 MG tablet Take 10 mg by mouth daily.   Yes [provider]  losartan (COZAAR) 25 MG tablet Take 1 tablet (25 mg total) by mouth daily. 01/13/23  Yes Bensimhon, Bevelyn Buckles, MD  metolazone (ZAROXOLYN) 2.5 MG tablet Take 1 tablet (2.5 mg total) by mouth as directed. Patient taking differently: Take 2.5 mg by mouth daily as needed (swelling). 01/13/23  Yes Bensimhon,  Bevelyn Buckles, MD  mometasone-formoterol (DULERA) 100-5 MCG/ACT AERO Inhale 2 puffs into the lungs 2 (two) times daily. 12/10/22  Yes Leslye Peer, MD  montelukast (SINGULAIR) 5 MG chewable tablet Chew 2 tablets (10 mg total) by mouth at bedtime. 05/19/23  Yes Leslye Peer, MD  nicotine (NICODERM CQ - DOSED IN MG/24 HOURS) 14 mg/24hr patch Place 1 patch (14 mg total) onto the skin daily. 05/22/22  Yes Leslye Peer, MD  nystatin (MYCOSTATIN) 100000 UNIT/ML suspension Take 5 mLs (500,000 Units total) by mouth in the morning, at noon, and at bedtime. 05/19/23  Yes Leslye Peer, MD  omeprazole (PRILOSEC) 20 MG capsule TAKE 1 CAPSULE(20 MG) BY MOUTH TWICE DAILY BEFORE A MEAL FOR ACID REFLUX 04/28/23  Yes [name], MD  potassium chloride SA (KLOR-CON M20) 20 MEQ tablet Take 1 tablet (20 mEq total) by mouth daily. Take an extra 40 meq when you take metolazone 01/18/23  Yes Bensimhon, Bevelyn Buckles, MD  pravastatin (PRAVACHOL) 10 MG tablet Take 1 tablet (10 mg total) by mouth daily. 03/19/23  Yes [name], MD  predniSONE (DELTASONE) 10 MG tablet TAKE 1 TABLET BY MOUTH EVERY DAY WITH BREAKFAST 04/28/23  Yes [name], MD  Semaglutide (RYBELSUS) 3 MG TABS Take 1 tablet (3 mg total) by mouth daily. 03/17/23  Yes [name], MD  Testosterone 20.25 MG/1.25GM (1.62%) GEL Place 1.25 g onto the skin daily. Patient taking differently: Place 1 Pump onto the skin every other day. 06/29/22  Yes [name], MD  Tiotropium Bromide Monohydrate (SPIRIVA RESPIMAT) 2.5 MCG/ACT AERS Inhale 2 puffs into the lungs daily. Patient taking differently: Inhale 1 puff into the lungs 2 (two) times daily. 12/10/22  Yes Leslye Peer, MD  torsemide (DEMADEX) 20 MG tablet Take 1 tablet (20 mg total) by mouth 2 (two) times daily. 04/28/23  Yes [name], MD  vitamin B-12 (CYANOCOBALAMIN) 1000 MCG tablet Take 1,000 mcg by mouth daily.   Yes [provider]    Allergies  Allergen Reactions   Bee Venom  Shortness Of Breath and Swelling   Cefdinir Other (See Comments)    Patient states he couldn't really breath    Spironolactone Anaphylaxis   Daliresp [Roflumilast] Other (See Comments)    Dizzy, headache, leg pain per patient. 02/25/22. Tolerates in low doses    Jardiance [Empagliflozin] Nausea Only and Other (See Comments)    Lightheadness Dizziness   Penicillins Swelling and Other (See Comments)    Childhood rxn--MD stated he "almost died" Has patient had a PCN reaction causing immediate rash, facial/tongue/throat swelling, SOB or lightheadedness with hypotension:Yes Has patient had a PCN reaction causing severe rash involving mucus membranes or skin necrosis:unsure Has patient had a PCN reaction that required hospitalization:unsure Has patient had a PCN reaction occurring within the last 10 years:No If all of the above answers are "NO", then may proceed with Cephalosporin use.  Clindamycin Other (See Comments)    Tongue swelling   Doxycycline Nausea And Vomiting    Severe stomach upset per patient   E-Mycin [Erythromycin Base] Swelling   Rosuvastatin Other (See Comments)    Myalgias (intolerance)    Patient Active Problem List   Diagnosis Date Noted   Cellulitis of left lower leg 04/28/2023   Oral thrush 04/28/2023   Screen for colon cancer 03/21/2023   Alcoholic cirrhosis of liver without ascites (HCC) 03/19/2023   Need for prophylactic vaccination with combined diphtheria-tetanus-pertussis (DTP) vaccine 03/18/2023   Bilateral plantar fasciitis 01/04/2023   Cervical radiculopathy 01/03/2023   Bilateral primary osteoarthritis of knee 06/30/2022   Prostate cancer screening 06/29/2022   Hypogonadism in male 06/07/2022   Chronic respiratory failure with hypoxia (HCC) 11/24/2021   Encounter for general adult medical examination with abnormal findings 08/20/2021   GAD (generalized anxiety disorder) 07/04/2021   Degenerative disc disease, cervical 04/21/2021   Carotid  artery calcification 04/09/2021   Panlobular emphysema (HCC) 03/25/2021   Atherosclerosis of aorta (HCC) 03/25/2021   Olecranon bursitis of left elbow 02/08/2021   Body mass index (BMI) 32.0-32.9, adult 01/15/2021   Lumbar spondylosis 01/10/2021   Foraminal stenosis of cervical region 01/10/2021   Chronic bilateral low back pain without sciatica 01/09/2021   Type II diabetes mellitus with manifestations (HCC) 11/29/2020   Primary osteoarthritis of both knees 09/24/2020   Idiopathic chronic gout of foot without tophus 08/08/2020   Yellow jacket sting allergy 08/08/2020   Class 2 severe obesity due to excess calories with serious comorbidity and body mass index (BMI) of 38.0 to 38.9 in adult (HCC) 08/05/2020   Hyperlipidemia LDL goal <70 07/11/2020   Hypertriglyceridemia 07/08/2020   Chronic combined systolic and diastolic CHF (congestive heart failure) (HCC) 01/09/2020   Solitary pulmonary nodule 08/15/2019   NICM (nonischemic cardiomyopathy) (HCC) 12/16/2018   Cigarette smoker 01/16/2015   COPD (chronic obstructive pulmonary disease) (HCC) 06/15/2014   Obstructive sleep apnea syndrome 01/05/2013   Asbestos exposure 01/05/2013   Seasonal allergic rhinitis 10/06/2012   GERD (gastroesophageal reflux disease) 06/05/2012   Intrinsic asthma 07/23/2011   HTN (hypertension) 07/23/2011    Past Medical History:  Diagnosis Date   Adenomatous colon polyp    Allergy    Anal fissure    Arthritis    Asthma    Bronchitis    CHF (congestive heart failure) (HCC)    COPD, group D, by GOLD 2017 classification (HCC)    Dyspnea    Emphysema lung (HCC)    GERD (gastroesophageal reflux disease)    History of hiatal hernia    Hyperlipidemia    Hypertension    NICM (nonischemic cardiomyopathy) (HCC)    OSA (obstructive sleep apnea)    Pneumonia    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   Requires supplemental oxygen    Sinusitis    SOB (shortness of breath)     Past Surgical  History:  Procedure Laterality Date   BIV UPGRADE N/A 10/07/2021   Procedure: BIV PPM UPGRADE;  Surgeon: Regan Lemming, MD;  Location: [facility] INVASIVE CV [facility];  Service: Cardiovascular;  Laterality: N/A;   BIV UPGRADE N/A 11/11/2022   Procedure: BIV PPM UPGRADE;  Surgeon: Regan Lemming, MD;  Location: [facility];  Service: Cardiovascular;  Laterality: N/A;   LEFT HEART CATH AND CORONARY ANGIOGRAPHY N/A 04/19/2017   Procedure: Left Heart Cath and Coronary Angiography;  Surgeon: Lyn Records, MD;  [facility]  INVASIVE CV LAB;  Service: Cardiovascular;  Laterality: N/A;   PACEMAKER IMPLANT N/A 04/16/2021   Procedure: PACEMAKER IMPLANT;  Surgeon: Regan Lemming, MD;  Location: [facility] INVASIVE CV [facility];  Service: Cardiovascular;  Laterality: N/A;   RIGHT HEART CATH N/A 03/09/2023   Procedure: RIGHT HEART CATH;  Surgeon: Dolores Patty, MD;  Location: [facility];  Service: Cardiovascular;  Laterality: N/A;   RIGHT/LEFT HEART CATH AND CORONARY ANGIOGRAPHY N/A 08/22/2021   Procedure: RIGHT/LEFT HEART CATH AND CORONARY ANGIOGRAPHY;  Surgeon: Dolores Patty, MD;  Location: [facility] IN[facility]ASIVE [facility];  Service: Cardiovascular;  Laterality: N/A;   TONSILLECTOMY      Social History   Socioeconomic History   Marital status: Single    Spouse name: Not on file   Number of children: 1   Years of education: 60   Highest education level: 12th grade  Occupational History   Occupation: disabled  Tobacco Use   Smoking status: Some Days    Packs/day: 2.00    Years: 46.00    Additional pack years: 0.00    Total pack years: 92.00    Types: Cigarettes    Last attempt to quit: 01/27/2022    Years since quitting: 1.3    Passive exposure: Past   Smokeless tobacco: Never   Tobacco comments:    Had 3 cigarettes in past 2 weeks ARJ 05/18/23  Vaping Use   Vaping Use: Never used  Substance and Sexual Activity   Alcohol use: Not Currently    Alcohol/week: 28.0 standard drinks of alcohol     Types: 28 Cans of beer per week   Drug use: No   Sexual activity: Yes    Partners: Female    Birth control/protection: None  Other Topics Concern   Not on file  Social History Narrative   Right handed   Tea sometimes and coffee (1/2 caffeine)   Social Determinants of Health   Financial Resource Strain: Medium Risk (03/15/2023)   Overall Financial Resource Strain (CARDIA)    Difficulty of Paying Living Expenses: Somewhat hard  Food Insecurity: Unknown (03/15/2023)   Hunger Vital Sign    Worried About Running Out of Food in the Last Year: Patient declined    Ran Out of Food in the Last Year: Never true  Transportation Needs: No Transportation Needs (03/15/2023)   PRAPARE - Administrator, Civil Service (Medical): No    Lack of Transportation (Non-Medical): No  Physical Activity: Unknown (03/15/2023)   Exercise Vital Sign    Days of Exercise per Week: 0 days    Minutes of Exercise per Session: Not on file  Stress: Stress Concern Present (03/15/2023)   [employer] of Occupational Health - Occupational Stress Questionnaire    Feeling of Stress : Rather much  Social Connections: Unknown (03/15/2023)   Social Connection and Isolation Panel [NHANES]    Frequency of Communication with Friends and Family: More than three times a week    Frequency of Social Gatherings with Friends and Family: Twice a week    Attends Religious Services: Patient declined    Database administrator or Organizations: No    Attends Engineer, structural: Not on file    Marital Status: Divorced  Catering manager Violence: Not on file    Family History  Problem Relation Age of Onset   Lung cancer Father    High blood pressure Mother    Diabetes Maternal Grandmother    Colon polyps Neg Hx  Esophageal cancer Neg Hx    Pancreatic cancer Neg Hx    Stomach cancer Neg Hx      Review of Systems  Constitutional: Negative.  Negative for chills and fever.  HENT: Negative.  Negative  for congestion and sore throat.   Respiratory: Negative.  Negative for cough and shortness of breath.   Cardiovascular: Negative.  Negative for chest pain and palpitations.  Gastrointestinal:  Negative for abdominal pain, diarrhea, nausea and vomiting.  Genitourinary: Negative.   Musculoskeletal: Negative.   Skin:  Positive for rash.  Neurological: Negative.  Negative for dizziness and headaches.  All other systems reviewed and are negative.   Vitals:   05/20/23 1537  BP: 132/76  Pulse: 95  Temp: 98 F (36.7 C)  SpO2: 91%    Physical Exam Vitals reviewed.  Constitutional:      Appearance: Normal appearance.  HENT:     Head: Normocephalic.  Eyes:     Extraocular Movements: Extraocular movements intact.  Cardiovascular:     Rate and Rhythm: Normal rate.  Pulmonary:     Effort: Pulmonary effort is normal.  Musculoskeletal:        General: Normal range of motion.  Skin:    General: Skin is warm and dry.     Comments: Left lower extremity: Positive erythema and swelling compatible with cellulitis Nontender cyst left upper forearm  Neurological:     Mental Status: He is alert and oriented to person, place, and time.  Psychiatric:        Mood and Affect: Mood normal.        Behavior: Behavior normal.      ASSESSMENT & PLAN: A total of 32 minutes was spent with the patient and counseling/coordination of care regarding preparing for this visit, review of most recent office visit notes, review of chronic medical conditions under management, review of all medications, review of multiple antibiotic allergies, diagnosis of cellulitis and need to start antibiotics, and need for follow-up with dermatologist, prognosis, documentation and need for follow-up.  Problem List Items Addressed This Visit       Other   Cellulitis of left lower leg - Primary    Persistent cellulitis. Recommend to start second round of antibiotic. Allergic to multiple antibiotics Recommend  levofloxacin 750 mg daily for 7 days Has appointment with dermatologist next week      Relevant Medications   levofloxacin (LEVAQUIN) 750 MG tablet   Cyst, dermoid, arm, left    Chronic, nontender, no complications.      Patient Instructions  Cellulitis, Adult  Cellulitis is a skin infection. The infected area is often warm, red, swollen, and sore. It occurs most often on the legs, feet, and toes, but can happen on any part of the body. This condition can be life-threatening without treatment. It is very important to get treated right away. What are the causes? This condition is caused by bacteria. The bacteria enter through a break in the skin, such as: A cut. A burn. A bug bite. An animal bite. An open sore. A crack. What increases the risk? Having a weak body's defense system (immune system). Being older than 65 years old. Having a blood sugar problem (diabetes). Having a long-term liver disease (cirrhosis) or kidney disease. Being very overweight (obese). Having a skin problem, such as: An itchy rash. A rash caused by a fungus. A rash with blisters. Slow movement of blood in the veins (venous stasis). Fluid buildup below the skin (edema). This condition is  more likely to occur in people who: Have open cuts, burns, bites, or scrapes on the skin. Have been treated with high-energy rays (radiation). Use IV drugs. What are the signs or symptoms? Skin that: Looks red or purple, or slightly darker than your usual skin color. Has streaks. Has spots. Is swollen. Is sore or painful when you touch it. Is warm. A fever. Chills. Blisters. Tiredness (fatigue). How is this treated? Medicines to treat infections or allergies. Rest. Placing cold or warm cloths on the skin. Staying in the hospital, if the condition is very bad. You may need medicines through an IV. Follow these instructions at home: Medicines Take over-the-counter and prescription medicines only as told  by your doctor. If you were prescribed antibiotics, take them as told by your doctor. Do not stop using them even if you start to feel better. General instructions Drink enough fluid to keep your pee (urine) pale yellow. Do not touch or rub the infected area. Raise (elevate) the infected area above the level of your heart while you are sitting or lying down. Return to your normal activities when your doctor says that it is safe. Place cold or warm cloths on the area as told by your doctor. Keep all follow-up visits. Your doctor will need to make sure that a more serious infection is not developing. Contact a doctor if: You have a fever. You do not start to get better after 1-2 days of treatment. Your bone or joint under the infected area starts to hurt after the skin has healed. Your infection comes back in the same area or another area. Signs of this may include: You have a swollen bump in the area. Your red area gets larger, turns dark in color, or hurts more. You have more fluid coming from the wound. Pus or a bad smell develops in your infected area. You have more pain. You feel sick and have muscle aches and weakness. You develop vomiting or watery poop that will not go away. Get help right away if: You see red streaks coming from the area. You notice the skin turns purple or black and Church off. These symptoms may be an emergency. Get help right away. Call 911. Do not wait to see if the symptoms will go away. Do not drive yourself to the hospital. This information is not intended to replace advice given to you by your health care provider. Make sure you discuss any questions you have with your health care provider. Document Revised: 08/04/2022 Document Reviewed: 08/04/2022 Elsevier Patient Education  2024 Elsevier Inc.      Edwina Barth, MD [facility]

## 2023-05-20 NOTE — Telephone Encounter (Signed)
Patient called stating he had a lot of swelling in feet and ankles. Shortness of breath, fluctuating weight gain.  He is being treated for cellulitis in one leg, but he is not taking his fluid pills regularly because he has to go out and do things. Please advise on any changes

## 2023-05-20 NOTE — Telephone Encounter (Signed)
LMOM and mychart message sent.

## 2023-05-20 NOTE — Assessment & Plan Note (Signed)
Persistent cellulitis. Recommend to start second round of antibiotic. Allergic to multiple antibiotics Recommend levofloxacin 750 mg daily for 7 days Has appointment with dermatologist next week

## 2023-05-20 NOTE — Assessment & Plan Note (Signed)
Chronic, nontender, no complications.

## 2023-05-24 DIAGNOSIS — M9901 Segmental and somatic dysfunction of cervical region: Secondary | ICD-10-CM | POA: Diagnosis not present

## 2023-05-24 DIAGNOSIS — M5137 Other intervertebral disc degeneration, lumbosacral region: Secondary | ICD-10-CM | POA: Diagnosis not present

## 2023-05-24 DIAGNOSIS — M5032 Other cervical disc degeneration, mid-cervical region, unspecified level: Secondary | ICD-10-CM | POA: Diagnosis not present

## 2023-05-24 DIAGNOSIS — M9905 Segmental and somatic dysfunction of pelvic region: Secondary | ICD-10-CM | POA: Diagnosis not present

## 2023-05-24 DIAGNOSIS — M5134 Other intervertebral disc degeneration, thoracic region: Secondary | ICD-10-CM | POA: Diagnosis not present

## 2023-05-24 DIAGNOSIS — M9902 Segmental and somatic dysfunction of thoracic region: Secondary | ICD-10-CM | POA: Diagnosis not present

## 2023-05-25 ENCOUNTER — Other Ambulatory Visit: Payer: Medicare Other

## 2023-05-26 ENCOUNTER — Telehealth: Payer: Self-pay | Admitting: Internal Medicine

## 2023-05-26 ENCOUNTER — Other Ambulatory Visit: Payer: Medicare Other

## 2023-05-26 ENCOUNTER — Telehealth: Payer: Self-pay | Admitting: Student

## 2023-05-26 ENCOUNTER — Telehealth: Payer: Self-pay | Admitting: Emergency Medicine

## 2023-05-26 DIAGNOSIS — M5136 Other intervertebral disc degeneration, lumbar region: Secondary | ICD-10-CM | POA: Diagnosis not present

## 2023-05-26 DIAGNOSIS — M9902 Segmental and somatic dysfunction of thoracic region: Secondary | ICD-10-CM | POA: Diagnosis not present

## 2023-05-26 DIAGNOSIS — M5134 Other intervertebral disc degeneration, thoracic region: Secondary | ICD-10-CM | POA: Diagnosis not present

## 2023-05-26 DIAGNOSIS — M9903 Segmental and somatic dysfunction of lumbar region: Secondary | ICD-10-CM | POA: Diagnosis not present

## 2023-05-26 NOTE — Telephone Encounter (Signed)
Called patient back; it is hard to know what exactly is causing the sob. He reports having sob before Monday anyway; but it has gotten worse. Informed him that I am unsure about Chiropractor causing the sob? He reports that he has another appt with them today at 4:30. Told him to speak with the chiropractor about the symptoms he is experiencing and to also speak with his PCP about his s/s to see if they have any recommendations?  Appt made for 06/02/23 at 2:45 with Edd Fabian, NP. Given ER precautions and told that I would send this information to his provider as well. He verbalized understanding.

## 2023-05-26 NOTE — Telephone Encounter (Signed)
Patient is on the lab schedule this afternoon but there are no orders in the chart. Please advise.

## 2023-05-26 NOTE — Telephone Encounter (Signed)
Pt c/o Shortness Of Breath: STAT if SOB developed within the last 24 hours or pt is noticeably SOB on the phone  1. Are you currently SOB (can you hear that pt is SOB on the phone)?  a  little 2. How long have you been experiencing SOB?  Monday(05-24-23) after he went  to the Chiropractor  3. Are you SOB when sitting or when up moving around?  Both a 4.  Are you currently experiencing any other symptoms? No- patient wanted to be seen- first appointment I could get for him was 06-28-23 with Arliss Journey

## 2023-05-26 NOTE — Telephone Encounter (Signed)
Patient is returning a call.  Please call patient back at [phone]

## 2023-05-26 NOTE — Telephone Encounter (Signed)
Called and spoke with patient. He stated that he picked up the chewable montelukast after his last visit and has been only taking 1/2 of the dose. He stated that about 25mins-1hr later he noticed that it was difficult for him to swallow. He took some Benadryl and his throat felt better. He tried the 2.5mg  dose again the next day and noticed the same reaction. He has been off of the medication for a few days now and he feels much better. I advised him to not take anymore, he verbalized understanding.   RB, can you please advise? Thanks!

## 2023-05-26 NOTE — Telephone Encounter (Signed)
Patient states Montelukast does not work. Pharmacy is [facility] [address]. Patient phone number is [phone].

## 2023-05-26 NOTE — Telephone Encounter (Signed)
Called patient but he did not answer. Left message for him to call us back.  

## 2023-05-26 NOTE — Telephone Encounter (Signed)
Went to chiropractor Monday and ever since then, he has been short of breath.   He has not been able to use any of his asthma medications due to allergic reaction to Montelucast- it caused burning to mouth, throat, stomach; and now every med he has been tried on for asthma has caused a reaction as well.  He is unsure if this sob has to do with not being able to take his asthma meds- except his rescue inhaler or if maybe at his chiropractic appt that a disc was "dislodged" and is pressing on something and causing some sob?  He got another call from [facility] on the other line. I told him that I will call him back  in a bit, to go ahead and answer that call.

## 2023-05-27 ENCOUNTER — Other Ambulatory Visit: Payer: Self-pay | Admitting: Internal Medicine

## 2023-05-27 ENCOUNTER — Other Ambulatory Visit (INDEPENDENT_AMBULATORY_CARE_PROVIDER_SITE_OTHER): Payer: Medicare Other

## 2023-05-27 DIAGNOSIS — M1A0790 Idiopathic chronic gout, unspecified ankle and foot, without tophus (tophi): Secondary | ICD-10-CM

## 2023-05-27 DIAGNOSIS — E118 Type 2 diabetes mellitus with unspecified complications: Secondary | ICD-10-CM | POA: Diagnosis not present

## 2023-05-27 LAB — URIC ACID: Uric Acid, Serum: 10.9 mg/dL — ABNORMAL HIGH (ref 4.0–7.8)

## 2023-05-27 NOTE — Telephone Encounter (Signed)
Please ensure he is using his Trilogy ventilator at night. Ensure he is using his maintenance inhalers. Recommend he use his neb before bed. Can use up to 4 times a day. Increase prednisone to 40 mg for 5 days then return to 10 mg daily. Take in AM with food. Also, any swelling in legs? Sometimes these symptoms are related to the heart and fluid overload. If no improvement in symptoms with above measures, needs OV.

## 2023-05-27 NOTE — Telephone Encounter (Signed)
Called and spoke with patient, he states that he has not been able to take any form of montelukast since he had an allergic reaction to bactrim he was taking for cellulitis.  He is unable to sleep at night, he sleeps for 2-3 hours and then he is up because he cannot catch his breath, he takes 2 puffs of his albuterol and then he sleeps for a couple of more hours and then he is back up and has to use his inhaler.  He was taking his montelukast at night and now he cannot sleep because of his breathing.  I advised him that Dr. Delton Coombes is out of the office this week and will not be reachable until Monday and he will be doing procedures on Monday.  He is wondering if there is something else that can be called in to take it's place.  I let him know that I would send a message to our NP taking calls today and once we get a recommendation, we will call him back.  He said he has to go have blood drawn so if we cannot get him that is why.  If something is sent in, he would like it sent to [facility] on [address].  [place], please advise.  Thank you.

## 2023-05-27 NOTE — Telephone Encounter (Signed)
ATC X1 LVM for patient to give the office a call back 

## 2023-05-27 NOTE — Telephone Encounter (Signed)
Patient called again regarding his medication.  Stated he still has not heard anything from the nurse or doctor.  Please advise and call patient to discuss at [phone]

## 2023-05-28 ENCOUNTER — Telehealth: Payer: Self-pay

## 2023-05-28 ENCOUNTER — Other Ambulatory Visit: Payer: Self-pay

## 2023-05-28 ENCOUNTER — Other Ambulatory Visit: Payer: Self-pay | Admitting: Emergency Medicine

## 2023-05-28 DIAGNOSIS — J449 Chronic obstructive pulmonary disease, unspecified: Secondary | ICD-10-CM | POA: Diagnosis not present

## 2023-05-28 DIAGNOSIS — J9610 Chronic respiratory failure, unspecified whether with hypoxia or hypercapnia: Secondary | ICD-10-CM | POA: Diagnosis not present

## 2023-05-28 LAB — HEMOGLOBIN A1C: Hgb A1c MFr Bld: 6 % (ref 4.6–6.5)

## 2023-05-28 MED ORDER — PREDNISONE 10 MG PO TABS: 40.0000 mg | ORAL_TABLET | Freq: Every day | ORAL | 0 refills | Status: DC

## 2023-05-28 MED ORDER — SPIRIVA RESPIMAT 2.5 MCG/ACT IN AERS: 2.0000 | INHALATION_SPRAY | Freq: Every day | RESPIRATORY_TRACT | 6 refills | Status: DC

## 2023-05-28 NOTE — Telephone Encounter (Signed)
PT ret call. I read PA's message. He states we need to call in more Pred for him please.  PHARM IS [facility] on [address]   He expressed understanding and I sent Ms. Cobb's message to his my chart.  [phone] is his #

## 2023-05-28 NOTE — Telephone Encounter (Signed)
Left detailed message to let him know the additional prednisone has been sent in for him.

## 2023-05-28 NOTE — Addendum Note (Signed)
Addended by: Maurene Capes on: 05/28/2023 11:42 AM   Modules accepted: Orders

## 2023-05-28 NOTE — Telephone Encounter (Signed)
Spoke to pt and let him know we sent in prednisone for him and pt asked for a refill on Tiotropium Bromide Monohydrate (SPIRIVA RESPIMAT) 2.5 MCG/ACT AERS so I sent that into the pharmacy too. Pt verbalized understanding nothing further needed.

## 2023-05-29 DIAGNOSIS — J449 Chronic obstructive pulmonary disease, unspecified: Secondary | ICD-10-CM | POA: Diagnosis not present

## 2023-05-30 NOTE — Progress Notes (Deleted)
Cardiology Clinic Note   Patient Name: Robert Church Date of Encounter: 05/30/2023  Primary Care Provider:  [name], MD Primary Cardiologist:  Nanetta Batty, MD  Patient Profile    Robert Church 67 year old male presents to the clinic today for follow-up evaluation of his nonischemic cardiomyopathy and hypertension.   Past Medical History    Past Medical History:  Diagnosis Date   Adenomatous colon polyp    Allergy    Anal fissure    Arthritis    Asthma    Bronchitis    CHF (congestive heart failure) (HCC)    COPD, group D, by GOLD 2017 classification (HCC)    Dyspnea    Emphysema lung (HCC)    GERD (gastroesophageal reflux disease)    History of hiatal hernia    Hyperlipidemia    Hypertension    NICM (nonischemic cardiomyopathy) (HCC)    OSA (obstructive sleep apnea)    Pneumonia    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   Requires supplemental oxygen    Sinusitis    SOB (shortness of breath)    Past Surgical History:  Procedure Laterality Date   BIV UPGRADE N/A 10/07/2021   Procedure: BIV PPM UPGRADE;  Surgeon: Regan Lemming, MD;  Location: [facility];  Service: Cardiovascular;  Laterality: N/A;   BIV UPGRADE N/A 11/11/2022   Procedure: BIV PPM UPGRADE;  Surgeon: Regan Lemming, MD;  Location: [facility];  Service: Cardiovascular;  Laterality: N/A;   LEFT HEART CATH AND CORONARY ANGIOGRAPHY N/A 04/19/2017   Procedure: Left Heart Cath and Coronary Angiography;  Surgeon: Lyn Records, MD;  Location: [facility]VASIVE CV LAB;  Service: Cardiovascular;  Laterality: N/A;   PACEMAKER IMPLANT N/A 04/16/2021   Procedure: PACEMAKER IMPLANT;  Surgeon: Regan Lemming, MD;  Location: [facility];  Service: Cardiovascular;  Laterality: N/A;   RIGHT HEART CATH N/A 03/09/2023   Procedure: RIGHT HEART CATH;  Surgeon: Dolores Patty, MD;  Location: [facility];  Service: Cardiovascular;  Laterality: N/A;    RIGHT/LEFT HEART CATH AND CORONARY ANGIOGRAPHY N/A 08/22/2021   Procedure: RIGHT/LEFT HEART CATH AND CORONARY ANGIOGRAPHY;  Surgeon: Dolores Patty, MD;  Location: [facility];  Service: Cardiovascular;  Laterality: N/A;   TONSILLECTOMY      Allergies  Allergies  Allergen Reactions   Bee Venom Shortness Of Breath and Swelling   Cefdinir Other (See Comments)    Patient states he couldn't really breath    Spironolactone Anaphylaxis   Daliresp [Roflumilast] Other (See Comments)    Dizzy, headache, leg pain per patient. 02/25/22. Tolerates in low doses    Jardiance [Empagliflozin] Nausea Only and Other (See Comments)    Lightheadness Dizziness   Penicillins Swelling and Other (See Comments)    Childhood rxn--MD stated he "almost died" Has patient had a PCN reaction causing immediate rash, facial/tongue/throat swelling, SOB or lightheadedness with hypotension:Yes Has patient had a PCN reaction causing severe rash involving mucus membranes or skin necrosis:unsure Has patient had a PCN reaction that required hospitalization:unsure Has patient had a PCN reaction occurring within the last 10 years:No If all of the above answers are "NO", then may proceed with Cephalosporin use.     Clindamycin Other (See Comments)    Tongue swelling   Doxycycline Nausea And Vomiting    Severe stomach upset per patient   E-Mycin [Erythromycin Base] Swelling   Rosuvastatin Other (See  Comments)    Myalgias (intolerance)    History of Present Illness    Robert Church has a PMH of HTN, NICM, chronic combined systolic and diastolic CHF, aortic atherosclerosis, carotid artery calcification, third-degree AV block, asthma, GERD, type 2 diabetes, tobacco abuse, HLD, and generalized anxiety disorder.  Underwent cardiac catheterization 4/18 due to chest pain.  He was noted to have normal coronary anatomy and EF of 40% with global hypokinesis and LVEDP of 19.  His cardiac CT 9/19 showed a calcium score of  148, LAD 25% stenosis and otherwise normal coronary Arteries.  He was noted to have dilated pulmonary arteries suggesting PAH.  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   He was seen by Robert Church 7/19 due to recurrent chest pain.  He underwent nuclear stress testing 8/19 which showed an EF 38% and inferior scar and septal ischemia his echocardiogram at that time showed an EF of 30-35%.   He was seen in follow-up by Dr. Gala Romney on 04/09/2022.  He had been admitted with acute on chronic hypoxic respiratory failure secondary to acute COPD and CHF with fluid volume overload 1/22.  He underwent significant IV diuresis.  Admission weight 265 pounds and discharge weight was 210 pounds.  His echocardiogram 12/21 showed an EF of 40-45%.   He followed up in the advanced heart failure clinic.  Jardiance and spironolactone were added to his medication regimen.  His doses were adjusted due to medication intolerance.  He then stopped the medications on his own.  He wore a cardiac event monitor 4/22 due to near syncope.  He was noted to have complete heart block.  He received a Retail [employer] dual-chamber pacemaker 04/16/2021.   During his follow-up with advanced heart failure clinic 04/09/2022.  He reported he was feeling okay.  He has been working in his garden some.  He was having to take frequent breaks.  He denied chest pain and lower extremity swelling.  He denied orthopnea and PND.  He had not needed to take metolazone.  He is not taking losartan.  He continues to smoke 1-2 cigarettes/day.   Plan BiV PPM upgrade 11/10/2022.   He presents to the clinic today for follow-up evaluation and states this weekend while he was getting out of his car he noticed some sharp chest pain on the left side of his chest.  Occasionally over the last few days he has noticed sharp chest pains.  We reviewed his most recent visits with cardiology and the advanced heart failure clinic.  He reported understanding.  His EKG today  shows V paced rhythm with significant artifact around 95 bpm.  We reviewed his echocardiogram 8/22.  His chest discomfort is reproducible with deep palpation.  I explained that his pain is musculoskeletal in nature.  He expressed understanding.  We will plan follow-up in 6 to 9 months.  He reports that since he has had pneumonia earlier this year his breathing has not returned to its baseline.   Today he denies increased shortness of breath, lower extremity edema, fatigue, palpitations, melena, hematuria, hemoptysis, diaphoresis, weakness, presyncope, syncope, orthopnea, and PND.   Chest wall pain-pain reproducible with deep palpation.  Patient reassured that his pain is not related to cardiac issues.  He expressed understanding. Rest area Use cool and warm compresses Slowly increase physical activity   Chronic combined systolic and diastolic CHF-euvolemic.  Weight stable.  No increased DOE or activity intolerance.  NYHA class II-3. Continue losartan, torsemide Heart healthy  low-sodium diet-salty 6 given   Essential hypertension-BP today 100/70 Continue losartan Heart healthy low-sodium diet-salty 6 given Increase physical activity as tolerated   OSA-reports compliance with CPAP.  Waking up well rested. Continue CPAP use Continue weight loss   Morbid obesity-working on increasing physical activity and decreasing caloric intake. Continue weight loss   COPD-no increased DOE.  Smoking cessation strongly recommended. Continues to use 1-2 cigarettes/day. Follows with pulmonology   Disposition: Follow-up with Dr.Berry or me in 4-6 months.   Home Medications    Prior to Admission medications   Medication Sig Start Date End Date Taking? Authorizing Provider  albuterol (PROVENTIL) (2.5 MG/3ML) 0.083% nebulizer solution Take 3 mLs (2.5 mg total) by nebulization every 4 (four) hours as needed for wheezing or shortness of breath. 11/26/22   Leslye Peer, MD  albuterol (VENTOLIN HFA) 108  (90 Base) MCG/ACT inhaler INHALE 2 PUFFS INTO THE LUNGS EVERY 6 HOURS AS NEEDED FOR WHEEZING 02/09/23   Leslye Peer, MD  allopurinol (ZYLOPRIM) 300 MG tablet Take 1 tablet (300 mg total) by mouth daily. 03/18/23   [name], MD  ALPRAZolam Prudy Feeler) 0.5 MG tablet Take 1 tablet (0.5 mg total) by mouth 3 (three) times daily. 04/05/23   [name], MD  AMBULATORY NON FORMULARY MEDICATION Medication Name: Nitroglycerin gel 0.125% apply 2-3 times daily to the anal canal. Patient taking differently: Place 1 Application rectally daily as needed (Fissure). Medication Name: Nitroglycerin gel 0.125% apply 2-3 times daily to the anal canal. 01/08/22   Zehr, Princella Pellegrini, PA-C  colchicine 0.6 MG tablet Take 0.6 mg by mouth daily as needed (gout pain).    [provider]  DALIRESP 500 MCG TABS tablet TAKE 1 TABLET(500 MCG) BY MOUTH DAILY 03/21/23   Leslye Peer, MD  diclofenac Sodium (VOLTAREN) 1 % GEL Apply 1 Application topically daily at 12 noon.    [provider]  EPINEPHrine 0.3 mg/0.3 mL IJ SOAJ injection Inject 0.3 mg into the muscle as needed for anaphylaxis. 07/13/22   [name], MD  fluticasone (FLONASE) 50 MCG/ACT nasal spray SHAKE LIQUID AND USE 2 SPRAYS IN EACH NOSTRIL DAILY Patient taking differently: Place 1 spray into both nostrils daily as needed for allergies. 06/29/22   Leslye Peer, MD  guaiFENesin (MUCINEX) 600 MG 12 hr tablet Take 1 tablet (600 mg total) by mouth 2 (two) times daily. Patient taking differently: Take 600 mg by mouth 2 (two) times daily as needed for cough or to loosen phlegm. 12/17/21   Leslye Peer, MD  icosapent Ethyl (VASCEPA) 1 g capsule Take 2 capsules (2 g total) by mouth 2 (two) times daily. 03/19/23   [name], MD  ipratropium (ATROVENT) 0.06 % nasal spray Place 2 sprays into both nostrils 4 (four) times daily as needed for rhinitis. 03/17/23   Leslye Peer, MD  lidocaine (XYLOCAINE) 5 % ointment Apply topically 3 (three)  times daily as needed. Patient taking differently: Apply 1 Application topically daily as needed (Fissure). 09/15/22   [name], MD  Lidocaine, Anorectal, 5 % CREA Apply 1 application topically 2 (two) times daily. Patient taking differently: Apply 1 application  topically daily as needed (Fissure). 01/08/22   Zehr, Princella Pellegrini, PA-C  loratadine (CLARITIN) 10 MG tablet Take 10 mg by mouth daily.    [provider]  losartan (COZAAR) 25 MG tablet Take 1 tablet (25 mg total) by mouth daily. 01/13/23   Bensimhon, Bevelyn Buckles, MD  metolazone (ZAROXOLYN) 2.5 MG tablet Take 1 tablet (2.5 mg total) by mouth as directed. Patient taking differently: Take 2.5 mg by mouth daily as needed (swelling). 01/13/23   Bensimhon, Bevelyn Buckles, MD  mometasone-formoterol (DULERA) 100-5 MCG/ACT AERO Inhale 2 puffs into the lungs 2 (two) times daily. 12/10/22   Leslye Peer, MD  montelukast (SINGULAIR) 5 MG chewable tablet Chew 2 tablets (10 mg total) by mouth at bedtime. 05/19/23   Leslye Peer, MD  nicotine (NICODERM CQ - DOSED IN MG/24 HOURS) 14 mg/24hr patch Place 1 patch (14 mg total) onto the skin daily. 05/22/22   Leslye Peer, MD  nystatin (MYCOSTATIN) 100000 UNIT/ML suspension Take 5 mLs (500,000 Units total) by mouth in the morning, at noon, and at bedtime. 05/19/23   Leslye Peer, MD  omeprazole (PRILOSEC) 20 MG capsule TAKE 1 CAPSULE(20 MG) BY MOUTH TWICE DAILY BEFORE A MEAL FOR ACID REFLUX 04/28/23   [name], MD  potassium chloride SA (KLOR-CON M20) 20 MEQ tablet Take 1 tablet (20 mEq total) by mouth daily. Take an extra 40 meq when you take metolazone 01/18/23   Bensimhon, Bevelyn Buckles, MD  pravastatin (PRAVACHOL) 10 MG tablet Take 1 tablet (10 mg total) by mouth daily. 03/19/23   [name], MD  predniSONE (DELTASONE) 10 MG tablet TAKE 1 TABLET BY MOUTH EVERY DAY WITH BREAKFAST 04/28/23   [name], MD  predniSONE (DELTASONE) 10 MG tablet Take 4 tablets (40 mg total) by mouth daily with  breakfast for 5 days. 05/28/23 06/02/23  Cobb, Ruby Cola, NP  Semaglutide (RYBELSUS) 3 MG TABS Take 1 tablet (3 mg total) by mouth daily. 03/17/23   [name], MD  Testosterone 20.25 MG/1.25GM (1.62%) GEL Place 1.25 g onto the skin daily. Patient taking differently: Place 1 Pump onto the skin every other day. 06/29/22   [name], MD  Tiotropium Bromide Monohydrate (SPIRIVA RESPIMAT) 2.5 MCG/ACT AERS Inhale 2 puffs into the lungs daily. 05/28/23   Leslye Peer, MD  torsemide (DEMADEX) 20 MG tablet Take 1 tablet (20 mg total) by mouth 2 (two) times daily. 04/28/23   [name], MD  vitamin B-12 (CYANOCOBALAMIN) 1000 MCG tablet Take 1,000 mcg by mouth daily.    [provider]    Family History    Family History  Problem Relation Age of Onset   Lung cancer Father    High blood pressure Mother    Diabetes Maternal Grandmother    Colon polyps Neg Hx    Esophageal cancer Neg Hx    Pancreatic cancer Neg Hx    Stomach cancer Neg Hx    He indicated that his mother is alive. He indicated that his father is deceased. He indicated that his maternal grandmother is deceased. He indicated that his maternal grandfather is deceased. He indicated that his paternal grandmother is deceased. He indicated that his paternal grandfather is deceased. He indicated that his daughter is alive. He indicated that the status of his neg hx is unknown.  Social History    Social History   Socioeconomic History   Marital status: Single    Spouse name: Not on file   Number of children: 1   Years of education: 12   Highest education level: 12th grade  Occupational History   Occupation: disabled  Tobacco Use   Smoking status: Some Days    Packs/day: 2.00    Years: 46.00    Additional pack years: 0.00  Total pack years: 92.00    Types: Cigarettes    Last attempt to quit: 01/27/2022    Years since quitting: 1.3    Passive exposure: Past   Smokeless tobacco: Never   Tobacco comments:     Had 3 cigarettes in past 2 weeks ARJ 05/18/23  Vaping Use   Vaping Use: Never used  Substance and Sexual Activity   Alcohol use: Not Currently    Alcohol/week: 28.0 standard drinks of alcohol    Types: 28 Cans of beer per week   Drug use: No   Sexual activity: Yes    Partners: Female    Birth control/protection: None  Other Topics Concern   Not on file  Social History Narrative   Right handed   Tea sometimes and coffee (1/2 caffeine)   Social Determinants of Health   Financial Resource Strain: Medium Risk (03/15/2023)   Overall Financial Resource Strain (CARDIA)    Difficulty of Paying Living Expenses: Somewhat hard  Food Insecurity: Unknown (03/15/2023)   Hunger Vital Sign    Worried About Running Out of Food in the Last Year: Patient declined    Ran Out of Food in the Last Year: Never true  Transportation Needs: No Transportation Needs (03/15/2023)   PRAPARE - Administrator, Civil Service (Medical): No    Lack of Transportation (Non-Medical): No  Physical Activity: Unknown (03/15/2023)   Exercise Vital Sign    Days of Exercise per Week: 0 days    Minutes of Exercise per Session: Not on file  Stress: Stress Concern Present (03/15/2023)   [employer] of Occupational Health - Occupational Stress Questionnaire    Feeling of Stress : Rather much  Social Connections: Unknown (03/15/2023)   Social Connection and Isolation Panel [NHANES]    Frequency of Communication with Friends and Family: More than three times a week    Frequency of Social Gatherings with Friends and Family: Twice a week    Attends Religious Services: Patient declined    Database administrator or Organizations: No    Attends Engineer, structural: Not on file    Marital Status: Divorced  Intimate Partner Violence: Not on file     Review of Systems    General:  No chills, fever, night sweats or weight changes.  Cardiovascular:  No chest pain, dyspnea on exertion, edema, orthopnea,  palpitations, paroxysmal nocturnal dyspnea. Dermatological: No rash, lesions/masses Respiratory: No cough, dyspnea Urologic: No hematuria, dysuria Abdominal:   No nausea, vomiting, diarrhea, bright red blood per rectum, melena, or hematemesis Neurologic:  No visual changes, wkns, changes in mental status. All other systems reviewed and are otherwise negative except as noted above.  Physical Exam    VS:  There were no vitals taken for this visit. , BMI There is no height or weight on file to calculate BMI. GEN: Well nourished, well developed, in no acute distress. HEENT: normal. Neck: Supple, no JVD, carotid bruits, or masses. Cardiac: RRR, no murmurs, rubs, or gallops. No clubbing, cyanosis, edema.  Radials/DP/PT 2+ and equal bilaterally.  Respiratory:  Respirations regular and unlabored, clear to auscultation bilaterally. GI: Soft, nontender, nondistended, BS + x 4. MS: no deformity or atrophy. Skin: warm and dry, no rash. Neuro:  Strength and sensation are intact. Psych: Normal affect.  Accessory Clinical Findings    Recent Labs: 09/14/2022: Pro B Natriuretic peptide (BNP) 167.0 01/18/2023: B Natriuretic Peptide 161.1 03/17/2023: TSH 0.39 04/05/2023: ALT 20 04/26/2023: BUN 14; Creatinine, Ser 0.82; Hemoglobin  15.1; Platelets 179; Potassium 4.6; Sodium 140   Recent Lipid Panel    Component Value Date/Time   CHOL 221 (H) 03/17/2023 1554   TRIG 196.0 (H) 03/17/2023 1554   HDL 45.50 03/17/2023 1554   CHOLHDL 5 03/17/2023 1554   VLDL 39.2 03/17/2023 1554   LDLCALC 136 (H) 03/17/2023 1554   LDLCALC 108 (H) 07/10/2020 1035   LDLDIRECT 157.0 09/15/2022 1330    No BP recorded.  {Refresh Note OR Click here to enter BP  :1}***    ECG personally reviewed by me today- *** - No acute changes  EKG 09/28/22 V paced rhythm significant artifact nonspecific interventricular block around 95 bpm- No acute changes   Echocardiogram 07/31/2021   IMPRESSIONS     1. Diffuse hypokinesis worse  in inferior base with abnormal septal motion  EF similar to TTE done 04/16/21 . Left ventricular ejection fraction, by  estimation, is 30 to 35%. The left ventricle has moderately decreased  function. The left ventricle  demonstrates global hypokinesis. There is mild left ventricular  hypertrophy. Left ventricular diastolic parameters are indeterminate.   2. Right ventricular systolic function is moderately reduced. The right  ventricular size is mildly enlarged. There is normal pulmonary artery  systolic pressure.   3. The mitral valve is abnormal. Mild mitral valve regurgitation. No  evidence of mitral stenosis.   4. The aortic valve is tricuspid. Aortic valve regurgitation is not  visualized. Mild aortic valve sclerosis is present, with no evidence of  aortic valve stenosis.   5. Aortic dilatation noted. There is mild dilatation of the ascending  aorta, measuring 40 mm.   6. The inferior vena cava is dilated in size with <50% respiratory  variability, suggesting right atrial pressure of 15 mmHg.    Assessment & Plan   1.  ***   Thomasene Ripple. Mae Cianci NP-C     05/30/2023, 2:27 PM [facility] [address] Office [phone] Fax [phone]    I spent***minutes examining this patient, reviewing medications, and using patient centered shared decision making involving her cardiac care.  Prior to her visit I spent greater than 20 minutes reviewing her past medical history,  medications, and prior cardiac tests.

## 2023-06-01 ENCOUNTER — Telehealth: Payer: Self-pay | Admitting: Emergency Medicine

## 2023-06-01 DIAGNOSIS — R000 Tachycardia, unspecified: Secondary | ICD-10-CM | POA: Diagnosis not present

## 2023-06-01 DIAGNOSIS — I10 Essential (primary) hypertension: Secondary | ICD-10-CM | POA: Diagnosis not present

## 2023-06-01 DIAGNOSIS — R0902 Hypoxemia: Secondary | ICD-10-CM | POA: Diagnosis not present

## 2023-06-01 DIAGNOSIS — I87391 Chronic venous hypertension (idiopathic) with other complications of right lower extremity: Secondary | ICD-10-CM | POA: Diagnosis not present

## 2023-06-01 DIAGNOSIS — L720 Epidermal cyst: Secondary | ICD-10-CM | POA: Diagnosis not present

## 2023-06-01 DIAGNOSIS — R0689 Other abnormalities of breathing: Secondary | ICD-10-CM | POA: Diagnosis not present

## 2023-06-01 DIAGNOSIS — I872 Venous insufficiency (chronic) (peripheral): Secondary | ICD-10-CM | POA: Diagnosis not present

## 2023-06-01 NOTE — Telephone Encounter (Signed)
Patient would like the nurse or doctor to call regarding switching his montelukast (SINGULAIR) 5 MG chewable tablet medication to an alternative.  Please advise and call patient to discuss further.  CB# [phone]

## 2023-06-02 ENCOUNTER — Other Ambulatory Visit: Payer: Self-pay

## 2023-06-02 ENCOUNTER — Observation Stay (HOSPITAL_COMMUNITY)
Admission: EM | Admit: 2023-06-02 | Discharge: 2023-06-03 | Disposition: A | Discharge status: Home / Self Care | Payer: Medicare Other | Attending: Internal Medicine | Admitting: Internal Medicine

## 2023-06-02 ENCOUNTER — Ambulatory Visit: Payer: Medicare Other | Admitting: General Practice

## 2023-06-02 ENCOUNTER — Encounter (HOSPITAL_COMMUNITY): Payer: Self-pay

## 2023-06-02 ENCOUNTER — Emergency Department (HOSPITAL_COMMUNITY): Payer: Medicare Other

## 2023-06-02 ENCOUNTER — Inpatient Hospital Stay (HOSPITAL_BASED_OUTPATIENT_CLINIC_OR_DEPARTMENT_OTHER): Payer: Medicare Other

## 2023-06-02 ENCOUNTER — Other Ambulatory Visit (HOSPITAL_COMMUNITY): Payer: Medicare Other

## 2023-06-02 DIAGNOSIS — J9611 Chronic respiratory failure with hypoxia: Secondary | ICD-10-CM | POA: Diagnosis not present

## 2023-06-02 DIAGNOSIS — I2510 Atherosclerotic heart disease of native coronary artery without angina pectoris: Secondary | ICD-10-CM | POA: Insufficient documentation

## 2023-06-02 DIAGNOSIS — J449 Chronic obstructive pulmonary disease, unspecified: Secondary | ICD-10-CM | POA: Diagnosis not present

## 2023-06-02 DIAGNOSIS — Z79899 Other long term (current) drug therapy: Secondary | ICD-10-CM | POA: Diagnosis not present

## 2023-06-02 DIAGNOSIS — I5042 Chronic combined systolic (congestive) and diastolic (congestive) heart failure: Secondary | ICD-10-CM

## 2023-06-02 DIAGNOSIS — E876 Hypokalemia: Secondary | ICD-10-CM | POA: Diagnosis not present

## 2023-06-02 DIAGNOSIS — I5023 Acute on chronic systolic (congestive) heart failure: Secondary | ICD-10-CM | POA: Diagnosis not present

## 2023-06-02 DIAGNOSIS — I10 Essential (primary) hypertension: Secondary | ICD-10-CM | POA: Diagnosis present

## 2023-06-02 DIAGNOSIS — E118 Type 2 diabetes mellitus with unspecified complications: Secondary | ICD-10-CM | POA: Diagnosis present

## 2023-06-02 DIAGNOSIS — R778 Other specified abnormalities of plasma proteins: Secondary | ICD-10-CM | POA: Diagnosis not present

## 2023-06-02 DIAGNOSIS — Z1152 Encounter for screening for COVID-19: Secondary | ICD-10-CM | POA: Diagnosis not present

## 2023-06-02 DIAGNOSIS — I2489 Other forms of acute ischemic heart disease: Secondary | ICD-10-CM | POA: Diagnosis not present

## 2023-06-02 DIAGNOSIS — I110 Hypertensive heart disease with heart failure: Secondary | ICD-10-CM | POA: Insufficient documentation

## 2023-06-02 DIAGNOSIS — F17210 Nicotine dependence, cigarettes, uncomplicated: Secondary | ICD-10-CM | POA: Diagnosis not present

## 2023-06-02 DIAGNOSIS — G4733 Obstructive sleep apnea (adult) (pediatric): Secondary | ICD-10-CM | POA: Diagnosis present

## 2023-06-02 DIAGNOSIS — E538 Deficiency of other specified B group vitamins: Secondary | ICD-10-CM | POA: Diagnosis not present

## 2023-06-02 DIAGNOSIS — E119 Type 2 diabetes mellitus without complications: Secondary | ICD-10-CM | POA: Insufficient documentation

## 2023-06-02 DIAGNOSIS — I5021 Acute systolic (congestive) heart failure: Secondary | ICD-10-CM | POA: Diagnosis not present

## 2023-06-02 DIAGNOSIS — K219 Gastro-esophageal reflux disease without esophagitis: Secondary | ICD-10-CM | POA: Diagnosis present

## 2023-06-02 DIAGNOSIS — J45909 Unspecified asthma, uncomplicated: Secondary | ICD-10-CM | POA: Insufficient documentation

## 2023-06-02 DIAGNOSIS — R7989 Other specified abnormal findings of blood chemistry: Secondary | ICD-10-CM | POA: Diagnosis not present

## 2023-06-02 DIAGNOSIS — I509 Heart failure, unspecified: Secondary | ICD-10-CM | POA: Diagnosis not present

## 2023-06-02 DIAGNOSIS — E669 Obesity, unspecified: Secondary | ICD-10-CM | POA: Insufficient documentation

## 2023-06-02 DIAGNOSIS — K7030 Alcoholic cirrhosis of liver without ascites: Secondary | ICD-10-CM

## 2023-06-02 DIAGNOSIS — R0602 Shortness of breath: Secondary | ICD-10-CM | POA: Diagnosis not present

## 2023-06-02 DIAGNOSIS — Z950 Presence of cardiac pacemaker: Secondary | ICD-10-CM | POA: Diagnosis not present

## 2023-06-02 DIAGNOSIS — J9811 Atelectasis: Secondary | ICD-10-CM | POA: Diagnosis not present

## 2023-06-02 LAB — CBC WITH DIFFERENTIAL/PLATELET
Abs Immature Granulocytes: 0.04 10*3/uL (ref 0.00–0.07)
Basophils Absolute: 0 10*3/uL (ref 0.0–0.1)
Basophils Relative: 0 %
Eosinophils Absolute: 0.1 10*3/uL (ref 0.0–0.5)
Eosinophils Relative: 1 %
HCT: 49.1 % (ref 39.0–52.0)
Hemoglobin: 16.4 g/dL (ref 13.0–17.0)
Immature Granulocytes: 0 %
Lymphocytes Relative: 7 %
Lymphs Abs: 0.7 10*3/uL (ref 0.7–4.0)
MCH: 32.7 pg (ref 26.0–34.0)
MCHC: 33.4 g/dL (ref 30.0–36.0)
MCV: 98 fL (ref 80.0–100.0)
Monocytes Absolute: 0.8 10*3/uL (ref 0.1–1.0)
Monocytes Relative: 9 %
Neutro Abs: 7.8 10*3/uL — ABNORMAL HIGH (ref 1.7–7.7)
Neutrophils Relative %: 83 %
Platelets: 178 10*3/uL (ref 150–400)
RBC: 5.01 MIL/uL (ref 4.22–5.81)
RDW: 13.2 % (ref 11.5–15.5)
WBC: 9.3 10*3/uL (ref 4.0–10.5)
nRBC: 0 % (ref 0.0–0.2)

## 2023-06-02 LAB — RESP PANEL BY RT-PCR (RSV, FLU A&B, COVID)  RVPGX2
Influenza A by PCR: NEGATIVE
Influenza B by PCR: NEGATIVE
Resp Syncytial Virus by PCR: NEGATIVE
SARS Coronavirus 2 by RT PCR: NEGATIVE

## 2023-06-02 LAB — BASIC METABOLIC PANEL
Anion gap: 10 (ref 5–15)
BUN: 17 mg/dL (ref 8–23)
CO2: 29 mmol/L (ref 22–32)
Calcium: 9.1 mg/dL (ref 8.9–10.3)
Chloride: 97 mmol/L — ABNORMAL LOW (ref 98–111)
Creatinine, Ser: 0.88 mg/dL (ref 0.61–1.24)
GFR, Estimated: >60 mL/min (ref >60)
Glucose, Bld: 118 mg/dL — ABNORMAL HIGH (ref 70–99)
Potassium: 3.4 mmol/L — ABNORMAL LOW (ref 3.5–5.1)
Sodium: 136 mmol/L (ref 135–145)

## 2023-06-02 LAB — GLUCOSE, CAPILLARY: Glucose-Capillary: 178 mg/dL — ABNORMAL HIGH (ref 70–99)

## 2023-06-02 LAB — HIV ANTIBODY (ROUTINE TESTING W REFLEX): HIV Screen 4th Generation wRfx: NONREACTIVE

## 2023-06-02 LAB — ECHOCARDIOGRAM LIMITED
Calc EF: 36.1 %
Height: 70 in
S' Lateral: 5.7 cm
Single Plane A2C EF: 31.1 %
Single Plane A4C EF: 45.7 %
Weight: 3760 oz

## 2023-06-02 LAB — BRAIN NATRIURETIC PEPTIDE: B Natriuretic Peptide: 454.8 pg/mL — ABNORMAL HIGH (ref 0.0–100.0)

## 2023-06-02 LAB — TROPONIN I (HIGH SENSITIVITY)
Troponin I (High Sensitivity): 79 ng/L — ABNORMAL HIGH (ref <18)
Troponin I (High Sensitivity): 71 ng/L — ABNORMAL HIGH (ref <18)
Troponin I (High Sensitivity): 93 ng/L — ABNORMAL HIGH (ref <18)

## 2023-06-02 MED ORDER — MOMETASONE FURO-FORMOTEROL FUM 100-5 MCG/ACT IN AERO
2.0000 | INHALATION_SPRAY | Freq: Two times a day (BID) | RESPIRATORY_TRACT | Status: DC
  Administered 2023-06-02 – 2023-06-03 (×2): 2 via RESPIRATORY_TRACT
  Filled 2023-06-02: qty 8.8

## 2023-06-02 MED ORDER — FUROSEMIDE 10 MG/ML IJ SOLN
40.0000 mg | Freq: Once | INTRAMUSCULAR | Status: AC
  Administered 2023-06-02: 40 mg via INTRAVENOUS
  Filled 2023-06-02: qty 4

## 2023-06-02 MED ORDER — POTASSIUM CHLORIDE CRYS ER 20 MEQ PO TBCR
40.0000 meq | EXTENDED_RELEASE_TABLET | Freq: Three times a day (TID) | ORAL | Status: DC
  Administered 2023-06-02: 40 meq via ORAL
  Filled 2023-06-02: qty 2

## 2023-06-02 MED ORDER — ROFLUMILAST 500 MCG PO TABS
500.0000 ug | ORAL_TABLET | Freq: Every day | ORAL | Status: DC
  Administered 2023-06-02 – 2023-06-03 (×2): 500 ug via ORAL
  Filled 2023-06-02 (×2): qty 1

## 2023-06-02 MED ORDER — METHYLPREDNISOLONE SODIUM SUCC 125 MG IJ SOLR
125.0000 mg | INTRAMUSCULAR | Status: AC
  Administered 2023-06-02: 125 mg via INTRAVENOUS
  Filled 2023-06-02: qty 2

## 2023-06-02 MED ORDER — ENOXAPARIN SODIUM 40 MG/0.4ML IJ SOSY
40.0000 mg | PREFILLED_SYRINGE | INTRAMUSCULAR | Status: DC
  Administered 2023-06-02: 40 mg via SUBCUTANEOUS
  Filled 2023-06-02 (×2): qty 0.4

## 2023-06-02 MED ORDER — NICOTINE 14 MG/24HR TD PT24
14.0000 mg | MEDICATED_PATCH | Freq: Once | TRANSDERMAL | Status: AC
  Administered 2023-06-02: 14 mg via TRANSDERMAL
  Filled 2023-06-02: qty 1

## 2023-06-02 MED ORDER — PANTOPRAZOLE SODIUM 20 MG PO TBEC
20.0000 mg | DELAYED_RELEASE_TABLET | Freq: Two times a day (BID) | ORAL | Status: DC
  Administered 2023-06-02 – 2023-06-03 (×3): 20 mg via ORAL
  Filled 2023-06-02 (×3): qty 1

## 2023-06-02 MED ORDER — POTASSIUM CHLORIDE CRYS ER 20 MEQ PO TBCR
40.0000 meq | EXTENDED_RELEASE_TABLET | Freq: Once | ORAL | Status: DC
  Filled 2023-06-02: qty 2

## 2023-06-02 MED ORDER — VITAMIN B-12 1000 MCG PO TABS
1000.0000 ug | ORAL_TABLET | Freq: Every day | ORAL | Status: DC
  Administered 2023-06-02 – 2023-06-03 (×2): 1000 ug via ORAL
  Filled 2023-06-02 (×2): qty 1

## 2023-06-02 MED ORDER — INSULIN ASPART 100 UNIT/ML IJ SOLN
0.0000 [IU] | Freq: Three times a day (TID) | INTRAMUSCULAR | Status: DC
  Administered 2023-06-03: 1 [IU] via SUBCUTANEOUS

## 2023-06-02 MED ORDER — LOSARTAN POTASSIUM 25 MG PO TABS
25.0000 mg | ORAL_TABLET | Freq: Every day | ORAL | Status: DC
  Administered 2023-06-02: 25 mg via ORAL
  Filled 2023-06-02 (×2): qty 1

## 2023-06-02 MED ORDER — ALBUTEROL SULFATE (2.5 MG/3ML) 0.083% IN NEBU
2.5000 mg | INHALATION_SOLUTION | RESPIRATORY_TRACT | Status: DC | PRN
  Administered 2023-06-02: 2.5 mg via RESPIRATORY_TRACT
  Filled 2023-06-02: qty 3

## 2023-06-02 MED ORDER — IPRATROPIUM-ALBUTEROL 0.5-2.5 (3) MG/3ML IN SOLN
3.0000 mL | Freq: Four times a day (QID) | RESPIRATORY_TRACT | Status: DC
  Administered 2023-06-02 – 2023-06-03 (×7): 3 mL via RESPIRATORY_TRACT
  Filled 2023-06-02 (×7): qty 3

## 2023-06-02 MED ORDER — POTASSIUM CHLORIDE CRYS ER 20 MEQ PO TBCR: 40.0000 meq | EXTENDED_RELEASE_TABLET | Freq: Three times a day (TID) | ORAL | Status: DC

## 2023-06-02 MED ORDER — FUROSEMIDE 10 MG/ML IJ SOLN
40.0000 mg | Freq: Two times a day (BID) | INTRAMUSCULAR | Status: DC
  Administered 2023-06-02: 40 mg via INTRAVENOUS
  Filled 2023-06-02: qty 4

## 2023-06-02 MED ORDER — ALPRAZOLAM 0.25 MG PO TABS
0.2500 mg | ORAL_TABLET | Freq: Two times a day (BID) | ORAL | Status: DC | PRN
  Administered 2023-06-02: 0.25 mg via ORAL
  Filled 2023-06-02: qty 1

## 2023-06-02 MED ORDER — FUROSEMIDE 10 MG/ML IJ SOLN
6.0000 mg/h | INTRAVENOUS | Status: DC
  Administered 2023-06-02 – 2023-06-03 (×2): 6 mg/h via INTRAVENOUS
  Filled 2023-06-02 (×3): qty 20

## 2023-06-02 MED ORDER — IPRATROPIUM BROMIDE 0.02 % IN SOLN
0.5000 mg | Freq: Once | RESPIRATORY_TRACT | Status: AC
  Administered 2023-06-02: 0.5 mg via RESPIRATORY_TRACT
  Filled 2023-06-02: qty 2.5

## 2023-06-02 MED ORDER — POTASSIUM CHLORIDE CRYS ER 20 MEQ PO TBCR
40.0000 meq | EXTENDED_RELEASE_TABLET | Freq: Three times a day (TID) | ORAL | Status: AC
  Administered 2023-06-02 (×2): 40 meq via ORAL
  Filled 2023-06-02 (×2): qty 2

## 2023-06-02 MED ORDER — ALBUTEROL SULFATE (2.5 MG/3ML) 0.083% IN NEBU
5.0000 mg | INHALATION_SOLUTION | Freq: Once | RESPIRATORY_TRACT | Status: AC
  Administered 2023-06-02: 5 mg via RESPIRATORY_TRACT
  Filled 2023-06-02: qty 6

## 2023-06-02 MED ORDER — ALLOPURINOL 300 MG PO TABS
300.0000 mg | ORAL_TABLET | Freq: Every day | ORAL | Status: DC
  Administered 2023-06-02 – 2023-06-03 (×2): 300 mg via ORAL
  Filled 2023-06-02 (×2): qty 1

## 2023-06-02 NOTE — Consult Note (Addendum)
Cardiology Consultation   Patient ID: Robert Church MRN: [id number]; DOB: January 13, 1956  Admit date: 06/02/2023 Date of Consult: 06/02/2023  PCP:  [name], MD   [facility] Providers Cardiologist:  Nanetta Batty, MD  Electrophysiologist:  Regan Lemming, MD  {    Patient Profile:   Robert Church is a 67 y.o. male with a hx of HFrEF, type 2 DM, HTN, GERD, obesity, chronic anxiety, OSA on CPAP, tobacco use, nonobstructive CAD, symptomatic bradycardia s/p PPM, carotid artery disease who is being seen 06/02/2023 for the evaluation of CHF at the request of Dr. Alvino Chapel.  History of Present Illness:   Robert Church is a 67 year old male with above medical history who is followed by Dr. Allyson Sabal and Dr. Elberta Fortis. Per chart review, patient had an echocardiogram in 06/2018 that showed EF 30-35% with grade 1 diastolic dysfunction.  She had a coronary CT in 08/2018 that showed a coronary calcium score of 148 (74th percentile), mild, nonobstructive plaque.  Study also noted to be dilated pulmonary artery measuring 34 mm suggestive of pulmonary hypertension.  It was recommended patient be treated with aggressive risk factor modification.  He was started on GDM, but did not tolerate entresto or BB.  Repeat echocardiogram in 12/2018 showed EF improved to 40-45% with grade 1 diastolic dysfunction.  He was later admitted to the hospital in 11/2020 with acute on chronic heart failure.  Echocardiogram 12/18/2020 showed EF 40-45%, grade 2 diastolic dysfunction, mildly reduced RV systolic function.    In 03/2021, patient had multiple episodes of presyncope.  He wore a cardiac monitor in 03/2021 that showed transient third-degree AV block.  He had a Retail buyer pacemaker implanted on 04/16/2021.  Of note, cardiac monitor also showed possible atrial fibrillation.  Dr. Elberta Fortis reviewed tracings and was not convinced it was atrial fibrillation, so  patient has not been on anticoagulation.   Echocardiogram on  07/31/21 showed EF 30-35% with  moderately reduced RV systolic function, mild mitral valve regurgitation. R/L heart cath on 08/22/21 showed normal coronary arteries, mildly elevated filling pressures with normal output. Patient was referred to EP to discuss upgrade to CRT for LBBB. Ultimately underwent Biventricular pacemaker upgrade on 11/11/22 with Dr. Elberta Fortis. He was seen in the ED on 11/15/22 with LUE edema, and was eventually found to have a DVT in left arm and was started on eliquis.   He was seen by Dr. Gala Romney in 12/2022. He complained of symptoms consistent with volume overload, and his torsemide was increased. Echocardiogram on 01/13/23 showed EF <20%, severely dilated LV, mildly reduced RV systolic function, mild mitral valve regurgitation. There was concern for low output HF, and patient underwent RHC on 03/09/23 that showed mild PAH with normal left-sided filling presures, mildly reduced cardiac output. Overall, hemodynamics had improved with CRT.   Patient presented to the ED on 6/12 complaining of shortness of breath that been ongoing for 3 days.  Also complained of productive cough with clear mucus.  Initial vital signs in the ED showed BP 149/93, oxygen 88% on room air, heart rate 105 bpm.  Labs in the ED showed BNP elevated to 454.8.  High-sensitivity troponin 79, 71, 93.  Potassium 3.4, creatinine 0.88, WBC 9.3, hemoglobin 16.4, platelets 178.  COVID, flu, RSV negative.  Chest x-ray showed stable cardiomegaly with mild interstitial edema.   On interview, patient reports that he has had intermittent issues with ankle edema for the past couple of months.  About a week  ago, he reports having a "head cold" with nasal congestion, cough, shortness of breath.  After he started to feel better from his cold, he noticed that he had a feeling of chest congestion.  He had worsening shortness of breath for the past 3 days.  Breathing is worse on exertion or when laying flat on his back.  He also had  worsening ankle edema for the past 3 days.  He has not been taking any of his as needed metolazone because he did not know that it was fluid.  He he has had a cough that produces a clear sputum.  Denies chest pain, palpitations.  Past Medical History:  Diagnosis Date   Adenomatous colon polyp    Allergy    Anal fissure    Arthritis    Asthma    Bronchitis    CHF (congestive heart failure) (HCC)    COPD, group D, by GOLD 2017 classification (HCC)    Dyspnea    Emphysema lung (HCC)    GERD (gastroesophageal reflux disease)    History of hiatal hernia    Hyperlipidemia    Hypertension    NICM (nonischemic cardiomyopathy) (HCC)    OSA (obstructive sleep apnea)    Pneumonia    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   Requires supplemental oxygen    Sinusitis    SOB (shortness of breath)     Past Surgical History:  Procedure Laterality Date   BIV UPGRADE N/A 10/07/2021   Procedure: BIV PPM UPGRADE;  Surgeon: Regan Lemming, MD;  Location: [facility];  Service: Cardiovascular;  Laterality: N/A;   BIV UPGRADE N/A 11/11/2022   Procedure: BIV PPM UPGRADE;  Surgeon: Regan Lemming, MD;  Location: [facility];  Service: Cardiovascular;  Laterality: N/A;   LEFT HEART CATH AND CORONARY ANGIOGRAPHY N/A 04/19/2017   Procedure: Left Heart Cath and Coronary Angiography;  Surgeon: Lyn Records, MD;  Location: [facility] INVASIVE CV LAB;  Service: Cardiovascular;  Laterality: N/A;   PACEMAKER IMPLANT N/A 04/16/2021   Procedure: PACEMAKER IMPLANT;  Surgeon: Regan Lemming, MD;  Location: [facility];  Service: Cardiovascular;  Laterality: N/A;   RIGHT HEART CATH N/A 03/09/2023   Procedure: RIGHT HEART CATH;  Surgeon: Dolores Patty, MD;  Location: [facility];  Service: Cardiovascular;  Laterality: N/A;   RIGHT/LEFT HEART CATH AND CORONARY ANGIOGRAPHY N/A 08/22/2021   Procedure: RIGHT/LEFT HEART CATH AND CORONARY ANGIOGRAPHY;  Surgeon:  Dolores Patty, MD;  Location: [facility];  Service: Cardiovascular;  Laterality: N/A;   TONSILLECTOMY       Home Medications:  Prior to Admission medications   Medication Sig Start Date End Date Taking? Authorizing Provider  albuterol (PROVENTIL) (2.5 MG/3ML) 0.083% nebulizer solution Take 3 mLs (2.5 mg total) by nebulization every 4 (four) hours as needed for wheezing or shortness of breath. 11/26/22  Yes Leslye Peer, MD  albuterol (VENTOLIN HFA) 108 (90 Base) MCG/ACT inhaler INHALE 2 PUFFS INTO THE LUNGS EVERY 6 HOURS AS NEEDED FOR WHEEZING 02/09/23  Yes Byrum, Les Pou, MD  allopurinol (ZYLOPRIM) 300 MG tablet Take 1 tablet (300 mg total) by mouth daily. 03/18/23  Yes [name], MD  ALPRAZolam Prudy Feeler) 0.5 MG tablet Take 1 tablet (0.5 mg total) by mouth 3 (three) times daily. Patient taking differently: Take 0.5 mg by mouth 3 (three) times daily as needed for anxiety. 04/05/23  Yes [name],  MD  AMBULATORY NON FORMULARY MEDICATION Medication Name: Nitroglycerin gel 0.125% apply 2-3 times daily to the anal canal. Patient taking differently: Place 1 Application rectally daily as needed (Fissure). Medication Name: Nitroglycerin gel 0.125% apply 2-3 times daily to the anal canal. 01/08/22  Yes Zehr, Princella Pellegrini, PA-C  DALIRESP 500 MCG TABS tablet TAKE 1 TABLET(500 MCG) BY MOUTH DAILY 03/21/23  Yes Byrum, Les Pou, MD  diclofenac Sodium (VOLTAREN) 1 % GEL Apply 1 Application topically in the morning and at bedtime.   Yes [provider]  EPINEPHrine 0.3 mg/0.3 mL IJ SOAJ injection Inject 0.3 mg into the muscle as needed for anaphylaxis. 07/13/22  Yes [name], MD  fluticasone (FLONASE) 50 MCG/ACT nasal spray SHAKE LIQUID AND USE 2 SPRAYS IN EACH NOSTRIL DAILY Patient taking differently: Place 1 spray into both nostrils daily as needed for allergies. 06/29/22  Yes Leslye Peer, MD  guaiFENesin (MUCINEX) 600 MG 12 hr tablet Take 1 tablet (600 mg total) by mouth 2  (two) times daily. Patient taking differently: Take 600 mg by mouth 2 (two) times daily as needed for cough or to loosen phlegm. 12/17/21  Yes Leslye Peer, MD  ipratropium (ATROVENT) 0.06 % nasal spray Place 2 sprays into both nostrils 4 (four) times daily as needed for rhinitis. 03/17/23  Yes Leslye Peer, MD  lidocaine (XYLOCAINE) 5 % ointment Apply topically 3 (three) times daily as needed. Patient taking differently: Apply 1 Application topically daily as needed (Fissure). 09/15/22  Yes [name], MD  Lidocaine, Anorectal, 5 % CREA Apply 1 application topically 2 (two) times daily. Patient taking differently: Apply 1 application  topically daily as needed (Fissure). 01/08/22  Yes Zehr, Princella Pellegrini, PA-C  loratadine (CLARITIN) 10 MG tablet Take 10 mg by mouth daily.   Yes [provider]  losartan (COZAAR) 25 MG tablet Take 1 tablet (25 mg total) by mouth daily. 01/13/23  Yes Bensimhon, Bevelyn Buckles, MD  metolazone (ZAROXOLYN) 2.5 MG tablet Take 1 tablet (2.5 mg total) by mouth as directed. Patient taking differently: Take 2.5 mg by mouth daily as needed (swelling). 01/13/23  Yes Bensimhon, Bevelyn Buckles, MD  mometasone-formoterol (DULERA) 100-5 MCG/ACT AERO Inhale 2 puffs into the lungs 2 (two) times daily. 12/10/22  Yes Leslye Peer, MD  nicotine (NICODERM CQ - DOSED IN MG/24 HOURS) 14 mg/24hr patch Place 1 patch (14 mg total) onto the skin daily. 05/22/22  Yes Leslye Peer, MD  nystatin (MYCOSTATIN) 100000 UNIT/ML suspension Take 5 mLs (500,000 Units total) by mouth in the morning, at noon, and at bedtime. 05/19/23  Yes Leslye Peer, MD  omeprazole (PRILOSEC) 20 MG capsule TAKE 1 CAPSULE(20 MG) BY MOUTH TWICE DAILY BEFORE A MEAL FOR ACID REFLUX 04/28/23  Yes [name], MD  potassium chloride SA (KLOR-CON M20) 20 MEQ tablet Take 1 tablet (20 mEq total) by mouth daily. Take an extra 40 meq when you take metolazone 01/18/23  Yes Bensimhon, Bevelyn Buckles, MD  predniSONE (DELTASONE) 10 MG  tablet Take 4 tablets (40 mg total) by mouth daily with breakfast for 5 days. Patient taking differently: Take 20 mg by mouth daily with breakfast. 05/28/23 06/02/23 Yes Cobb, Ruby Cola, NP  Testosterone 20.25 MG/1.25GM (1.62%) GEL Place 1.25 g onto the skin daily. Patient taking differently: Place 1 Pump onto the skin every other day. 06/29/22  Yes [name], MD  Tiotropium Bromide Monohydrate (SPIRIVA RESPIMAT) 2.5 MCG/ACT AERS Inhale 2 puffs into the lungs  daily. 05/28/23  Yes Leslye Peer, MD  torsemide (DEMADEX) 20 MG tablet Take 1 tablet (20 mg total) by mouth 2 (two) times daily. 04/28/23  Yes [name], MD  vitamin B-12 (CYANOCOBALAMIN) 1000 MCG tablet Take 1,000 mcg by mouth daily.   Yes [provider]  icosapent Ethyl (VASCEPA) 1 g capsule Take 2 capsules (2 g total) by mouth 2 (two) times daily. Patient not taking: Reported on 06/02/2023 03/19/23   [name], MD  montelukast (SINGULAIR) 5 MG chewable tablet Chew 2 tablets (10 mg total) by mouth at bedtime. Patient not taking: Reported on 06/02/2023 05/19/23   Leslye Peer, MD  pravastatin (PRAVACHOL) 10 MG tablet Take 1 tablet (10 mg total) by mouth daily. Patient not taking: Reported on 06/02/2023 03/19/23   [name], MD  predniSONE (DELTASONE) 10 MG tablet TAKE 1 TABLET BY MOUTH EVERY DAY WITH BREAKFAST Patient not taking: Reported on 06/02/2023 04/28/23   [name], MD  Semaglutide (RYBELSUS) 3 MG TABS Take 1 tablet (3 mg total) by mouth daily. Patient not taking: Reported on 06/02/2023 03/17/23   [name], MD    Inpatient Medications: Scheduled Meds:  allopurinol  300 mg Oral Daily   cyanocobalamin  1,000 mcg Oral Daily   enoxaparin (LOVENOX) injection  40 mg Subcutaneous Q24H   furosemide  40 mg Intravenous BID   ipratropium-albuterol  3 mL Nebulization Q6H   mometasone-formoterol  2 puff Inhalation BID   nicotine  14 mg Transdermal Once   potassium chloride  40 mEq Oral TID    Continuous Infusions:  PRN Meds: albuterol, ALPRAZolam  Allergies:    Allergies  Allergen Reactions   Bee Venom Shortness Of Breath and Swelling   Cefdinir Other (See Comments)    Patient states he couldn't really breath    Spironolactone Anaphylaxis   Daliresp [Roflumilast] Other (See Comments)    Dizzy, headache, leg pain per patient. 02/25/22. Tolerates in low doses    Jardiance [Empagliflozin] Nausea Only and Other (See Comments)    Lightheadness Dizziness   Penicillins Swelling and Other (See Comments)    Childhood rxn--MD stated he "almost died" Has patient had a PCN reaction causing immediate rash, facial/tongue/throat swelling, SOB or lightheadedness with hypotension:Yes Has patient had a PCN reaction causing severe rash involving mucus membranes or skin necrosis:unsure Has patient had a PCN reaction that required hospitalization:unsure Has patient had a PCN reaction occurring within the last 10 years:No If all of the above answers are "NO", then may proceed with Cephalosporin use.     Clindamycin Other (See Comments)    Tongue swelling   Doxycycline Nausea And Vomiting    Severe stomach upset per patient   E-Mycin [Erythromycin Base] Swelling   Rosuvastatin Other (See Comments)    Myalgias (intolerance)    Social History:   Social History   Socioeconomic History   Marital status: Single    Spouse name: Not on file   Number of children: 1   Years of education: 12   Highest education level: 12th grade  Occupational History   Occupation: disabled  Tobacco Use   Smoking status: Some Days    Packs/day: 2.00    Years: 46.00    Additional pack years: 0.00    Total pack years: 92.00    Types: Cigarettes    Last attempt to quit: 01/27/2022    Years since quitting: 1.3    Passive exposure: Past   Smokeless tobacco: Never  Tobacco comments:    Had 3 cigarettes in past 2 weeks ARJ 05/18/23  Vaping Use   Vaping Use: Never used  Substance and Sexual Activity    Alcohol use: Not Currently    Alcohol/week: 28.0 standard drinks of alcohol    Types: 28 Cans of beer per week   Drug use: No   Sexual activity: Yes    Partners: Female    Birth control/protection: None  Other Topics Concern   Not on file  Social History Narrative   Right handed   Tea sometimes and coffee (1/2 caffeine)   Social Determinants of Health   Financial Resource Strain: Medium Risk (03/15/2023)   Overall Financial Resource Strain (CARDIA)    Difficulty of Paying Living Expenses: Somewhat hard  Food Insecurity: Unknown (03/15/2023)   Hunger Vital Sign    Worried About Running Out of Food in the Last Year: Patient declined    Ran Out of Food in the Last Year: Never true  Transportation Needs: No Transportation Needs (03/15/2023)   PRAPARE - Administrator, Civil Service (Medical): No    Lack of Transportation (Non-Medical): No  Physical Activity: Unknown (03/15/2023)   Exercise Vital Sign    Days of Exercise per Week: 0 days    Minutes of Exercise per Session: Not on file  Stress: Stress Concern Present (03/15/2023)   [employer] of Occupational Health - Occupational Stress Questionnaire    Feeling of Stress : Rather much  Social Connections: Unknown (03/15/2023)   Social Connection and Isolation Panel [NHANES]    Frequency of Communication with Friends and Family: More than three times a week    Frequency of Social Gatherings with Friends and Family: Twice a week    Attends Religious Services: Patient declined    Database administrator or Organizations: No    Attends Engineer, structural: Not on file    Marital Status: Divorced  Catering manager Violence: Not on file    Family History:    Family History  Problem Relation Age of Onset   Lung cancer Father    High blood pressure Mother    Diabetes Maternal Grandmother    Colon polyps Neg Hx    Esophageal cancer Neg Hx    Pancreatic cancer Neg Hx    Stomach cancer Neg Hx      ROS:   Please see the history of present illness.   All other ROS reviewed and negative.     Physical Exam/Data:   Vitals:   06/02/23 0830 06/02/23 0930 06/02/23 1019 06/02/23 1030  BP: 123/86 (!) 138/100  (!) 139/106  Pulse: (!) 112 (!) 101  99  Resp: (!) 21 20  20   Temp:   (!) 97.4 F (36.3 C)   TempSrc:   Oral   SpO2: 98% 97%  98%  Weight:      Height:       No intake or output data in the 24 hours ending 06/02/23 1113    06/02/2023    1:01 AM 05/20/2023    3:37 PM 05/19/2023    3:29 PM  Last 3 Weights  Weight (lbs) 235 lb 238 lb 238 lb 6.4 oz  Weight (kg) 106.595 kg 107.956 kg 108.138 kg     Body mass index is 33.72 kg/m.  General:  Middle aged male, sitting on the side of the bed leaning forward. Appears uncomfortable but is in no acute distress  HEENT: normal Neck: + JVD Vascular: Radial  pulses 2+ bilaterally Cardiac:  normal S1, S2; RRR; no murmur  Lungs: Expiratory wheezes and crackles throughout all lung fields  Abd: soft, nontender. Distended  Ext: 2+ pitting edema in BLE, edema reaches the knees  Musculoskeletal:  No deformities, BUE and BLE strength normal and equal Skin: warm and dry  Neuro:  CNs 2-12 intact, no focal abnormalities noted Psych:  Normal affect   EKG:  The EKG was personally reviewed and demonstrates:  significant artifact is present on EKG, but it appears to show sinus tachycardia, HR 105 BPM, with LBBB Telemetry:  Telemetry was personally reviewed and demonstrates:  Sinus rhythm, wide QRS complexes. HR in the 90s-100s   Relevant CV Studies:   Laboratory Data:  High Sensitivity Troponin:   Recent Labs  Lab 06/02/23 0108 06/02/23 0319 06/02/23 0558  TROPONINIHS 79* 71* 93*     Chemistry Recent Labs  Lab 06/02/23 0108  NA 136  K 3.4*  CL 97*  CO2 29  GLUCOSE 118*  BUN 17  CREATININE 0.88  CALCIUM 9.1  GFRNONAA >60  ANIONGAP 10    No results for input(s): "PROT", "ALBUMIN", "AST", "ALT", "ALKPHOS", "BILITOT" in the last 168  hours. Lipids No results for input(s): "CHOL", "TRIG", "HDL", "LABVLDL", "LDLCALC", "CHOLHDL" in the last 168 hours.  Hematology Recent Labs  Lab 06/02/23 0108  WBC 9.3  RBC 5.01  HGB 16.4  HCT 49.1  MCV 98.0  MCH 32.7  MCHC 33.4  RDW 13.2  PLT 178   Thyroid No results for input(s): "TSH", "FREET4" in the last 168 hours.  BNP Recent Labs  Lab 06/02/23 0108  BNP 454.8*    DDimer No results for input(s): "DDIMER" in the last 168 hours.   Radiology/Studies:  DG Chest Portable 1 View  Result Date: 06/02/2023 CLINICAL DATA:  Chest congestion and shortness of breath. EXAM: PORTABLE CHEST 1 VIEW COMPARISON:  November 11, 2022 FINDINGS: There is stable dual lead AICD positioning. An additional stable right chest a vagal stimulator is seen. The cardiac silhouette is mildly enlarged and unchanged in size. Mild, diffusely increased interstitial lung markings are noted with mild atelectasis seen within the mid left lung and bilateral lung bases. No pleural effusion or pneumothorax is identified. The visualized skeletal structures are unremarkable. IMPRESSION: Stable cardiomegaly with mild interstitial edema with mild bilateral mid left lung and bibasilar atelectasis. Electronically Signed   By: Aram Candela M.D.   On: 06/02/2023 01:30     Assessment and Plan:   Acute on Chronic Systolic Heart Failure  Nonischemic cardiomyopathy  - Most recent echo from 12/2022 showed EF less than 20% with severe dilation of the left ventricle.  Mildly reduced RV systolic function - Most recent left heart cath from 08/2021 showed normal coronary arteries - Patient has a Bi-V PPM (implanted 10/2022) - on RHC from 02/2023, it was noted that hemodynamics improved with CRT  - GDMT is limited by patient's multiple medication intolerances- entresto (dizziness, low BP), spironolactone (history of anaphylaxis), jardiance (positional dizziness, nausea), bisoprolol (fatigue), carvedilol (dizziness), toprol (dry  mouth)  - PTA, patient was on torsemide 20 mg BID, metolazone 2.5 mg PRN, losartan 25 mg daily  - Now, patient presents complaining of 3 days of worsening shortness of breath, months of ankle edema.  BNP elevated to 454, chest x-ray with mild interstitial edema. He is grossly volume overloaded on exam with 2+ lower extremity edema to the knees, crackles in lungs, +JVD  - Increase IV lasix to 80 mg BID  -  Strict I/Os, daily weights, daily BMPs   - Repeat echocardiogram pending  - Continue home losartan 25 mg daily   Hypokalemia - K was 3.4 on presentation  - Supplementation ordered per primary   Third Degree HB s/p PPM  - Patient first had a St Jude PPM implanted in 03/2021. Upgraded to a bi-V PPM in 10/2022 - Followed by Dr. Elberta Fortis as an outpatient  Demand Ischemia  - hsTn 79>71>93  - No chest pain  - Trend is not consistent with ACS. Suspect trop elevation is demand ischemia in the setting of CHF exacerbation   Otherwise per primary  - COPD - Type 2 DM  - Chronic anxiety - Obesity - GERD  - Hyperuricemia  - Vitamin B12 deficiency   Risk Assessment/Risk Scores:   New York Heart Association (NYHA) Functional Class NYHA Class IV   For questions or updates, please contact [facility] Please consult [email or web address] for contact info under    Signed, Jonita Albee, PA-C  06/02/2023 11:13 AM  History and all data above reviewed.  Patient examined.  I agree with the findings as above.  The patient is known to [facility].he has severe nonischemic cardiomyopathy and has been intolerant of multiple meds.  He says that he has been having increasing lower extremity swelling for about 3 weeks but says he is only gained about 7 pounds.  He says he is fairly compliant with a low-salt diet.  He has had cough and sinus congestion.  He has been treated recently for cellulitis.  He presents now because the cough has gotten significant productive of yellow  sputum.  He has minimal white plaque.  He sounds like he is describing PND and orthopnea.  He has not been having any new chest pressure, neck or arm discomfort.  He has as needed oxygen and he says his use of this is been about the same.  He lives by himself.  He says he is usually able to walk up an incline to his mailbox but he has not been able to do this recently.  The patient exam reveals COR: Regular rate and rhythm, no murmurs.,  Lungs: Decreased breath sounds bilaterally with crackles.,  Abd: Distended., Ext severe edema..  All available labs, radiology testing, previous records reviewed. Agree with documented assessment and plan.  Acute systolic heart failure: Patient has acute heart failure and he initially wanted to leave but agrees to stay for probably a couple of days.  I am going to have him wear compression stockings.  I am actually use a Lasix IV infusion.   I think he can tolerate his low-dose of ACE inhibitor.  He is unfortunately been intolerant of other med titration.   Of note is getting 80 mill equivalents ordered of potassium and I am going to give him an extra 40 mill equivalents today given his low potassium baseline and significant diuresis we hope to achieve.  Elevated troponin: Suspect this is demand ischemia.  No ischemia workup is suggested.  Sinus congestion/cough: The patient is questioning whether he should have antibiotics and albuterol with the primary team.  He is getting bronchodilators.  Margaretta Chittum  1:19 PM  06/02/2023

## 2023-06-02 NOTE — ED Provider Notes (Signed)
[place] EMERGENCY DEPARTMENT AT [facility] Provider Note   CSN: [id number] Arrival date & time: 06/02/23  0040     History  Chief Complaint  Patient presents with   Shortness of Breath    Pt bib ems for SOB ongoing for 3 days worsening today, productive cough with clear mucus. With EMS rhonchi, inspiratory/expiratory wheezing. Hx- CHF, asthma, COPD 88% RA   Cough    Robert Church is a 67 y.o. male.  The history is provided by the patient.  Shortness of Breath Severity:  Severe Onset quality:  Gradual Duration:  3 days Timing:  Constant Progression:  Worsening Chronicity:  Recurrent Context: not fumes and not URI   Relieved by:  Nothing Worsened by:  Nothing Ineffective treatments:  Inhaler Associated symptoms: cough and wheezing   Associated symptoms: no chest pain, no fever and no vomiting   Risk factors: no prolonged immobilization   Patient with COPD and CHF presents with SOB for several days that is worsening.  Also cough productive of clear sputum.      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 Authorizing Provider  albuterol (PROVENTIL) (2.5 MG/3ML) 0.083% nebulizer solution Take 3 mLs (2.5 mg total) by nebulization every 4 (four) hours as needed for wheezing or shortness of breath. 11/26/22   Leslye Peer,  MD  albuterol (VENTOLIN HFA) 108 (90 Base) MCG/ACT inhaler INHALE 2 PUFFS INTO THE LUNGS EVERY 6 HOURS AS NEEDED FOR WHEEZING 02/09/23   Leslye Peer, MD  allopurinol (ZYLOPRIM) 300 MG tablet Take 1 tablet (300 mg total) by mouth daily. 03/18/23   [name], MD  ALPRAZolam Prudy Feeler) 0.5 MG tablet Take 1 tablet (0.5 mg total) by mouth 3 (three) times daily. 04/05/23   [name], MD  AMBULATORY NON FORMULARY MEDICATION Medication Name: Nitroglycerin gel 0.125% apply 2-3 times daily to the anal canal. Patient taking differently: Place 1 Application rectally daily as needed (Fissure). Medication Name: Nitroglycerin gel 0.125% apply 2-3 times daily to the anal canal. 01/08/22   Zehr, Princella Pellegrini, PA-C  colchicine 0.6 MG tablet Take 0.6 mg by mouth daily as needed (gout pain).    [provider]  DALIRESP 500 MCG TABS tablet TAKE 1 TABLET(500 MCG) BY MOUTH DAILY 03/21/23   Leslye Peer, MD  diclofenac Sodium (VOLTAREN) 1 % GEL Apply 1 Application topically daily at 12 noon.    [provider]  EPINEPHrine 0.3 mg/0.3 mL IJ SOAJ injection Inject 0.3 mg into the muscle as needed for anaphylaxis. 07/13/22   [name], MD  fluticasone (FLONASE) 50 MCG/ACT nasal spray SHAKE LIQUID AND USE 2 SPRAYS IN  EACH NOSTRIL DAILY Patient taking differently: Place 1 spray into both nostrils daily as needed for allergies. 06/29/22   Leslye Peer, MD  guaiFENesin (MUCINEX) 600 MG 12 hr tablet Take 1 tablet (600 mg total) by mouth 2 (two) times daily. Patient taking differently: Take 600 mg by mouth 2 (two) times daily as needed for cough or to loosen phlegm. 12/17/21   Leslye Peer, MD  icosapent Ethyl (VASCEPA) 1 g capsule Take 2 capsules (2 g total) by mouth 2 (two) times daily. 03/19/23   [name], MD  ipratropium (ATROVENT) 0.06 % nasal spray Place 2 sprays into both nostrils 4 (four) times daily as needed for rhinitis. 03/17/23   Leslye Peer, MD  lidocaine (XYLOCAINE) 5 %  ointment Apply topically 3 (three) times daily as needed. Patient taking differently: Apply 1 Application topically daily as needed (Fissure). 09/15/22   [name], MD  Lidocaine, Anorectal, 5 % CREA Apply 1 application topically 2 (two) times daily. Patient taking differently: Apply 1 application  topically daily as needed (Fissure). 01/08/22   Zehr, Princella Pellegrini, PA-C  loratadine (CLARITIN) 10 MG tablet Take 10 mg by mouth daily.    [provider]  losartan (COZAAR) 25 MG tablet Take 1 tablet (25 mg total) by mouth daily. 01/13/23   Bensimhon, Bevelyn Buckles, MD  metolazone (ZAROXOLYN) 2.5 MG tablet Take 1 tablet (2.5 mg total) by mouth as directed. Patient taking differently: Take 2.5 mg by mouth daily as needed (swelling). 01/13/23   Bensimhon, Bevelyn Buckles, MD  mometasone-formoterol (DULERA) 100-5 MCG/ACT AERO Inhale 2 puffs into the lungs 2 (two) times daily. 12/10/22   Leslye Peer, MD  montelukast (SINGULAIR) 5 MG chewable tablet Chew 2 tablets (10 mg total) by mouth at bedtime. 05/19/23   Leslye Peer, MD  nicotine (NICODERM CQ - DOSED IN MG/24 HOURS) 14 mg/24hr patch Place 1 patch (14 mg total) onto the skin daily. 05/22/22   Leslye Peer, MD  nystatin (MYCOSTATIN) 100000 UNIT/ML suspension Take 5 mLs (500,000 Units total) by mouth in the morning, at noon, and at bedtime. 05/19/23   Leslye Peer, MD  omeprazole (PRILOSEC) 20 MG capsule TAKE 1 CAPSULE(20 MG) BY MOUTH TWICE DAILY BEFORE A MEAL FOR ACID REFLUX 04/28/23   [name], MD  potassium chloride SA (KLOR-CON M20) 20 MEQ tablet Take 1 tablet (20 mEq total) by mouth daily. Take an extra 40 meq when you take metolazone 01/18/23   Bensimhon, Bevelyn Buckles, MD  pravastatin (PRAVACHOL) 10 MG tablet Take 1 tablet (10 mg total) by mouth daily. 03/19/23   [name], MD  predniSONE (DELTASONE) 10 MG tablet TAKE 1 TABLET BY MOUTH EVERY DAY WITH BREAKFAST 04/28/23   [name], MD  predniSONE (DELTASONE) 10 MG tablet Take 4 tablets  (40 mg total) by mouth daily with breakfast for 5 days. 05/28/23 06/02/23  Cobb, Ruby Cola, NP  Semaglutide (RYBELSUS) 3 MG TABS Take 1 tablet (3 mg total) by mouth daily. 03/17/23   [name], MD  Testosterone 20.25 MG/1.25GM (1.62%) GEL Place 1.25 g onto the skin daily. Patient taking differently: Place 1 Pump onto the skin every other day. 06/29/22   [name], MD  Tiotropium Bromide Monohydrate (SPIRIVA RESPIMAT) 2.5 MCG/ACT AERS Inhale 2 puffs into the lungs daily. 05/28/23   Leslye Peer, MD  torsemide (DEMADEX) 20 MG tablet Take 1 tablet (20 mg total) by mouth 2 (two) times daily. 04/28/23  [name], MD  vitamin B-12 (CYANOCOBALAMIN) 1000 MCG tablet Take 1,000 mcg by mouth daily.    [provider]      Allergies    Bee venom, Cefdinir, Spironolactone, Daliresp [roflumilast], Jardiance [empagliflozin], Penicillins, Clindamycin, Doxycycline, E-mycin [erythromycin base], and Rosuvastatin    Review of Systems   Review of Systems  Constitutional:  Negative for fever.  HENT:  Negative for facial swelling.   Eyes:  Negative for redness.  Respiratory:  Positive for cough, shortness of breath and wheezing.   Cardiovascular:  Negative for chest pain.  Gastrointestinal:  Negative for vomiting.  All other systems reviewed and are negative.   Physical Exam Updated Vital Signs BP (!) 149/93 (BP Location: Right Arm)   Pulse (!) 105   Temp 98.8 F (37.1 C) (Oral)   Resp (!) 22   Ht 5\' 10"  (1.778 m)   Wt 106.6 kg   SpO2 95%   BMI 33.72 kg/m  Physical Exam Vitals and nursing note reviewed.  Constitutional:      General: He is not in acute distress.    Appearance: Normal appearance. He is well-developed. He is not diaphoretic.  HENT:     Head: Normocephalic and atraumatic.     Nose: Nose normal.  Eyes:     Conjunctiva/sclera: Conjunctivae normal.     Pupils: Pupils are equal, round, and reactive to light.  Cardiovascular:     Rate and Rhythm: Normal rate  and regular rhythm.  Pulmonary:     Effort: Pulmonary effort is normal. Tachypnea present.     Breath sounds: Wheezing and rales present.  Abdominal:     General: Bowel sounds are normal.     Palpations: Abdomen is soft.     Tenderness: There is no abdominal tenderness. There is no guarding or rebound.  Musculoskeletal:        General: Normal range of motion.     Cervical back: Normal range of motion and neck supple.  Skin:    General: Skin is warm and dry.     Capillary Refill: Capillary refill takes less than 2 seconds.  Neurological:     General: No focal deficit present.     Mental Status: He is alert and oriented to person, place, and time.     Deep Tendon Reflexes: Reflexes normal.  Psychiatric:        Mood and Affect: Mood normal.        Behavior: Behavior normal.     ED Results / Procedures / Treatments   Labs (all labs ordered are listed, but only abnormal results are displayed) Results for orders placed or performed during the hospital encounter of 06/02/23  Resp panel by RT-PCR (RSV, Flu A&B, Covid) Anterior Nasal Swab   Specimen: Anterior Nasal Swab  Result Value Ref Range   SARS Coronavirus 2 by RT PCR NEGATIVE NEGATIVE   Influenza A by PCR NEGATIVE NEGATIVE   Influenza B by PCR NEGATIVE NEGATIVE   Resp Syncytial Virus by PCR NEGATIVE NEGATIVE  CBC with Differential  Result Value Ref Range   WBC 9.3 4.0 - 10.5 K/uL   RBC 5.01 4.22 - 5.81 MIL/uL   Hemoglobin 16.4 13.0 - 17.0 g/dL   HCT 16.1 09.6 - 04.5 %   MCV 98.0 80.0 - 100.0 fL   MCH 32.7 26.0 - 34.0 pg   MCHC 33.4 30.0 - 36.0 g/dL   RDW 40.9 81.1 - 91.4 %   Platelets 178 150 - 400 K/uL  nRBC 0.0 0.0 - 0.2 %   Neutrophils Relative % 83 %   Neutro Abs 7.8 (H) 1.7 - 7.7 K/uL   Lymphocytes Relative 7 %   Lymphs Abs 0.7 0.7 - 4.0 K/uL   Monocytes Relative 9 %   Monocytes Absolute 0.8 0.1 - 1.0 K/uL   Eosinophils Relative 1 %   Eosinophils Absolute 0.1 0.0 - 0.5 K/uL   Basophils Relative 0 %    Basophils Absolute 0.0 0.0 - 0.1 K/uL   Immature Granulocytes 0 %   Abs Immature Granulocytes 0.04 0.00 - 0.07 K/uL  Basic metabolic panel  Result Value Ref Range   Sodium 136 135 - 145 mmol/L   Potassium 3.4 (L) 3.5 - 5.1 mmol/L   Chloride 97 (L) 98 - 111 mmol/L   CO2 29 22 - 32 mmol/L   Glucose, Bld 118 (H) 70 - 99 mg/dL   BUN 17 8 - 23 mg/dL   Creatinine, Ser 0.98 0.61 - 1.24 mg/dL   Calcium 9.1 8.9 - 11.9 mg/dL   GFR, Estimated >14 >78 mL/min   Anion gap 10 5 - 15  Troponin I (High Sensitivity)  Result Value Ref Range   Troponin I (High Sensitivity) 79 (H) <18 ng/L   *Note: Due to a large number of results and/or encounters for the requested time period, some results have not been displayed. A complete set of results can be found in Results Review.   DG Chest Portable 1 View  Result Date: 06/02/2023 CLINICAL DATA:  Chest congestion and shortness of breath. EXAM: PORTABLE CHEST 1 VIEW COMPARISON:  November 11, 2022 FINDINGS: There is stable dual lead AICD positioning. An additional stable right chest a vagal stimulator is seen. The cardiac silhouette is mildly enlarged and unchanged in size. Mild, diffusely increased interstitial lung markings are noted with mild atelectasis seen within the mid left lung and bilateral lung bases. No pleural effusion or pneumothorax is identified. The visualized skeletal structures are unremarkable. IMPRESSION: Stable cardiomegaly with mild interstitial edema with mild bilateral mid left lung and bibasilar atelectasis. Electronically Signed   By: Aram Candela M.D.   On: 06/02/2023 01:30   CUP PACEART REMOTE DEVICE CHECK  Result Date: 05/19/2023 Scheduled remote reviewed. Normal device function.  1 AHR x 4 sec. Next remote 91 days. , CVRS   EKG EKG Interpretation  Date/Time:  Wednesday June 02 2023 00:58:19 EDT Ventricular Rate:  105 PR Interval:    QRS Duration: 166 QT Interval:  366 QTC Calculation: 484 R Axis:   -54 Text  Interpretation: Atrial fibrillation IVCD, consider atypical RBBB LVH with IVCD, LAD and secondary repol abnrm Confirmed by Raford Brissett ([id number]) on 06/02/2023 2:39:41 AM  Radiology DG Chest Portable 1 View  Result Date: 06/02/2023 CLINICAL DATA:  Chest congestion and shortness of breath. EXAM: PORTABLE CHEST 1 VIEW COMPARISON:  November 11, 2022 FINDINGS: There is stable dual lead AICD positioning. An additional stable right chest a vagal stimulator is seen. The cardiac silhouette is mildly enlarged and unchanged in size. Mild, diffusely increased interstitial lung markings are noted with mild atelectasis seen within the mid left lung and bilateral lung bases. No pleural effusion or pneumothorax is identified. The visualized skeletal structures are unremarkable. IMPRESSION: Stable cardiomegaly with mild interstitial edema with mild bilateral mid left lung and bibasilar atelectasis. Electronically Signed   By: Aram Candela M.D.   On: 06/02/2023 01:30    Procedures Procedures    Medications Ordered in ED Medications  nicotine (NICODERM  CQ - dosed in mg/24 hours) patch 14 mg (14 mg Transdermal Patch Applied 06/02/23 0127)  furosemide (LASIX) injection 40 mg (has no administration in time range)  albuterol (PROVENTIL) (2.5 MG/3ML) 0.083% nebulizer solution 5 mg (5 mg Nebulization Given 06/02/23 0126)  ipratropium (ATROVENT) nebulizer solution 0.5 mg (0.5 mg Nebulization Given 06/02/23 0126)  methylPREDNISolone sodium succinate (SOLU-MEDROL) 125 mg/2 mL injection 125 mg (125 mg Intravenous Given 06/02/23 0127)    ED Course/ Medical Decision Making/ A&P                             Medical Decision Making Patient with SOB  Amount and/or Complexity of Data Reviewed Independent Historian: EMS    Details: Patient with COPD with SOB and cough  External Data Reviewed: notes. Labs: ordered.    Details: Elevated troponin 79, negative covid and flu, normal white count 9.3, normal hemoglobin 16.4,  normal platelets 178.  Normal sodium 136, potassium slight low 3.4, normal creatinine .9  Radiology: ordered and independent interpretation performed.    Details: Chf by me on CXR ECG/medicine tests: ordered and independent interpretation performed. Decision-making details documented in ED Course.  Risk OTC drugs. Prescription drug management. Decision regarding hospitalization. Risk Details: Patient for CHF, home medications not working will need inpatient care.      Final Clinical Impression(s) / ED Diagnoses Final diagnoses:  Acute congestive heart failure, unspecified heart failure type (HCC)  Elevated troponin I level   The patient appears reasonably stabilized for admission considering the current resources, flow, and capabilities available in the ED at this time, and I doubt any other [facility] requiring further screening and/or treatment in the ED prior to admission.  Rx / DC Orders ED Discharge Orders     None         Marriana Hibberd, MD 06/02/23 [phone]

## 2023-06-02 NOTE — H&P (Signed)
History and Physical  [name]:[id number] DOB: Dec 29, 1955 DOA: 06/02/2023  Referring physician: Dr. Nicanor Alcon, EDP  PCP: [name], MD  Outpatient Specialists: Cardiology. Patient coming from: Home.  Chief Complaint: Shortness of breath and legs swelling despite taking home diuretics.  HPI: Robert WOHLER is a 67 y.o. male with medical history significant for HFrEF less than 20%, type 2 diabetes, hypertension, GERD, obesity, chronic anxiety, OSA on BiPAP, current tobacco user, who presented to [facility] ED from home due to progressive shortness of breath and worsening bilateral lower extremity edema x 3 days despite compliance with home diuretics.  Associated with a productive cough with clear mucus.  Endorses pleuritic chest pain worse with coughing.  In the ED, mild wheezing noted on exam as well as bilateral lower extremity pitting edema.  High-sensitivity troponin peaked at 79 and down trended.  BNP elevated greater than 450, chest x-ray revealed the following findings:  Stable cardiomegaly with mild interstitial edema with mild bilateral mid left lung and bibasilar atelectasis.  The patient was started on IV diuretics in the ED.  TR[facility], hospitalist service, was asked to admit.  ED Course: Temperature 97.7.  BP 139/96, pulse 109, respiratory 23, saturation 96% on 2 L.  Lab studies remarkable for serum potassium 3.4, glucose 118.  Review of Systems: Review of systems as noted in the HPI. All other systems reviewed and are negative.   Past Medical History:  Diagnosis Date   Adenomatous colon polyp    Allergy    Anal fissure    Arthritis    Asthma    Bronchitis    CHF (congestive heart failure) (HCC)    COPD, group D, by GOLD 2017 classification (HCC)    Dyspnea    Emphysema lung (HCC)    GERD (gastroesophageal reflux disease)    History of hiatal hernia    Hyperlipidemia    Hypertension    NICM (nonischemic cardiomyopathy) (HCC)    OSA (obstructive sleep apnea)     Pneumonia    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   Requires supplemental oxygen    Sinusitis    SOB (shortness of breath)    Past Surgical History:  Procedure Laterality Date   BIV UPGRADE N/A 10/07/2021   Procedure: BIV PPM UPGRADE;  Surgeon: Regan Lemming, MD;  Location: [facility] INVASIVE CV LAB;  Service: Cardiovascular;  Laterality: N/A;   BIV UPGRADE N/A 11/11/2022   Procedure: BIV PPM UPGRADE;  Surgeon: Regan Lemming, MD;  Location: [facility] INVASIVE CV LAB;  Service: Cardiovascular;  Laterality: N/A;   LEFT HEART CATH AND CORONARY ANGIOGRAPHY N/A 04/19/2017   Procedure: Left Heart Cath and Coronary Angiography;  Surgeon: Lyn Records, MD;  Location: [facility] INVASIVE CV LAB;  Service: Cardiovascular;  Laterality: N/A;   PACEMAKER IMPLANT N/A 04/16/2021   Procedure: PACEMAKER IMPLANT;  Surgeon: Regan Lemming, MD;  Location: [facility] INVASIVE CV LAB;  Service: Cardiovascular;  Laterality: N/A;   RIGHT HEART CATH N/A 03/09/2023   Procedure: RIGHT HEART CATH;  Surgeon: Dolores Patty, MD;  Location: [facility] INVASIVE CV LAB;  Service: Cardiovascular;  Laterality: N/A;   RIGHT/LEFT HEART CATH AND CORONARY ANGIOGRAPHY N/A 08/22/2021   Procedure: RIGHT/LEFT HEART CATH AND CORONARY ANGIOGRAPHY;  Surgeon: Dolores Patty, MD;  Location: [facility] INVASIVE CV LAB;  Service: Cardiovascular;  Laterality: N/A;   TONSILLECTOMY      Social History:  reports that he has been smoking cigarettes. He has a 92.00  pack-year smoking history. He has been exposed to tobacco smoke. He has never used smokeless tobacco. He reports that he does not currently use alcohol after a past usage of about 28.0 standard drinks of alcohol per week. He reports that he does not use drugs.   Allergies  Allergen Reactions   Bee Venom Shortness Of Breath and Swelling   Cefdinir Other (See Comments)    Patient states he couldn't really breath    Spironolactone Anaphylaxis   Daliresp [Roflumilast] Other (See  Comments)    Dizzy, headache, leg pain per patient. 02/25/22. Tolerates in low doses    Jardiance [Empagliflozin] Nausea Only and Other (See Comments)    Lightheadness Dizziness   Penicillins Swelling and Other (See Comments)    Childhood rxn--MD stated he "almost died" Has patient had a PCN reaction causing immediate rash, facial/tongue/throat swelling, SOB or lightheadedness with hypotension:Yes Has patient had a PCN reaction causing severe rash involving mucus membranes or skin necrosis:unsure Has patient had a PCN reaction that required hospitalization:unsure Has patient had a PCN reaction occurring within the last 10 years:No If all of the above answers are "NO", then may proceed with Cephalosporin use.     Clindamycin Other (See Comments)    Tongue swelling   Doxycycline Nausea And Vomiting    Severe stomach upset per patient   E-Mycin [Erythromycin Base] Swelling   Rosuvastatin Other (See Comments)    Myalgias (intolerance)    Family History  Problem Relation Age of Onset   Lung cancer Father    High blood pressure Mother    Diabetes Maternal Grandmother    Colon polyps Neg Hx    Esophageal cancer Neg Hx    Pancreatic cancer Neg Hx    Stomach cancer Neg Hx       Prior to Admission medications   Medication Sig Start Date End Date Taking? Authorizing Provider  albuterol (PROVENTIL) (2.5 MG/3ML) 0.083% nebulizer solution Take 3 mLs (2.5 mg total) by nebulization every 4 (four) hours as needed for wheezing or shortness of breath. 11/26/22  Yes Leslye Peer, MD  albuterol (VENTOLIN HFA) 108 (90 Base) MCG/ACT inhaler INHALE 2 PUFFS INTO THE LUNGS EVERY 6 HOURS AS NEEDED FOR WHEEZING 02/09/23  Yes Byrum, Les Pou, MD  allopurinol (ZYLOPRIM) 300 MG tablet Take 1 tablet (300 mg total) by mouth daily. 03/18/23  Yes [name], MD  ALPRAZolam Prudy Feeler) 0.5 MG tablet Take 1 tablet (0.5 mg total) by mouth 3 (three) times daily. Patient taking differently: Take 0.5 mg by mouth 3  (three) times daily as needed for anxiety. 04/05/23  Yes [name], MD  AMBULATORY NON FORMULARY MEDICATION Medication Name: Nitroglycerin gel 0.125% apply 2-3 times daily to the anal canal. Patient taking differently: Place 1 Application rectally daily as needed (Fissure). Medication Name: Nitroglycerin gel 0.125% apply 2-3 times daily to the anal canal. 01/08/22  Yes Zehr, Princella Pellegrini, PA-C  DALIRESP 500 MCG TABS tablet TAKE 1 TABLET(500 MCG) BY MOUTH DAILY 03/21/23  Yes Byrum, Les Pou, MD  diclofenac Sodium (VOLTAREN) 1 % GEL Apply 1 Application topically in the morning and at bedtime.   Yes [provider]  EPINEPHrine 0.3 mg/0.3 mL IJ SOAJ injection Inject 0.3 mg into the muscle as needed for anaphylaxis. 07/13/22  Yes [name], MD  fluticasone (FLONASE) 50 MCG/ACT nasal spray SHAKE LIQUID AND USE 2 SPRAYS IN EACH NOSTRIL DAILY Patient taking differently: Place 1 spray into both nostrils daily as needed for allergies.  06/29/22  Yes Leslye Peer, MD  guaiFENesin (MUCINEX) 600 MG 12 hr tablet Take 1 tablet (600 mg total) by mouth 2 (two) times daily. Patient taking differently: Take 600 mg by mouth 2 (two) times daily as needed for cough or to loosen phlegm. 12/17/21  Yes Leslye Peer, MD  ipratropium (ATROVENT) 0.06 % nasal spray Place 2 sprays into both nostrils 4 (four) times daily as needed for rhinitis. 03/17/23  Yes Leslye Peer, MD  lidocaine (XYLOCAINE) 5 % ointment Apply topically 3 (three) times daily as needed. Patient taking differently: Apply 1 Application topically daily as needed (Fissure). 09/15/22  Yes [name], MD  Lidocaine, Anorectal, 5 % CREA Apply 1 application topically 2 (two) times daily. Patient taking differently: Apply 1 application  topically daily as needed (Fissure). 01/08/22  Yes Zehr, Princella Pellegrini, PA-C  loratadine (CLARITIN) 10 MG tablet Take 10 mg by mouth daily.   Yes [provider]  losartan (COZAAR) 25 MG tablet Take 1  tablet (25 mg total) by mouth daily. 01/13/23  Yes Bensimhon, Bevelyn Buckles, MD  metolazone (ZAROXOLYN) 2.5 MG tablet Take 1 tablet (2.5 mg total) by mouth as directed. Patient taking differently: Take 2.5 mg by mouth daily as needed (swelling). 01/13/23  Yes Bensimhon, Bevelyn Buckles, MD  mometasone-formoterol (DULERA) 100-5 MCG/ACT AERO Inhale 2 puffs into the lungs 2 (two) times daily. 12/10/22  Yes Leslye Peer, MD  nicotine (NICODERM CQ - DOSED IN MG/24 HOURS) 14 mg/24hr patch Place 1 patch (14 mg total) onto the skin daily. 05/22/22  Yes Leslye Peer, MD  nystatin (MYCOSTATIN) 100000 UNIT/ML suspension Take 5 mLs (500,000 Units total) by mouth in the morning, at noon, and at bedtime. 05/19/23  Yes Leslye Peer, MD  omeprazole (PRILOSEC) 20 MG capsule TAKE 1 CAPSULE(20 MG) BY MOUTH TWICE DAILY BEFORE A MEAL FOR ACID REFLUX 04/28/23  Yes [name], MD  potassium chloride SA (KLOR-CON M20) 20 MEQ tablet Take 1 tablet (20 mEq total) by mouth daily. Take an extra 40 meq when you take metolazone 01/18/23  Yes Bensimhon, Bevelyn Buckles, MD  predniSONE (DELTASONE) 10 MG tablet Take 4 tablets (40 mg total) by mouth daily with breakfast for 5 days. Patient taking differently: Take 20 mg by mouth daily with breakfast. 05/28/23 06/02/23 Yes Cobb, Ruby Cola, NP  Testosterone 20.25 MG/1.25GM (1.62%) GEL Place 1.25 g onto the skin daily. Patient taking differently: Place 1 Pump onto the skin every other day. 06/29/22  Yes [name], MD  Tiotropium Bromide Monohydrate (SPIRIVA RESPIMAT) 2.5 MCG/ACT AERS Inhale 2 puffs into the lungs daily. 05/28/23  Yes Leslye Peer, MD  torsemide (DEMADEX) 20 MG tablet Take 1 tablet (20 mg total) by mouth 2 (two) times daily. 04/28/23  Yes [name], MD  vitamin B-12 (CYANOCOBALAMIN) 1000 MCG tablet Take 1,000 mcg by mouth daily.   Yes [provider]  icosapent Ethyl (VASCEPA) 1 g capsule Take 2 capsules (2 g total) by mouth 2 (two) times daily. Patient not taking:  Reported on 06/02/2023 03/19/23   [name], MD  montelukast (SINGULAIR) 5 MG chewable tablet Chew 2 tablets (10 mg total) by mouth at bedtime. Patient not taking: Reported on 06/02/2023 05/19/23   Leslye Peer, MD  pravastatin (PRAVACHOL) 10 MG tablet Take 1 tablet (10 mg total) by mouth daily. Patient not taking: Reported on 06/02/2023 03/19/23   [name], MD  predniSONE (DELTASONE) 10 MG tablet TAKE  1 TABLET BY MOUTH EVERY DAY WITH BREAKFAST Patient not taking: Reported on 06/02/2023 04/28/23   [name], MD  Semaglutide (RYBELSUS) 3 MG TABS Take 1 tablet (3 mg total) by mouth daily. Patient not taking: Reported on 06/02/2023 03/17/23   [name], MD    Physical Exam: BP (!) 141/95   Pulse (!) 107   Temp 97.7 F (36.5 C) (Oral)   Resp (!) 23   Ht 5\' 10"  (1.778 m)   Wt 106.6 kg   SpO2 98%   BMI 33.72 kg/m   General: 67 y.o. year-old male well developed well nourished in no acute distress.  Alert and oriented x3. Cardiovascular: Regular rate and rhythm with no rubs or gallops.  No thyromegaly or JVD noted.  2+ pitting edema in lower extremities bilaterally. Respiratory: Mild rales at bases.  Poor inspiratory effort. Abdomen: Soft nontender nondistended with normal bowel sounds x4 quadrants. Muskuloskeletal: No cyanosis or clubbing noted bilaterally Neuro: CN II-XII intact, strength, sensation, reflexes Skin: No ulcerative lesions noted or rashes Psychiatry: Judgement and insight appear normal. Mood is appropriate for condition and setting          Labs on Admission:  Basic Metabolic Panel: Recent Labs  Lab 06/02/23 0108  NA 136  K 3.4*  CL 97*  CO2 29  GLUCOSE 118*  BUN 17  CREATININE 0.88  CALCIUM 9.1   Liver Function Tests: No results for input(s): "AST", "ALT", "ALKPHOS", "BILITOT", "PROT", "ALBUMIN" in the last 168 hours. No results for input(s): "LIPASE", "AMYLASE" in the last 168 hours. No results for input(s): "AMMONIA" in the last 168  hours. CBC: Recent Labs  Lab 06/02/23 0108  WBC 9.3  NEUTROABS 7.8*  HGB 16.4  HCT 49.1  MCV 98.0  PLT 178   Cardiac Enzymes: No results for input(s): "CKTOTAL", "CKMB", "CKMBINDEX", "TROPONINI" in the last 168 hours.  BNP (last 3 results) Recent Labs    01/13/23 1546 01/18/23 1700 06/02/23 0108  BNP 455.7* 161.1* 454.8*    ProBNP (last 3 results) Recent Labs    09/14/22 1522  PROBNP 167.0*    CBG: No results for input(s): "GLUCAP" in the last 168 hours.  Radiological Exams on Admission: DG Chest Portable 1 View  Result Date: 06/02/2023 CLINICAL DATA:  Chest congestion and shortness of breath. EXAM: PORTABLE CHEST 1 VIEW COMPARISON:  November 11, 2022 FINDINGS: There is stable dual lead AICD positioning. An additional stable right chest a vagal stimulator is seen. The cardiac silhouette is mildly enlarged and unchanged in size. Mild, diffusely increased interstitial lung markings are noted with mild atelectasis seen within the mid left lung and bilateral lung bases. No pleural effusion or pneumothorax is identified. The visualized skeletal structures are unremarkable. IMPRESSION: Stable cardiomegaly with mild interstitial edema with mild bilateral mid left lung and bibasilar atelectasis. Electronically Signed   By: Aram Candela M.D.   On: 06/02/2023 01:30     Assessment/Plan Present on Admission: **None**  Principal Problem:   CHF exacerbation (HCC)  Acute on chronic HFrEF less than 20% BNP greater than 400, by pulmonary edema on chest x-ray, bilateral lower extremity pitting edema Unclear etiology, repeat 2D echo to rule out any acute cardiac structural abnormalities. Last 2D echo done on 01/13/2023 showed LVEF less than 20% Ongoing diuresing with IV Lasix Monitor urine output, renal function and electrolytes while on IV diuretics Consider cardiology consultation in the morning. Start strict I's and O's and daily weight Replace electrolytes as  indicated  Hypokalemia in the setting of IV diuresing Serum potassium 3.4 Repleted orally Check magnesium level and replete as indicated.  OSA on BiPAP COPD on 2 L O2 supplementation as needed Resume BiPAP nightly Resume home bronchodilators Wean off oxygen supplementation as tolerated Early ambulation Incentive spirometer  Current tobacco user Smokes about 10 cigarettes/day Tobacco cessation counseling done at bedside Nicotine patch  Type 2 diabetes Last hemoglobin A1c 6.0 on 05/27/2023 Avoid hypoglycemia  Chronic anxiety Resume home regimen Reassess home doses of home Xanax.  Obesity BMI 33 Recommend weight loss outpatient with regular physical activity and healthy dieting.  GERD Resume home PPI  History of hyperuricemia Resume home allopurinol  Vitamin B12 deficiency Resume home vitamin B12 supplement    DVT prophylaxis: Subcu Lovenox daily  Code Status: Full code  Family Communication: None at bedside  Disposition Plan: Admitted to telemetry unit  Consults called: None.  Admission status: Inpatient status.   Status is: Inpatient The patient requires at least 2 midnights for further evaluation and treatment of present condition.   Darlin Drop MD [facility] Pager [phone]  If 7PM-7AM, please contact night-coverage [email or web address] Password [facility]  06/02/2023, 6:31 AM

## 2023-06-02 NOTE — Progress Notes (Signed)
   06/02/23 2030  BiPAP/CPAP/SIPAP  $ Non-Invasive Home Ventilator  Initial  $ Face Mask Large  Yes  BiPAP/CPAP/SIPAP Pt Type Adult  BiPAP/CPAP/SIPAP Resmed  Mask Type Full face mask  Mask Size Large  Respiratory Rate 18 breaths/min  FiO2 (%) 21 %  Patient Home Equipment No  Auto Titrate Yes (4-15 per pt comfort)

## 2023-06-02 NOTE — ED Triage Notes (Signed)
Pt bib ems for SOB ongoing for 3 days worsening today, productive cough with clear mucus. With EMS rhonchi, inspiratory/expiratory wheezing. Hx- CHF, asthma, COPD 88% RA, wears 2L PRN

## 2023-06-02 NOTE — ED Notes (Signed)
ED TO INPATIENT HANDOFF REPORT  Name/Age/Gender Robert Church 67 y.o. male  Code Status    Code Status Orders  (From admission, onward)           Start     Ordered   06/02/23 0329  Full code  Continuous       Question:  By:  Answer:  Consent: discussion documented in EHR   06/02/23 0329           Code Status History     Date Active Date Inactive Code Status Order ID Comments User Context   11/11/2022 1649 11/12/2022 0045 Full Code [id number]  Regan Lemming, MD Inpatient   08/22/2021 1106 08/22/2021 1804 Full Code [id number]  Dolores Patty, MD Inpatient   04/16/2021 0120 04/17/2021 1944 Full Code [id number]  Quintella Reichert, MD ED   12/20/2020 1341 12/30/2020 2107 Full Code [id number]  Joycelyn Das, MD Inpatient   12/17/2020 1646 12/20/2020 1341 Partial Code [id number]  Teddy Spike, DO Inpatient   04/19/2017 0816 04/19/2017 1456 Full Code [id number]  Lyn Records, MD Inpatient       Home/SNF/Other Home  Chief Complaint CHF exacerbation [facility]) [I50.9]  Level of Care/Admitting Diagnosis ED Disposition     ED Disposition  Admit   Condition  --   Comment  Hospital Area: [facility] [[id number]]  Level of Care: Telemetry [5]  Admit to tele based on following criteria: Monitor for Ischemic changes  May admit patient to [facility] or [facility] if equivalent level of care is available:: Yes  Covid Evaluation: Asymptomatic - no recent exposure (last 10 days) testing not required  Diagnosis: CHF exacerbation [facility]) [[id number]]  Admitting Physician: Darlin Drop [[id number]]  Attending Physician: Darlin Drop [[id number]]  Certification:: I certify this patient will need inpatient services for at least 2 midnights  Estimated Length of Stay: 2          Medical History Past Medical History:  Diagnosis Date   Adenomatous colon polyp    Allergy    Anal fissure    Arthritis    Asthma    Bronchitis    CHF (congestive heart failure) (HCC)     COPD, group D, by GOLD 2017 classification (HCC)    Dyspnea    Emphysema lung (HCC)    GERD (gastroesophageal reflux disease)    History of hiatal hernia    Hyperlipidemia    Hypertension    NICM (nonischemic cardiomyopathy) (HCC)    OSA (obstructive sleep apnea)    Pneumonia    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   Requires supplemental oxygen    Sinusitis    SOB (shortness of breath)     Allergies Allergies  Allergen Reactions   Bee Venom Shortness Of Breath and Swelling   Cefdinir Other (See Comments)    Patient states he couldn't really breath    Spironolactone Anaphylaxis   Daliresp [Roflumilast] Other (See Comments)    Dizzy, headache, leg pain per patient. 02/25/22. Tolerates in low doses    Jardiance [Empagliflozin] Nausea Only and Other (See Comments)    Lightheadness Dizziness   Penicillins Swelling and Other (See Comments)    Childhood rxn--MD stated he "almost died" Has patient had a PCN reaction causing immediate rash, facial/tongue/throat swelling, SOB or lightheadedness with hypotension:Yes Has patient had a PCN reaction causing severe rash involving mucus membranes or skin necrosis:unsure Has patient had a PCN reaction that required hospitalization:unsure  Has patient had a PCN reaction occurring within the last 10 years:No If all of the above answers are "NO", then may proceed with Cephalosporin use.     Clindamycin Other (See Comments)    Tongue swelling   Doxycycline Nausea And Vomiting    Severe stomach upset per patient   E-Mycin [Erythromycin Base] Swelling   Rosuvastatin Other (See Comments)    Myalgias (intolerance)    IV Location/Drains/Wounds Patient Lines/Drains/Airways Status     Active Line/Drains/Airways     Name Placement date Placement time Site Days   Peripheral IV 06/02/23 20 G 1" Anterior;Distal;Left;Upper Arm 06/02/23  0127  Arm  less than 1            Labs/Imaging Results for orders placed or performed  during the hospital encounter of 06/02/23 (from the past 48 hour(s))  CBC with Differential     Status: Abnormal   Collection Time: 06/02/23  1:08 AM  Result Value Ref Range   WBC 9.3 4.0 - 10.5 K/uL   RBC 5.01 4.22 - 5.81 MIL/uL   Hemoglobin 16.4 13.0 - 17.0 g/dL   HCT 01.0 93.2 - 35.5 %   MCV 98.0 80.0 - 100.0 fL   MCH 32.7 26.0 - 34.0 pg   MCHC 33.4 30.0 - 36.0 g/dL   RDW 73.2 20.2 - 54.2 %   Platelets 178 150 - 400 K/uL   nRBC 0.0 0.0 - 0.2 %   Neutrophils Relative % 83 %   Neutro Abs 7.8 (H) 1.7 - 7.7 K/uL   Lymphocytes Relative 7 %   Lymphs Abs 0.7 0.7 - 4.0 K/uL   Monocytes Relative 9 %   Monocytes Absolute 0.8 0.1 - 1.0 K/uL   Eosinophils Relative 1 %   Eosinophils Absolute 0.1 0.0 - 0.5 K/uL   Basophils Relative 0 %   Basophils Absolute 0.0 0.0 - 0.1 K/uL   Immature Granulocytes 0 %   Abs Immature Granulocytes 0.04 0.00 - 0.07 K/uL    Comment: Performed at [facility], [address] [place], [place] [address]  Basic metabolic panel     Status: Abnormal   Collection Time: 06/02/23  1:08 AM  Result Value Ref Range   Sodium 136 135 - 145 mmol/L   Potassium 3.4 (L) 3.5 - 5.1 mmol/L   Chloride 97 (L) 98 - 111 mmol/L   CO2 29 22 - 32 mmol/L   Glucose, Bld 118 (H) 70 - 99 mg/dL    Comment: Glucose reference range applies only to samples taken after fasting for at least 8 hours.   BUN 17 8 - 23 mg/dL   Creatinine, Ser 7.62 0.61 - 1.24 mg/dL   Calcium 9.1 8.9 - 83.1 mg/dL   GFR, Estimated >51 >76 mL/min    Comment: (NOTE) Calculated using the CKD-EPI Creatinine Equation (2021)    Anion gap 10 5 - 15    Comment: Performed at [facility], [address], [place], [place] [address]  Troponin I (High Sensitivity)     Status: Abnormal   Collection Time: 06/02/23  1:08 AM  Result Value Ref Range   Troponin I (High Sensitivity) 79 (H) <18 ng/L    Comment: (NOTE) Elevated high sensitivity troponin I (hsTnI) values and significant   changes across serial measurements may suggest ACS but many other  chronic and acute conditions are known to elevate hsTnI results.  Refer to the "Links" section for chest pain algorithms and additional  guidance. Performed at  [facility], [address], [place], [place] [address]   Resp panel by RT-PCR (RSV, Flu A&B, Covid) Anterior Nasal Swab     Status: None   Collection Time: 06/02/23  1:08 AM   Specimen: Anterior Nasal Swab  Result Value Ref Range   SARS Coronavirus 2 by RT PCR NEGATIVE NEGATIVE    Comment: (NOTE) SARS-CoV-2 target nucleic acids are NOT DETECTED.  The SARS-CoV-2 RNA is generally detectable in upper respiratory specimens during the acute phase of infection. The lowest concentration of SARS-CoV-2 viral copies this assay can detect is 138 copies/mL. A negative result does not preclude SARS-Cov-2 infection and should not be used as the sole basis for treatment or other patient management decisions. A negative result may occur with  improper specimen collection/handling, submission of specimen other than nasopharyngeal swab, presence of viral mutation(s) within the areas targeted by this assay, and inadequate number of viral copies(<138 copies/mL). A negative result must be combined with clinical observations, patient history, and epidemiological information. The expected result is Negative.  Fact Sheet for Patients:  BloggerCourse.com  Fact Sheet for Healthcare Providers:  SeriousBroker.it  This test is no t yet approved or cleared by the [place] FDA and  has been authorized for detection and/or diagnosis of SARS-CoV-2 by FDA under an Emergency Use Authorization (EUA). This EUA will remain  in effect (meaning this test can be used) for the duration of the COVID-19 declaration under Section 564(b)(1) of the Act, 21 U.S.C.section 360bbb-3(b)(1), unless the authorization is terminated   or revoked sooner.       Influenza A by PCR NEGATIVE NEGATIVE   Influenza B by PCR NEGATIVE NEGATIVE    Comment: (NOTE) The Xpert Xpress SARS-CoV-2/FLU/RSV plus assay is intended as an aid in the diagnosis of influenza from Nasopharyngeal swab specimens and should not be used as a sole basis for treatment. Nasal washings and aspirates are unacceptable for Xpert Xpress SARS-CoV-2/FLU/RSV testing.  Fact Sheet for Patients: BloggerCourse.com  Fact Sheet for Healthcare Providers: SeriousBroker.it  This test is not yet approved or cleared by the [place] FDA and has been authorized for detection and/or diagnosis of SARS-CoV-2 by FDA under an Emergency Use Authorization (EUA). This EUA will remain in effect (meaning this test can be used) for the duration of the COVID-19 declaration under Section 564(b)(1) of the Act, 21 U.S.C. section 360bbb-3(b)(1), unless the authorization is terminated or revoked.     Resp Syncytial Virus by PCR NEGATIVE NEGATIVE    Comment: (NOTE) Fact Sheet for Patients: BloggerCourse.com  Fact Sheet for Healthcare Providers: SeriousBroker.it  This test is not yet approved or cleared by the [place] FDA and has been authorized for detection and/or diagnosis of SARS-CoV-2 by FDA under an Emergency Use Authorization (EUA). This EUA will remain in effect (meaning this test can be used) for the duration of the COVID-19 declaration under Section 564(b)(1) of the Act, 21 U.S.C. section 360bbb-3(b)(1), unless the authorization is terminated or revoked.  Performed at [facility], [address] [place], [place] [address]   Brain natriuretic peptide     Status: Abnormal   Collection Time: 06/02/23  1:08 AM  Result Value Ref Range   B Natriuretic Peptide 454.8 (H) 0.0 - 100.0 pg/mL    Comment: Performed at [facility], [address], [place], [place] [address]  Troponin I (High Sensitivity)     Status: Abnormal   Collection Time: 06/02/23  3:19 AM  Result  Value Ref Range   Troponin I (High Sensitivity) 71 (H) <18 ng/L    Comment: (NOTE) Elevated high sensitivity troponin I (hsTnI) values and significant  changes across serial measurements may suggest ACS but many other  chronic and acute conditions are known to elevate hsTnI results.  Refer to the "Links" section for chest pain algorithms and additional  guidance. Performed at [facility], [address] [place], [place] [address]   Troponin I (High Sensitivity)     Status: Abnormal   Collection Time: 06/02/23  5:58 AM  Result Value Ref Range   Troponin I (High Sensitivity) 93 (H) <18 ng/L    Comment: DELTA CHECK NOTED RESULT CALLED TO, READ BACK BY AND VERIFIED WITH WOODY,A. RN AT 6301 06/02/23 MULLINS,T (NOTE) Elevated high sensitivity troponin I (hsTnI) values and significant  changes across serial measurements may suggest ACS but many other  chronic and acute conditions are known to elevate hsTnI results.  Refer to the "Links" section for chest pain algorithms and additional  guidance. Performed at [facility], [address] [place], [place] [address]    *Note: Due to a large number of results and/or encounters for the requested time period, some results have not been displayed. A complete set of results can be found in Results Review.   DG Chest Portable 1 View  Result Date: 06/02/2023 CLINICAL DATA:  Chest congestion and shortness of breath. EXAM: PORTABLE CHEST 1 VIEW COMPARISON:  November 11, 2022 FINDINGS: There is stable dual lead AICD positioning. An additional stable right chest a vagal stimulator is seen. The cardiac silhouette is mildly enlarged and unchanged in size. Mild, diffusely increased interstitial lung markings are noted with mild atelectasis seen within the mid left  lung and bilateral lung bases. No pleural effusion or pneumothorax is identified. The visualized skeletal structures are unremarkable. IMPRESSION: Stable cardiomegaly with mild interstitial edema with mild bilateral mid left lung and bibasilar atelectasis. Electronically Signed   By: Aram Candela M.D.   On: 06/02/2023 01:30    Pending Labs Unresulted Labs (From admission, onward)     Start     Ordered   06/09/23 0500  Creatinine, serum  (enoxaparin (LOVENOX)    CrCl >/= 30 ml/min)  Weekly,   R     Comments: while on enoxaparin therapy    06/02/23 0329   06/02/23 0500  HIV Antibody (routine testing w rflx)  (HIV Antibody (Routine testing w reflex) panel)  Once,   R        06/02/23 0333            Vitals/Pain Today's Vitals   06/02/23 0830 06/02/23 0930 06/02/23 1019 06/02/23 1030  BP: 123/86 (!) 138/100  (!) 139/106  Pulse: (!) 112 (!) 101  99  Resp: (!) 21 20  20   Temp:   (!) 97.4 F (36.3 C)   TempSrc:   Oral   SpO2: 98% 97%  98%  Weight:      Height:      PainSc:        Isolation Precautions No active isolations  Medications Medications  nicotine (NICODERM CQ - dosed in mg/24 hours) patch 14 mg (14 mg Transdermal Patch Applied 06/02/23 0127)  enoxaparin (LOVENOX) injection 40 mg (40 mg Subcutaneous Given 06/02/23 1016)  furosemide (LASIX) injection 40 mg (40 mg Intravenous Given 06/02/23 0401)  ipratropium-albuterol (DUONEB) 0.5-2.5 (3) MG/3ML nebulizer solution 3 mL (3 mLs Nebulization Given 06/02/23 0756)  ALPRAZolam (XANAX) tablet  0.25 mg (has no administration in time range)  albuterol (PROVENTIL) (2.5 MG/3ML) 0.083% nebulizer solution 2.5 mg (2.5 mg Nebulization Given 06/02/23 1044)  allopurinol (ZYLOPRIM) tablet 300 mg (300 mg Oral Given 06/02/23 1017)  mometasone-formoterol (DULERA) 100-5 MCG/ACT inhaler 2 puff (has no administration in time range)  cyanocobalamin (VITAMIN B12) tablet 1,000 mcg (1,000 mcg Oral Given 06/02/23 1017)  potassium chloride SA  (KLOR-CON M) CR tablet 40 mEq (40 mEq Oral Given 06/02/23 1017)  losartan (COZAAR) tablet 25 mg (has no administration in time range)  albuterol (PROVENTIL) (2.5 MG/3ML) 0.083% nebulizer solution 5 mg (5 mg Nebulization Given 06/02/23 0126)  ipratropium (ATROVENT) nebulizer solution 0.5 mg (0.5 mg Nebulization Given 06/02/23 0126)  methylPREDNISolone sodium succinate (SOLU-MEDROL) 125 mg/2 mL injection 125 mg (125 mg Intravenous Given 06/02/23 0127)  furosemide (LASIX) injection 40 mg (40 mg Intravenous Given 06/02/23 0321)    Mobility walks

## 2023-06-02 NOTE — Progress Notes (Signed)
Echocardiogram 2D Echocardiogram has been performed.  Robert Church 06/02/2023, 3:53 PM

## 2023-06-02 NOTE — Progress Notes (Signed)
  PROGRESS NOTE  Patient admitted earlier this morning. See H&P.   Robert Church is a 67 y.o. male with medical history significant for HFrEF less than 20%, type 2 diabetes, hypertension, GERD, obesity, chronic anxiety, OSA on BiPAP, current tobacco user, who presented to [facility] ED from home due to progressive shortness of breath and worsening bilateral lower extremity edema x 3 days despite compliance with home diuretics.  Associated with a productive cough with clear mucus.  Endorses pleuritic chest pain worse with coughing.   In the ED, mild wheezing noted on exam as well as bilateral lower extremity pitting edema.  High-sensitivity troponin peaked at 79 and down trended.  BNP elevated greater than 450, chest x-ray revealed cardiomegaly with mild interstitial edema.  Patient was admitted for CHF exacerbation, started on IV Lasix.  Patient seen in the emergency department.  He admits to lower extremity edema, left greater than right.  Has been compliant with home diuretics, although notes he has been peeing less than usual.  Over the past couple of months, he was told that he had cellulitis of his lower extremity, has been on antibiotics as outpatient.  Acute on chronic systolic heart failure -BNP 454.8 -Consult cardiology today -IV Lasix, losartan -Strict I's and O's, daily weight  Demand ischemia -Insetting of CHF -Troponin 79, 71, 93  Diabetes mellitus -A1c 6 -Sliding scale insulin  Hypokalemia -Replace  COPD -Uses oxygen at home on as needed basis as well as nightly  OSA -BiPAP nightly  Tobacco abuse -Nicotine patch, cessation counseling  Obesity -BMI 33   Status is: Inpatient Remains inpatient appropriate because: IV Lasix   Noralee Stain, DO [facility] 06/02/2023, 1:22 PM  Available via Epic secure chat 7am-7pm After these hours, please refer to coverage provider listed on [email or web address]

## 2023-06-03 DIAGNOSIS — I509 Heart failure, unspecified: Secondary | ICD-10-CM

## 2023-06-03 DIAGNOSIS — I5021 Acute systolic (congestive) heart failure: Secondary | ICD-10-CM | POA: Diagnosis not present

## 2023-06-03 DIAGNOSIS — I2489 Other forms of acute ischemic heart disease: Secondary | ICD-10-CM | POA: Insufficient documentation

## 2023-06-03 DIAGNOSIS — E669 Obesity, unspecified: Secondary | ICD-10-CM | POA: Insufficient documentation

## 2023-06-03 DIAGNOSIS — I5023 Acute on chronic systolic (congestive) heart failure: Secondary | ICD-10-CM | POA: Diagnosis not present

## 2023-06-03 DIAGNOSIS — E876 Hypokalemia: Secondary | ICD-10-CM | POA: Insufficient documentation

## 2023-06-03 LAB — BASIC METABOLIC PANEL
Anion gap: 12 (ref 5–15)
BUN: 23 mg/dL (ref 8–23)
CO2: 29 mmol/L (ref 22–32)
Calcium: 8.6 mg/dL — ABNORMAL LOW (ref 8.9–10.3)
Chloride: 96 mmol/L — ABNORMAL LOW (ref 98–111)
Creatinine, Ser: 0.86 mg/dL (ref 0.61–1.24)
GFR, Estimated: >60 mL/min (ref >60)
Glucose, Bld: 117 mg/dL — ABNORMAL HIGH (ref 70–99)
Potassium: 3.4 mmol/L — ABNORMAL LOW (ref 3.5–5.1)
Sodium: 137 mmol/L (ref 135–145)

## 2023-06-03 LAB — GLUCOSE, CAPILLARY
Glucose-Capillary: 116 mg/dL — ABNORMAL HIGH (ref 70–99)
Glucose-Capillary: 120 mg/dL — ABNORMAL HIGH (ref 70–99)
Glucose-Capillary: 148 mg/dL — ABNORMAL HIGH (ref 70–99)

## 2023-06-03 LAB — MAGNESIUM: Magnesium: 1.9 mg/dL (ref 1.7–2.4)

## 2023-06-03 MED ORDER — PREDNISONE 20 MG PO TABS: 40.0000 mg | ORAL_TABLET | Freq: Every day | ORAL | 0 refills | Status: AC

## 2023-06-03 MED ORDER — NICOTINE 14 MG/24HR TD PT24
14.0000 mg | MEDICATED_PATCH | Freq: Every day | TRANSDERMAL | Status: DC
  Administered 2023-06-03: 14 mg via TRANSDERMAL
  Filled 2023-06-03: qty 1

## 2023-06-03 MED ORDER — POTASSIUM CHLORIDE CRYS ER 20 MEQ PO TBCR
40.0000 meq | EXTENDED_RELEASE_TABLET | Freq: Once | ORAL | Status: AC
  Administered 2023-06-03: 40 meq via ORAL
  Filled 2023-06-03: qty 2

## 2023-06-03 MED ORDER — BENZONATATE 100 MG PO CAPS
100.0000 mg | ORAL_CAPSULE | Freq: Two times a day (BID) | ORAL | Status: DC | PRN
  Administered 2023-06-03: 100 mg via ORAL
  Filled 2023-06-03: qty 1

## 2023-06-03 MED ORDER — TORSEMIDE 20 MG PO TABS
40.0000 mg | ORAL_TABLET | Freq: Two times a day (BID) | ORAL | 1 refills | Status: DC
Start: 2023-06-03 — End: 2024-01-20

## 2023-06-03 MED ORDER — POTASSIUM CHLORIDE CRYS ER 20 MEQ PO TBCR
40.0000 meq | EXTENDED_RELEASE_TABLET | Freq: Every day | ORAL | Status: DC
  Administered 2023-06-03: 40 meq via ORAL
  Filled 2023-06-03 (×2): qty 2

## 2023-06-03 MED ORDER — METOLAZONE 2.5 MG PO TABS
2.5000 mg | ORAL_TABLET | Freq: Once | ORAL | Status: AC
  Administered 2023-06-03: 2.5 mg via ORAL
  Filled 2023-06-03: qty 1

## 2023-06-03 MED ORDER — GUAIFENESIN-DM 100-10 MG/5ML PO SYRP
5.0000 mL | ORAL_SOLUTION | ORAL | Status: DC | PRN
  Filled 2023-06-03: qty 10

## 2023-06-03 MED ORDER — FUROSEMIDE 10 MG/ML IJ SOLN
6.0000 mg/h | INTRAVENOUS | Status: DC
  Filled 2023-06-03: qty 20

## 2023-06-03 NOTE — Progress Notes (Signed)
Mobility Specialist - Progress Note   06/03/23 1418  Mobility  Activity Ambulated with assistance in room  Level of Assistance Standby assist, set-up cues, supervision of patient - no hands on  Assistive Device Front wheel walker  Distance Ambulated (ft) 20 ft  Range of Motion/Exercises Active  Activity Response Tolerated well  $Mobility charge 1 Mobility  Mobility Specialist Start Time (ACUTE ONLY) 1410  Mobility Specialist Stop Time (ACUTE ONLY) 1418  Mobility Specialist Time Calculation (min) (ACUTE ONLY) 8 min   Pt was found on recliner chair and agreeable to try ambulation. Stated only being able to do short distance due to a previous back accident. At EOS returned to recliner chair with all needs met. Call bell in reach.  Billey Chang Mobility Specialist

## 2023-06-03 NOTE — Progress Notes (Signed)
Heart Failure Navigator Progress Note  Assessed for Heart & Vascular TOC clinic readiness.  Patient does not meet criteria due to Advanced Heart Failure Team patient of Dr. Bensimhon.   Navigator will sign off at this time.   Rashidah [place], BSN, RN Heart Failure Nurse Navigator Secure Chat Only   

## 2023-06-03 NOTE — Care Management CC44 (Signed)
Condition Code 44 Documentation Completed  Patient Details  Name: Robert Church MRN: [id number] Date of Birth: 1956/10/30   Condition Code 44 given:  Yes Patient signature on Condition Code 44 notice:  Yes Documentation of 2 MD's agreement:  Yes Code 44 added to claim:  Yes    Larrie Kass, LCSW 06/03/2023, 3:03 PM

## 2023-06-03 NOTE — Progress Notes (Addendum)
Rounding Note    Patient Name: Robert Church Date of Encounter: 06/03/2023  [facility] Cardiologist: Nanetta Batty, MD   Subjective   Has been having yellow thick productive cough with sinus issue. IV came off last night and was replaced.   Inpatient Medications    Scheduled Meds:  allopurinol  300 mg Oral Daily   cyanocobalamin  1,000 mcg Oral Daily   enoxaparin (LOVENOX) injection  40 mg Subcutaneous Q24H   insulin aspart  0-9 Units Subcutaneous TID WC   ipratropium-albuterol  3 mL Nebulization Q6H   losartan  25 mg Oral Daily   mometasone-formoterol  2 puff Inhalation BID   nicotine  14 mg Transdermal Daily   pantoprazole  20 mg Oral BID AC   roflumilast  500 mcg Oral Daily   Continuous Infusions:  furosemide (LASIX) 200 mg in dextrose 5 % 100 mL (2 mg/mL) infusion 6 mg/hr (06/03/23 0848)   PRN Meds: albuterol, ALPRAZolam, benzonatate   Vital Signs    Vitals:   06/03/23 0015 06/03/23 0500 06/03/23 0646 06/03/23 0909  BP: 122/85  118/88   Pulse: (!) 104  88   Resp: 20  18   Temp: 97.8 F (36.6 C)  97.7 F (36.5 C)   TempSrc: Oral  Oral   SpO2: 92%  92% 95%  Weight:  105.2 kg    Height:        Intake/Output Summary (Last 24 hours) at 06/03/2023 1113 Last data filed at 06/03/2023 0900 Gross per 24 hour  Intake 1476.13 ml  Output 2625 ml  Net -1148.87 ml      06/03/2023    5:00 AM 06/02/2023    1:01 AM 05/20/2023    3:37 PM  Last 3 Weights  Weight (lbs) 232 lb 235 lb 238 lb  Weight (kg) 105.235 kg 106.595 kg 107.956 kg      Telemetry    Sinus rhythm, paced? - Personally Reviewed  ECG    Sinus rhythm, bundle branch block - Personally Reviewed  Physical Exam   GEN: No acute distress.   Neck: No JVD Cardiac: RRR, no murmurs, rubs, or gallops.  Respiratory: bilateral wheezing and rhonchi.  GI: Soft, nontender, non-distended  MS: 2+ edema; No deformity. Neuro:  Nonfocal  Psych: Normal affect   Labs    High Sensitivity  Troponin:   Recent Labs  Lab 06/02/23 0108 06/02/23 0319 06/02/23 0558  TROPONINIHS 79* 71* 93*     Chemistry Recent Labs  Lab 06/02/23 0108 06/03/23 0442  NA 136 137  K 3.4* 3.4*  CL 97* 96*  CO2 29 29  GLUCOSE 118* 117*  BUN 17 23  CREATININE 0.88 0.86  CALCIUM 9.1 8.6*  MG  --  1.9  GFRNONAA >60 >60  ANIONGAP 10 12    Lipids No results for input(s): "CHOL", "TRIG", "HDL", "LABVLDL", "LDLCALC", "CHOLHDL" in the last 168 hours.  Hematology Recent Labs  Lab 06/02/23 0108  WBC 9.3  RBC 5.01  HGB 16.4  HCT 49.1  MCV 98.0  MCH 32.7  MCHC 33.4  RDW 13.2  PLT 178   Thyroid No results for input(s): "TSH", "FREET4" in the last 168 hours.  BNP Recent Labs  Lab 06/02/23 0108  BNP 454.8*    DDimer No results for input(s): "DDIMER" in the last 168 hours.   Radiology    ECHOCARDIOGRAM LIMITED  Result Date: 06/02/2023    ECHOCARDIOGRAM LIMITED REPORT   Patient Name:   Robert Church Date  of Exam: 06/02/2023 Medical Rec #:  [id number]     Height:       70.0 in Accession #:    [id number]    Weight:       235.0 lb Date of Birth:  09-10-1956      BSA:          2.235 m Patient Age:    66 years      BP:           139/96 mmHg Patient Gender: M             HR:           140 bpm. Exam Location:  Inpatient Procedure: Limited Echo and Limited Color Doppler Indications:    CHF-Acute Systolic I50.21  History:        Patient has prior history of Echocardiogram examinations, most                 recent 01/13/2023. CHF and Cardiomyopathy, COPD; Risk                 Factors:Hypertension, Sleep Apnea, Diabetes, Current Smoker and                 Dyslipidemia.  Sonographer:    Lucendia Herrlich Referring Phys: [id number] CAROLE N HALL IMPRESSIONS  1. Left ventricular ejection fraction, by estimation, is 35 to 40%. The left ventricle has moderately decreased function. The left ventricle demonstrates regional wall motion abnormalities (see scoring diagram/findings for description). The left ventricular  internal cavity size was severely dilated. Left ventricular diastolic parameters are indeterminate.  2. Right ventricular systolic function is low normal. The right ventricular size is normal.  3. The mitral valve is normal in structure. No evidence of mitral valve regurgitation. No evidence of mitral stenosis.  4. The aortic valve is normal in structure. Aortic valve regurgitation is trivial.  5. The inferior vena cava is dilated in size with >50% respiratory variability, suggesting right atrial pressure of 8 mmHg. Comparison(s): Prior images reviewed side by side. Septal function has improved from prior, accounting for increase in LVEF. FINDINGS  Left Ventricle: Left ventricular ejection fraction, by estimation, is 35 to 40%. The left ventricle has moderately decreased function. The left ventricle demonstrates regional wall motion abnormalities. The left ventricular internal cavity size was severely dilated. There is no left ventricular hypertrophy. Left ventricular diastolic parameters are indeterminate.  LV Wall Scoring: The entire lateral wall and entire inferior wall are hypokinetic. Right Ventricle: The right ventricular size is normal. Right ventricular systolic function is low normal. Mitral Valve: The mitral valve is normal in structure. No evidence of mitral valve stenosis. Aortic Valve: The aortic valve is normal in structure. Aortic valve regurgitation is trivial. Aorta: The aortic root and ascending aorta are structurally normal, with no evidence of dilitation. Venous: The inferior vena cava is dilated in size with greater than 50% respiratory variability, suggesting right atrial pressure of 8 mmHg. LEFT VENTRICLE PLAX 2D LVIDd:         7.20 cm LVIDs:         5.70 cm LV PW:         1.20 cm LV IVS:        0.80 cm LVOT diam:     2.30 cm LVOT Area:     4.15 cm  LV Volumes (MOD) LV vol d, MOD A2C: 193.0 ml LV vol d, MOD A4C: 197.0 ml LV vol s, MOD A2C: 133.0 ml LV vol s,  MOD A4C: 107.0 ml LV SV MOD A2C:      60.0 ml LV SV MOD A4C:     197.0 ml LV SV MOD BP:      71.2 ml IVC IVC diam: 2.40 cm LEFT ATRIUM         Index LA diam:    4.40 cm 1.97 cm/m   AORTA Ao Root diam: 3.80 cm Ao Asc diam:  3.20 cm  SHUNTS Systemic Diam: 2.30 cm Riley Lam MD Electronically signed by Riley Lam MD Signature Date/Time: 06/02/2023/5:49:07 PM    Final    DG Chest Portable 1 View  Result Date: 06/02/2023 CLINICAL DATA:  Chest congestion and shortness of breath. EXAM: PORTABLE CHEST 1 VIEW COMPARISON:  November 11, 2022 FINDINGS: There is stable dual lead AICD positioning. An additional stable right chest a vagal stimulator is seen. The cardiac silhouette is mildly enlarged and unchanged in size. Mild, diffusely increased interstitial lung markings are noted with mild atelectasis seen within the mid left lung and bilateral lung bases. No pleural effusion or pneumothorax is identified. The visualized skeletal structures are unremarkable. IMPRESSION: Stable cardiomegaly with mild interstitial edema with mild bilateral mid left lung and bibasilar atelectasis. Electronically Signed   By: Aram Candela M.D.   On: 06/02/2023 01:30    Cardiac Studies   Limited echo 06/02/2023  1. Left ventricular ejection fraction, by estimation, is 35 to 40%. The  left ventricle has moderately decreased function. The left ventricle  demonstrates regional wall motion abnormalities (see scoring  diagram/findings for description). The left  ventricular internal cavity size was severely dilated. Left ventricular  diastolic parameters are indeterminate.   2. Right ventricular systolic function is low normal. The right  ventricular size is normal.   3. The mitral valve is normal in structure. No evidence of mitral valve  regurgitation. No evidence of mitral stenosis.   4. The aortic valve is normal in structure. Aortic valve regurgitation is  trivial.   5. The inferior vena cava is dilated in size with >50% respiratory   variability, suggesting right atrial pressure of 8 mmHg.   Comparison(s): Prior images reviewed side by side. Septal function has  improved from prior, accounting for increase in LVEF.   Patient Profile     67 y.o. male with PMH of HFrEF/ NICM, type 2 DM, HTN, GERD, obesity, chronic anxiety, OSA on CPAP, tobacco use, symptomatic bradycardia s/p CRT-P, carotid artery disease who presented with acute CHF  Assessment & Plan    Acute on chronic systolic heart failure / NICM  - cath in 08/2021 showed normal coronary arteries. CRT-P placed in Nov 2023. Last echo 01/13/2023 showed EF <20%  - limited echo 06/02/2023 showed EF 35-40%, trivial AI.   - multiple medication intolerances- entresto (dizziness, low BP), spironolactone (history of anaphylaxis), jardiance (positional dizziness, nausea), bisoprolol (fatigue), carvedilol (dizziness), toprol (dry mouth)   - 3 days of worsening dyspnea and months of ankle edema. Placed on lasix gtt yesterday. On exam, still has 2+ pitting edema. Eager to go home to take care of pet today. I am not confident his breathing is due to volume overload given thick yellow productive cough, wheezing and rhonchi, however he does appears to have volume overload with at least 2+ pitting edema.  - Would ideally continue IV lasix 6 mg/hr here, but patient wish to leave today. Consider give him a dose of metolazone 2.5 mg today, continue metolazone PRN at home and increase home torsemide to 40mg  BID.  Hypokalemia: change KCl to 40 meq daily, with additional 40 meq on day he take metolazone.   HTN: losartan  DM II  H/o CRT-P: St Jude PPM initially placed on 03/2021, upgraded to BiV PPM 10/2022      For questions or updates, please contact [facility] Please consult [email or web address] for contact info under        Signed, Azalee Course, PA  06/03/2023, 11:13 AM    History and all data above reviewed.  Patient examined.  I agree with the findings as above.  He wants to  go home.  Still SOB and coughing.  Slept in the chair with his feet on the ground.    The patient exam reveals COR:RRR  ,  Lungs: Decreased breath sounds with diffuse coarse crackles  ,  Abd: Positive bowel sounds, no rebound no guarding, Ext Severe leg swelling  .  All available labs, radiology testing, previous records reviewed. Agree with documented assessment and plan.   Acute on chronic systolic HF:  Unable to titrate meds.  I agree with metolazone today and take another dose this weekend.  I will give an extra dose of Kdur today (total 120 meq) and he should take his usual 20 meq daily and an additional 40 meq when he takes the metolazone. We will arrange TOC follow up in the HF clinic.    Fayrene Fearing Takeisha Cianci  11:47 AM  06/03/2023

## 2023-06-03 NOTE — Discharge Summary (Signed)
Physician Discharge Summary  Robert Church ZOX:[id number] DOB: [birth year] DOA: 06/02/2023  PCP: [name], MD  Admit date: 06/02/2023 Discharge date: 06/03/2023  Admitted From: Home Disposition:  Home  Recommendations for Outpatient Follow-up:  Follow up with heart failure clinic  Discharge Condition: Stable CODE STATUS: Full code Diet recommendation: Heart healthy diet  Brief/Interim Summary: JAEVYN TABACCO is a 67 y.o. male with medical history significant for HFrEF less than 20%, type 2 diabetes, hypertension, GERD, obesity, chronic anxiety, OSA on BiPAP, current tobacco user, who presented to [facility] ED from home due to progressive shortness of breath and worsening bilateral lower extremity edema x 3 days despite compliance with home diuretics.  Associated with a productive cough with clear mucus.  Endorses pleuritic chest pain worse with coughing.   In the ED, mild wheezing noted on exam as well as bilateral lower extremity pitting edema.  High-sensitivity troponin peaked at 79 and down trended.  BNP elevated greater than 450, chest x-ray revealed cardiomegaly with mild interstitial edema.  Patient was admitted for CHF exacerbation, started on IV Lasix.  Cardiology was consulted, started on IV Lasix drip.  He had good urine output, 2625 mL recorded in 24 hours.  Patient was recommended to stay in hospital for further medication adjustment, but he wished to go home due to needing to feed his animals.  Medications were further adjusted by cardiology team.  He will have close outpatient cardiology follow-up.  Due to his complaints of wheezing and productive cough, he was also sent home with prednisone burst.  Discharge Diagnoses:   Principal Problem:   Acute on chronic systolic CHF (congestive heart failure) (HCC) Active Problems:   HTN (hypertension)   GERD (gastroesophageal reflux disease)   Obstructive sleep apnea syndrome   COPD (chronic obstructive pulmonary disease) (HCC)    Cigarette smoker   Type II diabetes mellitus with manifestations (HCC)   Chronic respiratory failure with hypoxia (HCC)   Demand ischemia   Hypokalemia   Obesity (BMI 30-39.9)    Discharge Instructions  Discharge Instructions     (HEART FAILURE PATIENTS) Call MD:  Anytime you have any of the following symptoms: 1) 3 pound weight gain in 24 hours or 5 pounds in 1 week 2) shortness of breath, with or without a dry hacking cough 3) swelling in the hands, feet or stomach 4) if you have to sleep on extra pillows at night in order to breathe.   Complete by: As directed    Call MD for:  difficulty breathing, headache or visual disturbances   Complete by: As directed    Call MD for:  extreme fatigue   Complete by: As directed    Call MD for:  persistant dizziness or light-headedness   Complete by: As directed    Call MD for:  persistant nausea and vomiting   Complete by: As directed    Call MD for:  severe uncontrolled pain   Complete by: As directed    Call MD for:  temperature >100.4   Complete by: As directed    Diet - low sodium heart healthy   Complete by: As directed    Discharge instructions   Complete by: As directed    You were cared for by a hospitalist during your hospital stay. If you have any questions about your discharge medications or the care you received while you were in the hospital after you are discharged, you can call the unit and ask to speak with the hospitalist  on call if the hospitalist that took care of you is not available. Once you are discharged, your primary care physician will handle any further medical issues. Please note that NO REFILLS for any discharge medications will be authorized once you are discharged, as it is imperative that you return to your primary care physician (or establish a relationship with a primary care physician if you do not have one) for your aftercare needs so that they can reassess your need for medications and monitor your lab  values.   Increase activity slowly   Complete by: As directed       Allergies as of 06/03/2023       Reactions   Bee Venom Shortness Of Breath, Swelling   Cefdinir Other (See Comments)   Patient states he couldn't really breath    Spironolactone Anaphylaxis   Daliresp [roflumilast] Other (See Comments)   Dizzy, headache, leg pain per patient. 02/25/22. Tolerates in low doses    Jardiance [empagliflozin] Nausea Only, Other (See Comments)   Lightheadness Dizziness   Penicillins Swelling, Other (See Comments)   Childhood rxn--MD stated he "almost died" Has patient had a PCN reaction causing immediate rash, facial/tongue/throat swelling, SOB or lightheadedness with hypotension:Yes Has patient had a PCN reaction causing severe rash involving mucus membranes or skin necrosis:unsure Has patient had a PCN reaction that required hospitalization:unsure Has patient had a PCN reaction occurring within the last 10 years:No If all of the above answers are "NO", then may proceed with Cephalosporin use.   Clindamycin Other (See Comments)   Tongue swelling   Doxycycline Nausea And Vomiting   Severe stomach upset per patient   E-mycin [erythromycin Base] Swelling   Rosuvastatin Other (See Comments)   Myalgias (intolerance)        Medication List     STOP taking these medications    icosapent Ethyl 1 g capsule Commonly known as: Vascepa   Rybelsus 3 MG Tabs Generic drug: Semaglutide       TAKE these medications    albuterol (2.5 MG/3ML) 0.083% nebulizer solution Commonly known as: PROVENTIL Take 3 mLs (2.5 mg total) by nebulization every 4 (four) hours as needed for wheezing or shortness of breath.   albuterol 108 (90 Base) MCG/ACT inhaler Commonly known as: Ventolin HFA INHALE 2 PUFFS INTO THE LUNGS EVERY 6 HOURS AS NEEDED FOR WHEEZING   allopurinol 300 MG tablet Commonly known as: ZYLOPRIM Take 1 tablet (300 mg total) by mouth daily.   ALPRAZolam 0.5 MG tablet Commonly  known as: XANAX Take 1 tablet (0.5 mg total) by mouth 3 (three) times daily. What changed:  when to take this reasons to take this   AMBULATORY NON FORMULARY MEDICATION Medication Name: Nitroglycerin gel 0.125% apply 2-3 times daily to the anal canal. What changed:  how much to take how to take this when to take this reasons to take this   cyanocobalamin 1000 MCG tablet Commonly known as: VITAMIN B12 Take 1,000 mcg by mouth daily.   Daliresp 500 MCG Tabs tablet Generic drug: roflumilast TAKE 1 TABLET(500 MCG) BY MOUTH DAILY   diclofenac Sodium 1 % Gel Commonly known as: VOLTAREN Apply 1 Application topically in the morning and at bedtime.   EPINEPHrine 0.3 mg/0.3 mL Soaj injection Commonly known as: EPI-PEN Inject 0.3 mg into the muscle as needed for anaphylaxis.   fluticasone 50 MCG/ACT nasal spray Commonly known as: FLONASE SHAKE LIQUID AND USE 2 SPRAYS IN EACH NOSTRIL DAILY What changed: See the new instructions.  guaiFENesin 600 MG 12 hr tablet Commonly known as: MUCINEX Take 1 tablet (600 mg total) by mouth 2 (two) times daily. What changed:  when to take this reasons to take this   ipratropium 0.06 % nasal spray Commonly known as: ATROVENT Place 2 sprays into both nostrils 4 (four) times daily as needed for rhinitis.   Lidocaine (Anorectal) 5 % Crea Apply 1 application topically 2 (two) times daily. What changed:  when to take this reasons to take this   lidocaine 5 % ointment Commonly known as: XYLOCAINE Apply topically 3 (three) times daily as needed. What changed:  how much to take when to take this reasons to take this   loratadine 10 MG tablet Commonly known as: CLARITIN Take 10 mg by mouth daily.   losartan 25 MG tablet Commonly known as: COZAAR Take 1 tablet (25 mg total) by mouth daily.   metolazone 2.5 MG tablet Commonly known as: ZAROXOLYN Take 1 tablet (2.5 mg total) by mouth as directed. What changed:  when to take  this reasons to take this   mometasone-formoterol 100-5 MCG/ACT Aero Commonly known as: DULERA Inhale 2 puffs into the lungs 2 (two) times daily.   montelukast 5 MG chewable tablet Commonly known as: Singulair Chew 2 tablets (10 mg total) by mouth at bedtime.   nicotine 14 mg/24hr patch Commonly known as: NICODERM CQ - dosed in mg/24 hours Place 1 patch (14 mg total) onto the skin daily.   nystatin 100000 UNIT/ML suspension Commonly known as: MYCOSTATIN Take 5 mLs (500,000 Units total) by mouth in the morning, at noon, and at bedtime.   omeprazole 20 MG capsule Commonly known as: PRILOSEC TAKE 1 CAPSULE(20 MG) BY MOUTH TWICE DAILY BEFORE A MEAL FOR ACID REFLUX   potassium chloride SA 20 MEQ tablet Commonly known as: Klor-Con M20 Take 1 tablet (20 mEq total) by mouth daily. Take an extra 40 meq when you take metolazone   pravastatin 10 MG tablet Commonly known as: PRAVACHOL Take 1 tablet (10 mg total) by mouth daily.   predniSONE 20 MG tablet Commonly known as: DELTASONE Take 2 tablets (40 mg total) by mouth daily with breakfast for 5 days. What changed:  medication strength how much to take how to take this when to take this additional instructions Another medication with the same name was removed. Continue taking this medication, and follow the directions you see here.   Spiriva Respimat 2.5 MCG/ACT Aers Generic drug: Tiotropium Bromide Monohydrate Inhale 2 puffs into the lungs daily.   Testosterone 20.25 MG/1.25GM (1.62%) Gel Place 1.25 g onto the skin daily. What changed:  how much to take when to take this   torsemide 20 MG tablet Commonly known as: DEMADEX Take 2 tablets (40 mg total) by mouth 2 (two) times daily. What changed: how much to take        Allergies  Allergen Reactions   Bee Venom Shortness Of Breath and Swelling   Cefdinir Other (See Comments)    Patient states he couldn't really breath    Spironolactone Anaphylaxis   Daliresp  [Roflumilast] Other (See Comments)    Dizzy, headache, leg pain per patient. 02/25/22. Tolerates in low doses    Jardiance [Empagliflozin] Nausea Only and Other (See Comments)    Lightheadness Dizziness   Penicillins Swelling and Other (See Comments)    Childhood rxn--MD stated he "almost died" Has patient had a PCN reaction causing immediate rash, facial/tongue/throat swelling, SOB or lightheadedness with hypotension:Yes Has patient had a  PCN reaction causing severe rash involving mucus membranes or skin necrosis:unsure Has patient had a PCN reaction that required hospitalization:unsure Has patient had a PCN reaction occurring within the last 10 years:No If all of the above answers are "NO", then may proceed with Cephalosporin use.     Clindamycin Other (See Comments)    Tongue swelling   Doxycycline Nausea And Vomiting    Severe stomach upset per patient   E-Mycin [Erythromycin Base] Swelling   Rosuvastatin Other (See Comments)    Myalgias (intolerance)    Consultations: Cardiology    Procedures/Studies: ECHOCARDIOGRAM LIMITED  Result Date: 06/02/2023    ECHOCARDIOGRAM LIMITED REPORT   Patient Name:   ZAVIOR THOMASON Date of Exam: 06/02/2023 Medical Rec #:  [id number]     Height:       70.0 in Accession #:    [id number]    Weight:       235.0 lb Date of Birth:  January 07, 1956      BSA:          2.235 m Patient Age:    66 years      BP:           139/96 mmHg Patient Gender: M             HR:           140 bpm. Exam Location:  Inpatient Procedure: Limited Echo and Limited Color Doppler Indications:    CHF-Acute Systolic I50.21  History:        Patient has prior history of Echocardiogram examinations, most                 recent 01/13/2023. CHF and Cardiomyopathy, COPD; Risk                 Factors:Hypertension, Sleep Apnea, Diabetes, Current Smoker and                 Dyslipidemia.  Sonographer:    Lucendia Herrlich Referring Phys: [id number] CAROLE N HALL IMPRESSIONS  1. Left ventricular ejection  fraction, by estimation, is 35 to 40%. The left ventricle has moderately decreased function. The left ventricle demonstrates regional wall motion abnormalities (see scoring diagram/findings for description). The left ventricular internal cavity size was severely dilated. Left ventricular diastolic parameters are indeterminate.  2. Right ventricular systolic function is low normal. The right ventricular size is normal.  3. The mitral valve is normal in structure. No evidence of mitral valve regurgitation. No evidence of mitral stenosis.  4. The aortic valve is normal in structure. Aortic valve regurgitation is trivial.  5. The inferior vena cava is dilated in size with >50% respiratory variability, suggesting right atrial pressure of 8 mmHg. Comparison(s): Prior images reviewed side by side. Septal function has improved from prior, accounting for increase in LVEF. FINDINGS  Left Ventricle: Left ventricular ejection fraction, by estimation, is 35 to 40%. The left ventricle has moderately decreased function. The left ventricle demonstrates regional wall motion abnormalities. The left ventricular internal cavity size was severely dilated. There is no left ventricular hypertrophy. Left ventricular diastolic parameters are indeterminate.  LV Wall Scoring: The entire lateral wall and entire inferior wall are hypokinetic. Right Ventricle: The right ventricular size is normal. Right ventricular systolic function is low normal. Mitral Valve: The mitral valve is normal in structure. No evidence of mitral valve stenosis. Aortic Valve: The aortic valve is normal in structure. Aortic valve regurgitation is trivial. Aorta: The aortic root and ascending aorta are structurally  normal, with no evidence of dilitation. Venous: The inferior vena cava is dilated in size with greater than 50% respiratory variability, suggesting right atrial pressure of 8 mmHg. LEFT VENTRICLE PLAX 2D LVIDd:         7.20 cm LVIDs:         5.70 cm LV PW:          1.20 cm LV IVS:        0.80 cm LVOT diam:     2.30 cm LVOT Area:     4.15 cm  LV Volumes (MOD) LV vol d, MOD A2C: 193.0 ml LV vol d, MOD A4C: 197.0 ml LV vol s, MOD A2C: 133.0 ml LV vol s, MOD A4C: 107.0 ml LV SV MOD A2C:     60.0 ml LV SV MOD A4C:     197.0 ml LV SV MOD BP:      71.2 ml IVC IVC diam: 2.40 cm LEFT ATRIUM         Index LA diam:    4.40 cm 1.97 cm/m   AORTA Ao Root diam: 3.80 cm Ao Asc diam:  3.20 cm  SHUNTS Systemic Diam: 2.30 cm Riley Lam MD Electronically signed by Riley Lam MD Signature Date/Time: 06/02/2023/5:49:07 PM    Final    DG Chest Portable 1 View  Result Date: 06/02/2023 CLINICAL DATA:  Chest congestion and shortness of breath. EXAM: PORTABLE CHEST 1 VIEW COMPARISON:  November 11, 2022 FINDINGS: There is stable dual lead AICD positioning. An additional stable right chest a vagal stimulator is seen. The cardiac silhouette is mildly enlarged and unchanged in size. Mild, diffusely increased interstitial lung markings are noted with mild atelectasis seen within the mid left lung and bilateral lung bases. No pleural effusion or pneumothorax is identified. The visualized skeletal structures are unremarkable. IMPRESSION: Stable cardiomegaly with mild interstitial edema with mild bilateral mid left lung and bibasilar atelectasis. Electronically Signed   By: Aram Candela M.D.   On: 06/02/2023 01:30   CUP PACEART REMOTE DEVICE CHECK  Result Date: 05/19/2023 Scheduled remote reviewed. Normal device function.  1 AHR x 4 sec. Next remote 91 days. [place], CVRS      Discharge Exam: Vitals:   06/03/23 1320 06/03/23 1335  BP:  116/80  Pulse:  99  Resp:    Temp:  98 F (36.7 C)  SpO2: 96% 92%    General: Pt is alert, awake, not in acute distress Cardiovascular: RRR, S1/S2 +, +bilateral edema Respiratory: CTA bilaterally, no respiratory distress, no conversational dyspnea  Abdominal: Soft, NT, ND, bowel sounds + Extremities: + edema, no cyanosis Psych:  Normal mood and affect, stable judgement and insight     The results of significant diagnostics from this hospitalization (including imaging, microbiology, ancillary and laboratory) are listed below for reference.     Microbiology: Recent Results (from the past 240 hour(s))  Resp panel by RT-PCR (RSV, Flu A&B, Covid) Anterior Nasal Swab     Status: None   Collection Time: 06/02/23  1:08 AM   Specimen: Anterior Nasal Swab  Result Value Ref Range Status   SARS Coronavirus 2 by RT PCR NEGATIVE NEGATIVE Final    Comment: (NOTE) SARS-CoV-2 target nucleic acids are NOT DETECTED.  The SARS-CoV-2 RNA is generally detectable in upper respiratory specimens during the acute phase of infection. The lowest concentration of SARS-CoV-2 viral copies this assay can detect is 138 copies/mL. A negative result does not preclude SARS-Cov-2 infection and should not be used as the  sole basis for treatment or other patient management decisions. A negative result may occur with  improper specimen collection/handling, submission of specimen other than nasopharyngeal swab, presence of viral mutation(s) within the areas targeted by this assay, and inadequate number of viral copies(<138 copies/mL). A negative result must be combined with clinical observations, patient history, and epidemiological information. The expected result is Negative.  Fact Sheet for Patients:  BloggerCourse.com  Fact Sheet for Healthcare Providers:  SeriousBroker.it  This test is no t yet approved or cleared by the [place] FDA and  has been authorized for detection and/or diagnosis of SARS-CoV-2 by FDA under an Emergency Use Authorization (EUA). This EUA will remain  in effect (meaning this test can be used) for the duration of the COVID-19 declaration under Section 564(b)(1) of the Act, 21 U.S.C.section 360bbb-3(b)(1), unless the authorization is terminated  or revoked  sooner.       Influenza A by PCR NEGATIVE NEGATIVE Final   Influenza B by PCR NEGATIVE NEGATIVE Final    Comment: (NOTE) The Xpert Xpress SARS-CoV-2/FLU/RSV plus assay is intended as an aid in the diagnosis of influenza from Nasopharyngeal swab specimens and should not be used as a sole basis for treatment. Nasal washings and aspirates are unacceptable for Xpert Xpress SARS-CoV-2/FLU/RSV testing.  Fact Sheet for Patients: BloggerCourse.com  Fact Sheet for Healthcare Providers: SeriousBroker.it  This test is not yet approved or cleared by the [place] FDA and has been authorized for detection and/or diagnosis of SARS-CoV-2 by FDA under an Emergency Use Authorization (EUA). This EUA will remain in effect (meaning this test can be used) for the duration of the COVID-19 declaration under Section 564(b)(1) of the Act, 21 U.S.C. section 360bbb-3(b)(1), unless the authorization is terminated or revoked.     Resp Syncytial Virus by PCR NEGATIVE NEGATIVE Final    Comment: (NOTE) Fact Sheet for Patients: BloggerCourse.com  Fact Sheet for Healthcare Providers: SeriousBroker.it  This test is not yet approved or cleared by the [place] FDA and has been authorized for detection and/or diagnosis of SARS-CoV-2 by FDA under an Emergency Use Authorization (EUA). This EUA will remain in effect (meaning this test can be used) for the duration of the COVID-19 declaration under Section 564(b)(1) of the Act, 21 U.S.C. section 360bbb-3(b)(1), unless the authorization is terminated or revoked.  Performed at [facility], [address], [place], [place] [address]      Labs: BNP (last 3 results) Recent Labs    01/13/23 1546 01/18/23 1700 06/02/23 0108  BNP 455.7* 161.1* 454.8*   Basic Metabolic Panel: Recent Labs  Lab 06/02/23 0108 06/03/23 0442  NA 136  137  K 3.4* 3.4*  CL 97* 96*  CO2 29 29  GLUCOSE 118* 117*  BUN 17 23  CREATININE 0.88 0.86  CALCIUM 9.1 8.6*  MG  --  1.9   Liver Function Tests: No results for input(s): "AST", "ALT", "ALKPHOS", "BILITOT", "PROT", "ALBUMIN" in the last 168 hours. No results for input(s): "LIPASE", "AMYLASE" in the last 168 hours. No results for input(s): "AMMONIA" in the last 168 hours. CBC: Recent Labs  Lab 06/02/23 0108  WBC 9.3  NEUTROABS 7.8*  HGB 16.4  HCT 49.1  MCV 98.0  PLT 178   Cardiac Enzymes: No results for input(s): "CKTOTAL", "CKMB", "CKMBINDEX", "TROPONINI" in the last 168 hours. BNP: Invalid input(s): "POCBNP" CBG: Recent Labs  Lab 06/02/23 1627 06/03/23 0009 06/03/23 0805 06/03/23 1154  GLUCAP 178* 120* 116* 148*   D-Dimer No  results for input(s): "DDIMER" in the last 72 hours. Hgb A1c No results for input(s): "HGBA1C" in the last 72 hours. Lipid Profile No results for input(s): "CHOL", "HDL", "LDLCALC", "TRIG", "CHOLHDL", "LDLDIRECT" in the last 72 hours. Thyroid function studies No results for input(s): "TSH", "T4TOTAL", "T3FREE", "THYROIDAB" in the last 72 hours.  Invalid input(s): "FREET3" Anemia work up No results for input(s): "VITAMINB12", "FOLATE", "FERRITIN", "TIBC", "IRON", "RETICCTPCT" in the last 72 hours. Urinalysis    Component Value Date/Time   COLORURINE YELLOW 09/15/2022 1330   APPEARANCEUR CLEAR 09/15/2022 1330   LABSPEC 1.010 09/15/2022 1330   PHURINE 6.0 09/15/2022 1330   GLUCOSEU NEGATIVE 09/15/2022 1330   HGBUR NEGATIVE 09/15/2022 1330   BILIRUBINUR NEGATIVE 09/15/2022 1330   KETONESUR NEGATIVE 09/15/2022 1330   UROBILINOGEN 0.2 09/15/2022 1330   NITRITE NEGATIVE 09/15/2022 1330   LEUKOCYTESUR NEGATIVE 09/15/2022 1330   Sepsis Labs Recent Labs  Lab 06/02/23 0108  WBC 9.3   Microbiology Recent Results (from the past 240 hour(s))  Resp panel by RT-PCR (RSV, Flu A&B, Covid) Anterior Nasal Swab     Status: None   Collection  Time: 06/02/23  1:08 AM   Specimen: Anterior Nasal Swab  Result Value Ref Range Status   SARS Coronavirus 2 by RT PCR NEGATIVE NEGATIVE Final    Comment: (NOTE) SARS-CoV-2 target nucleic acids are NOT DETECTED.  The SARS-CoV-2 RNA is generally detectable in upper respiratory specimens during the acute phase of infection. The lowest concentration of SARS-CoV-2 viral copies this assay can detect is 138 copies/mL. A negative result does not preclude SARS-Cov-2 infection and should not be used as the sole basis for treatment or other patient management decisions. A negative result may occur with  improper specimen collection/handling, submission of specimen other than nasopharyngeal swab, presence of viral mutation(s) within the areas targeted by this assay, and inadequate number of viral copies(<138 copies/mL). A negative result must be combined with clinical observations, patient history, and epidemiological information. The expected result is Negative.  Fact Sheet for Patients:  BloggerCourse.com  Fact Sheet for Healthcare Providers:  SeriousBroker.it  This test is no t yet approved or cleared by the [place] FDA and  has been authorized for detection and/or diagnosis of SARS-CoV-2 by FDA under an Emergency Use Authorization (EUA). This EUA will remain  in effect (meaning this test can be used) for the duration of the COVID-19 declaration under Section 564(b)(1) of the Act, 21 U.S.C.section 360bbb-3(b)(1), unless the authorization is terminated  or revoked sooner.       Influenza A by PCR NEGATIVE NEGATIVE Final   Influenza B by PCR NEGATIVE NEGATIVE Final    Comment: (NOTE) The Xpert Xpress SARS-CoV-2/FLU/RSV plus assay is intended as an aid in the diagnosis of influenza from Nasopharyngeal swab specimens and should not be used as a sole basis for treatment. Nasal washings and aspirates are unacceptable for Xpert Xpress  SARS-CoV-2/FLU/RSV testing.  Fact Sheet for Patients: BloggerCourse.com  Fact Sheet for Healthcare Providers: SeriousBroker.it  This test is not yet approved or cleared by the [place] FDA and has been authorized for detection and/or diagnosis of SARS-CoV-2 by FDA under an Emergency Use Authorization (EUA). This EUA will remain in effect (meaning this test can be used) for the duration of the COVID-19 declaration under Section 564(b)(1) of the Act, 21 U.S.C. section 360bbb-3(b)(1), unless the authorization is terminated or revoked.     Resp Syncytial Virus by PCR NEGATIVE NEGATIVE Final    Comment: (NOTE) Fact Sheet  for Patients: BloggerCourse.com  Fact Sheet for Healthcare Providers: SeriousBroker.it  This test is not yet approved or cleared by the [place] FDA and has been authorized for detection and/or diagnosis of SARS-CoV-2 by FDA under an Emergency Use Authorization (EUA). This EUA will remain in effect (meaning this test can be used) for the duration of the COVID-19 declaration under Section 564(b)(1) of the Act, 21 U.S.C. section 360bbb-3(b)(1), unless the authorization is terminated or revoked.  Performed at [facility], [address] [place], [place] [address]      Patient was seen and examined on the day of discharge and was found to be in stable condition. Time coordinating discharge: 35 minutes including assessment and coordination of care, as well as examination of the patient.   SIGNED:  Noralee Stain, DO [facility] 06/03/2023, 2:22 PM

## 2023-06-04 NOTE — Telephone Encounter (Signed)
The best direct substitution would be zafirlukast.  We can try ordering this for him if he would like.  I do not know if he is ever tried it or if he can tolerate?

## 2023-06-10 ENCOUNTER — Telehealth: Payer: Self-pay | Admitting: Physical Medicine and Rehabilitation

## 2023-06-10 NOTE — Telephone Encounter (Signed)
Pt called to reschedule a back injection appt. Please call pt at [phone].

## 2023-06-11 NOTE — Progress Notes (Signed)
Remote pacemaker transmission.   

## 2023-06-14 NOTE — Telephone Encounter (Signed)
Spoke with patient and scheduled injection for 07/06/23. Patient aware driver needed

## 2023-06-15 ENCOUNTER — Telehealth: Payer: Self-pay | Admitting: Internal Medicine

## 2023-06-15 ENCOUNTER — Telehealth: Payer: Self-pay | Admitting: Physical Medicine and Rehabilitation

## 2023-06-15 NOTE — Telephone Encounter (Signed)
Spoke with patient and he wanted to know if he should get another CT of back. Informed him that Dr. Ethelene Hal has ordered a CT of lumbar and cervical and he just needs to call [facility] to schedule. Verbalized understanding

## 2023-06-15 NOTE — Progress Notes (Signed)
ADVANCED HF CLINIC NOTE  PCP: [name], MD Pulmonology: Dr Delton Coombes  HF Cardiologist: Dr. Gala Romney   Reason for Visit:  F/u for Chronic Systolic Heart Failure   HPI: Mr. Larch is a 67 y.o. male with h/o obesity, HTN, COPD with ongoing tobacco use and chronic systolic heart failure.   He underwent cath in 4/18 due to CP. Cath showed normal coronaries with EF 40% and global HK. LVEDP 19.   He saw Dr. Allyson Sabal on 07/01/2018 because of recurrent chest pain.  Myoview stress test performed 07/21/2018 showed EF 38% inferior scar with septal ischemia.  A 2D echo performed 07/07/2018 revealed an EF of 30 to 35%.  He was referred for cardiac CT.   Cardiac CT on 9/19.  Calcium score 148 (74th percentile)  LAD 25% otherwise normal cors. Dilated pulmonary arteries suggestive of PH.   Worked for [employer]. Retired 5/21. Follows with Dr. Delton Coombes in Pulmonary Clinic. Was started on bisoprolol 2.5 but couldn't tolerate due to fatigue. Was falling asleep at work. Stopped bisoprolol and felt better.   Echo 12/20 EF improved to 45%. Mild RV dysfunction   Admitted 1/22 for acute on chronic hypoxic respiratory failure secondary to acute COPD and CHF w/ volume overload. He had massive diuresis w/ IV Lasix, diuresed down from admit wt of 265 lb to 210 lb. Echo was repeated 12/21, EF 40-45% (unchanged). RV mildly reduced, RVSP 49.   Followed in clinic, Jardiance and Cleda Daub added - dose adjusted due to intolerances. He then stopped these on his own.   In 4/22, had near syncope, Zio showed intermittent CHB. Repeat echo EF 30-35%% Had St Jude dual chamber PM 04/16/21. Had some PMT and device adjusted by EP .   Echo 07/31/21 with EF 30-35%,  moderately reduced RV, and diffuse HK worse in inferior base.  Underwent R/L cath in 9/22 for persistent SOB. No CAD. EF 35% Mildly elevated filling pressures with normal output.  Ao =  128/75 (95) LV =  130/24 RA =  10 RV =  40/12 PA = 45/19 (32) PCW = 18  Fick  cardiac output/index = 5.1/2.3 PVR = 2.6 WU FA sat = 94% PA sat = 66%, 66%  Subsequently underwent CRT-D upgrade attempt on 10/07/21 by Dr. Elberta Fortis due to high-degree of RV pacing. 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   S/p Barostim 2/23.  Acute visit 02/25/22, weight up 4 lbs (225-227 lbs), ReDs 43%, & having a COPD flare. Lasix stopped and torsemide 40 mg bid x 3 days, then 40 mg daily started due to decreased urination on furosemide.   Follow up 4/23, NYHA III and volume stable. Low dose losartan re-started.  S/p BiV upgrade 11/11/22. Seen in ED 11/15/22 with [place] with no LUE DVT however exam limited by edema. Eventually found clot on left arm and started on Eliquis.   Echo 01/13/23 EF < 20%, RV mildly down.  RHC (3/24) showed normal left sided filling pressures, mild PAH and preserved CO/CI 5.2/2.3  Seen in ED 5/24 with LLE cellulitis. Started on Bactrim DS. Admitted 6/24 with a/c CHF. Diuresed with IV lasix. Ltd echo (6/24) EF 35-40%, RV low/normal. Torsemide increased to 40 mg bid, PRN metolazone continued. He was discharged home, weight 232 lbs.  Today he returns for post hospital HF follow up with his friend. Overall feeling fair. He has SOB with ADLs, has chronic cough. He has occasional dizziness and LE swelling. Continues with productive cough.  Had reaction to Bactrim and has not been able to swallow his montelukast since. Treated for thrush by Pulmonary. Denies CP, abnormal bleeding, palpitations, or PND/Orthopnea. Appetite ok. No fever or chills. Weight at home 225 pounds. Taking all medications. Took a metolazone yesterday. Smokes 3-4 cigs/day, wears BiPap w/ 4L oxygen at night and 2L oxygen during the day. Continues to struggle with low back pain.  Cardiac Studies: - Ltd echo (6/24): EF 35-40%, RV is low/normal  - RHC (3/24): mild PAH with normal left-sided filling pressures, mildly reduced CO/CI 5.2/2.3 RA 7, PA 39/20 (28), PCWP 13, CO/CI (Fick)  5.2/2.3, PVR 2.9 WU  - Echo (1/24): EF < 20%, RV mildly down  - Echo (8/22): EF 30-35% RV moderately reduced   - Echo (4/22): EF 30-35%, RV mildly reduced   - Echo (12/21): EF 40-45%, RV mildly reduced  - Echo (1/20): EF 40-45%, RV normal   - Echo (7/19): EF 30-35%, RV normal   Review of systems complete and found to be negative unless listed in HPI.    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   Requires supplemental oxygen    Current Outpatient Medications  Medication Sig Dispense Refill   albuterol (PROVENTIL) (2.5 MG/3ML) 0.083% nebulizer solution Take 3 mLs (2.5 mg total) by nebulization every 4 (four) hours as needed for wheezing or shortness of breath. 75 mL 11   albuterol (VENTOLIN HFA) 108 (90 Base) MCG/ACT inhaler INHALE 2 PUFFS INTO THE LUNGS EVERY 6 HOURS AS NEEDED FOR WHEEZING 18 g 4   allopurinol (ZYLOPRIM) 300 MG tablet Take 1 tablet (300 mg total) by mouth daily. 90 tablet 1   ALPRAZolam (XANAX) 0.5 MG tablet Take 1 tablet (0.5 mg total) by mouth 3 (three) times daily. (Patient taking differently: Take 0.5 mg by mouth 3 (three) times daily as needed for anxiety.) 270 tablet 0   AMBULATORY NON FORMULARY MEDICATION Medication Name: Nitroglycerin gel 0.125% apply 2-3 times daily to the anal canal. (Patient taking differently: Place 1 Application rectally daily as needed (Fissure). Medication Name: Nitroglycerin gel 0.125% apply 2-3 times daily to the anal canal.) 30 g 1   DALIRESP 500 MCG TABS tablet TAKE 1 TABLET(500  MCG) BY MOUTH DAILY 30 tablet 2   diclofenac Sodium (VOLTAREN) 1 % GEL Apply 1 Application topically in the morning and at bedtime.     EPINEPHrine 0.3 mg/0.3 mL IJ SOAJ injection Inject 0.3 mg into the muscle as needed for anaphylaxis. 1 each 0   fluticasone (FLONASE) 50 MCG/ACT nasal spray SHAKE LIQUID AND USE 2 SPRAYS IN EACH NOSTRIL DAILY (Patient taking differently: Place 1 spray into both nostrils daily as needed for allergies.) 16 g 2   guaiFENesin (MUCINEX) 600 MG 12 hr tablet Take 1 tablet (600 mg total) by mouth 2 (two) times daily. (Patient taking differently: Take 600 mg by mouth 2 (two) times daily as needed for  cough or to loosen phlegm.) 60 tablet 5   ipratropium (ATROVENT) 0.06 % nasal spray Place 2 sprays into both nostrils 4 (four) times daily as needed for rhinitis. 45 mL 3   lidocaine (XYLOCAINE) 5 % ointment Apply topically 3 (three) times daily as needed. 35.44 g 1   Lidocaine, Anorectal, 5 % CREA Apply 1 application topically 2 (two) times daily. (Patient taking differently: Apply 1 application  topically daily as needed (Fissure).) 30 g 1   loratadine (CLARITIN) 10 MG tablet Take 10 mg by mouth daily.     metolazone (ZAROXOLYN) 2.5 MG tablet Take 1 tablet (2.5 mg total) by mouth as directed. (Patient taking differently: Take 2.5 mg by mouth daily as needed (swelling).) 5 tablet 1   mometasone-formoterol (DULERA) 100-5 MCG/ACT AERO Inhale 2 puffs into the lungs 2 (two) times daily. 13 g 5   montelukast (SINGULAIR) 5 MG chewable tablet Chew 2 tablets (10 mg total) by mouth at bedtime. (Patient taking differently: Chew 10 mg by mouth at bedtime. As needed) 60 tablet 2   nicotine (NICODERM CQ - DOSED IN MG/24 HOURS) 14 mg/24hr patch Place 1 patch (14 mg total) onto the skin daily. 28 patch 0   nystatin (MYCOSTATIN) 100000 UNIT/ML suspension Take 5 mLs (500,000 Units total) by mouth in the morning, at noon, and at bedtime. 45 mL 0   omeprazole (PRILOSEC) 20 MG capsule TAKE 1  CAPSULE(20 MG) BY MOUTH TWICE DAILY BEFORE A MEAL FOR ACID REFLUX 180 capsule 0   potassium chloride SA (KLOR-CON M20) 20 MEQ tablet Take 1 tablet (20 mEq total) by mouth daily. Take an extra 40 meq when you take metolazone 90 tablet 3   Testosterone 20.25 MG/1.25GM (1.62%) GEL Place 1.25 g onto the skin daily. (Patient taking differently: Place 1 Pump onto the skin every other day.) 112.5 g 0   Tiotropium Bromide Monohydrate (SPIRIVA RESPIMAT) 2.5 MCG/ACT AERS Inhale 2 puffs into the lungs daily. 4 g 6   torsemide (DEMADEX) 20 MG tablet Take 2 tablets (40 mg total) by mouth 2 (two) times daily. 120 tablet 1   vitamin B-12 (CYANOCOBALAMIN) 1000 MCG tablet Take 1,000 mcg by mouth daily.     losartan (COZAAR) 25 MG tablet Take 1 tablet (25 mg total) by mouth daily. (Patient not taking: Reported on 06/16/2023) 90 tablet 3   No current facility-administered medications for this encounter.   Allergies  Allergen Reactions   Bee Venom Shortness Of Breath and Swelling   Cefdinir Other (See Comments)    Patient states he couldn't really breath    Spironolactone Anaphylaxis   Daliresp [Roflumilast] Other (See Comments)    Dizzy, headache, leg pain per patient. 02/25/22. Tolerates in low doses    Jardiance [Empagliflozin] Nausea Only and Other (See Comments)    Lightheadness Dizziness   Penicillins Swelling and Other (See Comments)    Childhood rxn--MD stated he "almost died" Has patient had a PCN reaction causing immediate rash, facial/tongue/throat swelling, SOB or lightheadedness with hypotension:Yes Has patient had a PCN reaction causing severe rash involving mucus membranes or skin necrosis:unsure Has patient had a PCN reaction that required hospitalization:unsure Has patient had a PCN reaction occurring within the last 10 years:No If all of the above answers are "NO", then may proceed with Cephalosporin use.     Clindamycin Other (See Comments)    Tongue swelling   Doxycycline Nausea And  Vomiting    Severe stomach upset per patient   E-Mycin [Erythromycin Base] Swelling  Rosuvastatin Other (See Comments)    Myalgias (intolerance)   Social History   Socioeconomic History   Marital status: Single    Spouse name: Not on file   Number of children: 1   Years of education: 47   Highest education level: 12th grade  Occupational History   Occupation: disabled  Tobacco Use   Smoking status: Some Days    Packs/day: 2.00    Years: 46.00    Additional pack years: 0.00    Total pack years: 92.00    Types: Cigarettes    Last attempt to quit: 01/27/2022    Years since quitting: 1.3    Passive exposure: Past   Smokeless tobacco: Never   Tobacco comments:    Had 3 cigarettes in past 2 weeks ARJ 05/18/23  Vaping Use   Vaping Use: Never used  Substance and Sexual Activity   Alcohol use: Not Currently    Alcohol/week: 28.0 standard drinks of alcohol    Types: 28 Cans of beer per week   Drug use: No   Sexual activity: Yes    Partners: Female    Birth control/protection: None  Other Topics Concern   Not on file  Social History Narrative   Right handed   Tea sometimes and coffee (1/2 caffeine)   Social Determinants of Health   Financial Resource Strain: Medium Risk (03/15/2023)   Overall Financial Resource Strain (CARDIA)    Difficulty of Paying Living Expenses: Somewhat hard  Food Insecurity: No Food Insecurity (06/02/2023)   Hunger Vital Sign    Worried About Running Out of Food in the Last Year: Never true    Ran Out of Food in the Last Year: Never true  Transportation Needs: No Transportation Needs (06/02/2023)   PRAPARE - Administrator, Civil Service (Medical): No    Lack of Transportation (Non-Medical): No  Physical Activity: Unknown (03/15/2023)   Exercise Vital Sign    Days of Exercise per Week: 0 days    Minutes of Exercise per Session: Not on file  Stress: Stress Concern Present (03/15/2023)   [employer] of Occupational Health -  Occupational Stress Questionnaire    Feeling of Stress : Rather much  Social Connections: Unknown (03/15/2023)   Social Connection and Isolation Panel [NHANES]    Frequency of Communication with Friends and Family: More than three times a week    Frequency of Social Gatherings with Friends and Family: Twice a week    Attends Religious Services: Patient declined    Database administrator or Organizations: No    Attends Engineer, structural: Not on file    Marital Status: Divorced  Intimate Partner Violence: Not At Risk (06/02/2023)   Humiliation, Afraid, Rape, and Kick questionnaire    Fear of Current or Ex-Partner: No    Emotionally Abused: No    Physically Abused: No    Sexually Abused: No   Family History  Problem Relation Age of Onset   Lung cancer Father    High blood pressure Mother    Diabetes Maternal Grandmother    Colon polyps Neg Hx    Esophageal cancer Neg Hx    Pancreatic cancer Neg Hx    Stomach cancer Neg Hx    Wt Readings from Last 3 Encounters:  06/16/23 103.9 kg (229 lb)  06/03/23 105.2 kg (232 lb)  05/20/23 108 kg (238 lb)   BP 102/78   Pulse 90   Wt 103.9 kg (229 lb)   SpO2  92%   BMI 32.86 kg/m   PHYSICAL EXAM: General:  NAD. No resp difficulty, arrived in [facility] HEENT: Normal Neck: Supple. No JVD, thick neck. Carotids 2+ bilat; no bruits. No lymphadenopathy or thryomegaly appreciated. Cor: PMI nondisplaced. Regular rate & rhythm. No rubs, gallops or murmurs. Lungs: Rhonchi throughout upper lobes Abdomen: Soft, obese, nontender, nondistended. No hepatosplenomegaly. No bruits or masses. Good bowel sounds. Extremities: No cyanosis, clubbing, rash, trace BLE edema Neuro: Alert & oriented x 3, cranial nerves grossly intact. Moves all 4 extremities w/o difficulty. Affect pleasant.  ECG (personally reviewed): NSR, BiV pacing 88 bpm, QRS 114 msec  Device interrogation (personally reviewed): CorVue stable, 99% BiV pacing, no AT/AF  ASSESSMENT &  PLAN: 1. Chronic systolic HF due to NICM  - cath 4/18 normal cors. EF 48%  - Echo 7/19  EF 30-35%. RV normal.  - Cardiac CT 9/19.  Calcium score 148 (74th percentile)  LAD 25% otherwise normal cors. Dilated pulmonary arteries suggestive of PH.  - Suspect NICM due to HTN and inadequately treated OSA (He now reports improved nightly compliance w/ CPAP). - Echo 1/22 EF 40-45%. RV mildly reduced. RVSP 49 - Admit 4/22 for symptomatic bradycardia/CHF. Echo LVEF 30%. RV mildly reduced - Echo 07/31/21 EF 30-35 % and RV moderately reduced.  - R/LHC 9/22 No CAD EF 35%, Mildly elevated filling pressures with normal output - Failed upgrade to CRT-D due to 99% RV pacing. However RV pacing significantly decreased.  - s/p Barostim. - now s/p BiV 11/23. - Echo (1/24): EF < 20%, RV mildly down - RHC (3/24): RA 7, PA 39/20 (28), PCWP 13, CO/CI (Fick) 5.2/2.3, PVR 2.9 WU - Chronically NHYA III-IIIb, COPD and obesity/ deconditioning contributing. Volume OK on exam and by CorVue. - Continue torsemide 40 mg daily + 20 KCL daily. - Continue metolazone 2.5 mg/extra 40 KCL PRN - No BP room to add losartan (dizzy and low BP with Entresto). - No spiro (H/o Anaphylaxis).  - Intolerant to Jardiance (positional dizziness and nausea).  - Intolerant bisoprolol due to fatigue, coreg due to dizziness, and toprol due to dry mouth.  - Given, CHB, biventricular dysfunction, moderate LVH and inability to tolerate HF meds due to orthostasis, there is concern about infiltrative CM but unable to get MRI with ICD (device MRI compatible per EP but shadowing may make scan less diagnostic).  - Multiple myeloma panel negative. He does not want to do PYP.  - Labs today. - EF has improved with BiV upgrade. Need to consider advanced therapies. Not a transplant candidate with COPD/smoking. He is willing to consider LVAD if necessary.   2. CHB:  - s/p PPM 4/22 - Followed by Dr. Elberta Fortis  - Failed CRT-D upgrade 10/22 due to subclavian  stenosis.  3. HTN - Stable - Plan to add back losartan when able, BP too soft today.   4. COPD  - GOLD Group D.  - Follows with Pulmonary (Dr. Delton Coombes).  - Home O2 PRN. - Discussed need for smoking cessation.  5. OSA - Continue Bipap.  6. Morbid obesity  - Previously discussed GLP1RA but he is not interested. - Body mass index is 32.86 kg/m.  7. Pulmonary Nodules - Followed by Dr. Delton Coombes. - CT scan 3/22 stable, annual screening advised.   8. DVT - L brachial vein 12/23. - Now off Eliquis.   Follow up in 3 months with Dr. Gala Romney  Jacklynn Ganong, FNP 3:21 PM

## 2023-06-15 NOTE — Telephone Encounter (Signed)
Patient returned call asked for a call back    [phone] 

## 2023-06-15 NOTE — Telephone Encounter (Signed)
Patient wants an ultrsound done on his liver Robert Church states that he does not need a prior authorization

## 2023-06-15 NOTE — Telephone Encounter (Signed)
Patient called asked for a call back concerning his CT scan. The number to contact patient is [phone]

## 2023-06-16 ENCOUNTER — Encounter (HOSPITAL_COMMUNITY): Payer: Self-pay

## 2023-06-16 ENCOUNTER — Ambulatory Visit
Admission: RE | Admit: 2023-06-16 | Discharge: 2023-06-16 | Disposition: A | Discharge status: Home / Self Care | Payer: Medicare Other | Source: Ambulatory Visit | Attending: Family Medicine | Admitting: Family Medicine

## 2023-06-16 VITALS — BP 102/78 | HR 90 | Wt 229.0 lb

## 2023-06-16 DIAGNOSIS — Z79899 Other long term (current) drug therapy: Secondary | ICD-10-CM | POA: Diagnosis not present

## 2023-06-16 DIAGNOSIS — I5022 Chronic systolic (congestive) heart failure: Secondary | ICD-10-CM

## 2023-06-16 DIAGNOSIS — R42 Dizziness and giddiness: Secondary | ICD-10-CM | POA: Diagnosis not present

## 2023-06-16 DIAGNOSIS — R911 Solitary pulmonary nodule: Secondary | ICD-10-CM

## 2023-06-16 DIAGNOSIS — F17210 Nicotine dependence, cigarettes, uncomplicated: Secondary | ICD-10-CM | POA: Insufficient documentation

## 2023-06-16 DIAGNOSIS — I442 Atrioventricular block, complete: Secondary | ICD-10-CM | POA: Diagnosis not present

## 2023-06-16 DIAGNOSIS — J449 Chronic obstructive pulmonary disease, unspecified: Secondary | ICD-10-CM

## 2023-06-16 DIAGNOSIS — I428 Other cardiomyopathies: Secondary | ICD-10-CM | POA: Insufficient documentation

## 2023-06-16 DIAGNOSIS — R110 Nausea: Secondary | ICD-10-CM | POA: Insufficient documentation

## 2023-06-16 DIAGNOSIS — Z6832 Body mass index (BMI) 32.0-32.9, adult: Secondary | ICD-10-CM | POA: Insufficient documentation

## 2023-06-16 DIAGNOSIS — E669 Obesity, unspecified: Secondary | ICD-10-CM

## 2023-06-16 DIAGNOSIS — I82622 Acute embolism and thrombosis of deep veins of left upper extremity: Secondary | ICD-10-CM

## 2023-06-16 DIAGNOSIS — I110 Hypertensive heart disease with heart failure: Secondary | ICD-10-CM | POA: Diagnosis not present

## 2023-06-16 DIAGNOSIS — Z950 Presence of cardiac pacemaker: Secondary | ICD-10-CM | POA: Insufficient documentation

## 2023-06-16 DIAGNOSIS — G4733 Obstructive sleep apnea (adult) (pediatric): Secondary | ICD-10-CM | POA: Diagnosis not present

## 2023-06-16 DIAGNOSIS — R682 Dry mouth, unspecified: Secondary | ICD-10-CM | POA: Insufficient documentation

## 2023-06-16 DIAGNOSIS — I10 Essential (primary) hypertension: Secondary | ICD-10-CM | POA: Diagnosis not present

## 2023-06-16 LAB — BASIC METABOLIC PANEL
Anion gap: 11 (ref 5–15)
BUN: 16 mg/dL (ref 8–23)
CO2: 28 mmol/L (ref 22–32)
Calcium: 9.1 mg/dL (ref 8.9–10.3)
Chloride: 98 mmol/L (ref 98–111)
Creatinine, Ser: 0.92 mg/dL (ref 0.61–1.24)
GFR, Estimated: >60 mL/min (ref >60)
Glucose, Bld: 111 mg/dL — ABNORMAL HIGH (ref 70–99)
Potassium: 4.2 mmol/L (ref 3.5–5.1)
Sodium: 137 mmol/L (ref 135–145)

## 2023-06-16 NOTE — Patient Instructions (Addendum)
EKG done today.  Labs done today. We will contact you only if your labs are abnormal.  TAKE EXTRA 2 TABLETS OF POTASSIUM WHEN TAKING METOLAZONE.  No other medication changes were made. Please continue all current medications as prescribed.  Your physician recommends that you schedule a follow-up appointment in: 3 months with Dr. Gala Church. Please contact our office in August to schedule a September appointment.   If you have any questions or concerns before your next appointment please send [place] a message through [place] or call our office at [phone].    TO LEAVE A MESSAGE FOR THE NURSE SELECT OPTION 2, PLEASE LEAVE A MESSAGE INCLUDING: YOUR NAME DATE OF BIRTH CALL BACK NUMBER REASON FOR CALL**this is important as we prioritize the call backs  YOU WILL RECEIVE A CALL BACK THE SAME DAY AS LONG AS YOU CALL BEFORE 4:00 PM   Do the following things EVERYDAY: Weigh yourself in the morning before breakfast. Write it down and keep it in a log. Take your medicines as prescribed Eat low salt foods--Limit salt (sodium) to 2000 mg per day.  Stay as active as you can everyday Limit all fluids for the day to less than 2 liters   At the [facility], you and your health needs are our priority. As part of our continuing mission to provide you with exceptional heart care, we have created designated Provider Care Teams. These Care Teams include your primary Cardiologist (physician) and Advanced Practice Providers (APPs- Physician Assistants and Nurse Practitioners) who all work together to provide you with the care you need, when you need it.   You may see any of the following providers on your designated Care Team at your next follow up: Dr Arvilla Meres Dr Marca Ancona Dr. Marcos Eke, NP Robbie Lis, [place] [facility] [place], [place] Brynda Peon, NP Karle Plumber, PharmD   Please be sure to bring in all your medications bottles to every  appointment.    Thank you for choosing [facility]

## 2023-06-17 ENCOUNTER — Encounter: Payer: Self-pay | Admitting: Student

## 2023-06-17 ENCOUNTER — Telehealth: Payer: Self-pay | Admitting: Physical Medicine and Rehabilitation

## 2023-06-17 ENCOUNTER — Ambulatory Visit: Payer: Medicare Other | Admitting: Student

## 2023-06-17 NOTE — Telephone Encounter (Signed)
Patient returned call asked for a call back    [phone] 

## 2023-06-17 NOTE — Telephone Encounter (Signed)
LVM to return call concerning CT scans

## 2023-06-18 ENCOUNTER — Telehealth: Payer: Self-pay | Admitting: Physical Medicine and Rehabilitation

## 2023-06-18 NOTE — Telephone Encounter (Signed)
LVM to return call.

## 2023-06-18 NOTE — Telephone Encounter (Signed)
Spoke with patient and he stated the CT scans he is supposed to have on the back are with contrast and he cannot have those. He wants to know if we can order the CT of cervical and lumbar on him at [facility] without contrast. Please advise

## 2023-06-18 NOTE — Telephone Encounter (Signed)
Patient returned call asked for a call back    [phone]

## 2023-06-21 NOTE — Telephone Encounter (Signed)
Spoke with patient and gave him all the information below. Verbalized undrstanding

## 2023-06-22 NOTE — Progress Notes (Deleted)
Cardiology Clinic Note   Patient Name: Robert Church Date of Encounter: 06/22/2023  Primary Care Provider:  [name], MD Primary Cardiologist:  Nanetta Batty, MD HF Cardiologist: Dr. Gala Romney   Patient Profile    67 y.o. male with h/o obesity, HTN, COPD with ongoing tobacco use and chronic systolic heart failure. Cardiac cath in 4/18 due to CP. Cath showed normal coronaries with EF 40% and global HK. LVEDP 19.  Myoview stress test performed 07/21/2018 showed EF 38% inferior scar with septal ischemia.  . Cardiac CT on 9/19. Calcium score 148 (74th percentile) LAD 25% otherwise normal cors. Dilated pulmonary arteries suggestive of PH.   In 4/22, had near syncope, Zio showed intermittent CHB. Repeat echo EF 30-35%% Had St Jude dual chamber PM 04/16/21 Subsequently underwent CRT-D upgrade attempt on 10/07/21 by Dr. Elberta Fortis due to high-degree of RV pacing. 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   Echo 01/13/23 EF < 20%, RV mildly down. Admitted 6/24 with a/c CHF. Diuresed with IV lasix. Ltd echo (6/24) EF 35-40%, RV low/normal. Torsemide increased to 40 mg bid, PRN metolazone continued. He was discharged home, weight 232 lbs. Not on spironolactone due to anaphylaxis Has refused amyloid work up. Negative for multiple myeloma.   Past Medical History    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   Requires supplemental oxygen    Past Surgical History:  Procedure Laterality Date   BIV UPGRADE N/A 10/07/2021    Procedure: BIV PPM UPGRADE;  Surgeon: Regan Lemming, MD;  Location: [facility];  Service: Cardiovascular;  Laterality: N/A;   BIV UPGRADE N/A 11/11/2022   Procedure: BIV PPM UPGRADE;  Surgeon: Regan Lemming, MD;  Location: [facility];  Service: Cardiovascular;  Laterality: N/A;   LEFT HEART CATH AND CORONARY ANGIOGRAPHY N/A 04/19/2017   Procedure: Left Heart Cath and Coronary Angiography;  Surgeon: Lyn Records, MD;  Location: [facility] CV [facility];  Service: Cardiovascular;  Laterality: N/A;   PACEMAKER IMPLANT N/A 04/16/2021   Procedure: PACEMAKER IMPLANT;  Surgeon: Regan Lemming, MD;  Location: [facility];  Service: Cardiovascular;  Laterality: N/A;   RIGHT HEART CATH N/A 03/09/2023   Procedure: RIGHT HEART CATH;  Surgeon: Dolores Patty, MD;  Location: [facility];  Service: Cardiovascular;  Laterality: N/A;   RIGHT/LEFT HEART CATH AND CORONARY ANGIOGRAPHY N/A 08/22/2021   Procedure: RIGHT/LEFT HEART CATH AND CORONARY ANGIOGRAPHY;  Surgeon: Dolores Patty, MD;  Location: [facility];  Service: Cardiovascular;  Laterality: N/A;   TONSILLECTOMY      Allergies  Allergies  Allergen Reactions   Bee Venom Shortness Of Breath and Swelling   Cefdinir Other (See Comments)    Patient states he couldn't really breath    Spironolactone Anaphylaxis   Bactrim [Sulfamethoxazole-Trimethoprim] Other (See Comments)    Mouth burning  Daliresp [Roflumilast] Other (See Comments)    Dizzy, headache, leg pain per patient. 02/25/22. Tolerates in low doses    Jardiance [Empagliflozin] Nausea Only and Other (See Comments)    Lightheadness Dizziness   Penicillins Swelling and Other (See Comments)    Childhood rxn--MD stated he "almost died" Has patient had a PCN reaction causing immediate rash, facial/tongue/throat swelling, SOB or lightheadedness with hypotension:Yes Has patient had a PCN reaction causing severe rash involving mucus  membranes or skin necrosis:unsure Has patient had a PCN reaction that required hospitalization:unsure Has patient had a PCN reaction occurring within the last 10 years:No If all of the above answers are "NO", then may proceed with Cephalosporin use.     Clindamycin Other (See Comments)    Tongue swelling   Doxycycline Nausea And Vomiting    Severe stomach upset per patient   E-Mycin [Erythromycin Base] Swelling   Rosuvastatin Other (See Comments)    Myalgias (intolerance)    History of Present Illness      Home Medications    Current Outpatient Medications  Medication Sig Dispense Refill   albuterol (PROVENTIL) (2.5 MG/3ML) 0.083% nebulizer solution Take 3 mLs (2.5 mg total) by nebulization every 4 (four) hours as needed for wheezing or shortness of breath. 75 mL 11   albuterol (VENTOLIN HFA) 108 (90 Base) MCG/ACT inhaler INHALE 2 PUFFS INTO THE LUNGS EVERY 6 HOURS AS NEEDED FOR WHEEZING 18 g 4   allopurinol (ZYLOPRIM) 300 MG tablet Take 1 tablet (300 mg total) by mouth daily. 90 tablet 1   ALPRAZolam (XANAX) 0.5 MG tablet Take 1 tablet (0.5 mg total) by mouth 3 (three) times daily. (Patient taking differently: Take 0.5 mg by mouth 3 (three) times daily as needed for anxiety.) 270 tablet 0   AMBULATORY NON FORMULARY MEDICATION Medication Name: Nitroglycerin gel 0.125% apply 2-3 times daily to the anal canal. (Patient taking differently: Place 1 Application rectally daily as needed (Fissure). Medication Name: Nitroglycerin gel 0.125% apply 2-3 times daily to the anal canal.) 30 g 1   DALIRESP 500 MCG TABS tablet TAKE 1 TABLET(500 MCG) BY MOUTH DAILY 30 tablet 2   diclofenac Sodium (VOLTAREN) 1 % GEL Apply 1 Application topically in the morning and at bedtime.     EPINEPHrine 0.3 mg/0.3 mL IJ SOAJ injection Inject 0.3 mg into the muscle as needed for anaphylaxis. 1 each 0   fluticasone (FLONASE) 50 MCG/ACT nasal spray SHAKE LIQUID AND USE 2 SPRAYS IN EACH NOSTRIL DAILY (Patient taking  differently: Place 1 spray into both nostrils daily as needed for allergies.) 16 g 2   guaiFENesin (MUCINEX) 600 MG 12 hr tablet Take 1 tablet (600 mg total) by mouth 2 (two) times daily. (Patient taking differently: Take 600 mg by mouth 2 (two) times daily as needed for cough or to loosen phlegm.) 60 tablet 5   ipratropium (ATROVENT) 0.06 % nasal spray Place 2 sprays into both nostrils 4 (four) times daily as needed for rhinitis. 45 mL 3   lidocaine (XYLOCAINE) 5 % ointment Apply topically 3 (three) times daily as needed. 35.44 g 1   Lidocaine, Anorectal, 5 % CREA Apply 1 application topically 2 (two) times daily. (Patient taking differently: Apply 1 application  topically daily as needed (Fissure).) 30 g 1   loratadine (CLARITIN) 10 MG tablet Take 10 mg by mouth daily.     losartan (COZAAR) 25 MG tablet Take 1 tablet (25 mg total) by mouth daily. (Patient not taking: Reported on 06/16/2023) 90 tablet  3   metolazone (ZAROXOLYN) 2.5 MG tablet Take 1 tablet (2.5 mg total) by mouth as directed. (Patient taking differently: Take 2.5 mg by mouth daily as needed (swelling).) 5 tablet 1   mometasone-formoterol (DULERA) 100-5 MCG/ACT AERO Inhale 2 puffs into the lungs 2 (two) times daily. 13 g 5   montelukast (SINGULAIR) 5 MG chewable tablet Chew 2 tablets (10 mg total) by mouth at bedtime. (Patient taking differently: Chew 10 mg by mouth at bedtime. As needed) 60 tablet 2   nicotine (NICODERM CQ - DOSED IN MG/24 HOURS) 14 mg/24hr patch Place 1 patch (14 mg total) onto the skin daily. 28 patch 0   nystatin (MYCOSTATIN) 100000 UNIT/ML suspension Take 5 mLs (500,000 Units total) by mouth in the morning, at noon, and at bedtime. 45 mL 0   omeprazole (PRILOSEC) 20 MG capsule TAKE 1 CAPSULE(20 MG) BY MOUTH TWICE DAILY BEFORE A MEAL FOR ACID REFLUX 180 capsule 0   potassium chloride SA (KLOR-CON M20) 20 MEQ tablet Take 1 tablet (20 mEq total) by mouth daily. Take an extra 40 meq when you take metolazone 90 tablet 3    Testosterone 20.25 MG/1.25GM (1.62%) GEL Place 1.25 g onto the skin daily. (Patient taking differently: Place 1 Pump onto the skin every other day.) 112.5 g 0   Tiotropium Bromide Monohydrate (SPIRIVA RESPIMAT) 2.5 MCG/ACT AERS Inhale 2 puffs into the lungs daily. 4 g 6   torsemide (DEMADEX) 20 MG tablet Take 2 tablets (40 mg total) by mouth 2 (two) times daily. 120 tablet 1   vitamin B-12 (CYANOCOBALAMIN) 1000 MCG tablet Take 1,000 mcg by mouth daily.     No current facility-administered medications for this visit.     Family History    Family History  Problem Relation Age of Onset   Lung cancer Father    High blood pressure Mother    Diabetes Maternal Grandmother    Colon polyps Neg Hx    Esophageal cancer Neg Hx    Pancreatic cancer Neg Hx    Stomach cancer Neg Hx    He indicated that his mother is alive. He indicated that his father is deceased. He indicated that his maternal grandmother is deceased. He indicated that his maternal grandfather is deceased. He indicated that his paternal grandmother is deceased. He indicated that his paternal grandfather is deceased. He indicated that his daughter is alive. He indicated that the status of his neg hx is unknown.  Social History    Social History   Socioeconomic History   Marital status: Single    Spouse name: Not on file   Number of children: 1   Years of education: 12   Highest education level: 12th grade  Occupational History   Occupation: disabled  Tobacco Use   Smoking status: Some Days    Packs/day: 2.00    Years: 46.00    Additional pack years: 0.00    Total pack years: 92.00    Types: Cigarettes    Last attempt to quit: 01/27/2022    Years since quitting: 1.4    Passive exposure: Past   Smokeless tobacco: Never   Tobacco comments:    Had 3 cigarettes in past 2 weeks ARJ 05/18/23  Vaping Use   Vaping Use: Never used  Substance and Sexual Activity   Alcohol use: Not Currently    Alcohol/week: 28.0 standard  drinks of alcohol    Types: 28 Cans of beer per week   Drug use: No   Sexual activity:  Yes    Partners: Female    Birth control/protection: None  Other Topics Concern   Not on file  Social History Narrative   Right handed   Tea sometimes and coffee (1/2 caffeine)   Social Determinants of Health   Financial Resource Strain: Medium Risk (03/15/2023)   Overall Financial Resource Strain (CARDIA)    Difficulty of Paying Living Expenses: Somewhat hard  Food Insecurity: No Food Insecurity (06/02/2023)   Hunger Vital Sign    Worried About Running Out of Food in the Last Year: Never true    Ran Out of Food in the Last Year: Never true  Transportation Needs: No Transportation Needs (06/02/2023)   PRAPARE - Administrator, Civil Service (Medical): No    Lack of Transportation (Non-Medical): No  Physical Activity: Unknown (03/15/2023)   Exercise Vital Sign    Days of Exercise per Week: 0 days    Minutes of Exercise per Session: Not on file  Stress: Stress Concern Present (03/15/2023)   [employer] of Occupational Health - Occupational Stress Questionnaire    Feeling of Stress : Rather much  Social Connections: Unknown (03/15/2023)   Social Connection and Isolation Panel [NHANES]    Frequency of Communication with Friends and Family: More than three times a week    Frequency of Social Gatherings with Friends and Family: Twice a week    Attends Religious Services: Patient declined    Database administrator or Organizations: No    Attends Engineer, structural: Not on file    Marital Status: Divorced  Intimate Partner Violence: Not At Risk (06/02/2023)   Humiliation, Afraid, Rape, and Kick questionnaire    Fear of Current or Ex-Partner: No    Emotionally Abused: No    Physically Abused: No    Sexually Abused: No     Review of Systems    General:  No chills, fever, night sweats or weight changes.  Cardiovascular:  No chest pain, dyspnea on exertion, edema,  orthopnea, palpitations, paroxysmal nocturnal dyspnea. Dermatological: No rash, lesions/masses Respiratory: No cough, dyspnea Urologic: No hematuria, dysuria Abdominal:   No nausea, vomiting, diarrhea, bright red blood per rectum, melena, or hematemesis Neurologic:  No visual changes, wkns, changes in mental status. All other systems reviewed and are otherwise negative except as noted above.       Physical Exam    VS:  There were no vitals taken for this visit. , BMI There is no height or weight on file to calculate BMI.     GEN: Well nourished, well developed, in no acute distress. HEENT: normal. Neck: Supple, no JVD, carotid bruits, or masses. Cardiac: RRR, no murmurs, rubs, or gallops. No clubbing, cyanosis, edema.  Radials/DP/PT 2+ and equal bilaterally.  Respiratory:  Respirations regular and unlabored, clear to auscultation bilaterally. GI: Soft, nontender, nondistended, BS + x 4. MS: no deformity or atrophy. Skin: warm and dry, no rash. Neuro:  Strength and sensation are intact. Psych: Normal affect.      Lab Results  Component Value Date   WBC 9.3 06/02/2023   HGB 16.4 06/02/2023   HCT 49.1 06/02/2023   MCV 98.0 06/02/2023   PLT 178 06/02/2023   Lab Results  Component Value Date   CREATININE 0.92 06/16/2023   BUN 16 06/16/2023   NA 137 06/16/2023   K 4.2 06/16/2023   CL 98 06/16/2023   CO2 28 06/16/2023   Lab Results  Component Value Date   ALT 20 04/05/2023  AST 20 04/05/2023   ALKPHOS 49 04/05/2023   BILITOT 0.6 04/05/2023   Lab Results  Component Value Date   CHOL 221 (H) 03/17/2023   HDL 45.50 03/17/2023   LDLCALC 136 (H) 03/17/2023   LDLDIRECT 157.0 09/15/2022   TRIG 196.0 (H) 03/17/2023   CHOLHDL 5 03/17/2023    Lab Results  Component Value Date   HGBA1C 6.0 05/27/2023     Review of Prior Studies   Echocardiogram 06/02/2023 1. Left ventricular ejection fraction, by estimation, is 35 to 40%. The  left ventricle has moderately decreased  function. The left ventricle  demonstrates regional wall motion abnormalities (see scoring  diagram/findings for description). The left  ventricular internal cavity size was severely dilated. Left ventricular  diastolic parameters are indeterminate.   2. Right ventricular systolic function is low normal. The right  ventricular size is normal.   3. The mitral valve is normal in structure. No evidence of mitral valve  regurgitation. No evidence of mitral stenosis.   4. The aortic valve is normal in structure. Aortic valve regurgitation is  trivial.   5. The inferior vena cava is dilated in size with >50% respiratory  variability, suggesting right atrial pressure of 8 mmHg.    RHC 03/09/2023 Findings:   RA = 7 RV = 37/11 PA = 39/20 (28) PCW = 13 Fick cardiac output/index = 5.2/2.3 PVR = 2.9 WU FA sat = 91% PA sat = 63%   Assessment: 1. Mild PAH with normal left-sided filling pressures 2. Mildly reduced cardiac output   Plan/Discussion:    Hemodynamics improved with CRT.    Arvilla Meres, MD  Assessment & Plan   1.  ***     {Are you ordering a CV Procedure (e.g. stress test, cath, DCCV, TEE, etc)?   Press F2        :[id number]}   Signed, Bettey Mare. Liborio Nixon, ANP, AACC   06/22/2023 12:40 PM      Office [phone] Fax [phone]  Notice: This dictation was prepared with Dragon dictation along with smaller phrase technology. Any transcriptional errors that result from this process are unintentional and may not be corrected upon review.

## 2023-06-23 ENCOUNTER — Encounter: Payer: Self-pay | Admitting: Cardiology

## 2023-06-27 DIAGNOSIS — J449 Chronic obstructive pulmonary disease, unspecified: Secondary | ICD-10-CM | POA: Diagnosis not present

## 2023-06-27 DIAGNOSIS — J9610 Chronic respiratory failure, unspecified whether with hypoxia or hypercapnia: Secondary | ICD-10-CM | POA: Diagnosis not present

## 2023-06-28 ENCOUNTER — Ambulatory Visit: Payer: Medicare Other | Admitting: Adult Health

## 2023-06-28 DIAGNOSIS — J449 Chronic obstructive pulmonary disease, unspecified: Secondary | ICD-10-CM | POA: Diagnosis not present

## 2023-07-05 ENCOUNTER — Other Ambulatory Visit: Payer: Self-pay | Admitting: Internal Medicine

## 2023-07-05 ENCOUNTER — Ambulatory Visit: Payer: Medicare Other | Admitting: Cardiology

## 2023-07-05 DIAGNOSIS — L720 Epidermal cyst: Secondary | ICD-10-CM | POA: Diagnosis not present

## 2023-07-05 DIAGNOSIS — F411 Generalized anxiety disorder: Secondary | ICD-10-CM

## 2023-07-06 ENCOUNTER — Other Ambulatory Visit: Payer: Self-pay | Admitting: Internal Medicine

## 2023-07-06 ENCOUNTER — Ambulatory Visit (INDEPENDENT_AMBULATORY_CARE_PROVIDER_SITE_OTHER): Payer: Medicare Other | Admitting: Physical Medicine and Rehabilitation

## 2023-07-06 ENCOUNTER — Encounter: Payer: Self-pay | Admitting: Physical Medicine and Rehabilitation

## 2023-07-06 ENCOUNTER — Telehealth: Payer: Self-pay | Admitting: Internal Medicine

## 2023-07-06 ENCOUNTER — Other Ambulatory Visit: Payer: Self-pay

## 2023-07-06 VITALS — BP 107/74 | HR 96

## 2023-07-06 DIAGNOSIS — M48062 Spinal stenosis, lumbar region with neurogenic claudication: Secondary | ICD-10-CM

## 2023-07-06 DIAGNOSIS — F411 Generalized anxiety disorder: Secondary | ICD-10-CM

## 2023-07-06 DIAGNOSIS — M47816 Spondylosis without myelopathy or radiculopathy, lumbar region: Secondary | ICD-10-CM

## 2023-07-06 MED ORDER — METHYLPREDNISOLONE ACETATE 80 MG/ML IJ SUSP
80.0000 mg | Freq: Once | INTRAMUSCULAR | Status: AC
  Administered 2023-07-06: 80 mg

## 2023-07-06 MED ORDER — ALPRAZOLAM 0.5 MG PO TABS
0.5000 mg | ORAL_TABLET | Freq: Three times a day (TID) | ORAL | 0 refills | Status: DC
Start: 2023-07-06 — End: 2023-07-08

## 2023-07-06 NOTE — Telephone Encounter (Signed)
Patient called and asked why his ALPRAZolam (XANAX) 0.5 MG tablet was denied. All the reason said was that the refill is not appropriate. Patient's next OV is 07/19/2023. Patient would like a call back at [phone].

## 2023-07-06 NOTE — Patient Instructions (Signed)

## 2023-07-06 NOTE — Progress Notes (Signed)
Functional Pain Scale - descriptive words and definitions  Unmanageable (7)  Pain interferes with normal ADL's/nothing seems to help/sleep is very difficult/active distractions are very difficult to concentrate on. Severe range order  Average Pain 8  Pain between shoulder blades and lower back.  Pain stays in back.  Constant pain all the time. Taking antibiotics and BC powder   +Driver, -BT, -Dye Allergies.

## 2023-07-06 NOTE — Telephone Encounter (Signed)
Rx was denied due to needing appt. He must see Dr. Yetta Barre before he approved meds.Marland KitchenRaechel Chute

## 2023-07-06 NOTE — Progress Notes (Signed)
Cardiology Clinic Note   Patient Name: Robert Church Date of Encounter: 07/08/2023  Primary Care Provider:  [name], MD Primary Cardiologist:  Nanetta Batty, MD  Patient Profile    67 y.o. male with h/o obesity, HTN, COPD with ongoing tobacco use and chronic systolic heart failure. He underwent cath in 4/18 due to CP. Cath showed normal coronaries with EF 40% and global HK. LVEDP 19. Was hospitalized 1/22 for acute on chronic hypoxic respiratory failure secondary to acute COPD and CHF w/ volume overload. He had massive diuresis w/ IV Lasix, diuresed down from admit wt of 265 lb to 210 lb. Echo was repeated 12/21, EF 40-45% (unchanged). RV mildly reduced, RVSP 49.   He has St. Jude PPM after Zio monitor revealed intermittent CHB on 4/22/. S/p BiV upgrade 11/11/22. Seen in ED 11/15/22 with [place] with no LUE DVT however exam limited by edema. Eventually found clot on left arm and started on Eliquis. This has not been discontinued.    Most recently he has been admitted on 6/24 for decompensated systolic CHF. Echo revealed EF of 35%-40%. Home weight 232 lbs. He is followed by [facility]. He in on 02 via [place] at 2/L during the day and BiPAP at night. He unfortunately continues to smoke.   Past Medical History    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   Requires supplemental oxygen    Past Surgical History:  Procedure Laterality Date   BIV UPGRADE N/A 10/07/2021   Procedure: BIV PPM UPGRADE;  Surgeon: Regan Lemming, MD;  Location: [facility];  Service: Cardiovascular;  Laterality: N/A;   BIV UPGRADE N/A 11/11/2022   Procedure: BIV PPM UPGRADE;  Surgeon: Regan Lemming, MD;  Location: [facility];  Service: Cardiovascular;  Laterality: N/A;   LEFT HEART CATH AND CORONARY ANGIOGRAPHY N/A 04/19/2017   Procedure: Left Heart Cath and Coronary Angiography;  Surgeon: Lyn Records, MD;  Location: [facility]VAS[facility] CV LAB;  Service: Cardiovascular;  Laterality: N/A;   PACEMAKER IMPLANT N/A 04/16/2021   Procedure: PACEMAKER IMPLANT;  Surgeon: Regan Lemming, MD;  Location: [facility] CV [facility];  Service: Cardiovascular;  Laterality: N/A;   RIGHT HEART CATH N/A 03/09/2023   Procedure: RIGHT HEART CATH;  Surgeon: Dolores Patty, MD;  Location: [facility];  Service: Cardiovascular;  Laterality: N/A;   RIGHT/LEFT HEART CATH AND CORONARY ANGIOGRAPHY N/A 08/22/2021   Procedure: RIGHT/LEFT HEART CATH AND CORONARY ANGIOGRAPHY;  Surgeon: Dolores Patty, MD;  Location: [facility];  Service: Cardiovascular;  Laterality: N/A;   TONSILLECTOMY      Allergies  Allergies  Allergen Reactions   Bee Venom Shortness Of Breath and Swelling   Cefdinir Other (See Comments)    Patient states he couldn't really breath    Spironolactone Anaphylaxis   Bactrim [Sulfamethoxazole-Trimethoprim] Other (See Comments)  Mouth burning   Daliresp [Roflumilast] Other (See Comments)    Dizzy, headache, leg pain per patient. 02/25/22. Tolerates in low doses    Jardiance [Empagliflozin] Nausea Only and Other (See Comments)    Lightheadness Dizziness   Penicillins Swelling and Other (See Comments)    Childhood rxn--MD stated he "almost died" Has patient had a PCN reaction causing immediate rash, facial/tongue/throat swelling, SOB or lightheadedness with hypotension:Yes Has patient had a PCN reaction causing severe rash involving mucus membranes or skin necrosis:unsure Has patient had a PCN  reaction that required hospitalization:unsure Has patient had a PCN reaction occurring within the last 10 years:No If all of the above answers are "NO", then may proceed with Cephalosporin use.     Clindamycin Other (See Comments)    Tongue swelling   Doxycycline Nausea And Vomiting    Severe stomach upset per patient   E-Mycin [Erythromycin Base] Swelling   Rosuvastatin Other (See Comments)    Myalgias (intolerance)    History of Present Illness    Mr. Maragh comes today for ongoing assessment and management of chronic HFrEF, EF of 40%, recent hospitalization in June 2024 for decompensated systolic CHF with a repeat echocardiogram revealing decreased EF of 35 to 40%.  Since being discharged the patient has had to take 1 dose of metolazone due to fluid retention.  The patient has been seen by advanced heart failure clinic on 06/16/2023 for posthospitalization TOC.  He was continued on GDMT and was told to take additional metolazone for weight gain or fluid retention.  He comes today without any significant cardiac complaints.  He has been weighing himself daily, avoiding salt.  Weight at home was 226 pounds this morning.  He is weighing daily.  He is medically compliant.  He has cut down on tobacco use to 5 cigarettes a day and is doing his best to quit he does have a patch.  He continues on BiPAP at home but does not wear oxygen during the day.  He states he needs to follow-up with his BiPAP manufacture as it is been malfunctioning.  He states he is sent several messages and has not yet had the machine evaluated.  He continues to have pacemaker interrogation with ICD in situ per protocol.  Home Medications    Current Outpatient Medications  Medication Sig Dispense Refill   albuterol (PROVENTIL) (2.5 MG/3ML) 0.083% nebulizer solution Take 3 mLs (2.5 mg total) by nebulization every 4 (four) hours as needed for wheezing or shortness of breath. 75 mL 11   albuterol (VENTOLIN HFA) 108 (90 Base)  MCG/ACT inhaler INHALE 2 PUFFS INTO THE LUNGS EVERY 6 HOURS AS NEEDED FOR WHEEZING 18 g 4   allopurinol (ZYLOPRIM) 300 MG tablet Take 1 tablet (300 mg total) by mouth daily. 90 tablet 1   AMBULATORY NON FORMULARY MEDICATION Medication Name: Nitroglycerin gel 0.125% apply 2-3 times daily to the anal canal. (Patient taking differently: Place 1 Application rectally daily as needed (Fissure). Medication Name: Nitroglycerin gel 0.125% apply 2-3 times daily to the anal canal.) 30 g 1   DALIRESP 500 MCG TABS tablet TAKE 1 TABLET(500 MCG) BY MOUTH DAILY 30 tablet 2   diclofenac Sodium (VOLTAREN) 1 % GEL Apply 1 Application topically in the morning and at bedtime.     EPINEPHrine 0.3 mg/0.3 mL IJ SOAJ injection Inject 0.3 mg into the muscle as needed for anaphylaxis. 1 each 0   fluticasone (FLONASE) 50 MCG/ACT nasal spray SHAKE LIQUID AND USE 2 SPRAYS IN [facility]  NOSTRIL DAILY (Patient taking differently: Place 1 spray into both nostrils daily as needed for allergies.) 16 g 2   guaiFENesin (MUCINEX) 600 MG 12 hr tablet Take 1 tablet (600 mg total) by mouth 2 (two) times daily. (Patient taking differently: Take 600 mg by mouth 2 (two) times daily as needed for cough or to loosen phlegm.) 60 tablet 5   ipratropium (ATROVENT) 0.06 % nasal spray Place 2 sprays into both nostrils 4 (four) times daily as needed for rhinitis. 45 mL 3   lidocaine (XYLOCAINE) 5 % ointment Apply topically 3 (three) times daily as needed. 35.44 g 1   Lidocaine, Anorectal, 5 % CREA Apply 1 application topically 2 (two) times daily. (Patient taking differently: Apply 1 application  topically daily as needed (Fissure).) 30 g 1   loratadine (CLARITIN) 10 MG tablet Take 10 mg by mouth daily.     losartan (COZAAR) 25 MG tablet Take 1 tablet (25 mg total) by mouth daily. 90 tablet 3   metolazone (ZAROXOLYN) 2.5 MG tablet Take 1 tablet (2.5 mg total) by mouth as directed. (Patient taking differently: Take 2.5 mg by mouth daily as needed (swelling).) 5  tablet 1   mometasone-formoterol (DULERA) 100-5 MCG/ACT AERO Inhale 2 puffs into the lungs 2 (two) times daily. 13 g 5   montelukast (SINGULAIR) 5 MG chewable tablet Chew 2 tablets (10 mg total) by mouth at bedtime. (Patient taking differently: Chew 10 mg by mouth at bedtime. As needed) 60 tablet 2   nicotine (NICODERM CQ - DOSED IN MG/24 HOURS) 14 mg/24hr patch Place 1 patch (14 mg total) onto the skin daily. 28 patch 0   nystatin (MYCOSTATIN) 100000 UNIT/ML suspension Take 5 mLs (500,000 Units total) by mouth in the morning, at noon, and at bedtime. 45 mL 0   omeprazole (PRILOSEC) 20 MG capsule TAKE 1 CAPSULE(20 MG) BY MOUTH TWICE DAILY BEFORE A MEAL FOR ACID REFLUX 180 capsule 0   potassium chloride SA (KLOR-CON M20) 20 MEQ tablet Take 1 tablet (20 mEq total) by mouth daily. Take an extra 40 meq when you take metolazone 90 tablet 3   Testosterone 20.25 MG/1.25GM (1.62%) GEL Place 1.25 g onto the skin daily. (Patient taking differently: Place 1 Pump onto the skin every other day.) 112.5 g 0   Tiotropium Bromide Monohydrate (SPIRIVA RESPIMAT) 2.5 MCG/ACT AERS Inhale 2 puffs into the lungs daily. 4 g 6   torsemide (DEMADEX) 20 MG tablet Take 2 tablets (40 mg total) by mouth 2 (two) times daily. 120 tablet 1   vitamin B-12 (CYANOCOBALAMIN) 1000 MCG tablet Take 1,000 mcg by mouth daily.     ALPRAZolam (XANAX) 0.5 MG tablet Take 1 tablet (0.5 mg total) by mouth 3 (three) times daily. 270 tablet 0   Current Facility-Administered Medications  Medication Dose Route Frequency Provider Last Rate Last Admin   methylPREDNISolone acetate (DEPO-MEDROL) injection 80 mg  80 mg Other Once          Family History    Family History  Problem Relation Age of Onset   Lung cancer Father    High blood pressure Mother    Diabetes Maternal Grandmother    Colon polyps Neg Hx    Esophageal cancer Neg Hx    Pancreatic cancer Neg Hx    Stomach cancer Neg Hx    He indicated that his mother is alive. He indicated  that his father is deceased. He indicated that his maternal grandmother is deceased. He indicated that his maternal grandfather  is deceased. He indicated that his paternal grandmother is deceased. He indicated that his paternal grandfather is deceased. He indicated that his daughter is alive. He indicated that the status of his neg hx is unknown.  Social History    Social History   Socioeconomic History   Marital status: Single    Spouse name: Not on file   Number of children: 1   Years of education: 12   Highest education level: 12th grade  Occupational History   Occupation: disabled  Tobacco Use   Smoking status: Some Days    Current packs/day: 0.00    Average packs/day: 2.0 packs/day for 46.0 years (92.0 ttl pk-yrs)    Types: Cigarettes    Start date: 01/28/1976    Last attempt to quit: 01/27/2022    Years since quitting: 1.4    Passive exposure: Past   Smokeless tobacco: Never   Tobacco comments:    Had 3 cigarettes in past 2 weeks ARJ 05/18/23  Vaping Use   Vaping status: Never Used  Substance and Sexual Activity   Alcohol use: Not Currently    Alcohol/week: 28.0 standard drinks of alcohol    Types: 28 Cans of beer per week   Drug use: No   Sexual activity: Yes    Partners: Female    Birth control/protection: None  Other Topics Concern   Not on file  Social History Narrative   Right handed   Tea sometimes and coffee (1/2 caffeine)   Social Determinants of Health   Financial Resource Strain: Medium Risk (03/15/2023)   Overall Financial Resource Strain (CARDIA)    Difficulty of Paying Living Expenses: Somewhat hard  Food Insecurity: No Food Insecurity (06/02/2023)   Hunger Vital Sign    Worried About Running Out of Food in the Last Year: Never true    Ran Out of Food in the Last Year: Never true  Transportation Needs: No Transportation Needs (06/02/2023)   PRAPARE - Administrator, Civil Service (Medical): No    Lack of Transportation (Non-Medical): No   Physical Activity: Unknown (03/15/2023)   Exercise Vital Sign    Days of Exercise per Week: 0 days    Minutes of Exercise per Session: Not on file  Stress: Stress Concern Present (03/15/2023)   [employer] of Occupational Health - Occupational Stress Questionnaire    Feeling of Stress : Rather much  Social Connections: Unknown (03/15/2023)   Social Connection and Isolation Panel [NHANES]    Frequency of Communication with Friends and Family: More than three times a week    Frequency of Social Gatherings with Friends and Family: Twice a week    Attends Religious Services: Patient declined    Database administrator or Organizations: No    Attends Engineer, structural: Not on file    Marital Status: Divorced  Intimate Partner Violence: Not At Risk (06/02/2023)   Humiliation, Afraid, Rape, and Kick questionnaire    Fear of Current or Ex-Partner: No    Emotionally Abused: No    Physically Abused: No    Sexually Abused: No     Review of Systems    General:  No chills, fever, night sweats or weight changes.  Cardiovascular:  No chest pain, positive for occasional dyspnea on exertion, edema, orthopnea, palpitations, paroxysmal nocturnal dyspnea. Dermatological: No rash, lesions/masses Respiratory: No cough, dyspnea Urologic: No hematuria, dysuria Abdominal:   No nausea, vomiting, diarrhea, bright red blood per rectum, melena, or hematemesis Neurologic:  No visual  changes, wkns, changes in mental status. All other systems reviewed and are otherwise negative except as noted above.       Physical Exam    VS:  BP 132/89 (BP Location: Right Arm, Patient Position: Sitting, Cuff Size: Normal)   Pulse 86   Ht 5\' 10"  (1.778 m)   Wt 230 lb 3.2 oz (104.4 kg)   SpO2 94%   BMI 33.03 kg/m  , BMI Body mass index is 33.03 kg/m.     GEN: Well nourished, well developed, in no acute distress. HEENT: normal. Neck: Supple, no JVD, carotid bruits, or masses. Cardiac: RRR, murmurs,  rubs, or gallops. No clubbing, cyanosis, edema.  Radials/DP/PT 2+ and equal bilaterally.  Respiratory:  Respirations regular and unlabored, clear to auscultation bilaterally. GI: Soft, nontender, nondistended, BS + x 4. MS: no deformity or atrophy. Skin: warm and dry, no rash. Neuro:  Strength and sensation are intact. Psych: Normal affect.      Lab Results  Component Value Date   WBC 9.3 06/02/2023   HGB 16.4 06/02/2023   HCT 49.1 06/02/2023   MCV 98.0 06/02/2023   PLT 178 06/02/2023   Lab Results  Component Value Date   CREATININE 0.92 06/16/2023   BUN 16 06/16/2023   NA 137 06/16/2023   K 4.2 06/16/2023   CL 98 06/16/2023   CO2 28 06/16/2023   Lab Results  Component Value Date   ALT 20 04/05/2023   AST 20 04/05/2023   ALKPHOS 49 04/05/2023   BILITOT 0.6 04/05/2023   Lab Results  Component Value Date   CHOL 221 (H) 03/17/2023   HDL 45.50 03/17/2023   LDLCALC 136 (H) 03/17/2023   LDLDIRECT 157.0 09/15/2022   TRIG 196.0 (H) 03/17/2023   CHOLHDL 5 03/17/2023    Lab Results  Component Value Date   HGBA1C 6.0 05/27/2023     Review of Prior Studies Echocardiogram 06/02/2023 1. Left ventricular ejection fraction, by estimation, is 35 to 40%. The  left ventricle has moderately decreased function. The left ventricle  demonstrates regional wall motion abnormalities (see scoring  diagram/findings for description). The left  ventricular internal cavity size was severely dilated. Left ventricular  diastolic parameters are indeterminate.   2. Right ventricular systolic function is low normal. The right  ventricular size is normal.   3. The mitral valve is normal in structure. No evidence of mitral valve  regurgitation. No evidence of mitral stenosis.   4. The aortic valve is normal in structure. Aortic valve regurgitation is  trivial.   5. The inferior vena cava is dilated in size with >50% respiratory  variability, suggesting right atrial pressure of 8 mmHg.    Comparison(s): Prior images reviewed side by side. Septal function has  improved from prior, accounting for increase in LVEF.    R/L cath in 9/22   Persistent SOB. No CAD. EF 35% Mildly elevated filling pressures with normal output.  Ao =  128/75 (95) LV =  130/24 RA =  10 RV =  40/12 PA = 45/19 (32) PCW = 18  Fick cardiac output/index = 5.1/2.3 PVR = 2.6 WU FA sat = 94% PA sat = 66%, 66%   Subsequently underwent CRT-D upgrade attempt on 10/07/21 by Dr. Elberta Fortis due to high-degree of RV pacing. 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    Assessment & Plan   1.  Chronic systolic heart failure in the setting of nonischemic cardiomyopathy: He is doing very well and does  not have any evidence of volume overload today.  His weight was 226 pounds at home on his own scale.  He is only had to take 1 dose of metolazone for weight gain since discharge from the hospital.  He is medically compliant.  He is being very careful with salt content as he does not wish to be readmitted to the hospital.  He will continue to be followed by advanced heart failure clinic, I will not make any changes in his medication regimen at this time.  2.  History of complete heart block: Patient has CRT-D in situ and this is followed by Dr. Elberta Fortis with remote transmissions per protocol.  3.  OSA: The patient is on BiPAP at home.  He states he needs to follow-up with the manufacturer concerning getting this machine checked.  He has left several messages to have this machine checked as he states it turns on and off on its own.  He worries that it turns off during the night when he is using it.  May need help through pulmonology in order to get this taken care of.  4.  Ongoing tobacco abuse: He has decreased his cigarette smoking down to 5 cigarettes a day and is working on decreasing it even further.  He does have a nicotine patch.  I have congratulated him on this endeavor and really  encouraged him to stop altogether.          Signed, Bettey Mare. Liborio Nixon, ANP, AACC   07/08/2023 5:08 PM      Office [phone] Fax [phone]  Notice: This dictation was prepared with Dragon dictation along with smaller phrase technology. Any transcriptional errors that result from this process are unintentional and may not be corrected upon review.

## 2023-07-06 NOTE — Telephone Encounter (Signed)
Advise pt why his RX was denied pt stated he has an upcoming appt  7/29 is there any way he can get a temporary. Please advise

## 2023-07-07 NOTE — Telephone Encounter (Signed)
Pt called back checking on the status if he can have a temporary refill.

## 2023-07-08 ENCOUNTER — Telehealth: Payer: Self-pay | Admitting: Emergency Medicine

## 2023-07-08 ENCOUNTER — Ambulatory Visit: Payer: Medicare Other | Attending: Cardiovascular Disease | Admitting: Adult Health

## 2023-07-08 ENCOUNTER — Encounter: Payer: Self-pay | Admitting: Adult Health

## 2023-07-08 ENCOUNTER — Other Ambulatory Visit: Payer: Self-pay | Admitting: Internal Medicine

## 2023-07-08 VITALS — BP 132/89 | HR 86 | Ht 70.0 in | Wt 230.2 lb

## 2023-07-08 DIAGNOSIS — I5022 Chronic systolic (congestive) heart failure: Secondary | ICD-10-CM

## 2023-07-08 DIAGNOSIS — F411 Generalized anxiety disorder: Secondary | ICD-10-CM

## 2023-07-08 MED ORDER — ALPRAZOLAM 0.5 MG PO TABS
0.5000 mg | ORAL_TABLET | Freq: Three times a day (TID) | ORAL | 0 refills | Status: DC
Start: 2023-07-08 — End: 2023-10-04

## 2023-07-08 NOTE — Patient Instructions (Signed)
Medication Instructions:  No Changes *If you need a refill on your cardiac medications before your next appointment, please call your pharmacy*   Lab Work: No Labs If you have labs (blood work) drawn today and your tests are completely normal, you will receive your results only by: MyChart Message (if you have MyChart) OR A paper copy in the mail If you have any lab test that is abnormal or we need to change your treatment, we will call you to review the results.   Testing/Procedures: No Testing   Follow-Up: At [facility], you and your health needs are our priority.  As part of our continuing mission to provide you with exceptional heart care, we have created designated Provider Care Teams.  These Care Teams include your primary Cardiologist (physician) and Advanced Practice Providers (APPs -  Physician Assistants and Nurse Practitioners) who all work together to provide you with the care you need, when you need it.  We recommend signing up for the patient portal called "MyChart".  Sign up information is provided on this After Visit Summary.  MyChart is used to connect with patients for Virtual Visits (Telemedicine).  Patients are able to view lab/test results, encounter notes, upcoming appointments, etc.  Non-urgent messages can be sent to your provider as well.   To learn more about what you can do with MyChart, go to [email or web address].    Your next appointment:   6 month(s)  Provider:   Jonathan Berry, MD     

## 2023-07-08 NOTE — Telephone Encounter (Signed)
Patient said pharmacy has not received the refill sent on 07/06/23. He would like to know if it can be re-sent.

## 2023-07-08 NOTE — Telephone Encounter (Signed)
Patient called and states that [facility] did not receive this RX - Please resend

## 2023-07-08 NOTE — Telephone Encounter (Signed)
Pt came in to drop off form for Dr. Delton Coombes signature. Documents were placed in Dr. Delton Coombes box 07/08/2023

## 2023-07-09 ENCOUNTER — Other Ambulatory Visit: Payer: Self-pay

## 2023-07-09 MED ORDER — SPIRIVA RESPIMAT 2.5 MCG/ACT IN AERS: 2.0000 | INHALATION_SPRAY | Freq: Every day | RESPIRATORY_TRACT | 6 refills | Status: DC

## 2023-07-12 ENCOUNTER — Other Ambulatory Visit: Payer: Self-pay | Admitting: Emergency Medicine

## 2023-07-15 NOTE — Telephone Encounter (Signed)
Spoke with patient regarding prior message . Did advise patient that I will look for the forms that have been dropped off. We have received a fax from [employer] patient assistance program that patient will need to contact Social security number was given and patient's voice was understanding.   Nothing else further needed at this time.

## 2023-07-16 ENCOUNTER — Telehealth: Payer: Self-pay | Admitting: Emergency Medicine

## 2023-07-16 NOTE — Telephone Encounter (Signed)
Patient called stating when he was in the hospital 4 weeks ago they put him on 2L of 02 during the day. Patient states he talked to Dr. Delton Coombes about a portable oxygen concentrator that goes over his shoulders. Patient states he is not able to do the walk test due to his back. Wants to know what to do to get the portable oxygen.  Please advise and call patient back.

## 2023-07-19 ENCOUNTER — Ambulatory Visit (INDEPENDENT_AMBULATORY_CARE_PROVIDER_SITE_OTHER): Payer: Medicare Other | Admitting: Internal Medicine

## 2023-07-19 ENCOUNTER — Ambulatory Visit (INDEPENDENT_AMBULATORY_CARE_PROVIDER_SITE_OTHER): Payer: Medicare Other

## 2023-07-19 ENCOUNTER — Encounter: Payer: Self-pay | Admitting: Internal Medicine

## 2023-07-19 VITALS — BP 116/68 | HR 81 | Temp 98.1°F | Resp 16 | Ht 70.0 in | Wt 228.0 lb

## 2023-07-19 DIAGNOSIS — R052 Subacute cough: Secondary | ICD-10-CM

## 2023-07-19 DIAGNOSIS — Z950 Presence of cardiac pacemaker: Secondary | ICD-10-CM | POA: Diagnosis not present

## 2023-07-19 DIAGNOSIS — I700 Atherosclerosis of aorta: Secondary | ICD-10-CM | POA: Diagnosis not present

## 2023-07-19 DIAGNOSIS — R2232 Localized swelling, mass and lump, left upper limb: Secondary | ICD-10-CM | POA: Diagnosis not present

## 2023-07-19 DIAGNOSIS — I10 Essential (primary) hypertension: Secondary | ICD-10-CM

## 2023-07-19 DIAGNOSIS — R229 Localized swelling, mass and lump, unspecified: Secondary | ICD-10-CM | POA: Diagnosis not present

## 2023-07-19 DIAGNOSIS — Z23 Encounter for immunization: Secondary | ICD-10-CM

## 2023-07-19 DIAGNOSIS — R911 Solitary pulmonary nodule: Secondary | ICD-10-CM

## 2023-07-19 DIAGNOSIS — R059 Cough, unspecified: Secondary | ICD-10-CM | POA: Diagnosis not present

## 2023-07-19 MED ORDER — SHINGRIX 50 MCG/0.5ML IM SUSR
0.5000 mL | Freq: Once | INTRAMUSCULAR | 1 refills | Status: AC
Start: 2023-07-19 — End: 2023-07-19

## 2023-07-19 MED ORDER — BOOSTRIX 5-2.5-18.5 LF-MCG/0.5 IM SUSP
0.5000 mL | Freq: Once | INTRAMUSCULAR | 0 refills | Status: AC
Start: 2023-07-19 — End: 2023-07-19

## 2023-07-19 NOTE — Progress Notes (Unsigned)
Subjective:  Patient ID: Robert Church, male    DOB: 10/22/1956  Age: 67 y.o. MRN: [id number]  CC: Congestive Heart Failure and Cough   HPI Robert Church presents for f/up ----  Discussed the use of AI scribe software for clinical note transcription with the patient, who gave verbal consent to proceed.  History of Present Illness   The patient, with a history of chronic cough and recurrent pneumonia, presents with ongoing cough and shortness of breath. They report coughing up yellow sputum for the past three days, which is now lightening. They have been self-medicating with Levaquin, with one dose remaining. The patient experiences stomach pain as a side effect of the antibiotic but considers it manageable. They also report daily joint and muscle pain, which they manage with topical Icy Hot. They deny fever, night sweats, and chest tightness.  The patient has a history of daily inhaler use, including Dulera, Spiriva, Albuterol, and a nebulizer. They deny current smoking. Their last chest x-ray was reportedly conducted last year following a hospitalization for a head cold that progressed into a lower respiratory infection.  The patient also reports a hard lump on their arm that has been present since the previous year. They have consulted with a surgeon and a dermatologist, but no diagnosis or imaging has been conducted for this concern.  Lastly, the patient mentions a history of liver disease, identified on a previous imaging study. They express a desire for an ultrasound of the liver, which has not yet been conducted. They admit to daily consumption of two to three mixed drinks but deny any symptoms of liver disease.        ROS Review of Systems  Objective:  BP 116/68 (BP Location: Left Arm, Patient Position: Sitting, Cuff Size: Large)   Pulse 81   Temp 98.1 F (36.7 C) (Oral)   Resp 16   Ht 5\' 10"  (1.778 m)   Wt 228 lb (103.4 kg)   SpO2 90%   BMI 32.71 kg/m   BP Readings from  Last 3 Encounters:  07/19/23 116/68  07/08/23 132/89  07/06/23 107/74    Wt Readings from Last 3 Encounters:  07/19/23 228 lb (103.4 kg)  07/08/23 230 lb 3.2 oz (104.4 kg)  06/16/23 229 lb (103.9 kg)    Physical Exam Constitutional:      General: He is not in acute distress.    Appearance: He is ill-appearing. He is not toxic-appearing or diaphoretic.  HENT:     Nose: Nose normal.     Mouth/Throat:     Mouth: Mucous membranes are moist.  Eyes:     General: No scleral icterus.    Conjunctiva/sclera: Conjunctivae normal.  Cardiovascular:     Rate and Rhythm: Normal rate and regular rhythm.     Heart sounds: No murmur heard. Pulmonary:     Effort: Pulmonary effort is normal.     Breath sounds: No stridor. Examination of the right-upper field reveals rhonchi. Examination of the left-upper field reveals rhonchi. Examination of the right-middle field reveals rhonchi. Examination of the left-middle field reveals rhonchi. Examination of the right-lower field reveals rhonchi. Examination of the left-lower field reveals rhonchi. Rhonchi present. No decreased breath sounds, wheezing or rales.  Chest:     Chest wall: No tenderness.  Abdominal:     General: Abdomen is protuberant. Bowel sounds are normal. There is no distension.     Palpations: Abdomen is soft. There is no hepatomegaly, splenomegaly or mass.  Tenderness: There is no abdominal tenderness. There is no guarding.     Hernia: No hernia is present.  Musculoskeletal:        General: Normal range of motion.     Cervical back: Neck supple. No tenderness.     Right lower leg: No edema.     Left lower leg: No edema.  Lymphadenopathy:     Cervical: No cervical adenopathy.  Skin:    Findings: No rash.  Neurological:     General: No focal deficit present.     Mental Status: He is alert.  Psychiatric:        Mood and Affect: Mood normal.        Behavior: Behavior normal.     Lab Results  Component Value Date   WBC 9.3  06/02/2023   HGB 16.4 06/02/2023   HCT 49.1 06/02/2023   PLT 178 06/02/2023   GLUCOSE 111 (H) 06/16/2023   CHOL 221 (H) 03/17/2023   TRIG 196.0 (H) 03/17/2023   HDL 45.50 03/17/2023   LDLDIRECT 157.0 09/15/2022   LDLCALC 136 (H) 03/17/2023   ALT 20 04/05/2023   AST 20 04/05/2023   NA 137 06/16/2023   K 4.2 06/16/2023   CL 98 06/16/2023   CREATININE 0.92 06/16/2023   BUN 16 06/16/2023   CO2 28 06/16/2023   TSH 0.39 03/17/2023   PSA 3.24 06/29/2022   INR 1.0 04/05/2023   HGBA1C 6.0 05/27/2023   MICROALBUR <0.7 09/15/2022   No results found.   Assessment & Plan:  Atherosclerosis of aorta (HCC)  Solitary pulmonary nodule -     DG Chest 2 View; Future  Subacute cough -     DG Chest 2 View; Future  Subcutaneous mass of left forearm -     DG Forearm Left; Future  Need for prophylactic vaccination with combined diphtheria-tetanus-pertussis (DTP) vaccine -     Boostrix; Inject 0.5 mLs into the muscle once for 1 dose.  Dispense: 0.5 mL; Refill: 0  Need for prophylactic vaccination and inoculation against varicella -     Shingrix; Inject 0.5 mLs into the muscle once for 1 dose.  Dispense: 0.5 mL; Refill: 1     Follow-up: Return in about 3 months (around 10/19/2023).  Sanda Linger, MD

## 2023-07-19 NOTE — Patient Instructions (Signed)

## 2023-07-20 NOTE — Progress Notes (Signed)
Robert Church - 68 y.o. male MRN [id number]  Date of birth: 12/31/1955  Office Visit Note: Visit Date: 07/06/2023 PCP: [name], MD Referred by: [name], MD  Subjective: Chief Complaint  Patient presents with   Lower Back - Pain   HPI:  Robert Church is a 67 y.o. male who comes in today at the request of Ellin Goodie, FNP for planned Left L4-5 Lumbar Interlaminar epidural steroid injection with fluoroscopic guidance.  The patient has failed conservative care including home exercise, medications, time and activity modification.  This injection will be diagnostic and hopefully therapeutic.  Please see requesting physician notes for further details and justification.  This is essentially a repeat of the epidural injection performed in the past.  He seems to get relief with these for several months at the time although it is not great relief.  Review of his CT scan of his lumbar spine shows really minimal degenerative changes in his spine he actually has no stenosis or nerve compression.  He is a very sick individual with chronic congestive heart failure with swelling in the legs we had to cancel the last injection because he was admitted to the hospital with increased congestive heart failure.  He is complaining today also of his mid back pain which I think is more myofascial pain.  We have done cervical epidural in the past but I cannot see this now is being something we just repeating epidurals frequently.  I have asked him to come in as an office visit so we can address his upper back pain.   ROS Otherwise per HPI.  Assessment & Plan: Visit Diagnoses:    ICD-10-CM   1. Spinal stenosis of lumbar region with neurogenic claudication  M48.062 XR C-ARM NO REPORT    Epidural Steroid injection    methylPREDNISolone acetate (DEPO-MEDROL) injection 80 mg    2. Spondylosis without myelopathy or radiculopathy, lumbar region  M47.816       Plan: No additional findings.   Meds &  Orders:  Meds ordered this encounter  Medications   methylPREDNISolone acetate (DEPO-MEDROL) injection 80 mg    Orders Placed This Encounter  Procedures   XR C-ARM NO REPORT   Epidural Steroid injection    Follow-up: Return for visit to requesting provider as needed.   Procedures: No procedures performed  Lumbar Epidural Steroid Injection - Interlaminar Approach with Fluoroscopic Guidance  Patient: Robert Church      Date of Birth: 1956-12-13 MRN: [id number] PCP: [name], MD      Visit Date: 07/06/2023   Universal Protocol:     Consent Given By: the patient  Position: PRONE  Additional Comments: Vital signs were monitored before and after the procedure. Patient was prepped and draped in the usual sterile fashion. The correct patient, procedure, and site was verified.   Injection Procedure Details:   Procedure diagnoses: Spinal stenosis of lumbar region with neurogenic claudication [M48.062]   Meds Administered:  Meds ordered this encounter  Medications   methylPREDNISolone acetate (DEPO-MEDROL) injection 80 mg     Laterality: Left  Location/Site:  L4-5  Needle: 4.5 in., 20 ga. Tuohy  Needle Placement: Paramedian epidural  Findings:   -Comments: Excellent flow of contrast into the epidural space.  Procedure Details: Using a paramedian approach from the side mentioned above, the region overlying the inferior lamina was localized under fluoroscopic visualization and the soft tissues overlying this structure were infiltrated with 4 ml. of 1% Lidocaine  without Epinephrine. The Tuohy needle was inserted into the epidural space using a paramedian approach.   The epidural space was localized using loss of resistance along with counter oblique bi-planar fluoroscopic views.  After negative aspirate for air, blood, and CSF, a 2 ml. volume of Isovue-250 was injected into the epidural space and the flow of contrast was observed. Radiographs were obtained for  documentation purposes.    The injectate was administered into the level noted above.   Additional Comments:  No complications occurred Dressing: 2 x 2 sterile gauze and Band-Aid    Post-procedure details: Patient was observed during the procedure. Post-procedure instructions were reviewed.  Patient left the clinic in stable condition.   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 Bilateral foraminal stenosis additionally at this level that could affect either C6 nerve.   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 No apparent compressive stenosis at this level.   Chronic facet fusion on the right at C3-4. Right foraminal narrowing that could affect the right C4 nerve.   C4-5: Disc bulge and facet osteoarthritis worse on the right. Mild right foraminal stenosis.   C6-7: Bilateral foraminal narrowing of a moderate degree that could possibly be symptomatic.     Electronically Signed By: Paulina Fusi M.D. On: 06/03/2021 13:46 --------------------------------- CT of Lumbar Spine IMPRESSION: No apparent compressive central canal stenosis.   Mild non-compressive disc bulges L2-3 and L3-4.   Facet osteoarthritis at L4-5 left worse than right. Left foraminal narrowing due to encroachment by bulging disc material could possibly affect the left L4 nerve.   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 No apparent compressive stenosis of the canal or lateral recesses. Mild to moderate foraminal narrowing on the right could possibly affect the exiting L5 nerve.     Electronically Signed   By: Paulina Fusi M.D.   On: 06/03/2021 13:50     Objective:  VS:  HT:    WT:   BMI:     BP:107/74  HR:96bpm  TEMP: ( )  RESP:  Physical Exam Vitals and nursing note reviewed.   Constitutional:      General: He is not in acute distress.    Appearance: Normal appearance. He is obese. He is not ill-appearing.  HENT:     Head: Normocephalic and atraumatic.     Right Ear: External ear normal.     Left Ear: External ear normal.     Nose: No congestion.  Eyes:     Extraocular Movements: Extraocular movements intact.  Cardiovascular:     Rate and Rhythm: Normal rate.     Pulses: Normal pulses.  Pulmonary:     Effort: Pulmonary effort is normal. No respiratory distress.  Abdominal:     General: There is no distension.     Palpations: Abdomen is soft.  Musculoskeletal:        General: No tenderness or signs of injury.     Cervical back: Neck supple.     Right lower leg: Edema present.     Left lower leg: Edema present.     Comments: Patient has good distal strength without clonus.  Skin:    Findings: Erythema present. No rash.  Neurological:     General: No focal deficit present.     Mental Status: He is alert and oriented to person, place, and  time.     Sensory: No sensory deficit.     Motor: No weakness or abnormal muscle tone.     Coordination: Coordination normal.  Psychiatric:        Mood and Affect: Mood normal.        Behavior: Behavior normal.      Imaging: No results found.

## 2023-07-20 NOTE — Procedures (Signed)
Lumbar Epidural Steroid Injection - Interlaminar Approach with Fluoroscopic Guidance  Patient: Robert Church      Date of Birth: Nov 18, 1956 MRN: [id number] PCP: [name], MD      Visit Date: 07/06/2023   Universal Protocol:     Consent Given By: the patient  Position: PRONE  Additional Comments: Vital signs were monitored before and after the procedure. Patient was prepped and draped in the usual sterile fashion. The correct patient, procedure, and site was verified.   Injection Procedure Details:   Procedure diagnoses: Spinal stenosis of lumbar region with neurogenic claudication [M48.062]   Meds Administered:  Meds ordered this encounter  Medications   methylPREDNISolone acetate (DEPO-MEDROL) injection 80 mg     Laterality: Left  Location/Site:  L4-5  Needle: 4.5 in., 20 ga. Tuohy  Needle Placement: Paramedian epidural  Findings:   -Comments: Excellent flow of contrast into the epidural space.  Procedure Details: Using a paramedian approach from the side mentioned above, the region overlying the inferior lamina was localized under fluoroscopic visualization and the soft tissues overlying this structure were infiltrated with 4 ml. of 1% Lidocaine without Epinephrine. The Tuohy needle was inserted into the epidural space using a paramedian approach.   The epidural space was localized using loss of resistance along with counter oblique bi-planar fluoroscopic views.  After negative aspirate for air, blood, and CSF, a 2 ml. volume of Isovue-250 was injected into the epidural space and the flow of contrast was observed. Radiographs were obtained for documentation purposes.    The injectate was administered into the level noted above.   Additional Comments:  No complications occurred Dressing: 2 x 2 sterile gauze and Band-Aid    Post-procedure details: Patient was observed during the procedure. Post-procedure instructions were reviewed.  Patient left the  clinic in stable condition.

## 2023-07-21 NOTE — Telephone Encounter (Signed)
Spoke with patient and explained walk test and necessity. Questioned his daily walking ability and he seems to have no limitations other than back pain and neuropathy. He is not currently under pain management for back pain. He does not use any assistive devices such as [employer] technical [employer]. Explained again the necessity of the walk to assess his possible need for supplemental O2. Recommended he contact his insurance company to inquire if there are any other means of testing they would accept for POC approval. He will call them and see what they recommend. Patient is also insistent the hospital wanted him on 2L continuous, cannot locate this recommendation in inpatient notes/AVS. Also advised patient he may benefit from a sooner OV to discuss his options.   Please advise if patient should see you or can be scheduled with another provider/APP to discuss POC.  Thank you!

## 2023-07-21 NOTE — Telephone Encounter (Signed)
Dr. Delton Coombes please advise?  I'm not sure of an alternative approach here. From my understanding in order to get a poc machine patient will need to walked for insurance reasons. Patient is stating due to his back he is unable to walk

## 2023-07-21 NOTE — Telephone Encounter (Signed)
Unless he has been titrated for pulsed oxygen he cannot get a POC.  He would have to do a walk if it has not already been done on pulse flow device.

## 2023-07-22 NOTE — Telephone Encounter (Signed)
It would be okay for him to be seen sooner by an APP if that works best for him

## 2023-07-23 NOTE — Telephone Encounter (Signed)
Made pt. Apt for Sept. But also added him to wait list

## 2023-07-26 ENCOUNTER — Other Ambulatory Visit (HOSPITAL_COMMUNITY): Payer: Self-pay

## 2023-07-26 MED ORDER — METOLAZONE 2.5 MG PO TABS: 2.5000 mg | ORAL_TABLET | ORAL | 6 refills | Status: DC

## 2023-07-26 NOTE — Telephone Encounter (Signed)
Patient to get refill on his metolazone and schedule follow up with Dr. Leory Plowman.  Refill sent and follow up scheduled

## 2023-07-27 ENCOUNTER — Telehealth: Payer: Self-pay | Admitting: Emergency Medicine

## 2023-07-27 ENCOUNTER — Ambulatory Visit: Payer: Medicare Other | Admitting: Physical Medicine and Rehabilitation

## 2023-07-27 NOTE — Telephone Encounter (Signed)
I havent seen a CMN for this patient can the nurse check the providers box

## 2023-07-28 DIAGNOSIS — J9610 Chronic respiratory failure, unspecified whether with hypoxia or hypercapnia: Secondary | ICD-10-CM | POA: Diagnosis not present

## 2023-07-28 DIAGNOSIS — J449 Chronic obstructive pulmonary disease, unspecified: Secondary | ICD-10-CM | POA: Diagnosis not present

## 2023-07-28 NOTE — Telephone Encounter (Signed)
I have checked Dr. Kavin Leech B pod box and front mailbox.  Nothing on this patient.  I have sent a community message to [employer] requesting assistance and having another CMN faxed to office.   I have called patient and he is aware. I will leave message open so it can be followed up.

## 2023-07-29 DIAGNOSIS — J449 Chronic obstructive pulmonary disease, unspecified: Secondary | ICD-10-CM | POA: Diagnosis not present

## 2023-08-10 NOTE — Progress Notes (Deleted)
NEW PATIENT Date of Service/Encounter:  08/10/23 Referring provider: none-self referred Primary care provider: [name], MD  Subjective:  Robert Church is a 67 y.o. male with a PMHx of GERD, HTN, OSA, tobacco user, HLD, DM II, lumbar spondylosis, panlobar emphysema, atherosclerosis of aorta, GAD, acholic cirrhosis of liver, obesity, chronic systolic heart failure presenting today for evaluation of *** History obtained from: chart review and {Persons; PED relatives w/patient:19415::"patient"}.   Bee venom allergy:  ***  Penicillin allergy history:  ***  Chart Review:  Reviewed PCP notes from referral ***: ***  Other allergy screening: Asthma: {Blank single:19197::"yes","no"} Rhino conjunctivitis: {Blank single:19197::"yes","no"} Food allergy: {Blank single:19197::"yes","no"} Medication allergy: {Blank single:19197::"yes","no"} Hymenoptera allergy: {Blank single:19197::"yes","no"} Urticaria: {Blank single:19197::"yes","no"} Eczema:{Blank single:19197::"yes","no"} History of recurrent infections suggestive of immunodeficency: {Blank single:19197::"yes","no"} ***Vaccinations are up to date.   Past Medical History: 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   Requires supplemental oxygen    Medication List:  Current Outpatient Medications  Medication Sig Dispense Refill   albuterol (PROVENTIL) (2.5 MG/3ML) 0.083% nebulizer solution Take 3 mLs (2.5 mg total) by nebulization every 4 (four) hours as needed for wheezing  or shortness of breath. 75 mL 11   albuterol (VENTOLIN HFA) 108 (90 Base) MCG/ACT inhaler INHALE 2 PUFFS INTO THE LUNGS EVERY 6 HOURS AS NEEDED FOR WHEEZING 18 g 4   allopurinol (ZYLOPRIM) 300 MG tablet Take 1 tablet (300 mg total) by mouth daily. 90 tablet 1   ALPRAZolam (XANAX) 0.5 MG tablet Take 1 tablet (0.5 mg total) by mouth 3 (three) times daily. 270 tablet 0   AMBULATORY NON FORMULARY MEDICATION Medication Name: Nitroglycerin gel 0.125% apply 2-3 times daily to the anal canal. (Patient taking differently: Place 1 Application rectally daily as needed (Fissure). Medication Name: Nitroglycerin gel 0.125% apply 2-3 times daily to the anal canal.) 30 g 1   DALIRESP 500 MCG TABS tablet TAKE 1 TABLET(500 MCG) BY MOUTH DAILY 30 tablet 2   diclofenac Sodium (VOLTAREN) 1 % GEL Apply 1 Application topically in the morning and at bedtime.     EPINEPHrine 0.3 mg/0.3 mL IJ SOAJ injection Inject 0.3 mg into the muscle as needed for anaphylaxis. 1 each 0   fluticasone (FLONASE) 50 MCG/ACT nasal spray SHAKE LIQUID AND USE 2 SPRAYS IN EACH NOSTRIL DAILY (Patient taking differently: Place 1 spray into both nostrils daily as needed for allergies.) 16 g 2   guaiFENesin (MUCINEX) 600 MG 12 hr tablet Take 1 tablet (600 mg total) by mouth 2 (two) times daily. (Patient taking differently: Take 600 mg by mouth 2 (two) times daily as needed for cough or to loosen phlegm.) 60 tablet 5   ipratropium (ATROVENT) 0.06 % nasal spray Place 2 sprays into both nostrils 4 (four) times daily as needed for rhinitis. 45 mL 3   lidocaine (XYLOCAINE) 5 % ointment Apply topically 3 (three) times daily as needed. 35.44  g 1   Lidocaine, Anorectal, 5 % CREA Apply 1 application topically 2 (two) times daily. (Patient taking differently: Apply 1 application  topically daily as needed (Fissure).) 30 g 1   loratadine (CLARITIN) 10 MG tablet Take 10 mg by mouth daily.     losartan (COZAAR) 25 MG tablet Take 1 tablet (25 mg total) by mouth  daily. 90 tablet 3   metolazone (ZAROXOLYN) 2.5 MG tablet Take 1 tablet (2.5 mg total) by mouth as directed. 5 tablet 6   mometasone-formoterol (DULERA) 100-5 MCG/ACT AERO Inhale 2 puffs into the lungs 2 (two) times daily. 13 g 5   montelukast (SINGULAIR) 5 MG chewable tablet Chew 2 tablets (10 mg total) by mouth at bedtime. (Patient taking differently: Chew 10 mg by mouth at bedtime. As needed) 60 tablet 2   nicotine (NICODERM CQ - DOSED IN MG/24 HOURS) 14 mg/24hr patch Place 1 patch (14 mg total) onto the skin daily. 28 patch 0   nystatin (MYCOSTATIN) 100000 UNIT/ML suspension Take 5 mLs (500,000 Units total) by mouth in the morning, at noon, and at bedtime. 45 mL 0   omeprazole (PRILOSEC) 20 MG capsule TAKE 1 CAPSULE(20 MG) BY MOUTH TWICE DAILY BEFORE A MEAL FOR ACID REFLUX 180 capsule 0   potassium chloride SA (KLOR-CON M20) 20 MEQ tablet Take 1 tablet (20 mEq total) by mouth daily. Take an extra 40 meq when you take metolazone 90 tablet 3   Testosterone 20.25 MG/1.25GM (1.62%) GEL Place 1.25 g onto the skin daily. (Patient taking differently: Place 1 Pump onto the skin every other day.) 112.5 g 0   Tiotropium Bromide Monohydrate (SPIRIVA RESPIMAT) 2.5 MCG/ACT AERS Inhale 2 puffs into the lungs daily. 4 g 6   torsemide (DEMADEX) 20 MG tablet Take 2 tablets (40 mg total) by mouth 2 (two) times daily. 120 tablet 1   vitamin B-12 (CYANOCOBALAMIN) 1000 MCG tablet Take 1,000 mcg by mouth daily.     No current facility-administered medications for this visit.   Known Allergies:  Allergies  Allergen Reactions   Bee Venom Shortness Of Breath and Swelling   Cefdinir Other (See Comments)    Patient states he couldn't really breath    Spironolactone Anaphylaxis   Bactrim [Sulfamethoxazole-Trimethoprim] Other (See Comments)    Mouth burning   Daliresp [Roflumilast] Other (See Comments)    Dizzy, headache, leg pain per patient. 02/25/22. Tolerates in low doses    Jardiance [Empagliflozin] Nausea  Only and Other (See Comments)    Lightheadness Dizziness   Penicillins Swelling and Other (See Comments)    Childhood rxn--MD stated he "almost died" Has patient had a PCN reaction causing immediate rash, facial/tongue/throat swelling, SOB or lightheadedness with hypotension:Yes Has patient had a PCN reaction causing severe rash involving mucus membranes or skin necrosis:unsure Has patient had a PCN reaction that required hospitalization:unsure Has patient had a PCN reaction occurring within the last 10 years:No If all of the above answers are "NO", then may proceed with Cephalosporin use.     Clindamycin Other (See Comments)    Tongue swelling   Doxycycline Nausea And Vomiting    Severe stomach upset per patient   E-Mycin [Erythromycin Base] Swelling   Rosuvastatin Other (See Comments)    Myalgias (intolerance)   Past Surgical History: Past Surgical History:  Procedure Laterality Date   BIV UPGRADE N/A 10/07/2021   Procedure: BIV PPM UPGRADE;  Surgeon: Regan Lemming, MD;  Location: [facility];  Service: Cardiovascular;  Laterality: N/A;  BIV UPGRADE N/A 11/11/2022   Procedure: BIV PPM UPGRADE;  Surgeon: Regan Lemming, MD;  Location: [facility];  Service: Cardiovascular;  Laterality: N/A;   LEFT HEART CATH AND CORONARY ANGIOGRAPHY N/A 04/19/2017   Procedure: Left Heart Cath and Coronary Angiography;  Surgeon: Lyn Records, MD;  Location[facility] INVAS[facility] CV [facility];  Service: Cardiovascular;  Laterality: N/A;   PACEMAKER IMPLANT N/A 04/16/2021   Procedure: PACEMAKER IMPLANT;  Surgeon: Regan Lemming, MD;  Location: [facility] IN[facility]ASIVE CV [facility];  Service: Cardiovascular;  Laterality: N/A;   RIGHT HEART CATH N/A 03/09/2023   Procedure: RIGHT HEART CATH;  Surgeon: Dolores Patty, MD;  Location: [facility];  Service: Cardiovascular;  Laterality: N/A;   RIGHT/LEFT HEART CATH AND CORONARY ANGIOGRAPHY N/A 08/22/2021   Procedure: RIGHT/LEFT HEART CATH AND CORONARY  ANGIOGRAPHY;  Surgeon: Dolores Patty, MD;  Location: [facility]AS[facility];  Service: Cardiovascular;  Laterality: N/A;   TONSILLECTOMY     Family History: Family History  Problem Relation Age of Onset   Lung cancer Father    High blood pressure Mother    Diabetes Maternal Grandmother    Colon polyps Neg Hx    Esophageal cancer Neg Hx    Pancreatic cancer Neg Hx    Stomach cancer Neg Hx    Social History: [place] lives ***.   ROS:  All other systems negative except as noted per HPI.  Objective:  There were no vitals taken for this visit. There is no height or weight on file to calculate BMI. Physical Exam:  General Appearance:  Alert, cooperative, no distress, appears stated age  Head:  Normocephalic, without obvious abnormality, atraumatic  Eyes:  Conjunctiva clear, EOM's intact  Ears {Blank multiple:19196:a:"***","EACs normal bilaterally","normal TMs bilaterally","ear tubes present bilaterally without exudate"}  Nose: Nares normal, {Blank multiple:19196:a:"***","hypertrophic turbinates","normal mucosa","no visible anterior polyps","septum midline"}  Throat: Lips, tongue normal; teeth and gums normal, {Blank multiple:19196:a:"***","normal posterior oropharynx","tonsils 2+","tonsils 3+","no tonsillar exudate","+ cobblestoning","surgically absent tonsils","mildly erythematous posterior oropharynx"}  Neck: Supple, symmetrical  Lungs:   {Blank multiple:19196:a:"***","clear to auscultation bilaterally","end-expiratory wheezing","wheezing throughout"}, Respirations unlabored, {Blank multiple:19196:a:"***","no coughing","intermittent dry coughing","intermittent productive-sounding cough"}  Heart:  {Blank multiple:19196:a:"***","regular rate and rhythm","no murmur"}, Appears well perfused  Extremities: No edema  Skin: {Blank multiple:19196:a:"***","erythematous, dry patches scattered on ***","lichenification on ***","Skin color, texture, turgor normal","no rashes or lesions on visualized  portions of skin"}  Neurologic: No gross deficits   Diagnostics: Spirometry:  Tracings reviewed. His effort: {Blank single:19197::"Good reproducible efforts.","It was hard to get consistent efforts and there is a question as to whether this reflects a maximal maneuver.","Poor effort, data can not be interpreted.","Variable effort-results affected","effort okay for first attempt at spirometry.","Results not reproducible due to ***"} FVC: ***L (pre), ***L  (post) FEV1: ***L, ***% predicted (pre), ***L, ***% predicted (post) FEV1/FVC ratio: *** (pre), *** (post) Interpretation: {Blank single:19197::"Spirometry consistent with mild obstructive disease","Spirometry consistent with moderate obstructive disease","Spirometry consistent with severe obstructive disease","Spirometry consistent with possible restrictive disease","Spirometry consistent with mixed obstructive and restrictive disease","Spirometry uninterpretable due to technique","Spirometry consistent with normal pattern","No overt abnormalities noted given today's efforts","Nonobstructive ratio, low FEV1","Nonobstructive ratio, low FEV1, possible restriction"}.  Please see scanned spirometry results for details.  Skin Testing: {Blank single:19197::"Select foods","Environmental allergy panel","Environmental allergy panel and select foods","Food allergy panel","None","Deferred due to recent antihistamines use","deferred due to recent reaction","Pediatric Environmental Allergy Panel","Pediatric Food Panel","Select foods and environmental allergies"}. {Blank single:19197::"Adequate positive and negative controls","Inadequate positive control-testing invalid","Adequate positive and negative controls, dermatographism present, testing difficult to interpret"}. Results discussed  with patient/family.   {Blank single:19197::"Allergy testing results were read and interpreted by myself, documented by clinical staff.","Allergy testing results were read by  ***,FNP, documented by clinical staff"}  Labs:  Lab Orders  No laboratory test(s) ordered today     Assessment and Plan  ***  {Blank single:19197::"This note in its entirety was forwarded to the Provider who requested this consultation."}  Other: {Blank multiple:19196:a:"***","samples provided of: ***","school forms provided","reviewed spirometry technique","reviewed inhaler technique"}  Thank you for your kind referral. I appreciate the opportunity to take part in Emitt's care. Please do not hesitate to contact me with questions.***  Sincerely,  Tonny Bollman, MD [facility]

## 2023-08-11 ENCOUNTER — Telehealth: Payer: Self-pay | Admitting: Internal Medicine

## 2023-08-11 ENCOUNTER — Ambulatory Visit: Payer: Medicare Other | Admitting: Internal Medicine

## 2023-08-11 ENCOUNTER — Other Ambulatory Visit: Payer: Self-pay | Admitting: Internal Medicine

## 2023-08-11 DIAGNOSIS — R7989 Other specified abnormal findings of blood chemistry: Secondary | ICD-10-CM | POA: Insufficient documentation

## 2023-08-11 DIAGNOSIS — K7030 Alcoholic cirrhosis of liver without ascites: Secondary | ICD-10-CM

## 2023-08-11 NOTE — Telephone Encounter (Signed)
Pt stated that he can not have an MRI due to his pacemaker.

## 2023-08-11 NOTE — Telephone Encounter (Signed)
Patient called to find out if Dr. Yetta Barre had ordered the ultrasound on his liver that was discussed.  Patient states that his insurance does not require prior authorization.  Please call patient and advise.  [phone]

## 2023-08-11 NOTE — Telephone Encounter (Signed)
I think an MRI would be better Ordered

## 2023-08-12 ENCOUNTER — Other Ambulatory Visit: Payer: Self-pay | Admitting: Internal Medicine

## 2023-08-12 DIAGNOSIS — R7989 Other specified abnormal findings of blood chemistry: Secondary | ICD-10-CM

## 2023-08-12 DIAGNOSIS — K7030 Alcoholic cirrhosis of liver without ascites: Secondary | ICD-10-CM

## 2023-08-12 NOTE — Telephone Encounter (Signed)
Patient has appointment scheduled.

## 2023-08-12 NOTE — Telephone Encounter (Signed)
US ordered

## 2023-08-13 ENCOUNTER — Other Ambulatory Visit: Payer: Self-pay | Admitting: Physician Assistant

## 2023-08-13 DIAGNOSIS — M545 Low back pain, unspecified: Secondary | ICD-10-CM | POA: Diagnosis not present

## 2023-08-13 DIAGNOSIS — M25562 Pain in left knee: Secondary | ICD-10-CM | POA: Diagnosis not present

## 2023-08-13 DIAGNOSIS — M4800 Spinal stenosis, site unspecified: Secondary | ICD-10-CM

## 2023-08-13 DIAGNOSIS — M25561 Pain in right knee: Secondary | ICD-10-CM | POA: Diagnosis not present

## 2023-08-17 ENCOUNTER — Telehealth: Payer: Self-pay | Admitting: Physical Medicine and Rehabilitation

## 2023-08-17 ENCOUNTER — Ambulatory Visit (INDEPENDENT_AMBULATORY_CARE_PROVIDER_SITE_OTHER): Payer: Medicare Other

## 2023-08-17 DIAGNOSIS — I442 Atrioventricular block, complete: Secondary | ICD-10-CM

## 2023-08-17 LAB — CUP PACEART REMOTE DEVICE CHECK
Battery Remaining Longevity: 83 mo
Battery Remaining Percentage: 86 %
Battery Voltage: 2.99 V
Brady Statistic AP VP Percent: 1 %
Brady Statistic AP VS Percent: 1 %
Brady Statistic AS VP Percent: 98 %
Brady Statistic AS VS Percent: 1 %
Brady Statistic RA Percent Paced: 1 %
Date Time Interrogation Session: 20240827020016
Implantable Lead Connection Status: 753985
Implantable Lead Connection Status: 753985
Implantable Lead Connection Status: 753985
Implantable Lead Implant Date: 20220427
Implantable Lead Implant Date: 20220427
Implantable Lead Implant Date: 20231122
Implantable Lead Location: 753858
Implantable Lead Location: 753859
Implantable Lead Location: 753860
Implantable Pulse Generator Implant Date: 20231122
Lead Channel Impedance Value: 480 Ohm
Lead Channel Impedance Value: 490 Ohm
Lead Channel Impedance Value: 640 Ohm
Lead Channel Pacing Threshold Amplitude: 0.625 V
Lead Channel Pacing Threshold Amplitude: 0.625 V
Lead Channel Pacing Threshold Amplitude: 0.875 V
Lead Channel Pacing Threshold Pulse Width: 0.5 ms
Lead Channel Pacing Threshold Pulse Width: 0.5 ms
Lead Channel Pacing Threshold Pulse Width: 0.5 ms
Lead Channel Sensing Intrinsic Amplitude: 2.4 mV
Lead Channel Sensing Intrinsic Amplitude: 9.7 mV
Lead Channel Setting Pacing Amplitude: 1.625
Lead Channel Setting Pacing Amplitude: 2 V
Lead Channel Setting Pacing Amplitude: 2 V
Lead Channel Setting Pacing Pulse Width: 0.5 ms
Lead Channel Setting Pacing Pulse Width: 0.5 ms
Lead Channel Setting Sensing Sensitivity: 2 mV
Pulse Gen Model: 3222
Pulse Gen Serial Number: 3979797

## 2023-08-17 NOTE — Telephone Encounter (Signed)
Patient called wanting to schedule for another shot in his back. CB#[phone]

## 2023-08-18 ENCOUNTER — Encounter: Payer: Self-pay | Admitting: Emergency Medicine

## 2023-08-18 ENCOUNTER — Telehealth: Payer: Self-pay

## 2023-08-18 ENCOUNTER — Ambulatory Visit: Payer: Medicare Other | Admitting: Emergency Medicine

## 2023-08-18 VITALS — BP 124/64 | HR 80 | Temp 98.0°F | Ht 70.0 in | Wt 232.4 lb

## 2023-08-18 DIAGNOSIS — J302 Other seasonal allergic rhinitis: Secondary | ICD-10-CM

## 2023-08-18 DIAGNOSIS — K2100 Gastro-esophageal reflux disease with esophagitis, without bleeding: Secondary | ICD-10-CM | POA: Diagnosis not present

## 2023-08-18 DIAGNOSIS — J431 Panlobular emphysema: Secondary | ICD-10-CM

## 2023-08-18 DIAGNOSIS — J9611 Chronic respiratory failure with hypoxia: Secondary | ICD-10-CM

## 2023-08-18 DIAGNOSIS — F17210 Nicotine dependence, cigarettes, uncomplicated: Secondary | ICD-10-CM

## 2023-08-18 MED ORDER — ZAFIRLUKAST 10 MG PO TABS: 10.0000 mg | ORAL_TABLET | Freq: Two times a day (BID) | ORAL | 11 refills | Status: DC

## 2023-08-18 NOTE — Telephone Encounter (Signed)
Pt called in wanting someone to go over his transmission with him. He doesn't understand the message

## 2023-08-18 NOTE — Patient Instructions (Addendum)
Please continue Dulera 2 puffs twice a day.  Rinse and gargle after using. Continue Spiriva once daily. Keep your albuterol available to use either 2 puffs or 1 nebulizer treatment up to every 4 hours if needed for shortness of breath, chest tightness, wheezing. Agree with stopping Singulair (montelukast).  We will put this on her allergy list We will try starting zafirlukast twice a day.  If you develop any mouth discomfort, swelling with this medication then we will have to stop it as well. Continue prednisone 10 mg once daily Continue your Daliresp as you have been taking it Recommend a flu shot this fall Recommend considering getting the COVID-19 vaccine this fall Continue fluticasone and Atrovent nasal sprays Continue your omeprazole Try to work on decreasing your smoking.  Ultimate goal is to stop altogether Please continue your Trilogy ventilator each night while sleeping.  We will look for orders from your DME company to consider having this repaired or replaced. Follow-up with APP in 3 months Follow with Dr Delton Coombes in 6 months or sooner if you have any problems

## 2023-08-18 NOTE — Progress Notes (Signed)
Subjective:  Patient ID: Robert Church, male    DOB: [birth year]  Age: 67 y.o. MRN: [id number]  CC:  Chief Complaint  Patient presents with   Follow-up    Increased SOB since June 2024. He has had sinus infection since then and has prod cough with yellow sputum. He has occ wheezing.    HPI  ROV 05/19/23 -follow-up visit for 67 year old man multifactorial chronic hypoxemic respiratory failure.  He has an ischemic cardiomyopathy and third-degree heart block with a pacer, is followed at the advanced heart failure clinic.  He also has obesity, severe COPD.  He still smokes.  He is on a nocturnal trilogy device.  Maintenances Spiriva, Dulera, montelukast, prednisone 10 mg, Daliresp, fluticasone and Atrovent nasal sprays, omeprazole twice daily.  He is having download issues with his trilogy, also the battery backup.  Working with his DME. LLE cellulitis about 1 month ago, then pain. Was started on bactrim in the ED, caused side effects of stomatitis, mouth burning. Then changed to cefdinir > caused him to have dyspnea, again took benadryl. Then tried on levaquin, completed full course. He is still having LLE pain and some edema. Since he took the bactrim, he has been unable to the montelukast, difficulty swallowing it.   ROV 08/18/2023 -- [name] is 87 with multifactorial chronic hypoxemic respiratory failure.  He has severe COPD, continues to smoke.  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  He has been managed on a trilogy ventilator.  Maintenance regimen Spiriva and Dulera, prednisone 10 mg, Daliresp, fluticasone and Atrovent nasal spray, omeprazole twice daily. He stopped Singulair after he experienced throat swallowing after taking it.  He reports today that his Trilogy has been leaving a white powder, ? Whether he needs a new one. He was hospitalized in June for volume  overload, cellulitis.  He is still having congestion, yellow drainage. Still smoking some every day.    Objective:   Today's Vitals: Vitals:   08/18/23 1101  BP: 124/64  Pulse: 80  Temp: 98 F (36.7 C)  TempSrc: Oral  SpO2: 91%  Weight: 232 lb 6.4 oz (105.4 kg)  Height: 5\' 10"  (1.778 m)    Gen: Pleasant, well-nourished, in no distress,  normal affect  ENT: No lesions,  mouth clear,  oropharynx clear, no postnasal drip  Neck: No JVD, no stridor  Lungs: No use of accessory muscles, distant, no crackles, few B exp wheezes w cough.   Cardiovascular: RRR, heart sounds normal, no murmur or gallops, no peripheral edema  Musculoskeletal: No deformities, no cyanosis or clubbing  Neuro: alert, awake, non focal  Skin: Patchy chronic erythema on his left calf with slight warmth.  No purulence.  No drainage.   Assessment & Plan:  COPD (chronic obstructive pulmonary disease) (HCC) Severe COPD with chronic bronchitic symptoms and a gentleman who continues to smoke.  He has hypoxemic respiratory failure and requires a Trilogy ventilator.  He had been on montelukast, question whether he has allergic reaction to this versus whether he had difficulty swallowing it due to a reaction to Bactrim.  It sounds like it was the montelukast itself because he tried different formulations.  We will stop it, see if we can replace it with zafirlukast.  I considered putting him on daily scheduled azithromycin in addition to his prednisone but he has an allergy to erythromycin so we will defer.   Please  continue Dulera 2 puffs twice a day.  Rinse and gargle after using. Continue Spiriva once daily. Keep your albuterol available to use either 2 puffs or 1 nebulizer treatment up to every 4 hours if needed for shortness of breath, chest tightness, wheezing. Agree with stopping Singulair (montelukast).  We will put this on your allergy list We will try starting zafirlukast twice a day.  If you develop any  mouth discomfort, swelling with this medication then we will have to stop it as well. Continue prednisone 10 mg once daily Continue your Daliresp as you have been taking it Recommend a flu shot this fall Recommend considering getting the COVID-19 vaccine this fall Follow-up with APP in 3 months Follow with Dr Delton Coombes in 6 months or sooner if you have any problems  Chronic respiratory failure with hypoxia (HCC) Please continue your Trilogy ventilator each night while sleeping.  We will look for orders from your DME company to consider having this repaired or replaced.  Seasonal allergic rhinitis Trying to substitute zafirlukast as above Continue fluticasone and Atrovent nasal sprays  GERD (gastroesophageal reflux disease) On omeprazole  Cigarette smoker Try to work on decreasing your smoking.  Ultimate goal is to stop altogether      Levy Pupa, MD, PhD 08/18/2023, 1:02 PM [facility] [phone] or if no answer [phone]

## 2023-08-18 NOTE — Telephone Encounter (Signed)
LVM for patient to call device clinic back.

## 2023-08-18 NOTE — Assessment & Plan Note (Signed)
On omeprazole.  

## 2023-08-18 NOTE — Assessment & Plan Note (Signed)
Trying to substitute zafirlukast as above Continue fluticasone and Atrovent nasal sprays

## 2023-08-18 NOTE — Assessment & Plan Note (Signed)
Please continue your Trilogy ventilator each night while sleeping.  We will look for orders from your DME company to consider having this repaired or replaced.

## 2023-08-18 NOTE — Assessment & Plan Note (Signed)
Try to work on decreasing your smoking.  Ultimate goal is to stop altogether

## 2023-08-18 NOTE — Telephone Encounter (Signed)
Spoke with patient informed him that his transmission was normal.

## 2023-08-18 NOTE — Assessment & Plan Note (Signed)
Severe COPD with chronic bronchitic symptoms and a gentleman who continues to smoke.  He has hypoxemic respiratory failure and requires a Trilogy ventilator.  He had been on montelukast, question whether he has allergic reaction to this versus whether he had difficulty swallowing it due to a reaction to Bactrim.  It sounds like it was the montelukast itself because he tried different formulations.  We will stop it, see if we can replace it with zafirlukast.  I considered putting him on daily scheduled azithromycin in addition to his prednisone but he has an allergy to erythromycin so we will defer.   Please continue Dulera 2 puffs twice a day.  Rinse and gargle after using. Continue Spiriva once daily. Keep your albuterol available to use either 2 puffs or 1 nebulizer treatment up to every 4 hours if needed for shortness of breath, chest tightness, wheezing. Agree with stopping Singulair (montelukast).  We will put this on your allergy list We will try starting zafirlukast twice a day.  If you develop any mouth discomfort, swelling with this medication then we will have to stop it as well. Continue prednisone 10 mg once daily Continue your Daliresp as you have been taking it Recommend a flu shot this fall Recommend considering getting the COVID-19 vaccine this fall Follow-up with APP in 3 months Follow with Dr Delton Coombes in 6 months or sooner if you have any problems

## 2023-08-19 NOTE — Telephone Encounter (Signed)
Called and scheduled the appt.

## 2023-08-20 ENCOUNTER — Ambulatory Visit
Admission: RE | Admit: 2023-08-20 | Discharge: 2023-08-20 | Disposition: A | Discharge status: Home / Self Care | Payer: Medicare Other | Source: Ambulatory Visit | Attending: Internal Medicine | Admitting: Internal Medicine

## 2023-08-20 DIAGNOSIS — R7989 Other specified abnormal findings of blood chemistry: Secondary | ICD-10-CM

## 2023-08-20 DIAGNOSIS — K7030 Alcoholic cirrhosis of liver without ascites: Secondary | ICD-10-CM

## 2023-08-20 DIAGNOSIS — K769 Liver disease, unspecified: Secondary | ICD-10-CM | POA: Diagnosis not present

## 2023-08-24 ENCOUNTER — Ambulatory Visit: Payer: Medicare Other | Admitting: Physical Medicine and Rehabilitation

## 2023-08-24 ENCOUNTER — Telehealth: Payer: Self-pay | Admitting: Physical Medicine and Rehabilitation

## 2023-08-24 NOTE — Telephone Encounter (Signed)
Patient has no showed office visits multiple times. He is currently being managed at [facility], if he calls, need to ask if he is being treated for back pain there.

## 2023-08-25 DIAGNOSIS — J9611 Chronic respiratory failure with hypoxia: Secondary | ICD-10-CM | POA: Diagnosis not present

## 2023-08-26 ENCOUNTER — Ambulatory Visit: Payer: Medicare Other | Admitting: Adult Health

## 2023-08-28 DIAGNOSIS — J9610 Chronic respiratory failure, unspecified whether with hypoxia or hypercapnia: Secondary | ICD-10-CM | POA: Diagnosis not present

## 2023-08-28 DIAGNOSIS — J449 Chronic obstructive pulmonary disease, unspecified: Secondary | ICD-10-CM | POA: Diagnosis not present

## 2023-08-29 DIAGNOSIS — J449 Chronic obstructive pulmonary disease, unspecified: Secondary | ICD-10-CM | POA: Diagnosis not present

## 2023-08-30 ENCOUNTER — Ambulatory Visit
Admission: RE | Admit: 2023-08-30 | Discharge: 2023-08-30 | Disposition: A | Discharge status: Home / Self Care | Payer: Medicare Other | Source: Ambulatory Visit | Attending: Physician Assistant | Admitting: Physician Assistant

## 2023-08-30 DIAGNOSIS — M545 Low back pain, unspecified: Secondary | ICD-10-CM | POA: Diagnosis not present

## 2023-08-30 DIAGNOSIS — M4800 Spinal stenosis, site unspecified: Secondary | ICD-10-CM

## 2023-08-30 NOTE — Progress Notes (Signed)
Remote pacemaker transmission.   

## 2023-08-31 ENCOUNTER — Ambulatory Visit: Payer: Medicare Other | Admitting: Cardiovascular Disease

## 2023-09-02 NOTE — Telephone Encounter (Signed)
PT calling again frustrated because Melissa from [employer] has sent several Fax's to switch PT from a CPAP to a BIPAP.   Is this in Process please? PT is elevated.   His # is [phone]

## 2023-09-03 NOTE — Telephone Encounter (Signed)
I spoke with the patient.  He needs a nocturnal ventilator, same settings that he had before.  His Trilogy device is not working.  It sounds like it would be a similar ventilator, not specifically a trilogy made by [employer].   He does not need a CPAP or a BiPAP, he needs a specific order for NIPPV with which ever home device adapter can substitute.  Please discuss this with Melissa at [employer] so we can get the right order placed, could also use his personal DME rep Misty Stanley [phone] as a resource..  Thank you

## 2023-09-03 NOTE — Telephone Encounter (Signed)
I am unsure what his settings prior to this was. Can we please specify what the setting to this ventilator should be

## 2023-09-03 NOTE — Telephone Encounter (Signed)
Can we send in dme order for Bipap?

## 2023-09-05 ENCOUNTER — Other Ambulatory Visit: Payer: Self-pay | Admitting: Internal Medicine

## 2023-09-05 DIAGNOSIS — K2100 Gastro-esophageal reflux disease with esophagitis, without bleeding: Secondary | ICD-10-CM

## 2023-09-06 ENCOUNTER — Telehealth: Payer: Self-pay | Admitting: Internal Medicine

## 2023-09-06 ENCOUNTER — Other Ambulatory Visit: Payer: Self-pay | Admitting: Emergency Medicine

## 2023-09-06 NOTE — Telephone Encounter (Signed)
Patient said he has had a sinus infection for 2 months. He thinks he may have had mold exposure due to a roof leak in his home. He wanted to know if he could have blood work or imaging done to determine if that is the case. Patient did not want to schedule an appointment unless advised by doctor. Patient would like a call back at [phone].

## 2023-09-07 ENCOUNTER — Telehealth: Payer: Self-pay | Admitting: Emergency Medicine

## 2023-09-07 DIAGNOSIS — R0981 Nasal congestion: Secondary | ICD-10-CM | POA: Diagnosis not present

## 2023-09-07 DIAGNOSIS — R062 Wheezing: Secondary | ICD-10-CM | POA: Diagnosis not present

## 2023-09-07 DIAGNOSIS — R051 Acute cough: Secondary | ICD-10-CM | POA: Diagnosis not present

## 2023-09-07 DIAGNOSIS — J208 Acute bronchitis due to other specified organisms: Secondary | ICD-10-CM | POA: Diagnosis not present

## 2023-09-07 MED ORDER — OMEPRAZOLE 20 MG PO CPDR
DELAYED_RELEASE_CAPSULE | ORAL | 0 refills | Status: DC
Start: 2023-09-07 — End: 2023-12-02

## 2023-09-07 NOTE — Telephone Encounter (Signed)
I do not see any orders that have been placed by Dr Delton Coombes

## 2023-09-07 NOTE — Telephone Encounter (Signed)
Pt calling back about the ventilator as well as getting nebulizer prescription

## 2023-09-07 NOTE — Telephone Encounter (Signed)
Pt calling in from [employer] stating he is needing a form signed ventilator

## 2023-09-07 NOTE — Telephone Encounter (Signed)
Robert Church from [employer] is calling for a form to be signed for a vociventolator. She has sent over the prescription form she needs it to be signed and sent back.

## 2023-09-08 DIAGNOSIS — R001 Bradycardia, unspecified: Secondary | ICD-10-CM | POA: Diagnosis not present

## 2023-09-08 DIAGNOSIS — R062 Wheezing: Secondary | ICD-10-CM | POA: Diagnosis not present

## 2023-09-08 DIAGNOSIS — J449 Chronic obstructive pulmonary disease, unspecified: Secondary | ICD-10-CM | POA: Diagnosis not present

## 2023-09-09 ENCOUNTER — Encounter: Payer: Self-pay | Admitting: Emergency Medicine

## 2023-09-09 ENCOUNTER — Telehealth: Payer: Self-pay | Admitting: Emergency Medicine

## 2023-09-09 MED ORDER — IPRATROPIUM-ALBUTEROL 0.5-2.5 (3) MG/3ML IN SOLN: 3.0000 mL | RESPIRATORY_TRACT | 5 refills | Status: DC | PRN

## 2023-09-09 NOTE — Telephone Encounter (Signed)
Patient is calling because he needs a duoneb solution.

## 2023-09-09 NOTE — Telephone Encounter (Signed)
I just filled out the order form for him, sent to [facility].

## 2023-09-09 NOTE — Telephone Encounter (Signed)
Dr. Delton Coombes have you received this form ?

## 2023-09-09 NOTE — Telephone Encounter (Signed)
Pt requesting duoneb please advise

## 2023-09-09 NOTE — Telephone Encounter (Signed)
Duoneb sent to the pharmacy. Pt aware.

## 2023-09-09 NOTE — Telephone Encounter (Signed)
Pt calling back about the ventilator as well as getting nebulizer prescription, states he was told Dr. Delton Coombes will sign off on it making it his priority.

## 2023-09-09 NOTE — Telephone Encounter (Signed)
I was able to find settings and reorder

## 2023-09-09 NOTE — Telephone Encounter (Signed)
Okay to give him prescription for DuoNeb every 6 hours as needed for shortness of breath, chest tightness, wheezing.

## 2023-09-10 NOTE — Telephone Encounter (Signed)
September 09, 2023 Glynda Jaeger, [place]     09/09/23  2:04 PM Note Duoneb sent to the pharmacy. Pt aware.       Nothing further needed.

## 2023-09-16 ENCOUNTER — Encounter: Payer: Self-pay | Admitting: Adult Health

## 2023-09-16 ENCOUNTER — Telehealth: Payer: Self-pay | Admitting: *Deleted

## 2023-09-16 ENCOUNTER — Ambulatory Visit: Payer: Medicare Other | Admitting: Adult Health

## 2023-09-16 ENCOUNTER — Telehealth: Payer: Self-pay | Admitting: Adult Health

## 2023-09-16 VITALS — BP 110/70 | HR 86 | Temp 98.3°F | Ht 70.0 in | Wt 234.0 lb

## 2023-09-16 DIAGNOSIS — J9611 Chronic respiratory failure with hypoxia: Secondary | ICD-10-CM

## 2023-09-16 DIAGNOSIS — G4733 Obstructive sleep apnea (adult) (pediatric): Secondary | ICD-10-CM

## 2023-09-16 DIAGNOSIS — J449 Chronic obstructive pulmonary disease, unspecified: Secondary | ICD-10-CM | POA: Diagnosis not present

## 2023-09-16 DIAGNOSIS — R5381 Other malaise: Secondary | ICD-10-CM | POA: Insufficient documentation

## 2023-09-16 DIAGNOSIS — J431 Panlobular emphysema: Secondary | ICD-10-CM

## 2023-09-16 DIAGNOSIS — J441 Chronic obstructive pulmonary disease with (acute) exacerbation: Secondary | ICD-10-CM

## 2023-09-16 DIAGNOSIS — I5042 Chronic combined systolic (congestive) and diastolic (congestive) heart failure: Secondary | ICD-10-CM

## 2023-09-16 MED ORDER — PREDNISONE 20 MG PO TABS: 20.0000 mg | ORAL_TABLET | Freq: Every day | ORAL | 0 refills | Status: DC

## 2023-09-16 MED ORDER — FLUTICASONE-SALMETEROL 115-21 MCG/ACT IN AERO: 2.0000 | INHALATION_SPRAY | Freq: Two times a day (BID) | RESPIRATORY_TRACT | 12 refills | Status: DC

## 2023-09-16 MED ORDER — SPIRIVA RESPIMAT 2.5 MCG/ACT IN AERS: 2.0000 | INHALATION_SPRAY | Freq: Every day | RESPIRATORY_TRACT | Status: DC

## 2023-09-16 NOTE — Assessment & Plan Note (Signed)
Continue on oxygen with activity and at bedtime with NIV to maintain O2 saturations greater than 88 to 90%

## 2023-09-16 NOTE — Assessment & Plan Note (Signed)
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  Patient is continue on nocturnal vent.  Download has been requested.  Plan  Patient Instructions  Mucinex DM Twice daily  As needed  cough/congestion  Fluids and rest.  Tylenol As needed   Stop Dulera begin Advair 115 2 puffs Twice daily, rinse after use.  Continue Spiriva 2 puffs daily/  Continue Daliresp daily .  Increase Prednisone 20mg  daily for 5 days and then Prednisone  10mg  daily.   Delsym 2 tsp Twice daily  for cough As needed   Tessalon Three times a day  for cough As needed   Continue on Claritin 10mg  daily.  Albuterol inhaler or Duoneb As needed   Continue on Oxygen 2l/m as needed to keep Oxygen Levels >88-90%.  Continue on Trilogy vent /BIPAP At bedtime  with Oxygen 3 l/m  BIPAP /NIV download  Flutter valve Twice daily   Order for home health evaluation for services.  Follow up with Dr. Delton Coombes or Tomaz Janis NP in 2 months  and As needed   Please contact office for sooner follow up if symptoms do not improve or worsen or seek emergency care

## 2023-09-16 NOTE — Progress Notes (Signed)
@Patient  ID: Robert Church, male    DOB: 15-Dec-1956, 67 y.o.   MRN: [id number]  Chief Complaint  Patient presents with   Follow-up    Referring provider: [name], MD  HPI: 68 year old male former smoker followed for severe COPD, chronic hypoxic and hypercarbic respiratory failure and sleep apnea Medical history significant for CHF     TEST/EVENTS :  Low-dose CT screening CT March 14, 2021 showed lung RADS 2 benign appearance, mild emphysema, mild scarring at the left upper lobe, 4.7 mm right middle lobe nodule and a 5.8 mm right lower lobe nodule unchanged from 2019  CT chest March 17, 2023 moderate emphysema, smoking-related respiratory bronchiolitis, lung RADS 2  Chest x-ray July 19, 2023 no acute process   Spirometry December 2016 showed moderate restriction with an FEV1 at 69%, ratio 75, FVC 72%, decreased mid flows  Echo 05/2023 EF 35-40%, LV severe dilation,   RHC 02/2023 Mid PAH , mild reduced cardiac output    09/16/2023 Follow up : COPD, O2 R , OSA Patient returns for a follow-up visit.  Patient has underlying severe COPD and chronic respiratory failure on oxygen with activity and at bedtime.  Patient has underlying sleep apnea and hypercarbic respiratory failure is on nocturnal trilogy vent.  Patient says recently has gotten a new machine.  We have requested a download today. Patient said he recently had a COPD flare.  Took Levaquin 500 mg half a tablet twice daily for 5 days.  He finished this 2 days ago.  States his congestion has gotten better but he continues to have some intermittent wheezing and shortness of breath.  He remains on Dulera twice daily and Spiriva.  Patient says he needs to switch his [name] to Advair due to insurance requirements.  And is requesting a prescription.  He is on Daliresp daily.  Says he is currently in the donut hole which makes it harder for him to afford his medications.  Patient says he continues to have shortness of breath with  minimal activities.  Patient lives alone.  He is able to drive.  But has trouble getting all of his activities done at home.  Says he needs some help at home.  He continues with flutter valve twice daily.  He is currently on prednisone 10 mg daily. He denies any hemoptysis, chest pain.  Says he did skip his diuretic today legs are more swollen than yesterday.  Says he cannot take the diuretic when he leaves the home.     Allergies  Allergen Reactions   Bee Venom Shortness Of Breath and Swelling   Cefdinir Other (See Comments)    Patient states he couldn't really breath    Spironolactone Anaphylaxis   Bactrim [Sulfamethoxazole-Trimethoprim] Other (See Comments)    Mouth burning   Daliresp [Roflumilast] Other (See Comments)    Dizzy, headache, leg pain per patient. 02/25/22. Tolerates in low doses    Jardiance [Empagliflozin] Nausea Only and Other (See Comments)    Lightheadness Dizziness   Penicillins Swelling and Other (See Comments)    Childhood rxn--MD stated he "almost died" Has patient had a PCN reaction causing immediate rash, facial/tongue/throat swelling, SOB or lightheadedness with hypotension:Yes Has patient had a PCN reaction causing severe rash involving mucus membranes or skin necrosis:unsure Has patient had a PCN reaction that required hospitalization:unsure Has patient had a PCN reaction occurring within the last 10 years:No If all of the above answers are "NO", then may proceed with Cephalosporin use.  Singulair [Montelukast] Swelling   Clindamycin Other (See Comments)    Tongue swelling   Doxycycline Nausea And Vomiting    Severe stomach upset per patient   E-Mycin [Erythromycin Base] Swelling   Rosuvastatin Other (See Comments)    Myalgias (intolerance)    Immunization History  Administered Date(s) Administered   Influenza,inj,Quad PF,6+ Mos 09/17/2021   PNEUMOCOCCAL CONJUGATE-20 08/20/2021   Tdap 12/03/2011    Past Medical History:  Diagnosis Date    Adenomatous colon polyp    Allergy    Anal fissure    Arthritis    Asthma    Bronchitis    CHF (congestive heart failure) (HCC)    COPD, group D, by GOLD 2017 classification (HCC)    Emphysema lung (HCC)    GERD (gastroesophageal reflux disease)    History of hiatal hernia    Hyperlipidemia    Hypertension    NICM (nonischemic cardiomyopathy) (HCC)    OSA (obstructive sleep apnea)    Pneumonia    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   Requires supplemental oxygen     Tobacco History: Social History   Tobacco Use  Smoking Status Former   Current packs/day: 0.00   Average packs/day: 2.0 packs/day for 46.0 years (92.0 ttl pk-yrs)   Types: Cigarettes   Start date: 01/28/1976   Quit date: 01/27/2022   Years since quitting: 1.6   Passive exposure: Past  Smokeless Tobacco Never  Tobacco Comments   Chews on a cigar.  09/16/2023 hfb   Counseling given: Not Answered Tobacco comments: Chews on a cigar.  09/16/2023 hfb   Outpatient Medications Prior to Visit  Medication Sig Dispense Refill   albuterol (PROVENTIL) (2.5 MG/3ML) 0.083% nebulizer solution Take 3 mLs (2.5 mg total) by nebulization every 4 (four) hours as needed for wheezing or shortness of breath. 75 mL 11   allopurinol (ZYLOPRIM) 300 MG tablet Take 1 tablet (300 mg total) by mouth daily. 90 tablet 1   ALPRAZolam (XANAX) 0.5 MG tablet Take 1 tablet (0.5 mg total) by mouth 3 (three) times daily. 270 tablet 0   AMBULATORY NON FORMULARY MEDICATION Medication Name: Nitroglycerin gel 0.125% apply 2-3 times daily to the anal canal. (Patient taking differently: Place 1 Application rectally daily as needed (Fissure). Medication Name: Nitroglycerin gel 0.125% apply 2-3 times daily to the anal canal.) 30 g 1   DALIRESP 500 MCG TABS tablet TAKE 1 TABLET(500 MCG) BY MOUTH DAILY 30 tablet 2   diclofenac Sodium (VOLTAREN) 1 % GEL Apply 1 Application topically in the morning and at bedtime.     EPINEPHrine 0.3 mg/0.3 mL IJ  SOAJ injection Inject 0.3 mg into the muscle as needed for anaphylaxis. 1 each 0   fluticasone (FLONASE) 50 MCG/ACT nasal spray SHAKE LIQUID AND USE 2 SPRAYS IN EACH NOSTRIL DAILY (Patient taking differently: Place 1 spray into both nostrils daily as needed for allergies.) 16 g 2   guaiFENesin (MUCINEX) 600 MG 12 hr tablet Take 1 tablet (600 mg total) by mouth 2 (two) times daily. (Patient taking differently: Take 600 mg by mouth 2 (two) times daily as needed for cough or to loosen phlegm.) 60 tablet 5   ipratropium (ATROVENT) 0.06 % nasal spray Place 2 sprays into both nostrils 4 (four) times daily as needed for rhinitis. 45 mL 3   ipratropium-albuterol (DUONEB) 0.5-2.5 (3) MG/3ML SOLN Take 3 mLs by nebulization every 4 (four) hours as needed. 360 mL 5   lidocaine (XYLOCAINE) 5 %  ointment Apply topically 3 (three) times daily as needed. 35.44 g 1   Lidocaine, Anorectal, 5 % CREA Apply 1 application topically 2 (two) times daily. (Patient taking differently: Apply 1 application  topically daily as needed (Fissure).) 30 g 1   loratadine (CLARITIN) 10 MG tablet Take 10 mg by mouth daily.     losartan (COZAAR) 25 MG tablet Take 1 tablet (25 mg total) by mouth daily. 90 tablet 3   metolazone (ZAROXOLYN) 2.5 MG tablet Take 1 tablet (2.5 mg total) by mouth as directed. 5 tablet 6   nicotine (NICODERM CQ - DOSED IN MG/24 HOURS) 14 mg/24hr patch Place 1 patch (14 mg total) onto the skin daily. 28 patch 0   nystatin (MYCOSTATIN) 100000 UNIT/ML suspension Take 5 mLs (500,000 Units total) by mouth in the morning, at noon, and at bedtime. 45 mL 0   omeprazole (PRILOSEC) 20 MG capsule TAKE 1 CAPSULE(20 MG) BY MOUTH TWICE DAILY BEFORE A MEAL FOR ACID REFLUX 180 capsule 0   potassium chloride SA (KLOR-CON M20) 20 MEQ tablet Take 1 tablet (20 mEq total) by mouth daily. Take an extra 40 meq when you take metolazone 90 tablet 3   Testosterone 20.25 MG/1.25GM (1.62%) GEL Place 1.25 g onto the skin daily. (Patient taking  differently: Place 1 Pump onto the skin every other day.) 112.5 g 0   Tiotropium Bromide Monohydrate (SPIRIVA RESPIMAT) 2.5 MCG/ACT AERS Inhale 2 puffs into the lungs daily. 4 g 6   VENTOLIN HFA 108 (90 Base) MCG/ACT inhaler INHALE 2 PUFFS INTO THE LUNGS EVERY 6 HOURS AS NEEDED FOR WHEEZING 18 g 4   vitamin B-12 (CYANOCOBALAMIN) 1000 MCG tablet Take 1,000 mcg by mouth daily.     zafirlukast (ACCOLATE) 10 MG tablet Take 1 tablet (10 mg total) by mouth 2 (two) times daily. 60 tablet 11   mometasone-formoterol (DULERA) 100-5 MCG/ACT AERO Inhale 2 puffs into the lungs 2 (two) times daily. 13 g 5   torsemide (DEMADEX) 20 MG tablet Take 2 tablets (40 mg total) by mouth 2 (two) times daily. (Patient taking differently: Take 20 mg by mouth 2 (two) times daily.) 120 tablet 1   No facility-administered medications prior to visit.     Review of Systems:   Constitutional:   No  weight loss, night sweats,  Fevers, chills, + fatigue, or  lassitude.  HEENT:   No headaches,  Difficulty swallowing,  Tooth/dental problems, or  Sore throat,                No sneezing, itching, ear ache, nasal congestion, post nasal drip,   CV:  No chest pain,  Orthopnea, PND, ++swelling in lower extremities,  no anasarca, dizziness, palpitations, syncope.   GI  No heartburn, indigestion, abdominal pain, nausea, vomiting, diarrhea, change in bowel habits, loss of appetite, bloody stools.   Resp:   No chest wall deformity  Skin: no rash or lesions.  GU: no dysuria, change in color of urine, no urgency or frequency.  No flank pain, no hematuria   MS:  No joint pain or swelling.  No decreased range of motion.  No back pain.    Physical Exam  BP 110/70 (BP Location: Right Arm, Patient Position: Sitting, Cuff Size: Normal)   Pulse 86   Temp 98.3 F (36.8 C) (Oral)   Ht 5\' 10"  (1.778 m)   Wt 234 lb (106.1 kg)   SpO2 93%   BMI 33.58 kg/m   GEN: A/Ox3; pleasant , NAD, chronically  ill-appearing, wheelchair    HEENT:  [place]/AT,  NOSE-clear, THROAT-clear, no lesions, no postnasal drip or exudate noted.  Class IV MP airway  NECK:  Supple w/ fair ROM; no JVD; normal carotid impulses w/o bruits; no thyromegaly or nodules palpated; no lymphadenopathy.    RESP  Clear  P & A; w/o, wheezes/ rales/ or rhonchi. no accessory muscle use, no dullness to percussion  CARD:  RRR, no m/r/g, 1+ peripheral edema, pulses intact, no cyanosis or clubbing.  GI:   Soft & nt; nml bowel sounds; no organomegaly or masses detected.   Musco: Warm bil, no deformities or joint swelling noted.   Neuro: alert, no focal deficits noted.    Skin: Warm, no lesions or rashes    Lab Results:  CBC   BMET    Imaging:        No data to display          No results found for: "NITRICOXIDE"      Assessment & Plan:   COPD (chronic obstructive pulmonary disease) (HCC) COPD prone to recurrent flareups.  Patient has just finished a 5-day course of Levaquin.  Hold on additional antibiotics at this time will bump up his chronic steroids to 20 mg for 5 days and then resume daily dosing of 10 mg.  Change Dulera to Advair due to insurance and financial coverage.  Continue on Spiriva.  Patient is currently in the donut hole for now we will hold off on switching to Trelegy or Breztri.  May need to look into financial assistance going forward.  Also could consider changing over to nebulizers but patient does not want to do that at this time.  He is to continue on Daliresp.  He also is on noninvasive vent at bedtime for recurrent exacerbations and chronic hypoxic and hypercarbic respiratory failure and sleep apnea  Plan   Patient Instructions  Mucinex DM Twice daily  As needed  cough/congestion  Fluids and rest.  Tylenol As needed   Stop Dulera begin Advair 115 2 puffs Twice daily, rinse after use.  Continue Spiriva 2 puffs daily/  Continue Daliresp daily .  Increase Prednisone 20mg  daily for 5 days and then Prednisone   10mg  daily.   Delsym 2 tsp Twice daily  for cough As needed   Tessalon Three times a day  for cough As needed   Continue on Claritin 10mg  daily.  Albuterol inhaler or Duoneb As needed   Continue on Oxygen 2l/m as needed to keep Oxygen Levels >88-90%.  Continue on Trilogy vent /BIPAP At bedtime  with Oxygen 3 l/m  BIPAP /NIV download  Flutter valve Twice daily   Order for home health evaluation for services.  Follow up with Dr. 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  Patient is continue on nocturnal vent.  Download has been requested.  Plan  Patient Instructions  Mucinex DM Twice daily  As needed  cough/congestion  Fluids and rest.  Tylenol As needed   Stop Dulera begin Advair 115 2 puffs Twice daily, rinse after use.  Continue Spiriva 2 puffs daily/  Continue Daliresp daily .  Increase Prednisone 20mg  daily for 5 days and then Prednisone  10mg  daily.  Delsym 2 tsp Twice daily  for cough As needed   Tessalon Three times a day  for cough As needed   Continue on Claritin 10mg  daily.  Albuterol inhaler or Duoneb As needed   Continue on Oxygen 2l/m as needed to keep Oxygen Levels >88-90%.  Continue on Trilogy vent /BIPAP At bedtime  with Oxygen 3 l/m  BIPAP /NIV download  Flutter valve Twice daily   Order for home health evaluation for services.  Follow up with Dr. Delton Coombes or Ahjanae Cassel NP in 2 months  and As needed   Please contact office for sooner follow up if symptoms do not improve or worsen or seek emergency care     Chronic respiratory failure with hypoxia (HCC) Continue on oxygen with activity and at bedtime with NIV to maintain O2 saturations greater than 88 to 90%  Chronic combined systolic and  diastolic CHF (congestive heart failure) (HCC) Patient has chronic lower extremity edema.  Does not appear to be acutely/overt decompensated.  Patient is encouraged to use his diuretics as recommended.  Continue on a low-salt diet.  Patient says he has been eating fast food this week.  Encouraged him on healthier food choices.  Plan  Patient Instructions  Mucinex DM Twice daily  As needed  cough/congestion  Fluids and rest.  Tylenol As needed   Stop Dulera begin Advair 115 2 puffs Twice daily, rinse after use.  Continue Spiriva 2 puffs daily/  Continue Daliresp daily .  Increase Prednisone 20mg  daily for 5 days and then Prednisone  10mg  daily.   Delsym 2 tsp Twice daily  for cough As needed   Tessalon Three times a day  for cough As needed   Continue on Claritin 10mg  daily.  Albuterol inhaler or Duoneb As needed   Continue on Oxygen 2l/m as needed to keep Oxygen Levels >88-90%.  Continue on Trilogy vent /BIPAP At bedtime  with Oxygen 3 l/m  BIPAP /NIV download  Flutter valve Twice daily   Order for home health evaluation for services.  Follow up with Dr. Delton Coombes or Avin Upperman NP in 2 months  and As needed   Please contact office for sooner follow up if symptoms do not improve or worsen or seek emergency care     Physical deconditioning Significant physical deconditioning with multiple comorbidities.  Patient lives alone.  Will set patient up for home health evaluation to see what services he qualifies for.  Needs some in-home assistance.     Rubye Oaks, NP 09/16/2023

## 2023-09-16 NOTE — Patient Instructions (Addendum)
Mucinex DM Twice daily  As needed  cough/congestion  Fluids and rest.  Tylenol As needed   Stop Dulera begin Advair 115 2 puffs Twice daily, rinse after use.  Continue Spiriva 2 puffs daily/  Continue Daliresp daily .  Increase Prednisone 20mg  daily for 5 days and then Prednisone  10mg  daily.   Delsym 2 tsp Twice daily  for cough As needed   Tessalon Three times a day  for cough As needed   Continue on Claritin 10mg  daily.  Albuterol inhaler or Duoneb As needed   Continue on Oxygen 2l/m as needed to keep Oxygen Levels >88-90%.  Continue on Trilogy vent /BIPAP At bedtime  with Oxygen 3 l/m  BIPAP /NIV download  Flutter valve Twice daily   Order for home health evaluation for services.  Follow up with Dr. Delton Coombes or Rhyse Skowron NP in 2 months  and As needed   Please contact office for sooner follow up if symptoms do not improve or worsen or seek emergency care

## 2023-09-16 NOTE — Telephone Encounter (Signed)
I did not dx him with Pneumonia. He has COPD -was treated for slow to resolve flare . He just had a chest xray in August that did not show any pneumonia . I did not realize that [employer] help DME was recommending imaging .

## 2023-09-16 NOTE — Assessment & Plan Note (Signed)
Significant physical deconditioning with multiple comorbidities.  Patient lives alone.  Will set patient up for home health evaluation to see what services he qualifies for.  Needs some in-home assistance.

## 2023-09-16 NOTE — Telephone Encounter (Signed)
ATC Brad with [employer] regarding NIV download for patient.  Left detailed message to e-mail download to me.

## 2023-09-16 NOTE — Telephone Encounter (Signed)
PT says [employer] wants a chest xray done to insure he does not have pnumonia. May I call him to have him come in for rads tomorrow?  [phone]

## 2023-09-16 NOTE — Assessment & Plan Note (Signed)
COPD prone to recurrent flareups.  Patient has just finished a 5-day course of Levaquin.  Hold on additional antibiotics at this time will bump up his chronic steroids to 20 mg for 5 days and then resume daily dosing of 10 mg.  Change Dulera to Advair due to insurance and financial coverage.  Continue on Spiriva.  Patient is currently in the donut hole for now we will hold off on switching to Trelegy or Breztri.  May need to look into financial assistance going forward.  Also could consider changing over to nebulizers but patient does not want to do that at this time.  He is to continue on Daliresp.  He also is on noninvasive vent at bedtime for recurrent exacerbations and chronic hypoxic and hypercarbic respiratory failure and sleep apnea  Plan   Patient Instructions  Mucinex DM Twice daily  As needed  cough/congestion  Fluids and rest.  Tylenol As needed   Stop Dulera begin Advair 115 2 puffs Twice daily, rinse after use.  Continue Spiriva 2 puffs daily/  Continue Daliresp daily .  Increase Prednisone 20mg  daily for 5 days and then Prednisone  10mg  daily.   Delsym 2 tsp Twice daily  for cough As needed   Tessalon Three times a day  for cough As needed   Continue on Claritin 10mg  daily.  Albuterol inhaler or Duoneb As needed   Continue on Oxygen 2l/m as needed to keep Oxygen Levels >88-90%.  Continue on Trilogy vent /BIPAP At bedtime  with Oxygen 3 l/m  BIPAP /NIV download  Flutter valve Twice daily   Order for home health evaluation for services.  Follow up with Dr. Delton Coombes or Armin Yerger NP in 2 months  and As needed   Please contact office for sooner follow up if symptoms do not improve or worsen or seek emergency care

## 2023-09-16 NOTE — Assessment & Plan Note (Signed)
Patient has chronic lower extremity edema.  Does not appear to be acutely/overt decompensated.  Patient is encouraged to use his diuretics as recommended.  Continue on a low-salt diet.  Patient says he has been eating fast food this week.  Encouraged him on healthier food choices.  Plan  Patient Instructions  Mucinex DM Twice daily  As needed  cough/congestion  Fluids and rest.  Tylenol As needed   Stop Dulera begin Advair 115 2 puffs Twice daily, rinse after use.  Continue Spiriva 2 puffs daily/  Continue Daliresp daily .  Increase Prednisone 20mg  daily for 5 days and then Prednisone  10mg  daily.   Delsym 2 tsp Twice daily  for cough As needed   Tessalon Three times a day  for cough As needed   Continue on Claritin 10mg  daily.  Albuterol inhaler or Duoneb As needed   Continue on Oxygen 2l/m as needed to keep Oxygen Levels >88-90%.  Continue on Trilogy vent /BIPAP At bedtime  with Oxygen 3 l/m  BIPAP /NIV download  Flutter valve Twice daily   Order for home health evaluation for services.  Follow up with Dr. Delton Coombes or Ericson Nafziger NP in 2 months  and As needed   Please contact office for sooner follow up if symptoms do not improve or worsen or seek emergency care

## 2023-09-20 ENCOUNTER — Encounter (HOSPITAL_COMMUNITY): Payer: Self-pay | Admitting: Internal Medicine

## 2023-09-20 ENCOUNTER — Ambulatory Visit (HOSPITAL_COMMUNITY)
Admission: RE | Admit: 2023-09-20 | Discharge: 2023-09-20 | Disposition: A | Discharge status: Home / Self Care | Payer: Medicare Other | Source: Ambulatory Visit | Attending: Internal Medicine | Admitting: Internal Medicine

## 2023-09-20 VITALS — BP 110/80 | HR 100 | Wt 234.0 lb

## 2023-09-20 DIAGNOSIS — I442 Atrioventricular block, complete: Secondary | ICD-10-CM | POA: Diagnosis not present

## 2023-09-20 DIAGNOSIS — Z720 Tobacco use: Secondary | ICD-10-CM

## 2023-09-20 DIAGNOSIS — Z6833 Body mass index (BMI) 33.0-33.9, adult: Secondary | ICD-10-CM | POA: Insufficient documentation

## 2023-09-20 DIAGNOSIS — J449 Chronic obstructive pulmonary disease, unspecified: Secondary | ICD-10-CM | POA: Diagnosis not present

## 2023-09-20 DIAGNOSIS — Z7901 Long term (current) use of anticoagulants: Secondary | ICD-10-CM | POA: Insufficient documentation

## 2023-09-20 DIAGNOSIS — I428 Other cardiomyopathies: Secondary | ICD-10-CM | POA: Insufficient documentation

## 2023-09-20 DIAGNOSIS — I82409 Acute embolism and thrombosis of unspecified deep veins of unspecified lower extremity: Secondary | ICD-10-CM | POA: Insufficient documentation

## 2023-09-20 DIAGNOSIS — G4733 Obstructive sleep apnea (adult) (pediatric): Secondary | ICD-10-CM | POA: Insufficient documentation

## 2023-09-20 DIAGNOSIS — F17210 Nicotine dependence, cigarettes, uncomplicated: Secondary | ICD-10-CM | POA: Insufficient documentation

## 2023-09-20 DIAGNOSIS — I5022 Chronic systolic (congestive) heart failure: Secondary | ICD-10-CM | POA: Insufficient documentation

## 2023-09-20 DIAGNOSIS — Z5986 Financial insecurity: Secondary | ICD-10-CM | POA: Insufficient documentation

## 2023-09-20 DIAGNOSIS — I5042 Chronic combined systolic (congestive) and diastolic (congestive) heart failure: Secondary | ICD-10-CM | POA: Diagnosis not present

## 2023-09-20 DIAGNOSIS — I110 Hypertensive heart disease with heart failure: Secondary | ICD-10-CM | POA: Insufficient documentation

## 2023-09-20 DIAGNOSIS — E669 Obesity, unspecified: Secondary | ICD-10-CM

## 2023-09-20 DIAGNOSIS — I82622 Acute embolism and thrombosis of deep veins of left upper extremity: Secondary | ICD-10-CM

## 2023-09-20 DIAGNOSIS — Z95810 Presence of automatic (implantable) cardiac defibrillator: Secondary | ICD-10-CM | POA: Insufficient documentation

## 2023-09-20 LAB — BASIC METABOLIC PANEL
Anion gap: 12 (ref 5–15)
BUN: 16 mg/dL (ref 8–23)
CO2: 29 mmol/L (ref 22–32)
Calcium: 9 mg/dL (ref 8.9–10.3)
Chloride: 96 mmol/L — ABNORMAL LOW (ref 98–111)
Creatinine, Ser: 1.04 mg/dL (ref 0.61–1.24)
GFR, Estimated: >60 mL/min (ref >60)
Glucose, Bld: 154 mg/dL — ABNORMAL HIGH (ref 70–99)
Potassium: 3.9 mmol/L (ref 3.5–5.1)
Sodium: 137 mmol/L (ref 135–145)

## 2023-09-20 LAB — BRAIN NATRIURETIC PEPTIDE: B Natriuretic Peptide: 202.8 pg/mL — ABNORMAL HIGH (ref 0.0–100.0)

## 2023-09-20 NOTE — Progress Notes (Signed)
ADVANCED HF CLINIC NOTE  HF MD: Dr. Gala Romney  Pulmonology: Dr Delton Coombes   Reason for Visit:  F/u for Chronic Systolic Heart Failure   HPI: [name] is a 67 y.o. male with h/o obesity, HTN, COPD with ongoing tobacco use and chronic systolic heart failure.    Cath 03/2017 - normal coronaries with EF 40% and global HK.  He saw Dr. Allyson Sabal on 07/01/2018 because of recurrent chest pain.  Myoview stress test performed 07/21/2018 showed EF 38% inferior scar with septal ischemia.  Echo 06/2018  EF of 30 to 35%.  He was referred for cardiac CT.   Cardiac CT on 9/19.  Calcium score 148 (74th percentile)  LAD 25% otherwise normal cors. Dilated pulmonary arteries suggestive of PH.   Worked for [employer]. Retired 5/21.  Echo 12/20 EF improved to 45%. Mild RV dysfunction   Admitted 1/22 for acute on chronic hypoxic respiratory failure secondary to acute COPD and CHF w/ volume overload. He had massive diuresis w/ IV Lasix, diuresed down from admit wt of 265 lb to 210 lb. Echo 12/21, EF 40-45% (unchanged). RV mildly reduced, RVSP 49.   Followed in clinic, Jardiance and Cleda Daub added - dose adjusted due to intolerances. He then stopped these on his own.   In 4/22, had near syncope, Zio showed intermittent CHB. Repeat echo EF 30-35%% Had St Jude dual chamber PM 04/16/21. Had some PMT and device adjusted by EP .   Echo 07/31/21 with EF 30-35%,  moderately reduced RV, and diffuse HK worse in inferior base.  Underwent R/L cath in 9/22 for persistent SOB. No CAD. EF 35% Mildly elevated filling pressures with normal output.   Subsequently underwent CRT-D upgrade attempt on 10/07/21 by Dr. Elberta Fortis due to high-degree of RV pacing. 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   S/p Barostim 2/23  S/p BiV upgrade 11/11/22. 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   Echo 01/13/23 EF < 20%, RV mildly down.  RHC 3/24 RA = 7 RV = 37/11 PA = 39/20 (28) PCW = 13 Fick cardiac  output/index = 5.2/2.3 PVR = 2.9 WU FA sat = 91% PA sat = 63%  Returns for f/u. Says he is very fatigued. Now on full disability. SOB and fatigued with minimal activity. Drives his car to mailbox. (100 feet). Has been intolerant of all GDMT except losartan but said he stopped that as well because his BP was fine. Still smoking a little.Still drinkin a few vodka mixed drinks per day.   ICD interrogated:No AF/VT. Volume was up but now back down. Personally reviewed   Cardiac studies: Echo 06/2018 LVEF 30-35%, RV normal  Echo 12/2018 LVEF 40-45%, RV normal  Echo 11/2020 LVEF 40-45%, RV mildly reduced Echo 03/2021 30-35%, RV mildly reduced  Echo 07/2021 EF 30-35% RV moderately reduced  Echo 01/13/23 EF < 20%, RV mildly down  Review of systems complete and found to be negative unless listed in HPI.    Past Medical History:  Diagnosis Date   Adenomatous colon polyp    Allergy    Anal fissure    Arthritis    Asthma    Bronchitis    CHF (congestive heart failure) (HCC)    COPD, group D, by GOLD 2017 classification (HCC)    Emphysema lung (HCC)    GERD (gastroesophageal reflux disease)    History of hiatal hernia    Hyperlipidemia    Hypertension    NICM (nonischemic cardiomyopathy) (HCC)  OSA (obstructive sleep apnea)    Pneumonia    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   Requires supplemental oxygen     Current Outpatient Medications  Medication Sig Dispense Refill   albuterol (PROVENTIL) (2.5 MG/3ML) 0.083% nebulizer solution Take 3 mLs (2.5 mg total) by nebulization every 4 (four) hours as needed for wheezing or shortness of breath. 75 mL 11   allopurinol (ZYLOPRIM) 300 MG tablet Take 1 tablet (300 mg total) by mouth daily. 90 tablet 1   ALPRAZolam (XANAX) 0.5 MG tablet Take 1 tablet (0.5 mg total) by mouth 3 (three) times daily. 270 tablet 0   AMBULATORY NON FORMULARY MEDICATION Medication Name: Nitroglycerin gel 0.125% apply 2-3 times daily to the anal canal.  (Patient taking differently: Place 1 Application rectally daily as needed (Fissure). Medication Name: Nitroglycerin gel 0.125% apply 2-3 times daily to the anal canal.) 30 g 1   DALIRESP 500 MCG TABS tablet TAKE 1 TABLET(500 MCG) BY MOUTH DAILY 30 tablet 2   diclofenac Sodium (VOLTAREN) 1 % GEL Apply 1 Application topically in the morning and at bedtime.     EPINEPHrine 0.3 mg/0.3 mL IJ SOAJ injection Inject 0.3 mg into the muscle as needed for anaphylaxis. 1 each 0   fluticasone (FLONASE) 50 MCG/ACT nasal spray SHAKE LIQUID AND USE 2 SPRAYS IN EACH NOSTRIL DAILY (Patient taking differently: Place 1 spray into both nostrils daily as needed for allergies.) 16 g 2   guaiFENesin (MUCINEX) 600 MG 12 hr tablet Take 1 tablet (600 mg total) by mouth 2 (two) times daily. (Patient taking differently: Take 600 mg by mouth 2 (two) times daily as needed for cough or to loosen phlegm.) 60 tablet 5   ipratropium (ATROVENT) 0.06 % nasal spray Place 2 sprays into both nostrils 4 (four) times daily as needed for rhinitis. 45 mL 3   ipratropium-albuterol (DUONEB) 0.5-2.5 (3) MG/3ML SOLN Take 3 mLs by nebulization every 4 (four) hours as needed. 360 mL 5   lidocaine (XYLOCAINE) 5 % ointment Apply topically 3 (three) times daily as needed. 35.44 g 1   Lidocaine, Anorectal, 5 % CREA Apply 1 application topically 2 (two) times daily. (Patient taking differently: Apply 1 application  topically daily as needed (Fissure).) 30 g 1   loratadine (CLARITIN) 10 MG tablet Take 10 mg by mouth daily.     losartan (COZAAR) 25 MG tablet Take 1 tablet (25 mg total) by mouth daily. 90 tablet 3   metolazone (ZAROXOLYN) 2.5 MG tablet Take 1 tablet (2.5 mg total) by mouth as directed. 5 tablet 6   mometasone-formoterol (DULERA) 100-5 MCG/ACT AERO Inhale 2 puffs into the lungs 2 (two) times daily.     nicotine (NICODERM CQ - DOSED IN MG/24 HOURS) 14 mg/24hr patch Place 1 patch (14 mg total) onto the skin daily. 28 patch 0   nystatin  (MYCOSTATIN) 100000 UNIT/ML suspension Take 5 mLs (500,000 Units total) by mouth in the morning, at noon, and at bedtime. 45 mL 0   omeprazole (PRILOSEC) 20 MG capsule TAKE 1 CAPSULE(20 MG) BY MOUTH TWICE DAILY BEFORE A MEAL FOR ACID REFLUX 180 capsule 0   potassium chloride SA (KLOR-CON M20) 20 MEQ tablet Take 1 tablet (20 mEq total) by mouth daily. Take an extra 40 meq when you take metolazone 90 tablet 3   predniSONE (DELTASONE) 20 MG tablet Take 1 tablet (20 mg total) by mouth daily with breakfast. 5 tablet 0   Testosterone 20.25 MG/1.25GM (1.62%) GEL  Place 1.25 g onto the skin daily. (Patient taking differently: Place 1 Pump onto the skin every other day.) 112.5 g 0   Tiotropium Bromide Monohydrate (SPIRIVA RESPIMAT) 2.5 MCG/ACT AERS Inhale 2 puffs into the lungs daily. 4 g 6   torsemide (DEMADEX) 20 MG tablet Take 2 tablets (40 mg total) by mouth 2 (two) times daily. (Patient taking differently: Take 20 mg by mouth daily. Take 2 tablets (40mg ) by mouth  daily) 120 tablet 1   VENTOLIN HFA 108 (90 Base) MCG/ACT inhaler INHALE 2 PUFFS INTO THE LUNGS EVERY 6 HOURS AS NEEDED FOR WHEEZING 18 g 4   vitamin B-12 (CYANOCOBALAMIN) 1000 MCG tablet Take 1,000 mcg by mouth daily.     No current facility-administered medications for this encounter.   Allergies  Allergen Reactions   Bee Venom Shortness Of Breath and Swelling   Cefdinir Other (See Comments)    Patient states he couldn't really breath    Spironolactone Anaphylaxis   Bactrim [Sulfamethoxazole-Trimethoprim] Other (See Comments)    Mouth burning   Daliresp [Roflumilast] Other (See Comments)    Dizzy, headache, leg pain per patient. 02/25/22. Tolerates in low doses    Jardiance [Empagliflozin] Nausea Only and Other (See Comments)    Lightheadness Dizziness   Penicillins Swelling and Other (See Comments)    Childhood rxn--MD stated he "almost died" Has patient had a PCN reaction causing immediate rash, facial/tongue/throat swelling, SOB  or lightheadedness with hypotension:Yes Has patient had a PCN reaction causing severe rash involving mucus membranes or skin necrosis:unsure Has patient had a PCN reaction that required hospitalization:unsure Has patient had a PCN reaction occurring within the last 10 years:No If all of the above answers are "NO", then may proceed with Cephalosporin use.     Singulair [Montelukast] Swelling   Clindamycin Other (See Comments)    Tongue swelling   Doxycycline Nausea And Vomiting    Severe stomach upset per patient   E-Mycin [Erythromycin Base] Swelling   Rosuvastatin Other (See Comments)    Myalgias (intolerance)   Social History   Socioeconomic History   Marital status: Single    Spouse name: Not on file   Number of children: 1   Years of education: 54   Highest education level: 12th grade  Occupational History   Occupation: disabled  Tobacco Use   Smoking status: Former    Current packs/day: 0.00    Average packs/day: 2.0 packs/day for 46.0 years (92.0 ttl pk-yrs)    Types: Cigarettes    Start date: 01/28/1976    Quit date: 01/27/2022    Years since quitting: 1.6    Passive exposure: Past   Smokeless tobacco: Never   Tobacco comments:    Chews on a cigar.  09/16/2023 hfb  Vaping Use   Vaping status: Never Used  Substance and Sexual Activity   Alcohol use: Not Currently    Alcohol/week: 28.0 standard drinks of alcohol    Types: 28 Cans of beer per week   Drug use: No   Sexual activity: Yes    Partners: Female    Birth control/protection: None  Other Topics Concern   Not on file  Social History Narrative   Right handed   Tea sometimes and coffee (1/2 caffeine)   Social Determinants of Health   Financial Resource Strain: Medium Risk (03/15/2023)   Overall Financial Resource Strain (CARDIA)    Difficulty of Paying Living Expenses: Somewhat hard  Food Insecurity: No Food Insecurity (06/02/2023)   Hunger Vital Sign  Worried About Programme researcher, broadcasting/film/video in the Last Year:  Never true    Ran Out of Food in the Last Year: Never true  Transportation Needs: No Transportation Needs (06/02/2023)   PRAPARE - Administrator, Civil Service (Medical): No    Lack of Transportation (Non-Medical): No  Physical Activity: Unknown (03/15/2023)   Exercise Vital Sign    Days of Exercise per Week: 0 days    Minutes of Exercise per Session: Not on file  Stress: Stress Concern Present (03/15/2023)   [employer] of Occupational Health - Occupational Stress Questionnaire    Feeling of Stress : Rather much  Social Connections: Unknown (03/15/2023)   Social Connection and Isolation Panel [NHANES]    Frequency of Communication with Friends and Family: More than three times a week    Frequency of Social Gatherings with Friends and Family: Twice a week    Attends Religious Services: Patient declined    Database administrator or Organizations: No    Attends Engineer, structural: Not on file    Marital Status: Divorced  Intimate Partner Violence: Not At Risk (06/02/2023)   Humiliation, Afraid, Rape, and Kick questionnaire    Fear of Current or Ex-Partner: No    Emotionally Abused: No    Physically Abused: No    Sexually Abused: No   Family History  Problem Relation Age of Onset   Lung cancer Father    High blood pressure Mother    Diabetes Maternal Grandmother    Colon polyps Neg Hx    Esophageal cancer Neg Hx    Pancreatic cancer Neg Hx    Stomach cancer Neg Hx    Wt Readings from Last 3 Encounters:  09/20/23 106.1 kg (234 lb)  09/16/23 106.1 kg (234 lb)  08/18/23 105.4 kg (232 lb 6.4 oz)   BP 110/80   Pulse 100   Wt 106.1 kg (234 lb)   SpO2 93%   BMI 33.58 kg/m   PHYSICAL EXAM: General:  Obese male. No resp difficulty HEENT: normal Neck: supple. no JVD. Carotids 2+ bilat; no bruits. No lymphadenopathy or thryomegaly appreciated. Cor: PMI nondisplaced. Regular rate & rhythm. No rubs, gallops or murmurs. Lungs: decreased throughout +  coarse Abdomen: obese soft, nontender, nondistended. No hepatosplenomegaly. No bruits or masses. Good bowel sounds. Extremities: no cyanosis, clubbing, rash, trace edema Neuro: alert & orientedx3, cranial nerves grossly intact. moves all 4 extremities w/o difficulty. Affect pleasant   ASSESSMENT & PLAN: 1. Chronic systolic HF due to NICM  - cath 4/18 normal cors. EF 48%  - Echo 7/19  EF 30-35%. RV normal.  - Cardiac CT 9/19.  Calcium score 148 (74th percentile)  LAD 25% otherwise normal cors. Dilated pulmonary arteries suggestive of PH.  - Suspect NICM due to HTN and inadequately treated OSA (He now reports improved nightly compliance w/ CPAP). - Echo 1/22 EF 40-45%. RV mildly reduced. RVSP 49 - Admit 4/22 for symptomatic bradycardia/CHF. Echo LVEF 30%. RV mildly reduced - Echo 07/31/21 EF 30-35 % and RV moderately reduced.  - R/LHC 9/22 No CAD EF 35%, Mildly elevated filling pressures with normal output - Failed initial upgrade to CRT-D in 2022  - S/p BiV upgrade 11/11/22. 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  - s/p Barostim. - Echo 1/24 EF < 20% - RHC 3/24  RA 7 PA 39/20 (28) PCW 13 Fick 5.2/2.3 - NYHA IIIB - Volume status up and down. Well controlled today.  Continue torsemide  20 daily + PRN metolazone - He recently stopped losartan 25 because he said his BP was "fine" and he didn't need it - No spiro (H/o Anaphylaxis).  - Intolerant to Jardiance (positional dizziness and nausea).  - Intolerant bisoprolol due to fatigue, coreg due to dizziness, and toprol due to dry mouth.  - He continues to have progressive LV dysfunction with advanced HF symptoms but recent RHC ok. Not candidate for VAD due to lung disease and noncompliance. - Encouraged him to restart losartan - Check labs today  2. CHB:  - s/p PPM 4/22 - followed by Dr. Elberta Fortis  - s/p CRT-D upgrade 11/23 - ICD interrogated in clinic as above  3. HTN - Well controlled   4. COPD  - GOLD Group D.  - Follows with Pulmonary  (Dr. Delton Coombes).  - Home O2 PRN - Again discussed need to stop smoking  5. OSA - Continue BIPA  6. Morbid obesity  - Previously discussed GLP1RA but he is not interested - Body mass index is 33.58 kg/m.  7. Pulmonary Nodules - Followed by Dr. Delton Coombes  8. DVT - L brachial vein 12/23. - Now on Eliquis. No bleeding  Arvilla Meres, MD 3:27 PM

## 2023-09-20 NOTE — Patient Instructions (Signed)
Great to see you today!!!  No changes, continue current medications  Labs done today, your results will be available in MyChart, we will contact you for abnormal readings.  Your physician recommends that you schedule a follow-up appointment in: 4 months  If you have any questions or concerns before your next appointment please send [place] a message through [place] or call our office at [phone].    TO LEAVE A MESSAGE FOR THE NURSE SELECT OPTION 2, PLEASE LEAVE A MESSAGE INCLUDING: YOUR NAME DATE OF BIRTH CALL BACK NUMBER REASON FOR CALL**this is important as we prioritize the call backs  YOU WILL RECEIVE A CALL BACK THE SAME DAY AS LONG AS YOU CALL BEFORE 4:00 PM  At the [facility], you and your health needs are our priority. As part of our continuing mission to provide you with exceptional heart care, we have created designated Provider Care Teams. These Care Teams include your primary Cardiologist (physician) and Advanced Practice Providers (APPs- Physician Assistants and Nurse Practitioners) who all work together to provide you with the care you need, when you need it.   You may see any of the following providers on your designated Care Team at your next follow up: Dr Arvilla Meres Dr Marca Ancona Dr. Marcos Eke, NP Robbie Lis, [place] [facility] [employer], [place] Brynda Peon, NP Karle Plumber, PharmD   Please be sure to bring in all your medications bottles to every appointment.    Thank you for choosing [facility]

## 2023-09-21 NOTE — Telephone Encounter (Signed)
Patient is trying to return missed call. Please call back.

## 2023-09-21 NOTE — Telephone Encounter (Signed)
[id number]. 

## 2023-09-22 NOTE — Telephone Encounter (Signed)
Pt states he is ok he says after visit here he was coughing up yellow phlegm. He didn't think he needed a cxr. Notified of Tammy's response nothing further needed.

## 2023-09-24 DIAGNOSIS — Z9981 Dependence on supplemental oxygen: Secondary | ICD-10-CM | POA: Diagnosis not present

## 2023-09-24 DIAGNOSIS — J9612 Chronic respiratory failure with hypercapnia: Secondary | ICD-10-CM | POA: Diagnosis not present

## 2023-09-24 DIAGNOSIS — I5042 Chronic combined systolic (congestive) and diastolic (congestive) heart failure: Secondary | ICD-10-CM | POA: Diagnosis not present

## 2023-09-24 DIAGNOSIS — I428 Other cardiomyopathies: Secondary | ICD-10-CM | POA: Diagnosis not present

## 2023-09-24 DIAGNOSIS — Z87891 Personal history of nicotine dependence: Secondary | ICD-10-CM | POA: Diagnosis not present

## 2023-09-24 DIAGNOSIS — J441 Chronic obstructive pulmonary disease with (acute) exacerbation: Secondary | ICD-10-CM | POA: Diagnosis not present

## 2023-09-24 DIAGNOSIS — Z7952 Long term (current) use of systemic steroids: Secondary | ICD-10-CM | POA: Diagnosis not present

## 2023-09-24 DIAGNOSIS — E785 Hyperlipidemia, unspecified: Secondary | ICD-10-CM | POA: Diagnosis not present

## 2023-09-24 DIAGNOSIS — J9611 Chronic respiratory failure with hypoxia: Secondary | ICD-10-CM | POA: Diagnosis not present

## 2023-09-24 DIAGNOSIS — Z7951 Long term (current) use of inhaled steroids: Secondary | ICD-10-CM | POA: Diagnosis not present

## 2023-09-24 DIAGNOSIS — I110 Hypertensive heart disease with heart failure: Secondary | ICD-10-CM | POA: Diagnosis not present

## 2023-09-24 DIAGNOSIS — G4733 Obstructive sleep apnea (adult) (pediatric): Secondary | ICD-10-CM | POA: Diagnosis not present

## 2023-09-24 DIAGNOSIS — J431 Panlobular emphysema: Secondary | ICD-10-CM | POA: Diagnosis not present

## 2023-09-24 DIAGNOSIS — Z556 Problems related to health literacy: Secondary | ICD-10-CM | POA: Diagnosis not present

## 2023-09-27 DIAGNOSIS — J449 Chronic obstructive pulmonary disease, unspecified: Secondary | ICD-10-CM | POA: Diagnosis not present

## 2023-09-27 DIAGNOSIS — J9610 Chronic respiratory failure, unspecified whether with hypoxia or hypercapnia: Secondary | ICD-10-CM | POA: Diagnosis not present

## 2023-09-28 DIAGNOSIS — J449 Chronic obstructive pulmonary disease, unspecified: Secondary | ICD-10-CM | POA: Diagnosis not present

## 2023-09-30 DIAGNOSIS — Z7952 Long term (current) use of systemic steroids: Secondary | ICD-10-CM | POA: Diagnosis not present

## 2023-09-30 DIAGNOSIS — E785 Hyperlipidemia, unspecified: Secondary | ICD-10-CM | POA: Diagnosis not present

## 2023-09-30 DIAGNOSIS — Z556 Problems related to health literacy: Secondary | ICD-10-CM | POA: Diagnosis not present

## 2023-09-30 DIAGNOSIS — J9611 Chronic respiratory failure with hypoxia: Secondary | ICD-10-CM | POA: Diagnosis not present

## 2023-09-30 DIAGNOSIS — G4733 Obstructive sleep apnea (adult) (pediatric): Secondary | ICD-10-CM | POA: Diagnosis not present

## 2023-09-30 DIAGNOSIS — J431 Panlobular emphysema: Secondary | ICD-10-CM | POA: Diagnosis not present

## 2023-09-30 DIAGNOSIS — I110 Hypertensive heart disease with heart failure: Secondary | ICD-10-CM | POA: Diagnosis not present

## 2023-09-30 DIAGNOSIS — I428 Other cardiomyopathies: Secondary | ICD-10-CM | POA: Diagnosis not present

## 2023-09-30 DIAGNOSIS — J441 Chronic obstructive pulmonary disease with (acute) exacerbation: Secondary | ICD-10-CM | POA: Diagnosis not present

## 2023-09-30 DIAGNOSIS — I5042 Chronic combined systolic (congestive) and diastolic (congestive) heart failure: Secondary | ICD-10-CM | POA: Diagnosis not present

## 2023-09-30 DIAGNOSIS — Z7951 Long term (current) use of inhaled steroids: Secondary | ICD-10-CM | POA: Diagnosis not present

## 2023-09-30 DIAGNOSIS — J9612 Chronic respiratory failure with hypercapnia: Secondary | ICD-10-CM | POA: Diagnosis not present

## 2023-09-30 DIAGNOSIS — Z9981 Dependence on supplemental oxygen: Secondary | ICD-10-CM | POA: Diagnosis not present

## 2023-09-30 DIAGNOSIS — Z87891 Personal history of nicotine dependence: Secondary | ICD-10-CM | POA: Diagnosis not present

## 2023-10-04 ENCOUNTER — Other Ambulatory Visit: Payer: Self-pay | Admitting: Internal Medicine

## 2023-10-04 ENCOUNTER — Encounter: Payer: Self-pay | Admitting: Internal Medicine

## 2023-10-04 DIAGNOSIS — F411 Generalized anxiety disorder: Secondary | ICD-10-CM

## 2023-10-05 ENCOUNTER — Telehealth: Payer: Self-pay | Admitting: Internal Medicine

## 2023-10-05 NOTE — Telephone Encounter (Signed)
Patient wants to know if he needs to take the alopurinol with the cochicine?  Please call patient and leave message if he doesn't answer

## 2023-10-06 DIAGNOSIS — I428 Other cardiomyopathies: Secondary | ICD-10-CM | POA: Diagnosis not present

## 2023-10-06 DIAGNOSIS — Z7952 Long term (current) use of systemic steroids: Secondary | ICD-10-CM | POA: Diagnosis not present

## 2023-10-06 DIAGNOSIS — J441 Chronic obstructive pulmonary disease with (acute) exacerbation: Secondary | ICD-10-CM | POA: Diagnosis not present

## 2023-10-06 DIAGNOSIS — E785 Hyperlipidemia, unspecified: Secondary | ICD-10-CM | POA: Diagnosis not present

## 2023-10-06 DIAGNOSIS — Z87891 Personal history of nicotine dependence: Secondary | ICD-10-CM | POA: Diagnosis not present

## 2023-10-06 DIAGNOSIS — Z9981 Dependence on supplemental oxygen: Secondary | ICD-10-CM | POA: Diagnosis not present

## 2023-10-06 DIAGNOSIS — Z556 Problems related to health literacy: Secondary | ICD-10-CM | POA: Diagnosis not present

## 2023-10-06 DIAGNOSIS — J9611 Chronic respiratory failure with hypoxia: Secondary | ICD-10-CM | POA: Diagnosis not present

## 2023-10-06 DIAGNOSIS — J431 Panlobular emphysema: Secondary | ICD-10-CM | POA: Diagnosis not present

## 2023-10-06 DIAGNOSIS — I110 Hypertensive heart disease with heart failure: Secondary | ICD-10-CM | POA: Diagnosis not present

## 2023-10-06 DIAGNOSIS — G4733 Obstructive sleep apnea (adult) (pediatric): Secondary | ICD-10-CM | POA: Diagnosis not present

## 2023-10-06 DIAGNOSIS — Z7951 Long term (current) use of inhaled steroids: Secondary | ICD-10-CM | POA: Diagnosis not present

## 2023-10-06 DIAGNOSIS — J9612 Chronic respiratory failure with hypercapnia: Secondary | ICD-10-CM | POA: Diagnosis not present

## 2023-10-06 DIAGNOSIS — I5042 Chronic combined systolic (congestive) and diastolic (congestive) heart failure: Secondary | ICD-10-CM | POA: Diagnosis not present

## 2023-10-06 NOTE — Telephone Encounter (Signed)
Please advise 

## 2023-10-06 NOTE — Telephone Encounter (Signed)
Unable to reach patient. LMTRC

## 2023-10-06 NOTE — Telephone Encounter (Signed)
Patient returned call and was advised of what PCP said.

## 2023-10-11 ENCOUNTER — Telehealth: Payer: Self-pay | Admitting: Adult Health

## 2023-10-11 NOTE — Telephone Encounter (Signed)
Robert Church Robert Church states Daliresp and [place] inhaler are not covered by insurance. Insurance needs a request for formulary execption. Request to be expedited. Robert Church phone number is [phone]. May leave detailed message on voicemail.  Patient phone number is [phone].

## 2023-10-12 NOTE — Telephone Encounter (Signed)
PA team, please advise. Thanks ?

## 2023-10-13 ENCOUNTER — Other Ambulatory Visit (HOSPITAL_COMMUNITY): Payer: Self-pay

## 2023-10-13 DIAGNOSIS — G4733 Obstructive sleep apnea (adult) (pediatric): Secondary | ICD-10-CM | POA: Diagnosis not present

## 2023-10-13 DIAGNOSIS — Z87891 Personal history of nicotine dependence: Secondary | ICD-10-CM | POA: Diagnosis not present

## 2023-10-13 DIAGNOSIS — Z7951 Long term (current) use of inhaled steroids: Secondary | ICD-10-CM | POA: Diagnosis not present

## 2023-10-13 DIAGNOSIS — J441 Chronic obstructive pulmonary disease with (acute) exacerbation: Secondary | ICD-10-CM | POA: Diagnosis not present

## 2023-10-13 DIAGNOSIS — Z556 Problems related to health literacy: Secondary | ICD-10-CM | POA: Diagnosis not present

## 2023-10-13 DIAGNOSIS — E785 Hyperlipidemia, unspecified: Secondary | ICD-10-CM | POA: Diagnosis not present

## 2023-10-13 DIAGNOSIS — J9611 Chronic respiratory failure with hypoxia: Secondary | ICD-10-CM | POA: Diagnosis not present

## 2023-10-13 DIAGNOSIS — Z7952 Long term (current) use of systemic steroids: Secondary | ICD-10-CM | POA: Diagnosis not present

## 2023-10-13 DIAGNOSIS — I428 Other cardiomyopathies: Secondary | ICD-10-CM | POA: Diagnosis not present

## 2023-10-13 DIAGNOSIS — I110 Hypertensive heart disease with heart failure: Secondary | ICD-10-CM | POA: Diagnosis not present

## 2023-10-13 DIAGNOSIS — I5042 Chronic combined systolic (congestive) and diastolic (congestive) heart failure: Secondary | ICD-10-CM | POA: Diagnosis not present

## 2023-10-13 DIAGNOSIS — J431 Panlobular emphysema: Secondary | ICD-10-CM | POA: Diagnosis not present

## 2023-10-13 DIAGNOSIS — J9612 Chronic respiratory failure with hypercapnia: Secondary | ICD-10-CM | POA: Diagnosis not present

## 2023-10-13 DIAGNOSIS — Z9981 Dependence on supplemental oxygen: Secondary | ICD-10-CM | POA: Diagnosis not present

## 2023-10-13 NOTE — Telephone Encounter (Signed)
Daliresp generic is showing as covered for $22.54/30 days. Spiriva shows covered for $125.08 due to patient being in the Coverage Gap.

## 2023-10-14 NOTE — Telephone Encounter (Signed)
Lm x1 for patient's mother(DPR). Unable to submit PA until 2025.

## 2023-10-14 NOTE — Telephone Encounter (Signed)
Patient's mother states that the Spiriva and Daliresp are non covered prescriptions for 2025. Dr.Byrum needs to request a formular exception from [facility]. Request to be expedited. Robert Church's number is [phone]. Patient number is [phone].

## 2023-10-15 ENCOUNTER — Other Ambulatory Visit: Payer: Self-pay | Admitting: Emergency Medicine

## 2023-10-15 NOTE — Telephone Encounter (Signed)
Patient's mother is trying to return call. Please call back.

## 2023-10-15 NOTE — Telephone Encounter (Signed)
PT's mom calling. I let her know what Delray Alt said and she is not expressing understanding. Please try again. Her # is [phone]  She states if she can get the Dr. To send in a Formulary Exception she can get the RX's. His Primary Dr. York Spaniel we Rx'd and we need to do this letter.   Daliresp generic is showing as covered for $22.54/30 days. Spiriva shows covered for $125.08 due to patient being in the Coverage Gap.

## 2023-10-19 DIAGNOSIS — G4733 Obstructive sleep apnea (adult) (pediatric): Secondary | ICD-10-CM | POA: Diagnosis not present

## 2023-10-19 DIAGNOSIS — Z556 Problems related to health literacy: Secondary | ICD-10-CM | POA: Diagnosis not present

## 2023-10-19 DIAGNOSIS — Z9981 Dependence on supplemental oxygen: Secondary | ICD-10-CM | POA: Diagnosis not present

## 2023-10-19 DIAGNOSIS — J9612 Chronic respiratory failure with hypercapnia: Secondary | ICD-10-CM | POA: Diagnosis not present

## 2023-10-19 DIAGNOSIS — J9611 Chronic respiratory failure with hypoxia: Secondary | ICD-10-CM | POA: Diagnosis not present

## 2023-10-19 DIAGNOSIS — Z7952 Long term (current) use of systemic steroids: Secondary | ICD-10-CM | POA: Diagnosis not present

## 2023-10-19 DIAGNOSIS — J431 Panlobular emphysema: Secondary | ICD-10-CM | POA: Diagnosis not present

## 2023-10-19 DIAGNOSIS — Z87891 Personal history of nicotine dependence: Secondary | ICD-10-CM | POA: Diagnosis not present

## 2023-10-19 DIAGNOSIS — I5042 Chronic combined systolic (congestive) and diastolic (congestive) heart failure: Secondary | ICD-10-CM | POA: Diagnosis not present

## 2023-10-19 DIAGNOSIS — E785 Hyperlipidemia, unspecified: Secondary | ICD-10-CM | POA: Diagnosis not present

## 2023-10-19 DIAGNOSIS — I428 Other cardiomyopathies: Secondary | ICD-10-CM | POA: Diagnosis not present

## 2023-10-19 DIAGNOSIS — J441 Chronic obstructive pulmonary disease with (acute) exacerbation: Secondary | ICD-10-CM | POA: Diagnosis not present

## 2023-10-19 DIAGNOSIS — I110 Hypertensive heart disease with heart failure: Secondary | ICD-10-CM | POA: Diagnosis not present

## 2023-10-19 DIAGNOSIS — Z7951 Long term (current) use of inhaled steroids: Secondary | ICD-10-CM | POA: Diagnosis not present

## 2023-10-19 NOTE — Telephone Encounter (Signed)
Called and spoek with Robert Church he verbalized understanding. Nfn until January for his requested formulary Exception form

## 2023-10-20 ENCOUNTER — Other Ambulatory Visit: Payer: Self-pay | Admitting: Emergency Medicine

## 2023-10-21 ENCOUNTER — Other Ambulatory Visit: Payer: Self-pay | Admitting: Physical Medicine and Rehabilitation

## 2023-10-21 DIAGNOSIS — M5416 Radiculopathy, lumbar region: Secondary | ICD-10-CM

## 2023-10-21 DIAGNOSIS — G894 Chronic pain syndrome: Secondary | ICD-10-CM

## 2023-10-24 DIAGNOSIS — Z7951 Long term (current) use of inhaled steroids: Secondary | ICD-10-CM | POA: Diagnosis not present

## 2023-10-24 DIAGNOSIS — Z9981 Dependence on supplemental oxygen: Secondary | ICD-10-CM | POA: Diagnosis not present

## 2023-10-24 DIAGNOSIS — I110 Hypertensive heart disease with heart failure: Secondary | ICD-10-CM | POA: Diagnosis not present

## 2023-10-24 DIAGNOSIS — Z556 Problems related to health literacy: Secondary | ICD-10-CM | POA: Diagnosis not present

## 2023-10-24 DIAGNOSIS — Z7952 Long term (current) use of systemic steroids: Secondary | ICD-10-CM | POA: Diagnosis not present

## 2023-10-24 DIAGNOSIS — G4733 Obstructive sleep apnea (adult) (pediatric): Secondary | ICD-10-CM | POA: Diagnosis not present

## 2023-10-24 DIAGNOSIS — J431 Panlobular emphysema: Secondary | ICD-10-CM | POA: Diagnosis not present

## 2023-10-24 DIAGNOSIS — I5042 Chronic combined systolic (congestive) and diastolic (congestive) heart failure: Secondary | ICD-10-CM | POA: Diagnosis not present

## 2023-10-24 DIAGNOSIS — J9611 Chronic respiratory failure with hypoxia: Secondary | ICD-10-CM | POA: Diagnosis not present

## 2023-10-24 DIAGNOSIS — Z87891 Personal history of nicotine dependence: Secondary | ICD-10-CM | POA: Diagnosis not present

## 2023-10-24 DIAGNOSIS — J9612 Chronic respiratory failure with hypercapnia: Secondary | ICD-10-CM | POA: Diagnosis not present

## 2023-10-24 DIAGNOSIS — I428 Other cardiomyopathies: Secondary | ICD-10-CM | POA: Diagnosis not present

## 2023-10-24 DIAGNOSIS — E785 Hyperlipidemia, unspecified: Secondary | ICD-10-CM | POA: Diagnosis not present

## 2023-10-24 DIAGNOSIS — J441 Chronic obstructive pulmonary disease with (acute) exacerbation: Secondary | ICD-10-CM | POA: Diagnosis not present

## 2023-10-25 ENCOUNTER — Encounter: Payer: Self-pay | Admitting: Physical Medicine & Rehabilitation

## 2023-10-27 DIAGNOSIS — Z556 Problems related to health literacy: Secondary | ICD-10-CM | POA: Diagnosis not present

## 2023-10-27 DIAGNOSIS — Z7952 Long term (current) use of systemic steroids: Secondary | ICD-10-CM | POA: Diagnosis not present

## 2023-10-27 DIAGNOSIS — J441 Chronic obstructive pulmonary disease with (acute) exacerbation: Secondary | ICD-10-CM | POA: Diagnosis not present

## 2023-10-27 DIAGNOSIS — J431 Panlobular emphysema: Secondary | ICD-10-CM | POA: Diagnosis not present

## 2023-10-27 DIAGNOSIS — Z87891 Personal history of nicotine dependence: Secondary | ICD-10-CM | POA: Diagnosis not present

## 2023-10-27 DIAGNOSIS — G4733 Obstructive sleep apnea (adult) (pediatric): Secondary | ICD-10-CM | POA: Diagnosis not present

## 2023-10-27 DIAGNOSIS — J9611 Chronic respiratory failure with hypoxia: Secondary | ICD-10-CM | POA: Diagnosis not present

## 2023-10-27 DIAGNOSIS — Z7951 Long term (current) use of inhaled steroids: Secondary | ICD-10-CM | POA: Diagnosis not present

## 2023-10-27 DIAGNOSIS — I5042 Chronic combined systolic (congestive) and diastolic (congestive) heart failure: Secondary | ICD-10-CM | POA: Diagnosis not present

## 2023-10-27 DIAGNOSIS — Z9981 Dependence on supplemental oxygen: Secondary | ICD-10-CM | POA: Diagnosis not present

## 2023-10-27 DIAGNOSIS — J9612 Chronic respiratory failure with hypercapnia: Secondary | ICD-10-CM | POA: Diagnosis not present

## 2023-10-27 DIAGNOSIS — I110 Hypertensive heart disease with heart failure: Secondary | ICD-10-CM | POA: Diagnosis not present

## 2023-10-27 DIAGNOSIS — E785 Hyperlipidemia, unspecified: Secondary | ICD-10-CM | POA: Diagnosis not present

## 2023-10-27 DIAGNOSIS — I428 Other cardiomyopathies: Secondary | ICD-10-CM | POA: Diagnosis not present

## 2023-10-28 ENCOUNTER — Ambulatory Visit: Payer: Medicare Other | Admitting: Adult Health

## 2023-10-28 DIAGNOSIS — J9610 Chronic respiratory failure, unspecified whether with hypoxia or hypercapnia: Secondary | ICD-10-CM | POA: Diagnosis not present

## 2023-10-28 DIAGNOSIS — J449 Chronic obstructive pulmonary disease, unspecified: Secondary | ICD-10-CM | POA: Diagnosis not present

## 2023-10-29 DIAGNOSIS — J449 Chronic obstructive pulmonary disease, unspecified: Secondary | ICD-10-CM | POA: Diagnosis not present

## 2023-11-03 DIAGNOSIS — J431 Panlobular emphysema: Secondary | ICD-10-CM | POA: Diagnosis not present

## 2023-11-03 DIAGNOSIS — Z87891 Personal history of nicotine dependence: Secondary | ICD-10-CM | POA: Diagnosis not present

## 2023-11-03 DIAGNOSIS — J9611 Chronic respiratory failure with hypoxia: Secondary | ICD-10-CM | POA: Diagnosis not present

## 2023-11-03 DIAGNOSIS — Z7952 Long term (current) use of systemic steroids: Secondary | ICD-10-CM | POA: Diagnosis not present

## 2023-11-03 DIAGNOSIS — I5042 Chronic combined systolic (congestive) and diastolic (congestive) heart failure: Secondary | ICD-10-CM | POA: Diagnosis not present

## 2023-11-03 DIAGNOSIS — J9612 Chronic respiratory failure with hypercapnia: Secondary | ICD-10-CM | POA: Diagnosis not present

## 2023-11-03 DIAGNOSIS — I110 Hypertensive heart disease with heart failure: Secondary | ICD-10-CM | POA: Diagnosis not present

## 2023-11-03 DIAGNOSIS — Z556 Problems related to health literacy: Secondary | ICD-10-CM | POA: Diagnosis not present

## 2023-11-03 DIAGNOSIS — Z7951 Long term (current) use of inhaled steroids: Secondary | ICD-10-CM | POA: Diagnosis not present

## 2023-11-03 DIAGNOSIS — E785 Hyperlipidemia, unspecified: Secondary | ICD-10-CM | POA: Diagnosis not present

## 2023-11-03 DIAGNOSIS — J441 Chronic obstructive pulmonary disease with (acute) exacerbation: Secondary | ICD-10-CM | POA: Diagnosis not present

## 2023-11-03 DIAGNOSIS — Z9981 Dependence on supplemental oxygen: Secondary | ICD-10-CM | POA: Diagnosis not present

## 2023-11-03 DIAGNOSIS — I428 Other cardiomyopathies: Secondary | ICD-10-CM | POA: Diagnosis not present

## 2023-11-03 DIAGNOSIS — G4733 Obstructive sleep apnea (adult) (pediatric): Secondary | ICD-10-CM | POA: Diagnosis not present

## 2023-11-08 ENCOUNTER — Encounter: Payer: Self-pay | Admitting: Internal Medicine

## 2023-11-08 ENCOUNTER — Ambulatory Visit (INDEPENDENT_AMBULATORY_CARE_PROVIDER_SITE_OTHER): Payer: Medicare Other | Admitting: Internal Medicine

## 2023-11-08 VITALS — BP 124/68 | HR 83 | Temp 98.1°F | Resp 16 | Ht 70.0 in | Wt 238.4 lb

## 2023-11-08 DIAGNOSIS — M170 Bilateral primary osteoarthritis of knee: Secondary | ICD-10-CM

## 2023-11-08 DIAGNOSIS — K7030 Alcoholic cirrhosis of liver without ascites: Secondary | ICD-10-CM

## 2023-11-08 DIAGNOSIS — Z125 Encounter for screening for malignant neoplasm of prostate: Secondary | ICD-10-CM

## 2023-11-08 DIAGNOSIS — M1A9XX1 Chronic gout, unspecified, with tophus (tophi): Secondary | ICD-10-CM

## 2023-11-08 DIAGNOSIS — E118 Type 2 diabetes mellitus with unspecified complications: Secondary | ICD-10-CM | POA: Diagnosis not present

## 2023-11-08 DIAGNOSIS — Z1211 Encounter for screening for malignant neoplasm of colon: Secondary | ICD-10-CM | POA: Insufficient documentation

## 2023-11-08 DIAGNOSIS — I10 Essential (primary) hypertension: Secondary | ICD-10-CM | POA: Diagnosis not present

## 2023-11-08 DIAGNOSIS — M1A0221 Idiopathic chronic gout, left elbow, with tophus (tophi): Secondary | ICD-10-CM | POA: Diagnosis not present

## 2023-11-08 DIAGNOSIS — M47816 Spondylosis without myelopathy or radiculopathy, lumbar region: Secondary | ICD-10-CM

## 2023-11-08 NOTE — Progress Notes (Unsigned)
Subjective:  Patient ID: Robert Church, male    DOB: 1956-03-22  Age: 67 y.o. MRN: [id number]  CC: Osteoarthritis, Diabetes, Congestive Heart Failure, and Back Pain   HPI DARIES LARDIERI presents for f/up ----   Lab Results  Component Value Date   WBC 9.3 06/02/2023   HGB 16.4 06/02/2023   HCT 49.1 06/02/2023   PLT 178 06/02/2023   GLUCOSE 154 (H) 09/20/2023   CHOL 221 (H) 03/17/2023   TRIG 196.0 (H) 03/17/2023   HDL 45.50 03/17/2023   LDLDIRECT 157.0 09/15/2022   LDLCALC 136 (H) 03/17/2023   ALT 20 04/05/2023   AST 20 04/05/2023   NA 137 09/20/2023   K 3.9 09/20/2023   CL 96 (L) 09/20/2023   CREATININE 1.04 09/20/2023   BUN 16 09/20/2023   CO2 29 09/20/2023   TSH 0.39 03/17/2023   PSA 3.24 06/29/2022   INR 1.0 04/05/2023   HGBA1C 6.0 05/27/2023   MICROALBUR <0.7 09/15/2022     Outpatient Medications Prior to Visit  Medication Sig Dispense Refill   albuterol (PROVENTIL) (2.5 MG/3ML) 0.083% nebulizer solution TAKE 3 MLS BY NEBULIZATION EVERY 4 HOURS AS NEEDED FOR WHEEZING OR SHORTNESS OF BREATH 150 mL 11   allopurinol (ZYLOPRIM) 300 MG tablet Take 1 tablet (300 mg total) by mouth daily. 90 tablet 1   ALPRAZolam (XANAX) 0.5 MG tablet TAKE 1 TABLET(0.5 MG) BY MOUTH THREE TIMES DAILY 270 tablet 0   DALIRESP 500 MCG TABS tablet TAKE 1 TABLET(500 MCG) BY MOUTH DAILY 30 tablet 2   diclofenac Sodium (VOLTAREN) 1 % GEL Apply 1 Application topically in the morning and at bedtime.     EPINEPHrine 0.3 mg/0.3 mL IJ SOAJ injection Inject 0.3 mg into the muscle as needed for anaphylaxis. 1 each 0   fluticasone (FLONASE) 50 MCG/ACT nasal spray SHAKE LIQUID AND USE 2 SPRAYS IN EACH NOSTRIL DAILY (Patient taking differently: Place 1 spray into both nostrils daily as needed for allergies.) 16 g 2   guaiFENesin (MUCINEX) 600 MG 12 hr tablet Take 1 tablet (600 mg total) by mouth 2 (two) times daily. (Patient taking differently: Take 600 mg by mouth 2 (two) times daily as needed for cough or  to loosen phlegm.) 60 tablet 5   ipratropium (ATROVENT) 0.06 % nasal spray Place 2 sprays into both nostrils 4 (four) times daily as needed for rhinitis. 45 mL 3   ipratropium-albuterol (DUONEB) 0.5-2.5 (3) MG/3ML SOLN Take 3 mLs by nebulization every 4 (four) hours as needed. 360 mL 5   Lidocaine, Anorectal, 5 % CREA Apply 1 application topically 2 (two) times daily. (Patient taking differently: Apply 1 application  topically daily as needed (Fissure).) 30 g 1   loratadine (CLARITIN) 10 MG tablet Take 10 mg by mouth every other day.     losartan (COZAAR) 25 MG tablet Take 1 tablet (25 mg total) by mouth daily. (Patient taking differently: Take 25 mg by mouth as needed.) 90 tablet 3   metolazone (ZAROXOLYN) 2.5 MG tablet Take 1 tablet (2.5 mg total) by mouth as directed. 5 tablet 6   mometasone-formoterol (DULERA) 100-5 MCG/ACT AERO Inhale 2 puffs into the lungs 2 (two) times daily.     nicotine (NICODERM CQ - DOSED IN MG/24 HOURS) 14 mg/24hr patch Place 1 patch (14 mg total) onto the skin daily. 28 patch 0   omeprazole (PRILOSEC) 20 MG capsule TAKE 1 CAPSULE(20 MG) BY MOUTH TWICE DAILY BEFORE A MEAL FOR ACID REFLUX 180 capsule 0  potassium chloride SA (KLOR-CON M20) 20 MEQ tablet Take 1 tablet (20 mEq total) by mouth daily. Take an extra 40 meq when you take metolazone 90 tablet 3   predniSONE (DELTASONE) 20 MG tablet Take 1 tablet (20 mg total) by mouth daily with breakfast. 5 tablet 0   Testosterone 20.25 MG/1.25GM (1.62%) GEL Place 1.25 g onto the skin daily. (Patient taking differently: Place 1 Pump onto the skin every other day.) 112.5 g 0   Tiotropium Bromide Monohydrate (SPIRIVA RESPIMAT) 2.5 MCG/ACT AERS Inhale 2 puffs into the lungs daily. 4 g 6   torsemide (DEMADEX) 20 MG tablet Take 2 tablets (40 mg total) by mouth 2 (two) times daily. (Patient taking differently: Take 20 mg by mouth daily. Take 2 tablets (40mg ) by mouth  daily) 120 tablet 1   VENTOLIN HFA 108 (90 Base) MCG/ACT inhaler  INHALE 2 PUFFS INTO THE LUNGS EVERY 6 HOURS AS NEEDED FOR WHEEZING 18 g 4   vitamin B-12 (CYANOCOBALAMIN) 1000 MCG tablet Take 1,000 mcg by mouth daily.     AMBULATORY NON FORMULARY MEDICATION Medication Name: Nitroglycerin gel 0.125% apply 2-3 times daily to the anal canal. (Patient taking differently: Place 1 Application rectally daily as needed (Fissure). Medication Name: Nitroglycerin gel 0.125% apply 2-3 times daily to the anal canal.) 30 g 1   lidocaine (XYLOCAINE) 5 % ointment Apply topically 3 (three) times daily as needed. 35.44 g 1   nystatin (MYCOSTATIN) 100000 UNIT/ML suspension Take 5 mLs (500,000 Units total) by mouth in the morning, at noon, and at bedtime. 45 mL 0   No facility-administered medications prior to visit.    ROS Review of Systems  Constitutional:  Positive for fatigue. Negative for appetite change, diaphoresis and unexpected weight change.  HENT: Negative.    Eyes:  Negative for visual disturbance.  Respiratory:  Positive for cough and shortness of breath. Negative for chest tightness and wheezing.   Cardiovascular:  Negative for chest pain, palpitations and leg swelling.  Gastrointestinal: Negative.  Negative for abdominal pain, constipation, diarrhea, nausea and vomiting.  Endocrine: Negative.   Genitourinary: Negative.  Negative for difficulty urinating.  Musculoskeletal:  Positive for arthralgias, back pain and gait problem. Negative for myalgias.  Skin: Negative.   Neurological:  Negative for dizziness, weakness and numbness.  Hematological:  Negative for adenopathy. Does not bruise/bleed easily.  Psychiatric/Behavioral:  Positive for confusion and decreased concentration. Negative for agitation, behavioral problems, self-injury and sleep disturbance. The patient is nervous/anxious.        Objective:  BP 124/68 (BP Location: Left Arm, Patient Position: Sitting, Cuff Size: Normal)   Pulse 83   Temp 98.1 F (36.7 C) (Oral)   Resp 16   Ht 5\' 10"   (1.778 m)   Wt 238 lb 6.4 oz (108.1 kg)   SpO2 93%   BMI 34.21 kg/m   BP Readings from Last 3 Encounters:  11/08/23 124/68  09/20/23 110/80  09/16/23 110/70    Wt Readings from Last 3 Encounters:  11/08/23 238 lb 6.4 oz (108.1 kg)  09/20/23 234 lb (106.1 kg)  09/16/23 234 lb (106.1 kg)    Physical Exam  Lab Results  Component Value Date   WBC 9.3 06/02/2023   HGB 16.4 06/02/2023   HCT 49.1 06/02/2023   PLT 178 06/02/2023   GLUCOSE 154 (H) 09/20/2023   CHOL 221 (H) 03/17/2023   TRIG 196.0 (H) 03/17/2023   HDL 45.50 03/17/2023   LDLDIRECT 157.0 09/15/2022   LDLCALC 136 (H) 03/17/2023  ALT 20 04/05/2023   AST 20 04/05/2023   NA 137 09/20/2023   K 3.9 09/20/2023   CL 96 (L) 09/20/2023   CREATININE 1.04 09/20/2023   BUN 16 09/20/2023   CO2 29 09/20/2023   TSH 0.39 03/17/2023   PSA 3.24 06/29/2022   INR 1.0 04/05/2023   HGBA1C 6.0 05/27/2023   MICROALBUR <0.7 09/15/2022    No results found.  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  Sanda Linger, MD

## 2023-11-08 NOTE — Patient Instructions (Signed)
Gout  Gout is a condition that causes painful swelling of the joints. Gout is a type of inflammation of the joints (arthritis). This condition is caused by having too much uric acid in the body. Uric acid is a chemical that forms when the body breaks down substances called purines. Purines are important for building body proteins. When the body has too much uric acid, sharp crystals can form and build up inside the joints. This causes pain and swelling. Gout attacks can happen quickly and may be very painful (acute gout). Over time, the attacks can affect more joints and become more frequent (chronic gout). Gout can also cause uric acid to build up under the skin and inside the kidneys. What are the causes? This condition is caused by too much uric acid in your blood. This can happen because: Your kidneys do not remove enough uric acid from your blood. This is the most common cause. Your body makes too much uric acid. This can happen with some cancers and cancer treatments. It can also occur if your body is breaking down too many red blood cells (hemolytic anemia). You eat too many foods that are high in purines. These foods include organ meats and some seafood. Alcohol, especially beer, is also high in purines. A gout attack may be triggered by trauma or stress. What increases the risk? The following factors may make you more likely to develop this condition: Having a family history of gout. Being male and middle-aged. Being male and having gone through menopause. Taking certain medicines, including aspirin, cyclosporine, diuretics, levodopa, and niacin. Having an organ transplant. Having certain conditions, such as: Being obese. Lead poisoning. Kidney disease. A skin condition called psoriasis. Other factors include: Losing weight too quickly. Being dehydrated. Frequently drinking alcohol, especially beer. Frequently drinking beverages that are sweetened with a type of sugar called  fructose. What are the signs or symptoms? An attack of acute gout happens quickly. It usually occurs in just one joint. The most common place is the big toe. Attacks often start at night. Other joints that may be affected include joints of the feet, ankle, knee, fingers, wrist, or elbow. Symptoms of this condition may include: Severe pain. Warmth. Swelling. Stiffness. Tenderness. The affected joint may be very painful to touch. Shiny, red, or purple skin. Chills and fever. Chronic gout may cause symptoms more frequently. More joints may be involved. You may also have white or yellow lumps (tophi) on your hands or feet or in other areas near your joints. How is this diagnosed? This condition is diagnosed based on your symptoms, your medical history, and a physical exam. You may have tests, such as: Blood tests to measure uric acid levels. Removal of joint fluid with a thin needle (aspiration) to look for uric acid crystals. X-rays to look for joint damage. How is this treated? Treatment for this condition has two phases: treating an acute attack and preventing future attacks. Acute gout treatment may include medicines to reduce pain and swelling, including: NSAIDs, such as ibuprofen. Steroids. These are strong anti-inflammatory medicines that can be taken by mouth (orally) or injected into a joint. Colchicine. This medicine relieves pain and swelling when it is taken soon after an attack. It can be given by mouth or through an IV. Preventive treatment may include: Daily use of smaller doses of NSAIDs or colchicine. Use of a medicine that reduces uric acid levels in your blood, such as allopurinol. Changes to your diet. You may need to see   a dietitian about what to eat and drink to prevent gout. Follow these instructions at home: During a gout attack  If directed, put ice on the affected area. To do this: Put ice in a plastic bag. Place a towel between your skin and the bag. Leave the  ice on for 20 minutes, 2-3 times a day. Remove the ice if your skin turns bright red. This is very important. If you cannot feel pain, heat, or cold, you have a greater risk of damage to the area. Raise (elevate) the affected joint above the level of your heart as often as possible. Rest the joint as much as possible. If the affected joint is in your leg, you may be given crutches to use. Follow instructions from your health care provider about eating or drinking restrictions. Avoiding future gout attacks Follow a low-purine diet as told by your dietitian or health care provider. Avoid foods and drinks that are high in purines, including liver, kidney, anchovies, asparagus, herring, mushrooms, mussels, and beer. Maintain a healthy weight or lose weight if you are overweight. If you want to lose weight, talk with your health care provider. Do not lose weight too quickly. Start or maintain an exercise program as told by your health care provider. Eating and drinking Avoid drinking beverages that contain fructose. Drink enough fluids to keep your urine pale yellow. If you drink alcohol: Limit how much you have to: 0-1 drink a day for women who are not pregnant. 0-2 drinks a day for men. Know how much alcohol is in a drink. In the [place] one drink equals one 12 oz bottle of beer (355 mL), one 5 oz glass of wine (148 mL), or one 1 oz glass of hard liquor (44 mL). General instructions Take over-the-counter and prescription medicines only as told by your health care provider. Ask your health care provider if the medicine prescribed to you requires you to avoid driving or using machinery. Return to your normal activities as told by your health care provider. Ask your health care provider what activities are safe for you. Keep all follow-up visits. This is important. Where to find more information [employer] of [employer]: www.niams.nih.gov Contact a health care provider if you have: Another  gout attack. Continuing symptoms of a gout attack after 10 days of treatment. Side effects from your medicines. Chills or a fever. Burning pain when you urinate. Pain in your lower back or abdomen. Get help right away if you: Have severe or uncontrolled pain. Cannot urinate. Summary Gout is painful swelling of the joints caused by having too much uric acid in the body. The most common site for gout to occur is in the big toe, but it can affect other joints in the body. Medicines and dietary changes can help to prevent and treat gout attacks. This information is not intended to replace advice given to you by your health care provider. Make sure you discuss any questions you have with your health care provider. Document Revised: 09/10/2021 Document Reviewed: 09/10/2021 Elsevier Patient Education  2024 [employer]  

## 2023-11-09 ENCOUNTER — Telehealth: Payer: Self-pay | Admitting: Internal Medicine

## 2023-11-09 LAB — CBC WITH DIFFERENTIAL/PLATELET
Basophils Absolute: 0 10*3/uL (ref 0.0–0.1)
Basophils Relative: 0.3 % (ref 0.0–3.0)
Eosinophils Absolute: 0 10*3/uL (ref 0.0–0.7)
Eosinophils Relative: 0.2 % (ref 0.0–5.0)
HCT: 45.8 % (ref 39.0–52.0)
Hemoglobin: 15.2 g/dL (ref 13.0–17.0)
Lymphocytes Relative: 8.2 % — ABNORMAL LOW (ref 12.0–46.0)
Lymphs Abs: 0.7 10*3/uL (ref 0.7–4.0)
MCHC: 33.3 g/dL (ref 30.0–36.0)
MCV: 97.8 fL (ref 78.0–100.0)
Monocytes Absolute: 0.4 10*3/uL (ref 0.1–1.0)
Monocytes Relative: 5.2 % (ref 3.0–12.0)
Neutro Abs: 7.2 10*3/uL (ref 1.4–7.7)
Neutrophils Relative %: 86.1 % — ABNORMAL HIGH (ref 43.0–77.0)
Platelets: 211 10*3/uL (ref 150.0–400.0)
RBC: 4.68 Mil/uL (ref 4.22–5.81)
RDW: 13.2 % (ref 11.5–15.5)
WBC: 8.4 10*3/uL (ref 4.0–10.5)

## 2023-11-09 LAB — HEPATIC FUNCTION PANEL
ALT: 14 U/L (ref 0–53)
AST: 16 U/L (ref 0–37)
Albumin: 4.1 g/dL (ref 3.5–5.2)
Alkaline Phosphatase: 59 U/L (ref 39–117)
Bilirubin, Direct: 0.1 mg/dL (ref 0.0–0.3)
Total Bilirubin: 0.6 mg/dL (ref 0.2–1.2)
Total Protein: 6.2 g/dL (ref 6.0–8.3)

## 2023-11-09 LAB — PROTIME-INR
INR: 1 {ratio} (ref 0.8–1.0)
Prothrombin Time: 10.8 s (ref 9.6–13.1)

## 2023-11-09 LAB — HEMOGLOBIN A1C: Hgb A1c MFr Bld: 6.7 % — ABNORMAL HIGH (ref 4.6–6.5)

## 2023-11-09 LAB — PSA: PSA: 3.62 ng/mL (ref 0.10–4.00)

## 2023-11-09 LAB — URIC ACID: Uric Acid, Serum: 12 mg/dL — ABNORMAL HIGH (ref 4.0–7.8)

## 2023-11-09 NOTE — Telephone Encounter (Signed)
Noted  

## 2023-11-09 NOTE — Telephone Encounter (Signed)
Pt called stating he suppose to had went and got his labs done yesterday but the lab closed early. Pt wanted Dr. Yetta Barre to know he will get his labs done tomorrow afternoon.

## 2023-11-10 DIAGNOSIS — Z7951 Long term (current) use of inhaled steroids: Secondary | ICD-10-CM | POA: Diagnosis not present

## 2023-11-10 DIAGNOSIS — Z9981 Dependence on supplemental oxygen: Secondary | ICD-10-CM | POA: Diagnosis not present

## 2023-11-10 DIAGNOSIS — E119 Type 2 diabetes mellitus without complications: Secondary | ICD-10-CM | POA: Insufficient documentation

## 2023-11-10 DIAGNOSIS — G4733 Obstructive sleep apnea (adult) (pediatric): Secondary | ICD-10-CM | POA: Diagnosis not present

## 2023-11-10 DIAGNOSIS — J9612 Chronic respiratory failure with hypercapnia: Secondary | ICD-10-CM | POA: Diagnosis not present

## 2023-11-10 DIAGNOSIS — Z556 Problems related to health literacy: Secondary | ICD-10-CM | POA: Diagnosis not present

## 2023-11-10 DIAGNOSIS — J9611 Chronic respiratory failure with hypoxia: Secondary | ICD-10-CM | POA: Diagnosis not present

## 2023-11-10 DIAGNOSIS — I110 Hypertensive heart disease with heart failure: Secondary | ICD-10-CM | POA: Diagnosis not present

## 2023-11-10 DIAGNOSIS — Z87891 Personal history of nicotine dependence: Secondary | ICD-10-CM | POA: Diagnosis not present

## 2023-11-10 DIAGNOSIS — J431 Panlobular emphysema: Secondary | ICD-10-CM | POA: Diagnosis not present

## 2023-11-10 DIAGNOSIS — I428 Other cardiomyopathies: Secondary | ICD-10-CM | POA: Diagnosis not present

## 2023-11-10 DIAGNOSIS — E785 Hyperlipidemia, unspecified: Secondary | ICD-10-CM | POA: Diagnosis not present

## 2023-11-10 DIAGNOSIS — J441 Chronic obstructive pulmonary disease with (acute) exacerbation: Secondary | ICD-10-CM | POA: Diagnosis not present

## 2023-11-10 DIAGNOSIS — Z7952 Long term (current) use of systemic steroids: Secondary | ICD-10-CM | POA: Diagnosis not present

## 2023-11-10 DIAGNOSIS — M1A9XX1 Chronic gout, unspecified, with tophus (tophi): Secondary | ICD-10-CM | POA: Insufficient documentation

## 2023-11-10 DIAGNOSIS — I5042 Chronic combined systolic (congestive) and diastolic (congestive) heart failure: Secondary | ICD-10-CM | POA: Diagnosis not present

## 2023-11-10 LAB — MICROALBUMIN / CREATININE URINE RATIO
Creatinine,U: 15.4 mg/dL
Microalb Creat Ratio: 4.5 mg/g (ref 0.0–30.0)
Microalb, Ur: <0.7 mg/dL (ref 0.0–1.9)

## 2023-11-10 MED ORDER — ALLOPURINOL 300 MG PO TABS: 300.0000 mg | ORAL_TABLET | Freq: Every day | ORAL | 0 refills | Status: DC

## 2023-11-11 ENCOUNTER — Encounter: Payer: Self-pay | Admitting: Physical Medicine & Rehabilitation

## 2023-11-11 ENCOUNTER — Encounter: Payer: Medicare Other | Attending: Physical Medicine & Rehabilitation | Admitting: Physical Medicine & Rehabilitation

## 2023-11-11 VITALS — BP 127/84 | HR 84 | Ht 70.0 in | Wt 240.0 lb

## 2023-11-11 DIAGNOSIS — M545 Low back pain, unspecified: Secondary | ICD-10-CM | POA: Diagnosis not present

## 2023-11-11 DIAGNOSIS — R200 Anesthesia of skin: Secondary | ICD-10-CM | POA: Insufficient documentation

## 2023-11-11 DIAGNOSIS — G629 Polyneuropathy, unspecified: Secondary | ICD-10-CM | POA: Diagnosis not present

## 2023-11-11 DIAGNOSIS — M170 Bilateral primary osteoarthritis of knee: Secondary | ICD-10-CM | POA: Diagnosis not present

## 2023-11-11 DIAGNOSIS — M1A0790 Idiopathic chronic gout, unspecified ankle and foot, without tophus (tophi): Secondary | ICD-10-CM | POA: Diagnosis not present

## 2023-11-11 DIAGNOSIS — G8929 Other chronic pain: Secondary | ICD-10-CM | POA: Diagnosis not present

## 2023-11-11 LAB — URINALYSIS, ROUTINE W REFLEX MICROSCOPIC
Bilirubin Urine: NEGATIVE
Hgb urine dipstick: NEGATIVE
Ketones, ur: NEGATIVE
Leukocytes,Ua: NEGATIVE
Nitrite: NEGATIVE
RBC / HPF: NONE SEEN (ref 0–?)
Specific Gravity, Urine: 1.01 (ref 1.000–1.030)
Total Protein, Urine: NEGATIVE
Urine Glucose: NEGATIVE
Urobilinogen, UA: 0.2 (ref 0.0–1.0)
WBC, UA: NONE SEEN (ref 0–?)
pH: 6.5 (ref 5.0–8.0)

## 2023-11-11 MED ORDER — GABAPENTIN 100 MG PO CAPS: 100.0000 mg | ORAL_CAPSULE | Freq: Three times a day (TID) | ORAL | 3 refills | Status: DC

## 2023-11-11 NOTE — Progress Notes (Signed)
Subjective:    Patient ID: Robert Church, male    DOB: [birth year], 67 y.o.   MRN: [id number]  HPI     HPI  Robert Church is a 67 y.o. 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   They are presenting to PM&R clinic as a new patient for pain management evaluation. They were referred by Ellin Goodie for treatment of lumbar radiculopathy pain.  Patient reports he has had pain issues since he had several falls about 4 years ago.  He reports his worst pain is along his plantar and dorsal feet bilaterally.  This is a pins and needle sensation.  It hurts to put weight on his feet in the morning.  His second worst pain is in his bilateral knees.  He reports he is not having knee pain at rest but this occurs with ambulation.  He also has pain in his lower back.  Lower back pain is worsened with standing and with activity.  He reports his pain is doing little better recently and is not his greatest source of pain at this time.  He also reports having issues with gout.  Reports recent flare in his right hand, prior flare in his left great toe.  He further more reports numbness in his fourth and fifth digits when he wakes up at night bilaterally.  He does not have this symptom during the day.  He typically uses IcyHot and BC powder to help his pain.  He reports he was previously seen by orthopedics and had injections by Dr. Alvester Morin.  He says he was also seen by Dr. Horald Chestnut previously.  Seen by [facility] in the past, appears they were planning to get a CT  myelogram L-spine and C-spine previously. Pt not interested in doing this.   Patient says he is prediabetes, although it appears he now may have diabetes and  A1c 6.7.   Red flag symptoms: No red flags for back pain endorsed in Hx or ROS  Medications tried: Topical medications IcyHot Nsaids Ibuprofen- helps, BC powder helps.  He does not use multiple NSAIDs together Tylenol - doesn't use due to liver disease Opiates denies Gabapentin -reports he was ordered this but never given to try TCAs - denies SNRIs  denies   Other treatments: PT- denies  TENs unit denies Injections back and neck injections by Dr. Horald Chestnut and Dr. Alvester Morin in the past-reports this helped his prior sciatica.  He says it also helped his lower back pain for a week or 2 but did not last Injections for b/l Knee OA- doesn't remember how this worked.  Appears he also had Orthovisc injection in the past for his knees-he thinks this may have caused a gout flare    Prior UDS results: No results found for: "LABOPIA", "COCAINSCRNUR", "LABBENZ", "AMPHETMU", "THCU", "LABBARB"     Pain Inventory Average Pain 8 Pain Right Now 6 My pain is sharp and aching  In the last 24 hours, has pain interfered with the following? General activity 8 Relation with others 8 Enjoyment of life 8 What TIME of day is your pain at its worst? morning , daytime, and evening Sleep (  in general) Fair  Pain is worse with: walking and standing Pain improves with: medication Relief from Meds: 5  use a cane how many minutes can you walk? 5-10 ability to climb steps?  no do you drive?  yes  retired I need assistance with the following:  household duties and shopping  trouble walking anxiety  New pt  New pt    Family History  Problem Relation Age of Onset   Lung cancer Father    High blood pressure Mother    Diabetes Maternal Grandmother    Colon polyps Neg Hx    Esophageal cancer Neg Hx    Pancreatic cancer Neg Hx    Stomach  cancer Neg Hx    Social History   Socioeconomic History   Marital status: Single    Spouse name: Not on file   Number of children: 1   Years of education: 12   Highest education level: 12th grade  Occupational History   Occupation: disabled  Tobacco Use   Smoking status: Former    Current packs/day: 0.00    Average packs/day: 2.0 packs/day for 46.0 years (92.0 ttl pk-yrs)    Types: Cigarettes    Start date: 01/28/1976    Quit date: 01/27/2022    Years since quitting: 1.7    Passive exposure: Past   Smokeless tobacco: Never   Tobacco comments:    Chews on a cigar.  09/16/2023 hfb  Vaping Use   Vaping status: Never Used  Substance and Sexual Activity   Alcohol use: Not Currently    Alcohol/week: 28.0 standard drinks of alcohol    Types: 28 Cans of beer per week   Drug use: No   Sexual activity: Yes    Partners: Female    Birth control/protection: None  Other Topics Concern   Not on file  Social History Narrative   Right handed   Tea sometimes and coffee (1/2 caffeine)   Social Determinants of Health   Financial Resource Strain: High Risk (11/04/2023)   Overall Financial Resource Strain (CARDIA)    Difficulty of Paying Living Expenses: Hard  Food Insecurity: Food Insecurity Present (11/04/2023)   Hunger Vital Sign    Worried About Running Out of Food in the Last Year: Sometimes true    Ran Out of Food in the Last Year: Sometimes true  Transportation Needs: No Transportation Needs (11/04/2023)   PRAPARE - Administrator, Civil Service (Medical): No    Lack of Transportation (Non-Medical): No  Physical Activity: Unknown (11/04/2023)   Exercise Vital Sign    Days of Exercise per Week: 0 days    Minutes of Exercise per Session: Not on file  Stress: Stress Concern Present (11/04/2023)   [employer] of Occupational Health - Occupational Stress Questionnaire    Feeling of Stress : Rather much  Social Connections: Socially Isolated (11/04/2023)   Social  Connection and Isolation Panel [NHANES]    Frequency of Communication with Friends and Family: More than three times a week    Frequency of Social Gatherings with Friends and Family: Once a week    Attends Religious Services: Never    Database administrator or Organizations: No    Attends Engineer, structural: Not on file    Marital Status: Divorced   Past Surgical History:  Procedure Laterality Date   BIV UPGRADE N/A 10/07/2021   Procedure: BIV PPM UPGRADE;  Surgeon: Regan Lemming, MD;  Location: [facility] INVASIVE CV LAB;  Service:  Cardiovascular;  Laterality: N/A;   BIV UPGRADE N/A 11/11/2022   Procedure: BIV PPM UPGRADE;  Surgeon: Regan Lemming, MD;  Location: [facility] INVASIVE CV LAB;  Service: Cardiovascular;  Laterality: N/A;   LEFT HEART CATH AND CORONARY ANGIOGRAPHY N/A 04/19/2017   Procedure: Left Heart Cath and Coronary Angiography;  Surgeon: Lyn Records, MD;  Location: [facility] INVASIVE CV LAB;  Service: Cardiovascular;  Laterality: N/A;   PACEMAKER IMPLANT N/A 04/16/2021   Procedure: PACEMAKER IMPLANT;  Surgeon: Regan Lemming, MD;  Location: [facility] INVASIVE CV LAB;  Service: Cardiovascular;  Laterality: N/A;   RIGHT HEART CATH N/A 03/09/2023   Procedure: RIGHT HEART CATH;  Surgeon: Dolores Patty, MD;  Location: [facility] INVASIVE CV LAB;  Service: Cardiovascular;  Laterality: N/A;   RIGHT/LEFT HEART CATH AND CORONARY ANGIOGRAPHY N/A 08/22/2021   Procedure: RIGHT/LEFT HEART CATH AND CORONARY ANGIOGRAPHY;  Surgeon: Dolores Patty, MD;  Location: [facility] INVASIVE CV LAB;  Service: Cardiovascular;  Laterality: N/A;   TONSILLECTOMY     Past Medical History:  Diagnosis Date   Adenomatous colon polyp    Allergy    Anal fissure    Arthritis    Asthma    Bronchitis    CHF (congestive heart failure) (HCC)    COPD, group D, by GOLD 2017 classification (HCC)    Emphysema lung (HCC)    GERD (gastroesophageal reflux disease)    History of hiatal hernia    Hyperlipidemia     Hypertension    NICM (nonischemic cardiomyopathy) (HCC)    OSA (obstructive sleep apnea)    Pneumonia    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   Requires supplemental oxygen    BP 127/84   Pulse 84   Ht 5\' 10"  (1.778 m)   Wt 240 lb (108.9 kg)   SpO2 92%   BMI 34.44 kg/m   Opioid Risk Score:   Fall Risk Score:  `1  Depression screen PHQ 2/9     11/11/2023    1:41 PM 05/20/2023    3:37 PM 05/04/2023    2:35 PM 03/17/2023    3:22 PM 06/05/2022    2:20 PM 04/09/2021    1:44 PM 07/08/2020    1:38 PM  Depression screen PHQ 2/9  Decreased Interest 2 0 0 0 1 0 0  Down, Depressed, Hopeless 0 0 0 0 0 0 0  PHQ - 2 Score 2 0 0 0 1 0 0  Altered sleeping 2    1    Tired, decreased energy 2    3    Change in appetite 0    2    Feeling bad or failure about yourself  0    0    Trouble concentrating 0    0    Moving slowly or fidgety/restless 2    0    Suicidal thoughts 0    0    PHQ-9 Score 8    7    Difficult doing work/chores Somewhat difficult           Review of Systems  Respiratory:  Positive for apnea, cough, shortness of breath and wheezing.   Gastrointestinal:  Positive for abdominal pain.  Musculoskeletal:  Positive for back pain and gait problem.       Bilateral shoulder pain Bilateral lower leg pain  Psychiatric/Behavioral:  The patient is nervous/anxious.   All other systems reviewed and are negative.     Objective:   Physical Exam  Gen: no distress, normal appearing HEENT: oral mucosa pink and moist, NCAT Chest: normal effort, normal rate of breathing Abd: soft, non-distended Ext: no edema Psych: pleasant, normal affect Skin: intact Neuro: Alert and awake, follows commands, cranial nerves II through XII grossly intact, normal speech and language RUE: 5/5 Deltoid, 5/5 Biceps, 5/5 Triceps, 5/5 Wrist Ext, 5/5 Grip LUE: 5/5 Deltoid, 5/5 Biceps, 5/5 Triceps, 5/5 Wrist Ext, 5/5 Grip RLE: 4+ to 5 out of 5 limited by pain LLE: 4+ to 5 out of 5 limited  by pain Altered sensation reported in his bilateral lower extremities in stocking glove type distribution No limb ataxia or cerebellar signs. No abnormal tone appreciated.  Hyperreflexive DTR bilateral ankles and knees Tinel's negative at bilateral wrist and elbows Musculoskeletal:  Mild lumbar paraspinal tenderness SLR negative-tight hamstrings bilaterally Facet loading negative Tenderness over lower sacrum/coccyx Wide-based gait, appears a little unsteady Patient has a hard time getting up from a seated position and requires use of his arms  L-spine MRI 11/11/2023 FINDINGS: Segmentation: 5 lumbar type vertebrae.   Alignment: Grade 1 retrolisthesis at L2-3 and L3-4.   Vertebrae: No acute fracture or focal pathologic process.   Paraspinal and other soft tissues: Calcific aortic atherosclerosis.   Disc levels:   L1-2: Unremarkable.   L2-3: Unremarkable.   L3-4: Mild disc bulge without spinal canal or neural foraminal stenosis.   L4-5: Moderate facet hypertrophy with intermediate sized disc bulge and mild narrowing of the spinal canal. No neural impingement.   L5-S1: Right asymmetric disc bulge and endplate spurring with unchanged right subarticular disc protrusion. Unchanged severe right neural foraminal stenosis.   IMPRESSION: 1. No acute fracture or static subluxation of the lumbar spine. 2. Unchanged right asymmetric disc bulge and endplate spurring at L5-S1 with unchanged right subarticular disc protrusion and severe right neural foraminal stenosis.   Knee xray Right 2023 Films of the right knee obtained in 3 projections standing.  There is  narrowing of the medial joint space associated with subchondral sclerosis  and peripheral osteophytes.  There is some osteophyte formation on the  lateral compartment as well.  Patella tracks laterally.  Films are  consistent with moderate osteoarthritis   Knee xray Left 2023 Films of the left knee were obtained in 3  projections standing and  compared to the right knee.  Findings are fairly similar with the last  narrowing of the medial joint space but otherwise consistent with moderate  osteoarthritis.  No acute changes.  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  Recent CT scan with DDD L3-4 L4-5, severe right neuroforaminal stenosis L5-S1  -Has been able able to get MRIs due to claustrophobia.  He also has a pacemaker but it sounds like this may be MRI compatible. 4) Liver cirrhosis 5) History of alcohol abuse 6) Coccyx pain after a fall 7) Gout.  Appears he may have had a gout flare from Orthovisc injection performed by [facility].  Has been restarted on allopurinol by PCP. 8) Altered sensation fourth and fifth digits bilateral hands at night.  Likely cervical versus cubital tunnel origin.  -Consider EMG if this worsens  -Avoid alcohol use, patient reports he uses this very infrequently now.  Prior frequent use -Will start gabapentin 100 mg 3 times daily, discussed that he could call in  a few weeks and we could consider increase of dose -Patient reports cardiology has told him not to get TENS unit due to pacemaker Liver disease- cirrhosis ,  -Discussed foods that can be helpful for pain.  He says he uses turmeric and tart cherry juice already -Discussed referral to PT for balance and back pain- he declines today but will consider at a later visit -Will schedule for knee injections next visit with cortisone -Discussed trying cubital tunnel/ulnar nerve splint at night  --Discussed Qutenza as an option for neuropathic pain control. Discussed that this is a capsaicin patch, stronger than capsaicin cream. Discussed that it is currently approved for diabetic peripheral neuropathy and  post-herpetic neuralgia, but that it has also shown benefit in treating other forms of neuropathy. Provided patient with link to site to learn more about the patch: https://www.clark.biz/. Discussed that the patch would be placed in office and benefits usually last 3 months. Discussed that unintended exposure to capsaicin can cause severe irritation of eyes, mucous membranes, respiratory tract, and skin, but that Qutenza is a local treatment and does not have the systemic side effects of other nerve medications. Discussed that there may be pain, itching, erythema, and decreased sensory function associated with the application of Qutenza. Side effects usually subside within 1 week. A cold pack of analgesic medications can help with these side effects. Blood pressure can also be increased due to pain associated with administration of the patch.

## 2023-11-12 ENCOUNTER — Telehealth: Payer: Self-pay

## 2023-11-12 NOTE — Progress Notes (Signed)
   Care Guide Note  11/12/2023 Name: [name] MRN: [id number] DOB: July 18, 1956  Referred by: [name], MD Reason for referral : Care Coordination (Outreach to schedule with Pharm d )   Robert Church is a 67 y.o. year old male who is a primary care patient of [name], MD. HESTER SUNSHINE was referred to the pharmacist for assistance related to HTN and COPD.    Successful contact was made with the patient to discuss pharmacy services including being ready for the pharmacist to call at least 5 minutes before the scheduled appointment time, to have medication bottles and any blood sugar or blood pressure readings ready for review. The patient agreed to meet with the pharmacist via with the pharmacist via telephone visit on (date/time).  11/24/2023  Penne Lash , RMA       [facility], [facility] Guide  Direct Dial: [phone]  Website: [email or web address]

## 2023-11-12 NOTE — Addendum Note (Signed)
Addended by: [name] on: 11/12/2023 01:47 PM   Modules accepted: Orders

## 2023-11-13 ENCOUNTER — Telehealth (HOSPITAL_BASED_OUTPATIENT_CLINIC_OR_DEPARTMENT_OTHER): Payer: Self-pay | Admitting: Pulmonary Disease

## 2023-11-13 MED ORDER — MOMETASONE FURO-FORMOTEROL FUM 100-5 MCG/ACT IN AERO: 2.0000 | INHALATION_SPRAY | Freq: Two times a day (BID) | RESPIRATORY_TRACT | 5 refills | Status: DC

## 2023-11-13 NOTE — Telephone Encounter (Signed)
[facility] Telephone After Hours Encounter  Patient needs refills for [facility]. Advair was not effective. Dulera with refill x 5 placed to preferred pharmacy. Closing encounter.

## 2023-11-15 ENCOUNTER — Ambulatory Visit: Payer: Medicare Other | Admitting: Adult Health

## 2023-11-15 ENCOUNTER — Encounter: Payer: Self-pay | Admitting: Adult Health

## 2023-11-15 VITALS — BP 128/84 | HR 86 | Temp 98.0°F | Ht 70.0 in | Wt 235.8 lb

## 2023-11-15 DIAGNOSIS — J449 Chronic obstructive pulmonary disease, unspecified: Secondary | ICD-10-CM

## 2023-11-15 DIAGNOSIS — I5042 Chronic combined systolic (congestive) and diastolic (congestive) heart failure: Secondary | ICD-10-CM | POA: Diagnosis not present

## 2023-11-15 DIAGNOSIS — J9611 Chronic respiratory failure with hypoxia: Secondary | ICD-10-CM

## 2023-11-15 DIAGNOSIS — R5381 Other malaise: Secondary | ICD-10-CM

## 2023-11-15 NOTE — Patient Instructions (Addendum)
Continue on Dulera 2 puffs Twice daily, rinse after use.  Continue Spiriva 2 puffs daily  Spiriva patient assistance paperwork .  Continue Daliresp daily .  Continue on Prednisone 10mg  daily.   Delsym 2 tsp Twice daily  for cough As needed   Tessalon Three times a day  for cough As needed   Continue on Claritin 10mg  daily.  Albuterol inhaler or Duoneb As needed   Continue on Oxygen 2l/m as needed to keep Oxygen Levels >88-90%.  Continue on Trilogy vent with Oxygen 4 l/m  Flutter valve Twice daily   Follow up with Dr. Delton Coombes or Lorelie Biermann NP in 3 months with Spirometry with DLCO  and As needed   Please contact office for sooner follow up if symptoms do not improve or worsen or seek emergency care

## 2023-11-15 NOTE — Progress Notes (Addendum)
 @Patient  ID: Robert Church, male    DOB: [birth year], 67 y.o.   MRN: [id number]  Chief Complaint  Patient presents with   Follow-up    Referring provider: [name], MD  HPI: 67 year old male former smoker followed for severe COPD, chronic hypoxic and hypercarbic respiratory failure on oxygen and trilogy noninvasive vent at bedtime and sleep apnea Medical history significant for CHF  TEST/EVENTS :  Low-dose CT screening CT March 14, 2021 showed lung RADS 2 benign appearance, mild emphysema, mild scarring at the left upper lobe, 4.7 mm right middle lobe nodule and a 5.8 mm right lower lobe nodule unchanged from 2019   CT chest March 17, 2023 moderate emphysema, smoking-related respiratory bronchiolitis, lung RADS 2   Chest x-ray July 19, 2023 no acute process   Spirometry December 2016 showed moderate restriction with an FEV1 at 69%, ratio 75, FVC 72%, decreased mid flows   Echo 05/2023 EF 35-40%, LV severe dilation,    RHC 02/2023 Mid PAH , mild reduced cardiac output   11/15/2023 Follow up : COPD, O2 RF, OSA  Patient presents for 51-month follow-up.  Patient was seen last visit with a slow to resolve COPD exacerbation.  He completed a 5-day course of Levaquin.  And was given a short prednisone burst.  Since last visit patient says he is feeling better.  He was changed from [facility] to Advair due to insurance reasons but has changed back said Advair did not work at all.  He is currently taking Dulera 2 puffs twice daily.  Uses Spiriva daily.  Does need Spiriva patient assistance paperwork completed. He remains on oxygen 2 L during the daytime with activity.  And uses oxygen with his trilogy vent at bedtime at 4 L. Trilogy NIV download shows 100% compliance daily average usage at 12 hours.  Remains on Daliresp daily.  He is on chronic steroids with prednisone 10 mg daily.  The patient also reports arthritis in his back and knees, with the knees being bone-on-bone. He is currently  undergoing rehabilitation for his legs and is considering shots for his arthritis.  He has declined flu shot.        Allergies  Allergen Reactions   Bee Venom Shortness Of Breath and Swelling   Cefdinir Other (See Comments)    Patient states he couldn't really breath    Spironolactone Anaphylaxis   Bactrim [Sulfamethoxazole-Trimethoprim] Other (See Comments)    Mouth burning   Daliresp [Roflumilast] Other (See Comments)    Dizzy, headache, leg pain per patient. 02/25/22. Tolerates in low doses    Jardiance [Empagliflozin] Nausea Only and Other (See Comments)    Lightheadness Dizziness   Penicillins Swelling and Other (See Comments)    Childhood rxn--MD stated he "almost died" Has patient had a PCN reaction causing immediate rash, facial/tongue/throat swelling, SOB or lightheadedness with hypotension:Yes Has patient had a PCN reaction causing severe rash involving mucus membranes or skin necrosis:unsure Has patient had a PCN reaction that required hospitalization:unsure Has patient had a PCN reaction occurring within the last 10 years:No If all of the above answers are "NO", then may proceed with Cephalosporin use.     Singulair [Montelukast] Swelling   Clindamycin Other (See Comments)    Tongue swelling   Doxycycline Nausea And Vomiting    Severe stomach upset per patient   E-Mycin [Erythromycin Base] Swelling   Rosuvastatin Other (See Comments)    Myalgias (intolerance)    Immunization History  Administered Date(s) Administered  Influenza,inj,Quad PF,6+ Mos 09/17/2021   PNEUMOCOCCAL CONJUGATE-20 08/20/2021   Tdap 12/03/2011    Past Medical History:  Diagnosis Date   Adenomatous colon polyp    Allergy    Anal fissure    Arthritis    Asthma    Bronchitis    CHF (congestive heart failure) (HCC)    COPD, group D, by GOLD 2017 classification (HCC)    Emphysema lung (HCC)    GERD (gastroesophageal reflux disease)    History of hiatal hernia    Hyperlipidemia     Hypertension    NICM (nonischemic cardiomyopathy) (HCC)    OSA (obstructive sleep apnea)    Pneumonia    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   Requires supplemental oxygen     Tobacco History: Social History   Tobacco Use  Smoking Status Former   Current packs/day: 0.00   Average packs/day: 2.0 packs/day for 46.0 years (92.0 ttl pk-yrs)   Types: Cigarettes   Start date: 01/28/1976   Quit date: 01/27/2022   Years since quitting: 1.8   Passive exposure: Past  Smokeless Tobacco Never  Tobacco Comments   Chews on a cigar.  09/16/2023 hfb   Counseling given: Not Answered Tobacco comments: Chews on a cigar.  09/16/2023 hfb   Outpatient Medications Prior to Visit  Medication Sig Dispense Refill   albuterol (PROVENTIL) (2.5 MG/3ML) 0.083% nebulizer solution TAKE 3 MLS BY NEBULIZATION EVERY 4 HOURS AS NEEDED FOR WHEEZING OR SHORTNESS OF BREATH 150 mL 11   allopurinol (ZYLOPRIM) 300 MG tablet Take 1 tablet (300 mg total) by mouth daily. 90 tablet 0   ALPRAZolam (XANAX) 0.5 MG tablet TAKE 1 TABLET(0.5 MG) BY MOUTH THREE TIMES DAILY 270 tablet 0   DALIRESP 500 MCG TABS tablet TAKE 1 TABLET(500 MCG) BY MOUTH DAILY 30 tablet 2   diclofenac Sodium (VOLTAREN) 1 % GEL Apply 1 Application topically in the morning and at bedtime.     EPINEPHrine 0.3 mg/0.3 mL IJ SOAJ injection Inject 0.3 mg into the muscle as needed for anaphylaxis. 1 each 0   fluticasone (FLONASE) 50 MCG/ACT nasal spray SHAKE LIQUID AND USE 2 SPRAYS IN EACH NOSTRIL DAILY (Patient taking differently: Place 1 spray into both nostrils daily as needed for allergies.) 16 g 2   gabapentin (NEURONTIN) 100 MG capsule Take 1 capsule (100 mg total) by mouth 3 (three) times daily. 90 capsule 3   guaiFENesin (MUCINEX) 600 MG 12 hr tablet Take 1 tablet (600 mg total) by mouth 2 (two) times daily. (Patient taking differently: Take 600 mg by mouth 2 (two) times daily as needed for cough or to loosen phlegm.) 60 tablet 5   ipratropium  (ATROVENT) 0.06 % nasal spray Place 2 sprays into both nostrils 4 (four) times daily as needed for rhinitis. 45 mL 3   ipratropium-albuterol (DUONEB) 0.5-2.5 (3) MG/3ML SOLN Take 3 mLs by nebulization every 4 (four) hours as needed. 360 mL 5   Lidocaine, Anorectal, 5 % CREA Apply 1 application topically 2 (two) times daily. (Patient taking differently: Apply 1 application  topically daily as needed (Fissure).) 30 g 1   loratadine (CLARITIN) 10 MG tablet Take 10 mg by mouth every other day.     losartan (COZAAR) 25 MG tablet Take 1 tablet (25 mg total) by mouth daily. (Patient taking differently: Take 25 mg by mouth as needed.) 90 tablet 3   metolazone (ZAROXOLYN) 2.5 MG tablet Take 1 tablet (2.5 mg total) by mouth as directed.  5 tablet 6   mometasone-formoterol (DULERA) 100-5 MCG/ACT AERO Inhale 2 puffs into the lungs 2 (two) times daily. 1 each 5   nicotine (NICODERM CQ - DOSED IN MG/24 HOURS) 14 mg/24hr patch Place 1 patch (14 mg total) onto the skin daily. 28 patch 0   omeprazole (PRILOSEC) 20 MG capsule TAKE 1 CAPSULE(20 MG) BY MOUTH TWICE DAILY BEFORE A MEAL FOR ACID REFLUX 180 capsule 0   potassium chloride SA (KLOR-CON M20) 20 MEQ tablet Take 1 tablet (20 mEq total) by mouth daily. Take an extra 40 meq when you take metolazone 90 tablet 3   Testosterone 20.25 MG/1.25GM (1.62%) GEL Place 1.25 g onto the skin daily. (Patient taking differently: Place 1 Pump onto the skin every other day.) 112.5 g 0   Tiotropium Bromide Monohydrate (SPIRIVA RESPIMAT) 2.5 MCG/ACT AERS Inhale 2 puffs into the lungs daily. 4 g 6   VENTOLIN HFA 108 (90 Base) MCG/ACT inhaler INHALE 2 PUFFS INTO THE LUNGS EVERY 6 HOURS AS NEEDED FOR WHEEZING 18 g 4   vitamin B-12 (CYANOCOBALAMIN) 1000 MCG tablet Take 1,000 mcg by mouth daily.     torsemide (DEMADEX) 20 MG tablet Take 2 tablets (40 mg total) by mouth 2 (two) times daily. (Patient taking differently: Take 20 mg by mouth daily. Take 2 tablets (40mg ) by mouth  daily) 120  tablet 1   No facility-administered medications prior to visit.     Review of Systems:   Constitutional:   No  weight loss, night sweats,  Fevers, chills, +fatigue, or  lassitude.  HEENT:   No headaches,  Difficulty swallowing,  Tooth/dental problems, or  Sore throat,                No sneezing, itching, ear ache, nasal congestion, post nasal drip,   CV:  No chest pain,  Orthopnea, PND, swelling in lower extremities, anasarca, dizziness, palpitations, syncope.   GI  No heartburn, indigestion, abdominal pain, nausea, vomiting, diarrhea, change in bowel habits, loss of appetite, bloody stools.   Resp: .  No chest wall deformity  Skin: no rash or lesions.  GU: no dysuria, change in color of urine, no urgency or frequency.  No flank pain, no hematuria   MS:  No joint pain or swelling.  No decreased range of motion.  No back pain.    Physical Exam  BP 128/84 (BP Location: Left Arm, Patient Position: Sitting, Cuff Size: Normal)   Pulse 86   Temp 98 F (36.7 C) (Oral)   Ht 5\' 10"  (1.778 m)   Wt 235 lb 12.8 oz (107 kg)   SpO2 90%   BMI 33.83 kg/m   GEN: A/Ox3; pleasant , NAD, well nourished    HEENT:  [place]/AT,   NOSE-clear, THROAT-clear, no lesions, no postnasal drip or exudate noted.   NECK:  Supple w/ fair ROM; no JVD; normal carotid impulses w/o bruits; no thyromegaly or nodules palpated; no lymphadenopathy.    RESP  Clear  P & A; w/o, wheezes/ rales/ or rhonchi. no accessory muscle use, no dullness to percussion  CARD:  RRR, no m/r/g, no peripheral edema, pulses intact, no cyanosis or clubbing.  GI:   Soft & nt; nml bowel sounds; no organomegaly or masses detected.   Musco: Warm bil, no deformities or joint swelling noted.   Neuro: alert, no focal deficits noted.    Skin: Warm, no lesions or rashes    Lab Results:  CBC   BMET     Imaging:  No results found.  Administration History     None           No data to display          No results  found for: "NITRICOXIDE"      Assessment & Plan:  Assessment and Plan    Chronic Obstructive Pulmonary Disease (COPD)   He has severe COPD with chronic hypoxic and hypercarbic respiratory failure, requiring oxygen therapy (2L with activity, 4L at bedtime) and nighttime use of a noninvasive ventilator. He recently switched back Dulera due to better efficacy, and continues on Spiriva, Daliresp, and prednisone 10 mg daily. He reports fair control but still experiences congestion. We discussed the potential future use of Dupixent if COPD worsens or becomes difficult to control. We will send refills for [facility], complete paperwork for Spiriva patient assistance forms. plan for a pulmonary function test at the next visit, and follow up in three months.  Chronic hypoxic and hypercarbic respiratory failure continue on oxygen to maintain O2 saturations greater than 88 to 90% continue on nocturnal trilogy vent with oxygen at bedtime has perceived benefit-and excellent compliance.  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 He is undergoing rehabilitation and considering injections for pain management.  Continue follow-up orthopedics    General Health Maintenance   Declines flu shot. Follow-up   We will schedule a follow-up appointment in three months.       I spent 33   minutes dedicated to the care of this patient on the date of this encounter to include pre-visit review of records, face-to-face time with the patient discussing conditions above, post visit ordering of testing, clinical documentation with the electronic health record, making appropriate referrals as documented, and communicating necessary findings to members of the patients care team.    Rubye Oaks, NP 11/15/2023

## 2023-11-16 ENCOUNTER — Ambulatory Visit: Payer: Medicare Other | Admitting: Physician Assistant

## 2023-11-16 ENCOUNTER — Telehealth: Payer: Self-pay | Admitting: *Deleted

## 2023-11-16 ENCOUNTER — Ambulatory Visit (INDEPENDENT_AMBULATORY_CARE_PROVIDER_SITE_OTHER): Payer: Medicare Other

## 2023-11-16 DIAGNOSIS — I5022 Chronic systolic (congestive) heart failure: Secondary | ICD-10-CM | POA: Diagnosis not present

## 2023-11-16 DIAGNOSIS — I442 Atrioventricular block, complete: Secondary | ICD-10-CM

## 2023-11-16 LAB — CUP PACEART REMOTE DEVICE CHECK
Battery Remaining Longevity: 79 mo
Battery Remaining Percentage: 83 %
Battery Voltage: 2.99 V
Brady Statistic AP VP Percent: 1 %
Brady Statistic AP VS Percent: 1 %
Brady Statistic AS VP Percent: 98 %
Brady Statistic AS VS Percent: 1 %
Brady Statistic RA Percent Paced: 1 %
Date Time Interrogation Session: 20241126020014
Implantable Lead Connection Status: 753985
Implantable Lead Connection Status: 753985
Implantable Lead Connection Status: 753985
Implantable Lead Implant Date: 20220427
Implantable Lead Implant Date: 20220427
Implantable Lead Implant Date: 20231122
Implantable Lead Location: 753858
Implantable Lead Location: 753859
Implantable Lead Location: 753860
Implantable Pulse Generator Implant Date: 20231122
Lead Channel Impedance Value: 490 Ohm
Lead Channel Impedance Value: 510 Ohm
Lead Channel Impedance Value: 650 Ohm
Lead Channel Pacing Threshold Amplitude: 0.625 V
Lead Channel Pacing Threshold Amplitude: 0.625 V
Lead Channel Pacing Threshold Amplitude: 1 V
Lead Channel Pacing Threshold Pulse Width: 0.5 ms
Lead Channel Pacing Threshold Pulse Width: 0.5 ms
Lead Channel Pacing Threshold Pulse Width: 0.5 ms
Lead Channel Sensing Intrinsic Amplitude: 1.4 mV
Lead Channel Sensing Intrinsic Amplitude: 8.6 mV
Lead Channel Setting Pacing Amplitude: 1.625
Lead Channel Setting Pacing Amplitude: 2 V
Lead Channel Setting Pacing Amplitude: 2 V
Lead Channel Setting Pacing Pulse Width: 0.5 ms
Lead Channel Setting Pacing Pulse Width: 0.5 ms
Lead Channel Setting Sensing Sensitivity: 2 mV
Pulse Gen Model: 3222
Pulse Gen Serial Number: 3979797

## 2023-11-16 NOTE — Telephone Encounter (Signed)
Faxed patient assistance paperwork (application, script, demographics, med/allergy list, insurance information) to [phone]).  Attempted to fax 5 times without success.  I called [employer] patient assistance to request another fax # and was told that is the only fax they have for patient assistance, it is probably just busy.  Attempted to fax the forms again.

## 2023-11-17 NOTE — Telephone Encounter (Signed)
Faxed form again this morning, received confirmation fax that it was sent successfully.  A copy was made of the application and sent to scan.

## 2023-11-19 ENCOUNTER — Telehealth: Payer: Self-pay

## 2023-11-19 ENCOUNTER — Other Ambulatory Visit (HOSPITAL_COMMUNITY): Payer: Self-pay

## 2023-11-19 ENCOUNTER — Other Ambulatory Visit: Payer: Self-pay | Admitting: Emergency Medicine

## 2023-11-19 DIAGNOSIS — E785 Hyperlipidemia, unspecified: Secondary | ICD-10-CM | POA: Diagnosis not present

## 2023-11-19 DIAGNOSIS — Z7952 Long term (current) use of systemic steroids: Secondary | ICD-10-CM | POA: Diagnosis not present

## 2023-11-19 DIAGNOSIS — J9611 Chronic respiratory failure with hypoxia: Secondary | ICD-10-CM | POA: Diagnosis not present

## 2023-11-19 DIAGNOSIS — Z87891 Personal history of nicotine dependence: Secondary | ICD-10-CM | POA: Diagnosis not present

## 2023-11-19 DIAGNOSIS — I428 Other cardiomyopathies: Secondary | ICD-10-CM | POA: Diagnosis not present

## 2023-11-19 DIAGNOSIS — J9612 Chronic respiratory failure with hypercapnia: Secondary | ICD-10-CM | POA: Diagnosis not present

## 2023-11-19 DIAGNOSIS — I110 Hypertensive heart disease with heart failure: Secondary | ICD-10-CM | POA: Diagnosis not present

## 2023-11-19 DIAGNOSIS — J441 Chronic obstructive pulmonary disease with (acute) exacerbation: Secondary | ICD-10-CM | POA: Diagnosis not present

## 2023-11-19 DIAGNOSIS — Z7951 Long term (current) use of inhaled steroids: Secondary | ICD-10-CM | POA: Diagnosis not present

## 2023-11-19 DIAGNOSIS — I5042 Chronic combined systolic (congestive) and diastolic (congestive) heart failure: Secondary | ICD-10-CM | POA: Diagnosis not present

## 2023-11-19 DIAGNOSIS — G4733 Obstructive sleep apnea (adult) (pediatric): Secondary | ICD-10-CM | POA: Diagnosis not present

## 2023-11-19 DIAGNOSIS — J431 Panlobular emphysema: Secondary | ICD-10-CM | POA: Diagnosis not present

## 2023-11-19 DIAGNOSIS — Z9981 Dependence on supplemental oxygen: Secondary | ICD-10-CM | POA: Diagnosis not present

## 2023-11-19 DIAGNOSIS — Z556 Problems related to health literacy: Secondary | ICD-10-CM | POA: Diagnosis not present

## 2023-11-19 NOTE — Telephone Encounter (Signed)
Pharmacy Patient Advocate Encounter   Received notification from CoverMyMeds that prior authorization for Roflumilast tablets is required/requested.   Insurance verification completed.   The patient is insured through [employer] .   Per test claim: PA required; PA submitted to above mentioned insurance via CoverMyMeds Key/confirmation #/EOC [id number] Status is pending

## 2023-11-20 ENCOUNTER — Other Ambulatory Visit (HOSPITAL_BASED_OUTPATIENT_CLINIC_OR_DEPARTMENT_OTHER): Payer: Self-pay | Admitting: Family

## 2023-11-22 ENCOUNTER — Other Ambulatory Visit (HOSPITAL_COMMUNITY): Payer: Self-pay

## 2023-11-22 MED ORDER — DALIRESP 500 MCG PO TABS: 500.0000 ug | ORAL_TABLET | Freq: Every day | ORAL | 5 refills | Status: DC

## 2023-11-22 NOTE — Telephone Encounter (Signed)
Patient calling back to check status of name brand inhaler- patient is completely out. Needs it sent to [facility] by end of day.

## 2023-11-22 NOTE — Telephone Encounter (Signed)
2-3 years ago he tried to take it and something in the generic caused him to have a reaction of some kind. He can not really remember. He thinks it increased his SOB.

## 2023-11-22 NOTE — Telephone Encounter (Signed)
Please have patient elaborate on why they cannot tolerate generic, no notes on patient having issues previously.

## 2023-11-22 NOTE — Telephone Encounter (Signed)
Patient calling back. States that he has tried generic previously and that it simply did not work for him and that it bothered him. Does not remember details as it has been 2-3 years since this happened. States that if we can get this handled by 5 pm, his insurance will fill it for him today.

## 2023-11-22 NOTE — Telephone Encounter (Signed)
Brand name -prescription was sent. Patient has been unable to take generic form in the past said he could not tolerate.  Rx was resent under Daliresp -DAW .  May need prior authorization through insurance.  Please send back to the pharmacy team

## 2023-11-23 ENCOUNTER — Other Ambulatory Visit (HOSPITAL_COMMUNITY): Payer: Self-pay

## 2023-11-23 NOTE — Telephone Encounter (Signed)
Expedited request submitted for Brand Daliresp  Pharmacy Patient Advocate Encounter   Received notification from Pt Calls Messages that prior authorization for Daliresp tablets is required/requested.   Insurance verification completed.   The patient is insured through [facility] .   Per test claim: PA required; PA submitted to above mentioned insurance via CoverMyMeds Key/confirmation #/EOC [id number] Status is pending

## 2023-11-23 NOTE — Telephone Encounter (Signed)
Patient is out of medication and needs for it to be approved by his insurance in order for it to be filled.   Pharmacy Daliresp

## 2023-11-24 ENCOUNTER — Other Ambulatory Visit (INDEPENDENT_AMBULATORY_CARE_PROVIDER_SITE_OTHER): Payer: Medicare Other | Admitting: Pharmacist

## 2023-11-24 DIAGNOSIS — E118 Type 2 diabetes mellitus with unspecified complications: Secondary | ICD-10-CM

## 2023-11-24 NOTE — Telephone Encounter (Signed)
Patient is returning phone call. Patient phone number is [phone]. 

## 2023-11-24 NOTE — Telephone Encounter (Signed)
Spoke to patient and relayed below message. He voiced his understanding and stated that he would pick up Rx tomorrow.  Nothing further needed.

## 2023-11-24 NOTE — Telephone Encounter (Signed)
Spoke to Turah with [employer]. She stated that it is too early for a refill. Insurance will not pay until tomorrow. Cala Bradford stated that pt is aware.   Lm for patient.

## 2023-11-24 NOTE — Telephone Encounter (Signed)
Atc pt, no answer

## 2023-11-24 NOTE — Progress Notes (Signed)
   11/24/2023 Name: Robert Church MRN: [id number] DOB: [birth year]  No chief complaint on file.   [name] is a 67 y.o. year old male who presented for a telephone visit.   They were referred to the pharmacist by their PCP for assistance in managing diabetes and hypertension.   Waiting on referral for suspected gout above elbow, notes it is getting bigger Also asked about handicap scooter?  Subjective:  Care Team: Primary Care Provider: [name], MD ; Next Scheduled Visit: not scheduled  Medication Access/Adherence  Current Pharmacy:  [id number] [name], [place] - [address] AT [facility] OF [facility] [address] [place] [address] Phone: [phone] Fax: [phone]  [facility] #[id number] - [name], [name] - [address] AT [facility] OF [address] & [address] [place] [address] Phone: [phone] Fax: [phone]   Patient reports affordability concerns with their medications: Yes  Patient reports access/transportation concerns to their pharmacy: Yes  Patient reports adherence concerns with their medications:  No    Pt notes Robert Church is expensive and that he was denied for PAP. States he has Spiriva PAP already through pulm office.   Diabetes:  Current medications: diet controlled Medications tried in the past: Farxiga, Jardiance, metformin   Hypertension/HF:  Current medications: torsemide 20 mg daily, metolazone PRN Medications previously tried: losartan on med list, pt notes he has not taken it in a long time   Patient denies hypotensive s/sx including dizziness, lightheadedness.  Pt denies symptoms of fluid overload.  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 He notes it has gotten larger in size since last appt with  PCP  Objective:  BP Readings from Last 3 Encounters:  11/29/23 113/78  11/15/23 128/84  11/11/23 127/84     Lab Results  Component Value Date   HGBA1C 6.7 (H) 11/09/2023    Lab Results  Component Value Date   CREATININE 1.04 09/20/2023   BUN 16 09/20/2023   NA 137 09/20/2023   K 3.9 09/20/2023   CL 96 (L) 09/20/2023   CO2 29 09/20/2023    Lab Results  Component Value Date   CHOL 221 (H) 03/17/2023   HDL 45.50 03/17/2023   LDLCALC 136 (H) 03/17/2023   LDLDIRECT 157.0 09/15/2022   TRIG 196.0 (H) 03/17/2023   CHOLHDL 5 03/17/2023    Medications Reviewed Today   Medications were not reviewed in this encounter       Assessment/Plan:   Diabetes: - Currently controlled - Recommend to continue lifestyle modifications     Hypertension: - Currently controlled - Recommend to continue current regimen   Med access: No PAP available for [facility] or comparable inhalers such as Symbicort.     Gout: Recommend uric acid recheck in 4-6 weeks, goal <6% Will ask PCP about the referral pt mentioned   Follow Up Plan: PRN  Arbutus Leas, PharmD, BCPS, CPP Clinical Pharmacist Practitioner [facility] [phone]

## 2023-11-26 ENCOUNTER — Encounter: Payer: Medicare Other | Admitting: Physical Medicine & Rehabilitation

## 2023-11-27 DIAGNOSIS — J449 Chronic obstructive pulmonary disease, unspecified: Secondary | ICD-10-CM | POA: Diagnosis not present

## 2023-11-27 DIAGNOSIS — J9610 Chronic respiratory failure, unspecified whether with hypoxia or hypercapnia: Secondary | ICD-10-CM | POA: Diagnosis not present

## 2023-11-28 DIAGNOSIS — J449 Chronic obstructive pulmonary disease, unspecified: Secondary | ICD-10-CM | POA: Diagnosis not present

## 2023-11-29 ENCOUNTER — Telehealth: Payer: Self-pay | Admitting: Physical Medicine & Rehabilitation

## 2023-11-29 ENCOUNTER — Encounter: Payer: Medicare Other | Attending: Physical Medicine & Rehabilitation | Admitting: Physical Medicine & Rehabilitation

## 2023-11-29 ENCOUNTER — Encounter: Payer: Self-pay | Admitting: Physical Medicine & Rehabilitation

## 2023-11-29 ENCOUNTER — Telehealth: Payer: Self-pay | Admitting: Physical Medicine and Rehabilitation

## 2023-11-29 VITALS — BP 113/78 | HR 92 | Ht 70.0 in | Wt 232.0 lb

## 2023-11-29 DIAGNOSIS — M170 Bilateral primary osteoarthritis of knee: Secondary | ICD-10-CM | POA: Diagnosis not present

## 2023-11-29 DIAGNOSIS — G629 Polyneuropathy, unspecified: Secondary | ICD-10-CM | POA: Insufficient documentation

## 2023-11-29 DIAGNOSIS — R200 Anesthesia of skin: Secondary | ICD-10-CM | POA: Insufficient documentation

## 2023-11-29 DIAGNOSIS — M545 Low back pain, unspecified: Secondary | ICD-10-CM | POA: Diagnosis not present

## 2023-11-29 DIAGNOSIS — M533 Sacrococcygeal disorders, not elsewhere classified: Secondary | ICD-10-CM | POA: Diagnosis not present

## 2023-11-29 DIAGNOSIS — G8929 Other chronic pain: Secondary | ICD-10-CM | POA: Diagnosis not present

## 2023-11-29 MED ORDER — GABAPENTIN 300 MG PO CAPS: 300.0000 mg | ORAL_CAPSULE | Freq: Three times a day (TID) | ORAL | 4 refills | Status: DC

## 2023-11-29 MED ORDER — LIDOCAINE HCL 1 % IJ SOLN
2.0000 mL | Freq: Once | INTRAMUSCULAR | Status: AC
  Administered 2023-11-29: 2 mL

## 2023-11-29 MED ORDER — TRIAMCINOLONE ACETONIDE 40 MG/ML IJ SUSP
80.0000 mg | Freq: Once | INTRAMUSCULAR | Status: AC
  Administered 2023-11-29: 80 mg via INTRA_ARTICULAR

## 2023-11-29 NOTE — Telephone Encounter (Signed)
Patient called needing to schedule an appointment with Dr. Alvester Morin. The number to contact patient is [phone]

## 2023-11-29 NOTE — Telephone Encounter (Signed)
Patient called in and is requesting to schedule back injections with Dr Wynn Banker, patient state he had mentioned before and would like and go ahead and be seen for his back as well. Patient states he has gotten injection at [facility] before and would like to go ahead and schedule here

## 2023-11-29 NOTE — Progress Notes (Addendum)
Subjective:    Patient ID: Robert Church, male    DOB: 1956-10-10, 67 y.o.   MRN: [id number]  HPI     HPI  Robert Church is a 67 y.o. 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   They are presenting to PM&R clinic as a new patient for pain management evaluation. They were referred by Ellin Goodie for treatment of lumbar radiculopathy pain.  Patient reports he has had pain issues since he had several falls about 4 years ago.  He reports his worst pain is along his plantar and dorsal feet bilaterally.  This is a pins and needle sensation.  It hurts to put weight on his feet in the morning.  His second worst pain is in his bilateral knees.  He reports he is not having knee pain at rest but this occurs with ambulation.  He also has pain in his lower back.  Lower back pain is worsened with standing and with activity.  He reports his pain is doing little better recently and is not his greatest source of pain at this time.  He also reports having issues with gout.  Reports recent flare in his right hand, prior flare in his left great toe.  He further more reports numbness in his fourth and fifth digits when he wakes up at night bilaterally.  He does not have this symptom during the day.  He typically uses IcyHot and BC powder to help his pain.  He reports he was previously seen by orthopedics and had injections by Dr. Alvester Morin.  He says he was also seen by Dr. Horald Chestnut previously.  Seen by [facility] in the past, appears they were planning to get a CT  myelogram L-spine and C-spine previously. Pt not interested in doing this.   Patient says he is prediabetes, although it appears he now may have diabetes and  A1c 6.7.   Red flag symptoms: No red flags for back pain endorsed in Hx or ROS  Medications tried: Topical medications IcyHot Nsaids Ibuprofen- helps, BC powder helps.  He does not use multiple NSAIDs together Tylenol - doesn't use due to liver disease Opiates denies Gabapentin -reports he was ordered this but never given to try TCAs - denies SNRIs  denies   Other treatments: PT- denies  TENs unit denies Injections back and neck injections by Dr. Horald Chestnut and Dr. Alvester Morin in the past-reports this helped his prior sciatica.  He says it also helped his lower back pain for a week or 2 but did not last Injections for b/l Knee OA- doesn't remember how this worked.  Appears he also had Orthovisc injection in the past for his knees-he thinks this may have caused a gout flare   Interval history 11/29/2023 Patient is here for bilateral knee cortisone injection.  He reports he had a gout flare from gel injection but never had this kind of issue with cortisone injections.  He does not remember how well cortisone injections worked in the past.  He continues to also have pain in his back with pain shooting down his legs.  He has not noticed much change with gabapentin 100 mg  3 times daily, not having any side effects with the medication.  Prior UDS results: No results found for: "LABOPIA", "COCAINSCRNUR", "LABBENZ", "AMPHETMU", "THCU", "LABBARB"     Pain Inventory Average Pain 8 Pain Right Now 6 My pain is sharp and aching  In the last 24 hours, has pain interfered with the following? General activity 8 Relation with others 8 Enjoyment of life 8 What TIME of day is your pain at its worst? morning , daytime, and evening Sleep (in general) Fair  Pain is worse with: walking and standing Pain improves with: medication Relief from Meds:  5  use a cane how many minutes can you walk? 5-10 ability to climb steps?  no do you drive?  yes  retired I need assistance with the following:  household duties and shopping  trouble walking anxiety  New pt  New pt    Family History  Problem Relation Age of Onset   Lung cancer Father    High blood pressure Mother    Diabetes Maternal Grandmother    Colon polyps Neg Hx    Esophageal cancer Neg Hx    Pancreatic cancer Neg Hx    Stomach cancer Neg Hx    Social History   Socioeconomic History   Marital status: Single    Spouse name: Not on file   Number of children: 1   Years of education: 12   Highest education level: 12th grade  Occupational History   Occupation: disabled  Tobacco Use   Smoking status: Former    Current packs/day: 0.00    Average packs/day: 2.0 packs/day for 46.0 years (92.0 ttl pk-yrs)    Types: Cigarettes    Start date: 01/28/1976    Quit date: 01/27/2022    Years since quitting: 1.8    Passive exposure: Past   Smokeless tobacco: Never   Tobacco comments:    Chews on a cigar.  09/16/2023 hfb  Vaping Use   Vaping status: Never Used  Substance and Sexual Activity   Alcohol use: Not Currently    Alcohol/week: 28.0 standard drinks of alcohol    Types: 28 Cans of beer per week   Drug use: No   Sexual activity: Yes    Partners: Female    Birth control/protection: None  Other Topics Concern   Not on file  Social History Narrative   Right handed   Tea sometimes and coffee (1/2 caffeine)   Social Determinants of Health   Financial Resource Strain: High Risk (11/04/2023)   Overall Financial Resource Strain (CARDIA)    Difficulty of Paying Living Expenses: Hard  Food Insecurity: Food Insecurity Present (11/04/2023)   Hunger Vital Sign    Worried About Running Out of Food in the Last Year: Sometimes true    Ran Out of Food in the Last Year: Sometimes true  Transportation Needs: No Transportation Needs (11/04/2023)   PRAPARE -  Administrator, Civil Service (Medical): No    Lack of Transportation (Non-Medical): No  Physical Activity: Unknown (11/04/2023)   Exercise Vital Sign    Days of Exercise per Week: 0 days    Minutes of Exercise per Session: Not on file  Stress: Stress Concern Present (11/04/2023)   [employer] of Occupational Health - Occupational Stress Questionnaire    Feeling of Stress : Rather much  Social Connections: Socially Isolated (11/04/2023)   Social Connection and Isolation Panel [NHANES]    Frequency of Communication with Friends and Family: More than three times a week  Frequency of Social Gatherings with Friends and Family: Once a week    Attends Religious Services: Never    Database administrator or Organizations: No    Attends Engineer, structural: Not on file    Marital Status: Divorced   Past Surgical History:  Procedure Laterality Date   BIV UPGRADE N/A 10/07/2021   Procedure: BIV PPM UPGRADE;  Surgeon: Regan Lemming, MD;  Location: [facility];  Service: Cardiovascular;  Laterality: N/A;   BIV UPGRADE N/A 11/11/2022   Procedure: BIV PPM UPGRADE;  Surgeon: Regan Lemming, MD;  Location: [facility];  Service: Cardiovascular;  Laterality: N/A;   LEFT HEART CATH AND CORONARY ANGIOGRAPHY N/A 04/19/2017   Procedure: Left Heart Cath and Coronary Angiography;  Surgeon: Lyn Records, MD;  Location: [facility]ASIVE CV [facility];  Service: Cardiovascular;  Laterality: N/A;   PACEMAKER IMPLANT N/A 04/16/2021   Procedure: PACEMAKER IMPLANT;  Surgeon: Regan Lemming, MD;  Location: [facility];  Service: Cardiovascular;  Laterality: N/A;   RIGHT HEART CATH N/A 03/09/2023   Procedure: RIGHT HEART CATH;  Surgeon: Dolores Patty, MD;  Location: [facility];  Service: Cardiovascular;  Laterality: N/A;   RIGHT/LEFT HEART CATH AND CORONARY ANGIOGRAPHY N/A 08/22/2021   Procedure: RIGHT/LEFT HEART CATH AND CORONARY ANGIOGRAPHY;   Surgeon: Dolores Patty, MD;  Location: [facility];  Service: Cardiovascular;  Laterality: N/A;   TONSILLECTOMY     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   Requires supplemental oxygen    BP 113/78   Pulse 92   Ht 5\' 10"  (1.778 m)   Wt 232 lb (105.2 kg)   SpO2 90%   BMI 33.29 kg/m   Opioid Risk Score:   Fall Risk Score:  `1  Depression screen PHQ 2/9     11/29/2023    2:13 PM 11/11/2023    1:41 PM 05/20/2023    3:37 PM 05/04/2023    2:35 PM 03/17/2023    3:22 PM 06/05/2022    2:20 PM 04/09/2021    1:44 PM  Depression screen PHQ 2/9  Decreased Interest 2 2 0 0 0 1 0  Down, Depressed, Hopeless 0 0 0 0 0 0 0  PHQ - 2 Score 2 2 0 0 0 1 0  Altered sleeping  2    1   Tired, decreased energy  2    3   Change in appetite  0    2   Feeling bad or failure about yourself   0    0   Trouble concentrating  0    0   Moving slowly or fidgety/restless  2    0   Suicidal thoughts  0    0   PHQ-9 Score  8    7   Difficult doing work/chores  Somewhat difficult  Review of Systems  Respiratory:  Positive for apnea, cough, shortness of breath and wheezing.   Gastrointestinal:  Positive for abdominal pain.  Musculoskeletal:  Positive for back pain and gait problem.       Bilateral shoulder pain Bilateral lower leg pain  Psychiatric/Behavioral:  The patient is nervous/anxious.   All other systems reviewed and are negative.      Objective:   Physical Exam  Gen: no distress, normal appearing HEENT: oral  mucosa pink and moist, NCAT Chest: normal effort, normal rate of breathing Abd: soft, non-distended Ext: no edema Psych: pleasant, normal affect Skin: intact Neuro: Alert and awake, follows commands, cranial nerves II through XII grossly intact, normal speech and language RUE: 5/5 Deltoid, 5/5 Biceps, 5/5 Triceps, 5/5 Wrist Ext, 5/5 Grip LUE: 5/5 Deltoid, 5/5 Biceps, 5/5 Triceps, 5/5 Wrist Ext, 5/5 Grip RLE: 4+ to 5 out of 5 limited by pain LLE: 4+ to 5 out of 5 limited by pain Altered sensation reported in his bilateral lower extremities in stocking glove type distribution-unchanged No abnormal tone appreciated.  + Lumbar paraspinal tenderness present  Slump test- hamstring tightness resulted Tinels neg at b/l wrist and elbow  Prior Exam Hyperreflexive DTR bilateral ankles and knees Tinel's negative at bilateral wrist and elbows Musculoskeletal:  Mild lumbar paraspinal tenderness SLR negative-tight hamstrings bilaterally Facet loading negative Tenderness over lower sacrum/coccyx Wide-based gait, appears a little unsteady Patient has a hard time getting up from a seated position and requires use of his arms  L-spine MRI 11/11/2023 FINDINGS: Segmentation: 5 lumbar type vertebrae.   Alignment: Grade 1 retrolisthesis at L2-3 and L3-4.   Vertebrae: No acute fracture or focal pathologic process.   Paraspinal and other soft tissues: Calcific aortic atherosclerosis.   Disc levels:   L1-2: Unremarkable.   L2-3: Unremarkable.   L3-4: Mild disc bulge without spinal canal or neural foraminal stenosis.   L4-5: Moderate facet hypertrophy with intermediate sized disc bulge and mild narrowing of the spinal canal. No neural impingement.   L5-S1: Right asymmetric disc bulge and endplate spurring with unchanged right subarticular disc protrusion. Unchanged severe right neural foraminal stenosis.   IMPRESSION: 1. No acute fracture or static subluxation of the lumbar spine. 2.  Unchanged right asymmetric disc bulge and endplate spurring at L5-S1 with unchanged right subarticular disc protrusion and severe right neural foraminal stenosis.   Knee xray Right 2023 Films of the right knee obtained in 3 projections standing.  There is  narrowing of the medial joint space associated with subchondral sclerosis  and peripheral osteophytes.  There is some osteophyte formation on the  lateral compartment as well.  Patella tracks laterally.  Films are  consistent with moderate osteoarthritis   Knee xray Left 2023 Films of the left knee were obtained in 3 projections standing and  compared to the right knee.  Findings are fairly similar with the last  narrowing of the medial joint space but otherwise consistent with moderate  osteoarthritis.  No acute changes.  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  Recent CT scan with DDD L3-4 L4-5, severe right neuroforaminal stenosis L5-S1  -Has been able able to get MRIs due to claustrophobia.  He also has a pacemaker but it sounds like this may be MRI compatible. 4) Liver cirrhosis 5) History  of alcohol abuse 6) Coccyx pain after a fall 7) Gout.  Appears he may have had a gout flare from Orthovisc injection performed by [facility].  Has been restarted on allopurinol by PCP. 8) Altered sensation fourth and fifth digits bilateral hands at night.  Likely cervical versus cubital tunnel origin.  -Consider EMG if this worsens  -Avoid alcohol use, patient reports he uses this very infrequently now.  Prior frequent use -Increase gabapentin 300 mg 3 times daily -Patient reports cardiology has told him not to get TENS unit due to pacemaker Liver disease- cirrhosis ,  -Discussed foods that can be helpful for pain.  He says he  uses turmeric and tart cherry juice already -Discussed referral to PT for balance and back pain- he declines today but will consider at a later visit -Knee injections today-discussed possible risks of knee injection.  Discussed monitoring glucose for the next few weeks more closely and what to do if glucose values to elevated. -Discussed trying cubital tunnel/ulnar nerve splint at night  --Discussed Qutenza as an option for neuropathic pain control. Discussed that this is a capsaicin patch, stronger than capsaicin cream. Discussed that it is currently approved for diabetic peripheral neuropathy and post-herpetic neuralgia, but that it has also shown benefit in treating other forms of neuropathy. Provided patient with link to site to learn more about the patch: https://www.clark.biz/. Discussed that the patch would be placed in office and benefits usually last 3 months. Discussed that unintended exposure to capsaicin can cause severe irritation of eyes, mucous membranes, respiratory tract, and skin, but that Qutenza is a local treatment and does not have the systemic side effects of other nerve medications. Discussed that there may be pain, itching, erythema, and decreased sensory function associated with the application of Qutenza. Side effects usually subside within 1 week. A cold pack of analgesic medications can help with these side effects. Blood pressure can also be increased due to pain associated with administration of the patch.  B/L knee injection   Indication:Knee pain not relieved by medication management and other conservative care.  Informed consent was obtained after describing risks and benefits of the procedure with the patient, this includes bleeding, bruising, infection and medication side effects. The patient wishes to proceed and has given written consent. The patient was placed in a recumbent position. The Lateral aspect of the knee was marked and prepped with Betadine and alcohol.  It was then entered with a 25-gauge 1-1/2 inch needle.  After negative draw back for blood, a solution containing one ML of 40mg  per mL kenalog and 1 mL of 1% lidocaine were injected. The patient tolerated the procedure well. Post procedure instructions were given. Procedure completed on both knees

## 2023-11-30 NOTE — Telephone Encounter (Signed)
Should a consult be scheduled for this patient for a back injection with Dr Wynn Banker ?

## 2023-12-02 ENCOUNTER — Encounter: Payer: Medicare Other | Admitting: Physical Medicine & Rehabilitation

## 2023-12-02 ENCOUNTER — Other Ambulatory Visit: Payer: Self-pay | Admitting: Internal Medicine

## 2023-12-02 ENCOUNTER — Encounter: Payer: Self-pay | Admitting: Physical Medicine & Rehabilitation

## 2023-12-02 VITALS — BP 142/91 | HR 90 | Ht 70.0 in | Wt 233.0 lb

## 2023-12-02 DIAGNOSIS — M533 Sacrococcygeal disorders, not elsewhere classified: Secondary | ICD-10-CM | POA: Diagnosis not present

## 2023-12-02 DIAGNOSIS — G8929 Other chronic pain: Secondary | ICD-10-CM | POA: Diagnosis not present

## 2023-12-02 DIAGNOSIS — M545 Low back pain, unspecified: Secondary | ICD-10-CM | POA: Diagnosis not present

## 2023-12-02 DIAGNOSIS — G629 Polyneuropathy, unspecified: Secondary | ICD-10-CM | POA: Diagnosis not present

## 2023-12-02 DIAGNOSIS — K2100 Gastro-esophageal reflux disease with esophagitis, without bleeding: Secondary | ICD-10-CM

## 2023-12-02 DIAGNOSIS — M170 Bilateral primary osteoarthritis of knee: Secondary | ICD-10-CM | POA: Diagnosis not present

## 2023-12-02 DIAGNOSIS — R200 Anesthesia of skin: Secondary | ICD-10-CM | POA: Diagnosis not present

## 2023-12-02 NOTE — Patient Instructions (Signed)
Sacroiliac Joint Injection A sacroiliac (SI) joint injection is a procedure to inject a numbing medicine (anesthetic block)--and sometimes a strong anti-inflammatory medicine (steroid)--into the SI joint. The SI joint is the joint between two bones of the pelvis called the sacrum and the ilium. The sacrum is the bone at the base of the spine. The ilium is the large bone that forms the hip. You may need this procedure if you have pain because of an inflamed or diseased SI joint. Various conditions can cause pain in the SI joint, including rheumatoid arthritis, gout, psoriatic arthritis, infection, or injury. SI joint pain is a common cause of lower back pain. It may also cause pain in your buttocks or leg. SI joint injection may be done to: Find out if an anesthetic block relieves pain. This can confirm that the SI joint is the cause of pain (diagnostic use). Treat a painful SI joint with steroids, anesthetic medicine, or both (therapeutic use). Tell a health care provider about: Any allergies you have. All medicines you are taking, including vitamins, herbs, eye drops, creams, and over-the-counter medicines. Any problems you or family members have had with anesthetic medicines. Any blood disorders you have. Any surgeries you have had. Any medical conditions you have. Whether you are pregnant or may be pregnant. What are the risks? Generally, this is a safe procedure. However, problems may occur, including: Infection. Bleeding. Nerve injury. Temporary increase in pain or failure to relieve pain. Headache. Bruising or soreness at the joint, in deep tissues, or at the injection site. Allergic reactions to medicines or dyes. There may also be side effects from the steroid medicine. These may include facial flushing, increased appetite, diarrhea, and increased blood sugar. What happens before the procedure? Medicines Ask your health care provider about: Changing or stopping your regular  medicines. This is especially important if you are taking diabetes medicines or blood thinners. Taking medicines such as aspirin and ibuprofen. These medicines can thin your blood. Do not take these medicines unless your health care provider tells you to take them. Taking over-the-counter medicines, vitamins, herbs, and supplements. General instructions You may have a physical exam. You may have imaging tests, such as an X-ray, CT scan, or MRI. Follow instructions from your health care provider about eating or drinking restrictions. Ask your health care provider what steps will be taken to help prevent infection. This may include washing skin with a germ-killing soap. Plan to have a responsible adult take you home from the hospital or clinic. What happens during the procedure?  You will be awake during the procedure and may be given one or more of the following: A medicine to help you relax (sedative). A medicine to numb the area (local anesthetic). Your health care provider will inject a local anesthetic into the skin above your SI joint. You will be placed in the proper position on a procedure table to give the health care team the best access to your SI joint. An X-ray machine that produces moving X-ray images (fluoroscopy) will be placed above the procedure table. A long, thin needle will be inserted through your skin and down to your SI joint. The position of the needle will be checked with fluoroscopy imaging. An X-ray dye will be injected to make sure the needle enters the joint space. You may be asked if you feel any pain. Long-acting anesthetic medicine will be injected. Long-acting steroid medicine may also be injected. The needle will be removed, and a bandage will be placed  over the injection site. The procedure may vary among health care providers and hospitals. What happens after the procedure? Your blood pressure, heart rate, breathing rate, and blood oxygen level will be  monitored until you leave the hospital or clinic. Since dye was used, you will be told to drink plenty of water to wash (flush) the dye out of your body. You may be asked if you have pain relief from the injection. You will likely be able to go home shortly after the procedure. Your health care provider will give you instructions for taking care of yourself after the procedure. These may include instructions for doing physical therapy exercises. If you were given a sedative during the procedure, it can affect you for several hours. Do not drive or operate machinery until your health care provider says that it is safe. Summary A sacroiliac (SI) joint injection is a procedure to inject a numbing medicine (anesthetic block)--and sometimes a strong anti-inflammatory medicine (steroid)--into the SI joint. You will be awake during the procedure. You may be given a sedative, a local anesthetic, or both. If you were given a sedative during the procedure, it can affect you for several hours. Do not drive or operate machinery until your health care provider says that it is safe. This information is not intended to replace advice given to you by your health care provider. Make sure you discuss any questions you have with your health care provider. Document Revised: 04/18/2020 Document Reviewed: 04/18/2020 Elsevier Patient Education  2024 [employer].

## 2023-12-02 NOTE — Progress Notes (Signed)
Subjective:    Patient ID: Robert Church, male    DOB: [birth year], 67 y.o.   MRN: [id number]  HPI 67 year old man referred by his primary physiatrist to evaluate chronic low back pain.  Onset of pain was several years ago.  He has had no change in the character of his pain except that he no longer has sciatic pain that accompanies his low back pain.  His low back pain is primarily present during standing and walking improves with sitting. He has tried physical therapy in the past and states it was not helpful for him.  He has seen other physical medicine and rehabilitation doctors including [facility]in [facility] as well as Dr. Ethelene Hal at [facility].  Did review injection notes from Dr. Alvester Morin last performed in July 2024 with a left-sided L4-5 paramedian interlaminar epidural steroid injection under fluoroscopic guidance.  No PT CT LUMBAR SPINE WITHOUT CONTRAST   TECHNIQUE: Multidetector CT imaging of the lumbar spine was performed without intravenous contrast administration. Multiplanar CT image reconstructions were also generated.   RADIATION DOSE REDUCTION: This exam was performed according to the departmental dose-optimization program which includes automated exposure control, adjustment of the mA and/or kV according to patient size and/or use of iterative reconstruction technique.   COMPARISON:  06/03/2021   FINDINGS: Segmentation: 5 lumbar type vertebrae.   Alignment: Grade 1 retrolisthesis at L2-3 and L3-4.   Vertebrae: No acute fracture or focal pathologic process.   Paraspinal and other soft tissues: Calcific aortic atherosclerosis.   Disc levels:   L1-2: Unremarkable.   L2-3: Unremarkable.   L3-4: Mild disc bulge without spinal canal or neural foraminal stenosis.   L4-5: Moderate facet hypertrophy with intermediate sized disc bulge and mild narrowing of the spinal canal. No neural impingement.   L5-S1: Right asymmetric disc bulge and endplate spurring  with unchanged right subarticular disc protrusion. Unchanged severe right neural foraminal stenosis.   IMPRESSION: 1. No acute fracture or static subluxation of the lumbar spine. 2. Unchanged right asymmetric disc bulge and endplate spurring at L5-S1 with unchanged right subarticular disc protrusion and severe right neural foraminal stenosis.   Aortic Atherosclerosis (ICD10-I70.0).     Electronically Signed   By: Deatra Robinson M.D.   On: 08/30/2023 19:03   Pain Inventory Average Pain 8 Pain Right Now 7 My pain is sharp and aching  In the last 24 hours, has pain interfered with the following? General activity 7 Relation with others 7 Enjoyment of life 8 What TIME of day is your pain at its worst? daytime Sleep (in general) Fair  Pain is worse with: walking, bending, inactivity, and standing Pain improves with: medication and injections Relief from Meds: 6  Family History  Problem Relation Age of Onset   Lung cancer Father    High blood pressure Mother    Diabetes Maternal Grandmother    Colon polyps Neg Hx    Esophageal cancer Neg Hx    Pancreatic cancer Neg Hx    Stomach cancer Neg Hx    Social History   Socioeconomic History   Marital status: Single    Spouse name: Not on file   Number of children: 1   Years of education: 12   Highest education level: 12th grade  Occupational History   Occupation: disabled  Tobacco Use   Smoking status: Former    Current packs/day: 0.00    Average packs/day: 2.0 packs/day for 46.0 years (92.0 ttl pk-yrs)    Types: Cigarettes  Start date: 01/28/1976    Quit date: 01/27/2022    Years since quitting: 1.8    Passive exposure: Past   Smokeless tobacco: Never   Tobacco comments:    Chews on a cigar.  09/16/2023 hfb  Vaping Use   Vaping status: Never Used  Substance and Sexual Activity   Alcohol use: Not Currently    Alcohol/week: 28.0 standard drinks of alcohol    Types: 28 Cans of beer per week   Drug use: No   Sexual  activity: Yes    Partners: Female    Birth control/protection: None  Other Topics Concern   Not on file  Social History Narrative   Right handed   Tea sometimes and coffee (1/2 caffeine)   Social Drivers of Corporate investment banker Strain: High Risk (11/04/2023)   Overall Financial Resource Strain (CARDIA)    Difficulty of Paying Living Expenses: Hard  Food Insecurity: Food Insecurity Present (11/04/2023)   Hunger Vital Sign    Worried About Running Out of Food in the Last Year: Sometimes true    Ran Out of Food in the Last Year: Sometimes true  Transportation Needs: No Transportation Needs (11/04/2023)   PRAPARE - Administrator, Civil Service (Medical): No    Lack of Transportation (Non-Medical): No  Physical Activity: Unknown (11/04/2023)   Exercise Vital Sign    Days of Exercise per Week: 0 days    Minutes of Exercise per Session: Not on file  Stress: Stress Concern Present (11/04/2023)   [employer] of Occupational Health - Occupational Stress Questionnaire    Feeling of Stress : Rather much  Social Connections: Socially Isolated (11/04/2023)   Social Connection and Isolation Panel [NHANES]    Frequency of Communication with Friends and Family: More than three times a week    Frequency of Social Gatherings with Friends and Family: Once a week    Attends Religious Services: Never    Database administrator or Organizations: No    Attends Engineer, structural: Not on file    Marital Status: Divorced   Past Surgical History:  Procedure Laterality Date   BIV UPGRADE N/A 10/07/2021   Procedure: BIV PPM UPGRADE;  Surgeon: Regan Lemming, MD;  [facility]: [facility];  Service: Cardiovascular;  Laterality: N/A;   BIV UPGRADE N/A 11/11/2022   Procedure: BIV PPM UPGRADE;  Surgeon: Regan Lemming, MD;  Location: [facility];  Service: Cardiovascular;  Laterality: N/A;   LEFT HEART CATH AND CORONARY ANGIOGRAPHY N/A 04/19/2017    Procedure: Left Heart Cath and Coronary Angiography;  Surgeon: Lyn Records, MD;  Location: [facility] INVASIVE CV LAB;  Service: Cardiovascular;  Laterality: N/A;   PACEMAKER IMPLANT N/A 04/16/2021   Procedure: PACEMAKER IMPLANT;  Surgeon: Regan Lemming, MD;  Location: [facility];  Service: Cardiovascular;  Laterality: N/A;   RIGHT HEART CATH N/A 03/09/2023   Procedure: RIGHT HEART CATH;  Surgeon: Dolores Patty, MD;  Location: [facility];  Service: Cardiovascular;  Laterality: N/A;   RIGHT/LEFT HEART CATH AND CORONARY ANGIOGRAPHY N/A 08/22/2021   Procedure: RIGHT/LEFT HEART CATH AND CORONARY ANGIOGRAPHY;  Surgeon: Dolores Patty, MD;  Location: [facility];  Service: Cardiovascular;  Laterality: N/A;   TONSILLECTOMY     Past Surgical History:  Procedure Laterality Date   BIV UPGRADE N/A 10/07/2021   Procedure: BIV PPM UPGRADE;  Surgeon: Regan Lemming, MD;  Location: [facility] INVASIVE  CV LAB;  Service: Cardiovascular;  Laterality: N/A;   BIV UPGRADE N/A 11/11/2022   Procedure: BIV PPM UPGRADE;  Surgeon: Regan Lemming, MD;  [facility]: [facility];  Service: Cardiovascular;  Laterality: N/A;   LEFT HEART CATH AND CORONARY ANGIOGRAPHY N/A 04/19/2017   Procedure: Left Heart Cath and Coronary Angiography;  Surgeon: Lyn Records, MD;  Location: [facility] INVASIVE CV LAB;  Service: Cardiovascular;  Laterality: N/A;   PACEMAKER IMPLANT N/A 04/16/2021   Procedure: PACEMAKER IMPLANT;  Surgeon: Regan Lemming, MD;  [facility]: [facility];  Service: Cardiovascular;  Laterality: N/A;   RIGHT HEART CATH N/A 03/09/2023   Procedure: RIGHT HEART CATH;  Surgeon: Dolores Patty, MD;  Location: [facility];  Service: Cardiovascular;  Laterality: N/A;   RIGHT/LEFT HEART CATH AND CORONARY ANGIOGRAPHY N/A 08/22/2021   Procedure: RIGHT/LEFT HEART CATH AND CORONARY ANGIOGRAPHY;  Surgeon: Dolores Patty, MD;  Location: [facility];  Service:  Cardiovascular;  Laterality: N/A;   TONSILLECTOMY     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   Requires supplemental oxygen    BP (!) 142/91   Pulse 90   Ht 5\' 10"  (1.778 m)   Wt 233 lb (105.7 kg)   SpO2 92%   BMI 33.43 kg/m   Opioid Risk Score:   Fall Risk Score:  `1  Depression screen PHQ 2/9     12/02/2023    2:08 PM 11/29/2023    2:13 PM 11/11/2023    1:41 PM 05/20/2023    3:37 PM 05/04/2023    2:35 PM 03/17/2023    3:22 PM 06/05/2022    2:20 PM  Depression screen PHQ 2/9  Decreased Interest 0 2 2 0 0 0 1  Down, Depressed, Hopeless 0 0 0 0 0 0 0  PHQ - 2 Score 0 2 2 0 0 0 1  Altered sleeping   2    1  Tired, decreased energy   2    3  Change in appetite   0    2  Feeling bad or failure about yourself    0    0  Trouble concentrating   0    0  Moving slowly or fidgety/restless   2    0  Suicidal thoughts   0    0  PHQ-9 Score   8    7  Difficult doing work/chores   Somewhat difficult          Review of Systems  Musculoskeletal:  Positive for back pain and gait problem.       B/L shoulder and ankle pain   All other systems reviewed and are negative.     Objective:   Physical Exam   Sacral thrust (prone) : + on R>L side Lateral compression: neg FABER's: + bilateral SI area Distraction (supine): neg Thigh thrust  test:  + bilateral SI area Motor strength is 5/5 bilateral hip flexor knee extensor ankle dorsiflexor and plantar flexor Negative straight leg raise bilaterally Sensation intact light touch  bilateral lower limbs Ambulates without assist device no evidence toe drag or knee stability He has some difficulty getting from supine to sit on a flat table.     Assessment & Plan:   1.  Chronic low back pain at 1 point he had more sciatic symptoms which were relieved with L4-5 translaminar epidural steroid injection last performed in July 2024.  Currently he has pain primarily below the waist centered at the PSIS area with positive provocative tests 3/5 for SI joint etiology.  Has failed other conservative care including over-the-counter as well as prescription drug.  Has failed physical therapy in the past If he fails diagnostic/therapeutic sacroiliac injection consider lumbar medial branch blocks to evaluate for facet arthropathy as potential etiology given pain with standing as well as facet hypertrophy seen on CT scan of the lumbar spine from September 2024.  More severe at the L4-5 level.  The patient has no allergies to local anesthetics or iodine/contrast.  No anticoagulants.

## 2023-12-03 ENCOUNTER — Telehealth: Payer: Self-pay | Admitting: Pharmacist

## 2023-12-03 DIAGNOSIS — M1A0221 Idiopathic chronic gout, left elbow, with tophus (tophi): Secondary | ICD-10-CM

## 2023-12-03 NOTE — Telephone Encounter (Signed)
   12/03/2023 Name: DARIAS DUQUE MRN: [id number] DOB: Jun 01, 1956  Chief Complaint  Patient presents with   Gout   Medication Management    DENCIL FETTEROLF is a 67 y.o. year old male who presented for a telephone visit.   They were referred to the pharmacist by their PCP for assistance in managing diabetes and hypertension.   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 Pt to come 12/23 for uric acid check     Follow Up Plan: 12/23 lab  Arbutus Leas, PharmD, BCPS, CPP Clinical Pharmacist Practitioner [facility] [phone]

## 2023-12-08 NOTE — Progress Notes (Deleted)
 PROCEDURE RECORD [facility]   Name: Robert Church DOB:[birth year] MRN: [id number]  Date:12/13/2023  Physician: Prentice Compton, MD    Nurse/CMA: Tye Kitty MA  Allergies:  Allergies  Allergen Reactions   Bee Venom Shortness Of Breath and Swelling   Cefdinir  Other (See Comments)    Patient states he couldn't really breath    Spironolactone  Anaphylaxis   Bactrim  [Sulfamethoxazole -Trimethoprim ] Other (See Comments)    Mouth burning   Daliresp  [Roflumilast ] Other (See Comments)    Dizzy, headache, leg pain per patient. 02/25/22. Tolerates in low doses    Jardiance  [Empagliflozin ] Nausea Only and Other (See Comments)    Lightheadness Dizziness   Penicillins Swelling and Other (See Comments)    Childhood rxn--MD stated he almost died Has patient had a PCN reaction causing immediate rash, facial/tongue/throat swelling, SOB or lightheadedness with hypotension:Yes Has patient had a PCN reaction causing severe rash involving mucus membranes or skin necrosis:unsure Has patient had a PCN reaction that required hospitalization:unsure Has patient had a PCN reaction occurring within the last 10 years:No If all of the above answers are NO, then may proceed with Cephalosporin use.     Singulair  [Montelukast ] Swelling   Clindamycin Other (See Comments)    Tongue swelling   Doxycycline  Nausea And Vomiting    Severe stomach upset per patient   E-Mycin [Erythromycin Base] Swelling   Rosuvastatin Other (See Comments)    Myalgias (intolerance)    Consent Signed: Yes.    Is patient diabetic? No.  CBG today? NA  Pregnant: No. LMP: No LMP for male patient. (age 37-55)  Anticoagulants: no Anti-inflammatory: no Antibiotics: no  Procedure:  Bilateral Sacroiliac Injection   Position: Prone Start Time: 11:53 AM  End Time: 11:59 AM  Fluoro Time: 36  RN/CMA Natacia Chaisson MA  Sherea Liptak MA    Time 11:45 AM 12:02 AM    BP 122/83 127/84    Pulse 90 85     Respirations 16 16    O2 Sat 95 95    S/S 6 6    Pain Level 7/10 2/10     D/C home with Self, patient A & O X 3, D/C instructions reviewed, and sits independently.         Subjective:    Patient ID: Robert Church, male    DOB: 1955/12/27, 67 y.o.   MRN: [id number]  HPI   .cpr PROCEDURE RECORD [facility]   Name: Robert Church DOB:March 13, 1956 MRN: [id number]  Date:12/08/2023  Physician: Prentice Compton, MD    Nurse/CMA: Tye Kitty MA  Allergies:  Allergies  Allergen Reactions   Bee Venom Shortness Of Breath and Swelling   Cefdinir  Other (See Comments)    Patient states he couldn't really breath    Spironolactone  Anaphylaxis   Bactrim  [Sulfamethoxazole -Trimethoprim ] Other (See Comments)    Mouth burning   Daliresp  [Roflumilast ] Other (See Comments)    Dizzy, headache, leg pain per patient. 02/25/22. Tolerates in low doses    Jardiance  [Empagliflozin ] Nausea Only and Other (See Comments)    Lightheadness Dizziness   Penicillins Swelling and Other (See Comments)    Childhood rxn--MD stated he almost died Has patient had a PCN reaction causing immediate rash, facial/tongue/throat swelling, SOB or lightheadedness with hypotension:Yes Has patient had a PCN reaction causing severe rash involving mucus membranes or skin necrosis:unsure Has patient had a PCN reaction that required hospitalization:unsure Has patient had a PCN reaction occurring within the last 10 years:No If  all of the above answers are NO, then may proceed with Cephalosporin use.     Singulair  [Montelukast ] Swelling   Clindamycin Other (See Comments)    Tongue swelling   Doxycycline  Nausea And Vomiting    Severe stomach upset per patient   E-Mycin [Erythromycin Base] Swelling   Rosuvastatin Other (See Comments)    Myalgias (intolerance)    Consent Signed: {yes wn:[id number]}  Is patient diabetic? {yes no:314532}  CBG today? ***  Pregnant: {yes  no:314532} LMP: No LMP for male patient. (age 23-55)  Anticoagulants: {Yes/No:19989} Anti-inflammatory: {Yes/No:19989} Antibiotics: {Yes/No:19989}  Procedure: Bilateral Sacroiliac Injection  Position: Prone Start Time: ***  End Time: ***  Fluoro Time: ***  RN/CMA Dorraine Ellender MA Shannell Mikkelsen MA    Time      BP      Pulse      Respirations      O2 Sat      S/S      Pain Level       D/C home with ***, patient A & O X 3, D/C instructions reviewed, and sits independently.        Review of Systems     Objective:   Physical Exam        Assessment & Plan:

## 2023-12-09 ENCOUNTER — Other Ambulatory Visit: Payer: Self-pay

## 2023-12-09 ENCOUNTER — Telehealth: Payer: Self-pay

## 2023-12-09 ENCOUNTER — Other Ambulatory Visit: Payer: Self-pay | Admitting: Internal Medicine

## 2023-12-09 DIAGNOSIS — M47816 Spondylosis without myelopathy or radiculopathy, lumbar region: Secondary | ICD-10-CM

## 2023-12-09 DIAGNOSIS — M545 Low back pain, unspecified: Secondary | ICD-10-CM

## 2023-12-09 DIAGNOSIS — G8929 Other chronic pain: Secondary | ICD-10-CM

## 2023-12-09 DIAGNOSIS — M170 Bilateral primary osteoarthritis of knee: Secondary | ICD-10-CM

## 2023-12-09 DIAGNOSIS — M5030 Other cervical disc degeneration, unspecified cervical region: Secondary | ICD-10-CM

## 2023-12-09 MED ORDER — TRAMADOL HCL 50 MG PO TABS: 50.0000 mg | ORAL_TABLET | Freq: Three times a day (TID) | ORAL | 1 refills | Status: AC | PRN

## 2023-12-09 MED ORDER — TRAMADOL HCL 50 MG PO TABS: 50.0000 mg | ORAL_TABLET | Freq: Three times a day (TID) | ORAL | 1 refills | Status: DC | PRN

## 2023-12-09 NOTE — Telephone Encounter (Signed)
Tramadol 50mg  refill to [facility] - [address]   Medication is not on patients medication list. Please advise.

## 2023-12-09 NOTE — Telephone Encounter (Signed)
Copied from CRM [id number]. Topic: Clinical - Medication Refill >> Dec 09, 2023  2:34 PM Almira Coaster wrote: Most Recent Primary Care Visit:  Provider: Candy Sledge R  Department: [facility]  Visit Type: PATIENT OUTREACH 60  Date: 11/24/2023  Medication: tramadol 50 mg  Has the patient contacted their pharmacy? Yes, they have no script for this medication. (Agent: If no, request that the patient contact the pharmacy for the refill. If patient does not wish to contact the pharmacy document the reason why and proceed with request.) (Agent: If yes, when and what did the pharmacy advise?)  Is this the correct pharmacy for this prescription? Yes If no, delete pharmacy and type the correct one.  This is the patient's preferred pharmacy:  [id number] [name], [place] - [address] AT [facility] OF [facility] [address] [place] [address] Phone: [phone] Fax: [phone]    Has the prescription been filled recently? No  Is the patient out of the medication? No, patient only has 3 pills left.   Has the patient been seen for an appointment in the last year OR does the patient have an upcoming appointment? Yes  Can we respond through MyChart? Yes  Agent: Please be advised that Rx refills may take up to 3 business days. We ask that you follow-up with your pharmacy.

## 2023-12-10 ENCOUNTER — Other Ambulatory Visit: Payer: Self-pay | Admitting: Emergency Medicine

## 2023-12-10 ENCOUNTER — Other Ambulatory Visit: Payer: Self-pay | Admitting: Internal Medicine

## 2023-12-10 ENCOUNTER — Telehealth: Payer: Self-pay

## 2023-12-10 ENCOUNTER — Other Ambulatory Visit (HOSPITAL_COMMUNITY): Payer: Self-pay

## 2023-12-10 DIAGNOSIS — K2100 Gastro-esophageal reflux disease with esophagitis, without bleeding: Secondary | ICD-10-CM

## 2023-12-10 NOTE — Telephone Encounter (Signed)
Reason for CRM: Steward Drone with [facility] called in to alert staff patient is requesting a prior authorization on his traMADol (ULTRAM) 50 MG tablet. Steward Drone is faxing a form to the clinic to be completed. Staff can also call to complete the prior authorization over the phone by calling [phone] option 5

## 2023-12-10 NOTE — Telephone Encounter (Signed)
Pharmacy Patient Advocate Encounter   Received notification from Pt Calls Messages that prior authorization for Tramadol 50mg  is required/requested.   Insurance verification completed.   The patient is insured through [facility] .   Per test claim: PA required; PA submitted to above mentioned insurance via Phone Key/confirmation #/EOC [id number] Status is pending   Faxed chart notes to [phone]

## 2023-12-12 ENCOUNTER — Other Ambulatory Visit: Payer: Self-pay | Admitting: Adult Health

## 2023-12-12 ENCOUNTER — Other Ambulatory Visit: Payer: Self-pay | Admitting: Emergency Medicine

## 2023-12-13 ENCOUNTER — Other Ambulatory Visit (HOSPITAL_COMMUNITY): Payer: Self-pay

## 2023-12-16 ENCOUNTER — Other Ambulatory Visit (HOSPITAL_COMMUNITY): Payer: Self-pay

## 2023-12-16 NOTE — Telephone Encounter (Signed)
Patient has been made aware and has already picked up his medication.

## 2023-12-16 NOTE — Telephone Encounter (Signed)
Pharmacy Patient Advocate Encounter  Received notification from [facility] that Prior Authorization for Tramadol 50mg  tab has been APPROVED from 12/10/23 to 12/09/24   PA #/Case ID/Reference #: [id number]  Approval letter indexed to media tab.

## 2023-12-17 NOTE — Progress Notes (Signed)
Remote pacemaker transmission.   

## 2023-12-20 ENCOUNTER — Ambulatory Visit (INDEPENDENT_AMBULATORY_CARE_PROVIDER_SITE_OTHER): Payer: Medicare Other | Admitting: Family Medicine

## 2023-12-20 ENCOUNTER — Encounter: Payer: Self-pay | Admitting: Family Medicine

## 2023-12-20 VITALS — BP 120/84 | HR 90 | Temp 98.1°F | Ht 70.0 in | Wt 232.2 lb

## 2023-12-20 DIAGNOSIS — K7030 Alcoholic cirrhosis of liver without ascites: Secondary | ICD-10-CM | POA: Diagnosis not present

## 2023-12-20 DIAGNOSIS — M7021 Olecranon bursitis, right elbow: Secondary | ICD-10-CM

## 2023-12-20 DIAGNOSIS — R110 Nausea: Secondary | ICD-10-CM | POA: Diagnosis not present

## 2023-12-20 DIAGNOSIS — R2232 Localized swelling, mass and lump, left upper limb: Secondary | ICD-10-CM

## 2023-12-21 ENCOUNTER — Encounter: Payer: Self-pay | Admitting: Physical Medicine & Rehabilitation

## 2023-12-21 ENCOUNTER — Encounter (HOSPITAL_BASED_OUTPATIENT_CLINIC_OR_DEPARTMENT_OTHER): Payer: Medicare Other | Admitting: Physical Medicine & Rehabilitation

## 2023-12-21 VITALS — BP 122/83 | HR 88 | Temp 97.9°F | Ht 70.0 in | Wt 228.0 lb

## 2023-12-21 DIAGNOSIS — G8929 Other chronic pain: Secondary | ICD-10-CM | POA: Diagnosis not present

## 2023-12-21 DIAGNOSIS — M533 Sacrococcygeal disorders, not elsewhere classified: Secondary | ICD-10-CM

## 2023-12-21 DIAGNOSIS — G629 Polyneuropathy, unspecified: Secondary | ICD-10-CM | POA: Diagnosis not present

## 2023-12-21 DIAGNOSIS — R200 Anesthesia of skin: Secondary | ICD-10-CM | POA: Diagnosis not present

## 2023-12-21 DIAGNOSIS — M545 Low back pain, unspecified: Secondary | ICD-10-CM | POA: Diagnosis not present

## 2023-12-21 DIAGNOSIS — M170 Bilateral primary osteoarthritis of knee: Secondary | ICD-10-CM | POA: Diagnosis not present

## 2023-12-21 MED ORDER — LIDOCAINE HCL (PF) 2 % IJ SOLN
2.0000 mL | Freq: Once | INTRAMUSCULAR | Status: AC
  Administered 2023-12-21: 2 mL

## 2023-12-21 MED ORDER — BETAMETHASONE SOD PHOS & ACET 6 (3-3) MG/ML IJ SUSP
6.0000 mg | Freq: Once | INTRAMUSCULAR | Status: AC
  Administered 2023-12-21: 6 mg via INTRA_ARTICULAR

## 2023-12-21 MED ORDER — LIDOCAINE HCL 1 % IJ SOLN
5.0000 mL | Freq: Once | INTRAMUSCULAR | Status: AC
  Administered 2023-12-21: 5 mL

## 2023-12-21 MED ORDER — IOHEXOL 180 MG/ML  SOLN
2.0000 mL | Freq: Once | INTRAMUSCULAR | Status: AC
  Administered 2023-12-21: 2 mL

## 2023-12-21 NOTE — Patient Instructions (Signed)
We have drained the fluid off of your right elbow.  Worsening the sample to the lab, will be in contact with any abnormal results that require further attention.  I have ordered an ultrasound for the lump on your left arm, someone will be reaching out to get you scheduled.  I think that your nausea may be related to excessive gas or possibly viral.  May try simethicone, if symptoms continue, please let me know.  I do not think this is related to cirrhosis given recent normal labs.  Follow-up with me for new or worsening symptoms.

## 2023-12-21 NOTE — Progress Notes (Signed)
Bilateral sacroiliac injections under fluoroscopic guidance  Indication: Low back and buttocks pain not relieved by medication management and other conservative care.  Informed consent was obtained after describing risks and benefits of the procedure with the patient, this includes bleeding, bruising, infection, paralysis and medication side effects. The patient wishes to proceed and has given written consent. The patient was placed in a prone position. The lumbar and sacral area was marked and prepped with Betadine. A 25-gauge 1-1/2 inch needle was inserted into the skin and subcutaneous tissue and 1 mL of 1% lidocaine was injected into each side. Then a 25-gauge 3 inch spinal needle was inserted under fluoroscopic guidance into the left sacroiliac joint. AP and lateral images were utilized. Omnipaque 180x0.5 mL under live fluoroscopy demonstrated no intravascular uptake. Then a solution containing one ML of 6 mg per mL Celestone in 2 ML of 2% lidocaine MPF was injected x1.5 mL. This same procedure was repeated on the right side using the same needle, injectate, and technique. Patient tolerated the procedure well. Post procedure instructions were given. Please see post procedure form.  Meds: Celestone 6mg  Omnipaque 1ml Lidocaine 1% 2ml Lidocaine 2% PF 2 ml

## 2023-12-21 NOTE — Patient Instructions (Signed)
Sacroiliac injection was performed today. A combination of numbing medicine (lidocaine) plus a cortisone medicine (betamethasone) was injected. The injection was done under x-ray guidance. This procedure has been performed to help reduce low back and buttocks pain as well as potentially hip pain. The duration of this injection is variable lasting from hours to  Months. It may repeated if needed. 

## 2023-12-23 ENCOUNTER — Ambulatory Visit: Payer: Medicare Other | Admitting: Nurse Practitioner

## 2023-12-26 LAB — SYNOVIAL FLUID ANALYSIS, COMPLETE
Basophils, %: 0 %
Eosinophils-Synovial: 0 % (ref 0–2)
Lymphocytes-Synovial Fld: 9 % (ref 0–74)
Monocyte/Macrophage: 49 % (ref 0–69)
Neutrophil, Synovial: 42 % — ABNORMAL HIGH (ref 0–24)
Synoviocytes, %: 0 % (ref 0–15)
WBC, Synovial: 826 {cells}/uL — ABNORMAL HIGH (ref <150)

## 2023-12-26 LAB — ANAEROBIC AND AEROBIC CULTURE: AER RESULT:: NO GROWTH

## 2023-12-28 ENCOUNTER — Other Ambulatory Visit: Payer: Self-pay | Admitting: Family Medicine

## 2023-12-28 DIAGNOSIS — J449 Chronic obstructive pulmonary disease, unspecified: Secondary | ICD-10-CM | POA: Diagnosis not present

## 2023-12-28 DIAGNOSIS — J9610 Chronic respiratory failure, unspecified whether with hypoxia or hypercapnia: Secondary | ICD-10-CM | POA: Diagnosis not present

## 2023-12-28 DIAGNOSIS — R2232 Localized swelling, mass and lump, left upper limb: Secondary | ICD-10-CM

## 2023-12-29 ENCOUNTER — Other Ambulatory Visit: Payer: Self-pay | Admitting: Internal Medicine

## 2023-12-29 ENCOUNTER — Telehealth: Payer: Self-pay | Admitting: Internal Medicine

## 2023-12-29 DIAGNOSIS — F411 Generalized anxiety disorder: Secondary | ICD-10-CM

## 2023-12-29 DIAGNOSIS — J449 Chronic obstructive pulmonary disease, unspecified: Secondary | ICD-10-CM | POA: Diagnosis not present

## 2023-12-29 NOTE — Progress Notes (Deleted)
 Subjective:    Patient ID: Robert Church, male    DOB: Jan 01, 1956, 68 y.o.   MRN: [id number]  HPI  Pain Inventory Average Pain {NUMBERS; 0-10:5044} Pain Right Now {NUMBERS; 0-10:5044} My pain is {PAIN DESCRIPTION:21022940}  In the last 24 hours, has pain interfered with the following? General activity {NUMBERS; 0-10:5044} Relation with others {NUMBERS; 0-10:5044} Enjoyment of life {NUMBERS; 0-10:5044} What TIME of day is your pain at its worst? {time of day:24191} Sleep (in general) {BHH GOOD/FAIR/POOR:22877}  Pain is worse with: {ACTIVITIES:21022942} Pain improves with: {PAIN IMPROVES TPUY:78977056} Relief from Meds: {NUMBERS; 0-10:5044}  {MOBILITY JIO:78977055}  {FUNCTION:21022946}  {NEURO/PSYCH:21022948}  {CPRM PRIOR STUDIES:21022953}  {CPRM PHYSICIANS INVOLVED IN YOUR CARE:21022954}    Family History  Problem Relation Age of Onset   Lung cancer Father    High blood pressure Mother    Diabetes Maternal Grandmother    Colon polyps Neg Hx    Esophageal cancer Neg Hx    Pancreatic cancer Neg Hx    Stomach cancer Neg Hx    Social History   Socioeconomic History   Marital status: Single    Spouse name: Not on file   Number of children: 1   Years of education: 12   Highest education level: 12th grade  Occupational History   Occupation: disabled  Tobacco Use   Smoking status: Former    Current packs/day: 0.00    Average packs/day: 2.0 packs/day for 46.0 years (92.0 ttl pk-yrs)    Types: Cigarettes    Start date: 01/28/1976    Quit date: 01/27/2022    Years since quitting: 1.9    Passive exposure: Past   Smokeless tobacco: Never   Tobacco comments:    Chews on a cigar.  09/16/2023 hfb  Vaping Use   Vaping status: Never Used  Substance and Sexual Activity   Alcohol use: Not Currently    Alcohol/week: 28.0 standard drinks of alcohol    Types: 28 Cans of beer per week   Drug use: No   Sexual activity: Yes    Partners: Female    Birth control/protection:  None  Other Topics Concern   Not on file  Social History Narrative   Right handed   Tea sometimes and coffee (1/2 caffeine)   Social Drivers of Health   Financial Resource Strain: High Risk (12/19/2023)   Overall Financial Resource Strain (CARDIA)    Difficulty of Paying Living Expenses: Hard  Food Insecurity: Food Insecurity Present (12/19/2023)   Hunger Vital Sign    Worried About Running Out of Food in the Last Year: Sometimes true    Ran Out of Food in the Last Year: Sometimes true  Transportation Needs: No Transportation Needs (12/19/2023)   PRAPARE - Administrator, Civil Service (Medical): No    Lack of Transportation (Non-Medical): No  Physical Activity: Unknown (11/04/2023)   Exercise Vital Sign    Days of Exercise per Week: 0 days    Minutes of Exercise per Session: Not on file  Stress: Stress Concern Present (11/04/2023)   [employer] of Occupational Health - Occupational Stress Questionnaire    Feeling of Stress : Rather much  Social Connections: Moderately Isolated (12/19/2023)   Social Connection and Isolation Panel [NHANES]    Frequency of Communication with Friends and Family: More than three times a week    Frequency of Social Gatherings with Friends and Family: More than three times a week    Attends Religious Services: 1 to 4 times per  year    Active Member of Clubs or Organizations: No    Attends Banker Meetings: Not on file    Marital Status: Divorced   Past Surgical History:  Procedure Laterality Date   BIV UPGRADE N/A 10/07/2021   Procedure: BIV PPM UPGRADE;  Surgeon: Inocencio Soyla Lunger, MD;  Location: [facility];  Service: Cardiovascular;  Laterality: N/A;   BIV UPGRADE N/A 11/11/2022   Procedure: BIV PPM UPGRADE;  Surgeon: Inocencio Soyla Lunger, MD;  Location: [facility];  Service: Cardiovascular;  Laterality: N/A;   LEFT HEART CATH AND CORONARY ANGIOGRAPHY N/A 04/19/2017   Procedure: Left Heart Cath  and Coronary Angiography;  Surgeon: Victory LELON Sharps, MD;  Location: [facility] INVASIVE CV LAB;  Service: Cardiovascular;  Laterality: N/A;   PACEMAKER IMPLANT N/A 04/16/2021   Procedure: PACEMAKER IMPLANT;  Surgeon: Inocencio Soyla Lunger, MD;  Location: [facility];  Service: Cardiovascular;  Laterality: N/A;   RIGHT HEART CATH N/A 03/09/2023   Procedure: RIGHT HEART CATH;  Surgeon: Cherrie Toribio SAUNDERS, MD;  Location: [facility];  Service: Cardiovascular;  Laterality: N/A;   RIGHT/LEFT HEART CATH AND CORONARY ANGIOGRAPHY N/A 08/22/2021   Procedure: RIGHT/LEFT HEART CATH AND CORONARY ANGIOGRAPHY;  Surgeon: Cherrie Toribio SAUNDERS, MD;  Location: [facility];  Service: Cardiovascular;  Laterality: N/A;   TONSILLECTOMY     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  Opioid Risk Score:   Fall Risk Score:  `1  Depression screen [facility] 2/9     12/21/2023   11:41 AM 12/02/2023    2:08 PM 11/29/2023    2:13 PM 11/11/2023    1:41 PM 05/20/2023    3:37 PM 05/04/2023    2:35 PM 03/17/2023    3:22 PM  Depression screen PHQ 2/9  Decreased Interest 0 0 2 2 0 0 0  Down, Depressed, Hopeless 0 0 0 0 0 0 0  PHQ - 2 Score 0 0 2 2 0 0 0  Altered sleeping    2     Tired, decreased energy    2     Change in appetite    0     Feeling bad or failure about yourself     0     Trouble concentrating    0     Moving slowly or fidgety/restless    2     Suicidal thoughts    0      PHQ-9 Score    8     Difficult doing work/chores    Somewhat difficult        Review of Systems     Objective:   Physical Exam        Assessment & Plan:

## 2023-12-29 NOTE — Telephone Encounter (Signed)
 Copied from CRM [id number]. Topic: Clinical - Medical Advice >> Dec 29, 2023 10:49 AM Corean SAUNDERS wrote: Reason for CRM: Patient states at his last appointment with Dr. Joshua they discussed a handicap scooter and that Dr. Joshua stated he will get in tough with a company to help him. Patient states he has not hear anything about that yet and is requesting an update as he can't walk anymore.

## 2023-12-29 NOTE — Telephone Encounter (Signed)
**Note De-identified  [place] Obfuscation** Please advise 

## 2023-12-30 ENCOUNTER — Encounter: Payer: Medicare Other | Admitting: Physical Medicine & Rehabilitation

## 2023-12-31 ENCOUNTER — Ambulatory Visit (HOSPITAL_COMMUNITY): Admission: RE | Admit: 2023-12-31 | Payer: Medicare Other | Source: Ambulatory Visit

## 2023-12-31 ENCOUNTER — Ambulatory Visit: Payer: Self-pay | Admitting: Internal Medicine

## 2023-12-31 DIAGNOSIS — R051 Acute cough: Secondary | ICD-10-CM | POA: Diagnosis not present

## 2023-12-31 DIAGNOSIS — R0602 Shortness of breath: Secondary | ICD-10-CM | POA: Diagnosis not present

## 2023-12-31 DIAGNOSIS — J189 Pneumonia, unspecified organism: Secondary | ICD-10-CM | POA: Diagnosis not present

## 2023-12-31 NOTE — Telephone Encounter (Signed)
  Chief Complaint: pain in ribs Symptoms: pain Frequency: talking/coughing  Disposition: [x] ED /[] Urgent Care (no appt availability in office) / [] Appointment(In office/virtual)/ []  [facility]/ [] Home Care/ [] Refused Recommended Disposition /[] [facility]/ []  Follow-up with PCP Additional Notes: Pt called with rib pain due to coughing yesterday. Pt said he heard something pop on left side. Pain is 10/10 when coughing. Unable to take deep breath. Pt stated it also hurts to talk. Per protocol, pt advised to go to ED. Pt refused. RN explained no appt can be made until after ED visit. RN suggested UC and pt stated he would go there. RN gave care advice and pt verbalized understanding.            Copied from CRM [id number]. Topic: Clinical - Red Word Triage >> Dec 31, 2023 11:14 AM Graeme ORN wrote: Red Word that prompted transfer to Nurse Triage: Possible broken ribs, coughing spell hard something pop, almost passes out from pain coughing Reason for Disposition  [1] Can't take a deep breath BUT [2] no respiratory distress  Answer Assessment - Initial Assessment Questions 1. MECHANISM: How did the injury happen?     Coughing  2. SET: When did the injury happen? (Minutes or hours ago)     Yesterday  3. LOCATION: Where on the chest is the injury located?     Hurts on left side 4. APPEARANCE: What does the injury look like?     denies 5. BLEEDING: Is there any bleeding now? If Yes, ask: How long has it been bleeding?     na 6. SEVERITY: Any difficulty with breathing?     little 7. SIZE: For cuts, bruises, or swelling, ask: How large is it? (e.g., inches or centimeters)     denies 8. PAIN: Is there pain? If Yes, ask: How bad is the pain?   (e.g., Scale 1-10; or mild, moderate, severe)     10- when coughing 9. TETANUS: For any breaks in the skin, ask: When was the last tetanus booster?     na  Protocols used: Chest Injury-A-AH

## 2024-01-02 ENCOUNTER — Other Ambulatory Visit: Payer: Self-pay | Admitting: Internal Medicine

## 2024-01-03 ENCOUNTER — Telehealth: Payer: Self-pay

## 2024-01-03 NOTE — Telephone Encounter (Signed)
 The pt would like for the nurse to review his transmission to see if he is retaining fluid. He has picked up some weight since he has pneumonia.

## 2024-01-03 NOTE — Telephone Encounter (Signed)
 Pt called to advise fluid level appears normal. Pt appreciative of call.

## 2024-01-03 NOTE — Telephone Encounter (Signed)
 Transmission received 01/03/2024

## 2024-01-04 ENCOUNTER — Ambulatory Visit: Payer: Medicare Other | Admitting: Cardiovascular Disease

## 2024-01-04 NOTE — Telephone Encounter (Signed)
 Copied from CRM [id number]. Topic: General - Other >> Jan 03, 2024  3:28 PM Drema MATSU wrote: Reason for CRM: Patient mom wants to know if the doctor and insurance company can help cover the cost of a lift chair for patient. Patients mom is requesting a callback.

## 2024-01-06 ENCOUNTER — Encounter: Payer: Medicare Other | Attending: Physical Medicine & Rehabilitation | Admitting: Physical Medicine & Rehabilitation

## 2024-01-06 DIAGNOSIS — M533 Sacrococcygeal disorders, not elsewhere classified: Secondary | ICD-10-CM | POA: Insufficient documentation

## 2024-01-06 DIAGNOSIS — M170 Bilateral primary osteoarthritis of knee: Secondary | ICD-10-CM | POA: Insufficient documentation

## 2024-01-06 DIAGNOSIS — G8929 Other chronic pain: Secondary | ICD-10-CM | POA: Insufficient documentation

## 2024-01-06 DIAGNOSIS — R200 Anesthesia of skin: Secondary | ICD-10-CM | POA: Insufficient documentation

## 2024-01-06 DIAGNOSIS — G629 Polyneuropathy, unspecified: Secondary | ICD-10-CM | POA: Insufficient documentation

## 2024-01-06 DIAGNOSIS — M545 Low back pain, unspecified: Secondary | ICD-10-CM | POA: Insufficient documentation

## 2024-01-07 ENCOUNTER — Ambulatory Visit (INDEPENDENT_AMBULATORY_CARE_PROVIDER_SITE_OTHER): Payer: Medicare Other | Admitting: Family Medicine

## 2024-01-07 ENCOUNTER — Encounter: Payer: Self-pay | Admitting: Family Medicine

## 2024-01-07 ENCOUNTER — Ambulatory Visit (INDEPENDENT_AMBULATORY_CARE_PROVIDER_SITE_OTHER): Payer: Medicare Other

## 2024-01-07 VITALS — BP 100/70 | HR 64 | Temp 98.2°F | Ht 70.0 in | Wt 234.0 lb

## 2024-01-07 DIAGNOSIS — I700 Atherosclerosis of aorta: Secondary | ICD-10-CM | POA: Diagnosis not present

## 2024-01-07 DIAGNOSIS — J189 Pneumonia, unspecified organism: Secondary | ICD-10-CM

## 2024-01-07 DIAGNOSIS — K7030 Alcoholic cirrhosis of liver without ascites: Secondary | ICD-10-CM | POA: Diagnosis not present

## 2024-01-07 DIAGNOSIS — S301XXA Contusion of abdominal wall, initial encounter: Secondary | ICD-10-CM | POA: Diagnosis not present

## 2024-01-07 DIAGNOSIS — J9611 Chronic respiratory failure with hypoxia: Secondary | ICD-10-CM | POA: Diagnosis not present

## 2024-01-07 DIAGNOSIS — Z950 Presence of cardiac pacemaker: Secondary | ICD-10-CM | POA: Diagnosis not present

## 2024-01-07 DIAGNOSIS — T148XXA Other injury of unspecified body region, initial encounter: Secondary | ICD-10-CM

## 2024-01-07 DIAGNOSIS — R110 Nausea: Secondary | ICD-10-CM | POA: Diagnosis not present

## 2024-01-07 DIAGNOSIS — J431 Panlobular emphysema: Secondary | ICD-10-CM

## 2024-01-07 DIAGNOSIS — J90 Pleural effusion, not elsewhere classified: Secondary | ICD-10-CM | POA: Diagnosis not present

## 2024-01-07 LAB — COMPREHENSIVE METABOLIC PANEL
ALT: 29 U/L (ref 0–53)
AST: 37 U/L (ref 0–37)
Albumin: 3.6 g/dL (ref 3.5–5.2)
Alkaline Phosphatase: 45 U/L (ref 39–117)
BUN: 26 mg/dL — ABNORMAL HIGH (ref 6–23)
CO2: 33 meq/L — ABNORMAL HIGH (ref 19–32)
Calcium: 8.4 mg/dL (ref 8.4–10.5)
Chloride: 99 meq/L (ref 96–112)
Creatinine, Ser: 0.88 mg/dL (ref 0.40–1.50)
GFR: 90.32 mL/min (ref >60.00)
Glucose, Bld: 109 mg/dL — ABNORMAL HIGH (ref 70–99)
Potassium: 4.1 meq/L (ref 3.5–5.1)
Sodium: 139 meq/L (ref 135–145)
Total Bilirubin: 0.7 mg/dL (ref 0.2–1.2)
Total Protein: 6.2 g/dL (ref 6.0–8.3)

## 2024-01-07 LAB — CBC WITH DIFFERENTIAL/PLATELET
Basophils Absolute: 0 10*3/uL (ref 0.0–0.1)
Basophils Relative: 0.3 % (ref 0.0–3.0)
Eosinophils Absolute: 0 10*3/uL (ref 0.0–0.7)
Eosinophils Relative: 0.5 % (ref 0.0–5.0)
HCT: 42 % (ref 39.0–52.0)
Hemoglobin: 14.1 g/dL (ref 13.0–17.0)
Lymphocytes Relative: 6.2 % — ABNORMAL LOW (ref 12.0–46.0)
Lymphs Abs: 0.5 10*3/uL — ABNORMAL LOW (ref 0.7–4.0)
MCHC: 33.5 g/dL (ref 30.0–36.0)
MCV: 96.5 fL (ref 78.0–100.0)
Monocytes Absolute: 0.5 10*3/uL (ref 0.1–1.0)
Monocytes Relative: 5.9 % (ref 3.0–12.0)
Neutro Abs: 7.3 10*3/uL (ref 1.4–7.7)
Neutrophils Relative %: 87.1 % — ABNORMAL HIGH (ref 43.0–77.0)
Platelets: 279 10*3/uL (ref 150.0–400.0)
RBC: 4.35 Mil/uL (ref 4.22–5.81)
RDW: 13.9 % (ref 11.5–15.5)
WBC: 8.4 10*3/uL (ref 4.0–10.5)

## 2024-01-07 MED ORDER — DOXYCYCLINE HYCLATE 100 MG PO CAPS: 100.0000 mg | ORAL_CAPSULE | Freq: Two times a day (BID) | ORAL | 0 refills | Status: AC

## 2024-01-07 MED ORDER — ONDANSETRON 4 MG PO TBDP: 4.0000 mg | ORAL_TABLET | Freq: Three times a day (TID) | ORAL | 0 refills | Status: DC | PRN

## 2024-01-07 NOTE — Patient Instructions (Signed)
I have sent in a prescription for doxycycline for you to take twice a day for 7 days.  Take this medication with food as it can upset your stomach if you do not.  I have sent in Zofran for you to take 1 tablet every 8 hours as needed for nausea and vomiting.  We are getting an xray today. We will be in contact with any abnormal results that require further attention.  We are checking labs today, will be in contact with any results that require further attention  If labs are abnormal, and I am concerned about sepsis or an acute kidney injury, any thing else that is really concerning to me I will be calling you tonight and would like you to go to the hospital for follow-up

## 2024-01-07 NOTE — Progress Notes (Signed)
Acute Office Visit  Subjective:     Patient ID: KEDWIN WHITECOTTON, male    DOB: [birth year], 68 y.o.   MRN: [id number]  Chief Complaint  Patient presents with   Bleeding/Bruising    Severe coughing (notes of phlegm but nothing breaking up) from pneumonia causing bruising on lower left abdomen/flank. Also experiencing increased bilateral edema in legs and ankles (has taken medicine for fluid retention today but has not urinated today). Pain within the center of the chest and back, wants to make sure he is not experiencing pancreatitis      HPI Patient is in today for evaluation of bruising to the left flank, fatigue, decreased urinary output, recent pneumonia. States that he was seen at urgent care on 12/31/2023, notes reviewed by me. Was diagnosed with CAP of bilateral lower lobes. Treated with 500 mg Levaquin once daily, azithromycin, prednisone. Has been wearing home oxygen. States that he took the Levaquin for 3 days, after that he cannot tolerate it anymore. Cannot tolerate azithromycin, has not been taking that. Has been taking prednisone. Concerned that he may have a broken rib from coughing Reports his stool has also been the last week or 2. Denies other concerns today    ROS Per HPI      Objective:    BP 100/70   Pulse 64   Temp 98.2 F (36.8 C)   Ht 5\' 10"  (1.778 m)   Wt 234 lb (106.1 kg)   SpO2 92%   BMI 33.58 kg/m    Physical Exam Vitals and nursing note reviewed.  Constitutional:      General: He is not in acute distress.    Appearance: He is obese. He is ill-appearing.  HENT:     Head: Normocephalic and atraumatic.     Nose: Congestion present.     Mouth/Throat:     Mouth: Mucous membranes are moist.     Pharynx: Oropharynx is clear. No oropharyngeal exudate or posterior oropharyngeal erythema.  Eyes:     Extraocular Movements: Extraocular movements intact.  Cardiovascular:     Rate and Rhythm: Normal rate and regular rhythm.  Pulmonary:      Effort: Pulmonary effort is normal. No respiratory distress.     Breath sounds: No stridor. Wheezing, rhonchi and rales present.  Abdominal:     General: There is distension.  Musculoskeletal:        General: Tenderness (L lower posterior ribs) present.     Cervical back: Normal range of motion.  Lymphadenopathy:     Cervical: Cervical adenopathy present.  Skin:    Findings: Bruising present.     Comments: Left flank, about 10 x 15 cm area of bruising, non tender, no discharge  Neurological:     General: No focal deficit present.     Mental Status: He is alert and oriented to person, place, and time.  Psychiatric:        Mood and Affect: Mood normal.        Behavior: Behavior normal.     No results found for any visits on 01/07/24.      Assessment & Plan:  1. Community acquired pneumonia, unspecified laterality (Primary)  - CBC with Differential/Platelet - doxycycline (VIBRAMYCIN) 100 MG capsule; Take 1 capsule (100 mg total) by mouth 2 (two) times daily for 7 days.  Dispense: 14 capsule; Refill: 0 - DG Ribs Unilateral W/Chest Left; Future  2. Bruising  - Comprehensive metabolic panel - DG Ribs Unilateral W/Chest Left; Future  3. Nausea  - ondansetron (ZOFRAN-ODT) 4 MG disintegrating tablet; Take 1 tablet (4 mg total) by mouth every 8 (eight) hours as needed for nausea or vomiting.  Dispense: 20 tablet; Refill: 0  4. Alcoholic cirrhosis of liver without ascites (HCC)  - CBC with Differential/Platelet - Comprehensive metabolic panel  5. Panlobular emphysema (HCC)  - CBC with Differential/Platelet - Comprehensive metabolic panel - DG Ribs Unilateral W/Chest Left; Future  6. Chronic respiratory failure with hypoxia (HCC)  - CBC with Differential/Platelet - Comprehensive metabolic panel -Continue home O2 use  -Discussed that he would likely be best treated in the ER for further evaluation, declined -Discussed concern for sepsis with pneumonia, acute kidney  injury given length of illness, possible organ injury given bruising to the left flank  Meds ordered this encounter  Medications   doxycycline (VIBRAMYCIN) 100 MG capsule    Sig: Take 1 capsule (100 mg total) by mouth 2 (two) times daily for 7 days.    Dispense:  14 capsule    Refill:  0   ondansetron (ZOFRAN-ODT) 4 MG disintegrating tablet    Sig: Take 1 tablet (4 mg total) by mouth every 8 (eight) hours as needed for nausea or vomiting.    Dispense:  20 tablet    Refill:  0    Return if symptoms worsen or fail to improve.  Moshe Cipro, FNP

## 2024-01-09 ENCOUNTER — Other Ambulatory Visit: Payer: Self-pay | Admitting: Emergency Medicine

## 2024-01-12 ENCOUNTER — Other Ambulatory Visit: Payer: Self-pay | Admitting: Internal Medicine

## 2024-01-12 ENCOUNTER — Ambulatory Visit: Payer: Medicare Other

## 2024-01-12 VITALS — Ht 70.0 in | Wt 241.0 lb

## 2024-01-12 DIAGNOSIS — Z0000 Encounter for general adult medical examination without abnormal findings: Secondary | ICD-10-CM | POA: Diagnosis not present

## 2024-01-12 DIAGNOSIS — E118 Type 2 diabetes mellitus with unspecified complications: Secondary | ICD-10-CM

## 2024-01-12 NOTE — Patient Instructions (Signed)
Mr. Boyter , Thank you for taking time to come for your Medicare Wellness Visit. I appreciate your ongoing commitment to your health goals. Please review the following plan we discussed and let me know if I can assist you in the future.   Referrals/Orders/Follow-Ups/Clinician Recommendations: It was nice talking to you today.  You are due for a Tetanus vaccine and a diabetic eye exam.   I requested a referral from Dr. Yetta Barre for   This is a list of the screening recommended for you and due dates:  Health Maintenance  Topic Date Due   Eye exam for diabetics  Never done   Zoster (Shingles) Vaccine (1 of 2) 02/08/2024*   Flu Shot  03/20/2024*   DTaP/Tdap/Td vaccine (2 - Td or Tdap) 11/07/2024*   Colon Cancer Screening  11/07/2024*   Screening for Lung Cancer  03/16/2024   Complete foot exam   03/16/2024   Hemoglobin A1C  05/08/2024   Yearly kidney health urinalysis for diabetes  11/08/2024   Yearly kidney function blood test for diabetes  01/06/2025   Medicare Annual Wellness Visit  01/11/2025   Pneumonia Vaccine  Completed   Hepatitis C Screening  Completed   HIV Screening  Completed   HPV Vaccine  Aged Out   COVID-19 Vaccine  Discontinued  *Topic was postponed. The date shown is not the original due date.    Advanced directives: (Copy Requested) Please bring a copy of your health care power of attorney and living will to the office to be added to your chart at your convenience.  Next Medicare Annual Wellness Visit scheduled for next year: Yes

## 2024-01-12 NOTE — Addendum Note (Signed)
Addended by: Wyvonne Lenz on: 01/12/2024 03:23 PM   Modules accepted: Level of Service

## 2024-01-12 NOTE — Progress Notes (Signed)
Subjective:   Robert Church is a 68 y.o. male who presents for an Initial Medicare Annual Wellness Visit.  Visit Complete: Virtual I connected with  Robert Church on 01/12/24 by a audio enabled telemedicine application and verified that I am speaking with the correct person using two identifiers.  Patient Location: Home  Provider Location: Office/Clinic  I discussed the limitations of evaluation and management by telemedicine. The patient expressed understanding and agreed to proceed.  Vital Signs: Because this visit was a virtual/telehealth visit, some criteria may be missing or patient reported. Any vitals not documented were not able to be obtained and vitals that have been documented are patient reported.  Patient Medicare AWV questionnaire was completed by the patient on 01/11/2024; I have confirmed that all information answered by patient is correct and no changes since this date.  Cardiac Risk Factors include: advanced age (>10men, >48 women);hypertension;Other (see comment);diabetes mellitus;dyslipidemia, Risk factor comments: CHF, NICM, OSA, COPD,Atherosclerosis of aorta     Objective:    Today's Vitals   01/12/24 1355  Weight: 241 lb (109.3 kg)  Height: 5\' 10"  (1.778 m)   Body mass index is 34.58 kg/m.     01/12/2024    2:39 PM 04/26/2023    2:56 PM 04/26/2023    2:50 PM 03/09/2023   10:12 AM 11/15/2022   10:54 AM 11/11/2022    1:13 PM 05/21/2022    7:24 PM  Advanced Directives  Does Patient Have a Medical Advance Directive? Yes No No No Yes Yes Yes  Type of Advance Directive Living will    Living will Living will Living will  Does patient want to make changes to medical advance directive?      No - Patient declined   Would patient like information on creating a medical advance directive?  No - Patient declined  No - Patient declined       Current Medications (verified) Outpatient Encounter Medications as of 01/12/2024  Medication Sig   albuterol (PROVENTIL) (2.5  MG/3ML) 0.083% nebulizer solution TAKE 3 MLS BY NEBULIZATION EVERY 4 HOURS AS NEEDED FOR WHEEZING OR SHORTNESS OF BREATH   allopurinol (ZYLOPRIM) 300 MG tablet Take 1 tablet (300 mg total) by mouth daily.   ALPRAZolam (XANAX) 0.5 MG tablet TAKE 1 TABLET(0.5 MG) BY MOUTH THREE TIMES DAILY   DALIRESP 500 MCG TABS tablet TAKE 1/2 TABLET(250 MCG) BY MOUTH TWICE DAILY   diclofenac Sodium (VOLTAREN) 1 % GEL Apply 1 Application topically in the morning and at bedtime.   doxycycline (VIBRAMYCIN) 100 MG capsule Take 1 capsule (100 mg total) by mouth 2 (two) times daily for 7 days.   EPINEPHrine 0.3 mg/0.3 mL IJ SOAJ injection Inject 0.3 mg into the muscle as needed for anaphylaxis.   fluticasone (FLONASE) 50 MCG/ACT nasal spray SHAKE LIQUID AND USE 2 SPRAYS IN EACH NOSTRIL DAILY (Patient taking differently: Place 1 spray into both nostrils daily as needed for allergies.)   gabapentin (NEURONTIN) 300 MG capsule Take 1 capsule (300 mg total) by mouth 3 (three) times daily.   guaiFENesin (MUCINEX) 600 MG 12 hr tablet Take 1 tablet (600 mg total) by mouth 2 (two) times daily. (Patient taking differently: Take 600 mg by mouth 2 (two) times daily as needed for cough or to loosen phlegm.)   ipratropium (ATROVENT) 0.06 % nasal spray Place 2 sprays into both nostrils 4 (four) times daily as needed for rhinitis.   ipratropium-albuterol (DUONEB) 0.5-2.5 (3) MG/3ML SOLN Take 3 mLs by nebulization every  4 (four) hours as needed.   Lidocaine, Anorectal, 5 % CREA Apply 1 application topically 2 (two) times daily. (Patient taking differently: Apply 1 application  topically daily as needed (Fissure).)   loratadine (CLARITIN) 10 MG tablet Take 10 mg by mouth every other day.   losartan (COZAAR) 25 MG tablet Take 1 tablet (25 mg total) by mouth daily.   metolazone (ZAROXOLYN) 2.5 MG tablet Take 1 tablet (2.5 mg total) by mouth as directed.   mometasone-formoterol (DULERA) 100-5 MCG/ACT AERO INHALE 2 PUFFS INTO THE LUNGS TWICE  DAILY   montelukast (SINGULAIR) 10 MG tablet TAKE 1 TABLET BY MOUTH EVERY DAY AT BEDTIME   nicotine (NICODERM CQ - DOSED IN MG/24 HOURS) 14 mg/24hr patch Place 1 patch (14 mg total) onto the skin daily.   omeprazole (PRILOSEC) 20 MG capsule TAKE 1 CAPSULE(20 MG) BY MOUTH TWICE DAILY BEFORE A MEAL FOR STOMACH ACID OR REFLUX   ondansetron (ZOFRAN-ODT) 4 MG disintegrating tablet Take 1 tablet (4 mg total) by mouth every 8 (eight) hours as needed for nausea or vomiting.   potassium chloride SA (KLOR-CON M20) 20 MEQ tablet Take 1 tablet (20 mEq total) by mouth daily. Take an extra 40 meq when you take metolazone   predniSONE (DELTASONE) 10 MG tablet Take 1 tablet (10 mg total) by mouth daily with breakfast.   Testosterone 20.25 MG/1.25GM (1.62%) GEL Place 1.25 g onto the skin daily. (Patient taking differently: Place 1 Pump onto the skin every other day.)   Tiotropium Bromide Monohydrate (SPIRIVA RESPIMAT) 2.5 MCG/ACT AERS Inhale 2 puffs into the lungs daily.   VENTOLIN HFA 108 (90 Base) MCG/ACT inhaler INHALE 2 PUFFS INTO THE LUNGS EVERY 6 HOURS AS NEEDED FOR WHEEZING   vitamin B-12 (CYANOCOBALAMIN) 1000 MCG tablet Take 1,000 mcg by mouth daily.   torsemide (DEMADEX) 20 MG tablet Take 2 tablets (40 mg total) by mouth 2 (two) times daily. (Patient taking differently: Take 20 mg by mouth daily. Take 2 tablets (40mg ) by mouth  daily)   No facility-administered encounter medications on file as of 01/12/2024.    Allergies (verified) Bee venom, Cefdinir, Spironolactone, Bactrim [sulfamethoxazole-trimethoprim], Daliresp [roflumilast], Jardiance [empagliflozin], Penicillins, Clindamycin, Doxycycline, E-mycin [erythromycin base], and Rosuvastatin   History: Past Medical History:  Diagnosis Date   Adenomatous colon polyp    Allergy    Anal fissure    Arthritis    Asthma    Bronchitis    CHF (congestive heart failure) (HCC)    COPD, group D, by GOLD 2017 classification (HCC)    Emphysema lung (HCC)     GERD (gastroesophageal reflux disease)    History of hiatal hernia    Hyperlipidemia    Hypertension    NICM (nonischemic cardiomyopathy) (HCC)    OSA (obstructive sleep apnea)    Pneumonia    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   Requires supplemental oxygen    Past Surgical History:  Procedure Laterality Date   BIV UPGRADE N/A 10/07/2021   Procedure: BIV PPM UPGRADE;  Surgeon: Regan Lemming, MD;  Location: [facility] INVASIVE CV [facility];  Service: Cardiovascular;  Laterality: N/A;   BIV UPGRADE N/A 11/11/2022   Procedure: BIV PPM UPGRADE;  Surgeon: Regan Lemming, MD;  Location: [facility] CV [facility];  Service: Cardiovascular;  Laterality: N/A;   LEFT HEART CATH AND CORONARY ANGIOGRAPHY N/A 04/19/2017   Procedure: Left Heart Cath and Coronary Angiography;  Surgeon: Lyn Records, MD;  Location: [facility] INVASIVE CV LAB;  Service: Cardiovascular;  Laterality: N/A;   PACEMAKER IMPLANT N/A 04/16/2021   Procedure: PACEMAKER IMPLANT;  Surgeon: Regan Lemming, MD;  Location: [facility] INVASIVE CV [facility];  Service: Cardiovascular;  Laterality: N/A;   RIGHT HEART CATH N/A 03/09/2023   Procedure: RIGHT HEART CATH;  Surgeon: Dolores Patty, MD;  Location: [facility];  Service: Cardiovascular;  Laterality: N/A;   RIGHT/LEFT HEART CATH AND CORONARY ANGIOGRAPHY N/A 08/22/2021   Procedure: RIGHT/LEFT HEART CATH AND CORONARY ANGIOGRAPHY;  Surgeon: Dolores Patty, MD;  Location: [facility];  Service: Cardiovascular;  Laterality: N/A;   TONSILLECTOMY     Family History  Problem Relation Age of Onset   Lung cancer Father    High blood pressure Mother    Diabetes Maternal Grandmother    Colon polyps Neg Hx    Esophageal cancer Neg Hx    Pancreatic cancer Neg Hx    Stomach cancer Neg Hx    Social History   Socioeconomic History   Marital status: Single    Spouse name: Not on file   Number of children: 1   Years of education: 12   Highest education level: 12th grade   Occupational History   Occupation: disabled  Tobacco Use   Smoking status: Former    Current packs/day: 0.00    Average packs/day: 2.0 packs/day for 46.0 years (92.0 ttl pk-yrs)    Types: Cigarettes    Start date: 01/28/1976    Quit date: 01/27/2022    Years since quitting: 1.9    Passive exposure: Past   Smokeless tobacco: Never   Tobacco comments:    Chews on a cigar.  09/16/2023 hfb  Vaping Use   Vaping status: Never Used  Substance and Sexual Activity   Alcohol use: Not Currently    Alcohol/week: 28.0 standard drinks of alcohol    Types: 28 Cans of beer per week   Drug use: No   Sexual activity: Yes    Partners: Female    Birth control/protection: None  Other Topics Concern   Not on file  Social History Narrative   Right handed   Tea sometimes and coffee (1/2 caffeine)   Lives alone-2025   Social Drivers of Health   Financial Resource Strain: Medium Risk (01/12/2024)   Overall Financial Resource Strain (CARDIA)    Difficulty of Paying Living Expenses: Somewhat hard  Food Insecurity: No Food Insecurity (01/12/2024)   Hunger Vital Sign    Worried About Running Out of Food in the Last Year: Never true    Ran Out of Food in the Last Year: Never true  Recent Concern: Food Insecurity - Food Insecurity Present (12/19/2023)   Hunger Vital Sign    Worried About Running Out of Food in the Last Year: Sometimes true    Ran Out of Food in the Last Year: Sometimes true  Transportation Needs: No Transportation Needs (01/12/2024)   PRAPARE - Administrator, Civil Service (Medical): No    Lack of Transportation (Non-Medical): No  Physical Activity: Inactive (01/12/2024)   Exercise Vital Sign    Days of Exercise per Week: 0 days    Minutes of Exercise per Session: 0 min  Stress: No Stress Concern Present (01/12/2024)   [employer] of Occupational Health - Occupational Stress Questionnaire    Feeling of Stress : Not at all  Recent Concern: Stress - Stress Concern  Present (11/04/2023)   [employer] of Occupational Health - Occupational Stress Questionnaire  Feeling of Stress : Rather much  Social Connections: Socially Isolated (01/12/2024)   Social Connection and Isolation Panel [NHANES]    Frequency of Communication with Friends and Family: More than three times a week    Frequency of Social Gatherings with Friends and Family: Three times a week    Attends Religious Services: Never    Active Member of Clubs or Organizations: No    Attends Banker Meetings: Never    Marital Status: Divorced    Tobacco Counseling Counseling given: Not Answered Tobacco comments: Chews on a cigar.  09/16/2023 hfb   Clinical Intake:  Pre-visit preparation completed: Yes  Pain : No/denies pain     BMI - recorded: 34.58 Nutritional Risks: None Diabetes: Yes CBG done?: No Did pt. bring in CBG monitor from home?: No  How often do you need to have someone help you when you read instructions, pamphlets, or other written materials from your doctor or pharmacy?: 1 - Never  Interpreter Needed?: No  Information entered by :: Rease Wence, RMA   Activities of Daily Living    01/11/2024    8:49 AM 06/02/2023    1:00 PM  In your present state of health, do you have any difficulty performing the following activities:  Hearing? 0 0  Vision? 0 0  Difficulty concentrating or making decisions? 0 0  Walking or climbing stairs? 1 0  Dressing or bathing? 1 0  Doing errands, shopping? 1 0  Preparing Food and eating ? N   Using the Toilet? N   In the past six months, have you accidently leaked urine? Y   Do you have problems with loss of bowel control? N   Managing your Medications? N   Managing your Finances? N   Housekeeping or managing your Housekeeping? Y     Patient Care Team: [name], MD as PCP - General (Internal Medicine) Runell Gess, MD as PCP - Cardiology (Cardiology) Regan Lemming, MD as PCP -  Electrophysiology (Cardiology) Bonita Quin, [facility] (Pharmacist)  Indicate any recent Medical Services you may have received from other than [facility] providers in the past year (date may be approximate).     Assessment:   This is a routine wellness examination for [place].  Hearing/Vision screen Hearing Screening - Comments:: Denies hearing difficulties   Vision Screening - Comments:: Denies vision issues.    Goals Addressed   None   Depression Screen    01/12/2024    2:42 PM 12/21/2023   11:41 AM 12/02/2023    2:08 PM 11/29/2023    2:13 PM 11/11/2023    1:41 PM 05/20/2023    3:37 PM 05/04/2023    2:35 PM  PHQ 2/9 Scores  PHQ - 2 Score 0 0 0 2 2 0 0  PHQ- 9 Score 0    8      Fall Risk    01/11/2024    8:49 AM 12/21/2023   11:41 AM 12/02/2023    2:08 PM 11/29/2023    2:12 PM 11/15/2023   11:01 AM  Fall Risk   Church in the past year? 0 0 0 0 0  Number Church in past yr:  0  0   Injury with Fall? 0 0     Follow up Church evaluation completed;Church prevention discussed        MEDICARE RISK AT HOME: Medicare Risk at Home Any stairs in or around the home?: (Patient-Rptd) No Home free of loose throw rugs  in walkways, pet beds, electrical cords, etc?: (Patient-Rptd) No Adequate lighting in your home to reduce risk of Church?: (Patient-Rptd) Yes Life alert?: (Patient-Rptd) No Use of a cane, walker or w/c?: (Patient-Rptd) Yes Grab bars in the bathroom?: (Patient-Rptd) No Shower chair or bench in shower?: (Patient-Rptd) Yes Elevated toilet seat or a handicapped toilet?: (Patient-Rptd) No  TIMED UP AND GO:  Was the test performed? NO  Cognitive Function:        Immunizations Immunization History  Administered Date(s) Administered   Influenza,inj,Quad PF,6+ Mos 09/17/2021   PNEUMOCOCCAL CONJUGATE-20 08/20/2021   Tdap 12/03/2011    TDAP status: Due, Education has been provided regarding the importance of this vaccine. Advised may receive this vaccine at local  pharmacy or Health Dept. Aware to provide a copy of the vaccination record if obtained from local pharmacy or Health Dept. Verbalized acceptance and understanding.  Flu Vaccine status: Declined, Education has been provided regarding the importance of this vaccine but patient still declined. Advised may receive this vaccine at local pharmacy or Health Dept. Aware to provide a copy of the vaccination record if obtained from local pharmacy or Health Dept. Verbalized acceptance and understanding.  Pneumococcal vaccine status: Up to date  Covid-19 vaccine status: Declined, Education has been provided regarding the importance of this vaccine but patient still declined. Advised may receive this vaccine at local pharmacy or Health Dept.or vaccine clinic. Aware to provide a copy of the vaccination record if obtained from local pharmacy or Health Dept. Verbalized acceptance and understanding.  Qualifies for Shingles Vaccine? Yes   Zostavax completed No   Shingrix Completed?: No.    Education has been provided regarding the importance of this vaccine. Patient has been advised to call insurance company to determine out of pocket expense if they have not yet received this vaccine. Advised may also receive vaccine at local pharmacy or Health Dept. Verbalized acceptance and understanding.  Screening Tests Health Maintenance  Topic Date Due   OPHTHALMOLOGY EXAM  Never done   Zoster Vaccines- Shingrix (1 of 2) 02/08/2024 (Originally 08/21/1975)   INFLUENZA VACCINE  03/20/2024 (Originally 07/22/2023)   DTaP/Tdap/Td (2 - Td or Tdap) 11/07/2024 (Originally 12/02/2021)   Colonoscopy  11/07/2024 (Originally 12/10/2019)   Lung Cancer Screening  03/16/2024   FOOT EXAM  03/16/2024   HEMOGLOBIN A1C  05/08/2024   Diabetic kidney evaluation - Urine ACR  11/08/2024   Diabetic kidney evaluation - eGFR measurement  01/06/2025   Medicare Annual Wellness (AWV)  01/11/2025   Pneumonia Vaccine 70+ Years old  Completed    Hepatitis C Screening  Completed   HIV Screening  Completed   HPV VACCINES  Aged Out   COVID-19 Vaccine  Discontinued    Health Maintenance  Health Maintenance Due  Topic Date Due   OPHTHALMOLOGY EXAM  Never done    Colorectal cancer screening: Type of screening: Colonoscopy. Completed 12/09/2017. Repeat every 2 years  Lung Cancer Screening: (Low Dose CT Chest recommended if Age 44-80 years, 20 pack-year currently smoking OR have quit w/in 15years.) does not qualify.   Lung Cancer Screening Referral: N/A  Additional Screening:  Hepatitis C Screening: does qualify; Completed 08/07/2020  Vision Screening: Recommended annual ophthalmology exams for early detection of glaucoma and other disorders of the eye. Is the patient up to date with their annual eye exam?  No  Who is the provider or what is the name of the office in which the patient attends annual eye exams? Need a referral  If pt  is not established with a provider, would they like to be referred to a provider to establish care? Yes .   Dental Screening: Recommended annual dental exams for proper oral hygiene  Diabetic Foot Exam: Diabetic Foot Exam: Completed 03/17/2023  Community Resource Referral / Chronic Care Management: CRR required this visit?  No   CCM required this visit?  No    Plan:     I have personally reviewed and noted the following in the patient's chart:   Medical and social history Use of alcohol, tobacco or illicit drugs  Current medications and supplements including opioid prescriptions. Patient is not currently taking opioid prescriptions. Functional ability and status Nutritional status Physical activity Advanced directives List of other physicians Hospitalizations, surgeries, and ER visits in previous 12 months Vitals Screenings to include cognitive, depression, and Church Referrals and appointments  In addition, I have reviewed and discussed with patient certain preventive protocols,  quality metrics, and best practice recommendations. A written personalized care plan for preventive services as well as general preventive health recommendations were provided to patient.     Mekai Wilkinson L Icel Castles, CMA   01/12/2024   After Visit Summary: (MyChart) Due to this being a telephonic visit, the after visit summary with patients personalized plan was offered to patient via MyChart   Nurse Notes: Patient is due for a Tdap and an eye exam.  He is requesting a referral for a ophthalmologist.  Patient is also due for a colonoscopy, however, he did inform me that he had a Cologuard kit at home and will get it turned in after he feels better. Patient also express concerns about U/S and CT results.  I informed patient that Dr. Yetta Barre will let him know results as soon as he receive them.  Patient voiced understanding.  He had no other concerns today.

## 2024-01-13 NOTE — Telephone Encounter (Signed)
Copied from CRM [id number]. Topic: Clinical - Lab/Test Results >> Jan 13, 2024  2:16 PM Taleah C wrote: Reason for CRM: pt called back to follow-up on x-ray results. I informed him that his chart revealed they were not ready yet. He asked for another message to be sent back for someone to let him know what is delaying the process. Please callback and advise.

## 2024-01-14 ENCOUNTER — Other Ambulatory Visit: Payer: Self-pay | Admitting: Emergency Medicine

## 2024-01-14 NOTE — Telephone Encounter (Signed)
Called and spoke with [facility] who is working on getting the reading for this x-ray.

## 2024-01-14 NOTE — Telephone Encounter (Signed)
Copied from CRM [id number]. Topic: Clinical - Lab/Test Results >> Jan 14, 2024  1:25 PM Robert Church wrote: Reason for CRM: Patient calling to obtain xray results that were done on Friday 1/17, please reach out to patient.  Robert Church [phone]

## 2024-01-15 ENCOUNTER — Encounter: Payer: Self-pay | Admitting: Emergency Medicine

## 2024-01-16 ENCOUNTER — Encounter: Payer: Self-pay | Admitting: Family Medicine

## 2024-01-17 NOTE — Telephone Encounter (Signed)
Addressed in separate note

## 2024-01-20 ENCOUNTER — Ambulatory Visit (HOSPITAL_COMMUNITY)
Admission: RE | Admit: 2024-01-20 | Discharge: 2024-01-20 | Disposition: A | Discharge status: Home / Self Care | Payer: Medicare Other | Source: Ambulatory Visit | Attending: Adult Health | Admitting: Adult Health

## 2024-01-20 VITALS — BP 114/74 | HR 91 | Wt 246.8 lb

## 2024-01-20 DIAGNOSIS — R110 Nausea: Secondary | ICD-10-CM | POA: Diagnosis not present

## 2024-01-20 DIAGNOSIS — E669 Obesity, unspecified: Secondary | ICD-10-CM | POA: Diagnosis not present

## 2024-01-20 DIAGNOSIS — Z6835 Body mass index (BMI) 35.0-35.9, adult: Secondary | ICD-10-CM | POA: Insufficient documentation

## 2024-01-20 DIAGNOSIS — I428 Other cardiomyopathies: Secondary | ICD-10-CM | POA: Diagnosis not present

## 2024-01-20 DIAGNOSIS — Z79899 Other long term (current) drug therapy: Secondary | ICD-10-CM | POA: Diagnosis not present

## 2024-01-20 DIAGNOSIS — I5022 Chronic systolic (congestive) heart failure: Secondary | ICD-10-CM | POA: Insufficient documentation

## 2024-01-20 DIAGNOSIS — Z7901 Long term (current) use of anticoagulants: Secondary | ICD-10-CM | POA: Diagnosis not present

## 2024-01-20 DIAGNOSIS — R682 Dry mouth, unspecified: Secondary | ICD-10-CM | POA: Insufficient documentation

## 2024-01-20 DIAGNOSIS — I5042 Chronic combined systolic (congestive) and diastolic (congestive) heart failure: Secondary | ICD-10-CM | POA: Diagnosis not present

## 2024-01-20 DIAGNOSIS — R42 Dizziness and giddiness: Secondary | ICD-10-CM | POA: Insufficient documentation

## 2024-01-20 DIAGNOSIS — J4489 Other specified chronic obstructive pulmonary disease: Secondary | ICD-10-CM | POA: Insufficient documentation

## 2024-01-20 DIAGNOSIS — I110 Hypertensive heart disease with heart failure: Secondary | ICD-10-CM | POA: Diagnosis not present

## 2024-01-20 DIAGNOSIS — G4733 Obstructive sleep apnea (adult) (pediatric): Secondary | ICD-10-CM | POA: Insufficient documentation

## 2024-01-20 LAB — BASIC METABOLIC PANEL
Anion gap: 14 (ref 5–15)
BUN: 17 mg/dL (ref 8–23)
CO2: 30 mmol/L (ref 22–32)
Calcium: 8.6 mg/dL — ABNORMAL LOW (ref 8.9–10.3)
Chloride: 96 mmol/L — ABNORMAL LOW (ref 98–111)
Creatinine, Ser: 0.84 mg/dL (ref 0.61–1.24)
GFR, Estimated: >60 mL/min (ref >60)
Glucose, Bld: 134 mg/dL — ABNORMAL HIGH (ref 70–99)
Potassium: 3.4 mmol/L — ABNORMAL LOW (ref 3.5–5.1)
Sodium: 140 mmol/L (ref 135–145)

## 2024-01-20 MED ORDER — METOLAZONE 2.5 MG PO TABS: 2.5000 mg | ORAL_TABLET | ORAL | 11 refills | Status: DC

## 2024-01-20 MED ORDER — TORSEMIDE 20 MG PO TABS: 40.0000 mg | ORAL_TABLET | Freq: Every day | ORAL | 11 refills | Status: AC

## 2024-01-20 NOTE — Patient Instructions (Signed)
Take torsemide 60 mg daily. Take metolazone 2.5 mg twice weekly  ONLY. Take on Monday and Friday. Lab today - will call you if abnormal. Return to Heart Failure Clinic in two weeks - see below. Please call us at [phone] if any questions or concerns prior to your next visit.

## 2024-01-20 NOTE — Progress Notes (Signed)
ADVANCED HF CLINIC NOTE  HF MD: Dr. Gala Romney  Pulmonology: Dr Delton Coombes   Chief Complaint: Heart Failure/Leg Edema  HPI: Mr. Magnussen is a 68 y.o. male with h/o obesity, HTN, COPD with ongoing tobacco use and chronic systolic heart failure.    Cath 03/2017 - normal coronaries with EF 40% and global HK.  He saw Dr. Allyson Sabal on 07/01/2018 because of recurrent chest pain.  Myoview stress test performed 07/21/2018 showed EF 38% inferior scar with septal ischemia.  Echo 06/2018  EF of 30 to 35%.  He was referred for cardiac CT.   Admitted 1/22 for acute on chronic hypoxic respiratory failure secondary to acute COPD and CHF w/ volume overload. He had massive diuresis w/ IV Lasix, diuresed down from admit wt of 265 lb to 210 lb. Echo 12/21, EF 40-45% (unchanged). RV mildly reduced, RVSP 49.    In 4/22, had near syncope, Zio showed intermittent CHB. Repeat echo EF 30-35%  Had St Jude dual chamber PM 04/16/21. Had some PMT and device adjusted by EP .   Underwent R/L cath in 9/22 for persistent SOB. No CAD. EF 35% Mildly elevated filling pressures with normal output.   Subsequently underwent CRT-D upgrade attempt on 10/07/21 by Dr. Elberta Fortis due to high-degree of RV pacing. 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   S/p Barostim 2/23  S/p BiV upgrade 11/11/22. 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   Started feeling bad about 3 weeks ago after he went to a [place]. Says he got sick after that. Treated for pneumonia complicated by rib fractures. He was eatilng lots of soup. He completed antbiotics 01/17/24.   Today he returns for HF follow up. Having ongoing cough. SOB with exertion. Denies PND. + Orthopnea. Complaining of leg swelling. Appetite ok. Had a hot dog last night. Gets food delivered.  No fever or chills. Weight at home 239 pounds. He has not been taking losartan. Taking metolazone every other day.  Drives to appointments but needs help walking.    Cardiac studies: Echo 06/2018  LVEF 30-35%, RV normal  Echo 12/2018 LVEF 40-45%, RV normal  Echo 11/2020 LVEF 40-45%, RV mildly reduced Echo 03/2021 30-35%, RV mildly reduced  Echo 07/2021 EF 30-35% RV moderately reduced  Echo 01/13/23 EF < 20%, RV mildly down  RHC 3/24 RA = 7 RV = 37/11 PA = 39/20 (28) PCW = 13 Fick cardiac output/index = 5.2/2.3 PVR = 2.9 WU FA sat = 91% PA sat = 63% Review of systems complete and found to be negative unless listed in HPI.    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   Requires supplemental oxygen     Current Outpatient Medications  Medication Sig Dispense Refill   albuterol (PROVENTIL) (2.5 MG/3ML) 0.083% nebulizer solution TAKE 3 MLS BY NEBULIZATION EVERY 4 HOURS AS NEEDED FOR WHEEZING OR SHORTNESS OF BREATH 150  mL 11   ALPRAZolam (XANAX) 0.5 MG tablet TAKE 1 TABLET(0.5 MG) BY MOUTH THREE TIMES DAILY 270 tablet 0   DALIRESP 500 MCG TABS tablet TAKE 1 TABLET(500 MCG) BY MOUTH DAILY 30 tablet 11   diclofenac Sodium (VOLTAREN) 1 % GEL Apply 1 Application topically in the morning and at bedtime.     fluticasone (FLONASE) 50 MCG/ACT nasal spray SHAKE LIQUID AND USE 2 SPRAYS IN EACH NOSTRIL DAILY (Patient taking differently: Place 1 spray into both nostrils daily as needed for allergies.) 16 g 2   guaiFENesin (MUCINEX) 600 MG 12 hr tablet Take 1 tablet (600 mg total) by mouth 2 (two) times daily. (Patient taking differently: Take 600 mg by mouth 2 (two) times daily as needed for cough or to loosen phlegm. Pt taking once daily) 60  tablet 5   ipratropium-albuterol (DUONEB) 0.5-2.5 (3) MG/3ML SOLN Take 3 mLs by nebulization every 4 (four) hours as needed. 360 mL 5   metolazone (ZAROXOLYN) 2.5 MG tablet Take 1 tablet (2.5 mg total) by mouth as directed. 5 tablet 6   mometasone-formoterol (DULERA) 100-5 MCG/ACT AERO INHALE 2 PUFFS INTO THE LUNGS TWICE DAILY 13 g 5   montelukast (SINGULAIR) 10 MG tablet TAKE 1 TABLET BY MOUTH EVERY DAY AT BEDTIME 30 tablet 5   nicotine (NICODERM CQ - DOSED IN MG/24 HOURS) 14 mg/24hr patch Place 1 patch (14 mg total) onto the skin daily. 28 patch 0   omeprazole (PRILOSEC) 20 MG capsule TAKE 1 CAPSULE(20 MG) BY MOUTH TWICE DAILY BEFORE A MEAL FOR STOMACH ACID OR REFLUX 180 capsule 0   potassium chloride SA (KLOR-CON M20) 20 MEQ tablet Take 1 tablet (20 mEq total) by mouth daily. Take an extra 40 meq when you take metolazone 90 tablet 3   predniSONE (DELTASONE) 10 MG tablet Take 1 tablet (10 mg total) by mouth daily with breakfast. 30 tablet 5   Tiotropium Bromide Monohydrate (SPIRIVA RESPIMAT) 2.5 MCG/ACT AERS Inhale 2 puffs into the lungs daily. 4 g 6   VENTOLIN HFA 108 (90 Base) MCG/ACT inhaler INHALE 2 PUFFS INTO THE LUNGS EVERY 6 HOURS AS NEEDED FOR WHEEZING 18 g 4   vitamin B-12 (CYANOCOBALAMIN) 1000 MCG tablet Take 1,000 mcg by mouth daily.     vitamin E 180 MG (400 UNITS) capsule Take 400 Units by mouth daily.     allopurinol (ZYLOPRIM) 300 MG tablet Take 1 tablet (300 mg total) by mouth daily. (Patient not taking: Reported on 01/20/2024) 90 tablet 0   EPINEPHrine 0.3 mg/0.3 mL IJ SOAJ injection Inject 0.3 mg into the muscle as needed for anaphylaxis. (Patient not taking: Reported on 01/20/2024) 1 each 0   gabapentin (NEURONTIN) 300 MG capsule Take 1 capsule (300 mg total) by mouth 3 (three) times daily. (Patient not taking: Reported on 01/20/2024) 90 capsule 4   ipratropium (ATROVENT) 0.06 % nasal spray Place 2 sprays into both nostrils 4 (four) times daily as needed for rhinitis. (Patient not  taking: Reported on 01/20/2024) 45 mL 3   Lidocaine, Anorectal, 5 % CREA Apply 1 application topically 2 (two) times daily. (Patient not taking: Reported on 01/20/2024) 30 g 1   loratadine (CLARITIN) 10 MG tablet Take 10 mg by mouth every other day. (Patient not taking: Reported on 01/20/2024)     ondansetron (ZOFRAN-ODT) 4 MG disintegrating tablet Take 1 tablet (4 mg total) by mouth every 8 (eight) hours as needed for nausea or vomiting. (Patient not taking: Reported on 01/20/2024) 20 tablet 0  Testosterone 20.25 MG/1.25GM (1.62%) GEL Place 1.25 g onto the skin daily. (Patient not taking: Reported on 01/20/2024) 112.5 g 0   torsemide (DEMADEX) 20 MG tablet Take 2 tablets (40 mg total) by mouth 2 (two) times daily. (Patient taking differently: Take 20 mg by mouth 2 (two) times daily.) 120 tablet 1   No current facility-administered medications for this encounter.   Allergies  Allergen Reactions   Bee Venom Shortness Of Breath and Swelling   Cefdinir Other (See Comments)    Patient states he couldn't really breath    Spironolactone Anaphylaxis   Bactrim [Sulfamethoxazole-Trimethoprim] Other (See Comments)    Mouth burning   Daliresp [Roflumilast] Other (See Comments)    Dizzy, headache, leg pain per patient. 02/25/22. Tolerates in low doses    Jardiance [Empagliflozin] Nausea Only and Other (See Comments)    Lightheadness Dizziness   Penicillins Swelling and Other (See Comments)    Childhood rxn--MD stated he "almost died" Has patient had a PCN reaction causing immediate rash, facial/tongue/throat swelling, SOB or lightheadedness with hypotension:Yes Has patient had a PCN reaction causing severe rash involving mucus membranes or skin necrosis:unsure Has patient had a PCN reaction that required hospitalization:unsure Has patient had a PCN reaction occurring within the last 10 years:No If all of the above answers are "NO", then may proceed with Cephalosporin use.     Clindamycin Other (See  Comments)    Tongue swelling   Doxycycline Nausea And Vomiting    Severe stomach upset per patient   E-Mycin [Erythromycin Base] Swelling   Rosuvastatin Other (See Comments)    Myalgias (intolerance)   Social History   Socioeconomic History   Marital status: Single    Spouse name: Not on file   Number of children: 1   Years of education: 46   Highest education level: 12th grade  Occupational History   Occupation: disabled  Tobacco Use   Smoking status: Former    Current packs/day: 0.00    Average packs/day: 2.0 packs/day for 46.0 years (92.0 ttl pk-yrs)    Types: Cigarettes    Start date: 01/28/1976    Quit date: 01/27/2022    Years since quitting: 1.9    Passive exposure: Past   Smokeless tobacco: Never   Tobacco comments:    Chews on a cigar.  09/16/2023 hfb  Vaping Use   Vaping status: Never Used  Substance and Sexual Activity   Alcohol use: Not Currently    Alcohol/week: 28.0 standard drinks of alcohol    Types: 28 Cans of beer per week   Drug use: No   Sexual activity: Yes    Partners: Female    Birth control/protection: None  Other Topics Concern   Not on file  Social History Narrative   Right handed   Tea sometimes and coffee (1/2 caffeine)   Lives alone-2025   Social Drivers of Health   Financial Resource Strain: Medium Risk (01/12/2024)   Overall Financial Resource Strain (CARDIA)    Difficulty of Paying Living Expenses: Somewhat hard  Food Insecurity: No Food Insecurity (01/12/2024)   Hunger Vital Sign    Worried About Running Out of Food in the Last Year: Never true    Ran Out of Food in the Last Year: Never true  Recent Concern: Food Insecurity - Food Insecurity Present (12/19/2023)   Hunger Vital Sign    Worried About Running Out of Food in the Last Year: Sometimes true    Ran Out of Food in the Last Year:  Sometimes true  Transportation Needs: No Transportation Needs (01/12/2024)   PRAPARE - Administrator, Civil Service (Medical): No     Lack of Transportation (Non-Medical): No  Physical Activity: Inactive (01/12/2024)   Exercise Vital Sign    Days of Exercise per Week: 0 days    Minutes of Exercise per Session: 0 min  Stress: No Stress Concern Present (01/12/2024)   [employer] of Occupational Health - Occupational Stress Questionnaire    Feeling of Stress : Not at all  Recent Concern: Stress - Stress Concern Present (11/04/2023)   [employer] of Occupational Health - Occupational Stress Questionnaire    Feeling of Stress : Rather much  Social Connections: Socially Isolated (01/12/2024)   Social Connection and Isolation Panel [NHANES]    Frequency of Communication with Friends and Family: More than three times a week    Frequency of Social Gatherings with Friends and Family: Three times a week    Attends Religious Services: Never    Active Member of Clubs or Organizations: No    Attends Banker Meetings: Never    Marital Status: Divorced  Catering manager Violence: Not At Risk (06/02/2023)   Humiliation, Afraid, Rape, and Kick questionnaire    Fear of Current or Ex-Partner: No    Emotionally Abused: No    Physically Abused: No    Sexually Abused: No   Family History  Problem Relation Age of Onset   Lung cancer Father    High blood pressure Mother    Diabetes Maternal Grandmother    Colon polyps Neg Hx    Esophageal cancer Neg Hx    Pancreatic cancer Neg Hx    Stomach cancer Neg Hx    Wt Readings from Last 3 Encounters:  01/20/24 111.9 kg (246 lb 12.8 oz)  01/12/24 109.3 kg (241 lb)  01/07/24 106.1 kg (234 lb)   BP 114/74   Pulse 91   Wt 111.9 kg (246 lb 12.8 oz)   SpO2 92%   BMI 35.41 kg/m  Wt Readings from Last 3 Encounters:  01/20/24 111.9 kg (246 lb 12.8 oz)  01/12/24 109.3 kg (241 lb)  01/07/24 106.1 kg (234 lb)    PHYSICAL EXAM: General:  Appears weak.  No resp difficulty Neck: supple. JVP 11-12 . Carotids 2+ bilat; no bruits. No lymphadenopathy or thryomegaly  appreciated. Cor: PMI nondisplaced. Regular rate & rhythm. No rubs, gallops or murmurs. Lungs: clear Abdomen: soft, nontender, nondistended.  Extremities: no cyanosis, clubbing, rash, R and LLE 2+ edema Neuro: alert & orientedx3, cranial nerves grossly intact. moves all 4 extremities w/o difficulty. Affect pleasant   ASSESSMENT & PLAN: 1. Chronic systolic HF due to NICM  - Suspect NICM due to HTN and inadequately treated OSA (He now reports improved nightly compliance w/ CPAP). - Echo 1/22 EF 40-45%. RV mildly reduced. RVSP 49 - Echo 07/31/21 EF 30-35 % and RV moderately reduced.  - R/LHC 9/22 No CAD EF 35%, Mildly elevated filling pressures with normal output -BiV upgrade 11/11/22. 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  - s/p Barostim. - Echo 1/24 EF < 20% - RHC 3/24  RA 7 PA 39/20 (28) PCW 13 Fick 5.2/2.3 - NYHA III. Marked volume overload. Suspect due to to diet. Discussed escalating diuretic regimen. We discussed Furoscix however he wants to try increasing torsemide.  - Today he was instructed to take torsemide 60 mg daily and metolazone twice a week. We discussed low salt food choices.  GDMT limited  as noted below.  - Intolerant losartan due to dizziness  - - No spiro (H/o Anaphylaxis).  - Intolerant to Jardiance (positional dizziness and nausea).  - Intolerant bisoprolol due to fatigue, coreg due to dizziness, and toprol due to dry mouth.  - Check BMET   2.  HTN Stable. Continue current regimen.    3. OSA Continue Bipap every night.   4. Obesity Body mass index is 35.41 kg/m. Discussed portion control.   Follow up in 2 weeks to reassess volume status. If no improvement will need to consider Furoscix.   Graycee Greeson Filbert Schilder, NP-C  3:28 PM

## 2024-01-21 ENCOUNTER — Telehealth (HOSPITAL_COMMUNITY): Payer: Self-pay

## 2024-01-21 DIAGNOSIS — I5042 Chronic combined systolic (congestive) and diastolic (congestive) heart failure: Secondary | ICD-10-CM

## 2024-01-21 MED ORDER — POTASSIUM CHLORIDE CRYS ER 20 MEQ PO TBCR: 40.0000 meq | EXTENDED_RELEASE_TABLET | Freq: Two times a day (BID) | ORAL | 5 refills | Status: DC

## 2024-01-21 NOTE — Telephone Encounter (Signed)
-----   Message from Tonye Becket sent at 01/20/2024  4:57 PM EST ----- Renal function stable. No change.  Potassium low. Instruct to take 40 meq of potassium twice daily.

## 2024-01-21 NOTE — Telephone Encounter (Signed)
Patient's potassium medication has been changed and updated in pt's chart and sent to his pharmacy. Pt aware, agreeable, and verbalized understanding

## 2024-01-26 ENCOUNTER — Telehealth: Payer: Self-pay | Admitting: Adult Health

## 2024-01-26 MED ORDER — DOXYCYCLINE HYCLATE 100 MG PO TABS: 100.0000 mg | ORAL_TABLET | Freq: Two times a day (BID) | ORAL | 0 refills | Status: DC

## 2024-01-26 MED ORDER — PREDNISONE 10 MG PO TABS: ORAL_TABLET | ORAL | 0 refills | Status: DC

## 2024-01-26 NOTE — Telephone Encounter (Signed)
 Called and spoke to patient.  He stated that he was dx PNA 3 weeks ago. He completed course of abx.  C/o prod cough with yellow sputum and increased SOB x3wk. Sx have improved some. He had 5 tablets of doxy on hand. He is questioning if he should start it.  He wears 4L at bedtime and as needed during the oxygen . Spo2 is maintaining between 89-91% on roomair.  He is using albuterol  HFA 3-5x daily, duoneb once daily, spiriva  one puff BID and dulera  BID.   Dr. Shelah, please advise. Thanks

## 2024-01-26 NOTE — Telephone Encounter (Signed)
 Please give him a full course doxy > 100mg  bid x 7 days  And also pred taper > Take 40mg  daily for 3 days, then 30mg  daily for 3 days, then 20mg  daily for 3 days, then 10mg  daily for 3 days, then stop

## 2024-01-26 NOTE — Telephone Encounter (Signed)
 Pt is aware of below message/recommendations and voiced his understanding.  Prednisone  and doxy has been sent to preferred pharmacy.  Nothing further needed.

## 2024-01-28 DIAGNOSIS — J449 Chronic obstructive pulmonary disease, unspecified: Secondary | ICD-10-CM | POA: Diagnosis not present

## 2024-01-28 DIAGNOSIS — J9610 Chronic respiratory failure, unspecified whether with hypoxia or hypercapnia: Secondary | ICD-10-CM | POA: Diagnosis not present

## 2024-01-29 DIAGNOSIS — J449 Chronic obstructive pulmonary disease, unspecified: Secondary | ICD-10-CM | POA: Diagnosis not present

## 2024-02-02 ENCOUNTER — Ambulatory Visit: Payer: Medicare Other | Admitting: Family Medicine

## 2024-02-02 ENCOUNTER — Telehealth (INDEPENDENT_AMBULATORY_CARE_PROVIDER_SITE_OTHER): Payer: Medicare Other | Admitting: Family Medicine

## 2024-02-02 ENCOUNTER — Encounter: Payer: Self-pay | Admitting: Family Medicine

## 2024-02-02 ENCOUNTER — Telehealth: Payer: Self-pay

## 2024-02-02 DIAGNOSIS — T148XXA Other injury of unspecified body region, initial encounter: Secondary | ICD-10-CM | POA: Diagnosis not present

## 2024-02-02 DIAGNOSIS — M546 Pain in thoracic spine: Secondary | ICD-10-CM

## 2024-02-02 DIAGNOSIS — M940 Chondrocostal junction syndrome [Tietze]: Secondary | ICD-10-CM | POA: Diagnosis not present

## 2024-02-02 MED ORDER — METHOCARBAMOL 500 MG PO TABS: 500.0000 mg | ORAL_TABLET | Freq: Three times a day (TID) | ORAL | 1 refills | Status: AC | PRN

## 2024-02-02 NOTE — Telephone Encounter (Signed)
Spoke with patients mom regarding appointment today, patient requesting virtual that is okay per Moshe Cipro. Patient to give [place] a call whenever he is able to do the visit, will work him in earlier if needed.

## 2024-02-02 NOTE — Progress Notes (Deleted)
   Acute Office Visit  Subjective:     Patient ID: Robert Church, male    DOB: 1956-04-05, 68 y.o.   MRN: [id number]  No chief complaint on file.   HPI Patient is in today for ***  ROS Per HPI      Objective:    There were no vitals taken for this visit.   Physical Exam Vitals and nursing note reviewed.  HENT:     Head: Normocephalic and atraumatic.     Nose: Congestion present.     Mouth/Throat:     Mouth: Mucous membranes are moist.     Pharynx: Oropharynx is clear. No oropharyngeal exudate or posterior oropharyngeal erythema.  Eyes:     Extraocular Movements: Extraocular movements intact.  Cardiovascular:     Rate and Rhythm: Normal rate and regular rhythm.  Pulmonary:     Effort: Pulmonary effort is normal.  Musculoskeletal:     Cervical back: Normal range of motion.  Lymphadenopathy:     Cervical: Cervical adenopathy present.  Neurological:     Mental Status: He is alert.   No results found for any visits on 02/02/24.      Assessment & Plan:  ***  No orders of the defined types were placed in this encounter.   No follow-ups on file.  Moshe Cipro, FNP

## 2024-02-02 NOTE — Patient Instructions (Addendum)
I have sent in methocarbamol for you take at night to help with muscle spasms. This medication can make you sleepy. Do not drive or operate heavy machinery while taking this medication.  May use heat or ice to the area for relief as needed.  May use topical rubs or gels to the area as needed for relief.  Attached subtle stretching exercises to help relieve pain and strengthen muscles.  Follow-up with me for new or worsening symptoms.

## 2024-02-02 NOTE — Progress Notes (Signed)
Virtual Visit via Video Note  I connected with Robert Church on 02/02/24 at  4:20 PM EST by a video enabled telemedicine application and verified that I am speaking with the correct person using two identifiers.  Patient Location: Home Provider Location: Office/Clinic  I discussed the limitations, risks, security, and privacy concerns of performing an evaluation and management service by video and the availability of in person appointments. I also discussed with the patient that there may be a patient responsible charge related to this service. The patient expressed understanding and agreed to proceed.  Subjective: PCP: [name], MD  Chief Complaint  Patient presents with   Acute Visit    Pain underneath left shoulder, moving around to his back. Thinks he may have pulled a muscle, has a burning or pulling like sensation when he coughs   HPI Reports pain to R thoracic back. States he is concerned that he may have pulled a muscle from coughing. Has recently had pneumonia, broken rib. He has been having to sleep in recliner so that he can breathe at night. Has been taking motrin for pain with little relief. Denies burning sensation to the area, injury, bruising. Denies other concerns today. Med hx as below.  ROS: Per HPI  Current Outpatient Medications:    albuterol (PROVENTIL) (2.5 MG/3ML) 0.083% nebulizer solution, TAKE 3 MLS BY NEBULIZATION EVERY 4 HOURS AS NEEDED FOR WHEEZING OR SHORTNESS OF BREATH, Disp: 150 mL, Rfl: 11   allopurinol (ZYLOPRIM) 300 MG tablet, Take 1 tablet (300 mg total) by mouth daily., Disp: 90 tablet, Rfl: 0   ALPRAZolam (XANAX) 0.5 MG tablet, TAKE 1 TABLET(0.5 MG) BY MOUTH THREE TIMES DAILY, Disp: 270 tablet, Rfl: 0   DALIRESP 500 MCG TABS tablet, TAKE 1 TABLET(500 MCG) BY MOUTH DAILY, Disp: 30 tablet, Rfl: 11   diclofenac Sodium (VOLTAREN) 1 % GEL, Apply 1 Application topically in the morning and at bedtime., Disp: , Rfl:    doxycycline  (VIBRA-TABS) 100 MG tablet, Take 1 tablet (100 mg total) by mouth 2 (two) times daily., Disp: 14 tablet, Rfl: 0   EPINEPHrine 0.3 mg/0.3 mL IJ SOAJ injection, Inject 0.3 mg into the muscle as needed for anaphylaxis., Disp: 1 each, Rfl: 0   fluticasone (FLONASE) 50 MCG/ACT nasal spray, SHAKE LIQUID AND USE 2 SPRAYS IN EACH NOSTRIL DAILY (Patient taking differently: Place 1 spray into both nostrils daily as needed for allergies.), Disp: 16 g, Rfl: 2   gabapentin (NEURONTIN) 300 MG capsule, Take 1 capsule (300 mg total) by mouth 3 (three) times daily., Disp: 90 capsule, Rfl: 4   guaiFENesin (MUCINEX) 600 MG 12 hr tablet, Take 1 tablet (600 mg total) by mouth 2 (two) times daily. (Patient taking differently: Take 600 mg by mouth 2 (two) times daily as needed for cough or to loosen phlegm. Pt taking once daily), Disp: 60 tablet, Rfl: 5   ipratropium (ATROVENT) 0.06 % nasal spray, Place 2 sprays into both nostrils 4 (four) times daily as needed for rhinitis., Disp: 45 mL, Rfl: 3   ipratropium-albuterol (DUONEB) 0.5-2.5 (3) MG/3ML SOLN, Take 3 mLs by nebulization every 4 (four) hours as needed., Disp: 360 mL, Rfl: 5   Lidocaine, Anorectal, 5 % CREA, Apply 1 application topically 2 (two) times daily., Disp: 30 g, Rfl: 1   loratadine (CLARITIN) 10 MG tablet, Take 10 mg by mouth every other day., Disp: , Rfl:    methocarbamol (ROBAXIN) 500 MG tablet, Take 1 tablet (500 mg total) by mouth  every 8 (eight) hours as needed for muscle spasms., Disp: 30 tablet, Rfl: 1   metolazone (ZAROXOLYN) 2.5 MG tablet, Take 1 tablet (2.5 mg total) by mouth 2 (two) times a week. Take 2.5 mg on Monday/Friday, Disp: 8 tablet, Rfl: 11   mometasone-formoterol (DULERA) 100-5 MCG/ACT AERO, INHALE 2 PUFFS INTO THE LUNGS TWICE DAILY, Disp: 13 g, Rfl: 5   montelukast (SINGULAIR) 10 MG tablet, TAKE 1 TABLET BY MOUTH EVERY DAY AT BEDTIME, Disp: 30 tablet, Rfl: 5   nicotine (NICODERM CQ - DOSED IN MG/24 HOURS) 14 mg/24hr patch, Place 1 patch  (14 mg total) onto the skin daily., Disp: 28 patch, Rfl: 0   omeprazole (PRILOSEC) 20 MG capsule, TAKE 1 CAPSULE(20 MG) BY MOUTH TWICE DAILY BEFORE A MEAL FOR STOMACH ACID OR REFLUX, Disp: 180 capsule, Rfl: 0   ondansetron (ZOFRAN-ODT) 4 MG disintegrating tablet, Take 1 tablet (4 mg total) by mouth every 8 (eight) hours as needed for nausea or vomiting., Disp: 20 tablet, Rfl: 0   potassium chloride SA (KLOR-CON M20) 20 MEQ tablet, Take 2 tablets (40 mEq total) by mouth 2 (two) times daily., Disp: 120 tablet, Rfl: 5   predniSONE (DELTASONE) 10 MG tablet, Take 1 tablet (10 mg total) by mouth daily with breakfast., Disp: 30 tablet, Rfl: 5   predniSONE (DELTASONE) 10 MG tablet, 4tabx3d,3tabx3d,,2tabx3d,1tabx3d, Disp: 30 tablet, Rfl: 0   Testosterone 20.25 MG/1.25GM (1.62%) GEL, Place 1.25 g onto the skin daily., Disp: 112.5 g, Rfl: 0   Tiotropium Bromide Monohydrate (SPIRIVA RESPIMAT) 2.5 MCG/ACT AERS, Inhale 2 puffs into the lungs daily., Disp: 4 g, Rfl: 6   torsemide (DEMADEX) 20 MG tablet, Take 2 tablets (40 mg total) by mouth daily., Disp: 90 tablet, Rfl: 11   VENTOLIN HFA 108 (90 Base) MCG/ACT inhaler, INHALE 2 PUFFS INTO THE LUNGS EVERY 6 HOURS AS NEEDED FOR WHEEZING, Disp: 18 g, Rfl: 4   vitamin B-12 (CYANOCOBALAMIN) 1000 MCG tablet, Take 1,000 mcg by mouth daily., Disp: , Rfl:    vitamin E 180 MG (400 UNITS) capsule, Take 400 Units by mouth daily., Disp: , Rfl:   Observations/Objective: There were no vitals filed for this visit. Physical Exam Vitals and nursing note reviewed.  Constitutional:      General: He is not in acute distress. HENT:     Head: Normocephalic and atraumatic.  Eyes:     Extraocular Movements: Extraocular movements intact.  Pulmonary:     Effort: Pulmonary effort is normal.  Musculoskeletal:     Cervical back: Normal range of motion.  Neurological:     General: No focal deficit present.     Mental Status: He is alert and oriented to person, place, and time.   Psychiatric:        Mood and Affect: Mood normal.        Behavior: Behavior normal.     Assessment and Plan: Acute left-sided thoracic back pain -     Methocarbamol; Take 1 tablet (500 mg total) by mouth every 8 (eight) hours as needed for muscle spasms.  Dispense: 30 tablet; Refill: 1  Costochondritis -     Methocarbamol; Take 1 tablet (500 mg total) by mouth every 8 (eight) hours as needed for muscle spasms.  Dispense: 30 tablet; Refill: 1  Muscle strain -     Methocarbamol; Take 1 tablet (500 mg total) by mouth every 8 (eight) hours as needed for muscle spasms.  Dispense: 30 tablet; Refill: 1    Follow Up Instructions: Return if  symptoms worsen or fail to improve.   I discussed the assessment and treatment plan with the patient. The patient was provided an opportunity to ask questions, and all were answered. The patient agreed with the plan and demonstrated an understanding of the instructions.   The patient was advised to call back or seek an in-person evaluation if the symptoms worsen or if the condition fails to improve as anticipated.  The above assessment and management plan was discussed with the patient. The patient verbalized understanding of and has agreed to the management plan.   Moshe Cipro, FNP

## 2024-02-03 ENCOUNTER — Telehealth: Payer: Self-pay | Admitting: Adult Health

## 2024-02-03 DIAGNOSIS — J449 Chronic obstructive pulmonary disease, unspecified: Secondary | ICD-10-CM

## 2024-02-03 NOTE — Telephone Encounter (Signed)
Patient states needs Nebulizer machine supplies. Patient uses [employer] for Nebulizer machine. Patient phone number is [phone].

## 2024-02-04 ENCOUNTER — Ambulatory Visit (HOSPITAL_COMMUNITY)
Admission: RE | Admit: 2024-02-04 | Discharge: 2024-02-04 | Disposition: A | Discharge status: Home / Self Care | Payer: Medicare Other | Source: Ambulatory Visit | Attending: Cardiology | Admitting: Cardiology

## 2024-02-04 ENCOUNTER — Telehealth (HOSPITAL_COMMUNITY): Payer: Self-pay

## 2024-02-04 ENCOUNTER — Telehealth: Payer: Self-pay | Admitting: Cardiovascular Disease

## 2024-02-04 ENCOUNTER — Encounter (HOSPITAL_COMMUNITY): Payer: Self-pay

## 2024-02-04 VITALS — BP 132/88 | HR 94 | Wt 234.4 lb

## 2024-02-04 DIAGNOSIS — I428 Other cardiomyopathies: Secondary | ICD-10-CM | POA: Insufficient documentation

## 2024-02-04 DIAGNOSIS — G4733 Obstructive sleep apnea (adult) (pediatric): Secondary | ICD-10-CM | POA: Insufficient documentation

## 2024-02-04 DIAGNOSIS — I5042 Chronic combined systolic (congestive) and diastolic (congestive) heart failure: Secondary | ICD-10-CM | POA: Diagnosis not present

## 2024-02-04 DIAGNOSIS — Z6833 Body mass index (BMI) 33.0-33.9, adult: Secondary | ICD-10-CM | POA: Diagnosis not present

## 2024-02-04 DIAGNOSIS — I110 Hypertensive heart disease with heart failure: Secondary | ICD-10-CM | POA: Insufficient documentation

## 2024-02-04 DIAGNOSIS — J449 Chronic obstructive pulmonary disease, unspecified: Secondary | ICD-10-CM | POA: Diagnosis not present

## 2024-02-04 DIAGNOSIS — I5022 Chronic systolic (congestive) heart failure: Secondary | ICD-10-CM | POA: Diagnosis not present

## 2024-02-04 DIAGNOSIS — Z87891 Personal history of nicotine dependence: Secondary | ICD-10-CM | POA: Insufficient documentation

## 2024-02-04 DIAGNOSIS — E669 Obesity, unspecified: Secondary | ICD-10-CM | POA: Diagnosis not present

## 2024-02-04 LAB — BASIC METABOLIC PANEL
Anion gap: 9 (ref 5–15)
BUN: 13 mg/dL (ref 8–23)
CO2: 34 mmol/L — ABNORMAL HIGH (ref 22–32)
Calcium: 9 mg/dL (ref 8.9–10.3)
Chloride: 97 mmol/L — ABNORMAL LOW (ref 98–111)
Creatinine, Ser: 0.99 mg/dL (ref 0.61–1.24)
GFR, Estimated: >60 mL/min (ref >60)
Glucose, Bld: 138 mg/dL — ABNORMAL HIGH (ref 70–99)
Potassium: 3.4 mmol/L — ABNORMAL LOW (ref 3.5–5.1)
Sodium: 140 mmol/L (ref 135–145)

## 2024-02-04 LAB — BRAIN NATRIURETIC PEPTIDE: B Natriuretic Peptide: 286 pg/mL — ABNORMAL HIGH (ref 0.0–100.0)

## 2024-02-04 MED ORDER — POTASSIUM CHLORIDE 20 MEQ PO PACK: PACK | ORAL | 5 refills | Status: DC

## 2024-02-04 NOTE — Telephone Encounter (Signed)
Ok to place order for [employer] supplies?

## 2024-02-04 NOTE — Telephone Encounter (Signed)
Patient states he is disabled and doesn't have anyone to accompany him during 2/17 1:45 PM appointment with Dr. Allyson Sabal. He would like to know if someone can come downstairs to the parking garage to assist him with a wheelchair.

## 2024-02-04 NOTE — Patient Instructions (Signed)
Medication Changes:  No Changes In Medications at this time.   Lab Work:  Labs done today, your results will be available in MyChart, we will contact you for abnormal readings.  Follow-Up in: 2-3 MONTHS WITH DR. Gala Romney PLEASE CALL OUR OFFICE AROUND MARCH  TO GET SCHEDULED FOR YOUR APPOINTMENT. PHONE NUMBER IS [phone] OPTION 2   At the [facility], you and your health needs are our priority. We have a designated team specialized in the treatment of Heart Failure. This Care Team includes your primary Heart Failure Specialized Cardiologist (physician), Advanced Practice Providers (APPs- Physician Assistants and Nurse Practitioners), and Pharmacist who all work together to provide you with the care you need, when you need it.   You may see any of the following providers on your designated Care Team at your next follow up:  Dr. Arvilla Meres Dr. Marca Ancona Dr. Dorthula Nettles Dr. Theresia Bough Tonye Becket, NP Robbie Lis, [place] [facility] [place], [place] Brynda Peon, NP [place] Lee, NP Karle Plumber, PharmD   Please be sure to bring in all your medications bottles to every appointment.   Need to Contact us:  If you have any questions or concerns before your next appointment please send [place] a message through [place] or call our office at [phone].    TO LEAVE A MESSAGE FOR THE NURSE SELECT OPTION 2, PLEASE LEAVE A MESSAGE INCLUDING: YOUR NAME DATE OF BIRTH CALL BACK NUMBER REASON FOR CALL**this is important as we prioritize the call backs  YOU WILL RECEIVE A CALL BACK THE SAME DAY AS LONG AS YOU CALL BEFORE 4:00 PM

## 2024-02-04 NOTE — Telephone Encounter (Signed)
-----   Message from [place] sent at 02/04/2024  4:31 PM EST ----- K is low, increase KCL to 60 mEq qam and 40 meq K qPM.

## 2024-02-04 NOTE — Telephone Encounter (Signed)
Patient advised and verbalized understanding. New Rx sent into patients pharmacy. Patient asked for packet form.   Meds ordered this encounter  Medications   potassium chloride (KLOR-CON) 20 MEQ packet    Sig: Take 60 mEq by mouth every morning AND 40 mEq every evening.    Dispense:  150 packet    Refill:  5    D/C tablets Please cancel all previous orders for current medication. Change in dosage or pill size.

## 2024-02-04 NOTE — Telephone Encounter (Signed)
Spoke to patient he stated he has several fractured ribs and hard for him to walk.He will need a wheelchair once he gets to [address] parking garage.He will call when he arrives on Mon 2/17 to his appointment.Advised I will make Dr.Berry's RN aware you will need someone to bring you a wheelchair.

## 2024-02-04 NOTE — Progress Notes (Signed)
ADVANCED [facility] CLINIC NOTE  HF MD: Dr. Gala Romney  Pulmonology: Dr Delton Coombes   Chief Complaint:  Heart Failure   HPI: Robert Church is a 69 y.o. male with h/o obesity, HTN, COPD with ongoing tobacco use and chronic systolic heart failure.    Cath 03/2017 - normal coronaries with EF 40% and global HK.  He saw Dr. Allyson Sabal on 07/01/2018 because of recurrent chest pain.  Myoview stress test performed 07/21/2018 showed EF 38% inferior scar with septal ischemia.  Echo 06/2018  EF of 30 to 35%.  He was referred for cardiac CT.   Admitted 1/22 for acute on chronic hypoxic respiratory failure secondary to acute COPD and CHF w/ volume overload. He had massive diuresis w/ IV Lasix, diuresed down from admit wt of 265 lb to 210 lb. Echo 12/21, EF 40-45% (unchanged). RV mildly reduced, RVSP 49.    In 4/22, had near syncope, Zio showed intermittent CHB. Repeat echo EF 30-35%  Had St Jude dual chamber PM 04/16/21. Had some PMT and device adjusted by EP .   Underwent R/L cath in 9/22 for persistent SOB. No CAD. EF 35% Mildly elevated filling pressures with normal output.   Subsequently underwent CRT-D upgrade attempt on 10/07/21 by Dr. Elberta Fortis due to high-degree of RV pacing. 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   S/p Barostim 2/23  S/p BiV upgrade 11/11/22. 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   Returns to clinic today for reassessment of volume status. Seen 1/30 and endorsed LEE, DOE and cough, in the setting of dietary indiscretion. Had also been treated for recent CAP. Had completed abx. Wt was up in clinic. He was instructed to take torsemide 60 mg daily and metolazone twice a week.  He is much improved from a volume status. Wt is down 12 lb from 246>>234 lb. LEE nearly resolved (trace edema on exam and held metolzone this morning in anticipation of today's visit). Thoracic impedence improved and back to normal. He continues w/ residual productive cough w/ clear colored sputum and mild  dyspnea but says that he caught RSV shortly after last visit and is recovering from that.     Wt Readings from Last 3 Encounters:  02/04/24 106.3 kg (234 lb 6.4 oz)  01/20/24 111.9 kg (246 lb 12.8 oz)  01/12/24 109.3 kg (241 lb)      Cardiac studies: Echo 06/2018 LVEF 30-35%, RV normal  Echo 12/2018 LVEF 40-45%, RV normal  Echo 11/2020 LVEF 40-45%, RV mildly reduced Echo 03/2021 30-35%, RV mildly reduced  Echo 07/2021 EF 30-35% RV moderately reduced  Echo 01/13/23 EF < 20%, RV mildly down  RHC 3/24 RA = 7 RV = 37/11 PA = 39/20 (28) PCW = 13 Fick cardiac output/index = 5.2/2.3 PVR = 2.9 WU FA sat = 91% PA sat = 63% Review of systems complete and found to be negative unless listed in HPI.    Past Medical History:  Diagnosis Date   Adenomatous colon polyp    Allergy    Anal fissure    Arthritis    Asthma    Bronchitis    CHF (congestive heart failure) (HCC)    COPD, group D, by GOLD 2017 classification (HCC)    Emphysema lung (HCC)    GERD (gastroesophageal reflux disease)    History of hiatal hernia    Hyperlipidemia    Hypertension    NICM (nonischemic cardiomyopathy) (HCC)    OSA (obstructive sleep apnea)  Pneumonia    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   Requires supplemental oxygen     Current Outpatient Medications  Medication Sig Dispense Refill   albuterol (PROVENTIL) (2.5 MG/3ML) 0.083% nebulizer solution TAKE 3 MLS BY NEBULIZATION EVERY 4 HOURS AS NEEDED FOR WHEEZING OR SHORTNESS OF BREATH 150 mL 11   allopurinol (ZYLOPRIM) 300 MG tablet Take 1 tablet (300 mg total) by mouth daily. 90 tablet 0   ALPRAZolam (XANAX) 0.5 MG tablet TAKE 1 TABLET(0.5 MG) BY MOUTH THREE TIMES DAILY 270 tablet 0   DALIRESP 500 MCG TABS tablet TAKE 1 TABLET(500 MCG) BY MOUTH DAILY 30 tablet 11   diclofenac Sodium (VOLTAREN) 1 % GEL Apply 1 Application topically in the morning and at bedtime.     EPINEPHrine 0.3 mg/0.3 mL IJ SOAJ injection Inject 0.3 mg into the  muscle as needed for anaphylaxis. 1 each 0   fluticasone (FLONASE) 50 MCG/ACT nasal spray SHAKE LIQUID AND USE 2 SPRAYS IN EACH NOSTRIL DAILY (Patient taking differently: Place 1 spray into both nostrils daily as needed for allergies.) 16 g 2   gabapentin (NEURONTIN) 300 MG capsule Take 1 capsule (300 mg total) by mouth 3 (three) times daily. 90 capsule 4   guaiFENesin (MUCINEX) 600 MG 12 hr tablet Take 1 tablet (600 mg total) by mouth 2 (two) times daily. (Patient taking differently: Take 600 mg by mouth 2 (two) times daily as needed for cough or to loosen phlegm. Pt taking once daily) 60 tablet 5   ipratropium (ATROVENT) 0.06 % nasal spray Place 2 sprays into both nostrils 4 (four) times daily as needed for rhinitis. 45 mL 3   ipratropium-albuterol (DUONEB) 0.5-2.5 (3) MG/3ML SOLN Take 3 mLs by nebulization every 4 (four) hours as needed. 360 mL 5   loratadine (CLARITIN) 10 MG tablet Take 10 mg by mouth every other day. As needed     methocarbamol (ROBAXIN) 500 MG tablet Take 1 tablet (500 mg total) by mouth every 8 (eight) hours as needed for muscle spasms. 30 tablet 1   metolazone (ZAROXOLYN) 2.5 MG tablet Take 1 tablet (2.5 mg total) by mouth 2 (two) times a week. Take 2.5 mg on Monday/Friday 8 tablet 11   mometasone-formoterol (DULERA) 100-5 MCG/ACT AERO INHALE 2 PUFFS INTO THE LUNGS TWICE DAILY 13 g 5   montelukast (SINGULAIR) 10 MG tablet TAKE 1 TABLET BY MOUTH EVERY DAY AT BEDTIME 30 tablet 5   nicotine (NICODERM CQ - DOSED IN MG/24 HOURS) 14 mg/24hr patch Place 1 patch (14 mg total) onto the skin daily. 28 patch 0   omeprazole (PRILOSEC) 20 MG capsule TAKE 1 CAPSULE(20 MG) BY MOUTH TWICE DAILY BEFORE A MEAL FOR STOMACH ACID OR REFLUX 180 capsule 0   ondansetron (ZOFRAN-ODT) 4 MG disintegrating tablet Take 1 tablet (4 mg total) by mouth every 8 (eight) hours as needed for nausea or vomiting. 20 tablet 0   potassium chloride SA (KLOR-CON M20) 20 MEQ tablet Take 2 tablets (40 mEq total) by mouth  2 (two) times daily. 120 tablet 5   predniSONE (DELTASONE) 10 MG tablet Take 1 tablet (10 mg total) by mouth daily with breakfast. 30 tablet 5   Tiotropium Bromide Monohydrate (SPIRIVA RESPIMAT) 2.5 MCG/ACT AERS Inhale 2 puffs into the lungs daily. 4 g 6   torsemide (DEMADEX) 20 MG tablet Take 2 tablets (40 mg total) by mouth daily. 90 tablet 11   VENTOLIN HFA 108 (90 Base) MCG/ACT inhaler INHALE 2 PUFFS INTO  THE LUNGS EVERY 6 HOURS AS NEEDED FOR WHEEZING 18 g 4   vitamin B-12 (CYANOCOBALAMIN) 1000 MCG tablet Take 1,000 mcg by mouth daily.     vitamin E 180 MG (400 UNITS) capsule Take 400 Units by mouth daily.     Testosterone 20.25 MG/1.25GM (1.62%) GEL Place 1.25 g onto the skin daily. (Patient not taking: Reported on 02/04/2024) 112.5 g 0   No current facility-administered medications for this encounter.   Allergies  Allergen Reactions   Bee Venom Shortness Of Breath and Swelling   Cefdinir Other (See Comments)    Patient states he couldn't really breath    Spironolactone Anaphylaxis   Bactrim [Sulfamethoxazole-Trimethoprim] Other (See Comments)    Mouth burning   Daliresp [Roflumilast] Other (See Comments)    Dizzy, headache, leg pain per patient. 02/25/22. Tolerates in low doses    Jardiance [Empagliflozin] Nausea Only and Other (See Comments)    Lightheadness Dizziness   Penicillins Swelling and Other (See Comments)    Childhood rxn--MD stated he "almost died" Has patient had a PCN reaction causing immediate rash, facial/tongue/throat swelling, SOB or lightheadedness with hypotension:Yes Has patient had a PCN reaction causing severe rash involving mucus membranes or skin necrosis:unsure Has patient had a PCN reaction that required hospitalization:unsure Has patient had a PCN reaction occurring within the last 10 years:No If all of the above answers are "NO", then may proceed with Cephalosporin use.     Clindamycin Other (See Comments)    Tongue swelling   Doxycycline Nausea  And Vomiting    Severe stomach upset per patient   E-Mycin [Erythromycin Base] Swelling   Rosuvastatin Other (See Comments)    Myalgias (intolerance)   Social History   Socioeconomic History   Marital status: Single    Spouse name: Not on file   Number of children: 1   Years of education: 33   Highest education level: 12th grade  Occupational History   Occupation: disabled  Tobacco Use   Smoking status: Former    Current packs/day: 0.00    Average packs/day: 2.0 packs/day for 46.0 years (92.0 ttl pk-yrs)    Types: Cigarettes    Start date: 01/28/1976    Quit date: 01/27/2022    Years since quitting: 2.0    Passive exposure: Past   Smokeless tobacco: Never   Tobacco comments:    Chews on a cigar.  09/16/2023 hfb  Vaping Use   Vaping status: Never Used  Substance and Sexual Activity   Alcohol use: Not Currently    Alcohol/week: 28.0 standard drinks of alcohol    Types: 28 Cans of beer per week   Drug use: No   Sexual activity: Yes    Partners: Female    Birth control/protection: None  Other Topics Concern   Not on file  Social History Narrative   Right handed   Tea sometimes and coffee (1/2 caffeine)   Lives alone-2025   Social Drivers of Health   Financial Resource Strain: Medium Risk (01/12/2024)   Overall Financial Resource Strain (CARDIA)    Difficulty of Paying Living Expenses: Somewhat hard  Food Insecurity: No Food Insecurity (01/12/2024)   Hunger Vital Sign    Worried About Running Out of Food in the Last Year: Never true    Ran Out of Food in the Last Year: Never true  Recent Concern: Food Insecurity - Food Insecurity Present (12/19/2023)   Hunger Vital Sign    Worried About Running Out of Food in the Last Year:  Sometimes true    Ran Out of Food in the Last Year: Sometimes true  Transportation Needs: No Transportation Needs (01/12/2024)   PRAPARE - Administrator, Civil Service (Medical): No    Lack of Transportation (Non-Medical): No  Physical  Activity: Inactive (01/12/2024)   Exercise Vital Sign    Days of Exercise per Week: 0 days    Minutes of Exercise per Session: 0 min  Stress: No Stress Concern Present (01/12/2024)   [employer] of Occupational Health - Occupational Stress Questionnaire    Feeling of Stress : Not at all  Recent Concern: Stress - Stress Concern Present (11/04/2023)   [employer] of Occupational Health - Occupational Stress Questionnaire    Feeling of Stress : Rather much  Social Connections: Socially Isolated (01/12/2024)   Social Connection and Isolation Panel [NHANES]    Frequency of Communication with Friends and Family: More than three times a week    Frequency of Social Gatherings with Friends and Family: Three times a week    Attends Religious Services: Never    Active Member of Clubs or Organizations: No    Attends Banker Meetings: Never    Marital Status: Divorced  Catering manager Violence: Not At Risk (06/02/2023)   Humiliation, Afraid, Rape, and Kick questionnaire    Fear of Current or Ex-Partner: No    Emotionally Abused: No    Physically Abused: No    Sexually Abused: No   Family History  Problem Relation Age of Onset   Lung cancer Father    High blood pressure Mother    Diabetes Maternal Grandmother    Colon polyps Neg Hx    Esophageal cancer Neg Hx    Pancreatic cancer Neg Hx    Stomach cancer Neg Hx    Wt Readings from Last 3 Encounters:  02/04/24 106.3 kg (234 lb 6.4 oz)  01/20/24 111.9 kg (246 lb 12.8 oz)  01/12/24 109.3 kg (241 lb)   BP 132/88   Pulse 94   Wt 106.3 kg (234 lb 6.4 oz)   SpO2 93%   BMI 33.63 kg/m  Wt Readings from Last 3 Encounters:  02/04/24 106.3 kg (234 lb 6.4 oz)  01/20/24 111.9 kg (246 lb 12.8 oz)  01/12/24 109.3 kg (241 lb)   PHYSICAL EXAM: General:  fatigued appearing. No respiratory difficulty HEENT: normal Neck: supple. no JVD. Carotids 2+ bilat; no bruits. No lymphadenopathy or thyromegaly appreciated. Cor: PMI  nondisplaced. Regular rate & rhythm. No rubs, gallops or murmurs. Lungs: clear Abdomen: soft, nontender, nondistended. No hepatosplenomegaly. No bruits or masses. Good bowel sounds. Extremities: no cyanosis, clubbing, rash, trace b/l LEE edema Neuro: alert & oriented x 3, cranial nerves grossly intact. moves all 4 extremities w/o difficulty. Affect pleasant.    ASSESSMENT & PLAN: 1. Chronic systolic HF due to NICM  - Suspect NICM due to HTN and inadequately treated OSA (He now reports improved nightly compliance w/ CPAP). - Echo 1/22 EF 40-45%. RV mildly reduced. RVSP 49 - Echo 07/31/21 EF 30-35 % and RV moderately reduced.  - R/LHC 9/22 No CAD EF 35%, Mildly elevated filling pressures with normal output -BiV upgrade 11/11/22. 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  - s/p Barostim. - Echo 1/24 EF < 20% - RHC 3/24  RA 7 PA 39/20 (28) PCW 13 Fick 5.2/2.3 - NYHA Class II, confounded by COPD + recent viral illness recovering from RSV. Volume status much improved. Wt down 12 lb. LEE nearly completely  resolved. Thoracic impedence improved and back to normal  - Continue torsemide 60 mg daily and metolazone twice a week. Mondays + Fridays  - Check f/u BMP and BNP today  - GDMT has been limited due to multiple side effects  - Intolerant losartan due to dizziness  - No spiro (H/o Anaphylaxis).  - Intolerant to Jardiance (positional dizziness and nausea).  - Intolerant bisoprolol due to fatigue, coreg due to dizziness, and toprol due to dry mouth.    2.  HTN - controlled on current regimen. Will continue - check BMP today    3. OSA - compliant w/ CPAP    F/u in clinic in 2-3 months, or sooner if needed.   Robbie Lis, PA-C   2:11 PM

## 2024-02-07 ENCOUNTER — Encounter: Payer: Self-pay | Admitting: Cardiovascular Disease

## 2024-02-07 ENCOUNTER — Ambulatory Visit: Payer: Medicare Other | Attending: Cardiovascular Disease | Admitting: Cardiovascular Disease

## 2024-02-07 VITALS — BP 125/76 | HR 90 | Ht 70.0 in | Wt 231.0 lb

## 2024-02-07 DIAGNOSIS — I10 Essential (primary) hypertension: Secondary | ICD-10-CM | POA: Diagnosis not present

## 2024-02-07 DIAGNOSIS — I428 Other cardiomyopathies: Secondary | ICD-10-CM | POA: Diagnosis not present

## 2024-02-07 DIAGNOSIS — G4733 Obstructive sleep apnea (adult) (pediatric): Secondary | ICD-10-CM

## 2024-02-07 NOTE — Progress Notes (Signed)
02/07/2024 Robert Church   05/20/1956  [id number]  Primary Physician [name], MD Primary Cardiologist: Runell Gess MD Milagros Loll, [place], [place]  HPI:  Robert Church is a 68 y.o.   severely overweight divorced Caucasian male father of 1 grandchild who works in [employer].  He was referred because of chest pain by Dr. Nathanial Rancher for cardiovascular evaluation.  I last saw him in the office 12/02/2021 mother is at [facility], one of my long-term patients as well.  He saw Dr. Eden Emms in the past.  His risk factors include ongoing tobacco abuse, hypertension and family history.  He never had a heart attack or stroke.  He did have a heart catheterization performed by Dr. Katrinka Blazing on 04/19/2017 that showed normal coronary arteries with an EF of 40% consistent with a nonischemic cardia myopathy.    A 2D echo performed 07/07/2018 showed an EF of 30 to 35%.   I referred him to Dr. Gala Romney in the advanced heart failure clinic who changed his carvedilol to Zebeta and his losartan to [facility].  Unfortunately, he did not tolerate the Entresto and he was changed back to losartan.  He does have obstructive sleep apnea on CPAP which he wears intermittently.  He continues to exhibit class II/III symptoms.   He did well until 01/18/2020 when he was admitted with volume overload.  His 2D echo was unchanged with an EF of 40 to 45%.  He was diving 65 pounds from approximately 275 down to 210 and discharged home on torsemide and spironolactone.  He is aware of salt restriction.   He weighs himself on a daily basis.   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 Elberta Fortis 04/16/2021 with improvement in his symptoms.  He is followed by Dr. Gala Romney in the advanced heart failure clinic and is on furosemide 60 mg a day.  He does weigh himself.  He is aware of salt restriction.   He currently is not on a beta-blocker.  I last saw him in the office approximately 2 years ago.  He has been followed by Drs. Bensimhon and Camnitz.  He had a BiV ICD placed as well as a Baro stim device.  He is intolerant to multiple medications limiting GDMT.  He is on torsemide twice a day.   No outpatient medications have been marked as taking for the 02/07/24 encounter (Office Visit) with Runell Gess, MD.     Allergies  Allergen Reactions   Bee Venom Shortness Of Breath and Swelling   Cefdinir Other (See Comments)    Patient states he couldn't really breath    Spironolactone Anaphylaxis   Bactrim [Sulfamethoxazole-Trimethoprim] Other (See Comments)    Mouth burning   Daliresp [Roflumilast] Other (See Comments)    Dizzy, headache, leg pain per patient. 02/25/22. Tolerates in low doses    Jardiance [Empagliflozin] Nausea Only and Other (See Comments)    Lightheadness Dizziness   Penicillins Swelling and Other (See Comments)    Childhood rxn--MD stated he "almost died" Has patient had a PCN reaction causing immediate rash, facial/tongue/throat swelling, SOB or lightheadedness with hypotension:Yes Has patient had a PCN reaction causing severe rash involving mucus membranes or skin necrosis:unsure Has patient had a PCN reaction that required hospitalization:unsure Has patient had a PCN reaction occurring within the last 10 years:No If all of the above answers  are "NO", then may proceed with Cephalosporin use.     Clindamycin Other (See Comments)    Tongue swelling   Doxycycline Nausea And Vomiting    Severe stomach upset per patient   E-Mycin [Erythromycin Base] Swelling   Rosuvastatin Other (See Comments)    Myalgias (intolerance)    Social History   Socioeconomic History   Marital status: Single    Spouse name: Not on file   Number of children: 1   Years of education: 52   Highest education level: 12th grade  Occupational History   Occupation: disabled  Tobacco  Use   Smoking status: Former    Current packs/day: 0.00    Average packs/day: 2.0 packs/day for 46.0 years (92.0 ttl pk-yrs)    Types: Cigarettes    Start date: 01/28/1976    Quit date: 01/27/2022    Years since quitting: 2.0    Passive exposure: Past   Smokeless tobacco: Never   Tobacco comments:    Chews on a cigar.  09/16/2023 hfb  Vaping Use   Vaping status: Never Used  Substance and Sexual Activity   Alcohol use: Not Currently    Alcohol/week: 28.0 standard drinks of alcohol    Types: 28 Cans of beer per week   Drug use: No   Sexual activity: Yes    Partners: Female    Birth control/protection: None  Other Topics Concern   Not on file  Social History Narrative   Right handed   Tea sometimes and coffee (1/2 caffeine)   Lives alone-2025   Social Drivers of Health   Financial Resource Strain: Medium Risk (01/12/2024)   Overall Financial Resource Strain (CARDIA)    Difficulty of Paying Living Expenses: Somewhat hard  Food Insecurity: No Food Insecurity (01/12/2024)   Hunger Vital Sign    Worried About Running Out of Food in the Last Year: Never true    Ran Out of Food in the Last Year: Never true  Recent Concern: Food Insecurity - Food Insecurity Present (12/19/2023)   Hunger Vital Sign    Worried About Running Out of Food in the Last Year: Sometimes true    Ran Out of Food in the Last Year: Sometimes true  Transportation Needs: No Transportation Needs (01/12/2024)   PRAPARE - Administrator, Civil Service (Medical): No    Lack of Transportation (Non-Medical): No  Physical Activity: Inactive (01/12/2024)   Exercise Vital Sign    Days of Exercise per Week: 0 days    Minutes of Exercise per Session: 0 min  Stress: No Stress Concern Present (01/12/2024)   [employer] of Occupational Health - Occupational Stress Questionnaire    Feeling of Stress : Not at all  Recent Concern: Stress - Stress Concern Present (11/04/2023)   [employer] of Occupational  Health - Occupational Stress Questionnaire    Feeling of Stress : Rather much  Social Connections: Socially Isolated (01/12/2024)   Social Connection and Isolation Panel [NHANES]    Frequency of Communication with Friends and Family: More than three times a week    Frequency of Social Gatherings with Friends and Family: Three times a week    Attends Religious Services: Never    Active Member of Clubs or Organizations: No    Attends Banker Meetings: Never    Marital Status: Divorced  Catering manager Violence: Not At Risk (06/02/2023)   Humiliation, Afraid, Rape, and Kick questionnaire    Fear of Current or Ex-Partner: No  Emotionally Abused: No    Physically Abused: No    Sexually Abused: No     Review of Systems: General: negative for chills, fever, night sweats or weight changes.  Cardiovascular: negative for chest pain, dyspnea on exertion, edema, orthopnea, palpitations, paroxysmal nocturnal dyspnea or shortness of breath Dermatological: negative for rash Respiratory: negative for cough or wheezing Urologic: negative for hematuria Abdominal: negative for nausea, vomiting, diarrhea, bright red blood per rectum, melena, or hematemesis Neurologic: negative for visual changes, syncope, or dizziness All other systems reviewed and are otherwise negative except as noted above.    Blood pressure 125/76, pulse 90, height 5\' 10"  (1.778 m), weight 231 lb (104.8 kg), SpO2 90%.  General appearance: alert and no distress Neck: no adenopathy, no carotid bruit, no JVD, supple, symmetrical, trachea midline, and thyroid not enlarged, symmetric, no tenderness/mass/nodules Lungs: clear to auscultation bilaterally Heart: regular rate and rhythm, S1, S2 normal, no murmur, click, rub or gallop Extremities: 1+ lower extremity edema Pulses: 2+ and symmetric Skin: Skin color, texture, turgor normal. No rashes or lesions Neurologic: Grossly normal  EKG EKG  Interpretation Date/Time:  Monday February 07 2024 14:16:51 EST Ventricular Rate:  90 PR Interval:  210 QRS Duration:  116 QT Interval:  386 QTC Calculation: 472 R Axis:   -40  Text Interpretation: Atrial-sensed ventricular-paced rhythm with prolonged AV conduction When compared with ECG of 08-Jul-2023 13:50, Vent. rate has increased BY   4 BPM Confirmed by Nanetta Batty [phone]) on 02/07/2024 2:49:36 PM    ASSESSMENT AND PLAN:   HTN (hypertension) History of essential hypertension her blood pressure measured today at 125/76.  He is not on antihypertensive medications.  Obstructive sleep apnea syndrome History of obstructive sleep apnea on BiPAP  NICM (nonischemic cardiomyopathy) (HCC) History of nonischemic cardiomyopathy with an EF by his most recent echo of 30 to 35% performed 06/02/2023.  He has been evaluated in the advanced heart failure clinic by Dr. Gala Romney.  He is intolerant to multiple medications including beta-blockers and ARB's as well as spironolactone.  He has had a BiV ICD placed by Dr. Elberta Fortis and a Baro  stim device.  He is still extremely functional limited.     Runell Gess MD FACP,FACC,FAHA, [facility] 02/07/2024 3:00 PM

## 2024-02-07 NOTE — Patient Instructions (Signed)
 Medication Instructions:  Your physician recommends that you continue on your current medications as directed. Please refer to the Current Medication list given to you today.  *If you need a refill on your cardiac medications before your next appointment, please call your pharmacy*   Follow-Up: At [facility], you and your health needs are our priority.  As part of our continuing mission to provide you with exceptional heart care, we have created designated Provider Care Teams.  These Care Teams include your primary Cardiologist (physician) and Advanced Practice Providers (APPs -  Physician Assistants and Nurse Practitioners) who all work together to provide you with the care you need, when you need it.  We recommend signing up for the patient portal called "MyChart".  Sign up information is provided on this After Visit Summary.  MyChart is used to connect with patients for Virtual Visits (Telemedicine).  Patients are able to view lab/test results, encounter notes, upcoming appointments, etc.  Non-urgent messages can be sent to your provider as well.   To learn more about what you can do with MyChart, go to [email or web address].    Your next appointment:   12 month(s)  Provider:   Nanetta Batty, MD     Other Instructions

## 2024-02-07 NOTE — Telephone Encounter (Signed)
Order has been placed and I left a message on the patient's secure voicemail notifying him.  Nothing further needed.

## 2024-02-07 NOTE — Assessment & Plan Note (Signed)
History of essential hypertension her blood pressure measured today at 125/76.  He is not on antihypertensive medications.

## 2024-02-07 NOTE — Assessment & Plan Note (Signed)
History of obstructive sleep apnea on BiPAP 

## 2024-02-07 NOTE — Telephone Encounter (Signed)
Yes please place order for what ever supplies he needs.  Thank you

## 2024-02-07 NOTE — Assessment & Plan Note (Signed)
History of nonischemic cardiomyopathy with an EF by his most recent echo of 30 to 35% performed 06/02/2023.  He has been evaluated in the advanced heart failure clinic by Dr. Gala Romney.  He is intolerant to multiple medications including beta-blockers and ARB's as well as spironolactone.  He has had a BiV ICD placed by Dr. Elberta Fortis and a Baro  stim device.  He is still extremely functional limited.

## 2024-02-08 ENCOUNTER — Encounter: Payer: Self-pay | Admitting: Family Medicine

## 2024-02-08 ENCOUNTER — Ambulatory Visit (INDEPENDENT_AMBULATORY_CARE_PROVIDER_SITE_OTHER): Payer: Medicare Other | Admitting: Family Medicine

## 2024-02-08 VITALS — BP 120/78 | HR 84 | Temp 97.6°F | Ht 70.0 in | Wt 229.8 lb

## 2024-02-08 DIAGNOSIS — R053 Chronic cough: Secondary | ICD-10-CM

## 2024-02-08 DIAGNOSIS — M7021 Olecranon bursitis, right elbow: Secondary | ICD-10-CM

## 2024-02-08 DIAGNOSIS — R2681 Unsteadiness on feet: Secondary | ICD-10-CM

## 2024-02-08 DIAGNOSIS — E119 Type 2 diabetes mellitus without complications: Secondary | ICD-10-CM

## 2024-02-08 DIAGNOSIS — M25521 Pain in right elbow: Secondary | ICD-10-CM

## 2024-02-08 DIAGNOSIS — J441 Chronic obstructive pulmonary disease with (acute) exacerbation: Secondary | ICD-10-CM | POA: Diagnosis not present

## 2024-02-08 DIAGNOSIS — M79604 Pain in right leg: Secondary | ICD-10-CM

## 2024-02-08 DIAGNOSIS — E1159 Type 2 diabetes mellitus with other circulatory complications: Secondary | ICD-10-CM

## 2024-02-08 DIAGNOSIS — J431 Panlobular emphysema: Secondary | ICD-10-CM

## 2024-02-08 DIAGNOSIS — E1169 Type 2 diabetes mellitus with other specified complication: Secondary | ICD-10-CM

## 2024-02-08 DIAGNOSIS — M79605 Pain in left leg: Secondary | ICD-10-CM

## 2024-02-08 DIAGNOSIS — K7030 Alcoholic cirrhosis of liver without ascites: Secondary | ICD-10-CM

## 2024-02-08 DIAGNOSIS — J9611 Chronic respiratory failure with hypoxia: Secondary | ICD-10-CM

## 2024-02-08 DIAGNOSIS — M47816 Spondylosis without myelopathy or radiculopathy, lumbar region: Secondary | ICD-10-CM

## 2024-02-08 DIAGNOSIS — R1902 Left upper quadrant abdominal swelling, mass and lump: Secondary | ICD-10-CM

## 2024-02-08 DIAGNOSIS — G8929 Other chronic pain: Secondary | ICD-10-CM

## 2024-02-08 DIAGNOSIS — E669 Obesity, unspecified: Secondary | ICD-10-CM

## 2024-02-08 DIAGNOSIS — I152 Hypertension secondary to endocrine disorders: Secondary | ICD-10-CM

## 2024-02-08 DIAGNOSIS — S2232XD Fracture of one rib, left side, subsequent encounter for fracture with routine healing: Secondary | ICD-10-CM

## 2024-02-08 DIAGNOSIS — M6281 Muscle weakness (generalized): Secondary | ICD-10-CM

## 2024-02-08 NOTE — Progress Notes (Unsigned)
   Acute Office Visit  Subjective:     Patient ID: Robert Church, male    DOB: 11-21-1956, 68 y.o.   MRN: [id number]  No chief complaint on file.   HPI Patient is in today for follow up of pneumonia, back pain, broken left rib. Also reports continued muscle pain to the right thoracic back.  Was treated with methocarbamol by me on 02/02/24. Reports that this was not helpful. Inquiring about motorized scooter to help with ambulation for decreased muscle strength, SOB. Bilateral leg pain, having trouble walking.     ROS Per HPI      Objective:    There were no vitals taken for this visit.   Physical Exam Vitals and nursing note reviewed.  Constitutional:      General: He is not in acute distress.    Appearance: He is obese.     Comments: Appears fatigued  HENT:     Head: Normocephalic and atraumatic.     Nose: Nose normal. No congestion.     Mouth/Throat:     Mouth: Mucous membranes are moist.     Pharynx: Oropharynx is clear. No oropharyngeal exudate or posterior oropharyngeal erythema.  Eyes:     Extraocular Movements: Extraocular movements intact.  Cardiovascular:     Rate and Rhythm: Normal rate and regular rhythm.  Pulmonary:     Effort: Pulmonary effort is normal. No respiratory distress.     Breath sounds: Wheezing and rhonchi present. No rales.  Abdominal:     Palpations: There is mass (LUQ).     Comments: L abd and L back mildly swollen, tender. No erythema, no heat  Musculoskeletal:        General: Swelling and tenderness present.     Cervical back: Normal range of motion.     Comments: R elbow erythema, swelling, tenderness. No lesions, no discharge, no bleeding noted  Lymphadenopathy:     Cervical: Cervical adenopathy present.  Skin:    Capillary Refill: Capillary refill takes less than 2 seconds.  Neurological:     Mental Status: He is alert.   No results found for any visits on 02/08/24.      Assessment & Plan:  ***  No orders of the  defined types were placed in this encounter.   No follow-ups on file.  Moshe Cipro, FNP

## 2024-02-09 ENCOUNTER — Encounter: Payer: Self-pay | Admitting: Adult Health

## 2024-02-09 ENCOUNTER — Encounter: Payer: Self-pay | Admitting: Family Medicine

## 2024-02-09 ENCOUNTER — Telehealth: Payer: Self-pay | Admitting: Emergency Medicine

## 2024-02-09 DIAGNOSIS — R2681 Unsteadiness on feet: Secondary | ICD-10-CM | POA: Insufficient documentation

## 2024-02-09 DIAGNOSIS — G8929 Other chronic pain: Secondary | ICD-10-CM | POA: Insufficient documentation

## 2024-02-09 DIAGNOSIS — J441 Chronic obstructive pulmonary disease with (acute) exacerbation: Secondary | ICD-10-CM | POA: Diagnosis not present

## 2024-02-09 DIAGNOSIS — R1902 Left upper quadrant abdominal swelling, mass and lump: Secondary | ICD-10-CM | POA: Insufficient documentation

## 2024-02-09 DIAGNOSIS — M7021 Olecranon bursitis, right elbow: Secondary | ICD-10-CM | POA: Insufficient documentation

## 2024-02-09 DIAGNOSIS — M6281 Muscle weakness (generalized): Secondary | ICD-10-CM | POA: Insufficient documentation

## 2024-02-09 DIAGNOSIS — J449 Chronic obstructive pulmonary disease, unspecified: Secondary | ICD-10-CM

## 2024-02-09 NOTE — Telephone Encounter (Signed)
Robert Church; Robert Church; Robert Church; Robert Church We have to have 'administration kit and filter' loaded on the prescription in addition to the nebulizer itself since those are billable items. A different option is to load the specific HCPCs for the kit and filter

## 2024-02-10 ENCOUNTER — Telehealth: Payer: Self-pay | Admitting: Radiology

## 2024-02-10 ENCOUNTER — Ambulatory Visit (HOSPITAL_COMMUNITY)
Admission: RE | Admit: 2024-02-10 | Discharge: 2024-02-10 | Disposition: A | Discharge status: Home / Self Care | Payer: Medicare Other | Source: Ambulatory Visit | Attending: Family Medicine | Admitting: Family Medicine

## 2024-02-10 ENCOUNTER — Encounter (HOSPITAL_COMMUNITY): Payer: Self-pay

## 2024-02-10 DIAGNOSIS — J9611 Chronic respiratory failure with hypoxia: Secondary | ICD-10-CM

## 2024-02-10 DIAGNOSIS — S2232XD Fracture of one rib, left side, subsequent encounter for fracture with routine healing: Secondary | ICD-10-CM

## 2024-02-10 DIAGNOSIS — R1902 Left upper quadrant abdominal swelling, mass and lump: Secondary | ICD-10-CM

## 2024-02-10 DIAGNOSIS — J441 Chronic obstructive pulmonary disease with (acute) exacerbation: Secondary | ICD-10-CM

## 2024-02-10 DIAGNOSIS — R053 Chronic cough: Secondary | ICD-10-CM

## 2024-02-10 DIAGNOSIS — J431 Panlobular emphysema: Secondary | ICD-10-CM

## 2024-02-10 LAB — SYNOVIAL FLUID PANEL
Eos, Fluid: 0 %
Glucose, Fluid: 76 mg/dL
Lining Cells, Synovial: 0 %
Lymphs, Fluid: 35 %
Macrophages Fld: 6 %
Nuc cell # Fld: 1337 {cells}/uL — ABNORMAL HIGH (ref 0–200)
Polys, Fluid: 59 %
Protein, Fluid: 3.3 g/dL
RBC, Fluid: 172000 /uL

## 2024-02-10 NOTE — Telephone Encounter (Signed)
Copied from CRM [id number]. Topic: General - Other >> Feb 10, 2024  3:39 PM Jon Gills C wrote:  Reason for CRM: Imaging called regarding CT orders for Robert Church, informed them that she is not in today and she will order them tomorrow

## 2024-02-10 NOTE — Telephone Encounter (Signed)
Copied from CRM [id number]. Topic: Clinical - Request for Lab/Test Order >> Feb 10, 2024 10:27 AM Isabell A wrote: Reason for CRM: Patient states he was suppose to get a CT completed at [facility], they're closing early today and not sure if they'll be open tomorrow. Patient is requesting to go somewhere else, possibly today in the afternoon - he is not sure where else he can, would like to be referred somewhere.

## 2024-02-10 NOTE — Addendum Note (Signed)
Addended by: Katharine Look on: 02/10/2024 11:15 AM   Modules accepted: Orders

## 2024-02-10 NOTE — Telephone Encounter (Signed)
Sent patient a [employer] geologist, provider will return tomorrow and we will get this resigned if needed

## 2024-02-11 ENCOUNTER — Encounter: Payer: Self-pay | Admitting: Family Medicine

## 2024-02-11 ENCOUNTER — Ambulatory Visit: Payer: Self-pay | Admitting: Internal Medicine

## 2024-02-11 ENCOUNTER — Ambulatory Visit (HOSPITAL_COMMUNITY)
Admission: RE | Admit: 2024-02-11 | Discharge: 2024-02-11 | Disposition: A | Discharge status: Home / Self Care | Payer: Medicare Other | Source: Ambulatory Visit | Attending: Family Medicine | Admitting: Family Medicine

## 2024-02-11 ENCOUNTER — Telehealth: Payer: Self-pay | Admitting: Internal Medicine

## 2024-02-11 DIAGNOSIS — J441 Chronic obstructive pulmonary disease with (acute) exacerbation: Secondary | ICD-10-CM | POA: Insufficient documentation

## 2024-02-11 DIAGNOSIS — R1902 Left upper quadrant abdominal swelling, mass and lump: Secondary | ICD-10-CM | POA: Diagnosis not present

## 2024-02-11 DIAGNOSIS — R053 Chronic cough: Secondary | ICD-10-CM | POA: Diagnosis not present

## 2024-02-11 DIAGNOSIS — S2232XD Fracture of one rib, left side, subsequent encounter for fracture with routine healing: Secondary | ICD-10-CM | POA: Diagnosis not present

## 2024-02-11 DIAGNOSIS — S301XXA Contusion of abdominal wall, initial encounter: Secondary | ICD-10-CM | POA: Diagnosis not present

## 2024-02-11 DIAGNOSIS — J9811 Atelectasis: Secondary | ICD-10-CM | POA: Diagnosis not present

## 2024-02-11 DIAGNOSIS — J431 Panlobular emphysema: Secondary | ICD-10-CM | POA: Insufficient documentation

## 2024-02-11 DIAGNOSIS — J439 Emphysema, unspecified: Secondary | ICD-10-CM | POA: Diagnosis not present

## 2024-02-11 DIAGNOSIS — J9611 Chronic respiratory failure with hypoxia: Secondary | ICD-10-CM | POA: Insufficient documentation

## 2024-02-11 NOTE — Telephone Encounter (Signed)
Copied from CRM [id number]. Topic: Clinical - Pink Word Triage >> Feb 11, 2024  5:06 PM Sonny Dandy B wrote: Reason for Triage: [facility] radiology called with urgent ct results.

## 2024-02-11 NOTE — Telephone Encounter (Addendum)
Nothing needs to be done

## 2024-02-11 NOTE — Telephone Encounter (Signed)
 New order placed

## 2024-02-11 NOTE — Progress Notes (Signed)
Intercostal lung hernia. Spoke with patient, discussed results and will forward results to Dr. Delton Coombes. Discussed when to go to the ER for more acute care. Will have him follow up with pulmonology.

## 2024-02-11 NOTE — Telephone Encounter (Signed)
Called pt to see how he is doing. Reports pain when coughing but otherwise breathing is near baseline. Told him if things worsen, he would have to go to ER for further evaluation. He voiced understanding and agreement. Will route to PCP and FNP who evaluated him most recently.

## 2024-02-11 NOTE — Telephone Encounter (Signed)
Bjorn Loser, Radiology Assistant at [facility] Radiology called to give stat CT Abdomen result. It was read back and verified in EPIC. Spoke to Dr. Carmelia Roller, on call provider who asked me to route this to him and he will take a look later on. He asked me to read the report to him which was done.

## 2024-02-13 NOTE — Telephone Encounter (Signed)
 Patient had recent CT performed and is requesting you to look at it to determine what you think might need to be done. Dr. Delton Coombes, please advise

## 2024-02-14 ENCOUNTER — Telehealth: Payer: Self-pay | Admitting: Emergency Medicine

## 2024-02-14 NOTE — Telephone Encounter (Signed)
 Discussed with patient on phone when giving CT results

## 2024-02-14 NOTE — Telephone Encounter (Signed)
 Dr. Delton Coombes,   Please see where Moshe Cipro, FNP forwarded this patients CT chest from 02/11/2024 to you.  Patient is requesting your follow up advise.  Thank you.

## 2024-02-14 NOTE — Telephone Encounter (Signed)
 Patient states needs appointment this week. No available appointments with Dr, Delton Coombes or NP's. Patient phone number is [phone].

## 2024-02-14 NOTE — Telephone Encounter (Signed)
 Hebert Soho; [place], [place]; [place], [place]; Darcel Smalling I am not seeing a narrative or provider mentioning the need and benefit from nebulizer. Could we have provider update note that was entered within the last 90days,please.

## 2024-02-14 NOTE — Telephone Encounter (Signed)
 Patient checking on CT scan to be read by Dr. Delton Coombes. States has fluid on lungs. Patient phone number is [phone].

## 2024-02-15 ENCOUNTER — Ambulatory Visit: Payer: Medicare Other | Admitting: Primary Care

## 2024-02-15 ENCOUNTER — Ambulatory Visit (INDEPENDENT_AMBULATORY_CARE_PROVIDER_SITE_OTHER): Payer: Medicare Other

## 2024-02-15 ENCOUNTER — Encounter: Payer: Self-pay | Admitting: Primary Care

## 2024-02-15 VITALS — BP 124/66 | HR 87 | Temp 98.7°F | Ht 70.0 in | Wt 228.7 lb

## 2024-02-15 DIAGNOSIS — I428 Other cardiomyopathies: Secondary | ICD-10-CM

## 2024-02-15 DIAGNOSIS — I509 Heart failure, unspecified: Secondary | ICD-10-CM

## 2024-02-15 DIAGNOSIS — J90 Pleural effusion, not elsewhere classified: Secondary | ICD-10-CM

## 2024-02-15 DIAGNOSIS — J449 Chronic obstructive pulmonary disease, unspecified: Secondary | ICD-10-CM

## 2024-02-15 DIAGNOSIS — J984 Other disorders of lung: Secondary | ICD-10-CM | POA: Diagnosis not present

## 2024-02-15 DIAGNOSIS — Z87891 Personal history of nicotine dependence: Secondary | ICD-10-CM

## 2024-02-15 MED ORDER — IPRATROPIUM-ALBUTEROL 0.5-2.5 (3) MG/3ML IN SOLN: 3.0000 mL | RESPIRATORY_TRACT | 5 refills | Status: DC | PRN

## 2024-02-15 MED ORDER — DEXTROMETHORPHAN HBR 15 MG/5ML PO SYRP: 10.0000 mL | ORAL_SOLUTION | Freq: Four times a day (QID) | ORAL | 0 refills | Status: DC | PRN

## 2024-02-15 MED ORDER — PREDNISONE 10 MG PO TABS: 10.0000 mg | ORAL_TABLET | Freq: Every day | ORAL | 1 refills | Status: DC

## 2024-02-15 MED ORDER — AZELASTINE HCL 0.1 % NA SOLN: 2.0000 | Freq: Two times a day (BID) | NASAL | 1 refills | Status: AC

## 2024-02-15 NOTE — Telephone Encounter (Signed)
 He was seen by T. Parrett in last 90 days - I will route to her to see if possible to addend her note to reflect that he uses and gets benefit from nebulized BD

## 2024-02-15 NOTE — Telephone Encounter (Signed)
 Buelah Manis NP office visit 02/15/2024 address this .

## 2024-02-15 NOTE — Telephone Encounter (Signed)
 Would be ok to put him as an acute OV on any provider's schedule if anyone available.

## 2024-02-15 NOTE — Patient Instructions (Addendum)
-  COPD/ASTHMA: Chronic obstructive pulmonary disease (COPD) and asthma are conditions that cause breathing difficulties. Your chronic cough with clear mucus is likely related to these conditions. We will increase your Prednisone to 20mg  daily for 7 days, then return to 10mg  daily. We will also refill your Prednisone prescription. Take Delsym twice daily  for cough suppression.  -PLEURAL EFFUSION: A pleural effusion is a buildup of fluid between the tissues that line the lungs and the chest. Your recent CT scan showed a small left-sided pleural effusion, likely due to your recent pneumonia/RSV infection. We plan to repeat the CT scan in 4-6 weeks and may consult with cardiothoracic surgery regarding your rib fracture and lung herniation.  -SLEEP APNEA: Sleep apnea is a condition where breathing repeatedly stops and starts during sleep. You are using a non-invasive ventilator device at night and during the day when at home. Continue with your current management.  -HEART FAILURE: Heart failure is a condition where the heart doesn't pump blood as well as it should. You are managing this with Torsemide 40mg  daily and have noted recent weight loss. Continue with your current management.  -GOUT: Gout is a form of arthritis characterized by severe pain, redness, and tenderness in joints. You are on Allopurinol for elevated uric acid levels. Continue with your current management.  -POSTNASAL DRIP: Postnasal drip occurs when excess mucus from the nose drips down the back of the throat, often causing a cough. You experience this primarily at night. We recommend resuming Astelin nasal spray and will refill your prescription if needed.  INSTRUCTIONS:  Please follow up in 4-6 weeks for a repeat CT scan to monitor your pleural effusion. Consider a consultation with cardiothoracic surgery regarding your rib fracture and lung herniation if symptoms persist.  Follow-up 6-8 weeks with Dr. Delton Coombes

## 2024-02-15 NOTE — Telephone Encounter (Signed)
 Spoke to patient and scheduled appt for today at 3:00 with Beth.  Nothing further needed.

## 2024-02-15 NOTE — Progress Notes (Signed)
 @Patient  ID: Robert Church, male    DOB: 11-Jun-1956, 68 y.o.   MRN: [id number]  No chief complaint on file.   Referring provider: [name], MD  HPI: 68 year old male, smoker quit in February 2023 (92-pack-year history).  Past medical history significant for coronary artery calcification, chronic combined systolic and diastolic heart failure, hypertension, coronary artery calcification, chronic combined systolic and diastolic heart failure, hypertension, nonischemic cardiomyopathy, COPD, intrinsic asthma, spastic exposure, chronic respiratory failure with hypoxia, obstructive sleep apnea, emphysema, seasonal allergic rhinitis, GERD, liver cirrhosis, diabetes mellitus, hyperlipidemia, gait instability, generalized muscle weakness.  02/15/2024 Discussed the use of AI scribe software for clinical note transcription with the patient, who gave verbal consent to proceed.  History of Present Illness   Robert Church is a 68 year old male with COPD, asthma, and heart failure who presents for follow-up after a CT scan due to abdominal pain and swelling. CT chest wo contrast on 02/09/23 showed intercostal lung hernia at the left posterolateral eight intercostal space, small left pleural effusion. Six weeks ago he developed URI symptoms which led to a coughing spell, cracked rib, and subsequent RSV and pneumonia. Treatment included doxycycline and prednisone. He continues to experience a cough, primarily at night and sometimes while sitting, producing clear mucus. The cough has improved significantly since the initial illness.  He has a history of COPD and asthma, managed with Dulera, Spiriva, Daliresp, and prednisone 10 mg daily. He also uses albuterol as needed. He uses a noninvasive ventilator device managed by [facility] every night and during the day when at home, with oxygen blended into the device. He experiences shortness of breath in the mornings, which improves after using the ventilator for a  few minutes. He does not use oxygen during the day unless his oxygen levels drop, as he did during a previous pneumonia episode.  He experiences a persistent cough with clear mucus, primarily at night, likely due to postnasal drip. He has previously used ipratropium nasal spray. He uses Ricola lozenges to manage his cough.  He has a history of sleep apnea and uses a ventilator device at night. He experiences postnasal drip, which exacerbates his cough at night.  He has a history of heart failure and is on torsemide 40 mg daily. He reports a weight fluctuation, having gained weight during his illness and subsequently losing it. He notes that his ankles are less swollen than before. He is also on allopurinol for elevated uric acid levels.        Allergies  Allergen Reactions   Bee Venom Shortness Of Breath and Swelling   Cefdinir Other (See Comments)    Patient states he couldn't really breath    Spironolactone Anaphylaxis   Bactrim [Sulfamethoxazole-Trimethoprim] Other (See Comments)    Mouth burning   Daliresp [Roflumilast] Other (See Comments)    Dizzy, headache, leg pain per patient. 02/25/22. Tolerates in low doses    Jardiance [Empagliflozin] Nausea Only and Other (See Comments)    Lightheadness Dizziness   Penicillins Swelling and Other (See Comments)    Childhood rxn--MD stated he "almost died" Has patient had a PCN reaction causing immediate rash, facial/tongue/throat swelling, SOB or lightheadedness with hypotension:Yes Has patient had a PCN reaction causing severe rash involving mucus membranes or skin necrosis:unsure Has patient had a PCN reaction that required hospitalization:unsure Has patient had a PCN reaction occurring within the last 10 years:No If all of the above answers are "NO", then may proceed with Cephalosporin use.  Clindamycin Other (See Comments)    Tongue swelling   Doxycycline Nausea And Vomiting    Severe stomach upset per patient   E-Mycin  [Erythromycin Base] Swelling   Rosuvastatin Other (See Comments)    Myalgias (intolerance)    Immunization History  Administered Date(s) Administered   Influenza,inj,Quad PF,6+ Mos 09/17/2021   PNEUMOCOCCAL CONJUGATE-20 08/20/2021   Tdap 12/03/2011    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   Requires supplemental oxygen     Tobacco History: Social History   Tobacco Use  Smoking Status Former   Current packs/day: 0.00   Average packs/day: 2.0 packs/day for 46.0 years (92.0 ttl pk-yrs)   Types: Cigarettes   Start date: 01/28/1976   Quit date: 01/27/2022   Years since quitting: 2.0   Passive exposure: Past  Smokeless Tobacco Never  Tobacco Comments   Chews on a cigar.  09/16/2023 hfb   Counseling given: Not Answered Tobacco comments: Chews on a cigar.  09/16/2023 hfb   Outpatient Medications Prior to Visit  Medication Sig Dispense Refill   albuterol (PROVENTIL) (2.5 MG/3ML) 0.083% nebulizer solution TAKE 3 MLS BY NEBULIZATION EVERY 4 HOURS AS NEEDED FOR WHEEZING OR SHORTNESS OF BREATH 150 mL 11   allopurinol (ZYLOPRIM) 300 MG tablet Take 1 tablet (300 mg total) by mouth daily. 90 tablet 0   ALPRAZolam (XANAX) 0.5 MG tablet TAKE 1 TABLET(0.5 MG) BY MOUTH THREE TIMES DAILY 270 tablet 0   DALIRESP 500 MCG TABS tablet TAKE 1 TABLET(500 MCG) BY MOUTH DAILY 30 tablet 11   diclofenac Sodium (VOLTAREN) 1 % GEL Apply 1 Application topically in the morning and at bedtime.     EPINEPHrine 0.3 mg/0.3 mL IJ SOAJ injection Inject 0.3  mg into the muscle as needed for anaphylaxis. 1 each 0   fluticasone (FLONASE) 50 MCG/ACT nasal spray SHAKE LIQUID AND USE 2 SPRAYS IN EACH NOSTRIL DAILY (Patient taking differently: Place 1 spray into both nostrils daily as needed for allergies.) 16 g 2   gabapentin (NEURONTIN) 300 MG capsule Take 1 capsule (300 mg total) by mouth 3 (three) times daily. 90 capsule 4   guaiFENesin (MUCINEX) 600 MG 12 hr tablet Take 1 tablet (600 mg total) by mouth 2 (two) times daily. (Patient taking differently: Take 600 mg by mouth 2 (two) times daily as needed for cough or to loosen phlegm. Pt taking once daily) 60 tablet 5   ipratropium (ATROVENT) 0.06 % nasal spray Place 2 sprays into both nostrils 4 (four) times daily as needed for rhinitis. 45 mL 3   ipratropium-albuterol (DUONEB) 0.5-2.5 (3) MG/3ML SOLN Take 3 mLs by nebulization every 4 (four) hours as needed. 360 mL 5   loratadine (CLARITIN) 10 MG tablet Take 10 mg by mouth every other day. As needed     methocarbamol (ROBAXIN) 500 MG tablet Take 1 tablet (500 mg total) by mouth every 8 (eight) hours  as needed for muscle spasms. 30 tablet 1   metolazone (ZAROXOLYN) 2.5 MG tablet Take 1 tablet (2.5 mg total) by mouth 2 (two) times a week. Take 2.5 mg on Monday/Friday 8 tablet 11   mometasone-formoterol (DULERA) 100-5 MCG/ACT AERO INHALE 2 PUFFS INTO THE LUNGS TWICE DAILY 13 g 5   montelukast (SINGULAIR) 10 MG tablet TAKE 1 TABLET BY MOUTH EVERY DAY AT BEDTIME 30 tablet 5   omeprazole (PRILOSEC) 20 MG capsule TAKE 1 CAPSULE(20 MG) BY MOUTH TWICE DAILY BEFORE A MEAL FOR STOMACH ACID OR REFLUX 180 capsule 0   ondansetron (ZOFRAN-ODT) 4 MG disintegrating tablet Take 1 tablet (4 mg total) by mouth every 8 (eight) hours as needed for nausea or vomiting. 20 tablet 0   potassium chloride (KLOR-CON) 20 MEQ packet Take 60 mEq by mouth every morning AND 40 mEq every evening. 150 packet 5   predniSONE (DELTASONE) 10 MG tablet Take 1 tablet (10 mg total) by mouth daily  with breakfast. 30 tablet 5   Testosterone 20.25 MG/1.25GM (1.62%) GEL Place 1.25 g onto the skin daily. 112.5 g 0   Tiotropium Bromide Monohydrate (SPIRIVA RESPIMAT) 2.5 MCG/ACT AERS Inhale 2 puffs into the lungs daily. 4 g 6   torsemide (DEMADEX) 20 MG tablet Take 2 tablets (40 mg total) by mouth daily. 90 tablet 11   VENTOLIN HFA 108 (90 Base) MCG/ACT inhaler INHALE 2 PUFFS INTO THE LUNGS EVERY 6 HOURS AS NEEDED FOR WHEEZING 18 g 4   vitamin B-12 (CYANOCOBALAMIN) 1000 MCG tablet Take 1,000 mcg by mouth daily.     vitamin E 180 MG (400 UNITS) capsule Take 400 Units by mouth daily.     No facility-administered medications prior to visit.      Review of Systems  Review of Systems  Constitutional: Negative.   HENT:  Positive for postnasal drip.   Respiratory:  Positive for cough. Negative for shortness of breath and wheezing.   Cardiovascular: Negative.  Negative for leg swelling.   Physical Exam  There were no vitals taken for this visit. Physical Exam Constitutional:      General: He is not in acute distress.    Appearance: Normal appearance. He is obese. He is not ill-appearing.  HENT:     Head: Normocephalic and atraumatic.     Mouth/Throat:     Mouth: Mucous membranes are moist.     Pharynx: Oropharynx is clear.  Cardiovascular:     Rate and Rhythm: Normal rate and regular rhythm.  Pulmonary:     Effort: Pulmonary effort is normal. No respiratory distress.     Breath sounds: Normal breath sounds. No wheezing or rhonchi.     Comments: Scattered rhonchi right lung, clear with cough. Faint exp wheezing R>L Skin:    General: Skin is warm and dry.  Neurological:     General: No focal deficit present.     Mental Status: He is alert and oriented to person, place, and time. Mental status is at baseline.  Psychiatric:        Mood and Affect: Mood normal.        Behavior: Behavior normal.        Thought Content: Thought content normal.        Judgment: Judgment normal.       Lab Results:  CBC    Component Value Date/Time   WBC 8.4 01/07/2024 1432   RBC 4.35 01/07/2024 1432   HGB 14.1 01/07/2024 1432   HGB 15.1 02/26/2023 1223  HCT 42.0 01/07/2024 1432   HCT 45.0 02/26/2023 1223   PLT 279.0 01/07/2024 1432   PLT 216 02/26/2023 1223   MCV 96.5 01/07/2024 1432   MCV 94 02/26/2023 1223   MCH 32.7 06/02/2023 0108   MCHC 33.5 01/07/2024 1432   RDW 13.9 01/07/2024 1432   RDW 12.4 02/26/2023 1223   LYMPHSABS 0.5 (L) 01/07/2024 1432   LYMPHSABS 0.7 09/26/2021 1318   MONOABS 0.5 01/07/2024 1432   EOSABS 0.0 01/07/2024 1432   EOSABS 0.0 09/26/2021 1318   BASOSABS 0.0 01/07/2024 1432   BASOSABS 0.0 09/26/2021 1318    BMET    Component Value Date/Time   NA 140 02/04/2024 1411   NA 140 02/26/2023 1223   K 3.4 (L) 02/04/2024 1411   CL 97 (L) 02/04/2024 1411   CO2 34 (H) 02/04/2024 1411   GLUCOSE 138 (H) 02/04/2024 1411   BUN 13 02/04/2024 1411   BUN 21 02/26/2023 1223   CREATININE 0.99 02/04/2024 1411   CREATININE 1.04 07/10/2020 1035   CALCIUM 9.0 02/04/2024 1411   GFRNONAA >60 02/04/2024 1411   GFRAA >60 02/14/2020 1454    BNP    Component Value Date/Time   BNP 286.0 (H) 02/04/2024 1411    ProBNP    Component Value Date/Time   PROBNP 167.0 (H) 09/14/2022 1522    Imaging: CT CHEST WO CONTRAST Result Date: 02/11/2024 CLINICAL DATA:  Left upper quadrant abdominal pain, history of rib fractures after coughing several weeks ago, recent RSV, left-sided chest wall and abdominal wall bruising EXAM: CT CHEST AND ABDOMEN WITHOUT CONTRAST TECHNIQUE: Multidetector CT imaging of the chest and abdomen was performed following the standard protocol without intravenous contrast. RADIATION DOSE REDUCTION: This exam was performed according to the departmental dose-optimization program which includes automated exposure control, adjustment of the mA and/or kV according to patient size and/or use of iterative reconstruction technique. COMPARISON:   01/07/2024, 09/12/2020 FINDINGS: CT CHEST FINDINGS WITHOUT CONTRAST Cardiovascular: Unenhanced imaging of the heart demonstrates mild cardiomegaly without pericardial effusion. There is a multi lead cardiac pacer, proximal lead coiled in the right atrium and 2 distal leads extending into the right ventricle. Normal caliber of the thoracic aorta. Atherosclerosis of the aorta and coronary vasculature. Mediastinum/Nodes: No enlarged mediastinal or axillary lymph nodes. Thyroid gland, trachea, and esophagus demonstrate no significant findings. Lungs/Pleura: There is an intercostal lung hernia at the left posterolateral eighth intercostal space, with a portion of the left lower lobe and lingula extending through the intercostal defect. There is a small left pleural effusion, with minimal left lower lobe atelectasis is noted. No acute airspace disease. No pneumothorax. Mild emphysema. Musculoskeletal: There is a healing left posterior ninth rib fracture at the costovertebral junction, with mild callus formation. There is a displaced fracture of the costal cartilage of the left anterior ninth rib, likely accounting for the intercostal hernia described above. There is edema within the overlying musculature at the left anterior ninth rib consistent with intramuscular hematoma. There is evidence of a chronic healed left anterior seventh rib fracture. No other acute bony abnormalities. CT ABDOMEN FINDINGS WITHOUT CONTRAST Hepatobiliary: Unremarkable unenhanced appearance of the liver and gallbladder. Pancreas: Unremarkable unenhanced appearance. Spleen: Unremarkable unenhanced appearance. Adrenals/Urinary Tract: No urinary tract calculi or obstructive uropathy. The adrenals are stable. Stomach/Bowel: No bowel obstruction or ileus. Normal appendix right lower quadrant. No bowel wall thickening or inflammatory change. Vascular/Lymphatic: Aortic atherosclerosis. No pathologic adenopathy. Other: No free fluid or free  intraperitoneal gas. No abdominal wall hernia. Musculoskeletal: No  acute displaced fracture. Reconstructed images demonstrate no additional findings. IMPRESSION: 1. Segmental left ninth rib fracture, with a healing fracture at the costovertebral junction and a displaced fracture through the costal cartilage. There is a resulting intercostal lung hernia in the 8th intercostal space, with portions of the left lower lobe and lingula protruding through the intercostal defect. 2. Intramuscular edema within the left lateral chest wall overlying the left ninth and tenth ribs, consistent with sequela of previous trauma. 3. Small left pleural effusion, with minimal left lower lobe atelectasis. 4. Cardiomegaly. 5. Aortic Atherosclerosis (ICD10-I70.0). Coronary artery atherosclerosis. These results will be called to the ordering clinician or representative by the Radiologist Assistant, and communication documented in the PACS or [employer]. Electronically Signed   By: Sharlet Salina M.D.   On: 02/11/2024 16:51   CT ABDOMEN WO CONTRAST Result Date: 02/11/2024 CLINICAL DATA:  Left upper quadrant abdominal pain, history of rib fractures after coughing several weeks ago, recent RSV, left-sided chest wall and abdominal wall bruising EXAM: CT CHEST AND ABDOMEN WITHOUT CONTRAST TECHNIQUE: Multidetector CT imaging of the chest and abdomen was performed following the standard protocol without intravenous contrast. RADIATION DOSE REDUCTION: This exam was performed according to the departmental dose-optimization program which includes automated exposure control, adjustment of the mA and/or kV according to patient size and/or use of iterative reconstruction technique. COMPARISON:  01/07/2024, 09/12/2020 FINDINGS: CT CHEST FINDINGS WITHOUT CONTRAST Cardiovascular: Unenhanced imaging of the heart demonstrates mild cardiomegaly without pericardial effusion. There is a multi lead cardiac pacer, proximal lead coiled in the right  atrium and 2 distal leads extending into the right ventricle. Normal caliber of the thoracic aorta. Atherosclerosis of the aorta and coronary vasculature. Mediastinum/Nodes: No enlarged mediastinal or axillary lymph nodes. Thyroid gland, trachea, and esophagus demonstrate no significant findings. Lungs/Pleura: There is an intercostal lung hernia at the left posterolateral eighth intercostal space, with a portion of the left lower lobe and lingula extending through the intercostal defect. There is a small left pleural effusion, with minimal left lower lobe atelectasis is noted. No acute airspace disease. No pneumothorax. Mild emphysema. Musculoskeletal: There is a healing left posterior ninth rib fracture at the costovertebral junction, with mild callus formation. There is a displaced fracture of the costal cartilage of the left anterior ninth rib, likely accounting for the intercostal hernia described above. There is edema within the overlying musculature at the left anterior ninth rib consistent with intramuscular hematoma. There is evidence of a chronic healed left anterior seventh rib fracture. No other acute bony abnormalities. CT ABDOMEN FINDINGS WITHOUT CONTRAST Hepatobiliary: Unremarkable unenhanced appearance of the liver and gallbladder. Pancreas: Unremarkable unenhanced appearance. Spleen: Unremarkable unenhanced appearance. Adrenals/Urinary Tract: No urinary tract calculi or obstructive uropathy. The adrenals are stable. Stomach/Bowel: No bowel obstruction or ileus. Normal appendix right lower quadrant. No bowel wall thickening or inflammatory change. Vascular/Lymphatic: Aortic atherosclerosis. No pathologic adenopathy. Other: No free fluid or free intraperitoneal gas. No abdominal wall hernia. Musculoskeletal: No acute displaced fracture. Reconstructed images demonstrate no additional findings. IMPRESSION: 1. Segmental left ninth rib fracture, with a healing fracture at the costovertebral junction and a  displaced fracture through the costal cartilage. There is a resulting intercostal lung hernia in the 8th intercostal space, with portions of the left lower lobe and lingula protruding through the intercostal defect. 2. Intramuscular edema within the left lateral chest wall overlying the left ninth and tenth ribs, consistent with sequela of previous trauma. 3. Small left pleural effusion, with minimal left lower lobe atelectasis.  4. Cardiomegaly. 5. Aortic Atherosclerosis (ICD10-I70.0). Coronary artery atherosclerosis. These results will be called to the ordering clinician or representative by the Radiologist Assistant, and communication documented in the PACS or [employer]. Electronically Signed   By: Sharlet Salina M.D.   On: 02/11/2024 16:51     Assessment & Plan:   1. Pleural effusion (Primary) - CT Chest Wo Contrast; Future  2. Herniation of lung - CT Chest Wo Contrast; Future   COPD/Asthma Patient has had a productive cough with clear sputum for the last several weeks following recent pneumonia and RSV infection. Cough has improved considerably. Currently on Dulera, Spiriva, Daliresp, and Prednisone 10mg  daily. Noted wheezing on examination. -Increase Prednisone to 20mg  daily for 7 days, then return to 10mg  daily. Refill Prednisone prescription provided. -Advised patient take Delsym twice daily for cough suppression. -Patient benefits from nebulizer treatments for breakthrough COPD symptoms, continue ipratropium-albuterol q6 hours as needed for shortness of breath/wheezing   Lung herniation -CT imaging in February 2025 showed intercostal lung hernia at the left posterolateral eighth intercostal space. This is likely secondary to cough. He denies recent fall or trauma. Cough has improved, recommend patient take delsym twice a day for cough suppression. Would repeat CT imaging in 4-6 weeks, consider referral to [facility]CTS for surgical evaluation. Will discuss case with Dr. Delton Coombes as well.    Pleural Effusion -Small left-sided pleural effusion noted on CT scan, likely secondary to recent pneumonia/RSV infection and underlying HF. I will discuss with Dr. Delton Coombes but would likely recommend conservatively management due to increased risk for complications secondary to emphysema and lung herniation. Continue Torsemide 40mg  daily as prescribed.   Sleep Apnea Using non-invasive ventilator managed by [employer] [facility]. Wears device at night and during the day when at home. -Continue current management.  Heart Failure Chronic management with Torsemide 40mg  daily. Recent weight loss noted. No signs of fluid volume overload on exam.  -Continue current management.  Postnasal Drip Reports symptoms primarily at night, currently using Ricola honey and lemons for relief. -Resume Astelin nasal spray. -Refill Astelin prescription if needed.      Glenford Bayley, NP 02/15/2024

## 2024-02-16 ENCOUNTER — Telehealth: Payer: Self-pay | Admitting: Primary Care

## 2024-02-16 NOTE — Telephone Encounter (Signed)
 Please let patient know that I spoke with Dr. Delton Coombes, he wanted patient referred to TCT due to displaced rib fracture and lung herniation. Referral has been placed. Pleural effusion is too small for thoracentesis procedure.

## 2024-02-16 NOTE — Telephone Encounter (Signed)
 Thank you! This is now being completed

## 2024-02-16 NOTE — Addendum Note (Signed)
 Addended by: Glenford Bayley on: 02/16/2024 08:44 AM   Modules accepted: Orders

## 2024-02-16 NOTE — Telephone Encounter (Signed)
 PCC's, please see 02/15/2024 OV note.

## 2024-02-17 ENCOUNTER — Ambulatory Visit: Payer: Medicare Other | Admitting: Adult Health

## 2024-02-17 LAB — CUP PACEART REMOTE DEVICE CHECK
Battery Remaining Longevity: 76 mo
Battery Remaining Percentage: 79 %
Battery Voltage: 2.99 V
Brady Statistic AP VP Percent: 1 %
Brady Statistic AP VS Percent: 1 %
Brady Statistic AS VP Percent: 98 %
Brady Statistic AS VS Percent: 1 %
Brady Statistic RA Percent Paced: 1 %
Date Time Interrogation Session: 20250226094543
Implantable Lead Connection Status: 753985
Implantable Lead Connection Status: 753985
Implantable Lead Connection Status: 753985
Implantable Lead Implant Date: 20220427
Implantable Lead Implant Date: 20220427
Implantable Lead Implant Date: 20231122
Implantable Lead Location: 753858
Implantable Lead Location: 753859
Implantable Lead Location: 753860
Implantable Pulse Generator Implant Date: 20231122
Lead Channel Impedance Value: 480 Ohm
Lead Channel Impedance Value: 490 Ohm
Lead Channel Impedance Value: 630 Ohm
Lead Channel Pacing Threshold Amplitude: 0.625 V
Lead Channel Pacing Threshold Amplitude: 0.625 V
Lead Channel Pacing Threshold Amplitude: 0.875 V
Lead Channel Pacing Threshold Pulse Width: 0.5 ms
Lead Channel Pacing Threshold Pulse Width: 0.5 ms
Lead Channel Pacing Threshold Pulse Width: 0.5 ms
Lead Channel Sensing Intrinsic Amplitude: 12 mV
Lead Channel Sensing Intrinsic Amplitude: 2.6 mV
Lead Channel Setting Pacing Amplitude: 1.625
Lead Channel Setting Pacing Amplitude: 2 V
Lead Channel Setting Pacing Amplitude: 2 V
Lead Channel Setting Pacing Pulse Width: 0.5 ms
Lead Channel Setting Pacing Pulse Width: 0.5 ms
Lead Channel Setting Sensing Sensitivity: 2 mV
Pulse Gen Model: 3222
Pulse Gen Serial Number: 3979797

## 2024-02-18 NOTE — Telephone Encounter (Signed)
 I called and spoke with pt. Pt states he has already been called and had this scheduled. Nfn

## 2024-02-21 ENCOUNTER — Ambulatory Visit (HOSPITAL_COMMUNITY)
Admission: RE | Admit: 2024-02-21 | Discharge: 2024-02-21 | Disposition: A | Discharge status: Home / Self Care | Payer: Medicare Other | Source: Ambulatory Visit | Attending: Family Medicine | Admitting: Family Medicine

## 2024-02-21 DIAGNOSIS — R2232 Localized swelling, mass and lump, left upper limb: Secondary | ICD-10-CM | POA: Diagnosis not present

## 2024-02-22 ENCOUNTER — Telehealth: Payer: Self-pay

## 2024-02-22 NOTE — Telephone Encounter (Signed)
 Copied from CRM [id number]. Topic: General - Other >> Feb 22, 2024  4:20 PM Sim Boast F wrote: Reason for CRM: Patient was seen 02/08/24 and is calling to follow up on request for mobility scooter. If call back is made on 02/23/24, please call after 11am

## 2024-02-23 ENCOUNTER — Telehealth: Admitting: Emergency Medicine

## 2024-02-23 ENCOUNTER — Encounter: Payer: Self-pay | Admitting: Emergency Medicine

## 2024-02-23 ENCOUNTER — Telehealth: Payer: Self-pay | Admitting: Emergency Medicine

## 2024-02-23 ENCOUNTER — Other Ambulatory Visit: Payer: Self-pay | Admitting: Internal Medicine

## 2024-02-23 DIAGNOSIS — J449 Chronic obstructive pulmonary disease, unspecified: Secondary | ICD-10-CM

## 2024-02-23 DIAGNOSIS — J9611 Chronic respiratory failure with hypoxia: Secondary | ICD-10-CM

## 2024-02-23 DIAGNOSIS — S2232XK Fracture of one rib, left side, subsequent encounter for fracture with nonunion: Secondary | ICD-10-CM

## 2024-02-23 DIAGNOSIS — M5030 Other cervical disc degeneration, unspecified cervical region: Secondary | ICD-10-CM

## 2024-02-23 DIAGNOSIS — G4733 Obstructive sleep apnea (adult) (pediatric): Secondary | ICD-10-CM

## 2024-02-23 DIAGNOSIS — M545 Low back pain, unspecified: Secondary | ICD-10-CM

## 2024-02-23 DIAGNOSIS — F17200 Nicotine dependence, unspecified, uncomplicated: Secondary | ICD-10-CM

## 2024-02-23 DIAGNOSIS — M170 Bilateral primary osteoarthritis of knee: Secondary | ICD-10-CM

## 2024-02-23 DIAGNOSIS — J431 Panlobular emphysema: Secondary | ICD-10-CM

## 2024-02-23 DIAGNOSIS — M4802 Spinal stenosis, cervical region: Secondary | ICD-10-CM

## 2024-02-23 DIAGNOSIS — J441 Chronic obstructive pulmonary disease with (acute) exacerbation: Secondary | ICD-10-CM

## 2024-02-23 DIAGNOSIS — G8929 Other chronic pain: Secondary | ICD-10-CM

## 2024-02-23 NOTE — Telephone Encounter (Signed)
 Can you please write the DME order so I can send it ?

## 2024-02-23 NOTE — Progress Notes (Signed)
 Virtual Visit via Video Note  I connected with [name] on 02/23/24 at 10:00 AM EST by a video enabled telemedicine application and verified that I am speaking with the correct person using two identifiers.  Location: Patient: Home Provider: Office   I discussed the limitations of evaluation and management by telemedicine and the availability of in person appointments. The patient expressed understanding and agreed to proceed.  History of Present Illness: Mr. Mainwaring has multifactorial chronic hypoxemic respiratory failure in the setting of severe COPD, continued tobacco use.  Also with ischemic cardiomyopathy, third-degree heart block with a pacemaker (followed by advanced CHF team), obesity hypoventilation syndrome that has been managed with a trilogy ventilator. Most recently had a severe flare due to RSV pneumonia that resulted in a lot of cough, rib fracture with resultant displacement and left lung herniation through the intercostal space.  Also with a left pleural effusion.  Observations/Objective: He is still coughing a lot, feels some movement on the L rib margin. Clear mucous. He is on prednisone 10mg . He is on dulera, spiriva, albuterol prn. He is on daliresp. He is using his nocturnal vent at night - sometimes during day w naps. He is now off exertional O2.  Remains claritin, singulair, omeprazole bid..   Assessment and Plan: -Continue same inhaled medications for COPD.  I am hesitant to increase his prednisone given the rib fracture, risk for bony loss -Prednisone 10 mg daily -Add back guaifenesin -He will see thoracic surgery to see if there is a role for any intervention given the apparently displaced rib fracture.  He would be high risk for surgery and there may be no role unless he is at high risk for pneumothorax due to rib impact on his lung -Follow CT chest to evaluate the hernia, lung and rib healing  Follow Up Instructions: He has a follow-up visit in April after CT  chest   I discussed the assessment and treatment plan with the patient. The patient was provided an opportunity to ask questions and all were answered. The patient agreed with the plan and demonstrated an understanding of the instructions.   The patient was advised to call back or seek an in-person evaluation if the symptoms worsen or if the condition fails to improve as anticipated.  I provided 30  minutes of non-face-to-face time during this encounter.   Leslye Peer, MD

## 2024-02-23 NOTE — Telephone Encounter (Signed)
 Patient would like CT scan done at [facility]. Patient phone number is [phone].

## 2024-02-23 NOTE — Telephone Encounter (Signed)
 Scedueled for Robert Church and patient is aware

## 2024-02-25 ENCOUNTER — Telehealth: Payer: Self-pay

## 2024-02-25 DIAGNOSIS — J449 Chronic obstructive pulmonary disease, unspecified: Secondary | ICD-10-CM | POA: Diagnosis not present

## 2024-02-25 DIAGNOSIS — J9610 Chronic respiratory failure, unspecified whether with hypoxia or hypercapnia: Secondary | ICD-10-CM | POA: Diagnosis not present

## 2024-02-25 NOTE — Telephone Encounter (Signed)
 Copied from CRM [id number]. Topic: Clinical - Lab/Test Results >> Feb 25, 2024  4:08 PM Denese Killings wrote: Reason for CRM: Patient is requesting a callback regarding Ultrasound results.

## 2024-02-28 ENCOUNTER — Ambulatory Visit: Payer: Medicare Other | Admitting: Student

## 2024-02-28 NOTE — Telephone Encounter (Signed)
 Will reach out to [facility] again tomorrow, unable to reach them today. Will make patient aware

## 2024-02-28 NOTE — Telephone Encounter (Signed)
**Note De-identified  [place] Obfuscation** Please advise 

## 2024-02-29 ENCOUNTER — Encounter: Payer: Self-pay | Admitting: Family Medicine

## 2024-02-29 ENCOUNTER — Ambulatory Visit (INDEPENDENT_AMBULATORY_CARE_PROVIDER_SITE_OTHER): Admitting: Family Medicine

## 2024-02-29 VITALS — BP 112/60 | HR 93 | Temp 98.5°F | Ht 70.0 in | Wt 229.2 lb

## 2024-02-29 DIAGNOSIS — I5042 Chronic combined systolic (congestive) and diastolic (congestive) heart failure: Secondary | ICD-10-CM | POA: Diagnosis not present

## 2024-02-29 DIAGNOSIS — M549 Dorsalgia, unspecified: Secondary | ICD-10-CM

## 2024-02-29 DIAGNOSIS — J441 Chronic obstructive pulmonary disease with (acute) exacerbation: Secondary | ICD-10-CM

## 2024-02-29 DIAGNOSIS — J431 Panlobular emphysema: Secondary | ICD-10-CM | POA: Diagnosis not present

## 2024-02-29 DIAGNOSIS — D2262 Melanocytic nevi of left upper limb, including shoulder: Secondary | ICD-10-CM

## 2024-02-29 DIAGNOSIS — J984 Other disorders of lung: Secondary | ICD-10-CM | POA: Insufficient documentation

## 2024-02-29 DIAGNOSIS — M1A9XX1 Chronic gout, unspecified, with tophus (tophi): Secondary | ICD-10-CM

## 2024-02-29 DIAGNOSIS — R1084 Generalized abdominal pain: Secondary | ICD-10-CM

## 2024-02-29 DIAGNOSIS — M62830 Muscle spasm of back: Secondary | ICD-10-CM

## 2024-02-29 DIAGNOSIS — J9611 Chronic respiratory failure with hypoxia: Secondary | ICD-10-CM

## 2024-02-29 DIAGNOSIS — K7030 Alcoholic cirrhosis of liver without ascites: Secondary | ICD-10-CM

## 2024-02-29 MED ORDER — POTASSIUM CHLORIDE 20 MEQ PO PACK: PACK | ORAL | 5 refills | Status: DC

## 2024-02-29 MED ORDER — BACLOFEN 10 MG PO TABS: 10.0000 mg | ORAL_TABLET | Freq: Three times a day (TID) | ORAL | 0 refills | Status: DC

## 2024-02-29 NOTE — Patient Instructions (Signed)
 Continue to use IcyHot patches to your left upper back.  I have sent in baclofen for you to use up to 3 times a day as a muscle relaxer to try to help loosen the muscle.  Follow-up with CT and thoracic surgery consult as scheduled.  Labs ordered, may come by tomorrow sometime to have them drawn.  Refilled potassium today.  If this baclofen is not helpful, please let me know and we can change if we need to.  Follow-up as needed

## 2024-02-29 NOTE — Telephone Encounter (Signed)
 Spoke with [facility], bumping [place] up to a stat to be read prior to patients appointment with [place] today at 3pm.

## 2024-02-29 NOTE — Progress Notes (Signed)
 Acute Office Visit  Subjective:     Patient ID: Robert Church, male    DOB: [birth year], 68 y.o.   MRN: [id number]  Chief Complaint  Patient presents with   Back Pain    Upper back pain, feels bulging when he coughs, history of broken ribs    HPI Patient is in today for evaluation of left upper back pain. Has current left eighth rib fracture with lung herniation into the rib defect. Reports that he has been able to move back to his bedroom from sleeping on the recliner and is having some increased pain in the left upper back, especially with coughing. Has tried meloxicam, not helpful. Also has Voltaren gel that has not been helping. Does have some IcyHot patches that have been helping. Reports that he will see someone about getting a scooter/chair to help with ambulation in the next week. Also has follow-up chest CT with thoracic surgery consult at the end of this week to see if anything can be done about the lung herniation. Denies other concerns today. Medical history as outlined below  ROS Per HPI      Objective:    BP 112/60 (BP Location: Left Arm, Patient Position: Sitting)   Pulse 93   Temp 98.5 F (36.9 C) (Temporal)   Ht 5\' 10"  (1.778 m)   Wt 229 lb 3.2 oz (104 kg)   SpO2 91%   BMI 32.89 kg/m    Physical Exam Vitals and nursing note reviewed.  Constitutional:      General: He is not in acute distress.    Appearance: Normal appearance. He is obese.     Comments: In wheelchair for visit.  Appears fatigued today  HENT:     Head: Normocephalic and atraumatic.  Eyes:     Extraocular Movements: Extraocular movements intact.  Cardiovascular:     Rate and Rhythm: Normal rate and regular rhythm.     Pulses: Normal pulses.     Heart sounds: Normal heart sounds.  Pulmonary:     Effort: No respiratory distress.     Breath sounds: Wheezing and rhonchi present.     Comments: Mild wheezing and rhonchi to right lower lobe, mild cough in office.  Has improved from  last visit.  Work of breathing has improved from last visit Musculoskeletal:        General: Swelling and tenderness present.     Cervical back: Normal range of motion.     Comments: Limited ROM to multiple joints, swelling, some gouty tophi to multiple joints as well  Lymphadenopathy:     Cervical: No cervical adenopathy.  Skin:    General: Skin is warm and dry.     Coloration: Skin is not jaundiced.  Neurological:     Mental Status: He is alert and oriented to person, place, and time.  Psychiatric:        Mood and Affect: Mood normal.        Behavior: Behavior normal.     No results found for any visits on 02/29/24.      Assessment & Plan:   Chronic obstructive pulmonary disease with acute exacerbation (HCC) -     Comprehensive metabolic panel; Future  Chronic combined systolic and diastolic CHF (congestive heart failure) (HCC) -     Comprehensive metabolic panel; Future  Panlobular emphysema (HCC) -     Comprehensive metabolic panel; Future  Chronic respiratory failure with hypoxia (HCC) -     Comprehensive metabolic panel; Future  Herniation of left lung  Generalized abdominal pain -     Uric acid; Future -     Comprehensive metabolic panel; Future  Upper back pain on left side  Spasm of back muscles -     Baclofen; Take 1 tablet (10 mg total) by mouth 3 (three) times daily.  Dispense: 30 each; Refill: 0  Melanocytic nevus of left upper extremity -     Ambulatory referral to Dermatology  Alcoholic cirrhosis of liver without ascites (HCC) -     VITAMIN D 25 Hydroxy (Vit-D Deficiency, Fractures); Future  Chronic tophaceous gout -     Uric acid; Future -     Comprehensive metabolic panel; Future -     VITAMIN D 25 Hydroxy (Vit-D Deficiency, Fractures); Future  Other orders -     Potassium Chloride; Take 60 mEq by mouth every morning AND 40 mEq every evening.  Dispense: 150 packet; Refill: 5  Continue to use IcyHot patches to the area.  Meds ordered this  encounter  Medications   potassium chloride (KLOR-CON) 20 MEQ packet    Sig: Take 60 mEq by mouth every morning AND 40 mEq every evening.    Dispense:  150 packet    Refill:  5    D/C tablets Please cancel all previous orders for current medication. Change in dosage or pill size.   baclofen (LIORESAL) 10 MG tablet    Sig: Take 1 tablet (10 mg total) by mouth 3 (three) times daily.    Dispense:  30 each    Refill:  0    Return if symptoms worsen or fail to improve.  Moshe Cipro, FNP

## 2024-03-01 ENCOUNTER — Telehealth (HOSPITAL_COMMUNITY): Payer: Self-pay | Admitting: Cardiology

## 2024-03-01 NOTE — Telephone Encounter (Signed)
 Patient called to request medication changes. Reports his torsemide is causing severe leg pains. Reports his pains are daily Reports he hasn't taken toresmide this morning and does not have pains.  Aware his uric acid level is elevated -aware this is managed by PCP -reports he take allopurinol daily -educated on gout management (daily prevention meds vs acute flare treatment)  -pt would still like to review stopping torsemide  Please advise

## 2024-03-01 NOTE — Telephone Encounter (Signed)
 Torsemide would not typically cause leg pains itself unless he had electrolyte disturbances or gout flare (should be on chronic gout medications). PCP ordered labs yesterday but I do not see results.

## 2024-03-02 ENCOUNTER — Ambulatory Visit: Payer: Medicare Other | Admitting: Adult Health

## 2024-03-02 ENCOUNTER — Ambulatory Visit (HOSPITAL_COMMUNITY)
Admission: RE | Admit: 2024-03-02 | Discharge: 2024-03-02 | Disposition: A | Discharge status: Home / Self Care | Source: Ambulatory Visit | Attending: Emergency Medicine | Admitting: Emergency Medicine

## 2024-03-02 ENCOUNTER — Other Ambulatory Visit

## 2024-03-02 DIAGNOSIS — J90 Pleural effusion, not elsewhere classified: Secondary | ICD-10-CM | POA: Diagnosis not present

## 2024-03-02 DIAGNOSIS — K7030 Alcoholic cirrhosis of liver without ascites: Secondary | ICD-10-CM | POA: Diagnosis not present

## 2024-03-02 DIAGNOSIS — J441 Chronic obstructive pulmonary disease with (acute) exacerbation: Secondary | ICD-10-CM

## 2024-03-02 DIAGNOSIS — J9611 Chronic respiratory failure with hypoxia: Secondary | ICD-10-CM | POA: Diagnosis not present

## 2024-03-02 DIAGNOSIS — E559 Vitamin D deficiency, unspecified: Secondary | ICD-10-CM

## 2024-03-02 DIAGNOSIS — J431 Panlobular emphysema: Secondary | ICD-10-CM | POA: Diagnosis not present

## 2024-03-02 DIAGNOSIS — M1A9XX1 Chronic gout, unspecified, with tophus (tophi): Secondary | ICD-10-CM | POA: Diagnosis not present

## 2024-03-02 DIAGNOSIS — R1084 Generalized abdominal pain: Secondary | ICD-10-CM | POA: Diagnosis not present

## 2024-03-02 DIAGNOSIS — J439 Emphysema, unspecified: Secondary | ICD-10-CM | POA: Diagnosis not present

## 2024-03-02 DIAGNOSIS — S2232XK Fracture of one rib, left side, subsequent encounter for fracture with nonunion: Secondary | ICD-10-CM | POA: Insufficient documentation

## 2024-03-02 DIAGNOSIS — I5042 Chronic combined systolic (congestive) and diastolic (congestive) heart failure: Secondary | ICD-10-CM

## 2024-03-02 DIAGNOSIS — S2232XA Fracture of one rib, left side, initial encounter for closed fracture: Secondary | ICD-10-CM | POA: Diagnosis not present

## 2024-03-02 LAB — COMPREHENSIVE METABOLIC PANEL
ALT: 14 U/L (ref 0–53)
AST: 17 U/L (ref 0–37)
Albumin: 4 g/dL (ref 3.5–5.2)
Alkaline Phosphatase: 62 U/L (ref 39–117)
BUN: 11 mg/dL (ref 6–23)
CO2: 34 meq/L — ABNORMAL HIGH (ref 19–32)
Calcium: 9 mg/dL (ref 8.4–10.5)
Chloride: 97 meq/L (ref 96–112)
Creatinine, Ser: 0.91 mg/dL (ref 0.40–1.50)
GFR: 88.43 mL/min (ref >60.00)
Glucose, Bld: 119 mg/dL — ABNORMAL HIGH (ref 70–99)
Potassium: 3.8 meq/L (ref 3.5–5.1)
Sodium: 141 meq/L (ref 135–145)
Total Bilirubin: 0.6 mg/dL (ref 0.2–1.2)
Total Protein: 6.7 g/dL (ref 6.0–8.3)

## 2024-03-02 LAB — URIC ACID: Uric Acid, Serum: 10.5 mg/dL — ABNORMAL HIGH (ref 4.0–7.8)

## 2024-03-02 LAB — VITAMIN D 25 HYDROXY (VIT D DEFICIENCY, FRACTURES): VITD: 9.16 ng/mL — ABNORMAL LOW (ref 30.00–100.00)

## 2024-03-02 NOTE — Telephone Encounter (Signed)
Pt aware.

## 2024-03-03 ENCOUNTER — Encounter: Payer: Self-pay | Admitting: Thoracic Surgery (Cardiothoracic Vascular Surgery)

## 2024-03-03 ENCOUNTER — Institutional Professional Consult (permissible substitution): Payer: Medicare Other | Admitting: Thoracic Surgery (Cardiothoracic Vascular Surgery)

## 2024-03-03 ENCOUNTER — Encounter: Payer: Medicare Other | Admitting: Thoracic Surgery (Cardiothoracic Vascular Surgery)

## 2024-03-03 ENCOUNTER — Encounter: Payer: Self-pay | Admitting: Family Medicine

## 2024-03-03 VITALS — BP 118/80 | HR 89 | Resp 20 | Ht 70.0 in | Wt 229.0 lb

## 2024-03-03 DIAGNOSIS — J984 Other disorders of lung: Secondary | ICD-10-CM

## 2024-03-03 MED ORDER — VITAMIN D (ERGOCALCIFEROL) 1.25 MG (50000 UNIT) PO CAPS
50000.0000 [IU] | ORAL_CAPSULE | ORAL | 0 refills | Status: DC
Start: 2024-03-03 — End: 2024-06-30

## 2024-03-03 NOTE — Telephone Encounter (Signed)
 Provider commented on patients lab

## 2024-03-05 ENCOUNTER — Other Ambulatory Visit: Payer: Self-pay | Admitting: Internal Medicine

## 2024-03-05 DIAGNOSIS — K2100 Gastro-esophageal reflux disease with esophagitis, without bleeding: Secondary | ICD-10-CM

## 2024-03-07 NOTE — Progress Notes (Unsigned)
 [address]       [place] [address]             [phone]                    [facility] Medical Record #[id number] Date of Birth: [birth year]  Referring: Glenford Bayley, NP Primary Care: [name], MD Primary Cardiologist: Nanetta Batty, MD  Chief Complaint:    Chief Complaint  Patient presents with   Lung herniation, Displaced Rib    New patient consult, chest CT 2/21    History of Present Illness:    [place] Church 68 y.o. male presents for surgical evaluation of a left-sided chest wall hernia.  The patient states that he had significant pneumonia with severe coughing earlier this year.  He then presented to his primary care physician recently complaining of some mid back pain.  He denies any shortness of breath or other chest wall pain.    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   Requires supplemental oxygen     Past Surgical History:  Procedure Laterality Date   BIV UPGRADE N/A 10/07/2021   Procedure: BIV PPM UPGRADE;  Surgeon: Regan Lemming, MD;  Location: [facility] INVASIVE CV LAB;  Service: Cardiovascular;  Laterality: N/A;   BIV UPGRADE N/A 11/11/2022   Procedure: BIV PPM UPGRADE;  Surgeon: Regan Lemming, MD;  Location: [facility] INVAS[facility] CV [facility];  Service: Cardiovascular;  Laterality: N/A;   LEFT HEART CATH AND CORONARY ANGIOGRAPHY N/A 04/19/2017   Procedure: Left Heart Cath and Coronary Angiography;  Surgeon: Lyn Records, MD;  Location: [facility] INVASIVE CV LAB;   Service: Cardiovascular;  Laterality: N/A;   PACEMAKER IMPLANT N/A 04/16/2021   Procedure: PACEMAKER IMPLANT;  Surgeon: Regan Lemming, MD;  Location: [facility] INVASIVE CV [facility];  Service: Cardiovascular;  Laterality: N/A;   RIGHT HEART CATH N/A 03/09/2023   Procedure: RIGHT HEART CATH;  Surgeon: Dolores Patty, MD;  Location: [facility] IN[facility] CV [facility];  Service: Cardiovascular;  Laterality: N/A;   RIGHT/LEFT HEART CATH AND CORONARY ANGIOGRAPHY N/A 08/22/2021   Procedure: RIGHT/LEFT HEART CATH AND CORONARY ANGIOGRAPHY;  Surgeon: Dolores Patty, MD;  Location: [facility] INVASIVE CV [facility];  Service: Cardiovascular;  Laterality: N/A;   TONSILLECTOMY      Family History  Problem Relation Age of Onset   Lung cancer Father    High blood pressure Mother    Diabetes Maternal Grandmother    Colon polyps Neg Hx    Esophageal cancer Neg Hx    Pancreatic cancer Neg Hx    Stomach cancer Neg Hx      Social History   Tobacco Use  Smoking Status Former   Current packs/day: 0.00   Average packs/day: 2.0 packs/day for 46.0 years (92.0 ttl pk-yrs)   Types: Cigarettes   Start date:  01/28/1976   Quit date: 01/27/2022   Years since quitting: 2.1   Passive exposure: Past  Smokeless Tobacco Never  Tobacco Comments   Chews on a cigar.  09/16/2023 hfb      Smokes one occasionally per pt. 03/03/24     Social History   Substance and Sexual Activity  Alcohol Use Not Currently   Alcohol/week: 28.0 standard drinks of alcohol   Types: 28 Cans of beer per week     Allergies  Allergen Reactions   Bee Venom Shortness Of Breath and Swelling   Cefdinir Other (See Comments)    Patient states he couldn't really breath    Spironolactone Anaphylaxis   Bactrim [Sulfamethoxazole-Trimethoprim] Other (See Comments)    Mouth burning   Daliresp [Roflumilast] Other (See Comments)    Dizzy, headache, leg pain per patient. 02/25/22. Tolerates in low doses    Jardiance [Empagliflozin] Nausea Only and Other (See Comments)     Lightheadness Dizziness   Penicillins Swelling and Other (See Comments)    Childhood rxn--MD stated he "almost died" Has patient had a PCN reaction causing immediate rash, facial/tongue/throat swelling, SOB or lightheadedness with hypotension:Yes Has patient had a PCN reaction causing severe rash involving mucus membranes or skin necrosis:unsure Has patient had a PCN reaction that required hospitalization:unsure Has patient had a PCN reaction occurring within the last 10 years:No If all of the above answers are "NO", then may proceed with Cephalosporin use.     Clindamycin Other (See Comments)    Tongue swelling   Doxycycline Nausea And Vomiting    Severe stomach upset per patient   E-Mycin [Erythromycin Base] Swelling   Rosuvastatin Other (See Comments)    Myalgias (intolerance)    Current Outpatient Medications  Medication Sig Dispense Refill   albuterol (PROVENTIL) (2.5 MG/3ML) 0.083% nebulizer solution TAKE 3 MLS BY NEBULIZATION EVERY 4 HOURS AS NEEDED FOR WHEEZING OR SHORTNESS OF BREATH 150 mL 11   allopurinol (ZYLOPRIM) 300 MG tablet Take 1 tablet (300 mg total) by mouth daily. 90 tablet 0   ALPRAZolam (XANAX) 0.5 MG tablet TAKE 1 TABLET(0.5 MG) BY MOUTH THREE TIMES DAILY 270 tablet 0   azelastine (ASTELIN) 0.1 % nasal spray Place 2 sprays into both nostrils 2 (two) times daily. Use in each nostril as directed 30 mL 1   baclofen (LIORESAL) 10 MG tablet Take 1 tablet (10 mg total) by mouth 3 (three) times daily. 30 each 0   DALIRESP 500 MCG TABS tablet TAKE 1 TABLET(500 MCG) BY MOUTH DAILY 30 tablet 11   dextromethorphan 15 MG/5ML syrup Take 10 mLs (30 mg total) by mouth 4 (four) times daily as needed for cough. 240 mL 0   diclofenac Sodium (VOLTAREN) 1 % GEL Apply 1 Application topically in the morning and at bedtime.     EPINEPHrine 0.3 mg/0.3 mL IJ SOAJ injection Inject 0.3 mg into the muscle as needed for anaphylaxis. 1 each 0   fluticasone (FLONASE) 50 MCG/ACT nasal spray  SHAKE LIQUID AND USE 2 SPRAYS IN EACH NOSTRIL DAILY (Patient taking differently: Place 1 spray into both nostrils daily as needed for allergies.) 16 g 2   gabapentin (NEURONTIN) 300 MG capsule Take 1 capsule (300 mg total) by mouth 3 (three) times daily. 90 capsule 4   guaiFENesin (MUCINEX) 600 MG 12 hr tablet Take 1 tablet (600 mg total) by mouth 2 (two) times daily. 60 tablet 5   ipratropium (ATROVENT) 0.06 % nasal spray Place 2 sprays into both nostrils 4 (four)  times daily as needed for rhinitis. (Patient taking differently: Place 2 sprays into both nostrils 4 (four) times daily as needed for rhinitis. As needed) 45 mL 3   ipratropium-albuterol (DUONEB) 0.5-2.5 (3) MG/3ML SOLN Take 3 mLs by nebulization every 4 (four) hours as needed. 360 mL 5   loratadine (CLARITIN) 10 MG tablet Take 10 mg by mouth every other day. As needed     methocarbamol (ROBAXIN) 500 MG tablet Take 1 tablet (500 mg total) by mouth every 8 (eight) hours as needed for muscle spasms. 30 tablet 1   metolazone (ZAROXOLYN) 2.5 MG tablet Take 1 tablet (2.5 mg total) by mouth 2 (two) times a week. Take 2.5 mg on Monday/Friday 8 tablet 11   mometasone-formoterol (DULERA) 100-5 MCG/ACT AERO INHALE 2 PUFFS INTO THE LUNGS TWICE DAILY 13 g 5   montelukast (SINGULAIR) 10 MG tablet TAKE 1 TABLET BY MOUTH EVERY DAY AT BEDTIME 30 tablet 5   omeprazole (PRILOSEC) 20 MG capsule TAKE 1 CAPSULE(20 MG) BY MOUTH TWICE DAILY BEFORE A MEAL FOR STOMACH ACID OR REFLUX 180 capsule 0   ondansetron (ZOFRAN-ODT) 4 MG disintegrating tablet Take 1 tablet (4 mg total) by mouth every 8 (eight) hours as needed for nausea or vomiting. 20 tablet 0   potassium chloride (KLOR-CON) 20 MEQ packet Take 60 mEq by mouth every morning AND 40 mEq every evening. 150 packet 5   predniSONE (DELTASONE) 10 MG tablet Take 1 tablet (10 mg total) by mouth daily with breakfast. 90 tablet 1   Testosterone 20.25 MG/1.25GM (1.62%) GEL Place 1.25 g onto the skin daily. 112.5 g 0    Tiotropium Bromide Monohydrate (SPIRIVA RESPIMAT) 2.5 MCG/ACT AERS Inhale 2 puffs into the lungs daily. 4 g 6   torsemide (DEMADEX) 20 MG tablet Take 2 tablets (40 mg total) by mouth daily. 90 tablet 11   VENTOLIN HFA 108 (90 Base) MCG/ACT inhaler INHALE 2 PUFFS INTO THE LUNGS EVERY 6 HOURS AS NEEDED FOR WHEEZING 18 g 4   vitamin B-12 (CYANOCOBALAMIN) 1000 MCG tablet Take 1,000 mcg by mouth daily.     vitamin E 180 MG (400 UNITS) capsule Take 400 Units by mouth daily.     Vitamin D, Ergocalciferol, (DRISDOL) 1.25 MG (50000 UNIT) CAPS capsule Take 1 capsule (50,000 Units total) by mouth every 7 (seven) days. 8 capsule 0   No current facility-administered medications for this visit.    Review of Systems  Constitutional:  Negative for malaise/fatigue.  Respiratory:  Negative for cough and shortness of breath.   Cardiovascular:  Negative for chest pain.  Neurological: Negative.     PHYSICAL EXAMINATION: BP 118/80 (BP Location: Left Arm, Patient Position: Sitting, Cuff Size: Large)   Pulse 89   Resp 20   Ht 5\' 10"  (1.778 m)   Wt 229 lb (103.9 kg)   SpO2 94% Comment: RA  BMI 32.86 kg/m   Physical Exam Constitutional:      General: He is not in acute distress.    Appearance: He is not ill-appearing.  HENT:     Head: Normocephalic and atraumatic.  Eyes:     Extraocular Movements: Extraocular movements intact.  Cardiovascular:     Rate and Rhythm: Normal rate.  Pulmonary:     Effort: Pulmonary effort is normal. No respiratory distress.  Chest:    Abdominal:     General: There is no distension.  Skin:    General: Skin is warm and dry.  Neurological:     General: No focal  deficit present.     Mental Status: He is alert and oriented to person, place, and time.      Diagnostic Studies & Laboratory data:     Recent Radiology Findings:   [place] LT UPPER EXTREM LTD SOFT TISSUE NON VASCULAR Result Date: 02/29/2024 CLINICAL DATA:  Left forearm lump EXAM: ULTRASOUND LEFT UPPER  EXTREMITY LIMITED TECHNIQUE: Ultrasound examination of the upper extremity soft tissues was performed in the area of clinical concern. COMPARISON:  X-ray 07/19/2023 FINDINGS: Targeted ultrasound was performed of the soft tissues of the left upper extremity at site of patient's clinical concern. Focally edematous subcutaneous soft tissue at this location. No organized fluid collection. No well-defined solid or cystic mass lesion. IMPRESSION: Focally edematous subcutaneous soft tissue at site of patient's clinical concern. No organized fluid collection or well-defined solid or cystic mass lesion. If symptoms do not resolve clinically, consider follow-up with MRI. Electronically Signed   By: Duanne Guess D.O.   On: 02/29/2024 09:02   CUP PACEART REMOTE DEVICE CHECK Result Date: 02/17/2024 Scheduled remote reviewed. Normal device function.  Next remote 91 days. LA, CVRS  CT CHEST WO CONTRAST Result Date: 02/11/2024 CLINICAL DATA:  Left upper quadrant abdominal pain, history of rib fractures after coughing several weeks ago, recent RSV, left-sided chest wall and abdominal wall bruising EXAM: CT CHEST AND ABDOMEN WITHOUT CONTRAST TECHNIQUE: Multidetector CT imaging of the chest and abdomen was performed following the standard protocol without intravenous contrast. RADIATION DOSE REDUCTION: This exam was performed according to the departmental dose-optimization program which includes automated exposure control, adjustment of the mA and/or kV according to patient size and/or use of iterative reconstruction technique. COMPARISON:  01/07/2024, 09/12/2020 FINDINGS: CT CHEST FINDINGS WITHOUT CONTRAST Cardiovascular: Unenhanced imaging of the heart demonstrates mild cardiomegaly without pericardial effusion. There is a multi lead cardiac pacer, proximal lead coiled in the right atrium and 2 distal leads extending into the right ventricle. Normal caliber of the thoracic aorta. Atherosclerosis of the aorta and coronary  vasculature. Mediastinum/Nodes: No enlarged mediastinal or axillary lymph nodes. Thyroid gland, trachea, and esophagus demonstrate no significant findings. Lungs/Pleura: There is an intercostal lung hernia at the left posterolateral eighth intercostal space, with a portion of the left lower lobe and lingula extending through the intercostal defect. There is a small left pleural effusion, with minimal left lower lobe atelectasis is noted. No acute airspace disease. No pneumothorax. Mild emphysema. Musculoskeletal: There is a healing left posterior ninth rib fracture at the costovertebral junction, with mild callus formation. There is a displaced fracture of the costal cartilage of the left anterior ninth rib, likely accounting for the intercostal hernia described above. There is edema within the overlying musculature at the left anterior ninth rib consistent with intramuscular hematoma. There is evidence of a chronic healed left anterior seventh rib fracture. No other acute bony abnormalities. CT ABDOMEN FINDINGS WITHOUT CONTRAST Hepatobiliary: Unremarkable unenhanced appearance of the liver and gallbladder. Pancreas: Unremarkable unenhanced appearance. Spleen: Unremarkable unenhanced appearance. Adrenals/Urinary Tract: No urinary tract calculi or obstructive uropathy. The adrenals are stable. Stomach/Bowel: No bowel obstruction or ileus. Normal appendix right lower quadrant. No bowel wall thickening or inflammatory change. Vascular/Lymphatic: Aortic atherosclerosis. No pathologic adenopathy. Other: No free fluid or free intraperitoneal gas. No abdominal wall hernia. Musculoskeletal: No acute displaced fracture. Reconstructed images demonstrate no additional findings. IMPRESSION: 1. Segmental left ninth rib fracture, with a healing fracture at the costovertebral junction and a displaced fracture through the costal cartilage. There is a resulting intercostal lung hernia  in the 8th intercostal space, with portions of  the left lower lobe and lingula protruding through the intercostal defect. 2. Intramuscular edema within the left lateral chest wall overlying the left ninth and tenth ribs, consistent with sequela of previous trauma. 3. Small left pleural effusion, with minimal left lower lobe atelectasis. 4. Cardiomegaly. 5. Aortic Atherosclerosis (ICD10-I70.0). Coronary artery atherosclerosis. These results will be called to the ordering clinician or representative by the Radiologist Assistant, and communication documented in the PACS or [employer]. Electronically Signed   By: Sharlet Salina M.D.   On: 02/11/2024 16:51   CT ABDOMEN WO CONTRAST Result Date: 02/11/2024 CLINICAL DATA:  Left upper quadrant abdominal pain, history of rib fractures after coughing several weeks ago, recent RSV, left-sided chest wall and abdominal wall bruising EXAM: CT CHEST AND ABDOMEN WITHOUT CONTRAST TECHNIQUE: Multidetector CT imaging of the chest and abdomen was performed following the standard protocol without intravenous contrast. RADIATION DOSE REDUCTION: This exam was performed according to the departmental dose-optimization program which includes automated exposure control, adjustment of the mA and/or kV according to patient size and/or use of iterative reconstruction technique. COMPARISON:  01/07/2024, 09/12/2020 FINDINGS: CT CHEST FINDINGS WITHOUT CONTRAST Cardiovascular: Unenhanced imaging of the heart demonstrates mild cardiomegaly without pericardial effusion. There is a multi lead cardiac pacer, proximal lead coiled in the right atrium and 2 distal leads extending into the right ventricle. Normal caliber of the thoracic aorta. Atherosclerosis of the aorta and coronary vasculature. Mediastinum/Nodes: No enlarged mediastinal or axillary lymph nodes. Thyroid gland, trachea, and esophagus demonstrate no significant findings. Lungs/Pleura: There is an intercostal lung hernia at the left posterolateral eighth intercostal space, with a  portion of the left lower lobe and lingula extending through the intercostal defect. There is a small left pleural effusion, with minimal left lower lobe atelectasis is noted. No acute airspace disease. No pneumothorax. Mild emphysema. Musculoskeletal: There is a healing left posterior ninth rib fracture at the costovertebral junction, with mild callus formation. There is a displaced fracture of the costal cartilage of the left anterior ninth rib, likely accounting for the intercostal hernia described above. There is edema within the overlying musculature at the left anterior ninth rib consistent with intramuscular hematoma. There is evidence of a chronic healed left anterior seventh rib fracture. No other acute bony abnormalities. CT ABDOMEN FINDINGS WITHOUT CONTRAST Hepatobiliary: Unremarkable unenhanced appearance of the liver and gallbladder. Pancreas: Unremarkable unenhanced appearance. Spleen: Unremarkable unenhanced appearance. Adrenals/Urinary Tract: No urinary tract calculi or obstructive uropathy. The adrenals are stable. Stomach/Bowel: No bowel obstruction or ileus. Normal appendix right lower quadrant. No bowel wall thickening or inflammatory change. Vascular/Lymphatic: Aortic atherosclerosis. No pathologic adenopathy. Other: No free fluid or free intraperitoneal gas. No abdominal wall hernia. Musculoskeletal: No acute displaced fracture. Reconstructed images demonstrate no additional findings. IMPRESSION: 1. Segmental left ninth rib fracture, with a healing fracture at the costovertebral junction and a displaced fracture through the costal cartilage. There is a resulting intercostal lung hernia in the 8th intercostal space, with portions of the left lower lobe and lingula protruding through the intercostal defect. 2. Intramuscular edema within the left lateral chest wall overlying the left ninth and tenth ribs, consistent with sequela of previous trauma. 3. Small left pleural effusion, with minimal left  lower lobe atelectasis. 4. Cardiomegaly. 5. Aortic Atherosclerosis (ICD10-I70.0). Coronary artery atherosclerosis. These results will be called to the ordering clinician or representative by the Radiologist Assistant, and communication documented in the PACS or [employer]. Electronically Signed   By:  Sharlet Salina M.D.   On: 02/11/2024 16:51       I have independently reviewed the above radiology studies  and reviewed the findings with the patient.   Recent Lab Findings: Lab Results  Component Value Date   WBC 8.4 01/07/2024   HGB 14.1 01/07/2024   HCT 42.0 01/07/2024   PLT 279.0 01/07/2024   GLUCOSE 119 (H) 03/02/2024   CHOL 221 (H) 03/17/2023   TRIG 196.0 (H) 03/17/2023   HDL 45.50 03/17/2023   LDLDIRECT 157.0 09/15/2022   LDLCALC 136 (H) 03/17/2023   ALT 14 03/02/2024   AST 17 03/02/2024   NA 141 03/02/2024   K 3.8 03/02/2024   CL 97 03/02/2024   CREATININE 0.91 03/02/2024   BUN 11 03/02/2024   CO2 34 (H) 03/02/2024   TSH 0.39 03/17/2023   INR 1.0 11/09/2023   HGBA1C 6.7 (H) 11/09/2023       Assessment / Plan:   68 year old male with a left anterior lateral chest wall hernia.  This is not the source of his pain or symptoms.  There is an obvious defect on physical exam and the CT scan his back pain is upper thoracic spine close to the hernia.  Given that the hernia is not causing him any severe symptoms of pain of recommended that we continue observation for now.  Patient states that he does not want to undergo any surgeries as well.  Continue to follow-up as needed.      Corliss Skains 03/07/2024 8:41 AM

## 2024-03-09 ENCOUNTER — Encounter: Payer: Self-pay | Admitting: Physical Therapy

## 2024-03-09 ENCOUNTER — Ambulatory Visit: Admitting: Physical Therapy

## 2024-03-09 DIAGNOSIS — R2681 Unsteadiness on feet: Secondary | ICD-10-CM

## 2024-03-09 DIAGNOSIS — R0902 Hypoxemia: Secondary | ICD-10-CM | POA: Diagnosis not present

## 2024-03-09 DIAGNOSIS — G4730 Sleep apnea, unspecified: Secondary | ICD-10-CM | POA: Diagnosis not present

## 2024-03-09 NOTE — Therapy (Signed)
 OUTPATIENT PHYSICAL THERAPY NEURO EVALUATION- ARRIVED NO CHARGE    Patient Name: Robert Church MRN: [id number] DOB:1956/02/17, 68 y.o., male Today's Date: 03/09/2024   PCP: Robert Grandchild, MD REFERRING PROVIDER: Moshe Cipro, FNP  END OF SESSION:  PT End of Session - 03/09/24 1534     Visit Number 1    Authorization Type BCBS    PT Start Time 1532    PT Stop Time 1552    PT Time Calculation (min) 20 min    Activity Tolerance Patient tolerated treatment well             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   Requires supplemental oxygen    Past Surgical History:  Procedure Laterality Date   BIV UPGRADE N/A 10/07/2021   Procedure: BIV PPM UPGRADE;  Surgeon: Regan Lemming, MD;  Location: [facility];  Service: Cardiovascular;  Laterality: N/A;   BIV UPGRADE N/A 11/11/2022   Procedure: BIV PPM UPGRADE;  Surgeon: Regan Lemming, MD;  Location: [facility];  Service: Cardiovascular;  Laterality: N/A;   LEFT HEART CATH AND CORONARY ANGIOGRAPHY N/A 04/19/2017   Procedure: Left Heart Cath and Coronary Angiography;  Surgeon: Lyn Records, MD;  Location: [facility] IN[facility] CV [facility];  Service: Cardiovascular;  Laterality: N/A;   PACEMAKER IMPLANT N/A 04/16/2021   Procedure: PACEMAKER IMPLANT;  Surgeon: Regan Lemming, MD;  Location: [facility];  Service: Cardiovascular;  Laterality: N/A;   RIGHT HEART CATH N/A 03/09/2023   Procedure: RIGHT HEART CATH;  Surgeon: Dolores Patty, MD;   Location: [facility];  Service: Cardiovascular;  Laterality: N/A;   RIGHT/LEFT HEART CATH AND CORONARY ANGIOGRAPHY N/A 08/22/2021   Procedure: RIGHT/LEFT HEART CATH AND CORONARY ANGIOGRAPHY;  Surgeon: Dolores Patty, MD;  Location: [facility];  Service: Cardiovascular;  Laterality: N/A;   TONSILLECTOMY     Patient Active Problem List   Diagnosis Date Noted   Herniation of left lung 02/29/2024   Gait instability 02/09/2024   Generalized muscle weakness 02/09/2024   Left upper quadrant abdominal mass 02/09/2024   Chronic pain of both lower extremities 02/09/2024   Olecranon bursitis of right elbow 02/09/2024   Chronic tophaceous gout 11/10/2023   Diabetes mellitus (HCC) 11/10/2023   Screening for colon cancer 11/08/2023   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 11/08/2023   Obesity (BMI 30-39.9) 06/03/2023   Alcoholic cirrhosis of liver without ascites (HCC) 03/19/2023   Bilateral plantar fasciitis 01/04/2023   Cervical radiculopathy 01/03/2023   Bilateral primary osteoarthritis of knee 06/30/2022   Prostate cancer screening 06/29/2022   Chronic respiratory failure with hypoxia (HCC) 11/24/2021   Encounter for general adult medical examination with abnormal findings 08/20/2021   GAD (generalized anxiety disorder) 07/04/2021   Degenerative disc disease, cervical 04/21/2021   Carotid artery calcification 04/09/2021   Panlobular  emphysema (HCC) 03/25/2021   Atherosclerosis of aorta (HCC) 03/25/2021   Body mass index (BMI) 32.0-32.9, adult 01/15/2021   Lumbar spondylosis 01/10/2021   Foraminal stenosis of cervical region 01/10/2021   Chronic bilateral low back pain without sciatica 01/09/2021   Type II diabetes mellitus with manifestations (HCC) 11/29/2020   Primary osteoarthritis of both knees 09/24/2020   Idiopathic chronic gout of foot without tophus 08/08/2020   Yellow jacket sting allergy 08/08/2020   Hyperlipidemia LDL goal <70 07/11/2020    Hypertriglyceridemia 07/08/2020   Chronic combined systolic and diastolic CHF (congestive heart failure) (HCC) 01/09/2020   Solitary pulmonary nodule 08/15/2019   NICM (nonischemic cardiomyopathy) (HCC) 12/16/2018   Cigarette smoker 01/16/2015   COPD (chronic obstructive pulmonary disease) (HCC) 06/15/2014   Obstructive sleep apnea syndrome 01/05/2013   Asbestos exposure 01/05/2013   Seasonal allergic rhinitis 10/06/2012   GERD (gastroesophageal reflux disease) 06/05/2012   Intrinsic asthma 07/23/2011   HTN (hypertension) 07/23/2011    ONSET DATE: 03/07/2024 (referral)   REFERRING DIAG: J96.11 (ICD-10-CM) - Chronic respiratory failure with hypoxia M47.816 (ICD-10-CM) - Spondylosis without myelopathy or radiculopathy, lumbar region M17.0 (ICD-10-CM) - Bilateral primary osteoarthritis of knee R26.81 (ICD-10-CM) - Unsteadiness on feet M62.81 (ICD-10-CM) - Muscle weakness (generalized)  THERAPY DIAG:  Unsteadiness on feet  Rationale for Evaluation and Treatment: Rehabilitation  SUBJECTIVE:                                                                                                                                                                                             SUBJECTIVE STATEMENT: Pt presents in manual WC. "I need to get a mobility scooter". Pt states he is not interested in PT as he has "tried everything" and is not a functional ambulator due to pain and complex medical history. Pt reports he thought today was an eval to obtain a power scooter. Informed pt that insurance will not cover a scooter, but we could schedule him for a power WC eval as he does have an order for this. Provided pt w/information on obtaining scooter if he decides to, but given complexity of pt's medical history, recommend a power WC. Pt in agreement and is scheduled for WC eval on 4/8 at 1:15pm. Pt declining PT at this time as he only wants a power chair and cannot tolerate PT due to pain.     Pt  accompanied by:  [name] (neighbor)     Robert Church, PT, DPT 03/09/2024, 3:54 PM

## 2024-03-10 ENCOUNTER — Telehealth: Admitting: Nurse Practitioner

## 2024-03-10 ENCOUNTER — Ambulatory Visit: Payer: Self-pay | Admitting: Emergency Medicine

## 2024-03-10 ENCOUNTER — Encounter: Payer: Self-pay | Admitting: Nurse Practitioner

## 2024-03-10 DIAGNOSIS — J209 Acute bronchitis, unspecified: Secondary | ICD-10-CM

## 2024-03-10 DIAGNOSIS — J441 Chronic obstructive pulmonary disease with (acute) exacerbation: Secondary | ICD-10-CM

## 2024-03-10 DIAGNOSIS — J302 Other seasonal allergic rhinitis: Secondary | ICD-10-CM

## 2024-03-10 DIAGNOSIS — J9611 Chronic respiratory failure with hypoxia: Secondary | ICD-10-CM | POA: Diagnosis not present

## 2024-03-10 MED ORDER — DOXYCYCLINE HYCLATE 100 MG PO TABS: 100.0000 mg | ORAL_TABLET | Freq: Two times a day (BID) | ORAL | 0 refills | Status: DC

## 2024-03-10 NOTE — Progress Notes (Signed)
 Patient ID: Robert Church, male     DOB: February 27, 1956, 68 y.o.      MRN: [id number]  No chief complaint on file.   Virtual Visit via Video Note  I connected with Robert Church on 03/10/24 at  3:00 PM EDT by a video enabled telemedicine application and verified that I am speaking with the correct person using two identifiers.  Location: Patient: Home Provider: Office   I discussed the limitations of evaluation and management by telemedicine and the availability of in person appointments. The patient expressed understanding and agreed to proceed.  History of Present Illness: 68 year old male, former smoker followed for very severe COPD, chronic bronchitic phenotype, chronic rhinitis, and chronic respiratory failure on Trilogy vent and 4 lpm supplemental O2 at night. He is a patient of Dr. Kavin Leech and last seen 02/23/2024 via virtual visit. 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    TEST/EVENTS:  08/10/2011 PFT: FVC 86, FEV1 85, ratio 73, TLC 105, DLCO 96.  Positive BD 12/20/2015 spirometry: FVC 75, FEV1 76 02/11/2024 CT chest: Mild cardiomegaly.  Pacer in place.  Atherosclerosis.  No LAD.  Intercostal lung hernia at the left posterolateral eighth intercostal space.  Left lower lobe and lingula extending through the intercostal defect.  Small left pleural effusion, minimal left lower lobe atelectasis noted.  Mild emphysema.  Healing left posterior ninth rib fracture at the costovertebral junction, mild callus formation.  Displaced fracture of the costal cartilage of the left anterior ninth rib, likely accounting for the hernia.  Edema within the overlying musculature at the left anterior ninth rib consistent with intramuscular hematoma.  02/23/2024: Virtual visit with Dr. Delton Coombes.  Most recently had a flare due to RSV pneumonia that resulted in a lot of cough, rib fracture and resultant displacement and left lung herniation  through intercostal space.  Also with left pleural effusion.  Still coughing a lot.  Feels some movement of the left rib margin.  Clear mucus.  He is on prednisone 10 mg.  Still using nocturnal vent at night and sometimes during the day with nap.  Off exertional O2.  Waiting to see thoracic surgery to see if there is any intervention for the displaced rib fracture.  Would be high risk for surgery and there may be no role unless he is high risk for pneumothorax.  Advised to add back on guaifenesin.  03/10/2024: Today-acute Discussed the use of AI scribe software for clinical note transcription with the patient, who gave verbal consent to proceed.  History of Present Illness   Robert Church "Robert Church" is a 68 year old male with COPD who presents with increased productive cough.   He has experienced an increase in productive cough with expectoration of white and yellow phlegm. No fever, chills, or hemoptysis are present. His breathing has not worsened since his last visit a few weeks ago, although he experiences some more wheezing. He uses a nebulizer, which sometimes helps alleviate his symptoms.  He experiences shortness of breath, particularly in the mornings, which improves as the day progresses. He is unable to walk through the grocery store and requires assistance with groceries, which has been his new baseline since he was sick earlier this year with RSV pna. He monitors his oxygen levels with a pulse oximeter, which typically reads around 91% on room air.   He is currently on several medications for his COPD, including Dulera, Spiriva, Daliresp,  Singulair, nasal sprays, Claritin, and prednisone at a dose of 10 mg daily. He also has doxycycline available at home, which he has tolerated well in the past.  He is having some increased sinus congestion and drainage with the change in season and onset of allergy season.  Has been on Claritin for a long time.  Does have saline rinses available to him at home  but not currently using them.  Is using all his nasal sprays.  He recently underwent an overnight oxygen study to assess his need for supplemental oxygen at night, using NIV and supplemental oxygen 2 L/min.   He did have evaluation with cardiothoracic surgery.  They reviewed options.  Felt like he would be okay to manage conservatively.        Allergies  Allergen Reactions   Bee Venom Shortness Of Breath and Swelling   Cefdinir Other (See Comments)    Patient states he couldn't really breath    Spironolactone Anaphylaxis   Bactrim [Sulfamethoxazole-Trimethoprim] Other (See Comments)    Mouth burning   Daliresp [Roflumilast] Other (See Comments)    Dizzy, headache, leg pain per patient. 02/25/22. Tolerates in low doses    Jardiance [Empagliflozin] Nausea Only and Other (See Comments)    Lightheadness Dizziness   Penicillins Swelling and Other (See Comments)    Childhood rxn--MD stated he "almost died" Has patient had a PCN reaction causing immediate rash, facial/tongue/throat swelling, SOB or lightheadedness with hypotension:Yes Has patient had a PCN reaction causing severe rash involving mucus membranes or skin necrosis:unsure Has patient had a PCN reaction that required hospitalization:unsure Has patient had a PCN reaction occurring within the last 10 years:No If all of the above answers are "NO", then may proceed with Cephalosporin use.     Clindamycin Other (See Comments)    Tongue swelling   Doxycycline Nausea And Vomiting    Severe stomach upset per patient   E-Mycin [Erythromycin Base] Swelling   Rosuvastatin Other (See Comments)    Myalgias (intolerance)   Immunization History  Administered Date(s) Administered   Influenza,inj,Quad PF,6+ Mos 09/17/2021   PNEUMOCOCCAL CONJUGATE-20 08/20/2021   Tdap 12/03/2011   Past Medical History:  Diagnosis Date   Adenomatous colon polyp    Allergy    Anal fissure    Arthritis    Asthma    Bronchitis    CHF (congestive  heart failure) (HCC)    COPD, group D, by GOLD 2017 classification (HCC)    Emphysema lung (HCC)    GERD (gastroesophageal reflux disease)    History of hiatal hernia    Hyperlipidemia    Hypertension    NICM (nonischemic cardiomyopathy) (HCC)    OSA (obstructive sleep apnea)    Pneumonia    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   Requires supplemental oxygen     Tobacco History: Social History   Tobacco Use  Smoking Status Former   Current packs/day: 0.00   Average packs/day: 2.0 packs/day for 46.0 years (92.0 ttl pk-yrs)   Types: Cigarettes   Start date: 01/28/1976   Quit date: 01/27/2022   Years since quitting: 2.1   Passive exposure: Past  Smokeless Tobacco Never  Tobacco Comments   Chews on a cigar.  09/16/2023 hfb      Smokes one occasionally per pt. 03/03/24    Counseling given: Not Answered Tobacco comments: Chews on a cigar.  09/16/2023 hfb  Smokes one occasionally per pt. 03/03/24    Outpatient Medications Prior to Visit  Medication Sig Dispense Refill   albuterol (PROVENTIL) (2.5 MG/3ML) 0.083% nebulizer solution TAKE 3 MLS BY NEBULIZATION EVERY 4 HOURS AS NEEDED FOR WHEEZING OR SHORTNESS OF BREATH 150 mL 11   allopurinol (ZYLOPRIM) 300 MG tablet Take 1 tablet (300 mg total) by mouth daily. 90 tablet 0   ALPRAZolam (XANAX) 0.5 MG tablet TAKE 1 TABLET(0.5 MG) BY MOUTH THREE TIMES DAILY 270 tablet 0   azelastine (ASTELIN) 0.1 % nasal spray Place 2 sprays into both nostrils 2 (two) times daily. Use in each nostril as directed 30 mL 1   baclofen (LIORESAL) 10 MG tablet Take 1 tablet (10 mg total) by mouth 3 (three) times daily. 30 each 0   DALIRESP 500 MCG TABS tablet TAKE 1 TABLET(500 MCG) BY MOUTH DAILY 30 tablet 11   dextromethorphan 15 MG/5ML syrup Take 10 mLs (30 mg total) by mouth 4 (four) times daily as needed for cough. 240 mL 0   diclofenac Sodium (VOLTAREN) 1 % GEL Apply 1 Application topically in the morning and at bedtime.     EPINEPHrine 0.3  mg/0.3 mL IJ SOAJ injection Inject 0.3 mg into the muscle as needed for anaphylaxis. 1 each 0   fluticasone (FLONASE) 50 MCG/ACT nasal spray SHAKE LIQUID AND USE 2 SPRAYS IN EACH NOSTRIL DAILY (Patient taking differently: Place 1 spray into both nostrils daily as needed for allergies.) 16 g 2   gabapentin (NEURONTIN) 300 MG capsule Take 1 capsule (300 mg total) by mouth 3 (three) times daily. 90 capsule 4   guaiFENesin (MUCINEX) 600 MG 12 hr tablet Take 1 tablet (600 mg total) by mouth 2 (two) times daily. 60 tablet 5   ipratropium (ATROVENT) 0.06 % nasal spray Place 2 sprays into both nostrils 4 (four) times daily as needed for rhinitis. (Patient taking differently: Place 2 sprays into both nostrils 4 (four) times daily as needed for rhinitis. As needed) 45 mL 3   ipratropium-albuterol (DUONEB) 0.5-2.5 (3) MG/3ML SOLN Take 3 mLs by nebulization every 4 (four) hours as needed. 360 mL 5   loratadine (CLARITIN) 10 MG tablet Take 10 mg by mouth every other day. As needed     methocarbamol (ROBAXIN) 500 MG tablet Take 1 tablet (500 mg total) by mouth every 8 (eight) hours as needed for muscle spasms. 30 tablet 1   metolazone (ZAROXOLYN) 2.5 MG tablet Take 1 tablet (2.5 mg total) by mouth 2 (two) times a week. Take 2.5 mg on Monday/Friday 8 tablet 11   mometasone-formoterol (DULERA) 100-5 MCG/ACT AERO INHALE 2 PUFFS INTO THE LUNGS TWICE DAILY 13 g 5   montelukast (SINGULAIR) 10 MG tablet TAKE 1 TABLET BY MOUTH EVERY DAY AT BEDTIME 30 tablet 5   omeprazole (PRILOSEC) 20 MG capsule TAKE 1 CAPSULE(20 MG) BY MOUTH TWICE DAILY BEFORE A MEAL FOR STOMACH ACID OR REFLUX 180 capsule 0   ondansetron (ZOFRAN-ODT) 4 MG disintegrating tablet Take 1 tablet (4 mg total) by mouth every 8 (eight) hours as needed for nausea or vomiting. 20 tablet 0   potassium chloride (KLOR-CON) 20 MEQ packet Take 60 mEq by mouth every morning AND 40 mEq every evening. 150 packet 5   predniSONE (DELTASONE) 10 MG tablet Take 1 tablet (10 mg  total) by mouth daily with breakfast. 90 tablet 1   Testosterone 20.25 MG/1.25GM (1.62%) GEL Place 1.25 g onto the skin daily. 112.5 g 0   Tiotropium Bromide Monohydrate (SPIRIVA RESPIMAT) 2.5 MCG/ACT AERS Inhale 2 puffs into the lungs daily. 4 g 6  torsemide (DEMADEX) 20 MG tablet Take 2 tablets (40 mg total) by mouth daily. 90 tablet 11   VENTOLIN HFA 108 (90 Base) MCG/ACT inhaler INHALE 2 PUFFS INTO THE LUNGS EVERY 6 HOURS AS NEEDED FOR WHEEZING 18 g 4   vitamin B-12 (CYANOCOBALAMIN) 1000 MCG tablet Take 1,000 mcg by mouth daily.     Vitamin D, Ergocalciferol, (DRISDOL) 1.25 MG (50000 UNIT) CAPS capsule Take 1 capsule (50,000 Units total) by mouth every 7 (seven) days. 8 capsule 0   vitamin E 180 MG (400 UNITS) capsule Take 400 Units by mouth daily.     No facility-administered medications prior to visit.     Review of Systems:   Constitutional: No weight loss or gain, night sweats, fevers, chills, or lassitude. +fatigue (baseline) HEENT: No headaches, difficulty swallowing, tooth/dental problems, or sore throat. No sneezing, itching, ear ache + nasal congestion, post nasal drip CV:  No chest pain, orthopnea, PND, swelling in lower extremities, anasarca, dizziness, palpitations, syncope Resp: +shortness of breath with exertion; productive cough; wheezing.  No hemoptysis. No chest wall deformity GI:  No heartburn, indigestion, abdominal pain, nausea, vomiting, diarrhea,  Neuro: No dizziness or lightheadedness.  Psych: No depression or anxiety. Mood stable.   Observations/Objective: Patient is well-developed, well-nourished in no acute distress. Resting comfortably at home.  No labored breathing.  Speech is clear and coherent with logical content.  Patient is alert and oriented at baseline. No coughing during exam  Assessment and Plan: No problem-specific Assessment & Plan notes found for this encounter. Assessment and Plan    Chronic Obstructive Pulmonary Disease (COPD)  exacerbation Experiencing an exacerbation with increased productive cough and white and yellow sputum. Possibly allergy induced. Breathing is stable. On aggressive maintenance regimen. Increase prednisone use and treat with empiric doxycycline. Aggressive bronchodilator and mucociliary clearance. Action plan in place. Strict ED precautions.  - Increase prednisone to 20 mg daily for 5 days, then return to 10 mg daily. - Prescribe doxycycline 100 mg twice daily for 7 days - Continue albuterol nebulizer 2-3 times daily as needed. - Use Mucinex twice daily to thin secretions. - Switch from Claritin to Zyrtec or Xyzal for allergy management. - Perform saline nasal rinses - Contact clinic if symptoms worsen or do not improve. - Continue aggressive maintenance regimen   Allergic rhinitis Symptoms likely exacerbated by seasonal pollen, potentially contributing to COPD exacerbation. Advised to switch allergy medication. Restart saline rinses. Continue nasal sprays  - Switch from Claritin to Zyrtec or Xyzal for better allergy control. - Perform saline nasal rinses   Rib fracture Conservative management  - Follow up with CT surgery as scheduled   Chronic respiratory failure  He is on NIV at night with supplemental O2 bled through. Completed ONO yesterday. Results not available. Will discuss at his upcoming follow up - Await results of the overnight oxygen study  - Continue monitoring oxygen saturation for goal >88-90% - Continue NIV with supplemental oxygen at night   Follow-up Attend follow-up appointment with Dr. Delton Coombes on April 10th to reassess the current treatment plan and evaluate the condition in person. - Attend follow-up appointment with Dr. Corliss Blacker on April 10th. - Contact the clinic if symptoms worsen or do not improve before the scheduled appointment.         I discussed the assessment and treatment plan with the patient. The patient was provided an opportunity to ask questions and  all were answered. The patient agreed with the plan and demonstrated an understanding of the  instructions.   The patient was advised to call back or seek an in-person evaluation if the symptoms worsen or if the condition fails to improve as anticipated.  I provided 35 minutes of non-face-to-face time during this encounter.   Noemi Chapel, NP

## 2024-03-10 NOTE — Telephone Encounter (Signed)
 Patient scheduled to see Dr. Delton Coombes on 4/10.  Nothing further needed.

## 2024-03-10 NOTE — Telephone Encounter (Signed)
 Chief Complaint: productive cough Symptoms: colored sputum Frequency: 3-4 days Pertinent Negatives: Patient denies fever, URI sx Disposition: [] ED /[] Urgent Care (no appt availability in office) / [x] Appointment(In office/virtual)/ []  [facility]/ [] Home Care/ [] Refused Recommended Disposition /[] [facility]/ [x]  Follow-up with PCP Additional Notes: Pt c/o productive cough x 2-3 days. Reports sputum is white/yellow. Denies any fever, SOB above baseline, URI sx. Reports taking all respiratory tx as prescribed, as well as ventolin. Pulmonology PAA [facility] notified of disposition, patient transferred for scheduling. Patient verbalized understanding and to call back with worsening symptoms.     Reason for Disposition  [1] Known COPD or other severe lung disease (i.e., bronchiectasis, cystic fibrosis, lung surgery) AND [2] worsening symptoms (i.e., increased sputum purulence or amount, increased breathing difficulty  Answer Assessment - Initial Assessment Questions E2C2 Pulmonary Triage - Initial Assessment Questions "Chief Complaint (e.g., cough, sob, wheezing, fever, chills, sweat or additional symptoms) *Go to specific symptom protocol after initial questions. Productive cough Reports RSV PNA last month  "How long have symptoms been present?" 3-4 days ago  Have you tested for COVID or Flu? Note: If not, ask patient if a home test can be taken. If so, instruct patient to call back for positive results. Yes, COVID/flu negative yesterday  MEDICINES:   "Have you used any OTC meds to help with symptoms?" Yes If yes, ask "What medications?" Mucinex with no relief  "Have you used your inhalers/maintenance medication?" Yes If yes, "What medications?" Dulera - 2 puffs twice daily Ventolin - q4-6 hours - has been using consistently Spiriva - 1 puff twice daily  If inhaler, ask "How many puffs and how often?" Note: Review instructions on medication in the chart. See  above  OXYGEN: "Do you wear supplemental oxygen?" Yes If yes, "How many liters are you supposed to use?" Ventilator with concentrator - 4L at night  "Do you monitor your oxygen levels?" Yes If yes, "What is your reading (oxygen level) today?" 91%  "What is your usual oxygen saturation reading?"  (Note: Pulmonary O2 sats should be 90% or greater) 90-93    3. SPUTUM: "Describe the color of your sputum" (none, dry cough; clear, white, yellow, green)     White, yellow 4. HEMOPTYSIS: "Are you coughing up any blood?" If so ask: "How much?" (flecks, streaks, tablespoons, etc.)     no 5. DIFFICULTY BREATHING: "Are you having difficulty breathing?" If Yes, ask: "How bad is it?" (e.g., mild, moderate, severe)    - MILD: No SOB at rest, mild SOB with walking, speaks normally in sentences, can lie down, no retractions, pulse < 100.    - MODERATE: SOB at rest, SOB with minimal exertion and prefers to sit, cannot lie down flat, speaks in phrases, mild retractions, audible wheezing, pulse 100-120.    - SEVERE: Very SOB at rest, speaks in single words, struggling to breathe, sitting hunched forward, retractions, pulse > 120      No, SOB at baseline - not any worse 6. FEVER: "Do you have a fever?" If Yes, ask: "What is your temperature, how was it measured, and when did it start?"     no 7. CARDIAC HISTORY: "Do you have any history of heart disease?" (e.g., heart attack, congestive heart failure)      CHF 8. LUNG HISTORY: "Do you have any history of lung disease?"  (e.g., pulmonary embolus, asthma, emphysema)     COPD, OSA 9. PE RISK FACTORS: "Do you have a history of blood clots?" (  or: recent major surgery, recent prolonged travel, bedridden)     no 10. OTHER SYMPTOMS: "Do you have any other symptoms?" (e.g., runny nose, wheezing, chest pain)       no  Protocols used: Cough - Acute Productive-A-AH

## 2024-03-10 NOTE — Patient Instructions (Addendum)
 Continue Albuterol inhaler 2 puffs or 3 mL neb every 6 hours as needed for shortness of breath or wheezing. Notify if symptoms persist despite rescue inhaler/neb use.  Continue Dulera 2 puffs Twice daily. Brush tongue and rinse mouth afterwards Continue Spiriva 2 puffs daily Continue azelastine 2 sprays each nostril Twice daily  Continue daliresp 500 mcg daily Continue flonase nasal spray 2 sprays each nostril daily Continue guaifenesin 600 mg Twice daily for congestion/cough Continue ipratropium 2 sprays four times a day as needed for congestion/drainage Continue singulair 1 tab At bedtime  Continue NIV with oxygen at night. Goal >88-90%  -Use saline nasal rinses 1-2 times a day with bottled distilled water and then follow with nasal sprays 20-30 minutes after rinse -Switch claritin to zyrtec or xyzal for allergies  -Increased prednisone to 20 mg for 5 days. If you feel like your breathing worsens or symptoms don't improve, then do a prednisone taper. 4 tabs for 2 days, then 3 tabs for 2 days, 2 tabs for 2 days, then return to 10 mg daily. Take in AM  -Doxycycline 1 tab Twice daily for 7 days. Take with food  Follow up with Dr. Delton Coombes 4/10 as scheduled. If symptoms do not improve or worsen, please contact office for sooner follow up or seek emergency care.

## 2024-03-13 DIAGNOSIS — J449 Chronic obstructive pulmonary disease, unspecified: Secondary | ICD-10-CM | POA: Diagnosis not present

## 2024-03-16 ENCOUNTER — Other Ambulatory Visit: Payer: Self-pay | Admitting: Internal Medicine

## 2024-03-16 DIAGNOSIS — F411 Generalized anxiety disorder: Secondary | ICD-10-CM

## 2024-03-17 ENCOUNTER — Telehealth: Payer: Self-pay

## 2024-03-17 NOTE — Telephone Encounter (Signed)
 Spoke with Robert Church regarding RB note. Pt states he was using 2.5L/min during test. RB informed me that he would like to keep the pt on 2.5L/min at home since that is what he used during the test. Pt stated he is currently using 3.5L/min and will make the switch. Pt verbalized understanding and had no concerns. NFN

## 2024-03-17 NOTE — Telephone Encounter (Signed)
 Copied from CRM [id number]. Topic: Clinical - Lab/Test Results >> Mar 17, 2024 11:00 AM Robert Church wrote: Reason for CRM: Patient is requesting a call back as he is awaiting results of his sleep study from last week. Please call patient back and advise.  Spoke with Jake Shark, he would like results from overnight sleep study 04/13/23. He is concerned if he can start sleeping with 2L oxygen at night.  I contacted [employer] to get results and they are faxing them.   Waiting to receive fax from [employer].

## 2024-03-17 NOTE — Telephone Encounter (Signed)
 We looked at his ONO this week. It looks like it was performed on nocturnal vent + 3L/min bled in. Need to confirm with pt whether this is correct. There were some low levels measured that looked like artifact. Suspect that whatever settings he was on at home during the test are adequate.

## 2024-03-20 ENCOUNTER — Encounter: Payer: Medicare Other | Admitting: Cardiology

## 2024-03-22 ENCOUNTER — Encounter: Payer: Self-pay | Admitting: Emergency Medicine

## 2024-03-22 NOTE — Progress Notes (Unsigned)
 Cardiology Office Note Date:  03/22/2024  Patient ID:  Robert, Church 02/07/1956, MRN [id number] PCP:  [name], MD  Cardiologist:  Dr. Allyson Sabal AHF: Dr. Shirlee Latch Electrophysiologist: Dr. Elberta Fortis    Chief Complaint: *** annual device visit  History of Present Illness: Robert Church is a 68 y.o. 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 Elberta Fortis last 03/05/23, generally tired, weak, following closely with HF team, was pending a cath Post upgrade DVT UE >> had been 3 mo and Eliquis was stopped  Last saw the HF team 02/04/24, recently had diuretics adjusted with good response, volume status improved this visit, + cough (also suspect virl illness)  Saw Dr. Allyson Sabal 02/07/24, described as extremely functionally limited No changes were made  TODAY  *** device only *** AHF next has a recall for May DB *** volume *** BP% *** AFib??  Device information [employer] dual chamber PPM implanted 04/16/21 Subsequent upgrade to CRT-P device 11/11/22 (complicated by UE DVT)  Barostim implant 01/29/2022   AFib Hx Diagnosed April 2022  AAD Hx none to date   Past Medical History:  Diagnosis Date   Adenomatous colon polyp    Allergy    Anal fissure    Arthritis    Asthma    Bronchitis    CHF (congestive heart failure) (HCC)    COPD, group D, by GOLD 2017 classification (HCC)    Emphysema lung (HCC)    GERD (gastroesophageal reflux disease)    History of hiatal hernia    Hyperlipidemia     Hypertension    NICM (nonischemic cardiomyopathy) (HCC)    OSA (obstructive sleep apnea)    Pneumonia    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   Requires supplemental oxygen     Past Surgical History:  Procedure Laterality Date   BIV UPGRADE N/A 10/07/2021   Procedure: BIV PPM UPGRADE;  Surgeon: Regan Lemming, MD;  Location: [facility];  Service: Cardiovascular;  Laterality: N/A;   BIV UPGRADE N/A 11/11/2022   Procedure: BIV PPM UPGRADE;  Surgeon: Regan Lemming, MD;  Location: [facility];  Service: Cardiovascular;  Laterality: N/A;   LEFT HEART CATH AND CORONARY ANGIOGRAPHY N/A 04/19/2017   Procedure: Left Heart Cath and Coronary Angiography;  Surgeon: Lyn Records, MD;  Location: [facility] INVASIVE CV LAB;  Service: Cardiovascular;  Laterality: N/A;   PACEMAKER IMPLANT N/A 04/16/2021   Procedure: PACEMAKER IMPLANT;  Surgeon: Regan Lemming, MD;  Location: [facility];  Service: Cardiovascular;  Laterality: N/A;   RIGHT HEART CATH N/A 03/09/2023   Procedure: RIGHT HEART CATH;  Surgeon: Dolores Patty, MD;  Location: [facility];  Service: Cardiovascular;  Laterality: N/A;   RIGHT/LEFT HEART CATH AND CORONARY ANGIOGRAPHY N/A 08/22/2021   Procedure: RIGHT/LEFT HEART CATH AND CORONARY ANGIOGRAPHY;  Surgeon: Dolores Patty, MD;  Location: [facility];  Service: Cardiovascular;  Laterality: N/A;   TONSILLECTOMY  Current Outpatient Medications  Medication Sig Dispense Refill   albuterol (PROVENTIL) (2.5 MG/3ML) 0.083% nebulizer solution TAKE 3 MLS BY NEBULIZATION EVERY 4 HOURS AS NEEDED FOR WHEEZING OR SHORTNESS OF BREATH 150 mL 11   allopurinol (ZYLOPRIM) 300 MG tablet Take 1 tablet (300 mg total) by mouth daily. 90 tablet 0   ALPRAZolam (XANAX) 0.5 MG tablet TAKE 1 TABLET(0.5 MG) BY MOUTH THREE TIMES DAILY 270 tablet 0   azelastine (ASTELIN) 0.1 % nasal spray Place 2 sprays into both nostrils 2 (two) times daily. Use  in each nostril as directed 30 mL 1   baclofen (LIORESAL) 10 MG tablet Take 1 tablet (10 mg total) by mouth 3 (three) times daily. 30 each 0   DALIRESP 500 MCG TABS tablet TAKE 1 TABLET(500 MCG) BY MOUTH DAILY 30 tablet 11   dextromethorphan 15 MG/5ML syrup Take 10 mLs (30 mg total) by mouth 4 (four) times daily as needed for cough. 240 mL 0   diclofenac Sodium (VOLTAREN) 1 % GEL Apply 1 Application topically in the morning and at bedtime.     doxycycline (VIBRA-TABS) 100 MG tablet Take 1 tablet (100 mg total) by mouth 2 (two) times daily. 14 tablet 0   EPINEPHrine 0.3 mg/0.3 mL IJ SOAJ injection Inject 0.3 mg into the muscle as needed for anaphylaxis. 1 each 0   fluticasone (FLONASE) 50 MCG/ACT nasal spray SHAKE LIQUID AND USE 2 SPRAYS IN EACH NOSTRIL DAILY (Patient taking differently: Place 1 spray into both nostrils daily as needed for allergies.) 16 g 2   gabapentin (NEURONTIN) 300 MG capsule Take 1 capsule (300 mg total) by mouth 3 (three) times daily. 90 capsule 4   guaiFENesin (MUCINEX) 600 MG 12 hr tablet Take 1 tablet (600 mg total) by mouth 2 (two) times daily. 60 tablet 5   ipratropium (ATROVENT) 0.06 % nasal spray Place 2 sprays into both nostrils 4 (four) times daily as needed for rhinitis. (Patient taking differently: Place 2 sprays into both nostrils 4 (four) times daily as needed for rhinitis. As needed) 45 mL 3   ipratropium-albuterol (DUONEB) 0.5-2.5 (3) MG/3ML SOLN Take 3 mLs by nebulization every 4 (four) hours as needed. 360 mL 5   loratadine (CLARITIN) 10 MG tablet Take 10 mg by mouth every other day. As needed     methocarbamol (ROBAXIN) 500 MG tablet Take 1 tablet (500 mg total) by mouth every 8 (eight) hours as needed for muscle spasms. 30 tablet 1   metolazone (ZAROXOLYN) 2.5 MG tablet Take 1 tablet (2.5 mg total) by mouth 2 (two) times a week. Take 2.5 mg on Monday/Friday 8 tablet 11   mometasone-formoterol (DULERA) 100-5 MCG/ACT AERO INHALE 2 PUFFS INTO THE LUNGS TWICE  DAILY 13 g 5   montelukast (SINGULAIR) 10 MG tablet TAKE 1 TABLET BY MOUTH EVERY DAY AT BEDTIME 30 tablet 5   omeprazole (PRILOSEC) 20 MG capsule TAKE 1 CAPSULE(20 MG) BY MOUTH TWICE DAILY BEFORE A MEAL FOR STOMACH ACID OR REFLUX 180 capsule 0   ondansetron (ZOFRAN-ODT) 4 MG disintegrating tablet Take 1 tablet (4 mg total) by mouth every 8 (eight) hours as needed for nausea or vomiting. 20 tablet 0   potassium chloride (KLOR-CON) 20 MEQ packet Take 60 mEq by mouth every morning AND 40 mEq every evening. 150 packet 5   predniSONE (DELTASONE) 10 MG tablet Take 1 tablet (10 mg total) by mouth daily with breakfast. 90 tablet 1   Testosterone 20.25 MG/1.25GM (1.62%) GEL Place 1.25 g onto the  skin daily. 112.5 g 0   Tiotropium Bromide Monohydrate (SPIRIVA RESPIMAT) 2.5 MCG/ACT AERS Inhale 2 puffs into the lungs daily. 4 g 6   torsemide (DEMADEX) 20 MG tablet Take 2 tablets (40 mg total) by mouth daily. 90 tablet 11   VENTOLIN HFA 108 (90 Base) MCG/ACT inhaler INHALE 2 PUFFS INTO THE LUNGS EVERY 6 HOURS AS NEEDED FOR WHEEZING 18 g 4   vitamin B-12 (CYANOCOBALAMIN) 1000 MCG tablet Take 1,000 mcg by mouth daily.     Vitamin D, Ergocalciferol, (DRISDOL) 1.25 MG (50000 UNIT) CAPS capsule Take 1 capsule (50,000 Units total) by mouth every 7 (seven) days. 8 capsule 0   vitamin E 180 MG (400 UNITS) capsule Take 400 Units by mouth daily.     No current facility-administered medications for this visit.    Allergies:   Bee venom, Cefdinir, Spironolactone, Bactrim [sulfamethoxazole-trimethoprim], Daliresp [roflumilast], Jardiance [empagliflozin], Penicillins, Clindamycin, Doxycycline, E-mycin [erythromycin base], and Rosuvastatin   Social History:  The patient  reports that he quit smoking about 2 years ago. His smoking use included cigarettes. He started smoking about 48 years ago. He has a 92 pack-year smoking history. He has been exposed to tobacco smoke. He has never used smokeless tobacco. He reports that he  does not currently use alcohol after a past usage of about 28.0 standard drinks of alcohol per week. He reports that he does not use drugs.   Family History:  The patient's family history includes Diabetes in his maternal grandmother; High blood pressure in his mother; Lung cancer in his father.  ROS:  Please see the history of present illness.    All other systems are reviewed and otherwise negative.   PHYSICAL EXAM:  VS:  There were no vitals taken for this visit. BMI: There is no height or weight on file to calculate BMI. Well nourished, well developed, in no acute distress HEENT: normocephalic, atraumatic Neck: no JVD, carotid bruits or masses Cardiac: *** RRR; no significant murmurs, no rubs, or gallops Lungs:***, no wheezing, rhonchi or rales Abd: soft, nontender MS: no deformity or atrophy Ext: *** trace edema Skin: warm and dry, no rash Neuro:  No gross deficits appreciated Psych: euthymic mood, full affect  *** PPM site is stable, healed well, no tethering or discomfort   EKG:  not done today    Device interrogation done today and reviewed by myself:  Battery and lead measurements are good ***  06/02/23: TTE 1. Left ventricular ejection fraction, by estimation, is 35 to 40%. The  left ventricle has moderately decreased function. The left ventricle  demonstrates regional wall motion abnormalities (see scoring  diagram/findings for description). The left  ventricular internal cavity size was severely dilated. Left ventricular  diastolic parameters are indeterminate.   2. Right ventricular systolic function is low normal. The right  ventricular size is normal.   3. The mitral valve is normal in structure. No evidence of mitral valve  regurgitation. No evidence of mitral stenosis.   4. The aortic valve is normal in structure. Aortic valve regurgitation is  trivial.   5. The inferior vena cava is dilated in size with >50% respiratory  variability, suggesting right  atrial pressure of 8 mmHg.   Comparison(s): Prior images reviewed side by side. Septal function has  improved from prior, accounting for increase in LVEF.    03/09/23: RHC Findings: RA = 7 RV = 37/11 PA = 39/20 (28) PCW = 13 Fick cardiac output/index = 5.2/2.3 PVR = 2.9 WU FA  sat = 91% PA sat = 63%   Assessment: 1. Mild PAH with normal left-sided filling pressures 2. Mildly reduced cardiac output   Plan/Discussion:  Hemodynamics improved with CRT.   RHC/LHC 08/22/21  Review of the above records today demonstrates:  Normal coronary arteries Mildly elevated filling pressures with normal output NICM EF 30-35%   TTE 07/31/21  1. Diffuse hypokinesis worse in inferior base with abnormal septal motion  EF similar to TTE done 04/16/21 . Left ventricular ejection fraction, by  estimation, is 30 to 35%. The left ventricle has moderately decreased  function. The left ventricle  demonstrates global hypokinesis. There is mild left ventricular  hypertrophy. Left ventricular diastolic parameters are indeterminate.   2. Right ventricular systolic function is moderately reduced. The right  ventricular size is mildly enlarged. There is normal pulmonary artery  systolic pressure.   3. The mitral valve is abnormal. Mild mitral valve regurgitation. No  evidence of mitral stenosis.   4. The aortic valve is tricuspid. Aortic valve regurgitation is not  visualized. Mild aortic valve sclerosis is present, with no evidence of  aortic valve stenosis.   5. Aortic dilatation noted. There is mild dilatation of the ascending  aorta, measuring 40 mm.   6. The inferior vena cava is dilated in size with <50% respiratory  variability, suggesting right atrial pressure of 15 mmHg.      04/16/21: TTE IMPRESSIONS   1. Left ventricular ejection fraction, by estimation, is 30 to 35%. The  left ventricle has moderately decreased function. The left ventricle  demonstrates global hypokinesis. The left  ventricular internal cavity size  was moderately dilated. There is  moderate left ventricular hypertrophy. Left ventricular diastolic  parameters are indeterminate.   2. Right ventricular systolic function is mildly reduced. The right  ventricular size is normal. Tricuspid regurgitation signal is inadequate  for assessing PA pressure.   3. The mitral valve is normal in structure. Trivial mitral valve  regurgitation.   4. The aortic valve is tricuspid. Aortic valve regurgitation is not  visualized. No aortic stenosis is present.   5. The inferior vena cava is normal in size with greater than 50%  respiratory variability, suggesting right atrial pressure of 3 mmHg.   Comparison(s): Compared to prior, LVEF has decreased.    12/18/2020: TTE IMPRESSIONS   1. Left ventricular ejection fraction, by estimation, is 40 to 45%. The  left ventricle has mildly decreased function. The left ventricle  demonstrates global hypokinesis. The left ventricular internal cavity size  was moderately dilated. There is mild  left ventricular hypertrophy. Left ventricular diastolic parameters are  consistent with Grade II diastolic dysfunction (pseudonormalization).  Elevated left atrial pressure.   2. Right ventricular systolic function is mildly reduced. The right  ventricular size is severely enlarged. There is moderately elevated  pulmonary artery systolic pressure. The estimated right ventricular  systolic pressure is 49.1 mmHg.   3. Left atrial size was mildly dilated.   4. Right atrial size was mildly dilated.   5. The mitral valve is normal in structure. No evidence of mitral valve  regurgitation.   6. The aortic valve was not well visualized. Aortic valve regurgitation  is not visualized. No aortic stenosis is present.   7. There is mild dilatation of the ascending aorta, measuring 37 mm.   8. The inferior vena cava is dilated in size with <50% respiratory  variability, suggesting right atrial  pressure of 15 mmHg.      08/26/2018: coronary CT  IMPRESSION: 1. Coronary calcium score of 148. This was 58 percentile for age and sex matched control.   2. Normal coronary origin with right dominance.   3. The study is affected significantly by motion. However, in the visualized portions of coronary arteries there is only mild non-obstructive plaque. Aggressive risk factor modification is recommended   4. Dilated pulmonary artery measuring 34 mm suggestive of pulmonary hypertension.     04/19/2017: LHC Normal coronary arteries. Global hypokinesis, EF approximately 40%. LVEDP 18 mmHg.    Recent Labs: 06/03/2023: Magnesium 1.9 01/07/2024: Hemoglobin 14.1; Platelets 279.0 02/04/2024: B Natriuretic Peptide 286.0 03/02/2024: ALT 14; BUN 11; Creatinine, Ser 0.91; Potassium 3.8; Sodium 141  No results found for requested labs within last 365 days.   CrCl cannot be calculated (Unknown ideal weight.).   Wt Readings from Last 3 Encounters:  03/03/24 229 lb (103.9 kg)  02/29/24 229 lb 3.2 oz (104 kg)  02/15/24 228 lb 11.2 oz (103.7 kg)     Other studies reviewed: Additional studies/records reviewed today include: summarized above  ASSESSMENT AND PLAN:  1. PPM     *** intact function     *** no programming changes made   2. AFib (?)  03/2021, patient had multiple episodes of presyncope.  He wore a cardiac monitor in 03/2021 that showed transient third-degree AV block.  He had a [employer] pacemaker implanted on 04/16/2021.  Of note, cardiac monitor also showed possible atrial fibrillation.  Dr. Elberta Fortis reviewed tracings and was not convinced it was atrial fibrillation        *** none via his device    3. NICM     GDMT has been limited by patient intolerances especially     Mentioned the metoprolol made his GI system burn from throat to anus.        ***      Disposition: ***   Current medicines are reviewed at length with the patient today.  The patient did not have any  concerns regarding medicines.  Norma Fredrickson, PA-C 03/22/2024 10:57 AM     [facility] [address] [place] [address] [phone] (office)  [phone] (fax)

## 2024-03-24 ENCOUNTER — Ambulatory Visit: Attending: Internal Medicine | Admitting: Physician Assistant

## 2024-03-24 ENCOUNTER — Encounter: Payer: Self-pay | Admitting: Physician Assistant

## 2024-03-24 VITALS — BP 114/80 | HR 98 | Ht 70.0 in | Wt 227.8 lb

## 2024-03-24 DIAGNOSIS — I428 Other cardiomyopathies: Secondary | ICD-10-CM

## 2024-03-24 DIAGNOSIS — I4719 Other supraventricular tachycardia: Secondary | ICD-10-CM | POA: Diagnosis not present

## 2024-03-24 DIAGNOSIS — Z950 Presence of cardiac pacemaker: Secondary | ICD-10-CM | POA: Diagnosis not present

## 2024-03-24 LAB — CUP PACEART INCLINIC DEVICE CHECK
Battery Remaining Longevity: 69 mo
Battery Voltage: 2.99 V
Brady Statistic RA Percent Paced: 0.81 %
Brady Statistic RV Percent Paced: 99 %
Date Time Interrogation Session: 20250404164437
Implantable Lead Connection Status: 753985
Implantable Lead Connection Status: 753985
Implantable Lead Connection Status: 753985
Implantable Lead Implant Date: 20220427
Implantable Lead Implant Date: 20220427
Implantable Lead Implant Date: 20231122
Implantable Lead Location: 753858
Implantable Lead Location: 753859
Implantable Lead Location: 753860
Implantable Pulse Generator Implant Date: 20231122
Lead Channel Impedance Value: 487.5 Ohm
Lead Channel Impedance Value: 487.5 Ohm
Lead Channel Impedance Value: 650 Ohm
Lead Channel Pacing Threshold Amplitude: 0.5 V
Lead Channel Pacing Threshold Amplitude: 0.625 V
Lead Channel Pacing Threshold Amplitude: 0.875 V
Lead Channel Pacing Threshold Pulse Width: 0.5 ms
Lead Channel Pacing Threshold Pulse Width: 0.5 ms
Lead Channel Pacing Threshold Pulse Width: 0.5 ms
Lead Channel Sensing Intrinsic Amplitude: 12 mV
Lead Channel Sensing Intrinsic Amplitude: 3.2 mV
Lead Channel Setting Pacing Amplitude: 1.625
Lead Channel Setting Pacing Amplitude: 2 V
Lead Channel Setting Pacing Amplitude: 2 V
Lead Channel Setting Pacing Pulse Width: 0.5 ms
Lead Channel Setting Pacing Pulse Width: 0.5 ms
Lead Channel Setting Sensing Sensitivity: 2 mV
Pulse Gen Model: 3222
Pulse Gen Serial Number: 3979797

## 2024-03-24 NOTE — Addendum Note (Signed)
 Addended by: Elease Etienne A on: 03/24/2024 08:19 AM   Modules accepted: Orders

## 2024-03-24 NOTE — Patient Instructions (Signed)
 Medication Instructions:   Your physician recommends that you continue on your current medications as directed. Please refer to the Current Medication list given to you today.    you need a refill on your cardiac medications before your next appointment, please call your pharmacy*   Lab Work: NONE ORDERED  TODAY    If you have labs (blood work) drawn today and your tests are completely normal, you will receive your results only by: MyChart Message (if you have MyChart) OR A paper copy in the mail If you have any lab test that is abnormal or we need to change your treatment, we will call you to review the results.    Testing/Procedures: NONE ORDERED  TODAY     Follow-Up:  At [facility], you and your health needs are our priority.  As part of our continuing mission to provide you with exceptional heart care, our providers are all part of one team.  This team includes your primary Cardiologist (physician) and Advanced Practice Providers or APPs (Physician Assistants and Nurse Practitioners) who all work together to provide you with the care you need, when you need it.  Your next appointment:  DR BENSIMHON NEXT AVAILABLE  MAY ( RECALL)   Lanna Poche ONLY!!    [facility]  CHECK  NEXT AVAILABLE OPEN   Provider:  ONE YEAR   You may see Will Jorja Loa, MD or one of the following Advanced Practice Providers on your designated Care Team:   Francis Dowse, [place]  We recommend signing up for the patient portal called "MyChart".  Sign up information is provided on this After Visit Summary.  MyChart is used to connect with patients for Virtual Visits (Telemedicine).  Patients are able to view lab/test results, encounter notes, upcoming appointments, etc.  Non-urgent messages can be sent to your provider as well.   To learn more about what you can do with MyChart, go to [email or web address].   Other Instructions        1st Floor: - Lobby - Registration  - Pharmacy  -  Lab - Cafe  2nd Floor: - PV Lab - Diagnostic Testing (echo, CT, nuclear med)  3rd Floor: - Vacant  4th Floor: - TCTS (cardiothoracic surgery) - AFib Clinic - Structural Heart Clinic - Vascular Surgery  - Vascular Ultrasound  5th Floor: - [facility]Care Cardiology (general and EP) - Clinical Pharmacy for coumadin, hypertension, lipid, weight-loss medications, and med management appointments    Valet parking services will be available as well.

## 2024-03-24 NOTE — Progress Notes (Signed)
 Remote pacemaker transmission.

## 2024-03-27 DIAGNOSIS — J9610 Chronic respiratory failure, unspecified whether with hypoxia or hypercapnia: Secondary | ICD-10-CM | POA: Diagnosis not present

## 2024-03-28 ENCOUNTER — Other Ambulatory Visit: Payer: Self-pay | Admitting: Internal Medicine

## 2024-03-28 ENCOUNTER — Encounter: Payer: Self-pay | Admitting: Internal Medicine

## 2024-03-28 ENCOUNTER — Ambulatory Visit: Attending: Family Medicine | Admitting: Physical Therapy

## 2024-03-28 ENCOUNTER — Encounter: Payer: Self-pay | Admitting: Physical Therapy

## 2024-03-28 DIAGNOSIS — M25561 Pain in right knee: Secondary | ICD-10-CM | POA: Insufficient documentation

## 2024-03-28 DIAGNOSIS — M25562 Pain in left knee: Secondary | ICD-10-CM | POA: Diagnosis not present

## 2024-03-28 DIAGNOSIS — G8929 Other chronic pain: Secondary | ICD-10-CM | POA: Insufficient documentation

## 2024-03-28 DIAGNOSIS — R6889 Other general symptoms and signs: Secondary | ICD-10-CM | POA: Insufficient documentation

## 2024-03-28 DIAGNOSIS — F411 Generalized anxiety disorder: Secondary | ICD-10-CM

## 2024-03-28 DIAGNOSIS — J449 Chronic obstructive pulmonary disease, unspecified: Secondary | ICD-10-CM | POA: Diagnosis not present

## 2024-03-28 DIAGNOSIS — R2689 Other abnormalities of gait and mobility: Secondary | ICD-10-CM | POA: Diagnosis not present

## 2024-03-28 DIAGNOSIS — M545 Low back pain, unspecified: Secondary | ICD-10-CM | POA: Diagnosis not present

## 2024-03-28 DIAGNOSIS — R531 Weakness: Secondary | ICD-10-CM | POA: Diagnosis not present

## 2024-03-28 DIAGNOSIS — Z7409 Other reduced mobility: Secondary | ICD-10-CM | POA: Insufficient documentation

## 2024-03-28 NOTE — Therapy (Signed)
 OUTPATIENT PHYSICAL THERAPY WHEELCHAIR EVALUATION   Patient Name: Robert Church MRN: [id number] DOB:26-Dec-1955, 68 y.o., male Today's Date: 03/28/2024  END OF SESSION:  PT End of Session - 03/28/24 1400     Visit Number 1    Number of Visits 1    Authorization Type BCBS Medicare    Authorization Time Period 03-28-24 - 04-27-24    PT Start Time 1315    PT Stop Time 1355    PT Time Calculation (min) 40 min    Activity Tolerance Treatment limited secondary to medical complications (Comment)    Behavior During Therapy [facility] for tasks assessed/performed             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   Requires supplemental oxygen    Past Surgical History:  Procedure Laterality Date   BIV UPGRADE N/A 10/07/2021   Procedure: BIV PPM UPGRADE;  Surgeon: Regan Lemming, MD;  Location: [facility] INVASIVE CV LAB;  Service: Cardiovascular;  Laterality: N/A;   BIV UPGRADE N/A 11/11/2022   Procedure: BIV PPM UPGRADE;  Surgeon: Regan Lemming, MD;  Location: [facility] INVASIVE CV [facility];  Service: Cardiovascular;  Laterality: N/A;   LEFT HEART CATH AND CORONARY ANGIOGRAPHY N/A 04/19/2017   Procedure: Left Heart Cath and Coronary Angiography;  Surgeon: Lyn Records, MD;  Location: [facility] INVASIVE CV LAB;  Service: Cardiovascular;  Laterality: N/A;   PACEMAKER IMPLANT N/A 04/16/2021   Procedure: PACEMAKER IMPLANT;  Surgeon: Regan Lemming, MD;  Location: [facility] INVASIVE CV LAB;  Service: Cardiovascular;  Laterality: N/A;   RIGHT HEART CATH N/A  03/09/2023   Procedure: RIGHT HEART CATH;  Surgeon: Dolores Patty, MD;  Location: [facility] INVASIVE CV [facility];  Service: Cardiovascular;  Laterality: N/A;   RIGHT/LEFT HEART CATH AND CORONARY ANGIOGRAPHY N/A 08/22/2021   Procedure: RIGHT/LEFT HEART CATH AND CORONARY ANGIOGRAPHY;  Surgeon: Dolores Patty, MD;  Location: [facility] INVASIVE CV LAB;  Service: Cardiovascular;  Laterality: N/A;   TONSILLECTOMY     Patient Active Problem List   Diagnosis Date Noted   Herniation of left lung 02/29/2024   Gait instability 02/09/2024   Generalized muscle weakness 02/09/2024   Left upper quadrant abdominal mass 02/09/2024   Chronic pain of both lower extremities 02/09/2024   Olecranon bursitis of right elbow 02/09/2024   Chronic tophaceous gout 11/10/2023   Diabetes mellitus (HCC) 11/10/2023   Screening for colon cancer 11/08/2023   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 11/08/2023   Obesity (BMI 30-39.9) 06/03/2023   Alcoholic cirrhosis of liver without ascites (HCC) 03/19/2023   Bilateral plantar fasciitis 01/04/2023   Cervical radiculopathy 01/03/2023   Bilateral primary osteoarthritis of knee 06/30/2022   Prostate cancer screening 06/29/2022   Chronic respiratory failure with hypoxia (HCC) 11/24/2021   Encounter for general adult medical examination with abnormal findings 08/20/2021   GAD (generalized anxiety disorder) 07/04/2021  Degenerative disc disease, cervical 04/21/2021   Carotid artery calcification 04/09/2021   Panlobular emphysema (HCC) 03/25/2021   Atherosclerosis of aorta (HCC) 03/25/2021   Body mass index (BMI) 32.0-32.9, adult 01/15/2021   Lumbar spondylosis 01/10/2021   Foraminal stenosis of cervical region 01/10/2021   Chronic bilateral low back pain without sciatica 01/09/2021   Type II diabetes mellitus with manifestations (HCC) 11/29/2020   Primary osteoarthritis of both knees 09/24/2020   Idiopathic chronic gout of foot without tophus 08/08/2020   Yellow jacket  sting allergy 08/08/2020   Hyperlipidemia LDL goal <70 07/11/2020   Hypertriglyceridemia 07/08/2020   Chronic combined systolic and diastolic CHF (congestive heart failure) (HCC) 01/09/2020   Solitary pulmonary nodule 08/15/2019   NICM (nonischemic cardiomyopathy) (HCC) 12/16/2018   Cigarette smoker 01/16/2015   COPD (chronic obstructive pulmonary disease) (HCC) 06/15/2014   COPD with acute exacerbation (HCC) 03/03/2013   Obstructive sleep apnea syndrome 01/05/2013   Asbestos exposure 01/05/2013   Seasonal allergic rhinitis 10/06/2012   GERD (gastroesophageal reflux disease) 06/05/2012   Intrinsic asthma 07/23/2011   HTN (hypertension) 07/23/2011    PCP: [name]., MD  REFERRING PROVIDER: Sherald Barge, FNP  REFERRING DIAG:  J96.11 (ICD-10-CM) - Chronic respiratory failure with hypoxia M47.816 (ICD-10-CM) - Spondylosis without myelopathy or radiculopathy, lumbar region M17.0 (ICD-10-CM) - Bilateral primary osteoarthritis of knee R26.81 (ICD-10-CM) - Unsteadiness on feet M62.81 (ICD-10-CM) - Muscle weakness (generalized)   THERAPY DIAG:  Other abnormalities of gait and mobility - Plan: PT plan of care cert/re-cert  Chronic low back pain without sciatica, unspecified back pain laterality - Plan: PT plan of care cert/re-cert  Chronic pain of both knees - Plan: PT plan of care cert/re-cert  Decreased strength, endurance, and mobility - Plan: PT plan of care cert/re-cert  ONSET DATE: 2022;  Referral date 03-07-24  Rationale for Evaluation and Treatment: Habilitation  SUBJECTIVE:                                                                                                                                                                                           SUBJECTIVE STATEMENT: Pt presents for power wheelchair eval in clinic's manual wheelchair; pt states he walked to his car "it was not easy, but I made it" and then his friend got the clinic's wheelchair for him  when they arrived at this facility   PERTINENT HISTORY:  See above PAIN:  Are you having pain? Yes: NPRS scale: 8/10 Pain location: back pain and bil. Knee pain Pain description: chronic pain Aggravating factors: weight bearing Relieving factors: rest, non- weight bearing  PRECAUTIONS: Fall and ICD/Pacemaker  WEIGHT BEARING RESTRICTIONS:  No  FALLS:  Has patient fallen in last 6 months? No  LIVING ENVIRONMENT: Lives with: lives alone Lives in: House/apartment Stairs:  1 step at back of house Has following equipment at home: Single point cane and Environmental consultant - 2 wheeled  OCCUPATION: retired  PLOF: Independent with basic ADLs and Independent with household mobility with device  PATIENT GOALS: obtain power wheelchair for independence with household mobility   PATIENT INFORMATION: This Evaluation form will serve as the LMN for the following suppliers:  Supplier:  [employer] Contact Person:  Saddie Benders, [employer] Phone:  [phone]   Reason for Referral: Patient/caregiver Goals: Patient was seen for face-to-face evaluation for new power wheelchair.  Also present was [employer], [employer] to discuss recommendations and wheelchair options.  Further paperwork was completed and sent to vendor.  Patient appears to qualify for power mobility device at this time per objective findings.    MEDICAL HISTORY: Diagnosis: COPD, CHF, Emphysema, Lumbar spondylosis Primary Diagnosis Onset:  2022 [] Progressive Disease Relevant Past and Future Surgeries:  Lt heart cath 04-19-17:  pacemaker implant 04-16-21:  Rt heart cath 03-09-23:  Rt/Lt heart cath & coronary angiography 08-22-21 Height: 5'10" Weight:  227# Explain and recent changes or trends in weight:   Relevant History including falls: Pt reports no falls in past 6 months; pt reports he has RW and a SPC - uses either device depending on functional status that day.  Pt went to Urgent Care on 12-31-23 with persistent coughing which he thinks resulted in a rib  fracture.  Diagnosis was pneumonia.  Pt reports increased pain since this episode. Pt has hx of CHF, COPD, and emphysema as well as lumbar spondylosis,  cervical DDD, and cervical radiculopathy.     HOME ENVIRONMENT: [x] House  [] Condo/town home  [] Apartment  [] Assisted Living  [x] Lives Alone []  Lives with Others                                                    Hours with caregiver:   [x] Home is accessible to patient            Stairs  [x] Yes []  No     Ramp [] Yes [] No Comments:  1 step at back door    COMMUNITY ADL: TRANSPORTATION: [] Car    [] Van    [] Public Transportation    [] Adapted w/c Lift   [] Ambulance   [x] Other: SUV      [] Sits in wheelchair during transport  Employment/School:     Specific requirements pertaining to mobility                                                     Other:                                       FUNCTIONAL/SENSORY PROCESSING SKILLS:  Handedness:   [x] Right     [] Left    [] NA  Comments:  Functional Processing Skills for Wheeled Mobility [x] Processing Skills are adequate for safe wheelchair operation  Areas of concern than may interfere with safe operation of wheelchair Description of problem   []  Attention to environment     [] Judgment     []  Hearing  []  Vision or visual processing    [] Motor Planning  []  Fluctuations in Behavior                                                   VERBAL COMMUNICATION: [x] WFL receptive [x]  WFL expressive [] Understandable  [] Difficult to understand  [] non-communicative []  Uses an augmented communication device    CURRENT SEATING / MOBILITY: Current Mobility Base:   [x] None  [] Dependent  [] Manual  [] Scooter  [] Power   Type of Control:                       Manufacturer:                         Size:                         Age:                           Current Condition of Mobility Base:                                                                                                                      Current Wheelchair components:                                                                                                                                   Describe posture in present seating system:                                                                            SENSATION and SKIN ISSUES: Sensation [x] Intact [] Impaired []   Absent   Level of sensation:                           Pressure Relief: Able to perform effective pressure relief :   [x] Yes  []  No Method:                                                                              If not, Why?:                                                                          Skin Issues/Skin Integrity Current Skin Issues   [] Yes [x] No  [] Intact []  Red area []  Open Area  [] Scar Tissue [] At risk from prolonged sitting  Where                              History of Skin Issues   [] Yes [x] No  Where                                         When                                               Hx of skin flap surgeries [] Yes [x] No  Where                  When                                                  Limited sitting tolerance [] Yes [x] No Hours spent sitting in wheelchair daily:                                                         Complaint of Pain:  Please describe: chronic, constant pain due to OA and DDD in lumbar spine; rates pain 8/10 at current time  Swelling/Edema:  bil. Ankles- takes diuretic medication                                                                                                                                             ADL STATUS (in reference to wheelchair use):  Indep Assist Unable Indep with Equip Not assessed Comments  Dressing                               X                       Dresses in seated position                   Eating    X                                                                                                                           Toileting                                         X                          Uses towel rack for grab bar; is having new toilet and grab bars installed                                                            Bathing                                      X                        Uses tub bench; uses hand held shower  Grooming/ Hygiene                                      X                         Performs in seated position                                                               Meal Prep                                     X                     Performs in seated position                                                                 IADLS                          X                                                                                        Bowel Management: [x] Continent  [] Incontinent  [] Accidents Comments:                                                  Bladder Management: [x] Continent  [] Incontinent  [] Accidents Comments:                                              WHEELCHAIR SKILLS: Manual w/c Propulsion: [] UE or LE strength and endurance sufficient to participate in ADLs using manual wheelchair Arm :  [] left [] right  [] Both                                   Foot:   [] left [] right  [] Both  Distance:   Operate Scooter: []  Strength, hand grip, balance and transfer appropriate for use [] Living environment is accessible for use of scooter  Operate Power w/c:  [x]  Std. Joystick   []  Alternative Controls Indep [x]  Assist []  Dependent/ Unable []  N/A []  [x] Safe          [  x] Functional      Distance:  100'              Bed confined without wheelchair []  Yes [x]  No   STRENGTH/RANGE OF MOTION:  Range of Motion Strength  Shoulder   Rt shoulder flexion 134:  abdct. = 122:  Lt  shoulder flexion 122:  abdct. = 100                                                  3-/5 bil. Shoulder flexors & abductors                                                         Elbow       WNL's                                            WFL's's                                                             Wrist/Hand    WNL's                                                               WFL's                                                                      Hip    WFL's but c/o pain with active movement                                                              Unable to accurately assess due to c/o back with active hip flexion in seated position - at least 3/5 bil. LE's                                                           Knee     WFL's - c/o pain with movement  C/o pai with resistance - at least 4/5 for bil. quads                                                              Ankle WFL's    WFL's bil. LE's                                                                  MOBILITY/BALANCE:  []  Patient is totally dependent for mobility                                                                                               Balance Transfers Ambulation  Sitting Balance: Standing Balance: [x]  Independent [x]  Independent/Modified Independent (per pt report)  [x]  WFL     [x]  WFL []  Supervision []  Supervision  []  Uses UE for balance  []  Supervision []  Min Assist []  Ambulates with Assist                           []  Min Assist []  Min assist []  Mod Assist [x]  Ambulates with Device:  [x]  RW   []  StW   []  Cane   []                 []  Mod Assist []  Mod assist []  Max assist   []  Max Assist []  Max assist []  Dependent [x]  Indep. Short Distance Only  []  Unable []  Unable []  Lift / Sling Required Distance (in feet)  15'                           []  Sliding board [x]  Unable to Ambulate: (Explain: pt declined attempting TUG test during eval  due to dyspnea/SOB and difficulty breathing due to COPD)   Cardio Status:  [] Intact  [x]  Impaired   []  NA   pt has CHF; has pacemaker implant (June 2022)                           Respiratory Status:  [] Intact   [x] Impaired   [] NA    COPD, emphysema, asthma, OSA:   pt is on 2.5 L O2  at night                               Orthotics/Prosthetics:  Comments (Address manual vs power w/c vs scooter):  Unable to complete TUG/gait assessment during eval due to pt's request not to attempt due to respiratory distress/dyspnea/SOB and chronic pain - pt stated he had flare up of COPD this morning due to pollen.  Pt reports he ambulates very short distances in his home with either a RW or a SPC, based on how he is feeling and functioning on that particular day.  Pt is unable to functionally and effectively propel a manual wheelchair due to his extensive cardiac and respiratory dysfunction including COPD, CHF with pacemaker implant (June 2022), emphysema, asthma and pleural effusion in Feb. 2025.  Pt requires a power wheelchair to increase independence and safety with household mobility and ADL's and to reduce chronic pain with mobility as pt is unable to stand for prolonged periods of time due to back and knee pain.  Pt is unable to use a scooter due to his inability to safely transfer on/off the platform of a scooter and also his home is not accessible for the large turning radius of a scooter.  A group 2 power wheelchair is a medical necessity for Mr. Simkins to minimize pain and maximize his mobility in a safe manner.                                       Anterior / Posterior Obliquity Rotation-Pelvis  PELVIS    [x] Neutral  []  Posterior  []  Anterior     [x] WFL  [] Right Elevated  [] Left Elevated   [x] WFL  [] Right Anterior []   Left Anterior    []  Fixed []  Partly Flexible [x]  Flexible  []  Other  []  Fixed  []  Partly Flexible  [x]   Flexible []  Other  []  Fixed  []  Partly Flexible  [x]  Flexible []  Other  TRUNK [x] WFL [] Thoracic Kyphosis [] Lumbar Lordosis   [x]  WFL [] Convex Right [] Convex Left   [] c-curve [] s-curve [] multiple  [x]  Neutral []  Left-anterior []  Right-anterior    []  Fixed [x]  Flexible []  Partly Flexible       Other  []  Fixed [x]  Flexible []  Partly Flexible []  Other  []  Fixed           [x]  Flexible []  Partly Flexible []  Other   Position Windswept   HIPS  [x]  Neutral []  Abduct []  ADduct [x]  Neutral []  Right []  Left       []  Fixed  []  Partly Flexible             []  Dislocated [x]  Flexible []  Subluxed    []  Fixed []  Partly Flexible  [x]  Flexible []  Other              Foot Positioning Knee Positioning   Knees and  Feet  [x]  WFL [x] Left [x] Right [x]  WFL [x] Left [x] Right   KNEES ROM concerns: ROM concerns:   & Dorsi-Flexed                    [] Lt [] Rt                                  FEET Plantar Flexed                  [] Lt [] Rt     Inversion                    []   Lt [] Rt     Eversion                    [] Lt [] Rt    HEAD [x]  Functional [x]  Good Head Control   & []  Flexed         []  Extended []  Adequate Head Control   NECK []  Rotated  Lt  []  Lat Flexed Lt []  Rotated  Rt []  Lat Flexed Rt []  Limited Head Control    []  Cervical Hyperextension []  Absent  Head Control    SHOULDERS ELBOWS WRIST& HAND         Left     Right    Left     Right  U/E [x] Functional  Left            [x] Functional  Right                                 [] Fisting             [] Fisting     [] elevated Left [] depressed  Left [] elevated Right [] depressed  Right      [] protracted Left [] retracted Left [] protracted Right [] retracted Right [] subluxed  Left              [] subluxed  Right         Goals for Wheelchair Mobility  [x]  Independence with mobility in the home with motor related ADLs (MRADLs)  []  Independence with MRADLs in the community []  Provide dependent mobility  []  Provide  recline     [] Provide tilt   Goals for Seating system [x]  Optimize pressure distribution [x]  Provide support needed to facilitate function or safety []  Provide corrective forces to assist with maintaining or improving posture []  Accommodate client's posture: current seated postures and positions are not flexible or will not tolerate corrective forces []  Client to be independent with relieving pressure in the wheelchair [] Enhance physiological function such as breathing, swallowing, digestion  Simulation ideas/Equipment trials:                                                                                                State why other equipment was unsuccessful:                                                                           MOBILITY BASE RECOMMENDATIONS and JUSTIFICATION: MOBILITY COMPONENT JUSTIFICATION  Manufacturer: Dance movement psychotherapist: [employer]             Size: Width 18"           Seat Depth 18"          [x] provide transport from point A to B [x] promote Indep mobility  [x] is not a  safe, functional ambulator [x] walker or cane inadequate [] non-standard width/depth necessary to accommodate anatomical measurement []                             [] Manual Mobility Base [] non-functional ambulator    [] Scooter/POV  [] can safely operate  [] can safely transfer   [] has adequate trunk stability  [] cannot functionally propel manual w/c  [x] Power Mobility Base  [] non-ambulatory  [x] cannot functionally propel manual wheelchair  [x]  cannot functionally and safely operate scooter/POV [x] can safely operate and willing to  [] Stroller Base [] infant/child  [] unable to propel manual wheelchair [] allows for growth [] non-functional ambulator [] non-functional UE [] Indep mobility is not a goal at this time  [] Tilt  [] Forward                   [] Backward                  [] Powered tilt              [] Manual tilt  [] change position against gravitational force on head and shoulders  [] change  position for pressure relief/cannot weight shift [] transfers  [] management of tone [] rest periods [] control edema [] facilitate postural control  []                                       [] Recline  [] Power recline on power base [] Manual recline on manual base  [] accommodate femur to back angle  [] bring to full recline for ADL care  [] change position for pressure relief/cannot weight shift [] rest periods [] repositioning for transfers or clothing/diaper /catheter changes [] head positioning  [] Lighter weight required [] self- propulsion  [] lifting []                                                 [] Heavy Duty required [] user weight greater than 250# [] extreme tone/ over active movement [] broken frame on previous chair []                                     [x]  Back  []  Angle Adjustable []  Custom molded     Captain's seat                      [] postural control [] control of tone/spasticity [] accommodation of range of motion [] UE functional control [x] accommodation for seating system []                                          [] provide lateral trunk support [] accommodate deformity [x] provide posterior trunk support [x] provide lumbar/sacral support [x] support trunk in midline [] Pressure relief over spinal processes  [x]  Seat Cushion     Captain's seat                  [] impaired sensation  [] decubitus ulcers present [] history of pressure ulceration [] prevent pelvic extension [x] low maintenance  [] stabilize pelvis  [] accommodate obliquity [] accommodate multiple deformity [x] neutralize lower extremity position [x] increase pressure distribution []                                           []   Pelvic/thigh support  []  Lateral thigh guide []  Distal medial pad  []  Distal lateral pad []  pelvis in neutral [] accommodate pelvis []  position upper legs []  alignment []  accommodate ROM []  decrease adduction [] accommodate tone [] removable for transfers [] decrease abduction  []  Lateral  trunk Supports []  Lt     []  Rt [] decrease lateral trunk leaning [] control tone [] contour for increased contact [] safety  [] accommodate asymmetry []                                                 []  Mounting hardware  [] lateral trunk supports  [] back   [] seat [] headrest      []  thigh support [] fixed   [] swing away [] attach seat platform/cushion to w/c frame [] attach back cushion to w/c frame [] mount postural supports [] mount headrest  [] swing medial thigh support away [] swing lateral supports away for transfers  []                                                     Armrests  [] fixed [x] adjustable height [] removable   [] swing away  [x] flip back   [] reclining [x] full length pads [] desk    [] pads tubular  [x] provide support with elbow at 90   [] provide support for w/c tray [x] change of height/angles for variable activities [] remove for transfers [x] allow to come closer to table top [] remove for access to tables []                                               Hangers/ Leg rests  [] 60 [] 70 [] 90 [] elevating [] heavy duty  [] articulating [] fixed [] lift off [] swing away     [] power [] provide LE support  [] accommodate to hamstring tightness [] elevate legs during recline   [] provide change in position for Legs [] Maintain placement of feet on footplate [] durability [] enable transfers [] decrease edema [] Accommodate lower leg length []                                         Foot support Footplate    [] Lt  []  Rt  [x]  Center mount [x] flip up                            [x] depth/angle adjustable [] Amputee adapter    []  Lt     []  Rt [x] provide foot support [x] accommodate to ankle ROM [x] transfers [] Provide support for residual extremity []  allow foot to go under wheelchair base []  decrease tone  []                                                 []  Ankle strap/heel loops [] support foot on foot support [] decrease extraneous movement [] provide input to heel  [] protect foot  Tires:  [] pneumatic  [x] flat free inserts  [] solid  [x] decrease maintenance  [x] prevent frequent flats [] increase shock absorbency [] decrease pain  from road shock [] decrease spasms from road shock []                                              []  Headrest  [] provide posterior head support [] provide posterior neck support [] provide lateral head support [] provide anterior head support [] support during tilt and recline [] improve feeding   [] improve respiration [] placement of switches [] safety  [] accommodate ROM  [] accommodate tone [] improve visual orientation  []  Anterior chest strap []  Vest []  Shoulder retractors  [] decrease forward movement of shoulder [] accommodation of TLSO [] decrease forward movement of trunk [] decrease shoulder elevation [] added abdominal support [] alignment [] assistance with shoulder control  []                                               Pelvic Positioner [x] Belt [] SubASIS bar [] Dual Pull [] stabilize tone [x] decrease falling out of chair/ **will not Decrease potential for sliding due to pelvic tilting [] prevent excessive rotation [] pad for protection over boney prominence [] prominence comfort [] special pull angle to control rotation []                                                  Upper ExtremitySupport  [] L   []  R [] Arm trough   [] hand support []  tray       [] full tray [] swivel mount [] decrease edema      [] decrease subluxation   [] control tone   [] placement for AAC/Computer/EADL [] decrease gravitational pull on shoulders [] provide midline positioning [] provide support to increase UE function [] provide hand support in natural position [] provide work surface   POWER WHEELCHAIR CONTROLS  [x] Proportional  [] Non-Proportional Type                                      [] Left  [x] Right [x] provides access for controlling wheelchair   [] lacks motor control to operate proportional drive control [] unable to understand proportional controls  Actuator  Control Module  [] Single  [] Multiple   [] Allow the client to operate the power seat function(s) through the joystick control   [] Safety Reset Switches [] Used to change modes and stop the wheelchair when driving in latch mode    [] Upgraded Electronics   [] programming for accurate control [] progressive Disease/changing condition [] non-proportional drive control needed [] Needed in order to operate power seat functions through joystick control   [] Display box [] Allows user to see in which mode and drive the wheelchair is set  [] necessary for alternate controls    [] Digital interface electronics [] Allows w/c to operate when using alternative drive controls  [] ASL Head Array [] Allows client to operate wheelchair  through switches placed in tri-panel headrest  [] Sip and puff with tubing kit [] needed to operate sip and puff drive controls  [] Upgraded tracking electronics [] increase safety when driving [] correct tracking when on uneven surfaces  [x] Mount for switches or joystick [] Attaches switches to w/c  [x] Swing away for access or transfers [] midline for optimal placement [] provides for consistent access  [] Attendant controlled joystick plus mount [] safety [] long distance driving [] operation of seat functions [] compliance with transportation  regulations []                                             Rear wheel placement/Axle adjustability [] None [] semi adjustable [] fully adjustable  [] improved UE access to wheels [] improved stability [] changing angle in space for improvement of postural stability [] 1-arm drive access [] amputee pad placement []                                Wheel rims/ hand rims  [] metal   [] plastic coated [] oblique projections           [] vertical projections [] Provide ability to propel manual wheelchair  []  Increase self-propulsion with hand weakness/decreased grasp  Push handles [] extended   [] angle adjustable              [] standard [] caregiver access [] caregiver  assist [] allows "hooking" to enable increased ability to perform ADLs or maintain balance  One armed device   [] Lt   [] Rt [] enable propulsion of manual wheelchair with one arm   []                                            Brake/wheel lock extension []  Lt   []  Rt [] increase indep in applying wheel locks   [] Side guards [] prevent clothing getting caught in wheel or becoming soiled []  prevent skin tears/abrasions  Battery: U1 x 2                                       [x] to power wheelchair                                                         Other:                                                                                                                        The above equipment has a life- long use expectancy. Growth and changes in medical and/or functional conditions would be the exceptions. This is to certify that the therapist has no financial relationship with durable medical provider or manufacturer. The therapist will not receive remuneration of any kind for the equipment recommended in this evaluation.   Patient has mobility limitation that significantly impairs safe, timely participation in one or more mobility related ADL's. (bathing, toileting, feeding, dressing, grooming, moving from room to room)  [x]  Yes []  No  Will mobility device sufficiently improve ability to participate and/or  be aided in participation of MRADL's?      [x]  Yes []  No  Can limitation be compensated for with use of a cane or walker?                                    []  Yes [x]  No  Does patient or caregiver demonstrate ability/potential ability & willingness to safely use the mobility device?    [x]  Yes []  No  Does patient's home environment support use of recommended mobility device?            [x]  Yes []  No  Does patient have sufficient upper extremity function necessary to functionally propel a manual wheelchair?     []  Yes [x]  No  Does patient have sufficient strength and trunk stability to safely  operate a POV (scooter)?                                  []  Yes [x]  No  Does patient need additional features/benefits provided by a power wheelchair for MRADL's in the home?        [x]  Yes []  No  Does the patient demonstrate the ability to safely use a power wheelchair?                   [x]  Yes []  No     Physician's Name Printed:                                                        Physician's Signature: Sherald Barge, FNP Date:     This is to certify that I, the above signed therapist have the following affiliations: []  This DME provider []  Manufacturer of recommended equipment []  Patient's long term care facility [x]  None of the above  Therapist Name/Signature:  Kerry Fort, PT                                          Date:  03-28-24  ASSESSMENT:  CLINICAL IMPRESSION: Patient is a 68 y.o. gentleman who was seen today for physical therapy evaluation for power wheelchair.  Pt has COPD, CHF, emphysema and chronic pain due to lumbar spondylosis and cervical radiculopathy resulting in chronic pain.  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  Pt declined attempting ambulation assessment (TUG test) due to SOB and COPD exacerbation experienced earlier today.  O CLINICAL DECISION MAKING: Evolving/moderate complexity  EVALUATION COMPLEXITY: Moderate  PLAN:  PT FREQUENCY: one time visit  PT DURATION: 1 week  PLANNED INTERVENTIONS: 97535- Self Care and initial eval for power wheelchair .  PLAN FOR NEXT SESSION: N/A - eval only   Kary Kos, PT 03/28/2024, 7:55 PM

## 2024-03-30 ENCOUNTER — Ambulatory Visit: Payer: Medicare Other | Admitting: Emergency Medicine

## 2024-03-30 ENCOUNTER — Telehealth: Payer: Self-pay

## 2024-03-30 ENCOUNTER — Encounter: Payer: Self-pay | Admitting: Emergency Medicine

## 2024-03-30 VITALS — BP 123/85 | HR 79 | Ht 70.0 in | Wt 232.4 lb

## 2024-03-30 DIAGNOSIS — J302 Other seasonal allergic rhinitis: Secondary | ICD-10-CM | POA: Diagnosis not present

## 2024-03-30 DIAGNOSIS — J441 Chronic obstructive pulmonary disease with (acute) exacerbation: Secondary | ICD-10-CM | POA: Diagnosis not present

## 2024-03-30 DIAGNOSIS — F17210 Nicotine dependence, cigarettes, uncomplicated: Secondary | ICD-10-CM | POA: Diagnosis not present

## 2024-03-30 DIAGNOSIS — J9611 Chronic respiratory failure with hypoxia: Secondary | ICD-10-CM

## 2024-03-30 DIAGNOSIS — J984 Other disorders of lung: Secondary | ICD-10-CM

## 2024-03-30 NOTE — Assessment & Plan Note (Signed)
 He saw Dr. Cliffton Asters.  Surgery would be quite risky given his multiple medical problems and low yield, low likelihood to achieve good results.

## 2024-03-30 NOTE — Progress Notes (Signed)
 Subjective:  Patient ID: Robert Church, male    DOB: 09-Jun-1956  Age: 68 y.o. MRN: [id number]  CC:  Chief Complaint  Patient presents with   Follow-up    Review CT 3/13. Pt complains of chest congestion, sob during exertion/bending over. Pain in ribs.    HPI  ROV 08/18/2023 -- Robert Church is 30 with multifactorial chronic hypoxemic respiratory failure.  He has severe COPD, continues to smoke.  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  He has been managed on a trilogy ventilator.  Maintenance regimen Spiriva and Dulera, prednisone 10 mg, Daliresp, fluticasone and Atrovent nasal spray, omeprazole twice daily. He stopped Singulair after he experienced throat swallowing after taking it.  He reports today that his Trilogy has been leaving a white powder, ? Whether he needs a new one. He was hospitalized in June for volume overload, cellulitis.  He is still having congestion, yellow drainage. Still smoking some every day.   ROV 03/30/2024 --68 year old man with multifactorial chronic hypoxemic respiratory failure due to very severe COPD with continued tobacco use, ischemic cardiomyopathy with third-degree heart block and pacemaker in place followed by the advanced heart failure team, obesity hypoventilation syndrome.  Currently managed on trilogy ventilator He has left sided rib fractures with parenchymal eventration and associated tenderness, pain.  Currently managed on Daliresp, prednisone 10 mg, Dulera, Spiriva, guaifenesin, albuterol as needed. He is dealing with a lot of congestion - astelin and flonase, singulair. Smoking a few a day, chews on a cigar.     Objective:   Today's Vitals: Vitals:   03/30/24 1506  BP: 123/85  Pulse: 79  SpO2: 93%  Weight: 232 lb 6.4 oz (105.4 kg)  Height: 5\' 10"  (1.778 m)    Gen: Pleasant, well-nourished, in no distress,  normal  affect  ENT: No lesions,  mouth clear,  oropharynx clear, no postnasal drip  Neck: No JVD, no stridor  Lungs: No use of accessory muscles, bilateral scattered rhonchi right greater than left  Cardiovascular: RRR, heart sounds normal, no murmur or gallops, no peripheral edema  Musculoskeletal: No deformities, no cyanosis or clubbing  Neuro: alert, awake, non focal  Skin: No rash  Assessment & Plan:  COPD (chronic obstructive pulmonary disease) (HCC) Continue your Dulera and Spiriva as you have been taking them Continue Daliresp as you have been taking it Continue prednisone 10 mg once daily Continue Singulair 10 mg each evening We will try starting Ohtuvayre nebulizer treatments twice a day Continue guaifenesin (Mucinex) as you have been taking it Please follow in our office in 3 months so that we can see how you are doing on the new medication  Chronic respiratory failure with hypoxia (HCC) Please continue to use your ventilator at night while sleeping Use your oxygen with exertion to keep your saturations > 90%  Cigarette smoker Still smoking.  Reviewed cessation goals with him today.  Seasonal allergic rhinitis Chronic rhinitis with a high mucus burden.  Continue Singulair 10 mg each evening Continue guaifenesin (Mucinex) as you have been taking it Use your Astelin nasal spray and Flonase nasal sprays twice daily   Herniation of left lung He saw Dr. Cliffton Asters.  Surgery would be quite risky given his multiple medical problems and low yield, low likelihood to achieve good results.       Levy Pupa, MD, PhD 03/30/2024, 3:25 PM [facility] [phone] or  if no answer [phone]

## 2024-03-30 NOTE — Telephone Encounter (Signed)
 Ohtuvayre forms placed in pharmacy bin.

## 2024-03-30 NOTE — Assessment & Plan Note (Signed)
 Chronic rhinitis with a high mucus burden.  Continue Singulair 10 mg each evening Continue guaifenesin (Mucinex) as you have been taking it Use your Astelin nasal spray and Flonase nasal sprays twice daily

## 2024-03-30 NOTE — Assessment & Plan Note (Signed)
 Still smoking.  Reviewed cessation goals with him today.

## 2024-03-30 NOTE — Assessment & Plan Note (Signed)
 Continue your [name] and Spiriva as you have been taking them Continue Daliresp as you have been taking it Continue prednisone 10 mg once daily Continue Singulair 10 mg each evening We will try starting Ohtuvayre nebulizer treatments twice a day Continue guaifenesin (Mucinex) as you have been taking it Please follow in our office in 3 months so that we can see how you are doing on the new medication

## 2024-03-30 NOTE — Telephone Encounter (Signed)
 Copied from CRM [id number]. Topic: Clinical - Prescription Issue >> Mar 30, 2024  8:40 AM Gurney Maxin H wrote: Reason for CRM: Patient needs a refill for the ALPRAZolam Prudy Feeler) 0.5 MG tablet sent in to the [facility] on [address] to hold him over until his appointment with Dr. Yetta Barre on 5/6 at 1:40. Patient states he is handicapped and hard for him to get a ride in to clinic and wants to know if his appointment for medication refill can be done virtually, please reach out to patient, thanks.  Herrick  [phone]

## 2024-03-30 NOTE — Patient Instructions (Addendum)
 Please continue to use your ventilator at night while sleeping Use your oxygen with exertion to keep your saturations > 90% Continue your Dulera and Spiriva as you have been taking them Continue Daliresp as you have been taking it Continue prednisone 10 mg once daily Continue Singulair 10 mg each evening We will try starting Ohtuvayre nebulizer treatments twice a day Continue guaifenesin (Mucinex) as you have been taking it Use your Astelin nasal spray and Flonase nasal sprays twice daily You need to work on stopping smoking altogether Please follow in our office in 3 months so that we can see how you are doing on the new medication

## 2024-03-30 NOTE — Assessment & Plan Note (Signed)
 Please continue to use your ventilator at night while sleeping Use your oxygen with exertion to keep your saturations > 90%

## 2024-04-03 ENCOUNTER — Ambulatory Visit (INDEPENDENT_AMBULATORY_CARE_PROVIDER_SITE_OTHER): Admitting: Internal Medicine

## 2024-04-03 ENCOUNTER — Encounter: Payer: Self-pay | Admitting: Internal Medicine

## 2024-04-03 VITALS — BP 116/80 | HR 88 | Temp 97.8°F | Resp 16 | Ht 70.0 in | Wt 231.7 lb

## 2024-04-03 DIAGNOSIS — E118 Type 2 diabetes mellitus with unspecified complications: Secondary | ICD-10-CM | POA: Diagnosis not present

## 2024-04-03 DIAGNOSIS — E785 Hyperlipidemia, unspecified: Secondary | ICD-10-CM

## 2024-04-03 DIAGNOSIS — E781 Pure hyperglyceridemia: Secondary | ICD-10-CM

## 2024-04-03 DIAGNOSIS — J9611 Chronic respiratory failure with hypoxia: Secondary | ICD-10-CM

## 2024-04-03 DIAGNOSIS — F411 Generalized anxiety disorder: Secondary | ICD-10-CM

## 2024-04-03 DIAGNOSIS — T466X5A Adverse effect of antihyperlipidemic and antiarteriosclerotic drugs, initial encounter: Secondary | ICD-10-CM

## 2024-04-03 DIAGNOSIS — M1A9XX1 Chronic gout, unspecified, with tophus (tophi): Secondary | ICD-10-CM

## 2024-04-03 DIAGNOSIS — G720 Drug-induced myopathy: Secondary | ICD-10-CM

## 2024-04-03 DIAGNOSIS — M1A0221 Idiopathic chronic gout, left elbow, with tophus (tophi): Secondary | ICD-10-CM | POA: Diagnosis not present

## 2024-04-03 LAB — LIPID PANEL
Cholesterol: 187 mg/dL (ref 0–200)
HDL: 46.9 mg/dL (ref >39.00)
LDL Cholesterol: 105 mg/dL — ABNORMAL HIGH (ref 0–99)
NonHDL: 140.02
Total CHOL/HDL Ratio: 4
Triglycerides: 175 mg/dL — ABNORMAL HIGH (ref 0.0–149.0)
VLDL: 35 mg/dL (ref 0.0–40.0)

## 2024-04-03 LAB — URIC ACID: Uric Acid, Serum: 11.3 mg/dL — ABNORMAL HIGH (ref 4.0–7.8)

## 2024-04-03 LAB — TSH: TSH: 0.4 u[IU]/mL (ref 0.35–5.50)

## 2024-04-03 LAB — HEMOGLOBIN A1C: Hgb A1c MFr Bld: 6 % (ref 4.6–6.5)

## 2024-04-03 MED ORDER — ALPRAZOLAM 0.5 MG PO TABS: 0.5000 mg | ORAL_TABLET | Freq: Three times a day (TID) | ORAL | 0 refills | Status: DC | PRN

## 2024-04-03 MED ORDER — ALLOPURINOL 300 MG PO TABS: 300.0000 mg | ORAL_TABLET | Freq: Every day | ORAL | 0 refills | Status: DC

## 2024-04-03 NOTE — Patient Instructions (Signed)

## 2024-04-03 NOTE — Progress Notes (Signed)
 Subjective:  Patient ID: Robert Church, male    DOB: Jan 21, 1956  Age: 68 y.o. MRN: [id number]  CC: Hypertension, Hyperlipidemia, Diabetes, and Congestive Heart Failure   HPI Robert Church presents for f/up ----  Discussed the use of AI scribe software for clinical note transcription with the patient, who gave verbal consent to proceed.  History of Present Illness   Robert Church "Robert Church" is a 68 year old male who presents with difficulty sleeping due to breathing issues.  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 This condition developed after a series of respiratory illnesses earlier this year, starting with a cold in January that progressed to pneumonia and RSV. During this period, he also sustained broken ribs.  He experiences difficulty sleeping due to breathing issues, particularly when lying down. The symptoms worsen if he rolls onto the affected side at night, leading him to sleep in a recliner to avoid exacerbating his symptoms. He describes his sleep quality as poor, stating it is 'not the greatest in the world'. No current chest pain, fevers, chills, or night sweats. He has a clear cough but no other significant respiratory symptoms.  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       Outpatient Medications Prior to Visit  Medication Sig Dispense Refill   albuterol (PROVENTIL) (2.5 MG/3ML) 0.083% nebulizer solution TAKE 3 MLS BY NEBULIZATION EVERY 4 HOURS AS NEEDED FOR WHEEZING OR SHORTNESS OF BREATH 150 mL 11   azelastine (ASTELIN) 0.1 % nasal spray Place 2 sprays into both nostrils 2 (two) times daily. Use in each nostril as directed 30 mL 1   baclofen (LIORESAL) 10 MG tablet Take 1 tablet (10 mg total) by mouth 3 (three) times daily. 30 each 0   DALIRESP 500 MCG TABS tablet TAKE 1 TABLET(500 MCG) BY MOUTH DAILY 30 tablet  11   dextromethorphan 15 MG/5ML syrup Take 10 mLs (30 mg total) by mouth 4 (four) times daily as needed for cough. 240 mL 0   diclofenac Sodium (VOLTAREN) 1 % GEL Apply 1 Application topically in the morning and at bedtime.     doxycycline (VIBRA-TABS) 100 MG tablet Take 1 tablet (100 mg total) by mouth 2 (two) times daily. 14 tablet 0   EPINEPHrine 0.3 mg/0.3 mL IJ SOAJ injection Inject 0.3 mg into the muscle as needed for anaphylaxis. 1 each 0   fluticasone (FLONASE) 50 MCG/ACT nasal spray SHAKE LIQUID AND USE 2 SPRAYS IN EACH NOSTRIL DAILY (Patient taking differently: Place 1 spray into both nostrils daily as needed for allergies.) 16 g 2   gabapentin (NEURONTIN) 300 MG capsule Take 1 capsule (300 mg total) by mouth 3 (three) times daily. 90 capsule 4   guaiFENesin (MUCINEX) 600 MG 12 hr tablet Take 1 tablet (600 mg total) by mouth 2 (two) times daily. 60 tablet 5   ipratropium (ATROVENT) 0.06 % nasal spray Place 2 sprays into both nostrils 4 (four) times daily as needed for rhinitis. (Patient taking differently: Place 2 sprays into both nostrils 4 (four) times daily as needed for rhinitis. As needed) 45 mL 3   ipratropium-albuterol (DUONEB) 0.5-2.5 (3) MG/3ML SOLN Take 3 mLs by nebulization every 4 (four) hours as needed. 360 mL 5   loratadine (CLARITIN) 10 MG tablet Take 10 mg by mouth every other day. As  needed     methocarbamol (ROBAXIN) 500 MG tablet Take 1 tablet (500 mg total) by mouth every 8 (eight) hours as needed for muscle spasms. 30 tablet 1   metolazone (ZAROXOLYN) 2.5 MG tablet Take 1 tablet (2.5 mg total) by mouth 2 (two) times a week. Take 2.5 mg on Monday/Friday 8 tablet 11   mometasone-formoterol (DULERA) 100-5 MCG/ACT AERO INHALE 2 PUFFS INTO THE LUNGS TWICE DAILY 13 g 5   montelukast (SINGULAIR) 10 MG tablet TAKE 1 TABLET BY MOUTH EVERY DAY AT BEDTIME 30 tablet 5   omeprazole (PRILOSEC) 20 MG capsule TAKE 1 CAPSULE(20 MG) BY MOUTH TWICE DAILY BEFORE A MEAL FOR STOMACH ACID OR  REFLUX 180 capsule 0   ondansetron (ZOFRAN-ODT) 4 MG disintegrating tablet Take 1 tablet (4 mg total) by mouth every 8 (eight) hours as needed for nausea or vomiting. 20 tablet 0   potassium chloride (KLOR-CON) 20 MEQ packet Take 60 mEq by mouth every morning AND 40 mEq every evening. (Patient taking differently: Take 40 meq in the am ) 150 packet 5   predniSONE (DELTASONE) 10 MG tablet Take 1 tablet (10 mg total) by mouth daily with breakfast. 90 tablet 1   Tiotropium Bromide Monohydrate (SPIRIVA RESPIMAT) 2.5 MCG/ACT AERS Inhale 2 puffs into the lungs daily. 4 g 6   torsemide (DEMADEX) 20 MG tablet Take 2 tablets (40 mg total) by mouth daily. 90 tablet 11   VENTOLIN HFA 108 (90 Base) MCG/ACT inhaler INHALE 2 PUFFS INTO THE LUNGS EVERY 6 HOURS AS NEEDED FOR WHEEZING 18 g 4   vitamin B-12 (CYANOCOBALAMIN) 1000 MCG tablet Take 1,000 mcg by mouth daily.     Vitamin D, Ergocalciferol, (DRISDOL) 1.25 MG (50000 UNIT) CAPS capsule Take 1 capsule (50,000 Units total) by mouth every 7 (seven) days. 8 capsule 0   vitamin E 180 MG (400 UNITS) capsule Take 400 Units by mouth daily.     allopurinol (ZYLOPRIM) 300 MG tablet Take 1 tablet (300 mg total) by mouth daily. 90 tablet 0   ALPRAZolam (XANAX) 0.5 MG tablet TAKE 1 TABLET(0.5 MG) BY MOUTH THREE TIMES DAILY 270 tablet 0   Testosterone 20.25 MG/1.25GM (1.62%) GEL Place 1.25 g onto the skin daily. 112.5 g 0   No facility-administered medications prior to visit.    ROS Review of Systems  Constitutional:  Negative for appetite change, chills, diaphoresis, fatigue and unexpected weight change.  HENT: Negative.    Eyes: Negative.   Respiratory:  Positive for cough and shortness of breath. Negative for chest tightness and wheezing.   Cardiovascular:  Negative for chest pain, palpitations and leg swelling.  Gastrointestinal: Negative.  Negative for abdominal pain, blood in stool, constipation, diarrhea, nausea and vomiting.  Endocrine: Negative.    Genitourinary: Negative.   Musculoskeletal:  Positive for arthralgias and gait problem. Negative for joint swelling and myalgias.  Skin: Negative.   Neurological:  Negative for dizziness, weakness and light-headedness.  Hematological:  Negative for adenopathy. Does not bruise/bleed easily.  Psychiatric/Behavioral:  Positive for confusion, decreased concentration and sleep disturbance. Negative for self-injury and suicidal ideas. The patient is nervous/anxious.     Objective:  BP 116/80 (BP Location: Left Arm, Patient Position: Sitting, Cuff Size: Normal)   Pulse 88   Temp 97.8 F (36.6 C) (Oral)   Resp 16   Ht 5\' 10"  (1.778 m)   Wt 231 lb 11.2 oz (105.1 kg)   SpO2 93%   BMI 33.25 kg/m   BP Readings from Last 3  Encounters:  04/03/24 116/80  03/30/24 123/85  03/24/24 114/80    Wt Readings from Last 3 Encounters:  04/03/24 231 lb 11.2 oz (105.1 kg)  03/30/24 232 lb 6.4 oz (105.4 kg)  03/24/24 227 lb 12.8 oz (103.3 kg)    Physical Exam Vitals reviewed.  Constitutional:      General: He is not in acute distress.    Appearance: He is ill-appearing (in a wheelchair). He is not toxic-appearing or diaphoretic.  HENT:     Nose: Nose normal.     Mouth/Throat:     Mouth: Mucous membranes are moist.  Eyes:     General: No scleral icterus.    Conjunctiva/sclera: Conjunctivae normal.  Cardiovascular:     Rate and Rhythm: Normal rate and regular rhythm.     Heart sounds: No murmur heard.    No friction rub. No gallop.  Pulmonary:     Breath sounds: Examination of the right-middle field reveals rhonchi. Examination of the left-middle field reveals rhonchi. Examination of the right-lower field reveals rhonchi. Examination of the left-lower field reveals rhonchi. Rhonchi present. No decreased breath sounds, wheezing or rales.  Abdominal:     General: Abdomen is protuberant. Bowel sounds are normal. There is no distension.     Palpations: Abdomen is soft. There is no fluid wave,  hepatomegaly, splenomegaly or mass.     Tenderness: There is no abdominal tenderness.  Musculoskeletal:        General: Normal range of motion.     Cervical back: Neck supple.     Right lower leg: 1+ Pitting Edema present.     Left lower leg: 1+ Pitting Edema present.  Lymphadenopathy:     Cervical: No cervical adenopathy.  Skin:    General: Skin is warm and dry.  Neurological:     General: No focal deficit present.     Mental Status: He is alert.  Psychiatric:        Mood and Affect: Mood normal.        Behavior: Behavior normal.     Lab Results  Component Value Date   WBC 8.4 01/07/2024   HGB 14.1 01/07/2024   HCT 42.0 01/07/2024   PLT 279.0 01/07/2024   GLUCOSE 119 (H) 03/02/2024   CHOL 187 04/03/2024   TRIG 175.0 (H) 04/03/2024   HDL 46.90 04/03/2024   LDLDIRECT 157.0 09/15/2022   LDLCALC 105 (H) 04/03/2024   ALT 14 03/02/2024   AST 17 03/02/2024   NA 141 03/02/2024   K 3.8 03/02/2024   CL 97 03/02/2024   CREATININE 0.91 03/02/2024   BUN 11 03/02/2024   CO2 34 (H) 03/02/2024   TSH 0.40 04/03/2024   PSA 3.62 11/09/2023   INR 1.0 11/09/2023   HGBA1C 6.0 04/03/2024   MICROALBUR <0.7 11/09/2023    CT Chest Wo Contrast Result Date: 03/19/2024 CLINICAL DATA:  Rib fracture, pulmonary herniation EXAM: CT CHEST WITHOUT CONTRAST TECHNIQUE: Multidetector CT imaging of the chest was performed following the standard protocol without IV contrast. RADIATION DOSE REDUCTION: This exam was performed according to the departmental dose-optimization program which includes automated exposure control, adjustment of the mA and/or kV according to patient size and/or use of iterative reconstruction technique. COMPARISON:  02/11/2024 FINDINGS: Cardiovascular: Cardiac size is mildly enlarged with particular left ventricular enlargement. Left subclavian pacemaker leads are in place extending into the right atrium and right ventricle. Moderate coronary artery calcification. No pericardial  effusion. Central pulmonary arteries are stably enlarged in keeping with changes of  pulmonary arterial hypertension. Moderate atherosclerotic calcification within the thoracic aorta. No aortic aneurysm. Mediastinum/Nodes: No enlarged mediastinal or axillary lymph nodes. Thyroid gland, trachea, and esophagus demonstrate no significant findings. Lungs/Pleura: Similar prior examination, there is a subacute fracture of the left ninth rib at the costotransverse junction with inferior and lateral displacement of the distal aspect the ninth rib. This results in a widened inter costal space through which there is partial herniation of the lateral aspect of the inferior lingula and left lower lobe. This appears similar to prior examination. Previously noted left pleural effusion has resolved. No pneumothorax. Mild emphysema. No central obstructing lesion. Upper Abdomen: No acute abnormality. Musculoskeletal: Right anterior chest wall neurostimulator battery pack partially visualized. No acute bone abnormality. No lytic or blastic lesion. IMPRESSION: 1. Subacute fracture of the left ninth rib at the costotransverse junction with inferior and lateral displacement of the distal aspect the ninth rib. This results in a widened inter costal space through which there is partial herniation of the lateral aspect of the inferior lingula and left lower lobe. This appears similar to prior examination. Previously noted left pleural effusion has resolved. No pneumothorax. 2. Mild cardiomegaly. Moderate coronary artery calcification. 3. Morphologic changes in keeping with pulmonary arterial hypertension. Aortic Atherosclerosis (ICD10-I70.0) and Emphysema (ICD10-J43.9). Electronically Signed   By: Worthy Heads M.D.   On: 03/19/2024 23:23    Assessment & Plan:   Type II diabetes mellitus with manifestations (HCC)- His blood sugar is well controlled. 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 -     Uric acid; Future -     Allopurinol; Take 1 tablet (300 mg total) by mouth daily.  Dispense: 90 tablet; Refill: 0  Chronic respiratory failure with hypoxia (HCC)- Well compensated.  GAD (generalized anxiety disorder) -     ALPRAZolam; Take 1 tablet (0.5 mg total) by mouth 3 (three) times daily as needed for anxiety.  Dispense: 270 tablet; Refill: 0  Chronic tophaceous gout -     Allopurinol; Take 1 tablet (300 mg total) by mouth daily.  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     Follow-up: Return in about 4 months (around 08/03/2024).  Sandra Crouch, MD

## 2024-04-04 ENCOUNTER — Encounter: Payer: Self-pay | Admitting: Internal Medicine

## 2024-04-18 ENCOUNTER — Telehealth: Payer: Self-pay

## 2024-04-18 NOTE — Telephone Encounter (Signed)
 Received Ohtuvayre new start paperwork. Completed form and faxed with clinicals and insurance card copy to [facility]   Phone#: [phone] Fax#: [phone]

## 2024-04-25 ENCOUNTER — Ambulatory Visit: Admitting: Internal Medicine

## 2024-04-26 DIAGNOSIS — J449 Chronic obstructive pulmonary disease, unspecified: Secondary | ICD-10-CM | POA: Diagnosis not present

## 2024-04-26 DIAGNOSIS — J9610 Chronic respiratory failure, unspecified whether with hypoxia or hypercapnia: Secondary | ICD-10-CM | POA: Diagnosis not present

## 2024-04-27 DIAGNOSIS — J449 Chronic obstructive pulmonary disease, unspecified: Secondary | ICD-10-CM | POA: Diagnosis not present

## 2024-04-28 ENCOUNTER — Other Ambulatory Visit: Payer: Self-pay | Admitting: Internal Medicine

## 2024-04-28 ENCOUNTER — Telehealth: Payer: Self-pay | Admitting: Internal Medicine

## 2024-04-28 NOTE — Telephone Encounter (Signed)
 Copied from CRM [id number]. Topic: General - Other >> Apr 28, 2024  3:14 PM Annelle Kiel wrote: Reason for CRM: christine is calling in regarding patient power wheel chair documents where sent in on may 02 callback number [phone]   power ext [phone]

## 2024-04-28 NOTE — Telephone Encounter (Signed)
 She is going to refax the forms.

## 2024-04-29 ENCOUNTER — Other Ambulatory Visit: Payer: Self-pay | Admitting: Family Medicine

## 2024-04-29 DIAGNOSIS — E559 Vitamin D deficiency, unspecified: Secondary | ICD-10-CM

## 2024-05-03 ENCOUNTER — Telehealth: Admitting: Emergency Medicine

## 2024-05-03 ENCOUNTER — Ambulatory Visit: Admitting: Emergency Medicine

## 2024-05-03 ENCOUNTER — Encounter: Payer: Self-pay | Admitting: Emergency Medicine

## 2024-05-03 DIAGNOSIS — J449 Chronic obstructive pulmonary disease, unspecified: Secondary | ICD-10-CM | POA: Diagnosis not present

## 2024-05-03 NOTE — Patient Instructions (Addendum)
 Unable to get his Hulan Maffucci, working on this with pharmacy to get patient assistance.  No other changes in regimen for now Continue ventilator at night ROV 2 months

## 2024-05-03 NOTE — Progress Notes (Signed)
 Virtual Visit via Video Note  I connected with Robert Church on 05/03/24 at  2:00 PM EDT by a video enabled telemedicine application and verified that I am speaking with the correct person using two identifiers.  Location: Patient: Home Provider: Office   I discussed the limitations of evaluation and management by telemedicine and the availability of in person appointments. The patient expressed understanding and agreed to proceed.  History of Present Illness: 68 year old man with multifactorial chronic hypoxemic respiratory failure requiring a trilogy ventilator.  This is due to very severe COPD (continued tobacco use), ischemic cardiomyopathy, obesity hypoventilation syndrome.  He also has a history of rib fractures with herniation of the left lung, has seen thoracic surgery and no real surgical option available   Observations/Objective: Has been managed on prednisone  10 mg, Dulera , Spiriva , Daliresp , guaifenesin , Astelin , Flonase , Singulair . I saw him 1 month ago and plans to start him on Ohtuvayre  to see if he would get benefit. He is still working to get patient assistance, has not started the medication yet.  He has had some SOB after the spiriva  - ? Cause. He has tried taking a break from the spiriva . Very limited w his functional capacity. Unable to do as much - no chores or housework.   Sleeping w his vent reliably + O2 bled in. Has not seen any desats.   Assessment and Plan: Quite debilitated, needs to continue to work on oxygen  compliance during the day.  He does wear his ventilator with oxygen  bled in at night.  Continue same. Continue his current regimen (Dulera , Spiriva , Daliresp , guaifenesin , prednisone  10 mg), certainly will need to be back on LAMA, have encouraged him to restart the Spiriva . Hopefully he can start Ohtuvayre  so we can see if he gets benefit  Follow Up Instructions: 2 months   I discussed the assessment and treatment plan with the patient. The patient was  provided an opportunity to ask questions and all were answered. The patient agreed with the plan and demonstrated an understanding of the instructions.   The patient was advised to call back or seek an in-person evaluation if the symptoms worsen or if the condition fails to improve as anticipated.  I provided 30 minutes minutes of non-face-to-face time during this encounter.   Denson Flake, MD

## 2024-05-04 ENCOUNTER — Ambulatory Visit: Admitting: Physician Assistant

## 2024-05-08 ENCOUNTER — Other Ambulatory Visit: Payer: Self-pay | Admitting: Adult Health

## 2024-05-09 ENCOUNTER — Ambulatory Visit: Admitting: Physician Assistant

## 2024-05-09 NOTE — Telephone Encounter (Signed)
 Received a fax from  [employer] regarding an approval for OHTUVAYRE  patient assistance from 05/09/2024 to 12/20/2024. Approval letter sent to scan center.  Phone#: [phone] Fax#: [phone]  Geraldene Kleine, PharmD, MPH, BCPS, CPP Clinical Pharmacist (Rheumatology and Pulmonology)

## 2024-05-10 ENCOUNTER — Telehealth (HOSPITAL_BASED_OUTPATIENT_CLINIC_OR_DEPARTMENT_OTHER): Payer: Self-pay

## 2024-05-10 DIAGNOSIS — J441 Chronic obstructive pulmonary disease with (acute) exacerbation: Secondary | ICD-10-CM

## 2024-05-10 NOTE — Telephone Encounter (Signed)
 Copied from CRM [id number]. Topic: Clinical - Medication Question >> May 09, 2024  4:32 PM Justina Oman C wrote: Reason for CRM: Sema from [facility] pharmacy [phone] option 5 received 02Ohtuvayre prescription and need the patient's assistance information. Please advise and call back.

## 2024-05-11 ENCOUNTER — Ambulatory Visit: Admitting: Physician Assistant

## 2024-05-12 MED ORDER — OHTUVAYRE 3 MG/2.5ML IN SUSP: 3.0000 mg | Freq: Two times a day (BID) | RESPIRATORY_TRACT | 5 refills | Status: DC

## 2024-05-12 NOTE — Addendum Note (Signed)
 Addended by: Thais Fill on: 05/12/2024 01:23 PM   Modules accepted: Orders

## 2024-05-16 ENCOUNTER — Ambulatory Visit (INDEPENDENT_AMBULATORY_CARE_PROVIDER_SITE_OTHER): Payer: Medicare Other

## 2024-05-16 DIAGNOSIS — I428 Other cardiomyopathies: Secondary | ICD-10-CM

## 2024-05-16 DIAGNOSIS — I442 Atrioventricular block, complete: Secondary | ICD-10-CM | POA: Diagnosis not present

## 2024-05-17 ENCOUNTER — Telehealth: Payer: Self-pay | Admitting: Cardiology

## 2024-05-17 LAB — CUP PACEART REMOTE DEVICE CHECK
Battery Remaining Longevity: 73 mo
Battery Remaining Percentage: 76 %
Battery Voltage: 2.98 V
Brady Statistic AP VP Percent: 1 %
Brady Statistic AP VS Percent: 1 %
Brady Statistic AS VP Percent: 98 %
Brady Statistic AS VS Percent: 1 %
Brady Statistic RA Percent Paced: 1 %
Date Time Interrogation Session: 20250528113315
Implantable Lead Connection Status: 753985
Implantable Lead Connection Status: 753985
Implantable Lead Connection Status: 753985
Implantable Lead Implant Date: 20220427
Implantable Lead Implant Date: 20220427
Implantable Lead Implant Date: 20231122
Implantable Lead Location: 753858
Implantable Lead Location: 753859
Implantable Lead Location: 753860
Implantable Pulse Generator Implant Date: 20231122
Lead Channel Impedance Value: 490 Ohm
Lead Channel Impedance Value: 510 Ohm
Lead Channel Impedance Value: 640 Ohm
Lead Channel Pacing Threshold Amplitude: 0.625 V
Lead Channel Pacing Threshold Amplitude: 0.75 V
Lead Channel Pacing Threshold Amplitude: 0.875 V
Lead Channel Pacing Threshold Pulse Width: 0.5 ms
Lead Channel Pacing Threshold Pulse Width: 0.5 ms
Lead Channel Pacing Threshold Pulse Width: 0.5 ms
Lead Channel Sensing Intrinsic Amplitude: 12 mV
Lead Channel Sensing Intrinsic Amplitude: 3.1 mV
Lead Channel Setting Pacing Amplitude: 1.75 V
Lead Channel Setting Pacing Amplitude: 2 V
Lead Channel Setting Pacing Amplitude: 2 V
Lead Channel Setting Pacing Pulse Width: 0.5 ms
Lead Channel Setting Pacing Pulse Width: 0.5 ms
Lead Channel Setting Sensing Sensitivity: 2 mV
Pulse Gen Model: 3222
Pulse Gen Serial Number: 3979797

## 2024-05-17 NOTE — Telephone Encounter (Signed)
  1. Has your device fired? No  2. Is you device beeping? no  3. Are you experiencing draining or swelling at device site? no  4. Are you calling to see if we received your device transmission? Pt received mychart message that we did not receive his transmission  5. Have you passed out? no   Please route to Device Clinic Pool

## 2024-05-18 ENCOUNTER — Telehealth: Payer: Self-pay

## 2024-05-18 NOTE — Telephone Encounter (Signed)
 Copied from CRM [id number]. Topic: Clinical - Prescription Issue >> May 18, 2024  3:03 PM Evie Hoff wrote: Reason for CRM: chasity from [facility] is calling with [facility] akila on the phone concerning a prescription . We have the prescription for patient .patient has been approved tas loan for the Ensifentrine  (OHTUVAYRE ) 3 MG/2.5ML SUSP Akila from [facility] they have received the paperwork and to see if they have the processing information for patient  Member id Vin number Group number  Pcn number   [id number] [facility] number

## 2024-05-18 NOTE — Telephone Encounter (Signed)
   Patient Name: Robert Church  DOB: 1956/10/31 MRN: [id number]  Primary Cardiologist: Lauro Portal, MD  Chart reviewed as part of pre-operative protocol coverage.   Simple dental extractions (i.e. 1-2 teeth), cleanings are considered low risk procedures per guidelines and generally do not require any specific cardiac clearance. It is also generally accepted that for simple extractions and dental cleanings, there is no need to interrupt blood thinner therapy.   SBE prophylaxis is not required for the patient from a cardiac standpoint.  I will route this recommendation to the requesting party via Epic fax function and remove from pre-op  pool.  Please call with questions.  Jude Norton, NP 05/18/2024, 11:07 AM

## 2024-05-18 NOTE — Telephone Encounter (Signed)
   Pre-operative Risk Assessment    Patient Name: Robert Church  DOB: 08/02/1956 MRN: [id number]   Date of last office visit: 03/24/24 with Mertha Abrahams  Date of next office visit: 06/02/24 with Michaelle Adolphus   Request for Surgical Clearance    Procedure:  Deep Dental Cleaning  Date of Surgery:  Clearance TBD                                Surgeon:  Not Indicated Surgeon's Group or Practice Name:  [facility] Phone number:  [phone] Fax number:  [phone]   Type of Clearance Requested:   - Medical    Type of Anesthesia:  Local with Epinephrine    Additional requests/questions:    Kenny Peals   05/18/2024, 10:07 AM

## 2024-05-19 ENCOUNTER — Ambulatory Visit: Admitting: Adult Health

## 2024-05-21 ENCOUNTER — Ambulatory Visit: Payer: Self-pay | Admitting: Cardiology

## 2024-05-22 ENCOUNTER — Telehealth: Payer: Self-pay | Admitting: Internal Medicine

## 2024-05-22 ENCOUNTER — Encounter: Payer: Self-pay | Admitting: Emergency Medicine

## 2024-05-22 ENCOUNTER — Telehealth: Payer: Self-pay

## 2024-05-22 NOTE — Telephone Encounter (Signed)
 Called to confirm/remind patient of their appointment [facility] on [facility].   Appointment:   [x] Confirmed  [] Left mess   [] No answer/No voice mail  [] VM Full/unable to leave message  [] Phone not in service  Patient reminded to bring all medications and/or complete list.  Confirmed patient has transportation. Gave directions, instructed to utilize valet parking.

## 2024-05-22 NOTE — Telephone Encounter (Signed)
 Copied from CRM [id number]. Topic: Clinical - Medication Question >> May 19, 2024  4:08 PM Robert Church wrote: Reason for CRM: Patient called regarding Ensifentrine  (OHTUVAYRE ) 3 MG/2.5ML SUSP to inquire if he can continue taking all his medications together or if he needs to stop taking any of his other medications.  Please advise

## 2024-05-22 NOTE — Telephone Encounter (Signed)
 The processing information for [facility]'s patient assistance program is not something that we can provide, I believe this information should have already been provided by [employer] at the time the pt was approved for PAP. I have reached out to Camilo Cella [facility] for Ohtuvayre ) for assistance with this matter.

## 2024-05-22 NOTE — Telephone Encounter (Addendum)
 Addressed in MyChart message: The only medication that would interact with Ohtuvayre  is roflumilast  (Daliresp ) which should be discontinued. However the remaining COPD treatments should be continued as below: Continue your Dulera  and Spiriva  Continue prednisone  10 mg once daily Continue Singulair  10 mg each evening Continue guaifenesin  (Mucinex ) as you have been taking it

## 2024-05-22 NOTE — Telephone Encounter (Signed)
 I agree with this plan - stop the daliresp , continue everything else, and we will see how the breathing responds

## 2024-05-23 ENCOUNTER — Ambulatory Visit: Attending: Internal Medicine | Admitting: Internal Medicine

## 2024-05-23 ENCOUNTER — Encounter: Payer: Self-pay | Admitting: Internal Medicine

## 2024-05-23 ENCOUNTER — Telehealth: Payer: Self-pay | Admitting: *Deleted

## 2024-05-23 VITALS — BP 123/84 | HR 84 | Wt 232.4 lb

## 2024-05-23 DIAGNOSIS — J449 Chronic obstructive pulmonary disease, unspecified: Secondary | ICD-10-CM | POA: Diagnosis not present

## 2024-05-23 DIAGNOSIS — Z79899 Other long term (current) drug therapy: Secondary | ICD-10-CM | POA: Diagnosis not present

## 2024-05-23 DIAGNOSIS — I5022 Chronic systolic (congestive) heart failure: Secondary | ICD-10-CM | POA: Diagnosis not present

## 2024-05-23 DIAGNOSIS — E669 Obesity, unspecified: Secondary | ICD-10-CM | POA: Diagnosis not present

## 2024-05-23 DIAGNOSIS — Z720 Tobacco use: Secondary | ICD-10-CM

## 2024-05-23 DIAGNOSIS — G4733 Obstructive sleep apnea (adult) (pediatric): Secondary | ICD-10-CM | POA: Diagnosis not present

## 2024-05-23 DIAGNOSIS — I5042 Chronic combined systolic (congestive) and diastolic (congestive) heart failure: Secondary | ICD-10-CM | POA: Diagnosis not present

## 2024-05-23 DIAGNOSIS — F1090 Alcohol use, unspecified, uncomplicated: Secondary | ICD-10-CM | POA: Diagnosis not present

## 2024-05-23 DIAGNOSIS — Z950 Presence of cardiac pacemaker: Secondary | ICD-10-CM

## 2024-05-23 DIAGNOSIS — Z5986 Financial insecurity: Secondary | ICD-10-CM | POA: Diagnosis not present

## 2024-05-23 DIAGNOSIS — F17210 Nicotine dependence, cigarettes, uncomplicated: Secondary | ICD-10-CM | POA: Insufficient documentation

## 2024-05-23 DIAGNOSIS — Z86718 Personal history of other venous thrombosis and embolism: Secondary | ICD-10-CM | POA: Diagnosis not present

## 2024-05-23 DIAGNOSIS — I428 Other cardiomyopathies: Secondary | ICD-10-CM | POA: Diagnosis not present

## 2024-05-23 DIAGNOSIS — K449 Diaphragmatic hernia without obstruction or gangrene: Secondary | ICD-10-CM | POA: Diagnosis not present

## 2024-05-23 DIAGNOSIS — I110 Hypertensive heart disease with heart failure: Secondary | ICD-10-CM | POA: Diagnosis not present

## 2024-05-23 DIAGNOSIS — G8929 Other chronic pain: Secondary | ICD-10-CM | POA: Diagnosis not present

## 2024-05-23 DIAGNOSIS — Z7901 Long term (current) use of anticoagulants: Secondary | ICD-10-CM | POA: Insufficient documentation

## 2024-05-23 DIAGNOSIS — I10 Essential (primary) hypertension: Secondary | ICD-10-CM

## 2024-05-23 NOTE — Telephone Encounter (Signed)
 Per Technical brewer, Camilo Cella, on 05/22/2024: I received an update from [employer].  The patient received Ohtuvayre  from [facility] on 05/12/24, according to [employer] tracking.  [facility] is unsure why [facility] would be requesting PAP info from you,  but you should be okay not responding since the patient received medication.   Called [facility] to cancel follow-up and discontinue prescription.  Phone: [phone]  Geraldene Kleine, PharmD, MPH, BCPS, CPP Clinical Pharmacist (Rheumatology and Pulmonology)

## 2024-05-23 NOTE — Progress Notes (Signed)
 ADV[facility]ED [facility] CLINIC NOTE  HF MD: Dr. Julane Ny  Pulmonology: Dr Baldwin Levee   Chief Complaint:  Heart Failure   HPI: Robert Church is a 68 y.o. male with h/o obesity, HTN, COPD with ongoing tobacco use and chronic systolic heart failure.    Cath 03/2017 - normal coronaries with EF 40% and global HK.  He saw Dr. Katheryne Pane on 07/01/2018 because of recurrent chest pain.  Myoview  stress test performed 07/21/2018 showed EF 38% inferior scar with septal ischemia.  Echo 06/2018  EF of 30 to 35%.  He was referred for cardiac CT.   Admitted 1/22 for acute on chronic hypoxic respiratory failure secondary to acute COPD and CHF w/ volume overload. He had massive diuresis w/ IV Lasix , diuresed down from admit wt of 265 lb to 210 lb. Echo 12/21, EF 40-45% (unchanged). RV mildly reduced, RVSP 49.    In 4/22, had near syncope, Zio showed intermittent CHB. Repeat echo EF 30-35%  Had St Jude dual chamber PM 04/16/21. Had some PMT and device adjusted by EP .   Underwent R/L cath in 9/22 for persistent SOB. No CAD. EF 35% Mildly elevated filling pressures with normal output.   Subsequently underwent CRT-D upgrade attempt on 10/07/21 by Dr. Lawana Pray due to high-degree of RV pacing. 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   S/p Barostim 2/23  S/p BiV upgrade 11/11/22. C/b LYE DVT and started on Eliquis .   Returns to clinic today for HF f/u. Had severe URI earlier this year with severe coughing spell associated with rib/costochondral fracture leading to chest wall hernia with partial herniation of the lateral aspect of the inferior lingula and left lower lobe. Seen by TCTS. Not surgical candidate.   Says he feels miserable. Now using a wheelchair. Remains very SOB. Has to sleep in recliner. Edema up and down but relatively well controlled with torsemide  40 daily and metolazone  M/F. Still smoking a few cis per day. Drinking 8-10 beers/day    Wt Readings from Last 3 Encounters:  05/23/24 232 lb 6.4  oz (105.4 kg)  04/03/24 231 lb 11.2 oz (105.1 kg)  03/30/24 232 lb 6.4 oz (105.4 kg)      Cardiac studies: Echo 06/2018 LVEF 30-35%, RV normal  Echo 12/2018 LVEF 40-45%, RV normal  Echo 11/2020 LVEF 40-45%, RV mildly reduced Echo 03/2021 30-35%, RV mildly reduced  Echo 07/2021 EF 30-35% RV moderately reduced  Echo 01/13/23 EF < 20%, RV mildly down  RHC 3/24 RA = 7 RV = 37/11 PA = 39/20 (28) PCW = 13 Fick cardiac output/index = 5.2/2.3 PVR = 2.9 WU FA sat = 91% PA sat = 63% Review of systems complete and found to be negative unless listed in HPI.    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   Requires supplemental oxygen      Current Outpatient Medications  Medication Sig Dispense  Refill   albuterol  (PROVENTIL ) (2.5 MG/3ML) 0.083% nebulizer solution TAKE 3 MLS BY NEBULIZATION EVERY 4 HOURS AS NEEDED FOR WHEEZING OR SHORTNESS OF BREATH 150 mL 11   allopurinol  (ZYLOPRIM ) 300 MG tablet Take 1 tablet (300 mg total) by mouth daily. 90 tablet 0   ALPRAZolam  (XANAX ) 0.5 MG tablet Take 1 tablet (0.5 mg total) by mouth 3 (three) times daily as needed for anxiety. 270 tablet 0   azelastine  (ASTELIN ) 0.1 % nasal spray Place 2 sprays into both nostrils 2 (two) times daily. Use in each nostril as directed 30 mL 1   baclofen  (LIORESAL ) 10 MG tablet Take 1 tablet (10 mg total) by mouth 3 (three) times daily. 30 each 0   diclofenac Sodium (VOLTAREN) 1 % GEL Apply 1 Application topically in the morning and at bedtime.     EPINEPHrine  0.3 mg/0.3  mL IJ SOAJ injection Inject 0.3 mg into the muscle as needed for anaphylaxis. 1 each 0   fluticasone  (FLONASE ) 50 MCG/ACT nasal spray SHAKE LIQUID AND USE 2 SPRAYS IN EACH NOSTRIL DAILY (Patient taking differently: Place 1 spray into both nostrils daily as needed for allergies.) 16 g 2   guaiFENesin  (MUCINEX ) 600 MG 12 hr tablet Take 1 tablet (600 mg total) by mouth 2 (two) times daily. 60 tablet 5   ipratropium (ATROVENT ) 0.06 % nasal spray Place 2 sprays into both nostrils 4 (four) times daily as needed for rhinitis. (Patient taking differently: Place 2 sprays into both nostrils 4 (four) times daily as needed for rhinitis. As needed) 45 mL 3   ipratropium-albuterol  (DUONEB) 0.5-2.5 (3) MG/3ML SOLN Take 3 mLs by nebulization every 4 (four) hours as needed. 360 mL 5   loratadine (CLARITIN) 10 MG tablet Take 10 mg by mouth every other day. As needed     methocarbamol  (ROBAXIN ) 500 MG tablet Take 1 tablet (500 mg total) by mouth every 8 (eight) hours as needed for muscle spasms. 30 tablet 1   metolazone  (ZAROXOLYN ) 2.5 MG tablet Take 1 tablet (2.5 mg total) by mouth 2 (two) times a week. Take 2.5 mg on Monday/Friday 8 tablet 11   mometasone -formoterol  (DULERA ) 100-5 MCG/ACT AERO INHALE 2 PUFFS INTO THE LUNGS TWICE DAILY 13 g 5   montelukast  (SINGULAIR ) 10 MG tablet TAKE 1 TABLET BY MOUTH EVERY DAY AT BEDTIME 30 tablet 5   omeprazole  (PRILOSEC) 20 MG capsule TAKE 1 CAPSULE(20 MG) BY MOUTH TWICE DAILY BEFORE A MEAL FOR STOMACH ACID OR REFLUX 180 capsule 0   ondansetron  (ZOFRAN -ODT) 4 MG disintegrating tablet Take 1 tablet (4 mg total) by mouth every 8 (eight) hours as needed for nausea or vomiting. 20 tablet 0   potassium chloride  SA (KLOR-CON  M) 20 MEQ tablet Take 40 mEq by mouth 2 (two) times daily.     predniSONE  (DELTASONE ) 10 MG tablet Take 1 tablet (10 mg total) by mouth daily with breakfast. 90 tablet 1   Tiotropium Bromide  Monohydrate (SPIRIVA  RESPIMAT) 2.5 MCG/ACT AERS Inhale 2 puffs into the  lungs daily. 4 g 6   torsemide  (DEMADEX ) 20 MG tablet Take 2 tablets (40 mg total) by mouth daily. 90 tablet 11   VENTOLIN  HFA 108 (90 Base) MCG/ACT inhaler INHALE 2 PUFFS INTO THE LUNGS EVERY 6 HOURS AS NEEDED FOR WHEEZING 18 g 4   vitamin B-12 (CYANOCOBALAMIN ) 1000 MCG tablet Take 1,000 mcg by mouth daily.     Vitamin D , Ergocalciferol , (DRISDOL ) 1.25 MG (50000 UNIT) CAPS capsule Take 1 capsule (50,000 Units total) by mouth  every 7 (seven) days. 8 capsule 0   vitamin E 180 MG (400 UNITS) capsule Take 400 Units by mouth daily.     Ensifentrine  (OHTUVAYRE ) 3 MG/2.5ML SUSP Inhale 3 mg into the lungs 2 (two) times daily. 150 mL 5   No current facility-administered medications for this visit.   Allergies  Allergen Reactions   Bee Venom Shortness Of Breath and Swelling   Cefdinir  Other (See Comments)    Patient states he couldn't really breath    Spironolactone  Anaphylaxis   Bactrim  [Sulfamethoxazole -Trimethoprim ] Other (See Comments)    Mouth burning   Daliresp  [Roflumilast ] Other (See Comments)    Dizzy, headache, leg pain per patient. 02/25/22. Tolerates in low doses    Jardiance  [Empagliflozin ] Nausea Only and Other (See Comments)    Lightheadness Dizziness   Penicillins Swelling and Other (See Comments)    Childhood rxn--MD stated he "almost died" Has patient had a PCN reaction causing immediate rash, facial/tongue/throat swelling, SOB or lightheadedness with hypotension:Yes Has patient had a PCN reaction causing severe rash involving mucus membranes or skin necrosis:unsure Has patient had a PCN reaction that required hospitalization:unsure Has patient had a PCN reaction occurring within the last 10 years:No If all of the above answers are "NO", then may proceed with Cephalosporin use.     Clindamycin Other (See Comments)    Tongue swelling   Doxycycline  Nausea And Vomiting    Severe stomach upset per patient   E-Mycin [Erythromycin Base] Swelling   Rosuvastatin Other (See  Comments)    Myalgias (intolerance)   Social History   Socioeconomic History   Marital status: Single    Spouse name: Not on file   Number of children: 1   Years of education: 19   Highest education level: 12th grade  Occupational History   Occupation: disabled  Tobacco Use   Smoking status: Former    Current packs/day: 0.00    Average packs/day: 2.0 packs/day for 46.0 years (92.0 ttl pk-yrs)    Types: Cigarettes    Start date: 01/28/1976    Quit date: 01/27/2022    Years since quitting: 2.3    Passive exposure: Past   Smokeless tobacco: Never   Tobacco comments:    Chews on a cigar.  09/16/2023 hfb        Smokes one occasionally per pt. 03/30/24  Vaping Use   Vaping status: Never Used  Substance and Sexual Activity   Alcohol use: Not Currently    Alcohol/week: 28.0 standard drinks of alcohol    Types: 28 Cans of beer per week   Drug use: No   Sexual activity: Yes    Partners: Female    Birth control/protection: None  Other Topics Concern   Not on file  Social History Narrative   Right handed   Tea sometimes and coffee (1/2 caffeine)   Lives alone-2025   Social Drivers of Health   Financial Resource Strain: Medium Risk (01/12/2024)   Overall Financial Resource Strain (CARDIA)    Difficulty of Paying Living Expenses: Somewhat hard  Food Insecurity: No Food Insecurity (01/12/2024)   Hunger Vital Sign    Worried About Running Out of Food in the Last Year: Never true    Ran Out of Food in the Last Year: Never true  Recent Concern: Food Insecurity - Food Insecurity Present (12/19/2023)   Hunger Vital Sign    Worried About Running Out of Food in the Last Year: Sometimes true    Ran Out of Food in the  Last Year: Sometimes true  Transportation Needs: No Transportation Needs (01/12/2024)   PRAPARE - Administrator, Civil Service (Medical): No    Lack of Transportation (Non-Medical): No  Physical Activity: Inactive (01/12/2024)   Exercise Vital Sign    Days of  Exercise per Week: 0 days    Minutes of Exercise per Session: 0 min  Stress: No Stress Concern Present (01/12/2024)   [employer] of Occupational Health - Occupational Stress Questionnaire    Feeling of Stress : Not at all  Recent Concern: Stress - Stress Concern Present (11/04/2023)   [employer] of Occupational Health - Occupational Stress Questionnaire    Feeling of Stress : Rather much  Social Connections: Socially Isolated (01/12/2024)   Social Connection and Isolation Panel [NHANES]    Frequency of Communication with Friends and Family: More than three times a week    Frequency of Social Gatherings with Friends and Family: Three times a week    Attends Religious Services: Never    Active Member of Clubs or Organizations: No    Attends Banker Meetings: Never    Marital Status: Divorced  Catering manager Violence: Not At Risk (06/02/2023)   Humiliation, Afraid, Rape, and Kick questionnaire    Fear of Current or Ex-Partner: No    Emotionally Abused: No    Physically Abused: No    Sexually Abused: No   Family History  Problem Relation Age of Onset   Lung cancer Father    High blood pressure Mother    Diabetes Maternal Grandmother    Colon polyps Neg Hx    Esophageal cancer Neg Hx    Pancreatic cancer Neg Hx    Stomach cancer Neg Hx    Wt Readings from Last 3 Encounters:  05/23/24 232 lb 6.4 oz (105.4 kg)  04/03/24 231 lb 11.2 oz (105.1 kg)  03/30/24 232 lb 6.4 oz (105.4 kg)   BP 123/84   Pulse 84   Wt 232 lb 6.4 oz (105.4 kg)   SpO2 92%   BMI 33.35 kg/m  Wt Readings from Last 3 Encounters:  05/23/24 232 lb 6.4 oz (105.4 kg)  04/03/24 231 lb 11.2 oz (105.1 kg)  03/30/24 232 lb 6.4 oz (105.4 kg)   PHYSICAL EXAM: General:  Obese. No resp difficulty HEENT: normal Neck: supple. no JVD. Carotids 2+ bilat; no bruits. No lymphadenopathy or thryomegaly appreciated. Cor: PMI nondisplaced. Regular rate & rhythm. No rubs, gallops or murmurs. Lungs:  Decreased coarse  Abdomen: obese soft, nontender, nondistended. No hepatosplenomegaly. No bruits or masses. Good bowel sounds. Extremities: no cyanosis, clubbing, rash, edema Neuro: alert & orientedx3, cranial nerves grossly intact. moves all 4 extremities w/o difficulty. Affect pleasant  ICD interrogation: No AF/VT. > 99% BiVpacing. Volume ok but trending up Personally reviewed  ASSESSMENT & PLAN: 1. Chronic systolic HF due to NICM  - Suspect NICM due to HTN and inadequately treated OSA (He now reports improved nightly compliance w/ CPAP). - Echo 1/22 EF 40-45%. RV mildly reduced. RVSP 49 - Echo 07/31/21 EF 30-35 % and RV moderately reduced.  - R/LHC 9/22 No CAD EF 35%, Mildly elevated filling pressures with normal output -BiV upgrade 11/11/22. C/b LYE DVT and started on Eliquis .  - s/p Barostim. - Echo 1/24 EF < 20% - RHC 3/24  RA 7 PA 39/20 (28) PCW 13 Fick 5.2/2.3 - Echo 6/24 EF 35-40% RV low normal  (EF improving with CRT) - NYHA Class IIIb, confounded by COPD + chest  wall hernia + obesity + chronic pain - Volume status trending up mildly. Continue torsemide  60 mg daily and metolazone  twice a week. Mondays + Fridays - GDMT has been limited due to multiple side effects  - Intolerant losartan  due to dizziness  - No spiro (H/o Anaphylaxis).  - Intolerant to Jardiance  (positional dizziness and nausea).  - Intolerant bisoprolol  due to fatigue, coreg  due to dizziness, and toprol  due to dry mouth.  - Focus has been on tight volume management and CRT - Check labs today  2.  HTN - Blood pressure well controlled. Continue current regimen. - labs today   3. OSA - compliant w/ CPAP   4. Chest wall hernia - has seen TCTS. Not surgical candidate  5. Tobacco and ETOH use - encouraged cessation  Robert Wah, MD 2:25 PM

## 2024-05-23 NOTE — Telephone Encounter (Signed)
 Copied from CRM [id number]. Topic: General - Other >> May 16, 2024  4:49 PM Eveleen Hinds B wrote: Reason for CRM:  Burdette Carolin from [employer] need the info for TAF. Please FAX to [phone]

## 2024-05-23 NOTE — Patient Instructions (Signed)
 Medication Changes:  No medication changes.   Lab Work:  Go DOWN to LOWER LEVEL (LL) to have your blood work completed inside of [employer] office.  We will only call you if the results are abnormal or if the provider would like to make medication changes.   Testing/Procedures:  Please have your echo completed. You have to arrive 15 MINS EARLY for preparation, otherwise you will have to reschedule.  Check your MyChart. They will schedule this once the doctor's schedule has opened.   Follow-Up in: 3 months after your echo has been completed.  At the [facility], you and your health needs are our priority. We have a designated team specialized in the treatment of Heart Failure. This Care Team includes your primary Heart Failure Specialized Cardiologist (physician), Advanced Practice Providers (APPs- Physician Assistants and Nurse Practitioners), and Pharmacist who all work together to provide you with the care you need, when you need it.   You may see any of the following providers on your designated Care Team at your next follow up:  Dr. Jules Oar Dr. Peder Bourdon Dr. Alwin Baars Dr. Judyth Nunnery Shawnee Dellen, FNP Bevely Brush, RPH-CPP  Please be sure to bring in all your medications bottles to every appointment.   Need to Contact Us :  If you have any questions or concerns before your next appointment please send us  a message through [place] or call our office at [phone].    TO LEAVE A MESSAGE FOR THE NURSE SELECT OPTION 2, PLEASE LEAVE A MESSAGE INCLUDING: YOUR NAME DATE OF BIRTH CALL BACK NUMBER REASON FOR CALL**this is important as we prioritize the call backs  YOU WILL RECEIVE A CALL BACK THE SAME DAY AS LONG AS YOU CALL BEFORE 4:00 PM

## 2024-05-24 ENCOUNTER — Telehealth: Payer: Self-pay | Admitting: *Deleted

## 2024-05-24 LAB — BASIC METABOLIC PANEL WITH GFR
BUN/Creatinine Ratio: 14 (ref 10–24)
BUN: 11 mg/dL (ref 8–27)
CO2: 23 mmol/L (ref 20–29)
Calcium: 9.2 mg/dL (ref 8.6–10.2)
Chloride: 102 mmol/L (ref 96–106)
Creatinine, Ser: 0.77 mg/dL (ref 0.76–1.27)
Glucose: 106 mg/dL — ABNORMAL HIGH (ref 70–99)
Potassium: 4.3 mmol/L (ref 3.5–5.2)
Sodium: 142 mmol/L (ref 134–144)
eGFR: 99 mL/min/{1.73_m2} (ref >59)

## 2024-05-24 LAB — BRAIN NATRIURETIC PEPTIDE: BNP: 279 pg/mL — ABNORMAL HIGH (ref 0.0–100.0)

## 2024-05-24 NOTE — Telephone Encounter (Signed)
 Copied from CRM [id number]. Topic: Appointments - Appointment Scheduling >> May 19, 2024  4:09 PM Corean Deutscher wrote: Patient is calling to reschedule an appointment with NP Parrett. Patient had an video appointment scheduled for today but patient stated no video ever started for his appointment scheduled for 3:30pm. Patient would like to reschedule with NP Parrett as soon as possible, no availability until August, patient is requesting sooner appointment. >> May 22, 2024  8:54 AM Harriette Limerick wrote: Can Tammy double book one day?  Called and spoke with patient, he states that he signed on to his mychart visit on 5/30 at 3:20 pm, he stayed on until 4:20 pm and no one joined him.  He states he saw his heart doctor yesterday.  His heart doctor stated he does not need to pursue surgery to repair the issue with his lung d/t his heart function.  He said he did not need to reschedule his visit with Tammy since he spoke with his heart doctor and got the answers he needed.  I scheduled his 3 month follow up on July 30th at 1:30 pm, advised to arrive by 1:15 pm for check in.  He verbalized understanding.  Nothing further needed.

## 2024-05-24 NOTE — Telephone Encounter (Signed)
 Patient wants to know if he can continue taking albuterol  along with new start ohtuvayre ?

## 2024-05-24 NOTE — Telephone Encounter (Signed)
 Copied from CRM [id number]. Topic: Clinical - Medication Question >> May 19, 2024  4:08 PM Robert Church wrote: Reason for CRM: Patient called regarding Ensifentrine  (OHTUVAYRE ) 3 MG/2.5ML SUSP to inquire if he can continue taking all his medications together or if he needs to stop taking any of his other medications.    LMTCB re: medications.

## 2024-05-24 NOTE — Telephone Encounter (Signed)
 Yes patient is to continue albuterol  for as needed use.

## 2024-05-24 NOTE — Telephone Encounter (Signed)
 Patient advised to d/c Daliresp  per message [facility] He had also taken albuterol  treatment earlier in the am, when he experienced extreme sob. He has been off Daliresp  for 1.5 days and will try to use ohtuvayre  agasn tonight.  Patient advised to call 911 if sob worsens and contact our office for further assistance.           Patient wants to know if he is to continue albuterol  neb and or rescue inhaler with this therapy?  Thais Fill, RPH-CPP    05/22/24  2:21 PM Note Addressed in MyChart message: The only medication that would interact with Ohtuvayre  is roflumilast  (Daliresp ) which should be discontinued. However the remaining COPD treatments should be continued as below: Continue your Dulera  and Spiriva  Continue prednisone  10 mg once daily Continue Singulair  10 mg each evening Continue guaifenesin  (Mucinex ) as you have been taking it

## 2024-05-25 ENCOUNTER — Telehealth: Payer: Self-pay | Admitting: Adult Health

## 2024-05-25 ENCOUNTER — Telehealth: Payer: Self-pay | Admitting: *Deleted

## 2024-05-25 NOTE — Telephone Encounter (Signed)
 Please call to reschedule.

## 2024-05-25 NOTE — Telephone Encounter (Signed)
 Copied from CRM [id number]. Topic: Appointments - Appointment Scheduling >> May 19, 2024  4:09 PM Corean Deutscher wrote: Patient is calling to reschedule an appointment with NP Parrett. Patient had an video appointment scheduled for today but patient stated no video ever started for his appointment scheduled for 3:30pm. Patient would like to reschedule with NP Parrett as soon as possible, no availability until August, patient is requesting sooner appointment. >> May 22, 2024  8:54 AM Harriette Limerick wrote: Can Tammy double book one day?  Duplicate.  Nothing further needed.

## 2024-05-25 NOTE — Telephone Encounter (Signed)
 Patient aware He has not started new neb med He will start tonight as he fell asleep last night. Will call back and check on sx tomorrow.

## 2024-05-27 DIAGNOSIS — J449 Chronic obstructive pulmonary disease, unspecified: Secondary | ICD-10-CM | POA: Diagnosis not present

## 2024-05-27 DIAGNOSIS — J9610 Chronic respiratory failure, unspecified whether with hypoxia or hypercapnia: Secondary | ICD-10-CM | POA: Diagnosis not present

## 2024-05-28 DIAGNOSIS — J449 Chronic obstructive pulmonary disease, unspecified: Secondary | ICD-10-CM | POA: Diagnosis not present

## 2024-05-29 NOTE — Telephone Encounter (Signed)
 Scheduled to see Dr. Baldwin Levee on 07/19/2024.  I had already talked to patient on 05/24/2024 and addressed the issue with rescheduling visit that was missed and patient stated it was not necessary.  Nothing further needed.

## 2024-05-29 NOTE — Telephone Encounter (Signed)
 Patient is still experiencing more sob and nervousness since starting ohtuvvayre. He had d/c daliresp  per pharmacy suggestion but he states sob worsened. Patient has resumed Daliresp  along with meds list below. He has a virtual visit with TP on Friday but would like advise before then.     Continue your Dulera  and Spiriva  as you have been taking them Continue Daliresp  as you have been taking it Continue prednisone  10 mg once daily Continue Singulair  10 mg each evening We will try starting Ohtuvayre  nebulizer treatments twice a day Continue guaifenesin  (Mucinex ) as you have been taking it Please follow in our office in 3 months so that we can see how you are doing on the new medication      Patient Instructions by Denson Flake, MD at 03/30/2024 3:00 PM

## 2024-05-29 NOTE — Telephone Encounter (Signed)
 Patient has been scheduled

## 2024-05-29 NOTE — Telephone Encounter (Signed)
 Yes if he needs to be seen sooner

## 2024-05-30 ENCOUNTER — Encounter: Admitting: Student

## 2024-05-31 ENCOUNTER — Ambulatory Visit: Payer: Self-pay | Admitting: Adult Health

## 2024-05-31 NOTE — Telephone Encounter (Signed)
 This has already been addressed and this message was closed.  Nothing further was needed.  Closing encounter again as this was a duplicate I had already spoken with pt. And he has a f/u with Dr. Baldwin Levee.

## 2024-05-31 NOTE — Telephone Encounter (Signed)
 FYI Only or Action Required?: Action required by provider  Patient is followed in Pulmonology for COPD, asthma, and herniated lung, last seen on 05/03/2024 by Denson Flake, MD. Called Nurse Triage reporting Shortness of Breath, Anxiety, and Shaking. Symptoms began several days ago. Interventions attempted: Prescription medications: ohtuvayre , daliresp  and Other: stopping/starting meds per pulm recommendations but needing next steps. Symptoms are: denies current symptoms.  Triage Disposition: Call PCP Now  Patient/caregiver understands and will follow disposition?: Yes - Needs call back from pulm asap           No CRM sent. Per agent, pt having worsening SOB and nervousness since starting ohtuvayre  even from the other day when he called, pt thought someone would call him back and no one did, CAL told agent to have pt talk to NT. Pt is out of Daliresp  and cannot take ohtuvayre , no longer taking ohtuvayre , Daliresp  is $150 to refill.  Reason for Disposition  [1] Caller has URGENT medicine question about med that PCP or specialist prescribed AND [2] triager unable to answer question  Answer Assessment - Initial Assessment Questions E2C2 Pulmonary Triage - Initial Assessment Questions Chief Complaint (e.g., cough, sob, wheezing, fever, chills, sweat or additional symptoms) *Go to specific symptom protocol after initial questions. Told me last week to stop taking daliresp , quit taking it, waited a day, started ohtuvayre  again, gave me SOB and the shakes, spoke to someone on Monday, was going to send message to pharmacist and not heard anything back Out of daliresp  and $150 to refill don't want to spend money if they're gonna say don't take it No shakes or nothing since quit taking the ohtuvayre  on Sunday No worsening in the past few days, no different, was worse with ohtuvayre  than without it No chest pain, dizziness, or weakness more than usual Not coughing more than usual, phlegm is  crystal clear, if go into pollen then turns little bit yellow but then it clears up  Have you used your inhalers/maintenance medication? Yes If yes, What medications? Not having to use rescue inhaler more  OXYGEN : Do you wear supplemental oxygen ? Yes If yes, How many liters are you supposed to use? At night when sleep 2L  Do you monitor your oxygen  levels? Yes If yes, What is your reading (oxygen  level) today? 93%  What is your usual oxygen  saturation reading?  (Note: Pulmonary O2 sats should be 90% or greater) 91-93%  Answer Assessment - Initial Assessment Questions 1. NAME of MEDICINE: What medicine(s) are you calling about?     daliresp  2. QUESTION: What is your question? (e.g., double dose of medicine, side effect)     Needs to know if pulm wanting pt to stay on daliresp  or switch to another med, since reacted to daliresp  in the past, but reacted to ohtuvayre  this past week and was told to stop ohtuvayre  and go back to daliresp  in the meantime, pt out of daliresp  now and needs to know if he should refill it or switch meds, refill will be $150 which he would prefer not to pay, needs call back asap 4. SYMPTOMS: Do you have any symptoms? If Yes, ask: What symptoms are you having?  How bad are the symptoms (e.g., mild, moderate, severe)     Denies current symptoms but had allergic reaction to daliresp  in the past and this week had worsening SOB and shaking with nervousness from ohtuvayre  that pt reports has since resolved with stopping ohtuvayre  on Sunday  Protocols used: Breathing Difficulty-A-AH, Medication  Question Call-A-AH

## 2024-05-31 NOTE — Telephone Encounter (Signed)
 I called the pt and there was no answer- LMTCB. ?

## 2024-05-31 NOTE — Progress Notes (Deleted)
  Cardiology Office Note:   Date:  05/31/2024  ID:  Robert Church, DOB 05/09/1956, MRN [id number]  Primary Cardiologist: Lauro Portal, MD Electrophysiologist: Lei Pump, MD   History of Present Illness:   Robert Church is a 68 y.o. male with h/o *** seen today for {VISITTYPE:28148}    Review of systems complete and found to be negative unless listed in HPI.    EP Information / Studies Reviewed:    {EKGtoday:28818}  ***  Arrhythmia/Device History St. Jude Dual Chamber PPM implanted 03/2021 for CHB. Failed Upgrade attempt 10/07/2021 Barostim implantation 01/29/2022   Barostim Interrogation- Performed personally and reviewed in detail today,  See scanned report  ICD/PPM interrogation - Not performed today. See last [employer] report    Physical Exam:   VS:  There were no vitals taken for this visit.   Wt Readings from Last 3 Encounters:  05/23/24 232 lb 6.4 oz (105.4 kg)  04/03/24 231 lb 11.2 oz (105.1 kg)  03/30/24 232 lb 6.4 oz (105.4 kg)     GEN: Well nourished, well developed in no acute distress NECK: No JVD; No carotid bruits CARDIAC: {EPRHYTHM:28826}, no murmurs, rubs, gallops RESPIRATORY:  Clear to auscultation without rales, wheezing or rhonchi  ABDOMEN: Soft, non-tender, non-distended EXTREMITIES:  {EDEMA LEVEL:28147::No} edema; No deformity   ASSESSMENT AND PLAN:    Chronic systolic CHF s/p Abbott PPM and Barostim implantation Intermittent heart block NYHA *** symptoms. ***  Device programmed at 5.0 ma for chronic settings  Device impedence stable. Pt goals are to ***  Normal device function See scanned report. Will follow up in *** weeks to continue titration with goal of 6-8 milliamps for chronic settings.   Atrial fibrillation, possible Continue to monitor on device  Disposition:   Follow up with {EPPROVIDERS:28135::EP Team} in {EPFOLLOW UP:28173}  Signed, Tylene Galla, PA-C

## 2024-06-01 ENCOUNTER — Telehealth: Payer: Self-pay

## 2024-06-01 NOTE — Telephone Encounter (Signed)
 I would favor stopping Ohtuvayre  and getting back on daliresp  per his previous prescription

## 2024-06-01 NOTE — Telephone Encounter (Signed)
 Spoke with Robert Church regarding prior message. Robert Church stated he has not taken Ohtuvayre  and still; having SOB,nervousness . Robert Church stated he can not live with out taking Dalirersp he went to the pharmacy and bought 6 pills today . Robert Church didn't want to spend 180.00 if the provider is going to d/c that medication. Advised Robert Church he does have a virtual visit with Tammy at 3:00pm. Robert Church stated he wanted to talk to the CMA he spoke with the other day . Advised Robert Church that she is seeing Robert Church's and may not be able to call him back today sine she will be having Robert Church's until 5 with PFT.  Soyna Robert Church is requesting for you to call him back .

## 2024-06-02 ENCOUNTER — Ambulatory Visit: Admitting: Adult Health

## 2024-06-02 ENCOUNTER — Ambulatory Visit: Attending: Student | Admitting: Student

## 2024-06-02 DIAGNOSIS — Z8679 Personal history of other diseases of the circulatory system: Secondary | ICD-10-CM

## 2024-06-02 DIAGNOSIS — I5042 Chronic combined systolic (congestive) and diastolic (congestive) heart failure: Secondary | ICD-10-CM

## 2024-06-02 NOTE — Telephone Encounter (Signed)
 Routing to TP since the pt has virtual appt with her today to discuss meds

## 2024-06-02 NOTE — Telephone Encounter (Signed)
 I spoke with the pt and notified him of response from Dr Baldwin Levee  Pt verbalized understanding  Will remove the ohtuvayre  from med list  He still has rx on hand for the daliresp   Will cancel the virtual ov with Tammy for today and keep in person with Byrm for 07/19/24  Nothing further needed

## 2024-06-05 ENCOUNTER — Encounter (HOSPITAL_COMMUNITY): Payer: Self-pay

## 2024-06-05 ENCOUNTER — Encounter: Payer: Self-pay | Admitting: Student

## 2024-06-05 ENCOUNTER — Ambulatory Visit (HOSPITAL_COMMUNITY): Admission: RE | Admit: 2024-06-05 | Source: Ambulatory Visit

## 2024-06-07 ENCOUNTER — Ambulatory Visit (HOSPITAL_COMMUNITY)
Admission: RE | Admit: 2024-06-07 | Discharge: 2024-06-07 | Disposition: A | Discharge status: Home / Self Care | Source: Ambulatory Visit | Attending: Internal Medicine | Admitting: Internal Medicine

## 2024-06-07 DIAGNOSIS — I429 Cardiomyopathy, unspecified: Secondary | ICD-10-CM | POA: Diagnosis not present

## 2024-06-07 DIAGNOSIS — I340 Nonrheumatic mitral (valve) insufficiency: Secondary | ICD-10-CM | POA: Insufficient documentation

## 2024-06-07 DIAGNOSIS — J449 Chronic obstructive pulmonary disease, unspecified: Secondary | ICD-10-CM | POA: Insufficient documentation

## 2024-06-07 DIAGNOSIS — F17200 Nicotine dependence, unspecified, uncomplicated: Secondary | ICD-10-CM | POA: Diagnosis not present

## 2024-06-07 DIAGNOSIS — E119 Type 2 diabetes mellitus without complications: Secondary | ICD-10-CM | POA: Diagnosis not present

## 2024-06-07 DIAGNOSIS — I110 Hypertensive heart disease with heart failure: Secondary | ICD-10-CM | POA: Insufficient documentation

## 2024-06-07 DIAGNOSIS — I5042 Chronic combined systolic (congestive) and diastolic (congestive) heart failure: Secondary | ICD-10-CM | POA: Insufficient documentation

## 2024-06-07 LAB — ECHOCARDIOGRAM COMPLETE
Area-P 1/2: 8.92 cm2
Calc EF: 25.8 %
S' Lateral: 7.2 cm
Single Plane A2C EF: 27.2 %
Single Plane A4C EF: 24.7 %

## 2024-06-07 NOTE — Progress Notes (Signed)
  Echocardiogram 2D Echocardiogram has been performed.  Robert Church 06/07/2024, 3:52 PM

## 2024-06-12 ENCOUNTER — Other Ambulatory Visit: Payer: Self-pay | Admitting: *Deleted

## 2024-06-12 DIAGNOSIS — Z122 Encounter for screening for malignant neoplasm of respiratory organs: Secondary | ICD-10-CM

## 2024-06-12 DIAGNOSIS — Z87891 Personal history of nicotine dependence: Secondary | ICD-10-CM

## 2024-06-12 DIAGNOSIS — F17210 Nicotine dependence, cigarettes, uncomplicated: Secondary | ICD-10-CM

## 2024-06-14 ENCOUNTER — Other Ambulatory Visit: Payer: Self-pay | Admitting: Emergency Medicine

## 2024-06-15 ENCOUNTER — Telehealth (HOSPITAL_COMMUNITY): Payer: Self-pay | Admitting: Cardiology

## 2024-06-15 NOTE — Telephone Encounter (Signed)
 Patient called  Reports he reviewed echo results  Results noted decrease in EF would like to know if changes are needed to improve EF -meds -upgrade pacemaker

## 2024-06-16 ENCOUNTER — Telehealth (HOSPITAL_COMMUNITY): Payer: Self-pay

## 2024-06-16 DIAGNOSIS — I5042 Chronic combined systolic (congestive) and diastolic (congestive) heart failure: Secondary | ICD-10-CM

## 2024-06-16 MED ORDER — LOSARTAN POTASSIUM 25 MG PO TABS
12.5000 mg | ORAL_TABLET | Freq: Every day | ORAL | 3 refills | Status: AC
Start: 2024-06-16 — End: ?

## 2024-06-16 NOTE — Telephone Encounter (Signed)
 Received call from patient who states that he is willing to restart his losartan  to help with his EF. Patient reports that he does get dizzy, but reports that he will try taking this at night time. Advised patient I did not see this on his medication list. Patient reports he was taking previously but was discontinued due to him not taking d/t dizziness. Patient reports that he has the losartan  25mg  every day at home. Advised patient may need to start at lower dosage, will check with provider for recommendations.   Patient aware and verbalized understanding.

## 2024-06-16 NOTE — Telephone Encounter (Signed)
 Pt aware Reports he has losartan  in the home that he will take prn for elevated b/p readings, will attempt to restart

## 2024-06-16 NOTE — Telephone Encounter (Signed)
 [place], Harlene HERO, FNP to Me (Selected Message)     06/16/24  3:40 PM Ok to start losartan  12.5 mg at bedtime (1/2 tab), will need a BMET in 10-14 days  Returned call to patient and made him aware. Repeat labs ordered and scheduled. Patient aware of appointment time and date. Advised patient to call back to office with any issues, questions, or concerns. Patient verbalized understanding.

## 2024-06-20 ENCOUNTER — Telehealth: Payer: Self-pay | Admitting: Cardiology

## 2024-06-20 NOTE — Telephone Encounter (Signed)
 Patient calling to see if what kind of pacemaker he has and if there is an upgrade since he is having so many heart problems. Please advise

## 2024-06-21 NOTE — Telephone Encounter (Signed)
 Left message requesting  call back.

## 2024-06-21 NOTE — Telephone Encounter (Signed)
 Call back received from Pt.  Discussed current devices.  Pt states he had a recent echo and is wondering if any further device upgrades available.    Advised Pt he had a BIV PPM and barostim.  Pt will follow up with Dr. Bensimhon with heart failure medications.  All questions answered.

## 2024-06-22 ENCOUNTER — Encounter: Payer: Self-pay | Admitting: Internal Medicine

## 2024-06-22 ENCOUNTER — Ambulatory Visit: Admitting: Internal Medicine

## 2024-06-22 VITALS — BP 110/74 | HR 91 | Temp 97.8°F | Ht 70.0 in | Wt 231.4 lb

## 2024-06-22 DIAGNOSIS — M4802 Spinal stenosis, cervical region: Secondary | ICD-10-CM

## 2024-06-22 DIAGNOSIS — G8929 Other chronic pain: Secondary | ICD-10-CM

## 2024-06-22 DIAGNOSIS — M1A9XX1 Chronic gout, unspecified, with tophus (tophi): Secondary | ICD-10-CM

## 2024-06-22 DIAGNOSIS — M47816 Spondylosis without myelopathy or radiculopathy, lumbar region: Secondary | ICD-10-CM | POA: Diagnosis not present

## 2024-06-22 DIAGNOSIS — M5030 Other cervical disc degeneration, unspecified cervical region: Secondary | ICD-10-CM | POA: Diagnosis not present

## 2024-06-22 DIAGNOSIS — F17210 Nicotine dependence, cigarettes, uncomplicated: Secondary | ICD-10-CM

## 2024-06-22 DIAGNOSIS — M170 Bilateral primary osteoarthritis of knee: Secondary | ICD-10-CM

## 2024-06-22 DIAGNOSIS — M5412 Radiculopathy, cervical region: Secondary | ICD-10-CM

## 2024-06-22 DIAGNOSIS — I10 Essential (primary) hypertension: Secondary | ICD-10-CM

## 2024-06-22 DIAGNOSIS — Z79891 Long term (current) use of opiate analgesic: Secondary | ICD-10-CM

## 2024-06-22 DIAGNOSIS — M545 Low back pain, unspecified: Secondary | ICD-10-CM

## 2024-06-22 MED ORDER — NICOTINE 14 MG/24HR TD PT24: 14.0000 mg | MEDICATED_PATCH | Freq: Every day | TRANSDERMAL | 1 refills | Status: AC

## 2024-06-22 MED ORDER — NALOXONE HCL 4 MG/0.1ML NA LIQD: 1.0000 | Freq: Once | NASAL | 3 refills | Status: AC

## 2024-06-22 MED ORDER — OXYCODONE HCL 5 MG PO TABS: 5.0000 mg | ORAL_TABLET | Freq: Three times a day (TID) | ORAL | 0 refills | Status: DC | PRN

## 2024-06-22 NOTE — Patient Instructions (Signed)

## 2024-06-22 NOTE — Progress Notes (Signed)
 Subjective:  Patient ID: Robert Church, male    DOB: January 26, 1956  Age: 68 y.o. MRN: [id number]  CC: Medical Management of Chronic Issues (Liver concerns and a whole bunch of other stuff.)   HPI Robert Church presents for f/up ----  Discussed the use of AI scribe software for clinical note transcription with the patient, who gave verbal consent to proceed.  History of Present Illness   Robert Church is a 68 year old male with heart failure and chronic pain who presents with worsening pain and medication concerns.  He has a history of heart failure with an ejection fraction of 20-25% on the left side, which limits his medication options for pain management. He experiences daily chest pain and shortness of breath. He currently takes Robert Church powders mixed with water for pain relief.  He describes a painful nodule on his left forearm, which he suspects might be related to gout, although he is uncertain. The nodule causes significant discomfort, especially when resting his arm in certain positions. An ultrasound has been performed on this nodule.  He experiences chronic pain in his wrists, legs, and knees, which he attributes to arthritis. His joints swell and cause significant discomfort, impacting his mobility and daily activities. He has tramadol  and gabapentin  at home but is hesitant to use them due to concerns about side effects.  He has a history of a broken rib, which remains painful and is described as 'completely broke' and 'not attached.' This injury contributes to his back pain, particularly when lying down, forcing him to sleep in a recliner to avoid exacerbating the pain.  He has a history of smoking and experiences persistent coughing with clear sputum production. No fever, chills, or hemoptysis. He reports wheezing and bronchial congestion, which he attributes to his smoking history.  He experiences dizziness, particularly after taking losartan  for blood pressure management. He  describes an episode where he felt the sink was moving and nearly fell off a ramp at his home. He is concerned about managing his blood pressure without exacerbating his dizziness.  He lives alone and manages his meals independently, often relying on breakfast bowls and simple meals due to his limited ability to cook. His diet sometimes affects his bowel movements, leading to constipation, for which he uses Urilax with some relief.       Outpatient Medications Prior to Visit  Medication Sig Dispense Refill   albuterol  (PROVENTIL ) (2.5 MG/3ML) 0.083% nebulizer solution TAKE 3 MLS BY NEBULIZATION EVERY 4 HOURS AS NEEDED FOR WHEEZING OR SHORTNESS OF BREATH 150 mL 11   allopurinol  (ZYLOPRIM ) 300 MG tablet Take 1 tablet (300 mg total) by mouth daily. 90 tablet 0   ALPRAZolam  (XANAX ) 0.5 MG tablet Take 1 tablet (0.5 mg total) by mouth 3 (three) times daily as needed for anxiety. 270 tablet 0   azelastine  (ASTELIN ) 0.1 % nasal spray Place 2 sprays into both nostrils 2 (two) times daily. Use in each nostril as directed 30 mL 1   baclofen  (LIORESAL ) 10 MG tablet Take 1 tablet (10 mg total) by mouth 3 (three) times daily. 30 each 0   DALIRESP  500 MCG TABS tablet Take 500 mcg by mouth daily.     diclofenac Sodium (VOLTAREN) 1 % GEL Apply 1 Application topically in the morning and at bedtime.     Ensifentrine  (OHTUVAYRE ) 3 MG/2.5ML SUSP Inhale 3 mg into the lungs 2 (two) times daily. 150 mL 5   EPINEPHrine  0.3 mg/0.3 mL IJ SOAJ  injection Inject 0.3 mg into the muscle as needed for anaphylaxis. 1 each 0   fluticasone  (FLONASE ) 50 MCG/ACT nasal spray SHAKE LIQUID AND USE 2 SPRAYS IN EACH NOSTRIL DAILY (Patient taking differently: Place 1 spray into both nostrils daily as needed for allergies.) 16 g 2   guaiFENesin  (MUCINEX ) 600 MG 12 hr tablet Take 1 tablet (600 mg total) by mouth 2 (two) times daily. 60 tablet 5   ipratropium (ATROVENT ) 0.06 % nasal spray Place 2 sprays into both nostrils 4 (four) times daily  as needed for rhinitis. (Patient taking differently: Place 2 sprays into both nostrils 4 (four) times daily as needed for rhinitis. As needed) 45 mL 3   ipratropium-albuterol  (DUONEB) 0.5-2.5 (3) MG/3ML SOLN Take 3 mLs by nebulization every 4 (four) hours as needed. 360 mL 5   loratadine (CLARITIN) 10 MG tablet Take 10 mg by mouth every other day. As needed     losartan  (COZAAR ) 25 MG tablet Take 0.5 tablets (12.5 mg total) by mouth at bedtime. 45 tablet 3   methocarbamol  (ROBAXIN ) 500 MG tablet Take 1 tablet (500 mg total) by mouth every 8 (eight) hours as needed for muscle spasms. 30 tablet 1   metolazone  (ZAROXOLYN ) 2.5 MG tablet Take 1 tablet (2.5 mg total) by mouth 2 (two) times a week. Take 2.5 mg on Monday/Friday 8 tablet 11   mometasone -formoterol  (DULERA ) 100-5 MCG/ACT AERO INHALE 2 PUFFS INTO THE LUNGS TWICE DAILY 13 g 5   montelukast  (SINGULAIR ) 10 MG tablet TAKE 1 TABLET BY MOUTH EVERY DAY AT BEDTIME 30 tablet 5   omeprazole  (PRILOSEC) 20 MG capsule TAKE 1 CAPSULE(20 MG) BY MOUTH TWICE DAILY BEFORE A MEAL FOR STOMACH ACID OR REFLUX 180 capsule 0   ondansetron  (ZOFRAN -ODT) 4 MG disintegrating tablet Take 1 tablet (4 mg total) by mouth every 8 (eight) hours as needed for nausea or vomiting. 20 tablet 0   potassium chloride  SA (KLOR-CON  M) 20 MEQ tablet Take 40 mEq by mouth 2 (two) times daily.     predniSONE  (DELTASONE ) 10 MG tablet Take 1 tablet (10 mg total) by mouth daily with breakfast. 90 tablet 1   Tiotropium Bromide  Monohydrate (SPIRIVA  RESPIMAT) 2.5 MCG/ACT AERS Inhale 2 puffs into the lungs daily. 4 g 6   torsemide  (DEMADEX ) 20 MG tablet Take 2 tablets (40 mg total) by mouth daily. 90 tablet 11   VENTOLIN  HFA 108 (90 Base) MCG/ACT inhaler INHALE 2 PUFFS INTO THE LUNGS EVERY 6 HOURS AS NEEDED FOR WHEEZING 18 g 4   vitamin B-12 (CYANOCOBALAMIN ) 1000 MCG tablet Take 1,000 mcg by mouth daily.     Vitamin D , Ergocalciferol , (DRISDOL ) 1.25 MG (50000 UNIT) CAPS capsule Take 1 capsule  (50,000 Units total) by mouth every 7 (seven) days. 8 capsule 0   vitamin E 180 MG (400 UNITS) capsule Take 400 Units by mouth daily.     No facility-administered medications prior to visit.    ROS Review of Systems  Constitutional:  Negative for appetite change, chills, diaphoresis, fatigue and fever.  HENT: Negative.    Eyes: Negative.   Respiratory:  Positive for cough, shortness of breath and wheezing. Negative for chest tightness.   Cardiovascular:  Positive for chest pain. Negative for palpitations and leg swelling.  Gastrointestinal:  Positive for constipation. Negative for abdominal pain, diarrhea, nausea and vomiting.  Endocrine: Negative.   Genitourinary: Negative.  Negative for difficulty urinating.  Musculoskeletal:  Positive for arthralgias, back pain, gait problem, joint swelling and neck pain. Negative  for myalgias and neck stiffness.  Neurological:  Positive for dizziness and light-headedness. Negative for seizures and weakness.  Hematological:  Negative for adenopathy. Does not bruise/bleed easily.  Psychiatric/Behavioral: Negative.      Objective:  BP 110/74 (BP Location: Left Arm, Patient Position: Sitting, Cuff Size: Normal)   Pulse 91   Temp 97.8 F (36.6 C) (Temporal)   Ht 5' 10 (1.778 m)   Wt 231 lb 6.4 oz (105 kg)   SpO2 93%   BMI 33.20 kg/m   BP Readings from Last 3 Encounters:  06/22/24 110/74  05/23/24 123/84  04/03/24 116/80    Wt Readings from Last 3 Encounters:  06/22/24 231 lb 6.4 oz (105 kg)  05/23/24 232 lb 6.4 oz (105.4 kg)  04/03/24 231 lb 11.2 oz (105.1 kg)    Physical Exam Vitals reviewed.  Constitutional:      General: He is not in acute distress.    Appearance: He is ill-appearing. He is not toxic-appearing or diaphoretic.  HENT:     Mouth/Throat:     Mouth: Mucous membranes are moist.  Eyes:     General: No scleral icterus.    Conjunctiva/sclera: Conjunctivae normal.  Cardiovascular:     Rate and Rhythm: Normal rate and  regular rhythm.     Heart sounds: No murmur heard.    No friction rub. No gallop.  Pulmonary:     Effort: Pulmonary effort is normal. No tachypnea or respiratory distress.     Breath sounds: Examination of the right-upper field reveals wheezing and rhonchi. Examination of the left-upper field reveals wheezing and rhonchi. Examination of the right-middle field reveals wheezing and rhonchi. Examination of the left-middle field reveals wheezing and rhonchi. Examination of the right-lower field reveals wheezing and rhonchi. Examination of the left-lower field reveals wheezing and rhonchi. Wheezing and rhonchi present. No decreased breath sounds or rales.  Chest:     Chest wall: No tenderness.  Abdominal:     General: Abdomen is protuberant. There is no distension.     Palpations: There is no mass.     Tenderness: There is no abdominal tenderness. There is no guarding.  Musculoskeletal:        General: Swelling, tenderness and deformity present.     Cervical back: Neck supple.     Right lower leg: No edema.     Left lower leg: No edema.  Lymphadenopathy:     Cervical: No cervical adenopathy.  Skin:    General: Skin is warm and dry.     Coloration: Skin is not jaundiced.     Findings: No rash.  Neurological:     General: No focal deficit present.     Mental Status: Mental status is at baseline.  Psychiatric:        Mood and Affect: Mood normal.        Behavior: Behavior normal.     Lab Results  Component Value Date   WBC 8.4 01/07/2024   HGB 14.1 01/07/2024   HCT 42.0 01/07/2024   PLT 279.0 01/07/2024   GLUCOSE 106 (H) 05/23/2024   CHOL 187 04/03/2024   TRIG 175.0 (H) 04/03/2024   HDL 46.90 04/03/2024   LDLDIRECT 157.0 09/15/2022   LDLCALC 105 (H) 04/03/2024   ALT 14 03/02/2024   AST 17 03/02/2024   NA 142 05/23/2024   K 4.3 05/23/2024   CL 102 05/23/2024   CREATININE 0.77 05/23/2024   BUN 11 05/23/2024   CO2 23 05/23/2024   TSH 0.40 04/03/2024  PSA 3.62 11/09/2023    INR 1.0 11/09/2023   HGBA1C 6.0 04/03/2024    ECHOCARDIOGRAM COMPLETE Result Date: 06/07/2024    ECHOCARDIOGRAM REPORT   Patient Name:   Robert Church Date of Exam: 06/07/2024 Medical Rec #:  [id number]     Height:       70.0 in Accession #:    [id number]    Weight:       232.4 lb Date of Birth:  14-Aug-1956      BSA:          2.225 m Patient Age:    67 years      BP:           130/86 mmHg Patient Gender: M             HR:           86 bpm. Exam Location:  Outpatient Procedure: 2D Echo, 3D Echo, Cardiac Doppler, Color Doppler and Strain Analysis            (Both Spectral and Color Flow Doppler were utilized during            procedure). Indications:    I50.40* Unspecified combined systolic (congestive) and diastolic                 (congestive) heart failure  History:        Patient has prior history of Echocardiogram examinations, most                 recent 06/02/2023. Cardiomyopathy and CHF, COPD; Risk                 Factors:Current Smoker, Hypertension and Diabetes.  Sonographer:    Ellouise Mose RDCS Referring Phys: [id number] DANIEL R BENSIMHON  Sonographer Comments: Patient with broken ribs in apica region. IMPRESSIONS  1. Left ventricular ejection fraction, by estimation, is 20 to 25%. The left ventricle has severely decreased function. The left ventricle demonstrates global hypokinesis. The left ventricular internal cavity size was severely dilated. There is mild concentric left ventricular hypertrophy. Left ventricular diastolic parameters are consistent with Grade II diastolic dysfunction (pseudonormalization). Elevated left atrial pressure. The average left ventricular global longitudinal strain is -7.4 %. The  global longitudinal strain is abnormal.  2. Right ventricular systolic function is normal. The right ventricular size is mildly enlarged. Tricuspid regurgitation signal is inadequate for assessing PA pressure.  3. The mitral valve is normal in structure. Mild mitral valve regurgitation. No evidence of  mitral stenosis.  4. The aortic valve is normal in structure. Aortic valve regurgitation is not visualized. Aortic valve sclerosis/calcification is present, without any evidence of aortic stenosis.  5. The inferior vena cava is dilated in size with <50% respiratory variability, suggesting right atrial pressure of 15 mmHg. FINDINGS  Left Ventricle: Left ventricular ejection fraction, by estimation, is 20 to 25%. The left ventricle has severely decreased function. The left ventricle demonstrates global hypokinesis. The average left ventricular global longitudinal strain is -7.4 %. Strain was performed and the global longitudinal strain is abnormal. The left ventricular internal cavity size was severely dilated. There is mild concentric left ventricular hypertrophy. Abnormal (paradoxical) septal motion, consistent with left bundle branch block. Left ventricular diastolic parameters are consistent with Grade II diastolic dysfunction (pseudonormalization). Elevated left atrial pressure. Right Ventricle: The right ventricular size is mildly enlarged. No increase in right ventricular wall thickness. Right ventricular systolic function is normal. Tricuspid regurgitation signal is inadequate for assessing PA pressure. Left  Atrium: Left atrial size was normal in size. Right Atrium: Right atrial size was normal in size. Pericardium: There is no evidence of pericardial effusion. Mitral Valve: The mitral valve is normal in structure. Mild mitral valve regurgitation. No evidence of mitral valve stenosis. Tricuspid Valve: The tricuspid valve is normal in structure. Tricuspid valve regurgitation is trivial. No evidence of tricuspid stenosis. Aortic Valve: The aortic valve is normal in structure. Aortic valve regurgitation is not visualized. Aortic valve sclerosis/calcification is present, without any evidence of aortic stenosis. Pulmonic Valve: The pulmonic valve was normal in structure. Pulmonic valve regurgitation is not  visualized. No evidence of pulmonic stenosis. Aorta: The aortic root is normal in size and structure. Venous: The inferior vena cava is dilated in size with less than 50% respiratory variability, suggesting right atrial pressure of 15 mmHg. IAS/Shunts: No atrial level shunt detected by color flow Doppler. Additional Comments: A device lead is visualized.  LEFT VENTRICLE PLAX 2D LVIDd:         7.50 cm      Diastology LVIDs:         7.20 cm      LV e' medial:    7.07 cm/s LV PW:         1.20 cm      LV E/e' medial:  14.1 LV IVS:        1.20 cm      LV e' lateral:   5.33 cm/s                             LV E/e' lateral: 18.8  LV Volumes (MOD)            2D Longitudinal Strain LV vol d, MOD A2C: 324.0 ml 2D Strain GLS Avg:     -7.4 % LV vol d, MOD A4C: 235.0 ml LV vol s, MOD A2C: 236.0 ml LV vol s, MOD A4C: 177.0 ml LV SV MOD A2C:     88.0 ml LV SV MOD A4C:     235.0 ml LV SV MOD BP:      71.3 ml RIGHT VENTRICLE         IVC TAPSE (M-mode): 2.1 cm  IVC diam: 2.40 cm LEFT ATRIUM           Index        RIGHT ATRIUM           Index LA diam:      3.30 cm 1.48 cm/m   RA Area:     22.40 cm LA Vol (A2C): 20.4 ml 9.17 ml/m   RA Volume:   66.20 ml  29.75 ml/m LA Vol (A4C): 32.1 ml 14.43 ml/m  AORTIC VALVE LVOT Vmax:   115.00 cm/s LVOT Vmean:  68.600 cm/s LVOT VTI:    0.193 m  AORTA Ao Root diam: 3.50 cm Ao Asc diam:  3.80 cm MITRAL VALVE MV Area (PHT): 8.92 cm     SHUNTS MV Decel Time: 85 msec      Systemic VTI: 0.19 m MV E velocity: 100.00 cm/s Morene Brownie Electronically signed by Morene Brownie Signature Date/Time: 06/07/2024/6:01:40 PM    Final     Assessment & Plan:  Degenerative disc disease, cervical -     oxyCODONE  HCl; Take 1 tablet (5 mg total) by mouth every 8 (eight) hours as needed for severe pain (pain score 7-10).  Dispense: 90 tablet; Refill: 0  Foraminal stenosis of cervical region -  oxyCODONE  HCl; Take 1 tablet (5 mg total) by mouth every 8 (eight) hours as needed for severe pain (pain score  7-10).  Dispense: 90 tablet; Refill: 0  Lumbar spondylosis -     oxyCODONE  HCl; Take 1 tablet (5 mg total) by mouth every 8 (eight) hours as needed for severe pain (pain score 7-10).  Dispense: 90 tablet; Refill: 0  Chronic bilateral low back pain without sciatica -     oxyCODONE  HCl; Take 1 tablet (5 mg total) by mouth every 8 (eight) hours as needed for severe pain (pain score 7-10).  Dispense: 90 tablet; Refill: 0  Primary osteoarthritis of both knees -     oxyCODONE  HCl; Take 1 tablet (5 mg total) by mouth every 8 (eight) hours as needed for severe pain (pain score 7-10).  Dispense: 90 tablet; Refill: 0  Cervical radiculopathy -     oxyCODONE  HCl; Take 1 tablet (5 mg total) by mouth every 8 (eight) hours as needed for severe pain (pain score 7-10).  Dispense: 90 tablet; Refill: 0  Chronic tophaceous gout -     oxyCODONE  HCl; Take 1 tablet (5 mg total) by mouth every 8 (eight) hours as needed for severe pain (pain score 7-10).  Dispense: 90 tablet; Refill: 0  Long-term current use of opiate analgesic -     Naloxone HCl; Place 1 spray into the nose once for 1 dose.  Dispense: 2 each; Refill: 3  Cigarette smoker -     Nicotine ; Place 1 patch (14 mg total) onto the skin daily.  Dispense: 28 patch; Refill: 1  Primary hypertension- His BP is over-controlled.     Follow-up: Return in about 3 months (around 09/22/2024).  Debby Molt, MD

## 2024-06-26 ENCOUNTER — Telehealth (HOSPITAL_COMMUNITY): Payer: Self-pay | Admitting: Adult Health

## 2024-06-26 DIAGNOSIS — J449 Chronic obstructive pulmonary disease, unspecified: Secondary | ICD-10-CM | POA: Diagnosis not present

## 2024-06-26 DIAGNOSIS — J9610 Chronic respiratory failure, unspecified whether with hypoxia or hypercapnia: Secondary | ICD-10-CM | POA: Diagnosis not present

## 2024-06-26 NOTE — Progress Notes (Signed)
 ADVANCED HF CLINIC NOTE  PCP: Joshua Debby CROME, MD Pulmonology: Dr. Shelah HF Cardiologist: Dr. Cherrie   HPI: Robert Church is a 68 y.o. male with h/o obesity, HTN, COPD with ongoing tobacco use and chronic systolic heart failure.    Cath 03/2017 - normal coronaries with EF 40% and global HK.  He saw Dr. Court on 07/01/2018 because of recurrent chest pain.  Myoview  stress test performed 07/21/2018 showed EF 38% inferior scar with septal ischemia.  Echo 06/2018  EF of 30 to 35%.  He was referred for cardiac CT.   Admitted 1/22 for acute on chronic hypoxic respiratory failure secondary to acute COPD and CHF w/ volume overload. He had massive diuresis w/ IV Lasix , diuresed down from admit wt of 265 lb to 210 lb. Echo 12/21, EF 40-45% (unchanged). RV mildly reduced, RVSP 49.    In 4/22, had near syncope, Zio showed intermittent CHB. Repeat echo EF 30-35%  Had St Jude dual chamber PM 04/16/21. Had some PMT and device adjusted by EP .   Underwent R/L cath in 9/22 for persistent SOB. No CAD. EF 35% Mildly elevated filling pressures with normal output.   Subsequently underwent CRT-D upgrade attempt on 10/07/21 by Dr. Inocencio due to high-degree of RV pacing. 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   S/p Barostim 2/23  S/p BiV upgrade 11/11/22. C/b LYE DVT and started on Eliquis .   S/p rib/costochondral fracture leading to chest wall hernia with partial herniation of the lateral aspect of the inferior lingula and left lower lobe. Seen by TCTS. Not surgical candidate.   Follow up 6/25, felt poorly and volume up, mostly WC-bound. Echo 6/25 EF 20-25%, mild LVH, G2DD, normal RV.  Today he returns for HF follow up. Overall feeling poorly. He was burning wood today and inhaled some smoke, now more SOB. Has rare chest tightness. Denies palpitations, abnormal bleeding, dizziness, edema, or PND/Orthopnea. Chronically sleeps in recliner. Appetite ok. Eats a lot of canned vienna  sausages. Taking all medications. Smokes a few cigs per day. Drinks 3-4 vodka drinks/day.   Cardiac studies: - Echo 6/25: EF 20-25%, mild LVH, G2DD normal RV  - Echo 01/13/23: EF < 20%, RV mildly down  - Echo 07/2021: EF 30-35%, RV moderately reduced   - Echo 03/2021: EF 30-35%, RV mildly reduced   - Echo 11/2020: EF 40-45%, RV mildly reduced  - Echo 12/2018: EF 40-45%, RV normal   - Echo 06/2018: EF 30-35%, RV normal    - RHC 3/24 RA = 7 RV = 37/11 PA = 39/20 (28) PCW = 13 Fick cardiac output/index = 5.2/2.3 PVR = 2.9 WU FA sat = 91% PA sat = 63%  Review of systems complete and found to be negative unless listed in HPI.    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   Requires supplemental oxygen   Current Outpatient Medications  Medication Sig Dispense Refill   albuterol  (PROVENTIL ) (2.5 MG/3ML) 0.083% nebulizer solution TAKE 3 MLS BY NEBULIZATION EVERY 4 HOURS AS NEEDED FOR WHEEZING OR SHORTNESS OF BREATH 150 mL 11   allopurinol  (ZYLOPRIM ) 300 MG tablet Take 1 tablet (300 mg total) by mouth daily. 90 tablet 0   ALPRAZolam  (XANAX ) 0.5 MG tablet Take 1 tablet (0.5 mg total) by mouth 3 (three) times daily as needed for anxiety. 270 tablet 0   azelastine  (ASTELIN ) 0.1 % nasal spray Place 2 sprays into both nostrils 2 (two) times daily. Use in each nostril as directed 30 mL 1   B Complex-C-Biotin-D-Zinc-FA (VITAL-D RX PO) Take 1 tablet by mouth daily.     baclofen  (LIORESAL ) 10 MG tablet Take 1 tablet (10 mg total) by mouth  3 (three) times daily. 30 each 0   DALIRESP  500 MCG TABS tablet Take 500 mcg by mouth daily.     diclofenac Sodium (VOLTAREN) 1 % GEL Apply 1 Application topically in the morning and at bedtime.     EPINEPHrine  0.3 mg/0.3 mL IJ SOAJ injection Inject 0.3 mg into the muscle as needed for anaphylaxis. 1 each 0   fluticasone  (FLONASE ) 50 MCG/ACT nasal spray SHAKE LIQUID AND USE 2 SPRAYS IN EACH NOSTRIL DAILY 16 g 2   guaiFENesin  (MUCINEX ) 600 MG 12 hr tablet Take 1 tablet (600 mg total) by mouth 2 (two) times daily. 60 tablet 5   ipratropium (ATROVENT ) 0.06 % nasal spray Place 2 sprays into both nostrils 4 (four) times daily as needed for rhinitis. 45 mL 3   ipratropium-albuterol  (DUONEB) 0.5-2.5 (3) MG/3ML SOLN Take 3 mLs by nebulization every 4 (four) hours as needed. 360 mL 5   loratadine (CLARITIN) 10 MG tablet Take 10 mg by mouth every other day. As needed     methocarbamol  (ROBAXIN ) 500 MG tablet Take 1 tablet (500 mg total) by mouth every 8 (eight) hours as needed for muscle spasms. 30 tablet 1   metolazone  (ZAROXOLYN ) 2.5 MG tablet Take 1 tablet (2.5 mg total) by mouth 2 (two) times a week. Take 2.5 mg on Monday/Friday 8 tablet 11   mometasone -formoterol  (DULERA ) 100-5 MCG/ACT AERO INHALE 2 PUFFS INTO THE LUNGS TWICE DAILY 13 g 5   montelukast  (SINGULAIR ) 10 MG tablet TAKE 1 TABLET BY MOUTH EVERY DAY AT BEDTIME 30 tablet 5   nicotine  (NICODERM CQ ) 14 mg/24hr patch Place 1 patch (14 mg total) onto the skin daily. 28 patch 1   omeprazole  (PRILOSEC) 20 MG capsule TAKE 1 CAPSULE(20 MG) BY MOUTH TWICE DAILY BEFORE A MEAL FOR STOMACH ACID OR REFLUX 180 capsule 0   ondansetron  (ZOFRAN -ODT) 4 MG disintegrating tablet Take 1 tablet (4 mg total) by mouth every 8 (eight) hours as needed for nausea or vomiting. 20 tablet 0   oxyCODONE  (OXY IR/ROXICODONE ) 5 MG immediate release tablet Take 1 tablet (5 mg total) by mouth every 8 (eight) hours as needed for severe pain (pain score 7-10). 90 tablet 0    potassium chloride  SA (KLOR-CON  M) 20 MEQ tablet Take 40 mEq by mouth 2 (two) times daily.     predniSONE  (DELTASONE ) 10 MG tablet Take 1 tablet (10 mg total) by mouth daily with breakfast. 90 tablet 1   SPIRIVA  RESPIMAT 2.5 MCG/ACT AERS INHALE 2 PUFFS BY MOUTH DAILY 60 each 2   torsemide  (DEMADEX ) 20 MG tablet Take 2 tablets (40 mg total) by mouth daily. 90 tablet 11   VENTOLIN  HFA 108 (90 Base)  MCG/ACT inhaler INHALE 2 PUFFS INTO THE LUNGS EVERY 6 HOURS AS NEEDED FOR WHEEZING 18 g 4   vitamin B-12 (CYANOCOBALAMIN ) 1000 MCG tablet Take 1,000 mcg by mouth daily.     VITAMIN D , CHOLECALCIFEROL, PO Take 1 tablet by mouth daily.     vitamin E 180 MG (400 UNITS) capsule Take 400 Units by mouth daily.     Ensifentrine  (OHTUVAYRE ) 3 MG/2.5ML SUSP Inhale 3 mg into the lungs 2 (two) times daily. (Patient not taking: Reported on 06/30/2024) 150 mL 5   losartan  (COZAAR ) 25 MG tablet Take 0.5 tablets (12.5 mg total) by mouth at bedtime. (Patient not taking: Reported on 06/30/2024) 45 tablet 3   No current facility-administered medications for this encounter.   Allergies  Allergen Reactions   Bee Venom Shortness Of Breath and Swelling   Cefdinir  Other (See Comments)    Patient states he couldn't really breath    Spironolactone  Anaphylaxis   Bactrim  [Sulfamethoxazole -Trimethoprim ] Other (See Comments)    Mouth burning   Daliresp  [Roflumilast ] Other (See Comments)    Dizzy, headache, leg pain per patient. 02/25/22. Tolerates in low doses    Jardiance  [Empagliflozin ] Nausea Only and Other (See Comments)    Lightheadness Dizziness   Penicillins Swelling and Other (See Comments)    Childhood rxn--MD stated he almost died Has patient had a PCN reaction causing immediate rash, facial/tongue/throat swelling, SOB or lightheadedness with hypotension:Yes Has patient had a PCN reaction causing severe rash involving mucus membranes or skin necrosis:unsure Has patient had a PCN reaction that required  hospitalization:unsure Has patient had a PCN reaction occurring within the last 10 years:No If all of the above answers are NO, then may proceed with Cephalosporin use.     Clindamycin Other (See Comments)    Tongue swelling   Doxycycline  Nausea And Vomiting    Severe stomach upset per patient   E-Mycin [Erythromycin Base] Swelling   Rosuvastatin Other (See Comments)    Myalgias (intolerance)   Social History   Socioeconomic History   Marital status: Single    Spouse name: Not on file   Number of children: 1   Years of education: 54   Highest education level: 12th grade  Occupational History   Occupation: disabled  Tobacco Use   Smoking status: Former    Current packs/day: 0.00    Average packs/day: 2.0 packs/day for 46.0 years (92.0 ttl pk-yrs)    Types: Cigarettes    Start date: 01/28/1976    Quit date: 01/27/2022    Years since quitting: 2.4    Passive exposure: Past   Smokeless tobacco: Never   Tobacco comments:    Chews on a cigar.  09/16/2023 hfb        Smokes one occasionally per pt. 03/30/24  Vaping Use   Vaping status: Never Used  Substance and Sexual Activity   Alcohol use: Not Currently    Alcohol/week: 28.0 standard drinks of alcohol    Types: 28 Cans of beer per week   Drug use: No   Sexual activity: Yes    Partners: Female    Birth control/protection: None  Other Topics Concern   Not on file  Social History Narrative   Right handed   Tea sometimes and coffee (1/2 caffeine)   Lives alone-2025   Social Drivers of Health   Financial Resource Strain: Medium Risk (06/20/2024)   Overall Financial Resource Strain (CARDIA)    Difficulty of Paying Living Expenses: Somewhat hard  Food Insecurity: Patient Declined (06/20/2024)  Hunger Vital Sign    Worried About Running Out of Food in the Last Year: Patient declined    Ran Out of Food in the Last Year: Patient declined  Transportation Needs: No Transportation Needs (06/20/2024)   PRAPARE - Therapist, art (Medical): No    Lack of Transportation (Non-Medical): No  Physical Activity: Inactive (06/20/2024)   Exercise Vital Sign    Days of Exercise per Week: 0 days    Minutes of Exercise per Session: Not on file  Stress: Stress Concern Present (06/20/2024)   [employer] of Occupational Health - Occupational Stress Questionnaire    Feeling of Stress: Very much  Social Connections: Socially Isolated (06/20/2024)   Social Connection and Isolation Panel    Frequency of Communication with Friends and Family: More than three times a week    Frequency of Social Gatherings with Friends and Family: More than three times a week    Attends Religious Services: Patient declined    Database administrator or Organizations: No    Attends Engineer, structural: Not on file    Marital Status: Divorced  Intimate Partner Violence: Not At Risk (06/02/2023)   Humiliation, Afraid, Rape, and Kick questionnaire    Fear of Current or Ex-Partner: No    Emotionally Abused: No    Physically Abused: No    Sexually Abused: No   Family History  Problem Relation Age of Onset   Lung cancer Father    High blood pressure Mother    Diabetes Maternal Grandmother    Colon polyps Neg Hx    Esophageal cancer Neg Hx    Pancreatic cancer Neg Hx    Stomach cancer Neg Hx    Wt Readings from Last 3 Encounters:  06/30/24 105.2 kg (232 lb)  06/22/24 105 kg (231 lb 6.4 oz)  05/23/24 105.4 kg (232 lb 6.4 oz)   BP 110/84   Pulse 90   Wt 105.2 kg (232 lb)   SpO2 93%   BMI 33.29 kg/m   PHYSICAL EXAM: General:  NAD. No resp difficulty, arrived in [facility] HEENT: Normal Neck: Supple. No JVD. Cor: Regular rate & rhythm. No rubs, gallops or murmurs. Lungs: Faint wheezing throughout all lung fields. Abdomen: Soft, nontender, nondistended.  Extremities: No cyanosis, clubbing, rash, edema Neuro: Alert & oriented x 3, moves all 4 extremities w/o difficulty. Affect pleasant.  ICD interrogation  (personally reviewed): CorVue OK, >99% BiV pacing, <1% AT/AF burden  ASSESSMENT & PLAN: 1. Chronic systolic HF due to NICM  - Suspect NICM due to HTN and inadequately treated OSA (He now reports improved nightly compliance w/ CPAP). - Echo 1/22: EF 40-45%. RV mildly reduced. RVSP 49 - Echo 07/31/21: EF 30-35 % and RV moderately reduced.  - R/LHC 9/22 No CAD EF 35%, Mildly elevated filling pressures with normal output - BiV upgrade 11/11/22. C/b LYE DVT and started on Eliquis .  - s/p Barostim. - Echo 1/24: EF < 20% - RHC 3/24:  RA 7 PA 39/20 (28) PCW 13 Fick 5.2/2.3 - Echo 6/24: EF 35-40% RV low normal  (EF improving with CRT) - Echo 6/25: EF 20-25%, mild LVH, G2DD, normal RV. - NYHA IIIb, confounded by COPD + chest wall hernia + obesity + chronic pain. GDMT limited by multiple side effects. - Volume OK on device interrogation and exam today. - Continue torsemide  40 mg daily + metolazone  2.5 mg 2x/week (M + F) - Intolerant losartan  due to dizziness  -  No spiro (h/o Anaphylaxis).  - Intolerant to Jardiance  (positional dizziness and nausea).  - Intolerant bisoprolol  due to fatigue, coreg  due to dizziness, and toprol  due to dry mouth.  - Focus has been on tight volume management and CRT - Labs today. - Most recent echo showed EF down to 20-25%, from previous 35-40%. Repeat echo next visit. No CP, ? Stress illness from chest wall hernia contributing.  2.  HTN - BP stable - Continue current regimen. - Labs today   3. OSA - Compliant w/ BiPAP   4. Chest wall hernia - has seen TCTS.  - Not surgical candidate  5. Tobacco and ETOH use - Encouraged cessation  Follow up in 2 months with Dr. Cherrie + echo  Harlene CHRISTELLA Gainer, FNP-BC 2:22 PM

## 2024-06-27 DIAGNOSIS — J449 Chronic obstructive pulmonary disease, unspecified: Secondary | ICD-10-CM | POA: Diagnosis not present

## 2024-06-28 ENCOUNTER — Other Ambulatory Visit: Payer: Self-pay | Admitting: Internal Medicine

## 2024-06-28 DIAGNOSIS — F411 Generalized anxiety disorder: Secondary | ICD-10-CM

## 2024-06-28 MED ORDER — ALPRAZOLAM 0.5 MG PO TABS: 0.5000 mg | ORAL_TABLET | Freq: Three times a day (TID) | ORAL | 0 refills | Status: DC | PRN

## 2024-06-28 NOTE — Telephone Encounter (Signed)
 Copied from CRM [id number]. Topic: Clinical - Medication Refill >> Jun 28, 2024  9:55 AM Franky GRADE wrote: Medication: ALPRAZolam  (XANAX ) 0.5 MG tablet [[id number]]  Has the patient contacted their pharmacy? Yes, they asked patient to contact his primary care provider. (Agent: If no, request that the patient contact the pharmacy for the refill. If patient does not wish to contact the pharmacy document the reason why and proceed with request.) (Agent: If yes, when and what did the pharmacy advise?)  This is the patient's preferred pharmacy:  [id number] [place], [place] - [address] AT [facility] & [address] [place] [address] Phone: [phone] Fax: [phone]   Is this the correct pharmacy for this prescription? Yes If no, delete pharmacy and type the correct one.   Has the prescription been filled recently? No  Is the patient out of the medication? No  Has the patient been seen for an appointment in the last year OR does the patient have an upcoming appointment? Yes  Can we respond through MyChart? Yes  Agent: Please be advised that Rx refills may take up to 3 business days. We ask that you follow-up with your pharmacy.

## 2024-06-28 NOTE — Telephone Encounter (Signed)
 04/03/24 270 tabs/0 RF (90 day)

## 2024-06-29 ENCOUNTER — Other Ambulatory Visit: Payer: Self-pay | Admitting: Adult Health

## 2024-06-29 ENCOUNTER — Other Ambulatory Visit (HOSPITAL_COMMUNITY)

## 2024-06-29 ENCOUNTER — Telehealth: Payer: Self-pay

## 2024-06-29 ENCOUNTER — Encounter (HOSPITAL_COMMUNITY)

## 2024-06-29 NOTE — Telephone Encounter (Signed)
 Copied from CRM [id number]. Topic: Clinical - Prescription Issue >> Jun 29, 2024  1:44 PM Russell PARAS wrote: Reason for CRM:   Pt is contacting clinic to advise provider he is unable to get DALIRESP  refilled due to being on backorder at pharmacies in the area.   He is requesting if there is an alternative medication he can be prescribed to replace this medication. He is unable to use generic form of DALIRESP .   CB#  [phone]  Dr. Shelah can you please advise if there is an alternative you recommend

## 2024-06-29 NOTE — Telephone Encounter (Signed)
 I'm sorry but there is no similar substitute. If he can find a pharmacy in the region then we can certainly send script there for him until his local pharmacy obtains

## 2024-06-30 ENCOUNTER — Encounter: Payer: Self-pay | Admitting: Emergency Medicine

## 2024-06-30 ENCOUNTER — Encounter (HOSPITAL_COMMUNITY): Payer: Self-pay

## 2024-06-30 ENCOUNTER — Ambulatory Visit (HOSPITAL_COMMUNITY)
Admission: RE | Admit: 2024-06-30 | Discharge: 2024-06-30 | Disposition: A | Discharge status: Home / Self Care | Source: Ambulatory Visit | Attending: Family Medicine

## 2024-06-30 VITALS — BP 110/84 | HR 90 | Wt 232.0 lb

## 2024-06-30 DIAGNOSIS — E669 Obesity, unspecified: Secondary | ICD-10-CM | POA: Insufficient documentation

## 2024-06-30 DIAGNOSIS — Z720 Tobacco use: Secondary | ICD-10-CM

## 2024-06-30 DIAGNOSIS — I10 Essential (primary) hypertension: Secondary | ICD-10-CM | POA: Diagnosis not present

## 2024-06-30 DIAGNOSIS — J9859 Other diseases of mediastinum, not elsewhere classified: Secondary | ICD-10-CM | POA: Diagnosis not present

## 2024-06-30 DIAGNOSIS — I110 Hypertensive heart disease with heart failure: Secondary | ICD-10-CM | POA: Diagnosis not present

## 2024-06-30 DIAGNOSIS — G4733 Obstructive sleep apnea (adult) (pediatric): Secondary | ICD-10-CM | POA: Insufficient documentation

## 2024-06-30 DIAGNOSIS — I428 Other cardiomyopathies: Secondary | ICD-10-CM | POA: Diagnosis not present

## 2024-06-30 DIAGNOSIS — F1010 Alcohol abuse, uncomplicated: Secondary | ICD-10-CM

## 2024-06-30 DIAGNOSIS — Z6833 Body mass index (BMI) 33.0-33.9, adult: Secondary | ICD-10-CM | POA: Diagnosis not present

## 2024-06-30 DIAGNOSIS — J449 Chronic obstructive pulmonary disease, unspecified: Secondary | ICD-10-CM | POA: Diagnosis not present

## 2024-06-30 DIAGNOSIS — F1090 Alcohol use, unspecified, uncomplicated: Secondary | ICD-10-CM | POA: Insufficient documentation

## 2024-06-30 DIAGNOSIS — I5022 Chronic systolic (congestive) heart failure: Secondary | ICD-10-CM | POA: Insufficient documentation

## 2024-06-30 DIAGNOSIS — F17210 Nicotine dependence, cigarettes, uncomplicated: Secondary | ICD-10-CM | POA: Diagnosis not present

## 2024-06-30 LAB — BASIC METABOLIC PANEL WITH GFR
Anion gap: 13 (ref 5–15)
BUN: 12 mg/dL (ref 8–23)
CO2: 25 mmol/L (ref 22–32)
Calcium: 8.9 mg/dL (ref 8.9–10.3)
Chloride: 99 mmol/L (ref 98–111)
Creatinine, Ser: 0.8 mg/dL (ref 0.61–1.24)
GFR, Estimated: >60 mL/min (ref >60)
Glucose, Bld: 134 mg/dL — ABNORMAL HIGH (ref 70–99)
Potassium: 4.3 mmol/L (ref 3.5–5.1)
Sodium: 137 mmol/L (ref 135–145)

## 2024-06-30 LAB — URIC ACID: Uric Acid, Serum: 12.3 mg/dL — ABNORMAL HIGH (ref 3.7–8.6)

## 2024-06-30 LAB — BRAIN NATRIURETIC PEPTIDE: B Natriuretic Peptide: 270.3 pg/mL — ABNORMAL HIGH (ref 0.0–100.0)

## 2024-06-30 MED ORDER — ROFLUMILAST 500 MCG PO TABS: 500.0000 ug | ORAL_TABLET | Freq: Every day | ORAL | 11 refills | Status: DC

## 2024-06-30 NOTE — Patient Instructions (Signed)
 There has been no changes to your medications.  Labs done today, your results will be available in MyChart, we will contact you for abnormal readings.  Your physician has requested that you have an echocardiogram. Echocardiography is a painless test that uses sound waves to create images of your heart. It provides your doctor with information about the size and shape of your heart and how well your heart's chambers and valves are working. This procedure takes approximately one hour. There are no restrictions for this procedure. Please do NOT wear cologne, perfume, aftershave, or lotions (deodorant is allowed). Please arrive 15 minutes prior to your appointment time.  Please note: We ask at that you not bring children with you during ultrasound (echo/ vascular) testing. Due to room size and safety concerns, children are not allowed in the ultrasound rooms during exams. Our front office staff cannot provide observation of children in our lobby area while testing is being conducted. An adult accompanying a patient to their appointment will only be allowed in the ultrasound room at the discretion of the ultrasound technician under special circumstances. We apologize for any inconvenience.  Your physician recommends that you schedule a follow-up appointment as scheduled.  If you have any questions or concerns before your next appointment please send us  a message through [place] or call our office at [phone].    TO LEAVE A MESSAGE FOR THE NURSE SELECT OPTION 2, PLEASE LEAVE A MESSAGE INCLUDING: YOUR NAME DATE OF BIRTH CALL BACK NUMBER REASON FOR CALL**this is important as we prioritize the call backs  YOU WILL RECEIVE A CALL BACK THE SAME DAY AS LONG AS YOU CALL BEFORE 4:00 PM  At the [facility], you and your health needs are our priority. As part of our continuing mission to provide you with exceptional heart care, we have created designated Provider Care Teams. These Care  Teams include your primary Cardiologist (physician) and Advanced Practice Providers (APPs- Physician Assistants and Nurse Practitioners) who all work together to provide you with the care you need, when you need it.   You may see any of the following providers on your designated Care Team at your next follow up: Dr Toribio Fuel Dr Ezra Shuck Dr. Ria Commander Dr. Morene Brownie Amy Lenetta, NP Caffie Shed, [place] [facility] [place], [place] Beckey Coe, NP [place] Lee, NP Ellouise Class, NP Tinnie Redman, PharmD Jaun Bash, PharmD   Please be sure to bring in all your medications bottles to every appointment.    Thank you for choosing [facility]

## 2024-06-30 NOTE — Telephone Encounter (Signed)
 Rx sent to pharmacy

## 2024-06-30 NOTE — Telephone Encounter (Signed)
 Tried calling the patient, no answer- left detailed vm per DPR. Sending MyChart message regarding RB note.

## 2024-06-30 NOTE — Addendum Note (Signed)
 Addended byBETHA FRIES, Kalden Wanke A on: 06/30/2024 11:21 AM   Modules accepted: Orders

## 2024-06-30 NOTE — Telephone Encounter (Signed)
 Dr. Byrum is it OK for patient to use generic brand instead

## 2024-06-30 NOTE — Telephone Encounter (Signed)
 Yes, but he had problems w it in the past

## 2024-06-30 NOTE — Telephone Encounter (Signed)
 Patient is returning a call from cma concerning medication . Relayed to patient that cma say she left a detailed message and also sent a mychart. Patient is saying he wanted to talk to cma to see if he can take the generic brand instead. Patient says he got have something for his copd [phone]

## 2024-07-03 ENCOUNTER — Ambulatory Visit (HOSPITAL_COMMUNITY): Payer: Self-pay | Admitting: Family Medicine

## 2024-07-04 ENCOUNTER — Other Ambulatory Visit (HOSPITAL_COMMUNITY): Payer: Self-pay

## 2024-07-04 NOTE — Telephone Encounter (Signed)
 Copied from CRM [id number]. Topic: Clinical - Prescription Issue >> Jul 03, 2024  4:58 PM Rilla B wrote: Reason for CRM: Patient calling and wants to speak with Marcus. Please call. He states it's too much to explain but it's regarding a prescription.  Please call patient.  ----------------------------------------------------------------------- From previous Reason for Contact - Other: Reason for CRM:  Spoke with patient in previous note.

## 2024-07-04 NOTE — Telephone Encounter (Signed)
 Medication showing as being filled for the generic on 07/11-no PA needed.

## 2024-07-04 NOTE — Telephone Encounter (Signed)
 Called and spoke to patient. Pt states pharmacy has Daliresp  in stock but is unable to provide to patient due to prior auth/insurance issues needed per patient understanding. Routing to pharmacy to see if you can advise any information regarding this.  Pt states he is more sob since using generic brand roflumilast .

## 2024-07-05 NOTE — Telephone Encounter (Signed)
 He already had an existing PA approved, so I am not sure why he is being asked to do this again -  we changed him to the generic (which he tolerates poorly, gives him side effects) temporarily due to name-brand supply chain and unavailability.   If his insurance insists on having a new PA, then please start the paperwork process so we can complete.

## 2024-07-05 NOTE — Addendum Note (Signed)
 Addended by: TAWNI DRILLING D on: 07/05/2024 03:10 PM   Modules accepted: Orders

## 2024-07-05 NOTE — Progress Notes (Signed)
 Remote pacemaker transmission.

## 2024-07-05 NOTE — Telephone Encounter (Signed)
 I message Juliana (pharmacist) to get some more information regarding this situation her messages are below, So he wants the generic because the Brand is not available right now-but it's already filled at the [facility] pharmacy...[email or web address] he's also saying the generic doesn't work and he needs a PA on the Brand.SABRA...which is not available so doing a PA wouldn't get us  anywhere.  So he probably paid out of pocket for the generic for a few pills (because insurance has paid for it on the 07/11 fill) and he probably hasn't picked it up or doesnt want to take it  Pretty much his only option is to get the generic and it is filled at the [facility] so he can pick that up or get it transferred to another [facility] within their system.  I called the patient and relayed this information to him and he stated how I need to contact insurance to get them to pay for it   I have contacted pts insurance at [phone] to verify why patient can't use generic and that he needs Brand Daliresp . Ted from [employer] stated insurance has everything they need and they are ready to pay for it and that the pharmacy needs to submit a vacation override for patient to obtain Daliresp  before next month.   I called the pharmacy and notified them of Ted's message and they stated vacation override failed and will still not allow patient to fill for Daliresp  since generic was filled on 7/11.   Called the patient back and advised of this information and pt stated he would probably end up paying out of pocket till next month when he can get Daliresp  filled.

## 2024-07-19 ENCOUNTER — Encounter: Payer: Self-pay | Admitting: Emergency Medicine

## 2024-07-19 ENCOUNTER — Telehealth: Admitting: Emergency Medicine

## 2024-07-19 ENCOUNTER — Ambulatory Visit: Admitting: Emergency Medicine

## 2024-07-19 DIAGNOSIS — J441 Chronic obstructive pulmonary disease with (acute) exacerbation: Secondary | ICD-10-CM

## 2024-07-19 DIAGNOSIS — F17210 Nicotine dependence, cigarettes, uncomplicated: Secondary | ICD-10-CM

## 2024-07-19 DIAGNOSIS — J9611 Chronic respiratory failure with hypoxia: Secondary | ICD-10-CM

## 2024-07-19 DIAGNOSIS — J984 Other disorders of lung: Secondary | ICD-10-CM

## 2024-07-19 MED ORDER — PREDNISONE 10 MG PO TABS
ORAL_TABLET | ORAL | 0 refills | Status: DC
Start: 2024-07-19 — End: 2024-09-06

## 2024-07-19 NOTE — Progress Notes (Signed)
 Virtual Visit via Video Note  I connected with Robert Church on 07/19/24 at  1:30 PM EDT by a video enabled telemedicine application and verified that I am speaking with the correct person using two identifiers.  Location: Patient: Home Provider: Office   I discussed the limitations of evaluation and management by telemedicine and the availability of in person appointments. The patient expressed understanding and agreed to proceed.  History of Present Illness: 68 year old chronically ill gentleman with multifactorial chronic hypoxemic respiratory failure.  He has severe COPD and continues to smoke.  He also has ischemic cardiomyopathy with third-degree heart block and a pacemaker and is followed in the advanced heart failure clinic.  He has obesity hypoventilation syndrome that has been managed on a trilogy ventilator.  He is on 10 mg of prednisone , Dulera , Spiriva .  Is been on Daliresp  but has had some difficulty back-and-forth with his pharmacy regarding the generic version versus the name brand version.  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  He has seen [facility] about this but is high risk for any kind procedure.   Observations/Objective: He tried Ohtuvayre  but was unable to tolerate - made him more SOB, shaky. Only took it twice. He is having increased cough, minimal mucous production. He has more SOB, had a URI recently.  The pharmacy has filled his daliresp  - he is back on it.   Assessment and Plan: COPD with acute flare and in the aftermath of recent viral URI. - continue BD's as ordered - Hold off on retrying Ohtuvayre  for now although I think it would be reasonable to give it another try since he only took 2 doses - mucinex  - pred taper back down to his usual 10 mg daily - doxycycline  for 1 week    Follow Up Instructions: - Follow-up in person or virtual visit in 2-3 weeks   I discussed the assessment and treatment plan with the patient. The patient was  provided an opportunity to ask questions and all were answered. The patient agreed with the plan and demonstrated an understanding of the instructions.   The patient was advised to call back or seek an in-person evaluation if the symptoms worsen or if the condition fails to improve as anticipated.  I provided 20 minutes of non-face-to-face time during this encounter.   Lamar GORMAN Chris, MD

## 2024-07-24 ENCOUNTER — Ambulatory Visit: Admitting: Dermatology

## 2024-07-27 DIAGNOSIS — J449 Chronic obstructive pulmonary disease, unspecified: Secondary | ICD-10-CM | POA: Diagnosis not present

## 2024-07-27 DIAGNOSIS — J9610 Chronic respiratory failure, unspecified whether with hypoxia or hypercapnia: Secondary | ICD-10-CM | POA: Diagnosis not present

## 2024-07-28 DIAGNOSIS — J449 Chronic obstructive pulmonary disease, unspecified: Secondary | ICD-10-CM | POA: Diagnosis not present

## 2024-08-02 ENCOUNTER — Ambulatory Visit (HOSPITAL_BASED_OUTPATIENT_CLINIC_OR_DEPARTMENT_OTHER)

## 2024-08-02 ENCOUNTER — Encounter (HOSPITAL_BASED_OUTPATIENT_CLINIC_OR_DEPARTMENT_OTHER): Payer: Self-pay

## 2024-08-02 VITALS — BP 127/84 | HR 85 | Ht 70.0 in | Wt 234.0 lb

## 2024-08-02 DIAGNOSIS — F17210 Nicotine dependence, cigarettes, uncomplicated: Secondary | ICD-10-CM

## 2024-08-02 DIAGNOSIS — J449 Chronic obstructive pulmonary disease, unspecified: Secondary | ICD-10-CM | POA: Diagnosis not present

## 2024-08-02 DIAGNOSIS — J9611 Chronic respiratory failure with hypoxia: Secondary | ICD-10-CM | POA: Diagnosis not present

## 2024-08-02 NOTE — Progress Notes (Signed)
 @Patient  ID: Robert Church, male    DOB: 1956-09-10, 68 y.o.   MRN: [id number]  Chief Complaint  Patient presents with   Follow-up    COPD     Referring provider: Joshua Debby CROME, MD  HPI: Robert Church is a 68 y/o male current smoker with PMH of chronic hypoxemic respiratory failure, severe COPD, ischemic cardiomyopathy w/ 3rd degree heart block s/p pacemaker (followed at advanced heart failure clinic), obesity hypoventilation syndrome managed on Trilogy ventilator who presents for follow up after recent treatment for acute flare of COPD.  He was last seen via telemedicine on 07/19/2024.  He reports that he finished his course of steroids and doxycycline .  He also reports that he continues to smoke.  He still has some difficulty with cough that is productive of clear to white sputum at times.  He reports that he feels mostly back to his baseline at this point.  He continues to use his Trelegy ventilator at night..  He denies chest pain, fever, chills, changes in sputum, night sweats, weight changes.  TEST/EVENTS :   Allergies  Allergen Reactions   Bee Venom Shortness Of Breath and Swelling   Cefdinir  Other (See Comments)    Patient states he couldn't really breath    Spironolactone  Anaphylaxis   Bactrim  [Sulfamethoxazole -Trimethoprim ] Other (See Comments)    Mouth burning   Daliresp  [Roflumilast ] Other (See Comments)    Dizzy, headache, leg pain per patient. 02/25/22. Tolerates in low doses    Jardiance  [Empagliflozin ] Nausea Only and Other (See Comments)    Lightheadness Dizziness   Penicillins Swelling and Other (See Comments)    Childhood rxn--MD stated he almost died Has patient had a PCN reaction causing immediate rash, facial/tongue/throat swelling, SOB or lightheadedness with hypotension:Yes Has patient had a PCN reaction causing severe rash involving mucus membranes or skin necrosis:unsure Has patient had a PCN reaction that required hospitalization:unsure Has  patient had a PCN reaction occurring within the last 10 years:No If all of the above answers are NO, then may proceed with Cephalosporin use.     Clindamycin Other (See Comments)    Tongue swelling   Doxycycline  Nausea And Vomiting    Severe stomach upset per patient   E-Mycin [Erythromycin Base] Swelling   Rosuvastatin Other (See Comments)    Myalgias (intolerance)    Immunization History  Administered Date(s) Administered   Influenza,inj,Quad PF,6+ Mos 09/17/2021   PNEUMOCOCCAL CONJUGATE-20 08/20/2021   Tdap 12/03/2011    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   Requires supplemental oxygen      Tobacco History: Social History   Tobacco Use  Smoking Status Former   Current packs/day: 0.00   Average packs/day: 2.0 packs/day for 46.0 years (92.0 ttl pk-yrs)   Types: Cigarettes   Start date: 01/28/1976   Quit date: 01/27/2022   Years since quitting: 2.5   Passive exposure: Past  Smokeless Tobacco Never  Tobacco Comments   Chews on a cigar.  09/16/2023 hfb      Smokes one occasionally per pt. 03/30/24   Counseling given: Not Answered Tobacco comments: Chews on a cigar.  09/16/2023 hfb  Smokes one occasionally per pt. 03/30/24   Outpatient Medications Prior to Visit  Medication Sig Dispense Refill   albuterol  (PROVENTIL ) (2.5 MG/3ML) 0.083% nebulizer solution TAKE 3 MLS BY NEBULIZATION EVERY 4 HOURS AS NEEDED FOR WHEEZING OR SHORTNESS OF BREATH 150 mL 11   allopurinol  (ZYLOPRIM ) 300 MG tablet Take 1 tablet (300 mg  total) by mouth daily. 90 tablet 0   ALPRAZolam  (XANAX ) 0.5 MG tablet Take 1 tablet (0.5 mg total) by mouth 3 (three) times daily as needed for anxiety. 270 tablet 0   azelastine  (ASTELIN ) 0.1 % nasal spray Place 2 sprays into both nostrils 2 (two) times daily. Use in each nostril as directed 30 mL 1   B Complex-C-Biotin-D-Zinc-FA (VITAL-D RX PO) Take 1 tablet by mouth daily.     baclofen  (LIORESAL ) 10 MG tablet Take 1 tablet (10 mg total) by mouth 3 (three) times daily. 30 each 0   DALIRESP  500 MCG TABS tablet Take 500 mcg by mouth daily.     diclofenac Sodium (VOLTAREN) 1 % GEL Apply 1 Application topically in the morning and at bedtime.     Ensifentrine  (OHTUVAYRE ) 3 MG/2.5ML SUSP Inhale 3 mg into the lungs 2 (two) times daily. 150 mL 5   EPINEPHrine  0.3 mg/0.3 mL IJ SOAJ injection Inject 0.3 mg into the muscle as needed for anaphylaxis. 1 each 0   fluticasone  (FLONASE ) 50 MCG/ACT nasal spray SHAKE LIQUID AND USE 2 SPRAYS IN EACH NOSTRIL DAILY 16 g 2   guaiFENesin  (MUCINEX ) 600 MG 12 hr tablet Take 1 tablet (600 mg total) by mouth 2 (two) times daily. 60 tablet 5   ipratropium (ATROVENT ) 0.06 % nasal spray Place 2 sprays into both nostrils 4 (four) times daily as needed for rhinitis. 45 mL 3   ipratropium-albuterol  (DUONEB) 0.5-2.5 (3) MG/3ML SOLN Take 3 mLs by nebulization every 4 (four) hours as needed. 360 mL 5   loratadine (CLARITIN) 10 MG tablet Take 10 mg by mouth every other day. As needed     losartan  (COZAAR ) 25 MG tablet Take 0.5 tablets (12.5 mg total) by mouth at bedtime. 45 tablet 3   methocarbamol  (ROBAXIN ) 500 MG tablet Take 1 tablet (500 mg total) by mouth every 8 (eight) hours as needed for muscle spasms. 30 tablet 1   metolazone  (ZAROXOLYN ) 2.5 MG tablet Take 1 tablet (2.5 mg total) by mouth 2 (two) times a week. Take 2.5 mg on Monday/Friday 8 tablet 11   mometasone -formoterol  (DULERA ) 100-5 MCG/ACT AERO INHALE 2 PUFFS INTO THE LUNGS TWICE DAILY 13 g 5   montelukast  (SINGULAIR )  10 MG tablet TAKE 1 TABLET BY MOUTH EVERY DAY AT BEDTIME 30 tablet 5   nicotine  (NICODERM CQ ) 14 mg/24hr patch Place 1 patch (14 mg total) onto the skin daily. 28 patch 1   omeprazole  (PRILOSEC) 20 MG capsule TAKE 1 CAPSULE(20 MG) BY MOUTH TWICE DAILY BEFORE A MEAL FOR STOMACH ACID OR REFLUX 180 capsule 0   ondansetron  (ZOFRAN -ODT) 4 MG disintegrating tablet Take 1 tablet (4 mg total) by mouth every 8 (eight) hours as needed for nausea or vomiting. 20 tablet 0   oxyCODONE  (OXY IR/ROXICODONE ) 5 MG immediate release tablet Take 1 tablet (5 mg total) by mouth every 8 (eight) hours as needed for  severe pain (pain score 7-10). 90 tablet 0   potassium chloride  SA (KLOR-CON  M) 20 MEQ tablet Take 40 mEq by mouth 2 (two) times daily.     predniSONE  (DELTASONE ) 10 MG tablet Take 1 tablet (10 mg total) by mouth daily with breakfast. 90 tablet 1   predniSONE  (DELTASONE ) 10 MG tablet Take 40mg  daily for 3 days, then 30mg  daily for 3 days, then 20mg  daily for 3 days, then back to 10mg  daily 27 tablet 0   SPIRIVA  RESPIMAT 2.5 MCG/ACT AERS INHALE 2 PUFFS BY MOUTH DAILY 60 each 2   torsemide  (DEMADEX ) 20 MG tablet Take 2 tablets (40 mg total) by mouth daily. 90 tablet 11   VENTOLIN  HFA 108 (90 Base) MCG/ACT inhaler INHALE 2 PUFFS INTO THE LUNGS EVERY 6 HOURS AS NEEDED FOR WHEEZING 18 g 4   vitamin B-12 (CYANOCOBALAMIN ) 1000 MCG tablet Take 1,000 mcg by mouth daily.     VITAMIN D , CHOLECALCIFEROL, PO Take 1 tablet by mouth daily.     vitamin E 180 MG (400 UNITS) capsule Take 400 Units by mouth daily.     No facility-administered medications prior to visit.     Review of Systems:   Constitutional:   No  weight loss, night sweats,  Fevers, chills, fatigue, or  lassitude.  HEENT:   No headaches,  Difficulty swallowing,  Tooth/dental problems, or  Sore throat,                No sneezing, itching, ear ache, nasal congestion, post nasal drip,   CV:  No chest pain,  Orthopnea, PND, swelling in lower extremities,  anasarca, dizziness, palpitations, syncope.   GI  No heartburn, indigestion, abdominal pain, nausea, vomiting, diarrhea, change in bowel habits, loss of appetite, bloody stools.   Resp: No shortness of breath with exertion or at rest.  No excess mucus or change in color, No non-productive cough,  No coughing up of blood.  No change in color of mucus.  No wheezing.  No chest wall deformity.  Intermittently productive cough of clear to white sputum as per baseline.  Skin: no rash or lesions.  GU: no dysuria, change in color of urine, no urgency or frequency.  No flank pain, no hematuria   MS:  No joint pain or swelling.  No decreased range of motion.  No back pain.    Physical Exam  BP 127/84 (BP Location: Left Arm, Patient Position: Sitting)   Pulse 85   Ht 5' 10 (1.778 m)   Wt 234 lb (106.1 kg)   SpO2 93%   BMI 33.58 kg/m   GEN: A/Ox3; pleasant , NAD, well nourished    HEENT:  [place]/AT,  EACs-clear, TMs-wnl, NOSE-clear, THROAT-clear, no lesions, no postnasal drip or exudate noted.   NECK:  Supple w/ fair ROM; no JVD; normal carotid impulses w/o bruits; no thyromegaly or nodules palpated; no lymphadenopathy.    RESP few scattered expiratory wheezes throughout with fairly good aeration throughout.; w/o, / rales/ or rhonchi. no accessory muscle use, no dullness to percussion  CARD:  RRR, no m/r/g, no peripheral edema, pulses intact, no cyanosis or clubbing.  GI:   Soft & nt; nml bowel sounds; no organomegaly or masses detected.   Musco: Warm bil, no deformities or joint swelling noted.   Neuro: alert, no focal deficits noted.    Skin: Warm, no lesions or rashes    Lab Results:  CBC    Component Value Date/Time   WBC 8.4 01/07/2024 1432  RBC 4.35 01/07/2024 1432   HGB 14.1 01/07/2024 1432   HGB 15.1 02/26/2023 1223   HCT 42.0 01/07/2024 1432   HCT 45.0 02/26/2023 1223   PLT 279.0 01/07/2024 1432   PLT 216 02/26/2023 1223   MCV 96.5 01/07/2024 1432   MCV 94  02/26/2023 1223   MCH 32.7 06/02/2023 0108   MCHC 33.5 01/07/2024 1432   RDW 13.9 01/07/2024 1432   RDW 12.4 02/26/2023 1223   LYMPHSABS 0.5 (L) 01/07/2024 1432   LYMPHSABS 0.7 09/26/2021 1318   MONOABS 0.5 01/07/2024 1432   EOSABS 0.0 01/07/2024 1432   EOSABS 0.0 09/26/2021 1318   BASOSABS 0.0 01/07/2024 1432   BASOSABS 0.0 09/26/2021 1318    BMET    Component Value Date/Time   NA 137 06/30/2024 1517   NA 142 05/23/2024 1506   K 4.3 06/30/2024 1517   CL 99 06/30/2024 1517   CO2 25 06/30/2024 1517   GLUCOSE 134 (H) 06/30/2024 1517   BUN 12 06/30/2024 1517   BUN 11 05/23/2024 1506   CREATININE 0.80 06/30/2024 1517   CREATININE 1.04 07/10/2020 1035   CALCIUM  8.9 06/30/2024 1517   GFRNONAA >60 06/30/2024 1517   GFRAA >60 02/14/2020 1454    BNP    Component Value Date/Time   BNP 270.3 (H) 06/30/2024 1439    ProBNP    Component Value Date/Time   PROBNP 167.0 (H) 09/14/2022 1522    Imaging: No results found.  Administration History     None           No data to display          No results found for: NITRICOXIDE   Assessment & Plan:   Assessment & Plan Chronic obstructive pulmonary disease, unspecified COPD type (HCC) - Exacerbation seems to be resolved at this point. - Continue Dulera , Singulair , Spiriva . - May use albuterol  as needed for dyspnea - Stop Daliresp ; resume Ohtuvayre  Chronic respiratory failure with hypoxia (HCC) - Continue support with trilogy ventilator; patient uses it at night and for naps. - No change in therapy. Cigarette smoker - Affects all aspects of care. - Patient educated on smoking cessation at today's appointment.   Return in about 2 months (around 10/02/2024).  Candis Dandy, PA-C 08/02/2024

## 2024-08-02 NOTE — Patient Instructions (Addendum)
 Stop taking Daliresp .  Start taking Ohtuvayre  as prescribed.  Continue Dulera , Duonebs, and Singulair .  Use Albuterol  as needed.  Follow up with Cardiology as scheduled.  Follow up with Dr. Shelah in 2 months.

## 2024-08-02 NOTE — Assessment & Plan Note (Signed)
-   Affects all aspects of care. - Patient educated on smoking cessation at today's appointment.

## 2024-08-02 NOTE — Assessment & Plan Note (Signed)
-   Exacerbation seems to be resolved at this point. - Continue Dulera , Singulair , Spiriva . - May use albuterol  as needed for dyspnea - Stop Daliresp ; resume Ohtuvayre

## 2024-08-02 NOTE — Assessment & Plan Note (Signed)
-   Continue support with trilogy ventilator; patient uses it at night and for naps. - No change in therapy.

## 2024-08-03 ENCOUNTER — Other Ambulatory Visit: Payer: Self-pay | Admitting: Pulmonary Disease

## 2024-08-15 ENCOUNTER — Ambulatory Visit (INDEPENDENT_AMBULATORY_CARE_PROVIDER_SITE_OTHER): Payer: Medicare Other

## 2024-08-15 DIAGNOSIS — I442 Atrioventricular block, complete: Secondary | ICD-10-CM | POA: Diagnosis not present

## 2024-08-16 ENCOUNTER — Ambulatory Visit: Payer: Self-pay | Admitting: Cardiology

## 2024-08-16 LAB — CUP PACEART REMOTE DEVICE CHECK
Battery Remaining Longevity: 69 mo
Battery Remaining Percentage: 72 %
Battery Voltage: 2.99 V
Brady Statistic AP VP Percent: 1.2 %
Brady Statistic AP VS Percent: 1 %
Brady Statistic AS VP Percent: 98 %
Brady Statistic AS VS Percent: 1 %
Brady Statistic RA Percent Paced: 1.1 %
Date Time Interrogation Session: 20250826140613
Implantable Lead Connection Status: 753985
Implantable Lead Connection Status: 753985
Implantable Lead Connection Status: 753985
Implantable Lead Implant Date: 20220427
Implantable Lead Implant Date: 20220427
Implantable Lead Implant Date: 20231122
Implantable Lead Location: 753858
Implantable Lead Location: 753859
Implantable Lead Location: 753860
Implantable Pulse Generator Implant Date: 20231122
Lead Channel Impedance Value: 510 Ohm
Lead Channel Impedance Value: 530 Ohm
Lead Channel Impedance Value: 680 Ohm
Lead Channel Pacing Threshold Amplitude: 0.625 V
Lead Channel Pacing Threshold Amplitude: 0.75 V
Lead Channel Pacing Threshold Amplitude: 0.875 V
Lead Channel Pacing Threshold Pulse Width: 0.5 ms
Lead Channel Pacing Threshold Pulse Width: 0.5 ms
Lead Channel Pacing Threshold Pulse Width: 0.5 ms
Lead Channel Sensing Intrinsic Amplitude: 1.9 mV
Lead Channel Sensing Intrinsic Amplitude: 12 mV
Lead Channel Setting Pacing Amplitude: 1.75 V
Lead Channel Setting Pacing Amplitude: 2 V
Lead Channel Setting Pacing Amplitude: 2 V
Lead Channel Setting Pacing Pulse Width: 0.5 ms
Lead Channel Setting Pacing Pulse Width: 0.5 ms
Lead Channel Setting Sensing Sensitivity: 2 mV
Pulse Gen Model: 3222
Pulse Gen Serial Number: 3979797

## 2024-08-22 ENCOUNTER — Encounter: Payer: Self-pay | Admitting: Family Medicine

## 2024-08-22 ENCOUNTER — Ambulatory Visit (HOSPITAL_COMMUNITY)
Admission: RE | Admit: 2024-08-22 | Discharge: 2024-08-22 | Disposition: A | Discharge status: Home / Self Care | Source: Ambulatory Visit | Attending: Internal Medicine | Admitting: Internal Medicine

## 2024-08-22 DIAGNOSIS — I350 Nonrheumatic aortic (valve) stenosis: Secondary | ICD-10-CM | POA: Diagnosis not present

## 2024-08-22 DIAGNOSIS — I110 Hypertensive heart disease with heart failure: Secondary | ICD-10-CM | POA: Insufficient documentation

## 2024-08-22 DIAGNOSIS — E119 Type 2 diabetes mellitus without complications: Secondary | ICD-10-CM | POA: Diagnosis not present

## 2024-08-22 DIAGNOSIS — J449 Chronic obstructive pulmonary disease, unspecified: Secondary | ICD-10-CM | POA: Diagnosis not present

## 2024-08-22 DIAGNOSIS — I5042 Chronic combined systolic (congestive) and diastolic (congestive) heart failure: Secondary | ICD-10-CM | POA: Insufficient documentation

## 2024-08-22 DIAGNOSIS — I428 Other cardiomyopathies: Secondary | ICD-10-CM | POA: Insufficient documentation

## 2024-08-22 DIAGNOSIS — F17210 Nicotine dependence, cigarettes, uncomplicated: Secondary | ICD-10-CM | POA: Diagnosis not present

## 2024-08-22 LAB — ECHOCARDIOGRAM COMPLETE
Calc EF: 23.6 %
Est EF: 20
S' Lateral: 7.5 cm
Single Plane A2C EF: 23.3 %
Single Plane A4C EF: 22 %

## 2024-08-23 ENCOUNTER — Ambulatory Visit: Admitting: Emergency Medicine

## 2024-08-24 ENCOUNTER — Encounter (HOSPITAL_COMMUNITY): Payer: Self-pay | Admitting: Internal Medicine

## 2024-08-24 ENCOUNTER — Encounter: Payer: Self-pay | Admitting: Emergency Medicine

## 2024-08-24 ENCOUNTER — Ambulatory Visit: Admitting: Emergency Medicine

## 2024-08-24 VITALS — BP 118/78 | HR 89 | Temp 97.8°F | Ht 70.0 in | Wt 232.4 lb

## 2024-08-24 DIAGNOSIS — L03031 Cellulitis of right toe: Secondary | ICD-10-CM | POA: Diagnosis not present

## 2024-08-24 MED ORDER — DOXYCYCLINE HYCLATE 100 MG PO TABS: 100.0000 mg | ORAL_TABLET | Freq: Two times a day (BID) | ORAL | 0 refills | Status: AC

## 2024-08-24 NOTE — Progress Notes (Signed)
 Robert Church 68 y.o.   Chief Complaint  Patient presents with   Foot Injury    Patient states stepping on a staple with his left foot Friday morning 4am and didn't realize it till 8am that day. He also has a bug bite that happened 3 week ago on right foot great toe, states it healed over and wants it looked at.     HISTORY OF PRESENT ILLNESS: Acute problem visit today. This is a 68 y.o. male complaining of possible toe infection status post recent injury No other complaints or medical concerns today.  Foot Injury      Prior to Admission medications   Medication Sig Start Date End Date Taking? Authorizing Provider  albuterol  (PROVENTIL ) (2.5 MG/3ML) 0.083% nebulizer solution TAKE 3 MLS BY NEBULIZATION EVERY 4 HOURS AS NEEDED FOR WHEEZING OR SHORTNESS OF BREATH 10/22/23  Yes Byrum, Lamar RAMAN, MD  allopurinol  (ZYLOPRIM ) 300 MG tablet Take 1 tablet (300 mg total) by mouth daily. 04/03/24  Yes Joshua Debby CROME, MD  ALPRAZolam  (XANAX ) 0.5 MG tablet Take 1 tablet (0.5 mg total) by mouth 3 (three) times daily as needed for anxiety. 06/28/24  Yes Joshua Debby CROME, MD  azelastine  (ASTELIN ) 0.1 % nasal spray Place 2 sprays into both nostrils 2 (two) times daily. Use in each nostril as directed 02/15/24  Yes Hope Almarie ORN, NP  B Complex-C-Biotin-D-Zinc-FA (VITAL-D RX PO) Take 1 tablet by mouth daily.   Yes [provider]  baclofen  (LIORESAL ) 10 MG tablet Take 1 tablet (10 mg total) by mouth 3 (three) times daily. 02/29/24  Yes Alvia Corean CROME, FNP  DALIRESP  500 MCG TABS tablet Take 500 mcg by mouth daily. 05/31/24  Yes [provider]  diclofenac Sodium (VOLTAREN) 1 % GEL Apply 1 Application topically in the morning and at bedtime.   Yes [provider]  Ensifentrine  (OHTUVAYRE ) 3 MG/2.5ML SUSP Inhale 3 mg into the lungs 2 (two) times daily. 05/12/24  Yes Shelah Lamar RAMAN, MD  EPINEPHrine  0.3 mg/0.3 mL IJ SOAJ injection Inject 0.3 mg into the muscle as needed for  anaphylaxis. 07/13/22  Yes Joshua Debby CROME, MD  fluticasone  (FLONASE ) 50 MCG/ACT nasal spray SHAKE LIQUID AND USE 2 SPRAYS IN EACH NOSTRIL DAILY 06/29/22  Yes Byrum, Lamar RAMAN, MD  guaiFENesin  (MUCINEX ) 600 MG 12 hr tablet Take 1 tablet (600 mg total) by mouth 2 (two) times daily. 12/17/21  Yes Byrum, Robert S, MD  ipratropium (ATROVENT ) 0.06 % nasal spray Place 2 sprays into both nostrils 4 (four) times daily as needed for rhinitis. 03/17/23  Yes Shelah Lamar RAMAN, MD  ipratropium-albuterol  (DUONEB) 0.5-2.5 (3) MG/3ML SOLN Take 3 mLs by nebulization every 4 (four) hours as needed. 02/15/24  Yes Hope Almarie ORN, NP  loratadine (CLARITIN) 10 MG tablet Take 10 mg by mouth every other day. As needed   Yes [provider]  losartan  (COZAAR ) 25 MG tablet Take 0.5 tablets (12.5 mg total) by mouth at bedtime. 06/16/24  Yes Milford, Harlene CHRISTELLA, FNP  methocarbamol  (ROBAXIN ) 500 MG tablet Take 1 tablet (500 mg total) by mouth every 8 (eight) hours as needed for muscle spasms. 02/02/24  Yes Alvia Corean CROME, FNP  metolazone  (ZAROXOLYN ) 2.5 MG tablet Take 1 tablet (2.5 mg total) by mouth 2 (two) times a week. Take 2.5 mg on Monday/Friday 01/20/24  Yes Clegg, Amy D, NP  mometasone -formoterol  (DULERA ) 100-5 MCG/ACT AERO INHALE 2 PUFFS INTO THE LUNGS TWICE DAILY 08/03/24  Yes Charley Conger, PA-C  montelukast  (  SINGULAIR ) 10 MG tablet TAKE 1 TABLET BY MOUTH EVERY DAY AT BEDTIME 06/14/24  Yes Byrum, Lamar RAMAN, MD  nicotine  (NICODERM CQ ) 14 mg/24hr patch Place 1 patch (14 mg total) onto the skin daily. 06/22/24  Yes Joshua Debby CROME, MD  omeprazole  (PRILOSEC) 20 MG capsule TAKE 1 CAPSULE(20 MG) BY MOUTH TWICE DAILY BEFORE A MEAL FOR STOMACH ACID OR REFLUX 03/07/24  Yes Alvia Corean CROME, FNP  ondansetron  (ZOFRAN -ODT) 4 MG disintegrating tablet Take 1 tablet (4 mg total) by mouth every 8 (eight) hours as needed for nausea or vomiting. 01/07/24  Yes Alvia Corean CROME, FNP  oxyCODONE  (OXY IR/ROXICODONE ) 5 MG immediate  release tablet Take 1 tablet (5 mg total) by mouth every 8 (eight) hours as needed for severe pain (pain score 7-10). 06/22/24  Yes Joshua Debby CROME, MD  potassium chloride  SA (KLOR-CON  M) 20 MEQ tablet Take 40 mEq by mouth 2 (two) times daily. 03/01/24  Yes [provider]  predniSONE  (DELTASONE ) 10 MG tablet Take 1 tablet (10 mg total) by mouth daily with breakfast. 02/15/24  Yes Hope Almarie ORN, NP  predniSONE  (DELTASONE ) 10 MG tablet Take 40mg  daily for 3 days, then 30mg  daily for 3 days, then 20mg  daily for 3 days, then back to 10mg  daily 07/19/24  Yes Byrum, Lamar RAMAN, MD  SPIRIVA  RESPIMAT 2.5 MCG/ACT AERS INHALE 2 PUFFS BY MOUTH DAILY 06/29/24  Yes Byrum, Robert S, MD  torsemide  (DEMADEX ) 20 MG tablet Take 2 tablets (40 mg total) by mouth daily. 01/20/24  Yes Clegg, Amy D, NP  VENTOLIN  HFA 108 (90 Base) MCG/ACT inhaler INHALE 2 PUFFS INTO THE LUNGS EVERY 6 HOURS AS NEEDED FOR WHEEZING 05/08/24  Yes Byrum, Robert S, MD  vitamin B-12 (CYANOCOBALAMIN ) 1000 MCG tablet Take 1,000 mcg by mouth daily.   Yes [provider]  VITAMIN D , CHOLECALCIFEROL, PO Take 1 tablet by mouth daily.   Yes [provider]  vitamin E 180 MG (400 UNITS) capsule Take 400 Units by mouth daily.   Yes [provider]    Allergies  Allergen Reactions   Bee Venom Shortness Of Breath and Swelling   Cefdinir  Other (See Comments)    Patient states he couldn't really breath    Spironolactone  Anaphylaxis   Bactrim  [Sulfamethoxazole -Trimethoprim ] Other (See Comments)    Mouth burning   Daliresp  [Roflumilast ] Other (See Comments)    Dizzy, headache, leg pain per patient. 02/25/22. Tolerates in low doses    Jardiance  [Empagliflozin ] Nausea Only and Other (See Comments)    Lightheadness Dizziness   Penicillins Swelling and Other (See Comments)    Childhood rxn--MD stated he almost died Has patient had a PCN reaction causing immediate rash, facial/tongue/throat swelling, SOB or lightheadedness  with hypotension:Yes Has patient had a PCN reaction causing severe rash involving mucus membranes or skin necrosis:unsure Has patient had a PCN reaction that required hospitalization:unsure Has patient had a PCN reaction occurring within the last 10 years:No If all of the above answers are NO, then may proceed with Cephalosporin use.     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 11/08/2023   Obesity (BMI 30-39.9) 06/03/2023   Alcoholic cirrhosis of liver without ascites (HCC) 03/19/2023   Bilateral plantar fasciitis 01/04/2023   Cervical radiculopathy 01/03/2023   Prostate cancer screening 06/29/2022   Chronic respiratory failure with hypoxia (HCC) 11/24/2021   Encounter for general adult medical examination with abnormal findings 08/20/2021   GAD (generalized anxiety disorder) 07/04/2021   Degenerative disc disease, cervical 04/21/2021   Carotid artery calcification 04/09/2021   Panlobular emphysema (HCC) 03/25/2021   Atherosclerosis of aorta (HCC) 03/25/2021   Body mass index (BMI) 32.0-32.9, adult 01/15/2021   Lumbar spondylosis 01/10/2021   Foraminal stenosis of cervical region 01/10/2021   Chronic bilateral low back pain without sciatica 01/09/2021   Type II diabetes mellitus with manifestations (HCC) 11/29/2020   Primary osteoarthritis of both knees 09/24/2020   Idiopathic chronic gout of foot without tophus 08/08/2020   Yellow jacket sting allergy 08/08/2020   Hyperlipidemia LDL goal <70 07/11/2020   Hypertriglyceridemia 07/08/2020   Chronic combined systolic and diastolic CHF  (congestive heart failure) (HCC) 01/09/2020   Solitary pulmonary nodule 08/15/2019   NICM (nonischemic cardiomyopathy) (HCC) 12/16/2018   Cigarette smoker 01/16/2015   COPD (chronic obstructive pulmonary disease) (HCC) 06/15/2014   Obstructive sleep apnea syndrome 01/05/2013   Asbestos exposure 01/05/2013   Seasonal allergic rhinitis 10/06/2012   GERD (gastroesophageal reflux disease) 06/05/2012   Intrinsic asthma 07/23/2011   HTN (hypertension) 07/23/2011    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   Requires supplemental oxygen      Past Surgical History:  Procedure Laterality Date   BIV UPGRADE N/A 10/07/2021   Procedure: BIV PPM UPGRADE;  Surgeon: Inocencio Soyla Lunger, MD;  Location: [facility]IVE [facility];  Service: Cardiovascular;  Laterality: N/A;   BIV UPGRADE N/A 11/11/2022   Procedure: BIV PPM UPGRADE;  Surgeon: Inocencio Soyla Lunger, MD;  Location: [facility];  Service: Cardiovascular;  Laterality: N/A;   LEFT HEART CATH AND CORONARY ANGIOGRAPHY N/A 04/19/2017   Procedure: Left Heart Cath and Coronary Angiography;  Surgeon: Victory LELON Sharps, MD;  [facility];  Service: Cardiovascular;  Laterality: N/A;   PACEMAKER IMPLANT N/A 04/16/2021   Procedure: PACEMAKER IMPLANT;  Surgeon: Inocencio Soyla Lunger, MD;  Location: [facility] CV [facility];  Service: Cardiovascular;  Laterality: N/A;   RIGHT HEART CATH N/A 03/09/2023   Procedure: RIGHT HEART CATH;  Surgeon: Cherrie Toribio SAUNDERS, MD;  Location: [facility];   Service: Cardiovascular;  Laterality: N/A;   RIGHT/LEFT HEART CATH AND CORONARY ANGIOGRAPHY N/A 08/22/2021   Procedure: RIGHT/LEFT HEART CATH AND CORONARY ANGIOGRAPHY;  Surgeon: Cherrie Toribio SAUNDERS, MD;  Location: [facility];  Service: Cardiovascular;  Laterality: N/A;   TONSILLECTOMY      Social History   Socioeconomic History   Marital status: Single    Spouse name: Not on file   Number  of children: 1   Years of education: 53   Highest education level: 12th grade  Occupational History   Occupation: disabled  Tobacco Use   Smoking status: Former    Current packs/day: 0.00    Average packs/day: 2.0 packs/day for 46.0 years (92.0 ttl pk-yrs)    Types: Cigarettes    Start date: 01/28/1976    Quit date: 01/27/2022    Years since quitting: 2.5    Passive exposure: Past   Smokeless tobacco: Never   Tobacco comments:    Chews on a cigar.  09/16/2023 hfb        Smokes one occasionally per pt. 03/30/24  Vaping Use   Vaping status: Never Used  Substance and Sexual Activity   Alcohol use: Not Currently    Alcohol/week: 28.0 standard drinks of alcohol    Types: 28 Cans of beer per week   Drug use: No   Sexual activity: Yes    Partners: Female    Birth control/protection: None  Other Topics Concern   Not on file  Social History Narrative   Right handed   Tea sometimes and coffee (1/2 caffeine)   Lives alone-2025   Social Drivers of Health   Financial Resource Strain: Medium Risk (06/20/2024)   Overall Financial Resource Strain (CARDIA)    Difficulty of Paying Living Expenses: Somewhat hard  Food Insecurity: Patient Declined (06/20/2024)   Hunger Vital Sign    Worried About Running Out of Food in the Last Year: Patient declined    Ran Out of Food in the Last Year: Patient declined  Transportation Needs: No Transportation Needs (06/20/2024)   PRAPARE - Administrator, Civil Service (Medical): No    Lack of Transportation (Non-Medical): No  Physical Activity:  Inactive (06/20/2024)   Exercise Vital Sign    Days of Exercise per Week: 0 days    Minutes of Exercise per Session: Not on file  Stress: Stress Concern Present (06/20/2024)   [employer] of Occupational Health - Occupational Stress Questionnaire    Feeling of Stress: Very much  Social Connections: Socially Isolated (06/20/2024)   Social Connection and Isolation Panel    Frequency of Communication with Friends and Family: More than three times a week    Frequency of Social Gatherings with Friends and Family: More than three times a week    Attends Religious Services: Patient declined    Database administrator or Organizations: No    Attends Engineer, structural: Not on file    Marital Status: Divorced  Intimate Partner Violence: Not At Risk (06/02/2023)   Humiliation, Afraid, Rape, and Kick questionnaire    Fear of Current or Ex-Partner: No    Emotionally Abused: No    Physically Abused: No    Sexually Abused: No    Family History  Problem Relation Age of Onset   Lung cancer Father    High blood pressure Mother    Diabetes Maternal Grandmother    Colon polyps Neg Hx    Esophageal cancer Neg Hx    Pancreatic cancer Neg Hx    Stomach cancer Neg Hx      Review of Systems  Constitutional: Negative.  Negative for chills and fever.  HENT: Negative.  Negative for congestion and sore throat.   Respiratory: Negative.  Negative for cough and shortness of breath.   Cardiovascular: Negative.  Negative for chest pain and palpitations.  Gastrointestinal:  Negative for abdominal pain, diarrhea, nausea and vomiting.  Genitourinary:  Negative.  Negative for dysuria and hematuria.  Neurological: Negative.  Negative for dizziness and headaches.  All other systems reviewed and are negative.   Vitals:   08/24/24 1607  BP: 118/78  Pulse: 89  Temp: 97.8 F (36.6 C)  SpO2: 92%    Physical Exam Vitals reviewed.  Constitutional:      Appearance: Normal appearance.  HENT:      Head: Normocephalic.  Eyes:     Extraocular Movements: Extraocular movements intact.  Cardiovascular:     Rate and Rhythm: Normal rate.  Pulmonary:     Effort: Pulmonary effort is normal.  Skin:    General: Skin is warm and dry.     Comments: Right big toe: Positive swelling and erythema compatible with cellulitis  Neurological:     Mental Status: He is alert and oriented to person, place, and time.  Psychiatric:        Mood and Affect: Mood normal.        Behavior: Behavior normal.      ASSESSMENT & PLAN: Problem List Items Addressed This Visit       Other   Cellulitis of great toe of right foot - Primary   Secondary to recent foot injury Insect bite now with skin infection Multiple allergies to different antibiotics States he can take doxycycline  Recommend to start doxycycline  100 mg twice a day for at least 7 days Advised to follow-up with PCP next week Advised to contact the office if no better or worse during the next several days      Relevant Medications   doxycycline  (VIBRA -TABS) 100 MG tablet   Patient Instructions  Cellulitis, Adult  Cellulitis is a skin infection. The infected area is often warm, red, swollen, and sore. It occurs most often on the legs, feet, and toes, but can happen on any part of the body. This condition can be life-threatening without treatment. It is very important to get treated right away. What are the causes? This condition is caused by bacteria. The bacteria enter through a break in the skin, such as: A cut. A burn. A bug bite. An animal bite. An open sore. A crack. What increases the risk? Having a weak body's defense system (immune system). Being older than 66 years old. Having a blood sugar problem (diabetes). Having a long-term liver disease (cirrhosis) or kidney disease. Being very overweight (obese). Having a skin problem, such as: An itchy rash. A rash caused by a fungus. A rash with blisters. Slow movement of  blood in the veins (venous stasis). Fluid buildup below the skin (edema). This condition is more likely to occur in people who: Have open cuts, burns, bites, or scrapes on the skin. Have been treated with high-energy rays (radiation). Use IV drugs. What are the signs or symptoms? Skin that: Looks red or purple, or slightly darker than your usual skin color. Has streaks. Has spots. Is swollen. Is sore or painful when you touch it. Is warm. A fever. Chills. Blisters. Tiredness (fatigue). How is this treated? Medicines to treat infections or allergies. Rest. Placing cold or warm cloths on the skin. Staying in the hospital, if the condition is very bad. You may need medicines through an IV. Follow these instructions at home: Medicines Take over-the-counter and prescription medicines only as told by your doctor. If you were prescribed antibiotics, take them as told by your doctor. Do not stop using them even if you start to feel better. General instructions Drink enough fluid to keep  your pee (urine) pale yellow. Do not touch or rub the infected area. Raise (elevate) the infected area above the level of your heart while you are sitting or lying down. Return to your normal activities when your doctor says that it is safe. Place cold or warm cloths on the area as told by your doctor. Keep all follow-up visits. Your doctor will need to make sure that a more serious infection is not developing. Contact a doctor if: You have a fever. You do not start to get better after 1-2 days of treatment. Your bone or joint under the infected area starts to hurt after the skin has healed. Your infection comes back in the same area or another area. Signs of this may include: You have a swollen bump in the area. Your red area gets larger, turns dark in color, or hurts more. You have more fluid coming from the wound. Pus or a bad smell develops in your infected area. You have more pain. You feel  sick and have muscle aches and weakness. You develop vomiting or watery poop that will not go away. Get help right away if: You see red streaks coming from the area. You notice the skin turns purple or black and falls off. These symptoms may be an emergency. Get help right away. Call 911. Do not wait to see if the symptoms will go away. Do not drive yourself to the hospital. This information is not intended to replace advice given to you by your health care provider. Make sure you discuss any questions you have with your health care provider. Document Revised: 08/04/2022 Document Reviewed: 08/04/2022 Elsevier Patient Education  2024 Elsevier Inc.    Emil Schaumann, MD [facility]

## 2024-08-24 NOTE — Assessment & Plan Note (Addendum)
 Secondary to recent foot injury Insect bite now with skin infection Also has history of gout but erythema and swelling are not at the joint but above the joint on the toe Multiple allergies to different antibiotics States he can take doxycycline  Recommend to start doxycycline  100 mg twice a day for at least 7 days Advised to follow-up with PCP next week Advised to contact the office if no better or worse during the next several days

## 2024-08-24 NOTE — Patient Instructions (Signed)
 Cellulitis, Adult    Cellulitis is a skin infection. The infected area is often warm, red, swollen, and sore. It occurs most often on the legs, feet, and toes, but can happen on any part of the body.  This condition can be life-threatening without treatment. It is very important to get treated right away.  What are the causes?  This condition is caused by bacteria. The bacteria enter through a break in the skin, such as:  A cut.  A burn.  A bug bite.  An animal bite.  An open sore.  A crack.  What increases the risk?  Having a weak body's defense system (immune system).  Being older than 66 years old.  Having a blood sugar problem (diabetes).  Having a long-term liver disease (cirrhosis) or kidney disease.  Being very overweight (obese).  Having a skin problem, such as:  An itchy rash.  A rash caused by a fungus.  A rash with blisters.  Slow movement of blood in the veins (venous stasis).  Fluid buildup below the skin (edema).  This condition is more likely to occur in people who:  Have open cuts, burns, bites, or scrapes on the skin.  Have been treated with high-energy rays (radiation).  Use IV drugs.  What are the signs or symptoms?  Skin that:  Looks red or purple, or slightly darker than your usual skin color.  Has streaks.  Has spots.  Is swollen.  Is sore or painful when you touch it.  Is warm.  A fever.  Chills.  Blisters.  Tiredness (fatigue).  How is this treated?  Medicines to treat infections or allergies.  Rest.  Placing cold or warm cloths on the skin.  Staying in the hospital, if the condition is very bad. You may need medicines through an IV.  Follow these instructions at home:  Medicines  Take over-the-counter and prescription medicines only as told by your doctor.  If you were prescribed antibiotics, take them as told by your doctor. Do not stop using them even if you start to feel better.  General instructions  Drink enough fluid to keep your pee (urine) pale yellow.  Do not touch or rub the  infected area.  Raise (elevate) the infected area above the level of your heart while you are sitting or lying down.  Return to your normal activities when your doctor says that it is safe.  Place cold or warm cloths on the area as told by your doctor.  Keep all follow-up visits. Your doctor will need to make sure that a more serious infection is not developing.  Contact a doctor if:  You have a fever.  You do not start to get better after 1-2 days of treatment.  Your bone or joint under the infected area starts to hurt after the skin has healed.  Your infection comes back in the same area or another area. Signs of this may include:  You have a swollen bump in the area.  Your red area gets larger, turns dark in color, or hurts more.  You have more fluid coming from the wound.  Pus or a bad smell develops in your infected area.  You have more pain.  You feel sick and have muscle aches and weakness.  You develop vomiting or watery poop that will not go away.  Get help right away if:  You see red streaks coming from the area.  You notice the skin turns purple or black and falls  off.  These symptoms may be an emergency. Get help right away. Call 911.  Do not wait to see if the symptoms will go away.  Do not drive yourself to the hospital.  This information is not intended to replace advice given to you by your health care provider. Make sure you discuss any questions you have with your health care provider.  Document Revised: 08/04/2022 Document Reviewed: 08/04/2022  Elsevier Patient Education  2024 [employer].

## 2024-08-27 DIAGNOSIS — J9610 Chronic respiratory failure, unspecified whether with hypoxia or hypercapnia: Secondary | ICD-10-CM | POA: Diagnosis not present

## 2024-08-28 ENCOUNTER — Ambulatory Visit

## 2024-09-02 ENCOUNTER — Other Ambulatory Visit: Payer: Self-pay

## 2024-09-05 NOTE — Progress Notes (Signed)
 Remote PPM Transmission

## 2024-09-06 ENCOUNTER — Ambulatory Visit (HOSPITAL_COMMUNITY)
Admission: RE | Admit: 2024-09-06 | Discharge: 2024-09-06 | Disposition: A | Discharge status: Home / Self Care | Source: Ambulatory Visit | Attending: Internal Medicine | Admitting: Internal Medicine

## 2024-09-06 VITALS — BP 128/80 | HR 104 | Wt 230.8 lb

## 2024-09-06 DIAGNOSIS — Z950 Presence of cardiac pacemaker: Secondary | ICD-10-CM

## 2024-09-06 DIAGNOSIS — I5032 Chronic diastolic (congestive) heart failure: Secondary | ICD-10-CM | POA: Diagnosis not present

## 2024-09-06 DIAGNOSIS — F1090 Alcohol use, unspecified, uncomplicated: Secondary | ICD-10-CM | POA: Diagnosis not present

## 2024-09-06 DIAGNOSIS — I110 Hypertensive heart disease with heart failure: Secondary | ICD-10-CM | POA: Insufficient documentation

## 2024-09-06 DIAGNOSIS — Z6833 Body mass index (BMI) 33.0-33.9, adult: Secondary | ICD-10-CM | POA: Insufficient documentation

## 2024-09-06 DIAGNOSIS — E669 Obesity, unspecified: Secondary | ICD-10-CM | POA: Insufficient documentation

## 2024-09-06 DIAGNOSIS — F17210 Nicotine dependence, cigarettes, uncomplicated: Secondary | ICD-10-CM | POA: Insufficient documentation

## 2024-09-06 DIAGNOSIS — Z95810 Presence of automatic (implantable) cardiac defibrillator: Secondary | ICD-10-CM | POA: Insufficient documentation

## 2024-09-06 DIAGNOSIS — G4733 Obstructive sleep apnea (adult) (pediatric): Secondary | ICD-10-CM | POA: Insufficient documentation

## 2024-09-06 DIAGNOSIS — J449 Chronic obstructive pulmonary disease, unspecified: Secondary | ICD-10-CM | POA: Insufficient documentation

## 2024-09-06 DIAGNOSIS — K458 Other specified abdominal hernia without obstruction or gangrene: Secondary | ICD-10-CM | POA: Diagnosis not present

## 2024-09-06 DIAGNOSIS — Z79899 Other long term (current) drug therapy: Secondary | ICD-10-CM | POA: Diagnosis not present

## 2024-09-06 DIAGNOSIS — I5022 Chronic systolic (congestive) heart failure: Secondary | ICD-10-CM | POA: Insufficient documentation

## 2024-09-06 DIAGNOSIS — I428 Other cardiomyopathies: Secondary | ICD-10-CM | POA: Insufficient documentation

## 2024-09-06 NOTE — Patient Instructions (Addendum)
 Good to see you today!  No medication  changes were made  Your physician recommends that you schedule a follow-up appointment in: as scheduled  If you have any questions or concerns before your next appointment please send us  a message through [place] or call our office at [phone].    TO LEAVE A MESSAGE FOR THE NURSE SELECT OPTION 2, PLEASE LEAVE A MESSAGE INCLUDING: YOUR NAME DATE OF BIRTH CALL BACK NUMBER REASON FOR CALL**this is important as we prioritize the call backs  YOU WILL RECEIVE A CALL BACK THE SAME DAY AS LONG AS YOU CALL BEFORE 4:00 PM  At the [facility], you and your health needs are our priority. As part of our continuing mission to provide you with exceptional heart care, we have created designated Provider Care Teams. These Care Teams include your primary Cardiologist (physician) and Advanced Practice Providers (APPs- Physician Assistants and Nurse Practitioners) who all work together to provide you with the care you need, when you need it.   You may see any of the following providers on your designated Care Team at your next follow up: Dr Jules Oar Dr Peder Bourdon Dr. Alwin Baars Dr. Arta Lark Amy Marijane Shoulders, NP Ruddy Corral, [place] [facility] [place], [place] Dennise Fitz, NP [place] Lee, NP Shawnee Dellen, NP Luster Salters, PharmD Bevely Brush, PharmD   Please be sure to bring in all your medications bottles to every appointment.    Thank you for choosing [facility]

## 2024-09-06 NOTE — Progress Notes (Signed)
 ADVANCED HF CLINIC NOTE  PCP: Joshua Debby CROME, MD Pulmonology: Dr. Shelah HF Cardiologist: Dr. Cherrie   HPI: Robert Church is a 68 y.o. male with h/o obesity, HTN, COPD with ongoing tobacco use and chronic systolic heart failure.    Cath 03/2017 - normal coronaries with EF 40% and global HK.  He saw Dr. Court on 07/01/2018 because of recurrent chest pain.  Myoview  stress test performed 07/21/2018 showed EF 38% inferior scar with septal ischemia.  Echo 06/2018  EF of 30 to 35%.  He was referred for cardiac CT.   Admitted 1/22 for acute on chronic hypoxic respiratory failure secondary to acute COPD and CHF w/ volume overload. He had massive diuresis w/ IV Lasix , diuresed down from admit wt of 265 lb to 210 lb. Echo 12/21, EF 40-45% (unchanged). RV mildly reduced, RVSP 49.    In 4/22, had near syncope, Zio showed intermittent CHB. Repeat echo EF 30-35%  Had St Jude dual chamber PM 04/16/21. Had some PMT and device adjusted by EP .   Underwent R/L cath in 9/22 for persistent SOB. No CAD. EF 35% Mildly elevated filling pressures with normal output.   Subsequently underwent CRT-D upgrade attempt on 10/07/21 by Dr. Inocencio due to high-degree of RV pacing. 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   S/p Barostim 2/23  S/p BiV upgrade 11/11/22. C/b LYE DVT and started on Eliquis .   S/p rib/costochondral fracture leading to chest wall hernia with partial herniation of the lateral aspect of the inferior lingula and left lower lobe. Seen by TCTS. Not surgical candidate.   Follow up 6/25, felt poorly and volume up, mostly WC-bound. Echo 6/25 EF 20-25%, mild LVH, G2DD, normal RV.  -Echo 9/25/ EF 20% RV ok   Today he returns for HF follow up. Continues to feel poorly. Says he lies in the bed all day long. SOB with any activity also having severe arthritis pain. Lives alone. Gets groceries through [employer]. Will go to the bar at occasion at night. Still smoking a few cigs per  day.   ICD interrogation: No VT/AF. Volume ok. Activity level < 1hr/day Personally reviewed   Cardiac studies:  - RHC 3/24 RA = 7 RV = 37/11 PA = 39/20 (28) PCW = 13 Fick cardiac output/index = 5.2/2.3 PVR = 2.9 WU FA sat = 91% PA sat = 63%  Review of systems complete and found to be negative unless listed in HPI.    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   Requires supplemental oxygen     Current Outpatient Medications  Medication Sig Dispense Refill   albuterol  (PROVENTIL ) (2.5 MG/3ML) 0.083% nebulizer solution TAKE 3 MLS BY NEBULIZATION EVERY 4 HOURS AS NEEDED FOR WHEEZING OR SHORTNESS OF BREATH 150 mL 11   allopurinol  (ZYLOPRIM ) 300 MG tablet Take 1 tablet (300 mg total) by mouth daily. 90 tablet  0   ALPRAZolam  (XANAX ) 0.5 MG tablet Take 1 tablet (0.5 mg total) by mouth 3 (three) times daily as needed for anxiety. 270 tablet 0   azelastine  (ASTELIN ) 0.1 % nasal spray Place 2 sprays into both nostrils 2 (two) times daily. Use in each nostril as directed 30 mL 1   B Complex-C-Biotin-D-Zinc-FA (VITAL-D RX PO) Take 1 tablet by mouth daily.     baclofen  (LIORESAL ) 10 MG tablet Take 10 mg by mouth as needed for muscle spasms.     DALIRESP  500 MCG TABS tablet Take 500 mcg by mouth daily.     diclofenac Sodium (VOLTAREN) 1 % GEL Apply 1 Application topically in the morning and at bedtime.     DULERA  100-5 MCG/ACT AERO INHALE 2 PUFFS INTO THE LUNGS TWICE DAILY 13 g 0   EPINEPHrine  0.3 mg/0.3 mL IJ SOAJ injection Inject 0.3 mg  into the muscle as needed for anaphylaxis. 1 each 0   fluticasone  (FLONASE ) 50 MCG/ACT nasal spray SHAKE LIQUID AND USE 2 SPRAYS IN EACH NOSTRIL DAILY 16 g 2   guaiFENesin  (MUCINEX ) 600 MG 12 hr tablet Take 600 mg by mouth as needed.     ipratropium (ATROVENT ) 0.06 % nasal spray Place 2 sprays into both nostrils 4 (four) times daily as needed for rhinitis. 45 mL 3   ipratropium-albuterol  (DUONEB) 0.5-2.5 (3) MG/3ML SOLN Take 3 mLs by nebulization every 4 (four) hours as needed. 360 mL 5   loratadine (CLARITIN) 10 MG tablet Take 10 mg by mouth every other day. As needed     methocarbamol  (ROBAXIN ) 500 MG tablet Take 1 tablet (500 mg total) by mouth every 8 (eight) hours as needed for muscle spasms. 30 tablet 1   metolazone  (ZAROXOLYN ) 2.5 MG tablet Take 1 tablet (2.5 mg total) by mouth 2 (two) times a week. Take 2.5 mg on Monday/Friday 8 tablet 11   montelukast  (SINGULAIR ) 10 MG tablet TAKE 1 TABLET BY MOUTH EVERY DAY AT BEDTIME 30 tablet 5   nicotine  (NICODERM CQ ) 14 mg/24hr patch Place 1 patch (14 mg total) onto the skin daily. 28 patch 1   omeprazole  (PRILOSEC) 20 MG capsule TAKE 1 CAPSULE(20 MG) BY MOUTH TWICE DAILY BEFORE A MEAL FOR STOMACH ACID OR REFLUX 180 capsule 0   ondansetron  (ZOFRAN -ODT) 4 MG disintegrating tablet Take 1 tablet (4 mg total) by mouth every 8 (eight) hours as needed for nausea or vomiting. 20 tablet 0   oxyCODONE  (OXY IR/ROXICODONE ) 5 MG immediate release tablet Take 1 tablet (5 mg total) by mouth every 8 (eight) hours as needed for severe pain (pain score 7-10). 90 tablet 0   potassium chloride  SA (KLOR-CON  M) 20 MEQ tablet Take 40 mEq by mouth daily.     predniSONE  (DELTASONE ) 10 MG tablet Take 1 tablet (10 mg total) by mouth daily with breakfast. 90 tablet 1   SPIRIVA  RESPIMAT 2.5 MCG/ACT AERS INHALE 2 PUFFS BY MOUTH DAILY 60 each 2   torsemide  (DEMADEX ) 20 MG tablet Take 2 tablets (40 mg total) by mouth daily. 90 tablet 11   VENTOLIN  HFA 108 (90 Base) MCG/ACT inhaler  INHALE 2 PUFFS INTO THE LUNGS EVERY 6 HOURS AS NEEDED FOR WHEEZING 18 g 4   vitamin B-12 (CYANOCOBALAMIN ) 1000 MCG tablet Take 1,000 mcg by mouth daily.     VITAMIN D , CHOLECALCIFEROL, PO Take 1 tablet by mouth daily.     vitamin E 180 MG (400 UNITS) capsule Take 400 Units by mouth daily.  Ensifentrine  (OHTUVAYRE ) 3 MG/2.5ML SUSP Inhale 3 mg into the lungs 2 (two) times daily. (Patient not taking: Reported on 09/06/2024) 150 mL 5   losartan  (COZAAR ) 25 MG tablet Take 0.5 tablets (12.5 mg total) by mouth at bedtime. (Patient not taking: Reported on 09/06/2024) 45 tablet 3   No current facility-administered medications for this encounter.   Allergies  Allergen Reactions   Bee Venom Shortness Of Breath and Swelling   Cefdinir  Other (See Comments)    Patient states he couldn't really breath    Spironolactone  Anaphylaxis   Bactrim  [Sulfamethoxazole -Trimethoprim ] Other (See Comments)    Mouth burning   Daliresp  [Roflumilast ] Other (See Comments)    Dizzy, headache, leg pain per patient. 02/25/22. Tolerates in low doses    Jardiance  [Empagliflozin ] Nausea Only and Other (See Comments)    Lightheadness Dizziness   Penicillins Swelling and Other (See Comments)    Childhood rxn--MD stated he almost died Has patient had a PCN reaction causing immediate rash, facial/tongue/throat swelling, SOB or lightheadedness with hypotension:Yes Has patient had a PCN reaction causing severe rash involving mucus membranes or skin necrosis:unsure Has patient had a PCN reaction that required hospitalization:unsure Has patient had a PCN reaction occurring within the last 10 years:No If all of the above answers are NO, then may proceed with Cephalosporin use.     Clindamycin Other (See Comments)    Tongue swelling   Doxycycline  Nausea And Vomiting    Severe stomach upset per patient   E-Mycin [Erythromycin Base] Swelling   Rosuvastatin Other (See Comments)    Myalgias (intolerance)   Social History    Socioeconomic History   Marital status: Single    Spouse name: Not on file   Number of children: 1   Years of education: 64   Highest education level: 12th grade  Occupational History   Occupation: disabled  Tobacco Use   Smoking status: Former    Current packs/day: 0.00    Average packs/day: 2.0 packs/day for 46.0 years (92.0 ttl pk-yrs)    Types: Cigarettes    Start date: 01/28/1976    Quit date: 01/27/2022    Years since quitting: 2.6    Passive exposure: Past   Smokeless tobacco: Never   Tobacco comments:    Chews on a cigar.  09/16/2023 hfb        Smokes one occasionally per pt. 03/30/24  Vaping Use   Vaping status: Never Used  Substance and Sexual Activity   Alcohol use: Not Currently    Alcohol/week: 28.0 standard drinks of alcohol    Types: 28 Cans of beer per week   Drug use: No   Sexual activity: Yes    Partners: Female    Birth control/protection: None  Other Topics Concern   Not on file  Social History Narrative   Right handed   Tea sometimes and coffee (1/2 caffeine)   Lives alone-2025   Social Drivers of Health   Financial Resource Strain: Medium Risk (06/20/2024)   Overall Financial Resource Strain (CARDIA)    Difficulty of Paying Living Expenses: Somewhat hard  Food Insecurity: Patient Declined (06/20/2024)   Hunger Vital Sign    Worried About Running Out of Food in the Last Year: Patient declined    Ran Out of Food in the Last Year: Patient declined  Transportation Needs: No Transportation Needs (06/20/2024)   PRAPARE - Administrator, Civil Service (Medical): No    Lack of Transportation (Non-Medical): No  Physical Activity: Inactive (06/20/2024)  Exercise Vital Sign    Days of Exercise per Week: 0 days    Minutes of Exercise per Session: Not on file  Stress: Stress Concern Present (06/20/2024)   [employer] of Occupational Health - Occupational Stress Questionnaire    Feeling of Stress: Very much  Social Connections: Socially  Isolated (06/20/2024)   Social Connection and Isolation Panel    Frequency of Communication with Friends and Family: More than three times a week    Frequency of Social Gatherings with Friends and Family: More than three times a week    Attends Religious Services: Patient declined    Database administrator or Organizations: No    Attends Engineer, structural: Not on file    Marital Status: Divorced  Intimate Partner Violence: Not At Risk (06/02/2023)   Humiliation, Afraid, Rape, and Kick questionnaire    Fear of Current or Ex-Partner: No    Emotionally Abused: No    Physically Abused: No    Sexually Abused: No   Family History  Problem Relation Age of Onset   Lung cancer Father    High blood pressure Mother    Diabetes Maternal Grandmother    Colon polyps Neg Hx    Esophageal cancer Neg Hx    Pancreatic cancer Neg Hx    Stomach cancer Neg Hx    Wt Readings from Last 3 Encounters:  09/06/24 104.7 kg (230 lb 12.8 oz)  08/24/24 105.4 kg (232 lb 6.4 oz)  08/02/24 106.1 kg (234 lb)   BP 128/80   Pulse (!) 104   Wt 104.7 kg (230 lb 12.8 oz)   SpO2 91% Comment: 2 l n/c  BMI 33.12 kg/m   PHYSICAL EXAM: General:  NAD. No resp difficulty, arrived in [facility] HEENT: normal Neck: supple. no JVD. Carotids 2+ bilat; no bruits. No lymphadenopathy or thryomegaly appreciated. Cor: PMI nondisplaced. Regular rate & rhythm. No rubs, gallops or murmurs. Lungs: decreased throughout Abdomen: soft, nontender, nondistended. No hepatosplenomegaly. No bruits or masses. Good bowel sounds. Extremities: no cyanosis, clubbing, rash, tr-1+ edema Neuro: alert & orientedx3, cranial nerves grossly intact. moves all 4 extremities w/o difficulty. Affect pleasant   ICD interrogation (personally reviewed) Volume ok. BiV pacing > 99% <1% AF No VT . Personally reviewed   ASSESSMENT & PLAN:  1. Chronic systolic HF due to NICM  - Suspect NICM due to HTN and inadequately treated OSA (He now reports  improved nightly compliance w/ CPAP). - Echo 1/22: EF 40-45%. RV mildly reduced. RVSP 49 - Echo 07/31/21: EF 30-35 % and RV moderately reduced.  - R/LHC 9/22 No CAD EF 35%, Mildly elevated filling pressures with normal output - BiV upgrade 11/11/22. C/b LYE DVT and started on Eliquis .  - s/p Barostim. - Echo 1/24: EF < 20% - RHC 3/24:  RA 7 PA 39/20 (28) PCW 13 Fick 5.2/2.3 - Echo 6/24: EF 35-40% RV low normal  (EF improving with CRT) - Echo 6/25: EF 20-25%, mild LVH, G2DD, normal RV. - Echo 9/25/ EF 20% RV ok  - Continues to deteriorate NYHA IIIB--IV, HF symptoms compounded by COPD, obesity, chronic pain and orthopedic issues . GDMT limited by multiple side effects. - Volume OK on exan and ICD - Continue torsemide  40 mg daily + metolazone  2.5 mg 2x/week (M + F) - Intolerant losartan  due to dizziness  - No spiro (h/o Anaphylaxis).  - Intolerant to Jardiance  (positional dizziness and nausea).  - Intolerant bisoprolol  due to fatigue, coreg  due to dizziness,  and toprol  due to dry mouth.  - Focus has been on tight volume management and CRT - Long discussion regarding potential Palliative Care/Hospice involvement but he does not feel he is ready for that yet.  2.  HTN - BP stable   3. OSA - Compliant w/BIPAP  4. Chest wall hernia - has seen TCTS.  - Not surgical candidate  5. Tobacco and ETOH use - Still smoking - we discussed potential cessation  I spent a total of 49 minutes today: 1) reviewing the patient's medical records including previous charts, labs and recent notes from other providers; 2) examining the patient and counseling them on their medical issues/explaining the plan of care; 3) adjusting meds as needed and 4) ordering lab work or other needed tests.    Robert Fuel, MD 3:33 PM

## 2024-09-07 ENCOUNTER — Encounter (HOSPITAL_COMMUNITY): Admitting: Internal Medicine

## 2024-09-24 ENCOUNTER — Other Ambulatory Visit: Payer: Self-pay | Admitting: Internal Medicine

## 2024-09-24 DIAGNOSIS — M1A0221 Idiopathic chronic gout, left elbow, with tophus (tophi): Secondary | ICD-10-CM

## 2024-09-24 DIAGNOSIS — M1A9XX1 Chronic gout, unspecified, with tophus (tophi): Secondary | ICD-10-CM

## 2024-09-25 ENCOUNTER — Other Ambulatory Visit: Payer: Self-pay | Admitting: Internal Medicine

## 2024-09-25 DIAGNOSIS — F411 Generalized anxiety disorder: Secondary | ICD-10-CM

## 2024-09-26 DIAGNOSIS — J9610 Chronic respiratory failure, unspecified whether with hypoxia or hypercapnia: Secondary | ICD-10-CM | POA: Diagnosis not present

## 2024-09-26 DIAGNOSIS — J449 Chronic obstructive pulmonary disease, unspecified: Secondary | ICD-10-CM | POA: Diagnosis not present

## 2024-09-27 ENCOUNTER — Other Ambulatory Visit: Payer: Self-pay

## 2024-09-27 ENCOUNTER — Telehealth: Payer: Self-pay

## 2024-09-27 ENCOUNTER — Other Ambulatory Visit: Payer: Self-pay | Admitting: Emergency Medicine

## 2024-09-27 NOTE — Telephone Encounter (Signed)
 Copied from CRM [id number]. Topic: Clinical - Prescription Issue >> Sep 27, 2024 10:48 AM Robert Church wrote: Reason for CRM:  Patient called to check on the status of refill for ALPRAZolam  (XANAX ) 0.5 MG tablet . Patient advised that medication refill request was received and is still currently pending Dr. Joshua approval. Patient would like a call back if refill is denied.    CB#[phone] (M)

## 2024-09-27 NOTE — Telephone Encounter (Signed)
 Medication has been refilled.

## 2024-10-01 NOTE — Addendum Note (Signed)
 Encounter addended by: Asya Derryberry R, MD on: 10/01/2024 5:29 PM  Actions taken: Clinical Note Signed, Level of Service modified, Visit diagnoses modified, Charge Capture section accepted

## 2024-10-03 NOTE — Telephone Encounter (Signed)
 He is on maintenance prednisone . Is this refill supposed to be for his usual daily dosing, for a taper? If he is requesting a taper then we need to find out what sx he is having.

## 2024-10-05 ENCOUNTER — Ambulatory Visit: Admitting: Emergency Medicine

## 2024-10-05 ENCOUNTER — Encounter: Payer: Self-pay | Admitting: Emergency Medicine

## 2024-10-05 ENCOUNTER — Telehealth: Payer: Self-pay

## 2024-10-05 ENCOUNTER — Telehealth (INDEPENDENT_AMBULATORY_CARE_PROVIDER_SITE_OTHER): Admitting: Emergency Medicine

## 2024-10-05 VITALS — Ht 70.0 in | Wt 231.0 lb

## 2024-10-05 DIAGNOSIS — J441 Chronic obstructive pulmonary disease with (acute) exacerbation: Secondary | ICD-10-CM

## 2024-10-05 DIAGNOSIS — J9611 Chronic respiratory failure with hypoxia: Secondary | ICD-10-CM | POA: Diagnosis not present

## 2024-10-05 DIAGNOSIS — J302 Other seasonal allergic rhinitis: Secondary | ICD-10-CM | POA: Diagnosis not present

## 2024-10-05 DIAGNOSIS — G4733 Obstructive sleep apnea (adult) (pediatric): Secondary | ICD-10-CM | POA: Diagnosis not present

## 2024-10-05 NOTE — Assessment & Plan Note (Signed)
 Continue his trilogy ventilator as he has been using it

## 2024-10-05 NOTE — Telephone Encounter (Signed)
 Copied from CRM [id number]. Topic: Clinical - Prescription Issue >> Oct 05, 2024  4:23 PM Rozanna MATSU wrote: Reason for CRM: pt return call to [facility] caller lost while calling CAL. Attemtping to call him back >> Oct 05, 2024  4:29 PM Lavanda D wrote: Pt returning call to speak with Marcus, please follow-up with patient when available. Any time after 9am please. >> Oct 05, 2024  4:24 PM Rozanna MATSU wrote: Attemtped to call pt back no answer   Called and spoke with Robert Church. I am going to call pt back 10/17 as the information he is concerned about is at is moms house.  Will call back later.

## 2024-10-05 NOTE — Assessment & Plan Note (Signed)
 Multifactorial due to his COPD, restrictive disease from obesity and is lung herniation, OSA/OHS.  We will continue his trilogy ventilator while sleeping and napping.  Continue to do surveillance for any evidence of daytime hypoxemia.

## 2024-10-05 NOTE — Assessment & Plan Note (Signed)
 Continue current regimen

## 2024-10-05 NOTE — Progress Notes (Signed)
 Virtual Visit via Video Note  I connected with Robert Church on 10/05/24 at  3:15 PM EDT by a video enabled telemedicine application and verified that I am speaking with the correct person using two identifiers.  Location: Patient: Home Provider: Office   I discussed the limitations of evaluation and management by telemedicine and the availability of in person appointments. The patient expressed understanding and agreed to proceed.  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  He has been unable to tolerate Ohtuvayre  (but he was on daliresp  at the time), is currently managed on prednisone  10 mg, Dulera , Spiriva , namebrand Daliresp  (he has trouble with the generic).  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   Observations/Objective: Since I have seen him he has been treated with prednisone  for an acute flare. Current dose of Pred is 10mg .  Has good days and bad - sometimes can do housework. He has pain due to the lung herniation  He is wearing O2 at night and his vent while sleeping and napping. New machine, new vent.  He doesn't get the flu shot, or any vaccines.  He wants to know when his daliresp  PA was done. Also needs paperwork for financial assistance for his Spiriva .  Smoking a few every day.   Assessment and Plan: COPD (chronic obstructive pulmonary disease) (HCC) Very severe disease on maximal therapy.  He continues to smoke.  Have discussed with him the need to stop but he is in the precontemplative phase.  Continue his prednisone  10 mg, Spiriva  and Dulera .  He will stay on either Daliresp  or Ohtuvayre  -he is considering trying to get back to the inhaled medication to  see if he can tolerate.  If he is going to get back on Daliresp  then we will need to fill out paperwork to clarify that he needs to be on the namebrand and not the generic.  This has gone to his insurance in the past.  Discussed with him the recommendation for vaccines but he refuses these.  Chronic respiratory failure with hypoxia (HCC) Multifactorial due to his COPD, restrictive disease from obesity and is lung herniation, OSA/OHS.  We will continue his trilogy ventilator while sleeping and napping.  Continue to do surveillance for any evidence of daytime hypoxemia.  Obstructive sleep apnea syndrome Continue his trilogy ventilator as he has been using it  Seasonal allergic rhinitis Continue current regimen   Follow Up Instructions: Follow-up with APP in 3 months or as needed.   I discussed the assessment and treatment plan with the patient. The patient was provided an opportunity to ask questions and all were answered. The patient agreed with the plan and demonstrated an understanding of the instructions.   The patient was advised to call back or seek an in-person evaluation if the symptoms worsen or if the condition fails to improve as anticipated.  I provided 32 minutes of non-face-to-face time during this encounter.   Lamar GORMAN Chris, MD

## 2024-10-05 NOTE — Telephone Encounter (Signed)
 Pharmacy can you please advise PA for Daliresp 

## 2024-10-05 NOTE — Assessment & Plan Note (Addendum)
 Very severe disease on maximal therapy.  He continues to smoke.  Have discussed with him the need to stop but he is in the precontemplative phase.  Continue his prednisone  10 mg, Spiriva  and Dulera .  He will stay on either Daliresp  or Ohtuvayre  -he is considering trying to get back to the inhaled medication to see if he can tolerate.  If he is going to get back on Daliresp  then we will need to fill out paperwork to clarify that he needs to be on the namebrand and not the generic.  This has gone to his insurance in the past.  Discussed with him the recommendation for vaccines but he refuses these.

## 2024-10-06 ENCOUNTER — Telehealth: Payer: Self-pay | Admitting: *Deleted

## 2024-10-06 ENCOUNTER — Other Ambulatory Visit (HOSPITAL_COMMUNITY): Payer: Self-pay

## 2024-10-06 ENCOUNTER — Encounter: Payer: Self-pay | Admitting: *Deleted

## 2024-10-06 DIAGNOSIS — Z006 Encounter for examination for normal comparison and control in clinical research program: Secondary | ICD-10-CM

## 2024-10-06 NOTE — Research (Signed)
 Study Termination -End of study visit .  Exit / Termination Date: 10-06-2024  All study visits complete sent letter to patient.    [place] Zehava Turski, Research Coordinator 10/06/2024

## 2024-10-06 NOTE — Telephone Encounter (Signed)
 Copied from CRM [id number]. Topic: Clinical - Medication Question >> Oct 06, 2024  9:32 AM Joesph PARAS wrote: Reason for CRM: Patient returning call to [facility]. Please return call to patient when able.  duplicate

## 2024-10-06 NOTE — Telephone Encounter (Signed)
 Per message with Juliana,  It looks like his current approval will end on 11/19/24 (so not sure if he's gotten a reminder letter or something in the mail). If this is what he's talking about then most insurances will not let us  submit a renewal for coverage more than a month out from the expiration date.  So as of right now-it's going through, so a PA is not needed. A PA would create a formulary exception for a certain amount of time. Is he complaining about the price? Again he should be fine until AT LEAST next month due to the last fill dates.    Will try to call pt and explain 10/20

## 2024-10-06 NOTE — Telephone Encounter (Signed)
ATC x1.  LMTCB. 

## 2024-10-06 NOTE — Telephone Encounter (Signed)
 Spoke with the pt  He states that the pharmacy told him they need exception for the Daliresp   Routing to pharm team to see what is needed- sounds like PA

## 2024-10-06 NOTE — Telephone Encounter (Signed)
 I discussed with him at office visit, 10/05/2024.  The plan is to refill his standard maintenance prednisone  10 mg.  Done.

## 2024-10-09 ENCOUNTER — Other Ambulatory Visit: Payer: Self-pay | Admitting: Emergency Medicine

## 2024-10-09 NOTE — Telephone Encounter (Signed)
 Spoke with the pt.  He states it is not  price issue. Pt will reach out to clinic when time to submit a PA.

## 2024-10-10 ENCOUNTER — Ambulatory Visit: Admitting: Family Medicine

## 2024-10-10 ENCOUNTER — Ambulatory Visit: Payer: Self-pay

## 2024-10-10 VITALS — BP 136/90 | HR 99 | Temp 98.3°F | Ht 70.0 in | Wt 232.6 lb

## 2024-10-10 DIAGNOSIS — I5042 Chronic combined systolic (congestive) and diastolic (congestive) heart failure: Secondary | ICD-10-CM | POA: Diagnosis not present

## 2024-10-10 DIAGNOSIS — L02419 Cutaneous abscess of limb, unspecified: Secondary | ICD-10-CM | POA: Diagnosis not present

## 2024-10-10 DIAGNOSIS — M1A9XX1 Chronic gout, unspecified, with tophus (tophi): Secondary | ICD-10-CM | POA: Diagnosis not present

## 2024-10-10 DIAGNOSIS — M159 Polyosteoarthritis, unspecified: Secondary | ICD-10-CM

## 2024-10-10 DIAGNOSIS — L03031 Cellulitis of right toe: Secondary | ICD-10-CM

## 2024-10-10 NOTE — Progress Notes (Signed)
 Acute Office Visit  Subjective:     Patient ID: Robert Church, male    DOB: [birth year], 68 y.o.   MRN: [id number]  Chief Complaint  Patient presents with   Acute Visit    HPI  Discussed the use of AI scribe software for clinical note transcription with the patient, who gave verbal consent to proceed.  History of Present Illness Robert Church is a 68 year old male who presents with an infected lesion on his big toe.  Infected digital lesions - Lesion developed on big toe approximately 5-6 weeks ago, burst with release of brown fluid, leaving a small hole. - Initial self-treatment with peroxide. - Another lesion appeared near pinky toe about 1 week ago, also burst. - Big toe lesion currently open and draining. - Uses warm compresses and showers to aid drainage. - Has a full prescription of doxycycline  at home but tolerates only 3-4 days due to nausea, which is only partially managed with small pills.  Chronic joint pain - Chronic pain in knees and fingers. - Uses BC powders daily for pain relief, taking two per day with food. - Finds diclofenac gel too strong.  Heart failure symptoms - Heart function at 20%. - Shortness of breath and fluid retention. - Takes diuretics to manage fluid levels, adjusting dose based on daily activities. - Poor sleep, often sleeps in a recliner due to discomfort.     ROS Per HPI      Objective:    BP (!) 136/90 (BP Location: Left Arm, Patient Position: Sitting)   Pulse 99   Temp 98.3 F (36.8 C) (Temporal)   Ht 5' 10 (1.778 m)   Wt 232 lb 9.6 oz (105.5 kg)   SpO2 90%   BMI 33.37 kg/m    Physical Exam Vitals and nursing note reviewed.  Constitutional:      General: He is not in acute distress.    Appearance: He is obese.     Comments: Chronically ill-appearing  HENT:     Head: Normocephalic and atraumatic.     Right Ear: External ear normal.     Left Ear: External ear normal.     Nose: Nose normal.      Mouth/Throat:     Mouth: Mucous membranes are moist.     Pharynx: Oropharynx is clear.  Eyes:     Extraocular Movements: Extraocular movements intact.  Cardiovascular:     Rate and Rhythm: Normal rate and regular rhythm.     Pulses: Normal pulses.  Pulmonary:     Effort: Pulmonary effort is normal. No respiratory distress.     Breath sounds: Wheezing present. No rhonchi or rales.  Musculoskeletal:     Cervical back: Normal range of motion.     Right lower leg: Edema (3+ pitting) present.     Left lower leg: Edema (3+ pitting) present.     Comments: In wheelchair for visit  Lymphadenopathy:     Cervical: No cervical adenopathy.  Skin:    General: Skin is warm and dry.     Comments: Hyperkeratotic skin lesion to the medial aspect of the right great toe, no discharge, no bleeding.  There is surrounding erythema that extends across the next 3 toes and up to the midfoot, tenderness in this area, mild warmth Right axilla with 4 cm diameter area of erythema, warmth, exquisite tenderness that is draining yellow fluid.  No streaking noted  Neurological:     General: No focal deficit present.  Mental Status: He is alert and oriented to person, place, and time.  Psychiatric:        Mood and Affect: Mood normal.        Behavior: Behavior normal.     No results found for any visits on 10/10/24.      Assessment & Plan:   Assessment and Plan Assessment & Plan Cellulitis of right great toe, right axillary abscess  Axillary abscess with drainage. Antibiotic intolerance noted. - Initiated doxycycline  for 7 days, extend to 10-12 days if tolerated. - Apply warm compresses to aid drainage. - Prescribed anti-nausea medication as needed.  Chronic combined systolic and diastolic heart failure Chronic heart failure with 20% ejection fraction, causing dyspnea and fluid retention. Focus on symptom management. - Continued diuretics as tolerated.  Chronic tophaceous gout  with polyarticular  involvement Chronic gout with joint pain and swelling. Cautious NSAID use due to side effects.  Generalized osteoarthritis  with polyarticular involvement Osteoarthritis causing chronic pain and stiffness. Avoided stronger NSAIDs due to side effects. - Continued Biofreeze for pain relief. - Avoided stronger NSAIDs.     No orders of the defined types were placed in this encounter.    No orders of the defined types were placed in this encounter.   Return if symptoms worsen or fail to improve.  Corean LITTIE Ku, FNP

## 2024-10-10 NOTE — Telephone Encounter (Signed)
 FYI Only or Action Required?: FYI only for provider.  Patient was last seen in primary care on 08/24/2024 by Purcell Emil Schanz, MD.  Called Nurse Triage reporting [place].  Symptoms began a couple of weeks ago.  Interventions attempted: OTC medications: BC powder and Ice/heat application.  Symptoms are: gradually worsening.  Triage Disposition: See PCP When Office is Open (Within 3 Days)  Patient/caregiver understands and will follow disposition?: Yes                            Copied from CRM #[id number]. Topic: Clinical - Red Word Triage >> Oct 10, 2024  9:09 AM Robert Church wrote: Red Word that prompted transfer to Nurse Triage: Painful knot under arm, requesting sn appt for today. Reason for Disposition  [1] Very tender to the touch AND [2] no fever  Answer Assessment - Initial Assessment Questions 1. LOCATION: Where is the swollen node located? Is the matching node on the other side of the body also swollen?      Right arm pit 2. SIZE: How big is the node? (e.g., inches or centimeters; or compared to common objects such as pea, bean, marble, golf ball)      About the size of a quarter 3. ONSET: When did the swelling start?      First noticed a couple of weeks ago, intensified over the weekend  4. NECK NODES: Is there a sore throat, runny nose or other symptoms of a cold?      Denies cold/flu symptoms, denies swollen lymph nodes in neck  5. GROIN OR ARMPIT NODES: Is there a sore, scratch, cut or painful red area on that arm or leg?      Denies 6. FEVER: Do you have a fever? If Yes, ask: What is it, how was it measured, and when did it start?      Denies 7. CAUSE: What do you think is causing the swollen lymph nodes?     Unsure 8. OTHER SYMPTOMS: Do you have any other symptoms? (e.g., node is tender to touch, skin redness over node, weight changes)     Redness, irritation, painful to the touch- rates pain an 8 9. PREGNANCY: Is there  any chance you are pregnant? When was your last menstrual period?     N/A  Protocols used: Lymph Nodes - Swollen-A-AH

## 2024-10-14 NOTE — Patient Instructions (Signed)
 May apply out warm moist compresses to your right underarm.  The area is already draining and it should continue to do so.  Doxycycline  twice a day for 7 days.  Please try to continue to take it consistently for 7 days.  Follow-up with us  at the end of the week as needed, sooner if you are noticing any high fever, abdominal pain, nausea, vomiting, diarrhea, other concerning symptoms.

## 2024-10-19 ENCOUNTER — Other Ambulatory Visit: Payer: Self-pay | Admitting: Family Medicine

## 2024-10-19 ENCOUNTER — Other Ambulatory Visit: Payer: Self-pay | Admitting: Emergency Medicine

## 2024-10-19 ENCOUNTER — Other Ambulatory Visit (HOSPITAL_COMMUNITY): Payer: Self-pay | Admitting: Adult Health

## 2024-10-19 DIAGNOSIS — I5042 Chronic combined systolic (congestive) and diastolic (congestive) heart failure: Secondary | ICD-10-CM

## 2024-10-19 DIAGNOSIS — K2100 Gastro-esophageal reflux disease with esophagitis, without bleeding: Secondary | ICD-10-CM

## 2024-10-23 ENCOUNTER — Encounter: Payer: Self-pay | Admitting: Radiology

## 2024-11-06 ENCOUNTER — Other Ambulatory Visit: Payer: Self-pay

## 2024-11-06 ENCOUNTER — Other Ambulatory Visit: Payer: Self-pay | Admitting: Emergency Medicine

## 2024-11-07 ENCOUNTER — Telehealth: Payer: Self-pay | Admitting: Internal Medicine

## 2024-11-07 ENCOUNTER — Other Ambulatory Visit: Payer: Self-pay

## 2024-11-07 MED ORDER — DULERA 100-5 MCG/ACT IN AERO: 2.0000 | INHALATION_SPRAY | Freq: Two times a day (BID) | RESPIRATORY_TRACT | 0 refills | Status: DC

## 2024-11-07 MED ORDER — ROFLUMILAST 500 MCG PO TABS
500.0000 ug | ORAL_TABLET | Freq: Every day | ORAL | 11 refills | Status: AC
Start: 2024-11-07 — End: ?

## 2024-11-07 NOTE — Telephone Encounter (Signed)
 Spoke with pharmacy and cancelled rx for Roflumilast .  NFN

## 2024-11-07 NOTE — Telephone Encounter (Signed)
 Atc x1. I left a detailed message on vm informing pt that a refill was already placed for him this morning for his Dulera . NFN

## 2024-11-07 NOTE — Telephone Encounter (Signed)
 Copied from CRM [id number]. Topic: Clinical - Prescription Issue >> Nov 07, 2024  9:12 AM Nathanel DEL wrote: Reason for CRM: [facility] states they have not received the Rx  DULERA  100-5 MCG/ACT AERO It was sent to pharmacy yesterday am, but confirmed, [facility] has not received.  Can you resend asap?  Pt will be going out later this afternoon.  He says he is handicapped, and difficult for him to get out and around, and he would like to p/u then if possible.  [id number] - [place], [place] - [address] AT [facility] [address]

## 2024-11-07 NOTE — Telephone Encounter (Signed)
 Copied from CRM [id number]. Topic: Clinical - Prescription Issue >> Nov 07, 2024  9:12 AM Nathanel DEL wrote: Reason for CRM: [facility] states they have not received the Rx  DULERA  100-5 MCG/ACT AERO It was sent to pharmacy yesterday am, but confirmed, [facility] has not received.  Can you resend asap?  Pt will be going out later this afternoon.  He says he is handicapped, and difficult for him to get out and around. >> Nov 07, 2024 11:58 AM Benton KIDD wrote: Patient is returning call from [employer] . Patient is saying the pharmacy is saying they dont have the medication please give patient a call back tried calling the cal line but no response . Patient is needing to speak with a nurse concerning this medication  >> Nov 07, 2024 11:01 AM Zane F wrote: Prescription In question: DULERA  100-5 MCG/ACT AERO   Patient is calling in to check on the resubmission of the listed prescription. Patient was previously informed that the prescription was sent over but the patient states the [facility] did not receive the prescription as the e-receipt confirms. Specialist attempted to contact CAL but call was not answered. Patient was informed that no update is listed at this time. Please resubmit prescription for the patient to pick up today. If resubmission of the prescription cannot be done, please call the patient to update him on the prescription status by end of business day.   Preferred Pharmacy: [id number] [place], [place] - [address] AT [facility] OF [facility] [address] [place], [place] [address] Phone: [phone]  Fax: [phone]   Callback Number: [phone]   ATC x1. LDVM stating Dulera  was sent to pharmacy and pharmacy confirmed it at 11:19am. Advised pt to contact pharmacy.  See Mychart encounter 7/11, Generic rx for Daliresp  needs to be cancelled.  I tried calling the pts pharmacy and was left on hold for . Will try to call pharmacy again.

## 2024-11-07 NOTE — Telephone Encounter (Signed)
 Patient returning call he just received from Robert Church cma . This message was in here under primary so that's why I sent this message back through here but will be routing to pulmonary [place] . Patient says something needs to be taking off his profile because the pharmacy is saying they still dont have medication . Please reach back out to patient concerning the issue with his medication

## 2024-11-07 NOTE — Telephone Encounter (Signed)
 error

## 2024-11-07 NOTE — Telephone Encounter (Signed)
 Thank you - I signed the generic script

## 2024-11-07 NOTE — Addendum Note (Signed)
 Addended byBETHA FRIES, Zahki Hoogendoorn A on: 11/07/2024 11:18 AM   Modules accepted: Orders

## 2024-11-07 NOTE — Telephone Encounter (Signed)
 Spoke with the pt on the phone and advised pt dulera  inhaler was sent to the pharmacy and pt is aware pharmacy has received it. In encounter 7/11 an old message for pt requesting generic Daliresp  was opened and an rx was sent. This is incorrect and I am going to call pharmacy and cancel this rx.  Nothing further needed.

## 2024-11-10 ENCOUNTER — Telehealth: Payer: Self-pay

## 2024-11-10 NOTE — Telephone Encounter (Signed)
 Copied from CRM [id number]. Topic: General - Call Back - No Documentation >> Nov 10, 2024 10:15 AM Amy B wrote: Reason for CRM: Patient returned missed call from clinic.  Attempted warm transfer and was told that provider was rooming a patient and he would call the patient.

## 2024-11-10 NOTE — Telephone Encounter (Signed)
 Patient isn't sure what this written order was about. I asked him did he need anything from us  he said no. I advised him to call the office if he needed anything he gave a verbal understanding and disconnected the call.

## 2024-11-10 NOTE — Telephone Encounter (Signed)
Please see other office note. 

## 2024-11-10 NOTE — Telephone Encounter (Signed)
 Unable to reach patient. LMTRC

## 2024-11-10 NOTE — Telephone Encounter (Signed)
 Copied from CRM [id number]. Topic: General - Other >> Nov 09, 2024  3:05 PM Nessti S wrote: Reason for CRM: called because a fax with the detailed written order faxed 04/28/2024. Would like a call back.

## 2024-11-10 NOTE — Telephone Encounter (Signed)
 Received patient assistant forms for Spiriva  through [employer].  Forms have been signed by RB and faxed successfully.

## 2024-11-10 NOTE — Telephone Encounter (Signed)
 I have already spoke with the patient.

## 2024-11-13 ENCOUNTER — Other Ambulatory Visit (HOSPITAL_COMMUNITY): Payer: Self-pay

## 2024-11-13 ENCOUNTER — Other Ambulatory Visit: Payer: Self-pay | Admitting: Emergency Medicine

## 2024-11-13 MED ORDER — DULERA 100-5 MCG/ACT IN AERO
2.0000 | INHALATION_SPRAY | Freq: Two times a day (BID) | RESPIRATORY_TRACT | 0 refills | Status: DC
  Filled 2024-11-13: qty 13, 30d supply, fill #0

## 2024-11-13 MED ORDER — MOMETASONE FURO-FORMOTEROL FUM 200-5 MCG/ACT IN AERO: 2.0000 | INHALATION_SPRAY | Freq: Two times a day (BID) | RESPIRATORY_TRACT | 0 refills | Status: AC

## 2024-11-13 NOTE — Telephone Encounter (Signed)
 Copied from CRM #[id number]. Topic: Clinical - Prescription Issue >> Nov 13, 2024  3:23 PM Devaughn RAMAN wrote: Reason for CRM: Pt calling regarding mometasone -formoterol  (DULERA ) 100-5 MCG/ACT AERO medication. Pt stated he needs a prescription for his medication and it is on backorder at his current pharmacy and he would like to get it from [facility] on [facility]. Pt did not provide additional info for the pharmacy  Rx sent to pharmacy.

## 2024-11-14 ENCOUNTER — Ambulatory Visit: Payer: Medicare Other

## 2024-11-14 DIAGNOSIS — I5022 Chronic systolic (congestive) heart failure: Secondary | ICD-10-CM | POA: Diagnosis not present

## 2024-11-14 NOTE — Telephone Encounter (Signed)
 Called and spoke with the pt and the pt forgot to sign the date on the [employer] application form. I have re-faxed these forms and received confirmation.

## 2024-11-15 LAB — CUP PACEART REMOTE DEVICE CHECK
Battery Remaining Longevity: 66 mo
Battery Remaining Percentage: 69 %
Battery Voltage: 2.98 V
Brady Statistic AP VP Percent: 1.6 %
Brady Statistic AP VS Percent: 1 %
Brady Statistic AS VP Percent: 98 %
Brady Statistic AS VS Percent: 1 %
Brady Statistic RA Percent Paced: 1.5 %
Date Time Interrogation Session: 20251125134335
Implantable Lead Connection Status: 753985
Implantable Lead Connection Status: 753985
Implantable Lead Connection Status: 753985
Implantable Lead Implant Date: 20220427
Implantable Lead Implant Date: 20220427
Implantable Lead Implant Date: 20231122
Implantable Lead Location: 753858
Implantable Lead Location: 753859
Implantable Lead Location: 753860
Implantable Pulse Generator Implant Date: 20231122
Lead Channel Impedance Value: 490 Ohm
Lead Channel Impedance Value: 510 Ohm
Lead Channel Impedance Value: 650 Ohm
Lead Channel Pacing Threshold Amplitude: 0.625 V
Lead Channel Pacing Threshold Amplitude: 0.625 V
Lead Channel Pacing Threshold Amplitude: 0.875 V
Lead Channel Pacing Threshold Pulse Width: 0.5 ms
Lead Channel Pacing Threshold Pulse Width: 0.5 ms
Lead Channel Pacing Threshold Pulse Width: 0.5 ms
Lead Channel Sensing Intrinsic Amplitude: 12 mV
Lead Channel Sensing Intrinsic Amplitude: 2 mV
Lead Channel Setting Pacing Amplitude: 1.625
Lead Channel Setting Pacing Amplitude: 2 V
Lead Channel Setting Pacing Amplitude: 2 V
Lead Channel Setting Pacing Pulse Width: 0.5 ms
Lead Channel Setting Pacing Pulse Width: 0.5 ms
Lead Channel Setting Sensing Sensitivity: 2 mV
Pulse Gen Model: 3222
Pulse Gen Serial Number: 3979797

## 2024-11-20 NOTE — Progress Notes (Signed)
 Remote PPM Transmission

## 2024-11-22 ENCOUNTER — Other Ambulatory Visit: Payer: Self-pay | Admitting: Emergency Medicine

## 2024-11-22 NOTE — Telephone Encounter (Signed)
J45.909

## 2024-11-23 ENCOUNTER — Ambulatory Visit: Payer: Self-pay | Admitting: Cardiology

## 2024-11-27 ENCOUNTER — Telehealth: Payer: Self-pay

## 2024-11-27 NOTE — Telephone Encounter (Signed)
 Unable to reach [place]. LMTRC

## 2024-11-27 NOTE — Telephone Encounter (Signed)
 Copied from CRM [id number]. Topic: General - Other >> Nov 09, 2024  3:05 PM Nessti S wrote: Reason for CRM: called because a fax with the detailed written order faxed 04/28/2024. Would like a call back. >> Nov 27, 2024  1:21 PM Franky GRADE wrote: Suzen with [employer] is calling because they received the same incomplete forms they had previously received. The form was incomplete  due to not being signed or dated back in May. If the form was completed and signed and dated with May 2025 date they need that form to be faxed out to them. Fax [phone].

## 2024-11-30 ENCOUNTER — Encounter (HOSPITAL_COMMUNITY): Admitting: Internal Medicine

## 2024-12-01 ENCOUNTER — Telehealth: Payer: Self-pay

## 2024-12-01 ENCOUNTER — Other Ambulatory Visit: Payer: Self-pay

## 2024-12-01 ENCOUNTER — Telehealth (HOSPITAL_COMMUNITY): Payer: Self-pay

## 2024-12-01 ENCOUNTER — Other Ambulatory Visit (HOSPITAL_COMMUNITY): Payer: Self-pay

## 2024-12-01 ENCOUNTER — Telehealth (HOSPITAL_COMMUNITY): Payer: Self-pay | Admitting: Family Medicine

## 2024-12-01 MED ORDER — SPIRIVA RESPIMAT 2.5 MCG/ACT IN AERS: 2.0000 | INHALATION_SPRAY | Freq: Every day | RESPIRATORY_TRACT | 11 refills | Status: AC

## 2024-12-01 NOTE — Telephone Encounter (Signed)
 Called to confirm/remind patient of [facility].   Appointment:   [x] Confirmed  [] Left mess   [] No answer/No voice mail  [] VM Full/unable to leave message  [] Phone not in service  Patient reminded to bring all medications and/or complete list.  Confirmed patient has transportation. Gave directions, instructed to utilize valet parking.

## 2024-12-01 NOTE — Telephone Encounter (Addendum)
 Called and spoke with the pt regarding previous encounter. Pt also stated he needs PA for Dulera  inhaler for the next year. Please advise.

## 2024-12-01 NOTE — Telephone Encounter (Signed)
 Your request has been approved Approved. Authorization Expiration12/11/2025

## 2024-12-01 NOTE — Addendum Note (Signed)
 Addended byBETHA FRIES, Tavarious Freel A on: 12/01/2024 01:05 PM   Modules accepted: Orders

## 2024-12-01 NOTE — Telephone Encounter (Signed)
*  Pulm  Pharmacy Patient Advocate Encounter   Received notification from Pt Calls Messages that prior authorization for Brand Daliresp  500mcg is required/requested.   Insurance verification completed.   The patient is insured through [facility].   Per test claim: PA required; PA submitted to above mentioned insurance via Latent Key/confirmation #/EOC [id number] Status is pending   *submitted as expedited as patient only has a few pill left per note

## 2024-12-01 NOTE — Telephone Encounter (Addendum)
 Called and spoke with the pt and advised Daliresp  has been approved.  Pt also needs PA for Advair - new encounter created.  Pt also needs refills for Spiriva  sent to [employer] program. I have faxed this over at [phone]. Nothing further needed.

## 2024-12-01 NOTE — Telephone Encounter (Signed)
 Copied from CRM #[id number]. Topic: Clinical - Medication Prior Auth >> Nov 30, 2024  1:25 PM Isabell A wrote: Reason for CRM: Patient states roflumilast  (DALIRESP ) 500 MCG TABS tablet [[id number]] is requiring prior authorization - patient is requesting to speak with Marcus.   Callback number: [phone] >> Nov 30, 2024  3:54 PM Benton KIDD wrote: Patient is calling back because he is having issues with his medication patient is requesting to speak with noelle . Please give patient a call back [phone]   I have spoken with the pt in 11/21 encounter and awaiting pharmacy test claims.

## 2024-12-01 NOTE — Telephone Encounter (Signed)
 2 pills left,  Called and spoke with the pt. Pt states he only has 2 pills left of Daliresp  and insurance is denying to cover. Pt also states he needs info stating he can only take formulary of daliresp  for the new year so insurance can cover.  Pharmacy can you please do PA and advise what is needed for the pt as he states this information is needed by insurance.   We previously went through the same issues months back where we were having issues with insurance.  Please advise.

## 2024-12-04 ENCOUNTER — Telehealth (HOSPITAL_COMMUNITY): Payer: Self-pay | Admitting: Cardiology

## 2024-12-04 ENCOUNTER — Other Ambulatory Visit (HOSPITAL_COMMUNITY): Payer: Self-pay

## 2024-12-04 ENCOUNTER — Other Ambulatory Visit: Payer: Self-pay

## 2024-12-04 ENCOUNTER — Ambulatory Visit (HOSPITAL_COMMUNITY): Admission: RE | Admit: 2024-12-04 | Discharge: 2024-12-04 | Attending: Cardiology | Admitting: Cardiology

## 2024-12-04 ENCOUNTER — Encounter (HOSPITAL_COMMUNITY): Payer: Self-pay | Admitting: Cardiology

## 2024-12-04 VITALS — BP 140/78 | HR 91 | Ht 70.0 in | Wt 230.7 lb

## 2024-12-04 DIAGNOSIS — I5042 Chronic combined systolic (congestive) and diastolic (congestive) heart failure: Secondary | ICD-10-CM | POA: Diagnosis not present

## 2024-12-04 DIAGNOSIS — F1090 Alcohol use, unspecified, uncomplicated: Secondary | ICD-10-CM | POA: Diagnosis not present

## 2024-12-04 DIAGNOSIS — I428 Other cardiomyopathies: Secondary | ICD-10-CM | POA: Diagnosis not present

## 2024-12-04 DIAGNOSIS — F1010 Alcohol abuse, uncomplicated: Secondary | ICD-10-CM

## 2024-12-04 DIAGNOSIS — G4733 Obstructive sleep apnea (adult) (pediatric): Secondary | ICD-10-CM

## 2024-12-04 DIAGNOSIS — Z86718 Personal history of other venous thrombosis and embolism: Secondary | ICD-10-CM | POA: Diagnosis not present

## 2024-12-04 DIAGNOSIS — J4489 Other specified chronic obstructive pulmonary disease: Secondary | ICD-10-CM | POA: Diagnosis not present

## 2024-12-04 DIAGNOSIS — I110 Hypertensive heart disease with heart failure: Secondary | ICD-10-CM | POA: Diagnosis not present

## 2024-12-04 DIAGNOSIS — I10 Essential (primary) hypertension: Secondary | ICD-10-CM

## 2024-12-04 DIAGNOSIS — Z79899 Other long term (current) drug therapy: Secondary | ICD-10-CM | POA: Diagnosis not present

## 2024-12-04 DIAGNOSIS — G8929 Other chronic pain: Secondary | ICD-10-CM | POA: Diagnosis not present

## 2024-12-04 DIAGNOSIS — Z720 Tobacco use: Secondary | ICD-10-CM

## 2024-12-04 DIAGNOSIS — Z7901 Long term (current) use of anticoagulants: Secondary | ICD-10-CM | POA: Diagnosis not present

## 2024-12-04 DIAGNOSIS — F17290 Nicotine dependence, other tobacco product, uncomplicated: Secondary | ICD-10-CM | POA: Diagnosis not present

## 2024-12-04 DIAGNOSIS — K439 Ventral hernia without obstruction or gangrene: Secondary | ICD-10-CM | POA: Diagnosis not present

## 2024-12-04 DIAGNOSIS — E669 Obesity, unspecified: Secondary | ICD-10-CM | POA: Diagnosis not present

## 2024-12-04 DIAGNOSIS — Z59868 Other specified financial insecurity: Secondary | ICD-10-CM | POA: Diagnosis not present

## 2024-12-04 DIAGNOSIS — Z8249 Family history of ischemic heart disease and other diseases of the circulatory system: Secondary | ICD-10-CM | POA: Diagnosis not present

## 2024-12-04 LAB — BRAIN NATRIURETIC PEPTIDE: B Natriuretic Peptide: 435.7 pg/mL — ABNORMAL HIGH (ref 0.0–100.0)

## 2024-12-04 LAB — BASIC METABOLIC PANEL WITH GFR
Anion gap: 12 (ref 5–15)
BUN: 13 mg/dL (ref 8–23)
CO2: 31 mmol/L (ref 22–32)
Calcium: 8.8 mg/dL — ABNORMAL LOW (ref 8.9–10.3)
Chloride: 94 mmol/L — ABNORMAL LOW (ref 98–111)
Creatinine, Ser: 0.85 mg/dL (ref 0.61–1.24)
GFR, Estimated: >60 mL/min (ref >60)
Glucose, Bld: 162 mg/dL — ABNORMAL HIGH (ref 70–99)
Potassium: 3.7 mmol/L (ref 3.5–5.1)
Sodium: 137 mmol/L (ref 135–145)

## 2024-12-04 NOTE — Patient Instructions (Signed)
 No change in medications. Labs today - will call you if abnormal. Referral sent to Palliative Care - they should call you to schedule within 7 - 14 days. If not, please call them at number below. Return to see Dr. Cherrie in 3 months. CALL US  AT [phone] IN FEBRUARY TO SCHEDULE THIS APPOINTMENT IF YOU HAVE NOT HEARD FROM US .  Please call us  at [phone] if any questions or concerns prior to your next visit.

## 2024-12-04 NOTE — Progress Notes (Signed)
 ADVANCED HF CLINIC NOTE  PCP: Joshua Debby CROME, MD Pulmonology: Dr. Shelah HF Cardiologist: Dr. Cherrie   HPI: Mr. Youman is a 68 y.o. male with h/o obesity, HTN, COPD with ongoing tobacco use and chronic systolic heart failure.    Cath 03/2017 - normal coronaries with EF 40% and global HK.  He saw Dr. Court on 7/19 because of recurrent chest pain.  Myoview  stress test performed 8/19 showed EF 38% inferior scar with septal ischemia.  Echo 7/19  EF of 30 to 35%.  He was referred for cardiac CT.   Admitted 1/22 for acute on chronic hypoxic respiratory failure secondary to acute COPD and CHF w/ volume overload. He had massive diuresis w/ IV Lasix , diuresed down from admit wt of 265 lb to 210 lb. Echo 12/21, EF 40-45% (unchanged). RV mildly reduced, RVSP 49.   In 4/22, had near syncope, Zio showed intermittent CHB. Repeat echo EF 30-35%  Had St Jude dual chamber PM 04/16/21. Had some PMT and device adjusted by EP .   Underwent R/L cath in 9/22 for persistent SOB. No CAD. EF 35% Mildly elevated filling pressures with normal output.   Subsequently underwent CRT-D upgrade attempt on 10/07/21 by Dr. Inocencio due to high-degree of RV pacing. 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   S/p Barostim 2/23  S/p BiV upgrade 11/11/22. C/b LYE DVT and started on Eliquis .   S/p rib/costochondral fracture leading to chest wall hernia with partial herniation of the lateral aspect of the inferior lingula and left lower lobe. Seen by TCTS. Not surgical candidate.   Follow up 6/25, felt poorly and volume up, mostly WC-bound. Echo 6/25 EF 20-25%, mild LVH, G2DD, normal RV.  Follow up 9/25. Progressive decline in EF 20% and NYHA IIIb-IV with numerous GDMT intolerances. Palliaitve/Hospice care discussed at length with Dr. Bensimhon. He is not ready for this at this time. Echo 9/25 EF 20% RV ok   He is here today for HF follow up. He continues to feel poorly. He is using his BiPAP/O2  more frequently, compliant with nightly use, sometimes using for rescue O2. Assists with caring for his mother. Compliant with his medication, however metolazone  he takes as needed. Weighs every day. Weight down to 225 lb but now back up a couple pounds. Continues to have BLE edema and bloating. Smoking a few cig/day. Drinking 3/day. Using 2 BC powered a day for pain.  Cardiac studies: - RHC 3/24: RA 7, PA 39/20 (28), PCW 13, CO/CI (fick) 5.2/2.3, pvr 2.9  Review of systems complete and found to be negative unless listed in HPI.    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   Requires supplemental oxygen     Current Outpatient Medications  Medication Sig Dispense Refill   albuterol  (PROVENTIL ) (2.5 MG/3ML) 0.083% nebulizer solution USE 3 ML VIA NEBULIZER EVERY 4 HOURS AS NEEDED FOR  WHEEZING OR SHORTNESS OF BREATH 150 mL 11   allopurinol  (ZYLOPRIM ) 300 MG tablet TAKE 1 TABLET(300 MG) BY MOUTH DAILY 90 tablet 0   ALPRAZolam  (XANAX ) 0.5 MG tablet TAKE 1 TABLET(0.5 MG) BY MOUTH THREE TIMES DAILY AS NEEDED FOR ANXIETY 270 tablet 0   azelastine  (ASTELIN ) 0.1 % nasal spray Place 2 sprays into both nostrils 2 (two) times daily. Use in each nostril as directed 30 mL 1   B Complex-C-Biotin-D-Zinc-FA (VITAL-D RX PO) Take 1 tablet by mouth daily.     baclofen  (LIORESAL ) 10 MG tablet Take 10 mg by mouth as needed for muscle spasms.     DALIRESP  500 MCG TABS tablet Take 500 mcg by mouth daily.     diclofenac Sodium (VOLTAREN) 1 % GEL Apply 1 Application  topically in the morning and at bedtime.     EPINEPHrine  0.3 mg/0.3 mL IJ SOAJ injection Inject 0.3 mg into the muscle as needed for anaphylaxis. 1 each 0   fluticasone  (FLONASE ) 50 MCG/ACT nasal spray SHAKE LIQUID AND USE 2 SPRAYS IN EACH NOSTRIL DAILY 16 g 2   guaiFENesin  (MUCINEX ) 600 MG 12 hr tablet Take 600 mg by mouth as needed.     ipratropium (ATROVENT ) 0.06 % nasal spray Place 2 sprays into both nostrils 4 (four) times daily as needed for rhinitis. 45 mL 3   ipratropium-albuterol  (DUONEB) 0.5-2.5 (3) MG/3ML SOLN Take 3 mLs by nebulization every 4 (four) hours as needed. 360 mL 5   loratadine (CLARITIN) 10 MG tablet Take 10 mg by mouth every other day. As needed     losartan  (COZAAR ) 25 MG tablet Take 0.5 tablets (12.5 mg total) by mouth at bedtime. 45 tablet 3   methocarbamol  (ROBAXIN ) 500 MG tablet Take 1 tablet (500 mg total) by mouth every 8 (eight) hours as needed for muscle spasms. 30 tablet 1   metolazone  (ZAROXOLYN ) 2.5 MG tablet TAKE 1 TABLET BY MOUTH 2 TIMES A WEEK ON MONDAY AND FRIDAY 8 tablet 11   mometasone -formoterol  (DULERA ) 100-5 MCG/ACT AERO Inhale 2 puffs into the lungs 2 (two) times daily. 13 g 0   mometasone -formoterol  (DULERA ) 200-5 MCG/ACT AERO Inhale 2 puffs into the lungs 2 (two) times daily. 13 g 0   montelukast  (SINGULAIR ) 10 MG tablet TAKE 1 TABLET BY MOUTH EVERY DAY AT BEDTIME 90 tablet 1   nicotine  (NICODERM CQ ) 14 mg/24hr patch Place 1 patch (14 mg total) onto the skin daily. 28 patch 1   omeprazole  (PRILOSEC) 20 MG capsule TAKE 1 CAPSULE(20 MG) BY MOUTH TWICE DAILY BEFORE A MEAL FOR STOMACH ACID OR REFLUX 180 capsule 0   ondansetron  (ZOFRAN -ODT) 4 MG disintegrating tablet Take 1 tablet (4 mg total) by mouth every 8 (eight) hours as needed for nausea or vomiting. 20 tablet 0   oxyCODONE  (OXY IR/ROXICODONE ) 5 MG immediate release tablet Take 1 tablet (5 mg total) by mouth every 8 (eight) hours as needed for severe pain (pain score 7-10). 90 tablet 0   potassium  chloride SA (KLOR-CON  M) 20 MEQ tablet Take 40 mEq by mouth daily.     predniSONE  (DELTASONE ) 10 MG tablet TAKE 4 TABLETS BY MOUTH DAILY FOR 3DAYS, THEN 3 TABLETS BY MOUTH DAILY FOR 3DAYS, 2 TABLETS FOR 3 DAYS, THEN BACK TO 10MG  EVERY DAY 27 tablet 2   roflumilast  (DALIRESP ) 500 MCG TABS tablet Take 1 tablet (500 mcg total) by mouth daily. 30 tablet 11   Tiotropium Bromide  (SPIRIVA  RESPIMAT) 2.5 MCG/ACT AERS Inhale 2 puffs into the  lungs daily. 4 g 11   torsemide  (DEMADEX ) 20 MG tablet Take 2 tablets (40 mg total) by mouth daily. 90 tablet 11   VENTOLIN  HFA 108 (90 Base) MCG/ACT inhaler INHALE 2 PUFFS INTO THE LUNGS EVERY 6 HOURS AS NEEDED FOR WHEEZING 18 g 4   vitamin B-12 (CYANOCOBALAMIN ) 1000 MCG tablet Take 1,000 mcg by mouth daily.     VITAMIN D , CHOLECALCIFEROL, PO Take 1 tablet by mouth daily.     vitamin E 180 MG (400 UNITS) capsule Take 400 Units by mouth daily.     No current facility-administered medications for this encounter.   Allergies  Allergen Reactions   Bee Venom Shortness Of Breath and Swelling   Cefdinir  Other (See Comments)    Patient states he couldn't really breath    Spironolactone  Anaphylaxis   Bactrim  [Sulfamethoxazole -Trimethoprim ] Other (See Comments)    Mouth burning   Daliresp  [Roflumilast ] Other (See Comments)    Dizzy, headache, leg pain per patient. 02/25/22. Tolerates in low doses    Jardiance  [Empagliflozin ] Nausea Only and Other (See Comments)    Lightheadness Dizziness   Penicillins Swelling and Other (See Comments)    Childhood rxn--MD stated he almost died Has patient had a PCN reaction causing immediate rash, facial/tongue/throat swelling, SOB or lightheadedness with hypotension:Yes Has patient had a PCN reaction causing severe rash involving mucus membranes or skin necrosis:unsure Has patient had a PCN reaction that required hospitalization:unsure Has patient had a PCN reaction occurring within the last 10 years:No If all of the above answers  are NO, then may proceed with Cephalosporin use.     Clindamycin Other (See Comments)    Tongue swelling   Doxycycline  Nausea And Vomiting    Severe stomach upset per patient   E-Mycin [Erythromycin Base] Swelling   Levaquin  [Levofloxacin ] Rash   Rosuvastatin Other (See Comments)    Myalgias (intolerance)   Social History   Socioeconomic History   Marital status: Single    Spouse name: Not on file   Number of children: 1   Years of education: 12   Highest education level: 12th grade  Occupational History   Occupation: disabled  Tobacco Use   Smoking status: Former    Current packs/day: 0.00    Average packs/day: 2.0 packs/day for 46.0 years (92.0 ttl pk-yrs)    Types: Cigarettes    Start date: 01/28/1976    Quit date: 01/27/2022    Years since quitting: 2.8    Passive exposure: Past   Smokeless tobacco: Never   Tobacco comments:    Chews on a cigar.  09/16/2023 hfb        Smokes one occasionally per pt. 03/30/24  Vaping Use   Vaping status: Never Used  Substance and Sexual Activity   Alcohol use: Yes    Alcohol/week: 28.0 standard drinks of alcohol    Types: 28 Cans of beer per week   Drug use: No   Sexual activity: Yes    Partners: Female    Birth control/protection: None  Other Topics Concern   Not on file  Social History Narrative   Right handed   Tea sometimes and coffee (1/2 caffeine)   Lives alone-2025   Social Drivers of Health   Tobacco Use: Medium Risk (12/04/2024)   Patient History    Smoking Tobacco Use: Former    Smokeless Tobacco Use: Never    Passive Exposure: Past  Physicist, Medical Strain: Medium Risk (10/10/2024)   Overall Financial Resource Strain (CARDIA)  Difficulty of Paying Living Expenses: Somewhat hard  Food Insecurity: Food Insecurity Present (10/10/2024)   Epic    Worried About Programme Researcher, Broadcasting/film/video in the Last Year: Sometimes true    Ran Out of Food in the Last Year: Sometimes true  Transportation Needs: No Transportation  Needs (10/10/2024)   Epic    Lack of Transportation (Medical): No    Lack of Transportation (Non-Medical): No  Physical Activity: Inactive (10/10/2024)   Exercise Vital Sign    Days of Exercise per Week: 0 days    Minutes of Exercise per Session: Not on file  Stress: Stress Concern Present (10/10/2024)   [employer] of Occupational Health - Occupational Stress Questionnaire    Feeling of Stress: To some extent  Social Connections: Socially Isolated (10/10/2024)   Social Connection and Isolation Panel    Frequency of Communication with Friends and Family: More than three times a week    Frequency of Social Gatherings with Friends and Family: Once a week    Attends Religious Services: Never    Database Administrator or Organizations: No    Attends Engineer, Structural: Not on file    Marital Status: Divorced  Intimate Partner Violence: Not At Risk (06/02/2023)   Humiliation, Afraid, Rape, and Kick questionnaire    Fear of Current or Ex-Partner: No    Emotionally Abused: No    Physically Abused: No    Sexually Abused: No  Depression (PHQ2-9): Low Risk (08/24/2024)   Depression (PHQ2-9)    PHQ-2 Score: 0  Alcohol Screen: Low Risk (10/10/2024)   Alcohol Screen    Last Alcohol Screening Score (AUDIT): 5  Housing: Unknown (10/10/2024)   Epic    Unable to Pay for Housing in the Last Year: No    Number of Times Moved in the Last Year: Not on file    Homeless in the Last Year: No  Utilities: At Risk (01/12/2024)   AHC Utilities    Threatened with loss of utilities: Yes  Health Literacy: Adequate Health Literacy (01/12/2024)   B1300 Health Literacy    Frequency of need for help with medical instructions: Never   Family History  Problem Relation Age of Onset   Lung cancer Father    High blood pressure Mother    Diabetes Maternal Grandmother    Colon polyps Neg Hx    Esophageal cancer Neg Hx    Pancreatic cancer Neg Hx    Stomach cancer Neg Hx    Wt Readings from  Last 3 Encounters:  12/04/24 104.6 kg (230 lb 11.2 oz)  10/10/24 105.5 kg (232 lb 9.6 oz)  10/05/24 104.8 kg (231 lb)   BP (!) 140/78   Pulse 91   Ht 5' 10 (1.778 m)   Wt 104.6 kg (230 lb 11.2 oz)   SpO2 93%   BMI 33.10 kg/m   PHYSICAL EXAM: General: Chronically-ill appearing. No distress  Cardiac: JVP difficult to assess. No murmurs  Resp: Rhonchi throughout Abdomen: Taut, distended.  Extremities: Warm and dry.  3+ BLE edema, none in thighs.  Neuro: A&O x3. Affect pleasant.   ICD interrogation (personally reviewed) CorVue stable. No impedence. AT/AF burden <1%, 2 AMS 8-26s. Pacing >99%  ASSESSMENT & PLAN:  1. Chronic HFpEF, End Stage, NICM  - Suspect NICM due to HTN and inadequately treated OSA (He now reports improved nightly compliance w/ CPAP). - Echo 1/22: EF 40-45%. RV mildly reduced. RVSP 49 - Echo 07/31/21: EF 30-35 % and RV moderately  reduced.  - R/LHC 9/22 No CAD EF 35%, Mildly elevated filling pressures with normal output - BiV upgrade 11/11/22. C/b LYE DVT and started on Eliquis .  - s/p Barostim. - Echo 1/24: EF < 20% - RHC 3/24:  RA 7 PA 39/20 (28) PCW 13 Fick 5.2/2.3 - Echo 6/24: EF 35-40% RV low normal  (EF improving with CRT) - Echo 6/25: EF 20-25%, mild LVH, G2DD, normal RV. - Echo 9/25: EF 20% RV ok  - Continues to deteriorate NYHA IV. HF symptoms compounded by COPD, obesity, chronic pain and orthopedic issues. GDMT limited by multiple side effects. - Volume okay by CorVue. Volume up by exam. - Continue torsemide  40 mg daily. Takes metolazone  2.5 mg 2x weekly when able or as needed - Intolerant losartan  due to dizziness  - No spiro (h/o Anaphylaxis).  - Intolerant to Jardiance  (positional dizziness and nausea).  - Intolerant bisoprolol  due to fatigue, coreg  due to dizziness, and toprol  due to dry mouth.  - Focus has been on tight volume management and CRT - Using BC powders x2 daily; cessation discussed - Now end-stage with multiple co-morbidities. Long  discussion previously with Dr. Bensimhon regarding potential Palliative Care/Hospice. He is agreeable today for outpatient palliative care referral, states that he knows he does not have a lot of time, not ready for hospice. Plan to discuss his wish for code status at follow up visit.  2.  HTN - BP stable  3. OSA - Compliant w/ BIPAP  4. Chest wall hernia - has seen TCTS.  - Not surgical candidate  5. Tobacco and ETOH use - Still smoking, drinking 3/day  Follow up in 3 months with Dr. Cherrie  Tally Mattox, NP 3:59 PM

## 2024-12-05 ENCOUNTER — Other Ambulatory Visit: Payer: Self-pay | Admitting: Emergency Medicine

## 2024-12-05 ENCOUNTER — Telehealth: Payer: Self-pay

## 2024-12-05 NOTE — Telephone Encounter (Signed)
 Unable to reach [place], [facility]. [employer] [phone]

## 2024-12-05 NOTE — Telephone Encounter (Signed)
 Unable to reach Robert Church. I have left a message for her to return my call. I have documented in the original encounter I will close this one as it is a duplicate call.

## 2024-12-05 NOTE — Telephone Encounter (Signed)
 Copied from CRM #[id number]. Topic: General - Call Back - No Documentation >> Dec 05, 2024 11:00 AM Roselie BROCKS wrote: Reason for CRM: Mrs. Justino states she is returning a call from, Armin  Please return a call to [employer] [phone]

## 2024-12-05 NOTE — Telephone Encounter (Signed)
 Pt is aware and will call back at the beginning of next year. Nothing further needed.

## 2024-12-06 ENCOUNTER — Ambulatory Visit (HOSPITAL_COMMUNITY): Payer: Self-pay | Admitting: Cardiology

## 2024-12-06 NOTE — Telephone Encounter (Signed)
 Unable to reach [place], [facility]. This is my 3rd and final attempt I will close this call at this time. If Robert Church reaches back out please reopen this call.

## 2024-12-11 ENCOUNTER — Telehealth: Payer: Self-pay

## 2024-12-11 NOTE — Telephone Encounter (Signed)
 Received fax from [employer] regarding Spiriva  assistance.  Pt is eligible through Dec. 2026 to receive assistance.  If further assistance is needed after date, a new application will be needed.  Placing in scan folder.

## 2024-12-12 ENCOUNTER — Telehealth: Payer: Self-pay

## 2024-12-12 NOTE — Telephone Encounter (Signed)
 Copied from CRM [id number]. Topic: General - Call Back - No Documentation >> Dec 12, 2024  9:59 AM Alfonso ORN wrote: Reason for CRM: Ms Justino called back to return call from [facility] regarding power chair. Contacted CAL who advised message be sent for f/u. Please contact [place] at   callback [phone] >> Dec 12, 2024 10:08 AM CMA Lila C wrote: Wrong office

## 2024-12-15 ENCOUNTER — Other Ambulatory Visit: Payer: Self-pay | Admitting: Internal Medicine

## 2024-12-15 DIAGNOSIS — M5030 Other cervical disc degeneration, unspecified cervical region: Secondary | ICD-10-CM

## 2024-12-15 DIAGNOSIS — M170 Bilateral primary osteoarthritis of knee: Secondary | ICD-10-CM

## 2024-12-15 DIAGNOSIS — M47816 Spondylosis without myelopathy or radiculopathy, lumbar region: Secondary | ICD-10-CM

## 2024-12-15 DIAGNOSIS — M545 Low back pain, unspecified: Secondary | ICD-10-CM

## 2024-12-18 NOTE — Telephone Encounter (Signed)
 Unable to Reach Robert Church and she will not be back in the office until 12/22/2024. Left a message to return my call once she is back in office.

## 2024-12-22 ENCOUNTER — Encounter: Payer: Self-pay | Admitting: Internal Medicine

## 2024-12-22 ENCOUNTER — Telehealth: Payer: Self-pay

## 2024-12-22 ENCOUNTER — Other Ambulatory Visit (HOSPITAL_COMMUNITY): Payer: Self-pay

## 2024-12-22 NOTE — Telephone Encounter (Signed)
 Unable to reach [place] . Will reach back out Monday.

## 2024-12-22 NOTE — Telephone Encounter (Signed)
 Should be Dulera  100. He states he talked to his insurance company and they need a PA.

## 2024-12-22 NOTE — Telephone Encounter (Signed)
 Copied from CRM #[id number]. Topic: General - Call Back - No Documentation >> Dec 22, 2024  8:12 AM Joesph PARAS wrote: Reason for CRM: Patient retuning call to [facility], please return call to patient. States is not about patient assitance for Spiriva .    Called and spoke with the pt. Pt calling in regards to PA for Dulera .  Please advise PA

## 2024-12-25 ENCOUNTER — Other Ambulatory Visit (HOSPITAL_COMMUNITY): Payer: Self-pay

## 2024-12-25 NOTE — Telephone Encounter (Signed)
 Spoke with the pt and he has advised that insurance will not cover Dulera  and it is to expensive.  I advised pt he has a deductible to meet causing high price.  Routing back to pharmacy, can you advise of formulary alternative for Dulera 

## 2024-12-26 NOTE — Telephone Encounter (Signed)
 Generic Symbicort  would be closest to Dulera . Generic Advair  looks the most affordable.  You will have to discuss with him ane see which he is willing to try - he has had trouble in the past with side effects and ineffective (less effective) inhalers when we have changed.

## 2024-12-27 ENCOUNTER — Telehealth: Payer: Self-pay | Admitting: Emergency Medicine

## 2024-12-27 NOTE — Telephone Encounter (Signed)
 Called and spoke with the pt, and pt verbalized understanding. Pt states he will contact insurance because he pays monthly fee that allows him to meet his deductible.  Pt would like to hold off starting Generic Advair  and will call back once he speaks with insurance.

## 2024-12-27 NOTE — Telephone Encounter (Signed)
 Unable to reach Robert Church. I left a message for her to return my call. I will close this call if she calls back please reopen the call.

## 2024-12-27 NOTE — Telephone Encounter (Signed)
 Error

## 2024-12-27 NOTE — Telephone Encounter (Signed)
 This Pt has called requesting a document that states he is disable on a ventilator and in unable to

## 2024-12-27 NOTE — Telephone Encounter (Addendum)
 Called and spoke with the pt and he explained how he is unable to pay his bills and his ex wife is helping him pay them. Pts ex wife was unable to pay his bill due to the pt not being present. Pt stated he is wheelchair bound and cannot walk long distances to be able to go with her to pay bills. He is requesting a letter saying he is disabled and relies on a ventilator and oxygen  at 2-2.5L/min Pt stated bill must be paid by 1/8 at 11am and needs this letter before then.  Dr. Shelah can you advise if pt is ventilator dependent and info you would like in the letter.

## 2024-12-27 NOTE — Telephone Encounter (Signed)
 I called and spoke with the pt .

## 2024-12-28 ENCOUNTER — Other Ambulatory Visit: Payer: Self-pay | Admitting: Family Medicine

## 2024-12-28 ENCOUNTER — Other Ambulatory Visit: Payer: Self-pay | Admitting: Internal Medicine

## 2024-12-28 ENCOUNTER — Telehealth: Payer: Self-pay

## 2024-12-28 DIAGNOSIS — F411 Generalized anxiety disorder: Secondary | ICD-10-CM

## 2024-12-28 DIAGNOSIS — R110 Nausea: Secondary | ICD-10-CM

## 2024-12-28 MED ORDER — ONDANSETRON 4 MG PO TBDP: 4.0000 mg | ORAL_TABLET | Freq: Three times a day (TID) | ORAL | 0 refills | Status: AC | PRN

## 2024-12-28 NOTE — Telephone Encounter (Signed)
 Letter has been typed and printed. Gave letter to pts ex wife who presented in the office. Nothing further needed.

## 2024-12-28 NOTE — Telephone Encounter (Unsigned)
 Copied from CRM [id number]. Topic: Clinical - Prescription Issue >> Dec 28, 2024  4:14 PM Alfonso ORN wrote: Reason for CRM: pt called to see if he can get refill on nausea med prescribed by Alvia during acute visit he takes as needed and req if pcp can prescribe if not able to be filled by Alvia. Please advise

## 2024-12-28 NOTE — Telephone Encounter (Signed)
 Okay for us  to write a letter that indicates that >> Robert Church has severe chronic lung disease and associated hypoxemic respiratory failure.  He requires chronic pulmonary medications and also oxygen  continuously during the day. He is dependent on noninvasive ventilator support at night.  His mobility is limited as is his ability to leave the home.  He uses a wheelchair and will require assistance in order to leave the home. Please take his medical needs and their impact into account as you assess his ability to carry out activities of daily living, paying bills, etc.

## 2024-12-28 NOTE — Telephone Encounter (Signed)
 Copied from CRM [id number]. Topic: Clinical - Medication Refill >> Dec 28, 2024  4:05 PM Alfonso ORN wrote: Medication: ondansetron  (ZOFRAN -ODT) 4 MG disintegrating tablet [[id number]]  Has the patient contacted their pharmacy? Yes   This is the patient's preferred pharmacy:  [id number] [place], [place] - [address] AT [facility] [address] [place] [address] Phone: [phone] Fax: [phone]   Is this the correct pharmacy for this prescription? Yes If no, delete pharmacy and type the correct one.   Has the prescription been filled recently? No  Is the patient out of the medication? no  Has the patient been seen for an appointment in the last year OR does the patient have an upcoming appointment? Yes  Can we respond through MyChart? Yes

## 2024-12-29 ENCOUNTER — Encounter: Payer: Self-pay | Admitting: Internal Medicine

## 2024-12-29 ENCOUNTER — Other Ambulatory Visit: Payer: Self-pay | Admitting: Internal Medicine

## 2024-12-29 DIAGNOSIS — F411 Generalized anxiety disorder: Secondary | ICD-10-CM

## 2024-12-29 NOTE — Telephone Encounter (Signed)
 LVM for patient, he is overdue for PCP visit

## 2024-12-29 NOTE — Telephone Encounter (Signed)
 Copied from CRM #[id number]. Topic: Clinical - Prescription Issue >> Dec 29, 2024 12:04 PM Alfonso ORN wrote: Reason for CRM: pt called to return call. Next avail ov was scheduled for med refill in febraury . Pt is requesting fill of medication while awaiting feb appt. Please call pt to advise >> Dec 29, 2024  3:32 PM Drema MATSU wrote: Patient is requesting a callback regarding medication. He is needing just enough medication until his appointment. He only has 1 pill left.

## 2024-12-29 NOTE — Telephone Encounter (Signed)
 Copied from CRM [id number]. Topic: Clinical - Medication Refill >> Dec 29, 2024  9:40 AM Sophia H wrote: Medication: ALPRAZolam  (XANAX ) 0.5 MG tablet  Has the patient contacted their pharmacy? Yes  This is the patient's preferred pharmacy:  [id number] [place], [place] - [address] AT [facility] & [address] [place] [address] Phone: [phone] Fax: [phone]   Is this the correct pharmacy for this prescription? Yes If no, delete pharmacy and type the correct one.   Has the prescription been filled recently? Yes  Is the patient out of the medication? Yes, completely out/   Has the patient been seen for an appointment in the last year OR does the patient have an upcoming appointment? Yes, seen back in October.   Can we respond through MyChart? Yes  Agent: Please be advised that Rx refills may take up to 3 business days. We ask that you follow-up with your pharmacy.

## 2024-12-29 NOTE — Telephone Encounter (Signed)
 Medication refilled on 01/08

## 2025-01-01 ENCOUNTER — Other Ambulatory Visit: Payer: Self-pay

## 2025-01-01 ENCOUNTER — Telehealth: Payer: Self-pay

## 2025-01-01 DIAGNOSIS — F411 Generalized anxiety disorder: Secondary | ICD-10-CM

## 2025-01-01 MED ORDER — ALPRAZOLAM 0.5 MG PO TABS: 0.5000 mg | ORAL_TABLET | Freq: Three times a day (TID) | ORAL | 0 refills | Status: DC | PRN

## 2025-01-01 NOTE — Telephone Encounter (Signed)
 Unable to reach [place]. LMTRC

## 2025-01-01 NOTE — Telephone Encounter (Signed)
 Copied from CRM #[id number]. Topic: General - Call Back - No Documentation >> Jan 01, 2025  3:00 PM Rea C wrote: Reason for CRM: Suzen from [employer] and [employer] is calling to speak with [employer].   Contact: [phone] (M)

## 2025-01-02 ENCOUNTER — Ambulatory Visit: Admitting: Internal Medicine

## 2025-01-02 ENCOUNTER — Other Ambulatory Visit (HOSPITAL_COMMUNITY): Payer: Self-pay

## 2025-01-02 ENCOUNTER — Other Ambulatory Visit: Payer: Self-pay | Admitting: Emergency Medicine

## 2025-01-05 NOTE — Telephone Encounter (Signed)
 Refilled! Have a good weekend.

## 2025-01-09 ENCOUNTER — Telehealth: Payer: Self-pay

## 2025-01-09 NOTE — Telephone Encounter (Signed)
 Copied from CRM [id number]. Topic: Appointments - Scheduling Inquiry for Clinic >> Jan 09, 2025 10:26 AM Robert Church wrote: Reason for CRM: pt is wanting to see about getting a video visit tomorrow instead coming in due to transportation. It would not allow me change to the video visit.   Spoke with patient appointment is now a video Visit

## 2025-01-09 NOTE — Telephone Encounter (Signed)
 Pt is scheduled to see Almarie Ferrari, NP tomorrow for a NIV f/u. I called [employer] and spoke to [facility]. Arvella was able to find a NIV download and did fax this to me. Fax was received. NFN

## 2025-01-10 ENCOUNTER — Telehealth: Payer: Self-pay | Admitting: Primary Care

## 2025-01-10 ENCOUNTER — Telehealth (INDEPENDENT_AMBULATORY_CARE_PROVIDER_SITE_OTHER): Admitting: Primary Care

## 2025-01-10 ENCOUNTER — Other Ambulatory Visit (HOSPITAL_COMMUNITY): Payer: Self-pay

## 2025-01-10 ENCOUNTER — Ambulatory Visit: Admitting: Primary Care

## 2025-01-10 ENCOUNTER — Encounter: Payer: Self-pay | Admitting: Primary Care

## 2025-01-10 VITALS — Ht 70.0 in | Wt 229.6 lb

## 2025-01-10 DIAGNOSIS — G4733 Obstructive sleep apnea (adult) (pediatric): Secondary | ICD-10-CM | POA: Diagnosis not present

## 2025-01-10 DIAGNOSIS — J302 Other seasonal allergic rhinitis: Secondary | ICD-10-CM | POA: Diagnosis not present

## 2025-01-10 DIAGNOSIS — J449 Chronic obstructive pulmonary disease, unspecified: Secondary | ICD-10-CM | POA: Diagnosis not present

## 2025-01-10 DIAGNOSIS — J9611 Chronic respiratory failure with hypoxia: Secondary | ICD-10-CM | POA: Diagnosis not present

## 2025-01-10 DIAGNOSIS — Z87891 Personal history of nicotine dependence: Secondary | ICD-10-CM | POA: Diagnosis not present

## 2025-01-10 DIAGNOSIS — J441 Chronic obstructive pulmonary disease with (acute) exacerbation: Secondary | ICD-10-CM

## 2025-01-10 DIAGNOSIS — R5381 Other malaise: Secondary | ICD-10-CM

## 2025-01-10 DIAGNOSIS — Z9181 History of falling: Secondary | ICD-10-CM

## 2025-01-10 MED ORDER — ALBUTEROL SULFATE (2.5 MG/3ML) 0.083% IN NEBU: 2.5000 mg | INHALATION_SOLUTION | Freq: Four times a day (QID) | RESPIRATORY_TRACT | 11 refills | Status: AC | PRN

## 2025-01-10 NOTE — Telephone Encounter (Unsigned)
 Copied from CRM #[id number]. Topic: General - Call Back - No Documentation >> Jan 10, 2025  9:18 AM Rea BROCKS wrote: Reason for CRM: Suzen from [facility] and [employer] is calling to speak with [employer].   P: [phone]

## 2025-01-10 NOTE — Telephone Encounter (Signed)
 Unable to reach [place]. LMTRC

## 2025-01-10 NOTE — Telephone Encounter (Signed)
 Needs 3 month FU with Dr. Byrum

## 2025-01-10 NOTE — Telephone Encounter (Signed)
 Attempted to call pt. Left voicemail fro pt to call office to get scheduled.

## 2025-01-10 NOTE — Progress Notes (Signed)
 Virtual Visit via Video Note  I connected with Robert Church on 01/10/25 at  3:30 PM EST by a video enabled telemedicine application and verified that I am speaking with the correct person using two identifiers.  Location: Patient: Home Provider: Office    I discussed the limitations of evaluation and management by telemedicine and the availability of in person appointments. The patient expressed understanding and agreed to proceed.  History of Present Illness: 69 year old male, former smoker. PMH significant for CHF, HTN, coronary artery calcification, COPD, chronic respiratory failure, GERD, alcoholic liver cirrhosis, type 2 diabetes, hyperlipidemia, obesity. Patient of Dr. Shelah.   Previous [facility] pulmonary encounter:  10/05/24- Dr. 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  He has been unable to tolerate Ohtuvayre  (but he was on daliresp  at the time), is currently managed on prednisone  10 mg, Dulera , Spiriva , namebrand Daliresp  (he has trouble with the generic).  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   Assessment and Plan: COPD (chronic obstructive pulmonary disease) (HCC) Very severe disease on maximal therapy.  He continues to smoke.  Have discussed with him the need to stop but he is in the precontemplative phase.  Continue his prednisone  10 mg, Spiriva  and Dulera .  He will stay on either Daliresp  or Ohtuvayre  -he is considering trying to get back to the inhaled medication to see if he can tolerate.  If he is going to get back on Daliresp  then we will need to fill out paperwork to clarify that he needs to be on the namebrand and not the generic.  This has gone to his insurance in the past.   Discussed with him the recommendation for vaccines but he refuses these.   Chronic respiratory failure with hypoxia (HCC) Multifactorial due to his COPD, restrictive disease from obesity and is lung herniation, OSA/OHS.  We will continue his trilogy ventilator while sleeping and napping.  Continue to do surveillance for any evidence of daytime hypoxemia.   Obstructive sleep apnea syndrome Continue his trilogy ventilator as he has been using it   Seasonal allergic rhinitis Continue current regimen     Follow Up Instructions: Follow-up with APP in 3 months or as needed.  01/10/2025- interim hx  Discussed the use of AI scribe software for clinical note transcription with the patient, who gave verbal consent to proceed.  History of Present Illness Robert Church is a 69 year old male with COPD, chronic respiratory failure with hypoxia, and obstructive sleep apnea who presents for a follow-up visit.  He has severe COPD, chronic respiratory failure with hypoxia, ischemic cardiomyopathy, and obesity hypoventilation syndrome. He uses a noninvasive ventilator consistently, averaging 17 hours of use per day over the last 30 days. He experiences shortness of breath during exertion, such as preparing breakfast, and does not use oxygen  during these activities.  He has a history of a rib fracture from February of the previous year, which resulted in a herniated lung following excessive coughing due to pneumonia and RSV. Not surgical candidate. He continues to have difficulty breathing and is unable to perform activities like grocery shopping.  His medication regimen for COPD includes Dulera , Spiriva , prednisone  10 mg, Singulair , and Daliresp  500 mcg in divided doses. He  also takes torsemide  40 mg daily and metolazone  twice a week. He occasionally uses Xanax  for panic attacks, which he describes as feeling like a panic attack when experiencing shortness of breath.  He has experienced falls and  reports weakness and pain in his legs, attributing it to tight muscles. He uses a walker but not consistently. He is concerned about potential osteoporosis due to long-term prednisone  use and reports developing 'knots' on his joints, including fingers and knees.  He has been on prednisone  for 12-15 years, initially prescribed during a pneumonia episode. He wishes to taper off the medication due to concerns about side effects, including osteoporosis and joint issues.    Observations/Objective:  Appears well; No over respiratory distress. Able to speak in full sentences  Assessment and Plan:  Assessment and Plan Assessment & Plan Chronic obstructive pulmonary disease with chronic respiratory failure and hypoxia Severe COPD with chronic respiratory failure and hypoxia, multifactorial due to COPD, ischemic cardiomyopathy, and obesity hypoventilation syndrome. Reports exertional dyspnea, particularly in the mornings when making breakfast on room air. Uses noninvasive ventilator for 17 hours daily. No issues with ventilator use. Experiences increased congestion and difficulty with exertion. Long-term prednisone  use raises concerns about drug induced diabetes, osteoporosis and avascular necrosis. - Recommended wearing oxygen  during exertion to reduce ventilator dependency - Continue current medications: Dulera , Spiriva , prednisone , Singulair , and Daliresp . - Decrease prednisone  to 5 mg daily, then consider tapering to every other day after 6 weeks. - Ensure primary care provider performs basic labs (CBC, CMP, BMP) and chest x-ray if possible. - Discuss with primary care provider about potential bronchitis treatment if congestion persists.  Obstructive sleep apnea/ obesity hypoventilation syndrome  Managed with noninvasive ventilator use during sleep. No issues reported with current ventilator use.  - Continue noninvasive ventilator use during sleep.  Deconditioning with falls Reports  deconditioning with falls, weakness, and pain in legs. Uses a walker but experiences difficulty with mobility. No current home physical therapy services. - Arranged for home physical therapy to address deconditioning and fall risk.  Ischemic cardiomyopathy Contributing to chronic respiratory failure. Reports abdominal distension, possibly related to fluid retention. Currently on torsemide  and metolazone  for fluid management. - Continue torsemide  40 mg daily as tolerated. - Continue metolazone  twice weekly as prescribed. - Ensure primary care provider monitors fluid status and adjusts diuretics as needed.   Follow Up Instructions:   3 months with Dr. Shelah  I discussed the assessment and treatment plan with the patient. The patient was provided an opportunity to ask questions and all were answered. The patient agreed with the plan and demonstrated an understanding of the instructions.   The patient was advised to call back or seek an in-person evaluation if the symptoms worsen or if the condition fails to improve as anticipated.  I provided 25 minutes of non-face-to-face time during this encounter.   Almarie LELON Ferrari, NP

## 2025-01-10 NOTE — Patient Instructions (Signed)
" °  VISIT SUMMARY: During your follow-up visit, we discussed your chronic obstructive pulmonary disease (COPD), chronic respiratory failure with hypoxia, obstructive sleep apnea, deconditioning with falls, and ischemic cardiomyopathy. We reviewed your current symptoms, medication regimen, and concerns about long-term prednisone  use. We also addressed your recent falls and mobility issues.  YOUR PLAN: -CHRONIC OBSTRUCTIVE PULMONARY DISEASE WITH CHRONIC RESPIRATORY FAILURE AND HYPOXIA: This condition involves severe lung disease that makes it hard to breathe and results in low oxygen  levels. We recommend you wear oxygen  during exertion to reduce ventilator use. Continue your current medications: Dulera , Spiriva , prednisone , Singulair , and Daliresp . We will decrease your prednisone  to 5 mg daily and consider tapering to every other day after 6 weeks. Your primary care provider should perform basic labs (CBC, CMP, BMP) and a chest x-ray if possible. Discuss with your primary care provider about potential bronchitis treatment if congestion persists.  -OBSTRUCTIVE SLEEP APNEA: This condition causes breathing to stop and start during sleep. Continue using your noninvasive ventilator during sleep as you have been doing.  -DECONDITIONING WITH FALLS: This refers to a decline in physical fitness and strength, leading to falls. We have arranged for home physical therapy to help address your deconditioning and reduce your fall risk.  -ISCHEMIC CARDIOMYOPATHY: This is a condition where the heart muscle is weakened due to reduced blood flow. It contributes to your chronic respiratory failure. Continue taking torsemide  40 mg daily and metolazone  twice weekly as prescribed. Your primary care provider should monitor your fluid status and adjust diuretics as needed.  INSTRUCTIONS: Please follow up with your primary care provider to perform the recommended labs and chest x-ray, and to discuss potential bronchitis treatment  if your congestion persists. Continue using your noninvasive ventilator during sleep and wear oxygen  during exertion. We will decrease your prednisone  to 5 mg daily and consider tapering to every other day after 6 weeks. Home physical therapy has been arranged to help with your deconditioning and fall risk. Ensure your primary care provider monitors your fluid status and adjusts your diuretics as needed.  Follow-up 3 months with Dr. Shelah Mow text generated by [employer].   "

## 2025-01-11 ENCOUNTER — Ambulatory Visit: Admitting: Internal Medicine

## 2025-01-11 ENCOUNTER — Encounter: Payer: Self-pay | Admitting: Internal Medicine

## 2025-01-11 VITALS — BP 124/74 | HR 93 | Temp 97.8°F | Resp 16 | Ht 70.0 in | Wt 230.2 lb

## 2025-01-11 DIAGNOSIS — J441 Chronic obstructive pulmonary disease with (acute) exacerbation: Secondary | ICD-10-CM

## 2025-01-11 DIAGNOSIS — I428 Other cardiomyopathies: Secondary | ICD-10-CM | POA: Diagnosis not present

## 2025-01-11 DIAGNOSIS — Z0000 Encounter for general adult medical examination without abnormal findings: Secondary | ICD-10-CM

## 2025-01-11 DIAGNOSIS — K7030 Alcoholic cirrhosis of liver without ascites: Secondary | ICD-10-CM | POA: Diagnosis not present

## 2025-01-11 DIAGNOSIS — E781 Pure hyperglyceridemia: Secondary | ICD-10-CM | POA: Diagnosis not present

## 2025-01-11 DIAGNOSIS — E785 Hyperlipidemia, unspecified: Secondary | ICD-10-CM

## 2025-01-11 DIAGNOSIS — M1A0221 Idiopathic chronic gout, left elbow, with tophus (tophi): Secondary | ICD-10-CM | POA: Diagnosis not present

## 2025-01-11 DIAGNOSIS — E11610 Type 2 diabetes mellitus with diabetic neuropathic arthropathy: Secondary | ICD-10-CM

## 2025-01-11 DIAGNOSIS — J431 Panlobular emphysema: Secondary | ICD-10-CM

## 2025-01-11 DIAGNOSIS — I5042 Chronic combined systolic (congestive) and diastolic (congestive) heart failure: Secondary | ICD-10-CM | POA: Diagnosis not present

## 2025-01-11 DIAGNOSIS — Z0001 Encounter for general adult medical examination with abnormal findings: Secondary | ICD-10-CM

## 2025-01-11 DIAGNOSIS — J9611 Chronic respiratory failure with hypoxia: Secondary | ICD-10-CM

## 2025-01-11 DIAGNOSIS — F17210 Nicotine dependence, cigarettes, uncomplicated: Secondary | ICD-10-CM

## 2025-01-11 LAB — HEPATIC FUNCTION PANEL
ALT: 17 U/L (ref 3–53)
AST: 20 U/L (ref 5–37)
Albumin: 3.8 g/dL (ref 3.5–5.2)
Alkaline Phosphatase: 56 U/L (ref 39–117)
Bilirubin, Direct: 0.2 mg/dL (ref 0.1–0.3)
Total Bilirubin: 1 mg/dL (ref 0.2–1.2)
Total Protein: 6.3 g/dL (ref 6.0–8.3)

## 2025-01-11 LAB — LIPID PANEL
Cholesterol: 179 mg/dL (ref 28–200)
HDL: 51.9 mg/dL
LDL Cholesterol: 107 mg/dL — ABNORMAL HIGH (ref 10–99)
NonHDL: 127.48
Total CHOL/HDL Ratio: 3
Triglycerides: 100 mg/dL (ref 10.0–149.0)
VLDL: 20 mg/dL (ref 0.0–40.0)

## 2025-01-11 LAB — CBC WITH DIFFERENTIAL/PLATELET
Basophils Absolute: 0 K/uL (ref 0.0–0.1)
Basophils Relative: 0.3 % (ref 0.0–3.0)
Eosinophils Absolute: 0 K/uL (ref 0.0–0.7)
Eosinophils Relative: 0.3 % (ref 0.0–5.0)
HCT: 47.5 % (ref 39.0–52.0)
Hemoglobin: 16 g/dL (ref 13.0–17.0)
Lymphocytes Relative: 5.4 % — ABNORMAL LOW (ref 12.0–46.0)
Lymphs Abs: 0.5 K/uL — ABNORMAL LOW (ref 0.7–4.0)
MCHC: 33.6 g/dL (ref 30.0–36.0)
MCV: 97.1 fl (ref 78.0–100.0)
Monocytes Absolute: 0.5 K/uL (ref 0.1–1.0)
Monocytes Relative: 6.5 % (ref 3.0–12.0)
Neutro Abs: 7.4 K/uL (ref 1.4–7.7)
Neutrophils Relative %: 87.5 % — ABNORMAL HIGH (ref 43.0–77.0)
Platelets: 166 K/uL (ref 150.0–400.0)
RBC: 4.89 Mil/uL (ref 4.22–5.81)
RDW: 14.3 % (ref 11.5–15.5)
WBC: 8.5 K/uL (ref 4.0–10.5)

## 2025-01-11 LAB — HEMOGLOBIN A1C: Hgb A1c MFr Bld: 6.8 % — ABNORMAL HIGH (ref 4.6–6.5)

## 2025-01-11 LAB — BRAIN NATRIURETIC PEPTIDE: Pro B Natriuretic peptide (BNP): 559 pg/mL — ABNORMAL HIGH (ref 1.0–100.0)

## 2025-01-11 LAB — URINALYSIS, ROUTINE W REFLEX MICROSCOPIC
Bilirubin Urine: NEGATIVE
Hgb urine dipstick: NEGATIVE
Ketones, ur: NEGATIVE
Leukocytes,Ua: NEGATIVE
Nitrite: NEGATIVE
Specific Gravity, Urine: 1.01 (ref 1.000–1.030)
Total Protein, Urine: NEGATIVE
Urine Glucose: NEGATIVE
Urobilinogen, UA: 0.2 (ref 0.0–1.0)
WBC, UA: NONE SEEN — AB
pH: 6 (ref 5.0–8.0)

## 2025-01-11 LAB — PROTIME-INR
INR: 1.1 ratio — ABNORMAL HIGH (ref 0.8–1.0)
Prothrombin Time: 11.8 s (ref 9.6–13.1)

## 2025-01-11 LAB — URIC ACID: Uric Acid, Serum: 12.5 mg/dL — ABNORMAL HIGH (ref 4.0–7.8)

## 2025-01-11 LAB — MICROALBUMIN / CREATININE URINE RATIO
Creatinine,U: 83 mg/dL
Microalb Creat Ratio: 32.7 mg/g — ABNORMAL HIGH (ref 0.0–30.0)
Microalb, Ur: 2.7 mg/dL — ABNORMAL HIGH (ref 0.7–1.9)
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
## 2025-01-11 NOTE — Patient Instructions (Signed)
 Health Maintenance, Male  Adopting a healthy lifestyle and getting preventive care are important in promoting health and wellness. Ask your health care provider about:  The right schedule for you to have regular tests and exams.  Things you can do on your own to prevent diseases and keep yourself healthy.  What should I know about diet, weight, and exercise?  Eat a healthy diet    Eat a diet that includes plenty of vegetables, fruits, low-fat dairy products, and lean protein.  Do not eat a lot of foods that are high in solid fats, added sugars, or sodium.  Maintain a healthy weight  Body mass index (BMI) is a measurement that can be used to identify possible weight problems. It estimates body fat based on height and weight. Your health care provider can help determine your BMI and help you achieve or maintain a healthy weight.  Get regular exercise  Get regular exercise. This is one of the most important things you can do for your health. Most adults should:  Exercise for at least 150 minutes each week. The exercise should increase your heart rate and make you sweat (moderate-intensity exercise).  Do strengthening exercises at least twice a week. This is in addition to the moderate-intensity exercise.  Spend less time sitting. Even light physical activity can be beneficial.  Watch cholesterol and blood lipids  Have your blood tested for lipids and cholesterol at 67 years of age, then have this test every 5 years.  You may need to have your cholesterol levels checked more often if:  Your lipid or cholesterol levels are high.  You are older than 67 years of age.  You are at high risk for heart disease.  What should I know about cancer screening?  Many types of cancers can be detected early and may often be prevented. Depending on your health history and family history, you may need to have cancer screening at various ages. This may include screening for:  Colorectal cancer.  Prostate cancer.  Skin cancer.  Lung  cancer.  What should I know about heart disease, diabetes, and high blood pressure?  Blood pressure and heart disease  High blood pressure causes heart disease and increases the risk of stroke. This is more likely to develop in people who have high blood pressure readings or are overweight.  Talk with your health care provider about your target blood pressure readings.  Have your blood pressure checked:  Every 3-5 years if you are 24-52 years of age.  Every year if you are 3 years old or older.  If you are between the ages of 60 and 72 and are a current or former smoker, ask your health care provider if you should have a one-time screening for abdominal aortic aneurysm (AAA).  Diabetes  Have regular diabetes screenings. This checks your fasting blood sugar level. Have the screening done:  Once every three years after age 66 if you are at a normal weight and have a low risk for diabetes.  More often and at a younger age if you are overweight or have a high risk for diabetes.  What should I know about preventing infection?  Hepatitis B  If you have a higher risk for hepatitis B, you should be screened for this virus. Talk with your health care provider to find out if you are at risk for hepatitis B infection.  Hepatitis C  Blood testing is recommended for:  Everyone born from [birth year] through 1963.  Anyone  with known risk factors for hepatitis C.  Sexually transmitted infections (STIs)  You should be screened each year for STIs, including gonorrhea and chlamydia, if:  You are sexually active and are younger than 67 years of age.  You are older than 67 years of age and your health care provider tells you that you are at risk for this type of infection.  Your sexual activity has changed since you were last screened, and you are at increased risk for chlamydia or gonorrhea. Ask your health care provider if you are at risk.  Ask your health care provider about whether you are at high risk for HIV. Your health care provider  may recommend a prescription medicine to help prevent HIV infection. If you choose to take medicine to prevent HIV, you should first get tested for HIV. You should then be tested every 3 months for as long as you are taking the medicine.  Follow these instructions at home:  Alcohol use  Do not drink alcohol if your health care provider tells you not to drink.  If you drink alcohol:  Limit how much you have to 0-2 drinks a day.  Know how much alcohol is in your drink. In the [place] one drink equals one 12 oz bottle of beer (355 mL), one 5 oz glass of wine (148 mL), or one 1 oz glass of hard liquor (44 mL).  Lifestyle  Do not use any products that contain nicotine or tobacco. These products include cigarettes, chewing tobacco, and vaping devices, such as e-cigarettes. If you need help quitting, ask your health care provider.  Do not use street drugs.  Do not share needles.  Ask your health care provider for help if you need support or information about quitting drugs.  General instructions  Schedule regular health, dental, and eye exams.  Stay current with your vaccines.  Tell your health care provider if:  You often feel depressed.  You have ever been abused or do not feel safe at home.  Summary  Adopting a healthy lifestyle and getting preventive care are important in promoting health and wellness.  Follow your health care provider's instructions about healthy diet, exercising, and getting tested or screened for diseases.  Follow your health care provider's instructions on monitoring your cholesterol and blood pressure.  This information is not intended to replace advice given to you by your health care provider. Make sure you discuss any questions you have with your health care provider.  Document Revised: 04/28/2021 Document Reviewed: 04/28/2021  Elsevier Patient Education  2024 [employer].

## 2025-01-12 ENCOUNTER — Telehealth: Payer: Self-pay

## 2025-01-12 ENCOUNTER — Other Ambulatory Visit: Payer: Self-pay | Admitting: Internal Medicine

## 2025-01-12 ENCOUNTER — Ambulatory Visit

## 2025-01-12 ENCOUNTER — Telehealth: Payer: Self-pay | Admitting: Internal Medicine

## 2025-01-12 VITALS — Ht 70.0 in | Wt 230.0 lb

## 2025-01-12 DIAGNOSIS — F411 Generalized anxiety disorder: Secondary | ICD-10-CM

## 2025-01-12 DIAGNOSIS — Z0000 Encounter for general adult medical examination without abnormal findings: Secondary | ICD-10-CM | POA: Diagnosis not present

## 2025-01-12 LAB — TSH: TSH: 0.47 u[IU]/mL (ref 0.35–5.50)

## 2025-01-12 MED ORDER — ALPRAZOLAM 0.5 MG PO TABS: 0.5000 mg | ORAL_TABLET | Freq: Three times a day (TID) | ORAL | 0 refills | Status: AC | PRN
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
## 2025-01-12 NOTE — Telephone Encounter (Signed)
 Copied from CRM [id number]. Topic: Clinical - Medication Refill >> Jan 12, 2025 12:23 PM Rea C wrote: Medication: ALPRAZolam  (XANAX ) 0.5 MG tablet  Has the patient contacted their pharmacy? Yes (Agent: If no, request that the patient contact the pharmacy for the refill. If patient does not wish to contact the pharmacy document the reason why and proceed with request.) (Agent: If yes, when and what did the pharmacy advise?)  This is the patient's preferred pharmacy:  [id number] [address], [place] - [address] AT [facility] [address] [place] [address] Phone: [phone] Fax: [phone]  Is this the correct pharmacy for this prescription? Yes If no, delete pharmacy and type the correct one.   Has the prescription been filled recently? Yes  Is the patient out of the medication? Yes  Has the patient been seen for an appointment in the last year OR does the patient have an upcoming appointment? Yes  Can we respond through MyChart? Yes  Agent: Please be advised that Rx refills may take up to 3 business days. We ask that you follow-up with your pharmacy.

## 2025-01-12 NOTE — Patient Instructions (Signed)
 [name],  Thank you for taking the time for your Medicare Wellness Visit. I appreciate your continued commitment to your health goals. Please review the care plan we discussed, and feel free to reach out if I can assist you further.  Please note that Annual Wellness Visits do not include a physical exam. Some assessments may be limited, especially if the visit was conducted virtually. If needed, we may recommend an in-person follow-up with your provider.  Ongoing Care Seeing your primary care provider every 3 to 6 months helps us  monitor your health and provide consistent, personalized care. Last office visit on 01/11/2025.    Referrals If a referral was made during today's visit and you haven't received any updates within two weeks, please contact the referred provider directly to check on the status.  Recommended Screenings:  Health Maintenance  Topic Date Due   Eye exam for diabetics  Never done   Medicare Annual Wellness Visit  01/11/2025   Flu Shot  03/20/2025*   Zoster (Shingles) Vaccine (1 of 2) 04/11/2025*   DTaP/Tdap/Td vaccine (2 - Td or Tdap) 01/11/2026*   Colon Cancer Screening  01/11/2026*   Screening for Lung Cancer  03/02/2025   Hemoglobin A1C  07/11/2025   Yearly kidney function blood test for diabetes  12/04/2025   Kidney health urinalysis for diabetes  01/11/2026   Complete foot exam   01/11/2026   Pneumococcal Vaccine for age over 41  Completed   Hepatitis C Screening  Completed   Meningitis B Vaccine  Aged Out   COVID-19 Vaccine  Discontinued  *Topic was postponed. The date shown is not the original due date.       01/12/2025    1:46 PM  Advanced Directives  Does Patient Have a Medical Advance Directive? Yes  Type of Estate Agent of [place];Living will  Copy of Healthcare Power of Attorney in Chart? No - copy requested    Vision: Annual vision screenings are recommended for early detection of glaucoma, cataracts, and diabetic  retinopathy. These exams can also reveal signs of chronic conditions such as diabetes and high blood pressure.  Dental: Annual dental screenings help detect early signs of oral cancer, gum disease, and other conditions linked to overall health, including heart disease and diabetes.  Please see the attached documents for additional preventive care recommendations.

## 2025-01-12 NOTE — Telephone Encounter (Signed)
 Unable to reach [place]. LMTRC

## 2025-01-12 NOTE — Telephone Encounter (Signed)
 Medication refill request sent to [facility]

## 2025-01-12 NOTE — Telephone Encounter (Unsigned)
 Copied from CRM #[id number]. Topic: General - Other >> Jan 12, 2025  3:52 PM Deleta RAMAN wrote: Reason for CRM: returning calling from earlier. Will like a call following up

## 2025-01-12 NOTE — Progress Notes (Signed)
 "  Chief Complaint  Patient presents with   Medicare Wellness     Subjective:   Robert Church is a 69 y.o. male who presents for a Medicare Annual Wellness Visit.  Visit info / Clinical Intake: Medicare Wellness Visit Type:: Subsequent Annual Wellness Visit Persons participating in visit and providing information:: patient Medicare Wellness Visit Mode:: Telephone If telephone:: video declined Since this visit was completed virtually, some vitals may be partially provided or unavailable. Missing vitals are due to the limitations of the virtual format.: Unable to obtain vitals - no equipment If Telephone or Video please confirm:: I connected with patient using audio/video enable telemedicine. I verified patient identity with two identifiers, discussed telehealth limitations, and patient agreed to proceed. Patient Location:: Home Provider Location:: Office Interpreter Needed?: No Pre-visit prep was completed: yes AWV questionnaire completed by patient prior to visit?: yes Date:: 01/12/25 Living arrangements:: (!) (Patient-Rptd) lives alone Patient's Overall Health Status Rating: (!) (Patient-Rptd) poor Typical amount of pain: (!) (Patient-Rptd) a lot Does pain affect daily life?: (!) (Patient-Rptd) yes  Dietary Habits and Nutritional Risks How many meals a day?: (Patient-Rptd) 3 Eats fruit and vegetables daily?: (!) (Patient-Rptd) no Most meals are obtained by: (Patient-Rptd) eating out In the last 2 weeks, have you had any of the following?: (!) nausea, vomiting, diarrhea (nausea) Diabetic:: (!) yes Any non-healing wounds?: no How often do you check your BS?: as needed (once a month) Would you like to be referred to a Nutritionist or for Diabetic Management? : no  Functional Status Activities of Daily Living (to include ambulation/medication): (!) (Patient-Rptd) Needs Assist Ambulation: Independent with device- listed below Home Assistive Devices/Equipment: Johna Finder (specify  Type) Medication Administration: (Patient-Rptd) Independent Home Management (perform basic housework or laundry): (Patient-Rptd) Needs assistance (comment) Manage your own finances?: (Patient-Rptd) yes Primary transportation is: (Patient-Rptd) driving Concerns about vision?: no *vision screening is required for WTM* Concerns about hearing?: no  Fall Screening Falls in the past year?: (Patient-Rptd) 1 Number of falls in past year: (Patient-Rptd) 0 Was there an injury with Fall?: (Patient-Rptd) 0 Fall Risk Category Calculator: (Patient-Rptd) 1 Patient Fall Risk Level: (Patient-Rptd) Low Fall Risk  Fall Risk Patient at Risk for Falls Due to: Impaired balance/gait Fall risk Follow up: Falls evaluation completed; Falls prevention discussed  Home and Transportation Safety: All rugs have non-skid backing?: (Patient-Rptd) N/A, no rugs All stairs or steps have railings?: (Patient-Rptd) N/A, no stairs Grab bars in the bathtub or shower?: (!) (Patient-Rptd) no Have non-skid surface in bathtub or shower?: (!) (Patient-Rptd) no Good home lighting?: (Patient-Rptd) yes Regular seat belt use?: (Patient-Rptd) yes Hospital stays in the last year:: (Patient-Rptd) no  Cognitive Assessment Difficulty concentrating, remembering, or making decisions? : (Patient-Rptd) no Will 6CIT or Mini Cog be Completed: no 6CIT or Mini Cog Declined: patient alert, oriented, able to answer questions appropriately and recall recent events  Advance Directives (For Healthcare) Does Patient Have a Medical Advance Directive?: Yes Type of Advance Directive: Healthcare Power of [place]; Living will Copy of Healthcare Power of Attorney in Chart?: No - copy requested Copy of Living Will in Chart?: No - copy requested  Reviewed/Updated  Reviewed/Updated: Reviewed All (Medical, Surgical, Family, Medications, Allergies, Care Teams, Patient Goals)    Allergies (verified) Bee venom, Cefdinir , Spironolactone , Bactrim   [sulfamethoxazole -trimethoprim ], Daliresp  [roflumilast ], Jardiance  [empagliflozin ], Penicillins, Clindamycin, Doxycycline , E-mycin [erythromycin base], Levaquin  [levofloxacin ], and Rosuvastatin   Current Medications (verified) Outpatient Encounter Medications as of 01/12/2025  Medication Sig   albuterol  (PROVENTIL ) (2.5 MG/3ML) 0.083% nebulizer  solution Take 3 mLs (2.5 mg total) by nebulization every 6 (six) hours as needed for wheezing or shortness of breath.   allopurinol  (ZYLOPRIM ) 300 MG tablet TAKE 1 TABLET(300 MG) BY MOUTH DAILY   ALPRAZolam  (XANAX ) 0.5 MG tablet Take 1 tablet (0.5 mg total) by mouth 3 (three) times daily as needed for anxiety.   azelastine  (ASTELIN ) 0.1 % nasal spray Place 2 sprays into both nostrils 2 (two) times daily. Use in each nostril as directed   B Complex-C-Biotin-D-Zinc-FA (VITAL-D RX PO) Take 1 tablet by mouth daily.   baclofen  (LIORESAL ) 10 MG tablet Take 10 mg by mouth as needed for muscle spasms.   DALIRESP  500 MCG TABS tablet Take 500 mcg by mouth daily.   diclofenac Sodium (VOLTAREN) 1 % GEL Apply 1 Application topically in the morning and at bedtime.   DULERA  100-5 MCG/ACT AERO INHALE 2 PUFFS INTO THE LUNGS TWICE DAILY   EPINEPHrine  0.3 mg/0.3 mL IJ SOAJ injection Inject 0.3 mg into the muscle as needed for anaphylaxis.   fluticasone  (FLONASE ) 50 MCG/ACT nasal spray SHAKE LIQUID AND USE 2 SPRAYS IN EACH NOSTRIL DAILY   guaiFENesin  (MUCINEX ) 600 MG 12 hr tablet Take 600 mg by mouth as needed.   ipratropium (ATROVENT ) 0.06 % nasal spray Place 2 sprays into both nostrils 4 (four) times daily as needed for rhinitis.   loratadine (CLARITIN) 10 MG tablet Take 10 mg by mouth every other day. As needed   losartan  (COZAAR ) 25 MG tablet Take 0.5 tablets (12.5 mg total) by mouth at bedtime.   methocarbamol  (ROBAXIN ) 500 MG tablet Take 1 tablet (500 mg total) by mouth every 8 (eight) hours as needed for muscle spasms.   metolazone  (ZAROXOLYN ) 2.5 MG tablet TAKE 1  TABLET BY MOUTH 2 TIMES A WEEK ON MONDAY AND FRIDAY   montelukast  (SINGULAIR ) 10 MG tablet TAKE 1 TABLET BY MOUTH EVERY DAY AT BEDTIME   nicotine  (NICODERM CQ ) 14 mg/24hr patch Place 1 patch (14 mg total) onto the skin daily.   omeprazole  (PRILOSEC) 20 MG capsule TAKE 1 CAPSULE(20 MG) BY MOUTH TWICE DAILY BEFORE A MEAL FOR STOMACH ACID OR REFLUX   ondansetron  (ZOFRAN -ODT) 4 MG disintegrating tablet Take 1 tablet (4 mg total) by mouth every 8 (eight) hours as needed for nausea or vomiting.   oxyCODONE  (OXY IR/ROXICODONE ) 5 MG immediate release tablet Take 1 tablet (5 mg total) by mouth every 8 (eight) hours as needed for severe pain (pain score 7-10).   potassium chloride  SA (KLOR-CON  M) 20 MEQ tablet Take 40 mEq by mouth daily.   predniSONE  (DELTASONE ) 10 MG tablet TAKE 4 TABLETS BY MOUTH DAILY FOR 3DAYS, THEN 3 TABLETS BY MOUTH DAILY FOR 3DAYS, 2 TABLETS FOR 3 DAYS, THEN BACK TO 10MG  EVERY DAY   roflumilast  (DALIRESP ) 500 MCG TABS tablet Take 1 tablet (500 mcg total) by mouth daily.   Tiotropium Bromide  (SPIRIVA  RESPIMAT) 2.5 MCG/ACT AERS Inhale 2 puffs into the lungs daily.   torsemide  (DEMADEX ) 20 MG tablet Take 2 tablets (40 mg total) by mouth daily.   VENTOLIN  HFA 108 (90 Base) MCG/ACT inhaler INHALE 2 PUFFS INTO THE LUNGS EVERY 6 HOURS AS NEEDED FOR WHEEZING   vitamin B-12 (CYANOCOBALAMIN ) 1000 MCG tablet Take 1,000 mcg by mouth daily.   VITAMIN D , CHOLECALCIFEROL, PO Take 1 tablet by mouth daily.   vitamin E 180 MG (400 UNITS) capsule Take 400 Units by mouth daily.   mometasone -formoterol  (DULERA ) 200-5 MCG/ACT AERO Inhale 2 puffs into the lungs 2 (  two) times daily. (Patient not taking: Reported on 01/11/2025)   No facility-administered encounter medications on file as of 01/12/2025.    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 INVASIVE CV LAB;  Service: Cardiovascular;  Laterality: N/A;   PACEMAKER IMPLANT N/A 04/16/2021   Procedure: PACEMAKER IMPLANT;  Surgeon: Inocencio Soyla Lunger, MD;  Location: [facility] INVASIVE CV LAB;  Service: Cardiovascular;  Laterality: N/A;   RIGHT HEART CATH N/A 03/09/2023   Procedure: RIGHT HEART CATH;  Surgeon: Cherrie Toribio SAUNDERS, MD;  Location: [facility]VAS[facility] CV [facility];  Service: Cardiovascular;  Laterality: N/A;   RIGHT/LEFT HEART CATH AND CORONARY ANGIOGRAPHY N/A 08/22/2021   Procedure: RIGHT/LEFT HEART CATH AND CORONARY ANGIOGRAPHY;  Surgeon: Cherrie Toribio SAUNDERS, MD;  Location: [facility] INVASIVE CV LAB;  Service: Cardiovascular;  Laterality: N/A;   TONSILLECTOMY     Family History  Problem Relation Age of Onset   Lung cancer Father    High blood pressure Mother    Diabetes Maternal Grandmother    Colon polyps Neg Hx    Esophageal cancer Neg Hx    Pancreatic cancer Neg Hx    Stomach cancer Neg Hx    Social History   Occupational History   Occupation: disabled  Tobacco Use   Smoking status: Some Days    Current  packs/day: 0.00    Average packs/day: 2.0 packs/day for 46.0 years (92.0 ttl pk-yrs)    Types: Cigarettes    Start date: 01/28/1976    Last attempt to quit: 01/27/2022    Years since quitting: 2.9    Passive exposure: Past   Smokeless tobacco: Never   Tobacco comments:    Chews on a cigar.  09/16/2023 hfb        Smokes one occasionally per pt. 03/30/24  Vaping Use   Vaping status: Never Used  Substance and Sexual Activity   Alcohol use: Yes    Alcohol/week: 28.0 standard drinks of alcohol    Types: 28 Cans of beer per week   Drug use: No   Sexual activity: Not Currently    Partners: Female    Birth control/protection: None   Tobacco Counseling Ready to quit: Not Answered Counseling given: Not Answered Tobacco comments: Chews on a cigar.  09/16/2023 hfb  Smokes one occasionally per pt. 03/30/24  SDOH Screenings   Food Insecurity: No Food Insecurity (01/12/2025)  Housing: High Risk (01/12/2025)  Transportation Needs: No Transportation Needs (01/12/2025)  Utilities: At Risk (01/12/2025)  Alcohol Screen: Low Risk (10/10/2024)  Depression (PHQ2-9): Medium Risk (01/12/2025)  Financial Resource Strain: Medium Risk (10/10/2024)  Physical Activity: Inactive (01/12/2025)  Social Connections: Socially Isolated (01/12/2025)  Stress: Stress Concern Present (01/12/2025)  Tobacco Use: High Risk (01/12/2025)  Health Literacy: Adequate Health Literacy (01/12/2025)   See flowsheets for full screening details  Depression Screen PHQ 2 & 9 Depression Scale- Over the past 2 weeks, how often have you been bothered by any of the following problems? Little interest or pleasure in doing things: 0 Feeling down, depressed, or hopeless (PHQ Adolescent also includes...irritable): 0 PHQ-2 Total Score: 0 Trouble falling or staying asleep, or sleeping too much: 3 (staying asleep) Feeling tired or having little energy: 3 (due to his condition) Poor appetite or overeating (PHQ Adolescent also includes...weight  loss): 0 Feeling bad about yourself - or that you are a failure or have let yourself or your family down: 0 Trouble concentrating on things, such as reading the newspaper or watching television (PHQ Adolescent also includes...like school work): 0 Moving or speaking so slowly that other people could have noticed. Or the opposite - being so fidgety or restless that you have been moving around a lot more than usual: 0 Thoughts that you would be better off dead, or of hurting yourself in some way: 0 PHQ-9 Total Score: 6 If you checked off any problems, how difficult have these problems made it for you to do your work, take care of things at home, or get along with other people?: Not difficult at all  Depression Treatment Depression Interventions/Treatment : EYV7-0 Score <4 Follow-up Not Indicated     Goals Addressed   None          Objective:    Today's Vitals   01/12/25 1553  Weight: 230 lb (104.3 kg)  Height: 5' 10 (1.778 m)   Body mass index is 33 kg/m.  Hearing/Vision screen Hearing Screening - Comments:: Has ringing in the ears-per pt  Vision Screening - Comments:: Has some eye issues-per pt/not UTD/no provider Immunizations and Health Maintenance Health Maintenance  Topic Date Due   OPHTHALMOLOGY EXAM  Never done   Influenza Vaccine  03/20/2025 (Originally 07/21/2024)   Zoster Vaccines- Shingrix  (1 of 2) 04/11/2025 (Originally 08/21/1977)   DTaP/Tdap/Td (2 - Td or Tdap) 01/11/2026 (Originally 12/02/2021)   Colonoscopy  01/11/2026 (Originally 12/10/2019)   Lung Cancer Screening  03/02/2025   HEMOGLOBIN A1C  07/11/2025   Diabetic kidney evaluation - eGFR measurement  12/04/2025   Diabetic kidney evaluation - Urine ACR  01/11/2026   FOOT EXAM  01/11/2026   Medicare Annual Wellness (AWV)  01/12/2026   Pneumococcal Vaccine: 50+ Years  Completed   Hepatitis C Screening  Completed   Meningococcal B Vaccine  Aged Out   COVID-19 Vaccine  Discontinued         Assessment/Plan:  This is a routine wellness examination for Robert Church.  Patient Care Team: Joshua Debby CROME, MD as PCP - General (Internal Medicine) Court Dorn PARAS, MD as PCP - Cardiology (Cardiology) Inocencio Soyla Lunger, MD as PCP - Electrophysiology (Cardiology) Merceda Lela SAUNDERS, RPH-CPP (Pharmacist) Parrett, Madelin RAMAN, NP as Nurse Practitioner (Pulmonary Disease)  I have personally reviewed and noted the following in the patients chart:   Medical and social history Use of alcohol, tobacco or illicit drugs  Current medications  and supplements including opioid prescriptions. Functional ability and status Nutritional status Physical activity Advanced directives List of other physicians Hospitalizations, surgeries, and ER visits in previous 12 months Vitals Screenings to include cognitive, depression, and falls Referrals and appointments  No orders of the defined types were placed in this encounter.  In addition, I have reviewed and discussed with patient certain preventive protocols, quality metrics, and best practice recommendations. A written personalized care plan for preventive services as well as general preventive health recommendations were provided to patient.   Naeema Patlan L Deatrice Spanbauer, CMA   01/12/2025   Return in 1 year (on 01/12/2026).  After Visit Summary: (MyChart) Due to this being a telephonic visit, the after visit summary with patients personalized plan was offered to patient via MyChart   Nurse Notes: Patient is due for a diabetic eye exam.  He states that he is in need of a medication refill for Alprazolam  0.5 mg TID.  Patient stated that he forgot to ask for it yesterday during his office visit but he did call back up here before office had closed and did not get an answer.  Patient then stated that he sent a mychart message as well today and called this morning.  I informed patient that I would send a message to DOD because Dr. Joshua in [place] today.   "

## 2025-01-12 NOTE — Progress Notes (Signed)
 Complex Care Management Note Care Guide Note  01/12/2025 Name: Robert Church MRN: [id number] DOB: 07-13-1956   Complex Care Management Outreach Attempts: An unsuccessful telephone outreach was attempted today to offer the patient information about available complex care management services.  Follow Up Plan:  Additional outreach attempts will be made to offer the patient complex care management information and services.   Encounter Outcome:  No Answer  [facility]  [facility], [facility] Guide Direct Dial: [phone]  Fax: [phone]

## 2025-01-12 NOTE — Telephone Encounter (Signed)
 Rx sent.

## 2025-01-13 ENCOUNTER — Other Ambulatory Visit: Payer: Self-pay | Admitting: Internal Medicine

## 2025-01-13 DIAGNOSIS — K7030 Alcoholic cirrhosis of liver without ascites: Secondary | ICD-10-CM

## 2025-01-13 MED ORDER — ALLOPURINOL 300 MG PO TABS: 300.0000 mg | ORAL_TABLET | Freq: Every day | ORAL | 0 refills | Status: AC

## 2025-01-13 MED ORDER — VITAMIN B-1 50 MG PO TABS: 50.0000 mg | ORAL_TABLET | Freq: Every day | ORAL | 1 refills | Status: AC

## 2025-01-15 ENCOUNTER — Telehealth: Payer: Self-pay | Admitting: Internal Medicine

## 2025-01-15 NOTE — Progress Notes (Unsigned)
 Complex Care Management Note Care Guide Note  01/15/2025 Name: Robert Church MRN: [id number] DOB: 06-11-1956   Complex Care Management Outreach Attempts: An unsuccessful telephone outreach was attempted today to offer the patient information about available complex care management services.  Follow Up Plan:  Additional outreach attempts will be made to offer the patient complex care management information and services.   Encounter Outcome:  No Answer  [facility]ce [facility]  [facility], [facility] Guide Direct Dial: [phone]  Fax: [phone]

## 2025-01-15 NOTE — Progress Notes (Unsigned)
 Complex Care Management Note Care Guide Note  01/15/2025 Name: Robert Church MRN: [id number] DOB: 05-01-1956   Complex Care Management Outreach Attempts: A second unsuccessful outreach was attempted today to offer the patient with information about available complex care management services.  Follow Up Plan:  Additional outreach attempts will be made to offer the patient complex care management information and services.   Encounter Outcome:  No Answer  [facility]  [facility], [facility] Guide Direct Dial: [phone]  Fax: [phone]

## 2025-01-16 ENCOUNTER — Ambulatory Visit: Payer: Self-pay | Admitting: Internal Medicine

## 2025-01-16 NOTE — Telephone Encounter (Signed)
 Unable to reach [place]. LMTRC. Closing the telephone encounter due to me trying 4 times to reach her and still no returned call. If she calls please reopen the encounter.

## 2025-01-16 NOTE — Progress Notes (Signed)
 Complex Care Management Note  Care Guide Note 01/16/2025 Name: LOY MCCARTT MRN: [id number] DOB: 01-09-1956  ZEVIN NEVARES is a 69 y.o. year old male who sees Joshua Debby CROME, MD for primary care. I reached out to [name] by phone today to offer complex care management services.  Mr. Mccard was given information about Complex Care Management services today including:   The Complex Care Management services include support from the care team which includes your Nurse Care Manager, Clinical Social Worker, or Pharmacist.  The Complex Care Management team is here to help remove barriers to the health concerns and goals most important to you. Complex Care Management services are voluntary, and the patient may decline or stop services at any time by request to their care team member.   Complex Care Management Consent Status: Patient agreed to services and verbal consent obtained.   Follow up plan:  Telephone appointment with complex care management team member scheduled for:  01/22/2025 and 01/29/2025  Encounter Outcome:  Patient Scheduled  Doyce Razor [facility]  [facility], [facility] Guide Direct Dial: [phone]  Fax: [phone]

## 2025-01-16 NOTE — Telephone Encounter (Signed)
 Pt. Scheduled

## 2025-01-18 ENCOUNTER — Encounter: Payer: Self-pay | Admitting: Acute Care

## 2025-01-19 ENCOUNTER — Telehealth: Payer: Self-pay

## 2025-01-19 ENCOUNTER — Telehealth: Payer: Self-pay | Admitting: Internal Medicine

## 2025-01-19 NOTE — Telephone Encounter (Unsigned)
 Copied from CRM [id number]. Topic: Clinical - Medical Advice >> Jan 19, 2025  1:23 PM Dedra B wrote: Reason for CRM: Patient has a cold and would like to know if it's safe to take Symbicort  with the medications he normally takes. He prefers to speak with Marcus but is willing to speak with any nurse if she isn't available. Please advise patient.

## 2025-01-19 NOTE — Telephone Encounter (Signed)
 Please advise this message but he's not talking about symbicort  he want to know if he can take SAMBUCOL fir the cold he has in his chest and the SOB that he has been having.

## 2025-01-22 ENCOUNTER — Other Ambulatory Visit: Payer: Self-pay

## 2025-01-22 ENCOUNTER — Other Ambulatory Visit: Payer: Self-pay | Admitting: Internal Medicine

## 2025-01-22 DIAGNOSIS — K7030 Alcoholic cirrhosis of liver without ascites: Secondary | ICD-10-CM

## 2025-01-22 DIAGNOSIS — F1010 Alcohol abuse, uncomplicated: Secondary | ICD-10-CM

## 2025-01-22 MED ORDER — NALTREXONE HCL 50 MG PO TABS: 50.0000 mg | ORAL_TABLET | Freq: Every day | ORAL | 1 refills | Status: AC

## 2025-01-22 NOTE — Patient Outreach (Addendum)
 LCSW called patient for initial visit. LCSW discussed the reason for referral. Patient explained that he is seeking medication for his alcohol use. LCSW asked if patient would be able to attend an outpt treatment center or therapy.  Patient explained that it is very difficult for him to leave his home. LCSW informed patient that LCSW will follow up with PCP and alerted them of patient wishes. Patient has declined VBCI services at this time.   Olam Ally, MSW, LCSW [place]  [facility], [facility] Licensed Clinical Social Worker Direct Dial: [phone]

## 2025-01-23 ENCOUNTER — Telehealth: Payer: Self-pay

## 2025-01-23 NOTE — Telephone Encounter (Signed)
 Called and spoke with the pt. Pt had questions if he should use albuterol  neb. Pt had ov with primary last week and picked up virus.  Pt states he is currently doxy & prednisone  that he had laying around the house. Pt complains of yellow phlegm that started this morning with head & chest congestion.  No fever but chest tightness is present due to him coughing very heavy. States he cannot take Levaquin .  He is requesting something stronger to get rid of his virus.  Beth can you please advise.

## 2025-01-23 NOTE — Telephone Encounter (Signed)
 Per encounter 01/19/2025  Me    01/23/25  9:33 AM Note ATC x1. LMTCB regarding medication concerns.      [place] , Jazunique M, CMA to Me    01/19/25  2:41 PM Note Please advise this message but he's not talking about symbicort  he want to know if he can take SAMBUCOL fir the cold he has in his chest and the SOB that he has been having.         01/19/25  2:08 PM Stub[place]Barnette, Cleatrice ORN, CMA routed this conversation to [facility] Nurse    01/19/25  1:36 PM Honora Manfred CROME, RN routed this conversation to [facility]  Honora Manfred CROME, RN    01/19/25  1:36 PM Unsigned Note Copied from CRM [id number]. Topic: Clinical - Medical Advice >> Jan 19, 2025  1:23 PM Dedra B wrote: Reason for CRM: Patient has a cold and would like to know if it's safe to take Symbicort  with the medications he normally takes. He prefers to speak with Marcus but is willing to speak with any nurse if she isn't available. Please advise patient.         I ATC x2. No answer LMTCB

## 2025-01-23 NOTE — Telephone Encounter (Signed)
 See separate encounter. Spoke with the pt. NFN

## 2025-01-24 NOTE — Telephone Encounter (Signed)
 Atc x1.LMTCB

## 2025-01-24 NOTE — Telephone Encounter (Signed)
 He needs a full prednisone  taper as below: Take 40mg  daily for 3 days, then 30mg  daily for 3 days, then 20mg  daily for 3 days, then 10mg  daily for 3 days, then stop  Doxycycline  is adequate to cover, need to make sure that he has enough or 100 mg twice daily for 7 days.

## 2025-01-24 NOTE — Telephone Encounter (Signed)
 Received incoming call from the pt and he was following up on his request.  Dr. Shelah please advise pt is requesting stronger rx.

## 2025-01-24 NOTE — Telephone Encounter (Signed)
 See 2/3 telephone encounter. I have spoken with the pt and routed to [facility] to advise.

## 2025-01-25 ENCOUNTER — Other Ambulatory Visit: Payer: Self-pay

## 2025-01-25 MED ORDER — PREDNISONE 10 MG PO TABS: ORAL_TABLET | ORAL | 0 refills | Status: AC

## 2025-01-25 MED ORDER — DOXYCYCLINE HYCLATE 100 MG PO TABS: 100.0000 mg | ORAL_TABLET | Freq: Two times a day (BID) | ORAL | 0 refills | Status: AC

## 2025-01-25 NOTE — Telephone Encounter (Signed)
 Spoke with pt and rxs sent to pharmacy as pt states he does not have enough meds to complete course

## 2025-01-25 NOTE — Telephone Encounter (Signed)
 Atc x2. LMTCB

## 2025-01-29 ENCOUNTER — Other Ambulatory Visit

## 2025-02-07 ENCOUNTER — Other Ambulatory Visit

## 2025-02-12 ENCOUNTER — Ambulatory Visit: Admitting: Internal Medicine

## 2025-02-12 ENCOUNTER — Ambulatory Visit: Admitting: Cardiovascular Disease

## 2025-02-13 ENCOUNTER — Ambulatory Visit: Payer: Self-pay

## 2025-04-11 ENCOUNTER — Ambulatory Visit: Admitting: Emergency Medicine

## 2025-05-15 ENCOUNTER — Ambulatory Visit: Payer: Self-pay

## 2025-08-14 ENCOUNTER — Ambulatory Visit: Payer: Self-pay

## 2025-11-13 ENCOUNTER — Ambulatory Visit: Payer: Self-pay

## 2026-02-12 ENCOUNTER — Ambulatory Visit: Payer: Self-pay

## 2026-05-14 ENCOUNTER — Ambulatory Visit: Payer: Self-pay

## 2026-08-13 ENCOUNTER — Ambulatory Visit: Payer: Self-pay
